# Patient Record
Sex: Female | Born: 1945
Health system: Southern US, Community
[De-identification: ages and names within clinical notes are randomized; demographics above are authoritative.]

## PROBLEM LIST (undated history)

## (undated) DIAGNOSIS — J3089 Other allergic rhinitis: Secondary | ICD-10-CM

## (undated) DIAGNOSIS — L600 Ingrowing nail: Secondary | ICD-10-CM

## (undated) DIAGNOSIS — G4730 Sleep apnea, unspecified: Secondary | ICD-10-CM

## (undated) DIAGNOSIS — R05 Cough: Secondary | ICD-10-CM

## (undated) DIAGNOSIS — K219 Gastro-esophageal reflux disease without esophagitis: Secondary | ICD-10-CM

## (undated) DIAGNOSIS — I10 Essential (primary) hypertension: Secondary | ICD-10-CM

## (undated) DIAGNOSIS — E7800 Pure hypercholesterolemia, unspecified: Secondary | ICD-10-CM

## (undated) DIAGNOSIS — I729 Aneurysm of unspecified site: Secondary | ICD-10-CM

## (undated) DIAGNOSIS — K5900 Constipation, unspecified: Secondary | ICD-10-CM

## (undated) DIAGNOSIS — M7661 Achilles tendinitis, right leg: Secondary | ICD-10-CM

## (undated) DIAGNOSIS — G459 Transient cerebral ischemic attack, unspecified: Secondary | ICD-10-CM

## (undated) DIAGNOSIS — I2510 Atherosclerotic heart disease of native coronary artery without angina pectoris: Secondary | ICD-10-CM

## (undated) DIAGNOSIS — E785 Hyperlipidemia, unspecified: Secondary | ICD-10-CM

## (undated) DIAGNOSIS — K220 Achalasia of cardia: Secondary | ICD-10-CM

## (undated) DIAGNOSIS — K449 Diaphragmatic hernia without obstruction or gangrene: Secondary | ICD-10-CM

## (undated) DIAGNOSIS — M1712 Unilateral primary osteoarthritis, left knee: Secondary | ICD-10-CM

## (undated) DIAGNOSIS — R42 Dizziness and giddiness: Secondary | ICD-10-CM

## (undated) DIAGNOSIS — D649 Anemia, unspecified: Secondary | ICD-10-CM

## (undated) DIAGNOSIS — K222 Esophageal obstruction: Secondary | ICD-10-CM

## (undated) DIAGNOSIS — R202 Paresthesia of skin: Secondary | ICD-10-CM

## (undated) DIAGNOSIS — K4090 Unilateral inguinal hernia, without obstruction or gangrene, not specified as recurrent: Secondary | ICD-10-CM

## (undated) DIAGNOSIS — R51 Headache: Secondary | ICD-10-CM

## (undated) DIAGNOSIS — G43909 Migraine, unspecified, not intractable, without status migrainosus: Secondary | ICD-10-CM

## (undated) DIAGNOSIS — B351 Tinea unguium: Secondary | ICD-10-CM

## (undated) DIAGNOSIS — J40 Bronchitis, not specified as acute or chronic: Secondary | ICD-10-CM

## (undated) DIAGNOSIS — E876 Hypokalemia: Secondary | ICD-10-CM

## (undated) DIAGNOSIS — K5790 Diverticulosis of intestine, part unspecified, without perforation or abscess without bleeding: Secondary | ICD-10-CM

## (undated) HISTORY — DX: Headache: R51

## (undated) HISTORY — DX: Migraine, unspecified, not intractable, without status migrainosus: G43.909

## (undated) HISTORY — DX: Unilateral primary osteoarthritis, left knee: M17.12

## (undated) HISTORY — DX: Tinea unguium: B35.1

## (undated) HISTORY — PX: ABDOMINAL HYSTERECTOMY: SHX81

## (undated) HISTORY — PX: CARPAL TUNNEL RELEASE: SHX101

## (undated) HISTORY — DX: Pure hypercholesterolemia, unspecified: E78.00

## (undated) HISTORY — PX: BACK SURGERY: SHX140

## (undated) HISTORY — DX: Achilles tendinitis, right leg: M76.61

## (undated) HISTORY — PX: HEMORRHOID SURGERY: SHX153

## (undated) HISTORY — DX: Essential (primary) hypertension: I10

## (undated) HISTORY — DX: Gastro-esophageal reflux disease without esophagitis: K21.9

## (undated) HISTORY — DX: Anemia, unspecified: D64.9

## (undated) HISTORY — PX: CERVICAL SPINE SURGERY: SHX589

## (undated) HISTORY — DX: Achalasia of cardia: K22.0

## (undated) HISTORY — DX: Cough: R05

## (undated) HISTORY — DX: Bronchitis, not specified as acute or chronic: J40

## (undated) HISTORY — DX: Diaphragmatic hernia without obstruction or gangrene: K44.9

## (undated) HISTORY — DX: Esophageal obstruction: K22.2

## (undated) HISTORY — DX: Unilateral inguinal hernia, without obstruction or gangrene, not specified as recurrent: K40.90

## (undated) HISTORY — DX: Aneurysm of unspecified site: I72.9

## (undated) HISTORY — DX: Atherosclerotic heart disease of native coronary artery without angina pectoris: I25.10

## (undated) HISTORY — DX: Ingrowing nail: L60.0

## (undated) HISTORY — DX: Constipation, unspecified: K59.00

## (undated) HISTORY — DX: Hyperlipidemia, unspecified: E78.5

## (undated) HISTORY — DX: Diverticulosis of intestine, part unspecified, without perforation or abscess without bleeding: K57.90

## (undated) HISTORY — DX: Paresthesia of skin: R20.2

## (undated) HISTORY — DX: Hypokalemia: E87.6

## (undated) HISTORY — DX: Other allergic rhinitis: J30.89

## (undated) HISTORY — DX: Dizziness and giddiness: R42

---

## 1997-06-21 ENCOUNTER — Encounter: Admission: RE | Admit: 1997-06-21 | Discharge: 1997-09-19 | Payer: Self-pay | Admitting: Neurosurgery

## 1997-07-27 ENCOUNTER — Inpatient Hospital Stay (HOSPITAL_COMMUNITY): Admission: RE | Admit: 1997-07-27 | Discharge: 1997-08-02 | Payer: Self-pay | Admitting: Neurosurgery

## 1997-09-27 ENCOUNTER — Ambulatory Visit (HOSPITAL_COMMUNITY): Admission: RE | Admit: 1997-09-27 | Discharge: 1997-09-27 | Payer: Self-pay | Admitting: Neurosurgery

## 1997-10-11 ENCOUNTER — Encounter: Payer: Self-pay | Admitting: Neurosurgery

## 1997-10-11 ENCOUNTER — Ambulatory Visit (HOSPITAL_COMMUNITY): Admission: RE | Admit: 1997-10-11 | Discharge: 1997-10-11 | Payer: Self-pay | Admitting: Neurosurgery

## 1999-06-14 ENCOUNTER — Encounter: Admission: RE | Admit: 1999-06-14 | Discharge: 1999-06-14 | Payer: Self-pay | Admitting: Neurosurgery

## 1999-06-14 ENCOUNTER — Encounter: Payer: Self-pay | Admitting: Neurosurgery

## 2001-04-22 ENCOUNTER — Inpatient Hospital Stay (HOSPITAL_COMMUNITY): Admission: RE | Admit: 2001-04-22 | Discharge: 2001-04-23 | Payer: Self-pay | Admitting: Neurosurgery

## 2001-04-22 ENCOUNTER — Encounter: Payer: Self-pay | Admitting: Neurosurgery

## 2003-05-24 ENCOUNTER — Encounter: Admission: RE | Admit: 2003-05-24 | Discharge: 2003-05-24 | Payer: Self-pay | Admitting: Neurosurgery

## 2003-06-15 ENCOUNTER — Encounter: Admission: RE | Admit: 2003-06-15 | Discharge: 2003-06-15 | Payer: Self-pay | Admitting: Neurosurgery

## 2009-12-27 ENCOUNTER — Ambulatory Visit (HOSPITAL_COMMUNITY): Admission: RE | Admit: 2009-12-27 | Discharge: 2009-12-28 | Payer: Self-pay | Admitting: Neurosurgery

## 2010-01-27 ENCOUNTER — Emergency Department (HOSPITAL_COMMUNITY)
Admission: EM | Admit: 2010-01-27 | Discharge: 2010-01-27 | Payer: Self-pay | Source: Home / Self Care | Admitting: Emergency Medicine

## 2010-02-02 ENCOUNTER — Ambulatory Visit (HOSPITAL_COMMUNITY)
Admission: RE | Admit: 2010-02-02 | Discharge: 2010-02-02 | Payer: Self-pay | Source: Home / Self Care | Attending: Neurosurgery | Admitting: Neurosurgery

## 2010-02-21 ENCOUNTER — Ambulatory Visit (HOSPITAL_COMMUNITY)
Admission: RE | Admit: 2010-02-21 | Discharge: 2010-02-21 | Payer: Self-pay | Source: Home / Self Care | Attending: Neurosurgery | Admitting: Neurosurgery

## 2010-02-21 LAB — BASIC METABOLIC PANEL
BUN: 11 mg/dL (ref 6–23)
CO2: 35 mEq/L — ABNORMAL HIGH (ref 19–32)
Calcium: 9.5 mg/dL (ref 8.4–10.5)
Chloride: 99 mEq/L (ref 96–112)
Creatinine, Ser: 0.8 mg/dL (ref 0.4–1.2)
GFR calc Af Amer: >60 mL/min (ref >60)
GFR calc non Af Amer: >60 mL/min (ref >60)
Glucose, Bld: 97 mg/dL (ref 70–99)
Potassium: 3.1 mEq/L — ABNORMAL LOW (ref 3.5–5.1)
Sodium: 142 mEq/L (ref 135–145)

## 2010-02-21 LAB — URINALYSIS, ROUTINE W REFLEX MICROSCOPIC
Bilirubin Urine: NEGATIVE
Hemoglobin, Urine: NEGATIVE
Ketones, ur: NEGATIVE mg/dL
Nitrite: NEGATIVE
Protein, ur: NEGATIVE mg/dL
Specific Gravity, Urine: 1.02 (ref 1.005–1.030)
Urine Glucose, Fasting: NEGATIVE mg/dL
Urobilinogen, UA: 1 mg/dL (ref 0.0–1.0)
pH: 8 (ref 5.0–8.0)

## 2010-03-10 NOTE — H&P (Signed)
  NAMEKILEA, MCCAREY              ACCOUNT NO.:  [id number]  MEDICAL RECORD NO.:  [id number]          PATIENT TYPE:  OUT  LOCATION:  MLAB                         FACILITY:  [facility]  PHYSICIAN:  Coletta Memos, M.D.     DATE OF BIRTH:  [birth year]  DATE OF ADMISSION:  02/21/2010 DATE OF DISCHARGE:  02/21/2010                             HISTORY & PHYSICAL   Ms. Redondo was never admitted.  She was supposed to undergo an operation but told me in the holding room that she was feeling better.  She is moving all extremities well.  Her physical exam neurologically was normal and intact.  Again, she was not admitted to the hospital.  She did undergo operation which was cancelled in the holding area.          ______________________________ Coletta Memos, M.D.     KC/MEDQ  D:  02/28/2010  T:  03/01/2010  Job:  [id number]  Electronically Signed by Coletta Memos M.D. on 03/10/2010 10:47:50 AM

## 2010-03-11 ENCOUNTER — Encounter: Payer: Self-pay | Admitting: Neurosurgery

## 2010-04-30 LAB — CBC
HCT: 35.9 % — ABNORMAL LOW (ref 36.0–46.0)
Hemoglobin: 11.8 g/dL — ABNORMAL LOW (ref 12.0–15.0)
MCH: 28.7 pg (ref 26.0–34.0)
MCHC: 32.9 g/dL (ref 30.0–36.0)
MCV: 87.3 fL (ref 78.0–100.0)
Platelets: 323 10*3/uL (ref 150–400)
RBC: 4.11 MIL/uL (ref 3.87–5.11)
RDW: 14.6 % (ref 11.5–15.5)
WBC: 7.4 10*3/uL (ref 4.0–10.5)

## 2010-04-30 LAB — BASIC METABOLIC PANEL
BUN: 13 mg/dL (ref 6–23)
CO2: 34 mEq/L — ABNORMAL HIGH (ref 19–32)
Calcium: 9.3 mg/dL (ref 8.4–10.5)
Chloride: 99 mEq/L (ref 96–112)
Creatinine, Ser: 0.85 mg/dL (ref 0.4–1.2)
GFR calc Af Amer: >60 mL/min (ref >60)
GFR calc non Af Amer: >60 mL/min (ref >60)
Glucose, Bld: 98 mg/dL (ref 70–99)
Potassium: 2.9 mEq/L — ABNORMAL LOW (ref 3.5–5.1)
Sodium: 140 mEq/L (ref 135–145)

## 2010-04-30 LAB — SURGICAL PCR SCREEN
MRSA, PCR: NEGATIVE
Staphylococcus aureus: NEGATIVE

## 2010-05-01 LAB — BASIC METABOLIC PANEL
BUN: 13 mg/dL (ref 6–23)
CO2: 37 mEq/L — ABNORMAL HIGH (ref 19–32)
Calcium: 9.8 mg/dL (ref 8.4–10.5)
Chloride: 97 mEq/L (ref 96–112)
Creatinine, Ser: 1.18 mg/dL (ref 0.4–1.2)
GFR calc Af Amer: 56 mL/min — ABNORMAL LOW (ref >60)
GFR calc non Af Amer: 46 mL/min — ABNORMAL LOW (ref >60)
Glucose, Bld: 92 mg/dL (ref 70–99)
Potassium: 2.7 mEq/L — CL (ref 3.5–5.1)
Sodium: 141 mEq/L (ref 135–145)

## 2010-05-01 LAB — URINALYSIS, ROUTINE W REFLEX MICROSCOPIC
Bilirubin Urine: NEGATIVE
Glucose, UA: NEGATIVE mg/dL
Hgb urine dipstick: NEGATIVE
Ketones, ur: NEGATIVE mg/dL
Nitrite: NEGATIVE
Protein, ur: NEGATIVE mg/dL
Specific Gravity, Urine: 1.021 (ref 1.005–1.030)
Urobilinogen, UA: 1 mg/dL (ref 0.0–1.0)
pH: 7 (ref 5.0–8.0)

## 2010-05-01 LAB — CBC
HCT: 36.3 % (ref 36.0–46.0)
Hemoglobin: 11.9 g/dL — ABNORMAL LOW (ref 12.0–15.0)
MCH: 29 pg (ref 26.0–34.0)
MCHC: 32.8 g/dL (ref 30.0–36.0)
MCV: 88.3 fL (ref 78.0–100.0)
Platelets: 338 10*3/uL (ref 150–400)
RBC: 4.11 MIL/uL (ref 3.87–5.11)
RDW: 14.4 % (ref 11.5–15.5)
WBC: 7.3 10*3/uL (ref 4.0–10.5)

## 2010-05-01 LAB — POCT I-STAT 4, (NA,K, GLUC, HGB,HCT)
Glucose, Bld: 93 mg/dL (ref 70–99)
HCT: 35 % — ABNORMAL LOW (ref 36.0–46.0)
Hemoglobin: 11.9 g/dL — ABNORMAL LOW (ref 12.0–15.0)
Potassium: 2.4 mEq/L — CL (ref 3.5–5.1)
Sodium: 141 mEq/L (ref 135–145)

## 2010-05-01 LAB — POTASSIUM: Potassium: 3.2 mEq/L — ABNORMAL LOW (ref 3.5–5.1)

## 2010-05-01 LAB — SURGICAL PCR SCREEN
MRSA, PCR: NEGATIVE
Staphylococcus aureus: NEGATIVE

## 2010-07-06 NOTE — Op Note (Signed)
[place]. [facility]  Patient:    Stephanie Parks, Stephanie Parks Visit Number: [id number] MRN: [id number]          Service Type: SUR Location: 3000 3021 [id number] Attending Physician:  Coletta Memos Dictated by:   Mena Goes. Franky Macho, M.D. Proc. Date: 04/22/01 Admit Date:  04/22/2001 Discharge Date: 04/23/2001                             Operative Report  PREOPERATIVE DIAGNOSIS:  Cervical stenosis, C4-5, C5-6.  Cervical spondylosis without myelopathy, cervical radiculopathy.  POSTOPERATIVE DIAGNOSIS:  Cervical stenosis, C4-5, C5-6.  Cervical spondylosis without myelopathy, cervical radiculopathy.  OPERATION PERFORMED:  Anterior cervical diskectomy C4-5, C5-6.  Arthrodesis C4 to C6 with anterior plating allograft by 2 cm tutogen graft, small stature Synthes plate, 34 mm used.  12 mm screws at C4, 14 mm screws at C5 and C6.  SURGEON:  Kyle L. Franky Macho, M.D.  ASSISTANT:  Hewitt Shorts, M.D.  COMPLICATIONS:  None.  ANESTHESIA:  General endotracheal.  INDICATIONS FOR PROCEDURE:  The patient is a 65 year old woman who presented with neck pain.  She had significant cervical stenosis, kyphotic deformity and foraminal stenosis at C4-5 and C5-6 causing compression of the C5 and C6 nerve roots.  I recommended and she agreed to undergo operative decompression.  DESCRIPTION OF PROCEDURE:  The patient was brought to the operating room, intubated and placed under general anesthesia without difficulty.  Her head was placed in neutral position on a horseshoe head rest with 10 pounds of traction applied via chin strap.  The neck was prepped and she was draped in sterile fashion. I infiltrated 6 cc of 0.5% lidocaine 1:200,000 strength epinephrine starting at the midline extending to the medial border of the left sternocleidomastoid muscle just at the thyroid cartilage.  I made the incision after draping the patient took this down to the platysma.  Opened that with Metzenbaum  scissors and dissected both rostrally and caudally.  I identified the omohyoid muscle and retracted that medially.  I identified the anterior cervical spine and used the Kitner to remove soft tissue.  I placed self-retaining retractors.  X-ray was taken after the needle was placed and showed it to be at the C5-6 space.  Using that as a guide, I then opened the C5-6 space.  I then placed distraction pins, one at C5, one at C6, brought the microscope into the field and proceeded with finishing the diskectomy and removing the osteophytes.  That was done with the use of curets and pituitary rongeurs for the diskectomy.  I used the high speed air drill to drill away a significant amount of spondylitic bone posteriorly at both C5 and C6.   I did this until I was able to decompress the nerve roots fully.  After finishing at C5-6, I then placed a 6 mm tutogen graft there.  I removed the distraction pin at C6 and then placed that in C4.  I distracted the space at C4-5, readjusted my retractor and completed the diskectomy using pituitary rongeurs and curets. I then used a high speed air drill to remove again a significant amount of osteophytes posteriorly  at C4 and 5.  Again, I decompressed the nerve roots bilaterally with no problem.  I placed another 6 mm tutogen graft.  I then removed the distraction pins.  I then placed a 34 mm small stature Synthes plate with the assistance of Dr. Shirlean Kelly and  we placed six screws, two in C5, two in C4 and two in C6.  This was done by drilling and then placing the screws and tapping also.  X-ray was taken and showed the plate to be in excellent position as were the grafts.  The wound was irrigated.  I then closed the wound in layered fashion using Vicryl sutures reapproximating the platysma.  Skin reapproximated also.  Dermabond used for sterile dressing. Dictated by:   Mena Goes. Franky Macho, M.D. Attending Physician:  Coletta Memos DD:  04/23/01 TD:   04/23/01 Job: [id number]

## 2010-07-06 NOTE — H&P (Signed)
[place]. [facility]  Patient:    Parks, Stephanie Visit Number: [id number] MRN: [id number]          Service Type: SUR Location: 3000 3021 01 Attending Physician:  Coletta Memos Dictated by:   Mena Goes. Franky Macho, M.D. Admit Date:  04/22/2001 Discharge Date: 04/23/2001                           History and Physical  HISTORY OF PRESENT ILLNESS:  Stephanie Parks is a patient well-known to me as I have taken care of her lower spine for some time.  She is a 65 year old woman whom I took care of for a recurrent herniated disk at L4-5.  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  She underwent an MRI and what it showed was a severely degenerated cervical spine at C4-5 and 5-6 with cord compression, canal stenosis, and foraminal stenosis.  She also had a kyphotic deformity at C4-5.  The pain was progressing in her upper extremities along with some tingling in her hands.  She had a significant amount of neck pain and also had headaches associated with this.  Ms. Shimko currently is disabled.  FAMILY HISTORY:  Mother is 24 and in good health.  Father is deceased. Hypertension is present in the family history.  PAST MEDICAL HISTORY:  She had back surgery in January 1999 and June 1999, hemorrhoid surgery in September 1980.  ALLERGIES:  SULFA MEDICATIONS.  SOCIAL HISTORY:  She is 5 feet 6 inches tall, weighs 180 pounds.  She does not drink alcohol, use illicit drugs, nor does she smoke.  REVIEW OF SYSTEMS:  Currently she reports night sweats, eyeglasses, nasal congestion, sinus problems, leg pain with walking, left arm weakness, left arm pain, right leg pain, neck pain, back pain, change in her bowel habits.  She denies allergic, hematologic, endocrine, psychiatric, neurological, skin, genitourinary, respiratory problems.  CURRENT MEDICATIONS:  Zyrtec D and  Naproxen.  PHYSICAL EXAMINATION:  GENERAL:  She is alert, oriented x 4, and answering all questions appropriately.  Memory, language, attention span, and fund of knowledge are normal.  She is well kempt and in no distress.  NEUROLOGIC:  Pupils are equal, round and reactive to light.  She has symmetric facial sensation and movement.  Hearing intact to voice bilaterally.  Shoulder shrug is normal.  Tongue protrudes in the midline.  She has 5/5 strength in the upper extremities.  Muscle tone, bulk, and coordination are normal. Reflexes are intact, 2+ at the biceps, triceps, brachioradialis.  She has a normal gait.  EXTREMITIES:  Pulses good at the wrists and feet bilaterally.  NECK:  No cervical masses or bruits.  LUNGS:  Clear.  LABORATORY DATA:  MRI findings are reviewed in the initial paragraph.  ASSESSMENT AND PLAN:  I feel that [name] has significant cervical stenosis and is without myelopathy, has neck pain and arm pain - all of which can easily be explained by the findings on MRI.  I therefore recommended that she undergo an anterior cervical diskectomy at C4-5 and 5-6 via an anterior approach to decompress the nerve roots and the spinal canal.   Risks of the procedure including bleeding, infection, no pain relief, bowel/bladder dysfunction, need for further surgery were explained.  She understands and wishes to proceed. Dictated by:   Mena Goes. Cabbell,  M.D. Attending Physician:  Coletta Memos DD:  04/23/01 TD:  04/23/01 Job: [id number]

## 2010-11-29 ENCOUNTER — Emergency Department (HOSPITAL_BASED_OUTPATIENT_CLINIC_OR_DEPARTMENT_OTHER)
Admission: EM | Admit: 2010-11-29 | Discharge: 2010-11-29 | Disposition: A | Discharge status: Home / Self Care | Payer: BC Managed Care – PPO | Attending: Emergency Medicine | Admitting: Emergency Medicine

## 2010-11-29 ENCOUNTER — Encounter: Payer: Self-pay | Admitting: *Deleted

## 2010-11-29 DIAGNOSIS — S30860A Insect bite (nonvenomous) of lower back and pelvis, initial encounter: Secondary | ICD-10-CM | POA: Insufficient documentation

## 2010-11-29 DIAGNOSIS — W57XXXA Bitten or stung by nonvenomous insect and other nonvenomous arthropods, initial encounter: Secondary | ICD-10-CM | POA: Insufficient documentation

## 2010-11-29 DIAGNOSIS — L0390 Cellulitis, unspecified: Secondary | ICD-10-CM

## 2010-11-29 DIAGNOSIS — Z79899 Other long term (current) drug therapy: Secondary | ICD-10-CM | POA: Insufficient documentation

## 2010-11-29 DIAGNOSIS — I10 Essential (primary) hypertension: Secondary | ICD-10-CM | POA: Insufficient documentation

## 2010-11-29 HISTORY — DX: Essential (primary) hypertension: I10

## 2010-11-29 MED ORDER — HYDROCODONE-ACETAMINOPHEN 5-325 MG PO TABS: 2.0000 | ORAL_TABLET | ORAL | Status: AC | PRN

## 2010-11-29 MED ORDER — IBUPROFEN 400 MG PO TABS
600.0000 mg | ORAL_TABLET | Freq: Once | ORAL | Status: AC
  Administered 2010-11-29: 600 mg via ORAL
  Filled 2010-11-29: qty 1

## 2010-11-29 MED ORDER — CEPHALEXIN 500 MG PO CAPS: 500.0000 mg | ORAL_CAPSULE | Freq: Four times a day (QID) | ORAL | Status: AC

## 2010-11-29 NOTE — ED Provider Notes (Signed)
History     CSN: [id number] Arrival date & time: 11/29/2010  7:24 AM  Chief Complaint  Patient presents with  . Insect Bite     HPI 65 year-old female presents with right flank pain. Patient states that 5 days ago she sustained an insect bite to the area. She was seen by her primary care doctor 2 days later and given "a shot of antibiotics "and sent home on doxycycline. She complains of continued pain and swelling. She states that the redness has extended. She denies fever, chills. She denies abdominal pain, nausea, vomiting. Denies history of abscess.    Past Medical History  Diagnosis Date  . Hypertension     History reviewed. No pertinent past surgical history.  History reviewed. No pertinent family history.  History  Substance Use Topics  . Smoking status: Never Smoker   . Smokeless tobacco: Not on file  . Alcohol Use: No    OB History    Grav Para Term Preterm Abortions TAB SAB Ect Mult Living                  Review of Systems Negative except as noted in history of present illness Allergies  Sulfa antibiotics  Home Medications   Current Outpatient Rx  Name Route Sig Dispense Refill  . ASPIRIN 81 MG PO CHEW Oral Chew 81 mg by mouth daily.      Marland Kitchen DOXYCYCLINE MONOHYDRATE 100 MG PO TABS Oral Take 100 mg by mouth 2 (two) times daily.      Marland Kitchen LOSARTAN POTASSIUM-HCTZ 100-25 MG PO TABS Oral Take 1 tablet by mouth daily.      Marland Kitchen LOVASTATIN 10 MG PO TABS Oral Take 10 mg by mouth at bedtime.      Marland Kitchen NORTRIPTYLINE HCL 10 MG PO CAPS Oral Take 10 mg by mouth at bedtime.      Marland Kitchen POTASSIUM CHLORIDE 10 MEQ PO TBCR Oral Take 10 mEq by mouth daily.      . CEPHALEXIN 500 MG PO CAPS Oral Take 1 capsule (500 mg total) by mouth 4 (four) times daily. 40 capsule 0  . HYDROCODONE-ACETAMINOPHEN 5-325 MG PO TABS Oral Take 2 tablets by mouth every 4 (four) hours as needed for pain. 10 tablet 0    BP 103/55  Pulse 90  Temp(Src) 98.4 F (36.9 C) (Oral)  Resp 20  SpO2 99%  Physical  Exam  Nursing note and vitals reviewed. Constitutional: She is oriented to person, place, and time. She appears well-developed.  HENT:  Head: Atraumatic.  Mouth/Throat: Oropharynx is clear and moist.  Eyes: Conjunctivae and EOM are normal. Pupils are equal, round, and reactive to light.  Neck: Normal range of motion. Neck supple.  Cardiovascular: Normal rate, regular rhythm, normal heart sounds and intact distal pulses.   Pulmonary/Chest: Effort normal and breath sounds normal. No respiratory distress. She has no wheezes. She has no rales.  Abdominal: Soft. She exhibits no distension. There is no tenderness. There is no rebound and no guarding.  Musculoskeletal: Normal range of motion.  Neurological: She is alert and oriented to person, place, and time.  Skin: Skin is warm and dry. There is erythema.       Right flank/back with 5x4 inches of erythema and induration. Slightly warm. Blanches. Positive tenderness to palpation there is no fluctuance. There is a pinpoint healing lesion ?puncture  Psychiatric: She has a normal mood and affect.    ED Course  Procedures (including critical care time)  Labs Reviewed -  No data to display No results found.   1. Cellulitis   2. Insect bite       MDM  Well appearing 65 year old female with cellulitis versus inflammatory reaction from insect bite. Is no fluctuance and no abscess to drain at this time. Patient is on doxycycline will cover with Keflex. Ibuprofen, Vicodin. Will have patient follow up with her primary care Dr for recheck in 2 days.  Stefano Gaul, MD         Forbes Cellar, MD 11/29/10 [phone]

## 2010-11-29 NOTE — ED Notes (Signed)
Pt amb to room 4 with slow, steady gait in nad. Pt reports spider bite x Sunday to her right side, seen by her pcp Tuesday and given "antibiotic shot". Pinpoint open area noted to right lower side, with surrounding edema and erythema, hot to touch, tender. Pt denies fevers.

## 2010-12-30 ENCOUNTER — Encounter (HOSPITAL_BASED_OUTPATIENT_CLINIC_OR_DEPARTMENT_OTHER): Payer: Self-pay | Admitting: *Deleted

## 2010-12-30 ENCOUNTER — Emergency Department (HOSPITAL_BASED_OUTPATIENT_CLINIC_OR_DEPARTMENT_OTHER)
Admission: EM | Admit: 2010-12-30 | Discharge: 2010-12-30 | Disposition: A | Discharge status: Home / Self Care | Payer: BC Managed Care – PPO | Attending: Emergency Medicine | Admitting: Emergency Medicine

## 2010-12-30 ENCOUNTER — Emergency Department (INDEPENDENT_AMBULATORY_CARE_PROVIDER_SITE_OTHER): Payer: BC Managed Care – PPO

## 2010-12-30 DIAGNOSIS — R51 Headache: Secondary | ICD-10-CM | POA: Insufficient documentation

## 2010-12-30 DIAGNOSIS — J069 Acute upper respiratory infection, unspecified: Secondary | ICD-10-CM

## 2010-12-30 DIAGNOSIS — R05 Cough: Secondary | ICD-10-CM

## 2010-12-30 DIAGNOSIS — I10 Essential (primary) hypertension: Secondary | ICD-10-CM | POA: Insufficient documentation

## 2010-12-30 DIAGNOSIS — Z79899 Other long term (current) drug therapy: Secondary | ICD-10-CM | POA: Insufficient documentation

## 2010-12-30 NOTE — ED Notes (Signed)
Pt states she has had cough, congestion, headache sinus pain since Thursday. Hx of sinus infections

## 2010-12-30 NOTE — ED Provider Notes (Signed)
History  Scribed for Hurman Horn, MD, the patient was seen in room MH04. This chart was scribed by Hillery Hunter.   CSN: [id number] Arrival date & time: 12/30/2010  8:48 PM   First MD Initiated Contact with Patient 12/30/10 2102      Chief Complaint  Patient presents with  . Facial Pain    The history is provided by the patient.    Stephanie Parks is a 65 y.o. female who presents to the Emergency Department complaining of three days of nasal congestion, rhinorrhea, sore throat, coughing and chills. She describes these symptoms as constant. She denies any modifying factors. She denies fever, shortness of breath, myalgias, vomiting, diarrhea or rash.    Past Medical History  Diagnosis Date  . Hypertension     History reviewed. No pertinent past surgical history.  History reviewed. No pertinent family history.  History  Substance Use Topics  . Smoking status: Never Smoker   . Smokeless tobacco: Not on file  . Alcohol Use: No    Review of Systems  Constitutional: Positive for chills. Negative for fever.  HENT: Positive for congestion and sore throat.   Eyes: Positive for discharge (watery eyes).  Respiratory: Positive for cough. Negative for shortness of breath.   Cardiovascular: Negative for chest pain.  Gastrointestinal: Negative for nausea, vomiting, abdominal pain and diarrhea.  Musculoskeletal: Positive for myalgias.    Allergies  Shellfish allergy and Sulfa antibiotics  Home Medications   Current Outpatient Rx  Name Route Sig Dispense Refill  . ASPIRIN 81 MG PO CHEW Oral Chew 81 mg by mouth daily.      Marland Kitchen LOSARTAN POTASSIUM-HCTZ 100-25 MG PO TABS Oral Take 1 tablet by mouth daily.      Marland Kitchen LOVASTATIN 10 MG PO TABS Oral Take 10 mg by mouth at bedtime.      Marland Kitchen NORTRIPTYLINE HCL 10 MG PO CAPS Oral Take 10 mg by mouth at bedtime.      Marland Kitchen POTASSIUM CHLORIDE 10 MEQ PO TBCR Oral Take 10 mEq by mouth daily.      Marland Kitchen DOXYCYCLINE MONOHYDRATE 100 MG PO TABS  Oral Take 100 mg by mouth 2 (two) times daily.        BP 142/69  Pulse 91  Temp(Src) 98.8 F (37.1 C) (Oral)  Resp 19  Ht 5\' 6"  (1.676 m)  Wt 198 lb (89.812 kg)  BMI 31.96 kg/m2  SpO2 100%  Physical Exam  Nursing note and vitals reviewed. Constitutional:       Awake, alert, nontoxic appearance.  HENT:  Head: Atraumatic.       Clear, watery discharge from nose  Eyes: Right eye exhibits no discharge. Left eye exhibits no discharge.  Neck: Neck supple.  Cardiovascular: Normal rate, regular rhythm and normal heart sounds.   No murmur heard. Pulmonary/Chest: Effort normal. No respiratory distress. She has no wheezes. She has no rales. She exhibits no tenderness.  Abdominal: Soft. There is no tenderness. There is no rebound.  Musculoskeletal: She exhibits no tenderness.       Baseline ROM, no obvious new focal weakness.  Neurological:       Mental status and motor strength appears baseline for patient and situation.  Skin: No rash noted.  Psychiatric: She has a normal mood and affect.    ED Course  Procedures    Dg Chest 2 View  12/30/2010  *RADIOLOGY REPORT*  Clinical Data: 65 year old female with cough.  CHEST - 2 VIEW  Comparison: 12/26/2009.  Findings: Lower cervical ACDF hardware re-identified.  Stable lung volumes.  Cardiac size and mediastinal contours are within normal limits.  Visualized tracheal air column is within normal limits. No pneumothorax, pulmonary edema, pleural effusion or acute pulmonary opacity. No acute osseous abnormality identified.  IMPRESSION: No acute cardiopulmonary abnormality.  Original Report Authenticated By: Harley Hallmark, M.D.     OTHER DATA REVIEWED: Nursing notes, vital signs required  DIAGNOSTIC STUDIES: Oxygen Saturation is 100% on room air, normal by my interpretation.     ED COURSE / COORDINATION OF CARE: 21:19. Ordered CXR 2-view r/o infiltrate.    MDM  I doubt any other EMC precluding discharge at this time including, but  not necessarily limited to the following:SBI.   No diagnosis found.    I personally performed the services described in this documentation, which was scribed in my presence. The recorded information has been reviewed and considered.    Hurman Horn, MD 12/31/10 0010

## 2010-12-30 NOTE — ED Notes (Signed)
Transported to xray 

## 2011-04-02 ENCOUNTER — Ambulatory Visit: Payer: BC Managed Care – PPO | Admitting: Cardiovascular Disease

## 2011-04-04 ENCOUNTER — Encounter: Payer: Self-pay | Admitting: Cardiovascular Disease

## 2011-04-04 ENCOUNTER — Ambulatory Visit (INDEPENDENT_AMBULATORY_CARE_PROVIDER_SITE_OTHER): Payer: BC Managed Care – PPO | Admitting: Cardiovascular Disease

## 2011-04-04 DIAGNOSIS — R079 Chest pain, unspecified: Secondary | ICD-10-CM | POA: Insufficient documentation

## 2011-04-04 NOTE — Progress Notes (Signed)
History of Present Illness: 66 yo AAF with history of HTN, HLD, hiatal hernia, GERD who is here today as a self referral for evaluation of chest pain. She tells me that she has been having episodes of dizziness and feeling lightheaded. This occurs at rest or with exertion. Her breathing has been ok. She also describes aches in the left side of her chest. This lasts for a few seconds. No associated diaphoresis, nausea, SOB. She also describes weakness and aching in her left arm. She has been exercising 4 days per week with no chest pain. No orthopnea, PND, LE edema. She had been followed in the past by Dr. Bary Castilla in [facility]. She tells me that she had a heart catheterization in 2003 at [facility]. She was told there was minor plaque disease.   Primary Care Physician:  Kirt Boys   Last Lipid Profile:  Past Medical History  Diagnosis Date  . Hypertension   . GERD (gastroesophageal reflux disease)   . Hiatal hernia   . Migraine headache     Past Surgical History  Procedure Date  . Back surgery     x 3  . Cervical spine surgery   . Abdominal hysterectomy   . Carpal tunnel release     Current Outpatient Prescriptions  Medication Sig Dispense Refill  . aspirin 81 MG chewable tablet Chew 81 mg by mouth daily.        . Diclofenac Potassium (CAMBIA) 50 MG PACK Take by mouth. As needed oral solution for miagraines      . losartan-hydrochlorothiazide (HYZAAR) 100-25 MG per tablet Take 1 tablet by mouth daily.        Marland Kitchen lovastatin (MEVACOR) 20 MG tablet Take 20 mg by mouth at bedtime.      . nortriptyline (PAMELOR) 25 MG capsule Take 25 mg by mouth at bedtime.      . potassium chloride (KLOR-CON) 10 MEQ CR tablet Take 10 mEq by mouth 2 (two) times daily.         Allergies  Allergen Reactions  . Shellfish Allergy Swelling  . Sulfa Antibiotics Hives    History   Social History  . Marital Status: Divorced    Spouse Name: N/A    Number of Children: 2  . Years of  Education: N/A   Occupational History  . 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 Smoking status: Former Smoker -- 0.5 packs/day for 20 years    Types: Cigarettes    Quit date: 02/19/1984  . Smokeless tobacco: Not on file  . Alcohol Use: No  . Drug Use: No  . Sexually Active: Not on file   Other Topics Concern  . Not on file   Social History Narrative  . No narrative on file    Family History  Problem Relation Age of Onset  . Heart attack Father 33    Review of Systems:  As stated in the HPI and otherwise negative.   BP 133/74  Pulse 74  Ht 5\' 6"  (1.676 m)  Wt 198 lb (89.812 kg)  BMI 31.96 kg/m2  Physical Examination: General: Well developed, well nourished, NAD HEENT: OP clear, mucus membranes moist SKIN: warm, dry. No rashes. Neuro: No focal deficits Musculoskeletal: Muscle strength 5/5 all ext Psychiatric: Mood and affect normal Neck: No JVD, no carotid bruits, no thyromegaly, no lymphadenopathy. Lungs:Clear bilaterally, no wheezes, rhonci, crackles Cardiovascular: Regular rate and rhythm. No murmurs, gallops or rubs. Abdomen:Soft. Bowel sounds  present. Non-tender.  Extremities: No lower extremity edema. Pulses are 2 + in the bilateral DP/PT.  EKG: NSR, rate 70 bpm.

## 2011-04-04 NOTE — Patient Instructions (Signed)
Your physician has requested that you have an echocardiogram. Echocardiography is a painless test that uses sound waves to create images of your heart. It provides your doctor with information about the size and shape of your heart and how well your heart's chambers and valves are working. This procedure takes approximately one hour. There are no restrictions for this procedure.   Your physician has requested that you have an exercise tolerance test. For further information please visit [email or web address] Please also follow instruction sheet, as given.  You have been referred to  Dr. Juanda Chance at [facility]

## 2011-04-04 NOTE — Assessment & Plan Note (Signed)
Her chest pain is atypical. She has no exertional chest pain. I suspect this is non-cardiac but given history of mild CAD by cath 2003 will arrange exercise treadmill stress test. I will also arrange an echocardiogram to assess LV function and exclude structural heart disease.

## 2011-04-10 ENCOUNTER — Ambulatory Visit: Payer: BC Managed Care – PPO | Admitting: Cardiovascular Disease

## 2011-04-12 ENCOUNTER — Ambulatory Visit: Payer: BC Managed Care – PPO | Admitting: Cardiovascular Disease

## 2011-04-16 ENCOUNTER — Ambulatory Visit (HOSPITAL_COMMUNITY): Payer: BC Managed Care – PPO | Attending: Cardiovascular Disease

## 2011-04-16 ENCOUNTER — Ambulatory Visit: Payer: BC Managed Care – PPO | Admitting: Cardiovascular Disease

## 2011-04-16 ENCOUNTER — Other Ambulatory Visit: Payer: Self-pay

## 2011-04-16 DIAGNOSIS — R072 Precordial pain: Secondary | ICD-10-CM | POA: Insufficient documentation

## 2011-04-16 DIAGNOSIS — E785 Hyperlipidemia, unspecified: Secondary | ICD-10-CM | POA: Insufficient documentation

## 2011-04-16 DIAGNOSIS — R42 Dizziness and giddiness: Secondary | ICD-10-CM | POA: Insufficient documentation

## 2011-04-16 DIAGNOSIS — I059 Rheumatic mitral valve disease, unspecified: Secondary | ICD-10-CM | POA: Insufficient documentation

## 2011-04-16 DIAGNOSIS — I379 Nonrheumatic pulmonary valve disorder, unspecified: Secondary | ICD-10-CM | POA: Insufficient documentation

## 2011-04-16 DIAGNOSIS — I079 Rheumatic tricuspid valve disease, unspecified: Secondary | ICD-10-CM | POA: Insufficient documentation

## 2011-04-16 DIAGNOSIS — I10 Essential (primary) hypertension: Secondary | ICD-10-CM | POA: Insufficient documentation

## 2011-04-18 ENCOUNTER — Encounter: Payer: Self-pay | Admitting: Internal Medicine

## 2011-04-18 ENCOUNTER — Ambulatory Visit (INDEPENDENT_AMBULATORY_CARE_PROVIDER_SITE_OTHER): Payer: BC Managed Care – PPO | Admitting: Physician Assistant

## 2011-04-18 DIAGNOSIS — R079 Chest pain, unspecified: Secondary | ICD-10-CM

## 2011-04-18 NOTE — Progress Notes (Signed)
Exercise Treadmill Test  Pre-Exercise Testing Evaluation Rhythm: normal sinus  Rate: 68   PR:  .17 QRS:  .08  QT:  .38 QTc: .40     Test  Exercise Tolerance Test Ordering MD: Melene Muller, MD  Interpreting MD:  Tereso Newcomer PA-C  Unique Test No: 1  Treadmill:  1  Indication for ETT: chest pain - rule out ischemia  Contraindication to ETT: No   Stress Modality: exercise - treadmill  Cardiac Imaging Performed: non   Protocol: standard Bruce - maximal  Max BP:  195/67  Max MPHR (bpm):  155 85% MPR (bpm):  131  MPHR obtained (bpm):  157 % MPHR obtained:  101%  Reached 85% MPHR (min:sec):  4:36 Total Exercise Time (min-sec):  7:08  Workload in METS:  8.7 Borg Scale: 15  Reason ETT Terminated:  patient's desire to stop    ST Segment Analysis At Rest: normal ST segments - no evidence of significant ST depression With Exercise: non-specific ST changes  Other Information Arrhythmia:  No Angina during ETT:  absent (0) Quality of ETT:  diagnostic  ETT Interpretation:  normal - no evidence of ischemia by ST analysis  Comments: Good exercise tolerance. No chest pain. Normal BP response to exercise. No ST-T changes to suggest ischemia.   Recommendations: Follow up with Dr. Verne Carrow as directed. Tereso Newcomer, PA-C  2:47 PM 04/18/2011

## 2011-04-19 ENCOUNTER — Encounter: Payer: Self-pay | Admitting: Internal Medicine

## 2011-04-19 ENCOUNTER — Ambulatory Visit (INDEPENDENT_AMBULATORY_CARE_PROVIDER_SITE_OTHER): Payer: BC Managed Care – PPO | Admitting: Internal Medicine

## 2011-04-19 DIAGNOSIS — K219 Gastro-esophageal reflux disease without esophagitis: Secondary | ICD-10-CM | POA: Insufficient documentation

## 2011-04-19 DIAGNOSIS — Z1211 Encounter for screening for malignant neoplasm of colon: Secondary | ICD-10-CM

## 2011-04-19 DIAGNOSIS — I10 Essential (primary) hypertension: Secondary | ICD-10-CM | POA: Insufficient documentation

## 2011-04-19 DIAGNOSIS — R1013 Epigastric pain: Secondary | ICD-10-CM

## 2011-04-19 DIAGNOSIS — E785 Hyperlipidemia, unspecified: Secondary | ICD-10-CM | POA: Insufficient documentation

## 2011-04-19 MED ORDER — ESOMEPRAZOLE MAGNESIUM 40 MG PO CPDR: 40.0000 mg | DELAYED_RELEASE_CAPSULE | Freq: Every day | ORAL | Status: DC

## 2011-04-19 MED ORDER — PEG-KCL-NACL-NASULF-NA ASC-C 100 G PO SOLR: 1.0000 | Freq: Once | ORAL | Status: DC

## 2011-04-19 NOTE — Patient Instructions (Signed)
You have been scheduled for a colonoscopy/Endoscopy. Please follow written instructions given to you at your visit today.  Please pick up your prep kit at the pharmacy within the next 1-3 days.   We have sent the following medications to your pharmacy for you to pick up at your convenience: Moviprep and Nexium

## 2011-04-19 NOTE — Progress Notes (Signed)
Subjective:    Patient ID: Stephanie Parks, female    DOB: 27-Aug-1945, 66 y.o.   MRN: [id number]  HPI Stephanie Parks is a 66 yo female with PMH of hypertension, hyperlipidemia, migraines, and GERD who is seen in consultation at the request of Dr. Montez Morita for evaluation of epigastric pain and bloating. The patient reports earning epigastric pain which can occur anytime of day, and is not necessarily related to eating. This is been occurring on and off for about 2-3 months but worse for the last 3-4 weeks. She reports frequent belching. She occasionally feels a burning pain in her mid back. She denies nausea or vomiting. No dysphagia or odynophagia. Reports a good appetite with no weight loss. When asked about heartburn, she reports she has not specifically no at this means. She she does report occasional water brash. She reports dealing with constipation from time to time, and for this she will occasionally use castor oil. She denies diarrhea. No bright red blood per rectum or melena. No fevers or chills.  She reports a prior upper endoscopy and colonoscopy which he thinks was greater than 10 years ago at [facility]. She does not remember the practice or performing Dr. She does over previous to taking pantoprazole and ranitidine but is unsure how these work as it is been some time.  Review of Systems As per history of present illness, otherwise negative  Past Medical History  Diagnosis Date  . Hypertension   . GERD (gastroesophageal reflux disease)   . Hiatal hernia   . Migraine headache   . Hyperlipidemia    Current Outpatient Prescriptions  Medication Sig Dispense Refill  . aspirin 81 MG chewable tablet Chew 81 mg by mouth daily.        . Diclofenac Potassium (CAMBIA) 50 MG PACK Take by mouth. As needed oral solution for miagraines      . losartan-hydrochlorothiazide (HYZAAR) 100-25 MG per tablet Take 1 tablet by mouth daily.        Marland Kitchen lovastatin (MEVACOR) 20 MG tablet Take 20 mg by mouth at  bedtime.      . nortriptyline (PAMELOR) 25 MG capsule Take 25 mg by mouth at bedtime.      . potassium chloride (KLOR-CON) 10 MEQ CR tablet Take 10 mEq by mouth 2 (two) times daily.       Marland Kitchen esomeprazole (NEXIUM) 40 MG capsule Take 1 capsule (40 mg total) by mouth daily.  30 capsule  3  . peg 3350 powder (MOVIPREP) 100 G SOLR Take 1 kit (100 g total) by mouth once.  1 kit  0   Allergies  Allergen Reactions  . Shellfish Allergy Swelling  . Sulfa Antibiotics Hives   Family History  Problem Relation Age of Onset  . Heart attack Father 77  . Stomach cancer Maternal Grandmother     Social History  . Marital Status: Divorced    Number of Children: 2   Occupational History  . 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 Smoking status: Former Smoker -- 0.5 packs/day for 20 years    Types: Cigarettes    Quit date: 02/19/1984  . Smokeless tobacco: Never Used  . Alcohol Use: No  . Drug Use: No      Objective:   Physical Exam BP 136/68  Pulse 88  Ht 5\' 6"  (1.676 m)  Wt 199 lb (90.266 kg)  BMI 32.12 kg/m2 Constitutional: Well-developed and well-nourished. No distress. HEENT: Normocephalic and atraumatic. Oropharynx is  clear and moist. No oropharyngeal exudate. Conjunctivae are normal. Pupils are equal round and reactive to light. No scleral icterus. Neck: Neck supple. Trachea midline. Cardiovascular: Normal rate, regular rhythm and intact distal pulses. No M/R/G Pulmonary/chest: Effort normal and breath sounds normal. No wheezing, rales or rhonchi. Abdominal: Soft, mild epigastric tenderness without rebound or guarding, nondistended. Bowel sounds active throughout. There are no masses palpable. No hepatosplenomegaly. Extremities: no clubbing, cyanosis, or edema Lymphadenopathy: No cervical adenopathy noted. Neurological: Alert and oriented to person place and time. Skin: Skin is warm and dry. No rashes noted. Psychiatric: Normal mood and affect. Behavior is normal.       Assessment & Plan:  66 yo female with PMH of hypertension, hyperlipidemia, migraines, and GERD who is seen in consultation at the request of Dr. Montez Morita for evaluation of epigastric pain and bloating.  1. Epigastric pain/dyspepsia -- the patient's upper GI symptoms are most consistent with dyspepsia/acid peptic disease. Given her age I recommended upper endoscopy for further evaluation. In the meantime I will start her on daily PPI, with Nexium 40 mg daily. I've advised she take this 30 minutes to one hour before her first meal.  We discussed upper endoscopy including the risks and benefits and she is agreeable to proceed. If the medication fails to help and her upper endoscopy is unrevealing, then we should consider further evaluation with abdominal ultrasound to evaluate her gallbladder.  2. CRC screening -- the patient is average risk for colorectal cancer screening, and has had one colonoscopy, reportedly negative, about 10 years ago. She is due to have repeat screening and therefore will be scheduled for screening colonoscopy to occur with her upper endoscopy. We discussed the risks and benefits of colonoscopy and she is agreeable to proceed.  Further recommendations after procedures

## 2011-04-22 ENCOUNTER — Encounter: Payer: Self-pay | Admitting: Internal Medicine

## 2011-04-24 ENCOUNTER — Telehealth: Payer: Self-pay | Admitting: Internal Medicine

## 2011-04-25 ENCOUNTER — Telehealth: Payer: Self-pay | Admitting: Gastroenterology

## 2011-04-25 ENCOUNTER — Other Ambulatory Visit: Payer: Self-pay | Admitting: Gastroenterology

## 2011-04-25 MED ORDER — OMEPRAZOLE 40 MG PO CPDR: 40.0000 mg | DELAYED_RELEASE_CAPSULE | Freq: Every day | ORAL | Status: DC

## 2011-04-25 NOTE — Telephone Encounter (Signed)
lvm for pt. Regarding Rx for Omeprazole that was sent in to her pharmacy.

## 2011-04-25 NOTE — Telephone Encounter (Signed)
lvm for pt regarding her Rx that was sent into her Pharmacy.

## 2011-05-07 ENCOUNTER — Encounter: Payer: Self-pay | Admitting: Internal Medicine

## 2011-05-07 ENCOUNTER — Ambulatory Visit (AMBULATORY_SURGERY_CENTER): Payer: BC Managed Care – PPO | Admitting: Internal Medicine

## 2011-05-07 VITALS — BP 146/72 | HR 74 | Temp 97.3°F | Resp 15 | Ht 66.0 in | Wt 199.0 lb

## 2011-05-07 DIAGNOSIS — Z1211 Encounter for screening for malignant neoplasm of colon: Secondary | ICD-10-CM

## 2011-05-07 DIAGNOSIS — R1013 Epigastric pain: Secondary | ICD-10-CM

## 2011-05-07 DIAGNOSIS — K3189 Other diseases of stomach and duodenum: Secondary | ICD-10-CM

## 2011-05-07 MED ORDER — SODIUM CHLORIDE 0.9 % IV SOLN: 500.0000 mL | INTRAVENOUS | Status: DC

## 2011-05-07 NOTE — Op Note (Signed)
[facility] [address] [place], [place]  [address]  COLONOSCOPY PROCEDURE REPORT  PATIENT:  Stephanie Parks, Stephanie Parks  MR#:  [id number] BIRTHDATE:  January 22, 1946, 65 yrs. old  GENDER:  female ENDOSCOPIST:  Carie Caddy. Pyrtle, MD REF. BY:  Kirt Boys, MD PROCEDURE DATE:  05/07/2011 PROCEDURE:  Colonoscopy 95621 ASA CLASS:  Class II INDICATIONS:  Routine Risk Screening, 1 colonoscopy  > 10 yrs ago  MEDICATIONS:   These medications were titrated to patient response per physician's verbal order, Versed 9 mg IV, Fentanyl 75 mcg IV  DESCRIPTION OF PROCEDURE:   After the risks benefits and alternatives of the procedure were thoroughly explained, informed consent was obtained.  Digital rectal exam was performed and revealed no rectal masses.   The LB [id number] endoscope was introduced through the anus and advanced to the cecum, which was identified by both the appendix and ileocecal valve, without limitations.  The quality of the prep was good, using MoviPrep. The instrument was then slowly withdrawn as the colon was fully examined. <<PROCEDUREIMAGES>>  FINDINGS:  A normal appearing cecum, ileocecal valve, and appendiceal orifice were identified. The ascending, hepatic flexure, transverse, splenic flexure, descending, sigmoid colon, and rectum appeared unremarkable.   Retroflexed views in the rectum revealed no abnormalities.    The scope was then withdrawn from the cecum and the procedure completed.  COMPLICATIONS:  None  ENDOSCOPIC IMPRESSION: 1) Normal colon  RECOMMENDATIONS: 1) You should continue to follow colorectal cancer screening guidelines for "routine risk" patients with a repeat colonoscopy in 10 years. There is no need for FOBT (stool) testing for at least 5 years.  Carie Caddy. Rhea Belton, MD  CC:  The Patient Kirt Boys, MD  n. Rosalie DoctorCarie Caddy. Pyrtle at 05/07/2011 03:47 PM  Jeannine Kitten, [phone]

## 2011-05-07 NOTE — Progress Notes (Signed)
Patient did not experience any of the following events: a burn prior to discharge; a fall within the facility; wrong site/side/patient/procedure/implant event; or a hospital transfer or hospital admission upon discharge from the facility. (G8907) Patient did not have preoperative order for IV antibiotic SSI prophylaxis. (G8918)  

## 2011-05-07 NOTE — Patient Instructions (Signed)
Discharge instructions given with verbal understanding. Handout on a hiatal hernia given. Resume previous medications. YOU HAD AN ENDOSCOPIC PROCEDURE TODAY AT THE [facility]: Refer to the procedure report that was given to you for any specific questions about what was found during the examination.  If the procedure report does not answer your questions, please call your gastroenterologist to clarify.  If you requested that your care partner not be given the details of your procedure findings, then the procedure report has been included in a sealed envelope for you to review at your convenience later.  YOU SHOULD EXPECT: Some feelings of bloating in the abdomen. Passage of more gas than usual.  Walking can help get rid of the air that was put into your GI tract during the procedure and reduce the bloating. If you had a lower endoscopy (such as a colonoscopy or flexible sigmoidoscopy) you may notice spotting of blood in your stool or on the toilet paper. If you underwent a bowel prep for your procedure, then you may not have a normal bowel movement for a few days.  DIET: Your first meal following the procedure should be a light meal and then it is ok to progress to your normal diet.  A half-sandwich or bowl of soup is an example of a good first meal.  Heavy or fried foods are harder to digest and may make you feel nauseous or bloated.  Likewise meals heavy in dairy and vegetables can cause extra gas to form and this can also increase the bloating.  Drink plenty of fluids but you should avoid alcoholic beverages for 24 hours.  ACTIVITY: Your care partner should take you home directly after the procedure.  You should plan to take it easy, moving slowly for the rest of the day.  You can resume normal activity the day after the procedure however you should NOT DRIVE or use heavy machinery for 24 hours (because of the sedation medicines used during the test).    SYMPTOMS TO REPORT IMMEDIATELY: A  gastroenterologist can be reached at any hour.  During normal business hours, 8:30 AM to 5:00 PM Monday through Friday, call [phone].  After hours and on weekends, please call the GI answering service at [phone] who will take a message and have the physician on call contact you.   Following lower endoscopy (colonoscopy or flexible sigmoidoscopy):  Excessive amounts of blood in the stool  Significant tenderness or worsening of abdominal pains  Swelling of the abdomen that is new, acute  Fever of 100F or higher  Following upper endoscopy (EGD)  Vomiting of blood or coffee ground material  New chest pain or pain under the shoulder blades  Painful or persistently difficult swallowing  New shortness of breath  Fever of 100F or higher  Black, tarry-looking stools  FOLLOW UP: If any biopsies were taken you will be contacted by phone or by letter within the next 1-3 weeks.  Call your gastroenterologist if you have not heard about the biopsies in 3 weeks.  Our staff will call the home number listed on your records the next business day following your procedure to check on you and address any questions or concerns that you may have at that time regarding the information given to you following your procedure. This is a courtesy call and so if there is no answer at the home number and we have not heard from you through the emergency physician on call, we will assume that you   have returned to your regular daily activities without incident.  SIGNATURES/CONFIDENTIALITY: You and/or your care partner have signed paperwork which will be entered into your electronic medical record.  These signatures attest to the fact that that the information above on your After Visit Summary has been reviewed and is understood.  Full responsibility of the confidentiality of this discharge information lies with you and/or your care-partner.   

## 2011-05-07 NOTE — Op Note (Signed)
[facility] [address] [place], [place]  [address]  ENDOSCOPY PROCEDURE REPORT  PATIENT:  Stephanie, Parks  MR#:  [id number] BIRTHDATE:  June 06, 1945, 65 yrs. old  GENDER:  female ENDOSCOPIST:  Carie Caddy. Armando Bukhari, MD Referred by:   Kirt Boys, MD PROCEDURE DATE:  05/07/2011 PROCEDURE:  EGD, diagnostic 40102 ASA CLASS:  Class II INDICATIONS:  epigastric pain, dyspepsia MEDICATIONS:   These medications were titrated to patient response per physician's verbal order, Versed 5 mg IV, Fentanyl 50 mcg IV TOPICAL ANESTHETIC:  Cetacaine Spray  DESCRIPTION OF PROCEDURE:   After the risks benefits and alternatives of the procedure were thoroughly explained, informed consent was obtained.  The LB GIF-H180 [id number] endoscope was introduced through the mouth and advanced to the second portion of the duodenum, without limitations.  The instrument was slowly withdrawn as the mucosa was fully examined. <<PROCEDUREIMAGES>> The esophagus and gastroesophageal junction were completely normal in appearance.  A hiatal hernia was found.  Otherwise normal stomach.  The duodenal bulb was normal in appearance, as was the postbulbar duodenum.    Retroflexed views revealed a hiatal hernia.    The scope was then withdrawn from the patient and the procedure completed.  COMPLICATIONS:  None  ENDOSCOPIC IMPRESSION: 1) Normal esophagus 2) Hiatal hernia 3) Otherwise normal stomach 4) Normal duodenum  RECOMMENDATIONS: 1) Continue current medications 2) Office followup in about 4 weeks to ensure symptoms have resolved.  Carie Caddy. Rhea Belton, MD  CC:  The Patient Kirt Boys, MD  n. Rosalie DoctorCarie Caddy. Shataria Crist at 05/07/2011 03:44 PM  Jeannine Kitten, [phone]

## 2011-05-08 ENCOUNTER — Telehealth: Payer: Self-pay | Admitting: *Deleted

## 2011-05-08 NOTE — Telephone Encounter (Signed)
  Follow up Call-  Call back number 05/07/2011  Post procedure Call Back phone  # [phone]  Permission to leave phone message Yes     Patient questions:  Do you have a fever, pain , or abdominal swelling? no Pain Score  0 *  Have you tolerated food without any problems? yes  Have you been able to return to your normal activities? yes  Do you have any questions about your discharge instructions: Diet   no Medications  no Follow up visit  no  Do you have questions or concerns about your Care? no  Actions: * If pain score is 4 or above: No action needed, pain <4.

## 2011-05-31 ENCOUNTER — Encounter: Payer: Self-pay | Admitting: *Deleted

## 2011-06-10 ENCOUNTER — Encounter: Payer: Self-pay | Admitting: Internal Medicine

## 2011-06-11 ENCOUNTER — Ambulatory Visit: Payer: BC Managed Care – PPO | Admitting: Internal Medicine

## 2011-07-09 ENCOUNTER — Encounter: Payer: Self-pay | Admitting: Internal Medicine

## 2011-07-10 ENCOUNTER — Ambulatory Visit (INDEPENDENT_AMBULATORY_CARE_PROVIDER_SITE_OTHER): Payer: BC Managed Care – PPO | Admitting: Internal Medicine

## 2011-07-10 ENCOUNTER — Encounter: Payer: Self-pay | Admitting: Internal Medicine

## 2011-07-10 VITALS — BP 118/64 | HR 66 | Ht 66.0 in | Wt 206.2 lb

## 2011-07-10 DIAGNOSIS — R1013 Epigastric pain: Secondary | ICD-10-CM

## 2011-07-10 DIAGNOSIS — K219 Gastro-esophageal reflux disease without esophagitis: Secondary | ICD-10-CM

## 2011-07-10 MED ORDER — ALIGN 4 MG PO CAPS: 1.0000 | ORAL_CAPSULE | Freq: Every day | ORAL | Status: DC

## 2011-07-10 MED ORDER — OMEPRAZOLE 40 MG PO CPDR: 40.0000 mg | DELAYED_RELEASE_CAPSULE | Freq: Every day | ORAL | Status: DC

## 2011-07-10 NOTE — Progress Notes (Signed)
  Subjective:    Patient ID: Stephanie Parks, female    DOB: 02/27/1945, 66 y.o.   MRN: [id number]  HPI Mrs. Godsil is a 66 year old female with a PMH of hypertension, hyperlipidemia, migraines, and GERD who seen in followup. She underwent upper and lower endoscopy on 05/07/2011. Her EGD was normal except for a hiatal hernia. Her colonoscopy was normal. Today she returns stating that she is still having some heartburn, but this is significantly better with the Prilosec 40 mg daily. She is using this every day. She does continue to experience some abdominal bloating and pressure in her epigastrium. She reports this happens 2-3 times per week. She's not sure when or if certain foods trigger her symptoms. She notes this is been going on off and on for 7-8 years. She did try the align, which she feels has improved her bloating some and helped "calm" her abdomen. He does continue to express constipation, which she treats with castor oil. She does not feel she needs any other laxatives at this time, and is happy with the response to castor oil. No nausea or vomiting. Appetite is good. No weight loss. No fevers or chills.  Review of Systems As per history of present illness, otherwise negative  Current Medications, Allergies, Past Medical History, Past Surgical History, Family History and Social History were reviewed in [employer] record.     Objective:   Physical Exam BP 118/64  Pulse 66  Ht 5\' 6"  (1.676 m)  Wt 206 lb 4 oz (93.554 kg)  BMI 33.29 kg/m2 Constitutional: Well-developed and well-nourished. No distress. HEENT: Normocephalic and atraumatic. Oropharynx is clear and moist. No oropharyngeal exudate. Conjunctivae are normal. Pupils are equal round and reactive to light. No scleral icterus. Neck: Neck supple. Trachea midline. Cardiovascular: Normal rate, regular rhythm and intact distal pulses. No M/R/G Pulmonary/chest: Effort normal and breath sounds normal. No wheezing,  rales or rhonchi. Abdominal: Soft, obese, nontender, nondistended. Bowel sounds active throughout. There are no masses palpable. No hepatosplenomegaly. Extremities: no clubbing, cyanosis, or edema Lymphadenopathy: No cervical adenopathy noted. Neurological: Alert and oriented to person place and time. Skin: Skin is warm and dry. No rashes noted. Psychiatric: Normal mood and affect. Behavior is normal.  Prior endoscopy records reviewed in her medical record    Assessment & Plan:   66 year old female with a PMH of hypertension, hyperlipidemia, migraines, and GERD who seen in followup  1. Epigastric pressure/abdominal bloating -- overall it seems that her symptoms are somewhat better, but she continues to have intermittent and somewhat episodic epigastric pressure. I've asked that she pay attention to her diet and how this relates to her symptomatology. Her EGD was reassuring. I will have ordered a complete abdominal ultrasound today for further evaluation, and this will also evaluate the gallbladder help rule out gallstones. Her symptoms do not seem classic for biliary pain. Encouraged that she follow a low bloating diet, and eat smaller more frequent meals. Perhaps she is having some pressure from her hiatal hernia when her stomach is over distended. I do not get the sense that she is having gastroparesis, but this is also a possibility. His symptoms continued on her ultrasound is unrevealing, then we could consider gastric emptying study. I recommend that she continue her Prilosec 40 mg daily along with her Align one capsule daily.  2. CRC screening -- normal colonoscopy March 2013, repeat due March 2023  Return in 2-3 months

## 2011-07-10 NOTE — Patient Instructions (Signed)
You have been scheduled an Abdominal Ultrasound at [facility] on 07/12/2011 @ 8:15am  Please arrive prior to your appointment. Nothing to eat or Drink after midnight. If you cannot make this appointment please call Radiology at [phone].  Continue taking Prilosec and Align daily.   Dr. Rhea Belton would like you to eat smaller more frequent meals.    You have been given a low bloating diet.

## 2011-07-12 ENCOUNTER — Ambulatory Visit (HOSPITAL_COMMUNITY)
Admission: RE | Admit: 2011-07-12 | Discharge: 2011-07-12 | Disposition: A | Discharge status: Home / Self Care | Payer: BC Managed Care – PPO | Source: Ambulatory Visit | Attending: Internal Medicine | Admitting: Internal Medicine

## 2011-07-12 DIAGNOSIS — R1013 Epigastric pain: Secondary | ICD-10-CM | POA: Insufficient documentation

## 2011-07-23 ENCOUNTER — Other Ambulatory Visit: Payer: Self-pay | Admitting: Internal Medicine

## 2011-07-23 DIAGNOSIS — K219 Gastro-esophageal reflux disease without esophagitis: Secondary | ICD-10-CM

## 2011-07-23 DIAGNOSIS — R1013 Epigastric pain: Secondary | ICD-10-CM

## 2011-07-31 ENCOUNTER — Encounter (HOSPITAL_COMMUNITY)
Admission: RE | Admit: 2011-07-31 | Discharge: 2011-07-31 | Disposition: A | Discharge status: Home / Self Care | Payer: BC Managed Care – PPO | Source: Ambulatory Visit | Attending: Internal Medicine | Admitting: Internal Medicine

## 2011-07-31 DIAGNOSIS — R141 Gas pain: Secondary | ICD-10-CM | POA: Insufficient documentation

## 2011-07-31 DIAGNOSIS — R142 Eructation: Secondary | ICD-10-CM | POA: Insufficient documentation

## 2011-07-31 DIAGNOSIS — K219 Gastro-esophageal reflux disease without esophagitis: Secondary | ICD-10-CM | POA: Insufficient documentation

## 2011-07-31 DIAGNOSIS — R1013 Epigastric pain: Secondary | ICD-10-CM

## 2011-07-31 MED ORDER — TECHNETIUM TC 99M SULFUR COLLOID
2.1000 | Freq: Once | INTRAVENOUS | Status: AC | PRN
  Administered 2011-07-31: 2.1 via INTRAVENOUS

## 2011-08-14 ENCOUNTER — Ambulatory Visit: Payer: BC Managed Care – PPO | Admitting: Internal Medicine

## 2011-08-23 ENCOUNTER — Encounter: Payer: Self-pay | Admitting: Internal Medicine

## 2011-08-28 ENCOUNTER — Ambulatory Visit: Payer: BC Managed Care – PPO | Admitting: Internal Medicine

## 2011-10-13 ENCOUNTER — Encounter (HOSPITAL_BASED_OUTPATIENT_CLINIC_OR_DEPARTMENT_OTHER): Payer: Self-pay | Admitting: *Deleted

## 2011-10-13 ENCOUNTER — Emergency Department (HOSPITAL_BASED_OUTPATIENT_CLINIC_OR_DEPARTMENT_OTHER)
Admission: EM | Admit: 2011-10-13 | Discharge: 2011-10-14 | Disposition: A | Discharge status: Home / Self Care | Payer: BC Managed Care – PPO | Attending: Emergency Medicine | Admitting: Emergency Medicine

## 2011-10-13 DIAGNOSIS — Z882 Allergy status to sulfonamides status: Secondary | ICD-10-CM | POA: Insufficient documentation

## 2011-10-13 DIAGNOSIS — I10 Essential (primary) hypertension: Secondary | ICD-10-CM | POA: Insufficient documentation

## 2011-10-13 DIAGNOSIS — E119 Type 2 diabetes mellitus without complications: Secondary | ICD-10-CM | POA: Insufficient documentation

## 2011-10-13 DIAGNOSIS — Z87891 Personal history of nicotine dependence: Secondary | ICD-10-CM | POA: Insufficient documentation

## 2011-10-13 DIAGNOSIS — K219 Gastro-esophageal reflux disease without esophagitis: Secondary | ICD-10-CM | POA: Insufficient documentation

## 2011-10-13 DIAGNOSIS — E785 Hyperlipidemia, unspecified: Secondary | ICD-10-CM | POA: Insufficient documentation

## 2011-10-13 DIAGNOSIS — Z91013 Allergy to seafood: Secondary | ICD-10-CM | POA: Insufficient documentation

## 2011-10-13 DIAGNOSIS — K449 Diaphragmatic hernia without obstruction or gangrene: Secondary | ICD-10-CM | POA: Insufficient documentation

## 2011-10-13 LAB — CBC WITH DIFFERENTIAL/PLATELET
Hemoglobin: 11.1 g/dL — ABNORMAL LOW (ref 12.0–15.0)
Lymphocytes Relative: 34 % (ref 12–46)
Lymphs Abs: 3.9 10*3/uL (ref 0.7–4.0)
Monocytes Relative: 7 % (ref 3–12)
Neutro Abs: 6.4 10*3/uL (ref 1.7–7.7)
Neutrophils Relative %: 56 % (ref 43–77)
Platelets: 307 10*3/uL (ref 150–400)
RBC: 3.74 MIL/uL — ABNORMAL LOW (ref 3.87–5.11)
WBC: 11.4 10*3/uL — ABNORMAL HIGH (ref 4.0–10.5)

## 2011-10-13 LAB — COMPREHENSIVE METABOLIC PANEL
ALT: 12 U/L (ref 0–35)
Alkaline Phosphatase: 58 U/L (ref 39–117)
BUN: 25 mg/dL — ABNORMAL HIGH (ref 6–23)
CO2: 32 mEq/L (ref 19–32)
Chloride: 100 mEq/L (ref 96–112)
GFR calc Af Amer: 48 mL/min — ABNORMAL LOW (ref >90)
GFR calc non Af Amer: 42 mL/min — ABNORMAL LOW (ref >90)
Glucose, Bld: 96 mg/dL (ref 70–99)
Potassium: 3.5 mEq/L (ref 3.5–5.1)
Sodium: 139 mEq/L (ref 135–145)
Total Bilirubin: 0.3 mg/dL (ref 0.3–1.2)
Total Protein: 6.5 g/dL (ref 6.0–8.3)

## 2011-10-13 NOTE — ED Notes (Addendum)
Pt states she has a hx of a hiatal hernia and takes meds for same with her "flare ups" "Not helping this time" Similar episode about 1-1/2 months ago. Pain radiates to back. +diaphoresis.

## 2011-10-13 NOTE — ED Provider Notes (Signed)
History   This chart was scribed for Oniya Mandarino Smitty Cords, MD by Toya Smothers. The patient was seen in room MH01/MH01. Patient's care was started at 2222.   CSN: [id number]  Arrival date & time 10/13/11  2222   First MD Initiated Contact with Patient 10/13/11 2317      Chief Complaint  Patient presents with  . Abdominal Pain    Patient is a 66 y.o. female presenting with abdominal pain. The history is provided by the patient. No language interpreter was used.  Abdominal Pain The primary symptoms of the illness include abdominal pain and diarrhea. The primary symptoms of the illness do not include fever, shortness of breath, nausea, vomiting, hematemesis, dysuria or vaginal bleeding. The current episode started yesterday (Prior to eating). The onset of the illness was sudden. The problem has been gradually worsening.  The abdominal pain began yesterday. The pain came on suddenly. The abdominal pain has been unchanged since its onset. The abdominal pain is located in the epigastric region. The abdominal pain does not radiate. The severity of the abdominal pain is 5/10. The abdominal pain is relieved by nothing. Exacerbated by: nothing.  The diarrhea began today. The diarrhea is watery. The diarrhea occurs 2 to 4 times per day. Risk factors: Medication change.  Associated with: hiatal hernia. The patient states that she believes she is currently not pregnant. The patient has had a change in bowel habit. Risk factors for an acute abdominal problem include being elderly and a history of abdominal surgery. Symptoms associated with the illness do not include chills, anorexia, diaphoresis, heartburn, urgency, hematuria or back pain. Significant associated medical issues include GERD and diabetes. Associated medical issues comments: Abdominal surgery.  No DOe no CP no SOB no n/v/d +diarrhea x3 -nausea, emesis, cough, sore throat, and Before eating H/o hyortic hernia took omeprazole provided minimal  relief  Past Medical History  Diagnosis Date  . Hypertension   . GERD (gastroesophageal reflux disease)   . Hiatal hernia   . Migraine headache   . Hyperlipidemia     Past Surgical History  Procedure Date  . Back surgery     x 3  . Cervical spine surgery   . Abdominal hysterectomy   . Carpal tunnel release   . Neck surgery     Family History  Problem Relation Age of Onset  . Heart attack Father 50  . Stomach cancer Maternal Grandmother     History  Substance Use Topics  . Smoking status: Former Smoker -- 0.5 packs/day for 20 years    Types: Cigarettes    Quit date: 02/19/1984  . Smokeless tobacco: Never Used  . Alcohol Use: No   Review of Systems  Constitutional: Negative for fever, chills and diaphoresis.  HENT: Negative for sore throat, mouth sores and neck pain.   Respiratory: Negative for cough, chest tightness and shortness of breath.   Cardiovascular: Negative for chest pain and leg swelling.  Gastrointestinal: Positive for abdominal pain and diarrhea. Negative for heartburn, nausea, vomiting, anorexia and hematemesis.  Genitourinary: Negative for dysuria, urgency, hematuria and vaginal bleeding.  Musculoskeletal: Negative for back pain.  Neurological: Negative for syncope.  All other systems reviewed and are negative.    Allergies  Shellfish allergy and Sulfa antibiotics  Home Medications   Current Outpatient Rx  Name Route Sig Dispense Refill  . ACETAMINOPHEN 500 MG PO TABS Oral Take 500 mg by mouth every 6 (six) hours as needed. For headache    . ASPIRIN  81 MG PO TABS Oral Take 81 mg by mouth daily.    Marland Kitchen LOSARTAN POTASSIUM-HCTZ 100-25 MG PO TABS Oral Take 1 tablet by mouth daily.      Marland Kitchen LOVASTATIN 20 MG PO TABS Oral Take 20 mg by mouth daily.     Marland Kitchen NORTRIPTYLINE HCL 25 MG PO CAPS Oral Take 25 mg by mouth at bedtime.    . OMEPRAZOLE 40 MG PO CPDR Oral Take 1 capsule (40 mg total) by mouth daily. 30 capsule 1  . POTASSIUM CHLORIDE 10 MEQ PO TBCR  Oral Take 10 mEq by mouth 3 (three) times daily.     Marland Kitchen ALIGN 4 MG PO CAPS Oral Take 1 tablet by mouth daily.      BP 141/67  Pulse 78  Temp 97.6 F (36.4 C) (Oral)  Resp 20  Ht 5\' 6"  (1.676 m)  Wt 210 lb (95.255 kg)  BMI 33.89 kg/m2  SpO2 98%  Physical Exam  Constitutional: She is oriented to person, place, and time. She appears well-developed and well-nourished. No distress.  HENT:  Head: Normocephalic and atraumatic. Not microcephalic.  Mouth/Throat: Oropharynx is clear and moist. No oropharyngeal exudate or posterior oropharyngeal edema.  Eyes: Conjunctivae are normal. Pupils are equal, round, and reactive to light.  Neck: Normal range of motion. No tracheal deviation present.  Cardiovascular: Normal rate, regular rhythm and normal heart sounds.   No murmur heard. Pulmonary/Chest: Effort normal and breath sounds normal. No respiratory distress.  Abdominal: Soft. Bowel sounds are normal. There is no tenderness. There is no rebound and no guarding.  Musculoskeletal: Normal range of motion. She exhibits no edema and no tenderness.  Neurological: She is alert and oriented to person, place, and time. She has normal reflexes. She displays normal reflexes. No cranial nerve deficit. Coordination normal.  Skin: Skin is warm and dry. No rash noted. She is not diaphoretic.  Psychiatric: She has a normal mood and affect.    ED Course  Procedures (including critical care time) DIAGNOSTIC STUDIES: Oxygen Saturation is 98% on room air, normal by my interpretation.    COORDINATION OF CARE: 2302- Ordered CBC Differential STAT. 2302- Ordered Comprehensive metabolic panel STAT. 2302- Ordered Lipase, blood STAT. 2302- Ordered Troponin I STAT. 2331- Evaluated Pt. Pt is awake, alert, and oriented. 0002- Ordered DG Abd Acute W/Chest 1 time imaging     Labs Reviewed  CBC WITH DIFFERENTIAL - Abnormal; Notable for the following:    WBC 11.4 (*)     RBC 3.74 (*)     Hemoglobin 11.1 (*)      HCT 33.0 (*)     All other components within normal limits  COMPREHENSIVE METABOLIC PANEL - Abnormal; Notable for the following:    BUN 25 (*)     Creatinine, Ser 1.30 (*)     Albumin 3.3 (*)     GFR calc non Af Amer 42 (*)     GFR calc Af Amer 48 (*)     All other components within normal limits  LIPASE, BLOOD  TROPONIN I   Dg Abd Acute W/chest  10/14/2011  *RADIOLOGY REPORT*  Clinical Data: Abdominal pain  ACUTE ABDOMEN SERIES (ABDOMEN 2 VIEW & CHEST 1 VIEW)  Comparison: 12/30/2010 chest radiograph  Findings: Cardiomediastinal contours are unchanged.  Lungs are predominately clear.  No pleural effusion or pneumothorax.  No free intraperitoneal air. The bowel gas pattern is non- obstructive. Organ outlines are normal where seen. No acute or aggressive osseous abnormality identified.  IMPRESSION:  Nonobstructive bowel gas pattern.   Original Report Authenticated By: Waneta Martins, M.D.      No diagnosis found.    MDM   Date: 10/14/2011  Rate: 71  Rhythm: normal sinus rhythm  QRS Axis: normal  Intervals: normal  ST/T Wave abnormalities: normal  Conduction Disutrbances: none  Narrative Interpretation: unremarkable      In the setting of > 8 hrs of ongoing epigastric pain with a normal EKG and troponin sufficient to exclude ACS, moreover symptoms are c/w h/o of  GERD and hiatal hernia.  Will start on carafate and refer to GI    I personally performed the services described in this documentation, which was scribed in my presence. The recorded information has been reviewed and considered.     Jasmine Awe, MD 10/14/11 [phone]

## 2011-10-14 ENCOUNTER — Emergency Department (HOSPITAL_BASED_OUTPATIENT_CLINIC_OR_DEPARTMENT_OTHER): Payer: BC Managed Care – PPO

## 2011-10-14 MED ORDER — GI COCKTAIL ~~LOC~~
30.0000 mL | Freq: Once | ORAL | Status: AC
  Administered 2011-10-14: 30 mL via ORAL
  Filled 2011-10-14: qty 30

## 2011-10-14 MED ORDER — SUCRALFATE 1 GM/10ML PO SUSP: 1.0000 g | Freq: Four times a day (QID) | ORAL | Status: DC

## 2011-10-14 NOTE — ED Notes (Signed)
Patient transported to X-ray 

## 2011-10-28 ENCOUNTER — Encounter: Payer: Self-pay | Admitting: Internal Medicine

## 2011-10-29 ENCOUNTER — Other Ambulatory Visit (INDEPENDENT_AMBULATORY_CARE_PROVIDER_SITE_OTHER): Payer: BC Managed Care – PPO

## 2011-10-29 ENCOUNTER — Encounter: Payer: Self-pay | Admitting: Internal Medicine

## 2011-10-29 ENCOUNTER — Ambulatory Visit (INDEPENDENT_AMBULATORY_CARE_PROVIDER_SITE_OTHER): Payer: BC Managed Care – PPO | Admitting: Internal Medicine

## 2011-10-29 VITALS — BP 118/68 | HR 84 | Ht 65.0 in | Wt 214.2 lb

## 2011-10-29 DIAGNOSIS — G8929 Other chronic pain: Secondary | ICD-10-CM

## 2011-10-29 DIAGNOSIS — R1013 Epigastric pain: Secondary | ICD-10-CM

## 2011-10-29 DIAGNOSIS — R140 Abdominal distension (gaseous): Secondary | ICD-10-CM

## 2011-10-29 DIAGNOSIS — R142 Eructation: Secondary | ICD-10-CM

## 2011-10-29 DIAGNOSIS — K219 Gastro-esophageal reflux disease without esophagitis: Secondary | ICD-10-CM

## 2011-10-29 LAB — CBC
HCT: 36.2 % (ref 36.0–46.0)
MCHC: 32.6 g/dL (ref 30.0–36.0)
MCV: 90.7 fl (ref 78.0–100.0)
RBC: 4 Mil/uL (ref 3.87–5.11)

## 2011-10-29 LAB — BASIC METABOLIC PANEL
Calcium: 9.2 mg/dL (ref 8.4–10.5)
GFR: 70.51 mL/min (ref >60.00)
Glucose, Bld: 89 mg/dL (ref 70–99)
Sodium: 140 mEq/L (ref 135–145)

## 2011-10-29 MED ORDER — RIFAXIMIN 200 MG PO TABS: 200.0000 mg | ORAL_TABLET | Freq: Two times a day (BID) | ORAL | Status: DC

## 2011-10-29 MED ORDER — HYOSCYAMINE SULFATE 0.125 MG SL SUBL: 0.1250 mg | SUBLINGUAL_TABLET | SUBLINGUAL | Status: DC | PRN

## 2011-10-29 NOTE — Progress Notes (Signed)
Subjective:    Patient ID: Stephanie Parks, female    DOB: 02-Sep-1945, 66 y.o.   MRN: [id number]  HPI Stephanie Parks is a 67 year old female with a PMH of hypertension, hyperlipidemia, migraines, and GERD who seen in followup. I've seen her in the past for epigastric pain and GERD and she has under gone upper endoscopy and colonoscopy in March 2013 was real small hiatus hernia but was otherwise unremarkable. She continue to have intermittent epigastric abdominal pain and abdominal ultrasound gastric emptying study were all normal/unrevealing.  She returns today continuing to report intermittent epigastric and lower chest pain. This seems to be unrelated to eating, and when it occurs lasts usually for 5-30 sec.  However, in late August 2013, she had a severe episode lasting several hours. This was epigastric pain that radiated to her back and she sought care in the emergency department. Plain film was unrevealing and labs revealed mild renal insufficiency with mild leukocytosis but were otherwise unremarkable. She was told this was likely related to her hiatus hernia or reflux. She was advised to followup here.  Today she is feeling well without pain, but continues to experience the episodic pain as described above. She denies nausea and vomiting. She does report abdominal bloating along with burping and belching. The bloating has be worse after eating. She does report feeling upper abdominal pressure which is worse with lying down. She does tend to have constipation, but continues to use castor oil and is happy with his response. She continues to take omeprazole 40 mg daily and with this is having no heartburn. No fevers or chills. No blood in her stool or melena  Review of Systems As per HPI, otherwise negative  Current Medications, Allergies, Past Medical History, Past Surgical History, Family History and Social History were reviewed in [employer] record.     Objective:   Physical Exam BP 118/68  Pulse 84  Ht 5\' 5"  (1.651 m)  Wt 214 lb 3.2 oz (97.16 kg)  BMI 35.64 kg/m2 Constitutional: Well-developed and well-nourished. No distress. HEENT: Normocephalic and atraumatic. Oropharynx is clear and moist. No oropharyngeal exudate. Conjunctivae are normal.  No scleral icterus. Cardiovascular: Normal rate, regular rhythm and intact distal pulses. No M/R/G Pulmonary/chest: Effort normal and breath sounds normal. No wheezing, rales or rhonchi. Abdominal: Soft, nontender, nondistended. Bowel sounds active throughout. There are no masses palpable. No hepatosplenomegaly. Extremities: no clubbing, cyanosis, or edema Lymphadenopathy: No cervical adenopathy noted. Neurological: Alert and oriented to person place and time. Skin: Skin is warm and dry. No rashes noted. Psychiatric: Normal mood and affect. Behavior is normal.  ABDOMEN ULTRASOUND   Technique:  Complete abdominal ultrasound examination was performed including evaluation of the liver, gallbladder, bile ducts, pancreas, kidneys, spleen, IVC, and abdominal aorta.   Comparison: No comparison studies available.   Findings:   Gallbladder:  There is no evidence for gallstones.  No gallbladder wall thickening or pericholecystic fluid.  The sonographer reports no sonographic Murphy's sign.   Common Bile Duct:  Nondilated at 6 mm diameter.   Liver:  No focal intrahepatic parenchymal abnormality.  No intrahepatic biliary duct dilatation. Small 17 mm cyst noted adjacent to the gallbladder.   IVC:  Normal.   Pancreas:  Pancreatic tail is obscured by overlying bowel gas.   Spleen:  Normal.   Right kidney:  10.5 cm in long axis.  Normal.   Left kidney:  11.5 cm in long axis.  Normal.   Abdominal Aorta:  Distal aorta  not well seen secondary overlying bowel gas.  The proximal and mid segments of the abdominal aorta are nonaneurysmal.   IMPRESSION: No acute findings.  No evidence to explain the patient's  epigastric pain. NUCLEAR MEDICINE GASTRIC EMPTYING SCAN   Technique:  After oral ingestion of radiolabeled meal, sequential abdominal images were obtained for 120 minutes.  Residual percentage of activity remaining within the stomach was calculated at 60 and 120 minutes.   Radiopharmaceutical:  2.1 mCi Tc-75m sulfur colloid.   Comparison:  None   Findings: 60 minutes after radiotracer ingestion there was 58% radiotracer activity in the stomach.   After 120 minutes there is 3% radiotracer activity remaining in the stomach.   IMPRESSION:   1.  Normal gastric emptying.   ACUTE ABDOMEN SERIES (ABDOMEN 2 VIEW & CHEST 1 VIEW) -- 10/14/11   Comparison: 12/30/2010 chest radiograph   Findings: Cardiomediastinal contours are unchanged.  Lungs are predominately clear.  No pleural effusion or pneumothorax.   No free intraperitoneal air. The bowel gas pattern is non- obstructive. Organ outlines are normal where seen. No acute or aggressive osseous abnormality identified.   IMPRESSION: Nonobstructive bowel gas pattern.    CBC    Component Value Date/Time   WBC 11.4* 10/13/2011 2301   RBC 3.74* 10/13/2011 2301   HGB 11.1* 10/13/2011 2301   HCT 33.0* 10/13/2011 2301   PLT 307 10/13/2011 2301   MCV 88.2 10/13/2011 2301   MCH 29.7 10/13/2011 2301   MCHC 33.6 10/13/2011 2301   RDW 14.4 10/13/2011 2301   LYMPHSABS 3.9 10/13/2011 2301   MONOABS 0.7 10/13/2011 2301   EOSABS 0.3 10/13/2011 2301   BASOSABS 0.0 10/13/2011 2301    CMP     Component Value Date/Time   NA 139 10/13/2011 2301   K 3.5 10/13/2011 2301   CL 100 10/13/2011 2301   CO2 32 10/13/2011 2301   GLUCOSE 96 10/13/2011 2301   BUN 25* 10/13/2011 2301   CREATININE 1.30* 10/13/2011 2301   CALCIUM 8.8 10/13/2011 2301   PROT 6.5 10/13/2011 2301   ALBUMIN 3.3* 10/13/2011 2301   AST 14 10/13/2011 2301   ALT 12 10/13/2011 2301   ALKPHOS 58 10/13/2011 2301   BILITOT 0.3 10/13/2011 2301   GFRNONAA 42* 10/13/2011 2301   GFRAA 48* 10/13/2011  2301    Lipase     Component Value Date/Time   LIPASE 46 10/13/2011 2301      Assessment & Plan:  66 year old female with a PMH of hypertension, hyperlipidemia, migraines, and GERD who seen in followup  1. Epigastric pain/abd bloating -- she continues to have epigastric abdominal pain which is unclear etiology. It is radiation to the back, it raises the question of possible pancreatitis, however her episodes are intermittent, not persistent, and her lipase was normal. I have discussed further evaluation with cross-sectional imaging, and we will proceed with a contrasted CT scan of the abdomen and pelvis. She is convinced this is her hiatal hernia causing pain, and while this is rare, cannot completely exclude this phenomenon. We will await cross-sectional imaging, and I'll give her prescription for Levsin to be used for spasm/pain. Small bowel bacterial overgrowth can contribute to bloating, and I will empirically treat for this today. I will prescribe rifaximin 550 mg 3 times a day x10 days. I will see her back in 4-6 weeks to assess her response. We will also repeat a BMP before proceeding with CT, along with a CBC. She is advised to call us back of her  pain worsens before this workup is completed. We briefly discussed referral to surgery for evaluation of hiatus hernia repair, but we will wait the CT findings first.

## 2011-10-29 NOTE — Patient Instructions (Addendum)
You have been scheduled for a CT scan of the abdomen and pelvis at [facility] ([address]---this is in the same building as [facility]).   You are scheduled on 10/31/2011 at 11:00am. You should arrive 15 minutes prior to your appointment time for registration. Please follow the written instructions below on the day of your exam:  WARNING: IF YOU ARE ALLERGIC TO IODINE/X-RAY DYE, PLEASE NOTIFY RADIOLOGY IMMEDIATELY AT [phone]! YOU WILL BE GIVEN A 13 HOUR PREMEDICATION PREP.  1) Do not eat or drink anything after 7:00am (4 hours prior to your test) 2) You have been given 2 bottles of oral contrast to drink. The solution may taste better if refrigerated, but do NOT add ice or any other liquid to this solution. Shake well before drinking.    Drink 1 bottle of contrast @ 9:00 (2 hours prior to your exam)  Drink 1 bottle of contrast @ 10:00am (1 hour prior to your exam)  You may take any medications as prescribed with a small amount of water except for the following: Metformin, Glucophage, Glucovance, Avandamet, Riomet, Fortamet, Actoplus Met, Janumet, Glumetza or Metaglip. The above medications must be held the day of the exam AND 48 hours after the exam.  The purpose of you drinking the oral contrast is to aid in the visualization of your intestinal tract. The contrast solution may cause some diarrhea. Before your exam is started, you will be given a small amount of fluid to drink. Depending on your individual set of symptoms, you may also receive an intravenous injection of x-ray contrast/dye. Plan on being at [facility] for 30 minutes or long, depending on the type of exam you are having performed.  If you have any questions regarding your exam or if you need to reschedule, you may call the CT department at [phone] between the hours of 8:00 am and 5:00 pm, Monday-Friday.  Your physician has requested that you go to the basement for the following lab work  before leaving today: CBC, BMP We have sent the following medications to your pharmacy for you to pick up at your convenience: Levsin, Xifaxan, please take all medications as prescribed.  Please follow up with Dr. Rhea Belton in 4 weeks      ________________________________________________________________________

## 2011-10-31 ENCOUNTER — Ambulatory Visit (INDEPENDENT_AMBULATORY_CARE_PROVIDER_SITE_OTHER)
Admission: RE | Admit: 2011-10-31 | Discharge: 2011-10-31 | Disposition: A | Discharge status: Home / Self Care | Payer: BC Managed Care – PPO | Source: Ambulatory Visit | Attending: Internal Medicine | Admitting: Internal Medicine

## 2011-10-31 DIAGNOSIS — G8929 Other chronic pain: Secondary | ICD-10-CM

## 2011-10-31 DIAGNOSIS — R1013 Epigastric pain: Secondary | ICD-10-CM

## 2011-10-31 MED ORDER — IOHEXOL 300 MG/ML  SOLN
100.0000 mL | Freq: Once | INTRAMUSCULAR | Status: AC | PRN
  Administered 2011-10-31: 100 mL via INTRAVENOUS

## 2011-11-25 ENCOUNTER — Encounter: Payer: Self-pay | Admitting: Internal Medicine

## 2011-11-26 ENCOUNTER — Ambulatory Visit: Payer: BC Managed Care – PPO | Admitting: Internal Medicine

## 2011-12-06 ENCOUNTER — Encounter: Payer: Self-pay | Admitting: Internal Medicine

## 2011-12-10 ENCOUNTER — Encounter: Payer: Self-pay | Admitting: Internal Medicine

## 2011-12-10 ENCOUNTER — Ambulatory Visit (INDEPENDENT_AMBULATORY_CARE_PROVIDER_SITE_OTHER): Payer: BC Managed Care – PPO | Admitting: Internal Medicine

## 2011-12-10 VITALS — BP 120/70 | HR 80 | Ht 65.0 in | Wt 215.2 lb

## 2011-12-10 DIAGNOSIS — R141 Gas pain: Secondary | ICD-10-CM

## 2011-12-10 DIAGNOSIS — R140 Abdominal distension (gaseous): Secondary | ICD-10-CM

## 2011-12-10 DIAGNOSIS — R1013 Epigastric pain: Secondary | ICD-10-CM

## 2011-12-10 DIAGNOSIS — K449 Diaphragmatic hernia without obstruction or gangrene: Secondary | ICD-10-CM

## 2011-12-10 DIAGNOSIS — E663 Overweight: Secondary | ICD-10-CM

## 2011-12-10 DIAGNOSIS — K219 Gastro-esophageal reflux disease without esophagitis: Secondary | ICD-10-CM

## 2011-12-10 MED ORDER — OMEPRAZOLE 40 MG PO CPDR: 40.0000 mg | DELAYED_RELEASE_CAPSULE | Freq: Every day | ORAL | Status: DC

## 2011-12-10 NOTE — Patient Instructions (Addendum)
We have sent the following medications to your pharmacy for you to pick up at your convenience: Omeprazole.  You have been given samples Digaz, use the samples and call and let us know if it has been working for you, if so we will send in a prescription. Stop taking Align.  Dr. Rhea Belton recommends you eat smaller more frequent meals.   Follow up in 3 months

## 2011-12-10 NOTE — Progress Notes (Signed)
Subjective:    Patient ID: Stephanie Parks, female    DOB: [birth year], 66 y.o.   MRN: [id number]  HPI Stephanie Parks is a 66 year old female with a PMH of hypertension, hyperlipidemia, migraines, and GERD who seen in followup. She was last seen in early September 2013 at that time was continued to have upper abdominal symptoms without clear explanation. She underwent CT scan of the abdomen and pelvis with contrast which revealed a small hiatus hernia, a moderately large colonic stool burden but was otherwise unremarkable. She continues to have epigastric burning pain after meals which is relieved when she uses omeprazole. She's only taking this on an as-needed basis which is approximately twice per week. She also continues to feel heavy with abdominal bloating, which is worse after heavier meals or larger portions. She also occasionally feels upper abdominal pressure when lying down.  She did a trial of rifaximin for bacterial overgrowth and had no relief of her bloating. She also was taking align on a daily basis without much benefit. She has used MiraLAX to some success for constipation.  No blood in her stool or melena. She does not complaining of constipation or diarrhea today. Her biggest complaint, when asked is the epigastric and lower chest burning associated with meals and heartburn.  Overall over the last 6-8 months she has gained approximately 15 or 16 pounds which she thinks makes her feel more bloated as well. He relates his weight gain to a change or more liberal diet.  Review of Systems As per history of present illness, otherwise negative  Current Medications, Allergies, Past Medical History, Past Surgical History, Family History and Social History were reviewed in [employer] record.     Objective:   Physical Exam BP 120/70  Pulse 80  Ht 5\' 5"  (1.651 m)  Wt 215 lb 3.2 oz (97.614 kg)  BMI 35.81 kg/m2 Constitutional: Well-developed and well-nourished. No  distress. HEENT: Normocephalic and atraumatic. Conjunctivae are normal.  No scleral icterus. Extremities: no clubbing, cyanosis, or edema Neurological: Alert and oriented to person place and time. Psychiatric: Normal mood and affect. Behavior is normal.  CBC    Component Value Date/Time   WBC 7.7 10/29/2011 1149   RBC 4.00 10/29/2011 1149   HGB 11.8* 10/29/2011 1149   HCT 36.2 10/29/2011 1149   PLT 284.0 10/29/2011 1149   MCV 90.7 10/29/2011 1149   MCH 29.7 10/13/2011 2301   MCHC 32.6 10/29/2011 1149   RDW 14.1 10/29/2011 1149   LYMPHSABS 3.9 10/13/2011 2301   MONOABS 0.7 10/13/2011 2301   EOSABS 0.3 10/13/2011 2301   BASOSABS 0.0 10/13/2011 2301   CMP     Component Value Date/Time   NA 140 10/29/2011 1149   K 3.8 10/29/2011 1149   CL 100 10/29/2011 1149   CO2 33* 10/29/2011 1149   GLUCOSE 89 10/29/2011 1149   BUN 18 10/29/2011 1149   CREATININE 1.0 10/29/2011 1149   CALCIUM 9.2 10/29/2011 1149   PROT 6.5 10/13/2011 2301   ALBUMIN 3.3* 10/13/2011 2301   AST 14 10/13/2011 2301   ALT 12 10/13/2011 2301   ALKPHOS 58 10/13/2011 2301   BILITOT 0.3 10/13/2011 2301   GFRNONAA 42* 10/13/2011 2301   GFRAA 48* 10/13/2011 2301    Lipase     Component Value Date/Time   LIPASE 46 10/13/2011 2301    Imaging: Clinical Data: Abdominal pain.  Hiatal hernia. -- SEPT 2013   CT ABDOMEN AND PELVIS WITH CONTRAST   Technique:  Multidetector CT imaging of the abdomen and pelvis was performed following the standard protocol during bolus administration of intravenous contrast.   Contrast: OMNIPAQUE IOHEXOL 300 MG/ML  SOLN   Comparison: None.   Findings: A small hiatal hernia is noted.  The liver, spleen, and pancreas are normal appearance.  Gallbladder is unremarkable.  Both adrenal glands and kidneys are normal appearance.  No evidence of hydronephrosis.  No soft tissue masses or lymphadenopathy are identified within the abdomen or pelvis.   Previous hysterectomy noted.  Adnexa are unremarkable.   Normal appendix is visualized.  No evidence of inflammatory process or abnormal fluid collections.  Moderate to large colonic stool burden noted, however there is no evidence of bowel wall thickening or dilatation.   IMPRESSION:   1.  No acute findings. 2.  Small hiatal hernia. 3.  Moderate to large colonic stool burden; suggest clinical correlation for possible constipation.        Assessment & Plan:   66 year old female with a PMH of hypertension, hyperlipidemia, migraines, and GERD who seen in followup  1.  Epigastric burning/GERD/small hiatus hernia/bloating -- I feel that acid reflux is the cause of most of her problems. She is not taking her PPI on a consistent basis and my first recommendation today is that she schedule her omeprazole 40 mg once daily 30 minutes to one hour before her first meal of the day. I think her recent weight gain is contributing to her sense of bloating and abdominal fullness and we have discussed this today.  I think if she could lose 15-16 pounds her reflux, abdominal pressure and bloating would be better. She has had endoscopy, gastric emptying study, and cross-sectional imaging all unrevealing, which is reassuring. She did not get benefit from rifaximin for empiric small bowel bacterial overgrowth. Also recommended smaller more frequent meals and that she avoid lying down within 2 hours of a meal. I'll give her samples of Digaz today one to 2 capsules with meals which may help with her upper GI symptoms and bloating. She is asked to call us if she is not improving on a daily PPI and certainly if she is worsening.  2.  CRC screening -- normal colonoscopy March 2013, repeat recommended March 2023  We will see her back in 3-4 months.

## 2012-01-15 ENCOUNTER — Other Ambulatory Visit: Payer: Self-pay | Admitting: Gastroenterology

## 2012-01-20 ENCOUNTER — Other Ambulatory Visit: Payer: Self-pay | Admitting: Internal Medicine

## 2012-01-20 DIAGNOSIS — G8929 Other chronic pain: Secondary | ICD-10-CM

## 2012-01-20 DIAGNOSIS — R1013 Epigastric pain: Secondary | ICD-10-CM

## 2012-01-20 MED ORDER — RIFAXIMIN 200 MG PO TABS: 200.0000 mg | ORAL_TABLET | Freq: Two times a day (BID) | ORAL | Status: AC

## 2012-06-18 ENCOUNTER — Emergency Department (HOSPITAL_BASED_OUTPATIENT_CLINIC_OR_DEPARTMENT_OTHER): Payer: BC Managed Care – PPO

## 2012-06-18 ENCOUNTER — Encounter (HOSPITAL_BASED_OUTPATIENT_CLINIC_OR_DEPARTMENT_OTHER): Payer: Self-pay

## 2012-06-18 ENCOUNTER — Emergency Department (HOSPITAL_BASED_OUTPATIENT_CLINIC_OR_DEPARTMENT_OTHER)
Admission: EM | Admit: 2012-06-18 | Discharge: 2012-06-18 | Disposition: A | Discharge status: Home / Self Care | Payer: BC Managed Care – PPO | Attending: Emergency Medicine | Admitting: Emergency Medicine

## 2012-06-18 DIAGNOSIS — Z79899 Other long term (current) drug therapy: Secondary | ICD-10-CM | POA: Insufficient documentation

## 2012-06-18 DIAGNOSIS — R0602 Shortness of breath: Secondary | ICD-10-CM | POA: Insufficient documentation

## 2012-06-18 DIAGNOSIS — Z8719 Personal history of other diseases of the digestive system: Secondary | ICD-10-CM | POA: Insufficient documentation

## 2012-06-18 DIAGNOSIS — R110 Nausea: Secondary | ICD-10-CM | POA: Insufficient documentation

## 2012-06-18 DIAGNOSIS — E785 Hyperlipidemia, unspecified: Secondary | ICD-10-CM | POA: Insufficient documentation

## 2012-06-18 DIAGNOSIS — Z7982 Long term (current) use of aspirin: Secondary | ICD-10-CM | POA: Insufficient documentation

## 2012-06-18 DIAGNOSIS — I10 Essential (primary) hypertension: Secondary | ICD-10-CM | POA: Insufficient documentation

## 2012-06-18 DIAGNOSIS — Z87891 Personal history of nicotine dependence: Secondary | ICD-10-CM | POA: Insufficient documentation

## 2012-06-18 DIAGNOSIS — Z8679 Personal history of other diseases of the circulatory system: Secondary | ICD-10-CM | POA: Insufficient documentation

## 2012-06-18 DIAGNOSIS — K219 Gastro-esophageal reflux disease without esophagitis: Secondary | ICD-10-CM | POA: Insufficient documentation

## 2012-06-18 LAB — CBC WITH DIFFERENTIAL/PLATELET
Eosinophils Relative: 6 % — ABNORMAL HIGH (ref 0–5)
HCT: 36.4 % (ref 36.0–46.0)
Lymphocytes Relative: 40 % (ref 12–46)
Lymphs Abs: 3 10*3/uL (ref 0.7–4.0)
MCV: 88.3 fL (ref 78.0–100.0)
Monocytes Absolute: 0.4 10*3/uL (ref 0.1–1.0)
Monocytes Relative: 5 % (ref 3–12)
RBC: 4.12 MIL/uL (ref 3.87–5.11)
WBC: 7.6 10*3/uL (ref 4.0–10.5)

## 2012-06-18 LAB — COMPREHENSIVE METABOLIC PANEL
AST: 21 U/L (ref 0–37)
Albumin: 3.7 g/dL (ref 3.5–5.2)
Calcium: 9.7 mg/dL (ref 8.4–10.5)
Chloride: 98 mEq/L (ref 96–112)
Creatinine, Ser: 1 mg/dL (ref 0.50–1.10)
Sodium: 141 mEq/L (ref 135–145)

## 2012-06-18 MED ORDER — POTASSIUM CHLORIDE CRYS ER 20 MEQ PO TBCR: 20.0000 meq | EXTENDED_RELEASE_TABLET | Freq: Every day | ORAL | Status: DC

## 2012-06-18 MED ORDER — POTASSIUM CHLORIDE CRYS ER 20 MEQ PO TBCR
40.0000 meq | EXTENDED_RELEASE_TABLET | Freq: Once | ORAL | Status: AC
  Administered 2012-06-18: 40 meq via ORAL
  Filled 2012-06-18: qty 2

## 2012-06-18 MED ORDER — SUCRALFATE 1 G PO TABS: 1.0000 g | ORAL_TABLET | Freq: Four times a day (QID) | ORAL | Status: DC

## 2012-06-18 NOTE — ED Provider Notes (Signed)
History     CSN: [id number]  Arrival date & time 06/18/12  1802   First MD Initiated Contact with Patient 06/18/12 1818      Chief Complaint  Patient presents with  . Chest Pain  . Shortness of Breath  . Nausea     HPI Patient presents with chest pain which started around 4 PM.  Pain lasted just a few seconds..  Was associated with some nausea and radiation left arm.  Patient's had a history of chest pain the past associated with hiatal hernia.  Had a normal stress test done approximately one year ago.  Patient has history of high blood pressure and hiatal hernia. Past Medical History  Diagnosis Date  . Hypertension   . GERD (gastroesophageal reflux disease)   . Hiatal hernia   . Migraine headache   . Hyperlipidemia     Past Surgical History  Procedure Laterality Date  . Back surgery      x 3  . Cervical spine surgery    . Abdominal hysterectomy    . Carpal tunnel release      left wrist  . Hemorrhoid surgery      Family History  Problem Relation Age of Onset  . Heart attack Father 78  . Stomach cancer Maternal Grandmother   . Colon cancer Neg Hx     History  Substance Use Topics  . Smoking status: Former Smoker -- 0.50 packs/day for 20 years    Types: Cigarettes    Quit date: 02/19/1984  . Smokeless tobacco: Never Used  . Alcohol Use: Yes     Comment: former    OB History   Grav Para Term Preterm Abortions TAB SAB Ect Mult Living                  Review of Systems All other systems reviewed and are negative Allergies  Shellfish allergy and Sulfa antibiotics  Home Medications   Current Outpatient Rx  Name  Route  Sig  Dispense  Refill  . omeprazole (PRILOSEC) 40 MG capsule   Oral   Take 40 mg by mouth daily.         Marland Kitchen aspirin 81 MG tablet   Oral   Take 81 mg by mouth daily.         Marland Kitchen losartan-hydrochlorothiazide (HYZAAR) 100-25 MG per tablet   Oral   Take 1 tablet by mouth daily.           Marland Kitchen lovastatin (MEVACOR) 20 MG tablet  Oral   Take 20 mg by mouth daily.          . nortriptyline (PAMELOR) 25 MG capsule   Oral   Take 25 mg by mouth at bedtime.         . potassium chloride (KLOR-CON) 10 MEQ CR tablet   Oral   Take 10 mEq by mouth 3 (three) times daily.          . sucralfate (CARAFATE) 1 G tablet   Oral   Take 1 tablet (1 g total) by mouth 4 (four) times daily.   14 tablet   0     BP 142/71  Pulse 88  Temp(Src) 97.9 F (36.6 C) (Oral)  Resp 16  Ht 5\' 6"  (1.676 m)  Wt 220 lb (99.791 kg)  BMI 35.53 kg/m2  SpO2 100%  Physical Exam  Nursing note and vitals reviewed. Constitutional: She is oriented to person, place, and time. She appears well-developed and well-nourished. No distress.  HENT:  Head: Normocephalic and atraumatic.  Eyes: Pupils are equal, round, and reactive to light.  Neck: Normal range of motion.  Cardiovascular: Normal rate and intact distal pulses.   Pulmonary/Chest: No respiratory distress.  Abdominal: Normal appearance. She exhibits no distension. There is no tenderness. There is no rebound.  Musculoskeletal: Normal range of motion.  Neurological: She is alert and oriented to person, place, and time. No cranial nerve deficit.  Skin: Skin is warm and dry. No rash noted.  Psychiatric: She has a normal mood and affect. Her behavior is normal.    ED Course  Procedures (including critical care time)  Date: 06/18/2012  Rate: 89  Rhythm: normal sinus rhythm  QRS Axis: normal  Intervals: normal  ST/T Wave abnormalities: normal  Conduction Disutrbances: 4 anterior R wave progression unchanged from previous EKG dated August 2013  Narrative Interpretation: Unchanged EKG     Labs Reviewed  COMPREHENSIVE METABOLIC PANEL - Abnormal; Notable for the following:    Potassium 2.8 (*)    Glucose, Bld 111 (*)    Total Bilirubin 0.1 (*)    GFR calc non Af Amer 57 (*)    GFR calc Af Amer 67 (*)    All other components within normal limits  CBC WITH DIFFERENTIAL -  Abnormal; Notable for the following:    Eosinophils Relative 6 (*)    All other components within normal limits  TROPONIN I   Dg Chest 2 View  06/18/2012  *RADIOLOGY REPORT*  Clinical Data: Chest pain.  Shortness of breath.  Nausea.  CHEST - 2 VIEW  Comparison: 12/30/2010.  Findings:  Cardiopericardial silhouette within normal limits. Mediastinal contours normal. Trachea midline.  No airspace disease or effusion. Monitoring leads are projected over the chest. Prominent calcification over the left first rib end.  Lower cervical ACDF.  IMPRESSION: No interval change or acute cardiopulmonary disease.   Original Report Authenticated By: Andreas Newport, M.D.      1. Reflux       MDM         Nelia Shi, MD 06/18/12 2003

## 2012-06-18 NOTE — ED Notes (Signed)
MD at bedside. 

## 2012-06-18 NOTE — ED Notes (Signed)
Pt transported and returned from radiology 

## 2012-06-18 NOTE — ED Notes (Signed)
Pt requested to take personal BP and Cholesterol Medication.  MD ok'd.  Pt taking Losartan and Lovastatin. Pt up to the bathroom. Pt tolerated well.

## 2012-06-18 NOTE — ED Notes (Signed)
Pt reports onset of midsternal chest pain radiating to left arm associated with nausea and SHOB 1630. Denies pain now.

## 2012-08-17 ENCOUNTER — Ambulatory Visit (INDEPENDENT_AMBULATORY_CARE_PROVIDER_SITE_OTHER): Payer: BC Managed Care – PPO | Admitting: Cardiovascular Disease

## 2012-08-17 ENCOUNTER — Encounter: Payer: Self-pay | Admitting: Cardiovascular Disease

## 2012-08-17 VITALS — BP 112/68 | HR 95 | Ht 66.0 in | Wt 219.1 lb

## 2012-08-17 DIAGNOSIS — R002 Palpitations: Secondary | ICD-10-CM

## 2012-08-17 DIAGNOSIS — I2510 Atherosclerotic heart disease of native coronary artery without angina pectoris: Secondary | ICD-10-CM

## 2012-08-17 NOTE — Patient Instructions (Addendum)
Your physician wants you to follow-up in:  12 months.  You will receive a reminder letter in the mail two months in advance. If you don't receive a letter, please call our office to schedule the follow-up appointment.   

## 2012-08-17 NOTE — Progress Notes (Signed)
History of Present Illness: 67 yo AAF with history of HTN, HLD, hiatal hernia, GERD who is here today for cardiac follow up. I saw her in February 2013 for evaluation of chest pain, dizziness. She had a heart catheterization in 2003 at [facility]. She was told there was minor plaque disease. I arrange an exercise treadmill stress test February 2013 and she had no ischemia. Echo February 2013 overall normal.   She is here today for follow up. She has been feeling well. Occasional fluttering in chest, lasts for few seconds. Rare sharp pain left breast at rest. No radiation or associated symptoms. Exercising 4 days per week and no chest pain during exertion.   Primary Care Physician: Kirt Boys    Past Medical History  Diagnosis Date  . Hypertension   . GERD (gastroesophageal reflux disease)   . Hiatal hernia   . Migraine headache   . Hyperlipidemia     Past Surgical History  Procedure Laterality Date  . Back surgery      x 3  . Cervical spine surgery    . Abdominal hysterectomy    . Carpal tunnel release      left wrist  . Hemorrhoid surgery      Current Outpatient Prescriptions  Medication Sig Dispense Refill  . aspirin 81 MG tablet Take 81 mg by mouth daily.      Marland Kitchen losartan-hydrochlorothiazide (HYZAAR) 100-25 MG per tablet Take 1 tablet by mouth daily.        Marland Kitchen lovastatin (MEVACOR) 20 MG tablet Take 20 mg by mouth daily.       . nortriptyline (PAMELOR) 25 MG capsule Take 25 mg by mouth at bedtime.      Marland Kitchen omeprazole (PRILOSEC) 40 MG capsule Take 40 mg by mouth daily.      . potassium chloride SA (K-DUR,KLOR-CON) 20 MEQ tablet Take 1 tablet (20 mEq total) by mouth daily.  15 tablet  0  . [DISCONTINUED] esomeprazole (NEXIUM) 40 MG capsule Take 1 capsule (40 mg total) by mouth daily.  30 capsule  3   No current facility-administered medications for this visit.    Allergies  Allergen Reactions  . Shellfish Allergy Hives and Swelling  . Sulfa Antibiotics  Other (See Comments)    Fine bumps    History   Social History  . Marital Status: Divorced    Spouse Name: N/A    Number of Children: 2  . Years of Education: N/A   Occupational History  . 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 Smoking status: Former Smoker -- 0.50 packs/day for 20 years    Types: Cigarettes    Quit date: 02/19/1984  . Smokeless tobacco: Never Used  . Alcohol Use: Yes     Comment: former  . Drug Use: No  . Sexually Active: Not on file   Other Topics Concern  . Not on file   Social History Narrative  . No narrative on file    Family History  Problem Relation Age of Onset  . Heart attack Father 51  . Stomach cancer Maternal Grandmother   . Colon cancer Neg Hx     Review of Systems:  As stated in the HPI and otherwise negative.   BP 112/68  Pulse 95  Ht 5\' 6"  (1.676 m)  Wt 219 lb 1.9 oz (99.392 kg)  BMI 35.38 kg/m2  SpO2 98%  Physical Examination: General: Well developed, well nourished, NAD HEENT: OP clear,  mucus membranes moist SKIN: warm, dry. No rashes. Neuro: No focal deficits Musculoskeletal: Muscle strength 5/5 all ext Psychiatric: Mood and affect normal Neck: No JVD, no carotid bruits, no thyromegaly, no lymphadenopathy. Lungs:Clear bilaterally, no wheezes, rhonci, crackles Cardiovascular: Regular rate and rhythm. No murmurs, gallops or rubs. Abdomen:Soft. Bowel sounds present. Non-tender.  Extremities: No lower extremity edema. Pulses are 2 + in the bilateral DP/PT.  Echo February 2013: Left ventricle: The cavity size was normal. Systolic function was normal. The estimated ejection fraction was in the range of 55% to 60%. Wall motion was normal; there were no regional wall motion abnormalities. Doppler parameters are consistent with abnormal left ventricular relaxation (grade 1 diastolic dysfunction). - Atrial septum: No defect or patent foramen ovale was identified.  Assessment and Plan:   1. CAD: Atypical chest  pains. Likely non-cardiac. Continue ASA and statin.   2. Palpitations: Rare. Likely premature beats. She will call if they increase in frequency.

## 2012-08-24 DIAGNOSIS — R519 Headache, unspecified: Secondary | ICD-10-CM

## 2012-08-24 HISTORY — DX: Headache, unspecified: R51.9

## 2012-08-31 ENCOUNTER — Ambulatory Visit: Payer: BC Managed Care – PPO | Admitting: Cardiovascular Disease

## 2012-09-28 DIAGNOSIS — K220 Achalasia of cardia: Secondary | ICD-10-CM | POA: Insufficient documentation

## 2012-09-28 DIAGNOSIS — K5900 Constipation, unspecified: Secondary | ICD-10-CM

## 2012-09-28 HISTORY — DX: Constipation, unspecified: K59.00

## 2012-09-28 HISTORY — DX: Achalasia of cardia: K22.0

## 2012-10-02 LAB — LIPID PANEL
Cholesterol: 185 mg/dL (ref 0–200)
HDL: 67 mg/dL (ref 35–70)
LDL Cholesterol: 105 mg/dL
Triglycerides: 66 mg/dL (ref 40–160)

## 2012-10-02 LAB — TSH: TSH: 4.1 u[IU]/mL (ref 0.41–5.90)

## 2012-10-02 LAB — HEMOGLOBIN A1C: Hemoglobin A1C: 5.9

## 2013-10-22 LAB — BASIC METABOLIC PANEL
BUN: 16 mg/dL (ref 4–21)
CREATININE: 0.9 mg/dL (ref 0.5–1.1)
GLUCOSE: 93 mg/dL
Potassium: 3.6 mmol/L (ref 3.4–5.3)
Sodium: 144 mmol/L (ref 137–147)

## 2015-01-11 LAB — TSH: TSH: 3.88 u[IU]/mL (ref 0.41–5.90)

## 2015-03-01 ENCOUNTER — Encounter: Payer: Self-pay | Admitting: Internal Medicine

## 2015-03-01 ENCOUNTER — Ambulatory Visit (INDEPENDENT_AMBULATORY_CARE_PROVIDER_SITE_OTHER): Payer: Medicare Other | Admitting: Internal Medicine

## 2015-03-01 VITALS — BP 120/78 | HR 96 | Temp 98.2°F | Resp 20 | Ht 64.7 in | Wt 221.1 lb

## 2015-03-01 DIAGNOSIS — I2510 Atherosclerotic heart disease of native coronary artery without angina pectoris: Secondary | ICD-10-CM | POA: Insufficient documentation

## 2015-03-01 DIAGNOSIS — R42 Dizziness and giddiness: Secondary | ICD-10-CM | POA: Insufficient documentation

## 2015-03-01 DIAGNOSIS — J301 Allergic rhinitis due to pollen: Secondary | ICD-10-CM

## 2015-03-01 DIAGNOSIS — I729 Aneurysm of unspecified site: Secondary | ICD-10-CM

## 2015-03-01 DIAGNOSIS — J302 Other seasonal allergic rhinitis: Secondary | ICD-10-CM | POA: Insufficient documentation

## 2015-03-01 DIAGNOSIS — J3089 Other allergic rhinitis: Secondary | ICD-10-CM

## 2015-03-01 DIAGNOSIS — I10 Essential (primary) hypertension: Secondary | ICD-10-CM

## 2015-03-01 DIAGNOSIS — J309 Allergic rhinitis, unspecified: Secondary | ICD-10-CM | POA: Insufficient documentation

## 2015-03-01 DIAGNOSIS — E7800 Pure hypercholesterolemia, unspecified: Secondary | ICD-10-CM

## 2015-03-01 DIAGNOSIS — R202 Paresthesia of skin: Secondary | ICD-10-CM | POA: Insufficient documentation

## 2015-03-01 DIAGNOSIS — E876 Hypokalemia: Secondary | ICD-10-CM | POA: Insufficient documentation

## 2015-03-01 HISTORY — DX: Paresthesia of skin: R20.2

## 2015-03-01 HISTORY — DX: Essential (primary) hypertension: I10

## 2015-03-01 HISTORY — DX: Atherosclerotic heart disease of native coronary artery without angina pectoris: I25.10

## 2015-03-01 HISTORY — DX: Dizziness and giddiness: R42

## 2015-03-01 HISTORY — DX: Aneurysm of unspecified site: I72.9

## 2015-03-01 HISTORY — DX: Hypokalemia: E87.6

## 2015-03-01 HISTORY — DX: Pure hypercholesterolemia, unspecified: E78.00

## 2015-03-01 HISTORY — DX: Other allergic rhinitis: J30.89

## 2015-03-01 MED ORDER — NONFORMULARY OR COMPOUNDED ITEM: Status: DC

## 2015-03-01 NOTE — Progress Notes (Signed)
History of Present Illness: Stephanie Parks is a 70 y.o. female presenting for follow-up.  HPI Comments: Allergic rhinitis on immunotherapy: Maintenance reached May 2009. Since her last visit, she did try stopping immunotherapy for 4 weeks but felt that her symptoms worsened so she resumed her injections. She is getting injections every 4 weeks without any complications. Due to lack of transportation, she has been getting her injections from her daughter who worked in a lab in the past. For the past several weeks, she has had nasal congestion, nasal drainage of clear mucus. She has not had any coughing. Normally, she has good symptom control   Assessment and Plan: Allergic rhinitis due to pollen  Continue allergy injections.  Given EpiPen and action plan-educated on use Given Prednisone 10 mg tablets. Take 1 tablet twice a day for 4 days, then 1 tablet on day #5. While sick, use fluticasone one spray each nostril twice a day   Return in about 1 year (around 02/29/2016).  Medications ordered this encounter:  Meds ordered this encounter  Medications  . DISCONTD: fluticasone (FLONASE) 50 MCG/ACT nasal spray    Sig: Place into the nose.  Marland Kitchen DISCONTD: losartan (COZAAR) 25 MG tablet    Sig: Take 25 mg by mouth.  . Multiple Vitamins-Minerals (MULTIVITAL) tablet    Sig: Take by mouth.  . naproxen (NAPROSYN) 500 MG tablet    Sig: Take 500 mg by mouth.  . DISCONTD: aspirin EC 81 MG tablet    Sig: Take 81 mg by mouth.  . DISCONTD: lovastatin (MEVACOR) 10 MG tablet    Sig: Take 10 mg by mouth.  . DISCONTD: nortriptyline (PAMELOR) 50 MG capsule    Sig: Take 50 mg by mouth.  Marland Kitchen omeprazole (PRILOSEC) 40 MG capsule    Sig: TAKE ONE CAPSULE BY MOUTH EVERY DAY  . DISCONTD: potassium chloride SA (K-DUR,KLOR-CON) 20 MEQ tablet    Sig: Take 20 mEq by mouth.  Marland Kitchen HYDROcodone-acetaminophen (NORCO/VICODIN) 5-325 MG tablet    Sig: Take by mouth.  Marland Kitchen ibuprofen (ADVIL,MOTRIN) 600 MG tablet    Sig: Take 600  mg by mouth.  . DISCONTD: potassium chloride (K-DUR) 10 MEQ tablet    Sig: Take by mouth.  . DISCONTD: losartan-hydrochlorothiazide (HYZAAR) 100-25 MG tablet    Sig: Take by mouth.  . lovastatin (MEVACOR) 20 MG tablet    Sig: Take 20 mg by mouth.  . DISCONTD: nortriptyline (PAMELOR) 25 MG capsule    Sig: Take 25 mg by mouth.  . cyclobenzaprine (FLEXERIL) 5 MG tablet    Sig: Take 5 mg by mouth.  . DISCONTD: methylPREDNISolone (MEDROL DOSEPAK) 4 MG TBPK tablet    Sig: Take 6 pills day one, take 5 pills day two, take 4 pills day three, take 3 pills day four, take 2 pills day five and take the last pill on day six.  Marland Kitchen traMADol (ULTRAM) 50 MG tablet    Sig: Take 50 mg by mouth.  . DISCONTD: aspirin 81 MG tablet    Sig: Take 81 mg by mouth.  . DISCONTD: nortriptyline (PAMELOR) 10 MG capsule    Sig: Take 10 mg by mouth.  . fluticasone (FLONASE) 50 MCG/ACT nasal spray    Sig: USE 2 SPRAYS IN NOSTRIL(S) DAILY AS NEEDED.    Refill:  3  . ipratropium (ATROVENT) 0.06 % nasal spray    Sig: INHALE TWO SPRAYS IN EACH NOSTRIL 3-4 TIMES DAILY FOR FOUR DAYS    Refill:  0  . DISCONTD: promethazine-dextromethorphan (  PROMETHAZINE-DM) 6.25-15 MG/5ML syrup    Sig: TAKE 1 TEASPOONFUL AT BEDTIME AS NEEDED FOR COUGH.    Refill:  0    Physical Exam: BP 120/78 mmHg  Pulse 96  Temp(Src) 98.2 F (36.8 C) (Oral)  Resp 20  Ht 5' 4.7" (1.643 m)  Wt 221 lb 1.6 oz (100.29 kg)  BMI 37.15 kg/m2   Physical Exam  Constitutional: She appears well-developed and well-nourished. No distress.  HENT:  Right Ear: External ear normal.  Left Ear: External ear normal.  Nose: Nose normal.  Mouth/Throat: Oropharynx is clear and moist.  Eyes: Conjunctivae are normal. Right eye exhibits no discharge. Left eye exhibits no discharge.  Cardiovascular: Normal rate, regular rhythm and normal heart sounds.   No murmur heard. Pulmonary/Chest: Effort normal and breath sounds normal. No respiratory distress. She has no wheezes.  She has no rales.  Abdominal: Soft. Bowel sounds are normal.  Musculoskeletal: She exhibits no edema.  Lymphadenopathy:    She has no cervical adenopathy.  Neurological: She is alert.  Skin: No rash noted.  Vitals reviewed.   Medications: Current outpatient prescriptions:  .  aspirin 81 MG tablet, Take 81 mg by mouth daily., Disp: , Rfl:  .  cyclobenzaprine (FLEXERIL) 5 MG tablet, Take 5 mg by mouth., Disp: , Rfl:  .  fluticasone (FLONASE) 50 MCG/ACT nasal spray, USE 2 SPRAYS IN NOSTRIL(S) DAILY AS NEEDED., Disp: , Rfl: 3 .  HYDROcodone-acetaminophen (NORCO/VICODIN) 5-325 MG tablet, Take by mouth., Disp: , Rfl:  .  ibuprofen (ADVIL,MOTRIN) 600 MG tablet, Take 600 mg by mouth., Disp: , Rfl:  .  ipratropium (ATROVENT) 0.06 % nasal spray, INHALE TWO SPRAYS IN EACH NOSTRIL 3-4 TIMES DAILY FOR FOUR DAYS, Disp: , Rfl: 0 .  losartan-hydrochlorothiazide (HYZAAR) 100-25 MG per tablet, Take 1 tablet by mouth daily.  , Disp: , Rfl:  .  lovastatin (MEVACOR) 20 MG tablet, Take 20 mg by mouth., Disp: , Rfl:  .  Multiple Vitamins-Minerals (MULTIVITAL) tablet, Take by mouth., Disp: , Rfl:  .  naproxen (NAPROSYN) 500 MG tablet, Take 500 mg by mouth., Disp: , Rfl:  .  nortriptyline (PAMELOR) 25 MG capsule, Take 25 mg by mouth at bedtime., Disp: , Rfl:  .  omeprazole (PRILOSEC) 40 MG capsule, TAKE ONE CAPSULE BY MOUTH EVERY DAY, Disp: , Rfl:  .  potassium chloride SA (K-DUR,KLOR-CON) 20 MEQ tablet, Take 1 tablet (20 mEq total) by mouth daily., Disp: 15 tablet, Rfl: 0 .  traMADol (ULTRAM) 50 MG tablet, Take 50 mg by mouth., Disp: , Rfl:  .  [DISCONTINUED] esomeprazole (NEXIUM) 40 MG capsule, Take 1 capsule (40 mg total) by mouth daily., Disp: 30 capsule, Rfl: 3  Drug Allergies:  Allergies  Allergen Reactions  . Shellfish Allergy Hives and Swelling  . Ace Inhibitors Other (See Comments)  . Pitavastatin   . Shellfish-Derived Products Other (See Comments)    Hives Hives  . Sulfamethoxazole Other (See  Comments)    Hives  . Sulfa Antibiotics Other (See Comments) and Rash    Fine bumps    ROS: Per HPI unless specifically indicated below Review of Systems  Thank you for the opportunity to care for this patient.  Please do not hesitate to contact me with questions.

## 2015-03-01 NOTE — Assessment & Plan Note (Signed)
   Continue allergy injections.  Given EpiPen and action plan-educated on use Given Prednisone 10 mg tablets. Take 1 tablet twice a day for 4 days, then 1 tablet on day #5. While sick, use fluticasone one spray each nostril twice a day

## 2015-03-01 NOTE — Patient Instructions (Signed)
Allergic rhinitis due to pollen  Continue allergy injections.  Given EpiPen and action plan-educated on use Given Prednisone 10 mg tablets. Take 1 tablet twice a day for 4 days, then 1 tablet on day #5. While sick, use fluticasone one spray each nostril twice a day

## 2015-03-03 DIAGNOSIS — J3089 Other allergic rhinitis: Secondary | ICD-10-CM | POA: Diagnosis not present

## 2015-03-15 ENCOUNTER — Ambulatory Visit (INDEPENDENT_AMBULATORY_CARE_PROVIDER_SITE_OTHER): Payer: Medicare Other

## 2015-03-15 DIAGNOSIS — J309 Allergic rhinitis, unspecified: Secondary | ICD-10-CM

## 2015-03-15 NOTE — Progress Notes (Signed)
Immunotherapy   Patient Details  Name: POLIXENI BROWE MRN: [id number] Date of Birth: 1945-04-05  03/15/2015  [name] here to pick up  Red 1:100 (Mite-CR-Weed) Following schedule: C  Frequency:1 time per week, at .50 every 4 weeks Epi-Pen:Epi-Pen Avaiable  Consent signed and patient instructions given.   Orpah Greek 03/15/2015, 2:53 PM

## 2015-06-20 LAB — BASIC METABOLIC PANEL: POTASSIUM: 2.7 mmol/L — AB (ref 3.4–5.3)

## 2015-08-10 ENCOUNTER — Encounter (HOSPITAL_BASED_OUTPATIENT_CLINIC_OR_DEPARTMENT_OTHER): Payer: Self-pay

## 2015-08-10 ENCOUNTER — Other Ambulatory Visit: Payer: Self-pay

## 2015-08-10 ENCOUNTER — Emergency Department (HOSPITAL_BASED_OUTPATIENT_CLINIC_OR_DEPARTMENT_OTHER)
Admission: EM | Admit: 2015-08-10 | Discharge: 2015-08-10 | Disposition: A | Discharge status: Home / Self Care | Payer: Medicare Other | Attending: Emergency Medicine | Admitting: Emergency Medicine

## 2015-08-10 DIAGNOSIS — E785 Hyperlipidemia, unspecified: Secondary | ICD-10-CM | POA: Insufficient documentation

## 2015-08-10 DIAGNOSIS — Z7982 Long term (current) use of aspirin: Secondary | ICD-10-CM | POA: Insufficient documentation

## 2015-08-10 DIAGNOSIS — Z87891 Personal history of nicotine dependence: Secondary | ICD-10-CM | POA: Diagnosis not present

## 2015-08-10 DIAGNOSIS — Z79899 Other long term (current) drug therapy: Secondary | ICD-10-CM | POA: Insufficient documentation

## 2015-08-10 DIAGNOSIS — R42 Dizziness and giddiness: Secondary | ICD-10-CM

## 2015-08-10 DIAGNOSIS — I10 Essential (primary) hypertension: Secondary | ICD-10-CM | POA: Diagnosis not present

## 2015-08-10 LAB — CBC WITH DIFFERENTIAL/PLATELET
BASOS ABS: 0 10*3/uL (ref 0.0–0.1)
Basophils Relative: 0 %
EOS PCT: 3 %
Eosinophils Absolute: 0.2 10*3/uL (ref 0.0–0.7)
HCT: 36.3 % (ref 36.0–46.0)
Hemoglobin: 11.7 g/dL — ABNORMAL LOW (ref 12.0–15.0)
LYMPHS PCT: 24 %
Lymphs Abs: 1.9 10*3/uL (ref 0.7–4.0)
MCH: 29.1 pg (ref 26.0–34.0)
MCHC: 32.2 g/dL (ref 30.0–36.0)
MCV: 90.3 fL (ref 78.0–100.0)
MONO ABS: 0.5 10*3/uL (ref 0.1–1.0)
Monocytes Relative: 6 %
Neutro Abs: 5.2 10*3/uL (ref 1.7–7.7)
Neutrophils Relative %: 67 %
PLATELETS: 342 10*3/uL (ref 150–400)
RBC: 4.02 MIL/uL (ref 3.87–5.11)
RDW: 14.7 % (ref 11.5–15.5)
WBC: 7.8 10*3/uL (ref 4.0–10.5)

## 2015-08-10 LAB — BASIC METABOLIC PANEL
Anion gap: 8 (ref 5–15)
BUN: 10 mg/dL (ref 6–20)
CALCIUM: 8.9 mg/dL (ref 8.9–10.3)
CO2: 29 mmol/L (ref 22–32)
CREATININE: 0.92 mg/dL (ref 0.44–1.00)
Chloride: 100 mmol/L — ABNORMAL LOW (ref 101–111)
GFR calc Af Amer: >60 mL/min (ref >60)
GLUCOSE: 106 mg/dL — AB (ref 65–99)
Potassium: 3.1 mmol/L — ABNORMAL LOW (ref 3.5–5.1)
Sodium: 137 mmol/L (ref 135–145)

## 2015-08-10 LAB — URINALYSIS, ROUTINE W REFLEX MICROSCOPIC
BILIRUBIN URINE: NEGATIVE
Glucose, UA: NEGATIVE mg/dL
HGB URINE DIPSTICK: NEGATIVE
Ketones, ur: NEGATIVE mg/dL
Leukocytes, UA: NEGATIVE
Nitrite: NEGATIVE
PH: 6.5 (ref 5.0–8.0)
Protein, ur: NEGATIVE mg/dL
SPECIFIC GRAVITY, URINE: 1.011 (ref 1.005–1.030)

## 2015-08-10 LAB — MAGNESIUM: Magnesium: 2 mg/dL (ref 1.7–2.4)

## 2015-08-10 LAB — TROPONIN I: Troponin I: <0.03 ng/mL (ref <0.031)

## 2015-08-10 MED ORDER — POTASSIUM CHLORIDE CRYS ER 20 MEQ PO TBCR
40.0000 meq | EXTENDED_RELEASE_TABLET | Freq: Once | ORAL | Status: AC
  Administered 2015-08-10: 40 meq via ORAL
  Filled 2015-08-10: qty 2

## 2015-08-10 NOTE — ED Notes (Signed)
C/o intermittent dizziness x 1 month-was noted to have hypokalemia x 1 month ago-is taking klorcon-presents to triage in w/c-NAD

## 2015-08-10 NOTE — Discharge Instructions (Signed)
Continue taking your potassium supplements as directed. Follow-up with your primary care doctor. Return to the ED for new or worsening symptoms.

## 2015-08-10 NOTE — ED Provider Notes (Signed)
CSN: [id number]     Arrival date & time 08/10/15  1935 History   First MD Initiated Contact with Patient 08/10/15 1947     Chief Complaint  Patient presents with  . Dizziness     (Consider location/radiation/quality/duration/timing/severity/associated sxs/prior Treatment) The history is provided by the patient and medical records.    70 year old female with history of hypertension, GERD, migraine headaches, hyperlipidemia, presenting to the ED for intermittent episodes of lightheadedness over the past month. She states she has been seen by her primary care physician for this twice already and was diagnosed with hypokalemia. She states she was instructed to increase her dose of Klor-Con at home. She states she had another episode of lightheadedness today after cleaning. She states she was cleaning the floor and went to stand up straight and felt lightheaded. She denies any loss of consciousness or feelings of syncope. No room spinning sensation.  No nausea/vomiting.  She denies any headache, confusion, focal numbness, or weakness. No difficulty walking. No visual disturbance. No fever, chills, or neck pain. No chest pain or shortness of breath. No history of TIA or stroke. She is not on any type of anticoagulation.  States she went to PCP office again today however lab staff had already left so was sent here for further evaluation.  VSS.  Past Medical History  Diagnosis Date  . Hypertension   . GERD (gastroesophageal reflux disease)   . Hiatal hernia   . Migraine headache   . Hyperlipidemia    Past Surgical History  Procedure Laterality Date  . Back surgery      x 3  . Cervical spine surgery    . Abdominal hysterectomy    . Carpal tunnel release      left wrist  . Hemorrhoid surgery     Family History  Problem Relation Age of Onset  . Heart attack Father 9  . Stomach cancer Maternal Grandmother   . Colon cancer Neg Hx   . Angioedema Neg Hx   . Asthma Neg Hx   . Eczema Neg Hx    . Urticaria Neg Hx   . Immunodeficiency Neg Hx   . Allergic rhinitis Sister    Social History  Substance Use Topics  . Smoking status: Former Smoker -- 0.50 packs/day for 20 years    Types: Cigarettes    Quit date: 02/19/1984  . Smokeless tobacco: Never Used  . Alcohol Use: No   OB History    No data available     Review of Systems  Neurological: Light-headedness: intermittent.  All other systems reviewed and are negative.     Allergies  Shellfish allergy; Ace inhibitors; Pitavastatin; Shellfish-derived products; Sulfamethoxazole; and Sulfa antibiotics  Home Medications   Prior to Admission medications   Medication Sig Start Date End Date Taking? Authorizing Provider  aspirin 81 MG tablet Take 81 mg by mouth daily.    Historical Provider, MD  fluticasone (FLONASE) 50 MCG/ACT nasal spray USE 2 SPRAYS IN NOSTRIL(S) DAILY AS NEEDED. 02/21/15   Historical Provider, MD  ipratropium (ATROVENT) 0.06 % nasal spray INHALE TWO SPRAYS IN EACH NOSTRIL 3-4 TIMES DAILY FOR FOUR DAYS 01/04/15   Historical Provider, MD  losartan-hydrochlorothiazide (HYZAAR) 100-25 MG per tablet Take 1 tablet by mouth daily.      Historical Provider, MD  lovastatin (MEVACOR) 20 MG tablet Take 20 mg by mouth.    Historical Provider, MD  Multiple Vitamins-Minerals (MULTIVITAL) tablet Take by mouth.    Historical Provider, MD  NONFORMULARY  OR COMPOUNDED ITEM Allergen immunotherapy 03/01/15   Leda Roys, MD  nortriptyline (PAMELOR) 25 MG capsule Take 25 mg by mouth at bedtime.    Historical Provider, MD  potassium chloride SA (K-DUR,KLOR-CON) 20 MEQ tablet Take 1 tablet (20 mEq total) by mouth daily. 06/18/12   Leonard Schwartz, MD   BP 163/74 mmHg  Pulse 80  Temp(Src) 97.8 F (36.6 C) (Oral)  Resp 18  Ht 5\' 6"  (1.676 m)  Wt 97.07 kg  BMI 34.56 kg/m2  SpO2 100%   Physical Exam  Constitutional: She is oriented to person, place, and time. She appears well-developed and well-nourished.  HENT:  Head:  Normocephalic and atraumatic.  Mouth/Throat: Oropharynx is clear and moist.  Eyes: Conjunctivae and EOM are normal. Pupils are equal, round, and reactive to light.  Neck: Normal range of motion.  Full ROM, no rigidity  Cardiovascular: Normal rate, regular rhythm and normal heart sounds.   Pulmonary/Chest: Effort normal and breath sounds normal.  Abdominal: Soft. Bowel sounds are normal.  Musculoskeletal: Normal range of motion.  Neurological: She is alert and oriented to person, place, and time.  AAOx3, answering questions and following commands appropriately; equal strength UE and LE bilaterally; CN grossly intact; moves all extremities appropriately without ataxia; no focal neuro deficits or facial asymmetry appreciated; normal gait  Skin: Skin is warm and dry.  Psychiatric: She has a normal mood and affect.  Nursing note and vitals reviewed.   ED Course  Procedures (including critical care time) Labs Review Labs Reviewed  CBC WITH DIFFERENTIAL/PLATELET - Abnormal; Notable for the following:    Hemoglobin 11.7 (*)    All other components within normal limits  BASIC METABOLIC PANEL - Abnormal; Notable for the following:    Potassium 3.1 (*)    Chloride 100 (*)    Glucose, Bld 106 (*)    All other components within normal limits  MAGNESIUM  TROPONIN I  URINALYSIS, ROUTINE W REFLEX MICROSCOPIC (NOT AT [facility])    Imaging Review No results found. I have personally reviewed and evaluated these images and lab results as part of my medical decision-making.   EKG Interpretation None      MDM   Final diagnoses:  Episodic lightheadedness   70 year old female here with intermittent lightheadedness over the past month. Seen by her PCP twice for this and was diagnosed with hypokalemia. This appears to be secondary to her hydrochlorothiazide.  Episode of lightheadedness today occurred while cleaning the floors and then abruptly standing upright.  No room spinning, focal numbness, or  focal weakness.  No feelings of syncope or loss of consciousness. Patient is afebrile, nontoxic. Her neurologic exam is nonfocal.  She is awake, alert, oriented her baseline. No dizziness or lightheadedness currently.  Labwork today is overall reassuring aside from a mild hypokalemia at 3.1. This was replaced in the ED. Patient has no chest pain or shortness of breath so do not suspect ACS. No neurologic deficits to suggest TIA, stroke, ICH, SAH, meningitis, or other emergent intracranial pathology.  Orthostatic vitals normal.  D/c home with PCP follow-up.  Discussed plan with patient, he/she acknowledged understanding and agreed with plan of care.  Return precautions given for new or worsening symptoms.  Case discussed with attending physician, Dr. Tyrone Nine, who evaluated patient and agrees with assessment and plan of care.  Larene Pickett, PA-C 08/10/15 [place], DO 08/10/15 [place], DO 08/10/15 2300

## 2015-08-14 ENCOUNTER — Other Ambulatory Visit (HOSPITAL_BASED_OUTPATIENT_CLINIC_OR_DEPARTMENT_OTHER): Payer: Self-pay | Admitting: Family Medicine

## 2015-08-14 DIAGNOSIS — Z1231 Encounter for screening mammogram for malignant neoplasm of breast: Secondary | ICD-10-CM

## 2015-08-17 ENCOUNTER — Ambulatory Visit (HOSPITAL_BASED_OUTPATIENT_CLINIC_OR_DEPARTMENT_OTHER)
Admission: RE | Admit: 2015-08-17 | Discharge: 2015-08-17 | Disposition: A | Discharge status: Home / Self Care | Payer: Medicare Other | Source: Ambulatory Visit | Attending: Family Medicine | Admitting: Family Medicine

## 2015-08-17 DIAGNOSIS — Z1231 Encounter for screening mammogram for malignant neoplasm of breast: Secondary | ICD-10-CM | POA: Diagnosis not present

## 2015-08-28 ENCOUNTER — Ambulatory Visit: Payer: Medicare Other | Admitting: Medical

## 2015-08-30 LAB — BASIC METABOLIC PANEL
BUN: 13 mg/dL (ref 4–21)
CREATININE: 0.9 mg/dL (ref 0.5–1.1)
GLUCOSE: 91 mg/dL
POTASSIUM: 3.2 mmol/L — AB (ref 3.4–5.3)
SODIUM: 144 mmol/L (ref 137–147)

## 2015-08-30 LAB — HEPATIC FUNCTION PANEL
ALT: 12 U/L (ref 7–35)
AST: 18 U/L (ref 13–35)
Alkaline Phosphatase: 72 U/L (ref 25–125)
BILIRUBIN, TOTAL: 0.5 mg/dL

## 2015-08-30 LAB — LIPID PANEL
CHOLESTEROL: 170 mg/dL (ref 0–200)
HDL: 61 mg/dL (ref 35–70)
LDL Cholesterol: 92 mg/dL
LDL Cholesterol: 92 mg/dL
TRIGLYCERIDES: 67 mg/dL (ref 40–160)

## 2015-09-05 DIAGNOSIS — J3089 Other allergic rhinitis: Secondary | ICD-10-CM | POA: Diagnosis not present

## 2015-09-12 ENCOUNTER — Ambulatory Visit (INDEPENDENT_AMBULATORY_CARE_PROVIDER_SITE_OTHER): Payer: Medicare Other | Admitting: *Deleted

## 2015-09-12 ENCOUNTER — Ambulatory Visit: Payer: Medicare Other

## 2015-09-12 DIAGNOSIS — J309 Allergic rhinitis, unspecified: Secondary | ICD-10-CM

## 2015-09-12 NOTE — Progress Notes (Signed)
Immunotherapy   Patient Details  Name: Stephanie Parks MRN: [id number] Date of Birth: 1946/01/08  09/12/2015  [name] here to pick up  MITE-COCKROACH-WEED Following schedule: C  Frequency:1 time per week @ 0.50 EVERY 4 WEEKS Epi-Pen:Epi-Pen Available  Consent signed and patient instructions given. Patient will fax previous shot records.  [name] 09/12/2015, 3:05 PM

## 2015-10-06 ENCOUNTER — Telehealth: Payer: Self-pay | Admitting: Behavioral Health

## 2015-10-06 NOTE — Telephone Encounter (Signed)
Unable to reach patient at time of Pre-Visit Call.  Left message for patient to return call when available.    

## 2015-10-09 ENCOUNTER — Encounter: Payer: Self-pay | Admitting: Family Medicine

## 2015-10-09 ENCOUNTER — Ambulatory Visit (INDEPENDENT_AMBULATORY_CARE_PROVIDER_SITE_OTHER): Payer: Medicare Other | Admitting: Family Medicine

## 2015-10-09 ENCOUNTER — Ambulatory Visit (HOSPITAL_BASED_OUTPATIENT_CLINIC_OR_DEPARTMENT_OTHER)
Admission: RE | Admit: 2015-10-09 | Discharge: 2015-10-09 | Disposition: A | Discharge status: Home / Self Care | Payer: Medicare Other | Source: Ambulatory Visit | Attending: Family Medicine | Admitting: Family Medicine

## 2015-10-09 VITALS — BP 110/58 | HR 89 | Temp 98.0°F | Ht 66.0 in | Wt 214.6 lb

## 2015-10-09 DIAGNOSIS — H9191 Unspecified hearing loss, right ear: Secondary | ICD-10-CM | POA: Diagnosis not present

## 2015-10-09 DIAGNOSIS — I952 Hypotension due to drugs: Secondary | ICD-10-CM | POA: Diagnosis not present

## 2015-10-09 DIAGNOSIS — M25551 Pain in right hip: Secondary | ICD-10-CM | POA: Insufficient documentation

## 2015-10-09 DIAGNOSIS — M47896 Other spondylosis, lumbar region: Secondary | ICD-10-CM | POA: Insufficient documentation

## 2015-10-09 NOTE — Patient Instructions (Addendum)
It was very nice to see you today- please do get a copy of your labs for me when you can Your BP is low- please cut your current BP medication (Losartan/ HCTZ) in half. As long as you are taking a 1/2 dose of this medication also decrease your potassium pill to a 1/2 tablet or one every other day  Please check your BP at a local drug store every 2-3 days and let me know how your numbers look We would like to see you between 120- 140/ 75- 85. We can further adjust your BP medication as needed  Please go downstairs for your hip x-ray and I will let you know how it looks asap If you hip looks ok your problem is likely more in your back  Please contact your ENT doctor and make an appt for her to look at your ear

## 2015-10-09 NOTE — Progress Notes (Signed)
Pre visit review using our clinic review tool, if applicable. No additional management support is needed unless otherwise documented below in the visit note. 

## 2015-10-09 NOTE — Progress Notes (Addendum)
[facility] at [facility] [address], [place], [place] [address] [phone]  Date:  10/09/2015   Name:  Stephanie Parks   DOB:  07-19-1945   MRN:  [id number]  PCP:  Lamar Blinks, MD    Chief Complaint: Establish Care (Pt here to est care. c/o pain and numbness in legs and thighs x 1 month. Pt declined shingles, pneumonia )   History of Present Illness:  Stephanie Parks is a 70 y.o. very pleasant female patient who presents with the following:  History of CAD, aneurysm, high cholesterol, HTN Her GI is Dr. Hilarie Fredrickson.   She has seen Dr. Angelena Form with cardiology in the past.  She is on asa and statin, negative treadmill in 2013.  She never had a stent or other intervention  Her BP is a bit low today- she does not check it at home.  She is not feeling lightheaded or having any other symptoms of hypotension.  No CP or SOB  She has been a [facility] pt but would like to come and see [place] due to turnover issues.    She has noted decreased feeling/ plugged up feeling in her right ear only. The left ear is ok No pain in her ear.  No injury to the ear, no discharge  About a month ago she noted pain in both legs, and a numb feeling in the right inguinal fold and left lateral thigh. She will notice a burning feeling. This has been present for about one month now   This problem is still present daily.   Never had this in the past.   Her legs will feel stiff first thing in the morning, but she does not really notice any weakness She is using tylenol which does help some.  She cannot use NSAIDs due to stomahc issues The right hip is the most painful.   No bowel or bladder incont She is not aware of any acute injury, but she did fall about one year ago.  She has been told that she had a "bulging disc" when she had an MRI last year. She has had back surgery twice. Her NS is Dr. Christella Noa.  She had a back operation twice in 1998 for a ruptured disc.     She reports labs last month at [facility], thyroid, CBC.  She thinks that she can find the results and will send them to me  She is a pt of Dr. Emilio Math for ENT care.   She is a former smoker.  She works at [employer] a couple of days a week  She did have knee injections earlier this year somewhere in [place]- this was helpful with her knee pain\   BP Readings from Last 3 Encounters:  10/09/15 (!) 110/58  08/10/15 138/78  03/01/15 120/78    Patient Active Problem List   Diagnosis Date Noted  . 3-vessel CAD 03/01/2015  . Aneurysm ([place]) 03/01/2015  . Dizziness 03/01/2015  . Hypercholesterolemia 03/01/2015  . BP (high blood pressure) 03/01/2015  . Decreased potassium in the blood 03/01/2015  . Paresthesia of arm 03/01/2015  . Allergic rhinitis due to pollen 03/01/2015  . Achalasia 09/28/2012  . CN (constipation) 09/28/2012  . Cephalalgia 08/24/2012  . HTN (hypertension) 04/19/2011  . Hyperlipidemia 04/19/2011  . GERD (gastroesophageal reflux disease) 04/19/2011  . Chest pain 04/04/2011    Past Medical History:  Diagnosis Date  . GERD (gastroesophageal reflux disease)   .  Hiatal hernia   . Hyperlipidemia   . Hypertension   . Migraine headache     Past Surgical History:  Procedure Laterality Date  . ABDOMINAL HYSTERECTOMY    . BACK SURGERY     x 3  . CARPAL TUNNEL RELEASE     left wrist  . CERVICAL SPINE SURGERY    . HEMORRHOID SURGERY      Social History  Substance Use Topics  . Smoking status: Former Smoker    Packs/day: 0.50    Years: 20.00    Types: Cigarettes    Quit date: 02/19/1984  . Smokeless tobacco: Never Used  . Alcohol use No    Family History  Problem Relation Age of Onset  . Heart attack Father 73  . Stomach cancer Maternal Grandmother   . Colon cancer Neg Hx   . Angioedema Neg Hx   . Asthma Neg Hx   . Eczema Neg Hx   . Urticaria Neg Hx   . Immunodeficiency Neg Hx   . Allergic rhinitis Sister     Allergies  Allergen  Reactions  . Shellfish Allergy Hives and Swelling  . Ace Inhibitors Other (See Comments)  . Pitavastatin   . Shellfish-Derived Products Other (See Comments)    Hives Hives  . Sulfamethoxazole Other (See Comments)    Hives  . Sulfa Antibiotics Other (See Comments) and Rash    Fine bumps    Medication list has been reviewed and updated.  Current Outpatient Prescriptions on File Prior to Visit  Medication Sig Dispense Refill  . aspirin 81 MG tablet Take 81 mg by mouth daily.    . fluticasone (FLONASE) 50 MCG/ACT nasal spray USE 2 SPRAYS IN NOSTRIL(S) DAILY AS NEEDED.  3  . losartan-hydrochlorothiazide (HYZAAR) 100-25 MG per tablet Take 1 tablet by mouth daily.      Marland Kitchen lovastatin (MEVACOR) 20 MG tablet Take 20 mg by mouth.    . Multiple Vitamins-Minerals (MULTIVITAL) tablet Take by mouth.    . NONFORMULARY OR COMPOUNDED ITEM Allergen immunotherapy (Patient taking differently: Allergy shots (new vial: shot given weekly for four weeks) Then once every four weeks.) 2 each 1  . nortriptyline (PAMELOR) 25 MG capsule Take 25 mg by mouth at bedtime.    . potassium chloride SA (K-DUR,KLOR-CON) 20 MEQ tablet Take 1 tablet (20 mEq total) by mouth daily. 15 tablet 0  . [DISCONTINUED] esomeprazole (NEXIUM) 40 MG capsule Take 1 capsule (40 mg total) by mouth daily. 30 capsule 3   No current facility-administered medications on file prior to visit.     Review of Systems:  As per HPI- otherwise negative.   Physical Examination: Vitals:   10/09/15 0935  BP: (!) 110/58  Pulse: 89  Temp: 98 F (36.7 C)   Vitals:   10/09/15 0935  Weight: 214 lb 9.6 oz (97.3 kg)  Height: 5\' 6"  (1.676 m)   Body mass index is 34.64 kg/m. Ideal Body Weight: Weight in (lb) to have BMI = 25: 154.6  GEN: WDWN, NAD, Non-toxic, A & O x 3, overweight, looks well HEENT: Atraumatic, Normocephalic. Neck supple. No masses, No LAD.  Bilateral TM wnl, oropharynx normal.  PEERL,EOMI.  No abnl noted in the right ear Ears  and Nose: No external deformity. CV: RRR, No M/G/R. No JVD. No thrill. No extra heart sounds. PULM: CTA B, no wheezes, crackles, rhonchi. No retractions. No resp. distress. No accessory muscle use. ABD: S, NT, ND, +BS. No rebound. No HSM. EXTR: No c/c/e NEURO  Normal gait.  PSYCH: Normally interactive. Conversant. Not depressed or anxious appearing.  Calm demeanor.  Normal BUE and BLE strength, sensation and DTR.  Normal BLE DTR.  Negative SLR.   No pain with internal or external rotation, flexion of either hip.  She does have some tenderness over the lateral left thigh- likely involving the IT band.   Normal spinal flexion and extension   Assessment and Plan: Right hip pain - Plan: DG HIP UNILAT W OR W/O PELVIS 2-3 VIEWS RIGHT  Hypotension due to drugs  Hearing loss, right  Here today with a few concerns.  Will obtain hip films- assuming these are negative I suspect the problem is actually in her back.   Her BP is low- she denies any sx of same however.  Will decrease her BP medication and also her K supplementation; she will monitor her BP and give me a report in a week or so She will see her ENT for formal hearing testing- no evidence of cerumen to explain her sx   Signed Lamar Blinks, MD  Received her hip films, called 8/22 to go over them with her.  LMOM- she does have some arthritis in her hips.  Please see Dr. Cyndy Freeze- if he feels not her back I will refer to ortho.  Will send her a letter as well  Dg Hip Unilat W Or W/o Pelvis 2-3 Views Right  Result Date: 10/09/2015 CLINICAL DATA:  Hip pain.  No known injury.  Initial evaluation. EXAM: DG HIP (WITH OR WITHOUT PELVIS) 2-3V RIGHT COMPARISON:  10/31/2014.  07/22/2014. FINDINGS: Degenerative changes lumbar spine and both hips. No acute bony or joint abnormality identified. No evidence of fracture or dislocation. Calcifications in pelvis consistent phleboliths. IMPRESSION: Degenerative changes lumbar spine and both hips. No acute  abnormality identified . Electronically Signed   By: Marcello Moores  Register   On: 10/09/2015 10:55

## 2015-10-10 ENCOUNTER — Encounter: Payer: Self-pay | Admitting: Family Medicine

## 2015-10-24 ENCOUNTER — Telehealth: Payer: Self-pay | Admitting: Family Medicine

## 2015-10-24 NOTE — Telephone Encounter (Signed)
°  Relationship to patient: Self Can be reached: [phone]  Reason for call: BP is 140/77 and left foot is swollen. States PCP wanted her to report her BP

## 2015-10-25 NOTE — Telephone Encounter (Signed)
Called her back- we had her decrease her losartan/ hctz at her last visit due to hypotension.  No answer.  LMOM- BP of 140/77 is better than her being too low.  If both feet are swollen it maybe due to being on less fluid pill- if only one foot swollen it may be something else.  If this persists for the next day or so please see me.  Otherwise please recheck in 3-4 weeks

## 2015-10-27 ENCOUNTER — Other Ambulatory Visit: Payer: Self-pay | Admitting: Family Medicine

## 2015-10-27 NOTE — Progress Notes (Unsigned)
Pre visit review using our clinic review tool, if applicable. No additional management support is needed unless otherwise documented below in the visit note. 

## 2015-11-03 ENCOUNTER — Other Ambulatory Visit: Payer: Self-pay | Admitting: Family Medicine

## 2015-11-06 NOTE — Progress Notes (Signed)
Normal results, see office note

## 2015-12-29 ENCOUNTER — Telehealth: Payer: Self-pay | Admitting: Family Medicine

## 2015-12-29 ENCOUNTER — Encounter: Payer: Self-pay | Admitting: Family Medicine

## 2015-12-29 NOTE — Telephone Encounter (Signed)
error:[id number] ° °

## 2015-12-29 NOTE — Telephone Encounter (Signed)
Relation to [id number] Call back number:[phone] [id number] [place], [place] [phone] (Fax)    Reason for call:  Patient requesting a refill potassium chloride SA (K-DUR,KLOR-CON) 20 MEQ tablet

## 2016-01-01 ENCOUNTER — Other Ambulatory Visit: Payer: Self-pay | Admitting: Emergency Medicine

## 2016-01-01 MED ORDER — POTASSIUM CHLORIDE CRYS ER 20 MEQ PO TBCR: 20.0000 meq | EXTENDED_RELEASE_TABLET | Freq: Every day | ORAL | 1 refills | Status: DC

## 2016-01-02 ENCOUNTER — Other Ambulatory Visit: Payer: Self-pay | Admitting: Family Medicine

## 2016-01-02 MED ORDER — POTASSIUM CHLORIDE CRYS ER 20 MEQ PO TBCR: EXTENDED_RELEASE_TABLET | ORAL | 1 refills | Status: DC

## 2016-02-20 DIAGNOSIS — M5441 Lumbago with sciatica, right side: Secondary | ICD-10-CM | POA: Diagnosis not present

## 2016-02-20 DIAGNOSIS — I10 Essential (primary) hypertension: Secondary | ICD-10-CM | POA: Diagnosis not present

## 2016-02-22 ENCOUNTER — Other Ambulatory Visit: Payer: Self-pay | Admitting: Neurosurgery

## 2016-02-22 DIAGNOSIS — M5441 Lumbago with sciatica, right side: Secondary | ICD-10-CM

## 2016-03-02 ENCOUNTER — Ambulatory Visit
Admission: RE | Admit: 2016-03-02 | Discharge: 2016-03-02 | Disposition: A | Discharge status: Home / Self Care | Payer: Medicare Other | Source: Ambulatory Visit | Attending: Neurosurgery | Admitting: Neurosurgery

## 2016-03-02 DIAGNOSIS — M5126 Other intervertebral disc displacement, lumbar region: Secondary | ICD-10-CM | POA: Diagnosis not present

## 2016-03-02 DIAGNOSIS — M5441 Lumbago with sciatica, right side: Secondary | ICD-10-CM

## 2016-03-02 MED ORDER — GADOBENATE DIMEGLUMINE 529 MG/ML IV SOLN
20.0000 mL | Freq: Once | INTRAVENOUS | Status: AC | PRN
  Administered 2016-03-02: 19 mL via INTRAVENOUS

## 2016-03-04 ENCOUNTER — Other Ambulatory Visit: Payer: Self-pay | Admitting: Family Medicine

## 2016-03-05 DIAGNOSIS — M4726 Other spondylosis with radiculopathy, lumbar region: Secondary | ICD-10-CM | POA: Diagnosis not present

## 2016-03-20 DIAGNOSIS — J3089 Other allergic rhinitis: Secondary | ICD-10-CM | POA: Diagnosis not present

## 2016-03-23 ENCOUNTER — Emergency Department (HOSPITAL_BASED_OUTPATIENT_CLINIC_OR_DEPARTMENT_OTHER): Payer: Medicare Other

## 2016-03-23 ENCOUNTER — Encounter (HOSPITAL_BASED_OUTPATIENT_CLINIC_OR_DEPARTMENT_OTHER): Payer: Self-pay | Admitting: Emergency Medicine

## 2016-03-23 ENCOUNTER — Emergency Department (HOSPITAL_BASED_OUTPATIENT_CLINIC_OR_DEPARTMENT_OTHER)
Admission: EM | Admit: 2016-03-23 | Discharge: 2016-03-23 | Disposition: A | Discharge status: Home / Self Care | Payer: Medicare Other | Attending: Emergency Medicine | Admitting: Emergency Medicine

## 2016-03-23 DIAGNOSIS — Y9389 Activity, other specified: Secondary | ICD-10-CM | POA: Insufficient documentation

## 2016-03-23 DIAGNOSIS — Z79899 Other long term (current) drug therapy: Secondary | ICD-10-CM | POA: Diagnosis not present

## 2016-03-23 DIAGNOSIS — Z7982 Long term (current) use of aspirin: Secondary | ICD-10-CM | POA: Diagnosis not present

## 2016-03-23 DIAGNOSIS — Y929 Unspecified place or not applicable: Secondary | ICD-10-CM | POA: Diagnosis not present

## 2016-03-23 DIAGNOSIS — M25461 Effusion, right knee: Secondary | ICD-10-CM | POA: Diagnosis not present

## 2016-03-23 DIAGNOSIS — W109XXA Fall (on) (from) unspecified stairs and steps, initial encounter: Secondary | ICD-10-CM | POA: Diagnosis not present

## 2016-03-23 DIAGNOSIS — S8392XA Sprain of unspecified site of left knee, initial encounter: Secondary | ICD-10-CM | POA: Diagnosis not present

## 2016-03-23 DIAGNOSIS — Y999 Unspecified external cause status: Secondary | ICD-10-CM | POA: Diagnosis not present

## 2016-03-23 DIAGNOSIS — S8992XA Unspecified injury of left lower leg, initial encounter: Secondary | ICD-10-CM | POA: Diagnosis present

## 2016-03-23 DIAGNOSIS — I10 Essential (primary) hypertension: Secondary | ICD-10-CM | POA: Insufficient documentation

## 2016-03-23 DIAGNOSIS — Z87891 Personal history of nicotine dependence: Secondary | ICD-10-CM | POA: Insufficient documentation

## 2016-03-23 DIAGNOSIS — S8391XA Sprain of unspecified site of right knee, initial encounter: Secondary | ICD-10-CM | POA: Diagnosis not present

## 2016-03-23 MED ORDER — HYDROCODONE-ACETAMINOPHEN 5-325 MG PO TABS: 1.0000 | ORAL_TABLET | Freq: Four times a day (QID) | ORAL | 0 refills | Status: DC | PRN

## 2016-03-23 NOTE — ED Notes (Signed)
ED Provider at bedside. 

## 2016-03-23 NOTE — ED Triage Notes (Signed)
Pt fell last night. States she missed a step while going down the stairs falling down 1 step. Denies LOC and did not hit her head. C/o R knee pain.

## 2016-03-23 NOTE — ED Notes (Signed)
Patient transported to X-ray 

## 2016-03-23 NOTE — Discharge Instructions (Signed)
Knee immobilizer as applied.  Keep your leg elevated and ice for 20 minutes every 2 hours while awake for the next 2 days.  Hydrocodone as prescribed as needed for pain.  Follow-up with your primary Dr. in the next 1-2 weeks if not improving to discuss further imaging.

## 2016-03-23 NOTE — ED Provider Notes (Signed)
Stephanie Parks DEPT [facility] Provider Note   CSN: [id number] Arrival date & time: 03/23/16  [id number]     History   Chief Complaint Chief Complaint  Patient presents with  . Fall    HPI Stephanie Parks is a 71 y.o. female.  Patient is a 71 year old female with past medical history of hypertension, coronary artery disease, migraines. She presents for evaluation of a right knee injury. She reports missing a step going down stairs yesterday evening. She fell forward onto her knees. She has had pain and swelling in the right knee that is making it difficult for her to walk. She denies any other injury.   The history is provided by the patient.  Fall  This is a new problem. The current episode started yesterday. The problem occurs constantly. The problem has not changed since onset.The symptoms are aggravated by walking (Bearing weight). The symptoms are relieved by rest. She has tried nothing for the symptoms.    Past Medical History:  Diagnosis Date  . GERD (gastroesophageal reflux disease)   . Hiatal hernia   . Hyperlipidemia   . Hypertension   . Migraine headache     Patient Active Problem List   Diagnosis Date Noted  . 3-vessel CAD 03/01/2015  . Aneurysm ([place]) 03/01/2015  . Dizziness 03/01/2015  . Hypercholesterolemia 03/01/2015  . BP (high blood pressure) 03/01/2015  . Decreased potassium in the blood 03/01/2015  . Paresthesia of arm 03/01/2015  . Allergic rhinitis due to pollen 03/01/2015  . Achalasia 09/28/2012  . CN (constipation) 09/28/2012  . Cephalalgia 08/24/2012  . HTN (hypertension) 04/19/2011  . Hyperlipidemia 04/19/2011  . GERD (gastroesophageal reflux disease) 04/19/2011  . Chest pain 04/04/2011    Past Surgical History:  Procedure Laterality Date  . ABDOMINAL HYSTERECTOMY    . BACK SURGERY     x 3  . CARPAL TUNNEL RELEASE     left wrist  . CERVICAL SPINE SURGERY    . HEMORRHOID SURGERY      OB History    No data available       Home  Medications    Prior to Admission medications   Medication Sig Start Date End Date Taking? Authorizing Provider  omeprazole (PRILOSEC) 20 MG capsule Take 20 mg by mouth daily.   Yes Historical Provider, MD  pantoprazole (PROTONIX) 20 MG tablet Take 20 mg by mouth as needed.   Yes Historical Provider, MD  aspirin 81 MG tablet Take 81 mg by mouth daily.    Historical Provider, MD  fluticasone (FLONASE) 50 MCG/ACT nasal spray USE 2 SPRAYS IN NOSTRIL(S) DAILY AS NEEDED. 02/21/15   Historical Provider, MD  KLOR-CON M20 20 MEQ tablet TAKE 1/2 OR 1 PILL DAILY 03/04/16   Gay Filler Copland, MD  losartan-hydrochlorothiazide (HYZAAR) 100-25 MG per tablet Take 1 tablet by mouth daily.      Historical Provider, MD  lovastatin (MEVACOR) 20 MG tablet Take 20 mg by mouth.    Historical Provider, MD  Multiple Vitamins-Minerals (MULTIVITAL) tablet Take by mouth.    Historical Provider, MD  NONFORMULARY OR COMPOUNDED ITEM Allergen immunotherapy Patient taking differently: Allergy shots (new vial: shot given weekly for four weeks) Then once every four weeks. 03/01/15   Leda Roys, MD  nortriptyline (PAMELOR) 25 MG capsule Take 25 mg by mouth at bedtime.    Historical Provider, MD    Family History Family History  Problem Relation Age of Onset  . Allergic rhinitis Sister   . Heart attack Father 36  .  Stomach cancer Maternal Grandmother   . Colon cancer Neg Hx   . Angioedema Neg Hx   . Asthma Neg Hx   . Eczema Neg Hx   . Urticaria Neg Hx   . Immunodeficiency Neg Hx     Social History Social History  Substance Use Topics  . Smoking status: Former Smoker    Packs/day: 0.50    Years: 20.00    Types: Cigarettes    Quit date: 02/19/1984  . Smokeless tobacco: Never Used  . Alcohol use No     Allergies   Shellfish allergy; Ace inhibitors; Pitavastatin; Shellfish-derived products; Sulfamethoxazole; and Sulfa antibiotics   Review of Systems Review of Systems  All other systems reviewed and are  negative.    Physical Exam Updated Vital Signs BP 141/75 (BP Location: Right Arm)   Pulse 86   Temp 97.9 F (36.6 C) (Oral)   Resp 18   Ht 5\' 5"  (1.651 m)   Wt 203 lb (92.1 kg)   SpO2 100%   BMI 33.78 kg/m   Physical Exam  Constitutional: She is oriented to person, place, and time. She appears well-developed and well-nourished. No distress.  HENT:  Head: Normocephalic and atraumatic.  Neck: Normal range of motion. Neck supple.  Musculoskeletal:  The right knee appears to have a moderate-sized effusion. There is otherwise no gross abnormality. DP pulses are palpable to the right foot and sensation and motor are intact to the entire extremity.  Neurological: She is alert and oriented to person, place, and time.  Skin: Skin is warm and dry. She is not diaphoretic.  Nursing note and vitals reviewed.    ED Treatments / Results  Labs (all labs ordered are listed, but only abnormal results are displayed) Labs Reviewed - No data to display  EKG  EKG Interpretation None       Radiology No results found.  Procedures Procedures (including critical care time)  Medications Ordered in ED Medications - No data to display   Initial Impression / Assessment and Plan / ED Course  I have reviewed the triage vital signs and the nursing notes.  Pertinent labs & imaging results that were available during my care of the patient were reviewed by me and considered in my medical decision making (see chart for details).  X-rays are negative for fracture, however do show a knee effusion. On exam, there is no significant instability with anterior or posterior drawer test and there is no significant laxity with varus or valgus stress. There is no crepitus on exam.  She will be discharged with a knee immobilizer, pain medication, rest, ice, elevation, and follow-up with primary Dr. if not improving in the next 1-2 weeks.  Final Clinical Impressions(s) / ED Diagnoses   Final diagnoses:    None    New Prescriptions New Prescriptions   No medications on file     Veryl Speak, MD 03/23/16 1058

## 2016-04-01 DIAGNOSIS — M4726 Other spondylosis with radiculopathy, lumbar region: Secondary | ICD-10-CM | POA: Diagnosis not present

## 2016-04-01 DIAGNOSIS — M5416 Radiculopathy, lumbar region: Secondary | ICD-10-CM | POA: Diagnosis not present

## 2016-04-04 ENCOUNTER — Ambulatory Visit (INDEPENDENT_AMBULATORY_CARE_PROVIDER_SITE_OTHER): Payer: Medicare Other | Admitting: *Deleted

## 2016-04-04 DIAGNOSIS — J309 Allergic rhinitis, unspecified: Secondary | ICD-10-CM | POA: Diagnosis not present

## 2016-04-04 NOTE — Progress Notes (Signed)
Immunotherapy   Patient Details  Name: Stephanie Parks MRN: [id number] Date of Birth: May 19, 1945  04/04/2016  Stephanie Parks here to pick up  Red vial 1:100 [place]-C[place]-Weed Following schedule: C  Frequency: Every 4 weeks at [facility]  Epi-Pen:Epi-Pen Available  Consent signed and patient instructions given. Patient given 0.1cc in right arm and waited 30 minutes with no problems. Patient takes vial out of office, patient states her daughter gives her shots.   [name] 04/04/2016, 1:39 PM

## 2016-04-08 ENCOUNTER — Other Ambulatory Visit: Payer: Self-pay | Admitting: Emergency Medicine

## 2016-04-08 ENCOUNTER — Telehealth: Payer: Self-pay | Admitting: Family Medicine

## 2016-04-08 MED ORDER — NORTRIPTYLINE HCL 25 MG PO CAPS: 25.0000 mg | ORAL_CAPSULE | Freq: Every day | ORAL | 3 refills | Status: DC

## 2016-04-08 NOTE — Telephone Encounter (Signed)
Relation to [id number] Call back number:[phone] [id number] [place], [place] [phone] (Phone) [phone] (Fax)      Reason for call:  Patient requesting a refill nortriptyline (PAMELOR) 25 MG capsule

## 2016-04-08 NOTE — Telephone Encounter (Signed)
Received refill request for nortriptyline (PAMELOR) 25 MG capsule. Last office visit 10/09/15. No date listed for last refill. Is it ok to refill? Please advise.

## 2016-04-09 ENCOUNTER — Encounter: Payer: Self-pay | Admitting: Family Medicine

## 2016-04-09 NOTE — Telephone Encounter (Signed)
error:[id number] ° °

## 2016-04-17 NOTE — Addendum Note (Signed)
Addended by: Orpah Greek D on: 04/17/2016 11:28 AM   Modules accepted: Orders

## 2016-04-25 DIAGNOSIS — M5416 Radiculopathy, lumbar region: Secondary | ICD-10-CM | POA: Diagnosis not present

## 2016-04-25 DIAGNOSIS — M4726 Other spondylosis with radiculopathy, lumbar region: Secondary | ICD-10-CM | POA: Diagnosis not present

## 2016-05-01 ENCOUNTER — Ambulatory Visit (INDEPENDENT_AMBULATORY_CARE_PROVIDER_SITE_OTHER): Payer: Medicare Other | Admitting: Family Medicine

## 2016-05-01 VITALS — BP 138/72 | HR 91 | Temp 98.5°F | Ht 66.0 in | Wt 203.6 lb

## 2016-05-01 DIAGNOSIS — Z5181 Encounter for therapeutic drug level monitoring: Secondary | ICD-10-CM

## 2016-05-01 DIAGNOSIS — Z131 Encounter for screening for diabetes mellitus: Secondary | ICD-10-CM

## 2016-05-01 DIAGNOSIS — Z130 Encounter for screening for diseases of the blood and blood-forming organs and certain disorders involving the immune mechanism: Secondary | ICD-10-CM

## 2016-05-01 DIAGNOSIS — Z1159 Encounter for screening for other viral diseases: Secondary | ICD-10-CM | POA: Diagnosis not present

## 2016-05-01 DIAGNOSIS — E785 Hyperlipidemia, unspecified: Secondary | ICD-10-CM | POA: Diagnosis not present

## 2016-05-01 DIAGNOSIS — L089 Local infection of the skin and subcutaneous tissue, unspecified: Secondary | ICD-10-CM

## 2016-05-01 DIAGNOSIS — S0032XA Blister (nonthermal) of nose, initial encounter: Secondary | ICD-10-CM

## 2016-05-01 DIAGNOSIS — I10 Essential (primary) hypertension: Secondary | ICD-10-CM | POA: Diagnosis not present

## 2016-05-01 LAB — LIPID PANEL
CHOL/HDL RATIO: 3
Cholesterol: 189 mg/dL (ref 0–200)
HDL: 60.6 mg/dL (ref >39.00)
LDL Cholesterol: 98 mg/dL (ref 0–99)
NONHDL: 128.14
Triglycerides: 149 mg/dL (ref 0.0–149.0)
VLDL: 29.8 mg/dL (ref 0.0–40.0)

## 2016-05-01 LAB — COMPREHENSIVE METABOLIC PANEL
ALT: 12 U/L (ref 0–35)
AST: 16 U/L (ref 0–37)
Albumin: 3.8 g/dL (ref 3.5–5.2)
Alkaline Phosphatase: 65 U/L (ref 39–117)
BUN: 14 mg/dL (ref 6–23)
CO2: 36 meq/L — AB (ref 19–32)
CREATININE: 0.77 mg/dL (ref 0.40–1.20)
Calcium: 9.9 mg/dL (ref 8.4–10.5)
Chloride: 99 mEq/L (ref 96–112)
GFR: 95.16 mL/min (ref >60.00)
GLUCOSE: 99 mg/dL (ref 70–99)
Potassium: 3.3 mEq/L — ABNORMAL LOW (ref 3.5–5.1)
Sodium: 142 mEq/L (ref 135–145)
Total Bilirubin: 0.3 mg/dL (ref 0.2–1.2)
Total Protein: 7 g/dL (ref 6.0–8.3)

## 2016-05-01 LAB — CBC
HEMATOCRIT: 34.8 % — AB (ref 36.0–46.0)
HEMOGLOBIN: 11.6 g/dL — AB (ref 12.0–15.0)
MCHC: 33.2 g/dL (ref 30.0–36.0)
MCV: 87.7 fl (ref 78.0–100.0)
PLATELETS: 397 10*3/uL (ref 150.0–400.0)
RBC: 3.97 Mil/uL (ref 3.87–5.11)
RDW: 15.3 % (ref 11.5–15.5)
WBC: 9.1 10*3/uL (ref 4.0–10.5)

## 2016-05-01 LAB — HEMOGLOBIN A1C: Hgb A1c MFr Bld: 5.9 % (ref 4.6–6.5)

## 2016-05-01 MED ORDER — MUPIROCIN 2 % EX OINT: TOPICAL_OINTMENT | CUTANEOUS | 0 refills | Status: DC

## 2016-05-01 MED ORDER — VALACYCLOVIR HCL 1 G PO TABS: ORAL_TABLET | ORAL | 0 refills | Status: DC

## 2016-05-01 MED ORDER — CEPHALEXIN 500 MG PO CAPS: 500.0000 mg | ORAL_CAPSULE | Freq: Three times a day (TID) | ORAL | 0 refills | Status: DC

## 2016-05-01 NOTE — Progress Notes (Signed)
Pre visit review using our clinic review tool, if applicable. No additional management support is needed unless otherwise documented below in the visit note. 

## 2016-05-01 NOTE — Patient Instructions (Addendum)
We will get labs for you today and I will be in touch with your results asap We are going to treat the area on your nose for both viral and bacterial infections with the mupirocin ointment, keflex antibiotic and valtrex pill If your nose is not significantly better in 24- 36 hours please let me know

## 2016-05-01 NOTE — Progress Notes (Signed)
[facility] [address] [place], [place] [address] [phone]  Date:  05/01/2016   Name:  Stephanie Parks   DOB:  13-Oct-1945   MRN:  [id number]  PCP:  Lamar Blinks, MD    Chief Complaint: Blister (c/o blister on nose from runny nose. )   History of Present Illness:  Stephanie Parks is a 71 y.o. very pleasant female patient who presents with the following:  Here today to check on a ?fever blister on the right side of her nose.  It first came on on Saturday-today is Wednesday.  it seemed to start after she has a cold and nasal allergy It is tender to the touch Never had this in the past She has not had a fever   Last seen here last summer- partial HPI from that visit  History of CAD, aneurysm, high cholesterol, HTN Her GI is Dr. Hilarie Fredrickson.   She has seen Dr. Angelena Form with cardiology in the past.  She is on asa and statin, negative treadmill in 2013.  She never had a stent or other intervention  She is fasting today except for egg whites and oatmeal- she is overdue for labs so will do for her today    Patient Active Problem List   Diagnosis Date Noted  . 3-vessel CAD 03/01/2015  . Aneurysm ([place]) 03/01/2015  . Dizziness 03/01/2015  . Hypercholesterolemia 03/01/2015  . BP (high blood pressure) 03/01/2015  . Decreased potassium in the blood 03/01/2015  . Paresthesia of arm 03/01/2015  . Allergic rhinitis due to pollen 03/01/2015  . Achalasia 09/28/2012  . CN (constipation) 09/28/2012  . Cephalalgia 08/24/2012  . HTN (hypertension) 04/19/2011  . Hyperlipidemia 04/19/2011  . GERD (gastroesophageal reflux disease) 04/19/2011  . Chest pain 04/04/2011    Past Medical History:  Diagnosis Date  . GERD (gastroesophageal reflux disease)   . Hiatal hernia   . Hyperlipidemia   . Hypertension   . Migraine headache     Past Surgical History:  Procedure Laterality Date  . ABDOMINAL HYSTERECTOMY    . BACK  SURGERY     x 3  . CARPAL TUNNEL RELEASE     left wrist  . CERVICAL SPINE SURGERY    . HEMORRHOID SURGERY      Social History  Substance Use Topics  . Smoking status: Former Smoker    Packs/day: 0.50    Years: 20.00    Types: Cigarettes    Quit date: 02/19/1984  . Smokeless tobacco: Never Used  . Alcohol use No    Family History  Problem Relation Age of Onset  . Allergic rhinitis Sister   . Heart attack Father 86  . Stomach cancer Maternal Grandmother   . Colon cancer Neg Hx   . Angioedema Neg Hx   . Asthma Neg Hx   . Eczema Neg Hx   . Urticaria Neg Hx   . Immunodeficiency Neg Hx     Allergies  Allergen Reactions  . Shellfish Allergy Hives and Swelling  . Ace Inhibitors Other (See Comments)  . Pitavastatin   . Shellfish-Derived Products Other (See Comments)    Hives Hives  . Sulfamethoxazole Other (See Comments)    Hives  . Sulfa Antibiotics Other (See Comments) and Rash    Fine bumps    Medication list has been reviewed and updated.  Current Outpatient Prescriptions on File Prior to Visit  Medication Sig Dispense Refill  . aspirin  81 MG tablet Take 81 mg by mouth daily.    . fluticasone (FLONASE) 50 MCG/ACT nasal spray USE 2 SPRAYS IN NOSTRIL(S) DAILY AS NEEDED.  3  . HYDROcodone-acetaminophen (NORCO) 5-325 MG tablet Take 1-2 tablets by mouth every 6 (six) hours as needed. 15 tablet 0  . KLOR-CON M20 20 MEQ tablet TAKE 1/2 OR 1 PILL DAILY 30 tablet 1  . losartan-hydrochlorothiazide (HYZAAR) 100-25 MG per tablet Take 1 tablet by mouth daily.      Marland Kitchen lovastatin (MEVACOR) 20 MG tablet Take 20 mg by mouth.    . Multiple Vitamins-Minerals (MULTIVITAL) tablet Take by mouth.    . NONFORMULARY OR COMPOUNDED ITEM Allergen immunotherapy (Patient taking differently: Allergy shots (new vial: shot given weekly for four weeks) Then once every four weeks.) 2 each 1  . nortriptyline (PAMELOR) 25 MG capsule Take 1 capsule (25 mg total) by mouth at bedtime. 90 capsule 3  .  omeprazole (PRILOSEC) 20 MG capsule Take 20 mg by mouth daily.    . [DISCONTINUED] esomeprazole (NEXIUM) 40 MG capsule Take 1 capsule (40 mg total) by mouth daily. 30 capsule 3   No current facility-administered medications on file prior to visit.     Review of Systems:  As per HPI- otherwise negative.   Physical Examination: Vitals:   05/01/16 1244  BP: 138/72  Pulse: 91  Temp: 98.5 F (36.9 C)   Vitals:   05/01/16 1244  Weight: 203 lb 9.6 oz (92.4 kg)  Height: 5\' 6"  (1.676 m)   Body mass index is 32.86 kg/m. Ideal Body Weight: Weight in (lb) to have BMI = 25: 154.6  GEN: WDWN, NAD, Non-toxic, A & O x 3, obese, otherwise looks well except for her nose HEENT: Atraumatic, Normocephalic. Neck supple. No masses, No LAD.  Bilateral TM wnl, oropharynx normal.  PEERL,EOMI.   Ears and Nose: No external deformity. CV: RRR, No M/G/R. No JVD. No thrill. No extra heart sounds. PULM: CTA B, no wheezes, crackles, rhonchi. No retractions. No resp. distress. No accessory muscle use. EXTR: No c/c/e NEURO Normal gait.  PSYCH: Normally interactive. Conversant. Not depressed or anxious appearing.  Calm demeanor.  Right nostril: external right nare shows some honey colored crust and also some vesicles.  Uncertain if this represents HSV or impetigo/ other superficial bacterial infection.  No abscess or fluctuance   Assessment and Plan: Blister of nose with infection, initial encounter - Plan: cephALEXin (KEFLEX) 500 MG capsule, valACYclovir (VALTREX) 1000 MG tablet, mupirocin ointment (BACTROBAN) 2 %  Dyslipidemia - Plan: Lipid panel  Essential hypertension - Plan: Comprehensive metabolic panel  Encounter for hepatitis C screening test for low risk patient - Plan: Hepatitis C antibody  Screening for deficiency anemia - Plan: CBC  Medication monitoring encounter - Plan: CBC, Comprehensive metabolic panel  Screening for diabetes mellitus - Plan: Hemoglobin A1c  Treat viral and bacterial  etiologies of infection as above Other screening labs today as well   We will get labs for you today and I will be in touch with your results asap We are going to treat the area on your nose for both viral and bacterial infections with the mupirocin ointment, keflex antibiotic and valtrex pill If your nose is not significantly better in 24- 36 hours please let me know  Signed Lamar Blinks, MD

## 2016-05-02 ENCOUNTER — Encounter: Payer: Self-pay | Admitting: Family Medicine

## 2016-05-02 LAB — HEPATITIS C ANTIBODY: HCV AB: NEGATIVE

## 2016-05-20 ENCOUNTER — Ambulatory Visit: Payer: Medicare Other | Admitting: Family Medicine

## 2016-06-14 ENCOUNTER — Ambulatory Visit (INDEPENDENT_AMBULATORY_CARE_PROVIDER_SITE_OTHER): Payer: Medicare Other | Admitting: Internal Medicine

## 2016-06-14 ENCOUNTER — Telehealth: Payer: Self-pay | Admitting: Family Medicine

## 2016-06-14 ENCOUNTER — Encounter: Payer: Self-pay | Admitting: Internal Medicine

## 2016-06-14 VITALS — BP 128/60 | HR 82 | Temp 98.2°F | Resp 14 | Ht 66.0 in | Wt 205.0 lb

## 2016-06-14 DIAGNOSIS — R002 Palpitations: Secondary | ICD-10-CM

## 2016-06-14 DIAGNOSIS — R42 Dizziness and giddiness: Secondary | ICD-10-CM | POA: Diagnosis not present

## 2016-06-14 LAB — BASIC METABOLIC PANEL
BUN: 14 mg/dL (ref 6–23)
CALCIUM: 9.4 mg/dL (ref 8.4–10.5)
CO2: 34 meq/L — AB (ref 19–32)
CREATININE: 0.79 mg/dL (ref 0.40–1.20)
Chloride: 100 mEq/L (ref 96–112)
GFR: 92.35 mL/min (ref >60.00)
GLUCOSE: 95 mg/dL (ref 70–99)
Potassium: 3.6 mEq/L (ref 3.5–5.1)
Sodium: 139 mEq/L (ref 135–145)

## 2016-06-14 LAB — CBC WITH DIFFERENTIAL/PLATELET
BASOS ABS: 0 10*3/uL (ref 0.0–0.1)
Basophils Relative: 0.6 % (ref 0.0–3.0)
EOS ABS: 0.4 10*3/uL (ref 0.0–0.7)
Eosinophils Relative: 5.6 % — ABNORMAL HIGH (ref 0.0–5.0)
HCT: 34.2 % — ABNORMAL LOW (ref 36.0–46.0)
Hemoglobin: 11.4 g/dL — ABNORMAL LOW (ref 12.0–15.0)
LYMPHS ABS: 2.6 10*3/uL (ref 0.7–4.0)
Lymphocytes Relative: 35.5 % (ref 12.0–46.0)
MCHC: 33.4 g/dL (ref 30.0–36.0)
MCV: 88.3 fl (ref 78.0–100.0)
Monocytes Absolute: 0.4 10*3/uL (ref 0.1–1.0)
Monocytes Relative: 5.5 % (ref 3.0–12.0)
NEUTROS ABS: 3.8 10*3/uL (ref 1.4–7.7)
NEUTROS PCT: 52.8 % (ref 43.0–77.0)
PLATELETS: 336 10*3/uL (ref 150.0–400.0)
RBC: 3.88 Mil/uL (ref 3.87–5.11)
RDW: 15.2 % (ref 11.5–15.5)
WBC: 7.2 10*3/uL (ref 4.0–10.5)

## 2016-06-14 LAB — TSH: TSH: 2.12 u[IU]/mL (ref 0.35–4.50)

## 2016-06-14 NOTE — Progress Notes (Signed)
Pre visit review using our clinic review tool, if applicable. No additional management support is needed unless otherwise documented below in the visit note. 

## 2016-06-14 NOTE — Progress Notes (Signed)
Subjective:    Patient ID: Stephanie Parks, female    DOB: [birth year], 71 y.o.   MRN: [id number]  DOS:  06/14/2016 Type of visit - description : acute Interval history: Multiple concerns 4 weeks history of palpitations described as a fluttering in the chest, on and off, 2 or 3 episodes a day, they last 20 minutes. Occasionally associated nausea but no chest pain, difficulty breathing. Had 2 episodes when she felt slightly lightheaded. Symptoms are at rest.  Lightheadedness occasionally for the last 2 days, not necessarily associated with fluttering.  2 days ago, ankles were swelling at the end of the day, worse on the left. No swelling during the daytime.  2 days ago, had an episode of proximal left upper extremity pain during the church service, there was no associated symptoms specifically no nausea, chest pain or fluttering. Symptoms resolved after 30 minutes.  She works at [employer], very active there without any problems. (CP-SOB)  Review of Systems   Denies anxiety, not taking any decongestants No recent airplane trip or prolonged car trips. No calf pain or swelling. No orthopnea. Denies any stroke symptoms such as slurred speech, motor deficits, facial numbness. No LOC.  Past Medical History:  Diagnosis Date  . GERD (gastroesophageal reflux disease)   . Hiatal hernia   . Hyperlipidemia   . Hypertension   . Migraine headache     Past Surgical History:  Procedure Laterality Date  . ABDOMINAL HYSTERECTOMY    . BACK SURGERY     x 3  . CARPAL TUNNEL RELEASE     left wrist  . CERVICAL SPINE SURGERY    . HEMORRHOID SURGERY      Social History   Social History  . Marital status: Divorced    Spouse name: N/A  . Number of children: 2  . Years of education: N/A   Occupational History  . [employer]   Social History Main Topics  . Smoking status: Former Smoker    Packs/day: 0.50    Years: 20.00    Types: Cigarettes    Quit date: 02/19/1984  .  Smokeless tobacco: Never Used  . Alcohol use No  . Drug use: No  . Sexual activity: Not on file   Other Topics Concern  . Not on file   Social History Narrative  . No narrative on file      Allergies as of 06/14/2016      Reactions   Shellfish Allergy Hives, Swelling   Ace Inhibitors Other (See Comments)   Pitavastatin    Shellfish-derived Products Other (See Comments)   Hives Hives   Sulfamethoxazole Other (See Comments)   Hives   Sulfa Antibiotics Other (See Comments), Rash   Fine bumps      Medication List       Accurate as of 06/14/16 11:59 PM. Always use your most recent med list.          aspirin 81 MG tablet Take 81 mg by mouth daily.   fluticasone 50 MCG/ACT nasal spray Commonly known as:  FLONASE USE 2 SPRAYS IN NOSTRIL(S) DAILY AS NEEDED.   KLOR-CON M20 20 MEQ tablet Generic drug:  potassium chloride SA TAKE 1/2 OR 1 PILL DAILY   losartan-hydrochlorothiazide 100-25 MG tablet Commonly known as:  HYZAAR Take 1 tablet by mouth daily.   lovastatin 20 MG tablet Commonly known as:  MEVACOR Take 20 mg by mouth.   MULTIVITAL tablet Take by mouth.   NONFORMULARY OR COMPOUNDED  ITEM Allergen immunotherapy   nortriptyline 25 MG capsule Commonly known as:  PAMELOR Take 1 capsule (25 mg total) by mouth at bedtime.   omeprazole 20 MG capsule Commonly known as:  PRILOSEC Take 20 mg by mouth daily.   valACYclovir 1000 MG tablet Commonly known as:  VALTREX Take twice daily for 10 days          Objective:   Physical Exam BP 128/60 (BP Location: Left Arm, Patient Position: Sitting, Cuff Size: Normal)   Pulse 82   Temp 98.2 F (36.8 C) (Oral)   Resp 14   Ht 5\' 6"  (1.676 m)   Wt 205 lb (93 kg)   SpO2 98%   BMI 33.09 kg/m  General:   Well developed, well nourished . NAD.  HEENT:  Normocephalic . Face symmetric, atraumatic. Neck: No thyromegaly Lungs:  CTA B Normal respiratory effort, no intercostal retractions, no accessory muscle  use. Heart: RRR,  no murmur.  no pretibial edema bilaterally , no ankle swelling Abdomen:  Not distended, soft, non-tender. No rebound or rigidity.   Skin: Not pale. Not jaundice Neurologic:  alert & oriented X3.  Speech normal, gait appropriate for age and unassisted Psych--  Cognition and judgment appear intact.  Cooperative with normal attention span and concentration.  Behavior appropriate. No anxious or depressed appearing.     Assessment & Plan:    71 year old lady with history of hyperlipidemia, hypertension, h/o HAs on pamelor x > 10 years per pt, GERD, migraine headaches, mild CAD by cath 2003, 2013 c/o  CP> saw cards, negative treadmill stress . Also saw cardiology 06/2015, they noted a cardiac catheterization 2009 with nonobstructive heart disease, echo showed a normal EF, had left arm weakness at the time, no further evaluation was recommended. Presents with the following:  Palpitations, lightheadedness, left arm pain (single episode). Etiology is unclear, EKG today is at baseline, NSR. Low risk for PE. Previous cardiac evaluations unrevealing. Last LDL 98, last A1c 5.9. Plan: BMP, CBC, TSH, Holter monitor. Refer to cardiology, will ask PCP to see in 2 weeks. ER if severe symptoms. See instructions

## 2016-06-14 NOTE — Patient Instructions (Signed)
GO TO THE LAB : Get the blood work     GO TO THE FRONT DESK Schedule your next appointment for a  checkup with your primary doctor in 2 weeks    Please go to the ER or call if: Severe fluttering, lightheadedness, difficulty breathing, more swelling, chest pain, severe arm pain, difficulty breathing.

## 2016-06-14 NOTE — Telephone Encounter (Signed)
Pt has an appt with Dr. Larose Kells today (06/14/16) at 1:15pm.

## 2016-06-14 NOTE — Telephone Encounter (Signed)
[facility] - Client TELEPHONE ADVICE RECORD [facility] Patient Name: Stephanie Parks DOB: 12/10/1945 Initial Comment Caller states she is having heart fluttering, left arm pain on Wednesday, no arm pain since. No chest pain. Nurse Assessment Nurse: Dimas Chyle, RN, Dellis Filbert Date/Time Eilene Ghazi Time): 06/14/2016 9:27:52 AM Confirm and document reason for call. If symptomatic, describe symptoms. ---Caller states she is having heart fluttering, left arm pain on Wednesday, no arm pain since. No chest pain. Takes Losartan daily. Has had symptoms for a month. Does the patient have any new or worsening symptoms? ---Yes Will a triage be completed? ---Yes Related visit to physician within the last 2 weeks? ---No Does the PT have any chronic conditions? (i.e. diabetes, asthma, etc.) ---Yes List chronic conditions. ---HTN Is this a behavioral health or substance abuse call? ---No Guidelines Guideline Title Affirmed Question Affirmed Notes Heart Rate and Heartbeat Questions New or worsened ankle swelling Final Disposition User See Physician within 4 Hours (or PCP triage) Dimas Chyle, RN, Dellis Filbert Referrals REFERRED TO PCP OFFICE Disagree/Comply: Leta Baptist

## 2016-06-14 NOTE — Telephone Encounter (Signed)
Pt called in wanting to schedule a stress test with PCP. Pt says that she has been experiencing some heart fluttering for about a month and pain in her left arm that goes and comes. Pt says that last time she experienced arm pain was on Wed.   Transferred pt to [facility] for triaging.

## 2016-06-15 ENCOUNTER — Emergency Department (HOSPITAL_BASED_OUTPATIENT_CLINIC_OR_DEPARTMENT_OTHER)
Admission: EM | Admit: 2016-06-15 | Discharge: 2016-06-16 | Disposition: A | Discharge status: Home / Self Care | Payer: Medicare Other | Attending: Emergency Medicine | Admitting: Emergency Medicine

## 2016-06-15 ENCOUNTER — Encounter (HOSPITAL_BASED_OUTPATIENT_CLINIC_OR_DEPARTMENT_OTHER): Payer: Self-pay | Admitting: Emergency Medicine

## 2016-06-15 ENCOUNTER — Emergency Department (HOSPITAL_BASED_OUTPATIENT_CLINIC_OR_DEPARTMENT_OTHER): Payer: Medicare Other

## 2016-06-15 DIAGNOSIS — Z7982 Long term (current) use of aspirin: Secondary | ICD-10-CM | POA: Diagnosis not present

## 2016-06-15 DIAGNOSIS — R51 Headache: Secondary | ICD-10-CM

## 2016-06-15 DIAGNOSIS — Z79899 Other long term (current) drug therapy: Secondary | ICD-10-CM | POA: Insufficient documentation

## 2016-06-15 DIAGNOSIS — I671 Cerebral aneurysm, nonruptured: Secondary | ICD-10-CM

## 2016-06-15 DIAGNOSIS — G43009 Migraine without aura, not intractable, without status migrainosus: Secondary | ICD-10-CM | POA: Insufficient documentation

## 2016-06-15 DIAGNOSIS — Z87891 Personal history of nicotine dependence: Secondary | ICD-10-CM | POA: Diagnosis not present

## 2016-06-15 DIAGNOSIS — Z0389 Encounter for observation for other suspected diseases and conditions ruled out: Secondary | ICD-10-CM | POA: Diagnosis not present

## 2016-06-15 DIAGNOSIS — I10 Essential (primary) hypertension: Secondary | ICD-10-CM | POA: Insufficient documentation

## 2016-06-15 DIAGNOSIS — R519 Headache, unspecified: Secondary | ICD-10-CM

## 2016-06-15 LAB — BASIC METABOLIC PANEL
Anion gap: 6 (ref 5–15)
BUN: 16 mg/dL (ref 6–20)
CHLORIDE: 100 mmol/L — AB (ref 101–111)
CO2: 33 mmol/L — ABNORMAL HIGH (ref 22–32)
CREATININE: 0.86 mg/dL (ref 0.44–1.00)
Calcium: 9.1 mg/dL (ref 8.9–10.3)
GFR calc Af Amer: >60 mL/min (ref >60)
GFR calc non Af Amer: >60 mL/min (ref >60)
Glucose, Bld: 96 mg/dL (ref 65–99)
Potassium: 3.5 mmol/L (ref 3.5–5.1)
Sodium: 139 mmol/L (ref 135–145)

## 2016-06-15 LAB — CBC WITH DIFFERENTIAL/PLATELET
BASOS PCT: 0 %
Basophils Absolute: 0 10*3/uL (ref 0.0–0.1)
Eosinophils Absolute: 0.4 10*3/uL (ref 0.0–0.7)
Eosinophils Relative: 5 %
HEMATOCRIT: 33.9 % — AB (ref 36.0–46.0)
HEMOGLOBIN: 11.2 g/dL — AB (ref 12.0–15.0)
Lymphocytes Relative: 37 %
Lymphs Abs: 3.1 10*3/uL (ref 0.7–4.0)
MCH: 29.6 pg (ref 26.0–34.0)
MCHC: 33 g/dL (ref 30.0–36.0)
MCV: 89.7 fL (ref 78.0–100.0)
MONOS PCT: 7 %
Monocytes Absolute: 0.6 10*3/uL (ref 0.1–1.0)
NEUTROS ABS: 4.3 10*3/uL (ref 1.7–7.7)
NEUTROS PCT: 51 %
Platelets: 316 10*3/uL (ref 150–400)
RBC: 3.78 MIL/uL — AB (ref 3.87–5.11)
RDW: 14.9 % (ref 11.5–15.5)
WBC: 8.4 10*3/uL (ref 4.0–10.5)

## 2016-06-15 LAB — TROPONIN I: Troponin I: <0.03 ng/mL (ref <0.03)

## 2016-06-15 MED ORDER — IOPAMIDOL (ISOVUE-370) INJECTION 76%
100.0000 mL | Freq: Once | INTRAVENOUS | Status: AC | PRN
  Administered 2016-06-15: 100 mL via INTRAVENOUS

## 2016-06-15 MED ORDER — METOCLOPRAMIDE HCL 5 MG/ML IJ SOLN
10.0000 mg | Freq: Once | INTRAMUSCULAR | Status: AC
  Administered 2016-06-15: 10 mg via INTRAVENOUS
  Filled 2016-06-15: qty 2

## 2016-06-15 MED ORDER — DIPHENHYDRAMINE HCL 50 MG/ML IJ SOLN
12.5000 mg | Freq: Once | INTRAMUSCULAR | Status: AC
  Administered 2016-06-15: 12.5 mg via INTRAVENOUS
  Filled 2016-06-15: qty 1

## 2016-06-15 MED ORDER — DEXAMETHASONE SODIUM PHOSPHATE 10 MG/ML IJ SOLN
10.0000 mg | Freq: Once | INTRAMUSCULAR | Status: AC
  Administered 2016-06-15: 10 mg via INTRAVENOUS
  Filled 2016-06-15: qty 1

## 2016-06-15 NOTE — ED Triage Notes (Signed)
PT presents to ED with c/o headache and bilateral eye pain that started today at 5 pm.  PT states she had aneurysm 10 years ago and her headache feels the same.

## 2016-06-15 NOTE — ED Provider Notes (Signed)
[place] [facility] Provider Note   CSN: [id number] Arrival date & time: 06/15/16  2015  By signing my name below, I, Dora Sims, attest that this documentation has been prepared under the direction and in the presence of physician practitioner, Duffy Bruce, MD. Electronically Signed: Dora Sims, Scribe. 06/15/2016. 9:04 PM.  History   Chief Complaint Chief Complaint  Patient presents with  . Headache    The history is provided by the patient. No language interpreter was used.    HPI Comments: SOUA LENK is a 71 y.o. female with PMHx including migraines, HTN, HLD, and cerebral aneurysm who presents to the Emergency Department complaining of a gradually worsening, bilateral temporal and frontal headache beginning around 5 PM this evening while at rest. She currently rates her headache at 8/10 in severity. Patient reports some associated nausea without vomiting, photophobia, and intermittent blurry vision. She notes she has had some intermittent lightheadedness, palpitations, and left shoulder pain over the past few days; she visited her PCP for these symptoms yesterday and had some blood work performed. Patient has also had some recent chills, rhinorrhea, and nasal congestion. She states her current headache is less severe and in a different location than the headaches she experienced with her cerebral aneurysm. Pt states her cerebral aneurysm was 10 years ago and resolved without procedural intervention. She has been sleeping at her baseline. Pt denies abdominal pain or any other associated symptoms.  Past Medical History:  Diagnosis Date  . GERD (gastroesophageal reflux disease)   . Hiatal hernia   . Hyperlipidemia   . Hypertension   . Migraine headache     Patient Active Problem List   Diagnosis Date Noted  . 3-vessel CAD 03/01/2015  . Aneurysm ([place]) 03/01/2015  . Dizziness 03/01/2015  . Hypercholesterolemia 03/01/2015  . BP (high blood pressure) 03/01/2015    . Decreased potassium in the blood 03/01/2015  . Paresthesia of arm 03/01/2015  . Allergic rhinitis due to pollen 03/01/2015  . Achalasia 09/28/2012  . CN (constipation) 09/28/2012  . Cephalalgia 08/24/2012  . HTN (hypertension) 04/19/2011  . Hyperlipidemia 04/19/2011  . GERD (gastroesophageal reflux disease) 04/19/2011  . Chest pain 04/04/2011    Past Surgical History:  Procedure Laterality Date  . ABDOMINAL HYSTERECTOMY    . BACK SURGERY     x 3  . CARPAL TUNNEL RELEASE     left wrist  . CERVICAL SPINE SURGERY    . HEMORRHOID SURGERY      OB History    No data available       Home Medications    Prior to Admission medications   Medication Sig Start Date End Date Taking? Authorizing Provider  aspirin 81 MG tablet Take 81 mg by mouth daily.    Historical Provider, MD  fluticasone (FLONASE) 50 MCG/ACT nasal spray USE 2 SPRAYS IN NOSTRIL(S) DAILY AS NEEDED. 02/21/15   Historical Provider, MD  KLOR-CON M20 20 MEQ tablet TAKE 1/2 OR 1 PILL DAILY 03/04/16   Gay Filler Copland, MD  losartan-hydrochlorothiazide (HYZAAR) 100-25 MG per tablet Take 1 tablet by mouth daily.      Historical Provider, MD  lovastatin (MEVACOR) 20 MG tablet Take 20 mg by mouth.    Historical Provider, MD  Multiple Vitamins-Minerals (MULTIVITAL) tablet Take by mouth.    Historical Provider, MD  NONFORMULARY OR COMPOUNDED ITEM Allergen immunotherapy Patient taking differently: Allergy shots (new vial: shot given weekly for four weeks) Then once every four weeks. 03/01/15   Leda Roys, MD  nortriptyline (PAMELOR) 25 MG capsule Take 1 capsule (25 mg total) by mouth at bedtime. 04/08/16   Gay Filler Copland, MD  omeprazole (PRILOSEC) 20 MG capsule Take 20 mg by mouth daily.    Historical Provider, MD  valACYclovir (VALTREX) 1000 MG tablet Take twice daily for 10 days 05/01/16   [name], MD    Family History Family History  Problem Relation Age of Onset  . Allergic rhinitis Sister   . Heart attack  Father 20  . Stomach cancer Maternal Grandmother   . Colon cancer Neg Hx   . Angioedema Neg Hx   . Asthma Neg Hx   . Eczema Neg Hx   . Urticaria Neg Hx   . Immunodeficiency Neg Hx     Social History Social History  Substance Use Topics  . Smoking status: Former Smoker    Packs/day: 0.50    Years: 20.00    Types: Cigarettes    Quit date: 02/19/1984  . Smokeless tobacco: Never Used  . Alcohol use No     Allergies   Shellfish allergy; Ace inhibitors; Pitavastatin; Shellfish-derived products; Sulfamethoxazole; and Sulfa antibiotics   Review of Systems Review of Systems  Constitutional: Positive for chills. Negative for fatigue and fever.  HENT: Positive for congestion and rhinorrhea.   Eyes: Positive for photophobia and visual disturbance.  Respiratory: Negative for cough, shortness of breath and wheezing.   Cardiovascular: Positive for palpitations. Negative for chest pain and leg swelling.  Gastrointestinal: Positive for nausea. Negative for abdominal pain, diarrhea and vomiting.  Genitourinary: Negative for dysuria and flank pain.  Musculoskeletal: Positive for myalgias. Negative for neck pain and neck stiffness.  Skin: Negative for rash and wound.  Allergic/Immunologic: Negative for immunocompromised state.  Neurological: Positive for light-headedness and headaches. Negative for syncope and weakness.  All other systems reviewed and are negative.  Physical Exam Updated Vital Signs BP 132/89   Pulse 73   Temp 98.1 F (36.7 C) (Oral)   Resp 11   Ht 5\' 6"  (1.676 m)   Wt 205 lb (93 kg)   SpO2 99%   BMI 33.09 kg/m   Physical Exam  Constitutional: She is oriented to person, place, and time. She appears well-developed and well-nourished. No distress.  HENT:  Head: Normocephalic and atraumatic.  Mouth/Throat: Oropharynx is clear and moist.  Eyes: Conjunctivae are normal.  Neck: Neck supple.  Cardiovascular: Normal rate, regular rhythm and normal heart sounds.  Exam  reveals no friction rub.   No murmur heard. Pulmonary/Chest: Effort normal and breath sounds normal. No respiratory distress. She has no wheezes. She has no rales.  Abdominal: She exhibits no distension.  Musculoskeletal: She exhibits no edema.  Neurological: She is alert and oriented to person, place, and time. She exhibits normal muscle tone.  Skin: Skin is warm. Capillary refill takes less than 2 seconds.  Psychiatric: She has a normal mood and affect.  Nursing note and vitals reviewed.  Neurological Exam:  Mental Status: Alert and oriented to person, place, and time. Attention and concentration normal. Speech clear. Recent memory is intact. Cranial Nerves: Visual fields grossly intact. EOMI and PERRLA. No nystagmus noted. Facial sensation intact at forehead, maxillary cheek, and chin/mandible bilaterally. No facial asymmetry or weakness. Hearing grossly normal. Uvula is midline, and palate elevates symmetrically. Normal SCM and trapezius strength. Tongue midline without fasciculations. Motor: Muscle strength 5/5 in proximal and distal UE and LE bilaterally. No pronator drift. Muscle tone normal. Reflexes: 2+ and symmetrical in all four  extremities.  Sensation: Intact to light touch in upper and lower extremities distally bilaterally.  Gait: Normal without ataxia. Coordination: Normal FTN bilaterally.   ED Treatments / Results  Labs (all labs ordered are listed, but only abnormal results are displayed) Labs Reviewed  CBC WITH DIFFERENTIAL/PLATELET - Abnormal; Notable for the following:       Result Value   RBC 3.78 (*)    Hemoglobin 11.2 (*)    HCT 33.9 (*)    All other components within normal limits  BASIC METABOLIC PANEL - Abnormal; Notable for the following:    Chloride 100 (*)    CO2 33 (*)    All other components within normal limits  TROPONIN I    EKG  EKG Interpretation  Date/Time:  Saturday June 15 2016 22:06:12 EDT Ventricular Rate:  72 PR Interval:    QRS  Duration: 111 QT Interval:  404 QTC Calculation: 443 R Axis:   30 Text Interpretation:  Sinus rhythm Low voltage, precordial leads No significant change since last tracing Confirmed by Avri Paiva MD, Lysbeth Galas [phone]) on 06/15/2016 10:56:59 PM       Radiology Ct Angio Head W Or Wo Contrast  Result Date: 06/15/2016 CLINICAL DATA:  Headache and bilateral eye pain beginning at 5 p.m. today. History of aneurysm 10 years ago with similar symptoms. History of hypertension, migraine and hyperlipidemia. EXAM: CT ANGIOGRAPHY HEAD AND NECK TECHNIQUE: Multidetector CT imaging of the head and neck was performed using the standard protocol during bolus administration of intravenous contrast. Multiplanar CT image reconstructions and MIPs were obtained to evaluate the vascular anatomy. Carotid stenosis measurements (when applicable) are obtained utilizing NASCET criteria, using the distal internal carotid diameter as the denominator. CONTRAST:  100 cc Isovue 370 COMPARISON:  CT HEAD June 02, 2006 and MRA head January 10, 2007 FINDINGS: CT HEAD FINDINGS BRAIN: No intraparenchymal hemorrhage, mass effect nor midline shift. The ventricles and sulci are normal for age. Patchy supratentorial white matter hypodensities less than expected for patient's age, though non-specific are most compatible with chronic small vessel ischemic disease. No acute large vascular territory infarcts. No abnormal extra-axial fluid collections. Basal cisterns are patent. VASCULAR: Trace calcific atherosclerosis of the carotid siphons. SKULL: No skull fracture. No significant scalp soft tissue swelling. SINUSES/ORBITS: The mastoid air-cells and included paranasal sinuses are well-aerated.The included ocular globes and orbital contents are non-suspicious. OTHER: None. CTA NECK AORTIC ARCH: Normal appearance of the thoracic arch, 2 vessel arch is a normal variant. Mild calcific atherosclerosis. The origins of the innominate, left Common carotid artery  and subclavian artery are widely patent. RIGHT subclavian artery origin of old. Tortuous innominate artery. In RIGHT CAROTID SYSTEM: Common carotid artery is widely patent. Normal appearance of the carotid bifurcation without hemodynamically significant stenosis by NASCET criteria. Normal appearance of the included internal carotid artery. LEFT CAROTID SYSTEM: Common carotid artery is widely patent. Normal appearance of the carotid bifurcation without hemodynamically significant stenosis by NASCET criteria. Normal appearance of the included internal carotid artery. VERTEBRAL ARTERIES:Left vertebral artery is dominant. Normal appearance of the vertebral arteries, which appear widely patent. SKELETON: No acute osseous process though bone windows have not been submitted. Multiple absent teeth. Status post C4 through C6 ACDF with arthrodesis. Severe C3-4 adjacent segment disease resulting and severe canal stenosis. OTHER NECK: Soft tissues of the neck are non-acute though, not tailored for evaluation. Partially imaged calcified RIGHT hilar lymph nodes. CTA HEAD ANTERIOR CIRCULATION: Patent cervical internal carotid arteries, petrous, cavernous and supra clinoid internal carotid  arteries. Stable size and appearance of 4 x 5 mm patent laterally directed RIGHT para ophthalmic aneurysm, contained within the RIGHT anterior clinoid. Widely patent anterior communicating artery. Patent anterior and middle cerebral arteries with minimal luminal irregularity seen with can represent atherosclerosis or, artifact. No large vessel occlusion, hemodynamically significant stenosis, dissection,, contrast extravasation. POSTERIOR CIRCULATION: Patent vertebral arteries, vertebrobasilar junction and basilar artery, as well as main branch vessels. Patent posterior cerebral arteries. Small RIGHT posterior communicating artery present with minimal luminal irregularity seen with can represent atherosclerosis or, artifact. No large vessel  occlusion, hemodynamically significant stenosis, dissection, contrast extravasation or aneurysm. VENOUS SINUSES: Major dural venous sinuses are patent though not tailored for evaluation on this angiographic examination. ANATOMIC VARIANTS: None. DELAYED PHASE: No abnormal intracranial enhancement. MIP images reviewed. IMPRESSION: CT HEAD:  Negative CT HEAD for age. CTA NECK: No hemodynamically significant stenosis or acute vascular process. C4 through C5 ACDF in arthrodesis with severe C3-4 adjacent segment disease resulting in severe canal stenosis. CTA HEAD: Stable 4 x 5 mm RIGHT para ophthalmic aneurysm. No emergent large vessel occlusion or severe stenosis. Electronically Signed   By: Elon Alas M.D.   On: 06/15/2016 22:58   Ct Angio Neck W Or Wo Contrast  Result Date: 06/15/2016 CLINICAL DATA:  Headache and bilateral eye pain beginning at 5 p.m. today. History of aneurysm 10 years ago with similar symptoms. History of hypertension, migraine and hyperlipidemia. EXAM: CT ANGIOGRAPHY HEAD AND NECK TECHNIQUE: Multidetector CT imaging of the head and neck was performed using the standard protocol during bolus administration of intravenous contrast. Multiplanar CT image reconstructions and MIPs were obtained to evaluate the vascular anatomy. Carotid stenosis measurements (when applicable) are obtained utilizing NASCET criteria, using the distal internal carotid diameter as the denominator. CONTRAST:  100 cc Isovue 370 COMPARISON:  CT HEAD June 02, 2006 and MRA head January 10, 2007 FINDINGS: CT HEAD FINDINGS BRAIN: No intraparenchymal hemorrhage, mass effect nor midline shift. The ventricles and sulci are normal for age. Patchy supratentorial white matter hypodensities less than expected for patient's age, though non-specific are most compatible with chronic small vessel ischemic disease. No acute large vascular territory infarcts. No abnormal extra-axial fluid collections. Basal cisterns are patent.  VASCULAR: Trace calcific atherosclerosis of the carotid siphons. SKULL: No skull fracture. No significant scalp soft tissue swelling. SINUSES/ORBITS: The mastoid air-cells and included paranasal sinuses are well-aerated.The included ocular globes and orbital contents are non-suspicious. OTHER: None. CTA NECK AORTIC ARCH: Normal appearance of the thoracic arch, 2 vessel arch is a normal variant. Mild calcific atherosclerosis. The origins of the innominate, left Common carotid artery and subclavian artery are widely patent. RIGHT subclavian artery origin of old. Tortuous innominate artery. In RIGHT CAROTID SYSTEM: Common carotid artery is widely patent. Normal appearance of the carotid bifurcation without hemodynamically significant stenosis by NASCET criteria. Normal appearance of the included internal carotid artery. LEFT CAROTID SYSTEM: Common carotid artery is widely patent. Normal appearance of the carotid bifurcation without hemodynamically significant stenosis by NASCET criteria. Normal appearance of the included internal carotid artery. VERTEBRAL ARTERIES:Left vertebral artery is dominant. Normal appearance of the vertebral arteries, which appear widely patent. SKELETON: No acute osseous process though bone windows have not been submitted. Multiple absent teeth. Status post C4 through C6 ACDF with arthrodesis. Severe C3-4 adjacent segment disease resulting and severe canal stenosis. OTHER NECK: Soft tissues of the neck are non-acute though, not tailored for evaluation. Partially imaged calcified RIGHT hilar lymph nodes. CTA HEAD ANTERIOR CIRCULATION: Patent cervical  internal carotid arteries, petrous, cavernous and supra clinoid internal carotid arteries. Stable size and appearance of 4 x 5 mm patent laterally directed RIGHT para ophthalmic aneurysm, contained within the RIGHT anterior clinoid. Widely patent anterior communicating artery. Patent anterior and middle cerebral arteries with minimal luminal  irregularity seen with can represent atherosclerosis or, artifact. No large vessel occlusion, hemodynamically significant stenosis, dissection,, contrast extravasation. POSTERIOR CIRCULATION: Patent vertebral arteries, vertebrobasilar junction and basilar artery, as well as main branch vessels. Patent posterior cerebral arteries. Small RIGHT posterior communicating artery present with minimal luminal irregularity seen with can represent atherosclerosis or, artifact. No large vessel occlusion, hemodynamically significant stenosis, dissection, contrast extravasation or aneurysm. VENOUS SINUSES: Major dural venous sinuses are patent though not tailored for evaluation on this angiographic examination. ANATOMIC VARIANTS: None. DELAYED PHASE: No abnormal intracranial enhancement. MIP images reviewed. IMPRESSION: CT HEAD:  Negative CT HEAD for age. CTA NECK: No hemodynamically significant stenosis or acute vascular process. C4 through C5 ACDF in arthrodesis with severe C3-4 adjacent segment disease resulting in severe canal stenosis. CTA HEAD: Stable 4 x 5 mm RIGHT para ophthalmic aneurysm. No emergent large vessel occlusion or severe stenosis. Electronically Signed   By: Elon Alas M.D.   On: 06/15/2016 22:58    Procedures Procedures (including critical care time)  DIAGNOSTIC STUDIES: Oxygen Saturation is 99% on RA, normal by my interpretation.    COORDINATION OF CARE: 9:10 PM Discussed treatment plan with pt at bedside and pt agreed to plan.  Medications Ordered in ED Medications  metoCLOPramide (REGLAN) injection 10 mg (10 mg Intravenous Given 06/15/16 2142)  diphenhydrAMINE (BENADRYL) injection 12.5 mg (12.5 mg Intravenous Given 06/15/16 2142)  iopamidol (ISOVUE-370) 76 % injection 100 mL (100 mLs Intravenous Contrast Given 06/15/16 2209)  dexamethasone (DECADRON) injection 10 mg (10 mg Intravenous Given 06/15/16 2324)     Initial Impression / Assessment and Plan / ED Course  I have reviewed  the triage vital signs and the nursing notes.  Pertinent labs & imaging results that were available during my care of the patient were reviewed by me and considered in my medical decision making (see chart for details).    71 yo F with PMHx as above here with gradual onset severe frontal HA, with h/o HA but also h/o cerebral aneurysm. I suspect this is 2/2 acute on chronic migraine/primary HA syndrome but given her h/o aneurysm, CT Angio ordered. HA is not thunderclap, however, and is less c/w SAH. No fevers or signs of meningitis or encephalitis. Will give migraine cocktail. Pt o/w has multiple chronic complaints and does admit to increased stress - screening labs ordered.  CT Angio unchanged from prior and o/w negative. Reviewed prior NSG notes - pt has primary headache syndrome followed by Neuro and no intervention indicated per NSG. Her HA is resolved with migraine meds here. Given unchanged aneurysm, history that is not c/w SAH and resolution with migraine meds here, will d/c with continued outpt follow-up. Labs are o/w reassuring and near baseline.  Final Clinical Impressions(s) / ED Diagnoses   Final diagnoses:  Headache  Migraine without aura and without status migrainosus, not intractable  Brain aneurysm    New Prescriptions Discharge Medication List as of 06/15/2016 11:15 PM     I personally performed the services described in this documentation, which was scribed in my presence. The recorded information has been reviewed and is accurate.    Duffy Bruce, MD 06/16/16 [phone]

## 2016-06-15 NOTE — Discharge Instructions (Signed)
-   Follow-up with your Neurosurgeon as previously scheduled for your aneurysm; you should also call and discuss your headache with your Neurologist/Headache Physician - Your labs showed mild dehydration. Drink at least 6-8 glasses of water daily for the next several days. This can help with your headaches.

## 2016-06-18 ENCOUNTER — Ambulatory Visit (INDEPENDENT_AMBULATORY_CARE_PROVIDER_SITE_OTHER): Payer: Medicare Other

## 2016-06-18 DIAGNOSIS — R002 Palpitations: Secondary | ICD-10-CM

## 2016-06-20 ENCOUNTER — Ambulatory Visit (INDEPENDENT_AMBULATORY_CARE_PROVIDER_SITE_OTHER): Payer: Medicare Other | Admitting: Family Medicine

## 2016-06-20 ENCOUNTER — Encounter: Payer: Self-pay | Admitting: Family Medicine

## 2016-06-20 VITALS — BP 118/62 | HR 81 | Temp 98.5°F | Ht 66.0 in | Wt 205.4 lb

## 2016-06-20 DIAGNOSIS — Z130 Encounter for screening for diseases of the blood and blood-forming organs and certain disorders involving the immune mechanism: Secondary | ICD-10-CM

## 2016-06-20 DIAGNOSIS — G4459 Other complicated headache syndrome: Secondary | ICD-10-CM

## 2016-06-20 DIAGNOSIS — I10 Essential (primary) hypertension: Secondary | ICD-10-CM

## 2016-06-20 NOTE — Progress Notes (Signed)
[place] at [facility] [address] [place], [place] [address] [phone]  Date:  06/20/2016   Name:  Stephanie Parks   DOB:  1945-08-14   MRN:  [id number]  PCP:  Lamar Blinks, MD    Chief Complaint: Hospitalization Follow-up   History of Present Illness:  Stephanie Parks is a 71 y.o. very pleasant female patient who presents with the following:  Here today for a hospital follow-up She was seen in the ER on 4/28 with a headache- was evalauted and released.  She states that she still "feels a little lightheaded, it's like nerves rattling around in my head on the left side."  she has a history of cerebral aneurysm in the past which did not require any surgical intervention.  Her CTA of the head and neck from the ER looked ok  She plans to see Dr. Cyndy Freeze- he has operated on her back twice - to discuss her history of aneurysm. She will call and schedule with him She also notes some numbness in her left thigh over the last couple of weeks- this will come and go, feels "like a numbness, burning sensation."  She has a history of back problems so this is not that unusual for her  The HA is now resolved She took a tylenol yesterday but otherwise has not taken any medications for her HA  BP Readings from Last 3 Encounters:  06/20/16 118/62  06/15/16 132/89  06/14/16 128/60   She is wearing a holter monitor right now due to palpitations- she is seeing cardiology next week for evaluation  She does have a history of CAD but this is managed medically   No difficulty with bowel or bladder control  She did a colonoscopy about 5 years ago, and has a cologuard kit at home that she will complete - noted mild anemia as of late  Patient Active Problem List   Diagnosis Date Noted  . 3-vessel CAD 03/01/2015  . Aneurysm ([place]) 03/01/2015  . Dizziness 03/01/2015  . Hypercholesterolemia 03/01/2015  . BP (high blood pressure) 03/01/2015  .  Decreased potassium in the blood 03/01/2015  . Paresthesia of arm 03/01/2015  . Allergic rhinitis due to pollen 03/01/2015  . Achalasia 09/28/2012  . CN (constipation) 09/28/2012  . Cephalalgia 08/24/2012  . HTN (hypertension) 04/19/2011  . Hyperlipidemia 04/19/2011  . GERD (gastroesophageal reflux disease) 04/19/2011  . Chest pain 04/04/2011    Past Medical History:  Diagnosis Date  . GERD (gastroesophageal reflux disease)   . Hiatal hernia   . Hyperlipidemia   . Hypertension   . Migraine headache     Past Surgical History:  Procedure Laterality Date  . ABDOMINAL HYSTERECTOMY    . BACK SURGERY     x 3  . CARPAL TUNNEL RELEASE     left wrist  . CERVICAL SPINE SURGERY    . HEMORRHOID SURGERY      Social History  Substance Use Topics  . Smoking status: Former Smoker    Packs/day: 0.50    Years: 20.00    Types: Cigarettes    Quit date: 02/19/1984  . Smokeless tobacco: Never Used  . Alcohol use No    Family History  Problem Relation Age of Onset  . Allergic rhinitis Sister   . Heart attack Father 26  . Stomach cancer Maternal Grandmother   . Colon cancer Neg Hx   . Angioedema Neg Hx   . Asthma Neg  Hx   . Eczema Neg Hx   . Urticaria Neg Hx   . Immunodeficiency Neg Hx     Allergies  Allergen Reactions  . Shellfish Allergy Hives and Swelling  . Ace Inhibitors Other (See Comments)  . Pitavastatin   . Shellfish-Derived Products Other (See Comments)    Hives Hives  . Sulfamethoxazole Other (See Comments)    Hives  . Sulfa Antibiotics Other (See Comments) and Rash    Fine bumps    Medication list has been reviewed and updated.  Current Outpatient Prescriptions on File Prior to Visit  Medication Sig Dispense Refill  . aspirin 81 MG tablet Take 81 mg by mouth daily.    . fluticasone (FLONASE) 50 MCG/ACT nasal spray USE 2 SPRAYS IN NOSTRIL(S) DAILY AS NEEDED.  3  . KLOR-CON M20 20 MEQ tablet TAKE 1/2 OR 1 PILL DAILY 30 tablet 1  .  losartan-hydrochlorothiazide (HYZAAR) 100-25 MG per tablet Take 1 tablet by mouth daily.      Marland Kitchen lovastatin (MEVACOR) 20 MG tablet Take 20 mg by mouth.    . Multiple Vitamins-Minerals (MULTIVITAL) tablet Take by mouth.    . NONFORMULARY OR COMPOUNDED ITEM Allergen immunotherapy (Patient taking differently: Allergy shots (new vial: shot given weekly for four weeks) Then once every four weeks.) 2 each 1  . nortriptyline (PAMELOR) 25 MG capsule Take 1 capsule (25 mg total) by mouth at bedtime. 90 capsule 3  . omeprazole (PRILOSEC) 20 MG capsule Take 20 mg by mouth daily.    . valACYclovir (VALTREX) 1000 MG tablet Take twice daily for 10 days 20 tablet 0  . [DISCONTINUED] esomeprazole (NEXIUM) 40 MG capsule Take 1 capsule (40 mg total) by mouth daily. 30 capsule 3   No current facility-administered medications on file prior to visit.     Review of Systems:  As per HPI- otherwise negative.   Physical Examination: Vitals:   06/20/16 1409  BP: 118/62  Pulse: 81  Temp: 98.5 F (36.9 C)   Vitals:   06/20/16 1409  Weight: 205 lb 6.4 oz (93.2 kg)  Height: 5' 6"  (1.676 m)   Body mass index is 33.15 kg/m. Ideal Body Weight: Weight in (lb) to have BMI = 25: 154.6  GEN: WDWN, NAD, Non-toxic, A & O x 3, overweight, otherwise looks well HEENT: Atraumatic, Normocephalic. Neck supple. No masses, No LAD.  Bilateral TM wnl, oropharynx normal.  PEERL,EOMI.   Ears and Nose: No external deformity. CV: RRR, No M/G/R. No JVD. No thrill. No extra heart sounds. PULM: CTA B, no wheezes, crackles, rhonchi. No retractions. No resp. distress. No accessory muscle use. ABD: S, NT, ND, +BS. No rebound. No HSM. EXTR: No c/c/e NEURO Normal gait. Normal strength, sensation and DTR of all extremities,  Normal facial movement, normal romberg testing  PSYCH: Normally interactive. Conversant. Not depressed or anxious appearing.  Calm demeanor.    Assessment and Plan: Essential hypertension  Screening for  deficiency anemia  Other complicated headache syndrome  Here today to follow-up from recent ER visit for concerning headache.  Her evaluation was negative and she plans to see her NSG soon for follow-up Her BP is well controlled today- continue hyzaar.  Recent BMP ok explained that anemia can be a sign of colon cancer and encouraged her to complete her cologuard kit soon  Signed Lamar Blinks, MD

## 2016-06-20 NOTE — Patient Instructions (Signed)
It was good to see you today- let me know if you have any other concerns!  Otherwise let's plan to see each other in about 6 months Please do call Dr. Cyndy Freeze for a follow-up visit

## 2016-06-24 ENCOUNTER — Ambulatory Visit (INDEPENDENT_AMBULATORY_CARE_PROVIDER_SITE_OTHER): Payer: Medicare Other | Admitting: Interventional Cardiology

## 2016-06-24 ENCOUNTER — Encounter: Payer: Self-pay | Admitting: Interventional Cardiology

## 2016-06-24 VITALS — BP 104/50 | HR 95 | Ht 66.0 in | Wt 206.8 lb

## 2016-06-24 DIAGNOSIS — R002 Palpitations: Secondary | ICD-10-CM | POA: Diagnosis not present

## 2016-06-24 DIAGNOSIS — R0600 Dyspnea, unspecified: Secondary | ICD-10-CM | POA: Diagnosis not present

## 2016-06-24 DIAGNOSIS — R0989 Other specified symptoms and signs involving the circulatory and respiratory systems: Secondary | ICD-10-CM | POA: Diagnosis not present

## 2016-06-24 NOTE — Patient Instructions (Signed)
Medication Instructions:  None  Labwork: None  Testing/Procedures: Your physician has requested that you have a carotid duplex. This test is an ultrasound of the carotid arteries in your neck. It looks at blood flow through these arteries that supply the brain with blood. Allow one hour for this exam. There are no restrictions or special instructions.   Follow-Up: Your physician recommends that you schedule a follow-up appointment after carotid doppler is completed.    Any Other Special Instructions Will Be Listed Below (If Applicable).     If you need a refill on your cardiac medications before your next appointment, please call your pharmacy.

## 2016-06-24 NOTE — Progress Notes (Signed)
Cardiology Office Note   Date:  06/24/2016   ID:  Stephanie, Parks 1945/10/29, MRN [id number]  PCP:  [name], MD    No chief complaint on file. palpitations   Wt Readings from Last 3 Encounters:  06/24/16 206 lb 12.8 oz (93.8 kg)  06/20/16 205 lb 6.4 oz (93.2 kg)  06/15/16 205 lb (93 kg)       History of Present Illness: Stephanie Parks is a 71 y.o. female  Who has had HTN and hyperlipidemia.  SHe felt intermittent lightheadedness, typically in the morning.  Did not occur everyday.  No falling or passing out.  It can happen at anytime.  It has happened in the absence of a change in position.    Palpitations have happened several days a week.  Episodes last 20 minutes at a time.  She has to stop doing what she is doing .  SHe has not checked her pulse.  She has had some shortness of breath as well. Worse with walking.  Worse with walking faster.   She has a h/o brain aneurysm.  She was in the ER related to concerns over her aneurysm about 10 days ago.  Cardiac w/u was negative.     Past Medical History:  Diagnosis Date  . GERD (gastroesophageal reflux disease)   . Hiatal hernia   . Hyperlipidemia   . Hypertension   . Migraine headache     Past Surgical History:  Procedure Laterality Date  . ABDOMINAL HYSTERECTOMY    . BACK SURGERY     x 3  . CARPAL TUNNEL RELEASE     left wrist  . CERVICAL SPINE SURGERY    . HEMORRHOID SURGERY       Current Outpatient Prescriptions  Medication Sig Dispense Refill  . aspirin 81 MG tablet Take 81 mg by mouth daily.    . fluticasone (FLONASE) 50 MCG/ACT nasal spray USE 2 SPRAYS IN NOSTRIL(S) DAILY AS NEEDED.  3  . KLOR-CON M20 20 MEQ tablet TAKE 1/2 OR 1 PILL DAILY 30 tablet 1  . losartan-hydrochlorothiazide (HYZAAR) 100-25 MG per tablet Take 1 tablet by mouth daily.      Marland Kitchen lovastatin (MEVACOR) 20 MG tablet Take 20 mg by mouth.    . Multiple Vitamins-Minerals (MULTIVITAL) tablet Take by mouth.    .  NONFORMULARY OR COMPOUNDED ITEM Allergen immunotherapy (Patient taking differently: Allergy shots (new vial: shot given weekly for four weeks) Then once every four weeks.) 2 each 1  . nortriptyline (PAMELOR) 25 MG capsule Take 1 capsule (25 mg total) by mouth at bedtime. 90 capsule 3  . omeprazole (PRILOSEC) 20 MG capsule Take 20 mg by mouth daily.     No current facility-administered medications for this visit.     Allergies:   Shellfish allergy; Ace inhibitors; Pitavastatin; Shellfish-derived products; Sulfamethoxazole; and Sulfa antibiotics    Social History:  The patient  reports that she quit smoking about 32 years ago. Her smoking use included Cigarettes. She has a 10.00 pack-year smoking history. She has never used smokeless tobacco. She reports that she does not drink alcohol or use drugs.   Family History:  The patient's family history includes Allergic rhinitis in her sister; Heart attack (age of onset: 76) in her father; Stomach cancer in her maternal grandmother.    ROS:  Please see the history of present illness.   Otherwise, review of systems are positive for palpitations.   All other systems are reviewed and  negative.    PHYSICAL EXAM: VS:  BP (!) 104/50   Pulse 95   Ht 5\' 6"  (1.676 m)   Wt 206 lb 12.8 oz (93.8 kg)   SpO2 97%   BMI 33.38 kg/m  , BMI Body mass index is 33.38 kg/m. GEN: Well nourished, well developed, in no acute distress  HEENT: normal  Neck: no JVD, ; mild right carotid bruit; no left carotid bruit, or masses; prominent pulsation of the right carotid artery at the area of the clavicle Cardiac: RRR; no murmurs, rubs, or gallops,no edema  Respiratory:  clear to auscultation bilaterally, normal work of breathing GI: soft, nontender, nondistended, + BS MS: no deformity or atrophy  Skin: warm and dry, no rash Neuro:  Strength and sensation are intact Psych: euthymic mood, full affect   EKG:   The ekg ordered in 4/18 in the ER demonstrates NSR, no  significant ST changes   Recent Labs: 08/10/2015: Magnesium 2.0 05/01/2016: ALT 12 06/14/2016: TSH 2.12 06/15/2016: BUN 16; Creatinine, Ser 0.86; Hemoglobin 11.2; Platelets 316; Potassium 3.5; Sodium 139   Lipid Panel    Component Value Date/Time   CHOL 189 05/01/2016 1310   TRIG 149.0 05/01/2016 1310   HDL 60.60 05/01/2016 1310   CHOLHDL 3 05/01/2016 1310   VLDL 29.8 05/01/2016 1310   LDLCALC 98 05/01/2016 1310     Other studies Reviewed: Additional studies/ records that were reviewed today with results demonstrating: ER records reviewed.   ASSESSMENT AND PLAN:  1. Palpitations : Holter monitor result pending.  Could consider longer-term monitor as well if symptoms persist and Holter is unrevealing. 2. Carotid bruit: She has a very prominent right carotid artery just above the clavicle. Given her history of brain aneurysms, I want to rule out any type of aneurysm of the common carotid artery. Will check ultrasound. 3. Shortness of breath: No obvious abnormality on cardiac examination. ECG is normal. Prior echocardiogram from a few years ago was normal.  She admits to not exercising much and gaining weight. May be related to some deconditioning. Await results of other tests before planning any further cardiac testing.   Current medicines are reviewed at length with the patient today.  The patient concerns regarding her medicines were addressed.  The following changes have been made:  No change  Labs/ tests ordered today include: carotid Doppler No orders of the defined types were placed in this encounter.   Recommend 150 minutes/week of aerobic exercise Low fat, low carb, high fiber diet recommended  Disposition:   FU after carotid Doppler   Signed, Larae Grooms, MD  06/24/2016 2:30 PM    [facility] [address], [place], [place]  [address] Phone: [phone]; Fax: [phone]

## 2016-06-27 ENCOUNTER — Ambulatory Visit: Payer: Medicare Other | Admitting: Family Medicine

## 2016-07-04 ENCOUNTER — Telehealth: Payer: Self-pay | Admitting: Interventional Cardiology

## 2016-07-04 ENCOUNTER — Ambulatory Visit (HOSPITAL_COMMUNITY)
Admission: RE | Admit: 2016-07-04 | Discharge: 2016-07-04 | Disposition: A | Discharge status: Home / Self Care | Payer: Medicare Other | Source: Ambulatory Visit | Attending: Cardiology | Admitting: Cardiology

## 2016-07-04 DIAGNOSIS — R0989 Other specified symptoms and signs involving the circulatory and respiratory systems: Secondary | ICD-10-CM | POA: Diagnosis not present

## 2016-07-04 DIAGNOSIS — I6523 Occlusion and stenosis of bilateral carotid arteries: Secondary | ICD-10-CM | POA: Insufficient documentation

## 2016-07-04 DIAGNOSIS — I2510 Atherosclerotic heart disease of native coronary artery without angina pectoris: Secondary | ICD-10-CM | POA: Insufficient documentation

## 2016-07-04 DIAGNOSIS — E785 Hyperlipidemia, unspecified: Secondary | ICD-10-CM | POA: Diagnosis not present

## 2016-07-04 DIAGNOSIS — Z87891 Personal history of nicotine dependence: Secondary | ICD-10-CM | POA: Diagnosis not present

## 2016-07-04 DIAGNOSIS — R42 Dizziness and giddiness: Secondary | ICD-10-CM | POA: Diagnosis not present

## 2016-07-04 DIAGNOSIS — I10 Essential (primary) hypertension: Secondary | ICD-10-CM | POA: Insufficient documentation

## 2016-07-04 NOTE — Telephone Encounter (Signed)
Can you check the last 2 pages of her strips.  HR seems higher than 125 bpm.  THanks.  JV

## 2016-07-11 DIAGNOSIS — I671 Cerebral aneurysm, nonruptured: Secondary | ICD-10-CM | POA: Diagnosis not present

## 2016-07-11 DIAGNOSIS — M4726 Other spondylosis with radiculopathy, lumbar region: Secondary | ICD-10-CM | POA: Diagnosis not present

## 2016-07-17 ENCOUNTER — Ambulatory Visit: Payer: Medicare Other | Admitting: Interventional Cardiology

## 2016-07-17 ENCOUNTER — Telehealth: Payer: Self-pay | Admitting: Interventional Cardiology

## 2016-07-17 NOTE — Telephone Encounter (Signed)
New Message  Pt voiced she would like to speak with nurse and for her to give her a call back.  Please f/u

## 2016-07-17 NOTE — Telephone Encounter (Signed)
Called, spoke with pt. Pt stated she was not trying to reach me. Pt spoke with [place] earlier, who helped her with reschedule her appt. Pt verbalized understanding.

## 2016-08-06 NOTE — Progress Notes (Signed)
   Subjective:   Stephanie Parks is a 71 y.o. female who presents for an Initial Medicare Annual Wellness Visit.  Upon arrival, pt complains of upset stomach, needing to go to bathroom repeatedly for loose stools. Pt states she will call back later to reschedule appt. Pt declines needing appt with provider today.Pt states she only has diarrhea, no other symptoms, and believes it is due to something she ate this morning.

## 2016-08-08 ENCOUNTER — Ambulatory Visit (INDEPENDENT_AMBULATORY_CARE_PROVIDER_SITE_OTHER): Payer: Medicare Other | Admitting: *Deleted

## 2016-08-08 ENCOUNTER — Encounter: Payer: Self-pay | Admitting: *Deleted

## 2016-08-08 ENCOUNTER — Telehealth: Payer: Self-pay | Admitting: Family Medicine

## 2016-08-08 DIAGNOSIS — M21611 Bunion of right foot: Secondary | ICD-10-CM

## 2016-08-08 DIAGNOSIS — M21612 Bunion of left foot: Principal | ICD-10-CM

## 2016-08-08 DIAGNOSIS — R197 Diarrhea, unspecified: Secondary | ICD-10-CM

## 2016-08-08 NOTE — Telephone Encounter (Signed)
Relation to QQ:UIVH Call back number: Pharmacy:  Reason for call:  Patient requesting podiatry referral due to bunions on both feet, please advise

## 2016-08-09 NOTE — Telephone Encounter (Signed)
Referral placed.

## 2016-08-13 NOTE — Progress Notes (Addendum)
Subjective:   Stephanie Parks is a 71 y.o. female who presents for an Initial Medicare Annual Wellness Visit.  Still working 7hrs/ 3 days per week.  Review of Systems    No ROS.  Medicare Wellness Visit. Additional risk factors are reflected in the social history. Cardiac Risk Factors include: advanced age (>52men, >58 women);dyslipidemia;hypertension Sleep patterns:   Sleeps about 6-7 hrs per night. Feels rested. Naps on days off. Home Safety/Smoke Alarms: Feels safe in home. Smoke alarms in place.  Living environment; residence and Firearm Safety: Lives alone. No stairs. No guns. Seat Belt Safety/Bike Helmet: Wears seat belt.   Counseling:   Eye Exam- Wearing glasses. Dr.Polk yearly. Dental-yearly and as needed. Dr.Wallace  Female:   Pap- Hysterectomy.      Mammo- Last 08/17/15: BI-RADS CATEGORY  1: Negative.       Dexa scan-        CCS- Last 05/07/11: Normal. Recall 10 yrs    Objective:    Today's Vitals   08/15/16 1308  BP: (!) 118/58  Pulse: 84  SpO2: 98%  Weight: 207 lb (93.9 kg)  Height: 5\' 6"  (1.676 m)   Body mass index is 33.41 kg/m.   Current Medications (verified) Outpatient Encounter Prescriptions as of 08/15/2016  Medication Sig  . aspirin 81 MG tablet Take 81 mg by mouth daily.  . fluticasone (FLONASE) 50 MCG/ACT nasal spray USE 2 SPRAYS IN NOSTRIL(S) DAILY AS NEEDED.  Marland Kitchen KLOR-CON M20 20 MEQ tablet TAKE 1/2 OR 1 PILL DAILY  . losartan-hydrochlorothiazide (HYZAAR) 100-25 MG per tablet Take 1 tablet by mouth daily.    Marland Kitchen lovastatin (MEVACOR) 20 MG tablet Take 20 mg by mouth.  . Multiple Vitamins-Minerals (MULTIVITAL) tablet Take by mouth.  . NONFORMULARY OR COMPOUNDED ITEM Allergen immunotherapy (Patient taking differently: Allergy shots (new vial: shot given weekly for four weeks) Then once every four weeks.)  . nortriptyline (PAMELOR) 25 MG capsule Take 1 capsule (25 mg total) by mouth at bedtime.  Marland Kitchen omeprazole (PRILOSEC) 20 MG capsule Take 20 mg by  mouth daily.   No facility-administered encounter medications on file as of 08/15/2016.     Allergies (verified) Shellfish allergy; Ace inhibitors; Pitavastatin; Shellfish-derived products; Sulfamethoxazole; and Sulfa antibiotics   History: Past Medical History:  Diagnosis Date  . GERD (gastroesophageal reflux disease)   . Hiatal hernia   . Hyperlipidemia   . Hypertension   . Migraine headache    Past Surgical History:  Procedure Laterality Date  . ABDOMINAL HYSTERECTOMY    . BACK SURGERY     x 3  . CARPAL TUNNEL RELEASE     left wrist  . CERVICAL SPINE SURGERY    . HEMORRHOID SURGERY     Family History  Problem Relation Age of Onset  . Allergic rhinitis Sister   . Diabetes Sister   . Hypertension Sister   . Heart attack Father 60  . Heart disease Father   . Stomach cancer Maternal Grandmother   . Heart disease Mother   . Colon cancer Neg Hx   . Angioedema Neg Hx   . Asthma Neg Hx   . Eczema Neg Hx   . Urticaria Neg Hx   . Immunodeficiency Neg Hx    Social History   Occupational History  . [employer]   Social History Main Topics  . Smoking status: Former Smoker    Packs/day: 0.50    Years: 20.00    Types: Cigarettes    Quit  date: 02/19/1984  . Smokeless tobacco: Never Used  . Alcohol use No  . Drug use: No  . Sexual activity: Yes    Tobacco Counseling Counseling given: No   Activities of Daily Living In your present state of health, do you have any difficulty performing the following activities: 08/15/2016 06/14/2016  Hearing? N N  Vision? N N  Difficulty concentrating or making decisions? N N  Walking or climbing stairs? N N  Dressing or bathing? N N  Doing errands, shopping? N N  Preparing Food and eating ? N -  Using the Toilet? N -  In the past six months, have you accidently leaked urine? Y -  Do you have problems with loss of bowel control? N -  Managing your Medications? N -  Managing your Finances? N -  Housekeeping or  managing your Housekeeping? N -  Some recent data might be hidden    Immunizations and Health Maintenance  There is no immunization history on file for this patient. Health Maintenance Due  Topic Date Due  . FOOT EXAM  10/09/1955  . OPHTHALMOLOGY EXAM  10/09/1955  . TETANUS/TDAP  10/08/1964  . DEXA SCAN  10/09/2010    Patient Care Team: Copland, Gay Filler, MD as PCP - General (Family Medicine)  Indicate any recent Medical Services you may have received from other than [facility] providers in the past year (date may be approximate).     Assessment:   This is a routine wellness examination for [facility]. Physical assessment deferred to PCP.  Hearing/Vision screen No exam data present  Dietary issues and exercise activities discussed: Current Exercise Habits: The patient does not participate in regular exercise at present, Exercise limited by: None identified   Diet (meal preparation, eat out, water intake, caffeinated beverages, dairy products, fruits and vegetables):   24 Hour Recall: Breakfast: bacon and egg omelette and toast, decaf coffee Lunch: skipped Dinner:  Grilled chicken and potatoes and vegetables , soda  Goals      Patient Stated   . Weight (lb) < 200 lb (90.7 kg) (pt-stated)          Diet and exercise.      Depression Screen PHQ 2/9 Scores 08/15/2016 10/09/2015  PHQ - 2 Score 0 0    Fall Risk Fall Risk  08/15/2016 10/09/2015  Falls in the past year? No No    Cognitive Function: Ad8 score reviewed for issues:  Issues making decisions:no  Less interest in hobbies / activities:no  Repeats questions, stories (family complaining):no  Trouble using ordinary gadgets (microwave, computer, phone):no  Forgets the month or year: no  Mismanaging finances: no  Remembering appts:no  Daily problems with thinking and/or memory:no Ad8 score is=0         Screening Tests Health Maintenance  Topic Date Due  . FOOT EXAM  10/09/1955  . OPHTHALMOLOGY EXAM   10/09/1955  . TETANUS/TDAP  10/08/1964  . DEXA SCAN  10/09/2010  . PNA vac Low Risk Adult (1 of 2 - PCV13) 10/08/2016 (Originally 10/09/2010)  . INFLUENZA VACCINE  09/18/2016  . HEMOGLOBIN A1C  11/01/2016  . MAMMOGRAM  08/16/2017  . COLONOSCOPY  05/06/2021  . Hepatitis C Screening  Completed      Plan:   Follow up with Dr.Copland as scheduled 12-19-16.  Continue to eat heart healthy diet (full of fruits, vegetables, whole grains, lean protein, water--limit salt, fat, and sugar intake) and increase physical activity as tolerated.  Continue doing brain stimulating activities (puzzles, reading, adult  coloring books, staying active) to keep memory sharp.   Schedule mammogram and bone density scan. Orders placed today.   I have personally reviewed and noted the following in the patient's chart:   . Medical and social history . Use of alcohol, tobacco or illicit drugs  . Current medications and supplements . Functional ability and status . Nutritional status . Physical activity . Advanced directives . List of other physicians . Hospitalizations, surgeries, and ER visits in previous 12 months . Vitals . Screenings to include cognitive, depression, and falls . Referrals and appointments  In addition, I have reviewed and discussed with patient certain preventive protocols, quality metrics, and best practice recommendations. A written personalized care plan for preventive services as well as general preventive health recommendations were provided to patient.     Shela Nevin, [place]   08/15/2016    I have reviewed the above note by Ms. Vevelyn Royals and agree with her documentation   Reviewed note and agree with assessment, plan and recommendation. She will follow up with pcp. Reviewing this in light of Dr. Lorelei Pont abscense.

## 2016-08-15 ENCOUNTER — Ambulatory Visit (INDEPENDENT_AMBULATORY_CARE_PROVIDER_SITE_OTHER): Payer: Medicare Other | Admitting: *Deleted

## 2016-08-15 ENCOUNTER — Encounter: Payer: Self-pay | Admitting: *Deleted

## 2016-08-15 VITALS — BP 118/58 | HR 84 | Ht 66.0 in | Wt 207.0 lb

## 2016-08-15 DIAGNOSIS — M4726 Other spondylosis with radiculopathy, lumbar region: Secondary | ICD-10-CM | POA: Diagnosis not present

## 2016-08-15 DIAGNOSIS — Z1239 Encounter for other screening for malignant neoplasm of breast: Secondary | ICD-10-CM

## 2016-08-15 DIAGNOSIS — Z780 Asymptomatic menopausal state: Secondary | ICD-10-CM

## 2016-08-15 DIAGNOSIS — M5416 Radiculopathy, lumbar region: Secondary | ICD-10-CM | POA: Diagnosis not present

## 2016-08-15 DIAGNOSIS — Z0000 Encounter for general adult medical examination without abnormal findings: Secondary | ICD-10-CM

## 2016-08-15 NOTE — Patient Instructions (Signed)
Stephanie Parks , Thank you for taking time to come for your Medicare Wellness Visit. I appreciate your ongoing commitment to your health goals. Please review the following plan we discussed and let me know if I can assist you in the future.   These are the goals we discussed: Goals      Patient Stated   . Weight (lb) < 200 lb (90.7 kg) (pt-stated)          Diet and exercise.       This is a list of the screening recommended for you and due dates:  Health Maintenance  Topic Date Due  . Complete foot exam   10/09/1955  . Eye exam for diabetics  10/09/1955  . Tetanus Vaccine  10/08/1964  . DEXA scan (bone density measurement)  10/09/2010  . Pneumonia vaccines (1 of 2 - PCV13) 10/08/2016*  . Flu Shot  09/18/2016  . Hemoglobin A1C  11/01/2016  . Mammogram  08/16/2017  . Colon Cancer Screening  05/06/2021  .  Hepatitis C: One time screening is recommended by Center for Disease Control  (CDC) for  adults born from [birth year] through 1965.   Completed  *Topic was postponed. The date shown is not the original due date.    Follow up with Dr.Copland as scheduled 12-19-16.  Continue to eat heart healthy diet (full of fruits, vegetables, whole grains, lean protein, water--limit salt, fat, and sugar intake) and increase physical activity as tolerated.  Continue doing brain stimulating activities (puzzles, reading, adult coloring books, staying active) to keep memory sharp.    Calorie Counting for Weight Loss Calories are units of energy. Your body needs a certain amount of calories from food to keep you going throughout the day. When you eat more calories than your body needs, your body stores the extra calories as fat. When you eat fewer calories than your body needs, your body burns fat to get the energy it needs. Calorie counting means keeping track of how many calories you eat and drink each day. Calorie counting can be helpful if you need to lose weight. If you make sure to eat fewer calories than  your body needs, you should lose weight. Ask your health care provider what a healthy weight is for you. For calorie counting to work, you will need to eat the right number of calories in a day in order to lose a healthy amount of weight per week. A dietitian can help you determine how many calories you need in a day and will give you suggestions on how to reach your calorie goal.  A healthy amount of weight to lose per week is usually 1-2 lb (0.5-0.9 kg). This usually means that your daily calorie intake should be reduced by 500-750 calories.  Eating 1,200 - 1,500 calories per day can help most women lose weight.  Eating 1,500 - 1,800 calories per day can help most men lose weight.  What is my plan? My goal is to have __________ calories per day. If I have this many calories per day, I should lose around __________ pounds per week. What do I need to know about calorie counting? In order to meet your daily calorie goal, you will need to:  Find out how many calories are in each food you would like to eat. Try to do this before you eat.  Decide how much of the food you plan to eat.  Write down what you ate and how many calories it had. Doing this  is called keeping a food log.  To successfully lose weight, it is important to balance calorie counting with a healthy lifestyle that includes regular activity. Aim for 150 minutes of moderate exercise (such as walking) or 75 minutes of vigorous exercise (such as running) each week. Where do I find calorie information?  The number of calories in a food can be found on a Nutrition Facts label. If a food does not have a Nutrition Facts label, try to look up the calories online or ask your dietitian for help. Remember that calories are listed per serving. If you choose to have more than one serving of a food, you will have to multiply the calories per serving by the amount of servings you plan to eat. For example, the label on a package of bread might  say that a serving size is 1 slice and that there are 90 calories in a serving. If you eat 1 slice, you will have eaten 90 calories. If you eat 2 slices, you will have eaten 180 calories. How do I keep a food log? Immediately after each meal, record the following information in your food log:  What you ate. Don't forget to include toppings, sauces, and other extras on the food.  How much you ate. This can be measured in cups, ounces, or number of items.  How many calories each food and drink had.  The total number of calories in the meal.  Keep your food log near you, such as in a small notebook in your pocket, or use a mobile app or website. Some programs will calculate calories for you and show you how many calories you have left for the day to meet your goal. What are some calorie counting tips?  Use your calories on foods and drinks that will fill you up and not leave you hungry: ? Some examples of foods that fill you up are nuts and nut butters, vegetables, lean proteins, and high-fiber foods like whole grains. High-fiber foods are foods with more than 5 g fiber per serving. ? Drinks such as sodas, specialty coffee drinks, alcohol, and juices have a lot of calories, yet do not fill you up.  Eat nutritious foods and avoid empty calories. Empty calories are calories you get from foods or beverages that do not have many vitamins or protein, such as candy, sweets, and soda. It is better to have a nutritious high-calorie food (such as an avocado) than a food with few nutrients (such as a bag of chips).  Know how many calories are in the foods you eat most often. This will help you calculate calorie counts faster.  Pay attention to calories in drinks. Low-calorie drinks include water and unsweetened drinks.  Pay attention to nutrition labels for "low fat" or "fat free" foods. These foods sometimes have the same amount of calories or more calories than the full fat versions. They also often  have added sugar, starch, or salt, to make up for flavor that was removed with the fat.  Find a way of tracking calories that works for you. Get creative. Try different apps or programs if writing down calories does not work for you. What are some portion control tips?  Know how many calories are in a serving. This will help you know how many servings of a certain food you can have.  Use a measuring cup to measure serving sizes. You could also try weighing out portions on a kitchen scale. With time, you will be  able to estimate serving sizes for some foods.  Take some time to put servings of different foods on your favorite plates, bowls, and cups so you know what a serving looks like.  Try not to eat straight from a bag or box. Doing this can lead to overeating. Put the amount you would like to eat in a cup or on a plate to make sure you are eating the right portion.  Use smaller plates, glasses, and bowls to prevent overeating.  Try not to multitask (for example, watch TV or use your computer) while eating. If it is time to eat, sit down at a table and enjoy your food. This will help you to know when you are full. It will also help you to be aware of what you are eating and how much you are eating. What are tips for following this plan? Reading food labels  Check the calorie count compared to the serving size. The serving size may be smaller than what you are used to eating.  Check the source of the calories. Make sure the food you are eating is high in vitamins and protein and low in saturated and trans fats. Shopping  Read nutrition labels while you shop. This will help you make healthy decisions before you decide to purchase your food.  Make a grocery list and stick to it. Cooking  Try to cook your favorite foods in a healthier way. For example, try baking instead of frying.  Use low-fat dairy products. Meal planning  Use more fruits and vegetables. Half of your plate should be  fruits and vegetables.  Include lean proteins like poultry and fish. How do I count calories when eating out?  Ask for smaller portion sizes.  Consider sharing an entree and sides instead of getting your own entree.  If you get your own entree, eat only half. Ask for a box at the beginning of your meal and put the rest of your entree in it so you are not tempted to eat it.  If calories are listed on the menu, choose the lower calorie options.  Choose dishes that include vegetables, fruits, whole grains, low-fat dairy products, and lean protein.  Choose items that are boiled, broiled, grilled, or steamed. Stay away from items that are buttered, battered, fried, or served with cream sauce. Items labeled "crispy" are usually fried, unless stated otherwise.  Choose water, low-fat milk, unsweetened iced tea, or other drinks without added sugar. If you want an alcoholic beverage, choose a lower calorie option such as a glass of wine or light beer.  Ask for dressings, sauces, and syrups on the side. These are usually high in calories, so you should limit the amount you eat.  If you want a salad, choose a garden salad and ask for grilled meats. Avoid extra toppings like bacon, cheese, or fried items. Ask for the dressing on the side, or ask for olive oil and vinegar or lemon to use as dressing.  Estimate how many servings of a food you are given. For example, a serving of cooked rice is  cup or about the size of half a baseball. Knowing serving sizes will help you be aware of how much food you are eating at restaurants. The list below tells you how big or small some common portion sizes are based on everyday objects: ? 1 oz-4 stacked dice. ? 3 oz-1 deck of cards. ? 1 tsp-1 die. ? 1 Tbsp- a ping-pong ball. ? 2 Tbsp-1 ping-pong  ball. ?  cup- baseball. ? 1 cup-1 baseball. Summary  Calorie counting means keeping track of how many calories you eat and drink each day. If you eat fewer calories  than your body needs, you should lose weight.  A healthy amount of weight to lose per week is usually 1-2 lb (0.5-0.9 kg). This usually means reducing your daily calorie intake by 500-750 calories.  The number of calories in a food can be found on a Nutrition Facts label. If a food does not have a Nutrition Facts label, try to look up the calories online or ask your dietitian for help.  Use your calories on foods and drinks that will fill you up, and not on foods and drinks that will leave you hungry.  Use smaller plates, glasses, and bowls to prevent overeating. This information is not intended to replace advice given to you by your health care provider. Make sure you discuss any questions you have with your health care provider. Document Released: 02/04/2005 Document Revised: 01/05/2016 Document Reviewed: 01/05/2016 Elsevier Interactive Patient Education  2017 [place] Maintenance for Postmenopausal Women Menopause is a normal process in which your reproductive ability comes to an end. This process happens gradually over a span of months to years, usually between the ages of 5 and 62. Menopause is complete when you have missed 12 consecutive menstrual periods. It is important to talk with your health care provider about some of the most common conditions that affect postmenopausal women, such as heart disease, cancer, and bone loss (osteoporosis). Adopting a healthy lifestyle and getting preventive care can help to promote your health and wellness. Those actions can also lower your chances of developing some of these common conditions. What should I know about menopause? During menopause, you may experience a number of symptoms, such as:  Moderate-to-severe hot flashes.  Night sweats.  Decrease in sex drive.  Mood swings.  Headaches.  Tiredness.  Irritability.  Memory problems.  Insomnia.  Choosing to treat or not to treat menopausal changes is an individual  decision that you make with your health care provider. What should I know about hormone replacement therapy and supplements? Hormone therapy products are effective for treating symptoms that are associated with menopause, such as hot flashes and night sweats. Hormone replacement carries certain risks, especially as you become older. If you are thinking about using estrogen or estrogen with progestin treatments, discuss the benefits and risks with your health care provider. What should I know about heart disease and stroke? Heart disease, heart attack, and stroke become more likely as you age. This may be due, in part, to the hormonal changes that your body experiences during menopause. These can affect how your body processes dietary fats, triglycerides, and cholesterol. Heart attack and stroke are both medical emergencies. There are many things that you can do to help prevent heart disease and stroke:  Have your blood pressure checked at least every 1-2 years. High blood pressure causes heart disease and increases the risk of stroke.  If you are 45-28 years old, ask your health care provider if you should take aspirin to prevent a heart attack or a stroke.  Do not use any tobacco products, including cigarettes, chewing tobacco, or electronic cigarettes. If you need help quitting, ask your health care provider.  It is important to eat a healthy diet and maintain a healthy weight. ? Be sure to include plenty of vegetables, fruits, low-fat dairy products, and lean protein. ? Avoid eating  foods that are high in solid fats, added sugars, or salt (sodium).  Get regular exercise. This is one of the most important things that you can do for your health. ? Try to exercise for at least 150 minutes each week. The type of exercise that you do should increase your heart rate and make you sweat. This is known as moderate-intensity exercise. ? Try to do strengthening exercises at least twice each week. Do these  in addition to the moderate-intensity exercise.  Know your numbers.Ask your health care provider to check your cholesterol and your blood glucose. Continue to have your blood tested as directed by your health care provider.  What should I know about cancer screening? There are several types of cancer. Take the following steps to reduce your risk and to catch any cancer development as early as possible. Breast Cancer  Practice breast self-awareness. ? This means understanding how your breasts normally appear and feel. ? It also means doing regular breast self-exams. Let your health care provider know about any changes, no matter how small.  If you are 72 or older, have a clinician do a breast exam (clinical breast exam or CBE) every year. Depending on your age, family history, and medical history, it may be recommended that you also have a yearly breast X-ray (mammogram).  If you have a family history of breast cancer, talk with your health care provider about genetic screening.  If you are at high risk for breast cancer, talk with your health care provider about having an MRI and a mammogram every year.  Breast cancer (BRCA) gene test is recommended for women who have family members with BRCA-related cancers. Results of the assessment will determine the need for genetic counseling and BRCA1 and for BRCA2 testing. BRCA-related cancers include these types: ? Breast. This occurs in males or females. ? Ovarian. ? Tubal. This may also be called fallopian tube cancer. ? Cancer of the abdominal or pelvic lining (peritoneal cancer). ? Prostate. ? Pancreatic.  Cervical, Uterine, and Ovarian Cancer Your health care provider may recommend that you be screened regularly for cancer of the pelvic organs. These include your ovaries, uterus, and vagina. This screening involves a pelvic exam, which includes checking for microscopic changes to the surface of your cervix (Pap test).  For women ages 21-65,  health care providers may recommend a pelvic exam and a Pap test every three years. For women ages 79-65, they may recommend the Pap test and pelvic exam, combined with testing for human papilloma virus (HPV), every five years. Some types of HPV increase your risk of cervical cancer. Testing for HPV may also be done on women of any age who have unclear Pap test results.  Other health care providers may not recommend any screening for nonpregnant women who are considered low risk for pelvic cancer and have no symptoms. Ask your health care provider if a screening pelvic exam is right for you.  If you have had past treatment for cervical cancer or a condition that could lead to cancer, you need Pap tests and screening for cancer for at least 20 years after your treatment. If Pap tests have been discontinued for you, your risk factors (such as having a new sexual partner) need to be reassessed to determine if you should start having screenings again. Some women have medical problems that increase the chance of getting cervical cancer. In these cases, your health care provider may recommend that you have screening and Pap tests more  often.  If you have a family history of uterine cancer or ovarian cancer, talk with your health care provider about genetic screening.  If you have vaginal bleeding after reaching menopause, tell your health care provider.  There are currently no reliable tests available to screen for ovarian cancer.  Lung Cancer Lung cancer screening is recommended for adults 88-76 years old who are at high risk for lung cancer because of a history of smoking. A yearly low-dose CT scan of the lungs is recommended if you:  Currently smoke.  Have a history of at least 30 pack-years of smoking and you currently smoke or have quit within the past 15 years. A pack-year is smoking an average of one pack of cigarettes per day for one year.  Yearly screening should:  Continue until it has been  15 years since you quit.  Stop if you develop a health problem that would prevent you from having lung cancer treatment.  Colorectal Cancer  This type of cancer can be detected and can often be prevented.  Routine colorectal cancer screening usually begins at age 70 and continues through age 66.  If you have risk factors for colon cancer, your health care provider may recommend that you be screened at an earlier age.  If you have a family history of colorectal cancer, talk with your health care provider about genetic screening.  Your health care provider may also recommend using home test kits to check for hidden blood in your stool.  A small camera at the end of a tube can be used to examine your colon directly (sigmoidoscopy or colonoscopy). This is done to check for the earliest forms of colorectal cancer.  Direct examination of the colon should be repeated every 5-10 years until age 67. However, if early forms of precancerous polyps or small growths are found or if you have a family history or genetic risk for colorectal cancer, you may need to be screened more often.  Skin Cancer  Check your skin from head to toe regularly.  Monitor any moles. Be sure to tell your health care provider: ? About any new moles or changes in moles, especially if there is a change in a mole's shape or color. ? If you have a mole that is larger than the size of a pencil eraser.  If any of your family members has a history of skin cancer, especially at a young age, talk with your health care provider about genetic screening.  Always use sunscreen. Apply sunscreen liberally and repeatedly throughout the day.  Whenever you are outside, protect yourself by wearing long sleeves, pants, a wide-brimmed hat, and sunglasses.  What should I know about osteoporosis? Osteoporosis is a condition in which bone destruction happens more quickly than new bone creation. After menopause, you may be at an increased  risk for osteoporosis. To help prevent osteoporosis or the bone fractures that can happen because of osteoporosis, the following is recommended:  If you are 64-11 years old, get at least 1,000 mg of calcium and at least 600 mg of vitamin D per day.  If you are older than age 20 but younger than age 67, get at least 1,200 mg of calcium and at least 600 mg of vitamin D per day.  If you are older than age 4, get at least 1,200 mg of calcium and at least 800 mg of vitamin D per day.  Smoking and excessive alcohol intake increase the risk of osteoporosis. Eat foods that  are rich in calcium and vitamin D, and do weight-bearing exercises several times each week as directed by your health care provider. What should I know about how menopause affects my mental health? Depression may occur at any age, but it is more common as you become older. Common symptoms of depression include:  Low or sad mood.  Changes in sleep patterns.  Changes in appetite or eating patterns.  Feeling an overall lack of motivation or enjoyment of activities that you previously enjoyed.  Frequent crying spells.  Talk with your health care provider if you think that you are experiencing depression. What should I know about immunizations? It is important that you get and maintain your immunizations. These include:  Tetanus, diphtheria, and pertussis (Tdap) booster vaccine.  Influenza every year before the flu season begins.  Pneumonia vaccine.  Shingles vaccine.  Your health care provider may also recommend other immunizations. This information is not intended to replace advice given to you by your health care provider. Make sure you discuss any questions you have with your health care provider. Document Released: 03/29/2005 Document Revised: 08/25/2015 Document Reviewed: 11/08/2014 Elsevier Interactive Patient Education  2018 [employer].

## 2016-08-26 ENCOUNTER — Ambulatory Visit (INDEPENDENT_AMBULATORY_CARE_PROVIDER_SITE_OTHER): Payer: Medicare Other | Admitting: Family Medicine

## 2016-08-26 VITALS — BP 111/56 | HR 90 | Temp 98.7°F | Ht 66.0 in | Wt 204.6 lb

## 2016-08-26 DIAGNOSIS — S46912A Strain of unspecified muscle, fascia and tendon at shoulder and upper arm level, left arm, initial encounter: Secondary | ICD-10-CM

## 2016-08-26 DIAGNOSIS — I10 Essential (primary) hypertension: Secondary | ICD-10-CM | POA: Diagnosis not present

## 2016-08-26 DIAGNOSIS — R05 Cough: Secondary | ICD-10-CM | POA: Diagnosis not present

## 2016-08-26 DIAGNOSIS — Z5181 Encounter for therapeutic drug level monitoring: Secondary | ICD-10-CM

## 2016-08-26 DIAGNOSIS — R059 Cough, unspecified: Secondary | ICD-10-CM

## 2016-08-26 LAB — CBC
HCT: 34.9 % — ABNORMAL LOW (ref 36.0–46.0)
Hemoglobin: 11.5 g/dL — ABNORMAL LOW (ref 12.0–15.0)
MCHC: 33 g/dL (ref 30.0–36.0)
MCV: 88.1 fl (ref 78.0–100.0)
Platelets: 375 10*3/uL (ref 150.0–400.0)
RBC: 3.96 Mil/uL (ref 3.87–5.11)
RDW: 15.2 % (ref 11.5–15.5)
WBC: 10.8 10*3/uL — ABNORMAL HIGH (ref 4.0–10.5)

## 2016-08-26 LAB — BASIC METABOLIC PANEL
BUN: 22 mg/dL (ref 6–23)
CALCIUM: 9.8 mg/dL (ref 8.4–10.5)
CO2: 33 meq/L — AB (ref 19–32)
CREATININE: 1.02 mg/dL (ref 0.40–1.20)
Chloride: 100 mEq/L (ref 96–112)
GFR: 68.73 mL/min (ref >60.00)
GLUCOSE: 93 mg/dL (ref 70–99)
Potassium: 3.5 mEq/L (ref 3.5–5.1)
SODIUM: 139 meq/L (ref 135–145)

## 2016-08-26 MED ORDER — BENZONATATE 100 MG PO CAPS: 100.0000 mg | ORAL_CAPSULE | Freq: Three times a day (TID) | ORAL | 0 refills | Status: DC | PRN

## 2016-08-26 MED ORDER — LOSARTAN POTASSIUM-HCTZ 50-12.5 MG PO TABS: 1.0000 | ORAL_TABLET | Freq: Every day | ORAL | 3 refills | Status: DC

## 2016-08-26 MED ORDER — POTASSIUM CHLORIDE CRYS ER 20 MEQ PO TBCR: 20.0000 meq | EXTENDED_RELEASE_TABLET | Freq: Every day | ORAL | 3 refills | Status: DC

## 2016-08-26 NOTE — Patient Instructions (Signed)
I think that you have a viral illness, but will alert you if your labs suggest a bacterial infection Use the tessalon as needed for cough, but let me know if this is not improved soon We will change your losartan/hct to a lower dose so you can just take 1 whole pill a day We also refilled your potassium supplement today   I'll be in touch with your labs

## 2016-08-26 NOTE — Progress Notes (Addendum)
[facility] [address] [place], [place] [address] [phone]  Date:  08/26/2016   Name:  Stephanie Parks   DOB:  [birth year]   MRN:  [id number]  PCP:  [name], MD    Chief Complaint: Cough (c/o dry cough and runny nose x 4-5 day. Pt denies any sob, wheezing or chest tighness. )   History of Present Illness:  Stephanie Parks is a 71 y.o. very pleasant female patient who presents with the following:  Last seen by myself in May of this year.  She has noted a dry cough and runny nose, some sneezing for 4-5 days No fever The cough is non- productive.  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  Her biggest complaint is her left arm- she uses this arm to do most of her mixing.   She has noted pain in her left shoulder for about one week, she cannot lie on this shoulder at night. It hurts more after a shift at work She has not had shoulder issues in the past   Former smoker She sees cardiology, last visit in May Lab Results  Component Value Date   HGBA1C 5.9 05/01/2016    Patient Active Problem List   Diagnosis Date Noted  . 3-vessel CAD 03/01/2015  . Aneurysm ([place]) 03/01/2015  . Dizziness 03/01/2015  . Hypercholesterolemia 03/01/2015  . BP (high blood pressure) 03/01/2015  . Decreased potassium in the blood 03/01/2015  .  Paresthesia of arm 03/01/2015  . Allergic rhinitis due to pollen 03/01/2015  . Achalasia 09/28/2012  . CN (constipation) 09/28/2012  . Cephalalgia 08/24/2012  . HTN (hypertension) 04/19/2011  . Hyperlipidemia 04/19/2011  . GERD (gastroesophageal reflux disease) 04/19/2011  . Chest pain 04/04/2011    Past Medical History:  Diagnosis Date  . GERD (gastroesophageal reflux disease)   . Hiatal hernia   . Hyperlipidemia   . Hypertension   . Migraine headache     Past Surgical History:  Procedure Laterality Date  . ABDOMINAL HYSTERECTOMY    . BACK SURGERY     x 3  . CARPAL TUNNEL RELEASE     left wrist  . CERVICAL SPINE SURGERY    . HEMORRHOID SURGERY      Social History  Substance Use Topics  . Smoking status: Former Smoker    Packs/day: 0.50    Years: 20.00    Types: Cigarettes    Quit date: 02/19/1984  . Smokeless tobacco: Never Used  . Alcohol use No    Family History  Problem Relation Age of Onset  . Allergic rhinitis Sister   . Diabetes Sister   . Hypertension Sister   . Heart attack Father 71  .  Heart disease Father   . Stomach cancer Maternal Grandmother   . Heart disease Mother   . Colon cancer Neg Hx   . Angioedema Neg Hx   . Asthma Neg Hx   . Eczema Neg Hx   . Urticaria Neg Hx   . Immunodeficiency Neg Hx     Allergies  Allergen Reactions  . Shellfish Allergy Hives and Swelling  . Ace Inhibitors Other (See Comments)  . Pitavastatin   . Shellfish-Derived Products Other (See Comments)    Hives Hives  . Sulfamethoxazole Other (See Comments)    Hives  . Sulfa Antibiotics Other (See Comments) and Rash    Fine bumps    Medication list has been reviewed and updated.  Current Outpatient Prescriptions on File Prior to Visit  Medication Sig Dispense Refill  . aspirin 81 MG tablet Take 81 mg by mouth daily.    . fluticasone (FLONASE) 50 MCG/ACT nasal spray USE 2 SPRAYS IN NOSTRIL(S) DAILY AS NEEDED.  3  . lovastatin (MEVACOR) 20 MG tablet Take 20  mg by mouth.    . Multiple Vitamins-Minerals (MULTIVITAL) tablet Take by mouth.    . NONFORMULARY OR COMPOUNDED ITEM Allergen immunotherapy (Patient taking differently: Allergy shots (new vial: shot given weekly for four weeks) Then once every four weeks.) 2 each 1  . nortriptyline (PAMELOR) 25 MG capsule Take 1 capsule (25 mg total) by mouth at bedtime. 90 capsule 3  . omeprazole (PRILOSEC) 20 MG capsule Take 20 mg by mouth daily.    . [DISCONTINUED] esomeprazole (NEXIUM) 40 MG capsule Take 1 capsule (40 mg total) by mouth daily. 30 capsule 3   No current facility-administered medications on file prior to visit.     Review of Systems:  As per HPI- otherwise negative.   Physical Examination: Vitals:   08/26/16 1309  BP: (!) 111/56  Pulse: 90  Temp: 98.7 F (37.1 C)   Vitals:   08/26/16 1309  Weight: 204 lb 9.6 oz (92.8 kg)  Height: 5\' 6"  (1.676 m)   Body mass index is 33.02 kg/m. Ideal Body Weight: Weight in (lb) to have BMI = 25: 154.6  GEN: WDWN, NAD, Non-toxic, A & O x 3, overweight, occasional cough in room HEENT: Atraumatic, Normocephalic. Neck supple. No masses, No LAD.  Bilateral TM wnl, oropharynx normal.  PEERL,EOMI.   Ears and Nose: No external deformity. CV: RRR, No M/G/R. No JVD. No thrill. No extra heart sounds. PULM: CTA B, no wheezes, crackles, rhonchi. No retractions. No resp. distress. No accessory muscle use. EXTR: No c/c/e NEURO Normal gait.  PSYCH: Normally interactive. Conversant. Not depressed or anxious appearing.  Calm demeanor.  Her left shoulder is painful to ROM, especially internal and external rotation. She has pain with palpation of the anterior RCT insertion  Assessment and Plan: Cough - Plan: benzonatate (TESSALON) 100 MG capsule, CBC  Medication monitoring encounter - Plan: Basic metabolic panel, potassium chloride SA (KLOR-CON M20) 20 MEQ tablet  Essential hypertension - Plan: losartan-hydrochlorothiazide (HYZAAR) 50-12.5 MG  tablet  Strain of left shoulder, initial encounter  Here today with a few concerns  She has a left shoulder overuse injury.  She will be on vacation for a week starting in 2 days. Offered to take her OOW for the next 2 days so she can start resting her shoulder.  She declines but will let me know if her shoulder is not back to normal after her upcoming vacation Will change her BP med to the 50/12.5 strength  since she is taking this dose Refilled BP med and potassium, check BMP today Tessalon prn for cough Will plan further follow- up pending labs.   Signed Lamar Blinks, MD  Received her labs 7/10 Colonoscopy 2013 Her hg was 11.9,  In 2011, 11.1 in 2013; minimally low red counts are likely normal for her Results for orders placed or performed in visit on 55/97/41  Basic metabolic panel  Result Value Ref Range   Sodium 139 135 - 145 mEq/L   Potassium 3.5 3.5 - 5.1 mEq/L   Chloride 100 96 - 112 mEq/L   CO2 33 (H) 19 - 32 mEq/L   Glucose, Bld 93 70 - 99 mg/dL   BUN 22 6 - 23 mg/dL   Creatinine, Ser 1.02 0.40 - 1.20 mg/dL   Calcium 9.8 8.4 - 10.5 mg/dL   GFR 68.73 >60.00 mL/min  CBC  Result Value Ref Range   WBC 10.8 (H) 4.0 - 10.5 K/uL   RBC 3.96 3.87 - 5.11 Mil/uL   Platelets 375.0 150.0 - 400.0 K/uL   Hemoglobin 11.5 (L) 12.0 - 15.0 g/dL   HCT 34.9 (L) 36.0 - 46.0 %   MCV 88.1 78.0 - 100.0 fl   MCHC 33.0 30.0 - 36.0 g/dL   RDW 15.2 11.5 - 15.5 %

## 2016-08-27 ENCOUNTER — Encounter: Payer: Self-pay | Admitting: Family Medicine

## 2016-09-03 ENCOUNTER — Ambulatory Visit: Payer: Medicare Other | Admitting: Podiatry

## 2016-09-12 ENCOUNTER — Ambulatory Visit (HOSPITAL_BASED_OUTPATIENT_CLINIC_OR_DEPARTMENT_OTHER)
Admission: RE | Admit: 2016-09-12 | Discharge: 2016-09-12 | Disposition: A | Discharge status: Home / Self Care | Payer: Medicare Other | Source: Ambulatory Visit | Attending: Medical | Admitting: Medical

## 2016-09-12 ENCOUNTER — Encounter (HOSPITAL_BASED_OUTPATIENT_CLINIC_OR_DEPARTMENT_OTHER): Payer: Self-pay

## 2016-09-12 DIAGNOSIS — Z1239 Encounter for other screening for malignant neoplasm of breast: Secondary | ICD-10-CM

## 2016-09-12 DIAGNOSIS — M85852 Other specified disorders of bone density and structure, left thigh: Secondary | ICD-10-CM | POA: Diagnosis not present

## 2016-09-12 DIAGNOSIS — M8588 Other specified disorders of bone density and structure, other site: Secondary | ICD-10-CM | POA: Diagnosis not present

## 2016-09-12 DIAGNOSIS — Z780 Asymptomatic menopausal state: Secondary | ICD-10-CM | POA: Insufficient documentation

## 2016-09-12 DIAGNOSIS — Z1231 Encounter for screening mammogram for malignant neoplasm of breast: Secondary | ICD-10-CM | POA: Insufficient documentation

## 2016-09-13 ENCOUNTER — Telehealth: Payer: Self-pay | Admitting: Medical

## 2016-09-13 DIAGNOSIS — M8580 Other specified disorders of bone density and structure, unspecified site: Secondary | ICD-10-CM

## 2016-09-13 MED ORDER — ALENDRONATE SODIUM 10 MG PO TABS: 10.0000 mg | ORAL_TABLET | Freq: Every day | ORAL | 11 refills | Status: DC

## 2016-09-13 NOTE — Telephone Encounter (Signed)
Vit d lab placed and rx fosamax.

## 2016-09-17 ENCOUNTER — Ambulatory Visit (INDEPENDENT_AMBULATORY_CARE_PROVIDER_SITE_OTHER): Payer: Medicare Other | Admitting: Podiatry

## 2016-09-17 ENCOUNTER — Encounter: Payer: Self-pay | Admitting: Podiatry

## 2016-09-17 DIAGNOSIS — M2012 Hallux valgus (acquired), left foot: Secondary | ICD-10-CM | POA: Diagnosis not present

## 2016-09-17 DIAGNOSIS — M2041 Other hammer toe(s) (acquired), right foot: Secondary | ICD-10-CM

## 2016-09-17 DIAGNOSIS — B351 Tinea unguium: Secondary | ICD-10-CM

## 2016-09-17 DIAGNOSIS — M2042 Other hammer toe(s) (acquired), left foot: Secondary | ICD-10-CM | POA: Diagnosis not present

## 2016-09-17 DIAGNOSIS — M2011 Hallux valgus (acquired), right foot: Secondary | ICD-10-CM

## 2016-09-17 NOTE — Progress Notes (Signed)
   Subjective:    Patient ID: Stephanie Parks, female    DOB: Aug 05, 1945, 71 y.o.   MRN: [id number]  HPI  71 year old female presents the also concerns of bunions to both of her feet as well as hammertoes. She states the right side is worse than left and she gets a discomfort along the right bunion. This has been ongoing for several months. She denies any recent injury or trauma. She states it hurts worse with closed in shoes. Denies any swelling or redness. Notingling. She has no other concerns today.  Review of Systems  All other systems reviewed and are negative.      Objective:   Physical Exam General: AAO x3, NAD  Dermatological: Nails appear to be somewhat discolored with yellow to brown discoloration as well as some darkened areas. There is no extension of any hyperpigmentation into the swimming skin appears to be localized only to the toenail. Is no ascending redness or drainage or any clinical signs of infection present. There are no open sores, no preulcerative lesions, no rash or signs of infection present.  Vascular: Dorsalis Pedis artery and Posterior Tibial artery pedal pulses are 2/4 bilateral with immedate capillary fill time.  There is no pain with calf compression, swelling, warmth, erythema.   Neruologic: Grossly intact via light touch bilateral. Vibratory intact via tuning fork bilateral. Protective threshold with Semmes Wienstein monofilament intact to all pedal sites bilateral.  Musculoskeletal: Moderate HAV is present bilaterally as well as hammertoes the lesser digits on digits 2 through 5 bilaterally. There is no area of tenderness along the biceps of the hammertoes today but subjectively on palpation of the medial right bunion the silver she gets discomfort. There is no overlying edema, erythema, increase in warmth. MMT 5/5, ROM WNL.   Assessment: 71 year old female with symptomatic HAV/Hammertoes, onychomycosis   Plan: -Treatment options discussed including all  alternatives, risks, and complications -Etiology of symptoms were discussed -Discussed both conservative and surgical treatment. She signed insertion and she will start with this. Discussed that she cannot modifications, inserts, offloading padding. Anti-inflammatory if needed for pain. If symptoms worsen consider steroid injection but she has no pain today.  -Offloading pads were dispensed for the bunion today.  -Discussion and options for nail fungus. She'll start with Fungi-Nail. -Follow-up as needed.   Celesta Gentile, DPM

## 2016-09-17 NOTE — Patient Instructions (Addendum)
You can get fungi-nail at the drug store for the toenail fungus -------  Bunion A bunion is a bump on the base of the big toe that forms when the bones of the big toe joint move out of position. Bunions may be small at first, but they often get larger over time. The can make walking painful. What are the causes? A bunion may be caused by:  Wearing narrow or pointed shoes that force the big toe to press against the other toes.  Abnormal foot development that causes the foot to roll inward (pronate).  Changes in the foot that are caused by certain diseases, such as rheumatoid arthritis and polio.  A foot injury.  What increases the risk? The following factors may make you more likely to develop this condition:  Wearing shoes that squeeze the toes together.  Having certain diseases, such as: ? Rheumatoid arthritis. ? Polio. ? Cerebral palsy.  Having family members who have bunions.  Being born with a foot deformity, such as flat feet or low arches.  Doing activities that put a lot of pressure on the feet, such as ballet dancing.  What are the signs or symptoms? The main symptom of a bunion is a noticeable bump on the big toe. Other symptoms may include:  Pain.  Swelling around the big toe.  Redness and inflammation.  Thick or hardened skin on the big toe or between the toes.  Stiffness or loss of motion in the big toe.  Trouble with walking.  How is this diagnosed? A bunion may be diagnosed based on your symptoms, medical history, and activities. You may have tests, such as:  X-rays. These allow your health care provider to check the position of the bones in your foot and look for damage to your joint. They also help your health care provider to determine the severity of your bunion and the best way to treat it.  Joint aspiration. In this test, a sample of fluid is removed from the toe joint. This test, which may be done if you are in a lot of pain, helps to rule out  diseases that cause painful swelling of the joints, such as arthritis.  How is this treated? There is no cure for a bunion, but treatment can help to prevent a bunion from getting worse. Treatment depends on the severity of your symptoms. Your health care provider may recommend:  Wearing shoes that have a wide toe box.  Using bunion pads to cushion the affected area.  Taping your toes together to keep them in a normal position.  Placing a device inside your shoe (orthotics) to help reduce pressure on your toe joint.  Taking medicine to ease pain, inflammation, and swelling.  Applying heat or ice to the affected area.  Doing stretching exercises.  Surgery to remove scar tissue and move the toes back into their normal position. This treatment is rare.  Follow these instructions at home:  Support your toe joint with proper footwear, shoe padding, or taping as told by your health care provider.  Take over-the-counter and prescription medicines only as told by your health care provider.  If directed, apply ice to the injured area: ? Put ice in a plastic bag. ? Place a towel between your skin and the bag. ? Leave the ice on for 20 minutes, 2-3 times per day.  If directed, apply heat to the affected area before you exercise. Use the heat source that your health care provider recommends, such as  a moist heat pack or a heating pad. ? Place a towel between your skin and the heat source. ? Leave the heat on for 20-30 minutes. ? Remove the heat if your skin turns bright red. This is especially important if you are unable to feel pain, heat, or cold. You may have a greater risk of getting burned.  Do exercises as told by your health care provider.  Keep all follow-up visits as told by your health care provider. Contact a health care provider if:  Your symptoms get worse.  Your symptoms do not improve in 2 weeks. Get help right away if:  You have severe pain and trouble with  walking. This information is not intended to replace advice given to you by your health care provider. Make sure you discuss any questions you have with your health care provider. Document Released: 02/04/2005 Document Revised: 07/13/2015 Document Reviewed: 09/04/2014 Elsevier Interactive Patient Education  2018 [place] Toe Hammer toe is a change in the shape (a deformity) of your second, third, or fourth toe. The deformity causes the middle joint of your toe to stay bent. This causes pain, especially when you are wearing shoes. Hammer toe starts gradually. At first, the toe can be straightened. Gradually over time, the deformity becomes stiff and permanent. Early treatments to keep the toe straight may relieve pain. As the deformity becomes stiff and permanent, surgery may be needed to straighten the toe. What are the causes? Hammer toe is caused by abnormal bending of the toe joint that is closest to your foot. It happens gradually over time. This pulls on the muscles and connections (tendons) of the toe joint, making them weak and stiff. It is often related to wearing shoes that are too short or narrow and do not let your toes straighten. What increases the risk? You may be at greater risk for hammer toe if you:  Are female.  Are older.  Wear shoes that are too small.  Wear high-heeled shoes that pinch your toes.  Are a Engineer, mining.  Have a second toe that is longer than your big toe (first toe).  Injure your foot or toe.  Have arthritis.  Have a family history of hammer toe.  Have a nerve or muscle disorder.  What are the signs or symptoms? The main symptoms of this condition are pain and deformity of the toe. The pain is worse when wearing shoes, walking, or running. Other symptoms may include:  Corns or calluses over the bent part of the toe or between the toes.  Redness and a burning feeling on the toe.  An open sore that forms on the top of the  toe.  Not being able to straighten the toe.  How is this diagnosed? This condition is diagnosed based on your symptoms and a physical exam. During the exam, your health care provider will try to straighten your toe to see how stiff the deformity is. You may also have tests, such as:  A blood test to check for rheumatoid arthritis.  An X-ray to show how severe the deformity is.  How is this treated? Treatment for this condition will depend on how stiff the deformity is. Surgery is often needed. However, sometimes a hammer toe can be straightened without surgery. Treatments that do not involve surgery include:  Taping the toe into a straightened position.  Using pads and cushions to protect the toe (orthotics).  Wearing shoes that provide enough room for the toes.  Doing toe-stretching exercises at home.  Taking an NSAID to reduce pain and swelling.  If these treatments do not help or the toe cannot be straightened, surgery is the next option. The most common surgeries used to straighten a hammer toe include:  Arthroplasty. In this procedure, part of the joint is removed, and that allows the toe to straighten.  Fusion. In this procedure, cartilage between the two bones of the joint is taken out and the bones are fused together into one longer bone.  Implantation. In this procedure, part of the bone is removed and replaced with an implant to let the toe move again.  Flexor tendon transfer. In this procedure, the tendons that curl the toes down (flexor tendons) are repositioned.  Follow these instructions at home:  Take over-the-counter and prescription medicines only as told by your health care provider.  Do toe straightening and stretching exercises as told by your health care provider.  Keep all follow-up visits as told by your health care provider. This is important. How is this prevented?  Wear shoes that give your toes enough room and do not cause pain.  Do not wear  high-heeled shoes. Contact a health care provider if:  Your pain gets worse.  Your toe becomes red or swollen.  You develop an open sore on your toe. This information is not intended to replace advice given to you by your health care provider. Make sure you discuss any questions you have with your health care provider. Document Released: 02/02/2000 Document Revised: 08/25/2015 Document Reviewed: 05/31/2015 Elsevier Interactive Patient Education  [employer].

## 2016-09-18 ENCOUNTER — Other Ambulatory Visit: Payer: Self-pay | Admitting: Family Medicine

## 2016-09-18 ENCOUNTER — Telehealth: Payer: Self-pay | Admitting: Family Medicine

## 2016-09-18 DIAGNOSIS — M8580 Other specified disorders of bone density and structure, unspecified site: Secondary | ICD-10-CM

## 2016-09-18 NOTE — Telephone Encounter (Signed)
Pt returning call

## 2016-09-20 NOTE — Telephone Encounter (Signed)
Notified pt and she is agreeable to recommendations. Lab appt scheduled for 09/26/16 at 11am and future order has been placed.

## 2016-09-20 NOTE — Telephone Encounter (Signed)
Attempted to reach pt re: DEXA result and left message for pt to return my call.   Notes recorded by Mackie Pai, PA-C on 09/13/2016 at 7:52 AM EDT Pt has osteopenia on her dexa scan. Will rx fosamax to help maintain bone density. Advise calcium 1200 mg a day. Will put in order for vitamin D as well. Lab will be placed as future. She can be put on lab schedule and just come in for that this coming week.

## 2016-09-20 NOTE — Telephone Encounter (Signed)
Patient returning call.

## 2016-09-26 ENCOUNTER — Other Ambulatory Visit (INDEPENDENT_AMBULATORY_CARE_PROVIDER_SITE_OTHER): Payer: Medicare Other

## 2016-09-26 DIAGNOSIS — M8580 Other specified disorders of bone density and structure, unspecified site: Secondary | ICD-10-CM | POA: Diagnosis not present

## 2016-09-26 LAB — VITAMIN D 25 HYDROXY (VIT D DEFICIENCY, FRACTURES): VITD: 36.7 ng/mL (ref 30.00–100.00)

## 2016-10-01 NOTE — Progress Notes (Signed)
MT VIAL MADE EXP. 10-02-17

## 2016-10-03 DIAGNOSIS — J3089 Other allergic rhinitis: Secondary | ICD-10-CM | POA: Diagnosis not present

## 2016-10-11 ENCOUNTER — Ambulatory Visit (INDEPENDENT_AMBULATORY_CARE_PROVIDER_SITE_OTHER): Payer: Medicare Other

## 2016-10-11 DIAGNOSIS — J309 Allergic rhinitis, unspecified: Secondary | ICD-10-CM | POA: Diagnosis not present

## 2016-10-11 NOTE — Progress Notes (Signed)
Immunotherapy   Patient Details  Name: VANNIE HILGERT MRN: [id number] Date of Birth: 1946/02/03  10/11/2016  [name]  HERE to pick up red mt vial Following schedule: c  Frequency:once a week until 0.5 then every 4 weeks Epi-Pen:yes Consent signed and patient instructions given.   Felipa Emory 10/11/2016, 9:57 AM

## 2016-11-19 NOTE — Progress Notes (Addendum)
[facility] [address] [place], [place] [address] [phone]  Date:  11/20/2016   Name:  Stephanie Parks   DOB:  [birth year]   MRN:  [id number]  PCP:  [name], MD    Chief Complaint: Leg Pain (thighs left and right)   History of Present Illness:  Stephanie Parks is a 72 y.o. very pleasant female patient who presents with the following:  History of CAD, HTN. I last saw her in July Lab Results  Component Value Date   HGBA1C 5.9 05/01/2016   Here today with concern of pain in her left lateral thigh- feels like a burning and stinging pain She also has similar sx in the right anterior leg She has noted this off and on for about 2 months She also describes a numb feeling in the same distrubution when she has the pain sx She notes occasional back pain- however this has not been persistent or changed at all   The numbness/ stinging may occur 3-4 times a day and last about 15 seconds  It is most often noticeable in the left, sometimes the right as well  She has been told that she has a bulging disc in her back from when she fell in 2017 She did have an MRI in January of this year;  IMPRESSION: 1. Fifth first of all please note that I am assuming that the L5 vertebra is sacralized based on numbering of ribs and lumbar vertebra. 2. Lumbar spondylosis and degenerative disc disease causing moderate impingement at T12-L1, L3-4, and L4-5 ; and mild impingement at L1- 2, as detailed above. The impingement at L3-4 and L4-5 is mildly worsened compared to prior exam.  She has had back surgery- most recently about 20 years ago.  She is not quite sure what operation she had or what level was operated on  She has tried some tylenol that just helps temporarily No bowel or bladder incontinence She has noted urinary frequency for about 2 weeks, but no dysuria, no hematuria No more recent fall or injury   Flu  shot: declines  Pneumonia vaccine:  Labs are UTD for the year   She does not have DM She does stand on her feet for many hours each day- she work at [employer] and does stand for 8 hours a day  Patient Active Problem List   Diagnosis Date Noted  . 3-vessel CAD 03/01/2015  . Aneurysm ([place]) 03/01/2015  . Dizziness 03/01/2015  . Hypercholesterolemia 03/01/2015  . Decreased potassium in the blood 03/01/2015  . Paresthesia of arm 03/01/2015  . Allergic rhinitis due to pollen 03/01/2015  . Achalasia 09/28/2012  . CN (constipation) 09/28/2012  . Cephalalgia 08/24/2012  . HTN (hypertension) 04/19/2011  . Hyperlipidemia 04/19/2011  . GERD (gastroesophageal reflux disease) 04/19/2011  . Chest pain 04/04/2011    Past Medical History:  Diagnosis Date  . GERD (gastroesophageal reflux disease)   . Hiatal hernia   . Hyperlipidemia   . Hypertension   . Migraine headache     Past Surgical History:  Procedure Laterality Date  . ABDOMINAL HYSTERECTOMY    . BACK SURGERY     x 3  . CARPAL TUNNEL RELEASE     left wrist  . CERVICAL SPINE SURGERY    . HEMORRHOID SURGERY      Social History  Substance Use Topics  . Smoking status: Former Smoker    Packs/day: 0.50  Years: 20.00    Types: Cigarettes    Quit date: 02/19/1984  . Smokeless tobacco: Never Used  . Alcohol use No    Family History  Problem Relation Age of Onset  . Allergic rhinitis Sister   . Diabetes Sister   . Hypertension Sister   . Heart attack Father 20  . Heart disease Father   . Stomach cancer Maternal Grandmother   . Heart disease Mother   . Colon cancer Neg Hx   . Angioedema Neg Hx   . Asthma Neg Hx   . Eczema Neg Hx   . Urticaria Neg Hx   . Immunodeficiency Neg Hx   . Breast cancer Neg Hx     Allergies  Allergen Reactions  . Shellfish Allergy Hives and Swelling  . Ace Inhibitors Other (See Comments)  . Pitavastatin   . Shellfish-Derived Products Other (See Comments)    Hives Hives  .  Sulfamethoxazole Other (See Comments)    Hives  . Sulfa Antibiotics Other (See Comments) and Rash    Fine bumps    Medication list has been reviewed and updated.  Current Outpatient Prescriptions on File Prior to Visit  Medication Sig Dispense Refill  . alendronate (FOSAMAX) 10 MG tablet Take 1 tablet (10 mg total) by mouth daily before breakfast. Take with a full glass of water on an empty stomach. 30 tablet 11  . aspirin 81 MG tablet Take 81 mg by mouth daily.    . benzonatate (TESSALON) 100 MG capsule Take 1 capsule (100 mg total) by mouth 3 (three) times daily as needed for cough. 60 capsule 0  . calcium carbonate (OS-CAL) 600 MG tablet Take 1 tablet (600 mg total) by mouth 2 (two) times daily with a meal. 60 tablet 0  . fluticasone (FLONASE) 50 MCG/ACT nasal spray USE 2 SPRAYS IN NOSTRIL(S) DAILY AS NEEDED.  3  . losartan-hydrochlorothiazide (HYZAAR) 50-12.5 MG tablet Take 1 tablet by mouth daily. 90 tablet 3  . lovastatin (MEVACOR) 20 MG tablet Take 20 mg by mouth.    . Multiple Vitamins-Minerals (MULTIVITAL) tablet Take by mouth.    . NONFORMULARY OR COMPOUNDED ITEM Allergen immunotherapy (Patient taking differently: Allergy shots (new vial: shot given weekly for four weeks) Then once every four weeks.) 2 each 1  . nortriptyline (PAMELOR) 25 MG capsule Take 1 capsule (25 mg total) by mouth at bedtime. 90 capsule 3  . omeprazole (PRILOSEC) 20 MG capsule Take 20 mg by mouth daily.    . potassium chloride SA (KLOR-CON M20) 20 MEQ tablet Take 1 tablet (20 mEq total) by mouth daily. 90 tablet 3  . [DISCONTINUED] esomeprazole (NEXIUM) 40 MG capsule Take 1 capsule (40 mg total) by mouth daily. 30 capsule 3   No current facility-administered medications on file prior to visit.     Review of Systems:  As per HPI- otherwise negative. No fever or chills No CP or SOB No nausea, vomiting or diarrhea No bowel or bladder incontinence   BP Readings from Last 3 Encounters:  11/20/16 (!)  117/56  08/26/16 (!) 111/56  08/15/16 (!) 118/58      Physical Examination: Vitals:   11/20/16 1535  BP: (!) 117/56  Pulse: 89  Temp: 98.3 F (36.8 C)  SpO2: 100%   Vitals:   11/20/16 1535  Weight: 203 lb (92.1 kg)  Height: 5\' 6"  (1.676 m)   Body mass index is 32.77 kg/m. Ideal Body Weight: Weight in (lb) to have BMI = 25: 154.6  GEN: WDWN, NAD, Non-toxic, A & O x 3, overweight, otherwise looks well HEENT: Atraumatic, Normocephalic. Neck supple. No masses, No LAD.  Bilateral TM wnl, oropharynx normal.  PEERL,EOMI.   Ears and Nose: No external deformity. CV: RRR, No M/G/R. No JVD. No thrill. No extra heart sounds. PULM: CTA B, no wheezes, crackles, rhonchi. No retractions. No resp. distress. No accessory muscle use. ABD: S, NT, ND, +BS. No rebound. No HSM. EXTR: No c/c/e NEURO Normal gait.  PSYCH: Normally interactive. Conversant. Not depressed or anxious appearing.  Calm demeanor.  Normal BLE strength, sensation and DTR She has slightly restricted lumbar flexion She has superficial spider veins on both legs- otherwise no redness, swelling, or tenderness Her sensation is normal at this time   Assessment and Plan: Urinary frequency - Plan: Urine Culture, POCT urinalysis dipstick  Paresthesia of bilateral legs - Plan: predniSONE (DELTASONE) 20 MG tablet  Here today with unusual paresthesias of both legs for a few weeks Suspect this may have to do with a nerve root irritation as she has known spine disease Will treat with a prednisone taper for 8 days She will let me know if not helpful for her Possible UTI- await culture results   Signed Lamar Blinks, MD  Received her urine culture 10/5 and gave her a call- urine culture is negative, LMOM.  Please let me know if not better!   Results for orders placed or performed in visit on 11/20/16  Urine Culture  Result Value Ref Range   MICRO NUMBER: [id number]    SPECIMEN QUALITY: ADEQUATE    Sample Source NOT GIVEN     STATUS: FINAL    Result:      Three or more organisms present, each greater than 10,000 cu/mL. May represent normal flora contamination from external genitalia. No further testing is required.  POCT urinalysis dipstick  Result Value Ref Range   Color, UA yellow yellow   Clarity, UA cloudy (A) clear   Glucose, UA negative negative mg/dL   Bilirubin, UA negative negative   Ketones, POC UA negative negative mg/dL   Spec Grav, UA 1.015 1.010 - 1.025   Blood, UA negative negative   pH, UA 7.0 5.0 - 8.0   Protein Ur, POC negative negative mg/dL   Urobilinogen, UA 0.2 0.2 or 1.0 E.U./dL   Nitrite, UA Negative Negative   Leukocytes, UA Trace (A) Negative

## 2016-11-20 ENCOUNTER — Ambulatory Visit (INDEPENDENT_AMBULATORY_CARE_PROVIDER_SITE_OTHER): Payer: Medicare Other | Admitting: Family Medicine

## 2016-11-20 VITALS — BP 117/56 | HR 89 | Temp 98.3°F | Ht 66.0 in | Wt 203.0 lb

## 2016-11-20 DIAGNOSIS — R202 Paresthesia of skin: Secondary | ICD-10-CM

## 2016-11-20 DIAGNOSIS — R350 Frequency of micturition: Secondary | ICD-10-CM

## 2016-11-20 LAB — POCT URINALYSIS DIP (MANUAL ENTRY)
BILIRUBIN UA: NEGATIVE mg/dL
Bilirubin, UA: NEGATIVE
Blood, UA: NEGATIVE
GLUCOSE UA: NEGATIVE mg/dL
NITRITE UA: NEGATIVE
Protein Ur, POC: NEGATIVE mg/dL
SPEC GRAV UA: 1.015 (ref 1.010–1.025)
UROBILINOGEN UA: 0.2 U/dL
pH, UA: 7 (ref 5.0–8.0)

## 2016-11-20 MED ORDER — PREDNISONE 20 MG PO TABS: ORAL_TABLET | ORAL | 0 refills | Status: DC

## 2016-11-20 NOTE — Patient Instructions (Addendum)
It was nice to see you today as always!  Take care and please let me know if the prednisone does or does not work in getting rid of your problem.  I will be in touch with your urine culture in the next 2 days or so- we will see if you do have a UTI perhaps

## 2016-11-21 LAB — URINE CULTURE
MICRO NUMBER:: 81098400
SPECIMEN QUALITY:: ADEQUATE

## 2016-11-26 ENCOUNTER — Telehealth: Payer: Self-pay | Admitting: Family Medicine

## 2016-11-26 NOTE — Telephone Encounter (Signed)
Larenda Reedy Self [phone]  Nadirah called to say that the medicine she was given is not working. Please call and advise.

## 2016-11-27 NOTE — Telephone Encounter (Signed)
Called but did not reach- will try her back later today

## 2016-11-29 NOTE — Telephone Encounter (Signed)
Called pt- I saw her for back pain and burning/ numbness in her leg on 10/3.  She was treated with prednisone but sx persist.  Her MRI in January did show lower spine impingement.  Spoke with Dr. Alphonzo Dublin nurse and she will call her to schedule an appt

## 2016-12-10 DIAGNOSIS — I10 Essential (primary) hypertension: Secondary | ICD-10-CM | POA: Diagnosis not present

## 2016-12-10 DIAGNOSIS — M5441 Lumbago with sciatica, right side: Secondary | ICD-10-CM | POA: Diagnosis not present

## 2016-12-10 DIAGNOSIS — I671 Cerebral aneurysm, nonruptured: Secondary | ICD-10-CM | POA: Diagnosis not present

## 2016-12-18 NOTE — Progress Notes (Signed)
[place] at [facility] [address] [place], [place] [address] [phone]  Date:  12/19/2016   Name:  Stephanie Parks   DOB:  Aug 16, 1945   MRN:  [id number]  PCP:  [name], MD    Chief Complaint: Follow-up (Pt here for 6 month f/u visit. Declined flu and pneumonia vaccine today. Wil consider tetanus vaccine at a later date. )   History of Present Illness:  Stephanie Parks is a 71 y.o. very pleasant female patient who presents with the following:  6 month follow-up visit today History of HTN, hyperlipidemia, CAD At our last visit on 10/3 she was noted to have LE paresthesias, and we tried a course of prednisone for her However this did not seem to help, so I called her NSG (Dr. Christella Noa) and scheduled a follow-up for her.   She is here today with sickness- she become sick on Monday- today is Thursday. She tried some home remedies, but yesterday she began feeling worse She notes body aches, chills She has not noted a fever at home She does have a cough No GI symptoms Her throat is a bit sore but this is not the main problem She took a tylenol yesterday, nothing today as of yet  Tetanus: Pneumonia: declined at last visit XNA:[id number] at last visit Labs: some done in July  She brings with her a letter from her DDS- on recent panorex films he was concerned about a possible abnormal calcification in the left neck, just under the mandible.    We will get mandible films today to check on this  Patient Active Problem List   Diagnosis Date Noted  . 3-vessel CAD 03/01/2015  . Aneurysm ([place]) 03/01/2015  . Dizziness 03/01/2015  . Hypercholesterolemia 03/01/2015  . Decreased potassium in the blood 03/01/2015  . Paresthesia of arm 03/01/2015  . Allergic rhinitis due to pollen 03/01/2015  . Achalasia 09/28/2012  . CN (constipation) 09/28/2012  . Cephalalgia 08/24/2012  . HTN (hypertension) 04/19/2011  . Hyperlipidemia  04/19/2011  . GERD (gastroesophageal reflux disease) 04/19/2011  . Chest pain 04/04/2011    Past Medical History:  Diagnosis Date  . GERD (gastroesophageal reflux disease)   . Hiatal hernia   . Hyperlipidemia   . Hypertension   . Migraine headache     Past Surgical History:  Procedure Laterality Date  . ABDOMINAL HYSTERECTOMY    . BACK SURGERY     x 3  . CARPAL TUNNEL RELEASE     left wrist  . CERVICAL SPINE SURGERY    . HEMORRHOID SURGERY      Social History  Substance Use Topics  . Smoking status: Former Smoker    Packs/day: 0.50    Years: 20.00    Types: Cigarettes    Quit date: 02/19/1984  . Smokeless tobacco: Never Used  . Alcohol use No    Family History  Problem Relation Age of Onset  . Allergic rhinitis Sister   . Diabetes Sister   . Hypertension Sister   . Heart attack Father 64  . Heart disease Father   . Stomach cancer Maternal Grandmother   . Heart disease Mother   . Colon cancer Neg Hx   . Angioedema Neg Hx   . Asthma Neg Hx   . Eczema Neg Hx   . Urticaria Neg Hx   . Immunodeficiency Neg Hx   . Breast cancer Neg Hx     Allergies  Allergen Reactions  . Shellfish Allergy Hives and Swelling  . Ace Inhibitors Other (See Comments)  . Pitavastatin   . Shellfish-Derived Products Other (See Comments)    Hives Hives  . Sulfamethoxazole Other (See Comments)    Hives  . Sulfa Antibiotics Other (See Comments) and Rash    Fine bumps    Medication list has been reviewed and updated.  Current Outpatient Prescriptions on File Prior to Visit  Medication Sig Dispense Refill  . alendronate (FOSAMAX) 10 MG tablet Take 1 tablet (10 mg total) by mouth daily before breakfast. Take with a full glass of water on an empty stomach. 30 tablet 11  . aspirin 81 MG tablet Take 81 mg by mouth daily.    . calcium carbonate (OS-CAL) 600 MG tablet Take 1 tablet (600 mg total) by mouth 2 (two) times daily with a meal. 60 tablet 0  . fluticasone (FLONASE) 50 MCG/ACT  nasal spray USE 2 SPRAYS IN NOSTRIL(S) DAILY AS NEEDED.  3  . losartan-hydrochlorothiazide (HYZAAR) 50-12.5 MG tablet Take 1 tablet by mouth daily. 90 tablet 3  . lovastatin (MEVACOR) 20 MG tablet Take 20 mg by mouth.    . Multiple Vitamins-Minerals (MULTIVITAL) tablet Take by mouth.    . NONFORMULARY OR COMPOUNDED ITEM Allergen immunotherapy (Patient taking differently: Allergy shots (new vial: shot given weekly for four weeks) Then once every four weeks.) 2 each 1  . nortriptyline (PAMELOR) 25 MG capsule Take 1 capsule (25 mg total) by mouth at bedtime. 90 capsule 3  . omeprazole (PRILOSEC) 20 MG capsule Take 20 mg by mouth daily.    . potassium chloride SA (KLOR-CON M20) 20 MEQ tablet Take 1 tablet (20 mEq total) by mouth daily. 90 tablet 3  . [DISCONTINUED] esomeprazole (NEXIUM) 40 MG capsule Take 1 capsule (40 mg total) by mouth daily. 30 capsule 3   No current facility-administered medications on file prior to visit.     Review of Systems:  As per HPI- otherwise negative. No rash  Physical Examination: Vitals:   12/19/16 1005  BP: 130/62  Pulse: 90  Temp: 98.5 F (36.9 C)  SpO2: 98%   Vitals:   12/19/16 1005  Weight: 207 lb 3.2 oz (94 kg)  Height: 5\' 6"  (1.676 m)   Body mass index is 33.44 kg/m. Ideal Body Weight: Weight in (lb) to have BMI = 25: 154.6  GEN: WDWN, NAD, Non-toxic, A & O x 3, obese.   HEENT: Atraumatic, Normocephalic. Neck supple. No masses, No LAD.   Bilateral TM wnl, oropharynx normal.  PEERL,EOMI.   Ears and Nose: No external deformity. CV: RRR, No M/G/R. No JVD. No thrill. No extra heart sounds. PULM: CTA B, no wheezes, crackles, rhonchi. No retractions. No resp. distress. No accessory muscle use. ABD: S, NT, ND, +BS. No rebound. No HSM. EXTR: No c/c/e NEURO Normal gait.  PSYCH: Normally interactive. Conversant. Not depressed or anxious appearing.  Calm demeanor.   Results for orders placed or performed in visit on 12/19/16  POCT Influenza A/B   Result Value Ref Range   Influenza A, POC Negative Negative   Influenza B, POC Negative Negative     Assessment and Plan: Influenza - Plan: oseltamivir (TAMIFLU) 75 MG capsule  Malaise - Plan: POCT Influenza A/B  Cough - Plan: POCT Influenza A/B  Fatigue, unspecified type - Plan: POCT Influenza A/B  Calcification of joint - Plan: DG Mandible 4 Views  Here today with likely flu- negative test but her sx are consistent  with likely influenza Will treat with tamiflu Encouraged rest, fluids, OTC medications as needed- she will let me know if not feeling better in the next couple of days- Sooner if worse.   Will check mandible films today for her as well  Meds ordered this encounter  Medications  . oseltamivir (TAMIFLU) 75 MG capsule    Sig: Take 1 capsule (75 mg total) by mouth 2 (two) times daily.    Dispense:  10 capsule    Refill:  0     Signed Lamar Blinks, MD  Dg Mandible 4 Views  Result Date: 12/19/2016 CLINICAL DATA:  Abnormal cataracts at dentist's office. Abnormal calcification. EXAM: MANDIBLE - 4+ VIEW COMPARISON:  CTA of the head and neck 06/15/2016 FINDINGS: There is no evidence of fracture or other focal bone lesions. Cervical spine fusion is noted. Multiple metallic fillings are noted. IMPRESSION: Negative mandible radiographs. Electronically Signed   By: San Morelle M.D.   On: 12/19/2016 13:43   Normal mandible films- message to pt, all looks ok

## 2016-12-19 ENCOUNTER — Ambulatory Visit (INDEPENDENT_AMBULATORY_CARE_PROVIDER_SITE_OTHER): Payer: Medicare Other | Admitting: Family Medicine

## 2016-12-19 ENCOUNTER — Ambulatory Visit (HOSPITAL_BASED_OUTPATIENT_CLINIC_OR_DEPARTMENT_OTHER)
Admission: RE | Admit: 2016-12-19 | Discharge: 2016-12-19 | Disposition: A | Discharge status: Home / Self Care | Payer: Medicare Other | Source: Ambulatory Visit | Attending: Family Medicine | Admitting: Family Medicine

## 2016-12-19 VITALS — BP 130/62 | HR 90 | Temp 98.5°F | Ht 66.0 in | Wt 207.2 lb

## 2016-12-19 DIAGNOSIS — R05 Cough: Secondary | ICD-10-CM

## 2016-12-19 DIAGNOSIS — R5381 Other malaise: Secondary | ICD-10-CM

## 2016-12-19 DIAGNOSIS — M2580 Other specified joint disorders, unspecified joint: Secondary | ICD-10-CM

## 2016-12-19 DIAGNOSIS — J111 Influenza due to unidentified influenza virus with other respiratory manifestations: Secondary | ICD-10-CM

## 2016-12-19 DIAGNOSIS — R51 Headache: Secondary | ICD-10-CM | POA: Diagnosis not present

## 2016-12-19 DIAGNOSIS — R059 Cough, unspecified: Secondary | ICD-10-CM

## 2016-12-19 DIAGNOSIS — R5383 Other fatigue: Secondary | ICD-10-CM

## 2016-12-19 LAB — POCT INFLUENZA A/B
INFLUENZA A, POC: NEGATIVE
Influenza B, POC: NEGATIVE

## 2016-12-19 MED ORDER — OSELTAMIVIR PHOSPHATE 75 MG PO CAPS: 75.0000 mg | ORAL_CAPSULE | Freq: Two times a day (BID) | ORAL | 0 refills | Status: DC

## 2016-12-19 NOTE — Patient Instructions (Addendum)
I am sorry you are not feeling well!  We are going to treat you for flu with tamilu twice a day for 5 days for presumed flu Rest, drink plenty of liquids If you are not feeling better in the next few days please let me know You can continue OTC medications as needed for your symptoms

## 2016-12-24 ENCOUNTER — Ambulatory Visit (INDEPENDENT_AMBULATORY_CARE_PROVIDER_SITE_OTHER): Payer: Medicare Other | Admitting: Internal Medicine

## 2016-12-24 ENCOUNTER — Encounter: Payer: Self-pay | Admitting: Internal Medicine

## 2016-12-24 ENCOUNTER — Ambulatory Visit: Payer: Medicare Other | Admitting: Internal Medicine

## 2016-12-24 VITALS — BP 126/74 | HR 76 | Temp 97.9°F | Resp 14 | Ht 66.0 in | Wt 208.0 lb

## 2016-12-24 DIAGNOSIS — J111 Influenza due to unidentified influenza virus with other respiratory manifestations: Secondary | ICD-10-CM | POA: Diagnosis not present

## 2016-12-24 MED ORDER — DOXYCYCLINE HYCLATE 100 MG PO CAPS: 100.0000 mg | ORAL_CAPSULE | Freq: Two times a day (BID) | ORAL | 0 refills | Status: DC

## 2016-12-24 MED ORDER — AZELASTINE HCL 0.1 % NA SOLN: 2.0000 | Freq: Every evening | NASAL | 3 refills | Status: DC | PRN

## 2016-12-24 NOTE — Patient Instructions (Signed)
Rest, fluids , tylenol  For cough:  Take Mucinex DM twice a day as needed until better  For nasal congestion: Use OTC  Flonase : 2 nasal sprays on each side of the nose in the morning until you feel better Use ASTELIN a prescribed spray : 2 nasal sprays on each side of the nose at night until you feel better   Avoid decongestants such as  Pseudoephedrine or phenylephrine   Take the antibiotic as prescribed  (doxycycline) only if no better in 3-4 days   Call if not gradually better over the next  10 days  Call anytime if the symptoms are severe

## 2016-12-24 NOTE — Progress Notes (Signed)
Pre visit review using our clinic review tool, if applicable. No additional management support is needed unless otherwise documented below in the visit note. 

## 2016-12-24 NOTE — Progress Notes (Signed)
Subjective:    Patient ID: Stephanie Parks, female    DOB: 04/07/1945, 71 y.o.   MRN: [id number]  DOS:  12/24/2016 Type of visit - description :  Interval history: Patient was seen on 12/19/2016, was treated with Tamiflu for influenza-like illness. She is here because although feels better still having a lot of symptoms: Cough, mild clear sputum production.  No hemoptysis Abundant nasal discharge, clear, + sinus congestion.   Review of Systems  Denies fevers No nausea or vomiting No unusual aches No headache or rash. No history of asthma COPD  Past Medical History:  Diagnosis Date  . GERD (gastroesophageal reflux disease)   . Hiatal hernia   . Hyperlipidemia   . Hypertension   . Migraine headache     Past Surgical History:  Procedure Laterality Date  . ABDOMINAL HYSTERECTOMY    . BACK SURGERY     x 3  . CARPAL TUNNEL RELEASE     left wrist  . CERVICAL SPINE SURGERY    . HEMORRHOID SURGERY      Social History   Socioeconomic History  . Marital status: Divorced    Spouse name: Not on file  . Number of children: 2  . Years of education: Not on file  . Highest education level: Not on file  Social Needs  . Financial resource strain: Not on file  . Food insecurity - worry: Not on file  . Food insecurity - inability: Not on file  . Transportation needs - medical: Not on file  . Transportation needs - non-medical: Not on file  Occupational History  . Occupation: Engineer, mining: [employer]  Tobacco Use  . Smoking status: Former Smoker    Packs/day: 0.50    Years: 20.00    Pack years: 10.00    Types: Cigarettes    Last attempt to quit: 02/19/1984    Years since quitting: 32.8  . Smokeless tobacco: Never Used  Substance and Sexual Activity  . Alcohol use: No  . Drug use: No  . Sexual activity: Yes  Other Topics Concern  . Not on file  Social History Narrative  . Not on file      Allergies as of 12/24/2016      Reactions   Shellfish Allergy  Hives, Swelling   Ace Inhibitors Other (See Comments)   Pitavastatin    Sulfa Antibiotics Other (See Comments), Rash   Fine bumps      Medication List        Accurate as of 12/24/16 11:59 PM. Always use your most recent med list.          alendronate 10 MG tablet Commonly known as:  FOSAMAX Take 1 tablet (10 mg total) by mouth daily before breakfast. Take with a full glass of water on an empty stomach.   aspirin 81 MG tablet Take 81 mg by mouth daily.   azelastine 0.1 % nasal spray Commonly known as:  ASTELIN Place 2 sprays at bedtime as needed into both nostrils for rhinitis. Use in each nostril as directed   calcium carbonate 600 MG tablet Commonly known as:  OS-CAL Take 1 tablet (600 mg total) by mouth 2 (two) times daily with a meal.   doxycycline 100 MG capsule Commonly known as:  VIBRAMYCIN Take 1 capsule (100 mg total) 2 (two) times daily by mouth.   fluticasone 50 MCG/ACT nasal spray Commonly known as:  FLONASE USE 2 SPRAYS IN NOSTRIL(S) DAILY AS NEEDED.  losartan-hydrochlorothiazide 50-12.5 MG tablet Commonly known as:  HYZAAR Take 1 tablet by mouth daily.   lovastatin 20 MG tablet Commonly known as:  MEVACOR Take 20 mg by mouth.   MULTIVITAL tablet Take by mouth.   NONFORMULARY OR COMPOUNDED ITEM Allergen immunotherapy   nortriptyline 25 MG capsule Commonly known as:  PAMELOR Take 1 capsule (25 mg total) by mouth at bedtime.   omeprazole 20 MG capsule Commonly known as:  PRILOSEC Take 20 mg by mouth daily.   potassium chloride SA 20 MEQ tablet Commonly known as:  KLOR-CON M20 Take 1 tablet (20 mEq total) by mouth daily.          Objective:   Physical Exam BP 126/74 (BP Location: Left Arm, Patient Position: Sitting, Cuff Size: Small)   Pulse 76   Temp 97.9 F (36.6 C) (Oral)   Resp 14   Ht 5\' 6"  (1.676 m)   Wt 208 lb (94.3 kg)   SpO2 93%   BMI 33.57 kg/m  General:   Well developed, well nourished . NAD.  HEENT:  Normocephalic  . Face symmetric, atraumatic; nose quite congested; sunuses not TTP Throat not red, symmetric  Lungs:  Few ronchi w/ cough B, no crackles. Normal respiratory effort, no intercostal retractions, no accessory muscle use. Heart: RRR,  no murmur.  No pretibial edema bilaterally  Skin: Not pale. Not jaundice Neurologic:  alert & oriented X3.  Speech normal, gait appropriate for age and unassisted Psych--  Cognition and judgment appear intact.  Cooperative with normal attention span and concentration.  Behavior appropriate. No anxious or depressed appearing.      Assessment & Plan:    71 year old lady with history of hyperlipidemia, hypertension, h/o HAs on pamelor x > 10 years per pt, GERD, migraine headaches, mild CAD by cath 2003, 2013 c/o  CP> saw cards, negative treadmill stress . Also saw cardiology 06/2015, they noted a cardiac catheterization 2009 with nonobstructive heart disease, echo showed a normal EF, had left arm weakness at the time, no further evaluation was recommended. Presents with the following:  Influenza: Diagnosed clinically few days ago, s/p Tamiflu, feeling a little better but has persistent cough.  On exam she does not look toxic, no red flag symptoms, some rhonchi. Has not been taking any OTCs. Recommend: Mucinex DM, Flonase, Astelin, start doxycycline in few days if not better for presumed bronchitis.  Call anytime is symptoms to be.  See AVS

## 2017-01-13 ENCOUNTER — Ambulatory Visit (INDEPENDENT_AMBULATORY_CARE_PROVIDER_SITE_OTHER): Payer: Medicare Other | Admitting: Family Medicine

## 2017-01-13 ENCOUNTER — Encounter: Payer: Self-pay | Admitting: Family Medicine

## 2017-01-13 VITALS — BP 138/80 | HR 88 | Temp 97.8°F | Ht 65.0 in | Wt 209.0 lb

## 2017-01-13 DIAGNOSIS — M79652 Pain in left thigh: Secondary | ICD-10-CM

## 2017-01-13 DIAGNOSIS — M79651 Pain in right thigh: Secondary | ICD-10-CM | POA: Diagnosis not present

## 2017-01-13 MED ORDER — GABAPENTIN 300 MG PO CAPS: 300.0000 mg | ORAL_CAPSULE | Freq: Three times a day (TID) | ORAL | 3 refills | Status: DC

## 2017-01-13 NOTE — Progress Notes (Signed)
[place] at [facility] [address], [place], [place] [address] [phone]  Date:  01/13/2017   Name:  Stephanie Parks   DOB:  [birth year]   MRN:  [id number]  PCP:  [name], MD    Chief Complaint: Leg Pain (Pt states she's been having pain in her thighs for 3 to 4 months and nothing seems to be helping. States pain is spreading upward )   History of Present Illness:  Stephanie Parks is a 71 y.o. very pleasant female patient who presents with the following:  I saw her with the flu at the start of this month  She notes that she is having pain in her legs and groin. She had complained of this back in October and we did a course of prednisone for her. She does not really recall if this helped or not.    She is not having back pain at this time except occasionally  The pain/ burning does not go into her lower legs or feet- it is present in her bilateral thighs, superior/ anterior aspect.   Her position does not affect her pain.  The pain seems to be equal in both legs She has not noted any unusual bowel or bladder control issues except for her usual age related urinary urgency. However she does not have stress incontinence   Besides prednisone we have not tried any meds for this. She did try some tylenol last night which helped her some  when she had back issues in the past she had back pain, not leg pain   She does see Dr. Cyndy Freeze and visited with him in the lat couple of months- he did not think this has to do with her back per her report  She is on nortriptyline at bedtime to prevent HA     Chemistry      Component Value Date/Time   NA 139 08/26/2016 1337   NA 144 08/30/2015   K 3.5 08/26/2016 1337   CL 100 08/26/2016 1337   CO2 33 (H) 08/26/2016 1337   BUN 22 08/26/2016 1337   BUN 13 08/30/2015   CREATININE 1.02 08/26/2016 1337   GLU 91 08/30/2015      Component Value Date/Time   CALCIUM 9.8 08/26/2016 1337    ALKPHOS 65 05/01/2016 1310   AST 16 05/01/2016 1310   ALT 12 05/01/2016 1310   BILITOT 0.3 05/01/2016 1310       Patient Active Problem List   Diagnosis Date Noted  . 3-vessel CAD 03/01/2015  . Aneurysm ([place]) 03/01/2015  . Dizziness 03/01/2015  . Hypercholesterolemia 03/01/2015  . Decreased potassium in the blood 03/01/2015  . Paresthesia of arm 03/01/2015  . Allergic rhinitis due to pollen 03/01/2015  . Achalasia 09/28/2012  . CN (constipation) 09/28/2012  . Cephalalgia 08/24/2012  . HTN (hypertension) 04/19/2011  . Hyperlipidemia 04/19/2011  . GERD (gastroesophageal reflux disease) 04/19/2011  . Chest pain 04/04/2011    Past Medical History:  Diagnosis Date  . GERD (gastroesophageal reflux disease)   . Hiatal hernia   . Hyperlipidemia   . Hypertension   . Migraine headache     Past Surgical History:  Procedure Laterality Date  . ABDOMINAL HYSTERECTOMY    . BACK SURGERY     x 3  . CARPAL TUNNEL RELEASE     left wrist  . CERVICAL SPINE SURGERY    . HEMORRHOID SURGERY  Social History   Tobacco Use  . Smoking status: Former Smoker    Packs/day: 0.50    Years: 20.00    Pack years: 10.00    Types: Cigarettes    Last attempt to quit: 02/19/1984    Years since quitting: 32.9  . Smokeless tobacco: Never Used  Substance Use Topics  . Alcohol use: No  . Drug use: No    Family History  Problem Relation Age of Onset  . Allergic rhinitis Sister   . Diabetes Sister   . Hypertension Sister   . Heart attack Father 68  . Heart disease Father   . Stomach cancer Maternal Grandmother   . Heart disease Mother   . Colon cancer Neg Hx   . Angioedema Neg Hx   . Asthma Neg Hx   . Eczema Neg Hx   . Urticaria Neg Hx   . Immunodeficiency Neg Hx   . Breast cancer Neg Hx     Allergies  Allergen Reactions  . Shellfish Allergy Hives and Swelling  . Ace Inhibitors Other (See Comments)  . Pitavastatin   . Sulfa Antibiotics Other (See Comments) and Rash     Fine bumps    Medication list has been reviewed and updated.  Current Outpatient Medications on File Prior to Visit  Medication Sig Dispense Refill  . alendronate (FOSAMAX) 10 MG tablet Take 1 tablet (10 mg total) by mouth daily before breakfast. Take with a full glass of water on an empty stomach. 30 tablet 11  . aspirin 81 MG tablet Take 81 mg by mouth daily.    Marland Kitchen azelastine (ASTELIN) 0.1 % nasal spray Place 2 sprays at bedtime as needed into both nostrils for rhinitis. Use in each nostril as directed 30 mL 3  . calcium carbonate (OS-CAL) 600 MG tablet Take 1 tablet (600 mg total) by mouth 2 (two) times daily with a meal. 60 tablet 0  . doxycycline (VIBRAMYCIN) 100 MG capsule Take 1 capsule (100 mg total) 2 (two) times daily by mouth. 14 capsule 0  . fluticasone (FLONASE) 50 MCG/ACT nasal spray USE 2 SPRAYS IN NOSTRIL(S) DAILY AS NEEDED.  3  . losartan-hydrochlorothiazide (HYZAAR) 50-12.5 MG tablet Take 1 tablet by mouth daily. 90 tablet 3  . lovastatin (MEVACOR) 20 MG tablet Take 20 mg by mouth.    . Multiple Vitamins-Minerals (MULTIVITAL) tablet Take by mouth.    . NONFORMULARY OR COMPOUNDED ITEM Allergen immunotherapy (Patient taking differently: Allergy shots (new vial: shot given weekly for four weeks) Then once every four weeks.) 2 each 1  . nortriptyline (PAMELOR) 25 MG capsule Take 1 capsule (25 mg total) by mouth at bedtime. 90 capsule 3  . omeprazole (PRILOSEC) 20 MG capsule Take 20 mg by mouth daily.    . potassium chloride SA (KLOR-CON M20) 20 MEQ tablet Take 1 tablet (20 mEq total) by mouth daily. 90 tablet 3  . [DISCONTINUED] esomeprazole (NEXIUM) 40 MG capsule Take 1 capsule (40 mg total) by mouth daily. 30 capsule 3   No current facility-administered medications on file prior to visit.     Review of Systems:  As per HPI- otherwise negative.   Physical Examination: Vitals:   01/13/17 1331  BP: 138/80  Pulse: 88  Temp: 97.8 F (36.6 C)   Vitals:   01/13/17 1331   Weight: 209 lb (94.8 kg)  Height: 5\' 5"  (1.651 m)   Body mass index is 34.78 kg/m. Ideal Body Weight: Weight in (lb) to have BMI = 25:  149.9  GEN: WDWN, NAD, Non-toxic, A & O x 3, obese, looks well HEENT: Atraumatic, Normocephalic. Neck supple. No masses, No LAD. Ears and Nose: No external deformity. CV: RRR, No M/G/R. No JVD. No thrill. No extra heart sounds. PULM: CTA B, no wheezes, crackles, rhonchi. No retractions. No resp. distress. No accessory muscle use. ABD: S, NT, ND, +BS. No rebound. No HSM.  Benign belly EXTR: No c/c/e NEURO Normal gait.  PSYCH: Normally interactive. Conversant. Not depressed or anxious appearing.  Calm demeanor.  Normal lumbar flexion and extension Scar over lumbar spine midline from operation  No acute back tenderness to palpation Normal BLE strength, DTR, SLR She notes the symptoms are distributed in both anterior thighs at about L2 level  Assessment and Plan: Bilateral thigh pain - Plan: gabapentin (NEURONTIN) 300 MG capsule, VAS [place] ABI WITH/WO TBI  Here today with pain/ burning in both anterior superior thighs.  She did have an abnormal MRI in January-  IMPRESSION: 1. Fifth first of all please note that I am assuming that the L5 vertebra is sacralized based on numbering of ribs and lumbar vertebra. 2. Lumbar spondylosis and degenerative disc disease causing moderate impingement at T12-L1, L3-4, and L4-5 ; and mild impingement at L1- 2, as detailed above. The impingement at L3-4 and L4-5 is mildly worsened compared to prior exam  However pt reports that her NSG does not feel that her spine disease is causing her leg symptoms Therefore, will try gabapentin, and will also set her up for ABI to evaluate circulation to her legs   She will update me regarding how the gabapentin works for her  Signed Lamar Blinks, MD

## 2017-01-13 NOTE — Patient Instructions (Signed)
I am sorry that you are having this leg pain- we are going to try and figure this out.  We will arrange for "ABI" testing to check your blood pressure in your legs Please start on the gabapentin 300 mg- once a day or 2 days, twice a day for 2 days, then three times a day.  Please let me know how this works for you over the next month or so

## 2017-02-05 ENCOUNTER — Inpatient Hospital Stay (HOSPITAL_COMMUNITY): Admission: RE | Admit: 2017-02-05 | Payer: Medicare Other | Source: Ambulatory Visit

## 2017-02-06 ENCOUNTER — Ambulatory Visit (HOSPITAL_COMMUNITY)
Admission: RE | Admit: 2017-02-06 | Discharge: 2017-02-06 | Disposition: A | Discharge status: Home / Self Care | Payer: Medicare Other | Source: Ambulatory Visit | Attending: Cardiology | Admitting: Cardiology

## 2017-02-06 DIAGNOSIS — M79604 Pain in right leg: Secondary | ICD-10-CM | POA: Diagnosis not present

## 2017-02-06 DIAGNOSIS — M79605 Pain in left leg: Secondary | ICD-10-CM | POA: Insufficient documentation

## 2017-02-06 DIAGNOSIS — M79652 Pain in left thigh: Secondary | ICD-10-CM

## 2017-02-06 DIAGNOSIS — M79651 Pain in right thigh: Secondary | ICD-10-CM

## 2017-02-09 ENCOUNTER — Encounter: Payer: Self-pay | Admitting: Family Medicine

## 2017-02-12 ENCOUNTER — Ambulatory Visit (INDEPENDENT_AMBULATORY_CARE_PROVIDER_SITE_OTHER): Payer: Medicare Other | Admitting: Internal Medicine

## 2017-02-12 ENCOUNTER — Encounter: Payer: Self-pay | Admitting: Internal Medicine

## 2017-02-12 VITALS — BP 133/69 | HR 87 | Temp 98.0°F | Resp 14 | Ht 65.0 in | Wt 211.1 lb

## 2017-02-12 DIAGNOSIS — B349 Viral infection, unspecified: Secondary | ICD-10-CM | POA: Diagnosis not present

## 2017-02-12 LAB — POCT INFLUENZA A/B
INFLUENZA A, POC: NEGATIVE
INFLUENZA B, POC: NEGATIVE

## 2017-02-12 MED ORDER — AMOXICILLIN 500 MG PO CAPS: 1000.0000 mg | ORAL_CAPSULE | Freq: Two times a day (BID) | ORAL | 0 refills | Status: DC

## 2017-02-12 NOTE — Patient Instructions (Addendum)
Rest, fluids , tylenol  For cough:  Take Mucinex DM twice a day as needed until better  For nasal congestion: Use OTC Nasocort or Flonase : 2 nasal sprays on each side of the nose in the morning until you feel better Use ASTELIN a prescribed spray : 2 nasal sprays on each side of the nose at night until you feel better   Avoid decongestants such as  Pseudoephedrine or phenylephrine    Take the antibiotic as prescribed  (Amoxicillin) only if no better in few days (sinusitis?)   Call if not gradually better over the next  10 days  Call anytime if the symptoms are severe

## 2017-02-12 NOTE — Progress Notes (Signed)
Pre visit review using our clinic review tool, if applicable. No additional management support is needed unless otherwise documented below in the visit note. 

## 2017-02-12 NOTE — Progress Notes (Signed)
Subjective:    Patient ID: Stephanie Parks, female    DOB: [birth year], 71 y.o.   MRN: [id number]  DOS:  02/12/2017 Type of visit - description : acute  Interval history: Symptoms started 2 days ago with sore throat, the next day she got worse and developed runny nose, aches (interestingly mostly at the right arm.  No neck pain per se or a rash there).  She developed severe sinus congestion. She works at [employer] and has been exposed to number of sick people.   Review of Systems No fever, some chills. No nausea or vomiting Has not taking any medications. No history of asthma or smoking.  Past Medical History:  Diagnosis Date  . GERD (gastroesophageal reflux disease)   . Hiatal hernia   . Hyperlipidemia   . Hypertension   . Migraine headache     Past Surgical History:  Procedure Laterality Date  . ABDOMINAL HYSTERECTOMY    . BACK SURGERY     x 3  . CARPAL TUNNEL RELEASE     left wrist  . CERVICAL SPINE SURGERY    . HEMORRHOID SURGERY      Social History   Socioeconomic History  . Marital status: Divorced    Spouse name: Not on file  . Number of children: 2  . Years of education: Not on file  . Highest education level: Not on file  Social Needs  . Financial resource strain: Not on file  . Food insecurity - worry: Not on file  . Food insecurity - inability: Not on file  . Transportation needs - medical: Not on file  . Transportation needs - non-medical: Not on file  Occupational History  . Occupation: Engineer, mining: [employer]  Tobacco Use  . Smoking status: Former Smoker    Packs/day: 0.50    Years: 20.00    Pack years: 10.00    Types: Cigarettes    Last attempt to quit: 02/19/1984    Years since quitting: 33.0  . Smokeless tobacco: Never Used  Substance and Sexual Activity  . Alcohol use: No  . Drug use: No  . Sexual activity: Yes  Other Topics Concern  . Not on file  Social History Narrative  . Not on file      Allergies as of  02/12/2017      Reactions   Shellfish Allergy Hives, Swelling   Ace Inhibitors Other (See Comments)   Pitavastatin    Sulfa Antibiotics Other (See Comments), Rash   Fine bumps      Medication List        Accurate as of 02/12/17 11:59 PM. Always use your most recent med list.          alendronate 10 MG tablet Commonly known as:  FOSAMAX Take 1 tablet (10 mg total) by mouth daily before breakfast. Take with a full glass of water on an empty stomach.   amoxicillin 500 MG capsule Commonly known as:  AMOXIL Take 2 capsules (1,000 mg total) by mouth 2 (two) times daily.   aspirin 81 MG tablet Take 81 mg by mouth daily.   azelastine 0.1 % nasal spray Commonly known as:  ASTELIN Place 2 sprays at bedtime as needed into both nostrils for rhinitis. Use in each nostril as directed   calcium carbonate 600 MG tablet Commonly known as:  OS-CAL Take 1 tablet (600 mg total) by mouth 2 (two) times daily with a meal.   doxycycline 100 MG capsule  Commonly known as:  VIBRAMYCIN Take 1 capsule (100 mg total) 2 (two) times daily by mouth.   fluticasone 50 MCG/ACT nasal spray Commonly known as:  FLONASE USE 2 SPRAYS IN NOSTRIL(S) DAILY AS NEEDED.   gabapentin 300 MG capsule Commonly known as:  NEURONTIN Take 1 capsule (300 mg total) by mouth 3 (three) times daily.   losartan-hydrochlorothiazide 50-12.5 MG tablet Commonly known as:  HYZAAR Take 1 tablet by mouth daily.   lovastatin 20 MG tablet Commonly known as:  MEVACOR Take 20 mg by mouth.   MULTIVITAL tablet Take by mouth.   NONFORMULARY OR COMPOUNDED ITEM Allergen immunotherapy   nortriptyline 25 MG capsule Commonly known as:  PAMELOR Take 1 capsule (25 mg total) by mouth at bedtime.   omeprazole 20 MG capsule Commonly known as:  PRILOSEC Take 20 mg by mouth daily.   potassium chloride SA 20 MEQ tablet Commonly known as:  KLOR-CON M20 Take 1 tablet (20 mEq total) by mouth daily.          Objective:    Physical Exam BP 133/69 (BP Location: Left Arm, Patient Position: Sitting, Cuff Size: Normal)   Pulse 87   Temp 98 F (36.7 C) (Oral)   Resp 14   Ht 5\' 5"  (1.651 m)   Wt 211 lb 2 oz (95.8 kg)   SpO2 99%   BMI 35.13 kg/m  General:   Well developed, well nourished . NAD.  HEENT:  Normocephalic . Face symmetric, atraumatic. Nose quite congested, throat symmetric, no red.  Sinuses non-TTP.  This is slightly bulge but no red. Lungs:  Rhonchi with cough, no wheezing. Normal respiratory effort, no intercostal retractions, no accessory muscle use. Heart: RRR,  no murmur.  No pretibial edema bilaterally  Skin: Not pale. Not jaundice Neurologic:  alert & oriented X3.  Speech normal, gait appropriate for age and unassisted Psych--  Cognition and judgment appear intact.  Cooperative with normal attention span and concentration.  Behavior appropriate. No anxious or depressed appearing.      Assessment & Plan:    71 year old lady with history of hyperlipidemia, hypertension, h/o HAs on pamelor x > 10 years per pt, GERD, migraine headaches, mild CAD by cath 2003, 2013 c/o  CP> saw cards, negative treadmill stress . Also saw cardiology 06/2015, they noted a cardiac catheterization 2009 with nonobstructive heart disease, echo showed a normal EF, had left arm weakness at the time, no further evaluation was recommended. Presents with the following:  Viral syndrome: Flu test neg, treat conservatively with fluids, Tylenol, Mucinex, Flonase, Astelin.  If not better start amoxicillin for possible sinusitis.

## 2017-02-23 ENCOUNTER — Other Ambulatory Visit: Payer: Self-pay | Admitting: Family Medicine

## 2017-03-04 NOTE — Progress Notes (Addendum)
[place] at [facility] [address], [place], [place] [address] [phone]  Date:  03/05/2017   Name:  Stephanie Parks   DOB:  25-Feb-1945   MRN:  [id number]  PCP:  [name], MD    Chief Complaint: No chief complaint on file.   History of Present Illness:  Stephanie Parks is a 72 y.o. very pleasant female patient who presents with the following:  History of CAD, high cholesterol, HTN.  I last saw her in November with bilateral thigh pain Here today with concern of blood in her stools She has noted this when she is constipated on occasion in the past, but this seems to be occurring more frequently Will be a pink, stringy discharge  She has noted this issue consistently for about a week. ' It may have started with a hard and painful stool She has seen blood in the toilet bowl and also on her TP No blood today however No blood in her urine No nosebleeds  Colonoscopy- 2013  She admits to some recent weight gain  Wt Readings from Last 3 Encounters:  03/05/17 215 lb (97.5 kg)  02/12/17 211 lb 2 oz (95.8 kg)  01/13/17 209 lb (94.8 kg)   She takes a baby aspirin but no other blood thinners  I did see her back in November when she had complaint of bilateral thigh pain,.  The cause has not been apparent.  She would like to see neurology for next step eval which is fine  Patient Active Problem List   Diagnosis Date Noted  . 3-vessel CAD 03/01/2015  . Aneurysm ([place]) 03/01/2015  . Dizziness 03/01/2015  . Hypercholesterolemia 03/01/2015  . Decreased potassium in the blood 03/01/2015  . Paresthesia of arm 03/01/2015  . Allergic rhinitis due to pollen 03/01/2015  . Achalasia 09/28/2012  . CN (constipation) 09/28/2012  . Cephalalgia 08/24/2012  . HTN (hypertension) 04/19/2011  . Hyperlipidemia 04/19/2011  . GERD (gastroesophageal reflux disease) 04/19/2011  . Chest pain 04/04/2011    Past Medical History:   Diagnosis Date  . GERD (gastroesophageal reflux disease)   . Hiatal hernia   . Hyperlipidemia   . Hypertension   . Migraine headache     Past Surgical History:  Procedure Laterality Date  . ABDOMINAL HYSTERECTOMY    . BACK SURGERY     x 3  . CARPAL TUNNEL RELEASE     left wrist  . CERVICAL SPINE SURGERY    . HEMORRHOID SURGERY      Social History   Tobacco Use  . Smoking status: Former Smoker    Packs/day: 0.50    Years: 20.00    Pack years: 10.00    Types: Cigarettes    Last attempt to quit: 02/19/1984    Years since quitting: 33.0  . Smokeless tobacco: Never Used  Substance Use Topics  . Alcohol use: No  . Drug use: No    Family History  Problem Relation Age of Onset  . Allergic rhinitis Sister   . Diabetes Sister   . Hypertension Sister   . Heart attack Father 8  . Heart disease Father   . Stomach cancer Maternal Grandmother   . Heart disease Mother   . Colon cancer Neg Hx   . Angioedema Neg Hx   . Asthma Neg Hx   . Eczema Neg Hx   . Urticaria Neg Hx   . Immunodeficiency Neg Hx   .  Breast cancer Neg Hx     Allergies  Allergen Reactions  . Shellfish Allergy Hives and Swelling  . Ace Inhibitors Other (See Comments)  . Pitavastatin   . Sulfa Antibiotics Other (See Comments) and Rash    Fine bumps    Medication list has been reviewed and updated.  Current Outpatient Medications on File Prior to Visit  Medication Sig Dispense Refill  . alendronate (FOSAMAX) 10 MG tablet Take 1 tablet (10 mg total) by mouth daily before breakfast. Take with a full glass of water on an empty stomach. 30 tablet 11  . amoxicillin (AMOXIL) 500 MG capsule Take 2 capsules (1,000 mg total) by mouth 2 (two) times daily. 28 capsule 0  . aspirin 81 MG tablet Take 81 mg by mouth daily.    Marland Kitchen azelastine (ASTELIN) 0.1 % nasal spray Place 2 sprays at bedtime as needed into both nostrils for rhinitis. Use in each nostril as directed 30 mL 3  . calcium carbonate (OS-CAL) 600 MG  tablet Take 1 tablet (600 mg total) by mouth 2 (two) times daily with a meal. 60 tablet 0  . doxycycline (VIBRAMYCIN) 100 MG capsule Take 1 capsule (100 mg total) 2 (two) times daily by mouth. (Patient not taking: Reported on 02/12/2017) 14 capsule 0  . fluticasone (FLONASE) 50 MCG/ACT nasal spray USE 2 SPRAYS IN NOSTRIL(S) DAILY AS NEEDED.  3  . gabapentin (NEURONTIN) 300 MG capsule Take 1 capsule (300 mg total) by mouth 3 (three) times daily. 90 capsule 3  . losartan-hydrochlorothiazide (HYZAAR) 50-12.5 MG tablet Take 1 tablet by mouth daily. 90 tablet 3  . lovastatin (MEVACOR) 20 MG tablet Take 20 mg by mouth.    . Multiple Vitamins-Minerals (MULTIVITAL) tablet Take by mouth.    . NONFORMULARY OR COMPOUNDED ITEM Allergen immunotherapy (Patient taking differently: Allergy shots (new vial: shot given weekly for four weeks) Then once every four weeks.) 2 each 1  . nortriptyline (PAMELOR) 25 MG capsule Take 1 capsule (25 mg total) by mouth at bedtime. 90 capsule 3  . omeprazole (PRILOSEC) 20 MG capsule Take 20 mg by mouth daily.    . potassium chloride SA (KLOR-CON M20) 20 MEQ tablet Take 1 tablet (20 mEq total) by mouth daily. 90 tablet 3  . [DISCONTINUED] esomeprazole (NEXIUM) 40 MG capsule Take 1 capsule (40 mg total) by mouth daily. 30 capsule 3   No current facility-administered medications on file prior to visit.     Review of Systems:  As per HPI- otherwise negative.  She did have an operation to remove hemorrhoids in the past   Physical Examination: Vitals:   03/05/17 1341  BP: 135/60  Pulse: 81  Resp: 16  Temp: 97.9 F (36.6 C)  SpO2: 97%   Vitals:   03/05/17 1341  Weight: 215 lb (97.5 kg)  Height: 5\' 6"  (1.676 m)   Body mass index is 34.7 kg/m. Ideal Body Weight: Weight in (lb) to have BMI = 25: 154.6  GEN: WDWN, NAD, Non-toxic, A & O x 3, obese, otherwise looks well HEENT: Atraumatic, Normocephalic. Neck supple. No masses, No LAD.  Bilateral TM wnl, oropharynx  normal.  PEERL,EOMI.   Ears and Nose: No external deformity. CV: RRR, No M/G/R. No JVD. No thrill. No extra heart sounds. PULM: CTA B, no wheezes, crackles, rhonchi. No retractions. No resp. distress. No accessory muscle use. ABD: S, NT, ND, +BS. No rebound. No HSM. EXTR: No c/c/e NEURO Normal gait.  PSYCH: Normally interactive. Conversant. Not depressed or  anxious appearing.  Calm demeanor. Anus is s/p hemorrhoidectomy of some type.  otherwise normal exam, no gross blood and heme negative stool     Assessment and Plan: Blood in stool - Plan: Ambulatory referral to Gastroenterology, Comprehensive metabolic panel, POCT Occult Blood Stool, CBC, Comprehensive metabolic panel, CANCELED: CBC, CANCELED: IFOBT POC (occult bld, rslt in office)  Bilateral thigh pain - Plan: Ambulatory referral to Neurology  Blood in stool- likely due to constipation and trauma, but her last colon is over 62 years old.  Will refer back to GI for a consultation Labs pending as above Referral to neurology for bilateral thigh pain of uncertain origin, normal vascular studies already   Signed Lamar Blinks, MD  Received her labs 1/18- message to pt Metabolic profile is ok You are always slightly anemic- this is a bit more pronounced than is normal for you.  We are having your see GI to ensure you are not slowly losing blood through the GI system.   Please see me in about 3 months to follow-up and take care!  Results for orders placed or performed in visit on 03/05/17  CBC  Result Value Ref Range   WBC 6.2 4.0 - 10.5 K/uL   RBC 3.75 (L) 3.87 - 5.11 Mil/uL   Platelets 327.0 150.0 - 400.0 K/uL   Hemoglobin 10.9 (L) 12.0 - 15.0 g/dL   HCT 33.0 (L) 36.0 - 46.0 %   MCV 88.2 78.0 - 100.0 fl   MCHC 33.1 30.0 - 36.0 g/dL   RDW 15.2 11.5 - 15.5 %  Comprehensive metabolic panel  Result Value Ref Range   Sodium 141 135 - 145 mEq/L   Potassium 4.1 3.5 - 5.1 mEq/L   Chloride 101 96 - 112 mEq/L   CO2 36 (H) 19 - 32  mEq/L   Glucose, Bld 80 70 - 99 mg/dL   BUN 15 6 - 23 mg/dL   Creatinine, Ser 0.80 0.40 - 1.20 mg/dL   Total Bilirubin 0.3 0.2 - 1.2 mg/dL   Alkaline Phosphatase 62 39 - 117 U/L   AST 18 0 - 37 U/L   ALT 13 0 - 35 U/L   Total Protein 6.4 6.0 - 8.3 g/dL   Albumin 3.5 3.5 - 5.2 g/dL   Calcium 9.2 8.4 - 10.5 mg/dL   GFR 90.83 >60.00 mL/min  POCT Occult Blood Stool  Result Value Ref Range   Fecal Occult Blood, POC Negative Negative   Card #1 Date     Card #2 Fecal Occult Blod, POC     Card #2 Date     Card #3 Fecal Occult Blood, POC     Card #3 Date

## 2017-03-05 ENCOUNTER — Encounter: Payer: Self-pay | Admitting: Family Medicine

## 2017-03-05 ENCOUNTER — Ambulatory Visit: Payer: Medicare Other | Admitting: Family Medicine

## 2017-03-05 VITALS — BP 135/60 | HR 81 | Temp 97.9°F | Resp 16 | Ht 66.0 in | Wt 215.0 lb

## 2017-03-05 DIAGNOSIS — M79651 Pain in right thigh: Secondary | ICD-10-CM | POA: Diagnosis not present

## 2017-03-05 DIAGNOSIS — K921 Melena: Secondary | ICD-10-CM

## 2017-03-05 DIAGNOSIS — M79652 Pain in left thigh: Secondary | ICD-10-CM | POA: Diagnosis not present

## 2017-03-05 LAB — HEMOCCULT GUIAC POC 1CARD (OFFICE): FECAL OCCULT BLD: NEGATIVE

## 2017-03-05 NOTE — Progress Notes (Signed)
Order(s) created erroneously. Erroneous order ID: [id number]  Order moved by: Kelly Splinter  Order move date/time: 03/05/2017 10:17 PM  Source Patient: [id number]  Source Contact: 03/05/2017  Destination Patient: [id number]  Destination Contact: 05/30/2016

## 2017-03-05 NOTE — Patient Instructions (Signed)
I am going to send you back to Dr. Hilarie Fredrickson- GI- to talk about the blood you have noticed in your stool We will get labs for you today.  Please try a stool softener such as docusate sodium for a few days to prevent hard stools  I will also have you see neurology for the pain in your legs   Please let me know if any change or worsening in your symptoms in the meantime

## 2017-03-06 LAB — COMPREHENSIVE METABOLIC PANEL
ALT: 13 U/L (ref 0–35)
AST: 18 U/L (ref 0–37)
Albumin: 3.5 g/dL (ref 3.5–5.2)
Alkaline Phosphatase: 62 U/L (ref 39–117)
BUN: 15 mg/dL (ref 6–23)
CHLORIDE: 101 meq/L (ref 96–112)
CO2: 36 meq/L — AB (ref 19–32)
Calcium: 9.2 mg/dL (ref 8.4–10.5)
Creatinine, Ser: 0.8 mg/dL (ref 0.40–1.20)
GFR: 90.83 mL/min (ref >60.00)
GLUCOSE: 80 mg/dL (ref 70–99)
Potassium: 4.1 mEq/L (ref 3.5–5.1)
SODIUM: 141 meq/L (ref 135–145)
Total Bilirubin: 0.3 mg/dL (ref 0.2–1.2)
Total Protein: 6.4 g/dL (ref 6.0–8.3)

## 2017-03-06 LAB — CBC
HEMATOCRIT: 33 % — AB (ref 36.0–46.0)
HEMOGLOBIN: 10.9 g/dL — AB (ref 12.0–15.0)
MCHC: 33.1 g/dL (ref 30.0–36.0)
MCV: 88.2 fl (ref 78.0–100.0)
Platelets: 327 10*3/uL (ref 150.0–400.0)
RBC: 3.75 Mil/uL — ABNORMAL LOW (ref 3.87–5.11)
RDW: 15.2 % (ref 11.5–15.5)
WBC: 6.2 10*3/uL (ref 4.0–10.5)

## 2017-03-07 ENCOUNTER — Encounter: Payer: Self-pay | Admitting: Family Medicine

## 2017-03-21 ENCOUNTER — Other Ambulatory Visit: Payer: Self-pay | Admitting: Family Medicine

## 2017-04-07 ENCOUNTER — Other Ambulatory Visit: Payer: Self-pay | Admitting: Emergency Medicine

## 2017-04-07 DIAGNOSIS — M79651 Pain in right thigh: Secondary | ICD-10-CM

## 2017-04-07 DIAGNOSIS — M79652 Pain in left thigh: Principal | ICD-10-CM

## 2017-04-07 MED ORDER — GABAPENTIN 300 MG PO CAPS: 300.0000 mg | ORAL_CAPSULE | Freq: Three times a day (TID) | ORAL | 3 refills | Status: DC

## 2017-04-07 NOTE — Telephone Encounter (Signed)
Received refill request for gabapentin 300 mg capsule. Last office visit 03/05/17 and last refill 01/13/17. Refill sent to pharmacy.

## 2017-04-15 DIAGNOSIS — J3089 Other allergic rhinitis: Secondary | ICD-10-CM | POA: Diagnosis not present

## 2017-04-15 NOTE — Progress Notes (Signed)
VIAL EXP 04-15-18

## 2017-04-21 ENCOUNTER — Other Ambulatory Visit: Payer: Self-pay | Admitting: Family Medicine

## 2017-04-21 NOTE — Telephone Encounter (Signed)
°  Pt is out of medication and has not had it for 2 days.

## 2017-04-22 ENCOUNTER — Ambulatory Visit (INDEPENDENT_AMBULATORY_CARE_PROVIDER_SITE_OTHER): Payer: Medicare Other

## 2017-04-22 DIAGNOSIS — J309 Allergic rhinitis, unspecified: Secondary | ICD-10-CM | POA: Diagnosis not present

## 2017-04-22 NOTE — Progress Notes (Signed)
Immunotherapy   Patient Details  Name: Stephanie Parks MRN: [id number] Date of Birth: 1945-11-13  04/22/2017  [name] here to pick up RED 1/100 MITE-CR-WEED @ 0.10 Following schedule: C  Frequency:1 time per week Epi-Pen:Epi-Pen Available  Consent signed and patient instructions given.   Loleta Dicker 04/22/2017, 11:08 AM

## 2017-04-24 ENCOUNTER — Encounter: Payer: Self-pay | Admitting: *Deleted

## 2017-04-24 ENCOUNTER — Ambulatory Visit: Payer: Medicare Other | Admitting: Allergy and Immunology

## 2017-04-24 VITALS — BP 120/66 | HR 96 | Temp 98.3°F | Resp 20 | Ht 64.0 in | Wt 213.0 lb

## 2017-04-24 DIAGNOSIS — R059 Cough, unspecified: Secondary | ICD-10-CM | POA: Insufficient documentation

## 2017-04-24 DIAGNOSIS — J3089 Other allergic rhinitis: Secondary | ICD-10-CM

## 2017-04-24 DIAGNOSIS — R05 Cough: Secondary | ICD-10-CM | POA: Diagnosis not present

## 2017-04-24 HISTORY — DX: Cough, unspecified: R05.9

## 2017-04-24 MED ORDER — AZELASTINE HCL 0.1 % NA SOLN: 2.0000 | Freq: Two times a day (BID) | NASAL | 5 refills | Status: DC | PRN

## 2017-04-24 NOTE — Assessment & Plan Note (Signed)
   Continue appropriate allergen avoidance measures and aeroallergen immunotherapy injections as prescribed and as tolerated.  A prescription has been provided for azelastine nasal spray, 1-2 sprays per nostril 2 times daily as needed. Proper nasal spray technique has been discussed and demonstrated.   Nasal saline lavage (NeilMed) has been recommended as needed and prior to medicated nasal sprays along with instructions for proper administration.  For thick post nasal drainage, add guaifenesin 600 mg (Mucinex)  twice daily as needed with adequate hydration as discussed.

## 2017-04-24 NOTE — Assessment & Plan Note (Signed)
The patient's history and physical examination suggest upper airway cough syndrome.  We will treatfor postnasal drainage and evaluate results.  Treatment plan as outlined above.  If the coughing persists or progresses despite this plan, we will evaluate further.

## 2017-04-24 NOTE — Progress Notes (Signed)
Follow-up Note  RE: Stephanie Parks MRN: [id number] DOB: 18-Aug-1945 Date of Office Visit: 04/24/2017  Primary care provider: [name], MD Referring provider: [name], MD  History of present illness: Stephanie Parks is a 72 y.o. Female with allergic rhinitis on immunotherapy presenting today for follow-up.  She was last seen in this clinic in January 2017 by Dr. Emilio Math, who has since left the practice.  She reports that she is receiving maintenance dose immunotherapy injections without problems or complications.  She has noted significant allergy symptom reduction while on immunotherapy.  However, she has noted that over the past 4 weeks she has been experiencing postnasal drainage, throat irritation, throat clearing, and coughing.  The cough feels as if it is originating as irritation at the base of her throat.  She does not complain of fevers, chills, discolored mucus production, dyspnea, chest tightness, or wheezing.  She is currently not taking any allergy related medications.   Assessment and plan: Allergic rhinitis  Continue appropriate allergen avoidance measures and aeroallergen immunotherapy injections as prescribed and as tolerated.  A prescription has been provided for azelastine nasal spray, 1-2 sprays per nostril 2 times daily as needed. Proper nasal spray technique has been discussed and demonstrated.   Nasal saline lavage (NeilMed) has been recommended as needed and prior to medicated nasal sprays along with instructions for proper administration.  For thick post nasal drainage, add guaifenesin 600 mg (Mucinex)  twice daily as needed with adequate hydration as discussed.  Coughing The patient's history and physical examination suggest upper airway cough syndrome.  We will treatfor postnasal drainage and evaluate results.  Treatment plan as outlined above.  If the coughing persists or progresses despite this plan, we will evaluate further.   Meds  ordered this encounter  Medications  . azelastine (ASTELIN) 0.1 % nasal spray    Sig: Place 2 sprays into both nostrils 2 (two) times daily as needed for rhinitis. Use in each nostril as directed    Dispense:  30 mL    Refill:  5    Physical examination: Blood pressure 120/66, pulse 96, temperature 98.3 F (36.8 C), temperature source Oral, resp. rate 20, height 5\' 4"  (1.626 m), weight 212 lb 15.4 oz (96.6 kg), SpO2 97 %.  General: Alert, interactive, in no acute distress. HEENT: TMs pearly gray, turbinates moderately edematous with thick discharge, post-pharynx moderately erythematous. Neck: Supple without lymphadenopathy. Lungs: Clear to auscultation without wheezing, rhonchi or rales. CV: Normal S1, S2 without murmurs. Skin: Warm and dry, without lesions or rashes.  The following portions of the patient's history were reviewed and updated as appropriate: allergies, current medications, past family history, past medical history, past social history, past surgical history and problem list.  Allergies as of 04/24/2017      Reactions   Shellfish Allergy Hives, Swelling   Ace Inhibitors Other (See Comments)   Pitavastatin    Sulfa Antibiotics Other (See Comments), Rash   Fine bumps      Medication List        Accurate as of 04/24/17 12:10 PM. Always use your most recent med list.          alendronate 10 MG tablet Commonly known as:  FOSAMAX Take 1 tablet (10 mg total) by mouth daily before breakfast. Take with a full glass of water on an empty stomach.   aspirin 81 MG tablet Take 81 mg by mouth daily.   azelastine 0.1 % nasal spray Commonly known as:  ASTELIN Place 2 sprays at bedtime as needed into both nostrils for rhinitis. Use in each nostril as directed   azelastine 0.1 % nasal spray Commonly known as:  ASTELIN Place 2 sprays into both nostrils 2 (two) times daily as needed for rhinitis. Use in each nostril as directed   calcium carbonate 600 MG tablet Commonly  known as:  OS-CAL Take 1 tablet (600 mg total) by mouth 2 (two) times daily with a meal.   fluticasone 50 MCG/ACT nasal spray Commonly known as:  FLONASE USE 2 SPRAYS IN NOSTRIL(S) DAILY AS NEEDED.   gabapentin 300 MG capsule Commonly known as:  NEURONTIN Take 1 capsule (300 mg total) by mouth 3 (three) times daily.   losartan-hydrochlorothiazide 50-12.5 MG tablet Commonly known as:  HYZAAR Take 1 tablet by mouth daily.   lovastatin 20 MG tablet Commonly known as:  MEVACOR Take 20 mg by mouth.   MULTIVITAL tablet Take by mouth.   NONFORMULARY OR COMPOUNDED ITEM Allergen immunotherapy   nortriptyline 25 MG capsule Commonly known as:  PAMELOR Take 1 capsule (25 mg total) by mouth at bedtime.   omeprazole 20 MG capsule Commonly known as:  PRILOSEC Take 20 mg by mouth daily.   potassium chloride SA 20 MEQ tablet Commonly known as:  KLOR-CON M20 Take 1 tablet (20 mEq total) by mouth daily.       Allergies  Allergen Reactions  . Shellfish Allergy Hives and Swelling  . Ace Inhibitors Other (See Comments)  . Pitavastatin   . Sulfa Antibiotics Other (See Comments) and Rash    Fine bumps    I appreciate the opportunity to take part in Gizelle's care. Please do not hesitate to contact me with questions.  Sincerely,   R. Edgar Frisk, MD

## 2017-04-24 NOTE — Patient Instructions (Addendum)
Allergic rhinitis  Continue appropriate allergen avoidance measures and aeroallergen immunotherapy injections as prescribed and as tolerated.  A prescription has been provided for azelastine nasal spray, 1-2 sprays per nostril 2 times daily as needed. Proper nasal spray technique has been discussed and demonstrated.   Nasal saline lavage (NeilMed) has been recommended as needed and prior to medicated nasal sprays along with instructions for proper administration.  For thick post nasal drainage, add guaifenesin 600 mg (Mucinex)  twice daily as needed with adequate hydration as discussed.  Coughing The patient's history and physical examination suggest upper airway cough syndrome.  We will treatfor postnasal drainage and evaluate results.  Treatment plan as outlined above.  If the coughing persists or progresses despite this plan, we will evaluate further.   Return in about 1 year (around 04/25/2018), or if symptoms worsen or fail to improve.

## 2017-05-07 ENCOUNTER — Ambulatory Visit: Payer: Medicare Other | Admitting: Neurology

## 2017-05-09 ENCOUNTER — Ambulatory Visit: Payer: Medicare Other | Admitting: Internal Medicine

## 2017-06-12 ENCOUNTER — Ambulatory Visit (INDEPENDENT_AMBULATORY_CARE_PROVIDER_SITE_OTHER): Payer: Medicare Other | Admitting: Family Medicine

## 2017-06-12 ENCOUNTER — Other Ambulatory Visit (HOSPITAL_COMMUNITY)
Admission: RE | Admit: 2017-06-12 | Discharge: 2017-06-12 | Disposition: A | Discharge status: Home / Self Care | Payer: Medicare Other | Source: Ambulatory Visit | Attending: Family Medicine | Admitting: Family Medicine

## 2017-06-12 ENCOUNTER — Encounter: Payer: Self-pay | Admitting: Family Medicine

## 2017-06-12 VITALS — BP 125/77 | HR 83 | Temp 98.2°F | Ht 65.0 in | Wt 215.8 lb

## 2017-06-12 DIAGNOSIS — I2510 Atherosclerotic heart disease of native coronary artery without angina pectoris: Secondary | ICD-10-CM | POA: Insufficient documentation

## 2017-06-12 DIAGNOSIS — E7800 Pure hypercholesterolemia, unspecified: Secondary | ICD-10-CM | POA: Diagnosis not present

## 2017-06-12 DIAGNOSIS — K219 Gastro-esophageal reflux disease without esophagitis: Secondary | ICD-10-CM | POA: Insufficient documentation

## 2017-06-12 DIAGNOSIS — E785 Hyperlipidemia, unspecified: Secondary | ICD-10-CM | POA: Insufficient documentation

## 2017-06-12 DIAGNOSIS — N898 Other specified noninflammatory disorders of vagina: Secondary | ICD-10-CM

## 2017-06-12 DIAGNOSIS — I10 Essential (primary) hypertension: Secondary | ICD-10-CM | POA: Diagnosis not present

## 2017-06-12 DIAGNOSIS — Z87891 Personal history of nicotine dependence: Secondary | ICD-10-CM | POA: Insufficient documentation

## 2017-06-12 DIAGNOSIS — G43909 Migraine, unspecified, not intractable, without status migrainosus: Secondary | ICD-10-CM | POA: Insufficient documentation

## 2017-06-12 MED ORDER — FLUCONAZOLE 150 MG PO TABS: 150.0000 mg | ORAL_TABLET | Freq: Once | ORAL | 0 refills | Status: AC

## 2017-06-12 NOTE — Patient Instructions (Signed)
Good to see you today-  I will be in touch with your swab results asap In the meantime we are going to treat you for likely yeast infection with diflucan Take 1 pill, then repeat a 2nd dose one week later if needed

## 2017-06-12 NOTE — Progress Notes (Addendum)
[place] [facility] [address] [place], [place] [address] [phone]  Date:  06/12/2017   Name:  SUKHMAN KOCHER   DOB:  1945/04/17   MRN:  [id number]  PCP:  [name], MD    Chief Complaint: Vaginal Itching (c/o vaginal odor and itching. )   History of Present Illness:  Stephanie Parks is a 72 y.o. very pleasant female patient who presents with the following:  Here today with concern of vaginal itching and odor which she has noted for 3 weeks or so No pain She will have some brown staining from her vagina in her underwear but otherwise no discharge.  She is s/p hysterectomy  I saw her for blood in her stool back in January and sent her to GI for evaluation- I don't se where she was seen as of yet   She hasn't been to see urology or GI yet due to her work schedule being so crazy- however this is getting a bit better so she should be able to arrange these specialist appts soon She is feeling very tired but we think this is due to her working 2nd shift some of the time, and alternating shifts  Same sexual partner for 7 years    Patient Active Problem List   Diagnosis Date Noted  . Coughing 04/24/2017  . 3-vessel CAD 03/01/2015  . Aneurysm ([place]) 03/01/2015  . Dizziness 03/01/2015  . Hypercholesterolemia 03/01/2015  . Decreased potassium in the blood 03/01/2015  . Paresthesia of arm 03/01/2015  . Allergic rhinitis 03/01/2015  . Achalasia 09/28/2012  . CN (constipation) 09/28/2012  . Cephalalgia 08/24/2012  . HTN (hypertension) 04/19/2011  . Hyperlipidemia 04/19/2011  . GERD (gastroesophageal reflux disease) 04/19/2011  . Chest pain 04/04/2011    Past Medical History:  Diagnosis Date  . GERD (gastroesophageal reflux disease)   . Hiatal hernia   . Hyperlipidemia   . Hypertension   . Migraine headache   . Schatzki's ring     Past Surgical History:  Procedure Laterality Date  . ABDOMINAL  HYSTERECTOMY    . BACK SURGERY     x 3  . CARPAL TUNNEL RELEASE     left wrist  . CERVICAL SPINE SURGERY    . HEMORRHOID SURGERY      Social History   Tobacco Use  . Smoking status: Former Smoker    Packs/day: 0.50    Years: 20.00    Pack years: 10.00    Types: Cigarettes    Last attempt to quit: 02/19/1984    Years since quitting: 33.3  . Smokeless tobacco: Never Used  Substance Use Topics  . Alcohol use: No  . Drug use: No    Family History  Problem Relation Age of Onset  . Allergic rhinitis Sister   . Diabetes Sister   . Hypertension Sister   . Heart attack Father 20  . Heart disease Father   . Stomach cancer Maternal Grandmother   . Heart disease Mother   . Colon cancer Neg Hx   . Angioedema Neg Hx   . Asthma Neg Hx   . Eczema Neg Hx   . Urticaria Neg Hx   . Immunodeficiency Neg Hx   . Breast cancer Neg Hx     Allergies  Allergen Reactions  . Shellfish Allergy Hives and Swelling  . Ace Inhibitors Other (See Comments)  . Pitavastatin   . Sulfa Antibiotics Other (See Comments)  and Rash    Fine bumps    Medication list has been reviewed and updated.  Current Outpatient Medications on File Prior to Visit  Medication Sig Dispense Refill  . alendronate (FOSAMAX) 10 MG tablet Take 1 tablet (10 mg total) by mouth daily before breakfast. Take with a full glass of water on an empty stomach. 30 tablet 11  . aspirin 81 MG tablet Take 81 mg by mouth daily.    Marland Kitchen azelastine (ASTELIN) 0.1 % nasal spray Place 2 sprays at bedtime as needed into both nostrils for rhinitis. Use in each nostril as directed 30 mL 3  . azelastine (ASTELIN) 0.1 % nasal spray Place 2 sprays into both nostrils 2 (two) times daily as needed for rhinitis. Use in each nostril as directed 30 mL 5  . calcium carbonate (OS-CAL) 600 MG tablet Take 1 tablet (600 mg total) by mouth 2 (two) times daily with a meal. 60 tablet 0  . fluticasone (FLONASE) 50 MCG/ACT nasal spray USE 2 SPRAYS IN NOSTRIL(S) DAILY  AS NEEDED.  3  . gabapentin (NEURONTIN) 300 MG capsule Take 1 capsule (300 mg total) by mouth 3 (three) times daily. (Patient not taking: Reported on 04/24/2017) 90 capsule 3  . losartan-hydrochlorothiazide (HYZAAR) 50-12.5 MG tablet Take 1 tablet by mouth daily. 90 tablet 3  . lovastatin (MEVACOR) 20 MG tablet Take 20 mg by mouth.    . Multiple Vitamins-Minerals (MULTIVITAL) tablet Take by mouth.    . NONFORMULARY OR COMPOUNDED ITEM Allergen immunotherapy (Patient taking differently: Allergy shots (new vial: shot given weekly for four weeks) Then once every four weeks.) 2 each 1  . nortriptyline (PAMELOR) 25 MG capsule Take 1 capsule (25 mg total) by mouth at bedtime. 90 capsule 1  . omeprazole (PRILOSEC) 20 MG capsule Take 20 mg by mouth daily.    . potassium chloride SA (KLOR-CON M20) 20 MEQ tablet Take 1 tablet (20 mEq total) by mouth daily. 90 tablet 3  . [DISCONTINUED] esomeprazole (NEXIUM) 40 MG capsule Take 1 capsule (40 mg total) by mouth daily. 30 capsule 3   No current facility-administered medications on file prior to visit.     Review of Systems:  As per HPI- otherwise negative. No fever or chills No nausea or vomiting   Physical Examination: Vitals:   06/12/17 1628  BP: 125/77  Pulse: 83  SpO2: 97%   Vitals:   06/12/17 1628  Weight: 215 lb 12.8 oz (97.9 kg)   Body mass index is 37.04 kg/m. Ideal Body Weight:    GEN: WDWN, NAD, Non-toxic, A & O x 3, obese, looks well  HEENT: Atraumatic, Normocephalic. Neck supple. No masses, No LAD. Ears and Nose: No external deformity. CV: RRR, No M/G/R. No JVD. No thrill. No extra heart sounds. PULM: CTA B, no wheezes, crackles, rhonchi. No retractions. No resp. distress. No accessory muscle use. ABD: S, NT, ND EXTR: No c/c/e NEURO Normal gait.  PSYCH: Normally interactive. Conversant. Not depressed or anxious appearing.  Calm demeanor.  S/p hysterectomy- normal exam. No discharge, lesions, or odor noted   Assessment and  Plan: Vaginal itching - Plan: Cervicovaginal ancillary only, fluconazole (DIFLUCAN) 150 MG tablet  Treat with diflucan for most likely yeast vaginitis multiswab pending as well - Will plan further follow- up pending labs.  Signed Lamar Blinks, MD  Received lab results 4/29- message to pt.  Does appear to be a yeast infection  Results for orders placed or performed in visit on 06/12/17  Cervicovaginal ancillary  only  Result Value Ref Range   Bacterial vaginitis Negative for Bacterial Vaginitis Microorganisms    Candida vaginitis **POSITIVE for Candida species** (A)    Trichomonas Negative

## 2017-06-16 ENCOUNTER — Encounter: Payer: Self-pay | Admitting: Family Medicine

## 2017-06-16 LAB — CERVICOVAGINAL ANCILLARY ONLY
Bacterial vaginitis: NEGATIVE
CANDIDA VAGINITIS: POSITIVE — AB
TRICH (WINDOWPATH): NEGATIVE

## 2017-06-17 ENCOUNTER — Other Ambulatory Visit: Payer: Self-pay | Admitting: Neurosurgery

## 2017-06-17 DIAGNOSIS — I671 Cerebral aneurysm, nonruptured: Secondary | ICD-10-CM

## 2017-06-23 ENCOUNTER — Ambulatory Visit
Admission: RE | Admit: 2017-06-23 | Discharge: 2017-06-23 | Disposition: A | Discharge status: Home / Self Care | Payer: Medicare Other | Source: Ambulatory Visit | Attending: Neurosurgery | Admitting: Neurosurgery

## 2017-06-23 DIAGNOSIS — I671 Cerebral aneurysm, nonruptured: Secondary | ICD-10-CM

## 2017-06-23 DIAGNOSIS — I607 Nontraumatic subarachnoid hemorrhage from unspecified intracranial artery: Secondary | ICD-10-CM | POA: Diagnosis not present

## 2017-06-23 MED ORDER — IOPAMIDOL (ISOVUE-370) INJECTION 76%
75.0000 mL | Freq: Once | INTRAVENOUS | Status: AC | PRN
  Administered 2017-06-23: 75 mL via INTRAVENOUS

## 2017-06-30 ENCOUNTER — Ambulatory Visit: Payer: Medicare Other | Admitting: Diagnostic Neuroimaging

## 2017-06-30 ENCOUNTER — Other Ambulatory Visit: Payer: Self-pay | Admitting: Family Medicine

## 2017-07-01 ENCOUNTER — Telehealth: Payer: Self-pay | Admitting: Neurology

## 2017-07-01 ENCOUNTER — Ambulatory Visit: Payer: Medicare Other | Admitting: Neurology

## 2017-07-01 ENCOUNTER — Encounter: Payer: Self-pay | Admitting: Neurology

## 2017-07-01 VITALS — BP 115/62 | HR 77 | Ht 64.0 in | Wt 215.5 lb

## 2017-07-01 DIAGNOSIS — M79605 Pain in left leg: Secondary | ICD-10-CM | POA: Diagnosis not present

## 2017-07-01 DIAGNOSIS — M79604 Pain in right leg: Secondary | ICD-10-CM

## 2017-07-01 DIAGNOSIS — M4802 Spinal stenosis, cervical region: Secondary | ICD-10-CM

## 2017-07-01 MED ORDER — GABAPENTIN 300 MG PO CAPS: 300.0000 mg | ORAL_CAPSULE | Freq: Every day | ORAL | 1 refills | Status: DC

## 2017-07-01 NOTE — Telephone Encounter (Signed)
[employer] Medicare order sent to GI. No auth they will reach out to the pt to schedule.  °

## 2017-07-01 NOTE — Patient Instructions (Signed)
   We will get MRI of the cervical spine.  We will get EMG and NCV study to look at the nerve function of the legs.  Start gabapentin 300 mg at night for the leg pain.  Neurontin (gabapentin) may result in drowsiness, ankle swelling, gait instability, or possibly dizziness. Please contact our office if significant side effects occur with this medication.

## 2017-07-01 NOTE — Progress Notes (Signed)
Reason for visit: Leg discomfort  Referring physician: Dr. Gates Rigg is a 72 y.o. female  History of present illness:  Stephanie Parks is a 82 year old right-handed black female with a history of chronic low back pain.  The patient is somewhat of a poor historian, it appears that she has been followed by Dr. Christella Noa from neurosurgery for her low back for several years, she has had prior cervical spine surgery.  The patient believes that she began having pain down both legs in the summer 2018.  The patient reports that this discomfort is intermittent in nature, it is not present at all times.  She has a tendency for having increased discomfort in the evening hours associated with burning sensations going down from the hip level to the ankles with tingling sensations and numbness.  The symptoms are equal from one side to the next, the patient believes that there is some slight weakness in the legs as well, she denies any balance problems.  She denies issues controlling the bladder, she occasionally will have some blood in the stools.  The patient denies any significant neck discomfort, she does have some shoulder discomfort at times.  The patient has had steroid injections in the back previously through Dr. Christella Noa.  She is on a cholesterol-lowering agent, Mevacor, but she indicates that she has been on this for 4 or 5 years without difficulty until just recently.  Most of the time during the day she feels normal without discomfort.  The discomfort may go into the groin area as well bilaterally.  She has had a recent CT angiogram of the head and neck, on the study they noted that she may have severe spinal stenosis at the C3-4 level.  Past Medical History:  Diagnosis Date  . GERD (gastroesophageal reflux disease)   . Hiatal hernia   . Hyperlipidemia   . Hypertension   . Migraine headache   . Schatzki's ring     Past Surgical History:  Procedure Laterality Date  . ABDOMINAL  HYSTERECTOMY    . BACK SURGERY     x 3  . CARPAL TUNNEL RELEASE     left wrist  . CERVICAL SPINE SURGERY    . HEMORRHOID SURGERY      Family History  Problem Relation Age of Onset  . Allergic rhinitis Sister   . Diabetes Sister   . Hypertension Sister   . Heart attack Father 4  . Heart disease Father   . Stomach cancer Maternal Grandmother   . Heart disease Mother   . Colon cancer Neg Hx   . Angioedema Neg Hx   . Asthma Neg Hx   . Eczema Neg Hx   . Urticaria Neg Hx   . Immunodeficiency Neg Hx   . Breast cancer Neg Hx     Social history:  reports that she quit smoking about 33 years ago. Her smoking use included cigarettes. She has a 10.00 pack-year smoking history. She has never used smokeless tobacco. She reports that she does not drink alcohol or use drugs.  Medications:  Prior to Admission medications   Medication Sig Start Date End Date Taking? Authorizing Provider  alendronate (FOSAMAX) 10 MG tablet Take 1 tablet (10 mg total) by mouth daily before breakfast. Take with a full glass of water on an empty stomach. 09/13/16  Yes Saguier, Percell Miller, PA-C  aspirin 81 MG tablet Take 81 mg by mouth daily.   Yes [provider]  azelastine (ASTELIN)  0.1 % nasal spray Place 2 sprays into both nostrils 2 (two) times daily as needed for rhinitis. Use in each nostril as directed 04/24/17  Yes Bobbitt, Sedalia Muta, MD  calcium carbonate (OS-CAL) 600 MG tablet Take 1 tablet (600 mg total) by mouth 2 (two) times daily with a meal. 09/20/16  Yes Saguier, Percell Miller, PA-C  fluticasone (FLONASE) 50 MCG/ACT nasal spray USE 2 SPRAYS IN NOSTRIL(S) DAILY AS NEEDED. 02/21/15  Yes [provider]  losartan-hydrochlorothiazide (HYZAAR) 50-12.5 MG tablet Take 1 tablet by mouth daily. 08/26/16  Yes Copland, Gay Filler, MD  lovastatin (MEVACOR) 20 MG tablet Take 20 mg by mouth.   Yes [provider]  Multiple Vitamins-Minerals (MULTIVITAL) tablet Take by mouth.   Yes [provider]  NONFORMULARY OR COMPOUNDED ITEM Allergen immunotherapy Patient taking differently: Allergy shots (new vial: shot given weekly for four weeks) Then once every four weeks. 03/01/15  Yes Leda Roys, MD  nortriptyline (PAMELOR) 25 MG capsule Take 1 capsule (25 mg total) by mouth at bedtime. 04/21/17  Yes Copland, Gay Filler, MD  omeprazole (PRILOSEC) 40 MG capsule Take 40 mg by mouth daily.   Yes [provider]  potassium chloride SA (KLOR-CON M20) 20 MEQ tablet Take 1 tablet (20 mEq total) by mouth daily. 08/26/16  Yes Copland, Gay Filler, MD  gabapentin (NEURONTIN) 300 MG capsule Take 1 capsule (300 mg total) by mouth at bedtime. 07/01/17   Kathrynn Ducking, MD      Allergies  Allergen Reactions  . Shellfish Allergy Hives and Swelling  . Ace Inhibitors Other (See Comments)  . Pitavastatin   . Sulfa Antibiotics Other (See Comments) and Rash    Fine bumps    ROS:  Out of a complete 14 system review of symptoms, the patient complains only of the following symptoms, and all other reviewed systems are negative.  Ringing in the ears Snoring Blood in the stool, constipation Feeling hot, cold, increased thirst Joint pain, aching muscles Allergies Numbness Not enough sleep, change in appetite  Blood pressure 115/62, pulse 77, height 5\' 4"  (1.626 m), weight 215 lb 8 oz (97.8 kg).  Physical Exam  General: The patient is alert and cooperative at the time of the examination.  The patient is moderately obese.  Eyes: Pupils are equal, round, and reactive to light. Discs are flat bilaterally.  Neck: The neck is supple, no carotid bruits are noted.  Respiratory: The respiratory examination is clear.  Cardiovascular: The cardiovascular examination reveals a regular rate and rhythm, no obvious murmurs or rubs are noted.  Neuromuscular: Range move the low back appears to be full.  Skin: Extremities are without significant edema.  Neurologic Exam  Mental status: The patient is  alert and oriented x 3 at the time of the examination. The patient has apparent normal recent and remote memory, with an apparently normal attention span and concentration ability.  Cranial nerves: Facial symmetry is present. There is good sensation of the face to pinprick and soft touch bilaterally. The strength of the facial muscles and the muscles to head turning and shoulder shrug are normal bilaterally. Speech is well enunciated, no aphasia or dysarthria is noted. Extraocular movements are full. Visual fields are full. The tongue is midline, and the patient has symmetric elevation of the soft palate. No obvious hearing deficits are noted.  Motor: The motor testing reveals 5 over 5 strength of all 4 extremities. Good symmetric motor tone is noted throughout.  Sensory: Sensory testing is intact  to pinprick, soft touch, vibration sensation, and position sense on all 4 extremities. No evidence of extinction is noted.  Coordination: Cerebellar testing reveals good finger-nose-finger and heel-to-shin bilaterally.  Gait and station: Gait is normal. Tandem gait is normal. Romberg is negative. No drift is seen.  Reflexes: Deep tendon reflexes are symmetric and normal bilaterally. Toes are downgoing bilaterally.   Assessment/Plan:  1.  Intermittent pain and dysesthesias of both lower extremities  2.  Possible severe spinal stenosis at the C3-4 level  The patient has a relatively unremarkable clinical examination.  Prior CT angiogram that was done picked up on what looks like severe spinal stenosis at the C3-4 level.  The patient will have MRI of the cervical spine.  She has undergone a prior lumbar MRI in 2018.  The patient will be sent for nerve conduction studies on both legs and EMG study on both legs.  The patient is on a cholesterol-lowering agent, if the above studies are normal, stopping the medication for 3 months may be reasonable to determine if this is the cause of her pain and discomfort.   The patient will be placed on gabapentin taking 300 mg in the evening.  The pain issue is truly intermittent in nature, not persistent.  She will follow-up otherwise in 4 months.  Jill Alexanders MD 07/01/2017 9:03 AM  [facility] [address] [place], [place] [address]  Phone [phone] Fax [phone]

## 2017-07-08 ENCOUNTER — Other Ambulatory Visit: Payer: Self-pay | Admitting: Family Medicine

## 2017-07-08 DIAGNOSIS — M79651 Pain in right thigh: Secondary | ICD-10-CM

## 2017-07-08 DIAGNOSIS — M79652 Pain in left thigh: Principal | ICD-10-CM

## 2017-07-12 ENCOUNTER — Other Ambulatory Visit: Payer: Medicare Other

## 2017-07-19 ENCOUNTER — Ambulatory Visit
Admission: RE | Admit: 2017-07-19 | Discharge: 2017-07-19 | Disposition: A | Discharge status: Home / Self Care | Payer: Medicare Other | Source: Ambulatory Visit | Attending: Neurology | Admitting: Neurology

## 2017-07-19 ENCOUNTER — Other Ambulatory Visit: Payer: Medicare Other

## 2017-07-19 DIAGNOSIS — M4802 Spinal stenosis, cervical region: Secondary | ICD-10-CM | POA: Diagnosis not present

## 2017-07-19 DIAGNOSIS — M5412 Radiculopathy, cervical region: Secondary | ICD-10-CM | POA: Diagnosis not present

## 2017-07-20 ENCOUNTER — Telehealth: Payer: Self-pay | Admitting: Neurology

## 2017-07-20 NOTE — Telephone Encounter (Signed)
I called the patient.  MRI of the cervical spine does show spinal stenosis at the C3-4 level, the spinal cord is getting deformed but there is no evidence of actual injury to the cord at that level.  The patient has been seen by Dr. Christella Noa previously. The patient will be coming in for EMG and nerve conduction study evaluation, we will discuss A possible reevaluation through Dr. Christella Noa at that time.     MRI cervical 07/20/17:  IMPRESSION: This MRI of the cervical spine without contrast shows the following: 1.   There is severe spinal stenosis at C3-C4 due to a moderate sized central disc herniation, uncovertebral spurring and ligamentum flavum hypertrophy.  There is no nerve root compression at this level.  The spinal cord appears to have normal signal at this level. 2.    At C2-C3, there is mild spinal stenosis but no nerve root compression. 3.    At C6-C7, there is moderate spinal stenosis due to a right paramedian disc herniation superimposed on disc bulging.  There is left greater than right foraminal narrowing.  There is no definite nerve root compression though there is some encroachment upon the left C7 nerve root 4.    Status post C4-C6 ACDF.  There is residual mild spinal stenosis at C5-C6 but no nerve root compression.

## 2017-07-24 DIAGNOSIS — M4802 Spinal stenosis, cervical region: Secondary | ICD-10-CM | POA: Diagnosis not present

## 2017-07-24 DIAGNOSIS — I671 Cerebral aneurysm, nonruptured: Secondary | ICD-10-CM | POA: Diagnosis not present

## 2017-07-28 ENCOUNTER — Ambulatory Visit: Payer: Medicare Other | Admitting: Internal Medicine

## 2017-07-30 NOTE — Progress Notes (Signed)
[place] [facility] [address], [place], [place] [address] [phone]  Date:  07/31/2017   Name:  Stephanie Parks   DOB:  1945-11-27   MRN:  [id number]  PCP:  [name], MD    Chief Complaint: Hernia (abdominal swelling, worse after eating); Ingrown Toenail (right great toe); and Arthritis (left hand pain, would like lab test)   History of Present Illness:  Stephanie Parks is a 72 y.o. very pleasant female patient who presents with the following:  Here today with concern of possible symptomatic hiatal hernia She recently saw Dr. Jannifer Franklin for LE pain:  Assessment/Plan: 1.  Intermittent pain and dysesthesias of both lower extremities 2.  Possible severe spinal stenosis at the C3-4 level The patient has a relatively unremarkable clinical examination.  Prior CT angiogram that was done picked up on what looks like severe spinal stenosis at the C3-4 level.  The patient will have MRI of the cervical spine.  She has undergone a prior lumbar MRI in 2018.  The patient will be sent for nerve conduction studies on both legs and EMG study on both legs.  The patient is on a cholesterol-lowering agent, if the above studies are normal, stopping the medication for 3 months may be reasonable to determine if this is the cause of her pain and discomfort.  The patient will be placed on gabapentin taking 300 mg in the evening.  The pain issue is truly intermittent in nature, not persistent  Pt notes that her belly seems to "swell up" after eating. Food is not getting stuck when she swallows, but she will cough after eating and may regurgitate food.  She has a known hiatal hernia She feels like her sx are worse the last couple of months than in the past No pain No vomiting  She has been taking omeprazole but does not fel like it is really helping  Her GI doctor- Pyrtle- saw her and did an upper GI and colon in 2013.  She is not having  typical gallbladder pain - no postprandial or RUQ pain   Her right great toenail seems to be ingrown to her. She has noted pain for the last couple of weeks Has not had issues with this issue in the past   Patient Active Problem List   Diagnosis Date Noted  . Coughing 04/24/2017  . 3-vessel CAD 03/01/2015  . Aneurysm ([place]) 03/01/2015  . Dizziness 03/01/2015  . Hypercholesterolemia 03/01/2015  . Decreased potassium in the blood 03/01/2015  . Paresthesia of arm 03/01/2015  . Allergic rhinitis 03/01/2015  . Achalasia 09/28/2012  . CN (constipation) 09/28/2012  . Cephalalgia 08/24/2012  . HTN (hypertension) 04/19/2011  . Hyperlipidemia 04/19/2011  . GERD (gastroesophageal reflux disease) 04/19/2011  . Chest pain 04/04/2011    Past Medical History:  Diagnosis Date  . GERD (gastroesophageal reflux disease)   . Hiatal hernia   . Hyperlipidemia   . Hypertension   . Migraine headache   . Schatzki's ring     Past Surgical History:  Procedure Laterality Date  . ABDOMINAL HYSTERECTOMY    . BACK SURGERY     x 3  . CARPAL TUNNEL RELEASE     left wrist  . CERVICAL SPINE SURGERY    . HEMORRHOID SURGERY      Social History   Tobacco Use  . Smoking status: Former Smoker    Packs/day: 0.50    Years: 20.00  Pack years: 10.00    Types: Cigarettes    Last attempt to quit: 02/19/1984    Years since quitting: 33.4  . Smokeless tobacco: Never Used  Substance Use Topics  . Alcohol use: No  . Drug use: No    Family History  Problem Relation Age of Onset  . Allergic rhinitis Sister   . Diabetes Sister   . Hypertension Sister   . Heart attack Father 73  . Heart disease Father   . Stomach cancer Maternal Grandmother   . Heart disease Mother   . Colon cancer Neg Hx   . Angioedema Neg Hx   . Asthma Neg Hx   . Eczema Neg Hx   . Urticaria Neg Hx   . Immunodeficiency Neg Hx   . Breast cancer Neg Hx     Allergies  Allergen Reactions  . Shellfish Allergy Hives and Swelling   . Ace Inhibitors Other (See Comments)  . Pitavastatin   . Sulfa Antibiotics Other (See Comments) and Rash    Fine bumps    Medication list has been reviewed and updated.  Current Outpatient Medications on File Prior to Visit  Medication Sig Dispense Refill  . alendronate (FOSAMAX) 10 MG tablet Take 1 tablet (10 mg total) by mouth daily before breakfast. Take with a full glass of water on an empty stomach. 30 tablet 11  . aspirin 81 MG tablet Take 81 mg by mouth daily.    Marland Kitchen azelastine (ASTELIN) 0.1 % nasal spray Place 2 sprays into both nostrils 2 (two) times daily as needed for rhinitis. Use in each nostril as directed 30 mL 5  . calcium carbonate (OS-CAL) 600 MG tablet Take 1 tablet (600 mg total) by mouth 2 (two) times daily with a meal. 60 tablet 0  . fluticasone (FLONASE) 50 MCG/ACT nasal spray USE 2 SPRAYS IN NOSTRIL(S) DAILY AS NEEDED.  3  . losartan-hydrochlorothiazide (HYZAAR) 50-12.5 MG tablet Take 1 tablet by mouth daily. 90 tablet 3  . lovastatin (MEVACOR) 20 MG tablet Take 20 mg by mouth.    . Multiple Vitamins-Minerals (MULTIVITAL) tablet Take by mouth.    . NONFORMULARY OR COMPOUNDED ITEM Allergen immunotherapy (Patient taking differently: Allergy shots (new vial: shot given weekly for four weeks) Then once every four weeks.) 2 each 1  . nortriptyline (PAMELOR) 25 MG capsule Take 1 capsule (25 mg total) by mouth at bedtime. 90 capsule 1  . omeprazole (PRILOSEC) 40 MG capsule Take 40 mg by mouth daily.    . potassium chloride SA (KLOR-CON M20) 20 MEQ tablet Take 1 tablet (20 mEq total) by mouth daily. 90 tablet 3   No current facility-administered medications on file prior to visit.     Review of Systems:  As per HPI- otherwise negative. No CP or SOB   Physical Examination: Vitals:   07/31/17 0913  BP: (!) 144/78  Pulse: 74  Resp: 16  SpO2: 100%   Vitals:   07/31/17 0913  Weight: 214 lb (97.1 kg)  Height: 5\' 4"  (1.626 m)   Body mass index is 36.73  kg/m. Ideal Body Weight: Weight in (lb) to have BMI = 25: 145.3  GEN: WDWN, NAD, Non-toxic, A & O x 3, obese, otherwise looks well  HEENT: Atraumatic, Normocephalic. Neck supple. No masses, No LAD.  Bilateral TM wnl, oropharynx normal.  PEERL,EOMI.   Ears and Nose: No external deformity. CV: RRR, No M/G/R. No JVD. No thrill. No extra heart sounds. PULM: CTA B, no wheezes, crackles, rhonchi.  No retractions. No resp. distress. No accessory muscle use. ABD: S, NT, ND, +BS. No rebound. No HSM. Ventral hernia is apparent when pt sits up from supine EXTR: No c/c/e NEURO Normal gait.  PSYCH: Normally interactive. Conversant. Not depressed or anxious appearing.  Calm demeanor.  Hands; she does show changes of OA in her fingers, mostly mild but more pronounced at left thumb and index  She notes tenderness at the medial nail fold on the right great toenail, but does not show any sign of infection at this time   Assessment and Plan: Hiatal hernia - Plan: DG Chest 2 View  Regurgitation of stomach contents - Plan: DG Chest 2 View  Osteoarthritis of both hands, unspecified osteoarthritis type - Plan: DG Hand Complete Left  Ingrown right big toenail  Pt reports history of hiatal hernia. This was seen on CT imaging and UGI back in 2013, but not mentioned on CXR today Her current sx of epigastric fullness and regurgitation of food may be due to [facility]  I did call GI and was able to get her an appt next Tuesday 6/18 at 10:45- pt can make this appt  Called pt and gave her radiology reports regarding her CXR and hand films.  Hands do show OA as we had suspected Scheduled podiatry appt for her for next week    Signed Lamar Blinks, MD  LEFT HAND - COMPLETE 3+ VIEW COMPARISON:  None.  FINDINGS: The left radiocarpal joint space appears normal and the ulnar styloid is intact. The carpal bones are normal position. However, there is significant degenerative joint disease involving the left first [facility]  joint with loss of joint space, sclerosis, and spurring present. MCP, PIP, DIP joints are unremarkable with only mild degenerative change of the DIP joints noted. No erosion is seen.  IMPRESSION: 1. Significant degenerative joint disease involves the left first [facility] joint. 2. Mild degenerative change of the DIP joints.  No erosion.  CHEST - 2 VIEW COMPARISON: 06/18/2012  FINDINGS: Heart and mediastinal contours are within normal limits. No focal opacities or effusions. No acute bony abnormality. Mild peribronchial thickening.  IMPRESSION: Mild bronchitic changes.

## 2017-07-31 ENCOUNTER — Ambulatory Visit (HOSPITAL_BASED_OUTPATIENT_CLINIC_OR_DEPARTMENT_OTHER)
Admission: RE | Admit: 2017-07-31 | Discharge: 2017-07-31 | Disposition: A | Discharge status: Home / Self Care | Payer: Medicare Other | Source: Ambulatory Visit | Attending: Family Medicine | Admitting: Family Medicine

## 2017-07-31 ENCOUNTER — Ambulatory Visit (INDEPENDENT_AMBULATORY_CARE_PROVIDER_SITE_OTHER): Payer: Medicare Other | Admitting: Family Medicine

## 2017-07-31 ENCOUNTER — Encounter: Payer: Self-pay | Admitting: Family Medicine

## 2017-07-31 VITALS — BP 144/78 | HR 74 | Resp 16 | Ht 64.0 in | Wt 214.0 lb

## 2017-07-31 DIAGNOSIS — M19041 Primary osteoarthritis, right hand: Secondary | ICD-10-CM

## 2017-07-31 DIAGNOSIS — K449 Diaphragmatic hernia without obstruction or gangrene: Secondary | ICD-10-CM

## 2017-07-31 DIAGNOSIS — R1110 Vomiting, unspecified: Secondary | ICD-10-CM | POA: Insufficient documentation

## 2017-07-31 DIAGNOSIS — J40 Bronchitis, not specified as acute or chronic: Secondary | ICD-10-CM | POA: Diagnosis not present

## 2017-07-31 DIAGNOSIS — M19042 Primary osteoarthritis, left hand: Secondary | ICD-10-CM

## 2017-07-31 DIAGNOSIS — L600 Ingrowing nail: Secondary | ICD-10-CM

## 2017-07-31 NOTE — Patient Instructions (Addendum)
We will get x-rays of your chest and left hand today- we will then figure out the next step with your hiatal hernia  Next Thursday at 9:15 am podiatry appt for your toenail [facility] Address: [address] [place], [place], [place] [address]  Hours:   Phone: [phone]

## 2017-08-05 ENCOUNTER — Encounter: Payer: Self-pay | Admitting: Physician Assistant

## 2017-08-05 ENCOUNTER — Ambulatory Visit: Payer: Medicare Other | Admitting: Physician Assistant

## 2017-08-05 ENCOUNTER — Telehealth: Payer: Self-pay | Admitting: Physician Assistant

## 2017-08-05 VITALS — BP 122/68 | HR 80 | Ht 65.0 in | Wt 215.0 lb

## 2017-08-05 DIAGNOSIS — R059 Cough, unspecified: Secondary | ICD-10-CM

## 2017-08-05 DIAGNOSIS — R140 Abdominal distension (gaseous): Secondary | ICD-10-CM

## 2017-08-05 DIAGNOSIS — R150 Incomplete defecation: Secondary | ICD-10-CM | POA: Diagnosis not present

## 2017-08-05 DIAGNOSIS — K219 Gastro-esophageal reflux disease without esophagitis: Secondary | ICD-10-CM

## 2017-08-05 DIAGNOSIS — R05 Cough: Secondary | ICD-10-CM | POA: Diagnosis not present

## 2017-08-05 MED ORDER — PANTOPRAZOLE SODIUM 40 MG PO TBEC: 40.0000 mg | DELAYED_RELEASE_TABLET | Freq: Every day | ORAL | 2 refills | Status: DC

## 2017-08-05 MED ORDER — HYDROCORTISONE ACETATE 25 MG RE SUPP: 25.0000 mg | Freq: Two times a day (BID) | RECTAL | 1 refills | Status: DC

## 2017-08-05 NOTE — Patient Instructions (Addendum)
If you are age 72 or older, your body mass index should be between 23-30. Your Body mass index is 35.78 kg/m. If this is out of the aforementioned range listed, please consider follow up with your Primary Care Provider.  If you are age 44 or younger, your body mass index should be between 19-25. Your Body mass index is 35.78 kg/m. If this is out of the aformentioned range listed, please consider follow up with your Primary Care Provider.   We have sent the following medications to your pharmacy for you to pick up at your convenience: Pantoprazole  Start Miralax daily.  Increase Fiber to 25-35 grams daily with fiber supplement.  Please discontinue Omeprazole and Aloe.  Follow up with me on September 03, 2017 at 10:45 am.  Thank you for choosing me and [facility].   Ellouise Newer, PA-C

## 2017-08-05 NOTE — Progress Notes (Addendum)
Chief Complaint: Bloating, coughing and fecal incontinence  HPI:    Stephanie Parks is a 72 year old female, known to Dr. Hilarie Fredrickson, who was referred to me by Copland, Gay Filler, MD for a complaint of bloating, coughing and fecal incontinence.      12/10/2011 office visit for upper abdominal symptoms without clear explanation, CT at that time revealed a small hiatal hernia moderately large colonic stool burden.  Previous trial of rifaximin for bacterial overgrowth with no relief of bloating.  At that time started on omeprazole 40 mg once daily.  Previous endoscopy, gastric emptying study and CT unrevealing.  CRC screening recommended in March 2023.    Today, explains that over the past 1 to 2 months she has had an increase in bloating, worse after eating anything.  Also describes that when she eats she typically will get a cough afterwards.  Does not believe her Omeprazole is helping at all.  Tells me that when she takes this medication she always feels like she has to burp.    Also describes passing some stool inadvertently with gas every day or every other day over the past 3 weeks.     Currently only using aloe after meals to help with constipation.  Does describe some good bowel movements throughout the day but also complains of not feeling empty at times.    Denies fever, chills, abdominal pain, weight loss, anorexia, nausea, vomiting, dysphagia or symptoms that awaken her at night.  Past Medical History:  Diagnosis Date  . GERD (gastroesophageal reflux disease)   . Hiatal hernia   . Hyperlipidemia   . Hypertension   . Migraine headache   . Schatzki's ring     Past Surgical History:  Procedure Laterality Date  . ABDOMINAL HYSTERECTOMY    . BACK SURGERY     x 3  . CARPAL TUNNEL RELEASE     left wrist  . CERVICAL SPINE SURGERY    . HEMORRHOID SURGERY      Current Outpatient Medications  Medication Sig Dispense Refill  . alendronate (FOSAMAX) 10 MG tablet Take 1 tablet (10 mg total) by  mouth daily before breakfast. Take with a full glass of water on an empty stomach. 30 tablet 11  . aspirin 81 MG tablet Take 81 mg by mouth daily.    Marland Kitchen azelastine (ASTELIN) 0.1 % nasal spray Place 2 sprays into both nostrils 2 (two) times daily as needed for rhinitis. Use in each nostril as directed 30 mL 5  . calcium carbonate (OS-CAL) 600 MG tablet Take 1 tablet (600 mg total) by mouth 2 (two) times daily with a meal. 60 tablet 0  . fluticasone (FLONASE) 50 MCG/ACT nasal spray USE 2 SPRAYS IN NOSTRIL(S) DAILY AS NEEDED.  3  . losartan-hydrochlorothiazide (HYZAAR) 50-12.5 MG tablet Take 1 tablet by mouth daily. 90 tablet 3  . lovastatin (MEVACOR) 20 MG tablet Take 20 mg by mouth.    . Multiple Vitamins-Minerals (MULTIVITAL) tablet Take by mouth.    . NONFORMULARY OR COMPOUNDED ITEM Allergen immunotherapy (Patient taking differently: Allergy shots (new vial: shot given weekly for four weeks) Then once every four weeks.) 2 each 1  . nortriptyline (PAMELOR) 25 MG capsule Take 1 capsule (25 mg total) by mouth at bedtime. 90 capsule 1  . omeprazole (PRILOSEC) 40 MG capsule Take 40 mg by mouth daily.    . potassium chloride SA (KLOR-CON M20) 20 MEQ tablet Take 1 tablet (20 mEq total) by mouth daily. 90 tablet 3  No current facility-administered medications for this visit.     Allergies as of 08/05/2017 - Review Complete 08/05/2017  Allergen Reaction Noted  . Shellfish allergy Hives and Swelling 12/30/2010  . Ace inhibitors Other (See Comments) 03/01/2015  . Pitavastatin  03/01/2015  . Sulfa antibiotics Other (See Comments) and Rash 11/29/2010    Family History  Problem Relation Age of Onset  . Allergic rhinitis Sister   . Diabetes Sister   . Hypertension Sister   . Heart attack Father 42  . Heart disease Father   . Stomach cancer Maternal Grandmother   . Heart disease Mother   . Colon cancer Neg Hx   . Angioedema Neg Hx   . Asthma Neg Hx   . Eczema Neg Hx   . Urticaria Neg Hx   .  Immunodeficiency Neg Hx   . Breast cancer Neg Hx     Social History   Socioeconomic History  . Marital status: Divorced    Spouse name: Not on file  . Number of children: 2  . Years of education: Not on file  . Highest education level: Not on file  Occupational History  . Occupation: [employer]  . Financial resource strain: Not on file  . Food insecurity:    Worry: Not on file    Inability: Not on file  . Transportation needs:    Medical: Not on file    Non-medical: Not on file  Tobacco Use  . Smoking status: Former Smoker    Packs/day: 0.50    Years: 20.00    Pack years: 10.00    Types: Cigarettes    Last attempt to quit: 02/19/1984    Years since quitting: 33.4  . Smokeless tobacco: Never Used  Substance and Sexual Activity  . Alcohol use: No  . Drug use: No  . Sexual activity: Yes    Partners: Male  Lifestyle  . Physical activity:    Days per week: Not on file    Minutes per session: Not on file  . Stress: Not on file  Relationships  . Social connections:    Talks on phone: Not on file    Gets together: Not on file    Attends religious service: Not on file    Active member of club or organization: Not on file    Attends meetings of clubs or organizations: Not on file    Relationship status: Not on file  . Intimate partner violence:    Fear of current or ex partner: Not on file    Emotionally abused: Not on file    Physically abused: Not on file    Forced sexual activity: Not on file  Other Topics Concern  . Not on file  Social History Narrative   Lives alone   Caffeine use: Coffee one cup daily   Right handed     Review of Systems:    Constitutional: No weight loss, fever or chills Skin: No rash  Cardiovascular: No chest pain  Respiratory: No SOB  Gastrointestinal: See HPI and otherwise negative Genitourinary: No dysuria  Neurological: No headache, dizziness or syncope Musculoskeletal: No new muscle or joint pain Hematologic: No  bleeding Psychiatric: No history of depression or anxiety   Physical Exam:  Vital signs: BP 122/68   Pulse 80   Ht 5\' 5"  (1.651 m)   Wt 215 lb (97.5 kg)   BMI 35.78 kg/m   Constitutional:   Pleasant AA female appears to be in NAD,  Well developed, Well nourished, alert and cooperative Head:  Normocephalic and atraumatic. Eyes:   PEERL, EOMI. No icterus. Conjunctiva pink. Ears:  Normal auditory acuity. Neck:  Supple Throat: Oral cavity and pharynx without inflammation, swelling or lesion.  Respiratory: Respirations even and unlabored. Lungs clear to auscultation bilaterally.   No wheezes, crackles, or rhonchi.  Cardiovascular: Normal S1, S2. No MRG. Regular rate and rhythm. No peripheral edema, cyanosis or pallor.  Gastrointestinal:  Soft, nondistended, nontender. No rebound or guarding. Normal bowel sounds. No appreciable masses or hepatomegaly. Rectal:  External: posterior hemorrhoid tag; Internal: minimal decrease in sphincter tone; Anoscopy: Grade I hemorrhoids Msk:  Symmetrical without gross deformities. Without edema, no deformity or joint abnormality.  Neurologic:  Alert and  oriented x4;  grossly normal neurologically.  Skin:   Dry and intact without significant lesions or rashes. Psychiatric:  Demonstrates good judgement and reason without abnormal affect or behaviors.  MOST RECENT LABS AND IMAGING: CBC    Component Value Date/Time   WBC 6.2 03/05/2017 1644   RBC 3.75 (L) 03/05/2017 1644   HGB 10.9 (L) 03/05/2017 1644   HCT 33.0 (L) 03/05/2017 1644   PLT 327.0 03/05/2017 1644   MCV 88.2 03/05/2017 1644   MCH 29.6 06/15/2016 2138   MCHC 33.1 03/05/2017 1644   RDW 15.2 03/05/2017 1644   LYMPHSABS 3.1 06/15/2016 2138   MONOABS 0.6 06/15/2016 2138   EOSABS 0.4 06/15/2016 2138   BASOSABS 0.0 06/15/2016 2138    CMP     Component Value Date/Time   NA 141 03/05/2017 1644   NA 144 08/30/2015   K 4.1 03/05/2017 1644   CL 101 03/05/2017 1644   CO2 36 (H) 03/05/2017  1644   GLUCOSE 80 03/05/2017 1644   BUN 15 03/05/2017 1644   BUN 13 08/30/2015   CREATININE 0.80 03/05/2017 1644   CALCIUM 9.2 03/05/2017 1644   PROT 6.4 03/05/2017 1644   ALBUMIN 3.5 03/05/2017 1644   AST 18 03/05/2017 1644   ALT 13 03/05/2017 1644   ALKPHOS 62 03/05/2017 1644   BILITOT 0.3 03/05/2017 1644   GFRNONAA >60 06/15/2016 2138   GFRAA >60 06/15/2016 2138    Assessment: 1.  Bloating: Seems to be a chronic problem for the patient though she explains this has gotten worse recently, consider relation to IBS, treatment for SIBO has been tried in the past, extensive work-up in 2013, none since then 2.  GERD: Increase in symptoms of bloating and reflux recently irregardless of Omeprazole; Consider tachyphylaxis versus other 3.  Cough: After eating, consider possible relation to reflux versus other 4. Fecal incontinence: Grade I hemorrhoids at time of exam today + constipation-likely cause  Plan: 1.  Stop Omeprazole.  Start Pantoprazole 40 mg daily, 30 to 60 minutes before eating breakfast.  #30 with 3 refills 2.  Recommend patient increase fiber in her diet to at least 25-35 g/day with use of fiber supplement such as Metamucil, Citrucel or Benefiber.  Also provided a high-fiber diet handout. 3.  Recommend the patient continue her increased water intake of at least 6-8 8 ounce glasses of water per day. 4.  Recommend the patient stop her aloe and start MiraLAX daily titrated to a normal bowel movement. 5.  Prescribed Hydrocortisone suppositories twice daily x7 days with 1 refill. 6.  Patient to follow with me in 4-6 weeks.  If symptoms are no better could consider upper GI study versus repeat EGD and possibly FD guard.  Stephanie Newer, PA-C [facility] 08/05/2017,  11:08 AM  Cc: Copland, Gay Filler, MD   Addendum: Reviewed and agree with initial management. Pyrtle, Lajuan Lines, MD

## 2017-08-07 ENCOUNTER — Ambulatory Visit: Payer: Medicare Other | Admitting: Podiatry

## 2017-08-07 DIAGNOSIS — L600 Ingrowing nail: Secondary | ICD-10-CM | POA: Diagnosis not present

## 2017-08-07 MED ORDER — CEPHALEXIN 500 MG PO CAPS: 500.0000 mg | ORAL_CAPSULE | Freq: Three times a day (TID) | ORAL | 0 refills | Status: DC

## 2017-08-07 NOTE — Patient Instructions (Signed)

## 2017-08-10 DIAGNOSIS — L600 Ingrowing nail: Secondary | ICD-10-CM | POA: Insufficient documentation

## 2017-08-10 HISTORY — DX: Ingrowing nail: L60.0

## 2017-08-10 NOTE — Progress Notes (Signed)
Subjective: 72 year old female presents the office today for concerns of ingrown toenail to the right big toe and the area is tender with painful and pressure.  This is been ongoing for about 1 week.  Denies any pus or any red streaks coming from the area.  She said no recent treatment otherwise.  She has no other concerns. Denies any systemic complaints such as fevers, chills, nausea, vomiting. No acute changes since last appointment, and no other complaints at this time.   Objective: AAO x3, NAD DP/PT pulses palpable bilaterally, CRT less than 3 seconds There is incurvation present on the medial aspect the right hallux toenail with tenderness palpation there is localized edema but there is no significant erythema or increase in warmth.  There is no ascending sialitis.  There is no fluctuation or crepitation or any malodor.  The nail is hypertrophic and discolored.  No open lesions or pre-ulcerative lesions.  No pain with calf compression, swelling, warmth, erythema  Assessment: 72 year old female with right medial hallux ingrown toenail  Plan: -All treatment options discussed with the patient including all alternatives, risks, complications.  -At this time, discussed partial nail removal with chemical matricectomy to the symptomatic portion of the nail. Risks and complications were discussed with the patient for which they understand and written consent was obtained. Under sterile conditions a total of 3 mL of a mixture of 2% lidocaine plain and 0.5% Marcaine plain was infiltrated in a hallux block fashion. Once anesthetized, the skin was prepped in sterile fashion. A tourniquet was then applied. Next the medial aspect of hallux nail border was then sharply excised making sure to remove the entire offending nail border. Once the nails were ensured to be removed area was debrided and the underlying skin was intact. There is no purulence identified in the procedure. Next phenol was then applied under  standard conditions and copiously irrigated. Silvadene was applied. A dry sterile dressing was applied. After application of the dressing the tourniquet was removed and there is found to be an immediate capillary refill time to the digit. The patient tolerated the procedure well any complications. Post procedure instructions were discussed the patient for which he verbally understood. Follow-up in one week for nail check or sooner if any problems are to arise. Discussed signs/symptoms of infection and directed to call the office immediately should any occur or go directly to the emergency room. In the meantime, encouraged to call the office with any questions, concerns, changes symptoms. -Keflex -Patient encouraged to call the office with any questions, concerns, change in symptoms.   Trula Slade DPM

## 2017-08-12 ENCOUNTER — Encounter: Payer: Self-pay | Admitting: Neurology

## 2017-08-12 ENCOUNTER — Ambulatory Visit (INDEPENDENT_AMBULATORY_CARE_PROVIDER_SITE_OTHER): Payer: Medicare Other | Admitting: Neurology

## 2017-08-12 ENCOUNTER — Ambulatory Visit: Payer: Medicare Other | Admitting: Neurology

## 2017-08-12 DIAGNOSIS — R202 Paresthesia of skin: Secondary | ICD-10-CM

## 2017-08-12 DIAGNOSIS — M79605 Pain in left leg: Secondary | ICD-10-CM | POA: Diagnosis not present

## 2017-08-12 DIAGNOSIS — M79604 Pain in right leg: Secondary | ICD-10-CM

## 2017-08-12 NOTE — Procedures (Signed)
     HISTORY:  Stephanie Parks is a 72 year old patient with a history of prior lumbosacral spine surgery.  She reports intermittent pain may may alternate from one leg to the next lasting only a few moments with full resolution.  The patient is being evaluated for a possible neuropathy or a radiculopathy.  NERVE CONDUCTION STUDIES:  Nerve conduction studies were performed on both lower extremities. The distal motor latencies and motor amplitudes for the peroneal and posterior tibial nerves were within normal limits. The nerve conduction velocities for these nerves were also normal. The sensory latencies for the peroneal and sural nerves were within normal limits. The F wave latencies for the posterior tibial nerves were within normal limits.   EMG STUDIES:  EMG study was performed on the right lower extremity:  The tibialis anterior muscle reveals 2 to 4K motor units with full recruitment. No fibrillations or positive waves were seen. The peroneus tertius muscle reveals 2 to 4K motor units with full recruitment. No fibrillations or positive waves were seen. The medial gastrocnemius muscle reveals 1 to 3K motor units with full recruitment. No fibrillations or positive waves were seen. The vastus lateralis muscle reveals 2 to 4K motor units with full recruitment. No fibrillations or positive waves were seen. The iliopsoas muscle reveals 2 to 4K motor units with full recruitment. No fibrillations or positive waves were seen. The biceps femoris muscle (long head) reveals 2 to 4K motor units with full recruitment. No fibrillations or positive waves were seen. The lumbosacral paraspinal muscles were tested at 3 levels, and revealed no abnormalities of insertional activity at all 3 levels tested. There was good relaxation.  EMG study was performed on the left lower extremity:  The tibialis anterior muscle reveals 2 to 4K motor units with full recruitment. No fibrillations or positive waves were  seen. The peroneus tertius muscle reveals 2 to 4K motor units with full recruitment. No fibrillations or positive waves were seen. The medial gastrocnemius muscle reveals 1 to 3K motor units with full recruitment. No fibrillations or positive waves were seen. The vastus lateralis muscle reveals 2 to 4K motor units with full recruitment. No fibrillations or positive waves were seen. The iliopsoas muscle reveals 2 to 4K motor units with full recruitment. No fibrillations or positive waves were seen. The biceps femoris muscle (long head) reveals 2 to 4K motor units with full recruitment. No fibrillations or positive waves were seen. The lumbosacral paraspinal muscles were tested at 3 levels, and revealed no abnormalities of insertional activity at all 3 levels tested. There was good relaxation.   IMPRESSION:  Nerve conduction studies done on both lower extremities were within normal limits.  No evidence of a neuropathy is seen.  EMG evaluation of both lower extremities were unremarkable without evidence of an overlying lumbosacral radiculopathy on either side.  Jill Alexanders MD 08/12/2017 1:04 PM  [facility] [address] [place], [place] [address]  Phone [phone] Fax [phone]

## 2017-08-12 NOTE — Progress Notes (Addendum)
The patient comes in today for EMG nerve conduction study evaluation.  EMG nerve conduction study was unremarkable, the patient claims that her leg pain is intermittent in nature and may hit 1 leg or the other, lasting only a few seconds and going away.  She does not wish to have any medication for the pain.   [place]    Nerve / Sites Muscle Latency Ref. Amplitude Ref. Rel Amp Segments Distance Velocity Ref. Area    ms ms mV mV %  cm m/s m/s mVms  R Peroneal - EDB     Ankle EDB 4.8 ?6.5 2.1 ?2.0 100 Ankle - EDB 9   7.3     Fib head EDB 11.5  1.8  85.7 Fib head - Ankle 33 49 ?44 6.9     Pop fossa EDB 13.6  1.8  99.2 Pop fossa - Fib head 10 48 ?44 6.9         Pop fossa - Ankle      L Peroneal - EDB     Ankle EDB 4.9 ?6.5 2.2 ?2.0 100 Ankle - EDB 9   7.2     Fib head EDB 12.0  1.9  83.9 Fib head - Ankle 33 46 ?44 6.0     Pop fossa EDB 14.1  1.7  88.7 Pop fossa - Fib head 10 49 ?44 4.9         Pop fossa - Ankle      R Tibial - AH     Ankle AH 4.8 ?5.8 4.5 ?4.0 100 Ankle - AH 9   13.1     Pop fossa AH 13.5  4.8  105 Pop fossa - Ankle 36 42 ?41 13.0  L Tibial - AH     Ankle AH 4.7 ?5.8 5.7 ?4.0 100 Ankle - AH 9   16.2     Pop fossa AH 13.4  4.2  73.5 Pop fossa - Ankle 36 41 ?41 14.7             SNC    Nerve / Sites Rec. Site Peak Lat Ref.  Amp Ref. Segments Distance    ms ms V V  cm  R Sural - Ankle (Calf)     Calf Ankle 4.2 ?4.4 10 ?6 Calf - Ankle 14  L Sural - Ankle (Calf)     Calf Ankle 4.3 ?4.4 9 ?6 Calf - Ankle 14  R Superficial peroneal - Ankle     Lat leg Ankle 3.8 ?4.4 11 ?6 Lat leg - Ankle 14  L Superficial peroneal - Ankle     Lat leg Ankle 3.6 ?4.4 10 ?6 Lat leg - Ankle 14              F  Wave    Nerve F Lat Ref.   ms ms  R Tibial - AH 48.9 ?56.0  L Tibial - AH 47.7 ?56.0

## 2017-08-12 NOTE — Progress Notes (Signed)
Please refer to EMG and nerve conduction study procedure note. 

## 2017-08-13 NOTE — Progress Notes (Deleted)
Subjective:   Stephanie Parks is a 72 y.o. female who presents for Medicare Annual (Subsequent) preventive examination.  Review of Systems: No ROS.  Medicare Wellness Visit. Additional risk factors are reflected in the social history.   Sleep patterns: Home Safety/Smoke Alarms: Feels safe in home. Smoke alarms in place.  Living environment; residence and Firearm Safety:   Female:   Pap-       Mammo-       Dexa scan- utd       CCS- due 2023     Objective:     Vitals: There were no vitals taken for this visit.  There is no height or weight on file to calculate BMI.  Advanced Directives 08/15/2016 06/15/2016 03/23/2016 08/10/2015  Does Patient Have a Medical Advance Directive? No No No No  Would patient like information on creating a medical advance directive? No - Patient declined No - Patient declined - No - patient declined information    Tobacco Social History   Tobacco Use  Smoking Status Former Smoker  . Packs/day: 0.50  . Years: 20.00  . Pack years: 10.00  . Types: Cigarettes  . Last attempt to quit: 02/19/1984  . Years since quitting: 33.5  Smokeless Tobacco Never Used     Counseling given: Not Answered   Clinical Intake:                       Past Medical History:  Diagnosis Date  . GERD (gastroesophageal reflux disease)   . Hiatal hernia   . Hyperlipidemia   . Hypertension   . Migraine headache   . Schatzki's ring    Past Surgical History:  Procedure Laterality Date  . ABDOMINAL HYSTERECTOMY    . BACK SURGERY     x 3  . CARPAL TUNNEL RELEASE     left wrist  . CERVICAL SPINE SURGERY    . HEMORRHOID SURGERY     Family History  Problem Relation Age of Onset  . Allergic rhinitis Sister   . Diabetes Sister   . Hypertension Sister   . Heart attack Father 39  . Heart disease Father   . Stomach cancer Maternal Grandmother   . Heart disease Mother   . Colon cancer Neg Hx   . Angioedema Neg Hx   . Asthma Neg Hx   . Eczema Neg Hx     . Urticaria Neg Hx   . Immunodeficiency Neg Hx   . Breast cancer Neg Hx    Social History   Socioeconomic History  . Marital status: Divorced    Spouse name: Not on file  . Number of children: 2  . Years of education: Not on file  . Highest education level: Not on file  Occupational History  . Occupation: [employer]  . Financial resource strain: Not on file  . Food insecurity:    Worry: Not on file    Inability: Not on file  . Transportation needs:    Medical: Not on file    Non-medical: Not on file  Tobacco Use  . Smoking status: Former Smoker    Packs/day: 0.50    Years: 20.00    Pack years: 10.00    Types: Cigarettes    Last attempt to quit: 02/19/1984    Years since quitting: 33.5  . Smokeless tobacco: Never Used  Substance and Sexual Activity  . Alcohol use: No  . Drug use: No  . Sexual activity:  Yes    Partners: Male  Lifestyle  . Physical activity:    Days per week: Not on file    Minutes per session: Not on file  . Stress: Not on file  Relationships  . Social connections:    Talks on phone: Not on file    Gets together: Not on file    Attends religious service: Not on file    Active member of club or organization: Not on file    Attends meetings of clubs or organizations: Not on file    Relationship status: Not on file  Other Topics Concern  . Not on file  Social History Narrative   Lives alone   Caffeine use: Coffee one cup daily   Right handed     Outpatient Encounter Medications as of 08/18/2017  Medication Sig  . alendronate (FOSAMAX) 10 MG tablet Take 1 tablet (10 mg total) by mouth daily before breakfast. Take with a full glass of water on an empty stomach.  Marland Kitchen aspirin 81 MG tablet Take 81 mg by mouth daily.  Marland Kitchen azelastine (ASTELIN) 0.1 % nasal spray Place 2 sprays into both nostrils 2 (two) times daily as needed for rhinitis. Use in each nostril as directed  . calcium carbonate (OS-CAL) 600 MG tablet Take 1 tablet (600 mg total) by  mouth 2 (two) times daily with a meal.  . cephALEXin (KEFLEX) 500 MG capsule Take 1 capsule (500 mg total) by mouth 3 (three) times daily.  . fluticasone (FLONASE) 50 MCG/ACT nasal spray USE 2 SPRAYS IN NOSTRIL(S) DAILY AS NEEDED.  . hydrocortisone (ANUSOL-HC) 25 MG suppository Place 1 suppository (25 mg total) rectally 2 (two) times daily. Use for 7 days.  Marland Kitchen losartan-hydrochlorothiazide (HYZAAR) 50-12.5 MG tablet Take 1 tablet by mouth daily.  Marland Kitchen lovastatin (MEVACOR) 20 MG tablet Take 20 mg by mouth.  . Multiple Vitamins-Minerals (MULTIVITAL) tablet Take by mouth.  . NONFORMULARY OR COMPOUNDED ITEM Allergen immunotherapy (Patient taking differently: Allergy shots (new vial: shot given weekly for four weeks) Then once every four weeks.)  . nortriptyline (PAMELOR) 25 MG capsule Take 1 capsule (25 mg total) by mouth at bedtime.  . pantoprazole (PROTONIX) 40 MG tablet Take 1 tablet (40 mg total) by mouth daily. Take 30-60 minutes before breakfast.  . potassium chloride SA (KLOR-CON M20) 20 MEQ tablet Take 1 tablet (20 mEq total) by mouth daily.   No facility-administered encounter medications on file as of 08/18/2017.     Activities of Daily Living In your present state of health, do you have any difficulty performing the following activities: 08/15/2016  Hearing? N  Vision? N  Difficulty concentrating or making decisions? N  Walking or climbing stairs? N  Dressing or bathing? N  Doing errands, shopping? N  Preparing Food and eating ? N  Using the Toilet? N  In the past six months, have you accidently leaked urine? Y  Do you have problems with loss of bowel control? N  Managing your Medications? N  Managing your Finances? N  Housekeeping or managing your Housekeeping? N  Some recent data might be hidden    Patient Care Team: Copland, Gay Filler, MD as PCP - General (Family Medicine) Ashok Pall, MD as Consulting Physician (Neurosurgery)    Assessment:   This is a routine wellness  examination for [facility]. Physical assessment deferred to PCP.  Exercise Activities and Dietary recommendations   Diet (meal preparation, eat out, water intake, caffeinated beverages, dairy products, fruits and vegetables): {Desc; diets:16563} Breakfast:  Lunch:  Dinner:      Goals    . Weight (lb) < 200 lb (90.7 kg) (pt-stated)     Diet and exercise.       Fall Risk Fall Risk  08/15/2016 10/09/2015  Falls in the past year? No No    Depression Screen PHQ 2/9 Scores 08/15/2016 10/09/2015  PHQ - 2 Score 0 0     Cognitive Function         There is no immunization history on file for this patient.   Screening Tests Health Maintenance  Topic Date Due  . TETANUS/TDAP  10/08/1964  . PNA vac Low Risk Adult (1 of 2 - PCV13) 12/19/2017 (Originally 10/09/2010)  . INFLUENZA VACCINE  09/18/2017  . MAMMOGRAM  09/13/2018  . COLONOSCOPY  05/06/2021  . DEXA SCAN  Completed  . Hepatitis C Screening  Completed      Plan:   ***   I have personally reviewed and noted the following in the patient's chart:   . Medical and social history . Use of alcohol, tobacco or illicit drugs  . Current medications and supplements . Functional ability and status . Nutritional status . Physical activity . Advanced directives . List of other physicians . Hospitalizations, surgeries, and ER visits in previous 12 months . Vitals . Screenings to include cognitive, depression, and falls . Referrals and appointments  In addition, I have reviewed and discussed with patient certain preventive protocols, quality metrics, and best practice recommendations. A written personalized care plan for preventive services as well as general preventive health recommendations were provided to patient.     Shela Nevin, [place]  08/13/2017

## 2017-08-14 ENCOUNTER — Telehealth: Payer: Self-pay | Admitting: Physician Assistant

## 2017-08-14 ENCOUNTER — Other Ambulatory Visit: Payer: Self-pay | Admitting: Physician Assistant

## 2017-08-14 MED ORDER — HYDROCORTISONE 0.5 % EX CREA: 1.0000 "application " | TOPICAL_CREAM | Freq: Two times a day (BID) | CUTANEOUS | 0 refills | Status: DC

## 2017-08-14 NOTE — Telephone Encounter (Signed)
Per Ellouise Newer, PA-C ok to get hydrocortisone cream and apply to a glycerin suppositories with same instructions. Patient verbalized understanding and Rx sent to pharmacy.

## 2017-08-14 NOTE — Telephone Encounter (Signed)
The pt actually called to reschedule her appt with Ellouise Newer due to being out of town that day.  Appt has been rescheduled per pt request.

## 2017-08-15 ENCOUNTER — Ambulatory Visit: Payer: Medicare Other | Admitting: Podiatry

## 2017-08-15 MED ORDER — HYDROCORTISONE 2.5 % RE CREA: 1.0000 "application " | TOPICAL_CREAM | Freq: Two times a day (BID) | RECTAL | 1 refills | Status: DC

## 2017-08-18 ENCOUNTER — Ambulatory Visit: Payer: Medicare Other | Admitting: *Deleted

## 2017-08-20 ENCOUNTER — Encounter: Payer: Self-pay | Admitting: Internal Medicine

## 2017-08-20 ENCOUNTER — Ambulatory Visit (INDEPENDENT_AMBULATORY_CARE_PROVIDER_SITE_OTHER): Payer: Medicare Other | Admitting: Internal Medicine

## 2017-08-20 VITALS — BP 126/70 | HR 81 | Temp 98.1°F | Resp 16 | Ht 65.0 in | Wt 216.2 lb

## 2017-08-20 DIAGNOSIS — J069 Acute upper respiratory infection, unspecified: Secondary | ICD-10-CM

## 2017-08-20 NOTE — Progress Notes (Signed)
Subjective:    Patient ID: Stephanie Parks, female    DOB: 1945/03/07, 72 y.o.   MRN: [id number]  DOS:  08/20/2017 Type of visit - description : acute Interval history: Symptoms started a week ago with clear runny nose, dry cough, sore throat. Report her allergy symptoms have increased with sneezing, itchy eyes and some headache. Not taking allergy medications regularly, taking Tylenol as needed   Review of Systems  No fever chills No nausea, vomiting, diarrhea No unusual aches or pains Past Medical History:  Diagnosis Date  . GERD (gastroesophageal reflux disease)   . Hiatal hernia   . Hyperlipidemia   . Hypertension   . Migraine headache   . Schatzki's ring     Past Surgical History:  Procedure Laterality Date  . ABDOMINAL HYSTERECTOMY    . BACK SURGERY     x 3  . CARPAL TUNNEL RELEASE     left wrist  . CERVICAL SPINE SURGERY    . HEMORRHOID SURGERY      Social History   Socioeconomic History  . Marital status: Divorced    Spouse name: Not on file  . Number of children: 2  . Years of education: Not on file  . Highest education level: Not on file  Occupational History  . Occupation: [employer]  . Financial resource strain: Not on file  . Food insecurity:    Worry: Not on file    Inability: Not on file  . Transportation needs:    Medical: Not on file    Non-medical: Not on file  Tobacco Use  . Smoking status: Former Smoker    Packs/day: 0.50    Years: 20.00    Pack years: 10.00    Types: Cigarettes    Last attempt to quit: 02/19/1984    Years since quitting: 33.5  . Smokeless tobacco: Never Used  Substance and Sexual Activity  . Alcohol use: No  . Drug use: No  . Sexual activity: Yes    Partners: Male  Lifestyle  . Physical activity:    Days per week: Not on file    Minutes per session: Not on file  . Stress: Not on file  Relationships  . Social connections:    Talks on phone: Not on file    Gets together: Not on file   Attends religious service: Not on file    Active member of club or organization: Not on file    Attends meetings of clubs or organizations: Not on file    Relationship status: Not on file  . Intimate partner violence:    Fear of current or ex partner: Not on file    Emotionally abused: Not on file    Physically abused: Not on file    Forced sexual activity: Not on file  Other Topics Concern  . Not on file  Social History Narrative   Lives alone   Caffeine use: Coffee one cup daily   Right handed       Allergies as of 08/20/2017      Reactions   Shellfish Allergy Hives, Swelling   Ace Inhibitors Other (See Comments)   Pitavastatin    Sulfa Antibiotics Other (See Comments), Rash   Fine bumps      Medication List        Accurate as of 08/20/17  6:06 PM. Always use your most recent med list.          alendronate 10 MG tablet Commonly  known as:  FOSAMAX Take 1 tablet (10 mg total) by mouth daily before breakfast. Take with a full glass of water on an empty stomach.   aspirin 81 MG tablet Take 81 mg by mouth daily.   azelastine 0.1 % nasal spray Commonly known as:  ASTELIN Place 2 sprays into both nostrils 2 (two) times daily as needed for rhinitis. Use in each nostril as directed   calcium carbonate 600 MG tablet Commonly known as:  OS-CAL Take 1 tablet (600 mg total) by mouth 2 (two) times daily with a meal.   fluticasone 50 MCG/ACT nasal spray Commonly known as:  FLONASE USE 2 SPRAYS IN NOSTRIL(S) DAILY AS NEEDED.   hydrocortisone 2.5 % rectal cream Commonly known as:  ANUSOL-HC Place 1 application rectally 2 (two) times daily.   hydrocortisone cream 0.5 % Apply 1 application topically 2 (two) times daily. Apply to a glycerin suppository and insert rectally   losartan-hydrochlorothiazide 50-12.5 MG tablet Commonly known as:  HYZAAR Take 1 tablet by mouth daily.   lovastatin 20 MG tablet Commonly known as:  MEVACOR Take 20 mg by mouth.   MULTIVITAL  tablet Take by mouth.   NONFORMULARY OR COMPOUNDED ITEM Allergen immunotherapy   nortriptyline 25 MG capsule Commonly known as:  PAMELOR Take 1 capsule (25 mg total) by mouth at bedtime.   pantoprazole 40 MG tablet Commonly known as:  PROTONIX Take 1 tablet (40 mg total) by mouth daily. Take 30-60 minutes before breakfast.   potassium chloride SA 20 MEQ tablet Commonly known as:  KLOR-CON M20 Take 1 tablet (20 mEq total) by mouth daily.          Objective:   Physical Exam BP 126/70 (BP Location: Left Arm, Patient Position: Sitting, Cuff Size: Normal)   Pulse 81   Temp 98.1 F (36.7 C) (Oral)   Resp 16   Ht 5\' 5"  (1.651 m)   Wt 216 lb 4 oz (98.1 kg)   SpO2 98%   BMI 35.99 kg/m  General:   Well developed, NAD, see BMI.  HEENT:  Normocephalic . Face symmetric, atraumatic.  TMs normal, nose congested, throat symmetric, no red or discharge Lungs:  CTA B Normal respiratory effort, no intercostal retractions, no accessory muscle use. Heart: RRR,  no murmur.  No pretibial edema bilaterally  Skin: Not pale. Not jaundice Neurologic:  alert & oriented X3.  Speech normal, gait appropriate for age and unassisted Psych--  Cognition and judgment appear intact.  Cooperative with normal attention span and concentration.  Behavior appropriate. No anxious or depressed appearing.      Assessment & Plan:   72 year old lady w/ PMH that includes hyperlipidemia, hypertension, h/o HAs on pamelor x > 10 years per pt, GERD. Also mild CAD by cath 2003, 2013 c/o  CP> saw cards, negative treadmill stress . Saw cardiology 06/2015, they noted a cardiac catheterization 2009 with nonobstructive heart disease, echo showed a normal EF,  Presents with the following:  URI: Upper respiratory symptoms due to URI versus allergies.  Recommend conservative treatment first with Mucinex, Flonase, Astelin, Zyrtec.  See AVS.  Call if not better.

## 2017-08-20 NOTE — Progress Notes (Deleted)
Subjective:   Stephanie Parks is a 72 y.o. female who presents for Medicare Annual (Subsequent) preventive examination.  Review of Systems: No ROS.  Medicare Wellness Visit. Additional risk factors are reflected in the social history.   Sleep patterns:  Home Safety/Smoke Alarms: Feels safe in home. Smoke alarms in place.  Living environment; residence and Firearm Safety:    Female:         Mammo-  utd     Dexa scan-  utd      CCS-due 04/2021     Objective:     Vitals: There were no vitals taken for this visit.  There is no height or weight on file to calculate BMI.  Advanced Directives 08/15/2016 06/15/2016 03/23/2016 08/10/2015  Does Patient Have a Medical Advance Directive? No No No No  Would patient like information on creating a medical advance directive? No - Patient declined No - Patient declined - No - patient declined information    Tobacco Social History   Tobacco Use  Smoking Status Former Smoker  . Packs/day: 0.50  . Years: 20.00  . Pack years: 10.00  . Types: Cigarettes  . Last attempt to quit: 02/19/1984  . Years since quitting: 33.5  Smokeless Tobacco Never Used     Counseling given: Not Answered   Clinical Intake:                       Past Medical History:  Diagnosis Date  . GERD (gastroesophageal reflux disease)   . Hiatal hernia   . Hyperlipidemia   . Hypertension   . Migraine headache   . Schatzki's ring    Past Surgical History:  Procedure Laterality Date  . ABDOMINAL HYSTERECTOMY    . BACK SURGERY     x 3  . CARPAL TUNNEL RELEASE     left wrist  . CERVICAL SPINE SURGERY    . HEMORRHOID SURGERY     Family History  Problem Relation Age of Onset  . Allergic rhinitis Sister   . Diabetes Sister   . Hypertension Sister   . Heart attack Father 45  . Heart disease Father   . Stomach cancer Maternal Grandmother   . Heart disease Mother   . Colon cancer Neg Hx   . Angioedema Neg Hx   . Asthma Neg Hx   . Eczema Neg Hx     . Urticaria Neg Hx   . Immunodeficiency Neg Hx   . Breast cancer Neg Hx    Social History   Socioeconomic History  . Marital status: Divorced    Spouse name: Not on file  . Number of children: 2  . Years of education: Not on file  . Highest education level: Not on file  Occupational History  . Occupation: [employer]  . Financial resource strain: Not on file  . Food insecurity:    Worry: Not on file    Inability: Not on file  . Transportation needs:    Medical: Not on file    Non-medical: Not on file  Tobacco Use  . Smoking status: Former Smoker    Packs/day: 0.50    Years: 20.00    Pack years: 10.00    Types: Cigarettes    Last attempt to quit: 02/19/1984    Years since quitting: 33.5  . Smokeless tobacco: Never Used  Substance and Sexual Activity  . Alcohol use: No  . Drug use: No  . Sexual activity:  Yes    Partners: Male  Lifestyle  . Physical activity:    Days per week: Not on file    Minutes per session: Not on file  . Stress: Not on file  Relationships  . Social connections:    Talks on phone: Not on file    Gets together: Not on file    Attends religious service: Not on file    Active member of club or organization: Not on file    Attends meetings of clubs or organizations: Not on file    Relationship status: Not on file  Other Topics Concern  . Not on file  Social History Narrative   Lives alone   Caffeine use: Coffee one cup daily   Right handed     Outpatient Encounter Medications as of 08/28/2017  Medication Sig  . alendronate (FOSAMAX) 10 MG tablet Take 1 tablet (10 mg total) by mouth daily before breakfast. Take with a full glass of water on an empty stomach.  Marland Kitchen aspirin 81 MG tablet Take 81 mg by mouth daily.  Marland Kitchen azelastine (ASTELIN) 0.1 % nasal spray Place 2 sprays into both nostrils 2 (two) times daily as needed for rhinitis. Use in each nostril as directed  . calcium carbonate (OS-CAL) 600 MG tablet Take 1 tablet (600 mg total)  by mouth 2 (two) times daily with a meal.  . fluticasone (FLONASE) 50 MCG/ACT nasal spray USE 2 SPRAYS IN NOSTRIL(S) DAILY AS NEEDED.  . hydrocortisone (ANUSOL-HC) 2.5 % rectal cream Place 1 application rectally 2 (two) times daily.  . hydrocortisone cream 0.5 % Apply 1 application topically 2 (two) times daily. Apply to a glycerin suppository and insert rectally  . losartan-hydrochlorothiazide (HYZAAR) 50-12.5 MG tablet Take 1 tablet by mouth daily.  Marland Kitchen lovastatin (MEVACOR) 20 MG tablet Take 20 mg by mouth.  . Multiple Vitamins-Minerals (MULTIVITAL) tablet Take by mouth.  . NONFORMULARY OR COMPOUNDED ITEM Allergen immunotherapy (Patient taking differently: Allergy shots (new vial: shot given weekly for four weeks) Then once every four weeks.)  . nortriptyline (PAMELOR) 25 MG capsule Take 1 capsule (25 mg total) by mouth at bedtime.  . pantoprazole (PROTONIX) 40 MG tablet Take 1 tablet (40 mg total) by mouth daily. Take 30-60 minutes before breakfast.  . potassium chloride SA (KLOR-CON M20) 20 MEQ tablet Take 1 tablet (20 mEq total) by mouth daily.   No facility-administered encounter medications on file as of 08/28/2017.     Activities of Daily Living In your present state of health, do you have any difficulty performing the following activities: 08/20/2017  Hearing? N  Vision? N  Difficulty concentrating or making decisions? N  Walking or climbing stairs? N  Dressing or bathing? N  Doing errands, shopping? N  Some recent data might be hidden    Patient Care Team: Copland, Gay Filler, MD as PCP - General (Family Medicine) Ashok Pall, MD as Consulting Physician (Neurosurgery)    Assessment:   This is a routine wellness examination for [facility]. Physical assessment deferred to PCP.  Exercise Activities and Dietary recommendations   Diet (meal preparation, eat out, water intake, caffeinated beverages, dairy products, fruits and vegetables): {Desc; diets:16563} Breakfast: Lunch:   Dinner:      Goals    . Weight (lb) < 200 lb (90.7 kg) (pt-stated)     Diet and exercise.       Fall Risk Fall Risk  08/20/2017 08/15/2016 10/09/2015  Falls in the past year? No No No    Depression  Screen PHQ 2/9 Scores 08/20/2017 08/15/2016 10/09/2015  PHQ - 2 Score 0 0 0     Cognitive Function         There is no immunization history on file for this patient.  Screening Tests Health Maintenance  Topic Date Due  . TETANUS/TDAP  10/08/1964  . PNA vac Low Risk Adult (1 of 2 - PCV13) 12/19/2017 (Originally 10/09/2010)  . INFLUENZA VACCINE  09/18/2017  . MAMMOGRAM  09/13/2018  . COLONOSCOPY  05/06/2021  . DEXA SCAN  Completed  . Hepatitis C Screening  Completed      Plan:   ***   I have personally reviewed and noted the following in the patient's chart:   . Medical and social history . Use of alcohol, tobacco or illicit drugs  . Current medications and supplements . Functional ability and status . Nutritional status . Physical activity . Advanced directives . List of other physicians . Hospitalizations, surgeries, and ER visits in previous 12 months . Vitals . Screenings to include cognitive, depression, and falls . Referrals and appointments  In addition, I have reviewed and discussed with patient certain preventive protocols, quality metrics, and best practice recommendations. A written personalized care plan for preventive services as well as general preventive health recommendations were provided to patient.     Shela Nevin, [place]  08/20/2017

## 2017-08-20 NOTE — Patient Instructions (Signed)
Rest, fluids , tylenol  For cough:  Take Mucinex DM twice a day as needed until better  For nasal congestion: Use OTC  Flonase : 2 nasal sprays on each side of the nose in the morning until you feel better Use ASTELIN a prescribed spray : 2 nasal sprays on each side of the nose twice a day until you feel better  Take zyrtec OTC once a day   Avoid decongestants such as  Pseudoephedrine or phenylephrine   Call if not gradually better over the next  10 days  Call anytime if the symptoms are severe

## 2017-08-20 NOTE — Progress Notes (Signed)
Pre visit review using our clinic review tool, if applicable. No additional management support is needed unless otherwise documented below in the visit note. 

## 2017-08-21 ENCOUNTER — Other Ambulatory Visit: Payer: Self-pay | Admitting: Family Medicine

## 2017-08-21 DIAGNOSIS — Z5181 Encounter for therapeutic drug level monitoring: Secondary | ICD-10-CM

## 2017-08-28 ENCOUNTER — Ambulatory Visit: Payer: Medicare Other | Admitting: *Deleted

## 2017-08-28 ENCOUNTER — Ambulatory Visit: Payer: Medicare Other | Admitting: Internal Medicine

## 2017-08-28 ENCOUNTER — Encounter

## 2017-08-29 ENCOUNTER — Ambulatory Visit (INDEPENDENT_AMBULATORY_CARE_PROVIDER_SITE_OTHER): Payer: Medicare Other | Admitting: Podiatry

## 2017-08-29 DIAGNOSIS — L600 Ingrowing nail: Secondary | ICD-10-CM

## 2017-08-29 DIAGNOSIS — Z9889 Other specified postprocedural states: Secondary | ICD-10-CM

## 2017-09-01 NOTE — Progress Notes (Signed)
Subjective: Stephanie Parks is a 72 y.o.  female returns to office today for follow up evaluation after having Right hallux medial nail avulsion performed. Patient has been soaking using epsom salts and applying topical antibiotic covered with bandaid daily.  She states that she is doing well she has not had any swelling or redness or any drainage or pus.  No pain.  Patient denies fevers, chills, nausea, vomiting. Denies any calf pain, chest pain, SOB.   Objective:  Vitals: Reviewed  General: Well developed, nourished, in no acute distress, alert and oriented x3   Dermatology: Skin is warm, dry and supple bilateral. Right hallux nail border appears to be clean, dry, with mild granular tissue and surrounding scab. There is no surrounding erythema, edema, drainage/purulence. The remaining nails appear unremarkable at this time. There are no other lesions or other signs of infection present.  Neurovascular status: Intact. No lower extremity swelling; No pain with calf compression bilateral.  Musculoskeletal: No tenderness to palpation of the medial hallux nail fold. Muscular strength within normal limits bilateral.   Assesement and Plan: S/p partial nail avulsion, doing well.   -Continue soaking in epsom salts twice a day followed by antibiotic ointment and a band-aid. Can leave uncovered at night. Continue this until completely healed.  -If the area has not healed in 2 weeks, call the office for follow-up appointment, or sooner if any problems arise.  -Monitor for any signs/symptoms of infection. Call the office immediately if any occur or go directly to the emergency room. Call with any questions/concerns.  Celesta Gentile, DPM

## 2017-09-03 ENCOUNTER — Ambulatory Visit: Payer: Medicare Other | Admitting: Physician Assistant

## 2017-09-11 ENCOUNTER — Other Ambulatory Visit: Payer: Self-pay | Admitting: Medical

## 2017-09-15 ENCOUNTER — Ambulatory Visit: Payer: Medicare Other | Admitting: Physician Assistant

## 2017-09-22 ENCOUNTER — Other Ambulatory Visit: Payer: Self-pay

## 2017-09-22 DIAGNOSIS — I10 Essential (primary) hypertension: Secondary | ICD-10-CM

## 2017-09-22 MED ORDER — LOSARTAN POTASSIUM-HCTZ 50-12.5 MG PO TABS: 1.0000 | ORAL_TABLET | Freq: Every day | ORAL | 1 refills | Status: DC

## 2017-10-02 ENCOUNTER — Encounter: Payer: Self-pay | Admitting: Medical

## 2017-10-02 ENCOUNTER — Ambulatory Visit (HOSPITAL_BASED_OUTPATIENT_CLINIC_OR_DEPARTMENT_OTHER)
Admission: RE | Admit: 2017-10-02 | Discharge: 2017-10-02 | Disposition: A | Discharge status: Home / Self Care | Payer: Medicare Other | Source: Ambulatory Visit | Attending: Medical | Admitting: Medical

## 2017-10-02 ENCOUNTER — Ambulatory Visit (INDEPENDENT_AMBULATORY_CARE_PROVIDER_SITE_OTHER): Payer: Medicare Other | Admitting: Medical

## 2017-10-02 ENCOUNTER — Telehealth: Payer: Self-pay | Admitting: Medical

## 2017-10-02 VITALS — BP 139/71 | HR 73 | Temp 98.0°F | Resp 16 | Ht 65.0 in | Wt 216.0 lb

## 2017-10-02 DIAGNOSIS — M25562 Pain in left knee: Secondary | ICD-10-CM | POA: Insufficient documentation

## 2017-10-02 DIAGNOSIS — M1712 Unilateral primary osteoarthritis, left knee: Secondary | ICD-10-CM | POA: Diagnosis not present

## 2017-10-02 LAB — CBC WITH DIFFERENTIAL/PLATELET
BASOS PCT: 1 % (ref 0.0–3.0)
Basophils Absolute: 0.1 10*3/uL (ref 0.0–0.1)
EOS PCT: 7.8 % — AB (ref 0.0–5.0)
Eosinophils Absolute: 0.5 10*3/uL (ref 0.0–0.7)
HEMATOCRIT: 33.3 % — AB (ref 36.0–46.0)
HEMOGLOBIN: 11 g/dL — AB (ref 12.0–15.0)
LYMPHS PCT: 36.4 % (ref 12.0–46.0)
Lymphs Abs: 2.4 10*3/uL (ref 0.7–4.0)
MCHC: 33.1 g/dL (ref 30.0–36.0)
MCV: 87.9 fl (ref 78.0–100.0)
MONOS PCT: 5.6 % (ref 3.0–12.0)
Monocytes Absolute: 0.4 10*3/uL (ref 0.1–1.0)
Neutro Abs: 3.2 10*3/uL (ref 1.4–7.7)
Neutrophils Relative %: 49.2 % (ref 43.0–77.0)
Platelets: 308 10*3/uL (ref 150.0–400.0)
RBC: 3.79 Mil/uL — AB (ref 3.87–5.11)
RDW: 15 % (ref 11.5–15.5)
WBC: 6.6 10*3/uL (ref 4.0–10.5)

## 2017-10-02 LAB — URIC ACID: URIC ACID, SERUM: 4.7 mg/dL (ref 2.4–7.0)

## 2017-10-02 MED ORDER — KETOROLAC TROMETHAMINE 30 MG/ML IJ SOLN
30.0000 mg | Freq: Once | INTRAMUSCULAR | Status: AC
  Administered 2017-10-02: 30 mg via INTRAMUSCULAR

## 2017-10-02 NOTE — Telephone Encounter (Signed)
Referral to orthopedist placed. 

## 2017-10-02 NOTE — Progress Notes (Signed)
Subjective:    Patient ID: Stephanie Parks, female    DOB: 17-Jun-1945, 72 y.o.   MRN: [id number]  HPI  Pt in with some knee pain that started couple of weeks ago. No fall or trauma.  Pt states no hip pain with left knee pain. No calf pain or popliteal pain reported.  Pt took some tylenol and it helped minimally for short time. On ambulation pain level is close to 10/10 per pt.    Review of Systems  Constitutional: Negative for chills, fatigue and fever.  Respiratory: Negative for apnea, cough, chest tightness and shortness of breath.   Cardiovascular: Negative for chest pain and palpitations.  Musculoskeletal:       Knee pain left.    Past Medical History:  Diagnosis Date  . GERD (gastroesophageal reflux disease)   . Hiatal hernia   . Hyperlipidemia   . Hypertension   . Migraine headache   . Schatzki's ring      Social History   Socioeconomic History  . Marital status: Divorced    Spouse name: Not on file  . Number of children: 2  . Years of education: Not on file  . Highest education level: Not on file  Occupational History  . Occupation: [employer]  . Financial resource strain: Not on file  . Food insecurity:    Worry: Not on file    Inability: Not on file  . Transportation needs:    Medical: Not on file    Non-medical: Not on file  Tobacco Use  . Smoking status: Former Smoker    Packs/day: 0.50    Years: 20.00    Pack years: 10.00    Types: Cigarettes    Last attempt to quit: 02/19/1984    Years since quitting: 33.6  . Smokeless tobacco: Never Used  Substance and Sexual Activity  . Alcohol use: No  . Drug use: No  . Sexual activity: Yes    Partners: Male  Lifestyle  . Physical activity:    Days per week: Not on file    Minutes per session: Not on file  . Stress: Not on file  Relationships  . Social connections:    Talks on phone: Not on file    Gets together: Not on file    Attends religious service: Not on file    Active member  of club or organization: Not on file    Attends meetings of clubs or organizations: Not on file    Relationship status: Not on file  . Intimate partner violence:    Fear of current or ex partner: Not on file    Emotionally abused: Not on file    Physically abused: Not on file    Forced sexual activity: Not on file  Other Topics Concern  . Not on file  Social History Narrative   Lives alone   Caffeine use: Coffee one cup daily   Right handed     Past Surgical History:  Procedure Laterality Date  . ABDOMINAL HYSTERECTOMY    . BACK SURGERY     x 3  . CARPAL TUNNEL RELEASE     left wrist  . CERVICAL SPINE SURGERY    . HEMORRHOID SURGERY      Family History  Problem Relation Age of Onset  . Allergic rhinitis Sister   . Diabetes Sister   . Hypertension Sister   . Heart attack Father 70  . Heart disease Father   . Stomach  cancer Maternal Grandmother   . Heart disease Mother   . Colon cancer Neg Hx   . Angioedema Neg Hx   . Asthma Neg Hx   . Eczema Neg Hx   . Urticaria Neg Hx   . Immunodeficiency Neg Hx   . Breast cancer Neg Hx     Allergies  Allergen Reactions  . Shellfish Allergy Hives and Swelling  . Ace Inhibitors Other (See Comments)  . Pitavastatin   . Sulfa Antibiotics Other (See Comments) and Rash    Fine bumps    Current Outpatient Medications on File Prior to Visit  Medication Sig Dispense Refill  . alendronate (FOSAMAX) 10 MG tablet TAKE 1 TABLET BY MOUTH DAILY BEFORE BREAKFAST. TAKE WITH A FULL GLASS OF WATER ON EMPTY STOMACH 90 tablet 1  . aspirin 81 MG tablet Take 81 mg by mouth daily.    Marland Kitchen azelastine (ASTELIN) 0.1 % nasal spray Place 2 sprays into both nostrils 2 (two) times daily as needed for rhinitis. Use in each nostril as directed 30 mL 5  . calcium carbonate (OS-CAL) 600 MG tablet Take 1 tablet (600 mg total) by mouth 2 (two) times daily with a meal. 60 tablet 0  . fluticasone (FLONASE) 50 MCG/ACT nasal spray USE 2 SPRAYS IN NOSTRIL(S) DAILY AS  NEEDED.  3  . hydrocortisone (ANUSOL-HC) 2.5 % rectal cream Place 1 application rectally 2 (two) times daily. 30 g 1  . hydrocortisone cream 0.5 % Apply 1 application topically 2 (two) times daily. Apply to a glycerin suppository and insert rectally 30 g 0  . KLOR-CON M20 20 MEQ tablet TAKE 1 TABLET BY MOUTH EVERY DAY 90 tablet 1  . losartan-hydrochlorothiazide (HYZAAR) 50-12.5 MG tablet Take 1 tablet by mouth daily. 90 tablet 1  . lovastatin (MEVACOR) 20 MG tablet Take 20 mg by mouth.    . Multiple Vitamins-Minerals (MULTIVITAL) tablet Take by mouth.    [name] [facility]  Onychomycosis Nail Lacquer -  Fluconazole 2%, Terbinafine 1% DMSO Apply to affected nail once daily Qty. 120 gm 3 refills    . NONFORMULARY OR COMPOUNDED ITEM Allergen immunotherapy (Patient taking differently: Allergy shots (new vial: shot given weekly for four weeks) Then once every four weeks.) 2 each 1  . nortriptyline (PAMELOR) 25 MG capsule Take 1 capsule (25 mg total) by mouth at bedtime. 90 capsule 1  . pantoprazole (PROTONIX) 40 MG tablet Take 1 tablet (40 mg total) by mouth daily. Take 30-60 minutes before breakfast. 30 tablet 2   No current facility-administered medications on file prior to visit.     BP 139/71   Pulse 73   Temp 98 F (36.7 C) (Oral)   Resp 16   Ht 5\' 5"  (1.651 m)   Wt 216 lb (98 kg)   SpO2 100%   BMI 35.94 kg/m       Objective:   Physical Exam   General- No acute distress. Pleasant patient. Neck- Full range of motion, no jvd Lungs- Clear, even and unlabored. Heart- regular rate and rhythm. Neurologic- CNII- XII grossly intact.   Left knee- mild crepitus on range of motion. Lateral tibial plateau region are tender on palpation. No obvious swelling.  Left lower ext- calf not swollen. No edema. Negative homan sign.  Left hip- no pain on palpation or movement.        Assessment & Plan:  For your recent knee pain will get xray, uric acid and cbc. We gave  you toradol 30  mg im. Then starting tomorrow can use low dose ibuprofen 200-600 mg every 8 hours. You can get over the counter 200 mg tabs. If your pain persists/severe let us know might consider tramadol.  Will up date you on xray results.  Follow up date to be determined after xray review.  Mackie Pai, PA-C

## 2017-10-02 NOTE — Patient Instructions (Signed)
For your recent knee pain will get xray, uric acid and cbc. We gave you toradol 30 mg im. Then starting tomorrow can use low dose ibuprofen 200-600 mg every 8 hours. You can get over the counter 200 mg tabs. If your pain persists/severe let us know might consider tramadol.  Will up date you on xray results.  Follow up date to be determined after xray review.

## 2017-10-07 ENCOUNTER — Ambulatory Visit (INDEPENDENT_AMBULATORY_CARE_PROVIDER_SITE_OTHER): Payer: Medicare Other | Admitting: Orthopaedic Surgery

## 2017-10-07 ENCOUNTER — Encounter (INDEPENDENT_AMBULATORY_CARE_PROVIDER_SITE_OTHER): Payer: Self-pay | Admitting: Orthopaedic Surgery

## 2017-10-07 VITALS — BP 141/61 | HR 83 | Ht 65.0 in | Wt 215.0 lb

## 2017-10-07 DIAGNOSIS — M1712 Unilateral primary osteoarthritis, left knee: Secondary | ICD-10-CM

## 2017-10-07 HISTORY — DX: Unilateral primary osteoarthritis, left knee: M17.12

## 2017-10-07 MED ORDER — BUPIVACAINE HCL 0.5 % IJ SOLN
2.0000 mL | INTRAMUSCULAR | Status: AC | PRN
  Administered 2017-10-07: 2 mL via INTRA_ARTICULAR

## 2017-10-07 MED ORDER — LIDOCAINE HCL 1 % IJ SOLN
2.0000 mL | INTRAMUSCULAR | Status: AC | PRN
  Administered 2017-10-07: 2 mL

## 2017-10-07 MED ORDER — METHYLPREDNISOLONE ACETATE 40 MG/ML IJ SUSP
80.0000 mg | INTRAMUSCULAR | Status: AC | PRN
  Administered 2017-10-07: 80 mg

## 2017-10-07 NOTE — Progress Notes (Signed)
Office Visit Note   Patient: Stephanie Parks           Date of Birth: 1945-04-09           MRN: [id number] Visit Date: 10/07/2017              Requested by: Mackie Pai, PA-C [address], [place] [address] PCP: [name], MD   Assessment & Plan: Visit Diagnoses:  1. Unilateral primary osteoarthritis, left knee     Plan: Primary advanced osteoarthritis left knee with secondary chondrocalcinosis.  Long discussion regarding diagnosis and treatment options.  We will try cortisone injection and monitor response.  Have discussed over-the-counter medicines, exercises and knee replacement as  options  Follow-Up Instructions: Return if symptoms worsen or fail to improve.   Orders:  Orders Placed This Encounter  Procedures  . Large Joint Inj: L knee   No orders of the defined types were placed in this encounter.     Procedures: Large Joint Inj: L knee on 10/07/2017 2:33 PM Indications: pain and diagnostic evaluation Details: 25 G 1.5 in needle, anteromedial approach  Arthrogram: No  Medications: 2 mL lidocaine 1 %; 2 mL bupivacaine 0.5 %; 80 mg methylPREDNISolone acetate 40 MG/ML Procedure, treatment alternatives, risks and benefits explained, specific risks discussed. Consent was given by the patient. Patient was prepped and draped in the usual sterile fashion.       Clinical Data: No additional findings.   Subjective: Chief Complaint  Patient presents with  . Left Knee - Pain  . New Patient (Initial Visit)    L KNEE PAIN FOR 2-3 WEEKS GETTING WORSE NO INJURIES REF FRPM PA EDWARD  Stephanie Parks is 72 years old accompanied by her husband and here for evaluation of left knee pain.  She was recently evaluated by her primary care physician for onset of knee pain without history of injury or trauma.  He did check her serum uric acid level which was normal.  She is been trying over-the-counter medicines but continues to have pain toward  compromise.  Denies any fever or chills.  She has not had any injury trauma as mentioned.  Pain is mostly along the medial aspect of her knee.  No referred pain from her back or thigh.  In addition to Tylenol she is tried low-dose ibuprofen.  HPI  Review of Systems  Constitutional: Positive for fatigue. Negative for fever.  HENT: Negative for ear pain.   Eyes: Negative for pain.  Respiratory: Negative for cough and shortness of breath.   Cardiovascular: Positive for leg swelling.  Gastrointestinal: Positive for diarrhea. Negative for constipation.  Genitourinary: Negative for difficulty urinating.  Musculoskeletal: Negative for back pain and neck pain.  Skin: Negative for rash.  Allergic/Immunologic: Positive for food allergies.  Neurological: Positive for weakness. Negative for numbness.  Hematological: Does not bruise/bleed easily.  Psychiatric/Behavioral: Negative for sleep disturbance.     Objective: Vital Signs: BP (!) 141/61 (BP Location: Right Arm, Patient Position: Sitting, Cuff Size: Normal)   Pulse 83   Ht 5\' 5"  (1.651 m)   Wt 215 lb (97.5 kg)   BMI 35.78 kg/m   Physical Exam  Constitutional: She is oriented to person, place, and time. She appears well-developed and well-nourished.  HENT:  Mouth/Throat: Oropharynx is clear and moist.  Eyes: Pupils are equal, round, and reactive to light. EOM are normal.  Pulmonary/Chest: Effort normal.  Neurological: She is alert and oriented to person, place, and  time.  Skin: Skin is warm and dry.  Psychiatric: She has a normal mood and affect. Her behavior is normal.    Ortho Exam awake alert and oriented x3.  Comfortable sitting.  Definite limp referable to her left knee.  No pain with range of motion of left hip.  Straight leg raise negative.  Diffuse mild to moderate medial joint pain left knee.  Minimal effusion.  No lateral joint pain.  Full extension and flexion about 100 degrees.  No instability.  No popliteal mass.  No calf  discomfort.  No distal edema.  Specialty Comments:  No specialty comments available.  Imaging: No results found.   PMFS History: Patient Active Problem List   Diagnosis Date Noted  . Unilateral primary osteoarthritis, left knee 10/07/2017  . Ingrown toenail 08/10/2017  . Coughing 04/24/2017  . 3-vessel CAD 03/01/2015  . Aneurysm ([place]) 03/01/2015  . Dizziness 03/01/2015  . Hypercholesterolemia 03/01/2015  . Decreased potassium in the blood 03/01/2015  . Paresthesia of arm 03/01/2015  . Allergic rhinitis 03/01/2015  . Achalasia 09/28/2012  . CN (constipation) 09/28/2012  . Cephalalgia 08/24/2012  . HTN (hypertension) 04/19/2011  . Hyperlipidemia 04/19/2011  . GERD (gastroesophageal reflux disease) 04/19/2011  . Chest pain 04/04/2011   Past Medical History:  Diagnosis Date  . GERD (gastroesophageal reflux disease)   . Hiatal hernia   . Hyperlipidemia   . Hypertension   . Migraine headache   . Schatzki's ring     Family History  Problem Relation Age of Onset  . Allergic rhinitis Sister   . Diabetes Sister   . Hypertension Sister   . Heart attack Father 27  . Heart disease Father   . Stomach cancer Maternal Grandmother   . Heart disease Mother   . Colon cancer Neg Hx   . Angioedema Neg Hx   . Asthma Neg Hx   . Eczema Neg Hx   . Urticaria Neg Hx   . Immunodeficiency Neg Hx   . Breast cancer Neg Hx     Past Surgical History:  Procedure Laterality Date  . ABDOMINAL HYSTERECTOMY    . BACK SURGERY     x 3  . CARPAL TUNNEL RELEASE     left wrist  . CERVICAL SPINE SURGERY    . HEMORRHOID SURGERY     Social History   Occupational History  . Occupation: [employer]   Tobacco Use  . Smoking status: Former Smoker    Packs/day: 0.50    Years: 20.00    Pack years: 10.00    Types: Cigarettes    Last attempt to quit: 02/19/1984    Years since quitting: 33.6  . Smokeless tobacco: Never Used  Substance and Sexual Activity  . Alcohol use: No  . Drug use: No  .  Sexual activity: Yes    Partners: Male

## 2017-10-14 ENCOUNTER — Other Ambulatory Visit: Payer: Self-pay | Admitting: Family Medicine

## 2017-10-16 ENCOUNTER — Other Ambulatory Visit: Payer: Self-pay | Admitting: Family Medicine

## 2017-10-16 DIAGNOSIS — Z1231 Encounter for screening mammogram for malignant neoplasm of breast: Secondary | ICD-10-CM

## 2017-10-21 ENCOUNTER — Encounter (HOSPITAL_BASED_OUTPATIENT_CLINIC_OR_DEPARTMENT_OTHER): Payer: Self-pay

## 2017-10-21 ENCOUNTER — Ambulatory Visit (HOSPITAL_BASED_OUTPATIENT_CLINIC_OR_DEPARTMENT_OTHER)
Admission: RE | Admit: 2017-10-21 | Discharge: 2017-10-21 | Disposition: A | Discharge status: Home / Self Care | Payer: Medicare Other | Source: Ambulatory Visit | Attending: Family Medicine | Admitting: Family Medicine

## 2017-10-21 DIAGNOSIS — Z1231 Encounter for screening mammogram for malignant neoplasm of breast: Secondary | ICD-10-CM | POA: Insufficient documentation

## 2017-10-30 ENCOUNTER — Other Ambulatory Visit: Payer: Self-pay | Admitting: Emergency Medicine

## 2017-10-30 DIAGNOSIS — J3089 Other allergic rhinitis: Secondary | ICD-10-CM | POA: Diagnosis not present

## 2017-10-30 MED ORDER — PANTOPRAZOLE SODIUM 40 MG PO TBEC: 40.0000 mg | DELAYED_RELEASE_TABLET | Freq: Every day | ORAL | 2 refills | Status: DC

## 2017-10-30 NOTE — Progress Notes (Signed)
Vial exp 10-31-18

## 2017-11-04 ENCOUNTER — Ambulatory Visit (INDEPENDENT_AMBULATORY_CARE_PROVIDER_SITE_OTHER): Payer: Medicare Other

## 2017-11-04 ENCOUNTER — Ambulatory Visit: Payer: Medicare Other | Admitting: Podiatry

## 2017-11-04 DIAGNOSIS — M79671 Pain in right foot: Secondary | ICD-10-CM

## 2017-11-04 DIAGNOSIS — M779 Enthesopathy, unspecified: Secondary | ICD-10-CM

## 2017-11-04 DIAGNOSIS — M7661 Achilles tendinitis, right leg: Secondary | ICD-10-CM | POA: Diagnosis not present

## 2017-11-04 MED ORDER — METHYLPREDNISOLONE 4 MG PO TBPK: ORAL_TABLET | ORAL | 0 refills | Status: DC

## 2017-11-05 NOTE — Progress Notes (Signed)
Subjective: 72 year old female presents the office today for acute pain to the back of the right heel which started last night after she fell off of work.  She works as a Scientist, water quality at [employer].  She states that she is noticed some swelling to the area and she is the difficulty walking at times given the pain.  She denies any recent injury or fall.  She denies any change in activity level which she was.  She said no recent treatment.  She has no other concerns today. Denies any systemic complaints such as fevers, chills, nausea, vomiting. No acute changes since last appointment, and no other complaints at this time.   Objective: AAO x3, NAD DP/PT pulses palpable bilaterally, CRT less than 3 seconds There is tenderness to the posterior aspect of the right calcaneal insertion of the Achilles tendon.  Thompson test is negative and the Achilles tendon appears to be intact.  There is localized edema to the posterior heel compared to contralateral extremity.  There is no pain with lateral compression of the calcaneus.  Plantar fascia appears to be intact.  No other areas of tenderness. No open lesions or pre-ulcerative lesions.  No pain with calf compression, swelling, warmth, erythema  Assessment: Acute insertional Achilles tendon pain right   Plan: -All treatment options discussed with the patient including all alternatives, risks, complications.  -X-rays were obtained reviewed.  On the lateral view there is some radiolucency in the posterior portion of the calcaneus there are some calcifications in the distal portion of the Achilles tendon. -Given her pain and x-ray findings I do recommend immobilization in the cam boot.  Ice the area daily.  Prescribed a Medrol Dosepak which has had before with no issues. -Discussed with her we can provide a note for her to wear the boot at work as well as allow her to sit while working as a Scientist, water quality. -Patient encouraged to call the office with any questions, concerns,  change in symptoms.   Return in about 2 weeks (around 11/18/2017).  Trula Slade DPM

## 2017-11-10 ENCOUNTER — Telehealth: Payer: Self-pay | Admitting: Podiatry

## 2017-11-10 ENCOUNTER — Encounter: Payer: Self-pay | Admitting: *Deleted

## 2017-11-10 NOTE — Telephone Encounter (Signed)
Left message informing pt the note could be written stating she was to wear the boot while at work and would be reevaluated 11/21/2017, and I informed that we did not have a enclosed boot, but did have shield that fit over the toe of the boot if that was what she would like.

## 2017-11-10 NOTE — Telephone Encounter (Signed)
Pt needs a note for work to be able to wear a boot to work. She is also requesting a closed toe boot due to they type of work she does. Please give her a call.

## 2017-11-11 NOTE — Telephone Encounter (Signed)
Pt called states she would be able to come in tomorrow to pick up the letter and evaluate whether the shield would be acceptable to her employer.

## 2017-11-12 ENCOUNTER — Ambulatory Visit (HOSPITAL_BASED_OUTPATIENT_CLINIC_OR_DEPARTMENT_OTHER)
Admission: RE | Admit: 2017-11-12 | Discharge: 2017-11-12 | Disposition: A | Discharge status: Home / Self Care | Payer: Medicare Other | Source: Ambulatory Visit | Attending: Medical | Admitting: Medical

## 2017-11-12 ENCOUNTER — Telehealth: Payer: Self-pay | Admitting: Family Medicine

## 2017-11-12 ENCOUNTER — Encounter: Payer: Self-pay | Admitting: Medical

## 2017-11-12 ENCOUNTER — Ambulatory Visit: Payer: Self-pay

## 2017-11-12 ENCOUNTER — Ambulatory Visit (INDEPENDENT_AMBULATORY_CARE_PROVIDER_SITE_OTHER): Payer: Medicare Other | Admitting: Medical

## 2017-11-12 VITALS — BP 128/53 | HR 91 | Temp 98.1°F | Resp 16 | Ht 65.0 in | Wt 217.4 lb

## 2017-11-12 DIAGNOSIS — J40 Bronchitis, not specified as acute or chronic: Secondary | ICD-10-CM | POA: Diagnosis not present

## 2017-11-12 DIAGNOSIS — R0989 Other specified symptoms and signs involving the circulatory and respiratory systems: Secondary | ICD-10-CM | POA: Insufficient documentation

## 2017-11-12 DIAGNOSIS — R059 Cough, unspecified: Secondary | ICD-10-CM

## 2017-11-12 DIAGNOSIS — J0100 Acute maxillary sinusitis, unspecified: Secondary | ICD-10-CM

## 2017-11-12 DIAGNOSIS — R05 Cough: Secondary | ICD-10-CM | POA: Diagnosis not present

## 2017-11-12 DIAGNOSIS — R0789 Other chest pain: Secondary | ICD-10-CM

## 2017-11-12 LAB — CBC WITH DIFFERENTIAL/PLATELET
BASOS ABS: 0 10*3/uL (ref 0.0–0.1)
Basophils Relative: 0.4 % (ref 0.0–3.0)
EOS PCT: 5.6 % — AB (ref 0.0–5.0)
Eosinophils Absolute: 0.5 10*3/uL (ref 0.0–0.7)
HEMATOCRIT: 34 % — AB (ref 36.0–46.0)
Hemoglobin: 11.2 g/dL — ABNORMAL LOW (ref 12.0–15.0)
LYMPHS PCT: 35.5 % (ref 12.0–46.0)
Lymphs Abs: 3 10*3/uL (ref 0.7–4.0)
MCHC: 33 g/dL (ref 30.0–36.0)
MCV: 87.4 fl (ref 78.0–100.0)
MONOS PCT: 5.5 % (ref 3.0–12.0)
Monocytes Absolute: 0.5 10*3/uL (ref 0.1–1.0)
Neutro Abs: 4.4 10*3/uL (ref 1.4–7.7)
Neutrophils Relative %: 53 % (ref 43.0–77.0)
PLATELETS: 357 10*3/uL (ref 150.0–400.0)
RBC: 3.89 Mil/uL (ref 3.87–5.11)
RDW: 14.4 % (ref 11.5–15.5)
WBC: 8.3 10*3/uL (ref 4.0–10.5)

## 2017-11-12 LAB — TROPONIN I: TNIDX: 0 ug/l (ref 0.00–0.06)

## 2017-11-12 MED ORDER — BENZONATATE 100 MG PO CAPS: 100.0000 mg | ORAL_CAPSULE | Freq: Three times a day (TID) | ORAL | 0 refills | Status: DC | PRN

## 2017-11-12 MED ORDER — FLUTICASONE PROPIONATE 50 MCG/ACT NA SUSP: 2.0000 | Freq: Every day | NASAL | 1 refills | Status: DC

## 2017-11-12 MED ORDER — ALBUTEROL SULFATE HFA 108 (90 BASE) MCG/ACT IN AERS: 2.0000 | INHALATION_SPRAY | Freq: Four times a day (QID) | RESPIRATORY_TRACT | 2 refills | Status: DC | PRN

## 2017-11-12 MED ORDER — DOXYCYCLINE HYCLATE 100 MG PO TABS: 100.0000 mg | ORAL_TABLET | Freq: Two times a day (BID) | ORAL | 0 refills | Status: DC

## 2017-11-12 NOTE — Telephone Encounter (Signed)
Copied from [place] [phone]. Topic: Quick Communication - See Telephone Encounter >> Nov 12, 2017  5:39 PM Ivar Drape wrote: CRM for notification. See Telephone encounter for: 11/12/17. Patient was in to see Percell Miller today and was told to call the office if she couldn't get any of the medications he prescribed.  She wanted him to know she could not get albuterol (PROVENTIL HFA;VENTOLIN HFA) 108 (90 Base) MCG/ACT inhaler and the doxycycline (VIBRA-TABS) 100 MG tablet medications.  Please advise.

## 2017-11-12 NOTE — Patient Instructions (Addendum)
You appear to have bronchitis and sinusitis. Rest hydrate and tylenol for fever. I am prescribing cough medicine benzonatate, and doxycycline antibiotic. For your nasal congestion you could use otc the counter nasal steroid flonase.  For wheezing albuterol inhalers. If using frequently then please notify us.  Cxr today.  Your atypical chest pain is resolved and has been for 7 or more hours. ekg showed normal sinus rhythm(no change since prior EKG in 2018). Will get troponin stat one set as caution. If atypical chest pain were to return then would need ED evaluation. In that event repeat ekg and repeat labs  Follow up in 7-10 days or as needed

## 2017-11-12 NOTE — Progress Notes (Signed)
Subjective:    Patient ID: Stephanie Parks, female    DOB: 03-09-1945, 72 y.o.   MRN: [id number]  HPI Pt had one week of nasal and chest congestion. Had a lot of runny nose at onset. Some sinus pressure and some productive cough. Gradually getting worse over past. Some possible fever. Some mild wheezing.  Pt also mentions 3 am this am night some lateral chest pain for about 1-2 minutes some arm pain as well. Pain resolved quickly and is no longer present. No jaw pain, no nausea, no vomiting, and no back pain.  Remote hx of smoking. Stopped smoking in 1986.   Review of Systems  Constitutional: Positive for fever. Negative for chills and fatigue.       Possible subjective fever.  HENT: Positive for congestion and ear pain. Negative for postnasal drip, sinus pressure, sinus pain and sneezing.   Respiratory: Positive for cough and wheezing.        Mild occasional wheeze.  Cardiovascular: Negative for chest pain and palpitations.       None now. See hpi.  Gastrointestinal: Negative for abdominal pain, constipation, rectal pain and vomiting.  Musculoskeletal: Negative for back pain.  Skin: Negative for rash.  Neurological: Negative for dizziness, numbness and headaches.  Hematological: Negative for adenopathy. Does not bruise/bleed easily.  Psychiatric/Behavioral: Negative for behavioral problems, confusion and suicidal ideas. The patient is not nervous/anxious.     Past Medical History:  Diagnosis Date  . GERD (gastroesophageal reflux disease)   . Hiatal hernia   . Hyperlipidemia   . Hypertension   . Migraine headache   . Schatzki's ring      Social History   Socioeconomic History  . Marital status: Divorced    Spouse name: Not on file  . Number of children: 2  . Years of education: Not on file  . Highest education level: Not on file  Occupational History  . Occupation: [employer]  . Financial resource strain: Not on file  . Food insecurity:    Worry: Not  on file    Inability: Not on file  . Transportation needs:    Medical: Not on file    Non-medical: Not on file  Tobacco Use  . Smoking status: Former Smoker    Packs/day: 0.50    Years: 20.00    Pack years: 10.00    Types: Cigarettes    Last attempt to quit: 02/19/1984    Years since quitting: 33.7  . Smokeless tobacco: Never Used  Substance and Sexual Activity  . Alcohol use: No  . Drug use: No  . Sexual activity: Yes    Partners: Male  Lifestyle  . Physical activity:    Days per week: Not on file    Minutes per session: Not on file  . Stress: Not on file  Relationships  . Social connections:    Talks on phone: Not on file    Gets together: Not on file    Attends religious service: Not on file    Active member of club or organization: Not on file    Attends meetings of clubs or organizations: Not on file    Relationship status: Not on file  . Intimate partner violence:    Fear of current or ex partner: Not on file    Emotionally abused: Not on file    Physically abused: Not on file    Forced sexual activity: Not on file  Other Topics Concern  .  Not on file  Social History Narrative   Lives alone   Caffeine use: Coffee one cup daily   Right handed     Past Surgical History:  Procedure Laterality Date  . ABDOMINAL HYSTERECTOMY    . BACK SURGERY     x 3  . CARPAL TUNNEL RELEASE     left wrist  . CERVICAL SPINE SURGERY    . HEMORRHOID SURGERY      Family History  Problem Relation Age of Onset  . Allergic rhinitis Sister   . Diabetes Sister   . Hypertension Sister   . Heart attack Father 41  . Heart disease Father   . Stomach cancer Maternal Grandmother   . Heart disease Mother   . Colon cancer Neg Hx   . Angioedema Neg Hx   . Asthma Neg Hx   . Eczema Neg Hx   . Urticaria Neg Hx   . Immunodeficiency Neg Hx   . Breast cancer Neg Hx     Allergies  Allergen Reactions  . Shellfish Allergy Hives and Swelling  . Ace Inhibitors Other (See Comments)  .  Pitavastatin   . Sulfa Antibiotics Other (See Comments) and Rash    Fine bumps    Current Outpatient Medications on File Prior to Visit  Medication Sig Dispense Refill  . alendronate (FOSAMAX) 10 MG tablet TAKE 1 TABLET BY MOUTH DAILY BEFORE BREAKFAST. TAKE WITH A FULL GLASS OF WATER ON EMPTY STOMACH 90 tablet 1  . aspirin 81 MG tablet Take 81 mg by mouth daily.    Marland Kitchen azelastine (ASTELIN) 0.1 % nasal spray Place 2 sprays into both nostrils 2 (two) times daily as needed for rhinitis. Use in each nostril as directed 30 mL 5  . calcium carbonate (OS-CAL) 600 MG tablet Take 1 tablet (600 mg total) by mouth 2 (two) times daily with a meal. 60 tablet 0  . fluticasone (FLONASE) 50 MCG/ACT nasal spray USE 2 SPRAYS IN NOSTRIL(S) DAILY AS NEEDED.  3  . hydrocortisone (ANUSOL-HC) 2.5 % rectal cream Place 1 application rectally 2 (two) times daily. 30 g 1  . hydrocortisone cream 0.5 % Apply 1 application topically 2 (two) times daily. Apply to a glycerin suppository and insert rectally 30 g 0  . KLOR-CON M20 20 MEQ tablet TAKE 1 TABLET BY MOUTH EVERY DAY 90 tablet 1  . losartan-hydrochlorothiazide (HYZAAR) 50-12.5 MG tablet Take 1 tablet by mouth daily. 90 tablet 1  . lovastatin (MEVACOR) 20 MG tablet Take 20 mg by mouth.    . methylPREDNISolone (MEDROL DOSEPAK) 4 MG TBPK tablet Take as directed 21 tablet 0  . Multiple Vitamins-Minerals (MULTIVITAL) tablet Take by mouth.    [name] FORMUL[facility]  Onychomycosis Nail Lacquer -  Fluconazole 2%, Terbinafine 1% DMSO Apply to affected nail once daily Qty. 120 gm 3 refills    . NONFORMULARY OR COMPOUNDED ITEM Allergen immunotherapy (Patient taking differently: Allergy shots (new vial: shot given weekly for four weeks) Then once every four weeks.) 2 each 1  . nortriptyline (PAMELOR) 25 MG capsule TAKE 1 CAPSULE (25 MG TOTAL) BY MOUTH AT BEDTIME. 90 capsule 1  . pantoprazole (PROTONIX) 40 MG tablet Take 1 tablet (40 mg total) by mouth daily. Take  30-60 minutes before breakfast. 90 tablet 2   No current facility-administered medications on file prior to visit.     BP (!) 128/53   Pulse 91   Temp 98.1 F (36.7 C) (Oral)   Resp 16  Ht 5\' 5"  (1.651 m)   Wt 217 lb 6.4 oz (98.6 kg)   SpO2 99%   BMI 36.18 kg/m       Objective:   Physical Exam  General  Mental Status - Alert. General Appearance - Well groomed. Not in acute distress.  Skin Rashes- No Rashes.  HEENT Head- Normal. Ear Auditory Canal - Left- Normal. Right - Normal.Tympanic Membrane- Left- Normal. Right- Normal. Eye Sclera/Conjunctiva- Left- Normal. Right- Normal. Nose & Sinuses Nasal Mucosa- Left-  Boggy and Congested. Right-  Boggy and  Congested.Bilateral maxillary and frontal sinus pressure. Mouth & Throat Lips: Upper Lip- Normal: no dryness, cracking, pallor, cyanosis, or vesicular eruption. Lower Lip-Normal: no dryness, cracking, pallor, cyanosis or vesicular eruption. Buccal Mucosa- Bilateral- No Aphthous ulcers. Oropharynx- No Discharge or Erythema. Tonsils: Characteristics- Bilateral- No Erythema or Congestion. Size/Enlargement- Bilateral- No enlargement. Discharge- bilateral-None.  Neck Neck- Supple. No Masses.   Chest and Lung Exam Auscultation: Breath Sounds:-Clear even and unlabored mild shallow.  Cardiovascular Auscultation:Rythm- Regular, rate and rhythm. Murmurs & Other Heart Sounds:Ausculatation of the heart reveal- No Murmurs.  Lymphatic Head & Neck General Head & Neck Lymphatics: Bilateral: Description- No Localized lymphadenopathy.  Lower ext- no pedal edema. Negative homans sign bilaterally.  Left side ribs and anterior thorax- no tenderness to palpation. Left upper ext- no tenderness to palpation. No swelling and no pain on range of motion       Assessment & Plan:   You appear to have bronchitis and sinusitis. Rest hydrate and tylenol for fever. I am prescribing cough medicine benzonatate, and doxycycline  antibiotic(rx advisement given). For your nasal congestion you could use otc the counter nasal steroid flonase.  For wheezing albuterol inhalers. If using frequently then please notify us.  Cxr today.  Your atypical chest pain is resolved and has been for 7 or more hours. ekg showed normal sinus rhythm. Will get troponin stat one set as caution. If atypical chest pain were to return then would need ED evaluation.(pt expressed understanding)  Follow up in 7-10 days or as needed  [employer], PA-C

## 2017-11-13 MED ORDER — AZITHROMYCIN 250 MG PO TABS: ORAL_TABLET | ORAL | 0 refills | Status: DC

## 2017-11-13 MED ORDER — PREDNISONE 10 MG PO TABS: 10.0000 mg | ORAL_TABLET | Freq: Every day | ORAL | 0 refills | Status: DC

## 2017-11-13 NOTE — Telephone Encounter (Signed)
I sent in azithromycin instead of doxy. Did they explain why she could not get. I rx'd generic version. Also what was issue with albuterol? Usually all inhalers can be expensive. So can call in prednisone 21 tab taper dose to her pharmacy. Explain to pt only use if needed for more severe wheezing. Sig is 6 day 1, 5 day 2, 4 day 3, 3 day 4, 2 day 5 and 1 day 6.

## 2017-11-13 NOTE — Addendum Note (Signed)
Addended by: Anabel Halon on: 11/13/2017 08:38 AM   Modules accepted: Orders

## 2017-11-13 NOTE — Addendum Note (Signed)
Addended by: Hinton Dyer on: 11/13/2017 02:12 PM   Modules accepted: Orders

## 2017-11-16 ENCOUNTER — Emergency Department (HOSPITAL_BASED_OUTPATIENT_CLINIC_OR_DEPARTMENT_OTHER)
Admission: EM | Admit: 2017-11-16 | Discharge: 2017-11-16 | Disposition: A | Discharge status: Home / Self Care | Payer: Medicare Other | Attending: Emergency Medicine | Admitting: Emergency Medicine

## 2017-11-16 ENCOUNTER — Encounter (HOSPITAL_BASED_OUTPATIENT_CLINIC_OR_DEPARTMENT_OTHER): Payer: Self-pay | Admitting: Emergency Medicine

## 2017-11-16 ENCOUNTER — Other Ambulatory Visit: Payer: Self-pay

## 2017-11-16 DIAGNOSIS — Z7982 Long term (current) use of aspirin: Secondary | ICD-10-CM | POA: Diagnosis not present

## 2017-11-16 DIAGNOSIS — Z87891 Personal history of nicotine dependence: Secondary | ICD-10-CM | POA: Insufficient documentation

## 2017-11-16 DIAGNOSIS — I10 Essential (primary) hypertension: Secondary | ICD-10-CM | POA: Diagnosis not present

## 2017-11-16 DIAGNOSIS — R197 Diarrhea, unspecified: Secondary | ICD-10-CM | POA: Diagnosis present

## 2017-11-16 DIAGNOSIS — Z79899 Other long term (current) drug therapy: Secondary | ICD-10-CM | POA: Diagnosis not present

## 2017-11-16 DIAGNOSIS — K529 Noninfective gastroenteritis and colitis, unspecified: Secondary | ICD-10-CM | POA: Insufficient documentation

## 2017-11-16 LAB — CBC
HCT: 36.6 % (ref 36.0–46.0)
HEMOGLOBIN: 12.1 g/dL (ref 12.0–15.0)
MCH: 29 pg (ref 26.0–34.0)
MCHC: 33.1 g/dL (ref 30.0–36.0)
MCV: 87.8 fL (ref 78.0–100.0)
Platelets: 353 10*3/uL (ref 150–400)
RBC: 4.17 MIL/uL (ref 3.87–5.11)
RDW: 14.8 % (ref 11.5–15.5)
WBC: 12.7 10*3/uL — AB (ref 4.0–10.5)

## 2017-11-16 LAB — BASIC METABOLIC PANEL
ANION GAP: 11 (ref 5–15)
BUN: 11 mg/dL (ref 8–23)
CHLORIDE: 98 mmol/L (ref 98–111)
CO2: 31 mmol/L (ref 22–32)
Calcium: 9.4 mg/dL (ref 8.9–10.3)
Creatinine, Ser: 0.89 mg/dL (ref 0.44–1.00)
GFR calc non Af Amer: >60 mL/min (ref >60)
Glucose, Bld: 101 mg/dL — ABNORMAL HIGH (ref 70–99)
POTASSIUM: 3.5 mmol/L (ref 3.5–5.1)
Sodium: 140 mmol/L (ref 135–145)

## 2017-11-16 MED ORDER — METRONIDAZOLE 500 MG PO TABS: 500.0000 mg | ORAL_TABLET | Freq: Two times a day (BID) | ORAL | 0 refills | Status: DC

## 2017-11-16 MED ORDER — METRONIDAZOLE 500 MG PO TABS
500.0000 mg | ORAL_TABLET | Freq: Once | ORAL | Status: AC
  Administered 2017-11-16: 500 mg via ORAL
  Filled 2017-11-16: qty 1

## 2017-11-16 NOTE — ED Notes (Signed)
In room for EDP to do rectal exam. Very faint amount of gross red blood on exam.  Occult card not sent to lab per EDP

## 2017-11-16 NOTE — ED Triage Notes (Signed)
Patient states that she started to have frequent loose stools last night. She states that she has gone all night long with frequent loose stools. Patient states that she is weak  - denies any Lightheaded or dizziness

## 2017-11-17 NOTE — ED Provider Notes (Signed)
[place] EMERGENCY DEPARTMENT Provider Note   CSN: [id number] Arrival date & time: 11/16/17  1125     History   Chief Complaint Chief Complaint  Patient presents with  . Rectal Bleeding    HPI Stephanie Parks is a 72 y.o. female.  HPI Patient is a 72 year old female presents emergency department with diarrhea which began last night.  She had diarrhea throughout the night and this morning with a small amount of blood in the toilet and on the tissue.  Stool remained brown.  Reports some crampy lower abdominal discomfort without urinary symptoms.  No fevers or chills.  No recent sick contacts.  Denies nausea vomiting.  She reports mild weakness generally.   Past Medical History:  Diagnosis Date  . GERD (gastroesophageal reflux disease)   . Hiatal hernia   . Hyperlipidemia   . Hypertension   . Migraine headache   . Schatzki's ring     Patient Active Problem List   Diagnosis Date Noted  . Unilateral primary osteoarthritis, left knee 10/07/2017  . Ingrown toenail 08/10/2017  . Coughing 04/24/2017  . 3-vessel CAD 03/01/2015  . Aneurysm ([place]) 03/01/2015  . Dizziness 03/01/2015  . Hypercholesterolemia 03/01/2015  . Decreased potassium in the blood 03/01/2015  . Paresthesia of arm 03/01/2015  . Allergic rhinitis 03/01/2015  . Achalasia 09/28/2012  . CN (constipation) 09/28/2012  . Cephalalgia 08/24/2012  . HTN (hypertension) 04/19/2011  . Hyperlipidemia 04/19/2011  . GERD (gastroesophageal reflux disease) 04/19/2011  . Chest pain 04/04/2011    Past Surgical History:  Procedure Laterality Date  . ABDOMINAL HYSTERECTOMY    . BACK SURGERY     x 3  . CARPAL TUNNEL RELEASE     left wrist  . CERVICAL SPINE SURGERY    . HEMORRHOID SURGERY       OB History   None      Home Medications    Prior to Admission medications   Medication Sig Start Date End Date Taking? Authorizing Provider  albuterol (PROVENTIL HFA;VENTOLIN HFA) 108 (90 Base) MCG/ACT  inhaler Inhale 2 puffs into the lungs every 6 (six) hours as needed for wheezing or shortness of breath. 11/12/17   Saguier, Percell Miller, PA-C  alendronate (FOSAMAX) 10 MG tablet TAKE 1 TABLET BY MOUTH DAILY BEFORE BREAKFAST. TAKE WITH A FULL GLASS OF WATER ON EMPTY STOMACH 09/11/17   Copland, Gay Filler, MD  aspirin 81 MG tablet Take 81 mg by mouth daily.    [provider]  azelastine (ASTELIN) 0.1 % nasal spray Place 2 sprays into both nostrils 2 (two) times daily as needed for rhinitis. Use in each nostril as directed 04/24/17   Bobbitt, Sedalia Muta, MD  azithromycin (ZITHROMAX) 250 MG tablet Take 2 tablets by mouth on day 1, followed by 1 tablet by mouth daily for 4 days. 11/13/17   Saguier, Percell Miller, PA-C  benzonatate (TESSALON) 100 MG capsule Take 1 capsule (100 mg total) by mouth 3 (three) times daily as needed for cough. 11/12/17   Saguier, Percell Miller, PA-C  calcium carbonate (OS-CAL) 600 MG tablet Take 1 tablet (600 mg total) by mouth 2 (two) times daily with a meal. 09/20/16   Saguier, Percell Miller, PA-C  fluticasone [facility]) 50 MCG/ACT nasal spray USE 2 SPRAYS IN NOSTRIL(S) DAILY AS NEEDED. 02/21/15   [provider]  fluticasone (FLONASE) 50 MCG/ACT nasal spray Place 2 sprays into both nostrils daily. 11/12/17   Saguier, Percell Miller, PA-C  hydrocortisone (ANUSOL-HC) 2.5 % rectal cream Place 1 application rectally 2 (  two) times daily. 08/15/17   Levin Erp, PA  hydrocortisone cream 0.5 % Apply 1 application topically 2 (two) times daily. Apply to a glycerin suppository and insert rectally 08/14/17   Levin Erp, PA  KLOR-CON M20 20 MEQ tablet TAKE 1 TABLET BY MOUTH EVERY DAY 08/22/17   Copland, Gay Filler, MD  losartan-hydrochlorothiazide (HYZAAR) 50-12.5 MG tablet Take 1 tablet by mouth daily. 09/22/17   Copland, Gay Filler, MD  lovastatin (MEVACOR) 20 MG tablet Take 20 mg by mouth.    [provider]  methylPREDNISolone (MEDROL DOSEPAK) 4 MG TBPK tablet Take as directed 11/04/17    Trula Slade, DPM  metroNIDAZOLE (FLAGYL) 500 MG tablet Take 1 tablet (500 mg total) by mouth 2 (two) times daily. 11/16/17   Jola Schmidt, MD  Multiple Vitamins-Minerals (MULTIVITAL) tablet Take by mouth.    [provider]  NON FORMULARY [facility]  Onychomycosis Nail Lacquer -  Fluconazole 2%, Terbinafine 1% DMSO Apply to affected nail once daily Qty. 120 gm 3 refills    [provider]  NONFORMULARY OR COMPOUNDED ITEM Allergen immunotherapy Patient taking differently: Allergy shots (new vial: shot given weekly for four weeks) Then once every four weeks. 03/01/15   Leda Roys, MD  nortriptyline (PAMELOR) 25 MG capsule TAKE 1 CAPSULE (25 MG TOTAL) BY MOUTH AT BEDTIME. 10/14/17   Copland, Gay Filler, MD  pantoprazole (PROTONIX) 40 MG tablet Take 1 tablet (40 mg total) by mouth daily. Take 30-60 minutes before breakfast. 10/30/17   Lemmon, Lavone Nian, PA  predniSONE (DELTASONE) 10 MG tablet Take 1 tablet (10 mg total) by mouth daily with breakfast. 6 day 1, 5 day 2, 4 day 3, 3 day 4, 2 day 5 and 1 day 6. 11/13/17   Saguier, Percell Miller, PA-C    Family History Family History  Problem Relation Age of Onset  . Allergic rhinitis Sister   . Diabetes Sister   . Hypertension Sister   . Heart attack Father 35  . Heart disease Father   . Stomach cancer Maternal Grandmother   . Heart disease Mother   . Colon cancer Neg Hx   . Angioedema Neg Hx   . Asthma Neg Hx   . Eczema Neg Hx   . Urticaria Neg Hx   . Immunodeficiency Neg Hx   . Breast cancer Neg Hx     Social History Social History   Tobacco Use  . Smoking status: Former Smoker    Packs/day: 0.50    Years: 20.00    Pack years: 10.00    Types: Cigarettes    Last attempt to quit: 02/19/1984    Years since quitting: 33.7  . Smokeless tobacco: Never Used  Substance Use Topics  . Alcohol use: No  . Drug use: No     Allergies   Shellfish allergy; Ace inhibitors; Pitavastatin; and Sulfa  antibiotics   Review of Systems Review of Systems  All other systems reviewed and are negative.    Physical Exam Updated Vital Signs BP (!) 146/64 (BP Location: Right Arm)   Pulse 82   Temp 98.8 F (37.1 C) (Oral)   Resp 18   Ht 5\' 5"  (1.651 m)   Wt 98.6 kg   SpO2 99%   BMI 36.17 kg/m   Physical Exam  Constitutional: She is oriented to person, place, and time. She appears well-developed and well-nourished. No distress.  HENT:  Head: Normocephalic and atraumatic.  Eyes: EOM are normal.  Neck: Normal range of  motion.  Cardiovascular: Normal rate, regular rhythm and normal heart sounds.  Pulmonary/Chest: Effort normal and breath sounds normal.  Abdominal: Soft. She exhibits no distension. There is no tenderness.  Genitourinary:  Genitourinary Comments: Chaperone present for rectal exam.  Stool is brown.  A few flecks of blood without gross bright red blood per rectum.  Small nonbleeding fissure.  No obvious hemorrhoids encountered  Musculoskeletal: Normal range of motion.  Neurological: She is alert and oriented to person, place, and time.  Skin: Skin is warm and dry.  Psychiatric: She has a normal mood and affect. Judgment normal.  Nursing note and vitals reviewed.    ED Treatments / Results  Labs (all labs ordered are listed, but only abnormal results are displayed) Labs Reviewed  CBC - Abnormal; Notable for the following components:      Result Value   WBC 12.7 (*)    All other components within normal limits  BASIC METABOLIC PANEL - Abnormal; Notable for the following components:   Glucose, Bld 101 (*)    All other components within normal limits    EKG None  Radiology No results found.  Procedures Procedures (including critical care time)  Medications Ordered in ED Medications  metroNIDAZOLE (FLAGYL) tablet 500 mg (500 mg Oral Given 11/16/17 1524)     Initial Impression / Assessment and Plan / ED Course  I have reviewed the triage vital signs and  the nursing notes.  Pertinent labs & imaging results that were available during my care of the patient were reviewed by me and considered in my medical decision making (see chart for details).     Suspect colitis.  Doubt significant GI bleed.  Vital signs are stable.  Hemoglobin stable.  Primary care follow-up.  Patient encouraged to return to the ER for new or worsening symptoms.  No recent travel or antibiotics.  Final Clinical Impressions(s) / ED Diagnoses   Final diagnoses:  Acute colitis    ED Discharge Orders         Ordered    metroNIDAZOLE (FLAGYL) 500 MG tablet  2 times daily     11/16/17 1510           Jola Schmidt, MD 11/17/17 1259

## 2017-11-19 ENCOUNTER — Ambulatory Visit: Payer: Medicare Other | Admitting: Neurology

## 2017-11-20 ENCOUNTER — Ambulatory Visit: Payer: Medicare Other

## 2017-11-21 ENCOUNTER — Ambulatory Visit: Payer: Medicare Other | Admitting: Podiatry

## 2017-11-28 ENCOUNTER — Ambulatory Visit: Payer: Self-pay

## 2017-11-28 ENCOUNTER — Telehealth: Payer: Self-pay

## 2017-11-28 NOTE — Telephone Encounter (Signed)
Pt. Came in today with no paper work. Last injection was given August 23rd @ 0.50 every 4 weeks. So she should have rec'd injection September 23rd. So she is only like 2 wks late. Do we need to back her down to green B schedule and keep C schedule when back on red every 4 wks @0 .50 or just give her 0.025 on the red vial and resume her red vial schedule? She is coming back on Wednesday to pick up her vial.

## 2017-12-01 NOTE — Telephone Encounter (Signed)
0.025 cc of the red vial and resume red vial schedule. Thanks.

## 2017-12-03 ENCOUNTER — Ambulatory Visit (INDEPENDENT_AMBULATORY_CARE_PROVIDER_SITE_OTHER): Payer: Medicare Other | Admitting: Family Medicine

## 2017-12-03 ENCOUNTER — Encounter: Payer: Self-pay | Admitting: Family Medicine

## 2017-12-03 ENCOUNTER — Encounter (HOSPITAL_BASED_OUTPATIENT_CLINIC_OR_DEPARTMENT_OTHER): Payer: Self-pay

## 2017-12-03 ENCOUNTER — Ambulatory Visit (HOSPITAL_BASED_OUTPATIENT_CLINIC_OR_DEPARTMENT_OTHER)
Admission: RE | Admit: 2017-12-03 | Discharge: 2017-12-03 | Disposition: A | Discharge status: Home / Self Care | Payer: Medicare Other | Source: Ambulatory Visit | Attending: Family Medicine | Admitting: Family Medicine

## 2017-12-03 ENCOUNTER — Ambulatory Visit (INDEPENDENT_AMBULATORY_CARE_PROVIDER_SITE_OTHER): Payer: Medicare Other | Admitting: *Deleted

## 2017-12-03 VITALS — BP 140/70 | HR 91 | Temp 98.1°F | Resp 16 | Ht 65.0 in | Wt 221.0 lb

## 2017-12-03 DIAGNOSIS — I6523 Occlusion and stenosis of bilateral carotid arteries: Secondary | ICD-10-CM | POA: Diagnosis not present

## 2017-12-03 DIAGNOSIS — R1032 Left lower quadrant pain: Secondary | ICD-10-CM

## 2017-12-03 DIAGNOSIS — K5730 Diverticulosis of large intestine without perforation or abscess without bleeding: Secondary | ICD-10-CM | POA: Insufficient documentation

## 2017-12-03 DIAGNOSIS — K4090 Unilateral inguinal hernia, without obstruction or gangrene, not specified as recurrent: Secondary | ICD-10-CM | POA: Insufficient documentation

## 2017-12-03 DIAGNOSIS — R0989 Other specified symptoms and signs involving the circulatory and respiratory systems: Secondary | ICD-10-CM | POA: Insufficient documentation

## 2017-12-03 DIAGNOSIS — J309 Allergic rhinitis, unspecified: Secondary | ICD-10-CM

## 2017-12-03 MED ORDER — IOPAMIDOL (ISOVUE-300) INJECTION 61%
100.0000 mL | Freq: Once | INTRAVENOUS | Status: AC | PRN
  Administered 2017-12-03: 100 mL via INTRAVENOUS

## 2017-12-03 NOTE — Progress Notes (Signed)
Immunotherapy   Patient Details  Name: Stephanie Parks MRN: [id number] Date of Birth: Jun 13, 1945  12/03/2017  [name] here to pick up  mite-cr-weed Following schedule: C  Frequency:1 time per week @0 .5 every 4 weeks Epi-Pen:Epi-Pen Available  Consent signed and patient instructions given.   [name] 12/03/2017, 11:31 AM

## 2017-12-03 NOTE — Progress Notes (Addendum)
[place] at [facility] [address] [place], [place] [address] [phone]  Date:  12/03/2017   Name:  Stephanie Parks   DOB:  [birth year]   MRN:  [id number]  PCP:  [name], MD    Chief Complaint: Follow-up (ED follow up, acute colitis, sitll having abdominal pain)    History of Present Illness:  Stephanie Parks is a 72 y.o. very pleasant female patient who presents with the following:  Last seen by myself in June She was in the ER on 9/29 with colitis-   IPatient is a 72 year old female presents emergency department with diarrhea which began last night.  She had diarrhea throughout the night and this morning with a small amount of blood in the toilet and on the tissue.  Stool remained brown.  Reports some crampy lower abdominal discomfort without urinary symptoms.  No fevers or chills.  No recent sick contacts.  Denies nausea vomiting.  She reports mild weakness generally. She was released to home with dx of colitis and rx for flagyl She reports that she was actually passing clots of blood in the toilet when her sx were acute  She notes that her diarrhea is better, but not resolved  She is no longer having GI bleeding  She is still having some pains in her lower belly- no change in this over the last couple of weeks No vomiting No nausea She is able to eat ok  She had labs but not a CT scan  She does have a hiatal hernia No history of diverticulitis known  Last colonoscopy in 2013  No sick contacts No suspicious foods, no camping, no international travel  No fever   Patient Active Problem List   Diagnosis Date Noted  . Unilateral primary osteoarthritis, left knee 10/07/2017  . Ingrown toenail 08/10/2017  . Coughing 04/24/2017  . 3-vessel CAD 03/01/2015  . Aneurysm ([place]) 03/01/2015  . Dizziness 03/01/2015  . Hypercholesterolemia 03/01/2015  . Decreased potassium in the blood 03/01/2015  . Paresthesia  of arm 03/01/2015  . Allergic rhinitis 03/01/2015  . Achalasia 09/28/2012  . CN (constipation) 09/28/2012  . Cephalalgia 08/24/2012  . HTN (hypertension) 04/19/2011  . Hyperlipidemia 04/19/2011  . GERD (gastroesophageal reflux disease) 04/19/2011  . Chest pain 04/04/2011    Past Medical History:  Diagnosis Date  . GERD (gastroesophageal reflux disease)   . Hiatal hernia   . Hyperlipidemia   . Hypertension   . Migraine headache   . Schatzki's ring     Past Surgical History:  Procedure Laterality Date  . ABDOMINAL HYSTERECTOMY    . BACK SURGERY     x 3  . CARPAL TUNNEL RELEASE     left wrist  . CERVICAL SPINE SURGERY    . HEMORRHOID SURGERY      Social History   Tobacco Use  . Smoking status: Former Smoker    Packs/day: 0.50    Years: 20.00    Pack years: 10.00    Types: Cigarettes    Last attempt to quit: 02/19/1984    Years since quitting: 33.8  . Smokeless tobacco: Never Used  Substance Use Topics  . Alcohol use: No  . Drug use: No    Family History  Problem Relation Age of Onset  . Allergic rhinitis Sister   . Diabetes Sister   . Hypertension Sister   . Heart attack Father 58  . Heart disease Father   .  Stomach cancer Maternal Grandmother   . Heart disease Mother   . Colon cancer Neg Hx   . Angioedema Neg Hx   . Asthma Neg Hx   . Eczema Neg Hx   . Urticaria Neg Hx   . Immunodeficiency Neg Hx   . Breast cancer Neg Hx     Allergies  Allergen Reactions  . Shellfish Allergy Hives and Swelling  . Ace Inhibitors Other (See Comments)  . Pitavastatin   . Sulfa Antibiotics Other (See Comments) and Rash    Fine bumps    Medication list has been reviewed and updated.  Current Outpatient Medications on File Prior to Visit  Medication Sig Dispense Refill  . albuterol (PROVENTIL HFA;VENTOLIN HFA) 108 (90 Base) MCG/ACT inhaler Inhale 2 puffs into the lungs every 6 (six) hours as needed for wheezing or shortness of breath. 1 Inhaler 2  . alendronate  (FOSAMAX) 10 MG tablet TAKE 1 TABLET BY MOUTH DAILY BEFORE BREAKFAST. TAKE WITH A FULL GLASS OF WATER ON EMPTY STOMACH 90 tablet 1  . aspirin 81 MG tablet Take 81 mg by mouth daily.    Marland Kitchen azelastine (ASTELIN) 0.1 % nasal spray Place 2 sprays into both nostrils 2 (two) times daily as needed for rhinitis. Use in each nostril as directed 30 mL 5  . calcium carbonate (OS-CAL) 600 MG tablet Take 1 tablet (600 mg total) by mouth 2 (two) times daily with a meal. 60 tablet 0  . fluticasone (FLONASE) 50 MCG/ACT nasal spray USE 2 SPRAYS IN NOSTRIL(S) DAILY AS NEEDED.  3  . fluticasone (FLONASE) 50 MCG/ACT nasal spray Place 2 sprays into both nostrils daily. 16 g 1  . hydrocortisone (ANUSOL-HC) 2.5 % rectal cream Place 1 application rectally 2 (two) times daily. 30 g 1  . hydrocortisone cream 0.5 % Apply 1 application topically 2 (two) times daily. Apply to a glycerin suppository and insert rectally 30 g 0  . KLOR-CON M20 20 MEQ tablet TAKE 1 TABLET BY MOUTH EVERY DAY 90 tablet 1  . losartan-hydrochlorothiazide (HYZAAR) 50-12.5 MG tablet Take 1 tablet by mouth daily. 90 tablet 1  . lovastatin (MEVACOR) 20 MG tablet Take 20 mg by mouth.    . methylPREDNISolone (MEDROL DOSEPAK) 4 MG TBPK tablet Take as directed 21 tablet 0  . Multiple Vitamins-Minerals (MULTIVITAL) tablet Take by mouth.    [name] [facility]  Onychomycosis Nail Lacquer -  Fluconazole 2%, Terbinafine 1% DMSO Apply to affected nail once daily Qty. 120 gm 3 refills    . NONFORMULARY OR COMPOUNDED ITEM Allergen immunotherapy (Patient taking differently: Allergy shots (new vial: shot given weekly for four weeks) Then once every four weeks.) 2 each 1  . nortriptyline (PAMELOR) 25 MG capsule TAKE 1 CAPSULE (25 MG TOTAL) BY MOUTH AT BEDTIME. 90 capsule 1  . pantoprazole (PROTONIX) 40 MG tablet Take 1 tablet (40 mg total) by mouth daily. Take 30-60 minutes before breakfast. 90 tablet 2   No current facility-administered medications on  file prior to visit.     Review of Systems:  As per HPI- otherwise negative. No CP or SOB   Physical Examination: Vitals:   12/03/17 1357  BP: 140/70  Pulse: 91  Resp: 16  Temp: 98.1 F (36.7 C)  SpO2: 99%   Vitals:   12/03/17 1357  Weight: 221 lb (100.2 kg)  Height: 5' 5"  (1.651 m)   Body mass index is 36.78 kg/m. Ideal Body Weight: Weight in (lb) to have BMI = 25:  149.9  GEN: WDWN, NAD, Non-toxic, A & O x 3, obese, looks well  HEENT: Atraumatic, Normocephalic. Neck supple. No masses, No LAD.  Bilateral TM wnl, oropharynx normal.  PEERL,EOMI.   Very prominent right carotid pulse with bruit.  Pt is not sure if this is new Ears and Nose: No external deformity. CV: RRR, No M/G/R. No JVD. No thrill. No extra heart sounds. PULM: CTA B, no wheezes, crackles, rhonchi. No retractions. No resp. distress. No accessory muscle use. ABD: S, ND, +BS. No rebound. No HSM.  Mild LLQ tenderness to exam today  EXTR: No c/c/e NEURO Normal gait.  PSYCH: Normally interactive. Conversant. Not depressed or anxious appearing.  Calm demeanor.    Assessment and Plan: Bruit of right carotid artery - Plan: US Carotid Duplex Bilateral  Left lower quadrant abdominal pain - Plan: CT Abdomen Pelvis W Contrast  Here today to follow-up from ER visit 2 weeks ago- she was dx with colitis and released to home She continues to have intermittent LLQ pain and diarrhea ?diverticulitis Obtain Ct scan for her today Also will obtain carotid US as her right sided carotid pulse if very prominent and there is a bruit  Signed Lamar Blinks, MD  Received her CT report, called pt and discussed in detail CT is non acute but she does have diverticulosis Pt reports taking abx 3x over the last month or so Would like to test for C diff Place order for C diff, she will come in and pick up kit Assuming negative we will have her follow-up with GI if her sx continue   Called pt on 10/23 to touch base She  continues to notice sx from her "hiatal hernia" and wonders what to do about this.  Notes bloating and burning in her epigastrium.  Hiatal hernia is not mentioned on her CT.  She does not have any pain in the groin so I do not think the inguinal hernia is symptomatic but she is aware of it.  She did not do the C diff test- however she is no longer having diarrhea so this is not necessary to do She would like to see Dr. Hilarie Fredrickson only to discuss further.  Will send him a message that she would like to be seen. She will let me know if any change or other concerns    CLINICAL DATA: Left lower quadrant pain with diverticulitis suspected  EXAM: CT ABDOMEN AND PELVIS WITH CONTRAST TECHNIQUE: Multidetector CT imaging of the abdomen and pelvis was performed using the standard protocol following bolus administration of intravenous contrast. CONTRAST: 172m ISOVUE-300 IOPAMIDOL (ISOVUE-300) INJECTION 61% COMPARISON: 10/31/2011  FINDINGS: Lower chest: No acute finding. Calcified lower lobe granulomas. Hepatobiliary: Benign cyst in the central and left lobe liver.No evidence of biliary obstruction or stone. Pancreas: Unremarkable. Spleen: Unremarkable. Adrenals/Urinary Tract: Negative adrenals. No hydronephrosis or stone. Unremarkable bladder. Stomach/Bowel: No obstruction. No appendicitis. Mild-to-moderate left colonic diverticulosis. Vascular/Lymphatic: No acute vascular abnormality. Mild atherosclerotic calcification. No mass or adenopathy. Reproductive:Hysterectomy. Negative adnexae. Other: No ascites or pneumoperitoneum. Fatty left inguinal hernia. Musculoskeletal: No acute abnormalities. Diffuse disc and facet degeneration with L1-2 retrolisthesis.  IMPRESSION: 1. No acute finding. 2. Fatty left inguinal hernia. 3. Left colonic diverticulosis.

## 2017-12-04 ENCOUNTER — Encounter: Payer: Self-pay | Admitting: Family Medicine

## 2017-12-04 ENCOUNTER — Other Ambulatory Visit: Payer: Self-pay | Admitting: Medical

## 2017-12-08 ENCOUNTER — Encounter: Payer: Self-pay | Admitting: Podiatry

## 2017-12-08 ENCOUNTER — Ambulatory Visit: Payer: Medicare Other | Admitting: Podiatry

## 2017-12-08 DIAGNOSIS — B351 Tinea unguium: Secondary | ICD-10-CM | POA: Insufficient documentation

## 2017-12-08 DIAGNOSIS — M7661 Achilles tendinitis, right leg: Secondary | ICD-10-CM

## 2017-12-08 HISTORY — DX: Tinea unguium: B35.1

## 2017-12-08 HISTORY — DX: Achilles tendinitis, right leg: M76.61

## 2017-12-08 NOTE — Patient Instructions (Signed)

## 2017-12-08 NOTE — Progress Notes (Signed)
Subjective: 72 year old female presents the office today for follow-up evaluation of right Achilles tendinitis and she is also asking for refill the nail fungus ointment.  She says the right side is feeling much better.  She has some days that she gets some discomfort but overall she is doing much better she has no pain today.  She was wearing the boot but it caused some irritation to her legs and she stopped wearing this.  He has no other concerns today. Denies any systemic complaints such as fevers, chills, nausea, vomiting. No acute changes since last appointment, and no other complaints at this time.   Objective: AAO x3, NAD DP/PT pulses palpable bilaterally, CRT less than 3 seconds At this time there is no tenderness on the course or insertion of the Achilles tendon on the right side.  Thompson test is negative the Achilles tendon appears to be intact.  There is no edema, erythema and overall her symptoms are greatly improved. Nails are hypertrophic, dystrophic with yellow to brown discoloration.  No pain in the nails no associated redness or drainage.  No open lesions or pre-ulcerative lesions.  No pain with calf compression, swelling, warmth, erythema  Assessment: Resolving right Achilles tendinitis with onychomycosis  Plan: -All treatment options discussed with the patient including all alternatives, risks, complications.  -Regards the Achilles tendon is overall she is doing much better.  I want her to start some rehab exercises.  I gave this to her today.  Discussed physical therapy but she wants to start doing these at home.  Ice to the area as well. -I reordered the topical antifungal ointment through [facility]. -Follow-up in 6 weeks as needed or sooner if any issues are to arise. -Patient encouraged to call the office with any questions, concerns, change in symptoms.   Trula Slade DPM

## 2017-12-10 ENCOUNTER — Telehealth: Payer: Self-pay | Admitting: *Deleted

## 2017-12-10 ENCOUNTER — Telehealth: Payer: Self-pay

## 2017-12-10 NOTE — Telephone Encounter (Signed)
Patient has been scheduled for 01/23/18 at 945am. She verbalizes understanding.

## 2017-12-10 NOTE — Telephone Encounter (Signed)
Copied from [place] [id number]. Topic: Quick Communication - Other Results (Clinic Use ONLY) >> Dec 10, 2017  3:02 PM Lennox Solders wrote: Pt had abd ultrasound at [facility] and would like the results >> Dec 10, 2017  3:05 PM Lennox Solders wrote: Correction it was carotid ultrasound not abd

## 2017-12-10 NOTE — Telephone Encounter (Signed)
-----   Message from Jerene Bears, MD sent at 12/10/2017  4:26 PM EDT ----- Will get her scheduled.  Dottie Pt wants to see me, not an APP Please place in next available OV slot Thanks JMP  ----- Message ----- From: [name], MD Sent: 12/10/2017   4:00 PM EDT To: Jerene Bears, MD  Hi Ulice Dash- could you schedule Stephanie Parks to see you for a follow-up?  She wants to see just you, not a PA. Concern about ?hiatal hernia symptoms.   Thanks so much, hope that your family is well! Jess

## 2017-12-10 NOTE — Telephone Encounter (Signed)
Called patient spoke with her about results. Verbalized understanding. Dr. Lorelei Pont also spoke with patient and discussed her results a few days ago.

## 2017-12-30 ENCOUNTER — Other Ambulatory Visit: Payer: Self-pay | Admitting: Medical

## 2018-01-13 ENCOUNTER — Encounter: Payer: Self-pay | Admitting: *Deleted

## 2018-01-14 ENCOUNTER — Telehealth: Payer: Self-pay

## 2018-01-14 MED ORDER — FLUTICASONE PROPIONATE 50 MCG/ACT NA SUSP: NASAL | 9 refills | Status: DC

## 2018-01-14 NOTE — Telephone Encounter (Signed)
Received rx refill request for fluticasone spray, last refilled 2017. Pt. on astelin spray per MAR. Routed to PCP to advise.

## 2018-01-22 ENCOUNTER — Other Ambulatory Visit: Payer: Self-pay | Admitting: Family Medicine

## 2018-01-22 MED ORDER — LOVASTATIN 20 MG PO TABS: 20.0000 mg | ORAL_TABLET | Freq: Every day | ORAL | 1 refills | Status: DC

## 2018-01-22 NOTE — Telephone Encounter (Signed)
Copied from [place] [id number]. Topic: Quick Communication - Rx Refill/Question >> Jan 22, 2018  3:19 PM Leward Quan A wrote: Medication: lovastatin (MEVACOR) 20 MG tablet  Has the patient contacted their pharmacy? Yes.   (Agent: If no, request that the patient contact the pharmacy for the refill.) (Agent: If yes, when and what did the pharmacy advise?)  Preferred Pharmacy (with phone number or street name): [id number] - [place], [place] - [address] [place] [phone] (Phone) [phone] (Fax)    Agent: Please be advised that RX refills may take up to 3 business days. We ask that you follow-up with your pharmacy.

## 2018-01-22 NOTE — Telephone Encounter (Signed)
Mevacor 20 MG tab QD has been provided by a historical provider from [facility]. No current lipids on file. Last lipids 05/01/16. LOV 12/03/17  Requested medication (s) are due for refill today yes  Requested medication (s) are on the active medication list yes  Future visit scheduled No   Requested Prescriptions  Pending Prescriptions Disp Refills   lovastatin (MEVACOR) 20 MG tablet      Sig: Take 1 tablet (20 mg total) by mouth.     Cardiovascular:  Antilipid - Statins Failed - 01/22/2018  3:23 PM      Failed - Total Cholesterol in normal range and within 360 days    Cholesterol  Date Value Ref Range Status  05/01/2016 189 0 - 200 mg/dL Final    Comment:    ATP III Classification       Desirable:  < 200 mg/dL               Borderline High:  200 - 239 mg/dL          High:  > = 240 mg/dL         Failed - LDL in normal range and within 360 days    LDL Cholesterol  Date Value Ref Range Status  05/01/2016 98 0 - 99 mg/dL Final         Failed - HDL in normal range and within 360 days    HDL  Date Value Ref Range Status  05/01/2016 60.60 >39.00 mg/dL Final         Failed - Triglycerides in normal range and within 360 days    Triglycerides  Date Value Ref Range Status  05/01/2016 149.0 0.0 - 149.0 mg/dL Final    Comment:    Normal:  <150 mg/dLBorderline High:  150 - 199 mg/dL         Passed - Patient is not pregnant      Passed - Valid encounter within last 12 months    Recent Outpatient Visits          1 month ago Bruit of right carotid artery   [facility], Gay Filler, MD   2 months ago Acute maxillary sinusitis, recurrence not specified   [facility] at [place], PA-C   3 months ago Acute pain of left knee   [facility] at [place], PA-C   5 months ago Viral upper respiratory tract infection   [facility] at [place], MD   5 months ago Hiatal hernia   [facility] at [facility], Gay Filler, MD

## 2018-01-23 ENCOUNTER — Ambulatory Visit: Payer: Medicare Other | Admitting: Internal Medicine

## 2018-01-23 ENCOUNTER — Encounter: Payer: Self-pay | Admitting: Internal Medicine

## 2018-01-23 VITALS — BP 110/76 | HR 76 | Ht 65.0 in | Wt 218.0 lb

## 2018-01-23 DIAGNOSIS — K219 Gastro-esophageal reflux disease without esophagitis: Secondary | ICD-10-CM | POA: Diagnosis not present

## 2018-01-23 DIAGNOSIS — K581 Irritable bowel syndrome with constipation: Secondary | ICD-10-CM

## 2018-01-23 DIAGNOSIS — K625 Hemorrhage of anus and rectum: Secondary | ICD-10-CM

## 2018-01-23 DIAGNOSIS — R140 Abdominal distension (gaseous): Secondary | ICD-10-CM | POA: Diagnosis not present

## 2018-01-23 DIAGNOSIS — R151 Fecal smearing: Secondary | ICD-10-CM

## 2018-01-23 MED ORDER — DEXLANSOPRAZOLE 60 MG PO CPDR: 60.0000 mg | DELAYED_RELEASE_CAPSULE | Freq: Every day | ORAL | 1 refills | Status: DC

## 2018-01-23 MED ORDER — LINACLOTIDE 145 MCG PO CAPS: 145.0000 ug | ORAL_CAPSULE | Freq: Every day | ORAL | 1 refills | Status: DC

## 2018-01-23 MED ORDER — SUPREP BOWEL PREP KIT 17.5-3.13-1.6 GM/177ML PO SOLN: 1.0000 | ORAL | 0 refills | Status: DC

## 2018-01-23 NOTE — Patient Instructions (Signed)
You have been scheduled for an endoscopy and colonoscopy. Please follow the written instructions given to you at your visit today. Please pick up your prep supplies at the pharmacy within the next 1-3 days. If you use inhalers (even only as needed), please bring them with you on the day of your procedure. Your physician has requested that you go to www.startemmi.com and enter the access code given to you at your visit today. This web site gives a general overview about your procedure. However, you should still follow specific instructions given to you by our office regarding your preparation for the procedure.  We have sent the following medications to your pharmacy for you to pick up at your convenience: Dexilant 60 mg 30 minutes before breakfast daily Linzess 145 mcg 30 minutes before breakfast daily  If you are age 72 or older, your body mass index should be between 23-30. Your Body mass index is 36.28 kg/m. If this is out of the aforementioned range listed, please consider follow up with your Primary Care Provider.  If you are age 74 or younger, your body mass index should be between 19-25. Your Body mass index is 36.28 kg/m. If this is out of the aformentioned range listed, please consider follow up with your Primary Care Provider.

## 2018-01-23 NOTE — Progress Notes (Signed)
Patient ID: Stephanie Parks, female   DOB: 02/09/1946, 72 y.o.   MRN: [id number] HPI: Stephanie Parks is a 72 year old female with a past medical history of GERD with hiatal hernia, colonic diverticulosis, hypertension, hyperlipidemia who is seen to evaluate abdominal fullness, bloating but also episode of rectal bleeding/blood in stool.  She is here alone today and was last seen on 08/05/2017 by Ellouise Newer, PA-C.  She reports that in late September 2019 she developed sudden onset of diarrhea followed by crampy abdominal pain.  She then noticed blood in her stool which was both red and with clot.  She was seen in the ER.  She later had a CT scan of her abdomen pelvis for left lower quadrant pain and suspected diverticulitis.  This was performed on 12/03/2017.  This showed a left inguinal hernia, sigmoid diverticulosis without diverticulitis.  There were no other acute findings.  She reports that her rectal bleeding/blood in stool has resolved but she continues to deal with constipation and abdominal bloating.  She is using ClearLax over-the-counter as well as stool softeners and despite this still feels constipated.  Constipation is not new for her.  She also experiences fecal smearing and finds stool present with wiping.  She is not having soiling of her undergarments.  She continues to have issues with heartburn on a near daily basis and this is despite metoprolol 40 mg daily.  She denies dysphagia and odynophagia.  She last had an upper endoscopy and colonoscopy on 05/07/2011.  Upper endoscopy showed a hiatal hernia but was otherwise normal.  Her colonoscopy was normal on that day.  Past Medical History:  Diagnosis Date  . Diverticulosis   . GERD (gastroesophageal reflux disease)   . Hiatal hernia   . Hyperlipidemia   . Hypertension   . Inguinal hernia   . Migraine headache   . Schatzki's ring     Past Surgical History:  Procedure Laterality Date  . ABDOMINAL HYSTERECTOMY    . BACK  SURGERY     x 3  . CARPAL TUNNEL RELEASE     left wrist  . CERVICAL SPINE SURGERY    . HEMORRHOID SURGERY      Outpatient Medications Prior to Visit  Medication Sig Dispense Refill  . alendronate (FOSAMAX) 10 MG tablet TAKE 1 TABLET BY MOUTH DAILY BEFORE BREAKFAST. TAKE WITH A FULL GLASS OF WATER ON EMPTY STOMACH 90 tablet 1  . aspirin 81 MG tablet Take 81 mg by mouth daily.    Marland Kitchen azelastine (ASTELIN) 0.1 % nasal spray Place 2 sprays into both nostrils 2 (two) times daily as needed for rhinitis. Use in each nostril as directed 30 mL 5  . calcium carbonate (OS-CAL) 600 MG tablet Take 1 tablet (600 mg total) by mouth 2 (two) times daily with a meal. 60 tablet 0  . fluticasone (FLONASE) 50 MCG/ACT nasal spray USE 2 SPRAYS IN NOSTRIL(S) DAILY AS NEEDED. 16 g 9  . hydrocortisone (ANUSOL-HC) 2.5 % rectal cream Place 1 application rectally 2 (two) times daily. 30 g 1  . KLOR-CON M20 20 MEQ tablet TAKE 1 TABLET BY MOUTH EVERY DAY 90 tablet 1  . losartan-hydrochlorothiazide (HYZAAR) 50-12.5 MG tablet Take 1 tablet by mouth daily. 90 tablet 1  . lovastatin (MEVACOR) 20 MG tablet Take 1 tablet (20 mg total) by mouth daily. 90 tablet 1  . Multiple Vitamins-Minerals (MULTIVITAL) tablet Take by mouth.    [name] [facility]  Onychomycosis Nail Lacquer -  Fluconazole 2%,  Terbinafine 1% DMSO Apply to affected nail once daily Qty. 120 gm 3 refills    . NONFORMULARY OR COMPOUNDED ITEM Allergen immunotherapy (Patient taking differently: Allergy shots (new vial: shot given weekly for four weeks) Then once every four weeks.) 2 each 1  . nortriptyline (PAMELOR) 25 MG capsule TAKE 1 CAPSULE (25 MG TOTAL) BY MOUTH AT BEDTIME. 90 capsule 1  . pantoprazole (PROTONIX) 40 MG tablet Take 1 tablet (40 mg total) by mouth daily. Take 30-60 minutes before breakfast. 90 tablet 2  . albuterol (PROVENTIL HFA;VENTOLIN HFA) 108 (90 Base) MCG/ACT inhaler Inhale 2 puffs into the lungs every 6 (six) hours as needed  for wheezing or shortness of breath. 1 Inhaler 2  . fluticasone (FLONASE) 50 MCG/ACT nasal spray SPRAY 2 SPRAYS INTO EACH NOSTRIL EVERY DAY 16 g 1  . hydrocortisone cream 0.5 % Apply 1 application topically 2 (two) times daily. Apply to a glycerin suppository and insert rectally 30 g 0  . methylPREDNISolone (MEDROL DOSEPAK) 4 MG TBPK tablet Take as directed 21 tablet 0   No facility-administered medications prior to visit.     Allergies  Allergen Reactions  . Shellfish Allergy Hives and Swelling  . Ace Inhibitors Other (See Comments)  . Pitavastatin   . Sulfa Antibiotics Other (See Comments) and Rash    Fine bumps    Family History  Problem Relation Age of Onset  . Allergic rhinitis Sister   . Diabetes Sister   . Hypertension Sister   . Heart attack Father 55  . Heart disease Father   . Stomach cancer Maternal Grandmother   . Heart disease Mother   . Colon cancer Neg Hx   . Angioedema Neg Hx   . Asthma Neg Hx   . Eczema Neg Hx   . Urticaria Neg Hx   . Immunodeficiency Neg Hx   . Breast cancer Neg Hx     Social History   Tobacco Use  . Smoking status: Former Smoker    Packs/day: 0.50    Years: 20.00    Pack years: 10.00    Types: Cigarettes    Last attempt to quit: 02/19/1984    Years since quitting: 33.9  . Smokeless tobacco: Never Used  Substance Use Topics  . Alcohol use: No  . Drug use: No    ROS: As per history of present illness, otherwise negative  BP 110/76   Pulse 76   Ht 5\' 5"  (1.651 m) Comment: w/o shoes  Wt 218 lb (98.9 kg)   BMI 36.28 kg/m  Constitutional: Well-developed and well-nourished. No distress. HEENT: Normocephalic and atraumatic. Marland Kitchen Conjunctivae are normal.  No scleral icterus. Neck: Neck supple. Trachea midline. Cardiovascular: Normal rate, regular rhythm and intact distal pulses. No M/R/G Pulmonary/chest: Effort normal and breath sounds normal. No wheezing, rales or rhonchi. Abdominal: Soft, nontender, nondistended. Bowel sounds  active throughout. There are no masses palpable. No hepatosplenomegaly. Extremities: no clubbing, cyanosis, or edema Neurological: Alert and oriented to person place and time. Skin: Skin is warm and dry.  Psychiatric: Normal mood and affect. Behavior is normal.  RELEVANT LABS AND IMAGING: CBC    Component Value Date/Time   WBC 12.7 (H) 11/16/2017 1407   RBC 4.17 11/16/2017 1407   HGB 12.1 11/16/2017 1407   HCT 36.6 11/16/2017 1407   PLT 353 11/16/2017 1407   MCV 87.8 11/16/2017 1407   MCH 29.0 11/16/2017 1407   MCHC 33.1 11/16/2017 1407   RDW 14.8 11/16/2017 1407   LYMPHSABS 3.0  11/12/2017 1112   MONOABS 0.5 11/12/2017 1112   EOSABS 0.5 11/12/2017 1112   BASOSABS 0.0 11/12/2017 1112    CMP     Component Value Date/Time   NA 140 11/16/2017 1407   NA 144 08/30/2015   K 3.5 11/16/2017 1407   CL 98 11/16/2017 1407   CO2 31 11/16/2017 1407   GLUCOSE 101 (H) 11/16/2017 1407   BUN 11 11/16/2017 1407   BUN 13 08/30/2015   CREATININE 0.89 11/16/2017 1407   CALCIUM 9.4 11/16/2017 1407   PROT 6.4 03/05/2017 1644   ALBUMIN 3.5 03/05/2017 1644   AST 18 03/05/2017 1644   ALT 13 03/05/2017 1644   ALKPHOS 62 03/05/2017 1644   BILITOT 0.3 03/05/2017 1644   GFRNONAA >60 11/16/2017 1407   GFRAA >60 11/16/2017 1407   CT ABDOMEN AND PELVIS WITH CONTRAST   TECHNIQUE: Multidetector CT imaging of the abdomen and pelvis was performed using the standard protocol following bolus administration of intravenous contrast.   CONTRAST:  175mL ISOVUE-300 IOPAMIDOL (ISOVUE-300) INJECTION 61%   COMPARISON:  10/31/2011   FINDINGS: Lower chest:  No acute finding.  Calcified lower lobe granulomas.   Hepatobiliary: Benign cyst in the central and left lobe liver.No evidence of biliary obstruction or stone.   Pancreas: Unremarkable.   Spleen: Unremarkable.   Adrenals/Urinary Tract: Negative adrenals. No hydronephrosis or stone. Unremarkable bladder.   Stomach/Bowel: No obstruction. No  appendicitis. Mild-to-moderate left colonic diverticulosis.   Vascular/Lymphatic: No acute vascular abnormality. Mild atherosclerotic calcification. No mass or adenopathy.   Reproductive:Hysterectomy.  Negative adnexae.   Other: No ascites or pneumoperitoneum.  Fatty left inguinal hernia.   Musculoskeletal: No acute abnormalities. Diffuse disc and facet degeneration with L1-2 retrolisthesis.   IMPRESSION: 1. No acute finding. 2. Fatty left inguinal hernia. 3. Left colonic diverticulosis.     Electronically Signed   By: Monte Fantasia M.D.   On: 12/03/2017 19:34     ASSESSMENT/PLAN: 72 year old female with a past medical history of GERD with hiatal hernia, colonic diverticulosis, hypertension, hyperlipidemia who is seen to evaluate abdominal fullness, bloating but also episode of rectal bleeding/blood in stool.  1. Constipation/abdominal bloating/blood in stools --I am suspicious for constipation predominant irritable bowel leading to abdominal bloating and infrequent stool.  She is using over-the-counter laxative but with incomplete result.  The rectal bleeding/blood in stool which she experienced several months ago warrants further investigation and for this reason I recommended colonoscopy.  We discussed the risk, benefits and alternatives and she is agreeable and wishes to proceed.  Query episode of ischemic colitis in September 2019, described in HPI  I am also going to start Linzess 145 mcg daily for constipation.  I asked her to notify me if she develops diarrhea and also made her aware that we may need to dose titrate for complete response.  She will hold other over-the-counter laxatives for now  Fecal smearing may relate to internal hemorrhoids which we will examine for at the time of colonoscopy.  Also may relate to current laxative use or even pelvic floor dysfunction.  2.  GERD/heartburn --despite pantoprazole.  For that reason repeat upper endoscopy.  We discussed the  risk, benefits and alternatives and she is agreeable and wishes to proceed.  Change to Dexilant 60 mg daily.  GERD diet.   NT:[id number], Gay Filler, [address], [place] [id number]

## 2018-01-26 ENCOUNTER — Telehealth: Payer: Self-pay | Admitting: Internal Medicine

## 2018-01-26 NOTE — Telephone Encounter (Signed)
Left voicemail for patient indicating that she should contact insurance to find out which medications they will cover and should call us back to let us know so we can choose between those.

## 2018-01-26 NOTE — Telephone Encounter (Signed)
Pt call and stated that the prescription that was called in was not covered and to expensive.

## 2018-01-27 NOTE — Progress Notes (Signed)
[place] at [facility] [address] [place], [place] [address] [phone]  Date:  01/28/2018   Name:  Stephanie Parks   DOB:  1945-12-31   MRN:  [id number]  PCP:  [name], MD    Chief Complaint: Fall (fall couple of weeks ago, hit head, lost balance and fell on left side hitting floor of bathroom, pain in neck on right side radiating down arm)   History of Present Illness:  Stephanie Parks is a 72 y.o. very pleasant female patient who presents with the following:  Here today for evaluation following a fall.  I last saw her in October following a bout with colitis. History of hypertension, hyperlipidemia, GERD, three-vessel CAD.  Flu: declines to have this today  Pneumonia: declines  Tetanus: declines   She reports that 2 weeks ago she fell out of bathtub She was wearing flip-flops as she was cleaning her bathtub, and thinks that somehow her shoes may have gotten caught and caused her to fall out of the tum and onto the floor She fell onto her left side, but her right neck and shoulder are painful She noted this pain right away when she fell She took tylenol which did help, but then the pain seemed to come back The right arm can be painful to use, she is not sure if it may be weak  She does have history of cervical spine fusion.  No unusual HA   No LOC She is otherwise unhurt   Patient Active Problem List   Diagnosis Date Noted  . Tendonitis, Achilles, right 12/08/2017  . Onychomycosis 12/08/2017  . Unilateral primary osteoarthritis, left knee 10/07/2017  . Ingrown toenail 08/10/2017  . Coughing 04/24/2017  . 3-vessel CAD 03/01/2015  . Aneurysm ([place]) 03/01/2015  . Dizziness 03/01/2015  . Hypercholesterolemia 03/01/2015  . Decreased potassium in the blood 03/01/2015  . Paresthesia of arm 03/01/2015  . Allergic rhinitis 03/01/2015  . Achalasia 09/28/2012  . CN (constipation) 09/28/2012  .  Cephalalgia 08/24/2012  . HTN (hypertension) 04/19/2011  . Hyperlipidemia 04/19/2011  . GERD (gastroesophageal reflux disease) 04/19/2011  . Chest pain 04/04/2011    Past Medical History:  Diagnosis Date  . Diverticulosis   . GERD (gastroesophageal reflux disease)   . Hiatal hernia   . Hyperlipidemia   . Hypertension   . Inguinal hernia   . Migraine headache   . Schatzki's ring     Past Surgical History:  Procedure Laterality Date  . ABDOMINAL HYSTERECTOMY    . BACK SURGERY     x 3  . CARPAL TUNNEL RELEASE     left wrist  . CERVICAL SPINE SURGERY    . HEMORRHOID SURGERY      Social History   Tobacco Use  . Smoking status: Former Smoker    Packs/day: 0.50    Years: 20.00    Pack years: 10.00    Types: Cigarettes    Last attempt to quit: 02/19/1984    Years since quitting: 33.9  . Smokeless tobacco: Never Used  Substance Use Topics  . Alcohol use: No  . Drug use: No    Family History  Problem Relation Age of Onset  . Allergic rhinitis Sister   . Diabetes Sister   . Hypertension Sister   . Heart attack Father 59  . Heart disease Father   . Stomach cancer Maternal Grandmother   . Heart disease Mother   .  Colon cancer Neg Hx   . Angioedema Neg Hx   . Asthma Neg Hx   . Eczema Neg Hx   . Urticaria Neg Hx   . Immunodeficiency Neg Hx   . Breast cancer Neg Hx     Allergies  Allergen Reactions  . Shellfish Allergy Hives and Swelling  . Ace Inhibitors Other (See Comments)  . Pitavastatin   . Sulfa Antibiotics Other (See Comments) and Rash    Fine bumps    Medication list has been reviewed and updated.  Current Outpatient Medications on File Prior to Visit  Medication Sig Dispense Refill  . alendronate (FOSAMAX) 10 MG tablet TAKE 1 TABLET BY MOUTH DAILY BEFORE BREAKFAST. TAKE WITH A FULL GLASS OF WATER ON EMPTY STOMACH 90 tablet 1  . aspirin 81 MG tablet Take 81 mg by mouth daily.    Marland Kitchen azelastine (ASTELIN) 0.1 % nasal spray Place 2 sprays into both  nostrils 2 (two) times daily as needed for rhinitis. Use in each nostril as directed 30 mL 5  . calcium carbonate (OS-CAL) 600 MG tablet Take 1 tablet (600 mg total) by mouth 2 (two) times daily with a meal. 60 tablet 0  . dexlansoprazole (DEXILANT) 60 MG capsule Take 1 capsule (60 mg total) by mouth daily. 90 capsule 1  . fluticasone (FLONASE) 50 MCG/ACT nasal spray USE 2 SPRAYS IN NOSTRIL(S) DAILY AS NEEDED. 16 g 9  . KLOR-CON M20 20 MEQ tablet TAKE 1 TABLET BY MOUTH EVERY DAY 90 tablet 1  . linaclotide (LINZESS) 145 MCG CAPS capsule Take 1 capsule (145 mcg total) by mouth daily before breakfast. 90 capsule 1  . losartan-hydrochlorothiazide (HYZAAR) 50-12.5 MG tablet Take 1 tablet by mouth daily. 90 tablet 1  . lovastatin (MEVACOR) 20 MG tablet Take 1 tablet (20 mg total) by mouth daily. 90 tablet 1  . Multiple Vitamins-Minerals (MULTIVITAL) tablet Take by mouth.    [name] [facility]  Onychomycosis Nail Lacquer -  Fluconazole 2%, Terbinafine 1% DMSO Apply to affected nail once daily Qty. 120 gm 3 refills    . NONFORMULARY OR COMPOUNDED ITEM Allergen immunotherapy (Patient taking differently: Allergy shots (new vial: shot given weekly for four weeks) Then once every four weeks.) 2 each 1  . nortriptyline (PAMELOR) 25 MG capsule TAKE 1 CAPSULE (25 MG TOTAL) BY MOUTH AT BEDTIME. 90 capsule 1  . pantoprazole (PROTONIX) 40 MG tablet Take 1 tablet (40 mg total) by mouth daily. Take 30-60 minutes before breakfast. 90 tablet 2  . SUPREP BOWEL PREP KIT 17.5-3.13-1.6 GM/177ML SOLN Take 1 kit by mouth as directed. For colonoscopy prep 2 Bottle 0   No current facility-administered medications on file prior to visit.     Review of Systems:  As per HPI- otherwise negative.   Physical Examination: Vitals:   01/28/18 1137  BP: 132/70  Pulse: 82  Resp: 16  Temp: 97.6 F (36.4 C)  SpO2: 99%   Vitals:   01/28/18 1137  Weight: 219 lb (99.3 kg)  Height: 5' 5"  (1.651 m)   Body  mass index is 36.44 kg/m. Ideal Body Weight: Weight in (lb) to have BMI = 25: 149.9  GEN: WDWN, NAD, Non-toxic, A & O x 3, obese, looks well. HEENT: Atraumatic, Normocephalic. Neck supple. No masses, No LAD.  Bilateral TM wnl, oropharynx normal.  PEERL,EOMI.   Ears and Nose: No external deformity. CV: RRR, No M/G/R. No JVD. No thrill. No extra heart sounds. PULM: CTA B, no wheezes, crackles,  rhonchi. No retractions. No resp. distress. No accessory muscle use. ABD: S, NT, ND EXTR: No c/c/e NEURO Normal gait.  PSYCH: Normally interactive. Conversant. Not depressed or anxious appearing.  Calm demeanor.  She has tenderness and spasm of her right trapezius muscle.  No redness, warmth, or swelling.  No bony tenderness of the cervical spine.  Normal range of motion of the cervical spine.  She does have slightly reduced extension of her neck, in keeping with prior cervical spine fusion. Bilateral upper extremities display normal strength, sensation, and deep tendon reflexes.  Normal range of motion of the right shoulder. Normal strength and sensation of both lower extremities. Normal strength and sensation of the face.  Assessment and Plan: Upper back pain on right side - Plan: methocarbamol (ROBAXIN) 500 MG tablet, DG Cervical Spine With Flex & Extend, CANCELED: DG Cervical Spine Complete, CANCELED: DG Cervical Spine 2 or 3 views  Strain of neck muscle, initial encounter - Plan: methocarbamol (ROBAXIN) 500 MG tablet, DG Cervical Spine With Flex & Extend, CANCELED: DG Cervical Spine Complete, CANCELED: DG Cervical Spine 2 or 3 views   Here today with right neck and shoulder pain following a fall from her bathtub which occurred 2 weeks ago. Her symptoms seem to be muscular in origin.  However, given her age will obtain plain films of her neck.  Gave her Robaxin to use as needed.  Have cautioned her regarding sedation with this medication Signed Lamar Blinks, MD  Received his neck films, called  patient and we went over them together.  No acute fracture.  Dg Cervical Spine With Flex & Extend  Result Date: 01/28/2018 CLINICAL DATA:  Right-sided neck pain and limited range of motion. Pain radiates into the right shoulder. Fall few weeks ago. EXAM: CERVICAL SPINE COMPLETE WITH FLEXION AND EXTENSION VIEWS COMPARISON:  MRI cervical spine dated July 19, 2017. FINDINGS: The lateral view is diagnostic to the C7 level. Prior C4-C6 ACDF with solid osseous fusion. There is no acute fracture or subluxation. Vertebral body heights are preserved. Unchanged reversal of the normal cervical lordosis. No dynamic instability. Unchanged severe C3-C4 and moderate C6-C7 adjacent segment disc disease. Mild neuroforaminal stenosis bilaterally at C6-C7 and on the left at C7-T1.Normal prevertebral soft tissues. IMPRESSION: 1.  No acute osseous abnormality. 2. Prior C4-C6 ACDF with unchanged adjacent segment disease at C3-C4 and C6-C7. Electronically Signed   By: Titus Dubin M.D.   On: 01/28/2018 12:41

## 2018-01-28 ENCOUNTER — Encounter: Payer: Self-pay | Admitting: Family Medicine

## 2018-01-28 ENCOUNTER — Ambulatory Visit (HOSPITAL_BASED_OUTPATIENT_CLINIC_OR_DEPARTMENT_OTHER)
Admission: RE | Admit: 2018-01-28 | Discharge: 2018-01-28 | Disposition: A | Discharge status: Home / Self Care | Payer: Medicare Other | Source: Ambulatory Visit | Attending: Family Medicine | Admitting: Family Medicine

## 2018-01-28 ENCOUNTER — Ambulatory Visit (INDEPENDENT_AMBULATORY_CARE_PROVIDER_SITE_OTHER): Payer: Medicare Other | Admitting: Family Medicine

## 2018-01-28 VITALS — BP 132/70 | HR 82 | Temp 97.6°F | Resp 16 | Ht 65.0 in | Wt 219.0 lb

## 2018-01-28 DIAGNOSIS — S161XXA Strain of muscle, fascia and tendon at neck level, initial encounter: Secondary | ICD-10-CM | POA: Insufficient documentation

## 2018-01-28 DIAGNOSIS — M549 Dorsalgia, unspecified: Secondary | ICD-10-CM | POA: Diagnosis not present

## 2018-01-28 DIAGNOSIS — M4322 Fusion of spine, cervical region: Secondary | ICD-10-CM | POA: Diagnosis not present

## 2018-01-28 MED ORDER — METHOCARBAMOL 500 MG PO TABS: 500.0000 mg | ORAL_TABLET | Freq: Three times a day (TID) | ORAL | 0 refills | Status: DC | PRN

## 2018-01-28 NOTE — Patient Instructions (Signed)
Please good to see you today.  I am sorry that you hurt your neck.  Please go to the ground floor, and have x-rays performed of your neck.   I have asked for these films to be read right away.  If you would, please wait downstairs until we get this report in.  I will call you on your cell phone, and you may go home assuming all is well. I sent in a prescription for Robaxin, which is a muscle relaxer.  You may use this up to 3 times a day as needed.  However, this medication may cause drowsiness so please keep this in mind.  You might also try heat, ibuprofen, and Tylenol. Please let me know if your neck is not improving over the next week or so.  Let me know sooner if worse.

## 2018-02-09 ENCOUNTER — Other Ambulatory Visit: Payer: Self-pay | Admitting: Family Medicine

## 2018-02-09 DIAGNOSIS — Z5181 Encounter for therapeutic drug level monitoring: Secondary | ICD-10-CM

## 2018-02-10 ENCOUNTER — Encounter: Payer: Self-pay | Admitting: Internal Medicine

## 2018-02-12 ENCOUNTER — Telehealth: Payer: Self-pay | Admitting: Internal Medicine

## 2018-02-12 NOTE — Telephone Encounter (Signed)
Pt called to inform that copay for suprep is over $45 and it is out of her budget, her proc is scheduled on 02/19/2018, she also has other information from Medicare regarding other medications that she wants to speak about.

## 2018-02-13 NOTE — Telephone Encounter (Signed)
Left a message to return call.  

## 2018-02-16 NOTE — Telephone Encounter (Signed)
Suprep sample given to patient.

## 2018-02-16 NOTE — Telephone Encounter (Signed)
Left voicemail for patient to call back. 

## 2018-02-19 ENCOUNTER — Ambulatory Visit (AMBULATORY_SURGERY_CENTER): Payer: Medicare Other | Admitting: Internal Medicine

## 2018-02-19 ENCOUNTER — Encounter: Payer: Self-pay | Admitting: Internal Medicine

## 2018-02-19 VITALS — BP 138/72 | HR 79 | Temp 98.4°F | Resp 15

## 2018-02-19 DIAGNOSIS — E785 Hyperlipidemia, unspecified: Secondary | ICD-10-CM | POA: Diagnosis not present

## 2018-02-19 DIAGNOSIS — K625 Hemorrhage of anus and rectum: Secondary | ICD-10-CM

## 2018-02-19 DIAGNOSIS — D122 Benign neoplasm of ascending colon: Secondary | ICD-10-CM

## 2018-02-19 DIAGNOSIS — K449 Diaphragmatic hernia without obstruction or gangrene: Secondary | ICD-10-CM | POA: Diagnosis not present

## 2018-02-19 DIAGNOSIS — K5730 Diverticulosis of large intestine without perforation or abscess without bleeding: Secondary | ICD-10-CM

## 2018-02-19 DIAGNOSIS — K5904 Chronic idiopathic constipation: Secondary | ICD-10-CM

## 2018-02-19 DIAGNOSIS — K219 Gastro-esophageal reflux disease without esophagitis: Secondary | ICD-10-CM

## 2018-02-19 MED ORDER — LUBIPROSTONE 8 MCG PO CAPS: 8.0000 ug | ORAL_CAPSULE | Freq: Two times a day (BID) | ORAL | 0 refills | Status: DC

## 2018-02-19 MED ORDER — SODIUM CHLORIDE 0.9 % IV SOLN: 500.0000 mL | Freq: Once | INTRAVENOUS | Status: DC

## 2018-02-19 NOTE — Patient Instructions (Signed)
YOU HAD AN ENDOSCOPIC PROCEDURE TODAY AT THE [facility]:   Refer to the procedure report that was given to you for any specific questions about what was found during the examination.  If the procedure report does not answer your questions, please call your gastroenterologist to clarify.  If you requested that your care partner not be given the details of your procedure findings, then the procedure report has been included in a sealed envelope for you to review at your convenience later.  YOU SHOULD EXPECT: Some feelings of bloating in the abdomen. Passage of more gas than usual.  Walking can help get rid of the air that was put into your GI tract during the procedure and reduce the bloating. If you had a lower endoscopy (such as a colonoscopy or flexible sigmoidoscopy) you may notice spotting of blood in your stool or on the toilet paper. If you underwent a bowel prep for your procedure, you may not have a normal bowel movement for a few days.  Please Note:  You might notice some irritation and congestion in your nose or some drainage.  This is from the oxygen used during your procedure.  There is no need for concern and it should clear up in a day or so.  SYMPTOMS TO REPORT IMMEDIATELY:   Following lower endoscopy (colonoscopy or flexible sigmoidoscopy):  Excessive amounts of blood in the stool  Significant tenderness or worsening of abdominal pains  Swelling of the abdomen that is new, acute  Fever of 100F or higher   Following upper endoscopy (EGD)  Vomiting of blood or coffee ground material  New chest pain or pain under the shoulder blades  Painful or persistently difficult swallowing  New shortness of breath  Fever of 100F or higher  Black, tarry-looking stools  For urgent or emergent issues, a gastroenterologist can be reached at any hour by calling [phone].   DIET:  We do recommend a small meal at first, but then you may proceed to your regular diet.  Drink  plenty of fluids but you should avoid alcoholic beverages for 24 hours.  ACTIVITY:  You should plan to take it easy for the rest of today and you should NOT DRIVE or use heavy machinery until tomorrow (because of the sedation medicines used during the test).    FOLLOW UP: Our staff will call the number listed on your records the next business day following your procedure to check on you and address any questions or concerns that you may have regarding the information given to you following your procedure. If we do not reach you, we will leave a message.  However, if you are feeling well and you are not experiencing any problems, there is no need to return our call.  We will assume that you have returned to your regular daily activities without incident.  If any biopsies were taken you will be contacted by phone or by letter within the next 1-3 weeks.  Please call us at [phone] if you have not heard about the biopsies in 3 weeks.    SIGNATURES/CONFIDENTIALITY: You and/or your care partner have signed paperwork which will be entered into your electronic medical record.  These signatures attest to the fact that that the information above on your After Visit Summary has been reviewed and is understood.  Full responsibility of the confidentiality of this discharge information lies with you and/or your care-partner.  Read all of the handouts given to you by your recovery   room nurse. 

## 2018-02-19 NOTE — Op Note (Signed)
[place] Patient Name: Arron Mcnaught Procedure Date: 02/19/2018 11:00 AM MRN: [id number] Endoscopist: Jerene Bears , MD Age: 73 Referring MD:  Date of Birth: 1945/07/12 Gender: Female Account #: [id number] Procedure:                Colonoscopy Indications:              Rectal bleeding, abdominal bloating and constipation Medicines:                Monitored Anesthesia Care Procedure:                Pre-Anesthesia Assessment:                           - Prior to the procedure, a History and Physical                            was performed, and patient medications and                            allergies were reviewed. The patient's tolerance of                            previous anesthesia was also reviewed. The risks                            and benefits of the procedure and the sedation                            options and risks were discussed with the patient.                            All questions were answered, and informed consent                            was obtained. Prior Anticoagulants: The patient has                            taken no previous anticoagulant or antiplatelet                            agents. ASA Grade Assessment: II - A patient with                            mild systemic disease. After reviewing the risks                            and benefits, the patient was deemed in                            satisfactory condition to undergo the procedure.                           After obtaining informed consent, the colonoscope  was passed under direct vision. Throughout the                            procedure, the patient's blood pressure, pulse, and                            oxygen saturations were monitored continuously. The                            Model PCF-H190DL [id number]) scope was introduced                            through the anus and advanced to the cecum,                            identified by  appendiceal orifice and ileocecal                            valve. The colonoscopy was performed without                            difficulty. The patient tolerated the procedure                            well. The quality of the bowel preparation was                            good. The ileocecal valve, appendiceal orifice, and                            rectum were photographed. Scope In: 11:16:44 AM Scope Out: 12:45:80 AM Scope Withdrawal Time: 0 hours 15 minutes 16 seconds  Total Procedure Duration: 0 hours 19 minutes 57 seconds  Findings:                 The digital rectal exam was normal.                           Three sessile polyps were found in the ascending                            colon. The polyps were 3 to 5 mm in size. These                            polyps were removed with a cold snare. Resection                            and retrieval were complete.                           A few small-mouthed diverticula were found in the                            sigmoid colon.  The exam was otherwise without abnormality on                            direct and retroflexion views. Complications:            No immediate complications. Estimated Blood Loss:     Estimated blood loss was minimal. Impression:               - Three 3 to 5 mm polyps in the ascending colon,                            removed with a cold snare. Resected and retrieved.                           - Diverticulosis in the sigmoid colon.                           - The examination was otherwise normal on direct                            and retroflexion views. Recommendation:           - Patient has a contact number available for                            emergencies. The signs and symptoms of potential                            delayed complications were discussed with the                            patient. Return to normal activities tomorrow.                             Written discharge instructions were provided to the                            patient.                           - Resume previous diet.                           - Continue present medications. Linzess was cost                            prohibitive and MiraLax and stool softeners                            incomplete effective. Trial Amitiza 8 mcg twice                            daily with food.                           - Office follow-up in 2-3 months for continuity and  to ensure improving symptoms.                           - Await pathology results.                           - Repeat colonoscopy is recommended for                            surveillance. The colonoscopy date will be                            determined after pathology results from today's                            exam become available for review. Jerene Bears, MD 02/19/2018 11:48:05 AM This report has been signed electronically.

## 2018-02-19 NOTE — Progress Notes (Signed)
Called to room to assist during endoscopic procedure.  Patient ID and intended procedure confirmed with present staff. Received instructions for my participation in the procedure from the performing physician.  

## 2018-02-19 NOTE — Progress Notes (Signed)
Pt's states no medical or surgical changes since previsit or office visit. 

## 2018-02-19 NOTE — Progress Notes (Signed)
Report to PACU, RN, vss, BBS= Clear.  

## 2018-02-19 NOTE — Op Note (Signed)
[place] Patient Name: Stephanie Parks Procedure Date: 02/19/2018 11:01 AM MRN: [id number] Endoscopist: Jerene Bears , MD Age: 73 Referring MD:  Date of Birth: Nov 21, 1945 Gender: Female Account #: [id number] Procedure:                Upper GI endoscopy Indications:              Gastro-esophageal reflux disease with heartburn                            despite once daily PPI Medicines:                Monitored Anesthesia Care Procedure:                Pre-Anesthesia Assessment:                           - Prior to the procedure, a History and Physical                            was performed, and patient medications and                            allergies were reviewed. The patient's tolerance of                            previous anesthesia was also reviewed. The risks                            and benefits of the procedure and the sedation                            options and risks were discussed with the patient.                            All questions were answered, and informed consent                            was obtained. Prior Anticoagulants: The patient has                            taken no previous anticoagulant or antiplatelet                            agents. ASA Grade Assessment: II - A patient with                            mild systemic disease. After reviewing the risks                            and benefits, the patient was deemed in                            satisfactory condition to undergo the procedure.  After obtaining informed consent, the endoscope was                            passed under direct vision. Throughout the                            procedure, the patient's blood pressure, pulse, and                            oxygen saturations were monitored continuously. The                            Endoscope was introduced through the mouth, and                            advanced to the second part of  duodenum. The upper                            GI endoscopy was accomplished without difficulty.                            The patient tolerated the procedure well. Scope In: Scope Out: Findings:                 The examined esophagus was normal.                           A small, 1-2 cm, hiatal hernia was present.                           The entire examined stomach was normal.                           The examined duodenum was normal. Complications:            No immediate complications. Estimated Blood Loss:     Estimated blood loss was minimal. Estimated blood                            loss: none. Impression:               - Normal esophagus.                           - Small hiatal hernia.                           - Normal stomach.                           - Normal examined duodenum.                           - No specimens collected. Recommendation:           - Patient has a contact number available for  emergencies. The signs and symptoms of potential                            delayed complications were discussed with the                            patient. Return to normal activities tomorrow.                            Written discharge instructions were provided to the                            patient.                           - Resume previous diet.                           - Continue present medications.                           - Dexilant was cost prohibitive. Resume                            pantoprazole 40 mg once daily and add famotidine 20                            mg each evening. Call my office if heartburn is                            still an issue with this acid control regimen. Jerene Bears, MD 02/19/2018 11:44:22 AM This report has been signed electronically.

## 2018-02-20 ENCOUNTER — Telehealth: Payer: Self-pay

## 2018-02-20 NOTE — Telephone Encounter (Signed)
  Follow up Call-  Call back number 02/19/2018  Post procedure Call Back phone  # [phone]  Permission to leave phone message Yes  Some recent data might be hidden     Patient questions:  Do you have a fever, pain , or abdominal swelling? No. Pain Score  0 *  Have you tolerated food without any problems? Yes.    Have you been able to return to your normal activities? Yes.    Do you have any questions about your discharge instructions: Diet   No. Medications  No. Follow up visit  No.  Do you have questions or concerns about your Care? No.  Actions: * If pain score is 4 or above: No action needed, pain <4.  No problems noted per pt. maw

## 2018-02-23 ENCOUNTER — Telehealth: Payer: Self-pay | Admitting: Internal Medicine

## 2018-02-23 NOTE — Telephone Encounter (Signed)
Increase amitiza to 24 mcg BID In the interim can use Magnesium citrate 1 bottle and if no BM in 2 hours, repeat 1 additional bottle

## 2018-02-23 NOTE — Telephone Encounter (Signed)
Pt states she has not had a BM since her colonoscopy on 02/19/18. Reports she has been taking the Amitiza 63mcg BID and it has not helped at all. Please advise.

## 2018-02-23 NOTE — Telephone Encounter (Signed)
Patient states she has not had a BM since colonoscopy on 1.2.2020 even with taking medication amitiza. Patient wanting advice.

## 2018-02-23 NOTE — Telephone Encounter (Signed)
Pt aware and samples left up front for pt to pickup.

## 2018-02-24 ENCOUNTER — Encounter: Payer: Self-pay | Admitting: Internal Medicine

## 2018-02-27 ENCOUNTER — Encounter: Payer: Self-pay | Admitting: Internal Medicine

## 2018-02-27 NOTE — Telephone Encounter (Signed)
Error

## 2018-03-02 ENCOUNTER — Telehealth: Payer: Self-pay | Admitting: Internal Medicine

## 2018-03-03 NOTE — Telephone Encounter (Signed)
I have left a voicemail for patient that I attempted a prior authorization for Amitiza as she stated it was not covered by insurance. [facility] responded by fax stating:  "This medication or product is on your plan's list of covered drugs. Prior authorization is not required at this time. If your pharmacy has questions regarding the processing of your prescription, please have them call the [facility] pharmacy help desk at [phone]. Please note: Formulary lowering, tiering exception, cost reduction and/or pre benefit determination review (including prospective Medicare hospice reviews) requests cannot be requested using this method of submission. Please contact us at [phone] instead."  Reference # [id number]  [facility] Prior Authorization Department Phone [phone].  I have advised that if she continues to have issues with coverage or if it is a different medication she is having issues with (She seemed unsure of which medication was the problem), she should call back.

## 2018-03-06 MED ORDER — LACTULOSE 20 GM/30ML PO SOLN: ORAL | 2 refills | Status: DC

## 2018-03-06 NOTE — Telephone Encounter (Signed)
Patient indicates that although Amitiza is covered, it was cost prohibitive. She would prefer to try lactulose. I advised we will send lactulose 30 ml for her to take 1-3 times daily. She may titrate the dose based on her bowel movements. She verbalizes understanding of this.

## 2018-03-06 NOTE — Telephone Encounter (Signed)
Attempted to reach patient but number is busy x 2. Will attempt to reach patient again later on today.

## 2018-03-06 NOTE — Telephone Encounter (Signed)
Pt called to inform that insurance approved oral solns only: Constulose, Enulose, Gavilyte and Lactulose.

## 2018-03-06 NOTE — Telephone Encounter (Signed)
Amitiza while on her med list may be cost prohibitive  Her options if Amitiza too expensive include MiraLAX 17 g once to twice daily, lactulose 30 ml 1-3 times daily, or senna 2 tablets 1-2 times daily

## 2018-03-06 NOTE — Addendum Note (Signed)
Addended by: Larina Bras on: 03/06/2018 04:57 PM   Modules accepted: Orders

## 2018-03-06 NOTE — Telephone Encounter (Signed)
Dr Hilarie Fredrickson, please advise.... Insurance tells [place] that Amitiza is on patient's plan. Patient insists that it is not and that they will only cover consulose, enulose, gavilyte and lactulose. I am unsure if maybe they are telling her those are the more affordable options (I know she told us a few weeks back that 45 dollars for suprep was out of her price range). Would any of the options she mentioned be helpful for her?

## 2018-03-13 ENCOUNTER — Other Ambulatory Visit: Payer: Self-pay | Admitting: Internal Medicine

## 2018-03-13 DIAGNOSIS — K5904 Chronic idiopathic constipation: Secondary | ICD-10-CM

## 2018-03-15 ENCOUNTER — Other Ambulatory Visit: Payer: Self-pay | Admitting: Family Medicine

## 2018-03-24 ENCOUNTER — Emergency Department (HOSPITAL_BASED_OUTPATIENT_CLINIC_OR_DEPARTMENT_OTHER): Payer: Medicare Other

## 2018-03-24 ENCOUNTER — Other Ambulatory Visit: Payer: Self-pay

## 2018-03-24 ENCOUNTER — Encounter (HOSPITAL_BASED_OUTPATIENT_CLINIC_OR_DEPARTMENT_OTHER): Payer: Self-pay | Admitting: *Deleted

## 2018-03-24 ENCOUNTER — Emergency Department (HOSPITAL_BASED_OUTPATIENT_CLINIC_OR_DEPARTMENT_OTHER)
Admission: EM | Admit: 2018-03-24 | Discharge: 2018-03-25 | Disposition: A | Discharge status: Home / Self Care | Payer: Medicare Other | Attending: Emergency Medicine | Admitting: Emergency Medicine

## 2018-03-24 DIAGNOSIS — K5732 Diverticulitis of large intestine without perforation or abscess without bleeding: Secondary | ICD-10-CM | POA: Diagnosis not present

## 2018-03-24 DIAGNOSIS — K5792 Diverticulitis of intestine, part unspecified, without perforation or abscess without bleeding: Secondary | ICD-10-CM

## 2018-03-24 DIAGNOSIS — K5900 Constipation, unspecified: Secondary | ICD-10-CM | POA: Insufficient documentation

## 2018-03-24 DIAGNOSIS — Z7982 Long term (current) use of aspirin: Secondary | ICD-10-CM | POA: Diagnosis not present

## 2018-03-24 DIAGNOSIS — Z79899 Other long term (current) drug therapy: Secondary | ICD-10-CM | POA: Insufficient documentation

## 2018-03-24 DIAGNOSIS — I10 Essential (primary) hypertension: Secondary | ICD-10-CM | POA: Diagnosis not present

## 2018-03-24 DIAGNOSIS — R1032 Left lower quadrant pain: Secondary | ICD-10-CM | POA: Insufficient documentation

## 2018-03-24 DIAGNOSIS — Z87891 Personal history of nicotine dependence: Secondary | ICD-10-CM | POA: Insufficient documentation

## 2018-03-24 DIAGNOSIS — R112 Nausea with vomiting, unspecified: Secondary | ICD-10-CM | POA: Diagnosis not present

## 2018-03-24 DIAGNOSIS — E876 Hypokalemia: Secondary | ICD-10-CM | POA: Insufficient documentation

## 2018-03-24 DIAGNOSIS — K5730 Diverticulosis of large intestine without perforation or abscess without bleeding: Secondary | ICD-10-CM | POA: Diagnosis not present

## 2018-03-24 LAB — CBC WITH DIFFERENTIAL/PLATELET
Abs Immature Granulocytes: 0.01 10*3/uL (ref 0.00–0.07)
Basophils Absolute: 0 10*3/uL (ref 0.0–0.1)
Basophils Relative: 0 %
Eosinophils Absolute: 0.2 10*3/uL (ref 0.0–0.5)
Eosinophils Relative: 3 %
HCT: 36.7 % (ref 36.0–46.0)
Hemoglobin: 11.5 g/dL — ABNORMAL LOW (ref 12.0–15.0)
Immature Granulocytes: 0 %
Lymphocytes Relative: 43 %
Lymphs Abs: 2.6 10*3/uL (ref 0.7–4.0)
MCH: 28.1 pg (ref 26.0–34.0)
MCHC: 31.3 g/dL (ref 30.0–36.0)
MCV: 89.7 fL (ref 80.0–100.0)
Monocytes Absolute: 0.5 10*3/uL (ref 0.1–1.0)
Monocytes Relative: 8 %
NEUTROS PCT: 46 %
Neutro Abs: 2.8 10*3/uL (ref 1.7–7.7)
Platelets: 323 10*3/uL (ref 150–400)
RBC: 4.09 MIL/uL (ref 3.87–5.11)
RDW: 14.1 % (ref 11.5–15.5)
WBC: 6.2 10*3/uL (ref 4.0–10.5)
nRBC: 0 % (ref 0.0–0.2)

## 2018-03-24 LAB — URINALYSIS, ROUTINE W REFLEX MICROSCOPIC
Bilirubin Urine: NEGATIVE
Glucose, UA: NEGATIVE mg/dL
Hgb urine dipstick: NEGATIVE
KETONES UR: NEGATIVE mg/dL
Leukocytes, UA: NEGATIVE
Nitrite: NEGATIVE
PH: 7 (ref 5.0–8.0)
Protein, ur: NEGATIVE mg/dL
Specific Gravity, Urine: <1.005 — ABNORMAL LOW (ref 1.005–1.030)

## 2018-03-24 LAB — COMPREHENSIVE METABOLIC PANEL
ALT: 15 U/L (ref 0–44)
AST: 21 U/L (ref 15–41)
Albumin: 3.4 g/dL — ABNORMAL LOW (ref 3.5–5.0)
Alkaline Phosphatase: 56 U/L (ref 38–126)
Anion gap: 8 (ref 5–15)
BUN: 13 mg/dL (ref 8–23)
CO2: 30 mmol/L (ref 22–32)
Calcium: 8 mg/dL — ABNORMAL LOW (ref 8.9–10.3)
Chloride: 100 mmol/L (ref 98–111)
Creatinine, Ser: 0.94 mg/dL (ref 0.44–1.00)
GFR calc Af Amer: >60 mL/min (ref >60)
GFR calc non Af Amer: >60 mL/min (ref >60)
Glucose, Bld: 99 mg/dL (ref 70–99)
Potassium: 2.7 mmol/L — CL (ref 3.5–5.1)
Sodium: 138 mmol/L (ref 135–145)
TOTAL PROTEIN: 7.1 g/dL (ref 6.5–8.1)
Total Bilirubin: 0.4 mg/dL (ref 0.3–1.2)

## 2018-03-24 LAB — LIPASE, BLOOD: Lipase: 37 U/L (ref 11–51)

## 2018-03-24 LAB — MAGNESIUM: Magnesium: 1.9 mg/dL (ref 1.7–2.4)

## 2018-03-24 MED ORDER — SODIUM CHLORIDE 0.9 % IV BOLUS
500.0000 mL | Freq: Once | INTRAVENOUS | Status: AC
  Administered 2018-03-24: 500 mL via INTRAVENOUS

## 2018-03-24 MED ORDER — POTASSIUM CHLORIDE 10 MEQ/100ML IV SOLN
10.0000 meq | INTRAVENOUS | Status: AC
  Administered 2018-03-24 (×2): 10 meq via INTRAVENOUS
  Filled 2018-03-24 (×2): qty 100

## 2018-03-24 MED ORDER — IOPAMIDOL (ISOVUE-300) INJECTION 61%
100.0000 mL | Freq: Once | INTRAVENOUS | Status: AC | PRN
  Administered 2018-03-24: 100 mL via INTRAVENOUS

## 2018-03-24 NOTE — ED Notes (Signed)
Slowed down potassium, pt reports discomfort in arm above IV site. Relieved after rate changed. Will continue to monitor.

## 2018-03-24 NOTE — ED Provider Notes (Signed)
[facility] Provider Note   CSN: [id number] Arrival date & time: 03/24/18  1740     History   Chief Complaint Chief Complaint  Patient presents with  . Emesis    HPI Stephanie Parks is a 73 y.o. female presenting for evaluation of nausea and vomiting.  Patient states that the past 2 days, she has been having persistent nausea and vomiting.  She has not been able to eat anything over the past 2 days, however today she was able to eat some grits without vomiting. She is still intermittent nausea.  She is unsure if she has had fevers or chills, as she states she frequently has hot flashes.  She reports constipation, which she has at baseline.  She used an enema today with minimal bowel movement. She reports lower abd cramping, mostly on the LLQ.  Pain is intermittent, nothing brings it on or makes it better.  She denies cough, chest pain, shortness of breath, urinary symptoms.  Additional history obtained from chart review.  Patient with recent EGD and colonoscopy 1 month ago.  History of diverticulosis, GERD, hiatal hernia, hypertension, and constipation.  HPI  Past Medical History:  Diagnosis Date  . Diverticulosis   . GERD (gastroesophageal reflux disease)   . Hiatal hernia   . Hyperlipidemia   . Hypertension   . Inguinal hernia   . Migraine headache   . Schatzki's ring     Patient Active Problem List   Diagnosis Date Noted  . Tendonitis, Achilles, right 12/08/2017  . Onychomycosis 12/08/2017  . Unilateral primary osteoarthritis, left knee 10/07/2017  . Ingrown toenail 08/10/2017  . Coughing 04/24/2017  . 3-vessel CAD 03/01/2015  . Aneurysm ([place]) 03/01/2015  . Dizziness 03/01/2015  . Hypercholesterolemia 03/01/2015  . Decreased potassium in the blood 03/01/2015  . Paresthesia of arm 03/01/2015  . Allergic rhinitis 03/01/2015  . Achalasia 09/28/2012  . CN (constipation) 09/28/2012  . Cephalalgia 08/24/2012  . HTN (hypertension)  04/19/2011  . Hyperlipidemia 04/19/2011  . GERD (gastroesophageal reflux disease) 04/19/2011  . Chest pain 04/04/2011    Past Surgical History:  Procedure Laterality Date  . ABDOMINAL HYSTERECTOMY    . BACK SURGERY     x 3  . CARPAL TUNNEL RELEASE     left wrist  . CERVICAL SPINE SURGERY    . HEMORRHOID SURGERY       OB History   No obstetric history on file.      Home Medications    Prior to Admission medications   Medication Sig Start Date End Date Taking? Authorizing Provider  alendronate (FOSAMAX) 10 MG tablet TAKE 1 TABLET BY MOUTH DAILY BEFORE BREAKFAST. TAKE WITH A FULL GLASS OF WATER ON EMPTY STOMACH 03/17/18   Copland, Gay Filler, MD  amoxicillin-clavulanate (AUGMENTIN) 875-125 MG tablet Take 1 tablet by mouth every 12 (twelve) hours for 7 days. 03/25/18 04/01/18  Jaan Fischel, PA-C  aspirin 81 MG tablet Take 81 mg by mouth daily.    [provider]  azelastine (ASTELIN) 0.1 % nasal spray Place 2 sprays into both nostrils 2 (two) times daily as needed for rhinitis. Use in each nostril as directed 04/24/17   Bobbitt, Sedalia Muta, MD  calcium carbonate (OS-CAL) 600 MG tablet Take 1 tablet (600 mg total) by mouth 2 (two) times daily with a meal. 09/20/16   Saguier, Percell Miller, PA-C  dexlansoprazole (DEXILANT) 60 MG capsule Take 1 capsule (60 mg total) by mouth daily. Patient not taking: Reported on 02/19/2018  01/23/18   Pyrtle, Lajuan Lines, MD  fluticasone (FLONASE) 50 MCG/ACT nasal spray USE 2 SPRAYS IN NOSTRIL(S) DAILY AS NEEDED. 01/14/18   Copland, Gay Filler, MD  KLOR-CON M20 20 MEQ tablet TAKE 1 TABLET BY MOUTH EVERY DAY 02/09/18   Copland, Gay Filler, MD  Lactulose 20 GM/30ML SOLN Take 30 ml (2 tablespoons) by mouth 1-3 times daily for constipation 03/06/18   Pyrtle, Lajuan Lines, MD  losartan-hydrochlorothiazide (HYZAAR) 50-12.5 MG tablet Take 1 tablet by mouth daily. 09/22/17   Copland, Gay Filler, MD  lovastatin (MEVACOR) 20 MG tablet Take 1 tablet (20 mg total) by mouth daily.  01/22/18   Copland, Gay Filler, MD  methocarbamol (ROBAXIN) 500 MG tablet Take 1 tablet (500 mg total) by mouth every 8 (eight) hours as needed for muscle spasms. Patient not taking: Reported on 02/19/2018 01/28/18   Copland, Gay Filler, MD  Multiple Vitamins-Minerals (MULTIVITAL) tablet Take by mouth.    [provider]  NON FORMULARY [facility]  Onychomycosis Nail Lacquer -  Fluconazole 2%, Terbinafine 1% DMSO Apply to affected nail once daily Qty. 120 gm 3 refills    [provider]  NONFORMULARY OR COMPOUNDED ITEM Allergen immunotherapy Patient not taking: Reported on 02/19/2018 03/01/15   Leda Roys, MD  nortriptyline (PAMELOR) 25 MG capsule TAKE 1 CAPSULE (25 MG TOTAL) BY MOUTH AT BEDTIME. 10/14/17   Copland, Gay Filler, MD  pantoprazole (PROTONIX) 40 MG tablet Take 1 tablet (40 mg total) by mouth daily. Take 30-60 minutes before breakfast. Patient not taking: Reported on 02/19/2018 10/30/17   Levin Erp, PA    Family History Family History  Problem Relation Age of Onset  . Allergic rhinitis Sister   . Diabetes Sister   . Hypertension Sister   . Heart attack Father 64  . Heart disease Father   . Stomach cancer Maternal Grandmother   . Heart disease Mother   . Colon cancer Neg Hx   . Angioedema Neg Hx   . Asthma Neg Hx   . Eczema Neg Hx   . Urticaria Neg Hx   . Immunodeficiency Neg Hx   . Breast cancer Neg Hx     Social History Social History   Tobacco Use  . Smoking status: Former Smoker    Packs/day: 0.50    Years: 20.00    Pack years: 10.00    Types: Cigarettes    Last attempt to quit: 02/19/1984    Years since quitting: 34.1  . Smokeless tobacco: Never Used  Substance Use Topics  . Alcohol use: No  . Drug use: No     Allergies   Shellfish allergy; Ace inhibitors; Pitavastatin; and Sulfa antibiotics   Review of Systems Review of Systems  Gastrointestinal: Positive for abdominal pain, constipation, nausea and vomiting.  All  other systems reviewed and are negative.    Physical Exam Updated Vital Signs BP 121/69 (BP Location: Right Arm)   Pulse 75   Temp 98.2 F (36.8 C)   Resp 18   Ht 5\' 5"  (1.651 m)   Wt 95.4 kg   SpO2 100%   BMI 35.00 kg/m   Physical Exam Vitals signs and nursing note reviewed.  Constitutional:      General: She is not in acute distress.    Appearance: She is well-developed.     Comments: Laying in the bed in NAD  HENT:     Head: Normocephalic and atraumatic.     Mouth/Throat:     Mouth: Mucous membranes  are dry.     Comments: MM dry Eyes:     Conjunctiva/sclera: Conjunctivae normal.     Pupils: Pupils are equal, round, and reactive to light.  Neck:     Musculoskeletal: Normal range of motion and neck supple.  Cardiovascular:     Rate and Rhythm: Normal rate and regular rhythm.  Pulmonary:     Effort: Pulmonary effort is normal. No respiratory distress.     Breath sounds: Normal breath sounds. No wheezing.  Abdominal:     General: There is no distension.     Palpations: Abdomen is soft. There is no mass.     Tenderness: There is abdominal tenderness. There is no right CVA tenderness, left CVA tenderness, guarding or rebound.     Comments: TTP of LLQ. No rigidity, guarding, or distention, negative rebound  Musculoskeletal: Normal range of motion.  Skin:    General: Skin is warm and dry.     Capillary Refill: Capillary refill takes less than 2 seconds.  Neurological:     Mental Status: She is alert and oriented to person, place, and time.      ED Treatments / Results  Labs (all labs ordered are listed, but only abnormal results are displayed) Labs Reviewed  URINALYSIS, ROUTINE W REFLEX MICROSCOPIC - Abnormal; Notable for the following components:      Result Value   Specific Gravity, Urine <1.005 (*)    All other components within normal limits  CBC WITH DIFFERENTIAL/PLATELET - Abnormal; Notable for the following components:   Hemoglobin 11.5 (*)    All  other components within normal limits  COMPREHENSIVE METABOLIC PANEL - Abnormal; Notable for the following components:   Potassium 2.7 (*)    Calcium 8.0 (*)    Albumin 3.4 (*)    All other components within normal limits  LIPASE, BLOOD  MAGNESIUM    EKG None  Radiology Ct Abdomen Pelvis W Contrast  Result Date: 03/24/2018 CLINICAL DATA:  73 year old female with 3 days of nausea vomiting. Pelvic pain. EXAM: CT ABDOMEN AND PELVIS WITH CONTRAST TECHNIQUE: Multidetector CT imaging of the abdomen and pelvis was performed using the standard protocol following bolus administration of intravenous contrast. CONTRAST:  113mL ISOVUE-300 IOPAMIDOL (ISOVUE-300) INJECTION 61% COMPARISON:  CT Abdomen and Pelvis 12/03/2017. FINDINGS: Lower chest: Incidental chronic post granulomatous changes in the right lower lung including small calcified right hilar lymph nodes. No acute findings. No pericardial or pleural effusion. Hepatobiliary: Stable and negative liver, small low-density probable benign cyst in the left lobe. Negative gallbladder. Pancreas: Negative. Spleen: Negative. Adrenals/Urinary Tract: Normal adrenal glands. Bilateral renal enhancement and contrast excretion is symmetric and within normal limits. Normal proximal ureters. No definite nephrolithiasis. Diminutive and unremarkable urinary bladder. Stomach/Bowel: Decompressed and negative rectum. Mild to moderate diverticulosis of the sigmoid colon, with mild active inflammation suggested on coronal image 70. Mild mesenteric stranding there about a diverticulum. No extraluminal gas or fluid. Oral contrast mixed with stool in the descending colon. Retained stool throughout redundant transverse colon. Mild diverticulosis in the right colon. Normal appendix (series 2, image 57). Mild diverticulosis of the terminal ileum which otherwise appears normal. No dilated small bowel. Small gastric hiatal hernia. Negative intraabdominal stomach. Negative duodenum. No  free air, free fluid. Vascular/Lymphatic: Mild calcified atherosclerosis. Major arterial structures in the abdomen and pelvis are patent. Portal venous system is patent. Reproductive: Surgically absent uterus. Normal ovaries. Other: No pelvic free fluid. Chronic fat containing left inguinal hernia is stable. Musculoskeletal: Advanced chronic lower thoracic and  lower lumbar disc degeneration. Chronic lumbar posterior element degeneration. No acute osseous abnormality identified. IMPRESSION: 1. Very mild acute sigmoid diverticulitis suspected, with a small area of focal diverticular inflammation demonstrated on coronal image 70. No complicating features. 2. No other acute or inflammatory process in the abdomen or pelvis. Moderate volume of retained stool in the colon. Electronically Signed   By: Genevie Ann M.D.   On: 03/24/2018 23:53    Procedures Procedures (including critical care time)  Medications Ordered in ED Medications  amoxicillin-clavulanate (AUGMENTIN) 875-125 MG per tablet 1 tablet (has no administration in time range)  sodium chloride 0.9 % bolus 500 mL (500 mLs Intravenous New Bag/Given 03/24/18 2213)  potassium chloride 10 mEq in 100 mL IVPB (0 mEq Intravenous Stopped 03/24/18 2347)  iopamidol (ISOVUE-300) 61 % injection 100 mL (100 mLs Intravenous Contrast Given 03/24/18 2314)     Initial Impression / Assessment and Plan / ED Course  I have reviewed the triage vital signs and the nursing notes.  Pertinent labs & imaging results that were available during my care of the patient were reviewed by me and considered in my medical decision making (see chart for details).     Pt presenting for evaluation nausea, vomiting, and lower abdominal pain.  Physical exam shows patient who appears nontoxic.  She does have tenderness palpation of left lower quadrant.  Appears dehydrated.  Labs without leukocytosis.  Kidney, liver, pancreatic function reassuring.  However, potassium is low at 2.7. mag  reassuring. hypokalemia likely 2/2 n/v. 2 runs of K given in the ED, will plan for PO at home. Will order CT to assess for bowel obstruction considering constipation, diverticulitis considering location of pain, or other intra-abdominal infection or acute findings.  CT shows early/mild diverticulitis. Discussed with pt. She is tolerating PO without difficulty. 1st dose abx given. Pt to take home potassium, discussed importance of this.  Pt to f/u with GI as needed. Strict return precautions given. At this time, pt appears safe for d/c.  Pt states she understands and agrees to plan.   Final Clinical Impressions(s) / ED Diagnoses   Final diagnoses:  Diverticulitis  Hypokalemia    ED Discharge Orders         Ordered    amoxicillin-clavulanate (AUGMENTIN) 875-125 MG tablet  Every 12 hours     03/25/18 0007           Franchot Heidelberg, PA-C 03/25/18 0014    Tegeler, Gwenyth Allegra, MD 03/25/18 [phone]

## 2018-03-24 NOTE — ED Triage Notes (Signed)
Pt c/o n/v x 3 days

## 2018-03-24 NOTE — ED Notes (Signed)
ED Provider at bedside. 

## 2018-03-24 NOTE — ED Notes (Signed)
Date and time results received: 03/24/18 1942 (use smartphrase ".now" to insert current time)  Test: potassium Critical Value: 2.7 Name of Provider Notified: Dr. Sherry Ruffing  Orders Received? Or Actions Taken?: no new orders at this time.

## 2018-03-25 MED ORDER — AMOXICILLIN-POT CLAVULANATE 875-125 MG PO TABS
1.0000 | ORAL_TABLET | Freq: Once | ORAL | Status: AC
  Administered 2018-03-25: 1 via ORAL
  Filled 2018-03-25: qty 1

## 2018-03-25 MED ORDER — ONDANSETRON 4 MG PO TBDP: 4.0000 mg | ORAL_TABLET | Freq: Three times a day (TID) | ORAL | 0 refills | Status: DC | PRN

## 2018-03-25 MED ORDER — AMOXICILLIN-POT CLAVULANATE 875-125 MG PO TABS: 1.0000 | ORAL_TABLET | Freq: Two times a day (BID) | ORAL | 0 refills | Status: AC

## 2018-03-25 NOTE — Discharge Instructions (Addendum)
Your CT scan today shows diverticulitis.  This will be treated with antibiotics. Use Tylenol as needed for pain control.  You may also try heating pads. It is important to you taking potassium as prescribed.  Also, try and increase the foods that have high potassium, information about this is in the paperwork. Use Zofran as needed for nausea or vomiting. Follow-up with your GI/stomach doctor if your symptoms are not improving in the next several days. Return to the emergency room if you develop high fevers, severe worsening pain, persistent vomiting despite medication, or any new, worsening, or concerning symptoms.

## 2018-03-26 ENCOUNTER — Other Ambulatory Visit: Payer: Self-pay | Admitting: Family Medicine

## 2018-03-26 DIAGNOSIS — I10 Essential (primary) hypertension: Secondary | ICD-10-CM

## 2018-04-23 ENCOUNTER — Ambulatory Visit (INDEPENDENT_AMBULATORY_CARE_PROVIDER_SITE_OTHER): Payer: Medicare Other | Admitting: Family Medicine

## 2018-04-23 ENCOUNTER — Ambulatory Visit (HOSPITAL_BASED_OUTPATIENT_CLINIC_OR_DEPARTMENT_OTHER)
Admission: RE | Admit: 2018-04-23 | Discharge: 2018-04-23 | Disposition: A | Discharge status: Home / Self Care | Payer: Medicare Other | Source: Ambulatory Visit | Attending: Family Medicine | Admitting: Family Medicine

## 2018-04-23 ENCOUNTER — Encounter: Payer: Self-pay | Admitting: Family Medicine

## 2018-04-23 VITALS — BP 118/60 | HR 93 | Temp 98.1°F | Ht 65.0 in | Wt 215.0 lb

## 2018-04-23 DIAGNOSIS — J40 Bronchitis, not specified as acute or chronic: Secondary | ICD-10-CM

## 2018-04-23 DIAGNOSIS — R05 Cough: Secondary | ICD-10-CM | POA: Diagnosis not present

## 2018-04-23 HISTORY — DX: Bronchitis, not specified as acute or chronic: J40

## 2018-04-23 MED ORDER — METHYLPREDNISOLONE ACETATE 80 MG/ML IJ SUSP
80.0000 mg | Freq: Once | INTRAMUSCULAR | Status: AC
  Administered 2018-04-23: 80 mg via INTRAMUSCULAR

## 2018-04-23 MED ORDER — AZITHROMYCIN 250 MG PO TABS: ORAL_TABLET | ORAL | 0 refills | Status: DC

## 2018-04-23 MED ORDER — PREDNISONE 10 MG PO TABS: ORAL_TABLET | ORAL | 0 refills | Status: DC

## 2018-04-23 NOTE — Patient Instructions (Signed)
Acute Bronchitis, Adult Acute bronchitis is when air tubes (bronchi) in the lungs suddenly get swollen. The condition can make it hard to breathe. It can also cause these symptoms:  A cough.  Coughing up clear, yellow, or green mucus.  Wheezing.  Chest congestion.  Shortness of breath.  A fever.  Body aches.  Chills.  A sore throat. Follow these instructions at home:  Medicines  Take over-the-counter and prescription medicines only as told by your doctor.  If you were prescribed an antibiotic medicine, take it as told by your doctor. Do not stop taking the antibiotic even if you start to feel better. General instructions  Rest.  Drink enough fluids to keep your pee (urine) pale yellow.  Avoid smoking and secondhand smoke. If you smoke and you need help quitting, ask your doctor. Quitting will help your lungs heal faster.  Use an inhaler, cool mist vaporizer, or humidifier as told by your doctor.  Keep all follow-up visits as told by your doctor. This is important. How is this prevented? To lower your risk of getting this condition again:  Wash your hands often with soap and water. If you cannot use soap and water, use hand sanitizer.  Avoid contact with people who have cold symptoms.  Try not to touch your hands to your mouth, nose, or eyes.  Make sure to get the flu shot every year. Contact a doctor if:  Your symptoms do not get better in 2 weeks. Get help right away if:  You cough up blood.  You have chest pain.  You have very bad shortness of breath.  You become dehydrated.  You faint (pass out) or keep feeling like you are going to pass out.  You keep throwing up (vomiting).  You have a very bad headache.  Your fever or chills gets worse. This information is not intended to replace advice given to you by your health care provider. Make sure you discuss any questions you have with your health care provider. Document Released: 07/24/2007 Document  Revised: 09/18/2016 Document Reviewed: 07/26/2015 Elsevier Interactive Patient Education  2019 [employer]  

## 2018-04-23 NOTE — Progress Notes (Signed)
Patient ID: Stephanie Parks, female    DOB: 1945-05-29  Age: 73 y.o. MRN: [id number]    Subjective:  Subjective  HPI Stephanie Parks presents for cough x 2 months, non productive,  She has used otc mucinex with no relief   Review of Systems  Constitutional: Negative for chills and fever.  HENT: Positive for congestion and postnasal drip. Negative for rhinorrhea and sinus pressure.   Respiratory: Positive for cough, chest tightness, shortness of breath and wheezing.   Cardiovascular: Negative for chest pain, palpitations and leg swelling.  Allergic/Immunologic: Negative for environmental allergies.    History Past Medical History:  Diagnosis Date  . Diverticulosis   . GERD (gastroesophageal reflux disease)   . Hiatal hernia   . Hyperlipidemia   . Hypertension   . Inguinal hernia   . Migraine headache   . Schatzki's ring     She has a past surgical history that includes Back surgery; Cervical spine surgery; Abdominal hysterectomy; Carpal tunnel release; and Hemorrhoid surgery.   Her family history includes Allergic rhinitis in her sister; Diabetes in her sister; Heart attack (age of onset: 15) in her father; Heart disease in her father and mother; Hypertension in her sister; Stomach cancer in her maternal grandmother.She reports that she quit smoking about 34 years ago. Her smoking use included cigarettes. She has a 10.00 pack-year smoking history. She has never used smokeless tobacco. She reports that she does not drink alcohol or use drugs.  Current Outpatient Medications on File Prior to Visit  Medication Sig Dispense Refill  . alendronate (FOSAMAX) 10 MG tablet TAKE 1 TABLET BY MOUTH DAILY BEFORE BREAKFAST. TAKE WITH A FULL GLASS OF WATER ON EMPTY STOMACH 90 tablet 1  . aspirin 81 MG tablet Take 81 mg by mouth daily.    Marland Kitchen azelastine (ASTELIN) 0.1 % nasal spray Place 2 sprays into both nostrils 2 (two) times daily as needed for rhinitis. Use in each nostril as directed 30 mL 5    . calcium carbonate (OS-CAL) 600 MG tablet Take 1 tablet (600 mg total) by mouth 2 (two) times daily with a meal. 60 tablet 0  . fluticasone (FLONASE) 50 MCG/ACT nasal spray USE 2 SPRAYS IN NOSTRIL(S) DAILY AS NEEDED. 16 g 9  . KLOR-CON M20 20 MEQ tablet TAKE 1 TABLET BY MOUTH EVERY DAY 90 tablet 1  . Lactulose 20 GM/30ML SOLN Take 30 ml (2 tablespoons) by mouth 1-3 times daily for constipation 2700 mL 2  . losartan-hydrochlorothiazide (HYZAAR) 50-12.5 MG tablet TAKE 1 TABLET BY MOUTH EVERY DAY 90 tablet 1  . lovastatin (MEVACOR) 20 MG tablet Take 1 tablet (20 mg total) by mouth daily. 90 tablet 1  . methocarbamol (ROBAXIN) 500 MG tablet Take 1 tablet (500 mg total) by mouth every 8 (eight) hours as needed for muscle spasms. 30 tablet 0  . Multiple Vitamins-Minerals (MULTIVITAL) tablet Take by mouth.    [name] [facility]  Onychomycosis Nail Lacquer -  Fluconazole 2%, Terbinafine 1% DMSO Apply to affected nail once daily Qty. 120 gm 3 refills    . NONFORMULARY OR COMPOUNDED ITEM Allergen immunotherapy 2 each 1  . nortriptyline (PAMELOR) 25 MG capsule TAKE 1 CAPSULE (25 MG TOTAL) BY MOUTH AT BEDTIME. 90 capsule 1  . ondansetron (ZOFRAN ODT) 4 MG disintegrating tablet Take 1 tablet (4 mg total) by mouth every 8 (eight) hours as needed for nausea or vomiting. 20 tablet 0  . pantoprazole (PROTONIX) 40 MG tablet Take 1 tablet (  40 mg total) by mouth daily. Take 30-60 minutes before breakfast. 90 tablet 2  . dexlansoprazole (DEXILANT) 60 MG capsule Take 1 capsule (60 mg total) by mouth daily. (Patient not taking: Reported on 04/23/2018) 90 capsule 1   No current facility-administered medications on file prior to visit.      Objective:  Objective  Physical Exam Vitals signs and nursing note reviewed.  Constitutional:      Appearance: She is well-developed.  HENT:     Right Ear: Ear canal and external ear normal. Decreased hearing noted. A middle ear effusion is present.     Left  Ear: Tympanic membrane, ear canal and external ear normal.     Nose: No congestion or rhinorrhea.     Mouth/Throat:     Mouth: Mucous membranes are moist.     Pharynx: Oropharynx is clear. No posterior oropharyngeal erythema.  Eyes:     General:        Right eye: No discharge.        Left eye: No discharge.     Conjunctiva/sclera: Conjunctivae normal.  Cardiovascular:     Rate and Rhythm: Normal rate and regular rhythm.     Heart sounds: Normal heart sounds. No murmur.  Pulmonary:     Effort: Pulmonary effort is normal. No respiratory distress.     Breath sounds: Wheezing present. No rales.  Chest:     Chest wall: No tenderness.  Lymphadenopathy:     Cervical: Cervical adenopathy present.  Neurological:     Mental Status: She is alert and oriented to person, place, and time.    BP 118/60   Pulse 93   Temp 98.1 F (36.7 C)   Ht 5\' 5"  (1.651 m)   Wt 215 lb (97.5 kg)   SpO2 98%   BMI 35.78 kg/m  Wt Readings from Last 3 Encounters:  04/23/18 215 lb (97.5 kg)  03/24/18 210 lb 5.1 oz (95.4 kg)  01/28/18 219 lb (99.3 kg)     Lab Results  Component Value Date   WBC 6.2 03/24/2018   HGB 11.5 (L) 03/24/2018   HCT 36.7 03/24/2018   PLT 323 03/24/2018   GLUCOSE 99 03/24/2018   CHOL 189 05/01/2016   TRIG 149.0 05/01/2016   HDL 60.60 05/01/2016   LDLCALC 98 05/01/2016   ALT 15 03/24/2018   AST 21 03/24/2018   NA 138 03/24/2018   K 2.7 (LL) 03/24/2018   CL 100 03/24/2018   CREATININE 0.94 03/24/2018   BUN 13 03/24/2018   CO2 30 03/24/2018   TSH 2.12 06/14/2016   HGBA1C 5.9 05/01/2016    Ct Abdomen Pelvis W Contrast  Result Date: 03/24/2018 CLINICAL DATA:  73 year old female with 3 days of nausea vomiting. Pelvic pain. EXAM: CT ABDOMEN AND PELVIS WITH CONTRAST TECHNIQUE: Multidetector CT imaging of the abdomen and pelvis was performed using the standard protocol following bolus administration of intravenous contrast. CONTRAST:  11mL ISOVUE-300 IOPAMIDOL (ISOVUE-300)  INJECTION 61% COMPARISON:  CT Abdomen and Pelvis 12/03/2017. FINDINGS: Lower chest: Incidental chronic post granulomatous changes in the right lower lung including small calcified right hilar lymph nodes. No acute findings. No pericardial or pleural effusion. Hepatobiliary: Stable and negative liver, small low-density probable benign cyst in the left lobe. Negative gallbladder. Pancreas: Negative. Spleen: Negative. Adrenals/Urinary Tract: Normal adrenal glands. Bilateral renal enhancement and contrast excretion is symmetric and within normal limits. Normal proximal ureters. No definite nephrolithiasis. Diminutive and unremarkable urinary bladder. Stomach/Bowel: Decompressed and negative rectum. Mild to moderate  diverticulosis of the sigmoid colon, with mild active inflammation suggested on coronal image 70. Mild mesenteric stranding there about a diverticulum. No extraluminal gas or fluid. Oral contrast mixed with stool in the descending colon. Retained stool throughout redundant transverse colon. Mild diverticulosis in the right colon. Normal appendix (series 2, image 57). Mild diverticulosis of the terminal ileum which otherwise appears normal. No dilated small bowel. Small gastric hiatal hernia. Negative intraabdominal stomach. Negative duodenum. No free air, free fluid. Vascular/Lymphatic: Mild calcified atherosclerosis. Major arterial structures in the abdomen and pelvis are patent. Portal venous system is patent. Reproductive: Surgically absent uterus. Normal ovaries. Other: No pelvic free fluid. Chronic fat containing left inguinal hernia is stable. Musculoskeletal: Advanced chronic lower thoracic and lower lumbar disc degeneration. Chronic lumbar posterior element degeneration. No acute osseous abnormality identified. IMPRESSION: 1. Very mild acute sigmoid diverticulitis suspected, with a small area of focal diverticular inflammation demonstrated on coronal image 70. No complicating features. 2. No other  acute or inflammatory process in the abdomen or pelvis. Moderate volume of retained stool in the colon. Electronically Signed   By: Genevie Ann M.D.   On: 03/24/2018 23:53     Assessment & Plan:  Plan  I am having Stephanie Parks start on predniSONE and azithromycin. I am also having her maintain her aspirin, Multivital, NONFORMULARY OR COMPOUNDED ITEM, calcium carbonate, azelastine, NON FORMULARY, nortriptyline, pantoprazole, fluticasone, lovastatin, dexlansoprazole, methocarbamol, Klor-Con M20, Lactulose, alendronate, ondansetron, and losartan-hydrochlorothiazide. We administered methylPREDNISolone acetate.  Meds ordered this encounter  Medications  . predniSONE (DELTASONE) 10 MG tablet    Sig: TAKE 3 TABLETS PO QD FOR 3 DAYS THEN TAKE 2 TABLETS PO QD FOR 3 DAYS THEN TAKE 1 TABLET PO QD FOR 3 DAYS THEN TAKE 1/2 TAB PO QD FOR 3 DAYS    Dispense:  20 tablet    Refill:  0  . azithromycin (ZITHROMAX Z-PAK) 250 MG tablet    Sig: As directed    Dispense:  6 each    Refill:  0  . methylPREDNISolone acetate (DEPO-MEDROL) injection 80 mg    Problem List Items Addressed This Visit      Unprioritized   Bronchitis - Primary    cxr , abx per orders Depo medrol  pred taper con't mucinex prn rto prn      Relevant Medications   predniSONE (DELTASONE) 10 MG tablet   azithromycin (ZITHROMAX Z-PAK) 250 MG tablet   methylPREDNISolone acetate (DEPO-MEDROL) injection 80 mg (Completed)   Other Relevant Orders   DG Chest 2 View (Completed)      Follow-up: Return if symptoms worsen or fail to improve.  Ann Held, DO

## 2018-04-23 NOTE — Assessment & Plan Note (Signed)
cxr , abx per orders Depo medrol  pred taper con't mucinex prn rto prn

## 2018-05-04 DIAGNOSIS — Z7982 Long term (current) use of aspirin: Secondary | ICD-10-CM | POA: Diagnosis not present

## 2018-05-04 DIAGNOSIS — I10 Essential (primary) hypertension: Secondary | ICD-10-CM | POA: Diagnosis not present

## 2018-05-04 DIAGNOSIS — R079 Chest pain, unspecified: Secondary | ICD-10-CM | POA: Diagnosis not present

## 2018-05-04 DIAGNOSIS — E785 Hyperlipidemia, unspecified: Secondary | ICD-10-CM | POA: Insufficient documentation

## 2018-05-04 DIAGNOSIS — I517 Cardiomegaly: Secondary | ICD-10-CM | POA: Diagnosis not present

## 2018-05-04 DIAGNOSIS — Z882 Allergy status to sulfonamides status: Secondary | ICD-10-CM | POA: Diagnosis not present

## 2018-05-04 DIAGNOSIS — K219 Gastro-esophageal reflux disease without esophagitis: Secondary | ICD-10-CM | POA: Diagnosis not present

## 2018-05-04 DIAGNOSIS — Z91013 Allergy to seafood: Secondary | ICD-10-CM | POA: Diagnosis not present

## 2018-05-04 DIAGNOSIS — R0789 Other chest pain: Secondary | ICD-10-CM | POA: Diagnosis not present

## 2018-05-04 DIAGNOSIS — R9431 Abnormal electrocardiogram [ECG] [EKG]: Secondary | ICD-10-CM | POA: Diagnosis not present

## 2018-05-04 DIAGNOSIS — R200 Anesthesia of skin: Secondary | ICD-10-CM | POA: Diagnosis not present

## 2018-05-04 DIAGNOSIS — I2510 Atherosclerotic heart disease of native coronary artery without angina pectoris: Secondary | ICD-10-CM | POA: Diagnosis not present

## 2018-05-04 DIAGNOSIS — I071 Rheumatic tricuspid insufficiency: Secondary | ICD-10-CM | POA: Diagnosis not present

## 2018-05-04 DIAGNOSIS — Z87891 Personal history of nicotine dependence: Secondary | ICD-10-CM | POA: Diagnosis not present

## 2018-05-04 DIAGNOSIS — I729 Aneurysm of unspecified site: Secondary | ICD-10-CM | POA: Diagnosis not present

## 2018-05-04 DIAGNOSIS — R202 Paresthesia of skin: Secondary | ICD-10-CM | POA: Diagnosis not present

## 2018-05-04 DIAGNOSIS — R072 Precordial pain: Secondary | ICD-10-CM | POA: Diagnosis not present

## 2018-05-04 DIAGNOSIS — Z79899 Other long term (current) drug therapy: Secondary | ICD-10-CM | POA: Diagnosis not present

## 2018-05-05 MED ORDER — LOSARTAN POTASSIUM 25 MG PO TABS
12.50 | ORAL_TABLET | ORAL | Status: DC
Start: 2018-05-05 — End: 2018-05-05

## 2018-05-05 MED ORDER — POTASSIUM CHLORIDE CRYS ER 20 MEQ PO TBCR
20.00 | EXTENDED_RELEASE_TABLET | ORAL | Status: DC
Start: 2018-05-04 — End: 2018-05-05

## 2018-05-05 MED ORDER — PANTOPRAZOLE SODIUM 40 MG PO TBEC
40.00 | DELAYED_RELEASE_TABLET | ORAL | Status: DC
Start: 2018-05-05 — End: 2018-05-05

## 2018-05-05 MED ORDER — ACETAMINOPHEN 650 MG RE SUPP
650.00 | RECTAL | Status: DC
Start: ? — End: 2018-05-05

## 2018-05-05 MED ORDER — PRAVASTATIN SODIUM 20 MG PO TABS
20.00 | ORAL_TABLET | ORAL | Status: DC
Start: ? — End: 2018-05-05

## 2018-05-05 MED ORDER — NORTRIPTYLINE HCL 25 MG PO CAPS
25.00 | ORAL_CAPSULE | ORAL | Status: DC
Start: ? — End: 2018-05-05

## 2018-05-05 MED ORDER — LABETALOL HCL 5 MG/ML IV SOLN
10.00 | INTRAVENOUS | Status: DC
Start: ? — End: 2018-05-05

## 2018-05-05 MED ORDER — ACETAMINOPHEN 325 MG PO TABS
650.00 | ORAL_TABLET | ORAL | Status: DC
Start: ? — End: 2018-05-05

## 2018-05-05 MED ORDER — CALCIUM CARBONATE 1250 (500 CA) MG PO TABS
1250.00 | ORAL_TABLET | ORAL | Status: DC
Start: 2018-05-05 — End: 2018-05-05

## 2018-05-05 MED ORDER — SODIUM CHLORIDE 0.9 % IV SOLN
10.00 | INTRAVENOUS | Status: DC
Start: ? — End: 2018-05-05

## 2018-05-05 MED ORDER — NITROGLYCERIN 0.4 MG SL SUBL
0.40 | SUBLINGUAL_TABLET | SUBLINGUAL | Status: DC
Start: ? — End: 2018-05-05

## 2018-05-05 MED ORDER — GENERIC EXTERNAL MEDICATION
10.00 | Status: DC
Start: ? — End: 2018-05-05

## 2018-05-05 MED ORDER — ASPIRIN EC 81 MG PO TBEC
81.00 | DELAYED_RELEASE_TABLET | ORAL | Status: DC
Start: 2018-05-05 — End: 2018-05-05

## 2018-05-05 MED ORDER — CALCIUM CARBONATE ANTACID 500 MG PO CHEW
500.00 | CHEWABLE_TABLET | ORAL | Status: DC
Start: ? — End: 2018-05-05

## 2018-05-05 MED ORDER — GENERIC EXTERNAL MEDICATION
650.00 | Status: DC
Start: ? — End: 2018-05-05

## 2018-05-05 MED ORDER — ENOXAPARIN SODIUM 40 MG/0.4ML ~~LOC~~ SOLN
40.00 | SUBCUTANEOUS | Status: DC
Start: 2018-05-05 — End: 2018-05-05

## 2018-05-05 MED ORDER — ALUM & MAG HYDROXIDE-SIMETH 200-200-20 MG/5ML PO SUSP
30.00 | ORAL | Status: DC
Start: ? — End: 2018-05-05

## 2018-05-05 NOTE — Progress Notes (Signed)
Vial exp 05-05-2019

## 2018-05-05 NOTE — Progress Notes (Signed)
Stephanie Parks at [facility] [address], [place], [place] [address] [phone]  Date:  05/07/2018   Name:  Stephanie Parks   DOB:  Dec 30, 1945   MRN:  [id number]  PCP:  [name], MD    Chief Complaint: Hospitalization Follow-up (chest pain, was told to stop losartan but does not want to stop-unsure why she was told to stop)   History of Present Illness:  Stephanie Parks is a 73 y.o. very pleasant female patient who presents with the following:  Here today for hospital follow-up visit She was seen in the [facility] emergency department on 3/16-she has been awakened from sleep with palpitations, and had some chest discomfort.  She was observed overnight in the ER on telemetry  Precordial chest pain monitor on cards tele -cycle troponins x3 -risk stratify with fasting lipid profile, HgbA1c, TSH -EKG on admission revealed -normal sinus rhythm with some nonspecific T changes in the inferior lateral leads -Continue ASA, statin, will consider starting patient on a beta-blocker -NPO p midnight for stress test in morning if troponins remain negative, if troponins however become positive we will obtain consult with cardiology for possible left heart catheterization -prn NTG and prn Morphine for chest pain  Essential hypertension uncontrolled, probably secondary to acute stress anxiety about what is going on - Resume home BP medications-Cozaar, may need to place patient on an additional antihypertensive if blood pressure continues to run high despite being on her Cozaar - Hold home BP medications for blood pressure less than 110/60 - Place on cardiac diet. -Start on IV labetalol 10 mg every 4 hours as needed for blood pressure greater than 180/110  GERD (gastroesophageal reflux disease) Continue patient on her current dose of PPI or its formulary equivalent  Paresthesias -This has since subsided and I suspect is related  to her chest discomfort. -She did not exhibit any focal neurologic deficit -We will obtain neuro exams on her every 4 hours for the next 4 to 48 hours  Dyslipidemia We will continue patient on her statin or its formulary equivalent Will obtain a lipid profile and adjust her statin dose if cholesterol profile is not at goal  Leukocytosis, unspecified type Leukocytosis secondary to no clear infectious etiology. Suspect that this is secondary to acute stress - treat underlying cause  - IV antibiotics: Not indicated at this time - Repeat CBC with diff in the morning.   They had her stop losartan 25 and added losartan hctz 50/12.5  BP is good today  Trop negative x3 tsh was high   She did not end up having a stress as the "machine was broken" She did have an echo however as follows: Interpretation Summary A complete two-dimensional transthoracic echocardiogram was performed (2D, M-mode, Doppler and color flow Doppler). The study was technically adequate. There is mild concentric left ventricular hypertrophy. Ejection Fraction = >55%. The transmitral spectral Doppler flow pattern is suggestive of impaired LV relaxation. There is mild tricuspid regurgitation. Right ventricular systolic pressure is elevated at 30-69mmHg.  Left Ventricle The left ventricle is normal in size. There is mild concentric left ventricular hypertrophy. Proximal septal thickening is noted. Left ventricular systolic function is normal. Ejection Fraction = >55%. The transmitral spectral Doppler flow pattern is suggestive of impaired LV relaxation. The left ventricular wall motion is normal.  She has not had any more sx of chest pain The numbness in her hand and jaw are also resolved  She has noted some acid reflux and pain in her legs Acid reflux = she notes a burning feeling in her epigastric area, excessive belching. She is taking protonix per her GI doc She has some tums and will try these as well  She  has seen Dr. Angelena Form in the past - several years ago- and would like to go back and follow-up with him  She notes that her legs were aching the day or so after she got home from the hospital- now better as of this am  Patient Active Problem List   Diagnosis Date Noted  . Bronchitis 04/23/2018  . Tendonitis, Achilles, right 12/08/2017  . Onychomycosis 12/08/2017  . Unilateral primary osteoarthritis, left knee 10/07/2017  . Ingrown toenail 08/10/2017  . Coughing 04/24/2017  . 3-vessel CAD 03/01/2015  . Aneurysm ([place]) 03/01/2015  . Dizziness 03/01/2015  . Hypercholesterolemia 03/01/2015  . Decreased potassium in the blood 03/01/2015  . Paresthesia of arm 03/01/2015  . Allergic rhinitis 03/01/2015  . Achalasia 09/28/2012  . CN (constipation) 09/28/2012  . Cephalalgia 08/24/2012  . HTN (hypertension) 04/19/2011  . Hyperlipidemia 04/19/2011  . GERD (gastroesophageal reflux disease) 04/19/2011  . Chest pain 04/04/2011    Past Medical History:  Diagnosis Date  . Diverticulosis   . GERD (gastroesophageal reflux disease)   . Hiatal hernia   . Hyperlipidemia   . Hypertension   . Inguinal hernia   . Migraine headache   . Schatzki's ring     Past Surgical History:  Procedure Laterality Date  . ABDOMINAL HYSTERECTOMY    . BACK SURGERY     x 3  . CARPAL TUNNEL RELEASE     left wrist  . CERVICAL SPINE SURGERY    . HEMORRHOID SURGERY      Social History   Tobacco Use  . Smoking status: Former Smoker    Packs/day: 0.50    Years: 20.00    Pack years: 10.00    Types: Cigarettes    Last attempt to quit: 02/19/1984    Years since quitting: 34.2  . Smokeless tobacco: Never Used  Substance Use Topics  . Alcohol use: No  . Drug use: No    Family History  Problem Relation Age of Onset  . Allergic rhinitis Sister   . Diabetes Sister   . Hypertension Sister   . Heart attack Father 22  . Heart disease Father   . Stomach cancer Maternal Grandmother   . Heart disease Mother    . Colon cancer Neg Hx   . Angioedema Neg Hx   . Asthma Neg Hx   . Eczema Neg Hx   . Urticaria Neg Hx   . Immunodeficiency Neg Hx   . Breast cancer Neg Hx     Allergies  Allergen Reactions  . Shellfish Allergy Hives and Swelling  . Ace Inhibitors Other (See Comments)  . Pitavastatin   . Sulfa Antibiotics Other (See Comments) and Rash    Fine bumps    Medication list has been reviewed and updated.  Current Outpatient Medications on File Prior to Visit  Medication Sig Dispense Refill  . alendronate (FOSAMAX) 10 MG tablet TAKE 1 TABLET BY MOUTH DAILY BEFORE BREAKFAST. TAKE WITH A FULL GLASS OF WATER ON EMPTY STOMACH 90 tablet 1  . aspirin 81 MG tablet Take 81 mg by mouth daily.    Marland Kitchen azelastine (ASTELIN) 0.1 % nasal spray Place 2 sprays into both nostrils 2 (two) times daily as needed for rhinitis. Use in each  nostril as directed 30 mL 5  . calcium carbonate (OS-CAL) 600 MG tablet Take 1 tablet (600 mg total) by mouth 2 (two) times daily with a meal. 60 tablet 0  . dexlansoprazole (DEXILANT) 60 MG capsule Take 1 capsule (60 mg total) by mouth daily. 90 capsule 1  . fluticasone (FLONASE) 50 MCG/ACT nasal spray USE 2 SPRAYS IN NOSTRIL(S) DAILY AS NEEDED. 16 g 9  . KLOR-CON M20 20 MEQ tablet TAKE 1 TABLET BY MOUTH EVERY DAY 90 tablet 1  . Lactulose 20 GM/30ML SOLN Take 30 ml (2 tablespoons) by mouth 1-3 times daily for constipation 2700 mL 2  . losartan-hydrochlorothiazide (HYZAAR) 50-12.5 MG tablet TAKE 1 TABLET BY MOUTH EVERY DAY 90 tablet 1  . lovastatin (MEVACOR) 20 MG tablet Take 1 tablet (20 mg total) by mouth daily. 90 tablet 1  . methocarbamol (ROBAXIN) 500 MG tablet Take 1 tablet (500 mg total) by mouth every 8 (eight) hours as needed for muscle spasms. 30 tablet 0  . Multiple Vitamins-Minerals (MULTIVITAL) tablet Take by mouth.    [name] [facility]  Onychomycosis Nail Lacquer -  Fluconazole 2%, Terbinafine 1% DMSO Apply to affected nail once daily Qty. 120  gm 3 refills    . NONFORMULARY OR COMPOUNDED ITEM Allergen immunotherapy 2 each 1  . nortriptyline (PAMELOR) 25 MG capsule TAKE 1 CAPSULE (25 MG TOTAL) BY MOUTH AT BEDTIME. 90 capsule 1  . ondansetron (ZOFRAN ODT) 4 MG disintegrating tablet Take 1 tablet (4 mg total) by mouth every 8 (eight) hours as needed for nausea or vomiting. 20 tablet 0  . pantoprazole (PROTONIX) 40 MG tablet Take 1 tablet (40 mg total) by mouth daily. Take 30-60 minutes before breakfast. 90 tablet 2   No current facility-administered medications on file prior to visit.     Review of Systems:  As per HPI- otherwise negative. No fever or chills, no rash    Physical Examination: Vitals:   05/07/18 1109  BP: 130/66  Pulse: 86  Resp: 16  Temp: 98.2 F (36.8 C)  SpO2: 97%   Vitals:   05/07/18 1109  Weight: 215 lb (97.5 kg)  Height: 5\' 5"  (1.651 m)   Body mass index is 35.78 kg/m. Ideal Body Weight: Weight in (lb) to have BMI = 25: 149.9  GEN: WDWN, NAD, Non-toxic, A & O x 3, obese, looks well HEENT: Atraumatic, Normocephalic. Neck supple. No masses, No LAD.  Bilateral TM wnl, oropharynx normal.  PEERL,EOMI.   Ears and Nose: No external deformity. CV: RRR, No M/G/R. No JVD. No thrill. No extra heart sounds. PULM: CTA B, no wheezes, crackles, rhonchi. No retractions. No resp. distress. No accessory muscle use. ABD: S, NT, ND, +BS. No rebound. No HSM.  Belly is benign EXTR: No c/c/e NEURO Normal gait.  PSYCH: Normally interactive. Conversant. Not depressed or anxious appearing.  Calm demeanor.    Assessment and Plan: Hospital discharge follow-up - Plan: CBC, Basic metabolic panel  Chest pain, unspecified type  Muscle pain - Plan: CK  Abnormal TSH - Plan: TSH  Following up from recent hospital observation for chest pain.  She ruled out with troponin, but did not yet have a stress test.  I will refer her back to [facility] cardiology Chest pain is now resolved Noted abnormal TSH on her outside labs.   Will repeat today She has noted some unusual aching in her legs.  We will check a CK for her Blood pressure looks good on her current regimen,  check BMP as diuretic is new  Signed Lamar Blinks, MD  Received her labs Minimal anemia stable x10 years, colonoscopy done January 2020  Results for orders placed or performed in visit on 05/07/18  CBC  Result Value Ref Range   WBC 8.6 4.0 - 10.5 K/uL   RBC 3.90 3.87 - 5.11 Mil/uL   Platelets 333.0 150.0 - 400.0 K/uL   Hemoglobin 11.5 (L) 12.0 - 15.0 g/dL   HCT 34.4 (L) 36.0 - 46.0 %   MCV 88.3 78.0 - 100.0 fl   MCHC 33.5 30.0 - 36.0 g/dL   RDW 16.0 (H) 11.5 - 22.2 %  Basic metabolic panel  Result Value Ref Range   Sodium 141 135 - 145 mEq/L   Potassium 4.0 3.5 - 5.1 mEq/L   Chloride 101 96 - 112 mEq/L   CO2 34 (H) 19 - 32 mEq/L   Glucose, Bld 107 (H) 70 - 99 mg/dL   BUN 12 6 - 23 mg/dL   Creatinine, Ser 0.88 0.40 - 1.20 mg/dL   Calcium 9.4 8.4 - 10.5 mg/dL   GFR 76.30 >60.00 mL/min  TSH  Result Value Ref Range   TSH 2.93 0.35 - 4.50 uIU/mL  CK  Result Value Ref Range   Total CK 180 (H) 7 - 177 U/L   Letter to patient   BP Readings from Last 3 Encounters:  05/07/18 130/66  04/23/18 118/60  03/24/18 121/69

## 2018-05-06 ENCOUNTER — Telehealth: Payer: Self-pay | Admitting: Family Medicine

## 2018-05-06 NOTE — Telephone Encounter (Signed)
Left message to return call. [place] for pec to discuss if she has any questions.

## 2018-05-06 NOTE — Telephone Encounter (Signed)
Copied from [place] [id number]. Topic: Quick Communication - See Telephone Encounter >> May 06, 2018  9:19 AM Bea Graff, NT wrote: CRM for notification. See Telephone encounter for: 05/06/18. Pt states that she was in the ER on Sunday and they suggested her to stop her losartan and she wants to discuss with Dr. Lorelei Pont. Pt also states she did not get the results of her [place] she had done and wanting to see if the nurse can discuss that with her. She states she is also needing a referral to see her cardiologist (she cannot remember the name) as it has been so long since she has seen them. She was seen at [place].

## 2018-05-06 NOTE — Telephone Encounter (Signed)
Reviewed her ER notes - [facility]   She has an appt tomorrow- is she going to come in?  Will ask my nurse to call her  If she is coming in tomorrow can discuss her concerns then

## 2018-05-07 ENCOUNTER — Encounter: Payer: Self-pay | Admitting: Family Medicine

## 2018-05-07 ENCOUNTER — Ambulatory Visit (INDEPENDENT_AMBULATORY_CARE_PROVIDER_SITE_OTHER): Payer: Medicare Other | Admitting: Family Medicine

## 2018-05-07 ENCOUNTER — Other Ambulatory Visit: Payer: Self-pay

## 2018-05-07 VITALS — BP 130/66 | HR 86 | Temp 98.2°F | Resp 16 | Ht 65.0 in | Wt 215.0 lb

## 2018-05-07 DIAGNOSIS — Z09 Encounter for follow-up examination after completed treatment for conditions other than malignant neoplasm: Secondary | ICD-10-CM

## 2018-05-07 DIAGNOSIS — R7989 Other specified abnormal findings of blood chemistry: Secondary | ICD-10-CM

## 2018-05-07 DIAGNOSIS — M791 Myalgia, unspecified site: Secondary | ICD-10-CM

## 2018-05-07 DIAGNOSIS — J3089 Other allergic rhinitis: Secondary | ICD-10-CM | POA: Diagnosis not present

## 2018-05-07 DIAGNOSIS — R079 Chest pain, unspecified: Secondary | ICD-10-CM

## 2018-05-07 LAB — CBC
HCT: 34.4 % — ABNORMAL LOW (ref 36.0–46.0)
Hemoglobin: 11.5 g/dL — ABNORMAL LOW (ref 12.0–15.0)
MCHC: 33.5 g/dL (ref 30.0–36.0)
MCV: 88.3 fl (ref 78.0–100.0)
Platelets: 333 10*3/uL (ref 150.0–400.0)
RBC: 3.9 Mil/uL (ref 3.87–5.11)
RDW: 16 % — AB (ref 11.5–15.5)
WBC: 8.6 10*3/uL (ref 4.0–10.5)

## 2018-05-07 LAB — BASIC METABOLIC PANEL
BUN: 12 mg/dL (ref 6–23)
CO2: 34 meq/L — AB (ref 19–32)
Calcium: 9.4 mg/dL (ref 8.4–10.5)
Chloride: 101 mEq/L (ref 96–112)
Creatinine, Ser: 0.88 mg/dL (ref 0.40–1.20)
GFR: 76.3 mL/min (ref >60.00)
Glucose, Bld: 107 mg/dL — ABNORMAL HIGH (ref 70–99)
Potassium: 4 mEq/L (ref 3.5–5.1)
Sodium: 141 mEq/L (ref 135–145)

## 2018-05-07 LAB — CK: Total CK: 180 U/L — ABNORMAL HIGH (ref 7–177)

## 2018-05-07 LAB — TSH: TSH: 2.93 u[IU]/mL (ref 0.35–4.50)

## 2018-05-07 NOTE — Telephone Encounter (Signed)
Pt coming in for her appt today.

## 2018-05-07 NOTE — Telephone Encounter (Signed)
FYI

## 2018-05-07 NOTE — Patient Instructions (Signed)
I will be in touch with your labs and will get you back in with [facility] cardiology Let me know if any change or worsening of your symptoms, or if your acid reflux does not improve with tums  Take care

## 2018-05-11 ENCOUNTER — Other Ambulatory Visit: Payer: Self-pay | Admitting: Family Medicine

## 2018-05-11 MED ORDER — NORTRIPTYLINE HCL 25 MG PO CAPS: 25.0000 mg | ORAL_CAPSULE | Freq: Every day | ORAL | 1 refills | Status: DC

## 2018-05-13 ENCOUNTER — Telehealth: Payer: Self-pay

## 2018-05-13 ENCOUNTER — Other Ambulatory Visit: Payer: Self-pay

## 2018-05-13 ENCOUNTER — Ambulatory Visit (INDEPENDENT_AMBULATORY_CARE_PROVIDER_SITE_OTHER): Payer: Medicare Other | Admitting: Family Medicine

## 2018-05-13 DIAGNOSIS — M79605 Pain in left leg: Secondary | ICD-10-CM

## 2018-05-13 NOTE — Progress Notes (Signed)
[place] at [facility] [address], [place], [place] [address] [phone]  Date:  05/13/2018   Name:  Stephanie Parks   DOB:  1945-08-15   MRN:  [id number]  PCP:  [name], MD    Chief Complaint: No chief complaint on file.   History of Present Illness:  Stephanie Parks is a 73 y.o. very pleasant female patient who presents with the following: Phone visit today- we had planned for webex but this did not work yet again.   Confirmed her name and DOB Seen here just last week for hospital follow-up ; she was kept overnight with CP, had a rule out with enzymes but did not get a stress test due to equipment issue  We have referred her to cardiology for further eval She also noted aching in her legs at our last visit- bilateral I got a CK which was slightly elevated at 180  At this point just her left leg is bothering her  She notes that she has bad pain in her left leg only "every day all day" for the last 9 days or so. She did not mention this at our last visit.  She notes that the whole leg is painful She does not have calf pain or swelling The leg is not numb.   The foot is not numb or cool No back pain noted   No cough or fever No travel No COVID 19 exposure I am really not able to address this issue over the phone.  Will have to make an exception and bring her in the office - she is able to come in tomorrow am at 8:20  She had normal ABI a year ago so I doubt any acute severe vascular disease  Final Interpretation: Right: Resting right ankle-brachial index is within normal range. No evidence of significant right lower extremity arterial disease. The right toe-brachial index is normal. Left: Resting left ankle-brachial index is within normal range. No evidence of significant left lower extremity arterial disease. The left toe-brachial index is normal.   She did see me with c/o leg pain in November of 2018, both  legs  She has known spine disease, but NSG had not though this was related I had her try gabapentin at that time   MRI from 2 years ago:  IMPRESSION: 1. Fifth first of all please note that I am assuming that the L5 vertebra is sacralized based on numbering of ribs and lumbar vertebra. 2. Lumbar spondylosis and degenerative disc disease causing moderate impingement at T12-L1, L3-4, and L4-5 ; and mild impingement at L1- 2, as detailed above. The impingement at L3-4 and L4-5 is mildly worsened compared to prior exam. Patient Active Problem List   Diagnosis Date Noted  . Bronchitis 04/23/2018  . Tendonitis, Achilles, right 12/08/2017  . Onychomycosis 12/08/2017  . Unilateral primary osteoarthritis, left knee 10/07/2017  . Ingrown toenail 08/10/2017  . Coughing 04/24/2017  . 3-vessel CAD 03/01/2015  . Aneurysm ([place]) 03/01/2015  . Dizziness 03/01/2015  . Hypercholesterolemia 03/01/2015  . Decreased potassium in the blood 03/01/2015  . Paresthesia of arm 03/01/2015  . Allergic rhinitis 03/01/2015  . Achalasia 09/28/2012  . CN (constipation) 09/28/2012  . Cephalalgia 08/24/2012  . HTN (hypertension) 04/19/2011  . Hyperlipidemia 04/19/2011  . GERD (gastroesophageal reflux disease) 04/19/2011  . Chest pain 04/04/2011    Past Medical History:  Diagnosis Date  . Diverticulosis   .  GERD (gastroesophageal reflux disease)   . Hiatal hernia   . Hyperlipidemia   . Hypertension   . Inguinal hernia   . Migraine headache   . Schatzki's ring     Past Surgical History:  Procedure Laterality Date  . ABDOMINAL HYSTERECTOMY    . BACK SURGERY     x 3  . CARPAL TUNNEL RELEASE     left wrist  . CERVICAL SPINE SURGERY    . HEMORRHOID SURGERY      Social History   Tobacco Use  . Smoking status: Former Smoker    Packs/day: 0.50    Years: 20.00    Pack years: 10.00    Types: Cigarettes    Last attempt to quit: 02/19/1984    Years since quitting: 34.2  . Smokeless tobacco: Never  Used  Substance Use Topics  . Alcohol use: No  . Drug use: No    Family History  Problem Relation Age of Onset  . Allergic rhinitis Sister   . Diabetes Sister   . Hypertension Sister   . Heart attack Father 62  . Heart disease Father   . Stomach cancer Maternal Grandmother   . Heart disease Mother   . Colon cancer Neg Hx   . Angioedema Neg Hx   . Asthma Neg Hx   . Eczema Neg Hx   . Urticaria Neg Hx   . Immunodeficiency Neg Hx   . Breast cancer Neg Hx     Allergies  Allergen Reactions  . Shellfish Allergy Hives and Swelling  . Ace Inhibitors Other (See Comments)  . Pitavastatin   . Sulfa Antibiotics Other (See Comments) and Rash    Fine bumps    Medication list has been reviewed and updated.  Current Outpatient Medications on File Prior to Visit  Medication Sig Dispense Refill  . alendronate (FOSAMAX) 10 MG tablet TAKE 1 TABLET BY MOUTH DAILY BEFORE BREAKFAST. TAKE WITH A FULL GLASS OF WATER ON EMPTY STOMACH 90 tablet 1  . aspirin 81 MG tablet Take 81 mg by mouth daily.    Marland Kitchen azelastine (ASTELIN) 0.1 % nasal spray Place 2 sprays into both nostrils 2 (two) times daily as needed for rhinitis. Use in each nostril as directed 30 mL 5  . calcium carbonate (OS-CAL) 600 MG tablet Take 1 tablet (600 mg total) by mouth 2 (two) times daily with a meal. 60 tablet 0  . dexlansoprazole (DEXILANT) 60 MG capsule Take 1 capsule (60 mg total) by mouth daily. 90 capsule 1  . fluticasone (FLONASE) 50 MCG/ACT nasal spray USE 2 SPRAYS IN NOSTRIL(S) DAILY AS NEEDED. 16 g 9  . KLOR-CON M20 20 MEQ tablet TAKE 1 TABLET BY MOUTH EVERY DAY 90 tablet 1  . Lactulose 20 GM/30ML SOLN Take 30 ml (2 tablespoons) by mouth 1-3 times daily for constipation 2700 mL 2  . losartan-hydrochlorothiazide (HYZAAR) 50-12.5 MG tablet TAKE 1 TABLET BY MOUTH EVERY DAY 90 tablet 1  . lovastatin (MEVACOR) 20 MG tablet Take 1 tablet (20 mg total) by mouth daily. 90 tablet 1  . methocarbamol (ROBAXIN) 500 MG tablet Take 1  tablet (500 mg total) by mouth every 8 (eight) hours as needed for muscle spasms. 30 tablet 0  . Multiple Vitamins-Minerals (MULTIVITAL) tablet Take by mouth.    [name] [facility]  Onychomycosis Nail Lacquer -  Fluconazole 2%, Terbinafine 1% DMSO Apply to affected nail once daily Qty. 120 gm 3 refills    . NONFORMULARY OR COMPOUNDED ITEM  Allergen immunotherapy 2 each 1  . nortriptyline (PAMELOR) 25 MG capsule Take 1 capsule (25 mg total) by mouth at bedtime. 90 capsule 1  . ondansetron (ZOFRAN ODT) 4 MG disintegrating tablet Take 1 tablet (4 mg total) by mouth every 8 (eight) hours as needed for nausea or vomiting. 20 tablet 0  . pantoprazole (PROTONIX) 40 MG tablet Take 1 tablet (40 mg total) by mouth daily. Take 30-60 minutes before breakfast. 90 tablet 2   No current facility-administered medications on file prior to visit.     Review of Systems:  As per HPI- otherwise negative.   Physical Examination: There were no vitals filed for this visit. There were no vitals filed for this visit. There is no height or weight on file to calculate BMI. Ideal Body Weight:    Pt sounds well on the phone   Assessment and Plan: Left leg pain leg pain likely due to nerve impingement Will bring pt in the office first thing tomorrow for evaluation as I am not able to manage this over the phone, physical exam is really required.     Signed Lamar Blinks, MD

## 2018-05-13 NOTE — Telephone Encounter (Signed)
-----   Message from Burnell Blanks, MD sent at 05/13/2018 10:34 AM EDT ----- Ms. Mcnamara was seen recently in an outside ED with chest pain. She was seen in primary care 05/07/18 and symptoms had resolved. Dr. Edilia Bo has asked if the patient can be seen for cardiac follow up. Can we check with her and see how she is feeling? If she is having any recurrent chest pain, we can see her as a telemedicine visit tomorrow. If her symptoms are better, we can place on her in the pool for follow up after Covid19 precautions are lifted. Thanks, chris

## 2018-05-13 NOTE — Telephone Encounter (Signed)
Called pt to follow up on how she is feeling.  She reports feeling better with no acute cardiac symptoms at this time. Pt is not interested in televisit at this time and would like to wait for an appt in the office.  I advised pt to call back with any other cardiac symptoms. Pt verbalized understanding. Will route to cancel pool

## 2018-05-14 ENCOUNTER — Encounter: Payer: Self-pay | Admitting: Family Medicine

## 2018-05-14 ENCOUNTER — Other Ambulatory Visit: Payer: Self-pay

## 2018-05-14 ENCOUNTER — Ambulatory Visit (INDEPENDENT_AMBULATORY_CARE_PROVIDER_SITE_OTHER): Payer: Medicare Other | Admitting: Family Medicine

## 2018-05-14 VITALS — BP 122/66 | HR 80 | Temp 98.2°F | Resp 12 | Ht 65.0 in | Wt 215.0 lb

## 2018-05-14 DIAGNOSIS — M5432 Sciatica, left side: Secondary | ICD-10-CM

## 2018-05-14 MED ORDER — ACETAMINOPHEN-CODEINE #3 300-30 MG PO TABS: 1.0000 | ORAL_TABLET | Freq: Three times a day (TID) | ORAL | 0 refills | Status: DC | PRN

## 2018-05-14 MED ORDER — PREDNISONE 20 MG PO TABS: ORAL_TABLET | ORAL | 0 refills | Status: DC

## 2018-05-14 NOTE — Progress Notes (Signed)
[place] at [facility] [address] [place], [place] [address] [phone]  Date:  05/14/2018   Name:  Stephanie Parks   DOB:  [birth year]   MRN:  [id number]  PCP:  [name], MD    Chief Complaint: Leg Pain (left)   History of Present Illness:  Stephanie Parks is a 73 y.o. very pleasant female patient who presents with the following:  Seen here on 3/19 after a brief hospital/ ER observation for chest pain.  At our follow-up she did mention some thigh pain bilaterally that was getting better.  We did a phone visit yesterday at which time she had concern of more severe left leg pain which has now been present for about 10 days She describes a feeling of aching and numbness down the lateral left leg  It goes to the ankle but not to the foot It is not really on the right side-  No back pain No change in her her bowel or bladder function, no saddle anesthesia  Looking back she also had concern of leg pain back in 12/18, at which time we did ABI testing which was normal We also did an MRI in January of 2019 for back and right leg pain- MRI did show lumbar spine disease  IMPRESSION: 1. Fifth first of all please note that I am assuming that the L5 vertebra is sacralized based on numbering of ribs and lumbar vertebra. 2. Lumbar spondylosis and degenerative disc disease causing moderate impingement at T12-L1, L3-4, and L4-5 ; and mild impingement at L1- 2, as detailed above. The impingement at L3-4 and L4-5 is mildly worsened compared to prior exam.  I referred her to neurology for leg pain in January of 2019, he saw Dr, Jannifer Franklin in May of 2019- he planned to do NCT and also start on Gabapentin She has seen NSG, Dr. Christella Noa as well, over the last several years  She most recently had a spine operation in 1998  She has done some online research and thinks that she has sciatica   Patient Active Problem List   Diagnosis Date  Noted  . Bronchitis 04/23/2018  . Tendonitis, Achilles, right 12/08/2017  . Onychomycosis 12/08/2017  . Unilateral primary osteoarthritis, left knee 10/07/2017  . Ingrown toenail 08/10/2017  . Coughing 04/24/2017  . 3-vessel CAD 03/01/2015  . Aneurysm ([place]) 03/01/2015  . Dizziness 03/01/2015  . Hypercholesterolemia 03/01/2015  . Decreased potassium in the blood 03/01/2015  . Paresthesia of arm 03/01/2015  . Allergic rhinitis 03/01/2015  . Achalasia 09/28/2012  . CN (constipation) 09/28/2012  . Cephalalgia 08/24/2012  . HTN (hypertension) 04/19/2011  . Hyperlipidemia 04/19/2011  . GERD (gastroesophageal reflux disease) 04/19/2011  . Chest pain 04/04/2011    Past Medical History:  Diagnosis Date  . Diverticulosis   . GERD (gastroesophageal reflux disease)   . Hiatal hernia   . Hyperlipidemia   . Hypertension   . Inguinal hernia   . Migraine headache   . Schatzki's ring     Past Surgical History:  Procedure Laterality Date  . ABDOMINAL HYSTERECTOMY    . BACK SURGERY     x 3  . CARPAL TUNNEL RELEASE     left wrist  . CERVICAL SPINE SURGERY    . HEMORRHOID SURGERY      Social History   Tobacco Use  . Smoking status: Former Smoker    Packs/day: 0.50    Years: 20.00  Pack years: 10.00    Types: Cigarettes    Last attempt to quit: 02/19/1984    Years since quitting: 34.2  . Smokeless tobacco: Never Used  Substance Use Topics  . Alcohol use: No  . Drug use: No    Family History  Problem Relation Age of Onset  . Allergic rhinitis Sister   . Diabetes Sister   . Hypertension Sister   . Heart attack Father 62  . Heart disease Father   . Stomach cancer Maternal Grandmother   . Heart disease Mother   . Colon cancer Neg Hx   . Angioedema Neg Hx   . Asthma Neg Hx   . Eczema Neg Hx   . Urticaria Neg Hx   . Immunodeficiency Neg Hx   . Breast cancer Neg Hx     Allergies  Allergen Reactions  . Shellfish Allergy Hives and Swelling  . Ace Inhibitors Other  (See Comments)  . Pitavastatin   . Sulfa Antibiotics Other (See Comments) and Rash    Fine bumps    Medication list has been reviewed and updated.  Current Outpatient Medications on File Prior to Visit  Medication Sig Dispense Refill  . alendronate (FOSAMAX) 10 MG tablet TAKE 1 TABLET BY MOUTH DAILY BEFORE BREAKFAST. TAKE WITH A FULL GLASS OF WATER ON EMPTY STOMACH 90 tablet 1  . aspirin 81 MG tablet Take 81 mg by mouth daily.    Marland Kitchen azelastine (ASTELIN) 0.1 % nasal spray Place 2 sprays into both nostrils 2 (two) times daily as needed for rhinitis. Use in each nostril as directed 30 mL 5  . calcium carbonate (OS-CAL) 600 MG tablet Take 1 tablet (600 mg total) by mouth 2 (two) times daily with a meal. 60 tablet 0  . dexlansoprazole (DEXILANT) 60 MG capsule Take 1 capsule (60 mg total) by mouth daily. 90 capsule 1  . fluticasone (FLONASE) 50 MCG/ACT nasal spray USE 2 SPRAYS IN NOSTRIL(S) DAILY AS NEEDED. 16 g 9  . KLOR-CON M20 20 MEQ tablet TAKE 1 TABLET BY MOUTH EVERY DAY 90 tablet 1  . Lactulose 20 GM/30ML SOLN Take 30 ml (2 tablespoons) by mouth 1-3 times daily for constipation 2700 mL 2  . losartan-hydrochlorothiazide (HYZAAR) 50-12.5 MG tablet TAKE 1 TABLET BY MOUTH EVERY DAY 90 tablet 1  . lovastatin (MEVACOR) 20 MG tablet Take 1 tablet (20 mg total) by mouth daily. 90 tablet 1  . methocarbamol (ROBAXIN) 500 MG tablet Take 1 tablet (500 mg total) by mouth every 8 (eight) hours as needed for muscle spasms. 30 tablet 0  . Multiple Vitamins-Minerals (MULTIVITAL) tablet Take by mouth.    [name] [facility]  Onychomycosis Nail Lacquer -  Fluconazole 2%, Terbinafine 1% DMSO Apply to affected nail once daily Qty. 120 gm 3 refills    . NONFORMULARY OR COMPOUNDED ITEM Allergen immunotherapy 2 each 1  . nortriptyline (PAMELOR) 25 MG capsule Take 1 capsule (25 mg total) by mouth at bedtime. 90 capsule 1  . ondansetron (ZOFRAN ODT) 4 MG disintegrating tablet Take 1 tablet (4 mg  total) by mouth every 8 (eight) hours as needed for nausea or vomiting. 20 tablet 0  . pantoprazole (PROTONIX) 40 MG tablet Take 1 tablet (40 mg total) by mouth daily. Take 30-60 minutes before breakfast. 90 tablet 2   No current facility-administered medications on file prior to visit.     Review of Systems:  As per HPI- otherwise negative.    Physical Examination: Vitals:  05/14/18 0824  BP: 122/66  Pulse: 80  Resp: 12  Temp: 98.2 F (36.8 C)  SpO2: 98%   Vitals:   05/14/18 0824  Weight: 215 lb (97.5 kg)  Height: 5\' 5"  (1.651 m)   Body mass index is 35.78 kg/m. Ideal Body Weight: Weight in (lb) to have BMI = 25: 149.9  GEN: WDWN, NAD, Non-toxic, A & O x 3, obese, looks well  HEENT: Atraumatic, Normocephalic. Neck supple. No masses, No LAD. Ears and Nose: No external deformity. CV: RRR, No M/G/R. No JVD. No thrill. No extra heart sounds. PULM: CTA B, no wheezes, crackles, rhonchi. No retractions. No resp. distress. No accessory muscle use. ABD: S, NT, ND. No rebound. No HSM. Midline scar on lumbar back from previous operations Normal TL flexion and extension for age.  Able to raise up on toes  EXTR: No c/c/e NEURO Normal gait.  PSYCH: Normally interactive. Conversant. Not depressed or anxious appearing.  Calm demeanor.  Bilateral feet are warm and well perfused with strong DP pulses.  Not able to palpate femoral pulse today as pt dislikes pressure and is obese  Monofilament sensed on both feet, but she feels it is less strong on the left SLR positive on the left only Quad and hamstring strength slightly reduced on left only Normal sensation of skin on both legs  No saddle anesthesia    Assessment and Plan: Sciatica of left side - Plan: predniSONE (DELTASONE) 20 MG tablet, acetaminophen-codeine (TYLENOL #3) 300-30 MG tablet  Here today with likely sciatica pain Treat with prednisone as below Also gave her tylenol #3 to use as needed for pain but cautioned her  that this will cause sedation She will start with 1 pill, only go to 2 if pain persists She will alert me if her pain does not resolve, in that case will plan to have her see NSG when possible  She has been on nortriptyline for years, but cannot recall why this was started or why she is taking it.  She would like to stop using this- Will stop nortriptyline as she is just on 25 mg  Signed Lamar Blinks, MD

## 2018-05-14 NOTE — Patient Instructions (Addendum)
It was good to see you today.  I think that your left leg pain is caused by sciatica, or irritation of the sciatic nerve.  Oftentimes a steroid like oral prednisone will help with this.  I sent a prescription for prednisone to your pharmacy.  Please use as directed.  While you are on prednisone, please avoid taking any anti-inflammatory medications like ibuprofen or Aleve.  Tylenol or your muscle relaxer are okay  I also gave you some tylenol with codeine that you can use if your pain is more severe.  However this will make you sleepy- do not use if you need to drive, don't combine with muscle relaxer    Please let me know if you are not feeling better over the next few days- Sooner if worse.

## 2018-05-19 ENCOUNTER — Other Ambulatory Visit: Payer: Self-pay

## 2018-05-19 ENCOUNTER — Ambulatory Visit (INDEPENDENT_AMBULATORY_CARE_PROVIDER_SITE_OTHER): Payer: Medicare Other

## 2018-05-19 DIAGNOSIS — J309 Allergic rhinitis, unspecified: Secondary | ICD-10-CM

## 2018-05-22 ENCOUNTER — Emergency Department (HOSPITAL_BASED_OUTPATIENT_CLINIC_OR_DEPARTMENT_OTHER)
Admission: EM | Admit: 2018-05-22 | Discharge: 2018-05-22 | Disposition: A | Discharge status: Home / Self Care | Payer: Medicare Other | Attending: Emergency Medicine | Admitting: Emergency Medicine

## 2018-05-22 ENCOUNTER — Encounter (HOSPITAL_BASED_OUTPATIENT_CLINIC_OR_DEPARTMENT_OTHER): Payer: Self-pay | Admitting: *Deleted

## 2018-05-22 ENCOUNTER — Other Ambulatory Visit: Payer: Self-pay

## 2018-05-22 DIAGNOSIS — Z7982 Long term (current) use of aspirin: Secondary | ICD-10-CM | POA: Insufficient documentation

## 2018-05-22 DIAGNOSIS — Z87891 Personal history of nicotine dependence: Secondary | ICD-10-CM | POA: Insufficient documentation

## 2018-05-22 DIAGNOSIS — R079 Chest pain, unspecified: Secondary | ICD-10-CM | POA: Diagnosis not present

## 2018-05-22 DIAGNOSIS — R002 Palpitations: Secondary | ICD-10-CM

## 2018-05-22 DIAGNOSIS — Z79899 Other long term (current) drug therapy: Secondary | ICD-10-CM | POA: Insufficient documentation

## 2018-05-22 DIAGNOSIS — I10 Essential (primary) hypertension: Secondary | ICD-10-CM | POA: Diagnosis not present

## 2018-05-22 DIAGNOSIS — R0789 Other chest pain: Secondary | ICD-10-CM | POA: Insufficient documentation

## 2018-05-22 LAB — CBC WITH DIFFERENTIAL/PLATELET
Abs Immature Granulocytes: 0.05 10*3/uL (ref 0.00–0.07)
Basophils Absolute: 0 10*3/uL (ref 0.0–0.1)
Basophils Relative: 0 %
Eosinophils Absolute: 0 10*3/uL (ref 0.0–0.5)
Eosinophils Relative: 0 %
HCT: 33.6 % — ABNORMAL LOW (ref 36.0–46.0)
Hemoglobin: 10.5 g/dL — ABNORMAL LOW (ref 12.0–15.0)
Immature Granulocytes: 1 %
Lymphocytes Relative: 27 %
Lymphs Abs: 3 10*3/uL (ref 0.7–4.0)
MCH: 28.5 pg (ref 26.0–34.0)
MCHC: 31.3 g/dL (ref 30.0–36.0)
MCV: 91.3 fL (ref 80.0–100.0)
Monocytes Absolute: 0.6 10*3/uL (ref 0.1–1.0)
Monocytes Relative: 5 %
Neutro Abs: 7.5 10*3/uL (ref 1.7–7.7)
Neutrophils Relative %: 67 %
Platelets: 338 10*3/uL (ref 150–400)
RBC: 3.68 MIL/uL — ABNORMAL LOW (ref 3.87–5.11)
RDW: 16.1 % — ABNORMAL HIGH (ref 11.5–15.5)
WBC: 11.1 10*3/uL — ABNORMAL HIGH (ref 4.0–10.5)
nRBC: 0 % (ref 0.0–0.2)

## 2018-05-22 LAB — BASIC METABOLIC PANEL
Anion gap: 9 (ref 5–15)
BUN: 16 mg/dL (ref 8–23)
CO2: 28 mmol/L (ref 22–32)
Calcium: 8.6 mg/dL — ABNORMAL LOW (ref 8.9–10.3)
Chloride: 101 mmol/L (ref 98–111)
Creatinine, Ser: 0.84 mg/dL (ref 0.44–1.00)
GFR calc Af Amer: >60 mL/min (ref >60)
GFR calc non Af Amer: >60 mL/min (ref >60)
Glucose, Bld: 107 mg/dL — ABNORMAL HIGH (ref 70–99)
Potassium: 3.5 mmol/L (ref 3.5–5.1)
Sodium: 138 mmol/L (ref 135–145)

## 2018-05-22 LAB — TROPONIN I: Troponin I: <0.03 ng/mL (ref <0.03)

## 2018-05-22 NOTE — ED Triage Notes (Signed)
Chest pain and SOB x 3 weeks. She was seen for same at her local ED and had a CT and EKG. She was admitted overnight and was scheduled for a stress test that has not been done. Her U/S has been done.

## 2018-05-22 NOTE — ED Provider Notes (Signed)
[facility] EMERGENCY DEPARTMENT Provider Note   CSN: [id number] Arrival date & time: 05/22/18  1851    History   Chief Complaint Chief Complaint  Patient presents with  . Shortness of Breath    HPI Stephanie Parks is a 73 y.o. female.     Patient is a 73 year old female with a history of hypertension and hiatal hernia and hyperlipidemia who presents with chest pain.  She states for about 3 weeks she has been having some sharp pinpoint-like chest pain in the left side that she is also having episodes where she feels like her heart is racing.  It resolved spontaneously.  Her symptoms are nonexertional.  At times they wake her up at night from sleep.  She also has some heartburn-like symptoms.  She was recently admitted in mid March to [facility] for the same symptoms.  She was admitted overnight and had serial troponins which were negative.  She was admitted for a stress test but the stress test machine apparently was not working and she had an echocardiogram instead.  The echo showed some mild LVH but no wall motion abnormalities.  She was seen by cardiology and advised to follow-up as an outpatient for a stress test and Holter monitoring.  She had a follow-up appointment with her PCP who was referring her back to her cardiologist, Dr. Angelena Form.  She states she has not been able to get an appointment with a cardiologist for follow-up yet.  She is concerned because she still having episodes where her heart is racing.  She currently denies any symptoms.  She denies any significant shortness of breath.  No dizziness.  No diaphoresis.  No nausea or vomiting.     Past Medical History:  Diagnosis Date  . Diverticulosis   . GERD (gastroesophageal reflux disease)   . Hiatal hernia   . Hyperlipidemia   . Hypertension   . Inguinal hernia   . Migraine headache   . Schatzki's ring     Patient Active Problem List   Diagnosis Date Noted  . Bronchitis 04/23/2018  .  Tendonitis, Achilles, right 12/08/2017  . Onychomycosis 12/08/2017  . Unilateral primary osteoarthritis, left knee 10/07/2017  . Ingrown toenail 08/10/2017  . Coughing 04/24/2017  . 3-vessel CAD 03/01/2015  . Aneurysm ([place]) 03/01/2015  . Dizziness 03/01/2015  . Hypercholesterolemia 03/01/2015  . Decreased potassium in the blood 03/01/2015  . Paresthesia of arm 03/01/2015  . Allergic rhinitis 03/01/2015  . Achalasia 09/28/2012  . CN (constipation) 09/28/2012  . Cephalalgia 08/24/2012  . HTN (hypertension) 04/19/2011  . Hyperlipidemia 04/19/2011  . GERD (gastroesophageal reflux disease) 04/19/2011  . Chest pain 04/04/2011    Past Surgical History:  Procedure Laterality Date  . ABDOMINAL HYSTERECTOMY    . BACK SURGERY     x 3  . CARPAL TUNNEL RELEASE     left wrist  . CERVICAL SPINE SURGERY    . HEMORRHOID SURGERY       OB History   No obstetric history on file.      Home Medications    Prior to Admission medications   Medication Sig Start Date End Date Taking? Authorizing Provider  acetaminophen-codeine (TYLENOL #3) 300-30 MG tablet Take 1-2 tablets by mouth every 8 (eight) hours as needed for moderate pain. 05/14/18   Copland, Gay Filler, MD  alendronate (FOSAMAX) 10 MG tablet TAKE 1 TABLET BY MOUTH DAILY BEFORE BREAKFAST. TAKE WITH A FULL GLASS OF WATER ON EMPTY STOMACH 03/17/18   Copland,  Gay Filler, MD  aspirin 81 MG tablet Take 81 mg by mouth daily.    [provider]  azelastine (ASTELIN) 0.1 % nasal spray Place 2 sprays into both nostrils 2 (two) times daily as needed for rhinitis. Use in each nostril as directed 04/24/17   Bobbitt, Sedalia Muta, MD  calcium carbonate (OS-CAL) 600 MG tablet Take 1 tablet (600 mg total) by mouth 2 (two) times daily with a meal. 09/20/16   Saguier, Percell Miller, PA-C  dexlansoprazole (DEXILANT) 60 MG capsule Take 1 capsule (60 mg total) by mouth daily. 01/23/18   Pyrtle, Lajuan Lines, MD  fluticasone (FLONASE) 50 MCG/ACT nasal spray USE 2 SPRAYS  IN NOSTRIL(S) DAILY AS NEEDED. 01/14/18   Copland, Gay Filler, MD  KLOR-CON M20 20 MEQ tablet TAKE 1 TABLET BY MOUTH EVERY DAY 02/09/18   Copland, Gay Filler, MD  Lactulose 20 GM/30ML SOLN Take 30 ml (2 tablespoons) by mouth 1-3 times daily for constipation 03/06/18   Pyrtle, Lajuan Lines, MD  losartan-hydrochlorothiazide (HYZAAR) 50-12.5 MG tablet TAKE 1 TABLET BY MOUTH EVERY DAY 03/26/18   Copland, Gay Filler, MD  lovastatin (MEVACOR) 20 MG tablet Take 1 tablet (20 mg total) by mouth daily. 01/22/18   Copland, Gay Filler, MD  methocarbamol (ROBAXIN) 500 MG tablet Take 1 tablet (500 mg total) by mouth every 8 (eight) hours as needed for muscle spasms. 01/28/18   Copland, Gay Filler, MD  Multiple Vitamins-Minerals (MULTIVITAL) tablet Take by mouth.    [provider]  NON FORMULARY [facility]  Onychomycosis Nail Lacquer -  Fluconazole 2%, Terbinafine 1% DMSO Apply to affected nail once daily Qty. 120 gm 3 refills    [provider]  NONFORMULARY OR COMPOUNDED ITEM Allergen immunotherapy 03/01/15   Leda Roys, MD  ondansetron (ZOFRAN ODT) 4 MG disintegrating tablet Take 1 tablet (4 mg total) by mouth every 8 (eight) hours as needed for nausea or vomiting. 03/25/18   Caccavale, Sophia, PA-C  pantoprazole (PROTONIX) 40 MG tablet Take 1 tablet (40 mg total) by mouth daily. Take 30-60 minutes before breakfast. 10/30/17   Levin Erp, PA  predniSONE (DELTASONE) 20 MG tablet Take 2 pills a day for 5 days, then 1 pill a day for 5 days 05/14/18   Copland, Gay Filler, MD    Family History Family History  Problem Relation Age of Onset  . Allergic rhinitis Sister   . Diabetes Sister   . Hypertension Sister   . Heart attack Father 8  . Heart disease Father   . Stomach cancer Maternal Grandmother   . Heart disease Mother   . Colon cancer Neg Hx   . Angioedema Neg Hx   . Asthma Neg Hx   . Eczema Neg Hx   . Urticaria Neg Hx   . Immunodeficiency Neg Hx   . Breast cancer Neg Hx      Social History Social History   Tobacco Use  . Smoking status: Former Smoker    Packs/day: 0.50    Years: 20.00    Pack years: 10.00    Types: Cigarettes    Last attempt to quit: 02/19/1984    Years since quitting: 34.2  . Smokeless tobacco: Never Used  Substance Use Topics  . Alcohol use: No  . Drug use: No     Allergies   Shellfish allergy; Ace inhibitors; Pitavastatin; and Sulfa antibiotics   Review of Systems Review of Systems  Constitutional: Negative for chills, diaphoresis, fatigue and fever.  HENT: Negative for congestion,  rhinorrhea and sneezing.   Eyes: Negative.   Respiratory: Negative for cough, chest tightness and shortness of breath.   Cardiovascular: Positive for chest pain and palpitations. Negative for leg swelling.  Gastrointestinal: Negative for abdominal pain, blood in stool, diarrhea, nausea and vomiting.  Genitourinary: Negative for difficulty urinating, flank pain, frequency and hematuria.  Musculoskeletal: Negative for arthralgias and back pain.  Skin: Negative for rash.  Neurological: Negative for dizziness, speech difficulty, weakness, numbness and headaches.     Physical Exam Updated Vital Signs BP 140/61   Pulse 76   Temp 98.3 F (36.8 C) (Oral)   Resp 19   Ht 5\' 5"  (1.651 m)   Wt 97.5 kg   SpO2 99%   BMI 35.77 kg/m   Physical Exam Constitutional:      Appearance: She is well-developed.  HENT:     Head: Normocephalic and atraumatic.  Eyes:     Pupils: Pupils are equal, round, and reactive to light.  Neck:     Musculoskeletal: Normal range of motion and neck supple.  Cardiovascular:     Rate and Rhythm: Normal rate and regular rhythm.     Heart sounds: Normal heart sounds.  Pulmonary:     Effort: Pulmonary effort is normal. No respiratory distress.     Breath sounds: Normal breath sounds. No wheezing or rales.  Chest:     Chest wall: No tenderness.  Abdominal:     General: Bowel sounds are normal.     Palpations: Abdomen  is soft.     Tenderness: There is no abdominal tenderness. There is no guarding or rebound.  Musculoskeletal: Normal range of motion.     Right lower leg: No edema.     Left lower leg: No edema.  Lymphadenopathy:     Cervical: No cervical adenopathy.  Skin:    General: Skin is warm and dry.     Findings: No rash.  Neurological:     Mental Status: She is alert and oriented to person, place, and time.      ED Treatments / Results  Labs (all labs ordered are listed, but only abnormal results are displayed) Labs Reviewed  BASIC METABOLIC PANEL - Abnormal; Notable for the following components:      Result Value   Glucose, Bld 107 (*)    Calcium 8.6 (*)    All other components within normal limits  CBC WITH DIFFERENTIAL/PLATELET - Abnormal; Notable for the following components:   WBC 11.1 (*)    RBC 3.68 (*)    Hemoglobin 10.5 (*)    HCT 33.6 (*)    RDW 16.1 (*)    All other components within normal limits  TROPONIN I    EKG EKG Interpretation  Date/Time:  Friday May 22 2018 19:08:51 EDT Ventricular Rate:  80 PR Interval:    QRS Duration: 97 QT Interval:  370 QTC Calculation: 427 R Axis:   5 Text Interpretation:  Sinus rhythm since last tracing no significant change Confirmed by Malvin Johns [phone]) on 05/22/2018 7:13:33 PM   Radiology No results found.  Procedures Procedures (including critical care time)  Medications Ordered in ED Medications - No data to display   Initial Impression / Assessment and Plan / ED Course  I have reviewed the triage vital signs and the nursing notes.  Pertinent labs & imaging results that were available during my care of the patient were reviewed by me and considered in my medical decision making (see chart for details).  Patient is a 73 year old female who presents with a 3-week history of some intermittent palpitations and chest pain.  Her chest pain is sharp and intermittent.  Is not exertional.  She has no  associated symptoms.  Her EKG does not show any ischemic changes.  Her troponin is negative.  I did not do a chest x-ray she has had a recent admission for similar symptoms.  She had a recent echocardiogram which did not show any significant wall motion abnormalities.  She was scheduled to follow-up with Dr. Angelena Form with cardiology to get a Holter monitoring and stress test.  However given the pandemic, they had tried to schedule her with a telemetry visit which she was not interested in.  I did reach out to the cardiology fellow who will send a MyChart message to Dr. Angelena Form to try to arrange follow-up for this patient.  Return precautions were given.  Final Clinical Impressions(s) / ED Diagnoses   Final diagnoses:  Palpitations  Chest pain, unspecified type    ED Discharge Orders    None       Malvin Johns, MD 05/22/18 2105

## 2018-05-24 ENCOUNTER — Telehealth: Payer: Self-pay | Admitting: Cardiology

## 2018-05-24 NOTE — Telephone Encounter (Signed)
Was asked by Fellow for pt to be seen this week due to 2 visits to ER with chest pain.  I have sent to DOD for Monday and to schedulers.  Left message for pt.

## 2018-05-27 ENCOUNTER — Ambulatory Visit: Payer: Medicare Other | Admitting: Interventional Cardiology

## 2018-05-27 ENCOUNTER — Telehealth: Payer: Self-pay

## 2018-05-27 NOTE — Telephone Encounter (Signed)
Virtual Visit Pre-Appointment Phone Call  TELEPHONE CALL NOTE  SADI ARAVE has been deemed a candidate for a follow-up tele-health visit to limit community exposure during the Covid-19 pandemic. I spoke with the patient via phone to ensure availability of phone/video source, confirm preferred email & phone number, and discuss instructions and expectations.  I reminded [name] to be prepared with any vital sign and/or heart rhythm information that could potentially be obtained via home monitoring, at the time of her visit. I reminded [name] to expect a phone call at the time of her visit if her visit.  Did the patient verbally acknowledge consent to treatment? YES  Patient scheduled for VIDEO Visit via [facility] on 4/9 with Dr. Irish Lack  Cleon Gustin, RN 05/27/2018 9:55 AM   DOWNLOADING [place], go to [employer] and type in WebEx in the search bar. [place] [employer], the blue/green circle. The app is free but as with any other app downloads, their phone may require them to verify saved payment information or Apple password. The patient does NOT have to create an account.  - If Android, ask patient to go to [employer] and type in WebEx in the search bar. [place] [employer], the blue/green circle. The app is free but as with any other app downloads, their phone may require them to verify saved payment information or Android password. The patient does NOT have to create an account.   CONSENT FOR TELE-HEALTH VISIT - PLEASE REVIEW  I hereby voluntarily request, consent and authorize [facility] and its employed or contracted physicians, physician assistants, nurse practitioners or other licensed health care professionals (the Practitioner), to provide me with telemedicine health care services (the "Services") as deemed necessary by the treating Practitioner. I acknowledge and consent to receive the  Services by the Practitioner via telemedicine. I understand that the telemedicine visit will involve communicating with the Practitioner through live audiovisual communication technology and the disclosure of certain medical information by electronic transmission. I acknowledge that I have been given the opportunity to request an in-person assessment or other available alternative prior to the telemedicine visit and am voluntarily participating in the telemedicine visit.  I understand that I have the right to withhold or withdraw my consent to the use of telemedicine in the course of my care at any time, without affecting my right to future care or treatment, and that the Practitioner or I may terminate the telemedicine visit at any time. I understand that I have the right to inspect all information obtained and/or recorded in the course of the telemedicine visit and may receive copies of available information for a reasonable fee.  I understand that some of the potential risks of receiving the Services via telemedicine include:  Marland Kitchen Delay or interruption in medical evaluation due to technological equipment failure or disruption; . Information transmitted may not be sufficient (e.g. poor resolution of images) to allow for appropriate medical decision making by the Practitioner; and/or  . In rare instances, security protocols could fail, causing a breach of personal health information.  Furthermore, I acknowledge that it is my responsibility to provide information about my medical history, conditions and care that is complete and accurate to the best of my ability. I acknowledge that Practitioner's advice, recommendations, and/or decision may be based on factors not within their control, such as incomplete or inaccurate data provided by me or distortions of  diagnostic images or specimens that may result from electronic transmissions. I understand that the practice of medicine is not an exact science and that  Practitioner makes no warranties or guarantees regarding treatment outcomes. I acknowledge that I will receive a copy of this consent concurrently upon execution via email to the email address I last provided but may also request a printed copy by calling the office of [place].    I understand that my insurance will be billed for this visit.   I have read or had this consent read to me. . I understand the contents of this consent, which adequately explains the benefits and risks of the Services being provided via telemedicine.  . I have been provided ample opportunity to ask questions regarding this consent and the Services and have had my questions answered to my satisfaction. . I give my informed consent for the services to be provided through the use of telemedicine in my medical care  By participating in this telemedicine visit I agree to the above.

## 2018-05-28 ENCOUNTER — Other Ambulatory Visit: Payer: Self-pay

## 2018-05-28 ENCOUNTER — Encounter: Payer: Self-pay | Admitting: Interventional Cardiology

## 2018-05-28 ENCOUNTER — Telehealth (INDEPENDENT_AMBULATORY_CARE_PROVIDER_SITE_OTHER): Payer: Medicare Other | Admitting: Interventional Cardiology

## 2018-05-28 VITALS — Ht 65.0 in | Wt 217.0 lb

## 2018-05-28 DIAGNOSIS — R002 Palpitations: Secondary | ICD-10-CM | POA: Diagnosis not present

## 2018-05-28 DIAGNOSIS — E785 Hyperlipidemia, unspecified: Secondary | ICD-10-CM | POA: Diagnosis not present

## 2018-05-28 DIAGNOSIS — R072 Precordial pain: Secondary | ICD-10-CM | POA: Diagnosis not present

## 2018-05-28 DIAGNOSIS — K219 Gastro-esophageal reflux disease without esophagitis: Secondary | ICD-10-CM

## 2018-05-28 MED ORDER — ROSUVASTATIN CALCIUM 10 MG PO TABS: 10.0000 mg | ORAL_TABLET | Freq: Every day | ORAL | 3 refills | Status: DC

## 2018-05-28 NOTE — Progress Notes (Signed)
Virtual Visit via Video Note   This visit type was conducted due to national recommendations for restrictions regarding the COVID-19 Pandemic (e.g. social distancing) in an effort to limit this patient's exposure and mitigate transmission in our community.  Due to her co-morbid illnesses, this patient is at least at moderate risk for complications without adequate follow up.  This format is felt to be most appropriate for this patient at this time.  All issues noted in this document were discussed and addressed.  A limited physical exam was performed with this format.  Please refer to the patient's chart for her consent to telehealth for [facility].   Evaluation Performed:  Follow-up visit  Date:  05/28/2018   ID:  Stephanie Parks, Stephanie Parks [birth year], MRN [id number]  Patient Location: Home  Provider Location: Home  PCP:  Copland, Gay Filler, MD  Cardiologist:  No primary care provider on file. [place] Electrophysiologist:  None   Chief Complaint:  Chest pain  History of Present Illness:    Stephanie Parks is a 73 y.o. female who presents via audio/video conferencing for a telehealth visit today.    She has had HTN and hyperlipidemia.  She has a h/o brain aneurysm.  She had a cath many years ago and no PCI was needed.    She has had palpitations, monitor in 2018 showed:  Normal sinus rhythm with occasional PACs and PVCs.  Occasional sinus tachycardia noted.  Patient's symptoms of fluttering did not correspond to an arrhythmia.  She has had some chest discomfort.  It is relieved with walking and belching.  She went to the [facility] ER.  She ruled out for MI and a stress test was recommended.  She did not have this.  THe pain is intermittent and resolves on its own.  Occasional shortness of breath.  Palpitations are better.  Overall, she feels better from when she went to the emergency room.  She walks and does some "senior exercises."  No chest pain with this.    Denies : Dizziness. Leg edema. Nitroglycerin use. Orthopnea. Palpitations. Paroxysmal nocturnal dyspnea.  Syncope.   The patient does not have symptoms concerning for COVID-19 infection (fever, chills, cough, or new shortness of breath).    Past Medical History:  Diagnosis Date  . 3-vessel CAD 03/01/2015  . Achalasia 09/28/2012  . Allergic rhinitis 03/01/2015  . Aneurysm ([place]) 03/01/2015  . BP (high blood pressure) 03/01/2015  . Bronchitis 04/23/2018  . Cephalalgia 08/24/2012   Overview:  ICD-10 cut over    . Chest pain 04/04/2011  . CN (constipation) 09/28/2012  . Coughing 04/24/2017  . Decreased potassium in the blood 03/01/2015  . Diverticulosis   . Dizziness 03/01/2015  . GERD (gastroesophageal reflux disease)   . Hiatal hernia   . Hypercholesterolemia 03/01/2015  . Hyperlipidemia   . Hypertension   . Ingrown toenail 08/10/2017  . Inguinal hernia   . Migraine headache   . Onychomycosis 12/08/2017  . Paresthesia of arm 03/01/2015  . Schatzki's ring   . Tendonitis, Achilles, right 12/08/2017  . Unilateral primary osteoarthritis, left knee 10/07/2017   Past Surgical History:  Procedure Laterality Date  . ABDOMINAL HYSTERECTOMY    . BACK SURGERY     x 3  . CARPAL TUNNEL RELEASE     left wrist  . CERVICAL SPINE SURGERY    . HEMORRHOID SURGERY       Current Meds  Medication Sig  . acetaminophen-codeine (TYLENOL #3) 300-30 MG  tablet Take 1-2 tablets by mouth every 8 (eight) hours as needed for moderate pain.  Marland Kitchen alendronate (FOSAMAX) 10 MG tablet TAKE 1 TABLET BY MOUTH DAILY BEFORE BREAKFAST. TAKE WITH A FULL GLASS OF WATER ON EMPTY STOMACH  . aspirin 81 MG tablet Take 81 mg by mouth daily.  Marland Kitchen azelastine (ASTELIN) 0.1 % nasal spray Place 2 sprays into both nostrils 2 (two) times daily as needed for rhinitis. Use in each nostril as directed  . calcium carbonate (OS-CAL) 600 MG tablet Take 1 tablet (600 mg total) by mouth 2 (two) times daily with a meal.  . dexlansoprazole (DEXILANT) 60  MG capsule Take 1 capsule (60 mg total) by mouth daily.  . fluticasone (FLONASE) 50 MCG/ACT nasal spray USE 2 SPRAYS IN NOSTRIL(S) DAILY AS NEEDED.  Marland Kitchen KLOR-CON M20 20 MEQ tablet TAKE 1 TABLET BY MOUTH EVERY DAY  . Lactulose 20 GM/30ML SOLN Take 30 ml (2 tablespoons) by mouth 1-3 times daily for constipation  . losartan-hydrochlorothiazide (HYZAAR) 50-12.5 MG tablet TAKE 1 TABLET BY MOUTH EVERY DAY  . lovastatin (MEVACOR) 20 MG tablet Take 1 tablet (20 mg total) by mouth daily.  . methocarbamol (ROBAXIN) 500 MG tablet Take 1 tablet (500 mg total) by mouth every 8 (eight) hours as needed for muscle spasms.  . Multiple Vitamins-Minerals (MULTIVITAL) tablet Take by mouth.  [name] FORMULARY [facility]  Onychomycosis Nail Lacquer -  Fluconazole 2%, Terbinafine 1% DMSO Apply to affected nail once daily Qty. 120 gm 3 refills  . NONFORMULARY OR COMPOUNDED ITEM Allergen immunotherapy  . ondansetron (ZOFRAN ODT) 4 MG disintegrating tablet Take 1 tablet (4 mg total) by mouth every 8 (eight) hours as needed for nausea or vomiting.  . pantoprazole (PROTONIX) 40 MG tablet Take 1 tablet (40 mg total) by mouth daily. Take 30-60 minutes before breakfast.     Allergies:   Shellfish allergy; Ace inhibitors; Pitavastatin; and Sulfa antibiotics   Social History   Tobacco Use  . Smoking status: Former Smoker    Packs/day: 0.50    Years: 20.00    Pack years: 10.00    Types: Cigarettes    Last attempt to quit: 02/19/1984    Years since quitting: 34.2  . Smokeless tobacco: Never Used  Substance Use Topics  . Alcohol use: No  . Drug use: No     Family Hx: The patient's family history includes Allergic rhinitis in her sister; Diabetes in her sister; Heart attack (age of onset: 12) in her father; Heart disease in her father and mother; Hypertension in her sister; Stomach cancer in her maternal grandmother. There is no history of Colon cancer, Angioedema, Asthma, Eczema, Urticaria, Immunodeficiency, or  Breast cancer.  ROS:   Please see the history of present illness.     All other systems reviewed and are negative.   Prior CV studies:   The following studies were reviewed today:  As above  Labs/Other Tests and Data Reviewed:    EKG:    Recent Labs: 03/24/2018: ALT 15; Magnesium 1.9 05/07/2018: TSH 2.93 05/22/2018: BUN 16; Creatinine, Ser 0.84; Hemoglobin 10.5; Platelets 338; Potassium 3.5; Sodium 138   Recent Lipid Panel Lab Results  Component Value Date/Time   CHOL 189 05/01/2016 01:10 PM   TRIG 149.0 05/01/2016 01:10 PM   HDL 60.60 05/01/2016 01:10 PM   CHOLHDL 3 05/01/2016 01:10 PM   LDLCALC 98 05/01/2016 01:10 PM    Wt Readings from Last 3 Encounters:  05/28/18 217 lb (98.4 kg)  05/22/18  214 lb 15.2 oz (97.5 kg)  05/14/18 215 lb (97.5 kg)     Objective:    Vital Signs:  Ht 5\' 5"  (1.651 m)   Wt 217 lb (98.4 kg)   BMI 36.11 kg/m    Well nourished, well developed female in no acute distress. No obvious shortness of breath  ASSESSMENT & PLAN:    1. Chest pain: Atypical.  No exertional component. Stop lovastatin, and start Crestor 10 mg daily.  Check labs in 2 months.   Would not pursue stress test at this time since her symptoms are quite atypical and they are improving.  If symptoms get worse, would plan for nuclear stress test. 2. GERD: Sees Dr. Hilarie Fredrickson.  She feels Protonix is not working.  She has been on this for some time.  She is adding TUMS.   Will route note to him as well to see if he would switch her PPI. 3. Palpitations: sx have decreased.  Premature beats noted in the past.    COVID-19 Education: The signs and symptoms of COVID-19 were discussed with the patient and how to seek care for testing (follow up with PCP or arrange E-visit). *The importance of social distancing was discussed today.  Spoke extensively about this due to her job at [employer].  Time:   Today, I have spent 25 minutes with the patient with telehealth technology discussing the above  problems.     Medication Adjustments/Labs and Tests Ordered: Current medicines are reviewed at length with the patient today.  Concerns regarding medicines are outlined above.  Tests Ordered: No orders of the defined types were placed in this encounter.  Medication Changes: No orders of the defined types were placed in this encounter.   Disposition:  Follow up in 6 week(s)  Signed, Larae Grooms, MD  05/28/2018 9:31 AM    [facility]

## 2018-05-28 NOTE — Patient Instructions (Signed)
Medication Instructions:  Your physician has recommended you make the following change in your medication:   1. STOP: lovastatin  2. START: rosuvastatin (crestor) 10 mg by mouth once daily  Lab work: Your physician recommends that you return for a FASTING lipid profile and liver function panel on 07/28/18   If you have labs (blood work) drawn today and your tests are completely normal, you will receive your results only by: Marland Kitchen MyChart Message (if you have MyChart) OR . A paper copy in the mail If you have any lab test that is abnormal or we need to change your treatment, we will call you to review the results.  Testing/Procedures: None ordered  Follow-Up: . Follow up with Dr. Irish Lack via VIDEO Visit on 07/09/18 at 1:40 PM  Any Other Special Instructions Will Be Listed Below (If Applicable).  LET us KNOW IF YOUR SYMPTOMS CHANGE OR WORSEN

## 2018-06-10 ENCOUNTER — Encounter: Payer: Self-pay | Admitting: Internal Medicine

## 2018-07-01 ENCOUNTER — Encounter: Payer: Self-pay | Admitting: Internal Medicine

## 2018-07-01 ENCOUNTER — Telehealth: Payer: Self-pay | Admitting: *Deleted

## 2018-07-01 ENCOUNTER — Other Ambulatory Visit: Payer: Self-pay

## 2018-07-01 ENCOUNTER — Ambulatory Visit (INDEPENDENT_AMBULATORY_CARE_PROVIDER_SITE_OTHER): Payer: Medicare Other | Admitting: Internal Medicine

## 2018-07-01 VITALS — Ht 65.0 in | Wt 217.0 lb

## 2018-07-01 DIAGNOSIS — D649 Anemia, unspecified: Secondary | ICD-10-CM

## 2018-07-01 DIAGNOSIS — R140 Abdominal distension (gaseous): Secondary | ICD-10-CM | POA: Diagnosis not present

## 2018-07-01 DIAGNOSIS — K219 Gastro-esophageal reflux disease without esophagitis: Secondary | ICD-10-CM

## 2018-07-01 DIAGNOSIS — K5904 Chronic idiopathic constipation: Secondary | ICD-10-CM

## 2018-07-01 DIAGNOSIS — K581 Irritable bowel syndrome with constipation: Secondary | ICD-10-CM

## 2018-07-01 MED ORDER — LINACLOTIDE 145 MCG PO CAPS: 145.0000 ug | ORAL_CAPSULE | Freq: Every day | ORAL | 2 refills | Status: DC

## 2018-07-01 NOTE — Progress Notes (Signed)
Subjective:    Patient ID: Stephanie Parks, female    DOB: 19-Feb-1945, 73 y.o.   MRN: [id number]  This service was provided via telemedicine.  Telephone encounter The patient was located at home The provider was located in provider's GI office. The patient did consent to this telephone visit and is aware of possible charges through their insurance for this visit.   The persons participating in this telemedicine service were the patient and I. Time spent on call: 16 minutes   HPI Stephanie Parks is a 73 year old female with a history of chronic constipation with associated abdominal discomfort and bloating, colonic diverticulosis, adenomatous colon polyps, GERD with hiatal hernia who is seen for follow-up.  She is seen virtually today in the setting of COVID-19.  She was seen last at the time of her colonoscopy which was performed on 02/19/2018.  This revealed 3 sessile polyps ranging in size from 3 to 5 mm.  These were removed.  There was diverticulosis in the sigmoid.  And the exam was otherwise unremarkable.  These polyps were found to be adenomatous.  She has continued to deal with constipation symptoms and infrequent bowel movement.  She has a bowel movement maybe 2 to 3 days/week and bowel movements are often hard and incomplete.  She has not had rectal bleeding or melena.  She does have associated abdominal bloating and lower abdominal discomfort and pressure symptoms.  The symptoms improved with bowel movement.  She is tried over-the-counter laxatives including Dulcolax, Senokot, MiraLAX, fiber supplementation none of which help consistently.  We tried prescribing Linzess in December but this was cost prohibitive and thus not tried.  We tried a lower dose Amitiza which was ineffective and we tried to increase to the 24 mcg dose twice daily this medication was also cost prohibitive.  Her reflux is under better control since changing to pantoprazole 40 mg daily.  She is not having issues with  heartburn recently.  No dysphagia or odynophagia.  Appetite has been good.  She has started taking a vitamin C supplement and she feels that this has improved her energy levels.  Of note, she was seen in the ER on 03/24/2018 with poor appetite nausea and vomiting.  She also had left lower quadrant pain.  CT scan was performed of the abdomen pelvis on this date which showed possible sigmoid diverticulitis.  She was treated for such and these acute symptoms did improve.   Review of Systems As per HPI, otherwise negative  Current Medications, Allergies, Past Medical History, Past Surgical History, Family History and Social History were reviewed in [employer] [employer] record.     Objective:   Physical Exam No PE, virtual visit  CBC    Component Value Date/Time   WBC 11.1 (H) 05/22/2018 2011   RBC 3.68 (L) 05/22/2018 2011   HGB 10.5 (L) 05/22/2018 2011   HCT 33.6 (L) 05/22/2018 2011   PLT 338 05/22/2018 2011   MCV 91.3 05/22/2018 2011   MCH 28.5 05/22/2018 2011   MCHC 31.3 05/22/2018 2011   RDW 16.1 (H) 05/22/2018 2011   LYMPHSABS 3.0 05/22/2018 2011   MONOABS 0.6 05/22/2018 2011   EOSABS 0.0 05/22/2018 2011   BASOSABS 0.0 05/22/2018 2011   CMP     Component Value Date/Time   NA 138 05/22/2018 2011   NA 144 08/30/2015   K 3.5 05/22/2018 2011   CL 101 05/22/2018 2011   CO2 28 05/22/2018 2011   GLUCOSE 107 (H) 05/22/2018  2011   BUN 16 05/22/2018 2011   BUN 13 08/30/2015   CREATININE 0.84 05/22/2018 2011   CALCIUM 8.6 (L) 05/22/2018 2011   PROT 7.1 03/24/2018 1826   ALBUMIN 3.4 (L) 03/24/2018 1826   AST 21 03/24/2018 1826   ALT 15 03/24/2018 1826   ALKPHOS 56 03/24/2018 1826   BILITOT 0.4 03/24/2018 1826   GFRNONAA >60 05/22/2018 2011   GFRAA >60 05/22/2018 2011   CT ABDOMEN AND PELVIS WITH CONTRAST   TECHNIQUE: Multidetector CT imaging of the abdomen and pelvis was performed using the standard protocol following bolus administration of intravenous  contrast.   CONTRAST:  156mL ISOVUE-300 IOPAMIDOL (ISOVUE-300) INJECTION 61%   COMPARISON:  CT Abdomen and Pelvis 12/03/2017.   FINDINGS: Lower chest: Incidental chronic post granulomatous changes in the right lower lung including small calcified right hilar lymph nodes. No acute findings. No pericardial or pleural effusion.   Hepatobiliary: Stable and negative liver, small low-density probable benign cyst in the left lobe. Negative gallbladder.   Pancreas: Negative.   Spleen: Negative.   Adrenals/Urinary Tract: Normal adrenal glands.   Bilateral renal enhancement and contrast excretion is symmetric and within normal limits. Normal proximal ureters. No definite nephrolithiasis. Diminutive and unremarkable urinary bladder.   Stomach/Bowel: Decompressed and negative rectum.   Mild to moderate diverticulosis of the sigmoid colon, with mild active inflammation suggested on coronal image 70. Mild mesenteric stranding there about a diverticulum. No extraluminal gas or fluid.   Oral contrast mixed with stool in the descending colon. Retained stool throughout redundant transverse colon. Mild diverticulosis in the right colon. Normal appendix (series 2, image 57).   Mild diverticulosis of the terminal ileum which otherwise appears normal. No dilated small bowel. Small gastric hiatal hernia. Negative intraabdominal stomach. Negative duodenum.   No free air, free fluid.   Vascular/Lymphatic: Mild calcified atherosclerosis. Major arterial structures in the abdomen and pelvis are patent. Portal venous system is patent.   Reproductive: Surgically absent uterus. Normal ovaries.   Other: No pelvic free fluid. Chronic fat containing left inguinal hernia is stable.   Musculoskeletal: Advanced chronic lower thoracic and lower lumbar disc degeneration. Chronic lumbar posterior element degeneration. No acute osseous abnormality identified.   IMPRESSION: 1. Very mild acute sigmoid  diverticulitis suspected, with a small area of focal diverticular inflammation demonstrated on coronal image 70. No complicating features. 2. No other acute or inflammatory process in the abdomen or pelvis. Moderate volume of retained stool in the colon.     Electronically Signed   By: Genevie Ann M.D.   On: 03/24/2018 23:53      Assessment & Plan:  73 year old female with a history of chronic constipation with associated abdominal discomfort and bloating, colonic diverticulosis, adenomatous colon polyps, GERD with hiatal hernia who is seen for follow-up.  1.  Constipation with abdominal bloating and discomfort --she has tried all available over-the-counter laxatives without consistent success.  Linzess is usually covered by Medicare and so I would like her to try this medication again.  I also asked that she let me know if this is cost prohibitive --Begin Linzess 145 mcg daily, 30 minutes before first meal of the day.  She was asked to notify me if she develops diarrhea or other side effect. --Please place 2 weeks of Linzess samples, 145 mcg, at our front desk and the patient will come to pick up  2.  GERD --well-controlled currently on pantoprazole. --Continue pantoprazole 40 mg daily along with GERD diet and reflux precautions  3.  Anemia --persistent normocytic anemia for unclear reason.  I have recommended that we repeat her CBC along with iron studies, B12 and folate --Please place order for CBC, B12, folate and iron studies  39-month follow-up, sooner if needed

## 2018-07-01 NOTE — Patient Instructions (Addendum)
We have sent the following medications to your pharmacy for you to pick up at your convenience: Linzess 145 mcg daily, 30 minutes before first meal of the day. Please notify use if you develop diarrhea or other side effects. . Continue pantoprazole 40 mg daily along with GERD diet and reflux precautions.  Your provider has requested that you go to the basement level for lab work. Press "B" on the elevator. The lab is located at the first door on the left as you exit the elevator.  Please follow up with Dr Hilarie Fredrickson in the office in 3 months.

## 2018-07-01 NOTE — Telephone Encounter (Signed)
error 

## 2018-07-01 NOTE — Addendum Note (Signed)
Addended by: Larina Bras on: 07/01/2018 05:36 PM   Modules accepted: Orders

## 2018-07-02 ENCOUNTER — Telehealth: Payer: Self-pay

## 2018-07-02 ENCOUNTER — Other Ambulatory Visit (INDEPENDENT_AMBULATORY_CARE_PROVIDER_SITE_OTHER): Payer: Medicare Other

## 2018-07-02 DIAGNOSIS — K581 Irritable bowel syndrome with constipation: Secondary | ICD-10-CM | POA: Diagnosis not present

## 2018-07-02 DIAGNOSIS — K219 Gastro-esophageal reflux disease without esophagitis: Secondary | ICD-10-CM | POA: Diagnosis not present

## 2018-07-02 DIAGNOSIS — R140 Abdominal distension (gaseous): Secondary | ICD-10-CM | POA: Diagnosis not present

## 2018-07-02 DIAGNOSIS — D649 Anemia, unspecified: Secondary | ICD-10-CM

## 2018-07-02 DIAGNOSIS — K5904 Chronic idiopathic constipation: Secondary | ICD-10-CM | POA: Diagnosis not present

## 2018-07-02 LAB — CBC WITH DIFFERENTIAL/PLATELET
Basophils Absolute: 0 10*3/uL (ref 0.0–0.1)
Basophils Relative: 0.5 % (ref 0.0–3.0)
Eosinophils Absolute: 0.4 10*3/uL (ref 0.0–0.7)
Eosinophils Relative: 5.4 % — ABNORMAL HIGH (ref 0.0–5.0)
HCT: 35.8 % — ABNORMAL LOW (ref 36.0–46.0)
Hemoglobin: 12.1 g/dL (ref 12.0–15.0)
Lymphocytes Relative: 39.4 % (ref 12.0–46.0)
Lymphs Abs: 2.9 10*3/uL (ref 0.7–4.0)
MCHC: 33.9 g/dL (ref 30.0–36.0)
MCV: 87.8 fl (ref 78.0–100.0)
Monocytes Absolute: 0.5 10*3/uL (ref 0.1–1.0)
Monocytes Relative: 6.3 % (ref 3.0–12.0)
Neutro Abs: 3.6 10*3/uL (ref 1.4–7.7)
Neutrophils Relative %: 48.4 % (ref 43.0–77.0)
Platelets: 318 10*3/uL (ref 150.0–400.0)
RBC: 4.08 Mil/uL (ref 3.87–5.11)
RDW: 15.4 % (ref 11.5–15.5)
WBC: 7.3 10*3/uL (ref 4.0–10.5)

## 2018-07-02 LAB — IBC + FERRITIN
Ferritin: 22.3 ng/mL (ref 10.0–291.0)
Iron: 78 ug/dL (ref 42–145)
Saturation Ratios: 21.1 % (ref 20.0–50.0)
Transferrin: 264 mg/dL (ref 212.0–360.0)

## 2018-07-02 LAB — VITAMIN B12: Vitamin B-12: 403 pg/mL (ref 211–911)

## 2018-07-02 NOTE — Telephone Encounter (Signed)
Pt called to let Dr. Hilarie Fredrickson know that the Linzess prescription that he sent to the pharmacy will cost her 141.00. Pt states she cannot afford this amount. Dr. Hilarie Fredrickson notified.

## 2018-07-06 NOTE — Telephone Encounter (Signed)
Okay, unfortunately the prescription laxatives despite being covered in part by her insurance are remaining cost prohibitive I would recommend that she begin senna 2 tablets nightly and add MiraLAX 17 g on a daily basis if needed She can let me know how this is working  I certainly would like to help her with her constipation but the prescription laxatives have not been available to her for financial reasons

## 2018-07-06 NOTE — Telephone Encounter (Signed)
Patient notified of recommendations.   She will call back if this fails to help

## 2018-07-09 ENCOUNTER — Telehealth: Payer: Medicare Other | Admitting: Interventional Cardiology

## 2018-07-14 ENCOUNTER — Telehealth: Payer: Self-pay | Admitting: Interventional Cardiology

## 2018-07-14 ENCOUNTER — Other Ambulatory Visit: Payer: Self-pay | Admitting: Physician Assistant

## 2018-07-14 NOTE — Telephone Encounter (Signed)
WOuld order coronary CT

## 2018-07-14 NOTE — Telephone Encounter (Signed)
New Message   Pt c/o of Chest Pain: STAT if CP now or developed within 24 hours  1. Are you having CP right now? No  2. Are you experiencing any other symptoms (ex. SOB, nausea, vomiting, sweating)? NO patient had chills earlier  3. How long have you been experiencing CP? Just today.  4. Is your CP continuous or coming and going? Coming and going   5. Have you taken Nitroglycerin? NO   Patient states She had weakness in L arm and Pain in breast area around her heart @ 3am this morning. ?

## 2018-07-14 NOTE — Telephone Encounter (Signed)
Called and spoke to patient. She states that she has has intermittent episodes of weakness in her left arm. She states that this has been going on even before her last visit with Dr. Irish Lack on 4/9. She states that she woke up at 3 am with pain under her left breast that she rates 4/10. She does not have NTG. She states that she took 2 tylenol and it went away. She denies SOB, N/V, sweating, or any other Sx. No vitals available. She denies having any Sx at this time. Patient has an appointment with Ermalinda Barrios, PA on 6/2. Patient is wanting to know if Dr. Irish Lack would like for her to have a stress test.

## 2018-07-16 ENCOUNTER — Other Ambulatory Visit: Payer: Self-pay

## 2018-07-16 ENCOUNTER — Telehealth: Payer: Self-pay | Admitting: Interventional Cardiology

## 2018-07-16 ENCOUNTER — Other Ambulatory Visit: Payer: Self-pay | Admitting: Neurosurgery

## 2018-07-16 ENCOUNTER — Encounter (HOSPITAL_BASED_OUTPATIENT_CLINIC_OR_DEPARTMENT_OTHER): Payer: Self-pay | Admitting: *Deleted

## 2018-07-16 ENCOUNTER — Telehealth: Payer: Self-pay

## 2018-07-16 ENCOUNTER — Emergency Department (HOSPITAL_BASED_OUTPATIENT_CLINIC_OR_DEPARTMENT_OTHER)
Admission: EM | Admit: 2018-07-16 | Discharge: 2018-07-16 | Disposition: A | Discharge status: Home / Self Care | Payer: Medicare Other | Attending: Emergency Medicine | Admitting: Emergency Medicine

## 2018-07-16 DIAGNOSIS — Z7982 Long term (current) use of aspirin: Secondary | ICD-10-CM | POA: Insufficient documentation

## 2018-07-16 DIAGNOSIS — I10 Essential (primary) hypertension: Secondary | ICD-10-CM | POA: Insufficient documentation

## 2018-07-16 DIAGNOSIS — I671 Cerebral aneurysm, nonruptured: Secondary | ICD-10-CM

## 2018-07-16 DIAGNOSIS — Z79899 Other long term (current) drug therapy: Secondary | ICD-10-CM | POA: Insufficient documentation

## 2018-07-16 DIAGNOSIS — R51 Headache: Secondary | ICD-10-CM | POA: Diagnosis not present

## 2018-07-16 DIAGNOSIS — Z87891 Personal history of nicotine dependence: Secondary | ICD-10-CM | POA: Diagnosis not present

## 2018-07-16 DIAGNOSIS — I2510 Atherosclerotic heart disease of native coronary artery without angina pectoris: Secondary | ICD-10-CM | POA: Diagnosis not present

## 2018-07-16 DIAGNOSIS — G4489 Other headache syndrome: Secondary | ICD-10-CM | POA: Diagnosis not present

## 2018-07-16 NOTE — Telephone Encounter (Signed)
Called and spoke to patient. Made her aware that Dr. Irish Lack would like to order a Cardiac CT. Patient was in the ER earlier today for HA. They have scheduled Head CT w&w/o contrast for Tuesday. Made Dr. Irish Lack aware. We will postpone her Cardiac CT for a few weeks after head CT with repeat BMET before. Patient will keep virtual visit with Ermalinda Barrios, PA on 6/2. Will arrange Cardiac CT at this visit.

## 2018-07-16 NOTE — Telephone Encounter (Signed)
Patient called the [place] and pec transferred patient to our front office rep without triaging. Gwen Davis front office rep came to get me to triage patient as she felt this was more serious than to just schedule a virtual visit. I agreed and proceeded to triage patient. She states she has been having head pressure, and pain along with weakness in left arm. States "I just have severe weakness". These symptoms have been since yesterday. No sharp pain in head just states the dull pain is in her temple area. She has had off and on shortness breath but, no chest pain. Denies slurring of words and facial droopiness. I explained to the patient it was best for her to go to the ER to be evaluated, she immediately agreed. I informed her to call a family member to drive her as I do not feel comfortable with her driving in pain such as this. She agreed and stated she is going to call her son now. Routed to PCP for fyi.

## 2018-07-16 NOTE — Telephone Encounter (Signed)
New Message    Pt is calling for Stephanie Parks   Please call back

## 2018-07-16 NOTE — Discharge Instructions (Signed)
1.  You must schedule an appointment with your neurologist for a follow-up visit.  You have had several months of symptoms.  You very much need to be seen by a specialist to help develop a treatment plan and any other further tests if needed.

## 2018-07-16 NOTE — ED Notes (Addendum)
C/o left side head pressure  Feels like something running, arm numbness and weakness, lasting < 10 seconds,   This Comes and goes for months  Has been seen for same several times

## 2018-07-16 NOTE — Telephone Encounter (Signed)
See phone note from 5/26

## 2018-07-16 NOTE — ED Triage Notes (Signed)
Pressure in her head that feels like water running down her head with left arm weakness on and off since February. She has been seen numerous times with no diagnosis.

## 2018-07-16 NOTE — Telephone Encounter (Signed)
Great, thank you Mel Almond

## 2018-07-16 NOTE — ED Provider Notes (Signed)
[facility] EMERGENCY DEPARTMENT Provider Note   CSN: [id number] Arrival date & time: 07/16/18  1056    History   Chief Complaint Chief Complaint  Patient presents with  . Headache    HPI IVETH HEIDEMANN is a 73 y.o. female.     HPI Patient has been having headaches for months.  She reports that she has been going to multiple ED's for evaluation but despite testing, no specific diagnosis has been given to her.  She reports she is sure there is something wrong.  She gets brief pressure sensations around her temple on the left.  She reports they last for several seconds at a time and come and go.  She reports she also has some sensation of something "running down her head".  She does not have any visual symptoms in association with this.  No double vision no loss of vision.  No nausea no vomiting.  She also reports she feels like her left arm gets numb intermittently.  This also comes and goes.  She does not find that she is dropping things or is weak to function.  Both of these symptoms have been several months in duration or more.  Patient has a history of a stable right sided anterior aneurysm.  She reports she has not followed up with her neurosurgeon or neurology since developing the symptoms.  She is otherwise well.  No fevers, no chills.  No chest pain or shortness of breath.  Patient reports she is compliant with her blood pressure medications. Past Medical History:  Diagnosis Date  . 3-vessel CAD 03/01/2015  . Achalasia 09/28/2012  . Allergic rhinitis 03/01/2015  . Aneurysm ([place]) 03/01/2015  . BP (high blood pressure) 03/01/2015  . Bronchitis 04/23/2018  . Cephalalgia 08/24/2012   Overview:  ICD-10 cut over    . Chest pain 04/04/2011  . CN (constipation) 09/28/2012  . Coughing 04/24/2017  . Decreased potassium in the blood 03/01/2015  . Diverticulosis   . Dizziness 03/01/2015  . GERD (gastroesophageal reflux disease)   . Hiatal hernia   . Hypercholesterolemia 03/01/2015  .  Hyperlipidemia   . Hypertension   . Ingrown toenail 08/10/2017  . Inguinal hernia   . Migraine headache   . Onychomycosis 12/08/2017  . Paresthesia of arm 03/01/2015  . Schatzki's ring   . Tendonitis, Achilles, right 12/08/2017  . Unilateral primary osteoarthritis, left knee 10/07/2017    Patient Active Problem List   Diagnosis Date Noted  . Bronchitis 04/23/2018  . Tendonitis, Achilles, right 12/08/2017  . Onychomycosis 12/08/2017  . Unilateral primary osteoarthritis, left knee 10/07/2017  . Ingrown toenail 08/10/2017  . Coughing 04/24/2017  . 3-vessel CAD 03/01/2015  . Aneurysm ([place]) 03/01/2015  . Dizziness 03/01/2015  . Hypercholesterolemia 03/01/2015  . Decreased potassium in the blood 03/01/2015  . Paresthesia of arm 03/01/2015  . Allergic rhinitis 03/01/2015  . Achalasia 09/28/2012  . CN (constipation) 09/28/2012  . Cephalalgia 08/24/2012  . HTN (hypertension) 04/19/2011  . Hyperlipidemia 04/19/2011  . GERD (gastroesophageal reflux disease) 04/19/2011  . Chest pain 04/04/2011    Past Surgical History:  Procedure Laterality Date  . ABDOMINAL HYSTERECTOMY    . BACK SURGERY     x 3  . CARPAL TUNNEL RELEASE     left wrist  . CERVICAL SPINE SURGERY    . HEMORRHOID SURGERY       OB History   No obstetric history on file.      Home Medications    Prior  to Admission medications   Medication Sig Start Date End Date Taking? Authorizing Provider  acetaminophen-codeine (TYLENOL #3) 300-30 MG tablet Take 1-2 tablets by mouth every 8 (eight) hours as needed for moderate pain. 05/14/18   Copland, Gay Filler, MD  alendronate (FOSAMAX) 10 MG tablet TAKE 1 TABLET BY MOUTH DAILY BEFORE BREAKFAST. TAKE WITH A FULL GLASS OF WATER ON EMPTY STOMACH 03/17/18   Copland, Gay Filler, MD  aspirin 81 MG tablet Take 81 mg by mouth daily.    [provider]  azelastine (ASTELIN) 0.1 % nasal spray Place 2 sprays into both nostrils 2 (two) times daily as needed for rhinitis. Use in  each nostril as directed 04/24/17   Bobbitt, Sedalia Muta, MD  calcium carbonate (OS-CAL) 600 MG tablet Take 1 tablet (600 mg total) by mouth 2 (two) times daily with a meal. 09/20/16   Saguier, Percell Miller, PA-C  fluticasone (FLONASE) 50 MCG/ACT nasal spray USE 2 SPRAYS IN NOSTRIL(S) DAILY AS NEEDED. 01/14/18   Copland, Gay Filler, MD  KLOR-CON M20 20 MEQ tablet TAKE 1 TABLET BY MOUTH EVERY DAY 02/09/18   Copland, Gay Filler, MD  linaclotide (LINZESS) 145 MCG CAPS capsule Take 1 capsule (145 mcg total) by mouth daily before breakfast. 07/01/18   Pyrtle, Lajuan Lines, MD  lovastatin (MEVACOR) 20 MG tablet Take 1 tablet (20 mg total) by mouth daily. 01/22/18   Copland, Gay Filler, MD  methocarbamol (ROBAXIN) 500 MG tablet Take 1 tablet (500 mg total) by mouth every 8 (eight) hours as needed for muscle spasms. 01/28/18   Copland, Gay Filler, MD  Multiple Vitamins-Minerals (MULTIVITAL) tablet Take by mouth.    [provider]  NONFORMULARY OR COMPOUNDED ITEM Allergen immunotherapy 03/01/15   Leda Roys, MD  ondansetron (ZOFRAN ODT) 4 MG disintegrating tablet Take 1 tablet (4 mg total) by mouth every 8 (eight) hours as needed for nausea or vomiting. 03/25/18   Caccavale, Sophia, PA-C  pantoprazole (PROTONIX) 40 MG tablet TAKE 1 TABLET (40 MG TOTAL) BY MOUTH DAILY. TAKE 30-60 MINUTES BEFORE BREAKFAST. 07/14/18   Pyrtle, Lajuan Lines, MD  rosuvastatin (CRESTOR) 10 MG tablet Take 1 tablet (10 mg total) by mouth daily. 05/28/18   Jettie Booze, MD    Family History Family History  Problem Relation Age of Onset  . Allergic rhinitis Sister   . Diabetes Sister   . Hypertension Sister   . Heart attack Father 53  . Heart disease Father   . Stomach cancer Maternal Grandmother   . Heart disease Mother   . Colon cancer Neg Hx   . Angioedema Neg Hx   . Asthma Neg Hx   . Eczema Neg Hx   . Urticaria Neg Hx   . Immunodeficiency Neg Hx   . Breast cancer Neg Hx     Social History Social History   Tobacco Use  .  Smoking status: Former Smoker    Packs/day: 0.50    Years: 20.00    Pack years: 10.00    Types: Cigarettes    Last attempt to quit: 02/19/1984    Years since quitting: 34.4  . Smokeless tobacco: Never Used  Substance Use Topics  . Alcohol use: No  . Drug use: No     Allergies   Shellfish allergy; Ace inhibitors; Pitavastatin; and Sulfa antibiotics   Review of Systems Review of Systems 10 Systems reviewed and are negative for acute change except as noted in the HPI.   Physical Exam Updated Vital Signs BP (!) 165/74 (  BP Location: Right Arm)   Pulse 73   Temp 98.2 F (36.8 C) (Oral)   Resp 18   Ht 5\' 5"  (1.651 m)   Wt 99.4 kg   SpO2 98%   BMI 36.47 kg/m   Physical Exam Constitutional:      Comments: Patient is clinically well in appearance.  She is alert and in no distress.  Speech and movements are coordinated and normal.  HENT:     Head: Normocephalic and atraumatic.     Comments: Patient endorses that the area of tenderness is focal at her temple on the left.  Remainder exam of the scalp ear and head are normal.  No soft tissue anomalies.    Nose: Nose normal.     Mouth/Throat:     Mouth: Mucous membranes are moist.     Pharynx: Oropharynx is clear.     Comments: Normal range of motion of the jaw without trismus. Eyes:     Extraocular Movements: Extraocular movements intact.     Conjunctiva/sclera: Conjunctivae normal.     Pupils: Pupils are equal, round, and reactive to light.  Neck:     Musculoskeletal: Neck supple.     Comments: Neck is supple.  No lymphadenopathy or soft tissue anomalies. Cardiovascular:     Rate and Rhythm: Normal rate and regular rhythm.     Pulses: Normal pulses.     Heart sounds: Normal heart sounds.  Pulmonary:     Effort: Pulmonary effort is normal.     Breath sounds: Normal breath sounds.  Abdominal:     General: There is no distension.     Palpations: Abdomen is soft.     Tenderness: There is no abdominal tenderness. There is  no guarding.  Musculoskeletal: Normal range of motion.        General: No swelling or tenderness.     Right lower leg: No edema.     Left lower leg: No edema.     Comments: Left upper extremity is normal.  There is no soft tissue anomalies.  Normal range of motion.  Normal neurovascular exam.  Patient has symmetric grip strength push and pull as compared to the right arm.  Skin:    General: Skin is warm and dry.  Neurological:     Comments: Cranial nerves II through XII intact.  Motor exam upper is 5\5 symmetric for grip strength push pull.  Biceps tendon reflexes are 2+ and symmetric upper extremities.  Patient denies any sensory differential to light touch on the upper extremities.  Lower extremities 5\5 lection extension at the feet and hips.  2+ symmetric patellar reflexes.  No difference to light touch.  Psychiatric:        Mood and Affect: Mood normal.      ED Treatments / Results  Labs (all labs ordered are listed, but only abnormal results are displayed) Labs Reviewed - No data to display  EKG None  Radiology No results found.  Procedures Procedures (including critical care time)  Medications Ordered in ED Medications - No data to display   Initial Impression / Assessment and Plan / ED Course  I have reviewed the triage vital signs and the nursing notes.  Pertinent labs & imaging results that were available during my care of the patient were reviewed by me and considered in my medical decision making (see chart for details).       I have extensively reviewed EMR.  Patient has a stable anterior aneurysm that was reimaged  by Dr. Christella Noa by CT angios 1 year ago.  This has remained stable for the past 5 years that has been monitored.  Patient's headache does not correlate with this finding in any way.  Patient has seen by neurology in the past for headaches that were deemed migrainous in nature.  I do have higher suspicion for migraine in this instance as the headaches  come and go and they are rather brief with a sensory component of "something running down her head".  Headaches do not seem debilitating.  She has no associated visual symptoms or nausea or vomiting.  I have very low suspicion for stroke as the etiology.  Aches are not acute or severe is a be intact the pain it was a hemorrhagic type of headache.  Patient has a waxing and waning left upper extremity sensation.  She has had MRI of the cervical spine last year.  He does have some cervical stenosis.  This should be followed up.  She however has no weakness or deficit at this time.  Patient is counseled on the importance of specialty follow-up at this point time with multiple visits to the ED but underlying neurologic conditions of diagnosed migraines and possibly cervical spinal stenosis as well.  She voices understanding.  Contact information for the neurologist seen by her at [facility] included in her discharge instructions.  She also was advised she can contact her family doctor for further assistance.  Final Clinical Impressions(s) / ED Diagnoses   Final diagnoses:  Other headache syndrome    ED Discharge Orders    None       Charlesetta Shanks, MD 07/16/18 1256

## 2018-07-16 NOTE — Telephone Encounter (Signed)
Left message for patient to call back  

## 2018-07-20 ENCOUNTER — Ambulatory Visit (INDEPENDENT_AMBULATORY_CARE_PROVIDER_SITE_OTHER): Payer: Medicare Other | Admitting: Family Medicine

## 2018-07-20 ENCOUNTER — Other Ambulatory Visit: Payer: Self-pay

## 2018-07-20 ENCOUNTER — Encounter: Payer: Self-pay | Admitting: Family Medicine

## 2018-07-20 DIAGNOSIS — Z87898 Personal history of other specified conditions: Secondary | ICD-10-CM | POA: Insufficient documentation

## 2018-07-20 DIAGNOSIS — J0140 Acute pansinusitis, unspecified: Secondary | ICD-10-CM

## 2018-07-20 DIAGNOSIS — I671 Cerebral aneurysm, nonruptured: Secondary | ICD-10-CM | POA: Insufficient documentation

## 2018-07-20 MED ORDER — LEVOCETIRIZINE DIHYDROCHLORIDE 5 MG PO TABS: 5.0000 mg | ORAL_TABLET | Freq: Every evening | ORAL | 5 refills | Status: DC

## 2018-07-20 MED ORDER — AMOXICILLIN-POT CLAVULANATE 875-125 MG PO TABS: 1.0000 | ORAL_TABLET | Freq: Two times a day (BID) | ORAL | 0 refills | Status: DC

## 2018-07-20 NOTE — Progress Notes (Signed)
Virtual Visit via Video Note   This visit type was conducted due to national recommendations for restrictions regarding the COVID-19 Pandemic (e.g. social distancing) in an effort to limit this patient's exposure and mitigate transmission in our community.  Due to her co-morbid illnesses, this patient is at least at moderate risk for complications without adequate follow up.  This format is felt to be most appropriate for this patient at this time.  All issues noted in this document were discussed and addressed.  A limited physical exam was performed with this format.  Please refer to the patient's chart for her consent to telehealth for [facility].   Date:  07/21/2018   ID:  Stephanie Parks, DOB 1945/08/11, MRN [id number]  Patient Location: Home Provider Location: Home  PCP:  Copland, Gay Filler, MD  Cardiologist:  Larae Grooms, MD   Electrophysiologist:  None   Evaluation Performed:  Follow-Up Visit  Chief Complaint:  Chest pain  History of Present Illness:    Stephanie Parks is a 73 y.o. female with history of HTN, HLD, brain aneursym, cardiac cath in [facility] 2003 she was told she had minor plaque. Monitor 2018 NSR with occasional sinus tachycardia, PACs and PVCs.  Patient had telemedicine visit with Dr. Irish Lack 05/28/18 and complained of chest discomfort relieved with walking and belching. Went to [facility] in [place] and ruled out for MI. Stress test recommended.He stopped lovastatin and started crestor and held off on stress test b/c symptoms atypical.  Patient called in 07/14/2018 complaining of awakening at 3 AM with pain under her left breast that went away with Tylenol.  She does not have nitroglycerin.  Dr. Irish Lack plan to order coronary CT but she was in the emergency room with headaches and just had a head CT so decided to wait a couple weeks to repeat bmet. Diagnosed with sinus infection 07/20/18 and started on antibiotics.  Patient thinks she has migraines.  Having head CT. Patient complains of left sided chest pain off/on for 3 months. Sharp shooting quick pain, doesn't last long, comes and goes'. Denies chest tightness or pressure. She does have dyspnea when she moves too quickly. Also has some nausea and dizziness not associated with the chest pain. Ondansetron 4 mg really helps.  Was doing an exercise tape-sitting exercise and weights. Works at [employer] so is on her feet a lot. Has no way to check her BP at home. Says everything happens on her left side.  The patient does not have symptoms concerning for COVID-19 infection (fever, chills, cough, or new shortness of breath).    Past Medical History:  Diagnosis Date  . 3-vessel CAD 03/01/2015  . Achalasia 09/28/2012  . Allergic rhinitis 03/01/2015  . Aneurysm ([place]) 03/01/2015  . BP (high blood pressure) 03/01/2015  . Bronchitis 04/23/2018  . Cephalalgia 08/24/2012   Overview:  ICD-10 cut over    . Chest pain 04/04/2011  . CN (constipation) 09/28/2012  . Coughing 04/24/2017  . Decreased potassium in the blood 03/01/2015  . Diverticulosis   . Dizziness 03/01/2015  . GERD (gastroesophageal reflux disease)   . Hiatal hernia   . Hypercholesterolemia 03/01/2015  . Hyperlipidemia   . Hypertension   . Ingrown toenail 08/10/2017  . Inguinal hernia   . Migraine headache   . Onychomycosis 12/08/2017  . Paresthesia of arm 03/01/2015  . Schatzki's ring   . Tendonitis, Achilles, right 12/08/2017  . Unilateral primary osteoarthritis, left knee 10/07/2017   Past Surgical History:  Procedure Laterality Date  . ABDOMINAL HYSTERECTOMY    . BACK SURGERY     x 3  . CARPAL TUNNEL RELEASE     left wrist  . CERVICAL SPINE SURGERY    . HEMORRHOID SURGERY       Current Meds  Medication Sig  . alendronate (FOSAMAX) 10 MG tablet TAKE 1 TABLET BY MOUTH DAILY BEFORE BREAKFAST. TAKE WITH A FULL GLASS OF WATER ON EMPTY STOMACH  . amoxicillin-clavulanate (AUGMENTIN) 875-125 MG tablet Take 1 tablet by mouth 2 (two) times  daily.  Marland Kitchen aspirin 81 MG tablet Take 81 mg by mouth daily.  Marland Kitchen azelastine (ASTELIN) 0.1 % nasal spray Place 2 sprays into both nostrils 2 (two) times daily as needed for rhinitis. Use in each nostril as directed  . Calcium Carbonate (CALCIUM 600 PO) Take 1 tablet by mouth daily.  . fluticasone (FLONASE) 50 MCG/ACT nasal spray USE 2 SPRAYS IN NOSTRIL(S) DAILY AS NEEDED.  Marland Kitchen KLOR-CON M20 20 MEQ tablet TAKE 1 TABLET BY MOUTH EVERY DAY  . levocetirizine (XYZAL) 5 MG tablet Take 1 tablet (5 mg total) by mouth every evening.  . methocarbamol (ROBAXIN) 500 MG tablet Take 1 tablet (500 mg total) by mouth every 8 (eight) hours as needed for muscle spasms.  . Multiple Vitamins-Minerals (MULTIVITAL) tablet Take by mouth.  . NONFORMULARY OR COMPOUNDED ITEM Allergen immunotherapy  . ondansetron (ZOFRAN ODT) 4 MG disintegrating tablet Take 1 tablet (4 mg total) by mouth every 8 (eight) hours as needed for nausea or vomiting.  . pantoprazole (PROTONIX) 40 MG tablet TAKE 1 TABLET (40 MG TOTAL) BY MOUTH DAILY. TAKE 30-60 MINUTES BEFORE BREAKFAST.  . rosuvastatin (CRESTOR) 10 MG tablet Take 1 tablet (10 mg total) by mouth daily.     Allergies:   Shellfish allergy; Ace inhibitors; Pitavastatin; and Sulfa antibiotics   Social History   Tobacco Use  . Smoking status: Former Smoker    Packs/day: 0.50    Years: 20.00    Pack years: 10.00    Types: Cigarettes    Last attempt to quit: 02/19/1984    Years since quitting: 34.4  . Smokeless tobacco: Never Used  Substance Use Topics  . Alcohol use: No  . Drug use: No     Family Hx: The patient's family history includes Allergic rhinitis in her sister; Diabetes in her sister; Heart attack (age of onset: 85) in her father; Heart disease in her father and mother; Hypertension in her sister; Stomach cancer in her maternal grandmother. There is no history of Colon cancer, Angioedema, Asthma, Eczema, Urticaria, Immunodeficiency, or Breast cancer.  ROS:   Please see the  history of present illness.      All other systems reviewed and are negative.   Prior CV studies:   The following studies were reviewed today:  2DEcho February 2013: Left ventricle: The cavity size was normal. Systolic function was normal. The estimated ejection fraction was in the range of 55% to 60%. Wall motion was normal; there were no regional wall motion abnormalities. Doppler parameters are consistent with abnormal left ventricular relaxation (grade 1 diastolic dysfunction). - Atrial septum: No defect or patent foramen ovale was identified.   Labs/Other Tests and Data Reviewed:    EKG:  An ECG dated 05/25/18 was personally reviewed today and demonstrated:  NSR no acute change  Recent Labs: 03/24/2018: ALT 15; Magnesium 1.9 05/07/2018: TSH 2.93 05/22/2018: BUN 16; Creatinine, Ser 0.84; Potassium 3.5; Sodium 138 07/02/2018: Hemoglobin 12.1; Platelets 318.0   Recent  Lipid Panel Lab Results  Component Value Date/Time   CHOL 189 05/01/2016 01:10 PM   TRIG 149.0 05/01/2016 01:10 PM   HDL 60.60 05/01/2016 01:10 PM   CHOLHDL 3 05/01/2016 01:10 PM   LDLCALC 98 05/01/2016 01:10 PM    Wt Readings from Last 3 Encounters:  07/21/18 220 lb (99.8 kg)  07/16/18 219 lb 2.2 oz (99.4 kg)  07/01/18 217 lb (98.4 kg)     Objective:    Vital Signs:  Ht 5\' 5"  (1.651 m)   Wt 220 lb (99.8 kg)   BMI 36.61 kg/m    VITAL SIGNS:  reviewed GEN:  no acute distress RESPIRATORY:  normal respiratory effort, symmetric expansion CARDIOVASCULAR:  lower extremity edema noted on left ankle, not right.  ASSESSMENT & PLAN:    1. Chest pain somewhat atypical but persistent and Dr. Irish Lack recommends Coronary CTA. Will check Bmet after head CT today and if ok do Coronary CTA. 2. Nonobstructive CAD on cath in [facility] 2003 3. Essential HTN on losartan/HCTZ 50/12.5 mg daily. BP high in ER. Will see if we can send her a BP cuff.  4. HLD-just switched to crestor 05/2018 5. History of  palpitations-PACs & PVCs-hasn't had in several months. 6. History of brain aneurysm 2017  COVID-19 Education: The signs and symptoms of COVID-19 were discussed with the patient and how to seek care for testing (follow up with PCP or arrange E-visit).   The importance of social distancing was discussed today.  Time:   Today, I have spent 18 minutes with the patient with telehealth technology discussing the above problems.     Medication Adjustments/Labs and Tests Ordered: Current medicines are reviewed at length with the patient today.  Concerns regarding medicines are outlined above.   Tests Ordered: No orders of the defined types were placed in this encounter.   Medication Changes: No orders of the defined types were placed in this encounter.   Disposition:  Follow up in 2 month(s) with Dr. Irish Lack or Ermalinda Barrios PA-C after Coronary CTA  Signed, Ermalinda Barrios, PA-C  07/21/2018 10:01 AM    [place]

## 2018-07-20 NOTE — Progress Notes (Signed)
Virtual Visit via Video Note  I connected with Stephanie Parks on 07/20/18 at 65 am  by a video enabled telemedicine application and verified that I am speaking with the correct person using two identifiers.  Location: Patient: home  Provider: home    I discussed the limitations of evaluation and management by telemedicine and the availability of in person appointments. The patient expressed understanding and agreed to proceed.  History of Present Illness: Pt is home c/o sinus pressure/ headache x >1 weeks.  She has been using flonase with no relief.  No fevers.  No cough.   + nasal congestion and runny nose   Observations/Objective: Afebrile No other vitals obtained Pt in NAD  Assessment and Plan: 1. Acute pansinusitis, recurrence not specified con't flonase Use xyzal and abx per orders  - amoxicillin-clavulanate (AUGMENTIN) 875-125 MG tablet; Take 1 tablet by mouth 2 (two) times daily.  Dispense: 20 tablet; Refill: 0 - levocetirizine (XYZAL) 5 MG tablet; Take 1 tablet (5 mg total) by mouth every evening.  Dispense: 30 tablet; Refill: 5   Follow Up Instructions:    I discussed the assessment and treatment plan with the patient. The patient was provided an opportunity to ask questions and all were answered. The patient agreed with the plan and demonstrated an understanding of the instructions.   The patient was advised to call back or seek an in-person evaluation if the symptoms worsen or if the condition fails to improve as anticipated.  I provided 15 minutes of non-face-to-face time during this encounter.   Ann Held, DO

## 2018-07-21 ENCOUNTER — Encounter: Payer: Self-pay | Admitting: Physician Assistant

## 2018-07-21 ENCOUNTER — Telehealth (INDEPENDENT_AMBULATORY_CARE_PROVIDER_SITE_OTHER): Payer: Medicare Other | Admitting: Physician Assistant

## 2018-07-21 ENCOUNTER — Other Ambulatory Visit: Payer: Self-pay

## 2018-07-21 ENCOUNTER — Telehealth: Payer: Self-pay | Admitting: Licensed Clinical Social Worker

## 2018-07-21 ENCOUNTER — Ambulatory Visit
Admission: RE | Admit: 2018-07-21 | Discharge: 2018-07-21 | Disposition: A | Discharge status: Home / Self Care | Payer: Medicare Other | Source: Ambulatory Visit | Attending: Neurosurgery | Admitting: Neurosurgery

## 2018-07-21 VITALS — Ht 65.0 in | Wt 220.0 lb

## 2018-07-21 DIAGNOSIS — Z01812 Encounter for preprocedural laboratory examination: Secondary | ICD-10-CM

## 2018-07-21 DIAGNOSIS — Z87898 Personal history of other specified conditions: Secondary | ICD-10-CM

## 2018-07-21 DIAGNOSIS — I671 Cerebral aneurysm, nonruptured: Secondary | ICD-10-CM

## 2018-07-21 DIAGNOSIS — I10 Essential (primary) hypertension: Secondary | ICD-10-CM

## 2018-07-21 DIAGNOSIS — I2510 Atherosclerotic heart disease of native coronary artery without angina pectoris: Secondary | ICD-10-CM

## 2018-07-21 DIAGNOSIS — E785 Hyperlipidemia, unspecified: Secondary | ICD-10-CM

## 2018-07-21 MED ORDER — IOPAMIDOL (ISOVUE-370) INJECTION 76%
75.0000 mL | Freq: Once | INTRAVENOUS | Status: AC | PRN
  Administered 2018-07-21: 75 mL via INTRAVENOUS

## 2018-07-21 MED ORDER — METOPROLOL TARTRATE 50 MG PO TABS: ORAL_TABLET | ORAL | 0 refills | Status: DC

## 2018-07-21 NOTE — Patient Instructions (Signed)
Medication Instructions:  Your physician recommends that you continue on your current medications as directed. Please refer to the Current Medication list given to you today.  If you need a refill on your cardiac medications before your next appointment, please call your pharmacy.   Lab work: Your physician recommends that you return for lab work Artist) prior to Cardiac CT  If you have labs (blood work) drawn today and your tests are completely normal, you will receive your results only by: Marland Kitchen MyChart Message (if you have MyChart) OR . A paper copy in the mail If you have any lab test that is abnormal or we need to change your treatment, we will call you to review the results.  Testing/Procedures: Your physician has requested that you have cardiac CT. Cardiac computed tomography (CT) is a painless test that uses an x-ray machine to take clear, detailed pictures of your heart. For further information please visit [email or web address]. Please follow instruction sheet as given.   Follow-Up: Follow up with Dr. Irish Lack or Ermalinda Barrios, PA after Cardiac CT  Any Other Special Instructions Will Be Listed Below (If Applicable).  CARDIAC CT INSTRUCTIONS  Please arrive at the [facility] main entrance of [facility] on ___ at ___ (30-45 minutes prior to test start time)  [facility] [place],  [id number] [phone]  Proceed to the [facility] Radiology Department (First Floor).  Please follow these instructions carefully (unless otherwise directed):   On the Night Before the Test: . Be sure to Drink plenty of water. . Do not consume any caffeinated/decaffeinated beverages or chocolate 12 hours prior to your test. . Do not take any antihistamines 12 hours prior to your test.   On the Day of the Test: . Drink plenty of water. Do not drink any water within one hour of the test. . Do not eat any food 4 hours prior to the test. . You may take  your regular medications prior to the test.  . Take metoprolol (Lopressor) two hours prior to test.     After the Test: . Drink plenty of water. . After receiving IV contrast, you may experience a mild flushed feeling. This is normal. . On occasion, you may experience a mild rash up to 24 hours after the test. This is not dangerous. If this occurs, you can take Benadryl 25 mg and increase your fluid intake. . If you experience trouble breathing, this can be serious. If it is severe call 911 IMMEDIATELY. If it is mild, please call our office.

## 2018-07-21 NOTE — Telephone Encounter (Signed)
CSW referred to assist patient with obtaining a BP cuff. CSW contacted patient to inform cuff will be delivered to home next week. Patient grateful for support and assistance. CSW available as needed. Jackie Bobbie Valletta, LCSW, CCSW-MCS [phone]  

## 2018-07-23 DIAGNOSIS — I671 Cerebral aneurysm, nonruptured: Secondary | ICD-10-CM | POA: Diagnosis not present

## 2018-07-27 ENCOUNTER — Telehealth: Payer: Self-pay

## 2018-07-27 ENCOUNTER — Telehealth: Payer: Self-pay | Admitting: *Deleted

## 2018-07-27 NOTE — Telephone Encounter (Signed)
    COVID-19 Pre-Screening Questions:  . In the past 7 to 10 days have you had a cough,  shortness of breath, headache, congestion, fever (100 or greater) body aches, chills, sore throat, or sudden loss of taste or sense of smell? . Have you been around anyone with known Covid 19. . Have you been around anyone who is awaiting Covid 19 test results in the past 7 to 10 days? . Have you been around anyone who has been exposed to Covid 19, or has mentioned symptoms of Covid 19 within the past 7 to 10 days?  If you have any concerns/questions about symptoms patients report during screening (either on the phone or at threshold). Contact the provider seeing the patient or DOD for further guidance.  If neither are available contact a member of the leadership team.           Patent returned call. All Covid 19 questions were no and she has a mask.KB

## 2018-07-27 NOTE — Telephone Encounter (Signed)
    COVID-19 Pre-Screening Questions:  . In the past 7 to 10 days have you had a cough,  shortness of breath, headache, congestion, fever (100 or greater) body aches, chills, sore throat, or sudden loss of taste or sense of smell? . Have you been around anyone with known Covid 19. . Have you been around anyone who is awaiting Covid 19 test results in the past 7 to 10 days? . Have you been around anyone who has been exposed to Covid 19, or has mentioned symptoms of Covid 19 within the past 7 to 10 days?  If you have any concerns/questions about symptoms patients report during screening (either on the phone or at threshold). Contact the provider seeing the patient or DOD for further guidance.  If neither are available contact a member of the leadership team.       No answer left message for lab [phone] 07/27/2018

## 2018-07-28 ENCOUNTER — Other Ambulatory Visit: Payer: Self-pay

## 2018-07-28 ENCOUNTER — Other Ambulatory Visit: Payer: Medicare Other

## 2018-07-28 DIAGNOSIS — E785 Hyperlipidemia, unspecified: Secondary | ICD-10-CM | POA: Diagnosis not present

## 2018-07-28 DIAGNOSIS — Z01812 Encounter for preprocedural laboratory examination: Secondary | ICD-10-CM | POA: Diagnosis not present

## 2018-07-28 DIAGNOSIS — Z87898 Personal history of other specified conditions: Secondary | ICD-10-CM | POA: Diagnosis not present

## 2018-07-28 DIAGNOSIS — I10 Essential (primary) hypertension: Secondary | ICD-10-CM

## 2018-07-28 DIAGNOSIS — I2510 Atherosclerotic heart disease of native coronary artery without angina pectoris: Secondary | ICD-10-CM | POA: Diagnosis not present

## 2018-07-28 LAB — LIPID PANEL
Chol/HDL Ratio: 2.4 ratio (ref 0.0–4.4)
Cholesterol, Total: 176 mg/dL (ref 100–199)
HDL: 73 mg/dL (ref >39)
LDL Calculated: 93 mg/dL (ref 0–99)
Triglycerides: 51 mg/dL (ref 0–149)
VLDL Cholesterol Cal: 10 mg/dL (ref 5–40)

## 2018-07-28 LAB — HEPATIC FUNCTION PANEL
ALT: 14 IU/L (ref 0–32)
AST: 20 IU/L (ref 0–40)
Albumin: 4 g/dL (ref 3.7–4.7)
Alkaline Phosphatase: 62 IU/L (ref 39–117)
Bilirubin Total: 0.4 mg/dL (ref 0.0–1.2)
Bilirubin, Direct: 0.1 mg/dL (ref 0.00–0.40)
Total Protein: 6.2 g/dL (ref 6.0–8.5)

## 2018-07-28 LAB — BASIC METABOLIC PANEL
BUN/Creatinine Ratio: 13 (ref 12–28)
BUN: 11 mg/dL (ref 8–27)
CO2: 29 mmol/L (ref 20–29)
Calcium: 9.5 mg/dL (ref 8.7–10.3)
Chloride: 102 mmol/L (ref 96–106)
Creatinine, Ser: 0.88 mg/dL (ref 0.57–1.00)
GFR calc Af Amer: 76 mL/min/{1.73_m2} (ref >59)
GFR calc non Af Amer: 66 mL/min/{1.73_m2} (ref >59)
Glucose: 90 mg/dL (ref 65–99)
Potassium: 3.9 mmol/L (ref 3.5–5.2)
Sodium: 144 mmol/L (ref 134–144)

## 2018-07-30 ENCOUNTER — Other Ambulatory Visit: Payer: Self-pay | Admitting: Family Medicine

## 2018-07-30 NOTE — Telephone Encounter (Signed)
Copied from [place] [id number]. Topic: Quick Communication - Rx Refill/Question >> Jul 30, 2018  5:03 PM Blase Mess A wrote: Medication: ondansetron (ZOFRAN ODT) 4 MG disintegrating tablet [[id number]]   Has the patient contacted their pharmacy? Yes  (Agent: If no, request that the patient contact the pharmacy for the refill.) (Agent: If yes, when and what did the pharmacy advise?)  Preferred Pharmacy (with phone number or street name):   Agent: Please be advised that RX refills may take up to 3 business days. We ask that you follow-up with your pharmacy.

## 2018-07-31 ENCOUNTER — Other Ambulatory Visit (HOSPITAL_BASED_OUTPATIENT_CLINIC_OR_DEPARTMENT_OTHER): Payer: Self-pay | Admitting: Family Medicine

## 2018-07-31 DIAGNOSIS — Z1231 Encounter for screening mammogram for malignant neoplasm of breast: Secondary | ICD-10-CM

## 2018-07-31 MED ORDER — ONDANSETRON 4 MG PO TBDP: 4.0000 mg | ORAL_TABLET | Freq: Three times a day (TID) | ORAL | 0 refills | Status: DC | PRN

## 2018-08-03 ENCOUNTER — Telehealth: Payer: Self-pay

## 2018-08-03 DIAGNOSIS — R519 Headache, unspecified: Secondary | ICD-10-CM

## 2018-08-03 NOTE — Telephone Encounter (Signed)
Copied from [place] [id number]. Topic: Referral - Request for Referral >> Aug 03, 2018  2:25 PM Margot Ables wrote: Has patient seen PCP for this complaint? Yes - headaches *If NO, is insurance requiring patient see PCP for this issue before PCP can refer them? Referral for which specialty: Neurology Preferred provider/office: Dr. Sarina Ill at [facility] Reason for referral: ongoing headaches

## 2018-08-05 ENCOUNTER — Other Ambulatory Visit: Payer: Self-pay | Admitting: Family Medicine

## 2018-08-05 ENCOUNTER — Telehealth: Payer: Self-pay | Admitting: Neurology

## 2018-08-05 DIAGNOSIS — Z5181 Encounter for therapeutic drug level monitoring: Secondary | ICD-10-CM

## 2018-08-05 NOTE — Telephone Encounter (Signed)
Okay with me for switch. 

## 2018-08-05 NOTE — Telephone Encounter (Signed)
Pt wants to switch her care to Dr. Jaynee Eagles for headaches. pt was last seen 2019 by Dr. Jannifer Franklin. Please advise if it is ok for pt to switch

## 2018-08-05 NOTE — Telephone Encounter (Signed)
That's fine with me, thanks.

## 2018-08-06 NOTE — Telephone Encounter (Signed)
noted 

## 2018-08-07 ENCOUNTER — Telehealth: Payer: Self-pay

## 2018-08-07 NOTE — Telephone Encounter (Signed)
Patient spoke with provider. Medication refilled.

## 2018-08-07 NOTE — Telephone Encounter (Signed)
Copied from [place] [id number]. Topic: Quick Communication - See Telephone Encounter >> Jul 30, 2018 10:51 AM Loma Boston wrote: . CRM for notification. See Telephone encounter for: 07/30/18.pt is [id number] - [place], [place] [phone] (Phone) [phone] (Fax)  Significant History/Details  Pt wanting CB from Call back fro Dr. Lorelei Pont' s nurse in [place] in regards to med... ondansetron (ZOFRAN ODT) 4 MG disintegrating tablet. She saw a neurologist and said that he said everything is fine but she is still not feeling well and wants a refill of this med. Informed may need an appt but wanted the nurse to call her back first.

## 2018-08-18 ENCOUNTER — Other Ambulatory Visit (HOSPITAL_COMMUNITY): Payer: Self-pay | Admitting: Emergency Medicine

## 2018-08-18 DIAGNOSIS — I10 Essential (primary) hypertension: Secondary | ICD-10-CM

## 2018-08-19 ENCOUNTER — Telehealth (HOSPITAL_COMMUNITY): Payer: Self-pay | Admitting: Emergency Medicine

## 2018-08-19 NOTE — Telephone Encounter (Signed)
Left message on voicemail with name and callback number Noa Constante RN Navigator Cardiac [facility] [phone] Office [phone] Cell  

## 2018-08-20 ENCOUNTER — Ambulatory Visit (HOSPITAL_COMMUNITY)
Admission: RE | Admit: 2018-08-20 | Discharge: 2018-08-20 | Disposition: A | Discharge status: Home / Self Care | Payer: Medicare Other | Source: Ambulatory Visit | Attending: Interventional Cardiology | Admitting: Interventional Cardiology

## 2018-08-20 ENCOUNTER — Encounter (HOSPITAL_COMMUNITY): Payer: Self-pay

## 2018-08-20 ENCOUNTER — Other Ambulatory Visit: Payer: Self-pay

## 2018-08-20 ENCOUNTER — Ambulatory Visit (HOSPITAL_COMMUNITY): Payer: Medicare Other

## 2018-08-20 DIAGNOSIS — I10 Essential (primary) hypertension: Secondary | ICD-10-CM | POA: Diagnosis not present

## 2018-08-20 DIAGNOSIS — I2510 Atherosclerotic heart disease of native coronary artery without angina pectoris: Secondary | ICD-10-CM | POA: Diagnosis not present

## 2018-08-20 DIAGNOSIS — E785 Hyperlipidemia, unspecified: Secondary | ICD-10-CM | POA: Diagnosis not present

## 2018-08-20 DIAGNOSIS — Z87898 Personal history of other specified conditions: Secondary | ICD-10-CM

## 2018-08-20 MED ORDER — NITROGLYCERIN 0.4 MG SL SUBL
SUBLINGUAL_TABLET | SUBLINGUAL | Status: AC
  Filled 2018-08-20: qty 2

## 2018-08-20 MED ORDER — NITROGLYCERIN 0.4 MG SL SUBL: 0.8000 mg | SUBLINGUAL_TABLET | Freq: Once | SUBLINGUAL | Status: DC

## 2018-08-20 MED ORDER — IOHEXOL 350 MG/ML SOLN
80.0000 mL | Freq: Once | INTRAVENOUS | Status: AC | PRN
  Administered 2018-08-20: 10:00:00 80 mL via INTRAVENOUS

## 2018-09-02 ENCOUNTER — Encounter: Payer: Self-pay | Admitting: Neurology

## 2018-09-02 ENCOUNTER — Telehealth: Payer: Self-pay | Admitting: Neurology

## 2018-09-02 ENCOUNTER — Ambulatory Visit: Payer: Medicare Other | Admitting: Neurology

## 2018-09-02 ENCOUNTER — Other Ambulatory Visit: Payer: Self-pay

## 2018-09-02 VITALS — BP 132/73 | HR 63 | Temp 97.8°F | Ht 65.0 in | Wt 220.0 lb

## 2018-09-02 DIAGNOSIS — G43009 Migraine without aura, not intractable, without status migrainosus: Secondary | ICD-10-CM | POA: Diagnosis not present

## 2018-09-02 DIAGNOSIS — R519 Headache, unspecified: Secondary | ICD-10-CM

## 2018-09-02 DIAGNOSIS — R51 Headache with orthostatic component, not elsewhere classified: Secondary | ICD-10-CM

## 2018-09-02 DIAGNOSIS — H539 Unspecified visual disturbance: Secondary | ICD-10-CM | POA: Diagnosis not present

## 2018-09-02 DIAGNOSIS — G441 Vascular headache, not elsewhere classified: Secondary | ICD-10-CM

## 2018-09-02 NOTE — Progress Notes (Signed)
[facility]    Provider:  Dr Jaynee Eagles Requesting Provider: Lorelei Pont, Gay Filler, MD Primary Care Provider:  [name], MD  CC:  Headache  HPI:  Stephanie Parks is a 73 y.o. female here as requested by Copland, Gay Filler, MD for headaches.  Past medical history of stable right-sided anterior aneurysm, hypertension, bronchitis, chest pain, coughing, dizziness, hypercholesterolemia, hyperlipidemia, migraines.  She is a former smoker half pack a day for 20 years, 10 pack years.  Per emergency room notes, she was seen May 20, she has been frequenting multiple emergency rooms for evaluation but despite testing no specific diagnosis has been given to her.  In the emergency room in May she reported that the symptoms have been ongoing for months if not more, she gets pressure in her head and reports she feels like her left arm gets numb intermittently.  But no weakness or dropping things in the left. She went to the eye doctor yesterday and everything is fine, nothing unusual. She has neck tightness on the right and pain in her neck.   Headaches started 2-3 months. This is different than previous headaches. She feels pressure or like something is running in her head however the sensations of running on her head have stopped. She say Dr. Christella Noa a month ago and everthing is stable. She has pressure on the left side in the temple area. Sometimes in the right temple as well. Also the back of there neck. She has had cardiac evals, CT Angio of the brain, not an MRI. The headaches last 30 minutes to an hour. The last one was 3 days ago. 2-3x a week. She does not wake up with them. The light bothers her, a little lightheaded, nausea, sound sensitivity, can be pounding, 2 tylenols help. She had an emg/ncs last year and it was unremarkable. She has episodic blurry vision with the headaches, painful can be very painful, positional worse with standing she has to lay down.   Reviewed notes, labs  and imaging from outside physicians, which showed:  I reviewed emergency room notes.  Patient was in the emergency room Jul 16, 2018.  She reported pressure in her head that feels like water running down her head with left arm weakness on and off since February.  She has been seen numerous times with no diagnosis. She has been having headaches for months.  She has been going to multiple emergency rooms for evaluation but despite testing no specific diagnosis has been given to her.  She gets brief pressure sensations around her temple on the left.  The last for several seconds at a time and come and go.  She also has some sensation of something running down her head.  No visual symptoms.  No double vision no vision loss.  No nausea or vomiting.  She also feels like her left arm gets numb intermittently this comes and goes.  She does not find that she is dropping things or weak to function.  Both of these symptoms have been several months in duration or more.  On exam she had left temple tenderness however left arm was normal and the rest of her neurologic exam and physical exam were also normal.  She was also seen by my colleague Dr. Jannifer Franklin for leg discomfort in the past.  EMG nerve conduction study was unremarkable.  She was seen for leg pain that is intermittent in nature and may hit one leg or the other lasting only a few seconds  and going away. Review of Systems: Patient complains of symptoms per HPI as well as the following symptoms: joint pain, headache, numbness and tingling, fatigue. Pertinent negatives and positives per HPI. All others negative.   Social History   Socioeconomic History  . Marital status: Divorced    Spouse name: Not on file  . Number of children: 2  . Years of education: Not on file  . Highest education level: Some college, no degree  Occupational History  . Occupation: [employer]  . Financial resource strain: Not on file  . Food insecurity    Worry: Not  on file    Inability: Not on file  . Transportation needs    Medical: Not on file    Non-medical: Not on file  Tobacco Use  . Smoking status: Former Smoker    Packs/day: 0.50    Years: 20.00    Pack years: 10.00    Types: Cigarettes    Quit date: 02/19/1984    Years since quitting: 34.5  . Smokeless tobacco: Never Used  Substance and Sexual Activity  . Alcohol use: No  . Drug use: No  . Sexual activity: Yes    Partners: Male  Lifestyle  . Physical activity    Days per week: Not on file    Minutes per session: Not on file  . Stress: Not on file  Relationships  . Social Herbalist on phone: Not on file    Gets together: Not on file    Attends religious service: Not on file    Active member of club or organization: Not on file    Attends meetings of clubs or organizations: Not on file    Relationship status: Not on file  . Intimate partner violence    Fear of current or ex partner: Not on file    Emotionally abused: Not on file    Physically abused: Not on file    Forced sexual activity: Not on file  Other Topics Concern  . Not on file  Social History Narrative   Lives alone   Caffeine use: Coffee one cup daily   Right handed     Family History  Problem Relation Age of Onset  . Allergic rhinitis Sister   . Diabetes Sister   . Hypertension Sister   . Heart attack Father 66  . Heart disease Father   . Stomach cancer Maternal Grandmother   . Heart disease Mother   . Colon cancer Neg Hx   . Angioedema Neg Hx   . Asthma Neg Hx   . Eczema Neg Hx   . Urticaria Neg Hx   . Immunodeficiency Neg Hx   . Breast cancer Neg Hx   . Migraines Neg Hx   . Headache Neg Hx     Past Medical History:  Diagnosis Date  . 3-vessel CAD 03/01/2015  . Achalasia 09/28/2012  . Allergic rhinitis 03/01/2015  . Aneurysm ([place]) 03/01/2015  . BP (high blood pressure) 03/01/2015  . Bronchitis 04/23/2018  . Cephalalgia 08/24/2012   Overview:  ICD-10 cut over    . Chest pain 04/04/2011   . CN (constipation) 09/28/2012  . Coughing 04/24/2017  . Decreased potassium in the blood 03/01/2015  . Diverticulosis   . Dizziness 03/01/2015  . GERD (gastroesophageal reflux disease)   . Hiatal hernia   . Hypercholesterolemia 03/01/2015  . Hyperlipidemia   . Hypertension   . Ingrown toenail 08/10/2017  . Inguinal hernia   .  Migraine headache   . Onychomycosis 12/08/2017  . Paresthesia of arm 03/01/2015  . Schatzki's ring   . Tendonitis, Achilles, right 12/08/2017  . Unilateral primary osteoarthritis, left knee 10/07/2017    Patient Active Problem List   Diagnosis Date Noted  . History of chest pain 07/20/2018  . History of palpitations 07/20/2018  . Brain aneurysm 07/20/2018  . Bronchitis 04/23/2018  . Tendonitis, Achilles, right 12/08/2017  . Onychomycosis 12/08/2017  . Unilateral primary osteoarthritis, left knee 10/07/2017  . Ingrown toenail 08/10/2017  . Coughing 04/24/2017  . CAD (coronary artery disease) 03/01/2015  . Aneurysm ([place]) 03/01/2015  . Dizziness 03/01/2015  . Hypercholesterolemia 03/01/2015  . Decreased potassium in the blood 03/01/2015  . Paresthesia of arm 03/01/2015  . Allergic rhinitis 03/01/2015  . Achalasia 09/28/2012  . CN (constipation) 09/28/2012  . Cephalalgia 08/24/2012  . HTN (hypertension) 04/19/2011  . Hyperlipidemia 04/19/2011  . GERD (gastroesophageal reflux disease) 04/19/2011  . Chest pain 04/04/2011    Past Surgical History:  Procedure Laterality Date  . ABDOMINAL HYSTERECTOMY    . BACK SURGERY     x 3  . CARPAL TUNNEL RELEASE     left wrist  . CERVICAL SPINE SURGERY    . HEMORRHOID SURGERY      Current Outpatient Medications  Medication Sig Dispense Refill  . alendronate (FOSAMAX) 10 MG tablet TAKE 1 TABLET BY MOUTH DAILY BEFORE BREAKFAST. TAKE WITH A FULL GLASS OF WATER ON EMPTY STOMACH 90 tablet 1  . aspirin 81 MG tablet Take 81 mg by mouth daily.    . Calcium Carbonate (CALCIUM 600 PO) Take 1 tablet by mouth daily.    .  fluticasone (FLONASE) 50 MCG/ACT nasal spray USE 2 SPRAYS IN NOSTRIL(S) DAILY AS NEEDED. 16 g 9  . KLOR-CON M20 20 MEQ tablet TAKE 1 TABLET BY MOUTH EVERY DAY 90 tablet 1  . losartan-hydrochlorothiazide (HYZAAR) 50-12.5 MG tablet Take 1 tablet by mouth daily.    . Multiple Vitamins-Minerals (MULTIVITAL) tablet Take by mouth.    . NONFORMULARY OR COMPOUNDED ITEM Allergen immunotherapy 2 each 1  . pantoprazole (PROTONIX) 40 MG tablet TAKE 1 TABLET (40 MG TOTAL) BY MOUTH DAILY. TAKE 30-60 MINUTES BEFORE BREAKFAST. 90 tablet 0  . rosuvastatin (CRESTOR) 10 MG tablet Take 1 tablet (10 mg total) by mouth daily. 90 tablet 3  . levocetirizine (XYZAL) 5 MG tablet Take 1 tablet (5 mg total) by mouth every evening. (Patient not taking: Reported on 09/02/2018) 30 tablet 5   No current facility-administered medications for this visit.     Allergies as of 09/02/2018 - Review Complete 09/02/2018  Allergen Reaction Noted  . Shellfish allergy Hives and Swelling 12/30/2010  . Ace inhibitors Other (See Comments) 03/01/2015  . Pitavastatin  03/01/2015  . Sulfa antibiotics Other (See Comments) and Rash 11/29/2010    Vitals: BP 132/73 (BP Location: Right Arm, Patient Position: Sitting)   Pulse 63   Temp 97.8 F (36.6 C) Comment: taken by front staff  Ht 5\' 5"  (1.651 m)   Wt 220 lb (99.8 kg)   BMI 36.61 kg/m  Last Weight:  Wt Readings from Last 1 Encounters:  09/02/18 220 lb (99.8 kg)   Last Height:   Ht Readings from Last 1 Encounters:  09/02/18 5\' 5"  (1.651 m)     Physical exam: Exam: Gen: NAD, conversant, obese               CV: RRR, no MRG. No Carotid Bruits. No peripheral edema, warm,  nontender Eyes: Conjunctivae clear without exudates or hemorrhage  Neuro: Detailed Neurologic Exam  Speech:    Speech is normal; fluent and spontaneous with normal comprehension.  Cognition:    The patient is oriented to person, place, and time;     recent and remote memory intact;     language fluent;      normal attention, concentration,     fund of knowledge Cranial Nerves:    The pupils are equal, round, and reactive to light. Attempted fundoscopic exam could not visualize due to small pupils. Visual fields are full to finger confrontation. Extraocular movements are intact. Trigeminal sensation is intact and the muscles of mastication are normal. The face is symmetric. The palate elevates in the midline. Hearing intact. Voice is normal. Shoulder shrug is normal. The tongue has normal motion without fasciculations.   Coordination:    No dysmetria  Gait:    Not ataxic  Motor Observation:    No asymmetry, no atrophy, and no involuntary movements noted. Tone:    Normal muscle tone.    Posture:    Posture is normal.     Strength:    Strength is V/V in the upper and lower limbs. Excellent grip.      Sensation: intact to LT     Reflex Exam:  DTR's:    Deep tendon reflexes trace AJs otherwise in the upper and lower extremities are 1+ bilaterally.   .    Assessment/Plan:  73 y.o. female here as requested by Copland, Gay Filler, MD for headaches.  Past medical history of stable right-sided anterior aneurysm, hypertension, bronchitis, chest pain, coughing, dizziness, hypercholesterolemia, hyperlipidemia, migraines.  She is a former smoker half pack a day for 20 years, 10 pack years.  She has been seen by multiple physicians, specialists and ED without etiology. With the headaches she has light and sound sensitivity, they are unilateral, nausea, pounding, they sound migrainous and she has a history of migraines but need to evaluate for any other causes.   Stable anterior aneurysm that was reimaged by Dr. Christella Noa by CT angios 1 year ago this is remained stable for the past 5 years and has been monitored. Last aw Dr. Christella Noa a month ago. Has also been to eye doctor and primary care and cardiology.  MRI brain w/wo contrast due to concerning symptoms of  positional headaches,vision changes  to  look for space occupying mass, chiari or intracranial hypertension (pseudotumor), csf leak.   If everything is negative we can try migraines treatment. She declines medication at this time.   Orders Placed This Encounter  Procedures  . MR BRAIN W WO CONTRAST  . Sedimentation rate  . C-reactive protein    Cc: Copland, Gay Filler, MD   A total of 40 minutes was spent face-to-face with this patient. Over half this time was spent on counseling patient on the  1. Migraine without aura and without status migrainosus, not intractable   2. Other vascular headache   3. Positional headache   4. Temporal headache   5. Vision changes    diagnosis and different diagnostic and therapeutic options, counseling and coordination of care, risks ans benefits of management, compliance, or risk factor reduction and education.     Sarina Ill, MD  [facility] [address] [place], [place] [address]  Phone [phone] Fax [phone]

## 2018-09-02 NOTE — Telephone Encounter (Signed)
[employer] medicare order sent to GI. No auth they will reach out to the patient to schedule.  

## 2018-09-02 NOTE — Patient Instructions (Signed)
Blood work for inflammation MRI of the brain w/wo contrast Can follow up if you would like to discuss migraine management   Migraine Headache A migraine headache is an intense, throbbing pain on one side or both sides of the head. Migraine headaches may also cause other symptoms, such as nausea, vomiting, and sensitivity to light and noise. A migraine headache can last from 4 hours to 3 days. Talk with your doctor about what things may bring on (trigger) your migraine headaches. What are the causes? The exact cause of this condition is not known. However, a migraine may be caused when nerves in the brain become irritated and release chemicals that cause inflammation of blood vessels. This inflammation causes pain. This condition may be triggered or caused by:  Drinking alcohol.  Smoking.  Taking medicines, such as: ? Medicine used to treat chest pain (nitroglycerin). ? Birth control pills. ? Estrogen. ? Certain blood pressure medicines.  Eating or drinking products that contain nitrates, glutamate, aspartame, or tyramine. Aged cheeses, chocolate, or caffeine may also be triggers.  Doing physical activity. Other things that may trigger a migraine headache include:  Menstruation.  Pregnancy.  Hunger.  Stress.  Lack of sleep or too much sleep.  Weather changes.  Fatigue. What increases the risk? The following factors may make you more likely to experience migraine headaches:  Being a certain age. This condition is more common in people who are 60-9 years old.  Being female.  Having a family history of migraine headaches.  Being Caucasian.  Having a mental health condition, such as depression or anxiety.  Being obese. What are the signs or symptoms? The main symptom of this condition is pulsating or throbbing pain. This pain may:  Happen in any area of the head, such as on one side or both sides.  Interfere with daily activities.  Get worse with physical  activity.  Get worse with exposure to bright lights or loud noises. Other symptoms may include:  Nausea.  Vomiting.  Dizziness.  General sensitivity to bright lights, loud noises, or smells. Before you get a migraine headache, you may get warning signs (an aura). An aura may include:  Seeing flashing lights or having blind spots.  Seeing bright spots, halos, or zigzag lines.  Having tunnel vision or blurred vision.  Having numbness or a tingling feeling.  Having trouble talking.  Having muscle weakness. Some people have symptoms after a migraine headache (postdromal phase), such as:  Feeling tired.  Difficulty concentrating. How is this diagnosed? A migraine headache can be diagnosed based on:  Your symptoms.  A physical exam.  Tests, such as: ? CT scan or an MRI of the head. These imaging tests can help rule out other causes of headaches. ? Taking fluid from the spine (lumbar puncture) and analyzing it (cerebrospinal fluid analysis, or CSF analysis). How is this treated? This condition may be treated with medicines that:  Relieve pain.  Relieve nausea.  Prevent migraine headaches. Treatment for this condition may also include:  Acupuncture.  Lifestyle changes like avoiding foods that trigger migraine headaches.  Biofeedback.  Cognitive behavioral therapy. Follow these instructions at home: Medicines  Take over-the-counter and prescription medicines only as told by your health care provider.  Ask your health care provider if the medicine prescribed to you: ? Requires you to avoid driving or using heavy machinery. ? Can cause constipation. You may need to take these actions to prevent or treat constipation:  Drink enough fluid to keep your  urine pale yellow.  Take over-the-counter or prescription medicines.  Eat foods that are high in fiber, such as beans, whole grains, and fresh fruits and vegetables.  Limit foods that are high in fat and  processed sugars, such as fried or sweet foods. Lifestyle  Do not drink alcohol.  Do not use any products that contain nicotine or tobacco, such as cigarettes, e-cigarettes, and chewing tobacco. If you need help quitting, ask your health care provider.  Get at least 8 hours of sleep every night.  Find ways to manage stress, such as meditation, deep breathing, or yoga. General instructions      Keep a journal to find out what may trigger your migraine headaches. For example, write down: ? What you eat and drink. ? How much sleep you get. ? Any change to your diet or medicines.  If you have a migraine headache: ? Avoid things that make your symptoms worse, such as bright lights. ? It may help to lie down in a dark, quiet room. ? Do not drive or use heavy machinery. ? Ask your health care provider what activities are safe for you while you are experiencing symptoms.  Keep all follow-up visits as told by your health care provider. This is important. Contact a health care provider if:  You develop symptoms that are different or more severe than your usual migraine headache symptoms.  You have more than 15 headache days in one month. Get help right away if:  Your migraine headache becomes severe.  Your migraine headache lasts longer than 72 hours.  You have a fever.  You have a stiff neck.  You have vision loss.  Your muscles feel weak or like you cannot control them.  You start to lose your balance often.  You have trouble walking.  You faint.  You have a seizure. Summary  A migraine headache is an intense, throbbing pain on one side or both sides of the head. Migraines may also cause other symptoms, such as nausea, vomiting, and sensitivity to light and noise.  This condition may be treated with medicines and lifestyle changes. You may also need to avoid certain things that trigger a migraine headache.  Keep a journal to find out what may trigger your migraine  headaches.  Contact your health care provider if you have more than 15 headache days in a month or you develop symptoms that are different or more severe than your usual migraine headache symptoms. This information is not intended to replace advice given to you by your health care provider. Make sure you discuss any questions you have with your health care provider. Document Released: 02/04/2005 Document Revised: 05/29/2018 Document Reviewed: 03/19/2018 Elsevier Patient Education  2020 [employer].

## 2018-09-03 LAB — SEDIMENTATION RATE: Sed Rate: 25 mm/hr (ref 0–40)

## 2018-09-03 LAB — C-REACTIVE PROTEIN: CRP: 2 mg/L (ref 0–10)

## 2018-09-09 ENCOUNTER — Telehealth: Payer: Self-pay | Admitting: *Deleted

## 2018-09-09 NOTE — Telephone Encounter (Signed)
-----   Message from Melvenia Beam, MD sent at 09/03/2018 10:05 AM EDT ----- Labs normal, great news no inflammation in the body or blood vessels per tests

## 2018-09-09 NOTE — Telephone Encounter (Signed)
I called the pt & LVM (ok per DPR) advising her labs came back normal. Dr. Jaynee Eagles says this is great news. There's no inflammation in the body or blood vessels per the tests that we did. Advised call back not required, but if any questions call [phone].

## 2018-09-18 ENCOUNTER — Telehealth: Payer: Self-pay | Admitting: *Deleted

## 2018-09-18 NOTE — Telephone Encounter (Signed)
Called to ask patient about her symptoms because she may need to be seen sooner than Monday.  Left message on machine that if she develops any chest pain, SOB, or she gets worse over the weekend then head to ED.

## 2018-09-19 ENCOUNTER — Other Ambulatory Visit: Payer: Self-pay

## 2018-09-19 ENCOUNTER — Encounter (HOSPITAL_BASED_OUTPATIENT_CLINIC_OR_DEPARTMENT_OTHER): Payer: Self-pay | Admitting: Emergency Medicine

## 2018-09-19 ENCOUNTER — Emergency Department (HOSPITAL_BASED_OUTPATIENT_CLINIC_OR_DEPARTMENT_OTHER): Payer: Medicare Other

## 2018-09-19 ENCOUNTER — Emergency Department (HOSPITAL_BASED_OUTPATIENT_CLINIC_OR_DEPARTMENT_OTHER)
Admission: EM | Admit: 2018-09-19 | Discharge: 2018-09-19 | Disposition: A | Discharge status: Home / Self Care | Payer: Medicare Other | Attending: Emergency Medicine | Admitting: Emergency Medicine

## 2018-09-19 DIAGNOSIS — I10 Essential (primary) hypertension: Secondary | ICD-10-CM | POA: Diagnosis not present

## 2018-09-19 DIAGNOSIS — I2510 Atherosclerotic heart disease of native coronary artery without angina pectoris: Secondary | ICD-10-CM | POA: Insufficient documentation

## 2018-09-19 DIAGNOSIS — Z7982 Long term (current) use of aspirin: Secondary | ICD-10-CM | POA: Insufficient documentation

## 2018-09-19 DIAGNOSIS — E876 Hypokalemia: Secondary | ICD-10-CM | POA: Diagnosis not present

## 2018-09-19 DIAGNOSIS — Z79899 Other long term (current) drug therapy: Secondary | ICD-10-CM | POA: Diagnosis not present

## 2018-09-19 DIAGNOSIS — R079 Chest pain, unspecified: Secondary | ICD-10-CM | POA: Diagnosis not present

## 2018-09-19 DIAGNOSIS — Z87891 Personal history of nicotine dependence: Secondary | ICD-10-CM | POA: Diagnosis not present

## 2018-09-19 LAB — CBC WITH DIFFERENTIAL/PLATELET
Abs Immature Granulocytes: 0.01 10*3/uL (ref 0.00–0.07)
Basophils Absolute: 0 10*3/uL (ref 0.0–0.1)
Basophils Relative: 0 %
Eosinophils Absolute: 0.3 10*3/uL (ref 0.0–0.5)
Eosinophils Relative: 4 %
HCT: 36 % (ref 36.0–46.0)
Hemoglobin: 11.4 g/dL — ABNORMAL LOW (ref 12.0–15.0)
Immature Granulocytes: 0 %
Lymphocytes Relative: 35 %
Lymphs Abs: 2.4 10*3/uL (ref 0.7–4.0)
MCH: 28.6 pg (ref 26.0–34.0)
MCHC: 31.7 g/dL (ref 30.0–36.0)
MCV: 90.2 fL (ref 80.0–100.0)
Monocytes Absolute: 0.5 10*3/uL (ref 0.1–1.0)
Monocytes Relative: 7 %
Neutro Abs: 3.7 10*3/uL (ref 1.7–7.7)
Neutrophils Relative %: 54 %
Platelets: 303 10*3/uL (ref 150–400)
RBC: 3.99 MIL/uL (ref 3.87–5.11)
RDW: 14 % (ref 11.5–15.5)
WBC: 6.9 10*3/uL (ref 4.0–10.5)
nRBC: 0 % (ref 0.0–0.2)

## 2018-09-19 LAB — COMPREHENSIVE METABOLIC PANEL
ALT: 18 U/L (ref 0–44)
AST: 22 U/L (ref 15–41)
Albumin: 3.7 g/dL (ref 3.5–5.0)
Alkaline Phosphatase: 53 U/L (ref 38–126)
Anion gap: 10 (ref 5–15)
BUN: 10 mg/dL (ref 8–23)
CO2: 30 mmol/L (ref 22–32)
Calcium: 9.2 mg/dL (ref 8.9–10.3)
Chloride: 100 mmol/L (ref 98–111)
Creatinine, Ser: 0.7 mg/dL (ref 0.44–1.00)
GFR calc Af Amer: >60 mL/min (ref >60)
GFR calc non Af Amer: >60 mL/min (ref >60)
Glucose, Bld: 97 mg/dL (ref 70–99)
Potassium: 2.9 mmol/L — ABNORMAL LOW (ref 3.5–5.1)
Sodium: 140 mmol/L (ref 135–145)
Total Bilirubin: 0.5 mg/dL (ref 0.3–1.2)
Total Protein: 7.1 g/dL (ref 6.5–8.1)

## 2018-09-19 LAB — TROPONIN I (HIGH SENSITIVITY)
Troponin I (High Sensitivity): 5 ng/L (ref <18)
Troponin I (High Sensitivity): 6 ng/L (ref <18)

## 2018-09-19 MED ORDER — HYDROCHLOROTHIAZIDE 25 MG PO TABS
25.0000 mg | ORAL_TABLET | Freq: Once | ORAL | Status: AC
  Administered 2018-09-19: 15:00:00 25 mg via ORAL
  Filled 2018-09-19: qty 1

## 2018-09-19 MED ORDER — POTASSIUM CHLORIDE CRYS ER 20 MEQ PO TBCR
40.0000 meq | EXTENDED_RELEASE_TABLET | Freq: Once | ORAL | Status: AC
  Administered 2018-09-19: 40 meq via ORAL
  Filled 2018-09-19: qty 2

## 2018-09-19 NOTE — ED Provider Notes (Signed)
Care transferred to me.  Troponins are negative x2.  BMP shows potassium of 2.9.  She is chronically on 20 mEq/day, I will give her a dose now and have her increase this to 20 twice daily.  She has plenty to do this at home per her.  Otherwise her chest pain is very atypical.  Perhaps her overall feeling poorly could be from potassium.  She is due to see Dr. Edilia Bo in 2 days and I advised her to keep this appointment and they can adjust blood pressure there.   Sherwood Gambler, MD 09/19/18 1754

## 2018-09-19 NOTE — ED Provider Notes (Signed)
[facility] EMERGENCY DEPARTMENT Provider Note   CSN: [id number] Arrival date & time: 09/19/18  1246    History   Chief Complaint No chief complaint on file.   HPI Stephanie Parks is a 73 y.o. female.     HPI   Presents with concern for hypertension New lip trembling, upper lip trembling on right side, noticed it yesterday, then it went away and today it started again. Comes and goes. Not happening at this moment. Lasts a few seconds  Normally BP medications control her blood pressure, took it today but BP high today 153/78 Nausea and lightheadedness No headache today, just aching around neck Is having left sided pain today  Has had chronic left sided weakness for greater than one month. Began having pain left sided breast pain, does not think it is pain in chest, pain in breast. Feels like back pain radiating from back through chest to breast.  Has been off and on for greater than one month but more than usual.  Not worse with exertion, will happen spontaneously, Doesn't notice it when working, moving. Mild shortness of breath, Will do a lot of belching after the pain. On acid reflux medication.  Having some dyspnea but not assocaited with pain.   No vomiting 1 mont ago saw Dr. Irish Lack, coronary CT with Coronary artery calcium score of 0. Was having symptoms prior to this.  Actually reports pain since April-May, and headache and weakness also since end of May or June   Scheduled to have breast mammogram Sept 3   Past Medical History:  Diagnosis Date  . 3-vessel CAD 03/01/2015  . Achalasia 09/28/2012  . Allergic rhinitis 03/01/2015  . Aneurysm ([place]) 03/01/2015  . BP (high blood pressure) 03/01/2015  . Bronchitis 04/23/2018  . Cephalalgia 08/24/2012   Overview:  ICD-10 cut over    . Chest pain 04/04/2011  . CN (constipation) 09/28/2012  . Coughing 04/24/2017  . Decreased potassium in the blood 03/01/2015  . Diverticulosis   . Dizziness 03/01/2015  . GERD  (gastroesophageal reflux disease)   . Hiatal hernia   . Hypercholesterolemia 03/01/2015  . Hyperlipidemia   . Hypertension   . Ingrown toenail 08/10/2017  . Inguinal hernia   . Migraine headache   . Onychomycosis 12/08/2017  . Paresthesia of arm 03/01/2015  . Schatzki's ring   . Tendonitis, Achilles, right 12/08/2017  . Unilateral primary osteoarthritis, left knee 10/07/2017    Patient Active Problem List   Diagnosis Date Noted  . History of chest pain 07/20/2018  . History of palpitations 07/20/2018  . Brain aneurysm 07/20/2018  . Bronchitis 04/23/2018  . Tendonitis, Achilles, right 12/08/2017  . Onychomycosis 12/08/2017  . Unilateral primary osteoarthritis, left knee 10/07/2017  . Ingrown toenail 08/10/2017  . Coughing 04/24/2017  . CAD (coronary artery disease) 03/01/2015  . Aneurysm ([place]) 03/01/2015  . Dizziness 03/01/2015  . Hypercholesterolemia 03/01/2015  . Decreased potassium in the blood 03/01/2015  . Paresthesia of arm 03/01/2015  . Allergic rhinitis 03/01/2015  . Achalasia 09/28/2012  . CN (constipation) 09/28/2012  . Cephalalgia 08/24/2012  . HTN (hypertension) 04/19/2011  . Hyperlipidemia 04/19/2011  . GERD (gastroesophageal reflux disease) 04/19/2011  . Chest pain 04/04/2011    Past Surgical History:  Procedure Laterality Date  . ABDOMINAL HYSTERECTOMY    . BACK SURGERY     x 3  . CARPAL TUNNEL RELEASE     left wrist  . CERVICAL SPINE SURGERY    . HEMORRHOID SURGERY  OB History   No obstetric history on file.      Home Medications    Prior to Admission medications   Medication Sig Start Date End Date Taking? Authorizing Provider  alendronate (FOSAMAX) 10 MG tablet TAKE 1 TABLET BY MOUTH DAILY BEFORE BREAKFAST. TAKE WITH A FULL GLASS OF WATER ON EMPTY STOMACH 03/17/18   Copland, Gay Filler, MD  aspirin 81 MG tablet Take 81 mg by mouth daily.    [provider]  Calcium Carbonate (CALCIUM 600 PO) Take 1 tablet by mouth daily.     [provider]  fluticasone (FLONASE) 50 MCG/ACT nasal spray USE 2 SPRAYS IN NOSTRIL(S) DAILY AS NEEDED. 01/14/18   Copland, Gay Filler, MD  KLOR-CON M20 20 MEQ tablet TAKE 1 TABLET BY MOUTH EVERY DAY 08/11/18   Copland, Gay Filler, MD  levocetirizine (XYZAL) 5 MG tablet Take 1 tablet (5 mg total) by mouth every evening. Patient not taking: Reported on 09/02/2018 07/20/18   Carollee Herter, Alferd Apa, DO  losartan-hydrochlorothiazide (HYZAAR) 50-12.5 MG tablet Take 1 tablet by mouth daily.    [provider]  Multiple Vitamins-Minerals (MULTIVITAL) tablet Take by mouth.    [provider]  NONFORMULARY OR COMPOUNDED ITEM Allergen immunotherapy 03/01/15   Leda Roys, MD  pantoprazole (PROTONIX) 40 MG tablet TAKE 1 TABLET (40 MG TOTAL) BY MOUTH DAILY. TAKE 30-60 MINUTES BEFORE BREAKFAST. 07/14/18   Pyrtle, Lajuan Lines, MD  rosuvastatin (CRESTOR) 10 MG tablet Take 1 tablet (10 mg total) by mouth daily. 05/28/18   Jettie Booze, MD    Family History Family History  Problem Relation Age of Onset  . Allergic rhinitis Sister   . Diabetes Sister   . Hypertension Sister   . Heart attack Father 75  . Heart disease Father   . Stomach cancer Maternal Grandmother   . Heart disease Mother   . Colon cancer Neg Hx   . Angioedema Neg Hx   . Asthma Neg Hx   . Eczema Neg Hx   . Urticaria Neg Hx   . Immunodeficiency Neg Hx   . Breast cancer Neg Hx   . Migraines Neg Hx   . Headache Neg Hx     Social History Social History   Tobacco Use  . Smoking status: Former Smoker    Packs/day: 0.50    Years: 20.00    Pack years: 10.00    Types: Cigarettes    Quit date: 02/19/1984    Years since quitting: 34.6  . Smokeless tobacco: Never Used  Substance Use Topics  . Alcohol use: No  . Drug use: No     Allergies   Shellfish allergy, Ace inhibitors, Pitavastatin, and Sulfa antibiotics   Review of Systems Review of Systems  Constitutional: Negative for fever.  Eyes: Negative for  visual disturbance.  Respiratory: Positive for shortness of breath. Negative for cough.   Cardiovascular: Positive for chest pain.  Gastrointestinal: Positive for nausea. Negative for abdominal pain and vomiting.  Genitourinary: Negative for dysuria.  Musculoskeletal: Positive for arthralgias, back pain and myalgias.  Skin: Negative for rash.  Neurological: Positive for weakness. Negative for speech difficulty, numbness and headaches.     Physical Exam Updated Vital Signs BP (!) 151/69   Pulse (!) 55   Temp 98.5 F (36.9 C) (Oral)   Resp 13   SpO2 95%   Physical Exam Vitals signs and nursing note reviewed.  Constitutional:      General: She is not in acute distress.  Appearance: She is well-developed. She is not diaphoretic.  HENT:     Head: Normocephalic and atraumatic.  Eyes:     Conjunctiva/sclera: Conjunctivae normal.  Neck:     Musculoskeletal: Normal range of motion.  Cardiovascular:     Rate and Rhythm: Normal rate and regular rhythm.     Heart sounds: Normal heart sounds. No murmur. No friction rub. No gallop.   Pulmonary:     Effort: Pulmonary effort is normal. No respiratory distress.     Breath sounds: Normal breath sounds. No wheezing or rales.  Chest:     Chest wall: Tenderness present.  Abdominal:     General: There is no distension.     Palpations: Abdomen is soft.     Tenderness: There is no abdominal tenderness. There is no guarding.  Musculoskeletal:        General: No tenderness.  Skin:    General: Skin is warm and dry.     Findings: No erythema or rash.  Neurological:     Mental Status: She is alert and oriented to person, place, and time.      ED Treatments / Results  Labs (all labs ordered are listed, but only abnormal results are displayed) Labs Reviewed  CBC WITH DIFFERENTIAL/PLATELET - Abnormal; Notable for the following components:      Result Value   Hemoglobin 11.4 (*)    All other components within normal limits   COMPREHENSIVE METABOLIC PANEL - Abnormal; Notable for the following components:   Potassium 2.9 (*)    All other components within normal limits  TROPONIN I (HIGH SENSITIVITY)  TROPONIN I (HIGH SENSITIVITY)    EKG EKG Interpretation  Date/Time:  Saturday September 19 2018 13:00:17 EDT Ventricular Rate:  66 PR Interval:    QRS Duration: 107 QT Interval:  405 QTC Calculation: 425 R Axis:   41 Text Interpretation:  Sinus rhythm Low voltage, precordial leads Confirmed by Sherwood Gambler [phone]) on 09/19/2018 4:44:09 PM   Radiology Dg Chest 2 View  Result Date: 09/19/2018 CLINICAL DATA:  Left chest pain for several months EXAM: CHEST - 2 VIEW COMPARISON:  April 23, 2018 FINDINGS: The heart size and mediastinal contours are stable. The aorta is tortuous. The heart size is upper limits of normal. Both lungs are clear. The visualized skeletal structures are stable. IMPRESSION: No active cardiopulmonary disease. Electronically Signed   By: Abelardo Diesel M.D.   On: 09/19/2018 15:09    Procedures Procedures (including critical care time)  Medications Ordered in ED Medications  hydrochlorothiazide (HYDRODIURIL) tablet 25 mg (25 mg Oral Given 09/19/18 1440)  potassium chloride SA (K-DUR) CR tablet 40 mEq (40 mEq Oral Given 09/19/18 1521)     Initial Impression / Assessment and Plan / ED Course  I have reviewed the triage vital signs and the nursing notes.  Pertinent labs & imaging results that were available during my care of the patient were reviewed by me and considered in my medical decision making (see chart for details).        73yo female with history above presents with concern for hypertension with episodes of lip trembling, also reports continuing of chronic left sided pain and weakness.  Given no acute headache today, no new neurologic symptoms, do not feel emergent head imaging indicated. Is scheduled for MRI 8/10. Doubt acute CVA or intracranial bleed today.  Given CP, ordered  Troponins. Doubt PE, dissection given duration of symptoms, hx and exam.  Labs including electrolytes pending.  If troponin  negative, pt may follow up with Cardiology. Had low calcium score on coronary CT recently after symptoms had started.  Signed out to Dr. Regenia Skeeter with labs pending.   Final Clinical Impressions(s) / ED Diagnoses   Final diagnoses:  Essential hypertension  Hypokalemia    ED Discharge Orders    None       Gareth Morgan, MD 09/20/18 2353

## 2018-09-19 NOTE — ED Triage Notes (Addendum)
"   My BP was 153/78 x 1 hr ago and I have been having problems with my left side of my body" Has  had "CTs and a lot of test" unknown cause of weakness to left side. Is scheduled for MRI on 8/10 . No new concerns, states," I know something is wrong with me"

## 2018-09-19 NOTE — Discharge Instructions (Signed)
If you develop recurrent, continued, or worsening chest pain, shortness of breath, fever, vomiting, abdominal or back pain, or any other new/concerning symptoms then return to the ER for evaluation.   Increase your potassium from 1 tablet/day to 1 tablet in the morning and 1 tablet in the evening. Follow up with Dr. Lorelei Pont.

## 2018-09-20 NOTE — Progress Notes (Addendum)
[facility] at [facility] [address], [place], [place] [address] [phone]  Date:  09/21/2018   Name:  Stephanie Parks   DOB:  [birth year]   MRN:  [id number]  PCP:  [name], MD    Chief Complaint: Arm Pain (left side, went to ER saturday)   History of Present Illness:  Stephanie Parks is a 73 y.o. very pleasant female patient who presents with the following:  Visit today for concern of left side pain. She notes that she has had left sided pain "for months, nobody seems to know what is going on"  She has had pain in the left arm, leg and chest She was seen in the emergency room on August 1, at which point she had a chest pain rule out She was noted to have hypokalemia to 2.9 at that time  Her blood pressure is treated with losartan/HCTZ 50/12.5, and she was taking potassium 20 meq- they had her increase to twice a day due to low K  Mammogram is scheduled for next month- screening.  However she notes that she is having some left breast pain so will change to diagnosti  She notes that today she feels generally ok, but her appetite has been decreased for a couple of days  She notes that she has had left side pain- leg and arm- for about 5 moths now.  She has seen neurology and NSG, and has been to the ER twice for CP rule out   She is getting an MRI of her brain next week per neurology as part of her evaluation  She is getting occasional HA  No fever, occasional chills related to her hot flashes   She does an exercise video 3x a week- no problems with CP or SOB then She may get some left arm pain when she is working at [employer]; she works at [employer] and is constantly moving items with her arms - this may worsen her UE pain   BP Readings from Last 3 Encounters:  09/21/18 126/72  09/19/18 (!) 151/69  09/02/18 132/73   Wt Readings from Last 3 Encounters:  09/21/18 214 lb (97.1 kg)  09/02/18 220 lb (99.8 kg)   07/21/18 220 lb (99.8 kg)    Patient Active Problem List   Diagnosis Date Noted  . History of chest pain 07/20/2018  . History of palpitations 07/20/2018  . Brain aneurysm 07/20/2018  . Bronchitis 04/23/2018  . Tendonitis, Achilles, right 12/08/2017  . Onychomycosis 12/08/2017  . Unilateral primary osteoarthritis, left knee 10/07/2017  . Ingrown toenail 08/10/2017  . Coughing 04/24/2017  . CAD (coronary artery disease) 03/01/2015  . Aneurysm ([place]) 03/01/2015  . Dizziness 03/01/2015  . Hypercholesterolemia 03/01/2015  . Decreased potassium in the blood 03/01/2015  . Paresthesia of arm 03/01/2015  . Allergic rhinitis 03/01/2015  . Achalasia 09/28/2012  . CN (constipation) 09/28/2012  . Cephalalgia 08/24/2012  . HTN (hypertension) 04/19/2011  . Hyperlipidemia 04/19/2011  . GERD (gastroesophageal reflux disease) 04/19/2011  . Chest pain 04/04/2011    Past Medical History:  Diagnosis Date  . 3-vessel CAD 03/01/2015  . Achalasia 09/28/2012  . Allergic rhinitis 03/01/2015  . Aneurysm ([place]) 03/01/2015  . BP (high blood pressure) 03/01/2015  . Bronchitis 04/23/2018  . Cephalalgia 08/24/2012   Overview:  ICD-10 cut over    . Chest pain 04/04/2011  . CN (constipation) 09/28/2012  . Coughing 04/24/2017  . Decreased potassium  in the blood 03/01/2015  . Diverticulosis   . Dizziness 03/01/2015  . GERD (gastroesophageal reflux disease)   . Hiatal hernia   . Hypercholesterolemia 03/01/2015  . Hyperlipidemia   . Hypertension   . Ingrown toenail 08/10/2017  . Inguinal hernia   . Migraine headache   . Onychomycosis 12/08/2017  . Paresthesia of arm 03/01/2015  . Schatzki's ring   . Tendonitis, Achilles, right 12/08/2017  . Unilateral primary osteoarthritis, left knee 10/07/2017    Past Surgical History:  Procedure Laterality Date  . ABDOMINAL HYSTERECTOMY    . BACK SURGERY     x 3  . CARPAL TUNNEL RELEASE     left wrist  . CERVICAL SPINE SURGERY    . HEMORRHOID SURGERY      Social  History   Tobacco Use  . Smoking status: Former Smoker    Packs/day: 0.50    Years: 20.00    Pack years: 10.00    Types: Cigarettes    Quit date: 02/19/1984    Years since quitting: 34.6  . Smokeless tobacco: Never Used  Substance Use Topics  . Alcohol use: No  . Drug use: No    Family History  Problem Relation Age of Onset  . Allergic rhinitis Sister   . Diabetes Sister   . Hypertension Sister   . Heart attack Father 12  . Heart disease Father   . Stomach cancer Maternal Grandmother   . Heart disease Mother   . Colon cancer Neg Hx   . Angioedema Neg Hx   . Asthma Neg Hx   . Eczema Neg Hx   . Urticaria Neg Hx   . Immunodeficiency Neg Hx   . Breast cancer Neg Hx   . Migraines Neg Hx   . Headache Neg Hx     Allergies  Allergen Reactions  . Shellfish Allergy Hives and Swelling  . Ace Inhibitors Other (See Comments)  . Pitavastatin   . Sulfa Antibiotics Other (See Comments) and Rash    Fine bumps    Medication list has been reviewed and updated.  Current Outpatient Medications on File Prior to Visit  Medication Sig Dispense Refill  . alendronate (FOSAMAX) 10 MG tablet TAKE 1 TABLET BY MOUTH DAILY BEFORE BREAKFAST. TAKE WITH A FULL GLASS OF WATER ON EMPTY STOMACH 90 tablet 1  . aspirin 81 MG tablet Take 81 mg by mouth daily.    . Calcium Carbonate (CALCIUM 600 PO) Take 1 tablet by mouth daily.    . fluticasone (FLONASE) 50 MCG/ACT nasal spray USE 2 SPRAYS IN NOSTRIL(S) DAILY AS NEEDED. 16 g 9  . KLOR-CON M20 20 MEQ tablet TAKE 1 TABLET BY MOUTH EVERY DAY 90 tablet 1  . levocetirizine (XYZAL) 5 MG tablet Take 1 tablet (5 mg total) by mouth every evening. 30 tablet 5  . losartan-hydrochlorothiazide (HYZAAR) 50-12.5 MG tablet Take 1 tablet by mouth daily.    . Multiple Vitamins-Minerals (MULTIVITAL) tablet Take by mouth.    . NONFORMULARY OR COMPOUNDED ITEM Allergen immunotherapy 2 each 1  . pantoprazole (PROTONIX) 40 MG tablet TAKE 1 TABLET (40 MG TOTAL) BY MOUTH  DAILY. TAKE 30-60 MINUTES BEFORE BREAKFAST. 90 tablet 0  . rosuvastatin (CRESTOR) 10 MG tablet Take 1 tablet (10 mg total) by mouth daily. 90 tablet 3  . senna (SENOKOT) 8.6 MG tablet Take 1 tablet by mouth 2 (two) times a day.     No current facility-administered medications on file prior to visit.     Review  of Systems:  As per HPI- otherwise negative. She does take laxatives some-this could be why her K was low   Physical Examination: Vitals:   09/21/18 0858  BP: 126/72  Pulse: 72  Resp: 16  Temp: (!) 97.3 F (36.3 C)  SpO2: 97%   Vitals:   09/21/18 0858  Weight: 214 lb (97.1 kg)  Height: 5\' 5"  (1.651 m)   Body mass index is 35.61 kg/m. Ideal Body Weight: Weight in (lb) to have BMI = 25: 149.9  GEN: WDWN, NAD, Non-toxic, A & O x 3, obese, looks well  HEENT: Atraumatic, Normocephalic. Neck supple. No masses, No LAD. Ears and Nose: No external deformity. CV: RRR, No M/G/R. No JVD. No thrill. No extra heart sounds. PULM: CTA B, no wheezes, crackles, rhonchi. No retractions. No resp. distress. No accessory muscle use. ABD: S, NT, ND, +BS. No rebound. No HSM. EXTR: No c/c/e NEURO Normal gait.  PSYCH: Normally interactive. Conversant. Not depressed or anxious appearing.  Calm demeanor.  Breast: normal exam bilaterally, no masses/ dimpling/ discharge however she does have tenderness in the left chest muscles     Assessment and Plan:   ICD-10-CM   1. Hypokalemia  L97.6 Basic metabolic panel  2. Breast pain, left  N64.4 MM Digital Diagnostic Bilat    US BREAST LTD UNI LEFT INC AXILLA    CANCELED: US BREAST COMPLETE UNI LEFT INC AXILLA  3. Hyperlipidemia, unspecified hyperlipidemia type  E78.5   4. Essential hypertension  I10   5. Hospital discharge follow-up  Z09    Following up from recent ER visit for CP- thought to be non cardiac in origin Her potassium supplement was doubled  Check K level today- I will contact her with further instructions  Her BP looks fine  today- continue current medication  Her left chest muscles are sore.  This does not explain her several months of various left sided pains, but may be why she had this more acute pain.  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 See patient instructions for more details.      Follow-up: No follow-ups on file.  No orders of the defined types were placed in this encounter.  Orders Placed This Encounter  Procedures  . MM Digital Diagnostic Bilat  . US BREAST LTD UNI LEFT INC AXILLA  . Basic metabolic panel    @SIGN @    Signed Lamar Blinks, MD  Received her labs- gave her a call Left detailed message - continue double dose of K through Wednesday, then back to normal Let's recheck K in 2-3 weeks- order now   Called her on 8/5- she did get my message and is well Results for orders placed or performed in visit on 73/41/93  Basic metabolic panel  Result Value Ref Range   Sodium 142 135 - 145 mEq/L   Potassium 3.3 (L) 3.5 - 5.1 mEq/L   Chloride 102 96 - 112 mEq/L   CO2 32 19 - 32 mEq/L   Glucose, Bld 98 70 - 99 mg/dL   BUN 11 6 - 23 mg/dL   Creatinine, Ser 0.79 0.40 - 1.20 mg/dL   Calcium 9.6 8.4 - 10.5 mg/dL   GFR 86.33 >60.00 mL/min

## 2018-09-21 ENCOUNTER — Other Ambulatory Visit: Payer: Self-pay

## 2018-09-21 ENCOUNTER — Ambulatory Visit (INDEPENDENT_AMBULATORY_CARE_PROVIDER_SITE_OTHER): Payer: Medicare Other | Admitting: Family Medicine

## 2018-09-21 ENCOUNTER — Encounter: Payer: Self-pay | Admitting: Family Medicine

## 2018-09-21 VITALS — BP 126/72 | HR 72 | Temp 97.3°F | Resp 16 | Ht 65.0 in | Wt 214.0 lb

## 2018-09-21 DIAGNOSIS — E876 Hypokalemia: Secondary | ICD-10-CM

## 2018-09-21 DIAGNOSIS — E785 Hyperlipidemia, unspecified: Secondary | ICD-10-CM | POA: Diagnosis not present

## 2018-09-21 DIAGNOSIS — N644 Mastodynia: Secondary | ICD-10-CM | POA: Diagnosis not present

## 2018-09-21 DIAGNOSIS — I10 Essential (primary) hypertension: Secondary | ICD-10-CM | POA: Diagnosis not present

## 2018-09-21 DIAGNOSIS — Z09 Encounter for follow-up examination after completed treatment for conditions other than malignant neoplasm: Secondary | ICD-10-CM

## 2018-09-21 LAB — BASIC METABOLIC PANEL
BUN: 11 mg/dL (ref 6–23)
CO2: 32 mEq/L (ref 19–32)
Calcium: 9.6 mg/dL (ref 8.4–10.5)
Chloride: 102 mEq/L (ref 96–112)
Creatinine, Ser: 0.79 mg/dL (ref 0.40–1.20)
GFR: 86.33 mL/min (ref >60.00)
Glucose, Bld: 98 mg/dL (ref 70–99)
Potassium: 3.3 mEq/L — ABNORMAL LOW (ref 3.5–5.1)
Sodium: 142 mEq/L (ref 135–145)

## 2018-09-21 NOTE — Patient Instructions (Signed)
It was good to see you today- I will be in touch with your labs asap.  We are going to check your potassium level today to see where you are  BP looks fine today- please continue current BP meds I am going to set you up for a "diagnostic" mammogram to evaluate your left breast pain. This will be done at the [facility]; let me know if they don't contact you soon about your appt  Please keep me posted as to how you are feeling

## 2018-09-21 NOTE — Addendum Note (Signed)
Addended by: Lamar Blinks C on: 09/21/2018 06:11 PM   Modules accepted: Orders

## 2018-09-28 ENCOUNTER — Ambulatory Visit
Admission: RE | Admit: 2018-09-28 | Discharge: 2018-09-28 | Disposition: A | Discharge status: Home / Self Care | Payer: Medicare Other | Source: Ambulatory Visit | Attending: Neurology | Admitting: Neurology

## 2018-09-28 ENCOUNTER — Other Ambulatory Visit: Payer: Self-pay

## 2018-09-28 DIAGNOSIS — H539 Unspecified visual disturbance: Secondary | ICD-10-CM

## 2018-09-28 DIAGNOSIS — R519 Headache, unspecified: Secondary | ICD-10-CM

## 2018-09-28 DIAGNOSIS — G441 Vascular headache, not elsewhere classified: Secondary | ICD-10-CM | POA: Diagnosis not present

## 2018-09-28 DIAGNOSIS — R51 Headache with orthostatic component, not elsewhere classified: Secondary | ICD-10-CM

## 2018-09-28 MED ORDER — GADOBENATE DIMEGLUMINE 529 MG/ML IV SOLN
20.0000 mL | Freq: Once | INTRAVENOUS | Status: AC | PRN
  Administered 2018-09-28: 14:00:00 20 mL via INTRAVENOUS

## 2018-09-30 ENCOUNTER — Telehealth: Payer: Self-pay | Admitting: Neurology

## 2018-09-30 NOTE — Telephone Encounter (Signed)
Called the patient, there was no answer. LVM informing the pt of the results. Instructed the patient to call back with any questions that she may have.

## 2018-09-30 NOTE — Telephone Encounter (Signed)
-----   Message from Melvenia Beam, MD sent at 09/29/2018  5:24 PM EDT ----- MRI of the brain is normal for age thanks!

## 2018-10-01 ENCOUNTER — Other Ambulatory Visit: Payer: Self-pay

## 2018-10-01 ENCOUNTER — Other Ambulatory Visit: Payer: Self-pay | Admitting: Internal Medicine

## 2018-10-01 ENCOUNTER — Ambulatory Visit: Payer: Medicare Other

## 2018-10-01 ENCOUNTER — Ambulatory Visit
Admission: RE | Admit: 2018-10-01 | Discharge: 2018-10-01 | Disposition: A | Discharge status: Home / Self Care | Payer: Medicare Other | Source: Ambulatory Visit | Attending: Family Medicine | Admitting: Family Medicine

## 2018-10-01 DIAGNOSIS — N644 Mastodynia: Secondary | ICD-10-CM

## 2018-10-01 DIAGNOSIS — R922 Inconclusive mammogram: Secondary | ICD-10-CM | POA: Diagnosis not present

## 2018-10-06 ENCOUNTER — Other Ambulatory Visit: Payer: Self-pay | Admitting: Family Medicine

## 2018-10-06 MED ORDER — LOSARTAN POTASSIUM-HCTZ 50-12.5 MG PO TABS: 1.0000 | ORAL_TABLET | Freq: Every day | ORAL | 1 refills | Status: DC

## 2018-10-06 NOTE — Telephone Encounter (Signed)
Medication Refill - Medication: losartan-hydrochlorothiazide (HYZAAR) 50-12.5 MG   Has the patient contacted their pharmacy? yes  Preferred Pharmacy (with phone number or street name):  [id number] - [place], [place] - [address] [phone] (Phone) [phone] (Fax)   Agent: Please be advised that RX refills may take up to 3 business days. We ask that you follow-up with your pharmacy.

## 2018-10-06 NOTE — Telephone Encounter (Signed)
Please advise if Dr. Lorelei Pont will refill..shows prescriber as historical provider

## 2018-10-09 ENCOUNTER — Other Ambulatory Visit: Payer: Self-pay

## 2018-10-09 ENCOUNTER — Emergency Department (HOSPITAL_BASED_OUTPATIENT_CLINIC_OR_DEPARTMENT_OTHER)
Admission: EM | Admit: 2018-10-09 | Discharge: 2018-10-09 | Disposition: A | Discharge status: Home / Self Care | Payer: Medicare Other | Attending: Emergency Medicine | Admitting: Emergency Medicine

## 2018-10-09 ENCOUNTER — Encounter (HOSPITAL_BASED_OUTPATIENT_CLINIC_OR_DEPARTMENT_OTHER): Payer: Self-pay | Admitting: *Deleted

## 2018-10-09 DIAGNOSIS — Z79899 Other long term (current) drug therapy: Secondary | ICD-10-CM | POA: Insufficient documentation

## 2018-10-09 DIAGNOSIS — E876 Hypokalemia: Secondary | ICD-10-CM | POA: Insufficient documentation

## 2018-10-09 DIAGNOSIS — Z87891 Personal history of nicotine dependence: Secondary | ICD-10-CM | POA: Diagnosis not present

## 2018-10-09 DIAGNOSIS — I10 Essential (primary) hypertension: Secondary | ICD-10-CM | POA: Diagnosis not present

## 2018-10-09 DIAGNOSIS — R030 Elevated blood-pressure reading, without diagnosis of hypertension: Secondary | ICD-10-CM | POA: Diagnosis present

## 2018-10-09 LAB — CBC WITH DIFFERENTIAL/PLATELET
Abs Immature Granulocytes: 0.01 10*3/uL (ref 0.00–0.07)
Basophils Absolute: 0 10*3/uL (ref 0.0–0.1)
Basophils Relative: 1 %
Eosinophils Absolute: 0.4 10*3/uL (ref 0.0–0.5)
Eosinophils Relative: 5 %
HCT: 34.7 % — ABNORMAL LOW (ref 36.0–46.0)
Hemoglobin: 11.1 g/dL — ABNORMAL LOW (ref 12.0–15.0)
Immature Granulocytes: 0 %
Lymphocytes Relative: 35 %
Lymphs Abs: 2.8 10*3/uL (ref 0.7–4.0)
MCH: 28.8 pg (ref 26.0–34.0)
MCHC: 32 g/dL (ref 30.0–36.0)
MCV: 89.9 fL (ref 80.0–100.0)
Monocytes Absolute: 0.5 10*3/uL (ref 0.1–1.0)
Monocytes Relative: 7 %
Neutro Abs: 4.2 10*3/uL (ref 1.7–7.7)
Neutrophils Relative %: 52 %
Platelets: 286 10*3/uL (ref 150–400)
RBC: 3.86 MIL/uL — ABNORMAL LOW (ref 3.87–5.11)
RDW: 14.4 % (ref 11.5–15.5)
WBC: 8 10*3/uL (ref 4.0–10.5)
nRBC: 0 % (ref 0.0–0.2)

## 2018-10-09 LAB — URINALYSIS, ROUTINE W REFLEX MICROSCOPIC
Bilirubin Urine: NEGATIVE
Glucose, UA: NEGATIVE mg/dL
Hgb urine dipstick: NEGATIVE
Ketones, ur: NEGATIVE mg/dL
Leukocytes,Ua: NEGATIVE
Nitrite: NEGATIVE
Protein, ur: NEGATIVE mg/dL
Specific Gravity, Urine: 1.02 (ref 1.005–1.030)
pH: 8 (ref 5.0–8.0)

## 2018-10-09 LAB — BASIC METABOLIC PANEL
Anion gap: 8 (ref 5–15)
BUN: 11 mg/dL (ref 8–23)
CO2: 30 mmol/L (ref 22–32)
Calcium: 9.1 mg/dL (ref 8.9–10.3)
Chloride: 102 mmol/L (ref 98–111)
Creatinine, Ser: 0.8 mg/dL (ref 0.44–1.00)
GFR calc Af Amer: >60 mL/min (ref >60)
GFR calc non Af Amer: >60 mL/min (ref >60)
Glucose, Bld: 99 mg/dL (ref 70–99)
Potassium: 2.9 mmol/L — ABNORMAL LOW (ref 3.5–5.1)
Sodium: 140 mmol/L (ref 135–145)

## 2018-10-09 MED ORDER — POTASSIUM CHLORIDE CRYS ER 20 MEQ PO TBCR
40.0000 meq | EXTENDED_RELEASE_TABLET | Freq: Once | ORAL | Status: AC
  Administered 2018-10-09: 21:00:00 40 meq via ORAL
  Filled 2018-10-09: qty 2

## 2018-10-09 NOTE — ED Provider Notes (Signed)
[facility] EMERGENCY DEPARTMENT Provider Note   CSN: [id number] Arrival date & time: 10/09/18  1630     History   Chief Complaint Chief Complaint  Patient presents with  . Hypertension    HPI Stephanie Parks is a 73 y.o. female.     Patient is a 73 year old female who presents with elevated blood pressure.  She states over the last couple months her blood pressures have been up and down.  She feels a little lightheaded at times when he gets elevated.  She states it was elevated today to about 150/70.  She denies any chest pain or shortness of breath.  She has had some chronic pain and weakness on her left side which is been evaluated as an outpatient is unchanged.  This is been going on for the last several months.  She recently had an MRI of her brain which showed no acute disease.  She also had a recent cardiac CT which showed a calcium score of 1.     Past Medical History:  Diagnosis Date  . 3-vessel CAD 03/01/2015  . Achalasia 09/28/2012  . Allergic rhinitis 03/01/2015  . Aneurysm ([place]) 03/01/2015  . BP (high blood pressure) 03/01/2015  . Bronchitis 04/23/2018  . Cephalalgia 08/24/2012   Overview:  ICD-10 cut over    . Chest pain 04/04/2011  . CN (constipation) 09/28/2012  . Coughing 04/24/2017  . Decreased potassium in the blood 03/01/2015  . Diverticulosis   . Dizziness 03/01/2015  . GERD (gastroesophageal reflux disease)   . Hiatal hernia   . Hypercholesterolemia 03/01/2015  . Hyperlipidemia   . Hypertension   . Ingrown toenail 08/10/2017  . Inguinal hernia   . Migraine headache   . Onychomycosis 12/08/2017  . Paresthesia of arm 03/01/2015  . Schatzki's ring   . Tendonitis, Achilles, right 12/08/2017  . Unilateral primary osteoarthritis, left knee 10/07/2017    Patient Active Problem List   Diagnosis Date Noted  . History of chest pain 07/20/2018  . History of palpitations 07/20/2018  . Brain aneurysm 07/20/2018  . Bronchitis 04/23/2018  . Tendonitis,  Achilles, right 12/08/2017  . Onychomycosis 12/08/2017  . Unilateral primary osteoarthritis, left knee 10/07/2017  . Ingrown toenail 08/10/2017  . Coughing 04/24/2017  . CAD (coronary artery disease) 03/01/2015  . Aneurysm ([place]) 03/01/2015  . Dizziness 03/01/2015  . Hypercholesterolemia 03/01/2015  . Decreased potassium in the blood 03/01/2015  . Paresthesia of arm 03/01/2015  . Allergic rhinitis 03/01/2015  . Achalasia 09/28/2012  . CN (constipation) 09/28/2012  . Cephalalgia 08/24/2012  . HTN (hypertension) 04/19/2011  . Hyperlipidemia 04/19/2011  . GERD (gastroesophageal reflux disease) 04/19/2011  . Chest pain 04/04/2011    Past Surgical History:  Procedure Laterality Date  . ABDOMINAL HYSTERECTOMY    . BACK SURGERY     x 3  . CARPAL TUNNEL RELEASE     left wrist  . CERVICAL SPINE SURGERY    . HEMORRHOID SURGERY       OB History   No obstetric history on file.      Home Medications    Prior to Admission medications   Medication Sig Start Date End Date Taking? Authorizing Provider  alendronate (FOSAMAX) 10 MG tablet TAKE 1 TABLET BY MOUTH DAILY BEFORE BREAKFAST. TAKE WITH A FULL GLASS OF WATER ON EMPTY STOMACH 03/17/18   Copland, Gay Filler, MD  aspirin 81 MG tablet Take 81 mg by mouth daily.    [provider]  Calcium Carbonate (CALCIUM 600  PO) Take 1 tablet by mouth daily.    [provider]  fluticasone (FLONASE) 50 MCG/ACT nasal spray USE 2 SPRAYS IN NOSTRIL(S) DAILY AS NEEDED. 01/14/18   Copland, Gay Filler, MD  KLOR-CON M20 20 MEQ tablet TAKE 1 TABLET BY MOUTH EVERY DAY 08/11/18   Copland, Gay Filler, MD  levocetirizine (XYZAL) 5 MG tablet Take 1 tablet (5 mg total) by mouth every evening. 07/20/18   Ann Held, DO  losartan-hydrochlorothiazide (HYZAAR) 50-12.5 MG tablet Take 1 tablet by mouth daily. 10/06/18   Copland, Gay Filler, MD  Multiple Vitamins-Minerals (MULTIVITAL) tablet Take by mouth.    [provider]  NONFORMULARY  OR COMPOUNDED ITEM Allergen immunotherapy 03/01/15   Leda Roys, MD  pantoprazole (PROTONIX) 40 MG tablet TAKE 1 TABLET (40 MG TOTAL) BY MOUTH DAILY. TAKE 30-60 MINUTES BEFORE BREAKFAST. 07/14/18   Pyrtle, Lajuan Lines, MD  rosuvastatin (CRESTOR) 10 MG tablet Take 1 tablet (10 mg total) by mouth daily. 05/28/18   Jettie Booze, MD  senna (SENOKOT) 8.6 MG tablet Take 1 tablet by mouth 2 (two) times a day.    [provider]    Family History Family History  Problem Relation Age of Onset  . Allergic rhinitis Sister   . Diabetes Sister   . Hypertension Sister   . Heart attack Father 64  . Heart disease Father   . Stomach cancer Maternal Grandmother   . Heart disease Mother   . Colon cancer Neg Hx   . Angioedema Neg Hx   . Asthma Neg Hx   . Eczema Neg Hx   . Urticaria Neg Hx   . Immunodeficiency Neg Hx   . Breast cancer Neg Hx   . Migraines Neg Hx   . Headache Neg Hx     Social History Social History   Tobacco Use  . Smoking status: Former Smoker    Packs/day: 0.50    Years: 20.00    Pack years: 10.00    Types: Cigarettes    Quit date: 02/19/1984    Years since quitting: 34.6  . Smokeless tobacco: Never Used  Substance Use Topics  . Alcohol use: No  . Drug use: No     Allergies   Shellfish allergy, Ace inhibitors, Pitavastatin, and Sulfa antibiotics   Review of Systems Review of Systems  Constitutional: Negative for chills, diaphoresis, fatigue and fever.  HENT: Negative for congestion, rhinorrhea and sneezing.   Eyes: Negative.   Respiratory: Negative for cough, chest tightness and shortness of breath.   Cardiovascular: Negative for chest pain and leg swelling.  Gastrointestinal: Negative for abdominal pain, blood in stool, diarrhea, nausea and vomiting.  Genitourinary: Negative for difficulty urinating, flank pain, frequency and hematuria.  Musculoskeletal: Negative for arthralgias and back pain.  Skin: Negative for rash.  Neurological: Positive for  weakness, light-headedness and numbness. Negative for dizziness, speech difficulty and headaches.       Numbness and weakness to left arm     Physical Exam Updated Vital Signs BP 139/68   Pulse (!) 58   Temp 98.5 F (36.9 C) (Oral)   Resp 18   Ht 5\' 5"  (1.651 m)   Wt 99.8 kg   SpO2 98%   BMI 36.61 kg/m   Physical Exam Constitutional:      Appearance: She is well-developed.  HENT:     Head: Normocephalic and atraumatic.  Eyes:     Pupils: Pupils are equal, round, and reactive to light.  Neck:  Musculoskeletal: Normal range of motion and neck supple.  Cardiovascular:     Rate and Rhythm: Normal rate and regular rhythm.     Heart sounds: Normal heart sounds.  Pulmonary:     Effort: Pulmonary effort is normal. No respiratory distress.     Breath sounds: Normal breath sounds. No wheezing or rales.  Chest:     Chest wall: No tenderness.  Abdominal:     General: Bowel sounds are normal.     Palpations: Abdomen is soft.     Tenderness: There is no abdominal tenderness. There is no guarding or rebound.  Musculoskeletal: Normal range of motion.  Lymphadenopathy:     Cervical: No cervical adenopathy.  Skin:    General: Skin is warm and dry.     Findings: No rash.  Neurological:     General: No focal deficit present.     Mental Status: She is alert and oriented to person, place, and time.      ED Treatments / Results  Labs (all labs ordered are listed, but only abnormal results are displayed) Labs Reviewed  BASIC METABOLIC PANEL - Abnormal; Notable for the following components:      Result Value   Potassium 2.9 (*)    All other components within normal limits  CBC WITH DIFFERENTIAL/PLATELET - Abnormal; Notable for the following components:   RBC 3.86 (*)    Hemoglobin 11.1 (*)    HCT 34.7 (*)    All other components within normal limits  URINALYSIS, ROUTINE W REFLEX MICROSCOPIC    EKG EKG Interpretation  Date/Time:  Friday October 09 2018 16:45:36 EDT  Ventricular Rate:  74 PR Interval:  188 QRS Duration: 92 QT Interval:  396 QTC Calculation: 439 R Axis:   10 Text Interpretation:  Normal sinus rhythm Nonspecific ST and T wave abnormality Abnormal ECG since last tracing no significant change Confirmed by Malvin Johns [phone]) on 10/09/2018 6:46:28 PM   Radiology No results found.  Procedures Procedures (including critical care time)  Medications Ordered in ED Medications  potassium chloride SA (K-DUR) CR tablet 40 mEq (40 mEq Oral Given 10/09/18 2048)     Initial Impression / Assessment and Plan / ED Course  I have reviewed the triage vital signs and the nursing notes.  Pertinent labs & imaging results that were available during my care of the patient were reviewed by me and considered in my medical decision making (see chart for details).        Patient presents with elevated blood pressures.  Her blood pressure actually is not significantly elevated.  Her last blood pressure is 140/76.  She is currently asymptomatic.  She states that she gets lightheaded when her blood pressure goes up but she is currently denying any other new symptoms.  She has had some chronic numbness and weakness syndrome left side but this is unchanged.  She is had a recent MRI which was normal.  She has no other neurologic deficits.  Her EKG does not show any ischemic changes.  Her labs do show a hypokalemia which is similar to the findings on her last visit.  This had actually improved after she doubled up on her potassium for a few days but now is dropped back down again.  She was given some replacement here in the ED and I advised her to double it up for the next 3 days and follow-up with her PCP to have it rechecked.  Advised her to keep a blood pressure log at  home to see how her blood pressures are trending on a day-to-day basis and follow-up with her PCP.  Return precautions were given.  Final Clinical Impressions(s) / ED Diagnoses   Final  diagnoses:  Essential hypertension  Hypokalemia    ED Discharge Orders    None       Malvin Johns, MD 10/09/18 2255

## 2018-10-09 NOTE — ED Triage Notes (Signed)
HTN. She was seen for the same 20 days ago for same. She feels like her heart is "beating fast".

## 2018-10-09 NOTE — Discharge Instructions (Addendum)
Double up on your potassium dose for the next 3 days only then go back to your normal dose.  Follow-up with your doctor on Monday to have your potassium rechecked.  Return here as needed for any worsening symptoms.

## 2018-10-12 ENCOUNTER — Other Ambulatory Visit: Payer: Self-pay

## 2018-10-12 ENCOUNTER — Encounter: Payer: Self-pay | Admitting: Family Medicine

## 2018-10-12 ENCOUNTER — Ambulatory Visit (INDEPENDENT_AMBULATORY_CARE_PROVIDER_SITE_OTHER): Payer: Medicare Other | Admitting: Family Medicine

## 2018-10-12 VITALS — BP 130/68 | HR 78 | Temp 97.5°F | Resp 16 | Ht 65.0 in | Wt 214.0 lb

## 2018-10-12 DIAGNOSIS — L819 Disorder of pigmentation, unspecified: Secondary | ICD-10-CM | POA: Diagnosis not present

## 2018-10-12 DIAGNOSIS — I10 Essential (primary) hypertension: Secondary | ICD-10-CM | POA: Diagnosis not present

## 2018-10-12 DIAGNOSIS — I872 Venous insufficiency (chronic) (peripheral): Secondary | ICD-10-CM

## 2018-10-12 DIAGNOSIS — E876 Hypokalemia: Secondary | ICD-10-CM | POA: Diagnosis not present

## 2018-10-12 MED ORDER — LOSARTAN POTASSIUM 50 MG PO TABS: 50.0000 mg | ORAL_TABLET | Freq: Every day | ORAL | 3 refills | Status: DC

## 2018-10-12 NOTE — Progress Notes (Addendum)
[facility] [place], [place], [place] [address] [phone]  Date:  10/12/2018   Name:  Stephanie Parks   DOB:  1945-09-17   MRN:  [id number]  PCP:  [name], MD    Chief Complaint: Dizziness (went to ER friday, low potassium )   History of Present Illness:  Stephanie Parks is a 73 y.o. very pleasant female patient who presents with the following:  Here today for an ER follow-up visit Seen on Friday with elevated BP - her BP was 150/70 and she felt dizzy so she went to the ER  She was evaluated and released to home- her BP in the Er was 140/76  She is taking losartan/hctz 50/12.5 She is upset because her K was low in the ER They had her double up her potassium for a couple of days - she is now back on her normal 20 meq per day   She did not take her BP meds yet today  BP Readings from Last 3 Encounters:  10/12/18 130/68  10/09/18 140/76  09/21/18 126/72   This am her BP was 122/63  She notes that she had a headache 2 days ago. She took tylenol #3 and it resolved She notes darkening of the skin on her bilateral arms or maybe just on the right arm- not itchy  She also notes that her left ankle sometimes swollen towards the end of the day, this is been present for several years.  It is not painful or otherwise bothersome, she does want to mention it.  It gets better overnight  Declines flu shot  Patient Active Problem List   Diagnosis Date Noted  . History of chest pain 07/20/2018  . History of palpitations 07/20/2018  . Brain aneurysm 07/20/2018  . Bronchitis 04/23/2018  . Tendonitis, Achilles, right 12/08/2017  . Onychomycosis 12/08/2017  . Unilateral primary osteoarthritis, left knee 10/07/2017  . Ingrown toenail 08/10/2017  . Coughing 04/24/2017  . CAD (coronary artery disease) 03/01/2015  . Aneurysm ([place]) 03/01/2015  . Dizziness 03/01/2015  . Hypercholesterolemia 03/01/2015  .  Decreased potassium in the blood 03/01/2015  . Paresthesia of arm 03/01/2015  . Allergic rhinitis 03/01/2015  . Achalasia 09/28/2012  . CN (constipation) 09/28/2012  . Cephalalgia 08/24/2012  . HTN (hypertension) 04/19/2011  . Hyperlipidemia 04/19/2011  . GERD (gastroesophageal reflux disease) 04/19/2011  . Chest pain 04/04/2011    Past Medical History:  Diagnosis Date  . 3-vessel CAD 03/01/2015  . Achalasia 09/28/2012  . Allergic rhinitis 03/01/2015  . Aneurysm ([place]) 03/01/2015  . BP (high blood pressure) 03/01/2015  . Bronchitis 04/23/2018  . Cephalalgia 08/24/2012   Overview:  ICD-10 cut over    . Chest pain 04/04/2011  . CN (constipation) 09/28/2012  . Coughing 04/24/2017  . Decreased potassium in the blood 03/01/2015  . Diverticulosis   . Dizziness 03/01/2015  . GERD (gastroesophageal reflux disease)   . Hiatal hernia   . Hypercholesterolemia 03/01/2015  . Hyperlipidemia   . Hypertension   . Ingrown toenail 08/10/2017  . Inguinal hernia   . Migraine headache   . Onychomycosis 12/08/2017  . Paresthesia of arm 03/01/2015  . Schatzki's ring   . Tendonitis, Achilles, right 12/08/2017  . Unilateral primary osteoarthritis, left knee 10/07/2017    Past Surgical History:  Procedure Laterality Date  . ABDOMINAL HYSTERECTOMY    . BACK SURGERY     x 3  .  CARPAL TUNNEL RELEASE     left wrist  . CERVICAL SPINE SURGERY    . HEMORRHOID SURGERY      Social History   Tobacco Use  . Smoking status: Former Smoker    Packs/day: 0.50    Years: 20.00    Pack years: 10.00    Types: Cigarettes    Quit date: 02/19/1984    Years since quitting: 34.6  . Smokeless tobacco: Never Used  Substance Use Topics  . Alcohol use: No  . Drug use: No    Family History  Problem Relation Age of Onset  . Allergic rhinitis Sister   . Diabetes Sister   . Hypertension Sister   . Heart attack Father 24  . Heart disease Father   . Stomach cancer Maternal Grandmother   . Heart disease Mother   . Colon  cancer Neg Hx   . Angioedema Neg Hx   . Asthma Neg Hx   . Eczema Neg Hx   . Urticaria Neg Hx   . Immunodeficiency Neg Hx   . Breast cancer Neg Hx   . Migraines Neg Hx   . Headache Neg Hx     Allergies  Allergen Reactions  . Shellfish Allergy Hives and Swelling  . Ace Inhibitors Other (See Comments)    Pt cannot recall her reaction but tolerates arb   . Pitavastatin   . Sulfa Antibiotics Other (See Comments) and Rash    Fine bumps    Medication list has been reviewed and updated.  Current Outpatient Medications on File Prior to Visit  Medication Sig Dispense Refill  . alendronate (FOSAMAX) 10 MG tablet TAKE 1 TABLET BY MOUTH DAILY BEFORE BREAKFAST. TAKE WITH A FULL GLASS OF WATER ON EMPTY STOMACH 90 tablet 1  . aspirin 81 MG tablet Take 81 mg by mouth daily.    . Calcium Carbonate (CALCIUM 600 PO) Take 1 tablet by mouth daily.    . fluticasone (FLONASE) 50 MCG/ACT nasal spray USE 2 SPRAYS IN NOSTRIL(S) DAILY AS NEEDED. 16 g 9  . KLOR-CON M20 20 MEQ tablet TAKE 1 TABLET BY MOUTH EVERY DAY 90 tablet 1  . levocetirizine (XYZAL) 5 MG tablet Take 1 tablet (5 mg total) by mouth every evening. 30 tablet 5  . losartan-hydrochlorothiazide (HYZAAR) 50-12.5 MG tablet Take 1 tablet by mouth daily. 90 tablet 1  . Multiple Vitamins-Minerals (MULTIVITAL) tablet Take by mouth.    . NONFORMULARY OR COMPOUNDED ITEM Allergen immunotherapy 2 each 1  . pantoprazole (PROTONIX) 40 MG tablet TAKE 1 TABLET (40 MG TOTAL) BY MOUTH DAILY. TAKE 30-60 MINUTES BEFORE BREAKFAST. 90 tablet 0  . rosuvastatin (CRESTOR) 10 MG tablet Take 1 tablet (10 mg total) by mouth daily. 90 tablet 3  . senna (SENOKOT) 8.6 MG tablet Take 1 tablet by mouth 2 (two) times a day.     No current facility-administered medications on file prior to visit.     Review of Systems:  As per HPI- otherwise negative. No fever or chills No chest pain or shortness of breath  Physical Examination: Vitals:   10/12/18 1408  BP: 130/68   Pulse: 78  Resp: 16  Temp: (!) 97.5 F (36.4 C)  SpO2: 98%   Vitals:   10/12/18 1408  Weight: 214 lb (97.1 kg)  Height: 5\' 5"  (1.651 m)   Body mass index is 35.61 kg/m. Ideal Body Weight: Weight in (lb) to have BMI = 25: 149.9  GEN: WDWN, NAD, Non-toxic, A & O x 3,  obese, looks well HEENT: Atraumatic, Normocephalic. Neck supple. No masses, No LAD. Ears and Nose: No external deformity. CV: RRR, No M/G/R. No JVD. No thrill. No extra heart sounds. PULM: CTA B, no wheezes, crackles, rhonchi. No retractions. No resp. distress. No accessory muscle use. ABD: S, NT, ND, +BS. No rebound. No HSM. EXTR: No c/c.  There is minimal trace edema of the left ankle NEURO Normal gait.  PSYCH: Normally interactive. Conversant. Not depressed or anxious appearing.  Calm demeanor.  There is a barely discernible area of hyperpigmentation on the right biceps, about 2 inches in diameter.  The patient notes the area was itchy, now seems to be darkened   Assessment and Plan:   ICD-10-CM   1. Essential hypertension  I10 losartan (COZAAR) 50 MG tablet  2. Hypokalemia  [id number] Basic metabolic panel  3. Venous insufficiency  I87.2   4. Hyperpigmentation of skin  L81.9    Here today to follow-up on her blood pressure and hypokalemia.  She was seen in the ER on Friday due to mildly increased blood pressure and a feeling of malaise Today her blood pressure is normal, she has not taken her medication yet today I advised that hypokalemia is likely due to her diuretic-we will stop this medication, may need to increase her losartan We will check her potassium today, and be in touch with her  Advised that her venous insufficiency is unlikely to be dangerous, she may use compression socks as tolerated Likely postinflammatory hyperpigmentation.  Let me know if it does not resolve  Follow-up: No follow-ups on file.  Meds ordered this encounter  Medications  . losartan (COZAAR) 50 MG tablet    Sig: Take 1-2  tablets (50-100 mg total) by mouth daily. For blood pressure    Dispense:  180 tablet    Refill:  3   Orders Placed This Encounter  Procedures  . Basic metabolic panel   Signed Lamar Blinks, MD  Received her BMP 8/25- called pt She is taking losartan 50, she will stop taking the potassium for now BP well controlled this am   Asked her to see me in 4-6 weeks   Results for orders placed or performed in visit on [id number]  Basic metabolic panel  Result Value Ref Range   Sodium 141 135 - 145 mEq/L   Potassium 3.6 3.5 - 5.1 mEq/L   Chloride 101 96 - 112 mEq/L   CO2 32 19 - 32 mEq/L   Glucose, Bld 103 (H) 70 - 99 mg/dL   BUN 15 6 - 23 mg/dL   Creatinine, Ser 0.86 0.40 - 1.20 mg/dL   Calcium 9.4 8.4 - 10.5 mg/dL   GFR 78.26 >60.00 mL/min

## 2018-10-12 NOTE — Patient Instructions (Signed)
I suspect that your HCTZ (hydrochlorothiazide) is causing your potassium to be low.  We will stop the losartan/hctz that you are taking for BP and change to losartan 50 mg plain.    Please start with 50 mg.  However, if your BP is higher than 140/90 you can increase to 1.5 pills or 75 mg- we might end up increasing to 100 mg eventually I will check your potassium today Hopefully we can stop the potassium pills also!  Your mild ankle swelling is likely caused by venous insufficiency or leaky veins.  Elevating your legs and compression socks or stockings can help  Our blood pressure goal is less than 140/90.  However mildly increased BP- --160 -170/ 90- 100---- is not generally dangerous in the short term and can be managed in the office  Take care

## 2018-10-13 LAB — BASIC METABOLIC PANEL
BUN: 15 mg/dL (ref 6–23)
CO2: 32 mEq/L (ref 19–32)
Calcium: 9.4 mg/dL (ref 8.4–10.5)
Chloride: 101 mEq/L (ref 96–112)
Creatinine, Ser: 0.86 mg/dL (ref 0.40–1.20)
GFR: 78.26 mL/min (ref >60.00)
Glucose, Bld: 103 mg/dL — ABNORMAL HIGH (ref 70–99)
Potassium: 3.6 mEq/L (ref 3.5–5.1)
Sodium: 141 mEq/L (ref 135–145)

## 2018-10-13 NOTE — Addendum Note (Signed)
Addended by: Lamar Blinks C on: 10/13/2018 12:05 PM   Modules accepted: Orders

## 2018-10-15 ENCOUNTER — Telehealth: Payer: Self-pay | Admitting: Family Medicine

## 2018-10-15 DIAGNOSIS — M5432 Sciatica, left side: Secondary | ICD-10-CM

## 2018-10-15 NOTE — Telephone Encounter (Signed)
Copied from [place] [id number]. Topic: Quick Communication - Rx Refill/Question >> Oct 15, 2018  3:33 PM Mcneil, Ja-Kwan wrote: Medication: acetaminophen-codeine (TYLENOL #3) 300-30 MG tablet  Has the patient contacted their pharmacy? no  Preferred Pharmacy (with phone number or street name): [id number] - [place], [place] - [address] [phone] (Phone) [phone] (Fax)  Agent: Please be advised that RX refills may take up to 3 business days. We ask that you follow-up with your pharmacy.

## 2018-10-16 MED ORDER — ACETAMINOPHEN-CODEINE #3 300-30 MG PO TABS: 1.0000 | ORAL_TABLET | Freq: Three times a day (TID) | ORAL | 0 refills | Status: DC | PRN

## 2018-10-16 NOTE — Telephone Encounter (Signed)
Prescription is not on patients current med list please advise if refill is appropriate

## 2018-10-17 ENCOUNTER — Encounter (HOSPITAL_COMMUNITY): Payer: Self-pay | Admitting: Emergency Medicine

## 2018-10-17 ENCOUNTER — Emergency Department (HOSPITAL_COMMUNITY)
Admission: EM | Admit: 2018-10-17 | Discharge: 2018-10-18 | Disposition: A | Discharge status: Home / Self Care | Payer: Medicare Other | Attending: Emergency Medicine | Admitting: Emergency Medicine

## 2018-10-17 ENCOUNTER — Other Ambulatory Visit: Payer: Self-pay

## 2018-10-17 ENCOUNTER — Emergency Department (HOSPITAL_COMMUNITY): Payer: Medicare Other

## 2018-10-17 DIAGNOSIS — I10 Essential (primary) hypertension: Secondary | ICD-10-CM | POA: Insufficient documentation

## 2018-10-17 DIAGNOSIS — E876 Hypokalemia: Secondary | ICD-10-CM | POA: Diagnosis not present

## 2018-10-17 DIAGNOSIS — R002 Palpitations: Secondary | ICD-10-CM | POA: Diagnosis not present

## 2018-10-17 DIAGNOSIS — Z79899 Other long term (current) drug therapy: Secondary | ICD-10-CM | POA: Diagnosis not present

## 2018-10-17 DIAGNOSIS — R000 Tachycardia, unspecified: Secondary | ICD-10-CM | POA: Diagnosis not present

## 2018-10-17 DIAGNOSIS — Z7982 Long term (current) use of aspirin: Secondary | ICD-10-CM | POA: Insufficient documentation

## 2018-10-17 DIAGNOSIS — Z87891 Personal history of nicotine dependence: Secondary | ICD-10-CM | POA: Diagnosis not present

## 2018-10-17 DIAGNOSIS — I2510 Atherosclerotic heart disease of native coronary artery without angina pectoris: Secondary | ICD-10-CM | POA: Insufficient documentation

## 2018-10-17 DIAGNOSIS — R531 Weakness: Secondary | ICD-10-CM | POA: Insufficient documentation

## 2018-10-17 LAB — CBC
HCT: 36.1 % (ref 36.0–46.0)
Hemoglobin: 11.5 g/dL — ABNORMAL LOW (ref 12.0–15.0)
MCH: 28.8 pg (ref 26.0–34.0)
MCHC: 31.9 g/dL (ref 30.0–36.0)
MCV: 90.3 fL (ref 80.0–100.0)
Platelets: 323 10*3/uL (ref 150–400)
RBC: 4 MIL/uL (ref 3.87–5.11)
RDW: 14.5 % (ref 11.5–15.5)
WBC: 9.4 10*3/uL (ref 4.0–10.5)
nRBC: 0 % (ref 0.0–0.2)

## 2018-10-17 LAB — TROPONIN I (HIGH SENSITIVITY)
Troponin I (High Sensitivity): 5 ng/L (ref <18)
Troponin I (High Sensitivity): 7 ng/L (ref <18)

## 2018-10-17 LAB — BASIC METABOLIC PANEL
Anion gap: 9 (ref 5–15)
BUN: 12 mg/dL (ref 8–23)
CO2: 27 mmol/L (ref 22–32)
Calcium: 9.1 mg/dL (ref 8.9–10.3)
Chloride: 105 mmol/L (ref 98–111)
Creatinine, Ser: 0.85 mg/dL (ref 0.44–1.00)
GFR calc Af Amer: >60 mL/min (ref >60)
GFR calc non Af Amer: >60 mL/min (ref >60)
Glucose, Bld: 147 mg/dL — ABNORMAL HIGH (ref 70–99)
Potassium: 2.8 mmol/L — ABNORMAL LOW (ref 3.5–5.1)
Sodium: 141 mmol/L (ref 135–145)

## 2018-10-17 MED ORDER — SODIUM CHLORIDE 0.9% FLUSH: 3.0000 mL | Freq: Once | INTRAVENOUS | Status: DC

## 2018-10-17 MED ORDER — POTASSIUM CHLORIDE CRYS ER 20 MEQ PO TBCR: EXTENDED_RELEASE_TABLET | ORAL | 0 refills | Status: DC

## 2018-10-17 MED ORDER — HYDROXYZINE HCL 25 MG PO TABS: 25.0000 mg | ORAL_TABLET | Freq: Three times a day (TID) | ORAL | 0 refills | Status: DC | PRN

## 2018-10-17 MED ORDER — LORAZEPAM 1 MG PO TABS
1.0000 mg | ORAL_TABLET | Freq: Once | ORAL | Status: AC
  Administered 2018-10-17: 1 mg via ORAL
  Filled 2018-10-17: qty 1

## 2018-10-17 MED ORDER — POTASSIUM CHLORIDE 10 MEQ/100ML IV SOLN
10.0000 meq | Freq: Once | INTRAVENOUS | Status: AC
  Administered 2018-10-17: 10 meq via INTRAVENOUS
  Filled 2018-10-17: qty 100

## 2018-10-17 MED ORDER — POTASSIUM CHLORIDE CRYS ER 20 MEQ PO TBCR
40.0000 meq | EXTENDED_RELEASE_TABLET | Freq: Once | ORAL | Status: AC
  Administered 2018-10-17: 40 meq via ORAL
  Filled 2018-10-17: qty 2

## 2018-10-17 NOTE — ED Triage Notes (Signed)
Pt c/o intermittent episodes of palpitations, weakness and HA, recurrent for several months. Pt states she has been worked ups several times for same with no etiology.  A & O, no neuro deficits noted at this time.

## 2018-10-17 NOTE — ED Provider Notes (Signed)
[facility] Provider Note   CSN: [id number] Arrival date & time: 10/17/18  1903     History   Chief Complaint Chief Complaint  Patient presents with  . Palpitations    HPI Stephanie Parks is a 73 y.o. female.     Patient c/o sense of intermittent palpitations, feelings of anxiousness, general weakness, for the past several months. Patient indicates episodes last seconds to minutes. Has recently seen ED and pcp for same. On arrival to room, patient questions whether symptoms could be related to anxiety. Denies recent or unusual stressor. No severe headaches. Denies change in speech or vision. No focal numbness/weakness. No chest pain. No sob or unusual doe. No cough or uri symptoms. No syncope.   The history is provided by the patient.  Palpitations Associated symptoms: weakness   Associated symptoms: no back pain, no chest pain, no shortness of breath and no vomiting     Past Medical History:  Diagnosis Date  . 3-vessel CAD 03/01/2015  . Achalasia 09/28/2012  . Allergic rhinitis 03/01/2015  . Aneurysm ([place]) 03/01/2015  . BP (high blood pressure) 03/01/2015  . Bronchitis 04/23/2018  . Cephalalgia 08/24/2012   Overview:  ICD-10 cut over    . Chest pain 04/04/2011  . CN (constipation) 09/28/2012  . Coughing 04/24/2017  . Decreased potassium in the blood 03/01/2015  . Diverticulosis   . Dizziness 03/01/2015  . GERD (gastroesophageal reflux disease)   . Hiatal hernia   . Hypercholesterolemia 03/01/2015  . Hyperlipidemia   . Hypertension   . Ingrown toenail 08/10/2017  . Inguinal hernia   . Migraine headache   . Onychomycosis 12/08/2017  . Paresthesia of arm 03/01/2015  . Schatzki's ring   . Tendonitis, Achilles, right 12/08/2017  . Unilateral primary osteoarthritis, left knee 10/07/2017    Patient Active Problem List   Diagnosis Date Noted  . History of chest pain 07/20/2018  . History of palpitations 07/20/2018  . Brain aneurysm  07/20/2018  . Bronchitis 04/23/2018  . Tendonitis, Achilles, right 12/08/2017  . Onychomycosis 12/08/2017  . Unilateral primary osteoarthritis, left knee 10/07/2017  . Ingrown toenail 08/10/2017  . Coughing 04/24/2017  . CAD (coronary artery disease) 03/01/2015  . Aneurysm ([place]) 03/01/2015  . Dizziness 03/01/2015  . Hypercholesterolemia 03/01/2015  . Decreased potassium in the blood 03/01/2015  . Paresthesia of arm 03/01/2015  . Allergic rhinitis 03/01/2015  . Achalasia 09/28/2012  . CN (constipation) 09/28/2012  . Cephalalgia 08/24/2012  . HTN (hypertension) 04/19/2011  . Hyperlipidemia 04/19/2011  . GERD (gastroesophageal reflux disease) 04/19/2011  . Chest pain 04/04/2011    Past Surgical History:  Procedure Laterality Date  . ABDOMINAL HYSTERECTOMY    . BACK SURGERY     x 3  . CARPAL TUNNEL RELEASE     left wrist  . CERVICAL SPINE SURGERY    . HEMORRHOID SURGERY       OB History   No obstetric history on file.      Home Medications    Prior to Admission medications   Medication Sig Start Date End Date Taking? Authorizing Provider  acetaminophen-codeine (TYLENOL #3) 300-30 MG tablet Take 1-2 tablets by mouth every 8 (eight) hours as needed for moderate pain. 10/16/18   Copland, Gay Filler, MD  alendronate (FOSAMAX) 10 MG tablet TAKE 1 TABLET BY MOUTH DAILY BEFORE BREAKFAST. TAKE WITH A FULL GLASS OF WATER ON EMPTY STOMACH 03/17/18   Copland, Gay Filler, MD  aspirin 81 MG tablet Take 81  mg by mouth daily.    [provider]  Calcium Carbonate (CALCIUM 600 PO) Take 1 tablet by mouth daily.    [provider]  fluticasone (FLONASE) 50 MCG/ACT nasal spray USE 2 SPRAYS IN NOSTRIL(S) DAILY AS NEEDED. 01/14/18   Copland, Gay Filler, MD  levocetirizine (XYZAL) 5 MG tablet Take 1 tablet (5 mg total) by mouth every evening. 07/20/18   Ann Held, DO  losartan (COZAAR) 50 MG tablet Take 1-2 tablets (50-100 mg total) by mouth daily. For blood pressure  10/12/18   Copland, Gay Filler, MD  Multiple Vitamins-Minerals (MULTIVITAL) tablet Take by mouth.    [provider]  NONFORMULARY OR COMPOUNDED ITEM Allergen immunotherapy 03/01/15   Leda Roys, MD  pantoprazole (PROTONIX) 40 MG tablet TAKE 1 TABLET (40 MG TOTAL) BY MOUTH DAILY. TAKE 30-60 MINUTES BEFORE BREAKFAST. 07/14/18   Pyrtle, Lajuan Lines, MD  rosuvastatin (CRESTOR) 10 MG tablet Take 1 tablet (10 mg total) by mouth daily. 05/28/18   Jettie Booze, MD  senna (SENOKOT) 8.6 MG tablet Take 1 tablet by mouth 2 (two) times a day.    [provider]    Family History Family History  Problem Relation Age of Onset  . Allergic rhinitis Sister   . Diabetes Sister   . Hypertension Sister   . Heart attack Father 51  . Heart disease Father   . Stomach cancer Maternal Grandmother   . Heart disease Mother   . Colon cancer Neg Hx   . Angioedema Neg Hx   . Asthma Neg Hx   . Eczema Neg Hx   . Urticaria Neg Hx   . Immunodeficiency Neg Hx   . Breast cancer Neg Hx   . Migraines Neg Hx   . Headache Neg Hx     Social History Social History   Tobacco Use  . Smoking status: Former Smoker    Packs/day: 0.50    Years: 20.00    Pack years: 10.00    Types: Cigarettes    Quit date: 02/19/1984    Years since quitting: 34.6  . Smokeless tobacco: Never Used  Substance Use Topics  . Alcohol use: No  . Drug use: No     Allergies   Shellfish allergy, Ace inhibitors, Pitavastatin, and Sulfa antibiotics   Review of Systems Review of Systems  Constitutional: Negative for chills and fever.  Eyes: Negative for pain and visual disturbance.  Respiratory: Negative for shortness of breath.   Cardiovascular: Positive for palpitations. Negative for chest pain.  Gastrointestinal: Negative for abdominal pain and vomiting.  Genitourinary: Negative for dysuria and flank pain.  Musculoskeletal: Negative for back pain and neck pain.  Skin: Negative for rash.  Neurological: Positive for  weakness. Negative for speech difficulty and headaches.  Hematological: Does not bruise/bleed easily.  Psychiatric/Behavioral: Negative for confusion. The patient is nervous/anxious.      Physical Exam Updated Vital Signs BP (!) 164/81   Pulse 84   Temp 98.4 F (36.9 C) (Oral)   Resp 17   Ht 1.651 m (5\' 5" )   Wt 97 kg   SpO2 100%   BMI 35.59 kg/m   Physical Exam Vitals signs and nursing note reviewed.  Constitutional:      Appearance: Normal appearance. She is well-developed.  HENT:     Head: Atraumatic.     Nose: Nose normal.     Mouth/Throat:     Mouth: Mucous membranes are moist.  Eyes:     General:  No scleral icterus.    Conjunctiva/sclera: Conjunctivae normal.     Pupils: Pupils are equal, round, and reactive to light.  Neck:     Musculoskeletal: Normal range of motion and neck supple. No neck rigidity or muscular tenderness.     Vascular: No carotid bruit.     Trachea: No tracheal deviation.     Comments: Thyroid not enlarged or tender.  Cardiovascular:     Rate and Rhythm: Normal rate and regular rhythm.     Pulses: Normal pulses.     Heart sounds: Normal heart sounds. No murmur. No friction rub. No gallop.   Pulmonary:     Effort: Pulmonary effort is normal. No respiratory distress.     Breath sounds: Normal breath sounds.  Abdominal:     General: Bowel sounds are normal. There is no distension.     Palpations: Abdomen is soft.     Tenderness: There is no abdominal tenderness. There is no guarding.  Genitourinary:    Comments: No cva tenderness.  Musculoskeletal:        General: No swelling or tenderness.     Right lower leg: No edema.     Left lower leg: No edema.  Skin:    General: Skin is warm and dry.     Findings: No rash.  Neurological:     Mental Status: She is alert.     Cranial Nerves: No cranial nerve deficit.     Comments: Alert, speech normal/fluent. Motor intact bil, stre 5/5. sens grossly intact. Steady gait.   Psychiatric:      Comments: Anxious appearing.       ED Treatments / Results  Labs (all labs ordered are listed, but only abnormal results are displayed) Results for orders placed or performed during the hospital encounter of [id number]  Basic metabolic panel  Result Value Ref Range   Sodium 141 135 - 145 mmol/L   Potassium 2.8 (L) 3.5 - 5.1 mmol/L   Chloride 105 98 - 111 mmol/L   CO2 27 22 - 32 mmol/L   Glucose, Bld 147 (H) 70 - 99 mg/dL   BUN 12 8 - 23 mg/dL   Creatinine, Ser 0.85 0.44 - 1.00 mg/dL   Calcium 9.1 8.9 - 10.3 mg/dL   GFR calc non Af Amer >60 >60 mL/min   GFR calc Af Amer >60 >60 mL/min   Anion gap 9 5 - 15  CBC  Result Value Ref Range   WBC 9.4 4.0 - 10.5 K/uL   RBC 4.00 3.87 - 5.11 MIL/uL   Hemoglobin 11.5 (L) 12.0 - 15.0 g/dL   HCT 36.1 36.0 - 46.0 %   MCV 90.3 80.0 - 100.0 fL   MCH 28.8 26.0 - 34.0 pg   MCHC 31.9 30.0 - 36.0 g/dL   RDW 14.5 11.5 - 15.5 %   Platelets 323 150 - 400 K/uL   nRBC 0.0 0.0 - 0.2 %  Troponin I (High Sensitivity)  Result Value Ref Range   Troponin I (High Sensitivity) 5 <18 ng/L  Troponin I (High Sensitivity)  Result Value Ref Range   Troponin I (High Sensitivity) 7 <18 ng/L   Dg Chest 2 View  Result Date: 10/17/2018 CLINICAL DATA:  Rapid heart rate and weakness EXAM: CHEST - 2 VIEW COMPARISON:  09/19/2018 FINDINGS: Mild cardiomegaly. No pleural effusion or consolidation. No pneumothorax. IMPRESSION: No active cardiopulmonary disease.  Mild cardiomegaly. Electronically Signed   By: Donavan Foil M.D.   On: 10/17/2018 20:36  EKG EKG Interpretation  Date/Time:  Saturday October 17 2018 19:11:20 EDT Ventricular Rate:  77 PR Interval:  184 QRS Duration: 94 QT Interval:  372 QTC Calculation: 420 R Axis:   2 Text Interpretation:  Normal sinus rhythm Nonspecific ST abnormality No significant change since last tracing Confirmed by Lajean Saver [phone]) on 10/17/2018 8:34:54 PM   Radiology Dg Chest 2 View  Result Date: 10/17/2018 CLINICAL  DATA:  Rapid heart rate and weakness EXAM: CHEST - 2 VIEW COMPARISON:  09/19/2018 FINDINGS: Mild cardiomegaly. No pleural effusion or consolidation. No pneumothorax. IMPRESSION: No active cardiopulmonary disease.  Mild cardiomegaly. Electronically Signed   By: Donavan Foil M.D.   On: 10/17/2018 20:36    Procedures Procedures (including critical care time)  Medications Ordered in ED Medications  sodium chloride flush (NS) 0.9 % injection 3 mL (has no administration in time range)  LORazepam (ATIVAN) tablet 1 mg (has no administration in time range)     Initial Impression / Assessment and Plan / ED Course  I have reviewed the triage vital signs and the nursing notes.  Pertinent labs & imaging results that were available during my care of the patient were reviewed by me and considered in my medical decision making (see chart for details).  Labs sent. Monitor. Ecg.   Reviewed nursing notes and prior charts for additional history.   Ativan 1 mg po.  Labs reviewed by me - k low, 2.8. kcl iv and po. Initial trop normal.  cxr reviewed by me - no pna.  Recheck pt, calm and alert. No distress. nsr on monitor. No chest pain or discomfort.   Delta trop is normal.  Patient currently asymptomatic and appears stable for d/c.   rx kcl. rec pcp f/u. Return precautions provided.     Final Clinical Impressions(s) / ED Diagnoses   Final diagnoses:  None    ED Discharge Orders    None       Lajean Saver, MD 10/17/18 2237

## 2018-10-17 NOTE — ED Notes (Signed)
Patient transported to X-ray 

## 2018-10-17 NOTE — Discharge Instructions (Addendum)
It was our pleasure to provide your ER care today - we hope that you feel better.  From today's lab tests, your potassium level is low (2.8) - take potassium supplement as prescribed, eat plenty of fruits and vegetables, and follow up with primary care doctor in the coming week.   You may try taking vistaril as need for anxiety - may make drowsy, no driving when taking.   Follow up with primary care doctor in the coming week.   Return to ER if worse, new symptoms, fevers, persistent fast heart beat, chest pain, trouble breathing, or other concern.  You were given medication in the ER - no driving for the next 6 hours.

## 2018-10-18 NOTE — ED Notes (Signed)
Patient verbalizes understanding of discharge instructions. Opportunity for questioning and answers were provided. Armband removed by staff, Pt discharged from ED.

## 2018-10-19 ENCOUNTER — Ambulatory Visit: Payer: Self-pay

## 2018-10-19 ENCOUNTER — Encounter: Payer: Self-pay | Admitting: Physician Assistant

## 2018-10-19 ENCOUNTER — Telehealth: Payer: Self-pay | Admitting: Internal Medicine

## 2018-10-19 NOTE — Telephone Encounter (Signed)
Appt scheduled

## 2018-10-19 NOTE — Telephone Encounter (Signed)
Pt. Reports she was in the ED with low potassium this past weekend. Reports she was told to hold her potassium just last week. "I'm frustrated because my potassium goes up and down." Feels lightheaded and has no appetite. Warm transfer to [facility] in the practice for a visit.  Answer Assessment - Initial Assessment Questions 1. DESCRIPTION: "Describe your dizziness."     Lightheaded 2. LIGHTHEADED: "Do you feel lightheaded?" (e.g., somewhat faint, woozy, weak upon standing)     Yes-  3. VERTIGO: "Do you feel like either you or the room is spinning or tilting?" (i.e. vertigo)     No 4. SEVERITY: "How bad is it?"  "Do you feel like you are going to faint?" "Can you stand and walk?"   - MILD - walking normally   - MODERATE - interferes with normal activities (e.g., work, school)    - SEVERE - unable to stand, requires support to walk, feels like passing out now.      Moderate 5. ONSET:  "When did the dizziness begin?"     This weekend 6. AGGRAVATING FACTORS: "Does anything make it worse?" (e.g., standing, change in head position)     Changing positions 7. HEART RATE: "Can you tell me your heart rate?" "How many beats in 15 seconds?"  (Note: not all patients can do this)       No 8. CAUSE: "What do you think is causing the dizziness?"     My potassium 9. RECURRENT SYMPTOM: "Have you had dizziness before?" If so, ask: "When was the last time?" "What happened that time?"     Yes 10. OTHER SYMPTOMS: "Do you have any other symptoms?" (e.g., fever, chest pain, vomiting, diarrhea, bleeding)       No 11. PREGNANCY: "Is there any chance you are pregnant?" "When was your last menstrual period?"       No  Protocols used: DIZZINESS [facility]

## 2018-10-19 NOTE — Telephone Encounter (Signed)
Called and spoke with pt and she states she scheduled an appt with Dr. Hilarie Fredrickson. States she did not have any other issues. Apologized for bothering her.

## 2018-10-20 NOTE — Progress Notes (Addendum)
[facility] [address], [place], [place] [address] [phone]  Date:  10/21/2018   Name:  Stephanie Parks   DOB:  03/25/1945   MRN:  [id number]  PCP:  [name], MD    Chief Complaint: ER follow up (anixety, weakness, feeling off and on better, feels frustrated)   History of Present Illness:  Stephanie Parks is a 73 y.o. very pleasant female patient who presents with the following:  Following up from recent ER visit today She was seen in the ER on 8/29 with anxiety and feeling weak.   Her potassium was noted to be low and was repleted She was given potassium segmentation per the ER, is still taking it.  (I had seen her last week also for ER follow-up. She had been seen in the ER with HTN and hypkalemia.  We stopped her HCTZ, put her on plain losartan at that time.  Her K was ok at labs on 8/24)  She notes "this pressure on the left side of my head" that will come and go She notes that she felt lightheaded off and on.  She is not sure if this may be anxiety related  She saw Dr. Jaynee Eagles for this strange feeling in her head earlier this summer They did an MRI for her which looked okay And thought that her headaches were likely migraine related  She did get an rx for hydroxyzine for anxiety at the ER. It seems to just make her feel sleepy  She notes that she has felt "strange" since this spring/ April  She did start on crestor in the spring - wonders if this might contribute to her symptoms.  She feels like she for started feeling poorly after she started Crestor She is not fasting this am  She is taking currently losartan 100 mg and seems to be toleraring it well Her SBP was 134 before she left home this morning   Stephanie Parks admits that she is getting frustrated by some of her symptoms.  She is not sure if her symptoms are organic, or if they could be anxiety related     Patient Active Problem List   Diagnosis Date Noted  . History of chest pain 07/20/2018  . History of palpitations 07/20/2018  . Brain aneurysm 07/20/2018  . Bronchitis 04/23/2018  . Tendonitis, Achilles, right 12/08/2017  . Onychomycosis 12/08/2017  . Unilateral primary osteoarthritis, left knee 10/07/2017  . Ingrown toenail 08/10/2017  . Coughing 04/24/2017  . CAD (coronary artery disease) 03/01/2015  . Aneurysm ([place]) 03/01/2015  . Dizziness 03/01/2015  . Hypercholesterolemia 03/01/2015  . Decreased potassium in the blood 03/01/2015  . Paresthesia of arm 03/01/2015  . Allergic rhinitis 03/01/2015  . Achalasia 09/28/2012  . CN (constipation) 09/28/2012  . Cephalalgia 08/24/2012  . HTN (hypertension) 04/19/2011  . Hyperlipidemia 04/19/2011  . GERD (gastroesophageal reflux disease) 04/19/2011  . Chest pain 04/04/2011    Past Medical History:  Diagnosis Date  . 3-vessel CAD 03/01/2015  . Achalasia 09/28/2012  . Allergic rhinitis 03/01/2015  . Aneurysm ([place]) 03/01/2015  . BP (high blood pressure) 03/01/2015  . Bronchitis 04/23/2018  . Cephalalgia 08/24/2012   Overview:  ICD-10 cut over    . Chest pain 04/04/2011  . CN (constipation) 09/28/2012  . Coughing 04/24/2017  . Decreased potassium in the blood 03/01/2015  . Diverticulosis   . Dizziness 03/01/2015  . GERD (gastroesophageal reflux disease)   .  Hiatal hernia   . Hypercholesterolemia 03/01/2015  . Hyperlipidemia   . Hypertension   . Ingrown toenail 08/10/2017  . Inguinal hernia   . Migraine headache   . Onychomycosis 12/08/2017  . Paresthesia of arm 03/01/2015  . Schatzki's ring   . Tendonitis, Achilles, right 12/08/2017  . Unilateral primary osteoarthritis, left knee 10/07/2017    Past Surgical History:  Procedure Laterality Date  . ABDOMINAL HYSTERECTOMY    . BACK SURGERY     x 3  . CARPAL TUNNEL RELEASE     left wrist  . CERVICAL SPINE SURGERY    . HEMORRHOID SURGERY      Social History   Tobacco Use  . Smoking status: Former Smoker     Packs/day: 0.50    Years: 20.00    Pack years: 10.00    Types: Cigarettes    Quit date: 02/19/1984    Years since quitting: 34.6  . Smokeless tobacco: Never Used  Substance Use Topics  . Alcohol use: No  . Drug use: No    Family History  Problem Relation Age of Onset  . Allergic rhinitis Sister   . Diabetes Sister   . Hypertension Sister   . Heart attack Father 13  . Heart disease Father   . Stomach cancer Maternal Grandmother   . Heart disease Mother   . Colon cancer Neg Hx   . Angioedema Neg Hx   . Asthma Neg Hx   . Eczema Neg Hx   . Urticaria Neg Hx   . Immunodeficiency Neg Hx   . Breast cancer Neg Hx   . Migraines Neg Hx   . Headache Neg Hx     Allergies  Allergen Reactions  . Shellfish Allergy Hives and Swelling  . Ace Inhibitors Other (See Comments)    Pt cannot recall her reaction but tolerates arb   . Pitavastatin   . Sulfa Antibiotics Other (See Comments) and Rash    Fine bumps    Medication list has been reviewed and updated.  Current Outpatient Medications on File Prior to Visit  Medication Sig Dispense Refill  . acetaminophen-codeine (TYLENOL #3) 300-30 MG tablet Take 1-2 tablets by mouth every 8 (eight) hours as needed for moderate pain. 30 tablet 0  . alendronate (FOSAMAX) 10 MG tablet TAKE 1 TABLET BY MOUTH DAILY BEFORE BREAKFAST. TAKE WITH A FULL GLASS OF WATER ON EMPTY STOMACH 90 tablet 1  . aspirin 81 MG tablet Take 81 mg by mouth daily.    . Calcium Carbonate (CALCIUM 600 PO) Take 1 tablet by mouth daily.    . fluticasone (FLONASE) 50 MCG/ACT nasal spray USE 2 SPRAYS IN NOSTRIL(S) DAILY AS NEEDED. 16 g 9  . hydrOXYzine (ATARAX/VISTARIL) 25 MG tablet Take 1 tablet (25 mg total) by mouth every 8 (eight) hours as needed. 12 tablet 0  . levocetirizine (XYZAL) 5 MG tablet Take 1 tablet (5 mg total) by mouth every evening. 30 tablet 5  . losartan (COZAAR) 50 MG tablet Take 1-2 tablets (50-100 mg total) by mouth daily. For blood pressure 180 tablet 3  .  Multiple Vitamins-Minerals (MULTIVITAL) tablet Take by mouth.    . NONFORMULARY OR COMPOUNDED ITEM Allergen immunotherapy 2 each 1  . pantoprazole (PROTONIX) 40 MG tablet TAKE 1 TABLET (40 MG TOTAL) BY MOUTH DAILY. TAKE 30-60 MINUTES BEFORE BREAKFAST. 90 tablet 0  . potassium chloride SA (K-DUR) 20 MEQ tablet One (1) po bid x 3 days, then one (1) po once a day 20  tablet 0  . rosuvastatin (CRESTOR) 10 MG tablet Take 1 tablet (10 mg total) by mouth daily. 90 tablet 3  . senna (SENOKOT) 8.6 MG tablet Take 1 tablet by mouth 2 (two) times a day.     No current facility-administered medications on file prior to visit.     Review of Systems:  As per HPI- otherwise negative. No fever or chills  Physical Examination: Vitals:   10/21/18 1043  BP: 128/80  Pulse: 78  Resp: 16  Temp: (!) 97.2 F (36.2 C)  SpO2: 97%   Vitals:   10/21/18 1043  Weight: 215 lb (97.5 kg)  Height: 5\' 5"  (1.651 m)   Body mass index is 35.78 kg/m. Ideal Body Weight: Weight in (lb) to have BMI = 25: 149.9  GEN: WDWN, NAD, Non-toxic, A & O x 3, obese, looks well HEENT: Atraumatic, Normocephalic. Neck supple. No masses, No LAD.  Bilateral TM within normal limits Ears and Nose: No external deformity. CV: RRR, No M/G/R. No JVD. No thrill. No extra heart sounds. PULM: CTA B, no wheezes, crackles, rhonchi. No retractions. No resp. distress. No accessory muscle use. ABD: S, NT, ND, +BS. No rebound. No HSM. EXTR: No c/c/e NEURO Normal gait.  Moves all limbs normally, no slurred speech or facial drooping PSYCH: Normally interactive. Conversant. Not depressed or anxious appearing.  Calm demeanor.    Assessment and Plan: Hypokalemia - Plan: Basic metabolic panel  Essential hypertension  Hospital discharge follow-up  Screening for diabetes mellitus - Plan: Hemoglobin A1c  Anxiety - Plan: TSH  Following up today for a second ER visit in 1 month for hypertension and hypokalemia.  I stopped her hydrochlorothiazide  diuretic in hopes that it would correct her hypokalemia, but it seems to be persistent We will check her levels today, consider low-dose of spironolactone if indicated  We will need to communicate with patient any case, she is currently taking potassium supplement We will check her A1c and thyroid today Mother does feel that her symptoms may have started around the time she began taking Crestor.  We will have her hold this medication for about 1 week, let me know if this makes a difference  If Crestor does not seem to be the culprit, consider starting medication for anxiety and depression such as an SSRI  Signed Lamar Blinks, MD   Received her results, message to patient A1c is stable at 5.9% Results for orders placed or performed in visit on [id number]  Basic metabolic panel  Result Value Ref Range   Sodium 141 135 - 145 mEq/L   Potassium 4.0 3.5 - 5.1 mEq/L   Chloride 105 96 - 112 mEq/L   CO2 30 19 - 32 mEq/L   Glucose, Bld 91 70 - 99 mg/dL   BUN 12 6 - 23 mg/dL   Creatinine, Ser 0.89 0.40 - 1.20 mg/dL   Calcium 9.4 8.4 - 10.5 mg/dL   GFR 75.22 >60.00 mL/min  Hemoglobin A1c  Result Value Ref Range   Hgb A1c MFr Bld 5.9 4.6 - 6.5 %  TSH  Result Value Ref Range   TSH 1.47 0.35 - 4.50 uIU/mL

## 2018-10-21 ENCOUNTER — Encounter: Payer: Self-pay | Admitting: Family Medicine

## 2018-10-21 ENCOUNTER — Ambulatory Visit (INDEPENDENT_AMBULATORY_CARE_PROVIDER_SITE_OTHER): Payer: Medicare Other | Admitting: Family Medicine

## 2018-10-21 ENCOUNTER — Other Ambulatory Visit: Payer: Self-pay

## 2018-10-21 VITALS — BP 128/80 | HR 78 | Temp 97.2°F | Resp 16 | Ht 65.0 in | Wt 215.0 lb

## 2018-10-21 DIAGNOSIS — I10 Essential (primary) hypertension: Secondary | ICD-10-CM | POA: Diagnosis not present

## 2018-10-21 DIAGNOSIS — Z09 Encounter for follow-up examination after completed treatment for conditions other than malignant neoplasm: Secondary | ICD-10-CM | POA: Diagnosis not present

## 2018-10-21 DIAGNOSIS — Z131 Encounter for screening for diabetes mellitus: Secondary | ICD-10-CM | POA: Diagnosis not present

## 2018-10-21 DIAGNOSIS — F419 Anxiety disorder, unspecified: Secondary | ICD-10-CM

## 2018-10-21 DIAGNOSIS — E876 Hypokalemia: Secondary | ICD-10-CM | POA: Diagnosis not present

## 2018-10-21 DIAGNOSIS — M5432 Sciatica, left side: Secondary | ICD-10-CM

## 2018-10-21 LAB — BASIC METABOLIC PANEL
BUN: 12 mg/dL (ref 6–23)
CO2: 30 mEq/L (ref 19–32)
Calcium: 9.4 mg/dL (ref 8.4–10.5)
Chloride: 105 mEq/L (ref 96–112)
Creatinine, Ser: 0.89 mg/dL (ref 0.40–1.20)
GFR: 75.22 mL/min (ref >60.00)
Glucose, Bld: 91 mg/dL (ref 70–99)
Potassium: 4 mEq/L (ref 3.5–5.1)
Sodium: 141 mEq/L (ref 135–145)

## 2018-10-21 LAB — TSH: TSH: 1.47 u[IU]/mL (ref 0.35–4.50)

## 2018-10-21 LAB — HEMOGLOBIN A1C: Hgb A1c MFr Bld: 5.9 % (ref 4.6–6.5)

## 2018-10-21 NOTE — Patient Instructions (Signed)
I will be in touch with your labs asap Your BP looks fine- continue losartan 100 mg daily It is possible that Crestor is contributing to your feeling not right.  Stop taking this med for about a week and let me know if you notice a change.  If you do NOT notice a change, we will have you go back on crestor and likely start a medication to help treat and prevent anxiety   Will check potassium, thyroid and blood sugar today

## 2018-10-23 ENCOUNTER — Ambulatory Visit (HOSPITAL_BASED_OUTPATIENT_CLINIC_OR_DEPARTMENT_OTHER)
Admission: RE | Admit: 2018-10-23 | Discharge: 2018-10-23 | Disposition: A | Discharge status: Home / Self Care | Payer: Medicare Other | Source: Ambulatory Visit | Attending: Family Medicine | Admitting: Family Medicine

## 2018-10-23 ENCOUNTER — Other Ambulatory Visit: Payer: Self-pay

## 2018-10-23 DIAGNOSIS — Z1231 Encounter for screening mammogram for malignant neoplasm of breast: Secondary | ICD-10-CM | POA: Diagnosis not present

## 2018-10-27 ENCOUNTER — Ambulatory Visit (HOSPITAL_BASED_OUTPATIENT_CLINIC_OR_DEPARTMENT_OTHER): Payer: Medicare Other

## 2018-10-27 MED ORDER — ACETAMINOPHEN-CODEINE #3 300-30 MG PO TABS: 1.0000 | ORAL_TABLET | Freq: Three times a day (TID) | ORAL | 0 refills | Status: DC | PRN

## 2018-10-27 MED FILL — ACETAMINOPHEN/COD #3 TABLET: 300-30 | 5 days supply | Qty: 30 | Fill #0

## 2018-10-30 ENCOUNTER — Ambulatory Visit (INDEPENDENT_AMBULATORY_CARE_PROVIDER_SITE_OTHER): Payer: Medicare Other | Admitting: Physician Assistant

## 2018-10-30 ENCOUNTER — Encounter: Payer: Self-pay | Admitting: Physician Assistant

## 2018-10-30 VITALS — BP 120/80 | HR 71 | Temp 98.2°F | Ht 65.0 in | Wt 215.0 lb

## 2018-10-30 DIAGNOSIS — K219 Gastro-esophageal reflux disease without esophagitis: Secondary | ICD-10-CM | POA: Diagnosis not present

## 2018-10-30 DIAGNOSIS — K5909 Other constipation: Secondary | ICD-10-CM

## 2018-10-30 MED ORDER — PANTOPRAZOLE SODIUM 40 MG PO TBEC: 40.0000 mg | DELAYED_RELEASE_TABLET | Freq: Every day | ORAL | 1 refills | Status: DC

## 2018-10-30 NOTE — Progress Notes (Signed)
Subjective:    Patient ID: Stephanie Parks, female    DOB: [birth year], 73 y.o.   MRN: [id number]  HPI Katerin is a pleasant 73 year old African-American female, known to Dr. Hilarie Fredrickson with history of chronic constipation and adenomatous colon polyps.  She is also had prior diverticulitis. She comes in today with complaints of worsening constipation, and a new sense of malodorous body odor.  She is also been having ongoing issues with lightheadedness and is waiting for appointment to see neurology. Patient had been prescribed Linzess but was unable to get this due to excessive cost.  She was then started on Senokot 2 p.o. at bedtime.  She says this was working fairly well and she was having bowel movements almost every day and was feeling pretty good.  A few weeks ago she started experiencing lightheadedness and just has not felt well and then started noticing an odd odor which she equates to a fecal odor. She stopped taking the Senokot wondering whether this was causing the lightheadedness, and is now going for several days without a bowel movement.  She is having some urge for bowel movement but says she is frequently unable to go. Also complaining of a lot of gas and indigestion intermittently. Labs on 10/17/2018 showed hemoglobin 11.5 hematocrit of 36.1 stable.  B12 folate and iron studies all within normal limits. CT of the abdomen and pelvis in February 2020 showed mild sigmoid diverticulitis. Last colonoscopy January 2020 with removal of 3 polyps were all tubular adenomas and also noted left-sided diverticulosis.  Review of Systems Pertinent positive and negative review of systems were noted in the above HPI section.  All other review of systems was otherwise negative.  Outpatient Encounter Medications as of 10/30/2018  Medication Sig  . acetaminophen-codeine (TYLENOL #3) 300-30 MG tablet Take 1-2 tablets by mouth every 8 (eight) hours as needed for moderate pain.  Marland Kitchen alendronate (FOSAMAX) 10  MG tablet TAKE 1 TABLET BY MOUTH DAILY BEFORE BREAKFAST. TAKE WITH A FULL GLASS OF WATER ON EMPTY STOMACH  . aspirin 81 MG tablet Take 81 mg by mouth daily.  . Calcium Carbonate (CALCIUM 600 PO) Take 1 tablet by mouth daily.  . fluticasone (FLONASE) 50 MCG/ACT nasal spray USE 2 SPRAYS IN NOSTRIL(S) DAILY AS NEEDED.  Marland Kitchen levocetirizine (XYZAL) 5 MG tablet Take 1 tablet (5 mg total) by mouth every evening.  Marland Kitchen losartan (COZAAR) 50 MG tablet Take 1-2 tablets (50-100 mg total) by mouth daily. For blood pressure  . Multiple Vitamins-Minerals (MULTIVITAL) tablet Take by mouth.  . NONFORMULARY OR COMPOUNDED ITEM Allergen immunotherapy  . pantoprazole (PROTONIX) 40 MG tablet Take 1 tablet (40 mg total) by mouth daily. Take 30-60 minutes before breakfast.  . potassium chloride SA (K-DUR) 20 MEQ tablet One (1) po bid x 3 days, then one (1) po once a day  . senna (SENOKOT) 8.6 MG tablet Take 1 tablet by mouth 2 (two) times a day.  . [DISCONTINUED] pantoprazole (PROTONIX) 40 MG tablet TAKE 1 TABLET (40 MG TOTAL) BY MOUTH DAILY. TAKE 30-60 MINUTES BEFORE BREAKFAST.  . [DISCONTINUED] hydrOXYzine (ATARAX/VISTARIL) 25 MG tablet Take 1 tablet (25 mg total) by mouth every 8 (eight) hours as needed.  . [DISCONTINUED] rosuvastatin (CRESTOR) 10 MG tablet Take 1 tablet (10 mg total) by mouth daily.   No facility-administered encounter medications on file as of 10/30/2018.    Allergies  Allergen Reactions  . Shellfish Allergy Hives and Swelling  . Ace Inhibitors Other (See Comments)  Pt cannot recall her reaction but tolerates arb   . Pitavastatin   . Sulfa Antibiotics Other (See Comments) and Rash    Fine bumps   Patient Active Problem List   Diagnosis Date Noted  . History of chest pain 07/20/2018  . History of palpitations 07/20/2018  . Brain aneurysm 07/20/2018  . Bronchitis 04/23/2018  . Tendonitis, Achilles, right 12/08/2017  . Onychomycosis 12/08/2017  . Unilateral primary osteoarthritis, left knee  10/07/2017  . Ingrown toenail 08/10/2017  . Coughing 04/24/2017  . CAD (coronary artery disease) 03/01/2015  . Aneurysm ([place]) 03/01/2015  . Dizziness 03/01/2015  . Hypercholesterolemia 03/01/2015  . Decreased potassium in the blood 03/01/2015  . Paresthesia of arm 03/01/2015  . Allergic rhinitis 03/01/2015  . Achalasia 09/28/2012  . CN (constipation) 09/28/2012  . Cephalalgia 08/24/2012  . HTN (hypertension) 04/19/2011  . Hyperlipidemia 04/19/2011  . GERD (gastroesophageal reflux disease) 04/19/2011  . Chest pain 04/04/2011   Social History   Socioeconomic History  . Marital status: Divorced    Spouse name: Not on file  . Number of children: 2  . Years of education: Not on file  . Highest education level: Some college, no degree  Occupational History  . Occupation: [employer]  . Financial resource strain: Not on file  . Food insecurity    Worry: Not on file    Inability: Not on file  . Transportation needs    Medical: Not on file    Non-medical: Not on file  Tobacco Use  . Smoking status: Former Smoker    Packs/day: 0.50    Years: 20.00    Pack years: 10.00    Types: Cigarettes    Quit date: 02/19/1984    Years since quitting: 34.7  . Smokeless tobacco: Never Used  Substance and Sexual Activity  . Alcohol use: No  . Drug use: No  . Sexual activity: Yes    Partners: Male  Lifestyle  . Physical activity    Days per week: Not on file    Minutes per session: Not on file  . Stress: Not on file  Relationships  . Social Herbalist on phone: Not on file    Gets together: Not on file    Attends religious service: Not on file    Active member of club or organization: Not on file    Attends meetings of clubs or organizations: Not on file    Relationship status: Not on file  . Intimate partner violence    Fear of current or ex partner: Not on file    Emotionally abused: Not on file    Physically abused: Not on file    Forced sexual activity:  Not on file  Other Topics Concern  . Not on file  Social History Narrative   Lives alone   Caffeine use: Coffee one cup daily   Right handed     Ms. Cullum family history includes Allergic rhinitis in her sister; Diabetes in her sister; Heart attack (age of onset: 77) in her father; Heart disease in her father and mother; Hypertension in her sister; Stomach cancer in her maternal grandmother.      Objective:    Vitals:   10/30/18 1031  BP: 120/80  Pulse: 71  Temp: 98.2 F (36.8 C)    Physical Exam Well-developed well-nourished older African-American female in no acute distress.   Weight, 215 BMI 35.7  HEENT; nontraumatic normocephalic, EOMI, PER R LA, sclera anicteric.  Oropharynx; not examined/mask/COVID Neck; supple, no JVD Cardiovascular; regular rate and rhythm with S1-S2, no murmur rub or gallop Pulmonary; Clear bilaterally Abdomen; soft, nontender, nondistended, no palpable mass or hepatosplenomegaly, bowel sounds are active Rectal; not done today Skin; benign exam, no jaundice rash or appreciable lesions Extremities; no clubbing cyanosis or edema skin warm and dry Neuro/Psych; alert and oriented x4, grossly nonfocal mood and affect appropriate       Assessment & Plan:   #66 73 year old African-American female with chronic constipation, with worsening after she stopped Senokot recently. 2 history of adenomatous colon polyps-up-to-date with colonoscopy just done January 2020 3.  Diverticulosis with history of diverticulitis February 2020 4.  GERD-controlled on pantoprazole 40 mg 5.  Mild anemia, no evidence of B12 folate or iron deficiency and hemoglobin stable at 11.5.  Plan; Continue pantoprazole 40 mg p.o. daily and antireflux diet. We will also give her a low gas diet today to review and eliminate any offending foods she may be eating regularly. Gas-X as needed Resume Senokot 2 p.o. at bedtime, patient was reassured this is very unlikely to be associated with  any lightheadedness or dizziness. We will also give her a MiraLAX bowel purge as she feels significantly obstipated at present.  She will do that this weekend and then resume the Senokot. Also up with Dr. Hilarie Fredrickson or myself as needed  Alfredia Ferguson PA-C 10/30/2018   Cc: [name], MD

## 2018-10-30 NOTE — Patient Instructions (Signed)
If you are age 73 or older, your body mass index should be between 23-30. Your Body mass index is 35.78 kg/m. If this is out of the aforementioned range listed, please consider follow up with your Primary Care Provider.  If you are age 7 or younger, your body mass index should be between 19-25. Your Body mass index is 35.78 kg/m. If this is out of the aformentioned range listed, please consider follow up with your Primary Care Provider.   We have sent the following medications to your pharmacy for you to pick up at your convenience: Protonix  I have recommended that you complete a bowel purge (to clean out your bowels). Please do the following: Purchase a bottle of Miralax over the counter as well as a box of 5 mg dulcolax tablets. Take 4 dulcolax tablets. Wait 1 hour. You will then drink 6-8 capfuls of Miralax mixed in an adequate amount of water/juice/gatorade (you may choose which of these liquids to drink) over the next 2-3 hours. You should expect results within 1 to 6 hours after completing the bowel purge. Then restart Senokot 2 at bedtime.  Use Gas-X as needed.  You have been given a Low Gas Diet.  You may cancel your appointment with Dr. Hilarie Fredrickson in October if feeling better.  Thank you for choosing me and [facility].   Amy Esterwood, PA-C

## 2018-11-09 ENCOUNTER — Encounter: Payer: Self-pay | Admitting: Medical

## 2018-11-09 ENCOUNTER — Telehealth: Payer: Self-pay | Admitting: Neurology

## 2018-11-09 ENCOUNTER — Ambulatory Visit (INDEPENDENT_AMBULATORY_CARE_PROVIDER_SITE_OTHER): Payer: Medicare Other | Admitting: Medical

## 2018-11-09 ENCOUNTER — Other Ambulatory Visit: Payer: Self-pay

## 2018-11-09 VITALS — BP 155/66

## 2018-11-09 DIAGNOSIS — G43009 Migraine without aura, not intractable, without status migrainosus: Secondary | ICD-10-CM

## 2018-11-09 MED ORDER — SUMATRIPTAN SUCCINATE 25 MG PO TABS: 25.0000 mg | ORAL_TABLET | ORAL | 0 refills | Status: DC | PRN

## 2018-11-09 NOTE — Telephone Encounter (Signed)
Pt states she is still having headaches and is wanting to know if the provider can call in something for her to alleviate the pain. Please advise.

## 2018-11-09 NOTE — Patient Instructions (Addendum)
Patient has history of headaches for which she has had extensive work-up by various MDs, former neurosurgeon and recently neurologist.  MRI with and without contrast came back negative for any acute finding.  Only chronic age-related changes.  Today by video visit appears to have normal neurologic exam.  Neurologist note states that if the studies were all negative then they would try migraine medication.  Patient declined that on the last visit with neurologist but she is willing to try medication presently.  Appointment with neurologist is scheduled for September 30.  Asked patient to call neurologist office and see if they would go ahead and prescribe migraine medication.  If they do not give her any prescription for migraine medication then she can feel the Imitrex tablet which I sent to her pharmacy today.  But did explain to her to rely on their opinion and prescription and not to fill my prescription if they provide her with an option.  Patient expressed understanding.  Follow-up as regular scheduled with PCP or myself if needed.  Asked patient to give me update if the neurologist sent in the prescription.

## 2018-11-09 NOTE — Telephone Encounter (Signed)
At last appointment she declined medication so we didn't really discuss it, I really would need to see her to discuss her choices of medication. So she can wait to see me on the 30th or see Amy sooner if she likes. In the meantime I think Imitrex is a perfectly fine medication to try. Thanks.

## 2018-11-09 NOTE — Progress Notes (Signed)
Subjective:    Patient ID: Stephanie Parks, female    DOB: [birth year], 73 y.o.   MRN: [id number]  HPI  Virtual Visit via Video Note  I connected with Stephanie Parks on 11/09/18 at  1:00 PM EDT by a video enabled telemedicine application and verified that I am speaking with the correct person using two identifiers.  Location: Patient: home Provider: office   I discussed the limitations of evaluation and management by telemedicine and the availability of in person appointments. The patient expressed understanding and agreed to proceed.  History of Present Illness:  Pt I with some mild frontal ha region. Pt states for this she has had imaging study ct scan and mri. She states has got light ha with some dizziness almost daily. Pt had been given tyelnol with codeine for this mild ha's. She states it makes her feel drowsy. Pt states ha is actually on temporal area. Pt 2 months ago had negative sed rate.  No current severe ha or any gross motor/sensory fxn deficits.  Pt also states had crestor dc in the past but there was no change.  She had work up for about 6 months.   I reviewed Neurologist July note today. They notified her mri negative but did not give med tx. Did explain they can see her on on 11-18-2018.    Pt mri ordered by Dr. Jaynee Eagles showed.   IMPRESSION: This MRI of the brain with and without contrast shows the following: 1.  Few scattered T2/flair hyperintense foci consistent with minimal chronic microvascular ischemic changes, typical for age.  None of the foci appears to be acute and they do not enhance. 2.  There is a normal enhancement pattern and no acute findings.  Dr. Jaynee Eagles last note A na P reads.    "A/P:  73 y.o. female here as requested by Copland, Gay Filler, MD for headaches.  Past medical history of stable right-sided anterior aneurysm, hypertension, bronchitis, chest pain, coughing, dizziness, hypercholesterolemia, hyperlipidemia, migraines.  She is a  former smoker half pack a day for 20 years, 10 pack years.  She has been seen by multiple physicians, specialists and ED without etiology. With the headaches she has light and sound sensitivity, they are unilateral, nausea, pounding, they sound migrainous and she has a history of migraines but need to evaluate for any other causes.   Stable anterior aneurysm that was reimaged by Dr. Christella Noa by CT angios 1 year ago this is remained stable for the past 5 years and has been monitored. Last aw Dr. Christella Noa a month ago. Has also been to eye doctor and primary care and cardiology.  MRI brain w/wo contrast due to concerning symptoms of  positional headaches,vision changes  to look for space occupying mass, chiari or intracranial hypertension (pseudotumor), csf leak.   If everything is negative we can try migraines treatment. She declines medication at this time."    exam General-no acute distress, pleasant, oriented. Lungs- on inspection lungs appear unlabored. Neck- no tracheal deviation or jvd on inspection. Neuro- gross motor function appears intact.   Assesment /plan  Patient has history of headaches for which she has had extensive work-up by various MDs, former neurosurgeon and recently neurologist.  MRI with and without contrast came back negative for any acute finding.  Only chronic age-related changes.  Today by video visit appears to have normal neurologic exam.  Neurologist note states that if the studies were all negative then they would try migraine medication.  Patient declined that on the last visit with neurologist but she is willing to try medication presently.  Appointment with neurologist is scheduled for September 30.  Asked patient to call neurologist office and see if they would go ahead and prescribe migraine medication.  If they do not give her any prescription for migraine medication then she can feel the Imitrex tablet which I sent to her pharmacy today.  But did explain to  her to rely on their opinion and prescription and not to fill my prescription if they provide her with an option.  Patient expressed understanding.  Follow-up as regular scheduled with PCP or myself if needed.  Asked patient to give me update if the neurologist sent in the prescription.  Mackie Pai, PA-C    I discussed the assessment and treatment plan with the patient. The patient was provided an opportunity to ask questions and all were answered. The patient agreed with the plan and demonstrated an understanding of the instructions.   The patient was advised to call back or seek an in-person evaluation if the symptoms worsen or if the condition fails to improve as anticipated.  I provided 25 +minutes of non-face-to-face time during this encounter.   Mackie Pai, PA-C     Review of Systems  Constitutional: Negative for chills, fatigue and fever.  Respiratory: Negative for cough, chest tightness, shortness of breath and wheezing.   Cardiovascular: Negative for chest pain and palpitations.  Gastrointestinal: Negative for abdominal pain.  Musculoskeletal: Negative for back pain.  Skin: Negative for rash.  Neurological: Positive for dizziness and headaches. Negative for speech difficulty, weakness and numbness.       When has ha will get.  Hematological: Negative for adenopathy. Does not bruise/bleed easily.  Psychiatric/Behavioral: Negative for behavioral problems and confusion.   Past Medical History:  Diagnosis Date  . 3-vessel CAD 03/01/2015  . Achalasia 09/28/2012  . Allergic rhinitis 03/01/2015  . Aneurysm ([place]) 03/01/2015  . BP (high blood pressure) 03/01/2015  . Bronchitis 04/23/2018  . Cephalalgia 08/24/2012   Overview:  ICD-10 cut over    . Chest pain 04/04/2011  . CN (constipation) 09/28/2012  . Coughing 04/24/2017  . Decreased potassium in the blood 03/01/2015  . Diverticulosis   . Dizziness 03/01/2015  . GERD (gastroesophageal reflux disease)   . Hiatal hernia   .  Hypercholesterolemia 03/01/2015  . Hyperlipidemia   . Hypertension   . Ingrown toenail 08/10/2017  . Inguinal hernia   . Migraine headache   . Onychomycosis 12/08/2017  . Paresthesia of arm 03/01/2015  . Schatzki's ring   . Tendonitis, Achilles, right 12/08/2017  . Unilateral primary osteoarthritis, left knee 10/07/2017     Social History   Socioeconomic History  . Marital status: Divorced    Spouse name: Not on file  . Number of children: 2  . Years of education: Not on file  . Highest education level: Some college, no degree  Occupational History  . Occupation: [employer]  . Financial resource strain: Not on file  . Food insecurity    Worry: Not on file    Inability: Not on file  . Transportation needs    Medical: Not on file    Non-medical: Not on file  Tobacco Use  . Smoking status: Former Smoker    Packs/day: 0.50    Years: 20.00    Pack years: 10.00    Types: Cigarettes    Quit date: 02/19/1984    Years since quitting:  34.7  . Smokeless tobacco: Never Used  Substance and Sexual Activity  . Alcohol use: No  . Drug use: No  . Sexual activity: Yes    Partners: Male  Lifestyle  . Physical activity    Days per week: Not on file    Minutes per session: Not on file  . Stress: Not on file  Relationships  . Social Herbalist on phone: Not on file    Gets together: Not on file    Attends religious service: Not on file    Active member of club or organization: Not on file    Attends meetings of clubs or organizations: Not on file    Relationship status: Not on file  . Intimate partner violence    Fear of current or ex partner: Not on file    Emotionally abused: Not on file    Physically abused: Not on file    Forced sexual activity: Not on file  Other Topics Concern  . Not on file  Social History Narrative   Lives alone   Caffeine use: Coffee one cup daily   Right handed     Past Surgical History:  Procedure Laterality Date  .  ABDOMINAL HYSTERECTOMY    . BACK SURGERY     x 3  . CARPAL TUNNEL RELEASE     left wrist  . CERVICAL SPINE SURGERY    . HEMORRHOID SURGERY      Family History  Problem Relation Age of Onset  . Allergic rhinitis Sister   . Diabetes Sister   . Hypertension Sister   . Heart attack Father 91  . Heart disease Father   . Stomach cancer Maternal Grandmother   . Heart disease Mother   . Colon cancer Neg Hx   . Angioedema Neg Hx   . Asthma Neg Hx   . Eczema Neg Hx   . Urticaria Neg Hx   . Immunodeficiency Neg Hx   . Breast cancer Neg Hx   . Migraines Neg Hx   . Headache Neg Hx     Allergies  Allergen Reactions  . Shellfish Allergy Hives and Swelling  . Ace Inhibitors Other (See Comments)    Pt cannot recall her reaction but tolerates arb   . Pitavastatin   . Sulfa Antibiotics Other (See Comments) and Rash    Fine bumps    Current Outpatient Medications on File Prior to Visit  Medication Sig Dispense Refill  . acetaminophen-codeine (TYLENOL #3) 300-30 MG tablet Take 1-2 tablets by mouth every 8 (eight) hours as needed for moderate pain. 30 tablet 0  . alendronate (FOSAMAX) 10 MG tablet TAKE 1 TABLET BY MOUTH DAILY BEFORE BREAKFAST. TAKE WITH A FULL GLASS OF WATER ON EMPTY STOMACH 90 tablet 1  . aspirin 81 MG tablet Take 81 mg by mouth daily.    . Calcium Carbonate (CALCIUM 600 PO) Take 1 tablet by mouth daily.    . fluticasone (FLONASE) 50 MCG/ACT nasal spray USE 2 SPRAYS IN NOSTRIL(S) DAILY AS NEEDED. 16 g 9  . levocetirizine (XYZAL) 5 MG tablet Take 1 tablet (5 mg total) by mouth every evening. 30 tablet 5  . losartan (COZAAR) 50 MG tablet Take 1-2 tablets (50-100 mg total) by mouth daily. For blood pressure 180 tablet 3  . Multiple Vitamins-Minerals (MULTIVITAL) tablet Take by mouth.    . NONFORMULARY OR COMPOUNDED ITEM Allergen immunotherapy 2 each 1  . pantoprazole (PROTONIX) 40 MG tablet Take 1 tablet (40  mg total) by mouth daily. Take 30-60 minutes before breakfast. 90  tablet 1  . potassium chloride SA (K-DUR) 20 MEQ tablet One (1) po bid x 3 days, then one (1) po once a day 20 tablet 0  . senna (SENOKOT) 8.6 MG tablet Take 1 tablet by mouth 2 (two) times a day.     No current facility-administered medications on file prior to visit.     BP (!) 155/66       Objective:   Physical Exam        Assessment & Plan:

## 2018-11-09 NOTE — Telephone Encounter (Signed)
Pt saw Dr. Mitchel Honour today. Per his note:  Asked patient to call neurologist office and see if they would go ahead and prescribe migraine medication.  If they do not give her any prescription for migraine medication then she can feel the Imitrex tablet which I sent to her pharmacy today.  But did explain to her to rely on their opinion and prescription and not to fill my prescription if they provide her with an option.  Patient expressed understanding.

## 2018-11-10 ENCOUNTER — Ambulatory Visit: Payer: Self-pay | Admitting: *Deleted

## 2018-11-10 NOTE — Telephone Encounter (Signed)
I called the pt and spoke with her. She was hesitant about the side effects of Imitrex, so she would prefer to move her appt up to Amy. She was r/s to [facility] 9/28 @ 9:00 am arrival 8:30. Pt will bring a mask. She verbalized appreciation for the call.

## 2018-11-10 NOTE — Progress Notes (Signed)
[place] at [facility] [address] [place], [place] [address] [phone]  Date:  11/12/2018   Name:  Stephanie Parks   DOB:  Dec 29, 1945   MRN:  [id number]  PCP:  [name], MD    Chief Complaint: Hypertension   History of Present Illness:  Stephanie Parks is a 73 y.o. very pleasant female patient who presents with the following:  Here today for blood pressure recheck/ ER follow-up  Last seen by myself on September 2 following her second ER visit in 1 month for hypertension and hypokalemia. At that time her potassium was in normal range, I asked her to continue potassium supplement for 3 more days and then stop. She saw her gastroenterologist on September 11 for chronic constipation Some my partner Percell Miller on 9/21 for concern of headache  She was actually in the ER again yesterday with concern of mildly elevated BP and neck pain:  Patient presents with lower extremity swelling.  Low suspicion for DVT.  Swelling is likely due to prolonged standing.  Headache is being worked up by her neurologist.  No red flag signs or symptoms.  Did not believe that emergent imaging is necessary.  Concerned that her headache may at least in part be due to her cervical radiculopathy.  Certainly her anxiety is contributing to her symptoms.  Will screen with basic labs.  Advised to start wearing compression stockings and keeping legs elevated.  Patient will follow-up with her primary physician tomorrow regarding blood pressure management.  Flu shot- pt declines  Need to check potassium and blood pressure today  She notes that she continues to be bothered by headaches and that tylenol #3 does not help, "it just puts me to sleep, I'm still having the same thing"  She is seeing neurology again this coming Monday - she saw them in July She was too afraid to try the imitrx she was prescribed due to potential side effects - it seems she told  neurology staff she was not taking it due to side effects, but in fact she never actually tried it.  She asks me what she should do about her headaches today   She is taking 2 losartan now = 100 mg   She does admit that she is feeling fairly anxious. She is not sleeping well She has felt anxious for 3-4 months- mostly she is worried about covid, wearing the mask makes he somewhat ansious as well  She is feeling sad about having her headaches   Patient Active Problem List   Diagnosis Date Noted  . History of chest pain 07/20/2018  . History of palpitations 07/20/2018  . Brain aneurysm 07/20/2018  . Bronchitis 04/23/2018  . Tendonitis, Achilles, right 12/08/2017  . Onychomycosis 12/08/2017  . Unilateral primary osteoarthritis, left knee 10/07/2017  . Ingrown toenail 08/10/2017  . Coughing 04/24/2017  . CAD (coronary artery disease) 03/01/2015  . Aneurysm ([place]) 03/01/2015  . Dizziness 03/01/2015  . Hypercholesterolemia 03/01/2015  . Decreased potassium in the blood 03/01/2015  . Paresthesia of arm 03/01/2015  . Allergic rhinitis 03/01/2015  . Achalasia 09/28/2012  . CN (constipation) 09/28/2012  . Cephalalgia 08/24/2012  . HTN (hypertension) 04/19/2011  . Hyperlipidemia 04/19/2011  . GERD (gastroesophageal reflux disease) 04/19/2011  . Chest pain 04/04/2011    Past Medical History:  Diagnosis Date  . 3-vessel CAD 03/01/2015  . Achalasia 09/28/2012  . Allergic rhinitis 03/01/2015  . Aneurysm ([place]) 03/01/2015  .  BP (high blood pressure) 03/01/2015  . Bronchitis 04/23/2018  . Cephalalgia 08/24/2012   Overview:  ICD-10 cut over    . Chest pain 04/04/2011  . CN (constipation) 09/28/2012  . Coughing 04/24/2017  . Decreased potassium in the blood 03/01/2015  . Diverticulosis   . Dizziness 03/01/2015  . GERD (gastroesophageal reflux disease)   . Hiatal hernia   . Hypercholesterolemia 03/01/2015  . Hyperlipidemia   . Hypertension   . Ingrown toenail 08/10/2017  . Inguinal hernia   .  Migraine headache   . Onychomycosis 12/08/2017  . Paresthesia of arm 03/01/2015  . Schatzki's ring   . Tendonitis, Achilles, right 12/08/2017  . Unilateral primary osteoarthritis, left knee 10/07/2017    Past Surgical History:  Procedure Laterality Date  . ABDOMINAL HYSTERECTOMY    . BACK SURGERY     x 3  . CARPAL TUNNEL RELEASE     left wrist  . CERVICAL SPINE SURGERY    . HEMORRHOID SURGERY      Social History   Tobacco Use  . Smoking status: Former Smoker    Packs/day: 0.50    Years: 20.00    Pack years: 10.00    Types: Cigarettes    Quit date: 02/19/1984    Years since quitting: 34.7  . Smokeless tobacco: Never Used  Substance Use Topics  . Alcohol use: No  . Drug use: No    Family History  Problem Relation Age of Onset  . Allergic rhinitis Sister   . Diabetes Sister   . Hypertension Sister   . Heart attack Father 15  . Heart disease Father   . Stomach cancer Maternal Grandmother   . Heart disease Mother   . Colon cancer Neg Hx   . Angioedema Neg Hx   . Asthma Neg Hx   . Eczema Neg Hx   . Urticaria Neg Hx   . Immunodeficiency Neg Hx   . Breast cancer Neg Hx   . Migraines Neg Hx   . Headache Neg Hx     Allergies  Allergen Reactions  . Shellfish Allergy Hives and Swelling  . Ace Inhibitors Other (See Comments)    Pt cannot recall her reaction but tolerates arb   . Pitavastatin   . Sulfa Antibiotics Other (See Comments) and Rash    Fine bumps    Medication list has been reviewed and updated.  Current Outpatient Medications on File Prior to Visit  Medication Sig Dispense Refill  . acetaminophen-codeine (TYLENOL #3) 300-30 MG tablet Take 1-2 tablets by mouth every 8 (eight) hours as needed for moderate pain. 30 tablet 0  . alendronate (FOSAMAX) 10 MG tablet TAKE 1 TABLET BY MOUTH DAILY BEFORE BREAKFAST. TAKE WITH A FULL GLASS OF WATER ON EMPTY STOMACH 90 tablet 1  . aspirin 81 MG tablet Take 81 mg by mouth daily.    . Calcium Carbonate (CALCIUM 600  PO) Take 1 tablet by mouth daily.    . fluticasone (FLONASE) 50 MCG/ACT nasal spray USE 2 SPRAYS IN NOSTRIL(S) DAILY AS NEEDED. 16 g 9  . losartan (COZAAR) 50 MG tablet Take 1-2 tablets (50-100 mg total) by mouth daily. For blood pressure 180 tablet 3  . Multiple Vitamins-Minerals (MULTIVITAL) tablet Take by mouth.    . NONFORMULARY OR COMPOUNDED ITEM Allergen immunotherapy 2 each 1  . pantoprazole (PROTONIX) 40 MG tablet Take 1 tablet (40 mg total) by mouth daily. Take 30-60 minutes before breakfast. 90 tablet 1  . senna (SENOKOT) 8.6 MG tablet  Take 1 tablet by mouth 2 (two) times a day.     No current facility-administered medications on file prior to visit.     Review of Systems:  As per HPI- otherwise negative. No fever or chills   Physical Examination: Vitals:   11/12/18 1420 11/12/18 1439  BP: (!) 142/70 130/70  Pulse: 79   Resp: 16   Temp: (!) 97.5 F (36.4 C)   SpO2: 98%    Vitals:   11/12/18 1420  Weight: 214 lb (97.1 kg)  Height: 5\' 2"  (1.575 m)   Body mass index is 39.14 kg/m. Ideal Body Weight: Weight in (lb) to have BMI = 25: 136.4  GEN: WDWN, NAD, Non-toxic, A & O x 3, obese, looks well  HEENT: Atraumatic, Normocephalic. Neck supple. No masses, No LAD. Ears and Nose: No external deformity. CV: RRR, No M/G/R. No JVD. No thrill. No extra heart sounds. PULM: CTA B, no wheezes, crackles, rhonchi. No retractions. No resp. distress. No accessory muscle use. ABD: S, NT, ND, +BS. No rebound. No HSM. EXTR: No c/c/e NEURO Normal gait.  PSYCH: Normally interactive. Conversant. Not depressed or anxious appearing.  Calm demeanor.    Assessment and Plan: GAD (generalized anxiety disorder) - Plan: sertraline (ZOLOFT) 25 MG tablet  Migraine without aura and without status migrainosus, not intractable  Essential hypertension  Hospital discharge follow-up  Following up today after another ER visit yesterday.   Reassured Stephanie Parks that a SBP of 150 is not acutely  dangerous.  Her BP today looks great Continue current dose of losartan Address minimally low K with diet- gave her a list of high K foods today Suspect anxiety is playing a role- start on sertraline 25 mg She is now willing to try the imitrex which neurology suggested- will rx today  Neurology follow-up next week   Signed Lamar Blinks, MD

## 2018-11-10 NOTE — Telephone Encounter (Signed)
Having elevation b/p for several days with dizziness. On going  since her b/p medication was changed around. She has a neurologist appointment on the 64 th.  She has taken Tylenol for headache. Stating this is not a bad headache but she knows that it is a blood pressure one. She has taking her medication. She is requesting a visit with her provider regarding changing her medication to a different one. She has an office visit for Thursday but is requesting to see her provider by tomorrow. [facility] at [facility] notified for an appointment and call transferred to the practice.

## 2018-11-11 ENCOUNTER — Other Ambulatory Visit: Payer: Self-pay

## 2018-11-11 ENCOUNTER — Ambulatory Visit: Payer: Self-pay

## 2018-11-11 ENCOUNTER — Encounter (HOSPITAL_BASED_OUTPATIENT_CLINIC_OR_DEPARTMENT_OTHER): Payer: Self-pay | Admitting: Emergency Medicine

## 2018-11-11 ENCOUNTER — Emergency Department (HOSPITAL_BASED_OUTPATIENT_CLINIC_OR_DEPARTMENT_OTHER)
Admission: EM | Admit: 2018-11-11 | Discharge: 2018-11-11 | Disposition: A | Discharge status: Home / Self Care | Payer: Medicare Other | Attending: Emergency Medicine | Admitting: Emergency Medicine

## 2018-11-11 DIAGNOSIS — R2240 Localized swelling, mass and lump, unspecified lower limb: Secondary | ICD-10-CM | POA: Diagnosis present

## 2018-11-11 DIAGNOSIS — Z87891 Personal history of nicotine dependence: Secondary | ICD-10-CM | POA: Insufficient documentation

## 2018-11-11 DIAGNOSIS — R600 Localized edema: Secondary | ICD-10-CM | POA: Insufficient documentation

## 2018-11-11 DIAGNOSIS — Z7982 Long term (current) use of aspirin: Secondary | ICD-10-CM | POA: Diagnosis not present

## 2018-11-11 DIAGNOSIS — Z79899 Other long term (current) drug therapy: Secondary | ICD-10-CM | POA: Diagnosis not present

## 2018-11-11 DIAGNOSIS — R51 Headache: Secondary | ICD-10-CM | POA: Diagnosis not present

## 2018-11-11 DIAGNOSIS — I259 Chronic ischemic heart disease, unspecified: Secondary | ICD-10-CM | POA: Diagnosis not present

## 2018-11-11 DIAGNOSIS — I10 Essential (primary) hypertension: Secondary | ICD-10-CM | POA: Insufficient documentation

## 2018-11-11 DIAGNOSIS — G43909 Migraine, unspecified, not intractable, without status migrainosus: Secondary | ICD-10-CM | POA: Diagnosis not present

## 2018-11-11 DIAGNOSIS — G8929 Other chronic pain: Secondary | ICD-10-CM

## 2018-11-11 DIAGNOSIS — R030 Elevated blood-pressure reading, without diagnosis of hypertension: Secondary | ICD-10-CM | POA: Diagnosis not present

## 2018-11-11 DIAGNOSIS — R609 Edema, unspecified: Secondary | ICD-10-CM

## 2018-11-11 LAB — CBC WITH DIFFERENTIAL/PLATELET
Abs Immature Granulocytes: 0.02 10*3/uL (ref 0.00–0.07)
Basophils Absolute: 0 10*3/uL (ref 0.0–0.1)
Basophils Relative: 0 %
Eosinophils Absolute: 0.3 10*3/uL (ref 0.0–0.5)
Eosinophils Relative: 4 %
HCT: 34.5 % — ABNORMAL LOW (ref 36.0–46.0)
Hemoglobin: 10.9 g/dL — ABNORMAL LOW (ref 12.0–15.0)
Immature Granulocytes: 0 %
Lymphocytes Relative: 35 %
Lymphs Abs: 2.8 10*3/uL (ref 0.7–4.0)
MCH: 28.5 pg (ref 26.0–34.0)
MCHC: 31.6 g/dL (ref 30.0–36.0)
MCV: 90.1 fL (ref 80.0–100.0)
Monocytes Absolute: 0.5 10*3/uL (ref 0.1–1.0)
Monocytes Relative: 6 %
Neutro Abs: 4.4 10*3/uL (ref 1.7–7.7)
Neutrophils Relative %: 55 %
Platelets: 306 10*3/uL (ref 150–400)
RBC: 3.83 MIL/uL — ABNORMAL LOW (ref 3.87–5.11)
RDW: 15 % (ref 11.5–15.5)
WBC: 8 10*3/uL (ref 4.0–10.5)
nRBC: 0 % (ref 0.0–0.2)

## 2018-11-11 LAB — BASIC METABOLIC PANEL
Anion gap: 12 (ref 5–15)
BUN: 9 mg/dL (ref 8–23)
CO2: 28 mmol/L (ref 22–32)
Calcium: 9.1 mg/dL (ref 8.9–10.3)
Chloride: 101 mmol/L (ref 98–111)
Creatinine, Ser: 0.82 mg/dL (ref 0.44–1.00)
GFR calc Af Amer: >60 mL/min (ref >60)
GFR calc non Af Amer: >60 mL/min (ref >60)
Glucose, Bld: 104 mg/dL — ABNORMAL HIGH (ref 70–99)
Potassium: 3.3 mmol/L — ABNORMAL LOW (ref 3.5–5.1)
Sodium: 141 mmol/L (ref 135–145)

## 2018-11-11 MED ORDER — ACETAMINOPHEN 325 MG PO TABS
650.0000 mg | ORAL_TABLET | ORAL | Status: DC | PRN
  Administered 2018-11-11: 650 mg via ORAL
  Filled 2018-11-11: qty 2

## 2018-11-11 MED ORDER — METHOCARBAMOL 500 MG PO TABS
500.0000 mg | ORAL_TABLET | Freq: Once | ORAL | Status: AC
  Administered 2018-11-11: 16:00:00 500 mg via ORAL
  Filled 2018-11-11: qty 1

## 2018-11-11 NOTE — Telephone Encounter (Signed)
No availabilty with provider today-patient made aware and ok to wait until thursday.

## 2018-11-11 NOTE — Progress Notes (Addendum)
TOC CM chart review, 5 ED visits in past six months. [facility] consulted.   [place], [place] ED TOC CM [phone]

## 2018-11-11 NOTE — ED Provider Notes (Signed)
[place] EMERGENCY DEPARTMENT Provider Note   CSN: [id number] Arrival date & time: 11/11/18  1528     History   Chief Complaint Chief Complaint  Patient presents with  . Hypertension  . Leg Swelling    HPI Stephanie Parks is a 73 y.o. female.     HPI Patient presents with lower extremity swelling which she noticed today.  States she stands up a lot for her work.  Denied lower extremity pain.  Denied excessive salt intake.  No chest pain or shortness of breath.  Patient is also concerned that her blood pressure is elevated.  States at home blood pressure was 155/66.  Patient states she is been taking her blood pressure medication as prescribed.  Denies visual changes, focal weakness or numbness.  Patient states that she has ongoing left greater than right headache.  States the pain radiates from her neck.  This is unchanged.  She has been to see a neurologist and had MRI without acute findings.  Has known cervical degenerative disc disease. Past Medical History:  Diagnosis Date  . 3-vessel CAD 03/01/2015  . Achalasia 09/28/2012  . Allergic rhinitis 03/01/2015  . Aneurysm ([place]) 03/01/2015  . BP (high blood pressure) 03/01/2015  . Bronchitis 04/23/2018  . Cephalalgia 08/24/2012   Overview:  ICD-10 cut over    . Chest pain 04/04/2011  . CN (constipation) 09/28/2012  . Coughing 04/24/2017  . Decreased potassium in the blood 03/01/2015  . Diverticulosis   . Dizziness 03/01/2015  . GERD (gastroesophageal reflux disease)   . Hiatal hernia   . Hypercholesterolemia 03/01/2015  . Hyperlipidemia   . Hypertension   . Ingrown toenail 08/10/2017  . Inguinal hernia   . Migraine headache   . Onychomycosis 12/08/2017  . Paresthesia of arm 03/01/2015  . Schatzki's ring   . Tendonitis, Achilles, right 12/08/2017  . Unilateral primary osteoarthritis, left knee 10/07/2017    Patient Active Problem List   Diagnosis Date Noted  . History of chest pain 07/20/2018  . History of  palpitations 07/20/2018  . Brain aneurysm 07/20/2018  . Bronchitis 04/23/2018  . Tendonitis, Achilles, right 12/08/2017  . Onychomycosis 12/08/2017  . Unilateral primary osteoarthritis, left knee 10/07/2017  . Ingrown toenail 08/10/2017  . Coughing 04/24/2017  . CAD (coronary artery disease) 03/01/2015  . Aneurysm ([place]) 03/01/2015  . Dizziness 03/01/2015  . Hypercholesterolemia 03/01/2015  . Decreased potassium in the blood 03/01/2015  . Paresthesia of arm 03/01/2015  . Allergic rhinitis 03/01/2015  . Achalasia 09/28/2012  . CN (constipation) 09/28/2012  . Cephalalgia 08/24/2012  . HTN (hypertension) 04/19/2011  . Hyperlipidemia 04/19/2011  . GERD (gastroesophageal reflux disease) 04/19/2011  . Chest pain 04/04/2011    Past Surgical History:  Procedure Laterality Date  . ABDOMINAL HYSTERECTOMY    . BACK SURGERY     x 3  . CARPAL TUNNEL RELEASE     left wrist  . CERVICAL SPINE SURGERY    . HEMORRHOID SURGERY       OB History   No obstetric history on file.      Home Medications    Prior to Admission medications   Medication Sig Start Date End Date Taking? Authorizing Provider  acetaminophen-codeine (TYLENOL #3) 300-30 MG tablet Take 1-2 tablets by mouth every 8 (eight) hours as needed for moderate pain. 10/27/18   Copland, Gay Filler, MD  alendronate (FOSAMAX) 10 MG tablet TAKE 1 TABLET BY MOUTH DAILY BEFORE BREAKFAST. TAKE WITH A FULL GLASS OF WATER ON  EMPTY STOMACH 03/17/18   Copland, Gay Filler, MD  aspirin 81 MG tablet Take 81 mg by mouth daily.    [provider]  Calcium Carbonate (CALCIUM 600 PO) Take 1 tablet by mouth daily.    [provider]  fluticasone (FLONASE) 50 MCG/ACT nasal spray USE 2 SPRAYS IN NOSTRIL(S) DAILY AS NEEDED. 01/14/18   Copland, Gay Filler, MD  levocetirizine (XYZAL) 5 MG tablet Take 1 tablet (5 mg total) by mouth every evening. 07/20/18   Ann Held, DO  losartan (COZAAR) 50 MG tablet Take 1-2 tablets (50-100 mg  total) by mouth daily. For blood pressure 10/12/18   Copland, Gay Filler, MD  Multiple Vitamins-Minerals (MULTIVITAL) tablet Take by mouth.    [provider]  NONFORMULARY OR COMPOUNDED ITEM Allergen immunotherapy 03/01/15   Leda Roys, MD  pantoprazole (PROTONIX) 40 MG tablet Take 1 tablet (40 mg total) by mouth daily. Take 30-60 minutes before breakfast. 10/30/18   Esterwood, Amy S, PA-C  potassium chloride SA (K-DUR) 20 MEQ tablet One (1) po bid x 3 days, then one (1) po once a day 10/17/18   Lajean Saver, MD  senna (SENOKOT) 8.6 MG tablet Take 1 tablet by mouth 2 (two) times a day.    [provider]  SUMAtriptan (IMITREX) 25 MG tablet Take 1 tablet (25 mg total) by mouth every 2 (two) hours as needed for migraine. May repeat in 2 hours if headache persists or recurs. 11/09/18   Saguier, Percell Miller, PA-C    Family History Family History  Problem Relation Age of Onset  . Allergic rhinitis Sister   . Diabetes Sister   . Hypertension Sister   . Heart attack Father 47  . Heart disease Father   . Stomach cancer Maternal Grandmother   . Heart disease Mother   . Colon cancer Neg Hx   . Angioedema Neg Hx   . Asthma Neg Hx   . Eczema Neg Hx   . Urticaria Neg Hx   . Immunodeficiency Neg Hx   . Breast cancer Neg Hx   . Migraines Neg Hx   . Headache Neg Hx     Social History Social History   Tobacco Use  . Smoking status: Former Smoker    Packs/day: 0.50    Years: 20.00    Pack years: 10.00    Types: Cigarettes    Quit date: 02/19/1984    Years since quitting: 34.7  . Smokeless tobacco: Never Used  Substance Use Topics  . Alcohol use: No  . Drug use: No     Allergies   Shellfish allergy, Ace inhibitors, Pitavastatin, and Sulfa antibiotics   Review of Systems Review of Systems  Constitutional: Negative for chills and fever.  HENT: Negative for sore throat and trouble swallowing.   Eyes: Negative for photophobia and visual disturbance.  Respiratory: Negative  for cough, chest tightness and shortness of breath.   Cardiovascular: Positive for leg swelling. Negative for chest pain.  Gastrointestinal: Negative for abdominal pain, constipation, diarrhea, nausea and vomiting.  Genitourinary: Negative for dysuria, flank pain and frequency.  Musculoskeletal: Positive for neck pain. Negative for back pain, gait problem and neck stiffness.  Skin: Negative for rash and wound.  Neurological: Positive for headaches. Negative for dizziness, syncope, speech difficulty, weakness, light-headedness and numbness.  All other systems reviewed and are negative.    Physical Exam Updated Vital Signs BP (!) 145/74 (BP Location: Right Arm)   Pulse 69   Temp 98 F (36.7  C) (Oral)   Resp 16   Ht 5\' 2"  (1.575 m)   Wt 98.4 kg   SpO2 100%   BMI 39.69 kg/m   Physical Exam Vitals signs and nursing note reviewed.  Constitutional:      General: She is not in acute distress.    Appearance: Normal appearance. She is well-developed. She is not ill-appearing.  HENT:     Head: Normocephalic and atraumatic.     Comments: No facial asymmetry.  No temporal artery tenderness to palpation.    Nose: Nose normal.     Mouth/Throat:     Mouth: Mucous membranes are moist.  Eyes:     Extraocular Movements: Extraocular movements intact.     Pupils: Pupils are equal, round, and reactive to light.  Neck:     Musculoskeletal: Normal range of motion and neck supple. Muscular tenderness present. No neck rigidity.     Comments: Mild left trapezius and left paracervical muscular tenderness to palpation.  No posterior midline cervical tenderness to palpation.  No lymphadenopathy. Cardiovascular:     Rate and Rhythm: Normal rate and regular rhythm.     Heart sounds: No murmur. No friction rub. No gallop.   Pulmonary:     Effort: Pulmonary effort is normal. No respiratory distress.     Breath sounds: Normal breath sounds. No stridor. No wheezing, rhonchi or rales.  Chest:     Chest  wall: No tenderness.  Abdominal:     General: Bowel sounds are normal.     Palpations: Abdomen is soft.     Tenderness: There is no abdominal tenderness. There is no guarding or rebound.  Musculoskeletal: Normal range of motion.        General: Swelling present. No tenderness, deformity or signs of injury.     Right lower leg: No edema.     Left lower leg: No edema.     Comments: Mild bilateral lower extremity swelling without pitting edema.  No calf tenderness.  No asymmetry.  Distal pulses are 2+.  No obvious joint effusions.  No erythema or warmth.  Lymphadenopathy:     Cervical: No cervical adenopathy.  Skin:    General: Skin is warm and dry.     Findings: No erythema or rash.  Neurological:     General: No focal deficit present.     Mental Status: She is alert and oriented to person, place, and time.     Comments: 5/5 motor in all extremities.  Sensation fully intact.  Psychiatric:     Comments: Mildly anxious appearing      ED Treatments / Results  Labs (all labs ordered are listed, but only abnormal results are displayed) Labs Reviewed  CBC WITH DIFFERENTIAL/PLATELET - Abnormal; Notable for the following components:      Result Value   RBC 3.83 (*)    Hemoglobin 10.9 (*)    HCT 34.5 (*)    All other components within normal limits  BASIC METABOLIC PANEL - Abnormal; Notable for the following components:   Potassium 3.3 (*)    Glucose, Bld 104 (*)    All other components within normal limits    EKG EKG Interpretation  Date/Time:  Wednesday November 11 2018 15:50:51 EDT Ventricular Rate:  69 PR Interval:    QRS Duration: 94 QT Interval:  419 QTC Calculation: 449 R Axis:   -2 Text Interpretation:  Sinus rhythm Low voltage, precordial leads Confirmed by Julianne Rice [id number]) on 11/11/2018 5:48:46 PM   Radiology No  results found.  Procedures Procedures (including critical care time)  Medications Ordered in ED Medications  acetaminophen (TYLENOL)  tablet 650 mg (650 mg Oral Given 11/11/18 1624)  methocarbamol (ROBAXIN) tablet 500 mg (500 mg Oral Given 11/11/18 1623)     Initial Impression / Assessment and Plan / ED Course  I have reviewed the triage vital signs and the nursing notes.  Pertinent labs & imaging results that were available during my care of the patient were reviewed by me and considered in my medical decision making (see chart for details).        Patient presents with lower extremity swelling.  Low suspicion for DVT.  Swelling is likely due to prolonged standing.  Headache is being worked up by her neurologist.  No red flag signs or symptoms.  Did not believe that emergent imaging is necessary.  Concerned that her headache may at least in part be due to her cervical radiculopathy.  Certainly her anxiety is contributing to her symptoms.  Will screen with basic labs.  Advised to start wearing compression stockings and keeping legs elevated.  Patient will follow-up with her primary physician tomorrow regarding blood pressure management.  Final Clinical Impressions(s) / ED Diagnoses   Final diagnoses:  Elevated blood pressure reading  Peripheral edema  Chronic nonintractable headache, unspecified headache type    ED Discharge Orders    None       Julianne Rice, MD 11/11/18 1749

## 2018-11-11 NOTE — Telephone Encounter (Signed)
Pt. Reports she noticed swelling to both feet and ankles this morning. No pain, no shortness of breath or chest pain. Has an appointment for tomorrow. Asking to be seen today. Gwen in the practice reports there are no more openings today. Pt. Will elevate legs. Instructed if symptoms worsen to go to ED.  Answer Assessment - Initial Assessment Questions 1. ONSET: "When did the swelling start?" (e.g., minutes, hours, days)     This started this morning 2. LOCATION: "What part of the leg is swollen?"  "Are both legs swollen or just one leg?"     Both feet and ankles 3. SEVERITY: "How bad is the swelling?" (e.g., localized; mild, moderate, severe)  - Localized - small area of swelling localized to one leg  - MILD pedal edema - swelling limited to foot and ankle, pitting edema < 1/4 inch (6 mm) deep, rest and elevation eliminate most or all swelling  - MODERATE edema - swelling of lower leg to knee, pitting edema > 1/4 inch (6 mm) deep, rest and elevation only partially reduce swelling  - SEVERE edema - swelling extends above knee, facial or hand swelling present      Mild 4. REDNESS: "Does the swelling look red or infected?"     No 5. PAIN: "Is the swelling painful to touch?" If so, ask: "How painful is it?"   (Scale 1-10; mild, moderate or severe)     No 6. FEVER: "Do you have a fever?" If so, ask: "What is it, how was it measured, and when did it start?"      No 7. CAUSE: "What do you think is causing the leg swelling?"     Unsure 8. MEDICAL HISTORY: "Do you have a history of heart failure, kidney disease, liver failure, or cancer?"     HTN 9. RECURRENT SYMPTOM: "Have you had leg swelling before?" If so, ask: "When was the last time?" "What happened that time?"     No 10. OTHER SYMPTOMS: "Do you have any other symptoms?" (e.g., chest pain, difficulty breathing)       No 11. PREGNANCY: "Is there any chance you are pregnant?" "When was your last menstrual period?"       No  Protocols used:  LEG SWELLING AND EDEMA-A-AH

## 2018-11-11 NOTE — ED Triage Notes (Signed)
Patient here with c/o of hypertension 155/66 at home, and leg/feet swelling. Patient on BP medication that was changed about a month ago, patient stating she does not think the medication is working.

## 2018-11-12 ENCOUNTER — Other Ambulatory Visit: Payer: Self-pay

## 2018-11-12 ENCOUNTER — Ambulatory Visit (INDEPENDENT_AMBULATORY_CARE_PROVIDER_SITE_OTHER): Payer: Medicare Other | Admitting: Family Medicine

## 2018-11-12 ENCOUNTER — Encounter: Payer: Self-pay | Admitting: Family Medicine

## 2018-11-12 VITALS — BP 130/70 | HR 79 | Temp 97.5°F | Resp 16 | Ht 62.0 in | Wt 214.0 lb

## 2018-11-12 DIAGNOSIS — Z09 Encounter for follow-up examination after completed treatment for conditions other than malignant neoplasm: Secondary | ICD-10-CM | POA: Diagnosis not present

## 2018-11-12 DIAGNOSIS — I10 Essential (primary) hypertension: Secondary | ICD-10-CM

## 2018-11-12 DIAGNOSIS — F411 Generalized anxiety disorder: Secondary | ICD-10-CM | POA: Diagnosis not present

## 2018-11-12 DIAGNOSIS — G43009 Migraine without aura, not intractable, without status migrainosus: Secondary | ICD-10-CM | POA: Diagnosis not present

## 2018-11-12 MED ORDER — SUMATRIPTAN SUCCINATE 25 MG PO TABS: 25.0000 mg | ORAL_TABLET | Freq: Once | ORAL | 0 refills | Status: DC

## 2018-11-12 MED ORDER — SERTRALINE HCL 25 MG PO TABS: 25.0000 mg | ORAL_TABLET | Freq: Every day | ORAL | 6 refills | Status: DC

## 2018-11-12 NOTE — Patient Instructions (Addendum)
It was good to see you again today Your BP looks fine on losartan 100 mg Your potassium was just slightly low at the ER yesterday- please work on increasing your dietary potassium intake  For anxiety, let's start you on sertraline 25 mg a day.  Over the course of a few weeks this should help reduce your anxiety.  Let me know if any concerns.  We can go up on this med if needed  Please talk to your neurologist about your headaches on Monday.  You might gie the imitrex they suggested a try

## 2018-11-16 ENCOUNTER — Other Ambulatory Visit: Payer: Self-pay

## 2018-11-16 ENCOUNTER — Encounter: Payer: Self-pay | Admitting: Family Medicine

## 2018-11-16 ENCOUNTER — Other Ambulatory Visit: Payer: Self-pay | Admitting: *Deleted

## 2018-11-16 ENCOUNTER — Ambulatory Visit (INDEPENDENT_AMBULATORY_CARE_PROVIDER_SITE_OTHER): Payer: Medicare Other | Admitting: Family Medicine

## 2018-11-16 VITALS — BP 126/80 | HR 74 | Temp 97.7°F | Ht 65.0 in | Wt 214.8 lb

## 2018-11-16 DIAGNOSIS — R0683 Snoring: Secondary | ICD-10-CM | POA: Diagnosis not present

## 2018-11-16 DIAGNOSIS — R519 Headache, unspecified: Secondary | ICD-10-CM

## 2018-11-16 DIAGNOSIS — G43709 Chronic migraine without aura, not intractable, without status migrainosus: Secondary | ICD-10-CM

## 2018-11-16 DIAGNOSIS — I671 Cerebral aneurysm, nonruptured: Secondary | ICD-10-CM | POA: Diagnosis not present

## 2018-11-16 DIAGNOSIS — R51 Headache: Secondary | ICD-10-CM | POA: Diagnosis not present

## 2018-11-16 MED ORDER — TOPIRAMATE 25 MG PO TABS: 25.0000 mg | ORAL_TABLET | Freq: Two times a day (BID) | ORAL | 3 refills | Status: DC

## 2018-11-16 NOTE — Patient Outreach (Signed)
Telephone outreach for care management. Referred from Shaaron Adler from primary care office for frequency of ED visits, anxiety, HAs.  Called today and left a message to please return my call.  [name]. [name], MSN, [facility] Gerontological Nurse Practitioner [facility] Care Management [phone]

## 2018-11-16 NOTE — Progress Notes (Addendum)
PATIENT: Stephanie Parks DOB: [birth year]  REASON FOR VISIT: follow up HISTORY FROM: patient  Chief Complaint  Patient presents with   Follow-up    Migraine f/u. Alone. New room. Patinent mentioned that she currently has a headache.      HISTORY OF PRESENT ILLNESS: Today 11/16/18 Stephanie Parks is a 73 y.o. female here today for follow up of migraines. MRI unremarkable in 09/2018. She continues to have left sided tension type pain nearly everyday. It is pounding at times. She has light and sound sensitivity. Pain radiates to the back of her head and into her neck. She can have headache with or without neck pain. She has changed her pillow recently that has helped with neck pain. MR cervical spine in 07/2017 showed stenosis but no cord injury. She was seen in ER last week for elevated BP and leg swelling. She continues to check BP at home and reports readings are 140-150/60-70. She reports following up with PCP closely for this. She recently started on losartan. Her potassium was 3.3 at ER last week. She was also started on sertraline 25mg . She does feel that stress is making her headaches worse. She did not take sumatriptan due to concerns of having a reaction. She was given Tylenol # but reports that she felt dizzy and did not like how she felt. She takes Tylenol Arthritis 1-2 tablets maybe twice daily.   She works at [employer] with varying schedules. She gets about 5 hours of sleep at night. She lives alone. She has been told that she snores. She has woken at night from time to time coughing. She wakes twice nightly to urinate. She does not feel rested when she wakes. She does have morning headaches. Her sister has sleep apnea.    HISTORY: (copied from Dr Cathren Laine note on 09/02/2018)  HPI:  Stephanie Parks is a 73 y.o. female here as requested by Copland, Gay Filler, MD for headaches.  Past medical history of stable right-sided anterior aneurysm, hypertension, bronchitis, chest pain,  coughing, dizziness, hypercholesterolemia, hyperlipidemia, migraines.  She is a former smoker half pack a day for 20 years, 10 pack years.  Per emergency room notes, she was seen May 20, she has been frequenting multiple emergency rooms for evaluation but despite testing no specific diagnosis has been given to her.  In the emergency room in May she reported that the symptoms have been ongoing for months if not more, she gets pressure in her head and reports she feels like her left arm gets numb intermittently.  But no weakness or dropping things in the left. She went to the eye doctor yesterday and everything is fine, nothing unusual. She has neck tightness on the right and pain in her neck.   Headaches started 2-3 months. This is different than previous headaches. She feels pressure or like something is running in her head however the sensations of running on her head have stopped. She say Dr. Christella Noa a month ago and everthing is stable. She has pressure on the left side in the temple area. Sometimes in the right temple as well. Also the back of there neck. She has had cardiac evals, CT Angio of the brain, not an MRI. The headaches last 30 minutes to an hour. The last one was 3 days ago. 2-3x a week. She does not wake up with them. The light bothers her, a little lightheaded, nausea, sound sensitivity, can be pounding, 2 tylenols help. She had an emg/ncs last  year and it was unremarkable. She has episodic blurry vision with the headaches, painful can be very painful, positional worse with standing she has to lay down.   Reviewed notes, labs and imaging from outside physicians, which showed:  I reviewed emergency room notes.  Patient was in the emergency room Jul 16, 2018.  She reported pressure in her head that feels like water running down her head with left arm weakness on and off since February.  She has been seen numerous times with no diagnosis. She has been having headaches for months.  She has been  going to multiple emergency rooms for evaluation but despite testing no specific diagnosis has been given to her.  She gets brief pressure sensations around her temple on the left.  The last for several seconds at a time and come and go.  She also has some sensation of something running down her head.  No visual symptoms.  No double vision no vision loss.  No nausea or vomiting.  She also feels like her left arm gets numb intermittently this comes and goes.  She does not find that she is dropping things or weak to function.  Both of these symptoms have been several months in duration or more.  On exam she had left temple tenderness however left arm was normal and the rest of her neurologic exam and physical exam were also normal.  She was also seen by my colleague Dr. Jannifer Franklin for leg discomfort in the past.  EMG nerve conduction study was unremarkable.  She was seen for leg pain that is intermittent in nature and may hit one leg or the other lasting only a few seconds and going away.   REVIEW OF SYSTEMS: Out of a complete 14 system review of symptoms, the patient complains only of the following symptoms, dizziness, headache, numbness, weakness and all other reviewed systems are negative.  Epworth sleepiness scale: 8 Fatigue severity scale: 19  ALLERGIES: Allergies  Allergen Reactions   Shellfish Allergy Hives and Swelling   Ace Inhibitors Other (See Comments)    Pt cannot recall her reaction but tolerates arb    Pitavastatin    Sulfa Antibiotics Other (See Comments) and Rash    Fine bumps    HOME MEDICATIONS: Outpatient Medications Prior to Visit  Medication Sig Dispense Refill   acetaminophen-codeine (TYLENOL #3) 300-30 MG tablet Take 1-2 tablets by mouth every 8 (eight) hours as needed for moderate pain. 30 tablet 0   alendronate (FOSAMAX) 10 MG tablet TAKE 1 TABLET BY MOUTH DAILY BEFORE BREAKFAST. TAKE WITH A FULL GLASS OF WATER ON EMPTY STOMACH 90 tablet 1   aspirin 81 MG tablet  Take 81 mg by mouth daily.     Calcium Carbonate (CALCIUM 600 PO) Take 1 tablet by mouth daily.     fluticasone (FLONASE) 50 MCG/ACT nasal spray USE 2 SPRAYS IN NOSTRIL(S) DAILY AS NEEDED. 16 g 9   losartan (COZAAR) 50 MG tablet Take 1-2 tablets (50-100 mg total) by mouth daily. For blood pressure 180 tablet 3   Multiple Vitamins-Minerals (MULTIVITAL) tablet Take by mouth.     NONFORMULARY OR COMPOUNDED ITEM Allergen immunotherapy 2 each 1   pantoprazole (PROTONIX) 40 MG tablet Take 1 tablet (40 mg total) by mouth daily. Take 30-60 minutes before breakfast. 90 tablet 1   senna (SENOKOT) 8.6 MG tablet Take 1 tablet by mouth 2 (two) times a day.     sertraline (ZOLOFT) 25 MG tablet Take 1 tablet (25 mg total)  by mouth daily. 30 tablet 6   SUMAtriptan (IMITREX) 25 MG tablet Take 1 tablet (25 mg total) by mouth once for 1 dose. Max May repeat in 2 hours if headache persists or recurs. Max 100 mg per day 10 tablet 0   No facility-administered medications prior to visit.     PAST MEDICAL HISTORY: Past Medical History:  Diagnosis Date   3-vessel CAD 03/01/2015   Achalasia 09/28/2012   Allergic rhinitis 03/01/2015   Aneurysm ([place]) 03/01/2015   BP (high blood pressure) 03/01/2015   Bronchitis 04/23/2018   Cephalalgia 08/24/2012   Overview:  ICD-10 cut over     Chest pain 04/04/2011   CN (constipation) 09/28/2012   Coughing 04/24/2017   Decreased potassium in the blood 03/01/2015   Diverticulosis    Dizziness 03/01/2015   GERD (gastroesophageal reflux disease)    Hiatal hernia    Hypercholesterolemia 03/01/2015   Hyperlipidemia    Hypertension    Ingrown toenail 08/10/2017   Inguinal hernia    Migraine headache    Onychomycosis 12/08/2017   Paresthesia of arm 03/01/2015   Schatzki's ring    Tendonitis, Achilles, right 12/08/2017   Unilateral primary osteoarthritis, left knee 10/07/2017    PAST SURGICAL HISTORY: Past Surgical History:  Procedure Laterality Date     ABDOMINAL HYSTERECTOMY     BACK SURGERY     x 3   CARPAL TUNNEL RELEASE     left wrist   CERVICAL SPINE SURGERY     HEMORRHOID SURGERY      FAMILY HISTORY: Family History  Problem Relation Age of Onset   Allergic rhinitis Sister    Diabetes Sister    Hypertension Sister    Heart attack Father 54   Heart disease Father    Stomach cancer Maternal Grandmother    Heart disease Mother    Colon cancer Neg Hx    Angioedema Neg Hx    Asthma Neg Hx    Eczema Neg Hx    Urticaria Neg Hx    Immunodeficiency Neg Hx    Breast cancer Neg Hx    Migraines Neg Hx    Headache Neg Hx     SOCIAL HISTORY: Social History   Socioeconomic History   Marital status: Divorced    Spouse name: Not on file   Number of children: 2   Years of education: Not on file   Highest education level: Some college, no degree  Occupational History   Occupation: Doctor, general practice strain: Not on file   Food insecurity    Worry: Not on file    Inability: Not on file   Transportation needs    Medical: Not on file    Non-medical: Not on file  Tobacco Use   Smoking status: Former Smoker    Packs/day: 0.50    Years: 20.00    Pack years: 10.00    Types: Cigarettes    Quit date: 02/19/1984    Years since quitting: 34.7   Smokeless tobacco: Never Used  Substance and Sexual Activity   Alcohol use: No   Drug use: No   Sexual activity: Yes    Partners: Male  Lifestyle   Physical activity    Days per week: Not on file    Minutes per session: Not on file   Stress: Not on file  Relationships   Social connections    Talks on phone: Not on file    Gets together: Not on file  Attends religious service: Not on file    Active member of club or organization: Not on file    Attends meetings of clubs or organizations: Not on file    Relationship status: Not on file   Intimate partner violence    Fear of current or ex partner: Not on file     Emotionally abused: Not on file    Physically abused: Not on file    Forced sexual activity: Not on file  Other Topics Concern   Not on file  Social History Narrative   Lives alone   Caffeine use: Coffee one cup daily   Right handed       PHYSICAL EXAM  Vitals:   11/16/18 0843  BP: 126/80  Pulse: 74  Temp: 97.7 F (36.5 C)  TempSrc: Oral  Weight: 214 lb 12.8 oz (97.4 kg)  Height: 5\' 5"  (1.651 m)   Body mass index is 35.74 kg/m.  Generalized: Well developed, in no acute distress  Cardiology: normal rate and rhythm, no murmur noted Respiratory: Clear to auscultation bilaterally Neurological examination  Mentation: Alert oriented to time, place, history taking. Follows all commands speech and language fluent Cranial nerve II-XII: Pupils were equal round reactive to light. Extraocular movements were full, visual field were full on confrontational test. Facial sensation and strength were normal. Uvula tongue midline. Head turning and shoulder shrug  were normal and symmetric. Motor: The motor testing reveals 5 over 5 strength of all 4 extremities. Good symmetric motor tone is noted throughout.  Sensory: Sensory testing is intact to soft touch on all 4 extremities. No evidence of extinction is noted.  Coordination: Cerebellar testing reveals good finger-nose-finger and heel-to-shin bilaterally.  Gait and station: Gait is normal.   DIAGNOSTIC DATA (LABS, IMAGING, TESTING) - I reviewed patient records, labs, notes, testing and imaging myself where available.  No flowsheet data found.   Lab Results  Component Value Date   WBC 8.0 11/11/2018   HGB 10.9 (L) 11/11/2018   HCT 34.5 (L) 11/11/2018   MCV 90.1 11/11/2018   PLT 306 11/11/2018      Component Value Date/Time   NA 141 11/11/2018 1625   NA 144 07/28/2018 0756   K 3.3 (L) 11/11/2018 1625   CL 101 11/11/2018 1625   CO2 28 11/11/2018 1625   GLUCOSE 104 (H) 11/11/2018 1625   BUN 9 11/11/2018 1625   BUN 11  07/28/2018 0756   CREATININE 0.82 11/11/2018 1625   CALCIUM 9.1 11/11/2018 1625   PROT 7.1 09/19/2018 1447   PROT 6.2 07/28/2018 0756   ALBUMIN 3.7 09/19/2018 1447   ALBUMIN 4.0 07/28/2018 0756   AST 22 09/19/2018 1447   ALT 18 09/19/2018 1447   ALKPHOS 53 09/19/2018 1447   BILITOT 0.5 09/19/2018 1447   BILITOT 0.4 07/28/2018 0756   GFRNONAA >60 11/11/2018 1625   GFRAA >60 11/11/2018 1625   Lab Results  Component Value Date   CHOL 176 07/28/2018   HDL 73 07/28/2018   LDLCALC 93 07/28/2018   TRIG 51 07/28/2018   CHOLHDL 2.4 07/28/2018   Lab Results  Component Value Date   HGBA1C 5.9 10/21/2018   Lab Results  Component Value Date   [id number] 07/02/2018   Lab Results  Component Value Date   TSH 1.47 10/21/2018       ASSESSMENT AND PLAN 73 y.o. year old female  has a past medical history of 3-vessel CAD (03/01/2015), Achalasia (09/28/2012), Allergic rhinitis (03/01/2015), Aneurysm ([place]) (03/01/2015), BP (high  blood pressure) (03/01/2015), Bronchitis (04/23/2018), Cephalalgia (08/24/2012), Chest pain (04/04/2011), CN (constipation) (09/28/2012), Coughing (04/24/2017), Decreased potassium in the blood (03/01/2015), Diverticulosis, Dizziness (03/01/2015), GERD (gastroesophageal reflux disease), Hiatal hernia, Hypercholesterolemia (03/01/2015), Hyperlipidemia, Hypertension, Ingrown toenail (08/10/2017), Inguinal hernia, Migraine headache, Onychomycosis (12/08/2017), Paresthesia of arm (03/01/2015), Schatzki's ring, Tendonitis, Achilles, right (12/08/2017), and Unilateral primary osteoarthritis, left knee (10/07/2017). here with     ICD-10-CM   1. Chronic migraine without aura without status migrainosus, not intractable  G43.709 Ambulatory referral to Sleep Studies  2. Morning headache  R51 Ambulatory referral to Sleep Studies  3. Snoring  R06.83   4. Brain aneurysm  I67.1     Mrs. Burak continues to note headaches nearly every day.  She is frustrated as she has had multiple visits with  multiple providers and work-up has been unremarkable.  We have discussed adding a preventative medication, however, she is hesitant.  We have discussed multiple options and she feels that topiramate may be the better option.  We will start 25 mg at bedtime for 1 week then increase to 25 mg twice daily.  She was advised that we may continue to increase medication if this is beneficial. She is very hesitant about considering preventative medications due to side effects.  She will continue Tylenol as needed for abortive therapy.  She was advised against regular use.  We will also refer her to sleep medicine for work-up.  Some concerns for sleep apnea are obtained in history.  She does continue to follow Dr. Christella Noa for history of brain aneurysm.  She was advised to follow closely with primary care as well for chronic comorbidities.  She will follow-up with me in 3 months, sooner if needed.  She verbalizes understanding and agreement with this plan.   Orders Placed This Encounter  Procedures   Ambulatory referral to Sleep Studies    Referral Priority:   Routine    Referral Type:   Consultation    Referral Reason:   Specialty Services Required    Number of Visits Requested:   1     Meds ordered this encounter  Medications   topiramate (TOPAMAX) 25 MG tablet    Sig: Take 1 tablet (25 mg total) by mouth 2 (two) times daily.    Dispense:  60 tablet    Refill:  3    Order Specific Question:   Supervising Provider    Answer:   Melvenia Beam [id number]      I spent 15 minutes with the patient. 50% of this time was spent counseling and educating patient on plan of care and medications.   Made any corrections needed, and agree with history, physical, neuro exam,assessment and plan as stated.     Sarina Ill, MD [facility]   Wilberth Damon [place], FNP-C 11/16/2018, 12:29 PM [facility] [address] [place], [place] [address] [phone]

## 2018-11-16 NOTE — Patient Instructions (Addendum)
We will start topiramate 25mg  twice daily (for the first week, start topiramate 25mg  at bedtime, may increase to twice daily if well tolerated)  Continue Tylenol as needed, try to avoid regular use.   Sleep evaluation with sleep med  Follow up in 3 months, sooner if needed  Migraine Headache A migraine headache is a very strong throbbing pain on one side or both sides of your head. This type of headache can also cause other symptoms. It can last from 4 hours to 3 days. Talk with your doctor about what things may bring on (trigger) this condition. What are the causes? The exact cause of this condition is not known. This condition may be triggered or caused by:  Drinking alcohol.  Smoking.  Taking medicines, such as: ? Medicine used to treat chest pain (nitroglycerin). ? Birth control pills. ? Estrogen. ? Some blood pressure medicines.  Eating or drinking certain products.  Doing physical activity. Other things that may trigger a migraine headache include:  Having a menstrual period.  Pregnancy.  Hunger.  Stress.  Not getting enough sleep or getting too much sleep.  Weather changes.  Tiredness (fatigue). What increases the risk?  Being 22-1 years old.  Being female.  Having a family history of migraine headaches.  Being Caucasian.  Having depression or anxiety.  Being very overweight. What are the signs or symptoms?  A throbbing pain. This pain may: ? Happen in any area of the head, such as on one side or both sides. ? Make it hard to do daily activities. ? Get worse with physical activity. ? Get worse around bright lights or loud noises.  Other symptoms may include: ? Feeling sick to your stomach (nauseous). ? Vomiting. ? Dizziness. ? Being sensitive to bright lights, loud noises, or smells.  Before you get a migraine headache, you may get warning signs (an aura). An aura may include: ? Seeing flashing lights or having blind spots. ? Seeing  bright spots, halos, or zigzag lines. ? Having tunnel vision or blurred vision. ? Having numbness or a tingling feeling. ? Having trouble talking. ? Having weak muscles.  Some people have symptoms after a migraine headache (postdromal phase), such as: ? Tiredness. ? Trouble thinking (concentrating). How is this treated?  Taking medicines that: ? Relieve pain. ? Relieve the feeling of being sick to your stomach. ? Prevent migraine headaches.  Treatment may also include: ? Having acupuncture. ? Avoiding foods that bring on migraine headaches. ? Learning ways to control your body functions (biofeedback). ? Therapy to help you know and deal with negative thoughts (cognitive behavioral therapy). Follow these instructions at home: Medicines  Take over-the-counter and prescription medicines only as told by your doctor.  Ask your doctor if the medicine prescribed to you: ? Requires you to avoid driving or using heavy machinery. ? Can cause trouble pooping (constipation). You may need to take these steps to prevent or treat trouble pooping:  Drink enough fluid to keep your pee (urine) pale yellow.  Take over-the-counter or prescription medicines.  Eat foods that are high in fiber. These include beans, whole grains, and fresh fruits and vegetables.  Limit foods that are high in fat and sugar. These include fried or sweet foods. Lifestyle  Do not drink alcohol.  Do not use any products that contain nicotine or tobacco, such as cigarettes, e-cigarettes, and chewing tobacco. If you need help quitting, ask your doctor.  Get at least 8 hours of sleep every night.  Limit  and deal with stress. General instructions      Keep a journal to find out what may bring on your migraine headaches. For example, write down: ? What you eat and drink. ? How much sleep you get. ? Any change in what you eat or drink. ? Any change in your medicines.  If you have a migraine headache: ? Avoid  things that make your symptoms worse, such as bright lights. ? It may help to lie down in a dark, quiet room. ? Do not drive or use heavy machinery. ? Ask your doctor what activities are safe for you.  Keep all follow-up visits as told by your doctor. This is important. Contact a doctor if:  You get a migraine headache that is different or worse than others you have had.  You have more than 15 headache days in one month. Get help right away if:  Your migraine headache gets very bad.  Your migraine headache lasts longer than 72 hours.  You have a fever.  You have a stiff neck.  You have trouble seeing.  Your muscles feel weak or like you cannot control them.  You start to lose your balance a lot.  You start to have trouble walking.  You pass out (faint).  You have a seizure. Summary  A migraine headache is a very strong throbbing pain on one side or both sides of your head. These headaches can also cause other symptoms.  This condition may be treated with medicines and changes to your lifestyle.  Keep a journal to find out what may bring on your migraine headaches.  Contact a doctor if you get a migraine headache that is different or worse than others you have had.  Contact your doctor if you have more than 15 headache days in a month. This information is not intended to replace advice given to you by your health care provider. Make sure you discuss any questions you have with your health care provider. Document Released: 11/14/2007 Document Revised: 05/29/2018 Document Reviewed: 03/19/2018 Elsevier Patient Education  2020 [employer].

## 2018-11-18 ENCOUNTER — Ambulatory Visit: Payer: Medicare Other | Admitting: Neurology

## 2018-11-18 NOTE — Progress Notes (Signed)
Addendum: Reviewed and agree with assessment and management plan. Liborio Saccente M, MD  

## 2018-11-19 ENCOUNTER — Other Ambulatory Visit: Payer: Self-pay

## 2018-11-19 ENCOUNTER — Other Ambulatory Visit: Payer: Self-pay | Admitting: *Deleted

## 2018-11-19 NOTE — Patient Outreach (Signed)
Second telephone outreach per primary care referral for care management. Pt did not answer her phone and I left a voice mail to return my call.  I will call again on Monday.  Stephanie Parks. [name], MSN, [facility] Gerontological Nurse Practitioner [facility] [phone]

## 2018-11-20 ENCOUNTER — Telehealth: Payer: Self-pay | Admitting: Family Medicine

## 2018-11-20 NOTE — Telephone Encounter (Signed)
Pt called stating that the topiramate (TOPAMAX) 25 MG tablet is not working for her. Please advise.

## 2018-11-23 ENCOUNTER — Encounter: Payer: Self-pay | Admitting: Family Medicine

## 2018-11-23 ENCOUNTER — Other Ambulatory Visit: Payer: Self-pay | Admitting: *Deleted

## 2018-11-23 ENCOUNTER — Other Ambulatory Visit: Payer: Self-pay

## 2018-11-23 ENCOUNTER — Encounter: Payer: Self-pay | Admitting: *Deleted

## 2018-11-23 ENCOUNTER — Ambulatory Visit (INDEPENDENT_AMBULATORY_CARE_PROVIDER_SITE_OTHER): Payer: Medicare Other | Admitting: Family Medicine

## 2018-11-23 DIAGNOSIS — F411 Generalized anxiety disorder: Secondary | ICD-10-CM

## 2018-11-23 DIAGNOSIS — G43009 Migraine without aura, not intractable, without status migrainosus: Secondary | ICD-10-CM | POA: Diagnosis not present

## 2018-11-23 DIAGNOSIS — I10 Essential (primary) hypertension: Secondary | ICD-10-CM

## 2018-11-23 NOTE — Telephone Encounter (Signed)
I called pt and relayed per VM mobile that per AL/NP we can try other preventative or ubrelvy to see if they will help.  Amy offered to see you to reassess so need to make appt.  Also wanted to state that if she has elevated Bp to contact pcp, but also if concerning sx of weakness, vision changes, gait/ balance, speech changes, numbness to go to ED, or all 911 relating to aneurysm that she has.  Please return call.

## 2018-11-23 NOTE — Telephone Encounter (Signed)
Ok with plan as long as no worrisome symptoms as listed in previous message. She needs to seek urgent care if so. We can try another preventative or [name] if she would like but she refused anything other than topiramate in office. She is welcome to come in for a visit to reassess if she would feel better. TY.

## 2018-11-23 NOTE — Telephone Encounter (Signed)
I called pt and she is complaining of increased migraines, since prior to being seen.  Level 10 she states.  Took topiramate 25mg  po qhs, for 4 days, caused her BP to go up, heart race, worsening headaches.  She states that BP and heart racing is better since off.  Still with level 10 pain.  She may be out (use mobile if need to connect).  She asks if shot is available.  I relayed I did not know her history of what she has taken in past, but will let AL/NP know (her request).

## 2018-11-23 NOTE — Telephone Encounter (Signed)
I called pt back and she states that she has had migraine headache since the day she saw [place].  She has taken tylenol with arthritis which has not helped, tylenol with codiene her pcp has given her made her dizziness.  She said they are starting to make her depressed, light headed.  Pain is l side which goes back to her neck and r side. (all over).  Sound and light bother her.  I relayed that she could try tylenol 1000mg  with benadryl. She states she takes her Bp every morning.  Due to her hx she is not candidate for triptans or nsaids.

## 2018-11-23 NOTE — Telephone Encounter (Signed)
Pt called in and stated that she calling in Friday to advise that the Topamax is making her heart race, she states she has been having severe headaches and it has also raised her blood pressure. Pt is requesting something different to be called in to help her

## 2018-11-23 NOTE — Progress Notes (Signed)
[place] [facility] [address], [place], [place] [address] [phone]  Date:  11/23/2018   Name:  Stephanie Parks   DOB:  04-24-1945   MRN:  [id number]  PCP:  [name], MD    Chief Complaint: No chief complaint on file.   History of Present Illness:  Stephanie Parks is a 73 y.o. very pleasant female patient who presents with the following:  Virtual visit today Pt location is home, provider location is office Patient did not answer at the phone number that was provided, I tried her alternative number and she did not answer. Pt ID confirmed with 2 factors, she given consent  I saw her most recently on September 24 following her third ER visit in recent months for concern about high blood pressure At that visit her blood pressure looks fine, I had her continue current dose of losartan. We started her on sertraline 25 mg a day for anxiety.  She went to see her neurologist to discuss her headaches last week on 9/28 They put her on topamax but she did not like it- she took it for 5 days.  However it did not seem to help so she stopped taking it Her main concern today continues to be headaches.  She reports a daily headache.  She has been in contact with her neurologist over the last couple of days, they have made a few more suggestions.  I explained to her that I have asked neurology to consult for headaches as I do not have a good answer for her regarding them.  Asked her to continue to work with her neurologist to gain control of her headaches. Per phone notes, her neurologist had recommended that she try tylenol with benadryl- she has been nervous to try this but will give it a try   She notes that her BP today was 131/52.  It was higher when she was on topamax but has come down since she stopped it  Patient Active Problem List   Diagnosis Date Noted  . History of chest pain 07/20/2018  . History of palpitations  07/20/2018  . Brain aneurysm 07/20/2018  . Bronchitis 04/23/2018  . Tendonitis, Achilles, right 12/08/2017  . Onychomycosis 12/08/2017  . Unilateral primary osteoarthritis, left knee 10/07/2017  . Ingrown toenail 08/10/2017  . Coughing 04/24/2017  . CAD (coronary artery disease) 03/01/2015  . Aneurysm ([place]) 03/01/2015  . Dizziness 03/01/2015  . Hypercholesterolemia 03/01/2015  . Decreased potassium in the blood 03/01/2015  . Paresthesia of arm 03/01/2015  . Allergic rhinitis 03/01/2015  . Achalasia 09/28/2012  . CN (constipation) 09/28/2012  . Cephalalgia 08/24/2012  . HTN (hypertension) 04/19/2011  . Hyperlipidemia 04/19/2011  . GERD (gastroesophageal reflux disease) 04/19/2011  . Chest pain 04/04/2011    Past Medical History:  Diagnosis Date  . 3-vessel CAD 03/01/2015  . Achalasia 09/28/2012  . Allergic rhinitis 03/01/2015  . Aneurysm ([place]) 03/01/2015  . BP (high blood pressure) 03/01/2015  . Bronchitis 04/23/2018  . Cephalalgia 08/24/2012   Overview:  ICD-10 cut over    . Chest pain 04/04/2011  . CN (constipation) 09/28/2012  . Coughing 04/24/2017  . Decreased potassium in the blood 03/01/2015  . Diverticulosis   . Dizziness 03/01/2015  . GERD (gastroesophageal reflux disease)   . Hiatal hernia   . Hypercholesterolemia 03/01/2015  . Hyperlipidemia   . Hypertension   . Ingrown toenail 08/10/2017  . Inguinal hernia   .  Migraine headache   . Onychomycosis 12/08/2017  . Paresthesia of arm 03/01/2015  . Schatzki's ring   . Tendonitis, Achilles, right 12/08/2017  . Unilateral primary osteoarthritis, left knee 10/07/2017    Past Surgical History:  Procedure Laterality Date  . ABDOMINAL HYSTERECTOMY    . BACK SURGERY     x 3  . CARPAL TUNNEL RELEASE     left wrist  . CERVICAL SPINE SURGERY    . HEMORRHOID SURGERY      Social History   Tobacco Use  . Smoking status: Former Smoker    Packs/day: 0.50    Years: 20.00    Pack years: 10.00    Types: Cigarettes    Quit  date: 02/19/1984    Years since quitting: 34.7  . Smokeless tobacco: Never Used  Substance Use Topics  . Alcohol use: No  . Drug use: No    Family History  Problem Relation Age of Onset  . Allergic rhinitis Sister   . Diabetes Sister   . Hypertension Sister   . Heart attack Father 41  . Heart disease Father   . Stomach cancer Maternal Grandmother   . Heart disease Mother   . Colon cancer Neg Hx   . Angioedema Neg Hx   . Asthma Neg Hx   . Eczema Neg Hx   . Urticaria Neg Hx   . Immunodeficiency Neg Hx   . Breast cancer Neg Hx   . Migraines Neg Hx   . Headache Neg Hx     Allergies  Allergen Reactions  . Shellfish Allergy Hives and Swelling  . Ace Inhibitors Other (See Comments)    Pt cannot recall her reaction but tolerates arb   . Pitavastatin   . Topiramate     Heart race,   . Sulfa Antibiotics Other (See Comments) and Rash    Fine bumps    Medication list has been reviewed and updated.  Current Outpatient Medications on File Prior to Visit  Medication Sig Dispense Refill  . acetaminophen-codeine (TYLENOL #3) 300-30 MG tablet Take 1-2 tablets by mouth every 8 (eight) hours as needed for moderate pain. 30 tablet 0  . alendronate (FOSAMAX) 10 MG tablet TAKE 1 TABLET BY MOUTH DAILY BEFORE BREAKFAST. TAKE WITH A FULL GLASS OF WATER ON EMPTY STOMACH 90 tablet 1  . aspirin 81 MG tablet Take 81 mg by mouth daily.    . Calcium Carbonate (CALCIUM 600 PO) Take 1 tablet by mouth daily.    . fluticasone (FLONASE) 50 MCG/ACT nasal spray USE 2 SPRAYS IN NOSTRIL(S) DAILY AS NEEDED. 16 g 9  . losartan (COZAAR) 50 MG tablet Take 1-2 tablets (50-100 mg total) by mouth daily. For blood pressure 180 tablet 3  . Multiple Vitamins-Minerals (MULTIVITAL) tablet Take by mouth.    . NONFORMULARY OR COMPOUNDED ITEM Allergen immunotherapy 2 each 1  . pantoprazole (PROTONIX) 40 MG tablet Take 1 tablet (40 mg total) by mouth daily. Take 30-60 minutes before breakfast. 90 tablet 1  . senna  (SENOKOT) 8.6 MG tablet Take 1 tablet by mouth 2 (two) times a day.    . sertraline (ZOLOFT) 25 MG tablet Take 1 tablet (25 mg total) by mouth daily. 30 tablet 6  . SUMAtriptan (IMITREX) 25 MG tablet Take 1 tablet (25 mg total) by mouth once for 1 dose. Max May repeat in 2 hours if headache persists or recurs. Max 100 mg per day 10 tablet 0  . topiramate (TOPAMAX) 25 MG tablet Take 1  tablet (25 mg total) by mouth 2 (two) times daily. 60 tablet 3   No current facility-administered medications on file prior to visit.     Review of Systems:  No fever or chill  Physical Examination: There were no vitals filed for this visit. There were no vitals filed for this visit. There is no height or weight on file to calculate BMI. Ideal Body Weight:    No video available Pt sounds well No cough or SOB or distress is apparent   Assessment and Plan: GAD (generalized anxiety disorder)  Migraine without aura and without status migrainosus, not intractable  Essential hypertension  Spoke with pt for 7 minutes on the phone  Recommended a potassium recheck in about one month -right now we are working on increasing dietary potassium Per patient her blood pressures under good control, continue losartan No complaints about treatment for anxiety Persistent headaches.  Recommended that she try Tylenol and Benadryl as suggested by her neurologist.  She plans to do so Signed Lamar Blinks, MD

## 2018-11-23 NOTE — Telephone Encounter (Signed)
Please let her know that I am so sorry she is feeling poorly. She was having trouble with her blood pressures when last seen. Has she checked her blood pressure at home? If this is elevated, I need her to contact PCP asap. As for the migraines, she has a history of a brain aneurysm and is not a good candidate for triptan or NSAID therapy. Has she taken Tylenol? Two extra strength Tylenol with a benadryl can sometimes help abort a headache. If pain is severe or if any concerning symptoms (vision changes, weakness, speech changes, gait changes, numbness..etc)..she needs immediate medical evaluation.

## 2018-11-23 NOTE — Patient Outreach (Signed)
Telephone outreach. I have called Mrs. Cargle several times without success. I will send her an unsuccessful letter and hope that she will then contact me. I will call her again in 2 weeks should I not hear from her.  [name]. [name], MSN, [facility] Gerontological Nurse Practitioner [facility] Care Management [phone]

## 2018-11-24 ENCOUNTER — Encounter: Payer: Self-pay | Admitting: Neurology

## 2018-11-24 ENCOUNTER — Other Ambulatory Visit: Payer: Self-pay | Admitting: Family Medicine

## 2018-11-24 ENCOUNTER — Ambulatory Visit (INDEPENDENT_AMBULATORY_CARE_PROVIDER_SITE_OTHER): Payer: Medicare Other | Admitting: Neurology

## 2018-11-24 VITALS — BP 152/70 | HR 71 | Ht 65.0 in | Wt 211.0 lb

## 2018-11-24 DIAGNOSIS — Z820 Family history of epilepsy and other diseases of the nervous system: Secondary | ICD-10-CM

## 2018-11-24 DIAGNOSIS — R0683 Snoring: Secondary | ICD-10-CM

## 2018-11-24 DIAGNOSIS — R05 Cough: Secondary | ICD-10-CM | POA: Diagnosis not present

## 2018-11-24 DIAGNOSIS — R519 Headache, unspecified: Secondary | ICD-10-CM

## 2018-11-24 DIAGNOSIS — E669 Obesity, unspecified: Secondary | ICD-10-CM | POA: Diagnosis not present

## 2018-11-24 DIAGNOSIS — R058 Other specified cough: Secondary | ICD-10-CM

## 2018-11-24 DIAGNOSIS — R351 Nocturia: Secondary | ICD-10-CM

## 2018-11-24 MED ORDER — PROPRANOLOL HCL 20 MG PO TABS: 20.0000 mg | ORAL_TABLET | Freq: Two times a day (BID) | ORAL | 6 refills | Status: DC

## 2018-11-24 NOTE — Telephone Encounter (Signed)
I called pt and relayed that per AL/NP one of the medications that she spoke to you about was propranolol 60mg  po bid.  (initially you will take 60mg  po daily for one week then increase to BID)/ Call us back for questions, concerns, worsening sx.  She verbalized understanding.  (prescription will have 60mg  po BID).

## 2018-11-24 NOTE — Telephone Encounter (Signed)
Pt here seeing Dr.Athar.  I saw pt after her visit. I relayed again as note that AL/NP was willing to try another preventative or Ubrelvy (acute therapy).   She is willing to try another preventative as previously discussed.  I will forward to AL/NP and then let her know. Pt verbalized understanding.  Was appreciative.

## 2018-11-24 NOTE — Telephone Encounter (Signed)
Let her know that I have called in propranolol 20mg  twice daily. This is one of the medications we discussed in the office. She will take one daily for 1 week then increase to 1 tablet twice daily. Have her call with any concerns or worsening symptoms.

## 2018-11-24 NOTE — Progress Notes (Signed)
Subjective:    Patient ID: Stephanie Parks is a 73 y.o. female.  HPI    Star Age, MD, PhD [facility] [address], [address] [place], [place] [address]  Dear Stephanie Parks, I saw your patient, Stephanie Parks, upon your kind request to my sleep clinic today for initial consultation of her sleep disorder, in particular, concern for underlying obstructive sleep apnea.  The patient is unaccompanied today.  As you know, [name] is a 73 year old right-handed woman with an underlying medical history of hypertension, hyperlipidemia, reflux disease, diverticulosis, coronary artery disease, allergic rhinitis, migraine headaches, osteoarthritis, history of brain aneurysm and obesity, who reports snoring and excessive daytime somnolence, as well as a family history of sleep apnea and waking up with headaches at times.  I reviewed your office note from 11/16/2018.  She does not sleep very well.  She has a variable sleep schedule because she works second shift and sometimes first shift.  She works as a Scientist, water quality.  She is divorced and lives alone.  She quit smoking in 1986 and does not currently drink any caffeine on a daily basis and no alcohol.  She snores and used to grind her teeth.  She has a TV in the bedroom but turns it off at night.  She has no pets.  She has to go to the bathroom on average twice per night.  She has occasionally woken up with a headache.  She estimates that she sleeps on average 4 to 5 hours on any given night.  Her sister has sleep apnea.  She would be willing to consider CPAP therapy if she has sleep apnea. Lately, she has been sleeping in a recliner rather than a bed because of nighttime cough.  She has a history of reflux disease as well and takes pantoprazole.  Her Past Medical History Is Significant For: Past Medical History:  Diagnosis Date  . 3-vessel CAD 03/01/2015  . Achalasia 09/28/2012  . Allergic rhinitis 03/01/2015  . Aneurysm ([place]) 03/01/2015  .  BP (high blood pressure) 03/01/2015  . Bronchitis 04/23/2018  . Cephalalgia 08/24/2012   Overview:  ICD-10 cut over    . Chest pain 04/04/2011  . CN (constipation) 09/28/2012  . Coughing 04/24/2017  . Decreased potassium in the blood 03/01/2015  . Diverticulosis   . Dizziness 03/01/2015  . GERD (gastroesophageal reflux disease)   . Hiatal hernia   . Hypercholesterolemia 03/01/2015  . Hyperlipidemia   . Hypertension   . Ingrown toenail 08/10/2017  . Inguinal hernia   . Migraine headache   . Onychomycosis 12/08/2017  . Paresthesia of arm 03/01/2015  . Schatzki's ring   . Tendonitis, Achilles, right 12/08/2017  . Unilateral primary osteoarthritis, left knee 10/07/2017    Her Past Surgical History Is Significant For: Past Surgical History:  Procedure Laterality Date  . ABDOMINAL HYSTERECTOMY    . BACK SURGERY     x 3  . CARPAL TUNNEL RELEASE     left wrist  . CERVICAL SPINE SURGERY    . HEMORRHOID SURGERY      Her Family History Is Significant For: Family History  Problem Relation Age of Onset  . Allergic rhinitis Sister   . Diabetes Sister   . Hypertension Sister   . Heart attack Father 89  . Heart disease Father   . Stomach cancer Maternal Grandmother   . Heart disease Mother   . Colon cancer Neg Hx   . Angioedema Neg Hx   .  Asthma Neg Hx   . Eczema Neg Hx   . Urticaria Neg Hx   . Immunodeficiency Neg Hx   . Breast cancer Neg Hx   . Migraines Neg Hx   . Headache Neg Hx     Her Social History Is Significant For: Social History   Socioeconomic History  . Marital status: Divorced    Spouse name: Not on file  . Number of children: 2  . Years of education: Not on file  . Highest education level: Some college, no degree  Occupational History  . Occupation: [employer]  . Financial resource strain: Not on file  . Food insecurity    Worry: Not on file    Inability: Not on file  . Transportation needs    Medical: Not on file    Non-medical: Not on file   Tobacco Use  . Smoking status: Former Smoker    Packs/day: 0.50    Years: 20.00    Pack years: 10.00    Types: Cigarettes    Quit date: 02/19/1984    Years since quitting: 34.7  . Smokeless tobacco: Never Used  Substance and Sexual Activity  . Alcohol use: No  . Drug use: No  . Sexual activity: Yes    Partners: Male  Lifestyle  . Physical activity    Days per week: Not on file    Minutes per session: Not on file  . Stress: Not on file  Relationships  . Social Herbalist on phone: Not on file    Gets together: Not on file    Attends religious service: Not on file    Active member of club or organization: Not on file    Attends meetings of clubs or organizations: Not on file    Relationship status: Not on file  Other Topics Concern  . Not on file  Social History Narrative   Lives alone   Caffeine use: Coffee one cup daily   Right handed     Her Allergies Are:  Allergies  Allergen Reactions  . Shellfish Allergy Hives and Swelling  . Ace Inhibitors Other (See Comments)    Pt cannot recall her reaction but tolerates arb   . Pitavastatin   . Topiramate     Heart race,   . Sulfa Antibiotics Other (See Comments) and Rash    Fine bumps  :   Her Current Medications Are:  Outpatient Encounter Medications as of 11/24/2018  Medication Sig  . acetaminophen-codeine (TYLENOL #3) 300-30 MG tablet Take 1-2 tablets by mouth every 8 (eight) hours as needed for moderate pain.  Marland Kitchen alendronate (FOSAMAX) 10 MG tablet TAKE 1 TABLET BY MOUTH DAILY BEFORE BREAKFAST. TAKE WITH A FULL GLASS OF WATER ON EMPTY STOMACH  . aspirin 81 MG tablet Take 81 mg by mouth daily.  . Calcium Carbonate (CALCIUM 600 PO) Take 1 tablet by mouth daily.  . fluticasone (FLONASE) 50 MCG/ACT nasal spray USE 2 SPRAYS IN NOSTRIL(S) DAILY AS NEEDED.  Marland Kitchen losartan (COZAAR) 50 MG tablet Take 1-2 tablets (50-100 mg total) by mouth daily. For blood pressure  . Multiple Vitamins-Minerals (MULTIVITAL) tablet Take  by mouth.  . NONFORMULARY OR COMPOUNDED ITEM Allergen immunotherapy  . pantoprazole (PROTONIX) 40 MG tablet Take 1 tablet (40 mg total) by mouth daily. Take 30-60 minutes before breakfast.  . senna (SENOKOT) 8.6 MG tablet Take 1 tablet by mouth 2 (two) times a day.  . sertraline (ZOLOFT) 25 MG tablet Take  1 tablet (25 mg total) by mouth daily.  Marland Kitchen topiramate (TOPAMAX) 25 MG tablet Take 1 tablet (25 mg total) by mouth 2 (two) times daily.  . SUMAtriptan (IMITREX) 25 MG tablet Take 1 tablet (25 mg total) by mouth once for 1 dose. Max May repeat in 2 hours if headache persists or recurs. Max 100 mg per day   No facility-administered encounter medications on file as of 11/24/2018.   :  Review of Systems:  Out of a complete 14 point review of systems, all are reviewed and negative with the exception of these symptoms as listed below: Review of Systems  Neurological:       Pt presents today to discuss her sleep. Pt has never had a sleep study but does endorse snoring.  Epworth Sleepiness Scale 0= would never doze 1= slight chance of dozing 2= moderate chance of dozing 3= high chance of dozing  Sitting and reading: 2 Watching TV: 2 Sitting inactive in a public place (ex. Theater or meeting): 0 As a passenger in a car for an hour without a break: 0 Lying down to rest in the afternoon: 1 Sitting and talking to someone: 0 Sitting quietly after lunch (no alcohol): 2 In a car, while stopped in traffic: 0 Total: 7     Objective:  Neurological Exam  Physical Exam Physical Examination:   Vitals:   11/24/18 1106  BP: (!) 152/70  Pulse: 71    General Examination: The patient is a very pleasant 73 y.o. female in no acute distress. She appears well-developed and well-nourished and well groomed.   HEENT: Normocephalic, atraumatic, pupils are equal, round and reactive to light. Extraocular tracking is good without limitation to gaze excursion or nystagmus noted. Normal smooth pursuit is  noted. Hearing is grossly intact. Face is symmetric with normal facial animation and normal facial sensation. Speech is clear with no dysarthria noted. There is no hypophonia. There is no lip, neck/head, jaw or voice tremor. Neck is supple with full range of passive and active motion. There are no carotid bruits on auscultation. Oropharynx exam reveals: moderate mouth dryness, adequate dental hygiene, Edentulous on top and no dentures in place, she has dentures but did not put them in.  Airway crowding is in the mild to moderate range, tonsils are small, uvula a little wider, Mallampati class II.  Neck circumference is 15-1/2 inches. Tongue protrudes centrally and palate elevates symmetrically.   Chest: Clear to auscultation without wheezing, rhonchi or crackles noted.  Heart: S1+S2+0, regular and normal without murmurs, rubs or gallops noted.   Abdomen: Soft, non-tender and non-distended with normal bowel sounds appreciated on auscultation.  Extremities: There is no pitting edema in the distal lower extremities bilaterally. Nonpitting puffiness of the left ankle.   Skin: Warm and dry without trophic changes noted.  Musculoskeletal: exam reveals no obvious joint deformities, tenderness or joint swelling or erythema.   Neurologically:  Mental status: The patient is awake, alert and oriented in all 4 spheres. Her immediate and remote memory, attention, language skills and fund of knowledge are appropriate. There is no evidence of aphasia, agnosia, apraxia or anomia. Speech is clear with normal prosody and enunciation. Thought process is linear. Mood is normal and affect is normal.  Cranial nerves II - XII are as described above under HEENT exam. In addition: shoulder shrug is normal with equal shoulder height noted. Motor exam: Normal bulk, strength and tone is noted. There is no drift, tremor or rebound. Romberg is negative. Fine  motor skills and coordination: grossly intact.  Cerebellar testing: No  dysmetria or intention tremor. There is no truncal or gait ataxia.  Sensory exam: intact to light touch in the upper and lower extremities.  Gait, station and balance: She stands easily. No veering to one side is noted. No leaning to one side is noted. Posture is age-appropriate and stance is narrow based. Gait shows normal stride length and normal pace. No problems turning are noted.   Assessment and Plan:  In summary, LINDSEA LELAND is a very pleasant 73 y.o.-year old female with an underlying medical history of hypertension, hyperlipidemia, reflux disease, diverticulosis, coronary artery disease, allergic rhinitis, migraine headaches, osteoarthritis, history of brain aneurysm and obesity, who Presents for evaluation of her sleep disturbance.  Her history and examination are concerning for underlying obstructive sleep apnea (OSA). I had a long chat with the patient about my findings and the diagnosis of OSA, its prognosis and treatment options. We talked about medical treatments, surgical interventions and non-pharmacological approaches. I explained in particular the risks and ramifications of untreated moderate to severe OSA, especially with respect to developing cardiovascular disease down the Road, including congestive heart failure, difficult to treat hypertension, cardiac arrhythmias, or stroke. Even type 2 diabetes has, in part, been linked to untreated OSA. Symptoms of untreated OSA include daytime sleepiness, memory problems, mood irritability and mood disorder such as depression and anxiety, lack of energy, as well as recurrent headaches, especially morning headaches. We talked about trying to maintain a healthy lifestyle in general, as well as the importance of weight control. We also talked about the importance of good sleep hygiene. I recommended the following at this time: sleep study.   I explained the sleep test procedure to the patient and also outlined possible surgical and non-surgical  treatment options of OSA, including the use of a custom-made dental device (which would require a referral to a specialist dentist or oral surgeon), upper airway surgical options, such as traditional UPPP or a novel less invasive surgical option in the form of Inspire hypoglossal nerve stimulation (which would involve a referral to an ENT surgeon). I also explained the CPAP treatment option to the patient, who indicated that she would be willing to try CPAP if the need arises. I explained the importance of being compliant with PAP treatment, not only for insurance purposes but primarily to improve Her symptoms, and for the patient's long term health benefit, including to reduce Her cardiovascular risks. I answered all her questions today and the patient was in agreement. I plan to see her back after the sleep study is completed and encouraged her to call with any interim questions, concerns, problems or updates.   Thank you very much for allowing me to participate in the care of this nice patient. If I can be of any further assistance to you please do not hesitate to talk to me.   Sincerely,   Star Age, MD, PhD

## 2018-11-24 NOTE — Progress Notes (Signed)
prop

## 2018-11-24 NOTE — Patient Instructions (Signed)

## 2018-11-24 NOTE — Telephone Encounter (Signed)
Pt here for visit wanting to discuss what telephone call was regarding if possible. Please advise.

## 2018-11-25 ENCOUNTER — Encounter: Payer: Self-pay | Admitting: *Deleted

## 2018-11-25 ENCOUNTER — Other Ambulatory Visit: Payer: Self-pay | Admitting: *Deleted

## 2018-11-25 ENCOUNTER — Ambulatory Visit: Payer: Medicare Other | Admitting: Internal Medicine

## 2018-11-25 NOTE — Patient Outreach (Signed)
Returned call from [name]. She verified from her provided that a referral was made and now she feels more comfortable talking with me.  We just spent our time today discussing her most distressful problem at this time which is her daily migraine HAs.  She reports they happen at all times of day, The location of them can be anywhere on her head. She reports she can function of her pain level is</= 5/10. Above 5 it is difficult to function. She has been seeing her primary care MD and a neurologist. She has tried several meds specific for migrains but has had worrisome side effects, for example her blood pressure goes up to levels which are frightening to her.  We discussed: 1) trying a regular schedule of APAP 1000 mg tid for a trial;     2) Meditation and listening to music                           3) Head, neck and shoulders exercises     4) possibility of getting regular massage  THN CM Care Plan Problem One     Most Recent Value  Care Plan Problem One  Migraine Headaches  Role Documenting the Problem One  Care Management [place] for Problem One  Active  THN Long Term Goal   Pt will discover new methods to relieve or cope with her headaches over the next 90 days.  THN Long Term Goal Start Date  11/25/18  Interventions for Problem One Long Term Goal  Discussed routine mild pain med regimen, meditation, relaxation, listening to music, focused exercises, massage.  THN CM Short Term Goal #1   Pt to find out from her carrier if she can have alternative medical tx: massage over the next 30 days..  THN CM Short Term Goal #1 Start Date  11/25/18  Interventions for Short Term Goal #1  Discussed alternative medicine especially massage which may be beneficial to her.    THN CM Care Plan Problem Two     Most Recent Value  Care Plan Problem Two  HTN  Role Documenting the Problem Two  Care Management [place] for Problem Two  Active  Interventions for Problem Two Long  Term Goal   Coaching pt on when to go to the ED. Discussed importance of lowering her stress and pain level.  THN Long Term Goal  Will not have to go to the ED with elevated blood pressure over the next 90 days.  THN Long Term Goal Start Date  11/25/18     She agrees to participate with me on weekly calls over the next month. I will attempt to complete her full assessment next week.  [name]. [name], MSN, [facility] Gerontological Nurse Practitioner [facility] Care Management [phone]

## 2018-11-25 NOTE — Patient Outreach (Signed)
Correction: I will talk to pt every other week until improved HA, stablized BP is established. Next call 12/08/18.  Stephanie Parks. [name], MSN, [facility] Gerontological Nurse Practitioner [facility] Care Management [phone]

## 2018-11-26 ENCOUNTER — Telehealth: Payer: Self-pay | Admitting: Family Medicine

## 2018-11-26 NOTE — Telephone Encounter (Signed)
Pt called and stated that she needs an RX for a shield mask so that she can use at work because she has a hard time breathing in a regular mask. Pt would like a call back from the nurse regarding. Please advise

## 2018-11-26 NOTE — Telephone Encounter (Signed)
Called her back- she needs a note stating that she can wear a face shield at work instead of a regular mask. She has a hard time breathing in a regular mask .she wishes to pick up her note from the office, will print for her  She also notes that her systolic blood pressures typically in the 130s- 140s range.  I advised her that this is fine

## 2018-12-01 DIAGNOSIS — I10 Essential (primary) hypertension: Secondary | ICD-10-CM | POA: Diagnosis not present

## 2018-12-01 DIAGNOSIS — M4726 Other spondylosis with radiculopathy, lumbar region: Secondary | ICD-10-CM | POA: Diagnosis not present

## 2018-12-01 DIAGNOSIS — M4802 Spinal stenosis, cervical region: Secondary | ICD-10-CM | POA: Diagnosis not present

## 2018-12-04 ENCOUNTER — Other Ambulatory Visit: Payer: Self-pay | Admitting: Medical

## 2018-12-04 ENCOUNTER — Other Ambulatory Visit: Payer: Self-pay | Admitting: Family Medicine

## 2018-12-08 ENCOUNTER — Other Ambulatory Visit: Payer: Self-pay | Admitting: *Deleted

## 2018-12-08 ENCOUNTER — Encounter: Payer: Self-pay | Admitting: *Deleted

## 2018-12-08 NOTE — Patient Outreach (Signed)
Bimonthly call for follow up HAs and HTN.  Unsuccessful calls to home and cell but was able to leave a message on both for return call.  Mrs. Madson returned my call. She reports being very frustrated that the trials of treatment for her HAs and HTN haven't gotten her to the place she wants to be.   HTN: Her blood pressure is up and down and she is still experiencing light headedness. She is unsure if this happens with changes in position. She states she feels like it was controlled better with HCTZ.  HA: She has been taking some muscle relaxer she found in her medication cabinet: methocarbamol 500 mg every 8 hours which seems to help most of all.  She has not called her insurance provider to see if they would cover her for massage which I think would be very helpful.  She states she does not intend to follow up with neurology they haven't been able to help her and now they want her to see an NP and she would rather see an MD.  Weight: Current 211 and she would like to lose 30#. Discussed wt loss and counting carbs. I will send her some very helpful information: All About Carbs.  Depression: Worse now that she continues to have problems with HAs and HTN. PQH9 score of 9. On sertraline 25 mg.  Advanced Directives: Pt has these documents and I have discussed the importance at length today.  DR. Edilia Bo:  PT NEEDS VIRTUAL VISIT TO DISCUSS HER BLOOD PRESSURE AND CONT. HA's.SHE WOULD LIKE TO GO BACK ON HCTZ IF YOU THINK THIS IS APPROPRIATE. THE METHOCARBAMOL GIVES HER BEST RELIEF. SHE WOULD BENEFIT FROM MASSAGE IF YOU WOULD WRITE HER AN RX. SHE NEEDS AN INCREASE IN HER SERTRALINE TO 50 MG. IT WOULD ALSO BE HELPFUL TO GET ORTHOSTATIC BP READINGS.  THANK YOU!  [name]. [name], MSN, [facility] Gerontological Nurse Practitioner [facility] Care Management [phone]

## 2018-12-09 ENCOUNTER — Other Ambulatory Visit: Payer: Self-pay | Admitting: Family Medicine

## 2018-12-09 ENCOUNTER — Other Ambulatory Visit: Payer: Self-pay | Admitting: Neurosurgery

## 2018-12-09 DIAGNOSIS — M4802 Spinal stenosis, cervical region: Secondary | ICD-10-CM

## 2018-12-09 DIAGNOSIS — R102 Pelvic and perineal pain: Secondary | ICD-10-CM

## 2018-12-09 NOTE — Progress Notes (Signed)
Stephanie Parks- can you call her please and set up an appt.  It might be better to see her in person so we can check her K and labs.  Thank you!

## 2018-12-09 NOTE — Progress Notes (Signed)
VIAL EXP 12-09-19

## 2018-12-10 ENCOUNTER — Other Ambulatory Visit: Payer: Self-pay

## 2018-12-10 ENCOUNTER — Encounter: Payer: Self-pay | Admitting: Family Medicine

## 2018-12-10 ENCOUNTER — Ambulatory Visit (INDEPENDENT_AMBULATORY_CARE_PROVIDER_SITE_OTHER): Payer: Medicare Other | Admitting: Family Medicine

## 2018-12-10 VITALS — BP 146/72 | HR 83 | Temp 96.5°F | Resp 18 | Ht 65.0 in | Wt 210.0 lb

## 2018-12-10 DIAGNOSIS — Z5181 Encounter for therapeutic drug level monitoring: Secondary | ICD-10-CM | POA: Diagnosis not present

## 2018-12-10 DIAGNOSIS — E785 Hyperlipidemia, unspecified: Secondary | ICD-10-CM

## 2018-12-10 DIAGNOSIS — E2839 Other primary ovarian failure: Secondary | ICD-10-CM | POA: Diagnosis not present

## 2018-12-10 DIAGNOSIS — J3089 Other allergic rhinitis: Secondary | ICD-10-CM | POA: Diagnosis not present

## 2018-12-10 DIAGNOSIS — I10 Essential (primary) hypertension: Secondary | ICD-10-CM | POA: Diagnosis not present

## 2018-12-10 DIAGNOSIS — E876 Hypokalemia: Secondary | ICD-10-CM | POA: Diagnosis not present

## 2018-12-10 LAB — BASIC METABOLIC PANEL
BUN: 11 mg/dL (ref 6–23)
CO2: 34 mEq/L — ABNORMAL HIGH (ref 19–32)
Calcium: 8.9 mg/dL (ref 8.4–10.5)
Chloride: 103 mEq/L (ref 96–112)
Creatinine, Ser: 0.88 mg/dL (ref 0.40–1.20)
GFR: 76.18 mL/min (ref >60.00)
Glucose, Bld: 93 mg/dL (ref 70–99)
Potassium: 3.5 mEq/L (ref 3.5–5.1)
Sodium: 143 mEq/L (ref 135–145)

## 2018-12-10 MED ORDER — LOSARTAN POTASSIUM-HCTZ 50-12.5 MG PO TABS: 1.0000 | ORAL_TABLET | Freq: Every day | ORAL | 3 refills | Status: DC

## 2018-12-10 MED ORDER — POTASSIUM CHLORIDE CRYS ER 20 MEQ PO TBCR: 20.0000 meq | EXTENDED_RELEASE_TABLET | Freq: Every day | ORAL | 1 refills | Status: DC

## 2018-12-10 MED ORDER — ROSUVASTATIN CALCIUM 10 MG PO TABS: 10.0000 mg | ORAL_TABLET | Freq: Every day | ORAL | 3 refills | Status: DC

## 2018-12-10 NOTE — Patient Instructions (Addendum)
It was good to see you again today I will be in touch with your labs Go back on Crestor for your cholesterol- let me know if any problems For your BP, let's stop losartan 100 mg Start on losartan 50/12.5 once a day AND potassium (klor-con) 20 mg once a day  Please let me know how your BP looks with this change  I am not sure if you still need to take alendronate  Let's get a bone density scan for you- I have ordered this test

## 2018-12-10 NOTE — Progress Notes (Addendum)
[place] [facility] [address], [place], [place] [address] [phone]  Date:  12/10/2018   Name:  Stephanie Parks   DOB:  [birth year]   MRN:  [id number]  PCP:  [name], MD    Chief Complaint: Hypertension (lightheadeness)   History of Present Illness:  Stephanie Parks is a 73 y.o. very pleasant female patient who presents with the following:  Patient with history of hypertension, CAD, brain aneurysm, GERD, hyperlipidemia  We have been struggling recently with headaches and uncontrolled hypertension as well as significant anxiety She has gone to the ER several times with concern about her blood pressure  Most recently seen by myself for virtual visit on October 5-at that time her blood pressure seemed to be under good control with losartan  She has seen neurology both for headaches and also for a sleep evaluation-it looks like they plan to do a sleep study but I do not think this has been completed yet  One of our nurse practitioners called her yesterday and took the following notes:  Bimonthly call for follow up HAs and HTN. Stephanie Parks returned my call. She reports being very frustrated that the trials of treatment for her HAs and HTN haven't gotten her to the place she wants to be.  HTN: Her blood pressure is up and down and she is still experiencing light headedness. She is unsure if this happens with changes in position. She states she feels like it was controlled better with HCTZ. HA: She has been taking some muscle relaxer she found in her medication cabinet: methocarbamol 500 mg every 8 hours which seems to help most of all. She has not called her insurance provider to see if they would cover her for massage which I think would be very helpful. She states she does not intend to follow up with neurology they haven't been able to help her and now they want her to see an NP and she would rather see an  MD. Weight: Current 211 and she would like to lose 30#. Discussed wt loss and counting carbs. I will send her some very helpful information: All About Carbs. Depression: Worse now that she continues to have problems with HAs and HTN. PQH9 score of 9. On sertraline 25 mg.   She notes that she continues to feel lightheaded "evey day"-she is frustrated that she is still lightheaded.   She is not so much having headaches as pressure in her neck, "and the neck pain" She is going to have a cervical spine MRI next month, sleep study at the end of this month   She is taking 100 mg of losartan right now   Her home BP "is up and down, it's never the same every day" She feels like she did better with losartan/hctz in the past and would like to go back on this   She has been checking her BP at home but forgot to bring her list with her  She feels like taking potassium in the past "took that lightheadedness away"  She has noted lightheadedness daily for months per her report -I had been under the impression that her symptoms were more headaches than lightheadedness  She tried to use compression socks but could not get them to fit   She had stopped Crestor a few months ago, thinking that it might be the cause of her symptoms.  However as this has not improved,  she is 1 year back on Crestor She would really like to go back on losartan/HCTZ like she was taking in the first place, along with potassium 20 mEq daily  She is to Fosamax 10 mg daily.  Looking back, she had a mildly abnormal DEXA scan in 2018-osteopenia This is when her Fosamax was started.  Advised her that we may build discontinue Fosamax, will plan to recheck a bone density  BP Readings from Last 3 Encounters:  12/10/18 (!) 146/72  11/24/18 (!) 152/70  11/16/18 126/80     Patient Active Problem List   Diagnosis Date Noted  . History of chest pain 07/20/2018  . Brain aneurysm 07/20/2018  . Bronchitis 04/23/2018  . Tendonitis,  Achilles, right 12/08/2017  . Onychomycosis 12/08/2017  . Unilateral primary osteoarthritis, left knee 10/07/2017  . Ingrown toenail 08/10/2017  . Coughing 04/24/2017  . CAD (coronary artery disease) 03/01/2015  . Aneurysm ([place]) 03/01/2015  . Dizziness 03/01/2015  . Paresthesia of arm 03/01/2015  . Allergic rhinitis 03/01/2015  . Achalasia 09/28/2012  . CN (constipation) 09/28/2012  . Cephalalgia 08/24/2012  . HTN (hypertension) 04/19/2011  . Hyperlipidemia 04/19/2011  . GERD (gastroesophageal reflux disease) 04/19/2011  . Chest pain 04/04/2011    Past Medical History:  Diagnosis Date  . 3-vessel CAD 03/01/2015  . Achalasia 09/28/2012  . Allergic rhinitis 03/01/2015  . Aneurysm ([place]) 03/01/2015  . BP (high blood pressure) 03/01/2015  . Bronchitis 04/23/2018  . Cephalalgia 08/24/2012   Overview:  ICD-10 cut over    . Chest pain 04/04/2011  . CN (constipation) 09/28/2012  . Coughing 04/24/2017  . Decreased potassium in the blood 03/01/2015  . Diverticulosis   . Dizziness 03/01/2015  . GERD (gastroesophageal reflux disease)   . Hiatal hernia   . Hypercholesterolemia 03/01/2015  . Hyperlipidemia   . Hypertension   . Ingrown toenail 08/10/2017  . Inguinal hernia   . Migraine headache   . Onychomycosis 12/08/2017  . Paresthesia of arm 03/01/2015  . Schatzki's ring   . Tendonitis, Achilles, right 12/08/2017  . Unilateral primary osteoarthritis, left knee 10/07/2017    Past Surgical History:  Procedure Laterality Date  . ABDOMINAL HYSTERECTOMY    . BACK SURGERY     x 3  . CARPAL TUNNEL RELEASE     left wrist  . CERVICAL SPINE SURGERY    . HEMORRHOID SURGERY      Social History   Tobacco Use  . Smoking status: Former Smoker    Packs/day: 0.50    Years: 20.00    Pack years: 10.00    Types: Cigarettes    Quit date: 02/19/1984    Years since quitting: 34.8  . Smokeless tobacco: Never Used  Substance Use Topics  . Alcohol use: No    Frequency: Never  . Drug use: No     Family History  Problem Relation Age of Onset  . Allergic rhinitis Sister   . Diabetes Sister   . Hypertension Sister   . Heart attack Father 63  . Heart disease Father   . Stomach cancer Maternal Grandmother   . Heart disease Mother   . Colon cancer Neg Hx   . Angioedema Neg Hx   . Asthma Neg Hx   . Eczema Neg Hx   . Urticaria Neg Hx   . Immunodeficiency Neg Hx   . Breast cancer Neg Hx   . Migraines Neg Hx   . Headache Neg Hx     Allergies  Allergen Reactions  .  Shellfish Allergy Hives and Swelling  . Ace Inhibitors Other (See Comments)    Pt cannot recall her reaction but tolerates arb   . Pitavastatin   . Topiramate     Heart race,   . Sulfa Antibiotics Other (See Comments) and Rash    Fine bumps    Medication list has been reviewed and updated.  Current Outpatient Medications on File Prior to Visit  Medication Sig Dispense Refill  . acetaminophen-codeine (TYLENOL #3) 300-30 MG tablet Take 1-2 tablets by mouth every 8 (eight) hours as needed for moderate pain. 30 tablet 0  . alendronate (FOSAMAX) 10 MG tablet TAKE 1 TABLET BY MOUTH DAILY BEFORE BREAKFAST. TAKE WITH A FULL GLASS OF WATER ON EMPTY STOMACH 90 tablet 1  . aspirin 81 MG tablet Take 81 mg by mouth daily.    . Calcium Carbonate (CALCIUM 600 PO) Take 1 tablet by mouth daily.    . fluticasone (FLONASE) 50 MCG/ACT nasal spray USE 2 SPRAYS IN NOSTRIL(S) DAILY AS NEEDED. 16 g 9  . losartan (COZAAR) 50 MG tablet Take 1-2 tablets (50-100 mg total) by mouth daily. For blood pressure 180 tablet 3  . Multiple Vitamins-Minerals (MULTIVITAL) tablet Take by mouth.    . NONFORMULARY OR COMPOUNDED ITEM Allergen immunotherapy 2 each 1  . pantoprazole (PROTONIX) 40 MG tablet Take 1 tablet (40 mg total) by mouth daily. Take 30-60 minutes before breakfast. 90 tablet 1  . propranolol (INDERAL) 20 MG tablet Take 1 tablet (20 mg total) by mouth 2 (two) times daily. 60 tablet 6  . senna (SENOKOT) 8.6 MG tablet Take 1 tablet  by mouth 2 (two) times a day.    . sertraline (ZOLOFT) 25 MG tablet Take 1 tablet (25 mg total) by mouth daily. 30 tablet 6  . SUMAtriptan (IMITREX) 25 MG tablet 1 TABLET EVERY 2HOURS AS NEEDED FOR MIGRAINE. MAY REPEAT IN 2HOURS IF HEADACHE PERSISTS OR RECURS. 10 tablet 1  . topiramate (TOPAMAX) 25 MG tablet Take 1 tablet (25 mg total) by mouth 2 (two) times daily. 60 tablet 3   No current facility-administered medications on file prior to visit.     Review of Systems:  As per HPI- otherwise negative.   Physical Examination: Vitals:   12/10/18 1313  BP: (!) 146/72  Pulse: 83  Resp: 18  Temp: (!) 96.5 F (35.8 C)  SpO2: 97%   Vitals:   12/10/18 1313  Weight: 210 lb (95.3 kg)  Height: 5\' 5"  (1.651 m)   Body mass index is 34.95 kg/m. Ideal Body Weight: Weight in (lb) to have BMI = 25: 149.9  GEN: WDWN, NAD, Non-toxic, A & O x 3, obese, looks well HEENT: Atraumatic, Normocephalic. Neck supple. No masses, No LAD. Ears and Nose: No external deformity. CV: RRR, No M/G/R. No JVD. No thrill. No extra heart sounds. PULM: CTA B, no wheezes, crackles, rhonchi. No retractions. No resp. distress. No accessory muscle use. ABD: S, NT, ND, +BS. No rebound. No HSM. EXTR: No c/c/e NEURO Normal gait.  PSYCH: Normally interactive. Conversant. Not depressed or anxious appearing.  Calm demeanor.    Assessment and Plan: Hyperlipidemia, unspecified hyperlipidemia type - Plan: rosuvastatin (CRESTOR) 10 MG tablet  Essential hypertension - Plan: losartan-hydrochlorothiazide (HYZAAR) 50-12.5 MG tablet, Basic metabolic panel  Medication monitoring encounter - Plan: potassium chloride SA (KLOR-CON M20) 20 MEQ tablet  Estrogen deficiency - Plan: DG Bone Density  Following up today on a few different concerns Stephanie Parks has struggled with headaches, and labile blood  pressure over the last several months.  I do feel there is an element of anxiety which may be contributing to her blood pressure  problems.  We have made several blood pressure medication changes on her request, she would now like to go back to her original medication which is losartan/HCTZ. This is fine, we will put her back on losartan HCTZ along with 20 mEq of potassium daily.  Check BMP today She will monitor her blood pressure, as we are making a change in medication Restart Crestor-went over most common side effects, she will let her know if any problems Recheck bone density  Signed Lamar Blinks, MD  Received her labs, message to pt   Your sodium and potasium are normal Going back on HCTZ may drop your potassium; however we also need to make sure your potassium does not go too high with the supplement (with potassium too low is bad but too high is worse!)  I would recommend that you take the potassium ever OTHER day, and let's have you come in for labs only in 2-3 weeks so we can check your potassium  Does this sound ok to you?  Results for orders placed or performed in visit on [id number]  Basic metabolic panel  Result Value Ref Range   Sodium 143 135 - 145 mEq/L   Potassium 3.5 3.5 - 5.1 mEq/L   Chloride 103 96 - 112 mEq/L   CO2 34 (H) 19 - 32 mEq/L   Glucose, Bld 93 70 - 99 mg/dL   BUN 11 6 - 23 mg/dL   Creatinine, Ser 0.88 0.40 - 1.20 mg/dL   Calcium 8.9 8.4 - 10.5 mg/dL   GFR 76.18 >60.00 mL/min

## 2018-12-10 NOTE — Addendum Note (Signed)
Addended by: Lamar Blinks C on: 12/10/2018 06:14 PM   Modules accepted: Orders

## 2018-12-14 ENCOUNTER — Encounter: Payer: Self-pay | Admitting: *Deleted

## 2018-12-16 ENCOUNTER — Other Ambulatory Visit: Payer: Medicare Other

## 2018-12-17 ENCOUNTER — Ambulatory Visit (INDEPENDENT_AMBULATORY_CARE_PROVIDER_SITE_OTHER): Payer: Medicare Other | Admitting: Neurology

## 2018-12-17 ENCOUNTER — Other Ambulatory Visit: Payer: Self-pay

## 2018-12-17 DIAGNOSIS — E669 Obesity, unspecified: Secondary | ICD-10-CM

## 2018-12-17 DIAGNOSIS — R0683 Snoring: Secondary | ICD-10-CM

## 2018-12-17 DIAGNOSIS — Z820 Family history of epilepsy and other diseases of the nervous system: Secondary | ICD-10-CM

## 2018-12-17 DIAGNOSIS — G4733 Obstructive sleep apnea (adult) (pediatric): Secondary | ICD-10-CM | POA: Diagnosis not present

## 2018-12-17 DIAGNOSIS — G4720 Circadian rhythm sleep disorder, unspecified type: Secondary | ICD-10-CM

## 2018-12-17 DIAGNOSIS — R05 Cough: Secondary | ICD-10-CM

## 2018-12-17 DIAGNOSIS — R351 Nocturia: Secondary | ICD-10-CM

## 2018-12-17 DIAGNOSIS — R519 Headache, unspecified: Secondary | ICD-10-CM

## 2018-12-17 DIAGNOSIS — R058 Other specified cough: Secondary | ICD-10-CM

## 2018-12-19 ENCOUNTER — Other Ambulatory Visit: Payer: Self-pay

## 2018-12-19 ENCOUNTER — Ambulatory Visit
Admission: RE | Admit: 2018-12-19 | Discharge: 2018-12-19 | Disposition: A | Discharge status: Home / Self Care | Payer: Medicare Other | Source: Ambulatory Visit | Attending: Neurosurgery | Admitting: Neurosurgery

## 2018-12-19 DIAGNOSIS — M4802 Spinal stenosis, cervical region: Secondary | ICD-10-CM | POA: Diagnosis not present

## 2018-12-21 ENCOUNTER — Ambulatory Visit: Payer: Self-pay

## 2018-12-21 DIAGNOSIS — M542 Cervicalgia: Secondary | ICD-10-CM | POA: Diagnosis not present

## 2018-12-21 DIAGNOSIS — R002 Palpitations: Secondary | ICD-10-CM | POA: Diagnosis not present

## 2018-12-21 DIAGNOSIS — R42 Dizziness and giddiness: Secondary | ICD-10-CM | POA: Diagnosis not present

## 2018-12-21 DIAGNOSIS — R0602 Shortness of breath: Secondary | ICD-10-CM | POA: Diagnosis not present

## 2018-12-22 ENCOUNTER — Ambulatory Visit: Payer: Medicare Other | Admitting: Family Medicine

## 2018-12-22 ENCOUNTER — Emergency Department (HOSPITAL_BASED_OUTPATIENT_CLINIC_OR_DEPARTMENT_OTHER)
Admission: EM | Admit: 2018-12-22 | Discharge: 2018-12-22 | Disposition: A | Discharge status: Home / Self Care | Payer: Medicare Other | Attending: Emergency Medicine | Admitting: Emergency Medicine

## 2018-12-22 ENCOUNTER — Encounter (HOSPITAL_BASED_OUTPATIENT_CLINIC_OR_DEPARTMENT_OTHER): Payer: Self-pay | Admitting: *Deleted

## 2018-12-22 ENCOUNTER — Other Ambulatory Visit: Payer: Self-pay

## 2018-12-22 ENCOUNTER — Other Ambulatory Visit (HOSPITAL_BASED_OUTPATIENT_CLINIC_OR_DEPARTMENT_OTHER): Payer: Medicare Other

## 2018-12-22 ENCOUNTER — Encounter: Payer: Self-pay | Admitting: Family Medicine

## 2018-12-22 ENCOUNTER — Ambulatory Visit: Payer: Self-pay | Admitting: Family Medicine

## 2018-12-22 VITALS — BP 120/52 | HR 92 | Temp 98.1°F | Resp 18 | Ht 65.5 in | Wt 202.4 lb

## 2018-12-22 DIAGNOSIS — R002 Palpitations: Secondary | ICD-10-CM | POA: Insufficient documentation

## 2018-12-22 DIAGNOSIS — I10 Essential (primary) hypertension: Secondary | ICD-10-CM | POA: Diagnosis not present

## 2018-12-22 DIAGNOSIS — Z87891 Personal history of nicotine dependence: Secondary | ICD-10-CM | POA: Diagnosis not present

## 2018-12-22 DIAGNOSIS — Z79899 Other long term (current) drug therapy: Secondary | ICD-10-CM | POA: Diagnosis not present

## 2018-12-22 DIAGNOSIS — E785 Hyperlipidemia, unspecified: Secondary | ICD-10-CM

## 2018-12-22 DIAGNOSIS — J3089 Other allergic rhinitis: Secondary | ICD-10-CM

## 2018-12-22 DIAGNOSIS — Z87898 Personal history of other specified conditions: Secondary | ICD-10-CM | POA: Diagnosis not present

## 2018-12-22 DIAGNOSIS — I2510 Atherosclerotic heart disease of native coronary artery without angina pectoris: Secondary | ICD-10-CM

## 2018-12-22 DIAGNOSIS — J302 Other seasonal allergic rhinitis: Secondary | ICD-10-CM

## 2018-12-22 DIAGNOSIS — M542 Cervicalgia: Secondary | ICD-10-CM | POA: Diagnosis not present

## 2018-12-22 DIAGNOSIS — Z7982 Long term (current) use of aspirin: Secondary | ICD-10-CM | POA: Diagnosis not present

## 2018-12-22 DIAGNOSIS — R42 Dizziness and giddiness: Secondary | ICD-10-CM | POA: Diagnosis not present

## 2018-12-22 DIAGNOSIS — R0602 Shortness of breath: Secondary | ICD-10-CM | POA: Diagnosis not present

## 2018-12-22 LAB — CBC WITH DIFFERENTIAL/PLATELET
Abs Immature Granulocytes: 0.02 10*3/uL (ref 0.00–0.07)
Basophils Absolute: 0 10*3/uL (ref 0.0–0.1)
Basophils Relative: 0 %
Eosinophils Absolute: 0.2 10*3/uL (ref 0.0–0.5)
Eosinophils Relative: 3 %
HCT: 40.6 % (ref 36.0–46.0)
Hemoglobin: 12.7 g/dL (ref 12.0–15.0)
Immature Granulocytes: 0 %
Lymphocytes Relative: 33 %
Lymphs Abs: 2.7 10*3/uL (ref 0.7–4.0)
MCH: 28.5 pg (ref 26.0–34.0)
MCHC: 31.3 g/dL (ref 30.0–36.0)
MCV: 91 fL (ref 80.0–100.0)
Monocytes Absolute: 0.5 10*3/uL (ref 0.1–1.0)
Monocytes Relative: 6 %
Neutro Abs: 4.8 10*3/uL (ref 1.7–7.7)
Neutrophils Relative %: 58 %
Platelets: 358 10*3/uL (ref 150–400)
RBC: 4.46 MIL/uL (ref 3.87–5.11)
RDW: 14.9 % (ref 11.5–15.5)
WBC: 8.3 10*3/uL (ref 4.0–10.5)
nRBC: 0 % (ref 0.0–0.2)

## 2018-12-22 LAB — BASIC METABOLIC PANEL
Anion gap: 10 (ref 5–15)
BUN: 15 mg/dL (ref 8–23)
CO2: 28 mmol/L (ref 22–32)
Calcium: 9 mg/dL (ref 8.9–10.3)
Chloride: 103 mmol/L (ref 98–111)
Creatinine, Ser: 1.02 mg/dL — ABNORMAL HIGH (ref 0.44–1.00)
GFR calc Af Amer: >60 mL/min (ref >60)
GFR calc non Af Amer: 55 mL/min — ABNORMAL LOW (ref >60)
Glucose, Bld: 98 mg/dL (ref 70–99)
Potassium: 3 mmol/L — ABNORMAL LOW (ref 3.5–5.1)
Sodium: 141 mmol/L (ref 135–145)

## 2018-12-22 MED ORDER — AZELASTINE HCL 0.15 % NA SOLN: 1.0000 | Freq: Two times a day (BID) | NASAL | 5 refills | Status: DC

## 2018-12-22 MED ORDER — FLUTICASONE PROPIONATE 50 MCG/ACT NA SUSP: NASAL | 9 refills | Status: DC

## 2018-12-22 NOTE — ED Provider Notes (Signed)
[facility] EMERGENCY DEPARTMENT Provider Note   CSN: [id number] Arrival date & time: 12/22/18  1503     History   Chief Complaint Chief Complaint  Patient presents with  . Palpitations    HPI RUNELL KITCHINGS is a 73 y.o. female.     The history is provided by the patient.  Palpitations Palpitations quality:  Fast Onset quality:  Sudden Timing:  Intermittent Progression:  Waxing and waning Chronicity:  Recurrent Context: caffeine   Relieved by:  Nothing Worsened by:  Nothing Ineffective treatments:  None tried Associated symptoms: no back pain, no chest pain, no chest pressure, no cough, no diaphoresis, no dizziness, no hemoptysis, no leg pain, no lower extremity edema, no malaise/fatigue, no nausea, no near-syncope, no numbness, no orthopnea, no PND, no shortness of breath and no vomiting   Risk factors: heart disease     Past Medical History:  Diagnosis Date  . 3-vessel CAD 03/01/2015  . Achalasia 09/28/2012  . Allergic rhinitis 03/01/2015  . Aneurysm ([place]) 03/01/2015  . BP (high blood pressure) 03/01/2015  . Bronchitis 04/23/2018  . Cephalalgia 08/24/2012   Overview:  ICD-10 cut over    . Chest pain 04/04/2011  . CN (constipation) 09/28/2012  . Coughing 04/24/2017  . Decreased potassium in the blood 03/01/2015  . Diverticulosis   . Dizziness 03/01/2015  . GERD (gastroesophageal reflux disease)   . Hiatal hernia   . Hypercholesterolemia 03/01/2015  . Hyperlipidemia   . Hypertension   . Ingrown toenail 08/10/2017  . Inguinal hernia   . Migraine headache   . Onychomycosis 12/08/2017  . Paresthesia of arm 03/01/2015  . Schatzki's ring   . Tendonitis, Achilles, right 12/08/2017  . Unilateral primary osteoarthritis, left knee 10/07/2017    Patient Active Problem List   Diagnosis Date Noted  . Seasonal and perennial allergic rhinitis 12/22/2018  . Heart palpitations 12/22/2018  . History of chest pain 07/20/2018  . Brain aneurysm 07/20/2018  . Bronchitis  04/23/2018  . Tendonitis, Achilles, right 12/08/2017  . Onychomycosis 12/08/2017  . Unilateral primary osteoarthritis, left knee 10/07/2017  . Ingrown toenail 08/10/2017  . Coughing 04/24/2017  . CAD (coronary artery disease) 03/01/2015  . Aneurysm ([place]) 03/01/2015  . Dizziness 03/01/2015  . Paresthesia of arm 03/01/2015  . Allergic rhinitis 03/01/2015  . Achalasia 09/28/2012  . CN (constipation) 09/28/2012  . Cephalalgia 08/24/2012  . HTN (hypertension) 04/19/2011  . Hyperlipidemia 04/19/2011  . GERD (gastroesophageal reflux disease) 04/19/2011  . Chest pain 04/04/2011    Past Surgical History:  Procedure Laterality Date  . ABDOMINAL HYSTERECTOMY    . BACK SURGERY     x 3  . CARPAL TUNNEL RELEASE     left wrist  . CERVICAL SPINE SURGERY    . HEMORRHOID SURGERY       OB History   No obstetric history on file.      Home Medications    Prior to Admission medications   Medication Sig Start Date End Date Taking? Authorizing Provider  acetaminophen-codeine (TYLENOL #3) 300-30 MG tablet Take 1-2 tablets by mouth every 8 (eight) hours as needed for moderate pain. 10/27/18   Copland, Gay Filler, MD  aspirin 81 MG tablet Take 81 mg by mouth daily.    [provider]  Azelastine HCl 0.15 % SOLN Place 1-2 sprays into both nostrils 2 (two) times daily. 12/22/18   Dara Hoyer, FNP  Calcium Carbonate (CALCIUM 600 PO) Take 1 tablet by mouth daily.  [provider]  fluticasone (FLONASE) 50 MCG/ACT nasal spray USE 2 SPRAYS IN NOSTRIL(S) DAILY AS NEEDED. 12/22/18   Ambs, Kathrine Cords, FNP  losartan-hydrochlorothiazide (HYZAAR) 50-12.5 MG tablet Take 1 tablet by mouth daily. 12/10/18   Copland, Gay Filler, MD  Multiple Vitamins-Minerals (MULTIVITAL) tablet Take by mouth.    [provider]  NONFORMULARY OR COMPOUNDED ITEM Allergen immunotherapy 03/01/15   Leda Roys, MD  pantoprazole (PROTONIX) 40 MG tablet Take 1 tablet (40 mg total) by mouth daily. Take 30-60  minutes before breakfast. 10/30/18   Esterwood, Amy S, PA-C  potassium chloride SA (KLOR-CON M20) 20 MEQ tablet Take 1 tablet (20 mEq total) by mouth daily. 12/10/18   Copland, Gay Filler, MD    Family History Family History  Problem Relation Age of Onset  . Allergic rhinitis Sister   . Diabetes Sister   . Hypertension Sister   . Heart attack Father 66  . Heart disease Father   . Stomach cancer Maternal Grandmother   . Heart disease Mother   . Colon cancer Neg Hx   . Angioedema Neg Hx   . Asthma Neg Hx   . Eczema Neg Hx   . Urticaria Neg Hx   . Immunodeficiency Neg Hx   . Breast cancer Neg Hx   . Migraines Neg Hx   . Headache Neg Hx     Social History Social History   Tobacco Use  . Smoking status: Former Smoker    Packs/day: 0.50    Years: 20.00    Pack years: 10.00    Types: Cigarettes    Quit date: 02/19/1984    Years since quitting: 34.8  . Smokeless tobacco: Never Used  Substance Use Topics  . Alcohol use: No    Frequency: Never  . Drug use: No     Allergies   Shellfish allergy, Ace inhibitors, Pitavastatin, Topiramate, and Sulfa antibiotics   Review of Systems Review of Systems  Constitutional: Negative for chills, diaphoresis, fever and malaise/fatigue.  HENT: Negative for ear pain and sore throat.   Eyes: Negative for pain and visual disturbance.  Respiratory: Negative for cough, hemoptysis and shortness of breath.   Cardiovascular: Positive for palpitations. Negative for chest pain, orthopnea, PND and near-syncope.  Gastrointestinal: Negative for abdominal pain, nausea and vomiting.  Genitourinary: Negative for dysuria and hematuria.  Musculoskeletal: Negative for arthralgias and back pain.  Skin: Negative for color change and rash.  Neurological: Negative for dizziness, seizures, syncope and numbness.  All other systems reviewed and are negative.    Physical Exam Updated Vital Signs BP (!) 159/107   Pulse 63   Temp 98.6 F (37 C) (Oral)    Resp 18   Ht 5\' 5"  (1.651 m)   Wt 91.8 kg   SpO2 99%   BMI 33.68 kg/m   Physical Exam Vitals signs and nursing note reviewed.  Constitutional:      General: She is not in acute distress.    Appearance: She is well-developed.  HENT:     Head: Normocephalic and atraumatic.     Nose: Nose normal.  Eyes:     Extraocular Movements: Extraocular movements intact.     Conjunctiva/sclera: Conjunctivae normal.     Pupils: Pupils are equal, round, and reactive to light.  Neck:     Musculoskeletal: Normal range of motion and neck supple.  Cardiovascular:     Rate and Rhythm: Normal rate and regular rhythm.     Pulses: Normal pulses.  Heart sounds: Normal heart sounds. No murmur.  Pulmonary:     Effort: Pulmonary effort is normal. No respiratory distress.     Breath sounds: Normal breath sounds.  Abdominal:     Palpations: Abdomen is soft.     Tenderness: There is no abdominal tenderness.  Musculoskeletal: Normal range of motion.  Skin:    General: Skin is warm and dry.     Capillary Refill: Capillary refill takes less than 2 seconds.  Neurological:     General: No focal deficit present.     Mental Status: She is alert and oriented to person, place, and time.     Cranial Nerves: No cranial nerve deficit.     Sensory: No sensory deficit.     Motor: No weakness.     Coordination: Coordination normal.     Gait: Gait normal.  Psychiatric:        Mood and Affect: Mood normal.      ED Treatments / Results  Labs (all labs ordered are listed, but only abnormal results are displayed) Labs Reviewed  CBC WITH DIFFERENTIAL/PLATELET  BASIC METABOLIC PANEL    EKG EKG Interpretation  Date/Time:  Tuesday December 22 2018 15:14:50 EST Ventricular Rate:  78 PR Interval:  164 QRS Duration: 86 QT Interval:  374 QTC Calculation: 426 R Axis:   6 Text Interpretation: Normal sinus rhythm Nonspecific ST abnormality Abnormal ECG Confirmed by Lennice Sites [phone]) on 12/22/2018 3:20:29  PM   Radiology No results found.  Procedures Procedures (including critical care time)  Medications Ordered in ED Medications - No data to display   Initial Impression / Assessment and Plan / ED Course  I have reviewed the triage vital signs and the nursing notes.  Pertinent labs & imaging results that were available during my care of the patient were reviewed by me and considered in my medical decision making (see chart for details).        LORELI BENZINGER is a 73 year old female with history of hypertension who presents the ED for palpitations.  Patient with normal vitals.  No fever.  EKG shows sinus rhythm.  No ischemic changes.  Patient has some palpitations earlier today that have resolved.  States that she wore a heart monitor several years ago.  Continues to drink some caffeine.  Denies any chest pain, shortness of breath.  She is overall asymptomatic currently.  No neurological symptoms.  Lab work showed no significant anemia, electrolyte abnormality, kidney injury.  Likely PACs/PVCs.  However follow-up with PCP and cardiology was encouraged to possibly wear heart monitor.  Given return precautions and discharged from ED in good condition.  This chart was dictated using voice recognition software.  Despite best efforts to proofread,  errors can occur which can change the documentation meaning.    Final Clinical Impressions(s) / ED Diagnoses   Final diagnoses:  Palpitations    ED Discharge Orders    None       Lennice Sites, DO 12/22/18 1816

## 2018-12-22 NOTE — Discharge Instructions (Addendum)
Lab work was unremarkable today.  Please do not use any caffeine.  Follow-up with your cardiologist and primary care doctor to discuss wearing a heart monitor as discussed.  Please return to the ED if your symptoms worsen.

## 2018-12-22 NOTE — Patient Instructions (Addendum)
Allergic rhinitis Begin Flonase 2 sprays in each nostril once a day as needed for a stuffy nose Consider saline nasal rinses as needed for nasal symptoms. Use this before any medicated nasal sprays for best result Begin azelastine 2 sprays in each nostril twice a day as needed for nasal symptoms or sinus headache For thick nasal drainage begin Mucinex [phone] mg twice a day as needed and increase hydration to thin mucus.  Heart palpitations Follow up today with your primary care provider or Urgent care where you can get an EKG. Then follow up with your primary care provider for further evaluation and treatment  Call the clinic if this treatment plan is not working well for you  Follow up in 1 month or sooner if needed

## 2018-12-22 NOTE — Progress Notes (Signed)
[address] [place] [address] Dept: [phone]  FOLLOW UP NOTE  Patient ID: Stephanie Parks, female    DOB: 1945-08-14  Age: 73 y.o. MRN: [id number] Date of Office Visit: 12/22/2018  Assessment  Chief Complaint: Nasal Congestion  HPI Stephanie Parks is a 73 year old female who presents to the clinic for evaluation of nasal congestion. She reports that she has been experiencing nasal congestion with no nasal drainage, thick post nasal drainage, and dry cough for the last 3 weeks. She reports an intermittent headache and points to bilateral temple areas. She has been using Flonase intermittently and is not currently using nasal saline rinses or azelastine. She denies fever. She reports feeling intermittent palpitations, like her "heart is racing", which is accompanied by a lightheadedness for the last 1-2 weeks. She denies chest pain. She has recently had a change in her blood pressure medication and reports that her blood pressure reading have varied widely between high and low readings. She last saw her cardiology team via telemedicine visit on 07/21/2018.  Her current medications are listed in the chart.    Drug Allergies:  Allergies  Allergen Reactions  . Shellfish Allergy Hives and Swelling  . Ace Inhibitors Other (See Comments)    Pt cannot recall her reaction but tolerates arb   . Pitavastatin   . Topiramate     Heart race,   . Sulfa Antibiotics Other (See Comments) and Rash    Fine bumps    Physical Exam: BP (!) 120/52   Pulse 92   Temp 98.1 F (36.7 C) (Temporal)   Resp 18   Ht 5' 5.5" (1.664 m)   Wt 202 lb 6.4 oz (91.8 kg)   SpO2 98%   BMI 33.17 kg/m    Physical Exam Vitals signs reviewed.  Constitutional:      Appearance: Normal appearance.  HENT:     Head: Normocephalic and atraumatic.     Right Ear: Tympanic membrane normal.     Left Ear: Tympanic membrane normal.     Nose:     Comments: Bilateral nares slightly erythematous with no nasal  drainage noted. Pharynx normal. Ears normal. Eyes normal.    Mouth/Throat:     Pharynx: Oropharynx is clear.  Eyes:     Conjunctiva/sclera: Conjunctivae normal.  Neck:     Musculoskeletal: Normal range of motion and neck supple.  Cardiovascular:     Rate and Rhythm: Normal rate and regular rhythm.     Heart sounds: Normal heart sounds. No murmur.  Pulmonary:     Effort: Pulmonary effort is normal.     Breath sounds: Normal breath sounds.     Comments: Lungs clear to auscultation Musculoskeletal: Normal range of motion.  Skin:    General: Skin is warm and dry.  Neurological:     Mental Status: She is alert and oriented to person, place, and time.  Psychiatric:        Mood and Affect: Mood normal.        Behavior: Behavior normal.        Thought Content: Thought content normal.        Judgment: Judgment normal.     Assessment and Plan: 1. Seasonal and perennial allergic rhinitis   2. Heart palpitations   3. Coronary artery disease involving native coronary artery of native heart without angina pectoris   4. Dizziness   5. History of chest pain   6. Hyperlipidemia, unspecified hyperlipidemia type  Meds ordered this encounter  Medications  . fluticasone (FLONASE) 50 MCG/ACT nasal spray    Sig: USE 2 SPRAYS IN NOSTRIL(S) DAILY AS NEEDED.    Dispense:  16 g    Refill:  9  . Azelastine HCl 0.15 % SOLN    Sig: Place 1-2 sprays into both nostrils 2 (two) times daily.    Dispense:  30 mL    Refill:  5    Patient Instructions  Allergic rhinitis Begin Flonase 2 sprays in each nostril once a day as needed for a stuffy nose Consider saline nasal rinses as needed for nasal symptoms. Use this before any medicated nasal sprays for best result Begin azelastine 2 sprays in each nostril twice a day as needed for nasal symptoms or sinus headache For thick nasal drainage begin Mucinex [phone] mg twice a day as needed and increase hydration to thin mucus.  Heart palpitations  Follow up today with your primary care provider or Urgent care where you can get an EKG. Then follow up with your primary care provider for further evaluation and treatment  Call the clinic if this treatment plan is not working well for you  Follow up in 1 month or sooner if needed   Return in about 4 weeks (around 01/19/2019), or if symptoms worsen or fail to improve.    Thank you for the opportunity to care for this patient.  Please do not hesitate to contact me with questions.  Penne Lash, M.D.  [facility] [address] [place],  [address] [phone]

## 2018-12-22 NOTE — ED Triage Notes (Signed)
Palpitations x 4 months. States she has been here for evaluation and no one can find what's causing her palpitations. She has a cardiologist but states she can't get in to see them.

## 2018-12-23 ENCOUNTER — Other Ambulatory Visit: Payer: Self-pay | Admitting: *Deleted

## 2018-12-23 NOTE — Patient Outreach (Signed)
Telephone outreach unsuccessful. Was able to leave a voicemail and requested that pt return my call. PT was in the ED yesterday, it is not determined if she is home or still there.  Stephanie Parks. [name], MSN, [facility] Gerontological Nurse Practitioner [facility] Care Management [phone]

## 2018-12-24 ENCOUNTER — Other Ambulatory Visit: Payer: Self-pay | Admitting: *Deleted

## 2018-12-24 NOTE — Patient Outreach (Signed)
Stephanie Parks returned my call today. She reports she has been to the ED on 12/21/18 for lightheadedness and on 12/22/18 for palpitations, she was sent to the ED from the [place] office.  She continues to have HAs, lightheadedness and occasional episodes of palpitations.  She says she is so depressed because of her health and now not being able to drive safely. She is applying for a LOA from her work. She has also scheduled a new neurology consult with Dr.Edwards, Brooke Bonito in the [facility] hospital system which will be on 01/07/19.  We talked about her depression. She says she did talk to Dr. Edilia Bo about this. Some time ago Dr. Edilia Bo did prescribe an antidepressant and [name] never started it. She does have the sertraline 25 mg. Today I have advised her to start taking the 25 mg daily for one week and if she is tolerating it OK to increase the dose to 50 mg. She should see Dr. Edilia Bo after 4 weeks to re-evaluate her depression level and success or lack of with the medication.  Have advised her that I will let Dr. Edilia Bo know of my suggestion to increase the sertraline after a week to ensure she is in agreement with my recommendation.  I will call Mrs. Seel next Thursday.  [name]. [name], MSN, [facility] Gerontological Nurse Practitioner [facility] Care Management [phone]

## 2018-12-25 ENCOUNTER — Ambulatory Visit: Payer: Self-pay

## 2018-12-25 ENCOUNTER — Ambulatory Visit (INDEPENDENT_AMBULATORY_CARE_PROVIDER_SITE_OTHER): Payer: Medicare Other

## 2018-12-25 ENCOUNTER — Other Ambulatory Visit: Payer: Self-pay

## 2018-12-25 DIAGNOSIS — J309 Allergic rhinitis, unspecified: Secondary | ICD-10-CM

## 2018-12-25 NOTE — Progress Notes (Signed)
Immunotherapy   Patient Details  Name: Stephanie Parks MRN: [id number] Date of Birth: [birth year]  12/25/2018  [name] Patient picked up vial [id number] Following schedule: C  Frequency:Weekly until she reaches .50 then every 4 weeks Epi-Pen:Yes  Consent signed and patient instructions given.   [name] 12/25/2018, 9:34 AM

## 2018-12-26 ENCOUNTER — Other Ambulatory Visit: Payer: Medicare Other

## 2018-12-28 ENCOUNTER — Telehealth: Payer: Self-pay | Admitting: Neurology

## 2018-12-28 DIAGNOSIS — K449 Diaphragmatic hernia without obstruction or gangrene: Secondary | ICD-10-CM | POA: Diagnosis not present

## 2018-12-28 DIAGNOSIS — R0789 Other chest pain: Secondary | ICD-10-CM | POA: Diagnosis not present

## 2018-12-28 DIAGNOSIS — I671 Cerebral aneurysm, nonruptured: Secondary | ICD-10-CM | POA: Diagnosis not present

## 2018-12-28 DIAGNOSIS — R079 Chest pain, unspecified: Secondary | ICD-10-CM | POA: Diagnosis not present

## 2018-12-28 DIAGNOSIS — I25118 Atherosclerotic heart disease of native coronary artery with other forms of angina pectoris: Secondary | ICD-10-CM | POA: Diagnosis not present

## 2018-12-28 DIAGNOSIS — I214 Non-ST elevation (NSTEMI) myocardial infarction: Secondary | ICD-10-CM | POA: Diagnosis not present

## 2018-12-28 DIAGNOSIS — K219 Gastro-esophageal reflux disease without esophagitis: Secondary | ICD-10-CM | POA: Diagnosis not present

## 2018-12-28 DIAGNOSIS — R002 Palpitations: Secondary | ICD-10-CM | POA: Diagnosis not present

## 2018-12-28 DIAGNOSIS — Z91013 Allergy to seafood: Secondary | ICD-10-CM | POA: Diagnosis not present

## 2018-12-28 DIAGNOSIS — L7602 Intraoperative hemorrhage and hematoma of skin and subcutaneous tissue complicating other procedure: Secondary | ICD-10-CM | POA: Diagnosis not present

## 2018-12-28 DIAGNOSIS — I493 Ventricular premature depolarization: Secondary | ICD-10-CM | POA: Diagnosis not present

## 2018-12-28 DIAGNOSIS — R001 Bradycardia, unspecified: Secondary | ICD-10-CM | POA: Diagnosis not present

## 2018-12-28 DIAGNOSIS — E876 Hypokalemia: Secondary | ICD-10-CM | POA: Diagnosis not present

## 2018-12-28 DIAGNOSIS — Z8249 Family history of ischemic heart disease and other diseases of the circulatory system: Secondary | ICD-10-CM | POA: Diagnosis not present

## 2018-12-28 DIAGNOSIS — R0602 Shortness of breath: Secondary | ICD-10-CM | POA: Diagnosis not present

## 2018-12-28 DIAGNOSIS — I498 Other specified cardiac arrhythmias: Secondary | ICD-10-CM | POA: Diagnosis not present

## 2018-12-28 DIAGNOSIS — I208 Other forms of angina pectoris: Secondary | ICD-10-CM | POA: Diagnosis not present

## 2018-12-28 DIAGNOSIS — J90 Pleural effusion, not elsewhere classified: Secondary | ICD-10-CM | POA: Diagnosis not present

## 2018-12-28 DIAGNOSIS — E785 Hyperlipidemia, unspecified: Secondary | ICD-10-CM | POA: Diagnosis not present

## 2018-12-28 DIAGNOSIS — I10 Essential (primary) hypertension: Secondary | ICD-10-CM | POA: Diagnosis not present

## 2018-12-28 DIAGNOSIS — Z87891 Personal history of nicotine dependence: Secondary | ICD-10-CM | POA: Diagnosis not present

## 2018-12-28 DIAGNOSIS — I771 Stricture of artery: Secondary | ICD-10-CM | POA: Diagnosis not present

## 2018-12-28 NOTE — Telephone Encounter (Signed)
I have not received sleep study results on this pt yet but will let Dr. Rexene Alberts know.

## 2018-12-28 NOTE — Telephone Encounter (Signed)
I called pt and advised her that her sleep study results are not available yet but I will call her as soon as they are. Pt verbalized understanding.

## 2018-12-28 NOTE — Telephone Encounter (Signed)
Pt is asking for a call with the results to her sleep study 

## 2018-12-29 HISTORY — PX: CARDIAC CATHETERIZATION: SHX172

## 2018-12-30 ENCOUNTER — Telehealth: Payer: Self-pay

## 2018-12-30 NOTE — Procedures (Signed)
PATIENT'S NAME:  Stephanie Parks, Stephanie Parks DOB:      May 23, 1945      MR#:    [id number]     DATE OF RECORDING: 12/17/2018 REFERRING M.D.:  Amy Ubaldo Glassing, NP Study Performed:   Baseline Polysomnogram HISTORY: 73 year old right-handed woman with an underlying medical history of hypertension, hyperlipidemia, reflux disease, diverticulosis, coronary artery disease, allergic rhinitis, migraine headaches, osteoarthritis, history of brain aneurysm and obesity, who reports snoring and excessive daytime sleepiness. The patient endorsed the Epworth Sleepiness Scale at 7 points. The patient's weight 211 pounds with a height of 65 (inches), resulting in a BMI of 35.3 kg/m2. The patient's neck circumference measured 15.5 inches.  CURRENT MEDICATIONS: Tylenol 3, Fosamax, Aspirin, Calcium, Flonase, Cozaar, Multivitamins, Protonix, Senokot, Zoloft, Topamax, Imitrex   PROCEDURE:  This is a multichannel digital polysomnogram utilizing the Somnostar 11.2 system.  Electrodes and sensors were applied and monitored per AASM Specifications.   EEG, EOG, Chin and Limb EMG, were sampled at 200 Hz.  ECG, Snore and Nasal Pressure, Thermal Airflow, Respiratory Effort, CPAP Flow and Pressure, Oximetry was sampled at 50 Hz. Digital video and audio were recorded.      BASELINE STUDY  Lights Out was at 22:12 and Lights On at 05:01.  Total recording time (TRT) was 409 minutes, with a total sleep time (TST) of 327.5 minutes.   The patient's sleep latency was 34 minutes, which is delayed. REM latency was 48.5 minutes, which is mildly reduced. The sleep efficiency was 80.1 %.     SLEEP ARCHITECTURE: WASO (Wake after sleep onset) was 55 minutes with one longer period of wakefulness and otherwise mild sleep fragmentation noted. There were 17.5 minutes in Stage N1, 240 minutes Stage N2, 0 minutes Stage N3 and 70 minutes in Stage REM.  The percentage of Stage N1 was 5.3%, Stage N2 was 73.3%, which is increased, Stage N3 was absent, and Stage R (REM  sleep) was 21.4%, which is normal. The arousals were noted as: 24 were spontaneous, 0 were associated with PLMs, 24 were associated with respiratory events.  RESPIRATORY ANALYSIS:  There were a total of 55 respiratory events:  30 obstructive apneas, 0 central apneas and 1 mixed apneas with a total of 31 apneas and an apnea index (AI) of 5.7 /hour. There were 24 hypopneas with a hypopnea index of 4.4 /hour. The patient also had 0 respiratory event related arousals (RERAs).      The total APNEA/HYPOPNEA INDEX (AHI) was 10.1/hour and the total RESPIRATORY DISTURBANCE INDEX was 10.1 /hour.  10 events occurred in REM sleep and 41 events in NREM. The REM AHI was 8.6 /hour, versus a non-REM AHI of 10.5. The patient spent 94.5 minutes of total sleep time in the supine position and 233 minutes in non-supine.. The supine AHI was 21.0 versus a non-supine AHI of 5.7.  OXYGEN SATURATION & C02:  The Wake baseline 02 saturation was 97%, with the lowest being 78%. Time spent below 89% saturation equaled 4 minutes. PERIODIC LIMB MOVEMENTS: The patient had a total of 0 Periodic Limb Movements.  The Periodic Limb Movement (PLM) index was 0 and the PLM Arousal index was 0/hour.  Audio and video analysis did not show any abnormal or unusual movements, behaviors, phonations or vocalizations. The patient took 1 bathroom break. Mild to moderate snoring was noted. The EKG was in keeping with normal sinus rhythm (NSR).  Post-study, the patient indicated that sleep was the same as usual.   IMPRESSION:  1. Obstructive Sleep Apnea (OSA) 2.  Dysfunctions associated with sleep stages or arousal from sleep  RECOMMENDATIONS:  1. This study demonstrates overall mild obstructive sleep apnea, moderate during supine sleep with a total AHI of 10.1/hour, supine AHI of 21/hour, REM AHI of 8.6/hour, and O2 nadir of 78%. Given the patient's medical history and sleep related complaints, treatment with positive airway pressure is  recommended; a full-night CPAP titration study is advised to optimize therapy. Other treatment options may include avoidance of supine sleep position along with weight loss, upper airway or jaw surgery in selected patients or the use of an oral appliance in certain patients. ENT evaluation and/or consultation with a maxillofacial surgeon or dentist may be feasible in some instances.    2. This study shows sleep fragmentation and abnormal sleep stage percentages; these are nonspecific findings and per se do not signify an intrinsic sleep disorder or a cause for the patient's sleep-related symptoms. Causes include (but are not limited to) the first night effect of the sleep study, circadian rhythm disturbances, medication effect or an underlying mood disorder or medical problem.  3. The patient should be cautioned not to drive, work at heights, or operate dangerous or heavy equipment when tired or sleepy. Review and reiteration of good sleep hygiene measures should be pursued with any patient. 4. The patient will be seen in follow-up in the sleep clinic at [facility] for discussion of the test results, symptom and treatment compliance review, further management strategies, etc. The referring provider will be notified of the test results.  I certify that I have reviewed the entire raw data recording prior to the issuance of this report in accordance with the Standards of Accreditation of the American Academy of Sleep Medicine (AASM)   Star Age, MD, PhD Diplomat, American Board of Neurology and Sleep Medicine (Neurology and Sleep Medicine)

## 2018-12-30 NOTE — Addendum Note (Signed)
Addended by: Star Age on: 12/30/2018 08:04 AM   Modules accepted: Orders

## 2018-12-30 NOTE — Progress Notes (Signed)
Patient referred by Amy L, NP, seen by me on 11/24/18, diagnostic PSG on 12/17/18.   Please call and notify the patient that the recent sleep study showed mild to moderate obstructive sleep apnea. I recommend treatment for this in the form of CPAP. This will require a repeat sleep study for proper titration and mask fitting and correct monitoring of the oxygen saturations. Please explain to patient. I have placed an order in the chart. Thanks.  Star Age, MD, PhD [facility])

## 2018-12-30 NOTE — Telephone Encounter (Signed)
-----   Message from Star Age, MD sent at 12/30/2018  8:04 AM EST ----- Patient referred by Amy L, NP, seen by me on 11/24/18, diagnostic PSG on 12/17/18.   Please call and notify the patient that the recent sleep study showed mild to moderate obstructive sleep apnea. I recommend treatment for this in the form of CPAP. This will require a repeat sleep study for proper titration and mask fitting and correct monitoring of the oxygen saturations. Please explain to patient. I have placed an order in the chart. Thanks.  Star Age, MD, PhD [facility])

## 2018-12-30 NOTE — Telephone Encounter (Signed)
I called Stephanie Parks. I advised Stephanie Parks that Dr. Rexene Alberts reviewed their sleep study results and found that Stephanie Parks has mild to moderate osa and recommends that Stephanie Parks be treated with a cpap. Dr. Rexene Alberts recommends that Stephanie Parks return for a repeat sleep study in order to properly titrate the cpap and ensure a good mask fit. Stephanie Parks is agreeable to returning for a titration study. I advised Stephanie Parks that our sleep lab will file with Stephanie Parks's insurance and call Stephanie Parks to schedule the sleep study when we hear back from the Stephanie Parks's insurance regarding coverage of this sleep study. Stephanie Parks verbalized understanding of results. Stephanie Parks had no questions at this time but was encouraged to call back if questions arise.

## 2018-12-30 NOTE — Telephone Encounter (Signed)
I called pt to discuss her sleep study results. No answer, left a message asking her to call me back. 

## 2018-12-31 ENCOUNTER — Telehealth: Payer: Self-pay | Admitting: Family Medicine

## 2018-12-31 MED ORDER — DEXTROSE 10 % IV SOLN
125.00 | INTRAVENOUS | Status: DC
Start: ? — End: 2018-12-31

## 2018-12-31 MED ORDER — PANTOPRAZOLE SODIUM 40 MG PO TBEC
40.00 | DELAYED_RELEASE_TABLET | ORAL | Status: DC
Start: 2019-01-01 — End: 2018-12-31

## 2018-12-31 MED ORDER — ASPIRIN EC 81 MG PO TBEC
81.00 | DELAYED_RELEASE_TABLET | ORAL | Status: DC
Start: 2019-01-01 — End: 2018-12-31

## 2018-12-31 MED ORDER — FLUTICASONE PROPIONATE 50 MCG/ACT NA SUSP
2.00 | NASAL | Status: DC
Start: 2019-01-01 — End: 2018-12-31

## 2018-12-31 MED ORDER — LOSARTAN POTASSIUM 50 MG PO TABS
100.00 | ORAL_TABLET | ORAL | Status: DC
Start: 2019-01-01 — End: 2018-12-31

## 2018-12-31 MED ORDER — NITROGLYCERIN 0.4 MG SL SUBL
0.40 | SUBLINGUAL_TABLET | SUBLINGUAL | Status: DC
Start: ? — End: 2018-12-31

## 2018-12-31 MED ORDER — ROSUVASTATIN CALCIUM 20 MG PO TABS
20.00 | ORAL_TABLET | ORAL | Status: DC
Start: 2018-12-31 — End: 2018-12-31

## 2018-12-31 MED ORDER — SENNOSIDES-DOCUSATE SODIUM 8.6-50 MG PO TABS
2.00 | ORAL_TABLET | ORAL | Status: DC
Start: 2018-12-31 — End: 2018-12-31

## 2018-12-31 MED ORDER — HYDROCODONE-ACETAMINOPHEN 5-325 MG PO TABS
1.00 | ORAL_TABLET | ORAL | Status: DC
Start: ? — End: 2018-12-31

## 2018-12-31 MED ORDER — GUAIFENESIN ER 600 MG PO TB12
600.00 | ORAL_TABLET | ORAL | Status: DC
Start: ? — End: 2018-12-31

## 2018-12-31 MED ORDER — POLYETHYLENE GLYCOL 3350 17 G PO PACK
17.00 | PACK | ORAL | Status: DC
Start: ? — End: 2018-12-31

## 2018-12-31 MED ORDER — MAGNESIUM HYDROXIDE 400 MG/5ML PO SUSP
30.00 | ORAL | Status: DC
Start: ? — End: 2018-12-31

## 2018-12-31 MED ORDER — GENERIC EXTERNAL MEDICATION
30.00 | Status: DC
Start: ? — End: 2018-12-31

## 2018-12-31 MED ORDER — ACETAMINOPHEN 325 MG PO TABS
650.00 | ORAL_TABLET | ORAL | Status: DC
Start: ? — End: 2018-12-31

## 2018-12-31 MED ORDER — METOPROLOL SUCCINATE ER 25 MG PO TB24
25.00 | ORAL_TABLET | ORAL | Status: DC
Start: ? — End: 2018-12-31

## 2018-12-31 MED ORDER — GENERIC EXTERNAL MEDICATION
12.50 | Status: DC
Start: 2019-01-01 — End: 2018-12-31

## 2018-12-31 NOTE — Telephone Encounter (Signed)
Tried calling patient, received paperwork however we were unaware she needed a leave of absence so in order to fill out the forms I was needing a little bit more information regarding what exactly she needs from [place]. Left voicemail for patient to call back.

## 2018-12-31 NOTE — Telephone Encounter (Signed)
°  Reason for call:  [employer] faxed over Leave of Absence form on Monday, patient would like to know if receive

## 2019-01-01 NOTE — Telephone Encounter (Signed)
Patient called back, spoke with her regarding leave of absence paperwork. She states she is needing a leave due to her daily headaches and dizziness. She was just seen in the hospital and released yesterday for chest pain. She states she is going to see a neurologist as this has been going on for almost 6 months.She has had multiple scans which all were normal. She just does not understanding what is going on and needs to focus on her health right now.

## 2019-01-02 NOTE — Telephone Encounter (Signed)
Completed her paperwork today- will have her out for 12 weeks starting 12/28/2018- 03/21/2018

## 2019-01-05 ENCOUNTER — Other Ambulatory Visit: Payer: Self-pay | Admitting: *Deleted

## 2019-01-05 NOTE — Patient Outreach (Signed)
Telephone outreach. In reviewing my pt chart I see she had a hospitalization at [facility] for chest pain. She had a heart catherization without significant findings.   Unable to speak with Stephanie Parks at this time, but left a message and requested a return call.  [name]. [name], MSN, [facility] Gerontological Nurse Practitioner [facility] Care Management [phone]

## 2019-01-06 ENCOUNTER — Encounter: Payer: Self-pay | Admitting: *Deleted

## 2019-01-06 NOTE — Patient Outreach (Signed)
[facility]) Care Management  01/06/2019  Stephanie Parks 1945/03/05 [id number]  Second outreach attempt without success. Left a message and requested a return call.  [name]. [name], MSN, [facility] Gerontological Nurse Practitioner [facility] Care Management [phone]

## 2019-01-07 ENCOUNTER — Telehealth: Payer: Self-pay

## 2019-01-07 DIAGNOSIS — R42 Dizziness and giddiness: Secondary | ICD-10-CM | POA: Diagnosis not present

## 2019-01-07 DIAGNOSIS — Z8679 Personal history of other diseases of the circulatory system: Secondary | ICD-10-CM | POA: Diagnosis not present

## 2019-01-07 DIAGNOSIS — G4489 Other headache syndrome: Secondary | ICD-10-CM | POA: Diagnosis not present

## 2019-01-07 DIAGNOSIS — M2550 Pain in unspecified joint: Secondary | ICD-10-CM | POA: Diagnosis not present

## 2019-01-07 DIAGNOSIS — G518 Other disorders of facial nerve: Secondary | ICD-10-CM | POA: Diagnosis not present

## 2019-01-07 DIAGNOSIS — M6281 Muscle weakness (generalized): Secondary | ICD-10-CM | POA: Diagnosis not present

## 2019-01-07 DIAGNOSIS — M316 Other giant cell arteritis: Secondary | ICD-10-CM | POA: Diagnosis not present

## 2019-01-07 DIAGNOSIS — R5383 Other fatigue: Secondary | ICD-10-CM | POA: Diagnosis not present

## 2019-01-07 NOTE — Telephone Encounter (Signed)
Called patient to inform her that paperwork has been faxed regarding leave of absence paperwork that has been faxed. Left detailed message.

## 2019-01-07 NOTE — Telephone Encounter (Signed)
Copied from [place] [phone]. Topic: General - Other >> Jan 06, 2019 12:22 PM Leward Quan A wrote: Reason for CRM: Patient called to inquire if paper work was sent in to her employer for her Leave of absence as of yet. Please call patient with an update at Ph# [phone]

## 2019-01-11 ENCOUNTER — Encounter: Payer: Self-pay | Admitting: Family Medicine

## 2019-01-11 ENCOUNTER — Ambulatory Visit (HOSPITAL_BASED_OUTPATIENT_CLINIC_OR_DEPARTMENT_OTHER)
Admission: RE | Admit: 2019-01-11 | Discharge: 2019-01-11 | Disposition: A | Discharge status: Home / Self Care | Payer: Medicare Other | Source: Ambulatory Visit | Attending: Family Medicine | Admitting: Family Medicine

## 2019-01-11 ENCOUNTER — Other Ambulatory Visit: Payer: Self-pay | Admitting: *Deleted

## 2019-01-11 ENCOUNTER — Ambulatory Visit (INDEPENDENT_AMBULATORY_CARE_PROVIDER_SITE_OTHER): Payer: Medicare Other | Admitting: Family Medicine

## 2019-01-11 ENCOUNTER — Other Ambulatory Visit: Payer: Self-pay

## 2019-01-11 ENCOUNTER — Other Ambulatory Visit (HOSPITAL_COMMUNITY)
Admission: RE | Admit: 2019-01-11 | Discharge: 2019-01-11 | Disposition: A | Discharge status: Home / Self Care | Payer: Medicare Other | Source: Ambulatory Visit | Attending: Family Medicine | Admitting: Family Medicine

## 2019-01-11 VITALS — BP 128/80 | HR 69 | Temp 97.8°F | Resp 17 | Ht 65.0 in | Wt 198.0 lb

## 2019-01-11 DIAGNOSIS — E876 Hypokalemia: Secondary | ICD-10-CM | POA: Diagnosis not present

## 2019-01-11 DIAGNOSIS — R634 Abnormal weight loss: Secondary | ICD-10-CM | POA: Diagnosis not present

## 2019-01-11 DIAGNOSIS — N898 Other specified noninflammatory disorders of vagina: Secondary | ICD-10-CM | POA: Diagnosis not present

## 2019-01-11 DIAGNOSIS — J90 Pleural effusion, not elsewhere classified: Secondary | ICD-10-CM | POA: Insufficient documentation

## 2019-01-11 LAB — COMPREHENSIVE METABOLIC PANEL
ALT: 12 U/L (ref 0–35)
AST: 14 U/L (ref 0–37)
Albumin: 3.8 g/dL (ref 3.5–5.2)
Alkaline Phosphatase: 66 U/L (ref 39–117)
BUN: 14 mg/dL (ref 6–23)
CO2: 34 mEq/L — ABNORMAL HIGH (ref 19–32)
Calcium: 9.7 mg/dL (ref 8.4–10.5)
Chloride: 99 mEq/L (ref 96–112)
Creatinine, Ser: 0.91 mg/dL (ref 0.40–1.20)
GFR: 73.27 mL/min (ref >60.00)
Glucose, Bld: 94 mg/dL (ref 70–99)
Potassium: 3.9 mEq/L (ref 3.5–5.1)
Sodium: 142 mEq/L (ref 135–145)
Total Bilirubin: 0.4 mg/dL (ref 0.2–1.2)
Total Protein: 7 g/dL (ref 6.0–8.3)

## 2019-01-11 MED ORDER — METRONIDAZOLE 0.75 % VA GEL: 1.0000 | Freq: Every day | VAGINAL | 0 refills | Status: DC

## 2019-01-11 NOTE — Patient Instructions (Signed)
It was good to see you again today I will be in touch with your labs and x-ray asap We are going to treat you for possible bacterial vaginosis with metronidazole gel- use this at bedtime for 5 days I am going to look for any clues regarding your weight loss that we might find today.  Assuming all ok I think seeing GI is the next step

## 2019-01-11 NOTE — Patient Outreach (Signed)
Return call from Mrs. Bergh post hospitalization at [facility]..  She reports she has been staying with relatives and that was the reason I was unable to contact her. She has also had another hospitalization, this one at [facility].  She has been to the neurologist who started her on an Rx of prednisone and also gave her bilateral temporal steroid injections.  She reports she doesn't really have pain but she has light-headedness again. She reports she had minimal light-headedness over the weekend.  Her blood pressure at 6 am today was: 112/57 pulse 58 At 10:00 am: 127/56 pulse 65  The cardiologist at [place] made the following medication additions and dose changes:  1) Increased her losartan/hctz 50/12.5 to BID 2)Added spironolactone 25 mg 1/2 tab daily. 3) Restarted Rosuvastatin 20 mg daily  Patient was recently discharged from hospital and all medications have been reviewed. Outpatient Encounter Medications as of 01/11/2019  Medication Sig Note  . aspirin 81 MG tablet Take 81 mg by mouth daily.   . Azelastine HCl 0.15 % SOLN Place 1-2 sprays into both nostrils 2 (two) times daily.   . Calcium Carbonate (CALCIUM 600 PO) Take 1 tablet by mouth daily.   . fluticasone (FLONASE) 50 MCG/ACT nasal spray USE 2 SPRAYS IN NOSTRIL(S) DAILY AS NEEDED.   Marland Kitchen losartan-hydrochlorothiazide (HYZAAR) 50-12.5 MG tablet Take 1 tablet by mouth daily. 01/11/2019: This medication was increased to BID during her [facility] hospitalization. [facility]  . Multiple Vitamins-Minerals (MULTIVITAL) tablet Take by mouth.   . NONFORMULARY OR COMPOUNDED ITEM Allergen immunotherapy   . pantoprazole (PROTONIX) 40 MG tablet Take 1 tablet (40 mg total) by mouth daily. Take 30-60 minutes before breakfast.   . potassium chloride SA (KLOR-CON M20) 20 MEQ tablet Take 1 tablet (20 mEq total) by mouth daily.   . rosuvastatin (CRESTOR) 20 MG tablet Take 20 mg by mouth daily.   Marland Kitchen spironolactone (ALDACTONE) 25 MG tablet Take 12.5 mg by  mouth daily. Started at [facility] hospitalzation.    . [DISCONTINUED] acetaminophen-codeine (TYLENOL #3) 300-30 MG tablet Take 1-2 tablets by mouth every 8 (eight) hours as needed for moderate pain.    No facility-administered encounter medications on file as of 01/11/2019.    Pt reports a new problem of vaginal odor that is very noticeable to her and worrisome. She denies douching or having any vaginal inserts. She is not sexually active. She asks that I schedule an appt for her with Dr. Edilia Bo. Appt made for today at 1:45. Advised pt of time and to arrive at 1:30.  THN CM Care Plan Problem One     Most Recent Value  Care Plan Problem One  Migraine Headaches  Role Documenting the Problem One  Care Management [place] for Problem One  Active  THN Long Term Goal   Pt will discover new methods to relieve or cope with her headaches over the next 90 days.  THN Long Term Goal Start Date  11/25/18  Interventions for Problem One Long Term Goal  Complete prednisone taper. Keep a record of HA occurence, pain left, location, duration, corresponding events. Follow up with neurology as scheduled.  THN CM Short Term Goal #1   Pt to find out from her carrier if she can have alternative medical tx: massage over the next 30 days..  THN CM Short Term Goal #1 Start Date  11/25/18  Interventions for Short Term Goal #1  NP looked up tx and no alternative method is covered for HA. Reported to  pt.    THN CM Care Plan Problem Two     Most Recent Value  Care Plan Problem Two  HTN  Role Documenting the Problem Two  Care Management Coordinator  Care Plan for Problem Two  Active  THN Long Term Goal  Will not have to go to the ED with elevated blood pressure over the next 90 days.  THN Long Term Goal Start Date  11/25/18     Requested that pt return my call with results of her exam.  I will be calling her next week.  [name]. [name], MSN, [facility] Gerontological Nurse Practitioner [facility] Care  Management [phone]

## 2019-01-11 NOTE — Progress Notes (Signed)
[place] at [facility] [address] [place], [place] [address] [phone]  Date:  01/11/2019   Name:  Stephanie Parks   DOB:  [birth year]   MRN:  [id number]  PCP:  [name], MD    Chief Complaint: Vaginal Odor (couple of weeks, no uti symptoms, no abnormal discharge)   History of Present Illness:  Stephanie Parks is a 73 y.o. very pleasant female patient who presents with the following:  Pt with history of hypertension, brain aneurysm, CAD, hyperlipidemia Here today with concern of a vaginal odor  She was admitted at [facility] earlier this month from 11/9-11/12 with chest pain She had a negative heart cath- she is therefore not thought to have had an STEMI despite initial concern. She was noted to have hypokalemia on admission which was treated  She saw her neurologist recently and they "put a needle in my temple and put me on steroids" for my headaches.  This does seem to have helped some Today she is concerned about a vaginal odor She has noted this for about 3 weeks, as well as itching and irritation, as well as some darkening of her skin in the pubic area No urinary sx No discharge   She still has a small "knot" on her right groin from her recent heart cath  This area is uncomfortable but not painful  Wt Readings from Last 3 Encounters:  01/11/19 198 lb (89.8 kg)  12/22/18 202 lb 6.1 oz (91.8 kg)  12/22/18 202 lb 6.4 oz (91.8 kg)   She has lost weight- was 214 in September she is NOT trying to lose weight.  She is eating last as she often does not feel hungry She is not having abdominal pain, is not vomiting  She is taking potassium 20 meq daily  Aspirin 81 Toprol-XL Crestor Spironolactone 12.5  She is seeing her GI next week.  Today we are not entirely clear on her medications, I think there were some more changes from her recent admission.  She will bring her current medication to her upcoming  visit next week with GI so her meds can be confirmed   Patient Active Problem List   Diagnosis Date Noted  . Seasonal and perennial allergic rhinitis 12/22/2018  . Heart palpitations 12/22/2018  . History of chest pain 07/20/2018  . Brain aneurysm 07/20/2018  . Bronchitis 04/23/2018  . Tendonitis, Achilles, right 12/08/2017  . Onychomycosis 12/08/2017  . Unilateral primary osteoarthritis, left knee 10/07/2017  . Ingrown toenail 08/10/2017  . Coughing 04/24/2017  . CAD (coronary artery disease) 03/01/2015  . Aneurysm ([place]) 03/01/2015  . Dizziness 03/01/2015  . Paresthesia of arm 03/01/2015  . Allergic rhinitis 03/01/2015  . Achalasia 09/28/2012  . CN (constipation) 09/28/2012  . Cephalalgia 08/24/2012  . HTN (hypertension) 04/19/2011  . Hyperlipidemia 04/19/2011  . GERD (gastroesophageal reflux disease) 04/19/2011  . Chest pain 04/04/2011    Past Medical History:  Diagnosis Date  . 3-vessel CAD 03/01/2015  . Achalasia 09/28/2012  . Allergic rhinitis 03/01/2015  . Aneurysm ([place]) 03/01/2015  . BP (high blood pressure) 03/01/2015  . Bronchitis 04/23/2018  . Cephalalgia 08/24/2012   Overview:  ICD-10 cut over    . Chest pain 04/04/2011  . CN (constipation) 09/28/2012  . Coughing 04/24/2017  . Decreased potassium in the blood 03/01/2015  . Diverticulosis   . Dizziness 03/01/2015  . GERD (gastroesophageal reflux disease)   . Hiatal hernia   .  Hypercholesterolemia 03/01/2015  . Hyperlipidemia   . Hypertension   . Ingrown toenail 08/10/2017  . Inguinal hernia   . Migraine headache   . Onychomycosis 12/08/2017  . Paresthesia of arm 03/01/2015  . Schatzki's ring   . Tendonitis, Achilles, right 12/08/2017  . Unilateral primary osteoarthritis, left knee 10/07/2017    Past Surgical History:  Procedure Laterality Date  . ABDOMINAL HYSTERECTOMY    . BACK SURGERY     x 3  . CARPAL TUNNEL RELEASE     left wrist  . CERVICAL SPINE SURGERY    . HEMORRHOID SURGERY      Social History    Tobacco Use  . Smoking status: Former Smoker    Packs/day: 0.50    Years: 20.00    Pack years: 10.00    Types: Cigarettes    Quit date: 02/19/1984    Years since quitting: 34.9  . Smokeless tobacco: Never Used  Substance Use Topics  . Alcohol use: No    Frequency: Never  . Drug use: No    Family History  Problem Relation Age of Onset  . Allergic rhinitis Sister   . Diabetes Sister   . Hypertension Sister   . Heart attack Father 62  . Heart disease Father   . Stomach cancer Maternal Grandmother   . Heart disease Mother   . Colon cancer Neg Hx   . Angioedema Neg Hx   . Asthma Neg Hx   . Eczema Neg Hx   . Urticaria Neg Hx   . Immunodeficiency Neg Hx   . Breast cancer Neg Hx   . Migraines Neg Hx   . Headache Neg Hx     Allergies  Allergen Reactions  . Shellfish Allergy Hives and Swelling  . Ace Inhibitors Other (See Comments)    Pt cannot recall her reaction but tolerates arb   . Pitavastatin   . Topiramate     Heart race,   . Sulfa Antibiotics Other (See Comments) and Rash    Fine bumps    Medication list has been reviewed and updated.  Current Outpatient Medications on File Prior to Visit  Medication Sig Dispense Refill  . aspirin 81 MG tablet Take 81 mg by mouth daily.    . Azelastine HCl 0.15 % SOLN Place 1-2 sprays into both nostrils 2 (two) times daily. 30 mL 5  . Calcium Carbonate (CALCIUM 600 PO) Take 1 tablet by mouth daily.    . fluticasone (FLONASE) 50 MCG/ACT nasal spray USE 2 SPRAYS IN NOSTRIL(S) DAILY AS NEEDED. 16 g 9  . metoprolol succinate (TOPROL-XL) 25 MG 24 hr tablet Take by mouth.    . Multiple Vitamins-Minerals (MULTIVITAL) tablet Take by mouth.    . NONFORMULARY OR COMPOUNDED ITEM Allergen immunotherapy 2 each 1  . pantoprazole (PROTONIX) 40 MG tablet Take 1 tablet (40 mg total) by mouth daily. Take 30-60 minutes before breakfast. 90 tablet 1  . potassium chloride SA (KLOR-CON M20) 20 MEQ tablet Take 1 tablet (20 mEq total) by mouth  daily. 90 tablet 1  . rosuvastatin (CRESTOR) 20 MG tablet Take 20 mg by mouth daily.    Marland Kitchen spironolactone (ALDACTONE) 25 MG tablet Take 12.5 mg by mouth daily. Started at [facility] hospitalzation.      No current facility-administered medications on file prior to visit.     Review of Systems:  As per HPI- otherwise negative.  No fever or chills Physical Examination: Vitals:   01/11/19 1345  BP: 128/80  Pulse:  69  Resp: 17  Temp: 97.8 F (36.6 C)  SpO2: 99%   Vitals:   01/11/19 1345  Weight: 198 lb (89.8 kg)  Height: 5\' 5"  (1.651 m)   Body mass index is 32.95 kg/m. Ideal Body Weight: Weight in (lb) to have BMI = 25: 149.9  GEN: WDWN, NAD, Non-toxic, A & O x 3, obese, looks well HEENT: Atraumatic, Normocephalic. Neck supple. No masses, No LAD. Ears and Nose: No external deformity. CV: RRR, No M/G/R. No JVD. No thrill. No extra heart sounds. PULM: CTA B, no wheezes, crackles, rhonchi. No retractions. No resp. distress. No accessory muscle use. ABD: S, NT, ND, +BS. No rebound. No HSM.  Benign belly EXTR: No c/c/e NEURO Normal gait.  PSYCH: Normally interactive. Conversant. Not depressed or anxious appearing.  Calm demeanor.  She has a firm mass in the right inguinal crease, I believe this is a resolving hematoma from her recent heart cath.  There is mention of a hematoma on her discharge summary Advised her that this hematoma will likely gradually resolve over the next month or so There is some hyperpigmentation of the skin in the inguinal and groin area.  This appears benign Otherwise vulvar and vaginal exam is normal.  Cervix appears normal  Assessment and Plan: Vaginal odor - Plan: Cervicovaginal ancillary only( Elim), metroNIDAZOLE (METROGEL VAGINAL) 0.75 % vaginal gel  Pleural effusion - Plan: DG Chest 2 View  Hypokalemia - Plan: Comprehensive metabolic panel  Weight loss, unintentional  Here today for a follow-up visit.  Stephanie Parks has noticed some vaginal odor  for a couple of weeks Swab is pending, will begin treatment for presumed BV with metronidazole gel  She was noted to have hypokalemia at recent hospitalization, will check potassium today Stephanie Parks notes that she had "fluid in my lungs" on chest x-ray at [facility].  I reviewed report, she had bilateral trace pleural effusions.  We will repeat chest x-ray today to look for clearing  She also notes decreased appetite with an unintentional weight loss of about 15 pounds.  She is seeing her gastroenterologist next week, I will send a message about this concern   This visit occurred during the SARS-CoV-2 public health emergency.  Safety protocols were in place, including screening questions prior to the visit, additional usage of staff PPE, and extensive cleaning of exam room while observing appropriate contact time as indicated for disinfecting solutions.    Signed Lamar Blinks, MD  Received her labs and x-ray report-message to patient Results for orders placed or performed in visit on 01/11/19  Comprehensive metabolic panel  Result Value Ref Range   Sodium 142 135 - 145 mEq/L   Potassium 3.9 3.5 - 5.1 mEq/L   Chloride 99 96 - 112 mEq/L   CO2 34 (H) 19 - 32 mEq/L   Glucose, Bld 94 70 - 99 mg/dL   BUN 14 6 - 23 mg/dL   Creatinine, Ser 0.91 0.40 - 1.20 mg/dL   Total Bilirubin 0.4 0.2 - 1.2 mg/dL   Alkaline Phosphatase 66 39 - 117 U/L   AST 14 0 - 37 U/L   ALT 12 0 - 35 U/L   Total Protein 7.0 6.0 - 8.3 g/dL   Albumin 3.8 3.5 - 5.2 g/dL   GFR 73.27 >60.00 mL/min   Calcium 9.7 8.4 - 10.5 mg/dL   CLINICAL DATA:  73 year old female with pleural effusion follow-up.  EXAM: CHEST - 2 VIEW  COMPARISON:  Chest radiograph dated 10/17/2018.  FINDINGS: There  is no focal consolidation, pleural effusion, or pneumothorax. The cardiac silhouette is within normal limits. Degenerative changes of the spine. No acute osseous pathology. Lower cervical ACDF.  IMPRESSION: No active  cardiopulmonary disease.

## 2019-01-12 ENCOUNTER — Encounter: Payer: Self-pay | Admitting: Family Medicine

## 2019-01-12 LAB — CERVICOVAGINAL ANCILLARY ONLY
Bacterial Vaginitis (gardnerella): NEGATIVE
Candida Glabrata: NEGATIVE
Comment: NEGATIVE
Comment: NEGATIVE
Comment: NEGATIVE
Trichomonas: NEGATIVE

## 2019-01-19 ENCOUNTER — Ambulatory Visit: Payer: Medicare Other | Admitting: Internal Medicine

## 2019-01-19 ENCOUNTER — Other Ambulatory Visit: Payer: Self-pay | Admitting: *Deleted

## 2019-01-19 ENCOUNTER — Encounter: Payer: Self-pay | Admitting: Internal Medicine

## 2019-01-19 ENCOUNTER — Other Ambulatory Visit: Payer: Medicare Other

## 2019-01-19 VITALS — BP 124/70 | HR 76 | Temp 97.9°F | Ht 65.0 in | Wt 197.0 lb

## 2019-01-19 DIAGNOSIS — K5900 Constipation, unspecified: Secondary | ICD-10-CM | POA: Diagnosis not present

## 2019-01-19 DIAGNOSIS — R634 Abnormal weight loss: Secondary | ICD-10-CM

## 2019-01-19 DIAGNOSIS — K219 Gastro-esophageal reflux disease without esophagitis: Secondary | ICD-10-CM

## 2019-01-19 DIAGNOSIS — R0789 Other chest pain: Secondary | ICD-10-CM

## 2019-01-19 MED ORDER — SUCRALFATE 1 G PO TABS: 1.0000 g | ORAL_TABLET | Freq: Three times a day (TID) | ORAL | 1 refills | Status: DC

## 2019-01-19 NOTE — Patient Instructions (Addendum)
If you are age 73 or older, your body mass index should be between 23-30. Your Body mass index is 32.78 kg/m. If this is out of the aforementioned range listed, please consider follow up with your Primary Care Provider.  If you are age 38 or younger, your body mass index should be between 19-25. Your Body mass index is 32.78 kg/m. If this is out of the aformentioned range listed, please consider follow up with your Primary Care Provider.   Please go to the lab in the basement of our building to have lab work done as you leave today. Hit "B" for basement when you get on the elevator.  When the doors open the lab is on your left.  We will call you with the results. Thank you.   Discontinue your pantoprazole for 5 days.  Then submit your H. Pylori Stool sample to the lab.  Then, Restart your pantoprazole: TWICE a day.  We have sent the following medications to your pharmacy for you to pick up at your convenience: Carafate tablets:  Take three times a day before meals and at bedtime.  Slowly dissolve 1 tablet in 1 Tablespoon of distilled water before ingesting.  You can do a Miralax purge as follows:  Mix 8 capfuls of Miralax with 32 ounces of room temperature Gatorade.  Refrigerate.  Then drink an 8 ounce glass every 30 minutes (or over 2 to 3 hours) until gone.  You can do a half or a whole of this formula as needed to get results.    Then you can resume Senokot (2 tablets) at bedtime.  I would like to see you back in 6 weeks.  We have scheduled you for an office visit on Wednesday, 03-10-19 at 2:30pm. Please call [phone] if you need to reschedule.   Thank you for entrusting me with your care and for choosing [facility], Dr. Zenovia Jarred

## 2019-01-19 NOTE — Patient Outreach (Signed)
Unsuccessful outreach to Stephanie Parks this am. Left message to return my call. Note pt did see her primary care provider last week who did address the complaint she reported to me. Also additional issues that need follow up. She is to have a GI consult today.  Stephanie Parks. [name], MSN, GNP-BC Gerontological Nurse Practitioner [facility] Care Management [phone]  No return call from pt 01/19/19. Called again today, 01/20/19 and was also unsuccessful, left a message and requested a return call.

## 2019-01-19 NOTE — Progress Notes (Signed)
Subjective:    Patient ID: Stephanie Parks, female    DOB: 05/09/1945, 73 y.o.   MRN: [id number]  HPI Stephanie Parks is a 73 year old female with a history of chronic constipation, adenomatous colon polyps, history of colonic diverticulosis with diverticulitis in February 2020, GERD with hiatal hernia who is here for follow-up.  She was seen on 10/30/2018 by Nicoletta Ba, PA-C.  At her last visit she had had worsening constipation after stopping her Senokot.  Amy resume to have her Senokot tablets at bedtime.  She also gave a MiraLAX purge.  Continue her pantoprazole.  Today she reports that she has been struggling with epigastric and substernal chest pain.  This pain can radiate through to her back.  Worse when she eats and it feels like food sits in her lower chest and epigastrium.  Only able to really eat 1 meal per day which is usually breakfast.  Frequent belching and early fullness.  She has lost 17 pounds since September.  This is very worrisome to her.  Her appetite has been decreased.  She deals with what she feels is bad breath and a foul odor.  Her bowel movements normalized after the bowel purge and resuming Senokot though in the last several weeks due to decreased appetite she is having a more difficult time with harder stool and the need to strain at stool.  No rectal bleeding or melena.  She continues to have issues with lightheadedness.  She reports she has had MRI of her brain which I can see evidence of from August 2020.  She was admitted to [facility] earlier in November 2020 with chest pressure.  She was found to have an apparent rise in troponin and underwent left heart catheterization.  It was hypothesized that she had a non-ST elevation MI.  Left heart catheterization was negative for obstructive disease on 12/29/2018.  Normal EF and wall motion.  Review of Systems As per HPI, otherwise negative    Objective:   Physical Exam BP 124/70   Pulse 76    Temp 97.9 F (36.6 C)   Ht 5\' 5"  (1.651 m)   Wt 197 lb (89.4 kg)   BMI 32.78 kg/m  Gen: awake, alert, NAD HEENT: anicteric, op clear CV: RRR, no mrg Pulm: CTA b/l Abd: soft, NT/ND, +BS throughout Ext: no c/c/e Neuro: nonfocal3  CT ABDOMEN AND PELVIS WITH CONTRAST   TECHNIQUE: Multidetector CT imaging of the abdomen and pelvis was performed using the standard protocol following bolus administration of intravenous contrast.   CONTRAST:  134mL ISOVUE-300 IOPAMIDOL (ISOVUE-300) INJECTION 61%   COMPARISON:  CT Abdomen and Pelvis 12/03/2017.   FINDINGS: Lower chest: Incidental chronic post granulomatous changes in the right lower lung including small calcified right hilar lymph nodes. No acute findings. No pericardial or pleural effusion.   Hepatobiliary: Stable and negative liver, small low-density probable benign cyst in the left lobe. Negative gallbladder.   Pancreas: Negative.   Spleen: Negative.   Adrenals/Urinary Tract: Normal adrenal glands.   Bilateral renal enhancement and contrast excretion is symmetric and within normal limits. Normal proximal ureters. No definite nephrolithiasis. Diminutive and unremarkable urinary bladder.   Stomach/Bowel: Decompressed and negative rectum.   Mild to moderate diverticulosis of the sigmoid colon, with mild active inflammation suggested on coronal image 70. Mild mesenteric stranding there about a diverticulum. No extraluminal gas or fluid.   Oral contrast mixed with stool in the descending colon. Retained stool throughout redundant transverse colon. Mild  diverticulosis in the right colon. Normal appendix (series 2, image 57).   Mild diverticulosis of the terminal ileum which otherwise appears normal. No dilated small bowel. Small gastric hiatal hernia. Negative intraabdominal stomach. Negative duodenum.   No free air, free fluid.   Vascular/Lymphatic: Mild calcified atherosclerosis. Major arterial structures in the  abdomen and pelvis are patent. Portal venous system is patent.   Reproductive: Surgically absent uterus. Normal ovaries.   Other: No pelvic free fluid. Chronic fat containing left inguinal hernia is stable.   Musculoskeletal: Advanced chronic lower thoracic and lower lumbar disc degeneration. Chronic lumbar posterior element degeneration. No acute osseous abnormality identified.   IMPRESSION: 1. Very mild acute sigmoid diverticulitis suspected, with a small area of focal diverticular inflammation demonstrated on coronal image 70. No complicating features. 2. No other acute or inflammatory process in the abdomen or pelvis. Moderate volume of retained stool in the colon.     Electronically Signed   By: Genevie Ann M.D.   On: 03/24/2018 23:53     Assessment & Plan:  73 year old female with a history of chronic constipation, adenomatous colon polyps, history of colonic diverticulosis with diverticulitis in February 2020, GERD with hiatal hernia who is here for follow-up.   1.  GERD/epigastric and substernal chest pain/poor appetite and weight loss --she has had a cardiac evaluation very recently and did not have obstructive coronary disease.  She has been on daily PPI despite this is having poor appetite and reflux symptoms.  A relatively recent upper endoscopy was unremarkable having been performed on 02/19/2018.  I recommended the following --Hold PPI for 5 days and check an H. pylori stool antigen to ensure no H. pylori infection --Once stool submitted resume PPI but increase to 40 mg twice daily AC x1 month --When resuming PPI add Carafate slurry 1 g 3 times daily before meals and at bedtime --1 month follow-up, sooner if needed --If symptoms persist would consider abdominal ultrasound to evaluate for gallbladder pathology, if unrevealing consider repeat cross-sectional imaging versus repeat upper endoscopy  2.  Chronic constipation --bowel movements had improved with resumption of  Senokot but now she feels constipated again.  Linzess caused diarrhea. --MiraLAX purge --Resume Senokot 2 tablets nightly thereafter  25 minutes spent with the patient today. Greater than 50% was spent in counseling and coordination of care with the patient

## 2019-01-20 ENCOUNTER — Other Ambulatory Visit: Payer: Self-pay | Admitting: *Deleted

## 2019-01-20 NOTE — Patient Outreach (Signed)
Incoming call from Mrs. Coachman in response to my calls yesterday.  GYN: Mrs. Raspanti reports her gynecological complaint of vaginal odor has been resolved with the metrogel prescribed.  DIZZINESS: Continues to have dizziness. Wants this to be resolved. Tired of length of time she has had this complaint. Pt reports she has called [facility] to call her back regarding dizziness but she has not received a call back. We discussed trying meclizine, however, I am going to defer that possiblity to Dr. Edilia Bo.   HA: Did improve somewhat with temporal injections and steroid taper. Now reports pain level is tolerable at 3/10.  WEIGHT LOSS:  Current wt 197. Saw Dr. Hilarie Fredrickson yesterday. Pt to collect stool specimen after holding PPI for 5 days (H. Pylori) and return specimen to office. Resume PPI Pantoprazole 40 mg BID and start Carafate slurry tid before meals. Will consider abd ultrasound if no improvements with this regimen. Pt may also try Miralax purge for constipation and resume taking senekot 2 tabs hs.  HTN: Pt's blood pressure is controlled very well now. BP yesterday was 124/70 and has been near this measurement at home over the last week.  CV: Pt reports having some palpitations during the night, last night. Advised for her to try coughing a few times to see if this stops that. Also advised that she needs to make an appt with Dr. Irish Lack, since she has been put on spironolactone.  PLAN: Call [facility] neurology, Dr. Lawrence Marseilles to request pt call back.             In box to Dr. Edilia Bo regarding possibility of trial meclizine. (Note potential side effects are dizziness, hypotension and HA)  I will follow up in another week.  [name]. [name], MSN, [facility] Gerontological Nurse Practitioner [facility] Care Management [phone]

## 2019-01-22 ENCOUNTER — Encounter: Payer: Self-pay | Admitting: Family Medicine

## 2019-01-22 DIAGNOSIS — R002 Palpitations: Secondary | ICD-10-CM | POA: Diagnosis not present

## 2019-01-22 NOTE — Telephone Encounter (Signed)
-----   Message from Deloria Lair, NP sent at 01/20/2019  3:45 PM EST ----- Regarding: Request consideration meclizine RX Good afternoon, Dr. Edilia Bo!  I have talked with our pt today and she continues to c/o dizziness. I was wondering if a trial of meclizine would be worth a try. My reservation is that the side effect profile says that HA, dizziness and hypotension may occur. She is supposed to see neurology again next week. She is frustrated and impatient as you know. My note from today is in [place].  Please advise and give Rx if you think this is worth a try.  Hubert Azure [name], MSN, [facility] Gerontological Nurse Practitioner [facility] Care Management [phone]

## 2019-01-25 ENCOUNTER — Other Ambulatory Visit: Payer: Medicare Other

## 2019-01-25 DIAGNOSIS — K219 Gastro-esophageal reflux disease without esophagitis: Secondary | ICD-10-CM

## 2019-01-25 DIAGNOSIS — R0789 Other chest pain: Secondary | ICD-10-CM | POA: Diagnosis not present

## 2019-01-25 DIAGNOSIS — I441 Atrioventricular block, second degree: Secondary | ICD-10-CM | POA: Diagnosis not present

## 2019-01-25 DIAGNOSIS — K5900 Constipation, unspecified: Secondary | ICD-10-CM | POA: Diagnosis not present

## 2019-01-25 DIAGNOSIS — I471 Supraventricular tachycardia: Secondary | ICD-10-CM | POA: Diagnosis not present

## 2019-01-25 DIAGNOSIS — I493 Ventricular premature depolarization: Secondary | ICD-10-CM | POA: Diagnosis not present

## 2019-01-25 DIAGNOSIS — R634 Abnormal weight loss: Secondary | ICD-10-CM

## 2019-01-26 LAB — HELICOBACTER PYLORI  SPECIAL ANTIGEN
MICRO NUMBER:: 1170618
SPECIMEN QUALITY: ADEQUATE

## 2019-01-27 ENCOUNTER — Other Ambulatory Visit: Payer: Self-pay

## 2019-01-27 ENCOUNTER — Other Ambulatory Visit: Payer: Self-pay | Admitting: *Deleted

## 2019-01-27 MED ORDER — PANTOPRAZOLE SODIUM 40 MG PO TBEC: 40.0000 mg | DELAYED_RELEASE_TABLET | Freq: Two times a day (BID) | ORAL | 0 refills | Status: DC

## 2019-01-28 NOTE — Patient Outreach (Signed)
Telephone outreach.  Stephanie Parks states she is doing fairly well today! Her blood pressure is good 0000000 systolic. No head ache to speak of. Continues to have head pressure and dizziness or vertigo.  She is to see her neurologist next week.  We discussed one thing that has not been considered is positional hypotension with vertigo. Encouraged her to talk to her doctor about PT for this if she thinks this may be of benefit for her complaints that have not been resolved.  I will follow up with her next week.  [name]. [name], MSN, [facility] Gerontological Nurse Practitioner [facility] Care Management [phone]

## 2019-01-29 DIAGNOSIS — G4452 New daily persistent headache (NDPH): Secondary | ICD-10-CM | POA: Diagnosis not present

## 2019-01-29 DIAGNOSIS — M316 Other giant cell arteritis: Secondary | ICD-10-CM | POA: Diagnosis not present

## 2019-01-29 DIAGNOSIS — G518 Other disorders of facial nerve: Secondary | ICD-10-CM | POA: Diagnosis not present

## 2019-01-29 DIAGNOSIS — G4489 Other headache syndrome: Secondary | ICD-10-CM | POA: Diagnosis not present

## 2019-02-02 ENCOUNTER — Other Ambulatory Visit: Payer: Self-pay | Admitting: *Deleted

## 2019-02-02 NOTE — Patient Outreach (Signed)
Telephone assessment unsuccessful this am. Left a message and requested a return call.  Mrs. Stanislawski returned my call at 10:00am. She is very happy to report she is "[place] HERSELF!" She saw her neurologist on Friday and he started her on nortrytalline 25 mg titration schedule: 25 mg at hs for one week, then increase to bid. She says it is amazing how better she feels. She also states that she was on this medication at one time but it was discontinued.  Her Blood pressure has continued to be in the 110's/70's. She denies HA, pressure, dizziness.  We, again, discussed completing advanced directives. She has the forms.  We decided since she is feeling so much better, we will cut back our calls to once a month. Next call will be on Jan. 15th.  [name]. [name], MSN, [facility] Gerontological Nurse Practitioner [facility] Care Management [phone]

## 2019-02-19 ENCOUNTER — Other Ambulatory Visit: Payer: Self-pay | Admitting: Internal Medicine

## 2019-02-22 ENCOUNTER — Other Ambulatory Visit (HOSPITAL_COMMUNITY)
Admission: RE | Admit: 2019-02-22 | Discharge: 2019-02-22 | Disposition: A | Discharge status: Home / Self Care | Payer: Medicare Other | Source: Ambulatory Visit | Attending: Neurology | Admitting: Neurology

## 2019-02-22 ENCOUNTER — Other Ambulatory Visit: Payer: Self-pay | Admitting: Physician Assistant

## 2019-02-22 DIAGNOSIS — Z20822 Contact with and (suspected) exposure to covid-19: Secondary | ICD-10-CM | POA: Diagnosis not present

## 2019-02-22 DIAGNOSIS — Z01812 Encounter for preprocedural laboratory examination: Secondary | ICD-10-CM | POA: Insufficient documentation

## 2019-02-22 LAB — SARS CORONAVIRUS 2 (TAT 6-24 HRS): SARS Coronavirus 2: NEGATIVE

## 2019-02-24 ENCOUNTER — Other Ambulatory Visit: Payer: Self-pay

## 2019-02-24 ENCOUNTER — Ambulatory Visit (INDEPENDENT_AMBULATORY_CARE_PROVIDER_SITE_OTHER): Payer: Medicare Other | Admitting: Neurology

## 2019-02-24 DIAGNOSIS — G4733 Obstructive sleep apnea (adult) (pediatric): Secondary | ICD-10-CM

## 2019-02-24 DIAGNOSIS — E669 Obesity, unspecified: Secondary | ICD-10-CM

## 2019-02-24 DIAGNOSIS — R351 Nocturia: Secondary | ICD-10-CM

## 2019-02-24 DIAGNOSIS — G4720 Circadian rhythm sleep disorder, unspecified type: Secondary | ICD-10-CM

## 2019-02-24 DIAGNOSIS — R519 Headache, unspecified: Secondary | ICD-10-CM

## 2019-02-24 DIAGNOSIS — Z820 Family history of epilepsy and other diseases of the nervous system: Secondary | ICD-10-CM

## 2019-02-24 DIAGNOSIS — R058 Other specified cough: Secondary | ICD-10-CM

## 2019-02-24 DIAGNOSIS — R05 Cough: Secondary | ICD-10-CM

## 2019-02-25 ENCOUNTER — Ambulatory Visit: Payer: Medicare Other | Admitting: Family Medicine

## 2019-02-26 ENCOUNTER — Telehealth: Payer: Self-pay | Admitting: Internal Medicine

## 2019-02-26 NOTE — Telephone Encounter (Signed)
Pt states the carafate is causing her to be constipated with gas and bloating. She thinks the carafate is causing the symptoms.  Discussed with pt that she can try stopping the carafate to see if her symptoms stop and she will then know if the carafate has caused them. Pt has an upcoming OV and will try stopping the carafate.

## 2019-02-26 NOTE — Telephone Encounter (Signed)
Pt stated that sucralfate is causing constipation, flatulence and dizziness.  Please advise.

## 2019-03-03 ENCOUNTER — Telehealth: Payer: Self-pay

## 2019-03-03 NOTE — Addendum Note (Signed)
Addended by: Star Age on: 03/03/2019 08:32 AM   Modules accepted: Orders

## 2019-03-03 NOTE — Procedures (Signed)
PATIENT'S NAME:  Stephanie Parks, Stephanie Parks DOB:      1945/05/24      MR#:    [id number]     DATE OF RECORDING: 02/24/2019 REFERRING M.D.:  Amy Ubaldo Glassing, NP Study Performed:   CPAP  Titration HISTORY: 74 year old right-handed woman with an underlying medical history of hypertension, hyperlipidemia, reflux disease, diverticulosis, coronary artery disease, allergic rhinitis, migraine headaches, osteoarthritis, history of brain aneurysm and obesity, who presents for a full night titration study. Her baseline sleep study from 12/17/18 showed mild obstructive sleep apnea, moderate during supine sleep, with a total AHI of 10.1/hour, supine AHI of 21/hour, REM AHI of 8.6/hour, and O2 nadir of 78%. The patient endorsed the Epworth Sleepiness Scale at 7 points. The patient's weight 211 pounds with a height of 65 (inches), resulting in a BMI of 35.3 kg/m2. The patient's neck circumference measured 15.5 inches.  CURRENT MEDICATIONS: Tylenol 3, Fosamax, Aspirin, Calcium, Flonase, Cozaar, Multivitamins, Protonix, Senokot, Zoloft, Topamax, Imitrex  PROCEDURE:  This is a multichannel digital polysomnogram utilizing the SomnoStar 11.2 system.  Electrodes and sensors were applied and monitored per AASM Specifications.   EEG, EOG, Chin and Limb EMG, were sampled at 200 Hz.  ECG, Snore and Nasal Pressure, Thermal Airflow, Respiratory Effort, CPAP Flow and Pressure, Oximetry was sampled at 50 Hz. Digital video and audio were recorded.      The patient was fitted with a small Simplus FFM. CPAP was initiated at 5 cmH20 with heated humidity per AASM split night standards and pressure was advanced to 8 cm H20 because of hypopneas, apneas and desaturations.  At a PAP pressure of 7 cmH20, there was a reduction of the AHI to 0/hour with non-supine REM sleep achieved and O2 nadir of 87%.   Lights Out was at 22:15 and Lights On at 04:55. Total recording time (TRT) was 400.5 minutes, with a total sleep time (TST) of 313 minutes. The patient's  sleep latency was 29 minutes. REM latency was 96 minutes, which is normal. The sleep efficiency was 78.2%.    SLEEP ARCHITECTURE: WASO (Wake after sleep onset) was 40 minutes with mild to moderate sleep fragmentation noted. There were 16.5 minutes in Stage N1, 221.5 minutes Stage N2, 5.5 minutes Stage N3 and 69.5 minutes in Stage REM.  The percentage of Stage N1 was 5.3%, Stage N2 was 70.8%, which is markedly increased, Stage N3 was 1.8% and Stage R (REM sleep) was 22.2%, which is normal. The arousals were noted as: 17 were spontaneous, 0 were associated with PLMs, 2 were associated with respiratory events.  RESPIRATORY ANALYSIS:  There was a total of 11 respiratory events: 0 obstructive apneas, 0 central apneas and 0 mixed apneas with a total of 0 apneas and an apnea index (AHI) of 0 /hour. There were 11 hypopneas with a hypopnea index of 2.1/hour. The patient also had 0 respiratory event related arousals (RERAs).      The total APNEA/HYPOPNEA INDEX  (AHI) was 2.1 /hour and the total RESPIRATORY DISTURBANCE INDEX was 2.1 /hour  1 events occurred in REM sleep and 10 events in NREM. The REM AHI was .9 /hour versus a non-REM AHI of 2.5 /hour.  The patient spent 51.5 minutes of total sleep time in the supine position and 262 minutes in non-supine. The supine AHI was 3.5, versus a non-supine AHI of 1.8.  OXYGEN SATURATION & C02:  The baseline 02 saturation was 98%, with the lowest being 83%. Time spent below 89% saturation equaled 4 minutes.  PERIODIC LIMB MOVEMENTS:  The patient had a total of 0 Periodic Limb Movements. The Periodic Limb Movement (PLM) index was 0 and the PLM Arousal index was 0 /hour.  Audio and video analysis did not show any abnormal or unusual movements, behaviors, phonations or vocalizations. The patient took 1 bathroom break. The EKG was in keeping with normal sinus rhythm.  Post-study, the patient indicated that sleep was better than usual.   IMPRESSION: 1. Obstructive Sleep  Apnea (OSA) 2. Dysfunctions associated with sleep stages or arousal from sleep   RECOMMENDATIONS: 1. This study demonstrates resolution of the patient's obstructive sleep apnea with CPAP therapy. I will, therefore, start the patient on home CPAP treatment at a pressure of 7 cm via small FFM with heated humidity. The patient should be reminded to be fully compliant with PAP therapy to improve sleep related symptoms and decrease long term cardiovascular risks. The patient should be reminded, that it may take up to 3 months to get fully used to using PAP with all planned sleep. The earlier full compliance is achieved, the better long term compliance tends to be. Please note that untreated obstructive sleep apnea may carry additional perioperative morbidity. Patients with significant obstructive sleep apnea should receive perioperative PAP therapy and the surgeons and particularly the anesthesiologist should be informed of the diagnosis and the severity of the sleep disordered breathing. 2. This study shows sleep fragmentation and abnormal sleep stage percentages; these are nonspecific findings and per se do not signify an intrinsic sleep disorder or a cause for the patient's sleep-related symptoms. Causes include (but are not limited to) the first night effect of the sleep study, circadian rhythm disturbances, medication effect or an underlying mood disorder or medical problem.  3. The patient should be cautioned not to drive, work at heights, or operate dangerous or heavy equipment when tired or sleepy. Review and reiteration of good sleep hygiene measures should be pursued with any patient. 4. The patient will be seen in follow-up in the sleep clinic at [facility] for discussion of the test results, symptom and treatment compliance review, further management strategies, etc. The referring provider will be notified of the test results.   I certify that I have reviewed the entire raw data recording prior to the  issuance of this report in accordance with the Standards of Accreditation of the American Academy of Sleep Medicine (AASM)     Star Age, MD, PhD Diplomat, American Board of Neurology and Sleep Medicine (Neurology and Sleep Medicine)

## 2019-03-03 NOTE — Telephone Encounter (Signed)
LVM asking for pt to call back so we could review sleep study results. [facility] # provided for call back.

## 2019-03-03 NOTE — Telephone Encounter (Signed)
-----   Message from Star Age, MD sent at 03/03/2019  8:32 AM EST ----- Patient referred by Amy L, NP, seen by me on 11/24/18, diagnostic PSG on 12/17/18. Patient had a CPAP titration study on 02/24/19.  Please call and inform patient that I have entered an order for treatment with positive airway pressure (PAP) treatment for obstructive sleep apnea (OSA). She did well during the latest sleep study with CPAP. We will, therefore, arrange for a machine for home use through a DME (durable medical equipment) company of Her choice; and I will see the patient back in follow-up in about 10 weeks. Please also explain to the patient that I will be looking out for compliance data, which can be downloaded from the machine (stored on an SD card, that is inserted in the machine) or via remote access through a modem, that is built into the machine. At the time of the followup appointment we will discuss sleep study results and how it is going with PAP treatment at home. Please advise patient to bring Her machine at the time of the first FU visit, even though this is cumbersome. Bringing the machine for every visit after that will likely not be needed, but often helps for the first visit to troubleshoot if needed. Please re-enforce the importance of compliance with treatment and the need for [place] to monitor compliance data - often an insurance requirement and actually good feedback for the patient as far as how they are doing.  Also remind patient, that any interim PAP machine or mask issues should be first addressed with the DME company, as they can often help better with technical and mask fit issues. Please ask if patient has a preference regarding DME company.  Please also make sure, the patient has a follow-up appointment with me in about 10 weeks from the setup date, thanks. May see one of our nurse practitioners if needed for proper timing of the FU appointment.  Please fax or rout report to the referring provider. Thanks,    Star Age, MD, PhD [facility])

## 2019-03-03 NOTE — Telephone Encounter (Signed)
Pt returning call please call back °

## 2019-03-03 NOTE — Progress Notes (Signed)
Patient referred by Amy L, NP, seen by me on 11/24/18, diagnostic PSG on 12/17/18. Patient had a CPAP titration study on 02/24/19.  Please call and inform patient that I have entered an order for treatment with positive airway pressure (PAP) treatment for obstructive sleep apnea (OSA). She did well during the latest sleep study with CPAP. We will, therefore, arrange for a machine for home use through a DME (durable medical equipment) company of Her choice; and I will see the patient back in follow-up in about 10 weeks. Please also explain to the patient that I will be looking out for compliance data, which can be downloaded from the machine (stored on an SD card, that is inserted in the machine) or via remote access through a modem, that is built into the machine. At the time of the followup appointment we will discuss sleep study results and how it is going with PAP treatment at home. Please advise patient to bring Her machine at the time of the first FU visit, even though this is cumbersome. Bringing the machine for every visit after that will likely not be needed, but often helps for the first visit to troubleshoot if needed. Please re-enforce the importance of compliance with treatment and the need for [place] to monitor compliance data - often an insurance requirement and actually good feedback for the patient as far as how they are doing.  Also remind patient, that any interim PAP machine or mask issues should be first addressed with the DME company, as they can often help better with technical and mask fit issues. Please ask if patient has a preference regarding DME company.  Please also make sure, the patient has a follow-up appointment with me in about 10 weeks from the setup date, thanks. May see one of our nurse practitioners if needed for proper timing of the FU appointment.  Please fax or rout report to the referring provider. Thanks,   Star Age, MD, PhD [facility])

## 2019-03-04 NOTE — Telephone Encounter (Signed)
I reached out to the pt and advised of results. Pt verbalized understanding and was agreeable to CPAP recommendation. Pt chose [employer] as DME and her initial f/u as been scheduled for 05/18/2019 with MM, NP at 830 am.   Letter with information discussed will be mailed to the pt.

## 2019-03-05 ENCOUNTER — Other Ambulatory Visit: Payer: Self-pay | Admitting: *Deleted

## 2019-03-05 NOTE — Patient Outreach (Signed)
Telephone outreach to follow up on pt health concerns.  Unable to contact but left a message to return my call.  HTN:  Advanced Directives:    [name]. [name], MSN, [facility] Gerontological Nurse Practitioner [facility] Care Management [phone]

## 2019-03-08 ENCOUNTER — Other Ambulatory Visit: Payer: Self-pay | Admitting: *Deleted

## 2019-03-08 NOTE — Patient Outreach (Signed)
Second outreach to follow up on pt health issues: HTN, Advanced Directives.  [name]. [name], MSN, [facility] Gerontological Nurse Practitioner [facility] Care Management [phone]

## 2019-03-09 ENCOUNTER — Other Ambulatory Visit: Payer: Self-pay | Admitting: *Deleted

## 2019-03-09 NOTE — Patient Outreach (Signed)
Incoming return call from [name].  She reports she continues to be doing well although she does have some abdominal discomfort and is going to see her GI specialist tomorrow. She does admit to chronic consitpation.  Her blood pressure is running well always <130/80. Today it was 124/61.  She only had one HA episode this month and she self treated and had relief with her medication.  She is happy to report she is going back to work on Feb. 1st and she is looking forward to it!.  We agreed to transition her to disease management and talk every 3 months.  Current care plan goals have been met: [facility] CM Care Plan Problem One     Most Recent Value  Care Plan Problem One  Migraine Headaches  Role Documenting the Problem One  Care Management [place] for Problem One  Not Active  [facility] Long Term Goal   Pt will discover new methods to relieve or cope with her headaches over the next 90 days.  [facility]N Long Term Goal Start Date  11/25/18  [facility] Long Term Goal Met Date  01/20/19  [facility] CM Short Term Goal #1   Pt to find out from her carrier if she can have alternative medical tx: massage over the next 30 days..  [facility] CM Short Term Goal #1 Start Date  11/25/18    [facility] CM Care Plan Problem Two     Most Recent Value  Care Plan Problem Two  HTN  Role Documenting the Problem Two  Care Management Coordinator  Care Plan for Problem Two  Active  [facility]N Long Term Goal  Will not have to go to the ED with elevated blood pressure over the next 90 days.  THN Long Term Goal Start Date  11/25/18    [facility] CM Care Plan Problem Three     Most Recent Value  Care Plan Problem Three  Dizziness  Role Documenting the Problem Three  Care Management Coordinator  Care Plan for Problem Three  Active  [facility]N Long Term Goal   Over the next 60 days pt dizziness reports will be reduced.  [facility]N Long Term Goal Start Date  01/20/19  [facility]N Long Term Goal Met Date  02/02/19     GOAL for disease management: PATIENT WILL FOLLOW  CURRENT MEDICAL REGIMEN AND CALL MD OR NP WHEN ANY HEALTH PROBLEM ARISES TO RECEIVE INSTRUCTION AND AVOID HOSPITALIZATION.  I will call her again in April.  [name]. [name], MSN, [facility] Gerontological Nurse Practitioner [facility] Care Management [phone]

## 2019-03-10 ENCOUNTER — Encounter: Payer: Self-pay | Admitting: Internal Medicine

## 2019-03-10 ENCOUNTER — Ambulatory Visit: Payer: Medicare Other | Admitting: Internal Medicine

## 2019-03-10 VITALS — BP 120/72 | HR 60 | Temp 97.8°F | Ht 65.0 in | Wt 214.6 lb

## 2019-03-10 DIAGNOSIS — K5909 Other constipation: Secondary | ICD-10-CM | POA: Diagnosis not present

## 2019-03-10 DIAGNOSIS — R140 Abdominal distension (gaseous): Secondary | ICD-10-CM

## 2019-03-10 DIAGNOSIS — K219 Gastro-esophageal reflux disease without esophagitis: Secondary | ICD-10-CM

## 2019-03-10 MED ORDER — LUBIPROSTONE 8 MCG PO CAPS: 8.0000 ug | ORAL_CAPSULE | Freq: Two times a day (BID) | ORAL | 0 refills | Status: DC

## 2019-03-10 NOTE — Patient Instructions (Addendum)
Stop Carafate.   Start Amitiza 75mcg- twice daily.   Take Pantoprazole 80mg  in the morning.   If you are age 74 or older, your body mass index should be between 23-30. Your Body mass index is 35.71 kg/m. If this is out of the aforementioned range listed, please consider follow up with your Primary Care Provider.

## 2019-03-10 NOTE — Progress Notes (Signed)
   Subjective:    Patient ID: Stephanie Parks, female    DOB: 15-Feb-1946, 74 y.o.   MRN: [id number]  HPI Stephanie Parks is a 74 year old female with a history of chronic constipation, adenomatous colon polyps, history of colonic diverticulosis with diverticulitis in February 2020, GERD with hiatal hernia is here for follow-up.  I last saw her on 01/19/2019.  At her last visit she was having epigastric and substernal chest pain.  We increased her pantoprazole to what I thought was 40 mg twice daily but she is taking 80 mg in the morning.  Her pain has resolved and she is able to eat normally.  She has gained over 10 pounds since his last visit.  Her appetite has improved dramatically.  No dysphagia or odynophagia.  Her biggest complaint now is abdominal bloating and constipation.  She did try Carafate after her last visit but this was stopped as she felt it worsened her constipation.  She was previously taking senna 2 capsules at bedtime which was working but this is no longer effective.  She is having a bowel movement every 3 to 4 days.  She has tried enemas and mag citrate as well.  Constipation makes her feel uncomfortable and bloated.  We have had issues with having prescription laxatives be affordable.  She tried Linzess in the past but this was stopped due to diarrhea  She had upper endoscopy and colonoscopy in January 2020 and cross-sectional imaging of the abdomen pelvis in February 2020  Review of Systems As per HPI, otherwise negative  Current Medications, Allergies, Past Medical History, Past Surgical History, Family History and Social History were reviewed in [employer] record.     Objective:   Physical Exam BP 120/72   Pulse 60   Temp 97.8 F (36.6 C)   Ht 5\' 5"  (1.651 m)   Wt 214 lb 9.6 oz (97.3 kg)   BMI 35.71 kg/m  Gen: awake, alert, NAD HEENT: anicteric Abd: soft, NT/ND, +BS throughout Ext: no c/c/e Neuro: nonfocal      Assessment & Plan:    74 year old female with a history of chronic constipation, adenomatous colon polyps, history of colonic diverticulosis with diverticulitis in February 2020, GERD with hiatal hernia is here for follow-up.   1.  GERD/epigastric pain --resolved with higher dose PPI.  Weight loss has resolved and corrected.  Reassuring upper endoscopy 1 year ago --Continue pantoprazole 80 mg every morning  2.  Chronic constipation with bloating --biggest issue and not relieved by Senokot.  Linzess caused diarrhea.  We need to find a laxative that is not cost prohibitive --Try Amitiza 8 mcg twice daily with food, samples given today  30 minutes total spent today including patient facing time, coordination of care, reviewing medical history/procedures/pertinent radiology studies, and documentation of the encounter.

## 2019-03-12 ENCOUNTER — Other Ambulatory Visit: Payer: Self-pay | Admitting: Internal Medicine

## 2019-03-15 ENCOUNTER — Other Ambulatory Visit: Payer: Self-pay

## 2019-03-15 ENCOUNTER — Ambulatory Visit: Payer: Medicare Other | Admitting: Pediatrics

## 2019-03-15 ENCOUNTER — Encounter: Payer: Self-pay | Admitting: Pediatrics

## 2019-03-15 VITALS — BP 116/66 | HR 94 | Temp 98.5°F | Resp 16 | Ht 65.0 in | Wt 213.0 lb

## 2019-03-15 DIAGNOSIS — H6981 Other specified disorders of Eustachian tube, right ear: Secondary | ICD-10-CM | POA: Diagnosis not present

## 2019-03-15 DIAGNOSIS — Z79899 Other long term (current) drug therapy: Secondary | ICD-10-CM | POA: Diagnosis not present

## 2019-03-15 DIAGNOSIS — R42 Dizziness and giddiness: Secondary | ICD-10-CM

## 2019-03-15 DIAGNOSIS — K219 Gastro-esophageal reflux disease without esophagitis: Secondary | ICD-10-CM

## 2019-03-15 DIAGNOSIS — I10 Essential (primary) hypertension: Secondary | ICD-10-CM | POA: Diagnosis not present

## 2019-03-15 NOTE — Progress Notes (Signed)
[address] [place] [address] Dept: [phone]  FOLLOW UP NOTE  Patient ID: Stephanie Parks, female    DOB: 08-28-1945  Age: 74 y.o. MRN: [id number] Date of Office Visit: 03/15/2019  Assessment  Chief Complaint: Cough  HPI Stephanie Parks presents for evaluation of fullness in the right ear for about 2 or 3 weeks with some pressure but no actual pain.  She has had a mild cough.  She has not had a fever or postnasal drainage.  She is on allergy injections every 4 weeks and has had an excellent response to the allergy injections.  She is not using any antihistamines or nasal sprays.  She has had some dizziness for several months and is scheduled to see her neurologist in 3 days.  She is being followed by cardiology for a  history of palpitations and her heart has been much improved. Other current medications are outlined in the chart   Drug Allergies:  Allergies  Allergen Reactions  . Shellfish Allergy Hives and Swelling  . Ace Inhibitors Other (See Comments)    Pt cannot recall her reaction but tolerates arb   . Pitavastatin   . Topiramate     Heart race,   . Sulfa Antibiotics Other (See Comments) and Rash    Fine bumps    Physical Exam: BP 116/66   Pulse 94   Temp 98.5 F (36.9 C) (Oral)   Resp 16   Ht 5\' 5"  (1.651 m)   Wt 213 lb (96.6 kg)   SpO2 98%   BMI 35.45 kg/m    Physical Exam Constitutional:      Appearance: Normal appearance. She is obese.  HENT:     Head:     Comments: Eyes normal.  Ears showed a retracted right eardrum.Marland Kitchen  Nose normal.  Pharynx normal. Cardiovascular:     Rate and Rhythm: Normal rate and regular rhythm.     Comments: S1-S2 normal no murmurs Pulmonary:     Comments: Clear to percussion and auscultation Musculoskeletal:     Cervical back: Neck supple.  Lymphadenopathy:     Cervical: No cervical adenopathy.  Neurological:     General: No focal deficit present.     Mental Status: She is alert and oriented to person,  place, and time. Mental status is at baseline.  Psychiatric:        Mood and Affect: Mood normal.        Behavior: Behavior normal.        Thought Content: Thought content normal.        Judgment: Judgment normal.     Diagnostics:    Assessment and Plan: 1. Dysfunction of right eustachian tube   2. Dizziness   3. Essential hypertension   4. Current use of beta blocker   5. Gastroesophageal reflux disease without esophagitis     No orders of the defined types were placed in this encounter.   Patient Instructions  Claritin 10 mg-take 1 tablet once a day for runny nose Fluticasone 1 spray per nostril twice a day for stuffy nose or drainage Prednisone 10 mg tablet-take 1 tablet twice a day for 4 days, 1 tablet on the fifth day to try to open up your Eustachian tubes Continue on allergy injections every 4 weeks  Continue on your other medications Call us if you are not doing well on this treatment plan   Return in about 1 year (around 03/14/2020).    Thank you for the opportunity  to care for this patient.  Please do not hesitate to contact me with questions.  Penne Lash, M.D.  [facility] [address] [place], [place] [address] [phone]

## 2019-03-15 NOTE — Patient Instructions (Signed)
Claritin 10 mg-take 1 tablet once a day for runny nose Fluticasone 1 spray per nostril twice a day for stuffy nose or drainage Prednisone 10 mg tablet-take 1 tablet twice a day for 4 days, 1 tablet on the fifth day to try to open up your Eustachian tubes Continue on allergy injections every 4 weeks  Continue on your other medications Call us if you are not doing well on this treatment plan

## 2019-03-17 DIAGNOSIS — G4452 New daily persistent headache (NDPH): Secondary | ICD-10-CM | POA: Diagnosis not present

## 2019-03-17 DIAGNOSIS — M316 Other giant cell arteritis: Secondary | ICD-10-CM | POA: Diagnosis not present

## 2019-03-17 DIAGNOSIS — G518 Other disorders of facial nerve: Secondary | ICD-10-CM | POA: Diagnosis not present

## 2019-03-17 DIAGNOSIS — G4489 Other headache syndrome: Secondary | ICD-10-CM | POA: Diagnosis not present

## 2019-03-22 DIAGNOSIS — G4733 Obstructive sleep apnea (adult) (pediatric): Secondary | ICD-10-CM | POA: Diagnosis not present

## 2019-03-31 DIAGNOSIS — I493 Ventricular premature depolarization: Secondary | ICD-10-CM | POA: Diagnosis not present

## 2019-04-19 DIAGNOSIS — G4733 Obstructive sleep apnea (adult) (pediatric): Secondary | ICD-10-CM | POA: Diagnosis not present

## 2019-04-26 ENCOUNTER — Telehealth: Payer: Self-pay | Admitting: Internal Medicine

## 2019-04-26 NOTE — Telephone Encounter (Signed)
Pt states she is still having issues with constipation. Reports she takes MOM and it does seem to help. States she is having a lot of bloating still in the upper and lower part of her stomach. She states Dr. Hilarie Fredrickson had mentioned possibly doing an xray if she continued to have issues. Please advise.

## 2019-04-27 ENCOUNTER — Other Ambulatory Visit: Payer: Self-pay

## 2019-04-27 DIAGNOSIS — R109 Unspecified abdominal pain: Secondary | ICD-10-CM

## 2019-04-27 DIAGNOSIS — R140 Abdominal distension (gaseous): Secondary | ICD-10-CM

## 2019-04-27 NOTE — Telephone Encounter (Signed)
Left message for pt to call back. Order in epic for xray.

## 2019-04-27 NOTE — Telephone Encounter (Signed)
Would have her come for a 2 view abdominal film We tried Amitiza, was this covered or not by her insurance?  Was it cost prohibitive? Previously Linzess caused diarrhea It is okay for her to continue milk of magnesia but she continues to have issues with bloating which is most likely related to her constipation. Would insurance to pay for Trulance or Motegrity?

## 2019-04-28 NOTE — Telephone Encounter (Signed)
Pt returned your call.  

## 2019-04-29 MED ORDER — NALOXEGOL OXALATE 12.5 MG PO TABS: 12.5000 mg | ORAL_TABLET | Freq: Every day | ORAL | 3 refills | Status: DC

## 2019-04-29 NOTE — Telephone Encounter (Signed)
Spoke with pt and she is aware and knows to come for xray. Ins would not cover amitiza. Pt wanted to see if movantk would be covered. Script sent to pharmacy.

## 2019-05-05 ENCOUNTER — Ambulatory Visit (INDEPENDENT_AMBULATORY_CARE_PROVIDER_SITE_OTHER)
Admission: RE | Admit: 2019-05-05 | Discharge: 2019-05-05 | Disposition: A | Discharge status: Home / Self Care | Payer: Medicare Other | Source: Ambulatory Visit | Attending: Internal Medicine | Admitting: Internal Medicine

## 2019-05-05 ENCOUNTER — Other Ambulatory Visit: Payer: Self-pay

## 2019-05-05 DIAGNOSIS — R109 Unspecified abdominal pain: Secondary | ICD-10-CM | POA: Diagnosis not present

## 2019-05-05 DIAGNOSIS — R140 Abdominal distension (gaseous): Secondary | ICD-10-CM | POA: Diagnosis not present

## 2019-05-05 DIAGNOSIS — K5900 Constipation, unspecified: Secondary | ICD-10-CM | POA: Diagnosis not present

## 2019-05-07 ENCOUNTER — Telehealth: Payer: Self-pay | Admitting: Internal Medicine

## 2019-05-07 NOTE — Telephone Encounter (Signed)
Patient is returning your call.  

## 2019-05-07 NOTE — Telephone Encounter (Signed)
See result note.  

## 2019-05-19 ENCOUNTER — Ambulatory Visit: Payer: Medicare Other | Admitting: Adult Health

## 2019-05-19 ENCOUNTER — Encounter: Payer: Self-pay | Admitting: Adult Health

## 2019-05-19 ENCOUNTER — Other Ambulatory Visit: Payer: Self-pay

## 2019-05-19 VITALS — BP 120/60 | HR 76 | Temp 97.5°F | Wt 216.8 lb

## 2019-05-19 DIAGNOSIS — G4733 Obstructive sleep apnea (adult) (pediatric): Secondary | ICD-10-CM

## 2019-05-19 DIAGNOSIS — Z9989 Dependence on other enabling machines and devices: Secondary | ICD-10-CM | POA: Diagnosis not present

## 2019-05-19 NOTE — Progress Notes (Addendum)
PATIENT: Stephanie Parks DOB: [birth year]  REASON FOR VISIT: follow up HISTORY FROM: patient  HISTORY OF PRESENT ILLNESS: Today 05/19/19:  Stephanie Parks is a 74 year old female with a history of obstructive sleep apnea on CPAP.  Her download indicates that she used her machine 25 out of 30 days for compliance of 83%.  She used her machine greater than 4 hours 24 days for compliance of 80%.  On average she uses her machine 6 hours and 34 minutes.  Residual AHI is 2.8 on 7 cm of water with EPR 3.  Leak in the 95th percentile is 26.1 L/min.She reports that she does see the benefit.  Reports that she is no longer having heart palpitations at night.  HISTORY Stephanie Parks is a 77 year old right-handed woman with an underlying medical history of hypertension, hyperlipidemia, reflux disease, diverticulosis, coronary artery disease, allergic rhinitis, migraine headaches, osteoarthritis, history of brain aneurysm and obesity, who reports snoring and excessive daytime somnolence, as well as a family history of sleep apnea and waking up with headaches at times.  I reviewed your office note from 11/16/2018.  She does not sleep very well.  She has a variable sleep schedule because she works second shift and sometimes first shift.  She works as a Scientist, water quality.  She is divorced and lives alone.  She quit smoking in 1986 and does not currently drink any caffeine on a daily basis and no alcohol.  She snores and used to grind her teeth.  She has a TV in the bedroom but turns it off at night.  She has no pets.  She has to go to the bathroom on average twice per night.  She has occasionally woken up with a headache.  She estimates that she sleeps on average 4 to 5 hours on any given night.  Her sister has sleep apnea.  She would be willing to consider CPAP therapy if she has sleep apnea. Lately, she has been sleeping in a recliner rather than a bed because of nighttime cough.  She has a history of reflux disease as well and takes  pantoprazole.  REVIEW OF SYSTEMS: Out of a complete 14 system review of symptoms, the patient complains only of the following symptoms, and all other reviewed systems are negative.   ESS 11  ALLERGIES: Allergies  Allergen Reactions  . Shellfish Allergy Hives and Swelling  . Ace Inhibitors Other (See Comments)    Pt cannot recall her reaction but tolerates arb   . Pitavastatin   . Topiramate     Heart race,   . Sulfa Antibiotics Other (See Comments) and Rash    Fine bumps    HOME MEDICATIONS: Outpatient Medications Prior to Visit  Medication Sig Dispense Refill  . aspirin 81 MG tablet Take 81 mg by mouth daily.    . Azelastine HCl 0.15 % SOLN Place 1-2 sprays into both nostrils 2 (two) times daily. 30 mL 5  . Calcium Carbonate (CALCIUM 600 PO) Take 1 tablet by mouth daily.    . fluticasone (FLONASE) 50 MCG/ACT nasal spray USE 2 SPRAYS IN NOSTRIL(S) DAILY AS NEEDED. 16 g 9  . losartan-hydrochlorothiazide (HYZAAR) 50-12.5 MG tablet Take 1 tablet by mouth 2 (two) times daily.    . metoprolol succinate (TOPROL-XL) 25 MG 24 hr tablet Take by mouth.    . Multiple Vitamins-Minerals (MULTIVITAL) tablet Take by mouth.    . naloxegol oxalate (MOVANTIK) 12.5 MG TABS tablet Take 1 tablet (12.5 mg total) by mouth daily.  30 tablet 3  . NONFORMULARY OR COMPOUNDED ITEM Allergen immunotherapy 2 each 1  . nortriptyline (PAMELOR) 25 MG capsule Take 25 mg by mouth daily.    . pantoprazole (PROTONIX) 40 MG tablet Take 1 tablet (40 mg total) by mouth daily. 30 tablet 0  . potassium chloride SA (KLOR-CON M20) 20 MEQ tablet Take 1 tablet (20 mEq total) by mouth daily. 90 tablet 1  . rosuvastatin (CRESTOR) 20 MG tablet Take 20 mg by mouth daily.    Marland Kitchen spironolactone (ALDACTONE) 25 MG tablet Take 12.5 mg by mouth daily. Started at [facility] hospital[facility].      No facility-administered medications prior to visit.    PAST MEDICAL HISTORY: Past Medical History:  Diagnosis Date  . 3-vessel CAD 03/01/2015  .  Achalasia 09/28/2012  . Allergic rhinitis 03/01/2015  . Anemia   . Aneurysm ([place]) 03/01/2015  . BP (high blood pressure) 03/01/2015  . Bronchitis 04/23/2018  . Cephalalgia 08/24/2012   Overview:  ICD-10 cut over    . Chest pain 04/04/2011  . CN (constipation) 09/28/2012  . Coughing 04/24/2017  . Decreased potassium in the blood 03/01/2015  . Diverticulosis   . Dizziness 03/01/2015  . GERD (gastroesophageal reflux disease)   . Hiatal hernia   . Hypercholesterolemia 03/01/2015  . Hyperlipidemia   . Hypertension   . Ingrown toenail 08/10/2017  . Inguinal hernia   . Migraine headache   . Onychomycosis 12/08/2017  . Paresthesia of arm 03/01/2015  . Schatzki's ring   . Tendonitis, Achilles, right 12/08/2017  . Unilateral primary osteoarthritis, left knee 10/07/2017    PAST SURGICAL HISTORY: Past Surgical History:  Procedure Laterality Date  . ABDOMINAL HYSTERECTOMY    . BACK SURGERY     x 3  . CARPAL TUNNEL RELEASE     left wrist  . CERVICAL SPINE SURGERY    . HEMORRHOID SURGERY      FAMILY HISTORY: Family History  Problem Relation Age of Onset  . Allergic rhinitis Sister   . Diabetes Sister   . Hypertension Sister   . Heart attack Father 38  . Heart disease Father   . Stomach cancer Maternal Grandmother   . Heart disease Mother   . Colon cancer Neg Hx   . Angioedema Neg Hx   . Asthma Neg Hx   . Eczema Neg Hx   . Urticaria Neg Hx   . Immunodeficiency Neg Hx   . Breast cancer Neg Hx   . Migraines Neg Hx   . Headache Neg Hx     SOCIAL HISTORY: Social History   Socioeconomic History  . Marital status: Divorced    Spouse name: Not on file  . Number of children: 2  . Years of education: Not on file  . Highest education level: Some college, no degree  Occupational History  . 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 Smoking status: Former Smoker    Packs/day: 0.50    Years: 20.00    Pack years: 10.00    Types: Cigarettes    Quit date: 02/19/1984     Years since quitting: 35.2  . Smokeless tobacco: Never Used  Substance and Sexual Activity  . Alcohol use: No  . Drug use: No  . Sexual activity: Yes    Partners: Male  Other Topics Concern  . Not on file  Social History Narrative   Lives alone   Caffeine use: Coffee one cup daily   Right handed  Son is her next of kin   Working at [employer] Strain: [place]   . Difficulty of Paying Living Expenses: Not hard at all  Food Insecurity: No Food Insecurity  . Worried About Charity fundraiser in the Last Year: Never true  . Ran Out of Food in the Last Year: Never true  Transportation Needs: No Transportation Needs  . Lack of Transportation (Medical): No  . Lack of Transportation (Non-Medical): No  Physical Activity: Inactive  . Days of Exercise per Week: 0 days  . Minutes of Exercise per Session: 0 min  Stress: Stress Concern Present  . Feeling of Stress : To some extent  Social Connections: Slightly Isolated  . Frequency of Communication with Friends and Family: More than three times a week  . Frequency of Social Gatherings with Friends and Family: More than three times a week  . Attends Religious Services: 1 to 4 times per year  . Active Member of Clubs or Organizations: Yes  . Attends Archivist Meetings: 1 to 4 times per year  . Marital Status: Divorced  Human resources officer Violence: Not At Risk  . Fear of Current or Ex-Partner: No  . Emotionally Abused: No  . Physically Abused: No  . Sexually Abused: No      PHYSICAL EXAM  There were no vitals filed for this visit. There is no height or weight on file to calculate BMI.  Generalized: Well developed, in no acute distress  Chest: Lungs clear to auscultation bilaterally  Neurological examination  Mentation: Alert oriented to time, place, history taking. Follows all commands speech and language fluent Cranial nerve II-XII: Extraocular movements were full,  visual field were full on confrontational test Head turning and shoulder shrug  were normal and symmetric. Motor: The motor testing reveals 5 over 5 strength of all 4 extremities. Good symmetric motor tone is noted throughout.  Sensory: Sensory testing is intact to soft touch on all 4 extremities. No evidence of extinction is noted.  Gait and station: Gait is normal.    DIAGNOSTIC DATA (LABS, IMAGING, TESTING) - I reviewed patient records, labs, notes, testing and imaging myself where available.  Lab Results  Component Value Date   WBC 8.3 12/22/2018   HGB 12.7 12/22/2018   HCT 40.6 12/22/2018   MCV 91.0 12/22/2018   PLT 358 12/22/2018      Component Value Date/Time   NA 142 01/11/2019 1418   NA 144 07/28/2018 0756   K 3.9 01/11/2019 1418   CL 99 01/11/2019 1418   CO2 34 (H) 01/11/2019 1418   GLUCOSE 94 01/11/2019 1418   BUN 14 01/11/2019 1418   BUN 11 07/28/2018 0756   CREATININE 0.91 01/11/2019 1418   CALCIUM 9.7 01/11/2019 1418   PROT 7.0 01/11/2019 1418   PROT 6.2 07/28/2018 0756   ALBUMIN 3.8 01/11/2019 1418   ALBUMIN 4.0 07/28/2018 0756   AST 14 01/11/2019 1418   ALT 12 01/11/2019 1418   ALKPHOS 66 01/11/2019 1418   BILITOT 0.4 01/11/2019 1418   BILITOT 0.4 07/28/2018 0756   GFRNONAA 55 (L) 12/22/2018 1752   GFRAA >60 12/22/2018 1752   Lab Results  Component Value Date   CHOL 176 07/28/2018   HDL 73 07/28/2018   LDLCALC 93 07/28/2018   TRIG 51 07/28/2018   CHOLHDL 2.4 07/28/2018   Lab Results  Component Value Date   HGBA1C 5.9 10/21/2018   Lab Results  Component Value Date   VITAMINB12 403 07/02/2018   Lab Results  Component Value Date   TSH 1.47 10/21/2018      ASSESSMENT AND PLAN 74 y.o. year old female  has a past medical history of 3-vessel CAD (03/01/2015), Achalasia (09/28/2012), Allergic rhinitis (03/01/2015), Anemia, Aneurysm ([place]) (03/01/2015), BP (high blood pressure) (03/01/2015), Bronchitis (04/23/2018), Cephalalgia (08/24/2012), Chest pain  (04/04/2011), CN (constipation) (09/28/2012), Coughing (04/24/2017), Decreased potassium in the blood (03/01/2015), Diverticulosis, Dizziness (03/01/2015), GERD (gastroesophageal reflux disease), Hiatal hernia, Hypercholesterolemia (03/01/2015), Hyperlipidemia, Hypertension, Ingrown toenail (08/10/2017), Inguinal hernia, Migraine headache, Onychomycosis (12/08/2017), Paresthesia of arm (03/01/2015), Schatzki's ring, Tendonitis, Achilles, right (12/08/2017), and Unilateral primary osteoarthritis, left knee (10/07/2017). here with:  1. OSA on CPAP  - CPAP compliance excellent - Good treatment of AHI  - Encourage patient to use CPAP nightly and > 4 hours each night - F/U in 1 year or sooner if needed   I spent 20 minutes of face-to-face and non-face-to-face time with patient.  This included previsit chart review, lab review, study review, order entry, electronic health record documentation, patient education.  Ward Givens, MSN, NP-C 05/19/2019, 8:23 AM [facility] [address] [place], [place] [address] [phone]  I reviewed the above note and documentation by the Nurse Practitioner and agree with the history, exam, assessment and plan as outlined above. I was available for consultation. Star Age, MD, PhD [facility] [place] [facility])

## 2019-05-19 NOTE — Patient Instructions (Signed)
Continue using CPAP nightly and greater than 4 hours each night °If your symptoms worsen or you develop new symptoms please let us know.  ° °

## 2019-05-20 DIAGNOSIS — G4733 Obstructive sleep apnea (adult) (pediatric): Secondary | ICD-10-CM | POA: Diagnosis not present

## 2019-05-21 ENCOUNTER — Other Ambulatory Visit: Payer: Self-pay | Admitting: Family Medicine

## 2019-05-21 DIAGNOSIS — Z5181 Encounter for therapeutic drug level monitoring: Secondary | ICD-10-CM

## 2019-05-24 ENCOUNTER — Other Ambulatory Visit: Payer: Self-pay | Admitting: Internal Medicine

## 2019-05-24 ENCOUNTER — Telehealth: Payer: Self-pay | Admitting: Pediatrics

## 2019-05-24 ENCOUNTER — Telehealth: Payer: Self-pay | Admitting: Adult Health

## 2019-05-24 MED ORDER — DOXYCYCLINE HYCLATE 100 MG PO TABS: 100.0000 mg | ORAL_TABLET | Freq: Two times a day (BID) | ORAL | 0 refills | Status: AC

## 2019-05-24 NOTE — Telephone Encounter (Signed)
Pts says that dr b stated her ear was closed, she has done the prednisone and still having light headiness coming and going along with the hearing coming and going also. She would like to know what else to try.

## 2019-05-24 NOTE — Telephone Encounter (Signed)
Patient states she is still having issues with her right easr and lightheadness

## 2019-05-24 NOTE — Telephone Encounter (Signed)
LMVm for pt that returned call.

## 2019-05-24 NOTE — Telephone Encounter (Signed)
Pt informed that antibiotic was sent to [facility].

## 2019-05-24 NOTE — Telephone Encounter (Signed)
Pt is asking to be changed over to another DME. Pt states she has had several issues with [facility] especially as it relates to her getting CPAP supplies.  Please call

## 2019-05-24 NOTE — Telephone Encounter (Signed)
If she is not allergic, let's try doxycycline 100 mg twice a day for seven days.  Salvatore Marvel, MD

## 2019-05-25 NOTE — Telephone Encounter (Signed)
*  Pt claims we are denying her supplies. States she did not receive the extra cushions given to her at time of setup, so she wants new ones. Pt did sign a delivery ticket showing she received the cushions. Pt is not eligible for cushions until 06/19/19. Pt states she is "sick of [place]" and that we are denying her care. I have a demo cushion to send the pt, but I haven't had a chance to call her back yet. This demo cushion will go out tomorrow. If you have any questions please call me anytime.    Darnelle Bos  Manager/Sales [phone] office [phone] fax [phone] cell

## 2019-05-25 NOTE — Telephone Encounter (Signed)
Can we reach out to [facility] Jeneen Rinks)- I am not sure she can change DME if she just got a new machine from [facility]??

## 2019-05-25 NOTE — Telephone Encounter (Signed)
Pt has called Sandy,RN back to report that the CPAP she wants is [employer] Air Sense # 10.  If RN needs to call back pt can be reached at [phone] between her lunch of 1:30-2:00

## 2019-05-25 NOTE — Telephone Encounter (Signed)
I called pt and she is wanting to change DME co but also wants to change to the machine she listed.  She states she has had an awful time with [facility] (they said they gave her supplies) she says they did not.  Please advise. New machine 04-11-19 from [facility].  She has not been in touch with them as yet.

## 2019-05-26 NOTE — Telephone Encounter (Signed)
noted 

## 2019-05-26 NOTE — Telephone Encounter (Signed)
LMVM for pt to return call if needed.  I did LM with the information about demo cushion from [employer], and Jeneen Rinks to speak to her.

## 2019-05-31 ENCOUNTER — Other Ambulatory Visit: Payer: Self-pay

## 2019-06-02 ENCOUNTER — Ambulatory Visit (INDEPENDENT_AMBULATORY_CARE_PROVIDER_SITE_OTHER): Payer: Medicare Other | Admitting: Family Medicine

## 2019-06-02 ENCOUNTER — Other Ambulatory Visit: Payer: Self-pay

## 2019-06-02 ENCOUNTER — Encounter: Payer: Self-pay | Admitting: Family Medicine

## 2019-06-02 VITALS — BP 133/74 | HR 77 | Temp 97.7°F | Resp 17 | Ht 65.0 in | Wt 215.0 lb

## 2019-06-02 DIAGNOSIS — E785 Hyperlipidemia, unspecified: Secondary | ICD-10-CM

## 2019-06-02 DIAGNOSIS — R7303 Prediabetes: Secondary | ICD-10-CM | POA: Diagnosis not present

## 2019-06-02 DIAGNOSIS — M5432 Sciatica, left side: Secondary | ICD-10-CM | POA: Diagnosis not present

## 2019-06-02 DIAGNOSIS — E876 Hypokalemia: Secondary | ICD-10-CM

## 2019-06-02 DIAGNOSIS — I10 Essential (primary) hypertension: Secondary | ICD-10-CM

## 2019-06-02 MED ORDER — PREDNISONE 20 MG PO TABS: ORAL_TABLET | ORAL | 0 refills | Status: DC

## 2019-06-02 MED ORDER — METHOCARBAMOL 500 MG PO TABS: 500.0000 mg | ORAL_TABLET | Freq: Three times a day (TID) | ORAL | 0 refills | Status: DC | PRN

## 2019-06-02 NOTE — Progress Notes (Addendum)
[facility] [place], [place], [place] [address] [phone]  Date:  06/02/2019   Name:  Stephanie Parks   DOB:  [birth year]   MRN:  [id number]  PCP:  [name], MD    Chief Complaint: Leg Pain (left leg pain, couple of week, no injury)   History of Present Illness:  Stephanie Parks is a 74 y.o. very pleasant female patient who presents with the following: Last seen by myself in November 2020  Patient with history of hypertension, CAD, brain aneurysm, GERD, sleep apnea on CPAP  Here today for follow-up and concern of leg pain- LEFT side- present for 2-3 weeks Tylenol helps some but does not completely relieve her symptoms No injury that she can recall It seems to run down the leg and into the calf Shaking her leg may help-shooting nerve type pain  She is not having back pain No leg numbness or weakness  She saw neurology to follow-up on sleep apnea at the end of March-good compliance with CPAP machine, plan follow-up in 1 year  Tetanus vaccine- she is not sure, declines today Pneumonia vaccine- she declines today- "I have a fear of shots" Can update routine blood work today if she would like Patient Active Problem List   Diagnosis Date Noted  . Seasonal and perennial allergic rhinitis 12/22/2018  . Heart palpitations 12/22/2018  . History of chest pain 07/20/2018  . Brain aneurysm 07/20/2018  . Bronchitis 04/23/2018  . Tendonitis, Achilles, right 12/08/2017  . Onychomycosis 12/08/2017  . Unilateral primary osteoarthritis, left knee 10/07/2017  . Ingrown toenail 08/10/2017  . Coughing 04/24/2017  . CAD (coronary artery disease) 03/01/2015  . Aneurysm ([place]) 03/01/2015  . Dizziness 03/01/2015  . Paresthesia of arm 03/01/2015  . Allergic rhinitis 03/01/2015  . Achalasia 09/28/2012  . CN (constipation) 09/28/2012  . Cephalalgia 08/24/2012  . HTN (hypertension) 04/19/2011  . Hyperlipidemia  04/19/2011  . GERD (gastroesophageal reflux disease) 04/19/2011  . Chest pain 04/04/2011    Past Medical History:  Diagnosis Date  . 3-vessel CAD 03/01/2015  . Achalasia 09/28/2012  . Allergic rhinitis 03/01/2015  . Anemia   . Aneurysm ([place]) 03/01/2015  . BP (high blood pressure) 03/01/2015  . Bronchitis 04/23/2018  . Cephalalgia 08/24/2012   Overview:  ICD-10 cut over    . Chest pain 04/04/2011  . CN (constipation) 09/28/2012  . Coughing 04/24/2017  . Decreased potassium in the blood 03/01/2015  . Diverticulosis   . Dizziness 03/01/2015  . GERD (gastroesophageal reflux disease)   . Hiatal hernia   . Hypercholesterolemia 03/01/2015  . Hyperlipidemia   . Hypertension   . Ingrown toenail 08/10/2017  . Inguinal hernia   . Migraine headache   . Onychomycosis 12/08/2017  . Paresthesia of arm 03/01/2015  . Schatzki's ring   . Tendonitis, Achilles, right 12/08/2017  . Unilateral primary osteoarthritis, left knee 10/07/2017    Past Surgical History:  Procedure Laterality Date  . ABDOMINAL HYSTERECTOMY    . BACK SURGERY     x 3  . CARPAL TUNNEL RELEASE     left wrist  . CERVICAL SPINE SURGERY    . HEMORRHOID SURGERY      Social History   Tobacco Use  . Smoking status: Former Smoker    Packs/day: 0.50    Years: 20.00    Pack years: 10.00    Types: Cigarettes    Quit date: 02/19/1984  Years since quitting: 35.3  . Smokeless tobacco: Never Used  Substance Use Topics  . Alcohol use: No  . Drug use: No    Family History  Problem Relation Age of Onset  . Allergic rhinitis Sister   . Diabetes Sister   . Hypertension Sister   . Heart attack Father 67  . Heart disease Father   . Stomach cancer Maternal Grandmother   . Heart disease Mother   . Colon cancer Neg Hx   . Angioedema Neg Hx   . Asthma Neg Hx   . Eczema Neg Hx   . Urticaria Neg Hx   . Immunodeficiency Neg Hx   . Breast cancer Neg Hx   . Migraines Neg Hx   . Headache Neg Hx     Allergies  Allergen Reactions  .  Shellfish Allergy Hives and Swelling  . Ace Inhibitors Other (See Comments)    Pt cannot recall her reaction but tolerates arb   . Pitavastatin   . Topiramate     Heart race,   . Sulfa Antibiotics Other (See Comments) and Rash    Fine bumps    Medication list has been reviewed and updated.  Current Outpatient Medications on File Prior to Visit  Medication Sig Dispense Refill  . aspirin 81 MG tablet Take 81 mg by mouth daily.    . Azelastine HCl 0.15 % SOLN Place 1-2 sprays into both nostrils 2 (two) times daily. 30 mL 5  . Calcium Carbonate (CALCIUM 600 PO) Take 1 tablet by mouth daily.    . fluticasone (FLONASE) 50 MCG/ACT nasal spray USE 2 SPRAYS IN NOSTRIL(S) DAILY AS NEEDED. 16 g 9  . KLOR-CON M20 20 MEQ tablet TAKE 1 TABLET BY MOUTH EVERY DAY 90 tablet 1  . losartan-hydrochlorothiazide (HYZAAR) 50-12.5 MG tablet Take 1 tablet by mouth 2 (two) times daily.    . metoprolol succinate (TOPROL-XL) 25 MG 24 hr tablet Take by mouth.    . Multiple Vitamins-Minerals (MULTIVITAL) tablet Take by mouth.    . NONFORMULARY OR COMPOUNDED ITEM Allergen immunotherapy 2 each 1  . nortriptyline (PAMELOR) 25 MG capsule Take 25 mg by mouth daily.    . pantoprazole (PROTONIX) 40 MG tablet Take 1 tablet (40 mg total) by mouth daily. 30 tablet 0  . rosuvastatin (CRESTOR) 20 MG tablet Take 20 mg by mouth daily.     No current facility-administered medications on file prior to visit.    Review of Systems:  As per HPI- otherwise negative.   Physical Examination: Vitals:   06/02/19 1412  BP: 133/74  Pulse: 77  Resp: 17  Temp: 97.7 F (36.5 C)  SpO2: 97%   Vitals:   06/02/19 1412  Weight: 215 lb (97.5 kg)  Height: 5\' 5"  (1.651 m)   Body mass index is 35.78 kg/m. Ideal Body Weight: Weight in (lb) to have BMI = 25: 149.9  GEN: no acute distress.  Overweight, looks well  HEENT: Atraumatic, Normocephalic.  Ears and Nose: No external deformity. CV: RRR, No M/G/R. No JVD. No thrill. No  extra heart sounds. PULM: CTA B, no wheezes, crackles, rhonchi. No retractions. No resp. distress. No accessory muscle use. ABD: S, NT, ND, +BS. No rebound. No HSM. EXTR: No c/c/e PSYCH: Normally interactive. Conversant.  She has tenderness over the left sciatic notch.  Thoracolumbar flexion extension is normal.  She displays a positive straight leg raise on the left.  Normal bilateral lower extremity strength, sensation, DTR No bony spinal tenderness  Assessment and Plan: Sciatica, left side - Plan: methocarbamol (ROBAXIN) 500 MG tablet, predniSONE (DELTASONE) 20 MG tablet  Hypokalemia - Plan: Comprehensive metabolic panel  Essential hypertension - Plan: CBC, Comprehensive metabolic panel  Hyperlipidemia, unspecified hyperlipidemia type - Plan: Lipid panel  Pre-diabetes - Plan: Hemoglobin A1c  Here today with concern of likely sciatica on the left side Treat with course of prednisone She is having pain with Tylenol, avoid NSAIDs while on prednisone.  Prescribe Robaxin, cautioned her to use this conservatively as it may cause sedation She is asked to let me know if not feeling better in the next few days sooner if worse Routine labs pending as above Will plan further follow- up pending labs.  This visit occurred during the SARS-CoV-2 public health emergency.  Safety protocols were in place, including screening questions prior to the visit, additional usage of staff PPE, and extensive cleaning of exam room while observing appropriate contact time as indicated for disinfecting solutions.  Moderate medical decision making today  Signed Lamar Blinks, MD  Received her labs, 4/16 Message to patient  Results for orders placed or performed in visit on 06/02/19  CBC  Result Value Ref Range   WBC 7.0 4.0 - 10.5 K/uL   RBC 3.73 (L) 3.87 - 5.11 Mil/uL   Platelets 330.0 150.0 - 400.0 K/uL   Hemoglobin 11.0 (L) 12.0 - 15.0 g/dL   HCT 33.2 (L) 36.0 - 46.0 %   MCV 88.9 78.0 - 100.0 fl    MCHC 33.1 30.0 - 36.0 g/dL   RDW 14.5 11.5 - 15.5 %  Comprehensive metabolic panel  Result Value Ref Range   Sodium 140 135 - 145 mEq/L   Potassium 3.7 3.5 - 5.1 mEq/L   Chloride 100 96 - 112 mEq/L   CO2 35 (H) 19 - 32 mEq/L   Glucose, Bld 94 70 - 99 mg/dL   BUN 12 6 - 23 mg/dL   Creatinine, Ser 0.87 0.40 - 1.20 mg/dL   Total Bilirubin 0.3 0.2 - 1.2 mg/dL   Alkaline Phosphatase 62 39 - 117 U/L   AST 20 0 - 37 U/L   ALT 14 0 - 35 U/L   Total Protein 6.3 6.0 - 8.3 g/dL   Albumin 3.7 3.5 - 5.2 g/dL   GFR 77.09 >60.00 mL/min   Calcium 9.4 8.4 - 10.5 mg/dL  Hemoglobin A1c  Result Value Ref Range   Hgb A1c MFr Bld 5.9 4.6 - 6.5 %  Lipid panel  Result Value Ref Range   Cholesterol 161 0 - 200 mg/dL   Triglycerides 86.0 0.0 - 149.0 mg/dL   HDL 56.00 >39.00 mg/dL   VLDL 17.2 0.0 - 40.0 mg/dL   LDL Cholesterol 87 0 - 99 mg/dL   Total CHOL/HDL Ratio 3    NonHDL 104.55     A1c is stable in prediabetes range Mild anemia is baseline going back to 2013 Colonoscopy done last year Cholesterol favorable on Crestor

## 2019-06-02 NOTE — Patient Instructions (Signed)
Good to see you today- I will be in touch with your labs asap  We will try a course of prednisone for your back and hip pain which I think is due to sciatic nerve pain You can also try robaxin as needed- however be cautious as this can cause sedation  Please let me know if your leg is not feeling better in the next few days Ok to continue tylenol as needed

## 2019-06-03 LAB — COMPREHENSIVE METABOLIC PANEL
ALT: 14 U/L (ref 0–35)
AST: 20 U/L (ref 0–37)
Albumin: 3.7 g/dL (ref 3.5–5.2)
Alkaline Phosphatase: 62 U/L (ref 39–117)
BUN: 12 mg/dL (ref 6–23)
CO2: 35 mEq/L — ABNORMAL HIGH (ref 19–32)
Calcium: 9.4 mg/dL (ref 8.4–10.5)
Chloride: 100 mEq/L (ref 96–112)
Creatinine, Ser: 0.87 mg/dL (ref 0.40–1.20)
GFR: 77.09 mL/min (ref >60.00)
Glucose, Bld: 94 mg/dL (ref 70–99)
Potassium: 3.7 mEq/L (ref 3.5–5.1)
Sodium: 140 mEq/L (ref 135–145)
Total Bilirubin: 0.3 mg/dL (ref 0.2–1.2)
Total Protein: 6.3 g/dL (ref 6.0–8.3)

## 2019-06-03 LAB — CBC
HCT: 33.2 % — ABNORMAL LOW (ref 36.0–46.0)
Hemoglobin: 11 g/dL — ABNORMAL LOW (ref 12.0–15.0)
MCHC: 33.1 g/dL (ref 30.0–36.0)
MCV: 88.9 fl (ref 78.0–100.0)
Platelets: 330 10*3/uL (ref 150.0–400.0)
RBC: 3.73 Mil/uL — ABNORMAL LOW (ref 3.87–5.11)
RDW: 14.5 % (ref 11.5–15.5)
WBC: 7 10*3/uL (ref 4.0–10.5)

## 2019-06-03 LAB — LIPID PANEL
Cholesterol: 161 mg/dL (ref 0–200)
HDL: 56 mg/dL (ref >39.00)
LDL Cholesterol: 87 mg/dL (ref 0–99)
NonHDL: 104.55
Total CHOL/HDL Ratio: 3
Triglycerides: 86 mg/dL (ref 0.0–149.0)
VLDL: 17.2 mg/dL (ref 0.0–40.0)

## 2019-06-03 LAB — HEMOGLOBIN A1C: Hgb A1c MFr Bld: 5.9 % (ref 4.6–6.5)

## 2019-06-04 ENCOUNTER — Encounter: Payer: Self-pay | Admitting: Family Medicine

## 2019-06-07 DIAGNOSIS — J3089 Other allergic rhinitis: Secondary | ICD-10-CM | POA: Diagnosis not present

## 2019-06-07 NOTE — Progress Notes (Signed)
Exp 06/06/20

## 2019-06-08 ENCOUNTER — Other Ambulatory Visit: Payer: Self-pay | Admitting: *Deleted

## 2019-06-08 NOTE — Patient Outreach (Signed)
[facility] Care Management  06/08/2019  Stephanie Parks 09-26-1945 [id number]   Quarterly call: Unanswered, left message for a return call.  06/11/19: Second outreach for quarterly follow up:  HTN:  Dizziness:  Advanced Care Planning:  I am sending an unsuccessful letter outreach.    [name]. [name], MSN, [facility] Gerontological Nurse Practitioner [facility] Care Management [phone]

## 2019-06-11 ENCOUNTER — Encounter: Payer: Self-pay | Admitting: *Deleted

## 2019-06-14 ENCOUNTER — Other Ambulatory Visit: Payer: Self-pay | Admitting: *Deleted

## 2019-06-14 ENCOUNTER — Telehealth: Payer: Self-pay | Admitting: Pediatrics

## 2019-06-14 ENCOUNTER — Telehealth: Payer: Self-pay

## 2019-06-14 DIAGNOSIS — M5432 Sciatica, left side: Secondary | ICD-10-CM

## 2019-06-14 NOTE — Telephone Encounter (Signed)
Does patient need appt with you or just xray placed?

## 2019-06-14 NOTE — Telephone Encounter (Signed)
Dr. Shaune Leeks please advise.

## 2019-06-14 NOTE — Telephone Encounter (Signed)
Call patient back.  Prednisone did help while she was taking it, but then her symptoms returned I ordered lumbar spine films to be done at [facility], she will plan to have this done tomorrow

## 2019-06-14 NOTE — Telephone Encounter (Signed)
Patient states symptoms (hearing loss in right ear and light headedness) have not improved from last visit. Please advise

## 2019-06-14 NOTE — Telephone Encounter (Signed)
Patient called in to report that the medication prescribed for her left legg did not do any good. Per the patient she would like to come in to be seen to have an Xray. Please follow up with the patient and advise at [phone]

## 2019-06-14 NOTE — Telephone Encounter (Signed)
She should see an ENT specialist

## 2019-06-14 NOTE — Patient Outreach (Signed)
[facility]) Care Management  06/14/2019  Stephanie Parks [birth year] [id number]   [name] returned my call today. She reports she is doing good in general with an occasional issue. Most recently she has had a dental need and is in the process of having this investigated.  Her blood pressure is usually in her target range but she does have an occasional elevated reading.  She reports infrequent spell of dizziness.  She did return to work at [employer] and now she has interviewed for a doctor's office job and is waiting to hear from them on the verdict.  She continues taking her meds as prescribed. Is attending all her MD appts.  Reinforced she can call me if she has an unresolved problem.  ONGOING GOAL: PATIENT WILL CONTINUE TO MONITOR HER BP AT HOME AND REPORT ANY HEALTH PROBLEMS TO HER PROVIDER, EARLY, TO PREVENT COMPLICATIONS.  I will call her again in July.  [name]. [name], MSN, [facility] Gerontological Nurse Practitioner [facility] Care Management [phone]

## 2019-06-15 ENCOUNTER — Other Ambulatory Visit: Payer: Self-pay

## 2019-06-15 ENCOUNTER — Ambulatory Visit
Admission: RE | Admit: 2019-06-15 | Discharge: 2019-06-15 | Disposition: A | Discharge status: Home / Self Care | Payer: Medicare Other | Source: Ambulatory Visit | Attending: Family Medicine | Admitting: Family Medicine

## 2019-06-15 DIAGNOSIS — M5432 Sciatica, left side: Secondary | ICD-10-CM

## 2019-06-15 DIAGNOSIS — M5430 Sciatica, unspecified side: Secondary | ICD-10-CM | POA: Diagnosis not present

## 2019-06-15 NOTE — Telephone Encounter (Signed)
Lm for pt to call us back to inform her of dr b's recommendation

## 2019-06-15 NOTE — Telephone Encounter (Signed)
Informed pt of what dr b stated and gave her number to dr Laurance Flatten in [place] as that is the closes one to her

## 2019-06-17 ENCOUNTER — Encounter: Payer: Self-pay | Admitting: Family Medicine

## 2019-06-19 DIAGNOSIS — G4733 Obstructive sleep apnea (adult) (pediatric): Secondary | ICD-10-CM | POA: Diagnosis not present

## 2019-06-23 ENCOUNTER — Ambulatory Visit: Payer: Medicare Other | Admitting: *Deleted

## 2019-06-23 DIAGNOSIS — H6983 Other specified disorders of Eustachian tube, bilateral: Secondary | ICD-10-CM | POA: Diagnosis not present

## 2019-06-23 DIAGNOSIS — R42 Dizziness and giddiness: Secondary | ICD-10-CM | POA: Diagnosis not present

## 2019-06-24 DIAGNOSIS — G4733 Obstructive sleep apnea (adult) (pediatric): Secondary | ICD-10-CM | POA: Diagnosis not present

## 2019-06-28 ENCOUNTER — Ambulatory Visit (INDEPENDENT_AMBULATORY_CARE_PROVIDER_SITE_OTHER): Payer: Medicare Other

## 2019-06-28 ENCOUNTER — Other Ambulatory Visit: Payer: Self-pay

## 2019-06-28 DIAGNOSIS — J309 Allergic rhinitis, unspecified: Secondary | ICD-10-CM

## 2019-06-28 NOTE — Progress Notes (Signed)
Immunotherapy   Patient Details  Name: Stephanie Parks MRN: [id number] Date of Birth: Feb 28, 1945  06/28/2019  [name] here to pick up MITE-CR-WEED Following schedule: C  Frequency:1 time per week, AT .50 Q 4 weeks Epi-Pen:Epi-Pen Available  Consent signed and patient instructions given. Per patient she [facility] by her daughter.   Burtis Imhoff 06/28/2019, 9:24 AM

## 2019-06-30 ENCOUNTER — Telehealth: Payer: Self-pay | Admitting: Family Medicine

## 2019-06-30 DIAGNOSIS — M5432 Sciatica, left side: Secondary | ICD-10-CM

## 2019-06-30 MED ORDER — METHOCARBAMOL 500 MG PO TABS: 500.0000 mg | ORAL_TABLET | Freq: Three times a day (TID) | ORAL | 0 refills | Status: DC | PRN

## 2019-06-30 NOTE — Telephone Encounter (Signed)
Medication: methocarbamol (ROBAXIN) 500 MG tablet [id number]    Has the patient contacted their pharmacy? No. (If no, request that the patient contact the pharmacy for the refill.) (If yes, when and what did the pharmacy advise?)  Preferred Pharmacy (with phone number or street name): [id number] [place], [place] [address]  [address], [place] [address]  Phone:  [phone] Fax:  [phone]  DEA #:  [id number]  Agent: Please be advised that RX refills may take up to 3 business days. We ask that you follow-up with your pharmacy.

## 2019-06-30 NOTE — Addendum Note (Signed)
Addended by: Lamar Blinks C on: 06/30/2019 01:17 PM   Modules accepted: Orders

## 2019-06-30 NOTE — Telephone Encounter (Signed)
Ok to refill 

## 2019-07-01 DIAGNOSIS — M5442 Lumbago with sciatica, left side: Secondary | ICD-10-CM | POA: Diagnosis not present

## 2019-07-05 ENCOUNTER — Other Ambulatory Visit: Payer: Self-pay | Admitting: Neurosurgery

## 2019-07-05 ENCOUNTER — Encounter: Payer: Self-pay | Admitting: Medical

## 2019-07-05 ENCOUNTER — Ambulatory Visit (INDEPENDENT_AMBULATORY_CARE_PROVIDER_SITE_OTHER): Payer: Medicare Other | Admitting: Medical

## 2019-07-05 ENCOUNTER — Other Ambulatory Visit: Payer: Self-pay

## 2019-07-05 VITALS — BP 143/67 | HR 92 | Resp 18 | Ht 65.0 in | Wt 213.8 lb

## 2019-07-05 DIAGNOSIS — M5432 Sciatica, left side: Secondary | ICD-10-CM

## 2019-07-05 DIAGNOSIS — I671 Cerebral aneurysm, nonruptured: Secondary | ICD-10-CM

## 2019-07-05 DIAGNOSIS — M5442 Lumbago with sciatica, left side: Secondary | ICD-10-CM

## 2019-07-05 MED ORDER — METHYLPREDNISOLONE ACETATE 40 MG/ML IJ SUSP
40.0000 mg | Freq: Once | INTRAMUSCULAR | Status: AC
Start: 2019-07-05 — End: 2019-07-05
  Administered 2019-07-05: 40 mg via INTRAMUSCULAR

## 2019-07-05 MED ORDER — TRAMADOL HCL 50 MG PO TABS: ORAL_TABLET | ORAL | 0 refills | Status: DC

## 2019-07-05 NOTE — Patient Instructions (Signed)
For your recent sciatica type pain with radicular pain in the leg region, we did give you Depo Medrol 40 mg IM.  Hopefully this will help since prednisone last month seem to help decrease her pain.  Continue Robaxin muscle relaxant.  We will add a course of tramadol to see if this helps with your neuropathic type pain.  Rx advisement given regarding tramadol.  Particularly possibility of sedation.  Use at night for starters and see if any adverse side effects.  Then can expand to daily use if needed.  Follow through with neurosurgery's recommendations.  I do not see any recent x-ray of your left hip or knee.  Although I do not think you can you please imaging x-rays.  If your MRI did not reveal source of pain you might need to track down left hip and knee x-rays.  Follow-up in 10 to 14 days or as needed.

## 2019-07-05 NOTE — Progress Notes (Signed)
Subjective:    Patient ID: Stephanie Parks, female    DOB: 01-24-1946, 74 y.o.   MRN: [id number]  HPI  Pt in for recent left leg pain. Pt saw her pcp in in early April.   hpi from pcp visit   Here today for follow-up and concern of leg pain- LEFT side- present for 2-3 weeks Tylenol helps some but does not completely relieve her symptoms No injury that she can recall It seems to run down the leg and into the calf Shaking her leg may help-shooting nerve type pain  She is not having back pain No leg numbness or weakness  A/P on that day below.  Here today with concern of likely sciatica on the left side Treat with course of prednisone She is having pain with Tylenol, avoid NSAIDs while on prednisone.  Prescribe Robaxin, cautioned her to use this conservatively as it may cause sedation She is asked to let me know if not feeling better in the next few days sooner if worse  The above tx helped for about 5-7 days but pain came back afterwards.  Pt has approval for mri pending.  Pt has mri order pending for her lumbar spine. Neurosurgeon put in order. She saw him on Thursday.   On review of pt med list I don't see gabapentin or tramadol.   Pt is not diabetic.  Pt pain from left knee radiating to her left hip area.   Xray of lumbar spine shows. IMPRESSION: No fracture or dislocation of the lumbar spine. There is moderate multilevel disc space height loss and osteophytosis of the lumbar spine, worst at L4-L5 and L5-S1. Moderate facet degenerative change of the lower lumbar levels. Degenerative findings are not significantly changed compared to prior examination dated 10/31/2014. Lumbar disc and neural foraminal pathology may be further evaluated by MRI if indicated by neurologically localizing signs and symptoms.   2019 xrays that I can see show.  IMPRESSION: Moderate degenerative joint disease is noted in the left knee. No acute abnormality is noted.   Pt tells me  at [facility] she had xray of left hip and knee. She states told studies ok. I don't see those reports.      Review of Systems     Objective:   Physical Exam  General- No acute distress. Pleasant patient. Neck- Full range of motion, no jvd Lungs- Clear, even and unlabored. Heart- regular rate and rhythm. Neurologic- CNII- XII grossly intact. Back- mild lt si are tenderness. Left lower ext- on movement reports pain from left hip to lateral knee.      Assessment & Plan:  For your recent sciatica type pain with radicular pain in the leg region, we did give you Depo Medrol 40 mg IM.  Hopefully this will help since prednisone last month seem to help decrease her pain.  Continue Robaxin muscle relaxant.  We will add a course of tramadol to see if this helps with your neuropathic type pain.  Rx advisement given regarding tramadol.  Particularly possibility of sedation.  Use at night for starters and see if any adverse side effects.  Then can expand to daily use if needed.  Follow through with neurosurgery's recommendations.  I do not see any recent x-ray of your left hip or knee.  Although I do not think you can you please imaging x-rays.  If your MRI did not reveal source of pain you might need to track down left hip and knee x-rays.  Follow-up  in 10 to 14 days or as needed.

## 2019-07-10 ENCOUNTER — Encounter (HOSPITAL_BASED_OUTPATIENT_CLINIC_OR_DEPARTMENT_OTHER): Payer: Self-pay | Admitting: Emergency Medicine

## 2019-07-10 ENCOUNTER — Emergency Department (HOSPITAL_BASED_OUTPATIENT_CLINIC_OR_DEPARTMENT_OTHER)
Admission: EM | Admit: 2019-07-10 | Discharge: 2019-07-10 | Disposition: A | Discharge status: Home / Self Care | Payer: Medicare Other | Attending: Emergency Medicine | Admitting: Emergency Medicine

## 2019-07-10 ENCOUNTER — Other Ambulatory Visit: Payer: Self-pay

## 2019-07-10 ENCOUNTER — Emergency Department (HOSPITAL_BASED_OUTPATIENT_CLINIC_OR_DEPARTMENT_OTHER): Payer: Medicare Other

## 2019-07-10 DIAGNOSIS — Z7982 Long term (current) use of aspirin: Secondary | ICD-10-CM | POA: Diagnosis not present

## 2019-07-10 DIAGNOSIS — R002 Palpitations: Secondary | ICD-10-CM | POA: Diagnosis not present

## 2019-07-10 DIAGNOSIS — I10 Essential (primary) hypertension: Secondary | ICD-10-CM | POA: Insufficient documentation

## 2019-07-10 DIAGNOSIS — Z79899 Other long term (current) drug therapy: Secondary | ICD-10-CM | POA: Diagnosis not present

## 2019-07-10 DIAGNOSIS — Z87891 Personal history of nicotine dependence: Secondary | ICD-10-CM | POA: Diagnosis not present

## 2019-07-10 LAB — TROPONIN I (HIGH SENSITIVITY): Troponin I (High Sensitivity): 3 ng/L (ref <18)

## 2019-07-10 LAB — CBC
HCT: 34.9 % — ABNORMAL LOW (ref 36.0–46.0)
Hemoglobin: 11.5 g/dL — ABNORMAL LOW (ref 12.0–15.0)
MCH: 29.4 pg (ref 26.0–34.0)
MCHC: 33 g/dL (ref 30.0–36.0)
MCV: 89.3 fL (ref 80.0–100.0)
Platelets: 380 10*3/uL (ref 150–400)
RBC: 3.91 MIL/uL (ref 3.87–5.11)
RDW: 14.9 % (ref 11.5–15.5)
WBC: 9.3 10*3/uL (ref 4.0–10.5)
nRBC: 0 % (ref 0.0–0.2)

## 2019-07-10 LAB — BASIC METABOLIC PANEL
Anion gap: 11 (ref 5–15)
BUN: 16 mg/dL (ref 8–23)
CO2: 27 mmol/L (ref 22–32)
Calcium: 9.3 mg/dL (ref 8.9–10.3)
Chloride: 101 mmol/L (ref 98–111)
Creatinine, Ser: 0.88 mg/dL (ref 0.44–1.00)
GFR calc Af Amer: >60 mL/min (ref >60)
GFR calc non Af Amer: >60 mL/min (ref >60)
Glucose, Bld: 101 mg/dL — ABNORMAL HIGH (ref 70–99)
Potassium: 3.6 mmol/L (ref 3.5–5.1)
Sodium: 139 mmol/L (ref 135–145)

## 2019-07-10 MED ORDER — METOPROLOL SUCCINATE ER 25 MG PO TB24
ORAL_TABLET | ORAL | Status: AC
  Filled 2019-07-10: qty 1

## 2019-07-10 MED ORDER — METOPROLOL SUCCINATE ER 25 MG PO TB24
12.5000 mg | ORAL_TABLET | Freq: Every day | ORAL | Status: DC
  Administered 2019-07-10: 12.5 mg via ORAL

## 2019-07-10 MED ORDER — METOPROLOL SUCCINATE ER 25 MG PO TB24
12.5000 mg | ORAL_TABLET | Freq: Every day | ORAL | 0 refills | Status: DC
Start: 2019-07-10 — End: 2019-09-06

## 2019-07-10 NOTE — ED Provider Notes (Signed)
[facility]ERG[facility] DEPARTMENT Provider Note   CSN: [id number] Arrival date & time: 07/10/19  1614     History Chief Complaint  Patient presents with  . Palpitations    Stephanie Parks is a 74 y.o. female.  Pt presents to the ED today with palpitations and elevated HR.  Pt said she takes losartan-hctz daily.  She was on metoprolol, but it did not get refilled and she thinks she needs to go back on it.  Pt said she feels like her hr goes up then goes down.  She denies cp.  BP has been in the 140s to 150s.  I saw a cardiology note in Epic where she was on metoprolol 25 mg daily.  There was some concern that her bp was too low.          Past Medical History:  Diagnosis Date  . 3-vessel CAD 03/01/2015  . Achalasia 09/28/2012  . Allergic rhinitis 03/01/2015  . Anemia   . Aneurysm ([place]) 03/01/2015  . BP (high blood pressure) 03/01/2015  . Bronchitis 04/23/2018  . Cephalalgia 08/24/2012   Overview:  ICD-10 cut over    . Chest pain 04/04/2011  . CN (constipation) 09/28/2012  . Coughing 04/24/2017  . Decreased potassium in the blood 03/01/2015  . Diverticulosis   . Dizziness 03/01/2015  . GERD (gastroesophageal reflux disease)   . Hiatal hernia   . Hypercholesterolemia 03/01/2015  . Hyperlipidemia   . Hypertension   . Ingrown toenail 08/10/2017  . Inguinal hernia   . Migraine headache   . Onychomycosis 12/08/2017  . Paresthesia of arm 03/01/2015  . Schatzki's ring   . Tendonitis, Achilles, right 12/08/2017  . Unilateral primary osteoarthritis, left knee 10/07/2017    Patient Active Problem List   Diagnosis Date Noted  . Pre-diabetes 06/02/2019  . Seasonal and perennial allergic rhinitis 12/22/2018  . Heart palpitations 12/22/2018  . History of chest pain 07/20/2018  . Brain aneurysm 07/20/2018  . Bronchitis 04/23/2018  . Tendonitis, Achilles, right 12/08/2017  . Onychomycosis 12/08/2017  . Unilateral primary osteoarthritis, left knee 10/07/2017  . Ingrown toenail  08/10/2017  . Coughing 04/24/2017  . CAD (coronary artery disease) 03/01/2015  . Aneurysm ([place]) 03/01/2015  . Dizziness 03/01/2015  . Paresthesia of arm 03/01/2015  . Allergic rhinitis 03/01/2015  . Achalasia 09/28/2012  . CN (constipation) 09/28/2012  . Cephalalgia 08/24/2012  . HTN (hypertension) 04/19/2011  . Hyperlipidemia 04/19/2011  . GERD (gastroesophageal reflux disease) 04/19/2011  . Chest pain 04/04/2011    Past Surgical History:  Procedure Laterality Date  . ABDOMINAL HYSTERECTOMY    . BACK SURGERY     x 3  . CARPAL TUNNEL RELEASE     left wrist  . CERVICAL SPINE SURGERY    . HEMORRHOID SURGERY       OB History   No obstetric history on file.     Family History  Problem Relation Age of Onset  . Allergic rhinitis Sister   . Diabetes Sister   . Hypertension Sister   . Heart attack Father 61  . Heart disease Father   . Stomach cancer Maternal Grandmother   . Heart disease Mother   . Colon cancer Neg Hx   . Angioedema Neg Hx   . Asthma Neg Hx   . Eczema Neg Hx   . Urticaria Neg Hx   . Immunodeficiency Neg Hx   . Breast cancer Neg Hx   . Migraines Neg Hx   . Headache Neg  Hx     Social History   Tobacco Use  . Smoking status: Former Smoker    Packs/day: 0.50    Years: 20.00    Pack years: 10.00    Types: Cigarettes    Quit date: 02/19/1984    Years since quitting: 35.4  . Smokeless tobacco: Never Used  Substance Use Topics  . Alcohol use: No  . Drug use: No    Home Medications Prior to Admission medications   Medication Sig Start Date End Date Taking? Authorizing Provider  aspirin 81 MG tablet Take 81 mg by mouth daily.    [provider]  Azelastine HCl 0.15 % SOLN Place 1-2 sprays into both nostrils 2 (two) times daily. 12/22/18   Dara Hoyer, FNP  Calcium Carbonate (CALCIUM 600 PO) Take 1 tablet by mouth daily.    [provider]  fluticasone (FLONASE) 50 MCG/ACT nasal spray USE 2 SPRAYS IN NOSTRIL(S) DAILY AS NEEDED.  12/22/18   Ambs, Kathrine Cords, FNP  KLOR-CON M20 20 MEQ tablet TAKE 1 TABLET BY MOUTH EVERY DAY 05/25/19   Copland, Gay Filler, MD  losartan-hydrochlorothiazide (HYZAAR) 50-12.5 MG tablet Take 1 tablet by mouth 2 (two) times daily. 01/16/19   [provider]  methocarbamol (ROBAXIN) 500 MG tablet Take 1 tablet (500 mg total) by mouth every 8 (eight) hours as needed for muscle spasms. 06/30/19   Copland, Gay Filler, MD  metoprolol succinate (TOPROL-XL) 25 MG 24 hr tablet Take 0.5 tablets (12.5 mg total) by mouth daily. 07/10/19   Isla Pence, MD  Multiple Vitamins-Minerals (MULTIVITAL) tablet Take by mouth.    [provider]  NONFORMULARY OR COMPOUNDED ITEM Allergen immunotherapy 03/01/15   Leda Roys, MD  nortriptyline (PAMELOR) 25 MG capsule Take 25 mg by mouth daily. 02/22/19   [provider]  pantoprazole (PROTONIX) 40 MG tablet Take 1 tablet (40 mg total) by mouth daily. 02/22/19   Pyrtle, Lajuan Lines, MD  predniSONE (DELTASONE) 20 MG tablet Take 2 pills daily for 4 days, then 1 pill daily for 4 days 06/02/19   Copland, Gay Filler, MD  rosuvastatin (CRESTOR) 20 MG tablet Take 20 mg by mouth daily.    [provider]  traMADol Veatrice Bourbon) 50 MG tablet 1 tab po q 6 hours prn pain 07/05/19   Saguier, Percell Miller, PA-C    Allergies    Shellfish allergy, Ace inhibitors, Pitavastatin, Topiramate, and Sulfa antibiotics  Review of Systems   Review of Systems  Cardiovascular: Positive for palpitations.  All other systems reviewed and are negative.   Physical Exam Updated Vital Signs BP (!) 142/64   Pulse 90   Temp 98.7 F (37.1 C) (Oral)   Resp 20   Ht 5\' 5"  (1.651 m)   Wt 96.2 kg   SpO2 100%   BMI 35.28 kg/m   Physical Exam Vitals and nursing note reviewed.  Constitutional:      Appearance: Normal appearance.  HENT:     Head: Normocephalic and atraumatic.     Right Ear: External ear normal.     Left Ear: External ear normal.     Nose: Nose normal.     Mouth/Throat:      Mouth: Mucous membranes are moist.     Pharynx: Oropharynx is clear.  Eyes:     Extraocular Movements: Extraocular movements intact.     Conjunctiva/sclera: Conjunctivae normal.     Pupils: Pupils are equal, round, and reactive to light.  Cardiovascular:     Rate and  Rhythm: Normal rate and regular rhythm.     Pulses: Normal pulses.     Heart sounds: Normal heart sounds.  Pulmonary:     Effort: Pulmonary effort is normal.     Breath sounds: Normal breath sounds.  Abdominal:     General: Abdomen is flat. Bowel sounds are normal.     Palpations: Abdomen is soft.  Musculoskeletal:        General: Normal range of motion.     Cervical back: Normal range of motion and neck supple.  Skin:    General: Skin is warm.     Capillary Refill: Capillary refill takes less than 2 seconds.  Neurological:     General: No focal deficit present.     Mental Status: She is alert and oriented to person, place, and time.  Psychiatric:        Mood and Affect: Mood normal.        Behavior: Behavior normal.        Thought Content: Thought content normal.        Judgment: Judgment normal.     ED Results / Procedures / Treatments   Labs (all labs ordered are listed, but only abnormal results are displayed) Labs Reviewed  BASIC METABOLIC PANEL - Abnormal; Notable for the following components:      Result Value   Glucose, Bld 101 (*)    All other components within normal limits  CBC - Abnormal; Notable for the following components:   Hemoglobin 11.5 (*)    HCT 34.9 (*)    All other components within normal limits  TSH  TROPONIN I (HIGH SENSITIVITY)    EKG EKG Interpretation  Date/Time:  Saturday Jul 10 2019 16:18:13 EDT Ventricular Rate:  96 PR Interval:  182 QRS Duration: 88 QT Interval:  352 QTC Calculation: 444 R Axis:   8 Text Interpretation: Normal sinus rhythm Normal ECG No significant change since last tracing Confirmed by Isla Pence [phone]) on 07/10/2019 4:42:40 PM    Radiology DG Chest 2 View  Result Date: 07/10/2019 CLINICAL DATA:  Heart palpitations, hypertension EXAM: CHEST - 2 VIEW COMPARISON:  01/11/2019 FINDINGS: Frontal and lateral views of the chest demonstrate an unremarkable cardiac silhouette. No airspace disease, effusion, or pneumothorax. No acute bony abnormalities. IMPRESSION: 1. No acute intrathoracic process. Electronically Signed   By: Randa Ngo M.D.   On: 07/10/2019 18:07    Procedures Procedures (including critical care time)  Medications Ordered in ED Medications  metoprolol succinate (TOPROL-XL) 24 hr tablet 12.5 mg ( Oral Not Given 07/10/19 1809)    ED Course  I have reviewed the triage vital signs and the nursing notes.  Pertinent labs & imaging results that were available during my care of the patient were reviewed by me and considered in my medical decision making (see chart for details).    MDM Rules/Calculators/A&P                      Work up is reassuring.  Pt will be started on Toprol XL at 12.5 mg as there was a question that the 25 mg was dropping her bp too low.  Pt knows to return if worse.  F/u with pcp.  Final Clinical Impression(s) / ED Diagnoses Final diagnoses:  Heart palpitations  Essential hypertension    Rx / DC Orders ED Discharge Orders         Ordered    metoprolol succinate (TOPROL-XL) 25 MG 24 hr tablet  Daily     07/10/19 1842           Isla Pence, MD 07/10/19 1844

## 2019-07-10 NOTE — ED Triage Notes (Signed)
Pt c/o palpitations and HTN since Monday. Denies pain.

## 2019-07-11 LAB — TSH: TSH: 1.988 u[IU]/mL (ref 0.350–4.500)

## 2019-07-11 NOTE — Progress Notes (Deleted)
[facility] at [facility] [address] [place], [place] [address] [phone]  Date:  07/14/2019   Name:  Stephanie Parks   DOB:  08-31-1945   MRN:  [id number]  PCP:  [name], MD    Chief Complaint: No chief complaint on file.   History of Present Illness:  Stephanie Parks is a 74 y.o. very pleasant female patient who presents with the following:  Here today for a short term follow-up Seen by Mackie Pai last week with recurrent left sided sciatica pain.  Given depo medrol  Then seen in the ER on 5/22 with concern of palpitations and tachycardia.  She was started on toprol xl at 12.5 mg  She has an MRI lumbar and CT angio per Dr Christella Noa scheduled for 6/17  covid vaccine  Patient Active Problem List   Diagnosis Date Noted  . Pre-diabetes 06/02/2019  . Seasonal and perennial allergic rhinitis 12/22/2018  . Heart palpitations 12/22/2018  . History of chest pain 07/20/2018  . Brain aneurysm 07/20/2018  . Bronchitis 04/23/2018  . Tendonitis, Achilles, right 12/08/2017  . Onychomycosis 12/08/2017  . Unilateral primary osteoarthritis, left knee 10/07/2017  . Ingrown toenail 08/10/2017  . Coughing 04/24/2017  . CAD (coronary artery disease) 03/01/2015  . Aneurysm ([place]) 03/01/2015  . Dizziness 03/01/2015  . Paresthesia of arm 03/01/2015  . Allergic rhinitis 03/01/2015  . Achalasia 09/28/2012  . CN (constipation) 09/28/2012  . Cephalalgia 08/24/2012  . HTN (hypertension) 04/19/2011  . Hyperlipidemia 04/19/2011  . GERD (gastroesophageal reflux disease) 04/19/2011  . Chest pain 04/04/2011    Past Medical History:  Diagnosis Date  . 3-vessel CAD 03/01/2015  . Achalasia 09/28/2012  . Allergic rhinitis 03/01/2015  . Anemia   . Aneurysm ([place]) 03/01/2015  . BP (high blood pressure) 03/01/2015  . Bronchitis 04/23/2018  . Cephalalgia 08/24/2012   Overview:  ICD-10 cut over    . Chest pain 04/04/2011  . CN  (constipation) 09/28/2012  . Coughing 04/24/2017  . Decreased potassium in the blood 03/01/2015  . Diverticulosis   . Dizziness 03/01/2015  . GERD (gastroesophageal reflux disease)   . Hiatal hernia   . Hypercholesterolemia 03/01/2015  . Hyperlipidemia   . Hypertension   . Ingrown toenail 08/10/2017  . Inguinal hernia   . Migraine headache   . Onychomycosis 12/08/2017  . Paresthesia of arm 03/01/2015  . Schatzki's ring   . Tendonitis, Achilles, right 12/08/2017  . Unilateral primary osteoarthritis, left knee 10/07/2017    Past Surgical History:  Procedure Laterality Date  . ABDOMINAL HYSTERECTOMY    . BACK SURGERY     x 3  . CARPAL TUNNEL RELEASE     left wrist  . CERVICAL SPINE SURGERY    . HEMORRHOID SURGERY      Social History   Tobacco Use  . Smoking status: Former Smoker    Packs/day: 0.50    Years: 20.00    Pack years: 10.00    Types: Cigarettes    Quit date: 02/19/1984    Years since quitting: 35.4  . Smokeless tobacco: Never Used  Substance Use Topics  . Alcohol use: No  . Drug use: No    Family History  Problem Relation Age of Onset  . Allergic rhinitis Sister   . Diabetes Sister   . Hypertension Sister   . Heart attack Father 69  . Heart disease Father   . Stomach cancer Maternal Grandmother   .  Heart disease Mother   . Colon cancer Neg Hx   . Angioedema Neg Hx   . Asthma Neg Hx   . Eczema Neg Hx   . Urticaria Neg Hx   . Immunodeficiency Neg Hx   . Breast cancer Neg Hx   . Migraines Neg Hx   . Headache Neg Hx     Allergies  Allergen Reactions  . Shellfish Allergy Hives and Swelling  . Ace Inhibitors Other (See Comments)    Pt cannot recall her reaction but tolerates arb   . Pitavastatin   . Topiramate     Heart race,   . Sulfa Antibiotics Other (See Comments) and Rash    Fine bumps    Medication list has been reviewed and updated.  Current Outpatient Medications on File Prior to Visit  Medication Sig Dispense Refill  . aspirin 81 MG  tablet Take 81 mg by mouth daily.    . Azelastine HCl 0.15 % SOLN Place 1-2 sprays into both nostrils 2 (two) times daily. 30 mL 5  . Calcium Carbonate (CALCIUM 600 PO) Take 1 tablet by mouth daily.    . fluticasone (FLONASE) 50 MCG/ACT nasal spray USE 2 SPRAYS IN NOSTRIL(S) DAILY AS NEEDED. 16 g 9  . KLOR-CON M20 20 MEQ tablet TAKE 1 TABLET BY MOUTH EVERY DAY 90 tablet 1  . losartan-hydrochlorothiazide (HYZAAR) 50-12.5 MG tablet Take 1 tablet by mouth 2 (two) times daily.    . methocarbamol (ROBAXIN) 500 MG tablet Take 1 tablet (500 mg total) by mouth every 8 (eight) hours as needed for muscle spasms. 20 tablet 0  . metoprolol succinate (TOPROL-XL) 25 MG 24 hr tablet Take 0.5 tablets (12.5 mg total) by mouth daily. 30 tablet 0  . Multiple Vitamins-Minerals (MULTIVITAL) tablet Take by mouth.    . NONFORMULARY OR COMPOUNDED ITEM Allergen immunotherapy 2 each 1  . nortriptyline (PAMELOR) 25 MG capsule Take 25 mg by mouth daily.    . pantoprazole (PROTONIX) 40 MG tablet Take 1 tablet (40 mg total) by mouth daily. 30 tablet 0  . predniSONE (DELTASONE) 20 MG tablet Take 2 pills daily for 4 days, then 1 pill daily for 4 days 12 tablet 0  . rosuvastatin (CRESTOR) 20 MG tablet Take 20 mg by mouth daily.    . traMADol (ULTRAM) 50 MG tablet 1 tab po q 6 hours prn pain 16 tablet 0   No current facility-administered medications on file prior to visit.    Review of Systems:  As per HPI- otherwise negative.   Physical Examination: There were no vitals filed for this visit. There were no vitals filed for this visit. There is no height or weight on file to calculate BMI. Ideal Body Weight:    GEN: no acute distress. HEENT: Atraumatic, Normocephalic.  Ears and Nose: No external deformity. CV: RRR, No M/G/R. No JVD. No thrill. No extra heart sounds. PULM: CTA B, no wheezes, crackles, rhonchi. No retractions. No resp. distress. No accessory muscle use. ABD: S, NT, ND, +BS. No rebound. No HSM. EXTR:  No c/c/e PSYCH: Normally interactive. Conversant.    Assessment and Plan: *** This visit occurred during the SARS-CoV-2 public health emergency.  Safety protocols were in place, including screening questions prior to the visit, additional usage of staff PPE, and extensive cleaning of exam room while observing appropriate contact time as indicated for disinfecting solutions.    Signed Lamar Blinks, MD

## 2019-07-13 NOTE — Progress Notes (Deleted)
[place] at [facility] [address], [place], [place] [address] [phone]  Date:  07/14/2019   Name:  Stephanie Parks   DOB:  01-Feb-1946   MRN:  [id number]  PCP:  [name], MD    Chief Complaint: No chief complaint on file.   History of Present Illness:  Stephanie Parks is a 74 y.o. very pleasant female patient who presents with the following:  Here today for short-term follow-up visit I saw her about 1 month ago with sciatica.  Treated with prednisone and Robaxin She continued to have discomfort, so we obtained spine films on April 27 IMPRESSION: No fracture or dislocation of the lumbar spine. There is moderate multilevel disc space height loss and osteophytosis of the lumbar spine, worst at L4-L5 and L5-S1. Moderate facet degenerative change of the lower lumbar levels. Degenerative findings are not significantly changed compared to prior examination dated 10/31/2014. Lumbar disc and neural foraminal pathology may be further evaluated by MRI if indicated by neurologically localizing signs and symptoms.  Patient is already established with Dr. Christella Noa with neurosurgery Patient was seen by Percell Miller on May 17 again with left leg pain, consistent with sciatica She was given Depo-Medrol, continue to use Robaxin and also given tramadol.  Finally, she was seen at the ER on May 22 with palpitations and tachycardia Work-up was reassuring, she was started on Toprol-XL 12.5 mg for symptoms  Patient Active Problem List   Diagnosis Date Noted  . Pre-diabetes 06/02/2019  . Seasonal and perennial allergic rhinitis 12/22/2018  . Heart palpitations 12/22/2018  . History of chest pain 07/20/2018  . Brain aneurysm 07/20/2018  . Bronchitis 04/23/2018  . Tendonitis, Achilles, right 12/08/2017  . Onychomycosis 12/08/2017  . Unilateral primary osteoarthritis, left knee 10/07/2017  . Ingrown toenail 08/10/2017  . Coughing  04/24/2017  . CAD (coronary artery disease) 03/01/2015  . Aneurysm ([place]) 03/01/2015  . Dizziness 03/01/2015  . Paresthesia of arm 03/01/2015  . Allergic rhinitis 03/01/2015  . Achalasia 09/28/2012  . CN (constipation) 09/28/2012  . Cephalalgia 08/24/2012  . HTN (hypertension) 04/19/2011  . Hyperlipidemia 04/19/2011  . GERD (gastroesophageal reflux disease) 04/19/2011  . Chest pain 04/04/2011    Past Medical History:  Diagnosis Date  . 3-vessel CAD 03/01/2015  . Achalasia 09/28/2012  . Allergic rhinitis 03/01/2015  . Anemia   . Aneurysm ([place]) 03/01/2015  . BP (high blood pressure) 03/01/2015  . Bronchitis 04/23/2018  . Cephalalgia 08/24/2012   Overview:  ICD-10 cut over    . Chest pain 04/04/2011  . CN (constipation) 09/28/2012  . Coughing 04/24/2017  . Decreased potassium in the blood 03/01/2015  . Diverticulosis   . Dizziness 03/01/2015  . GERD (gastroesophageal reflux disease)   . Hiatal hernia   . Hypercholesterolemia 03/01/2015  . Hyperlipidemia   . Hypertension   . Ingrown toenail 08/10/2017  . Inguinal hernia   . Migraine headache   . Onychomycosis 12/08/2017  . Paresthesia of arm 03/01/2015  . Schatzki's ring   . Tendonitis, Achilles, right 12/08/2017  . Unilateral primary osteoarthritis, left knee 10/07/2017    Past Surgical History:  Procedure Laterality Date  . ABDOMINAL HYSTERECTOMY    . BACK SURGERY     x 3  . CARPAL TUNNEL RELEASE     left wrist  . CERVICAL SPINE SURGERY    . HEMORRHOID SURGERY      Social History   Tobacco Use  . Smoking  status: Former Smoker    Packs/day: 0.50    Years: 20.00    Pack years: 10.00    Types: Cigarettes    Quit date: 02/19/1984    Years since quitting: 35.4  . Smokeless tobacco: Never Used  Substance Use Topics  . Alcohol use: No  . Drug use: No    Family History  Problem Relation Age of Onset  . Allergic rhinitis Sister   . Diabetes Sister   . Hypertension Sister   . Heart attack Father 38  . Heart disease  Father   . Stomach cancer Maternal Grandmother   . Heart disease Mother   . Colon cancer Neg Hx   . Angioedema Neg Hx   . Asthma Neg Hx   . Eczema Neg Hx   . Urticaria Neg Hx   . Immunodeficiency Neg Hx   . Breast cancer Neg Hx   . Migraines Neg Hx   . Headache Neg Hx     Allergies  Allergen Reactions  . Shellfish Allergy Hives and Swelling  . Ace Inhibitors Other (See Comments)    Pt cannot recall her reaction but tolerates arb   . Pitavastatin   . Topiramate     Heart race,   . Sulfa Antibiotics Other (See Comments) and Rash    Fine bumps    Medication list has been reviewed and updated.  Current Outpatient Medications on File Prior to Visit  Medication Sig Dispense Refill  . aspirin 81 MG tablet Take 81 mg by mouth daily.    . Azelastine HCl 0.15 % SOLN Place 1-2 sprays into both nostrils 2 (two) times daily. 30 mL 5  . Calcium Carbonate (CALCIUM 600 PO) Take 1 tablet by mouth daily.    . fluticasone (FLONASE) 50 MCG/ACT nasal spray USE 2 SPRAYS IN NOSTRIL(S) DAILY AS NEEDED. 16 g 9  . KLOR-CON M20 20 MEQ tablet TAKE 1 TABLET BY MOUTH EVERY DAY 90 tablet 1  . losartan-hydrochlorothiazide (HYZAAR) 50-12.5 MG tablet Take 1 tablet by mouth 2 (two) times daily.    . methocarbamol (ROBAXIN) 500 MG tablet Take 1 tablet (500 mg total) by mouth every 8 (eight) hours as needed for muscle spasms. 20 tablet 0  . metoprolol succinate (TOPROL-XL) 25 MG 24 hr tablet Take 0.5 tablets (12.5 mg total) by mouth daily. 30 tablet 0  . Multiple Vitamins-Minerals (MULTIVITAL) tablet Take by mouth.    . NONFORMULARY OR COMPOUNDED ITEM Allergen immunotherapy 2 each 1  . nortriptyline (PAMELOR) 25 MG capsule Take 25 mg by mouth daily.    . pantoprazole (PROTONIX) 40 MG tablet Take 1 tablet (40 mg total) by mouth daily. 30 tablet 0  . predniSONE (DELTASONE) 20 MG tablet Take 2 pills daily for 4 days, then 1 pill daily for 4 days 12 tablet 0  . rosuvastatin (CRESTOR) 20 MG tablet Take 20 mg by  mouth daily.    . traMADol (ULTRAM) 50 MG tablet 1 tab po q 6 hours prn pain 16 tablet 0   No current facility-administered medications on file prior to visit.    Review of Systems:  As per HPI- otherwise negative.   Physical Examination: There were no vitals filed for this visit. There were no vitals filed for this visit. There is no height or weight on file to calculate BMI. Ideal Body Weight:    GEN: no acute distress. HEENT: Atraumatic, Normocephalic.  Ears and Nose: No external deformity. CV: RRR, No M/G/R. No JVD. No thrill. No  extra heart sounds. PULM: CTA B, no wheezes, crackles, rhonchi. No retractions. No resp. distress. No accessory muscle use. ABD: S, NT, ND, +BS. No rebound. No HSM. EXTR: No c/c/e PSYCH: Normally interactive. Conversant.    Assessment and Plan: *** This visit occurred during the SARS-CoV-2 public health emergency.  Safety protocols were in place, including screening questions prior to the visit, additional usage of staff PPE, and extensive cleaning of exam room while observing appropriate contact time as indicated for disinfecting solutions.    Signed Lamar Blinks, MD

## 2019-07-14 ENCOUNTER — Ambulatory Visit: Payer: Medicare Other | Admitting: Family Medicine

## 2019-07-17 NOTE — Progress Notes (Signed)
[place] [facility] [address] [place], [place] [address] [phone]  Date:  07/21/2019   Name:  Stephanie Parks   DOB:  30-Dec-1945   MRN:  [id number]  PCP:  [name], MD    Chief Complaint: ER follow up and Leg Pain (left leg pain, sever, trouble walking)   History of Present Illness:  Stephanie Parks is a 74 y.o. very pleasant female patient who presents with the following:  Patient with history of hypertension, CAD, brain aneurysm, GERD, sleep apnea on CPAP.  I last saw her in April of this year with sciatica pain Here today for emergency room follow-up, she was seen in the [place] on May 22 with palpitations and tachycardia-her eval was reassuring, she was started on a low-dose of Toprol-XL and asked to follow-up with me In the ER she had a BMP which is reassuring, CBC normal except for minimal anemia, normal TSH She notes that she may still get tachycardia when her leg pain is more severe She is not having an irregular heart beat, just tachycardia.  She notes this tends to be worse when she is in pain  She is having an MRI later this month per Dr Christella Noa.  She saw him 2-3 weeks ago; I will request his notes  She continues to have a lot of leg pain mostly in her left leg, and may run down the left lateral leg to the knee.  She is currently not having a lot of back pain, her right leg is generally okay Tramadol makes her feel lightheaded and sleepy- otherwise does not help with her pain all that much  She is trying to get her MRI moved up but this did not work out   Colonoscopy completed 2020 Mammo up-to-date Covid series; she did both, does not have dates on her  Pneumonia vaccine- she declines  Tetanus- she declines   She stopped taking nortriptyline as it did not seem to make a difference to her.  She does not miss this medication-I advised that she can continue to stay off this  medication  07/05/2019  1   07/05/2019  Tramadol Hcl 50 MG Tablet  16.00  4 Ed Sag   [id number]   [place] ([id number])   0  20.00 MME  Medicare   [place]  10/19/2018  1   10/16/2018  Acetaminophen-Cod #3 Tablet  30.00  7 Je Cop   [id number]   [place] (7933)   0  19.29 MME  Medicare   [place]  05/14/2018  1   05/14/2018  Acetaminophen-Cod #3 Tablet  30.00  10 Je Cop   [id number]   [place] (7933)   0  13.50 MME        BP Readings from Last 3 Encounters:  07/21/19 126/72  07/10/19 138/63  07/05/19 (!) 143/67   Pulse Readings from Last 3 Encounters:  07/21/19 71  07/10/19 82  07/05/19 92    Patient Active Problem List   Diagnosis Date Noted   Pre-diabetes 06/02/2019   Seasonal and perennial allergic rhinitis 12/22/2018   Heart palpitations 12/22/2018   History of chest pain 07/20/2018   Brain aneurysm 07/20/2018   Bronchitis 04/23/2018   Tendonitis, Achilles, right 12/08/2017   Onychomycosis 12/08/2017   Unilateral primary osteoarthritis, left knee 10/07/2017   Ingrown toenail 08/10/2017   Coughing 04/24/2017   CAD (coronary artery disease) 03/01/2015   Aneurysm ([place]) 03/01/2015  Dizziness 03/01/2015   Paresthesia of arm 03/01/2015   Allergic rhinitis 03/01/2015   Achalasia 09/28/2012   CN (constipation) 09/28/2012   Cephalalgia 08/24/2012   HTN (hypertension) 04/19/2011   Hyperlipidemia 04/19/2011   GERD (gastroesophageal reflux disease) 04/19/2011   Chest pain 04/04/2011    Past Medical History:  Diagnosis Date   3-vessel CAD 03/01/2015   Achalasia 09/28/2012   Allergic rhinitis 03/01/2015   Anemia    Aneurysm ([place]) 03/01/2015   BP (high blood pressure) 03/01/2015   Bronchitis 04/23/2018   Cephalalgia 08/24/2012   Overview:  ICD-10 cut over     Chest pain 04/04/2011   CN (constipation) 09/28/2012   Coughing 04/24/2017   Decreased potassium in the blood 03/01/2015   Diverticulosis    Dizziness 03/01/2015   GERD (gastroesophageal reflux disease)    Hiatal hernia     Hypercholesterolemia 03/01/2015   Hyperlipidemia    Hypertension    Ingrown toenail 08/10/2017   Inguinal hernia    Migraine headache    Onychomycosis 12/08/2017   Paresthesia of arm 03/01/2015   Schatzki's ring    Tendonitis, Achilles, right 12/08/2017   Unilateral primary osteoarthritis, left knee 10/07/2017    Past Surgical History:  Procedure Laterality Date   ABDOMINAL HYSTERECTOMY     BACK SURGERY     x 3   CARPAL TUNNEL RELEASE     left wrist   CERVICAL SPINE SURGERY     HEMORRHOID SURGERY      Social History   Tobacco Use   Smoking status: Former Smoker    Packs/day: 0.50    Years: 20.00    Pack years: 10.00    Types: Cigarettes    Quit date: 02/19/1984    Years since quitting: 35.4   Smokeless tobacco: Never Used  Substance Use Topics   Alcohol use: No   Drug use: No    Family History  Problem Relation Age of Onset   Allergic rhinitis Sister    Diabetes Sister    Hypertension Sister    Heart attack Father 7   Heart disease Father    Stomach cancer Maternal Grandmother    Heart disease Mother    Colon cancer Neg Hx    Angioedema Neg Hx    Asthma Neg Hx    Eczema Neg Hx    Urticaria Neg Hx    Immunodeficiency Neg Hx    Breast cancer Neg Hx    Migraines Neg Hx    Headache Neg Hx     Allergies  Allergen Reactions   Shellfish Allergy Hives and Swelling   Ace Inhibitors Other (See Comments)    Pt cannot recall her reaction but tolerates arb    Pitavastatin    Topiramate     Heart race,    Sulfa Antibiotics Other (See Comments) and Rash    Fine bumps    Medication list has been reviewed and updated.  Current Outpatient Medications on File Prior to Visit  Medication Sig Dispense Refill   aspirin 81 MG tablet Take 81 mg by mouth daily.     Azelastine HCl 0.15 % SOLN Place 1-2 sprays into both nostrils 2 (two) times daily. 30 mL 5   Calcium Carbonate (CALCIUM 600 PO) Take 1 tablet by mouth daily.      fluticasone (FLONASE) 50 MCG/ACT nasal spray USE 2 SPRAYS IN NOSTRIL(S) DAILY AS NEEDED. 16 g 9   KLOR-CON M20 20 MEQ tablet TAKE 1 TABLET BY MOUTH EVERY DAY 90 tablet 1  losartan-hydrochlorothiazide (HYZAAR) 50-12.5 MG tablet Take 1 tablet by mouth 2 (two) times daily.     methocarbamol (ROBAXIN) 500 MG tablet Take 1 tablet (500 mg total) by mouth every 8 (eight) hours as needed for muscle spasms. 20 tablet 0   metoprolol succinate (TOPROL-XL) 25 MG 24 hr tablet Take 0.5 tablets (12.5 mg total) by mouth daily. 30 tablet 0   Multiple Vitamins-Minerals (MULTIVITAL) tablet Take by mouth.     NONFORMULARY OR COMPOUNDED ITEM Allergen immunotherapy 2 each 1   nortriptyline (PAMELOR) 25 MG capsule Take 25 mg by mouth daily.     pantoprazole (PROTONIX) 40 MG tablet TAKE 1 TABLET BY MOUTH DAILY. TAKE 30-60 MINUTES BEFORE BREAKFAST. 90 tablet 2   predniSONE (DELTASONE) 20 MG tablet Take 2 pills daily for 4 days, then 1 pill daily for 4 days 12 tablet 0   rosuvastatin (CRESTOR) 20 MG tablet Take 20 mg by mouth daily.     traMADol (ULTRAM) 50 MG tablet 1 tab po q 6 hours prn pain 16 tablet 0   No current facility-administered medications on file prior to visit.    Review of Systems:  As per HPI- otherwise negative. She notes numbness in her left lateral thigh, but no saddle anesthesia   Physical Examination: Vitals:   07/21/19 0928  BP: 126/72  Pulse: 71  Resp: 18  SpO2: 99%   Vitals:   07/21/19 0928  Weight: 211 lb (95.7 kg)  Height: 5\' 5"  (1.651 m)   Body mass index is 35.11 kg/m. Ideal Body Weight: Weight in (lb) to have BMI = 25: 149.9  GEN: no acute distress. HEENT: Atraumatic, Normocephalic.  Ears and Nose: No external deformity. CV: RRR, No M/G/R. No JVD. No thrill. No extra heart sounds. PULM: CTA B, no wheezes, crackles, rhonchi. No retractions. No resp. distress. No accessory muscle use. EXTR: No c/c/e PSYCH: Normally interactive. Conversant.  Walking slowly,  using a cane Well-healed midline scar over the lumbar spine from previous back surgery I am not able to reproduce her leg pain by pressing on buttock or leg   Assessment and Plan: Hospital discharge follow-up  Sciatica of left side - Plan: HYDROcodone-acetaminophen (NORCO/VICODIN) 5-325 MG tablet  Following up from recent ER stay with tachycardia and hypotension.  Suspect the symptoms were likely due to pain, her vitals look normal today and she will continue beta-blocker She is being seen by neurosurgery, will request records from Dr. Christella Noa.  An MRI is scheduled for later on this month.  Patient notes that she has received epidural steroid injections in the past which were helpful, this may be a good option for her again this time.  She is currently taking tramadol but notes that it does not relieve her pain.  I will prescribe a small supply of hydrocodone to try instead  This visit occurred during the SARS-CoV-2 public health emergency.  Safety protocols were in place, including screening questions prior to the visit, additional usage of staff PPE, and extensive cleaning of exam room while observing appropriate contact time as indicated for disinfecting solutions.    Signed Lamar Blinks, MD

## 2019-07-17 NOTE — Patient Instructions (Addendum)
It was great to see you again today- I am sorry that your leg is hurting you so much Please try the hydrocodone sparingly for pain- this can be addictive and can make you feel drowsy. I will request records from Dr Christella Noa.  I think that your increased BP and pulse are likely due to pain  Please keep me posted- assuming all is well we can plan to visit in 4-6 months

## 2019-07-20 ENCOUNTER — Other Ambulatory Visit: Payer: Self-pay | Admitting: Physician Assistant

## 2019-07-20 DIAGNOSIS — G4733 Obstructive sleep apnea (adult) (pediatric): Secondary | ICD-10-CM | POA: Diagnosis not present

## 2019-07-21 ENCOUNTER — Ambulatory Visit (INDEPENDENT_AMBULATORY_CARE_PROVIDER_SITE_OTHER): Payer: Medicare Other | Admitting: Family Medicine

## 2019-07-21 ENCOUNTER — Other Ambulatory Visit: Payer: Self-pay

## 2019-07-21 VITALS — BP 126/72 | HR 71 | Resp 18 | Ht 65.0 in | Wt 211.0 lb

## 2019-07-21 DIAGNOSIS — M5432 Sciatica, left side: Secondary | ICD-10-CM | POA: Diagnosis not present

## 2019-07-21 DIAGNOSIS — Z09 Encounter for follow-up examination after completed treatment for conditions other than malignant neoplasm: Secondary | ICD-10-CM

## 2019-07-21 MED ORDER — HYDROCODONE-ACETAMINOPHEN 5-325 MG PO TABS: 1.0000 | ORAL_TABLET | Freq: Three times a day (TID) | ORAL | 0 refills | Status: DC | PRN

## 2019-07-29 NOTE — Progress Notes (Signed)
Cardiology Office Note   Date:  07/30/2019   ID:  Stephanie Parks, DOB 31-Mar-1945, MRN [id number]  PCP:  [name], MD    No chief complaint on file.  HTN  Wt Readings from Last 3 Encounters:  07/30/19 215 lb 4.8 oz (97.7 kg)  07/21/19 211 lb (95.7 kg)  07/10/19 212 lb (96.2 kg)       History of Present Illness: Stephanie Parks is a 74 y.o. female  has had HTN and hyperlipidemia.  She has a h/o brain aneurysm.  She had a cath many years ago and no PCI was needed.    She has had palpitations, monitor in 2018 showed:  Normal sinus rhythm with occasional PACs and PVCs.  Occasional sinus tachycardia noted.  Patient's symptoms of fluttering did not correspond to an arrhythmia.  SHe has had atypical chest discomfort in the past.  She declined stress test as symptoms were mild and quite atypical including improving with exercise and belching.  Lovastatin was switched for Crestor in 2020.  Since the last visit, she has had some left back and left leg pain.   She has had rare chest pain and some heart racing. Sx are similar to what she had before.  They resolve with belching.   Had high BP in 5/21 and went to ER.   Past Medical History:  Diagnosis Date  . 3-vessel CAD 03/01/2015  . Achalasia 09/28/2012  . Allergic rhinitis 03/01/2015  . Anemia   . Aneurysm ([place]) 03/01/2015  . BP (high blood pressure) 03/01/2015  . Bronchitis 04/23/2018  . Cephalalgia 08/24/2012   Overview:  ICD-10 cut over    . Chest pain 04/04/2011  . CN (constipation) 09/28/2012  . Coughing 04/24/2017  . Decreased potassium in the blood 03/01/2015  . Diverticulosis   . Dizziness 03/01/2015  . GERD (gastroesophageal reflux disease)   . Hiatal hernia   . Hypercholesterolemia 03/01/2015  . Hyperlipidemia   . Hypertension   . Ingrown toenail 08/10/2017  . Inguinal hernia   . Migraine headache   . Onychomycosis 12/08/2017  . Paresthesia of arm 03/01/2015  . Schatzki's ring   . Tendonitis,  Achilles, right 12/08/2017  . Unilateral primary osteoarthritis, left knee 10/07/2017    Past Surgical History:  Procedure Laterality Date  . ABDOMINAL HYSTERECTOMY    . BACK SURGERY     x 3  . CARPAL TUNNEL RELEASE     left wrist  . CERVICAL SPINE SURGERY    . HEMORRHOID SURGERY       Current Outpatient Medications  Medication Sig Dispense Refill  . aspirin 81 MG tablet Take 81 mg by mouth daily.    . Azelastine HCl 0.15 % SOLN Place 1-2 sprays into both nostrils 2 (two) times daily. 30 mL 5  . Calcium Carbonate (CALCIUM 600 PO) Take 1 tablet by mouth daily.    . fluticasone (FLONASE) 50 MCG/ACT nasal spray USE 2 SPRAYS IN NOSTRIL(S) DAILY AS NEEDED. 16 g 9  . KLOR-CON M20 20 MEQ tablet TAKE 1 TABLET BY MOUTH EVERY DAY 90 tablet 1  . losartan-hydrochlorothiazide (HYZAAR) 50-12.5 MG tablet Take 1 tablet by mouth 2 (two) times daily.    . methocarbamol (ROBAXIN) 500 MG tablet Take 1 tablet (500 mg total) by mouth every 8 (eight) hours as needed for muscle spasms. 20 tablet 0  . metoprolol succinate (TOPROL-XL) 25 MG 24 hr tablet Take 0.5 tablets (12.5 mg total) by mouth daily. 30 tablet 0  .  Multiple Vitamins-Minerals (MULTIVITAL) tablet Take by mouth.    . NONFORMULARY OR COMPOUNDED ITEM Allergen immunotherapy 2 each 1  . pantoprazole (PROTONIX) 40 MG tablet TAKE 1 TABLET BY MOUTH DAILY. TAKE 30-60 MINUTES BEFORE BREAKFAST. 90 tablet 2  . rosuvastatin (CRESTOR) 20 MG tablet Take 20 mg by mouth daily.     No current facility-administered medications for this visit.    Allergies:   Shellfish allergy, Ace inhibitors, Pitavastatin, Topiramate, and Sulfa antibiotics    Social History:  The patient  reports that she quit smoking about 35 years ago. Her smoking use included cigarettes. She has a 10.00 pack-year smoking history. She has never used smokeless tobacco. She reports that she does not drink alcohol and does not use drugs.   Family History:  The patient's family history  includes Allergic rhinitis in her sister; Diabetes in her sister; Heart attack (age of onset: 29) in her father; Heart disease in her father and mother; Hypertension in her sister; Stomach cancer in her maternal grandmother.    ROS:  Please see the history of present illness.   Otherwise, review of systems are positive for left leg pain.   All other systems are reviewed and negative.    PHYSICAL EXAM: VS:  BP 126/66   Pulse 93   Ht 5\' 5"  (1.651 m)   Wt 215 lb 4.8 oz (97.7 kg)   SpO2 98%   BMI 35.83 kg/m  , BMI Body mass index is 35.83 kg/m. GEN: Well nourished, well developed, in no acute distress  HEENT: normal  Neck: no JVD, carotid bruits, or masses Cardiac: RRR; no murmurs, rubs, or gallops,no edema  Respiratory:  clear to auscultation bilaterally, normal work of breathing GI: soft, nontender, nondistended, + BS MS: no deformity or atrophy  Skin: warm and dry, no rash Neuro:  Strength and sensation are intact; normal strength in bilateral UE. Psych: euthymic mood, full affect   EKG:   The ekg ordered 5/22 demonstrates NSR, no ST changes   Recent Labs: 06/02/2019: ALT 14 07/10/2019: BUN 16; Creatinine, Ser 0.88; Hemoglobin 11.5; Platelets 380; Potassium 3.6; Sodium 139; TSH 1.988   Lipid Panel    Component Value Date/Time   CHOL 161 06/02/2019 1439   CHOL 176 07/28/2018 0756   TRIG 86.0 06/02/2019 1439   HDL 56.00 06/02/2019 1439   HDL 73 07/28/2018 0756   CHOLHDL 3 06/02/2019 1439   VLDL 17.2 06/02/2019 1439   LDLCALC 87 06/02/2019 1439   LDLCALC 93 07/28/2018 0756     Other studies Reviewed: Additional studies/ records that were reviewed today with results demonstrating: ER records reviewed.   ASSESSMENT AND PLAN:  1.   Chest pain: Still atypical.  Bothered more by back pain and leg pain.  MRI on the 17th of June.  Given DOE, will check BMet and BNP.   2.   HTN: No further spikes since May 2021.  Home BP readings were normal. Metoprolol 12.5 mg was  restarted.  3.   GERD: Treated with PPI. 4. Leg pain/Back pain: This is primary issue.   5. Hyperlipidemia: Crestor; LDL 87 in 4/21   Current medicines are reviewed at length with the patient today.  The patient concerns regarding her medicines were addressed.  The following changes have been made:  No change  Labs/ tests ordered today include:  No orders of the defined types were placed in this encounter.   Recommend 150 minutes/week of aerobic exercise Low fat, low carb, high fiber diet  recommended  Disposition:   FU in 1 year   Signed, Larae Grooms, MD  07/30/2019 3:58 PM    [address], [place], [place]  [address] Phone: [phone]; Fax: [phone]

## 2019-07-30 ENCOUNTER — Encounter: Payer: Self-pay | Admitting: Interventional Cardiology

## 2019-07-30 ENCOUNTER — Other Ambulatory Visit: Payer: Self-pay

## 2019-07-30 ENCOUNTER — Ambulatory Visit: Payer: Medicare Other | Admitting: Interventional Cardiology

## 2019-07-30 VITALS — BP 126/66 | HR 93 | Ht 65.0 in | Wt 215.3 lb

## 2019-07-30 DIAGNOSIS — E785 Hyperlipidemia, unspecified: Secondary | ICD-10-CM | POA: Diagnosis not present

## 2019-07-30 DIAGNOSIS — I2510 Atherosclerotic heart disease of native coronary artery without angina pectoris: Secondary | ICD-10-CM | POA: Diagnosis not present

## 2019-07-30 DIAGNOSIS — I10 Essential (primary) hypertension: Secondary | ICD-10-CM

## 2019-07-30 DIAGNOSIS — R0600 Dyspnea, unspecified: Secondary | ICD-10-CM

## 2019-07-30 DIAGNOSIS — R0609 Other forms of dyspnea: Secondary | ICD-10-CM

## 2019-07-30 MED ORDER — LOSARTAN POTASSIUM-HCTZ 50-12.5 MG PO TABS: 1.0000 | ORAL_TABLET | Freq: Two times a day (BID) | ORAL | 3 refills | Status: DC

## 2019-07-30 NOTE — Patient Instructions (Signed)
Medication Instructions:  Your physician recommends that you continue on your current medications as directed. Please refer to the Current Medication list given to you today.  *If you need a refill on your cardiac medications before your next appointment, please call your pharmacy*   Lab Work: TODAY: BMET, BNP  If you have labs (blood work) drawn today and your tests are completely normal, you will receive your results only by:  [place] (if you have MyChart) OR  A paper copy in the mail If you have any lab test that is abnormal or we need to change your treatment, we will call you to review the results.   Testing/Procedures: None   Follow-Up: At [facility], you and your health needs are our priority.  As part of our continuing mission to provide you with exceptional heart care, we have created designated Provider Care Teams.  These Care Teams include your primary Cardiologist (physician) and Advanced Practice Providers (APPs -  Physician Assistants and Nurse Practitioners) who all work together to provide you with the care you need, when you need it.  We recommend signing up for the patient portal called "MyChart".  Sign up information is provided on this After Visit Summary.  MyChart is used to connect with patients for Virtual Visits (Telemedicine).  Patients are able to view lab/test results, encounter notes, upcoming appointments, etc.  Non-urgent messages can be sent to your provider as well.   To learn more about what you can do with MyChart, go to [email or web address].    Your next appointment:   12 month(s)  The format for your next appointment:   In Person  Provider:   You may see Larae Grooms, MD or one of the following Advanced Practice Providers on your designated Care Team:    Melina Copa, PA-C  Ermalinda Barrios, PA-C    Other Instructions None

## 2019-07-31 LAB — BASIC METABOLIC PANEL
BUN/Creatinine Ratio: 16 (ref 12–28)
BUN: 14 mg/dL (ref 8–27)
CO2: 29 mmol/L (ref 20–29)
Calcium: 9.3 mg/dL (ref 8.7–10.3)
Chloride: 103 mmol/L (ref 96–106)
Creatinine, Ser: 0.9 mg/dL (ref 0.57–1.00)
GFR calc Af Amer: 73 mL/min/{1.73_m2} (ref >59)
GFR calc non Af Amer: 64 mL/min/{1.73_m2} (ref >59)
Glucose: 127 mg/dL — ABNORMAL HIGH (ref 65–99)
Potassium: 3.5 mmol/L (ref 3.5–5.2)
Sodium: 143 mmol/L (ref 134–144)

## 2019-07-31 LAB — PRO B NATRIURETIC PEPTIDE: NT-Pro BNP: 99 pg/mL (ref 0–301)

## 2019-08-03 ENCOUNTER — Telehealth: Payer: Self-pay | Admitting: Family Medicine

## 2019-08-03 DIAGNOSIS — M5432 Sciatica, left side: Secondary | ICD-10-CM

## 2019-08-03 MED ORDER — HYDROCODONE-ACETAMINOPHEN 5-325 MG PO TABS: 1.0000 | ORAL_TABLET | Freq: Three times a day (TID) | ORAL | 0 refills | Status: AC | PRN

## 2019-08-03 NOTE — Telephone Encounter (Signed)
Not on current medication list.   Requesting:Hydrocodone  Contract:none EEF:EOFH Last Visit:07/21/2019 Next Visit:01/19/2020 Last Refill:  Please Advise

## 2019-08-03 NOTE — Telephone Encounter (Signed)
Medication: HYDROcodone-acetaminophen (NORCO) 5-325 MG per tablet [[id number]] ENDED   Has the patient contacted their pharmacy? No. (If no, request that the patient contact the pharmacy for the refill.) (If yes, when and what did the pharmacy advise?)  Preferred Pharmacy (with phone number or street name):  [id number] [place], [place] [address] Phone:  [phone]  Fax:  [phone]       Agent: Please be advised that RX refills may take up to 3 business days. We ask that you follow-up with your pharmacy.

## 2019-08-05 ENCOUNTER — Ambulatory Visit
Admission: RE | Admit: 2019-08-05 | Discharge: 2019-08-05 | Disposition: A | Discharge status: Home / Self Care | Payer: Medicare Other | Source: Ambulatory Visit | Attending: Neurosurgery | Admitting: Neurosurgery

## 2019-08-05 ENCOUNTER — Other Ambulatory Visit: Payer: Self-pay

## 2019-08-05 DIAGNOSIS — M5442 Lumbago with sciatica, left side: Secondary | ICD-10-CM

## 2019-08-05 DIAGNOSIS — I671 Cerebral aneurysm, nonruptured: Secondary | ICD-10-CM

## 2019-08-05 DIAGNOSIS — M5126 Other intervertebral disc displacement, lumbar region: Secondary | ICD-10-CM | POA: Diagnosis not present

## 2019-08-05 MED ORDER — IOPAMIDOL (ISOVUE-370) INJECTION 76%
75.0000 mL | Freq: Once | INTRAVENOUS | Status: AC | PRN
  Administered 2019-08-05: 75 mL via INTRAVENOUS

## 2019-08-05 MED ORDER — GADOBENATE DIMEGLUMINE 529 MG/ML IV SOLN
20.0000 mL | Freq: Once | INTRAVENOUS | Status: AC | PRN
  Administered 2019-08-05: 20 mL via INTRAVENOUS

## 2019-08-10 DIAGNOSIS — I10 Essential (primary) hypertension: Secondary | ICD-10-CM | POA: Diagnosis not present

## 2019-08-10 DIAGNOSIS — M4726 Other spondylosis with radiculopathy, lumbar region: Secondary | ICD-10-CM | POA: Diagnosis not present

## 2019-08-11 ENCOUNTER — Other Ambulatory Visit: Payer: Medicare Other

## 2019-08-12 ENCOUNTER — Other Ambulatory Visit: Payer: Self-pay | Admitting: *Deleted

## 2019-08-12 NOTE — Patient Outreach (Addendum)
[facility]) Care Management  08/12/2019  Stephanie Parks 1945/07/09 [id number]   Incoming call from Stephanie Parks reporting 2 problems she wants to discuss:  1)Lower BP than usual today (99/50). Slight feeling of whooziness. Taking lisinopril/HCTZ 50/12.5 and metoprolol succinate 12.5 mg. Her pressure has been difficult to control and get down to a desired range.  PLAN: Advised to increase her fluid intake and continue to monitor her BP. Be very careful changing position especially to standing. Stay in one place for a minute to allow your body to adjust to your upright position before walking as you may be at a higher risk for falling.  2)Uncontrolled LLE pain originating from low back. Pain level 9/10. Hydrocodone/APAP does not help it just makes her sleepy. Cannot get her steroid injection for about another month.  PLAN: Called Dr. Maryjean Ka to request consideration of Rx: gabapentin. Left message on assistant voice mail with return call request.  I will call [name] back on Monday to follow up on her BP and pain.  [name]. [name], MSN, GNP-BC Gerontological Nurse Practitioner [facility] Care Management [phone]   Addendum: 08/13/19 - Call back from Dr. Maryjean Ka office stating he would not prescribe as he has not seen pt in 3 years, suggested contacting primary care. Dr. Arlyn Dunning office called and left a message requesting Rx: Gabapentin for [name].  [name]. [name], MSN, [facility] Gerontological Nurse Practitioner [facility] Care Management [phone]

## 2019-08-13 ENCOUNTER — Telehealth: Payer: Self-pay | Admitting: Family Medicine

## 2019-08-13 NOTE — Telephone Encounter (Signed)
Please advise 

## 2019-08-13 NOTE — Telephone Encounter (Signed)
Caller: Bradd Canary, NP Call Back # [phone]   Calling in reference to patient and her back pain, patient not scheduled to get steroid injection until later next month. NP Bradd Canary  would like Dr Lorelei Pont to write a script for gabapentin to carry patient to her appointment for steroid shot.   Please Advise

## 2019-08-16 ENCOUNTER — Other Ambulatory Visit: Payer: Self-pay | Admitting: *Deleted

## 2019-08-16 MED ORDER — GABAPENTIN 300 MG PO CAPS: 300.0000 mg | ORAL_CAPSULE | Freq: Every day | ORAL | 1 refills | Status: DC

## 2019-08-16 NOTE — Addendum Note (Signed)
Addended by: Lamar Blinks C on: 08/16/2019 08:30 PM   Modules accepted: Orders

## 2019-08-16 NOTE — Patient Outreach (Signed)
[place] [facility]) Care Management  08/16/2019  MANA HABERL Jan 21, 1946 [id number]  Telephone follow up call regarding pain.  [name] states she has not heard back from Dr. Edilia Bo regarding the possibility of taking gabapentin for her spine/leg pain.  Chart review reveals that Dr. Edilia Bo did send Mrs. Stephanie Parks a message on Friday, but pt had not seen it. Dr. Edilia Bo says she would prescribe it if pt would like to try it. It seems she may have had a trial of this in the past but I am unable to see that.  Today, [name] is also reporting sxs of GERD. Some mid to left sided CP, lots of belching. She is taking pantoprazole 40 mg daily and has taken this bid at times. She admits she had been eating foods that can be gas forming also. She has not been diaphoretic, SOB, or nauseated.  Also discussed that some PT may be helpful for her back and leg pain. Suggests she asks Dr. Edilia Bo what she thinks about this.  PLAN: Pt to message Dr. Edilia Bo back that she would like to trial the gabapentin. Also ask about PT.  Avoid gas forming foods: onions, broccoli, cabbage, cauliflower...  NP to call pt back at the end of July for follow up. Encouraged to call me if any needs come up between now and then.  [name]. [name], MSN, [facility] Gerontological Nurse Practitioner [facility] Care Management [phone]

## 2019-08-19 DIAGNOSIS — G4733 Obstructive sleep apnea (adult) (pediatric): Secondary | ICD-10-CM | POA: Diagnosis not present

## 2019-08-25 ENCOUNTER — Telehealth: Payer: Self-pay | Admitting: Family Medicine

## 2019-08-25 MED ORDER — GABAPENTIN 300 MG PO CAPS: 300.0000 mg | ORAL_CAPSULE | Freq: Three times a day (TID) | ORAL | 1 refills | Status: DC

## 2019-08-25 NOTE — Telephone Encounter (Signed)
Called pt and LMOM- can increase gabapentin to 300 BID, if after a week still having pain increase to TID

## 2019-08-25 NOTE — Telephone Encounter (Signed)
Caller: Nanea Call back phone number: [phone]  Patient is wondering if you could increase the dosage on Gabapentin  Currently taking 300 mg. Patient states she still has having pain.  Please advise  [id number] [place], [place] - [address], [place] [address]  Phone:  [phone] Fax:  [phone]

## 2019-09-06 ENCOUNTER — Telehealth: Payer: Self-pay

## 2019-09-06 MED ORDER — METOPROLOL SUCCINATE ER 25 MG PO TB24: 12.5000 mg | ORAL_TABLET | Freq: Every day | ORAL | 1 refills | Status: DC

## 2019-09-06 NOTE — Telephone Encounter (Signed)
Medication refilled

## 2019-09-06 NOTE — Telephone Encounter (Signed)
Pt states she is completely out of her Metoprolol and refill will not be ready until Wed of this week.  [facility].

## 2019-09-11 ENCOUNTER — Other Ambulatory Visit: Payer: Self-pay

## 2019-09-11 ENCOUNTER — Emergency Department (HOSPITAL_BASED_OUTPATIENT_CLINIC_OR_DEPARTMENT_OTHER)
Admission: EM | Admit: 2019-09-11 | Discharge: 2019-09-11 | Disposition: A | Discharge status: Home / Self Care | Payer: Medicare Other | Attending: Emergency Medicine | Admitting: Emergency Medicine

## 2019-09-11 ENCOUNTER — Emergency Department (HOSPITAL_BASED_OUTPATIENT_CLINIC_OR_DEPARTMENT_OTHER): Payer: Medicare Other

## 2019-09-11 ENCOUNTER — Encounter (HOSPITAL_BASED_OUTPATIENT_CLINIC_OR_DEPARTMENT_OTHER): Payer: Self-pay | Admitting: Emergency Medicine

## 2019-09-11 DIAGNOSIS — G44209 Tension-type headache, unspecified, not intractable: Secondary | ICD-10-CM | POA: Insufficient documentation

## 2019-09-11 DIAGNOSIS — Z8739 Personal history of other diseases of the musculoskeletal system and connective tissue: Secondary | ICD-10-CM | POA: Insufficient documentation

## 2019-09-11 DIAGNOSIS — I2510 Atherosclerotic heart disease of native coronary artery without angina pectoris: Secondary | ICD-10-CM | POA: Diagnosis not present

## 2019-09-11 DIAGNOSIS — Z87891 Personal history of nicotine dependence: Secondary | ICD-10-CM | POA: Insufficient documentation

## 2019-09-11 DIAGNOSIS — M5412 Radiculopathy, cervical region: Secondary | ICD-10-CM | POA: Insufficient documentation

## 2019-09-11 DIAGNOSIS — K219 Gastro-esophageal reflux disease without esophagitis: Secondary | ICD-10-CM | POA: Insufficient documentation

## 2019-09-11 DIAGNOSIS — R519 Headache, unspecified: Secondary | ICD-10-CM | POA: Diagnosis present

## 2019-09-11 DIAGNOSIS — M5432 Sciatica, left side: Secondary | ICD-10-CM

## 2019-09-11 DIAGNOSIS — I720 Aneurysm of carotid artery: Secondary | ICD-10-CM | POA: Diagnosis not present

## 2019-09-11 DIAGNOSIS — E7800 Pure hypercholesterolemia, unspecified: Secondary | ICD-10-CM | POA: Diagnosis not present

## 2019-09-11 DIAGNOSIS — R202 Paresthesia of skin: Secondary | ICD-10-CM | POA: Insufficient documentation

## 2019-09-11 DIAGNOSIS — E785 Hyperlipidemia, unspecified: Secondary | ICD-10-CM | POA: Insufficient documentation

## 2019-09-11 DIAGNOSIS — I10 Essential (primary) hypertension: Secondary | ICD-10-CM | POA: Insufficient documentation

## 2019-09-11 DIAGNOSIS — K625 Hemorrhage of anus and rectum: Secondary | ICD-10-CM | POA: Diagnosis not present

## 2019-09-11 DIAGNOSIS — M79642 Pain in left hand: Secondary | ICD-10-CM | POA: Insufficient documentation

## 2019-09-11 LAB — CBC
HCT: 34.2 % — ABNORMAL LOW (ref 36.0–46.0)
Hemoglobin: 10.9 g/dL — ABNORMAL LOW (ref 12.0–15.0)
MCH: 28.7 pg (ref 26.0–34.0)
MCHC: 31.9 g/dL (ref 30.0–36.0)
MCV: 90 fL (ref 80.0–100.0)
Platelets: 304 10*3/uL (ref 150–400)
RBC: 3.8 MIL/uL — ABNORMAL LOW (ref 3.87–5.11)
RDW: 15 % (ref 11.5–15.5)
WBC: 7.3 10*3/uL (ref 4.0–10.5)
nRBC: 0 % (ref 0.0–0.2)

## 2019-09-11 LAB — COMPREHENSIVE METABOLIC PANEL
ALT: 16 U/L (ref 0–44)
AST: 23 U/L (ref 15–41)
Albumin: 3.7 g/dL (ref 3.5–5.0)
Alkaline Phosphatase: 62 U/L (ref 38–126)
Anion gap: 12 (ref 5–15)
BUN: 15 mg/dL (ref 8–23)
CO2: 27 mmol/L (ref 22–32)
Calcium: 9 mg/dL (ref 8.9–10.3)
Chloride: 100 mmol/L (ref 98–111)
Creatinine, Ser: 0.89 mg/dL (ref 0.44–1.00)
GFR calc Af Amer: >60 mL/min (ref >60)
GFR calc non Af Amer: >60 mL/min (ref >60)
Glucose, Bld: 108 mg/dL — ABNORMAL HIGH (ref 70–99)
Potassium: 3.1 mmol/L — ABNORMAL LOW (ref 3.5–5.1)
Sodium: 139 mmol/L (ref 135–145)
Total Bilirubin: 0.6 mg/dL (ref 0.3–1.2)
Total Protein: 7 g/dL (ref 6.5–8.1)

## 2019-09-11 LAB — OCCULT BLOOD X 1 CARD TO LAB, STOOL: Fecal Occult Bld: NEGATIVE

## 2019-09-11 MED ORDER — HYDROCODONE-ACETAMINOPHEN 5-325 MG PO TABS
1.0000 | ORAL_TABLET | ORAL | Status: AC
  Administered 2019-09-11: 1 via ORAL
  Filled 2019-09-11: qty 1

## 2019-09-11 MED ORDER — METHOCARBAMOL 500 MG PO TABS: 500.0000 mg | ORAL_TABLET | Freq: Three times a day (TID) | ORAL | 0 refills | Status: DC | PRN

## 2019-09-11 MED ORDER — LIDOCAINE 1.8 % EX PTCH: 1.0000 | MEDICATED_PATCH | Freq: Every day | CUTANEOUS | 1 refills | Status: DC

## 2019-09-11 NOTE — Discharge Instructions (Addendum)
Continue to take over the counter pain medications.     Take the muscle relaxant and the topical pain patch to help with the pain

## 2019-09-11 NOTE — ED Notes (Signed)
Patient transported to CT 

## 2019-09-11 NOTE — ED Notes (Signed)
Pt discharged to home. Discharge instructions have been discussed with patient and/or family members. Pt verbally acknowledges understanding d/c instructions, and endorses comprehension to checkout at registration before leaving.  °

## 2019-09-11 NOTE — ED Triage Notes (Signed)
Pt c/o headache, L arm pain, mouth tingling, dark stools since yesterday.

## 2019-09-11 NOTE — ED Provider Notes (Signed)
[facility] EMERGENCY DEPARTMENT Provider Note   CSN: [id number] Arrival date & time: 09/11/19  1344     History Chief Complaint  Patient presents with  . Headache  . Rectal Bleeding    Stephanie Parks is a 74 y.o. female.  HPI   Patient presented to the emergency room for evaluation of headache and arm pain.  Patient states she started having headache several days ago.  Sharp pain more towards the back of her head.  She also started to experience pain in her arm associated with tingling in her arm and tingling around her mouth.  Patient was concerned that she might be having a stroke.  She is not having any issues with her speech.  No trouble with her vision.  No difficulty with her balance or coordination.  Patient also noticed an episode of dark stool yesterday.  This concerned her as she has never had this before.  She denies any vomiting.  No hematemesis.  Patient does have history of lumbar radiculopathy.  She is scheduled to have an injection this coming week.  Patient states she has been having a lot of difficulty with discomfort on her left side mostly in her leg but it has moved up onto the entire left side since her MRI.  Past Medical History:  Diagnosis Date  . 3-vessel CAD 03/01/2015  . Achalasia 09/28/2012  . Allergic rhinitis 03/01/2015  . Anemia   . Aneurysm ([place]) 03/01/2015  . BP (high blood pressure) 03/01/2015  . Bronchitis 04/23/2018  . Cephalalgia 08/24/2012   Overview:  ICD-10 cut over    . Chest pain 04/04/2011  . CN (constipation) 09/28/2012  . Coughing 04/24/2017  . Decreased potassium in the blood 03/01/2015  . Diverticulosis   . Dizziness 03/01/2015  . GERD (gastroesophageal reflux disease)   . Hiatal hernia   . Hypercholesterolemia 03/01/2015  . Hyperlipidemia   . Hypertension   . Ingrown toenail 08/10/2017  . Inguinal hernia   . Migraine headache   . Onychomycosis 12/08/2017  . Paresthesia of arm 03/01/2015  . Schatzki's ring   . Tendonitis,  Achilles, right 12/08/2017  . Unilateral primary osteoarthritis, left knee 10/07/2017    Patient Active Problem List   Diagnosis Date Noted  . Pre-diabetes 06/02/2019  . Seasonal and perennial allergic rhinitis 12/22/2018  . Heart palpitations 12/22/2018  . History of chest pain 07/20/2018  . Brain aneurysm 07/20/2018  . Bronchitis 04/23/2018  . Tendonitis, Achilles, right 12/08/2017  . Onychomycosis 12/08/2017  . Unilateral primary osteoarthritis, left knee 10/07/2017  . Ingrown toenail 08/10/2017  . Coughing 04/24/2017  . CAD (coronary artery disease) 03/01/2015  . Aneurysm ([place]) 03/01/2015  . Dizziness 03/01/2015  . Paresthesia of arm 03/01/2015  . Allergic rhinitis 03/01/2015  . Achalasia 09/28/2012  . CN (constipation) 09/28/2012  . Cephalalgia 08/24/2012  . HTN (hypertension) 04/19/2011  . Hyperlipidemia 04/19/2011  . GERD (gastroesophageal reflux disease) 04/19/2011  . Chest pain 04/04/2011    Past Surgical History:  Procedure Laterality Date  . ABDOMINAL HYSTERECTOMY    . BACK SURGERY     x 3  . CARPAL TUNNEL RELEASE     left wrist  . CERVICAL SPINE SURGERY    . HEMORRHOID SURGERY       OB History   No obstetric history on file.     Family History  Problem Relation Age of Onset  . Allergic rhinitis Sister   . Diabetes Sister   . Hypertension Sister   .  Heart attack Father 10  . Heart disease Father   . Stomach cancer Maternal Grandmother   . Heart disease Mother   . Colon cancer Neg Hx   . Angioedema Neg Hx   . Asthma Neg Hx   . Eczema Neg Hx   . Urticaria Neg Hx   . Immunodeficiency Neg Hx   . Breast cancer Neg Hx   . Migraines Neg Hx   . Headache Neg Hx     Social History   Tobacco Use  . Smoking status: Former Smoker    Packs/day: 0.50    Years: 20.00    Pack years: 10.00    Types: Cigarettes    Quit date: 02/19/1984    Years since quitting: 35.5  . Smokeless tobacco: Never Used  Vaping Use  . Vaping Use: Never used  Substance  Use Topics  . Alcohol use: No  . Drug use: No    Home Medications Prior to Admission medications   Medication Sig Start Date End Date Taking? Authorizing Provider  aspirin 81 MG tablet Take 81 mg by mouth daily.    [provider]  Azelastine HCl 0.15 % SOLN Place 1-2 sprays into both nostrils 2 (two) times daily. 12/22/18   Dara Hoyer, FNP  Calcium Carbonate (CALCIUM 600 PO) Take 1 tablet by mouth daily.    [provider]  fluticasone (FLONASE) 50 MCG/ACT nasal spray USE 2 SPRAYS IN NOSTRIL(S) DAILY AS NEEDED. 12/22/18   Ambs, Kathrine Cords, FNP  gabapentin (NEURONTIN) 300 MG capsule Take 1 capsule (300 mg total) by mouth 3 (three) times daily. Start with BID and increase to TID if needed for pain 08/25/19   Copland, Gay Filler, MD  KLOR-CON M20 20 MEQ tablet TAKE 1 TABLET BY MOUTH EVERY DAY 05/25/19   Copland, Gay Filler, MD  Lidocaine 1.8 % PTCH Apply 1 patch topically daily. 09/11/19   Dorie Rank, MD  losartan-hydrochlorothiazide (HYZAAR) 50-12.5 MG tablet Take 1 tablet by mouth 2 (two) times daily. 07/30/19   Jettie Booze, MD  methocarbamol (ROBAXIN) 500 MG tablet Take 1 tablet (500 mg total) by mouth every 8 (eight) hours as needed for muscle spasms. 09/11/19   Dorie Rank, MD  metoprolol succinate (TOPROL-XL) 25 MG 24 hr tablet Take 0.5 tablets (12.5 mg total) by mouth daily. 09/06/19   Copland, Gay Filler, MD  Multiple Vitamins-Minerals (MULTIVITAL) tablet Take by mouth.    [provider]  NONFORMULARY OR COMPOUNDED ITEM Allergen immunotherapy 03/01/15   Leda Roys, MD  pantoprazole (PROTONIX) 40 MG tablet TAKE 1 TABLET BY MOUTH DAILY. TAKE 30-60 MINUTES BEFORE BREAKFAST. 07/20/19   Esterwood, Amy S, PA-C  rosuvastatin (CRESTOR) 20 MG tablet Take 20 mg by mouth daily.    [provider]    Allergies    Shellfish allergy, Ace inhibitors, Pitavastatin, Topiramate, and Sulfa antibiotics  Review of Systems   Review of Systems  All other systems reviewed and  are negative.   Physical Exam Updated Vital Signs BP (!) 124/64 (BP Location: Right Arm)   Pulse 70   Temp 98.5 F (36.9 C) (Oral)   Resp 16   Ht 1.651 m (5\' 5" )   Wt (!) 98.4 kg   SpO2 99%   BMI 36.11 kg/m   Physical Exam Vitals and nursing note reviewed.  Constitutional:      General: She is not in acute distress.    Appearance: She is well-developed.  HENT:     Head: Normocephalic and  atraumatic.     Right Ear: External ear normal.     Left Ear: External ear normal.  Eyes:     General: No scleral icterus.       Right eye: No discharge.        Left eye: No discharge.     Conjunctiva/sclera: Conjunctivae normal.  Neck:     Trachea: No tracheal deviation.  Cardiovascular:     Rate and Rhythm: Normal rate and regular rhythm.  Pulmonary:     Effort: Pulmonary effort is normal. No respiratory distress.     Breath sounds: Normal breath sounds. No stridor. No wheezing or rales.  Abdominal:     General: Bowel sounds are normal. There is no distension.     Palpations: Abdomen is soft.     Tenderness: There is no abdominal tenderness. There is no guarding or rebound.  Genitourinary:    Comments: Brown stool Musculoskeletal:        General: No tenderness.     Cervical back: Neck supple.     Comments: No deformity or swelling noted left arm, full range of motion  Skin:    General: Skin is warm and dry.     Findings: No rash.  Neurological:     Mental Status: She is alert and oriented to person, place, and time.     Cranial Nerves: No cranial nerve deficit (No facial droop, extraocular movements intact, tongue midline ).     Sensory: No sensory deficit.     Motor: No abnormal muscle tone or seizure activity.     Coordination: Coordination normal.     Comments: No pronator drift bilateral upper extrem, able to hold both legs off bed for 5 seconds, sensation intact in all extremities, no visual field cuts, no left or right sided neglect, normal finger-nose exam bilaterally,  no nystagmus noted      ED Results / Procedures / Treatments   Labs (all labs ordered are listed, but only abnormal results are displayed) Labs Reviewed  COMPREHENSIVE METABOLIC PANEL - Abnormal; Notable for the following components:      Result Value   Potassium 3.1 (*)    Glucose, Bld 108 (*)    All other components within normal limits  CBC - Abnormal; Notable for the following components:   RBC 3.80 (*)    Hemoglobin 10.9 (*)    HCT 34.2 (*)    All other components within normal limits  OCCULT BLOOD X 1 CARD TO LAB, STOOL    EKG EKG Interpretation  Date/Time:  Saturday September 11 2019 14:00:03 EDT Ventricular Rate:  65 PR Interval:  200 QRS Duration: 88 QT Interval:  390 QTC Calculation: 405 R Axis:   30 Text Interpretation: Normal sinus rhythm Low voltage QRS Cannot rule out Anterior infarct , age undetermined Abnormal ECG No significant change since last tracing Confirmed by Dorie Rank [phone]) on 09/11/2019 6:05:29 PM   Radiology CT Head Wo Contrast  Result Date: 09/11/2019 CLINICAL DATA:  Headache and dizziness, history of migraines hypertension EXAM: CT HEAD WITHOUT CONTRAST TECHNIQUE: Contiguous axial images were obtained from the base of the skull through the vertex without intravenous contrast. COMPARISON:  CT and CT angiography 08/05/2019 FINDINGS: Brain: No evidence of acute infarction, hemorrhage, hydrocephalus, extra-axial collection or mass lesion/mass effect. Symmetric prominence of the ventricles, cisterns and sulci compatible with parenchymal volume loss. Patchy areas of white matter hypoattenuation are most compatible with chronic microvascular angiopathy. Stable calcification in the left middle cranial fossa. Vascular:  Atherosclerotic calcification of the carotid siphons. No hyperdense vessel. Known right paraophthalmic ICA aneurysm not well visualized. Skull: No calvarial fracture or suspicious osseous lesion. No scalp swelling or hematoma. Sinuses/Orbits:  Paranasal sinuses and mastoid air cells are predominantly clear. Included orbital structures are unremarkable. Other: None IMPRESSION: 1. No acute intracranial abnormality. 2. Stable parenchymal volume loss and chronic microvascular angiopathy. 3. Known right paraophthalmic ICA aneurysm not well visualized on this unenhanced CT. Electronically Signed   By: Lovena Le M.D.   On: 09/11/2019 17:57    Procedures Procedures (including critical care time)  Medications Ordered in ED Medications  HYDROcodone-acetaminophen (NORCO/VICODIN) 5-325 MG per tablet 1 tablet (1 tablet Oral Given 09/11/19 1658)    ED Course  I have reviewed the triage vital signs and the nursing notes.  Pertinent labs & imaging results that were available during my care of the patient were reviewed by me and considered in my medical decision making (see chart for details).  Clinical Course as of Sep 10 1825  Sat Sep 11, 2019  1631 Hemoglobin similar to previous values   [JK]  1631 Previous hemoglobin is 11.5   [JK]  1631 Electrolyte panel shows mild decrease in potassium at 3.1   [JK]    Clinical Course User Index [JK] Dorie Rank, MD   MDM Rules/Calculators/A&P                          No signs of rectal bleeding.  Fecal occult negative.  Hgb normal.  CT scan without acute findings.  No focal findings on neuro exam.  Sx may be related to cervical radiculopathy with her left arm pain.   Will dc home with muscle relaxant, lidocaine patch.  Pt has been seeing Dr Cyndy Freeze as an ouptatient for her lower back.  Suggested she speak to him about these sx.  May end up needing an MRI  Final Clinical Impression(s) / ED Diagnoses Final diagnoses:  Tension headache  Cervical radiculitis    Rx / DC Orders ED Discharge Orders         Ordered    methocarbamol (ROBAXIN) 500 MG tablet  Every 8 hours PRN     Discontinue  Reprint     09/11/19 1818    Lidocaine 1.8 % PTCH  Daily     Discontinue  Reprint     09/11/19 1818            Dorie Rank, MD 09/11/19 1827

## 2019-09-13 ENCOUNTER — Other Ambulatory Visit: Payer: Self-pay | Admitting: *Deleted

## 2019-09-13 DIAGNOSIS — M5416 Radiculopathy, lumbar region: Secondary | ICD-10-CM | POA: Diagnosis not present

## 2019-09-13 NOTE — Patient Outreach (Signed)
[facility]) Care Management  09/13/2019  Stephanie Parks 16-May-1945 [id number]  Telephone outreach.  Stephanie Parks went to the ED on Saturday with complaints of HA, arm pain and tingling in her face. No significant findings.  Today she reports she had her epidural pain injection and her pain in general is diminishing.  She says her potassium was low 3.1.  She reports she stopped the gabapentin all together because she starting having a lot of side effects, confusion, walked into a wall.  Next OV with Dr. Edilia Bo is not until Dec.   PLAN:  Advised pt to call and make an appt with Dr. Edilia Bo. Suggest she have an every other month appt until she feels her multiple issues are managed.  Request: Dr. Edilia Bo would you kindly check a B12 level on [name]?   Advised pt to take an extra K[place] Con 20 meq for the next 3 days.  Reconsider restarting gabapentin at original 300 mg hs dose as this did have some benefit without the side effects.  NP to call in one month for follow up.  [name]. [name], MSN, [facility] Gerontological Nurse Practitioner [facility] Care Management [phone]

## 2019-09-15 ENCOUNTER — Telehealth: Payer: Self-pay | Admitting: Internal Medicine

## 2019-09-15 NOTE — Telephone Encounter (Signed)
Would have her increase pantoprazole back to 40 mg twice daily AC for 2 to 3 weeks and then if improving go back to once daily She can resume Carafate 1 g 3 times daily before meals and at bedtime on an as-needed basis.  It should be noted that she felt this may have caused constipation last time and so I would use this only for a brief period if it is helpful

## 2019-09-15 NOTE — Telephone Encounter (Signed)
Patient states she is having a "bad odor from my body and bowels." Reports increase in belching and gas x 2 weeks. Diarrhea started yesterday with 6 stools today. Has not been on any antibiotics. She did stop her Gabapentin 2 weeks ago due to headaches and balance issues. She is taking Protonix 40 mg daily. She had some "left over" Carafate pills and has taken 2 of those today. Wants to know if Dr. Hilarie Fredrickson would want her to restart her Carafate. Please, advise.

## 2019-09-16 NOTE — Telephone Encounter (Signed)
Pt wants to notify Rollene Fare that her stools this morning were black. Pt would like to further discuss this with [facility]

## 2019-09-16 NOTE — Telephone Encounter (Signed)
Left a message for patient to call back. 

## 2019-09-17 NOTE — Telephone Encounter (Signed)
Sorry Dr Hilarie Fredrickson disregard I sent to you in error

## 2019-09-19 DIAGNOSIS — G4733 Obstructive sleep apnea (adult) (pediatric): Secondary | ICD-10-CM | POA: Diagnosis not present

## 2019-09-20 NOTE — Telephone Encounter (Signed)
Spoke with pt and she is aware of Dr. Pyrtle's recommendations. 

## 2019-09-27 ENCOUNTER — Encounter: Payer: Self-pay | Admitting: Family Medicine

## 2019-09-27 ENCOUNTER — Other Ambulatory Visit: Payer: Self-pay

## 2019-09-27 ENCOUNTER — Ambulatory Visit (INDEPENDENT_AMBULATORY_CARE_PROVIDER_SITE_OTHER): Payer: Medicare Other | Admitting: Family Medicine

## 2019-09-27 VITALS — BP 142/78 | HR 85 | Temp 98.5°F | Resp 18 | Ht 65.0 in | Wt 209.0 lb

## 2019-09-27 DIAGNOSIS — H5789 Other specified disorders of eye and adnexa: Secondary | ICD-10-CM

## 2019-09-27 DIAGNOSIS — R519 Headache, unspecified: Secondary | ICD-10-CM

## 2019-09-27 DIAGNOSIS — I10 Essential (primary) hypertension: Secondary | ICD-10-CM | POA: Diagnosis not present

## 2019-09-27 MED ORDER — MOXIFLOXACIN HCL (2X DAY) 0.5 % OP SOLN: 1.0000 [drp] | Freq: Three times a day (TID) | OPHTHALMIC | 0 refills | Status: DC

## 2019-09-27 MED ORDER — LOSARTAN POTASSIUM-HCTZ 100-25 MG PO TABS: 1.0000 | ORAL_TABLET | Freq: Every day | ORAL | 3 refills | Status: DC

## 2019-09-27 NOTE — Patient Instructions (Signed)
Good to see you today-  I am sorry that you are not feeling well! We will try an antibiotic eye drop for your eyes I changed your BP med to a stronger dose you can take just once a day Also we will get labs today to help rule out temporal arteritis as the cause of your headaches

## 2019-09-27 NOTE — Progress Notes (Addendum)
[facility] [place], [place], [place] [address] [phone]  Date:  09/27/2019   Name:  Stephanie Parks   DOB:  [birth year]   MRN:  [id number]  PCP:  [name], MD    Chief Complaint: Facial Swelling (bilateral eye swelling, slight blurry vision pain on side of head since this morning)   History of Present Illness:  Stephanie Parks is a 74 y.o. very pleasant female patient who presents with the following:  Patient most recently saw in June for ER follow-up History of hypertension, CAD, brain aneurysm, GERD, sleep apnea on CPAP  Today she notes that "my eyes are swollen" bilaterally, and she has left sided HA which have been present for a year. She has been trying to figure out his head pain, she has seen her neurosurgeon and has been followed with neurology coming up She has noted some apparent swelling around her eyes since this am She had it once last week as well  She wears glasses only, no contacts She sometimes feels like she needs to blink her eyes to clear them out.  They were also a bit sticky this am with some mild discharge No fever   She had a head CT angio in June which was normal. Her neurosurgeon does not think her pain is anything to do with the aneurysm IMPRESSION: 1. Unchanged 5 mm right paraophthalmic ICA aneurysm. 2. Otherwise unremarkable head CTA.  She is taking sucralfate per Dr Hilarie Fredrickson She has plans to have her eyes checked and also to see her DDS coming up  She got her covid series this spring at [facility] not have the exact dates handy  Patient Active Problem List   Diagnosis Date Noted  . Pre-diabetes 06/02/2019  . Seasonal and perennial allergic rhinitis 12/22/2018  . Heart palpitations 12/22/2018  . History of chest pain 07/20/2018  . Brain aneurysm 07/20/2018  . Bronchitis 04/23/2018  . Tendonitis, Achilles, right 12/08/2017  . Onychomycosis 12/08/2017  . Unilateral  primary osteoarthritis, left knee 10/07/2017  . Ingrown toenail 08/10/2017  . Coughing 04/24/2017  . CAD (coronary artery disease) 03/01/2015  . Aneurysm ([place]) 03/01/2015  . Dizziness 03/01/2015  . Paresthesia of arm 03/01/2015  . Allergic rhinitis 03/01/2015  . Achalasia 09/28/2012  . CN (constipation) 09/28/2012  . Cephalalgia 08/24/2012  . HTN (hypertension) 04/19/2011  . Hyperlipidemia 04/19/2011  . GERD (gastroesophageal reflux disease) 04/19/2011  . Chest pain 04/04/2011    Past Medical History:  Diagnosis Date  . 3-vessel CAD 03/01/2015  . Achalasia 09/28/2012  . Allergic rhinitis 03/01/2015  . Anemia   . Aneurysm ([place]) 03/01/2015  . BP (high blood pressure) 03/01/2015  . Bronchitis 04/23/2018  . Cephalalgia 08/24/2012   Overview:  ICD-10 cut over    . Chest pain 04/04/2011  . CN (constipation) 09/28/2012  . Coughing 04/24/2017  . Decreased potassium in the blood 03/01/2015  . Diverticulosis   . Dizziness 03/01/2015  . GERD (gastroesophageal reflux disease)   . Hiatal hernia   . Hypercholesterolemia 03/01/2015  . Hyperlipidemia   . Hypertension   . Ingrown toenail 08/10/2017  . Inguinal hernia   . Migraine headache   . Onychomycosis 12/08/2017  . Paresthesia of arm 03/01/2015  . Schatzki's ring   . Tendonitis, Achilles, right 12/08/2017  . Unilateral primary osteoarthritis, left knee 10/07/2017    Past Surgical History:  Procedure Laterality Date  . ABDOMINAL HYSTERECTOMY    .  BACK SURGERY     x 3  . CARPAL TUNNEL RELEASE     left wrist  . CERVICAL SPINE SURGERY    . HEMORRHOID SURGERY      Social History   Tobacco Use  . Smoking status: Former Smoker    Packs/day: 0.50    Years: 20.00    Pack years: 10.00    Types: Cigarettes    Quit date: 02/19/1984    Years since quitting: 35.6  . Smokeless tobacco: Never Used  Vaping Use  . Vaping Use: Never used  Substance Use Topics  . Alcohol use: No  . Drug use: No    Family History  Problem Relation Age of  Onset  . Allergic rhinitis Sister   . Diabetes Sister   . Hypertension Sister   . Heart attack Father 45  . Heart disease Father   . Stomach cancer Maternal Grandmother   . Heart disease Mother   . Colon cancer Neg Hx   . Angioedema Neg Hx   . Asthma Neg Hx   . Eczema Neg Hx   . Urticaria Neg Hx   . Immunodeficiency Neg Hx   . Breast cancer Neg Hx   . Migraines Neg Hx   . Headache Neg Hx     Allergies  Allergen Reactions  . Shellfish Allergy Hives and Swelling  . Ace Inhibitors Other (See Comments)    Pt cannot recall her reaction but tolerates arb   . Pitavastatin   . Topiramate     Heart race,   . Sulfa Antibiotics Other (See Comments) and Rash    Fine bumps    Medication list has been reviewed and updated.  Current Outpatient Medications on File Prior to Visit  Medication Sig Dispense Refill  . aspirin 81 MG tablet Take 81 mg by mouth daily.    . Azelastine HCl 0.15 % SOLN Place 1-2 sprays into both nostrils 2 (two) times daily. 30 mL 5  . Calcium Carbonate (CALCIUM 600 PO) Take 1 tablet by mouth daily.    . fluticasone (FLONASE) 50 MCG/ACT nasal spray USE 2 SPRAYS IN NOSTRIL(S) DAILY AS NEEDED. 16 g 9  . KLOR-CON M20 20 MEQ tablet TAKE 1 TABLET BY MOUTH EVERY DAY 90 tablet 1  . Lidocaine 1.8 % PTCH Apply 1 patch topically daily. 7 patch 1  . losartan-hydrochlorothiazide (HYZAAR) 50-12.5 MG tablet Take 1 tablet by mouth 2 (two) times daily. 180 tablet 3  . metoprolol succinate (TOPROL-XL) 25 MG 24 hr tablet Take 0.5 tablets (12.5 mg total) by mouth daily. 90 tablet 1  . Multiple Vitamins-Minerals (MULTIVITAL) tablet Take by mouth.    . NONFORMULARY OR COMPOUNDED ITEM Allergen immunotherapy 2 each 1  . pantoprazole (PROTONIX) 40 MG tablet TAKE 1 TABLET BY MOUTH DAILY. TAKE 30-60 MINUTES BEFORE BREAKFAST. 90 tablet 2  . rosuvastatin (CRESTOR) 20 MG tablet Take 20 mg by mouth daily.     No current facility-administered medications on file prior to visit.    Review  of Systems:  As per HPI- otherwise negative.   Physical Examination: Vitals:   09/27/19 1457  BP: (!) 142/78  Pulse: 85  Resp: 18  Temp: 98.5 F (36.9 C)  SpO2: 97%   Vitals:   09/27/19 1457  Weight: 209 lb (94.8 kg)  Height: 5\' 5"  (1.651 m)   Body mass index is 34.78 kg/m. Ideal Body Weight: Weight in (lb) to have BMI = 25: 149.9  GEN: no acute distress. Obese, generally  looks well HEENT: Atraumatic, Normocephalic. Mild puffiness around the bilateral eyes. Conjunctive a are normal. Limited funduscopic exam normal. No pain with movement of eyes Bilateral TM wnl, oropharynx normal.  PEERL,EOMI.   She indicates the left temporal area as the distribution of her head pain, however it is not tender no nodules are palpated Ears and Nose: No external deformity. CV: RRR, No M/G/R. No JVD. No thrill. No extra heart sounds. PULM: CTA B, no wheezes, crackles, rhonchi. No retractions. No resp. distress. No accessory muscle use. EXTR: No c/c/e PSYCH: Normally interactive. Conversant.    Assessment and Plan: Left temporal headache - Plan: Sedimentation rate, C-reactive protein  Essential hypertension - Plan: losartan-hydrochlorothiazide (HYZAAR) 100-25 MG tablet, Basic metabolic panel  Eye discharge - Plan: Moxifloxacin HCl 0.5 % SOLN  Patient here today to follow-up on a couple of issues. She has had a left temporal headache for about a year. She is actually seeing neurology about this later on this week, I wonder if she may have nerve mediated pain. I do not think she has been ruled out for temporal arteritis as of yet, will check a sed rate and C-reactive protein It sounds as though she may have mild conjunctivitis. We will start her on moxifloxacin drops She is compliant with her blood pressure medication, but wonders if it could be changed to once a day. This would be easier for her to do. I will change her twice a day losartan/HCTZ to once a day She confirms she is not allergic  to losartan  Signed Lamar Blinks, MD  Addendum 8/10, received her labs as below   Your segmentation rate and CRP are negative, no evidence of temporal arteritis Metabolic profile overall looks fine.  Potassium is just slightly low-please check out a list of high potassium foods online and make sure you are getting a few of these in your diet each week Otherwise, we can plan to visit in about 6 months with you for lunch  Results for orders placed or performed in visit on 09/27/19  Sedimentation rate  Result Value Ref Range   Sed Rate 25 0 - 30 mm/hr  C-reactive protein  Result Value Ref Range   CRP <1.0 0.5 - 20.0 mg/dL  Basic metabolic panel  Result Value Ref Range   Sodium 142 135 - 145 mEq/L   Potassium 3.3 (L) 3.5 - 5.1 mEq/L   Chloride 103 96 - 112 mEq/L   CO2 32 19 - 32 mEq/L   Glucose, Bld 101 (H) 70 - 99 mg/dL   BUN 13 6 - 23 mg/dL   Creatinine, Ser 1.12 0.40 - 1.20 mg/dL   GFR 57.55 (L) >60.00 mL/min   Calcium 9.4 8.4 - 10.5 mg/dL   Message to patient

## 2019-09-28 ENCOUNTER — Telehealth: Payer: Self-pay | Admitting: Internal Medicine

## 2019-09-28 ENCOUNTER — Telehealth: Payer: Self-pay

## 2019-09-28 ENCOUNTER — Telehealth: Payer: Self-pay | Admitting: Family Medicine

## 2019-09-28 DIAGNOSIS — R109 Unspecified abdominal pain: Secondary | ICD-10-CM

## 2019-09-28 DIAGNOSIS — R197 Diarrhea, unspecified: Secondary | ICD-10-CM

## 2019-09-28 LAB — SEDIMENTATION RATE: Sed Rate: 25 mm/hr (ref 0–30)

## 2019-09-28 LAB — BASIC METABOLIC PANEL
BUN: 13 mg/dL (ref 6–23)
CO2: 32 mEq/L (ref 19–32)
Calcium: 9.4 mg/dL (ref 8.4–10.5)
Chloride: 103 mEq/L (ref 96–112)
Creatinine, Ser: 1.12 mg/dL (ref 0.40–1.20)
GFR: 57.55 mL/min — ABNORMAL LOW (ref >60.00)
Glucose, Bld: 101 mg/dL — ABNORMAL HIGH (ref 70–99)
Potassium: 3.3 mEq/L — ABNORMAL LOW (ref 3.5–5.1)
Sodium: 142 mEq/L (ref 135–145)

## 2019-09-28 LAB — C-REACTIVE PROTEIN: CRP: <1 mg/dL (ref 0.5–20.0)

## 2019-09-28 MED ORDER — RIFAXIMIN 550 MG PO TABS
550.0000 mg | ORAL_TABLET | Freq: Three times a day (TID) | ORAL | 0 refills | Status: DC
Start: 2019-09-28 — End: 2019-11-10

## 2019-09-28 MED ORDER — POLYMYXIN B-TRIMETHOPRIM 10000-0.1 UNIT/ML-% OP SOLN
1.0000 [drp] | OPHTHALMIC | 0 refills | Status: DC
Start: 2019-09-28 — End: 2019-11-10

## 2019-09-28 NOTE — Telephone Encounter (Signed)
If still diarrhea, then gi path panel If not, then rifaximin 550 mg tid x 14 days, samples if insurance wont cover Stop carafate given lack of improvement

## 2019-09-28 NOTE — Telephone Encounter (Signed)
CallerAigner Horseman  Call Back # [phone]  Patient states eye drops were prescribed, per patient cost is not high.   Please Advise

## 2019-09-28 NOTE — Telephone Encounter (Signed)
Pt states she has been taking carafate for 2 weeks now and she is not any better. Reports she is still having the same symptoms, she his losing wt and has no appetite and has "an odor coming from her body." Please advise.

## 2019-09-28 NOTE — Telephone Encounter (Signed)
Pt is still having some diarrhea, she will come in for the lab and order is in epic. She knows to stop the carafate. Script sent to pharmacy.

## 2019-09-28 NOTE — Telephone Encounter (Signed)
Pt evaluated/treated on 09/27/2019   [facility] Day - Client TELEPHONE ADVICE RECORD AccessNurse Patient Name: Stephanie Parks Gender: Female DOB: 04/09/1945 Age: 74 Y 11 M 19 D Return Phone Number: [phone] (Primary) Address[place] [address] Client [facility] - Client Client Site  [facility] - Day Physician Copland, Janett Billow- MD Contact Type Call Who Is Calling Patient / Member / Family / Caregiver Call Type Triage / Clinical Relationship To Patient Self Return Phone Number [phone] (Primary) Chief Complaint Headache Reason for Call Symptomatic / Request for [place] states she has headache, and eye swelling. Translation No Nurse Assessment Nurse: May, RN, [name] Date/Time Eilene Ghazi Time): 09/27/2019 8:54:27 AM Confirm and document reason for call. If symptomatic, describe symptoms. ---Caller states she has bilateral eye swelling that started this morning. No fever. Caller states she has had an intermittent headache for two weeks. Has the patient had close contact with a person known or suspected to have the novel coronavirus illness OR traveled / lives in area with major community spread (including international travel) in the last 14 days from the onset of symptoms? * If Asymptomatic, screen for exposure and travel within the last 14 days. ---No Does the patient have any new or worsening symptoms? ---Yes Will a triage be completed? ---Yes Related visit to physician within the last 2 weeks? ---No Does the PT have any chronic conditions? (i.e. diabetes, asthma, this includes High risk factors for pregnancy, etc.) ---Yes List chronic conditions. ---htn Is this a behavioral health or substance abuse call? ---No Guidelines Guideline Title Affirmed Question Affirmed Notes Nurse Date/Time (Eastern Time) Eye - Swelling [1] MILD eyelid  swelling (puffiness) AND [2] sinus pain or pressure May, RN, [name] 09/27/2019 8:55:53 AM Disp. Time Eilene Ghazi Time) Disposition Final User 09/27/2019 8:59:40 AM See PCP within 24 Hours Yes May, RN, [name] PLEASE NOTE: All timestamps contained within this report are represented as [place] Standard Time. CONFIDENTIALTY NOTICE: This fax transmission is intended only for the addressee. It contains information that is legally privileged, confidential or otherwise protected from use or disclosure. If you are not the intended recipient, you are strictly prohibited from reviewing, disclosing, copying using or disseminating any of this information or taking any action in reliance on or regarding this information. If you have received this fax in error, please notify us immediately by telephone so that we can arrange for its return to [place]. Phone: [phone], Toll-Free: [phone], Fax: [phone] Page: 2 of 2 Call Id: [id number] [place] Disagree/Comply Comply Caller Understands Yes PreDisposition Did not know what to do Care Advice Given Per Guideline SEE PCP WITHIN 24 HOURS: * IF OFFICE WILL BE OPEN: You need to be examined within the next 24 hours. Call your doctor (or NP/PA) when the office opens and make an appointment. * ACETAMINOPHEN - EXTRA STRENGTH TYLENOL: Take 1,000 mg (two 500 mg pills) every 8 hours as needed. Each Extra Strength Tylenol pill has 500 mg of acetaminophen. The most you should take each day is 3,000 mg (6 pills a day). * IBUPROFEN (E.G., MOTRIN, ADVIL): Take 400 mg (two 200 mg pills) by mouth every 6 hours. The most you should take each day is 1,200 mg (six 200 mg pills), unless your doctor has told you to take more. CALL BACK IF: * Fever occurs * You become worse. CARE ADVICE given per Eye - Swelling (Adult) guideline. LOCAL COLD: * Apply a cool, wet washcloth to  the area for 20 minutes. Referrals REFERRED TO PCP OFFIC

## 2019-09-28 NOTE — Telephone Encounter (Signed)
See mychartt message from patient.

## 2019-09-29 DIAGNOSIS — G4452 New daily persistent headache (NDPH): Secondary | ICD-10-CM | POA: Diagnosis not present

## 2019-09-29 DIAGNOSIS — G518 Other disorders of facial nerve: Secondary | ICD-10-CM | POA: Diagnosis not present

## 2019-09-29 DIAGNOSIS — M316 Other giant cell arteritis: Secondary | ICD-10-CM | POA: Diagnosis not present

## 2019-09-29 DIAGNOSIS — G4489 Other headache syndrome: Secondary | ICD-10-CM | POA: Diagnosis not present

## 2019-09-29 DIAGNOSIS — G4733 Obstructive sleep apnea (adult) (pediatric): Secondary | ICD-10-CM | POA: Diagnosis not present

## 2019-10-01 ENCOUNTER — Other Ambulatory Visit: Payer: Medicare Other

## 2019-10-01 ENCOUNTER — Telehealth: Payer: Self-pay | Admitting: Internal Medicine

## 2019-10-01 NOTE — Telephone Encounter (Signed)
See note below and advise. 

## 2019-10-04 ENCOUNTER — Other Ambulatory Visit: Payer: Self-pay

## 2019-10-04 ENCOUNTER — Other Ambulatory Visit: Payer: Medicare Other

## 2019-10-04 ENCOUNTER — Ambulatory Visit: Payer: Medicare Other | Admitting: Family Medicine

## 2019-10-04 ENCOUNTER — Encounter: Payer: Self-pay | Admitting: Family Medicine

## 2019-10-04 VITALS — BP 112/48 | HR 73 | Temp 98.3°F | Resp 14 | Ht 65.0 in | Wt 209.0 lb

## 2019-10-04 DIAGNOSIS — H1010 Acute atopic conjunctivitis, unspecified eye: Secondary | ICD-10-CM

## 2019-10-04 DIAGNOSIS — J309 Allergic rhinitis, unspecified: Secondary | ICD-10-CM

## 2019-10-04 DIAGNOSIS — H6981 Other specified disorders of Eustachian tube, right ear: Secondary | ICD-10-CM

## 2019-10-04 DIAGNOSIS — R197 Diarrhea, unspecified: Secondary | ICD-10-CM | POA: Diagnosis not present

## 2019-10-04 DIAGNOSIS — R109 Unspecified abdominal pain: Secondary | ICD-10-CM

## 2019-10-04 MED ORDER — FLUTICASONE PROPIONATE 50 MCG/ACT NA SUSP: NASAL | 9 refills | Status: DC

## 2019-10-04 MED ORDER — OLOPATADINE HCL 0.2 % OP SOLN: 1.0000 [drp] | Freq: Every day | OPHTHALMIC | 1 refills | Status: DC | PRN

## 2019-10-04 MED ORDER — EPINEPHRINE 0.3 MG/0.3ML IJ SOAJ
0.3000 mg | INTRAMUSCULAR | 2 refills | Status: DC | PRN
Start: 2019-10-04 — End: 2021-09-27

## 2019-10-04 NOTE — Telephone Encounter (Signed)
If you can that would be great and I will let ms. Nicole know.

## 2019-10-04 NOTE — Patient Instructions (Addendum)
Allergic rhinitis Begin prednisone 10 mg 1 tablet twice a day for 4 days, then take 1 tablet on the 5th day, then stop Continue Flonase 2 sprays in each nostril once a day for a stuffy nose.  In the right nostril, point the applicator out toward the right ear. In the left nostril, point the applicator out toward the left ear Continue azelastine 1 spray in each nostril twice a day for a runny nose or headache Begin saline nasal rinses as needed for nasal symptoms. Use this before any medicated nasal sprays for best result Continue allergen avoidance measures directed toward dust mite, cockroach, and weed pollen as listed below Continue allergen immunotherapy and have access to an epinephrine autoinjector set  Allergic conjunctivitis Begin olopatadine 0.2% one drop in each eye once a day as needed for red, itchy eyes. .Avoid the allergen! Glasses/sunglasses and a hat outside can help. Close windows and use air conditioning when the pollen counts are high. Vacuum filters, bedding covers, washing bedding often, and keeping pets out of your bedroom can help. Marland KitchenAvoid rubbing your eyes. .Cool compresses may be soothing. .Artificial Tears can rinse allergens from the eyes. We recommend Preservative Free. Refrigerating the dropscan be soothing.  Eustachian tube dysfunction  Begin saline nasal rinses Continue Flonase as above  Follow up in 2 weeks or sooner if needed  Reducing Pollen Exposure The American Academy of Allergy, Asthma and Immunology suggests the following steps to reduce your exposure to pollen during allergy seasons. 1. Do not hang sheets or clothing out to dry; pollen may collect on these items. 2. Do not mow lawns or spend time around freshly cut grass; mowing stirs up pollen. 3. Keep windows closed at night.  Keep car windows closed while driving. 4. Minimize morning activities outdoors, a time when pollen counts are usually at their highest. 5. Stay indoors as much as possible when  pollen counts or humidity is high and on windy days when pollen tends to remain in the air longer. 6. Use air conditioning when possible.  Many air conditioners have filters that trap the pollen spores. 7. Use a HEPA room air filter to remove pollen form the indoor air you breathe.   Control of Dust Mite Allergen Dust mites play a major role in allergic asthma and rhinitis. They occur in environments with high humidity wherever human skin is found. Dust mites absorb humidity from the atmosphere (ie, they do not drink) and feed on organic matter (including shed human and animal skin). Dust mites are a microscopic type of insect that you cannot see with the naked eye. High levels of dust mites have been detected from mattresses, pillows, carpets, upholstered furniture, bed covers, clothes, soft toys and any woven material. The principal allergen of the dust mite is found in its feces. A gram of dust may contain 1,000 mites and 250,000 fecal particles. Mite antigen is easily measured in the air during house cleaning activities. Dust mites do not bite and do not cause harm to humans, other than by triggering allergies/asthma.  Ways to decrease your exposure to dust mites in your home:  1. Encase mattresses, box springs and pillows with a mite-impermeable barrier or cover  2. Wash sheets, blankets and drapes weekly in hot water (130 F) with detergent and dry them in a dryer on the hot setting.  3. Have the room cleaned frequently with a vacuum cleaner and a damp dust-mop. For carpeting or rugs, vacuuming with a vacuum cleaner equipped with a  high-efficiency particulate air (HEPA) filter. The dust mite allergic individual should not be in a room which is being cleaned and should wait 1 hour after cleaning before going into the room.  4. Do not sleep on upholstered furniture (eg, couches).  5. If possible removing carpeting, upholstered furniture and drapery from the home is ideal. Horizontal blinds  should be eliminated in the rooms where the person spends the most time (bedroom, study, television room). Washable vinyl, roller-type shades are optimal.  6. Remove all non-washable stuffed toys from the bedroom. Wash stuffed toys weekly like sheets and blankets above.  7. Reduce indoor humidity to less than 50%. Inexpensive humidity monitors can be purchased at most hardware stores. Do not use a humidifier as can make the problem worse and are not recommended.  Control of Cockroach Allergen  Cockroach allergen has been identified as an important cause of acute attacks of asthma, especially in urban settings.  There are fifty-five species of cockroach that exist in the [place], however only three, the [place], [place] species produce allergen that can affect patients with Asthma.  Allergens can be obtained from fecal particles, egg casings and secretions from cockroaches.    1. Remove food sources. 2. Reduce access to water. 3. Seal access and entry points. 4. Spray runways with 0.5-1% Diazinon or Chlorpyrifos 5. Blow boric acid power under stoves and refrigerator. 6. Place bait stations (hydramethylnon) at feeding sites.

## 2019-10-04 NOTE — Telephone Encounter (Signed)
Can we provide samples?  Explain it may take a bit to obtain from rep Thanks

## 2019-10-04 NOTE — Progress Notes (Addendum)
[address] [place] [address] Dept: [phone]  FOLLOW UP NOTE  Patient ID: Stephanie Parks, female    DOB: 16-Nov-1945  Age: 74 y.o. MRN: [id number] Date of Office Visit: 10/04/2019  Assessment  Chief Complaint: Nasal Congestion  HPI Stephanie Parks is a 74 year old female who presents to the clinic for an acute evaluation of facial pressure.  She was last seen in this clinic on 03/15/2019 by Dr. Shaune Leeks for evaluation of eustachian tube dysfunction, dizziness, essential hypertension, use of a beta-blocker, and reflux.  At today's visit, she reports she has experienced facial pressure, intermittent nasal congestion, and she reports ocular pruritus, clear ocular drainage in addition to crusty ocular drainage without discoloration for the last 2 weeks.  She denies fever or sick contacts.  She continues Flonase and azelastine as needed.  She is not currently using a saline nasal rinse.  She reports poor technique while using Flonase nasal spray.  She is not currently taking an antihistamine.  She continues allergen immunotherapy with no large local reactions.  She reports her symptoms of allergic rhinitis have significantly decreased while continuing on allergen immunotherapy.  Allergic conjunctivitis is reported as poorly controlled with frequent redness, itching, and clear occasionally runny and occasionally thick drainage.  She reports going to her primary care provider and receiving Polytrim which she reports has not relieved her symptoms at this time.  She reports bilateral ear fullness and muffled hearing in the right ear.  She denies ear pain.  She also reports left sided temporal pressure for which she has seen neurology specialist and has recently had a head CT and CTA on 08/05/2019 for headache which indicated:"unchanged 5 mm right paraophthalmic ICA aneurysm.  Otherwise unremarkable head and CTA".  Her current medications are listed in the chart   Drug Allergies:  Allergies    Allergen Reactions   Shellfish Allergy Hives and Swelling   Ace Inhibitors Other (See Comments)    Pt cannot recall her reaction but tolerates arb    Pitavastatin    Topiramate     Heart race,    Sulfa Antibiotics Other (See Comments) and Rash    Fine bumps    Physical Exam: BP (!) 112/48 (BP Location: Right Arm, Patient Position: Sitting, Cuff Size: Normal)    Pulse 73    Temp 98.3 F (36.8 C) (Oral)    Resp 14    Ht 5\' 5"  (1.651 m)    Wt 209 lb (94.8 kg)    SpO2 98%    BMI 34.78 kg/m    Physical Exam Vitals reviewed.  Constitutional:      Appearance: Normal appearance.  HENT:     Head: Normocephalic and atraumatic.     Right Ear: Tympanic membrane normal.     Left Ear: Tympanic membrane normal.     Nose:     Comments: Bilateral nares edematous and pale with clear nasal drainage noted.  Pharynx slightly erythematous with no exudate.  Right ear with clear effusion.  Left ear with full retraction.  Eyes normal. Eyes:     Conjunctiva/sclera: Conjunctivae normal.  Cardiovascular:     Rate and Rhythm: Normal rate and regular rhythm.     Heart sounds: Normal heart sounds. No murmur heard.   Pulmonary:     Effort: Pulmonary effort is normal.     Breath sounds: Normal breath sounds.     Comments: Lungs clear to auscultation Musculoskeletal:        General: Normal range  of motion.     Cervical back: Normal range of motion and neck supple.  Skin:    General: Skin is warm and dry.  Neurological:     Mental Status: She is alert and oriented to person, place, and time.  Psychiatric:        Mood and Affect: Mood normal.        Behavior: Behavior normal.        Thought Content: Thought content normal.        Judgment: Judgment normal.    Assessment and Plan: 1. Allergic rhinitis, unspecified seasonality, unspecified trigger   2. Seasonal allergic conjunctivitis   3. Dysfunction of right eustachian tube     Meds ordered this encounter  Medications   Olopatadine HCl  0.2 % SOLN    Sig: Apply 1 drop to eye daily as needed for up to 31 doses.    Dispense:  2.5 mL    Refill:  1   fluticasone (FLONASE) 50 MCG/ACT nasal spray    Sig: USE 2 SPRAYS IN NOSTRIL(S) DAILY AS NEEDED.  In the right nostril point the applicator out toward your right ear.  In the left nostril point the applicator out toward her left ear    Dispense:  16 g    Refill:  9   EPINEPHrine (EPIPEN 2-PAK) 0.3 mg/0.3 mL IJ SOAJ injection    Sig: Inject 0.3 mLs (0.3 mg total) into the muscle as needed for anaphylaxis. Per allergen immunotherapy protocol    Dispense:  1 each    Refill:  2    Patient Instructions  Allergic rhinosinusitis Begin prednisone 10 mg 1 tablet twice a day for 4 days, then take 1 tablet on the 5th day, then stop Continue Flonase 2 sprays in each nostril once a day for a stuffy nose.  In the right nostril, point the applicator out toward the right ear. In the left nostril, point the applicator out toward the left ear Continue azelastine 1 spray in each nostril twice a day for a runny nose or headache Begin saline nasal rinses as needed for nasal symptoms. Use this before any medicated nasal sprays for best result Continue allergen avoidance measures directed toward dust mite, cockroach, and weed pollen as listed below  Allergic conjunctivitis Begin olopatadine 0.2% one drop in each eye once a day as needed for red, itchy eyes. Avoid the allergen! Glasses/sunglasses and a hat outside can help. Close windows and use air conditioning when the pollen counts are high. Vacuum filters, bedding covers, washing bedding often, and keeping pets out of your bedroom can help. Avoid rubbing your eyes. Cool compresses may be soothing. Artificial Tears can rinse allergens from the eyes. We recommend Preservative Free. Refrigerating the dropscan be soothing.  Eustachian tube dysfunction  Begin saline nasal rinses Continue Flonase as above  Follow up in 2 weeks or sooner if  needed   Return in about 2 weeks (around 10/18/2019), or if symptoms worsen or fail to improve.    Thank you for the opportunity to care for this patient.  Please do not hesitate to contact me with questions.  Gareth Morgan, FNP [facility]  ________________________________________________  I have provided oversight concerning [facility]'s evaluation and treatment of this patient's health issues addressed during today's encounter.  I agree with the assessment and therapeutic plan as outlined in the note.   Signed,   R Edgar Frisk, MD

## 2019-10-04 NOTE — Telephone Encounter (Signed)
Stephanie Paganini do we have samples of xifaxin to give for IBS-D?

## 2019-10-04 NOTE — Telephone Encounter (Signed)
Samples left up front for pt to pick up.

## 2019-10-04 NOTE — Telephone Encounter (Signed)
We do - do you want me to put some up front for patient?

## 2019-10-04 NOTE — Telephone Encounter (Signed)
Putting them up front now

## 2019-10-06 LAB — GI PROFILE, STOOL, PCR

## 2019-10-11 ENCOUNTER — Other Ambulatory Visit: Payer: Self-pay | Admitting: Family Medicine

## 2019-10-11 DIAGNOSIS — Z5181 Encounter for therapeutic drug level monitoring: Secondary | ICD-10-CM

## 2019-10-14 DIAGNOSIS — H11823 Conjunctivochalasis, bilateral: Secondary | ICD-10-CM | POA: Diagnosis not present

## 2019-10-14 DIAGNOSIS — H04123 Dry eye syndrome of bilateral lacrimal glands: Secondary | ICD-10-CM | POA: Diagnosis not present

## 2019-10-14 DIAGNOSIS — H0235 Blepharochalasis left lower eyelid: Secondary | ICD-10-CM | POA: Diagnosis not present

## 2019-10-14 DIAGNOSIS — H0232 Blepharochalasis right lower eyelid: Secondary | ICD-10-CM | POA: Diagnosis not present

## 2019-10-18 ENCOUNTER — Other Ambulatory Visit: Payer: Self-pay | Admitting: *Deleted

## 2019-10-18 NOTE — Patient Outreach (Signed)
[facility]) Care Management  10/18/2019  Stephanie Parks [birth year] [phone]  Telephone outreach:  [name] has been having increased episodes of palpitations on a daily basis. She saw her cardiologist in June. She has been evaluated for this complaint in the past and no concerning arrhythmia was noted but she did have some PVCs and PNCs. Of note her potassium level is a little low 3.3. She is on Klor Con 20 mg daily. Also suggested she try coughing when she has an episode to see if it may interrupt the episode.  She also has noted a new issue: periorbital edema. She has been to see Dr. Edilia Bo, an ENT and an ophthalmologist about this complaint. Today she mentions her CPAP mask is rather tight! Have advised that she take in her mask the next time she sees Dr. Edilia Bo. Perhaps she may need a different type apparatus. Also suggested cold compress when she awakens in the am.  She mentions a bowel or rectal odor that is more unpleasant than usual. Have suggested she take a probiotic for a trial period to see if that makes a difference.  Have requested she update me on progress or decline in any of the above sxs. I will call her again at the end of September for follow up.  [name]. [name], MSN, [facility] Gerontological Nurse Practitioner [facility] Care Management [phone]     [name]. [name], MSN, [facility] Gerontological Nurse Practitioner [facility] Care Management [phone]

## 2019-10-19 ENCOUNTER — Encounter: Payer: Self-pay | Admitting: Family Medicine

## 2019-10-19 ENCOUNTER — Telehealth: Payer: Self-pay | Admitting: Family Medicine

## 2019-10-19 NOTE — Telephone Encounter (Signed)
Reviewed her recent labs and medications Potassium level 09/27/19 3.3  She is taking potassium 20 mEq 1 daily Also HCTZ 25 daily  I will have her take an extra 20 mEq of potassium 2 times a week-her K has run on the low side over the last year  Message to patient

## 2019-10-19 NOTE — Telephone Encounter (Signed)
-----   Message from Deloria Lair, NP sent at 10/18/2019 12:06 PM EDT ----- Hello, Dr. Edilia Bo!  I talked with Ms. Tapscott today. Please see my attached note. Perhaps an adjustment in her supplement may be prudent. Please let me know your thoughts and anything you would like me to do.  Hubert Azure [name], MSN, [facility] Gerontological Nurse Practitioner [facility] Care Management [phone]

## 2019-10-20 DIAGNOSIS — G4733 Obstructive sleep apnea (adult) (pediatric): Secondary | ICD-10-CM | POA: Diagnosis not present

## 2019-10-27 ENCOUNTER — Other Ambulatory Visit: Payer: Self-pay | Admitting: Family Medicine

## 2019-10-30 ENCOUNTER — Other Ambulatory Visit: Payer: Self-pay | Admitting: Family Medicine

## 2019-10-31 ENCOUNTER — Encounter (HOSPITAL_BASED_OUTPATIENT_CLINIC_OR_DEPARTMENT_OTHER): Payer: Self-pay | Admitting: Emergency Medicine

## 2019-10-31 ENCOUNTER — Emergency Department (HOSPITAL_BASED_OUTPATIENT_CLINIC_OR_DEPARTMENT_OTHER): Payer: Medicare Other

## 2019-10-31 ENCOUNTER — Emergency Department (HOSPITAL_BASED_OUTPATIENT_CLINIC_OR_DEPARTMENT_OTHER)
Admission: EM | Admit: 2019-10-31 | Discharge: 2019-10-31 | Disposition: A | Discharge status: Home / Self Care | Payer: Medicare Other | Attending: Emergency Medicine | Admitting: Emergency Medicine

## 2019-10-31 ENCOUNTER — Other Ambulatory Visit: Payer: Self-pay

## 2019-10-31 DIAGNOSIS — R531 Weakness: Secondary | ICD-10-CM | POA: Insufficient documentation

## 2019-10-31 DIAGNOSIS — R002 Palpitations: Secondary | ICD-10-CM | POA: Insufficient documentation

## 2019-10-31 DIAGNOSIS — R202 Paresthesia of skin: Secondary | ICD-10-CM | POA: Diagnosis not present

## 2019-10-31 DIAGNOSIS — R519 Headache, unspecified: Secondary | ICD-10-CM | POA: Diagnosis not present

## 2019-10-31 DIAGNOSIS — I10 Essential (primary) hypertension: Secondary | ICD-10-CM | POA: Insufficient documentation

## 2019-10-31 DIAGNOSIS — R079 Chest pain, unspecified: Secondary | ICD-10-CM | POA: Diagnosis not present

## 2019-10-31 DIAGNOSIS — F17200 Nicotine dependence, unspecified, uncomplicated: Secondary | ICD-10-CM | POA: Diagnosis not present

## 2019-10-31 DIAGNOSIS — Q283 Other malformations of cerebral vessels: Secondary | ICD-10-CM | POA: Diagnosis not present

## 2019-10-31 DIAGNOSIS — I672 Cerebral atherosclerosis: Secondary | ICD-10-CM | POA: Diagnosis not present

## 2019-10-31 DIAGNOSIS — Z79899 Other long term (current) drug therapy: Secondary | ICD-10-CM | POA: Insufficient documentation

## 2019-10-31 DIAGNOSIS — I6521 Occlusion and stenosis of right carotid artery: Secondary | ICD-10-CM | POA: Diagnosis not present

## 2019-10-31 DIAGNOSIS — I2510 Atherosclerotic heart disease of native coronary artery without angina pectoris: Secondary | ICD-10-CM | POA: Diagnosis not present

## 2019-10-31 DIAGNOSIS — R42 Dizziness and giddiness: Secondary | ICD-10-CM | POA: Diagnosis not present

## 2019-10-31 DIAGNOSIS — Z87891 Personal history of nicotine dependence: Secondary | ICD-10-CM | POA: Diagnosis not present

## 2019-10-31 DIAGNOSIS — Z7982 Long term (current) use of aspirin: Secondary | ICD-10-CM | POA: Diagnosis not present

## 2019-10-31 DIAGNOSIS — R072 Precordial pain: Secondary | ICD-10-CM | POA: Diagnosis not present

## 2019-10-31 DIAGNOSIS — I6503 Occlusion and stenosis of bilateral vertebral arteries: Secondary | ICD-10-CM | POA: Diagnosis not present

## 2019-10-31 DIAGNOSIS — I671 Cerebral aneurysm, nonruptured: Secondary | ICD-10-CM | POA: Diagnosis not present

## 2019-10-31 LAB — BASIC METABOLIC PANEL
Anion gap: 11 (ref 5–15)
BUN: 10 mg/dL (ref 8–23)
CO2: 29 mmol/L (ref 22–32)
Calcium: 9.6 mg/dL (ref 8.9–10.3)
Chloride: 100 mmol/L (ref 98–111)
Creatinine, Ser: 0.79 mg/dL (ref 0.44–1.00)
GFR calc Af Amer: >60 mL/min (ref >60)
GFR calc non Af Amer: >60 mL/min (ref >60)
Glucose, Bld: 100 mg/dL — ABNORMAL HIGH (ref 70–99)
Potassium: 3.1 mmol/L — ABNORMAL LOW (ref 3.5–5.1)
Sodium: 140 mmol/L (ref 135–145)

## 2019-10-31 LAB — URINALYSIS, ROUTINE W REFLEX MICROSCOPIC
Bilirubin Urine: NEGATIVE
Glucose, UA: NEGATIVE mg/dL
Hgb urine dipstick: NEGATIVE
Ketones, ur: NEGATIVE mg/dL
Leukocytes,Ua: NEGATIVE
Nitrite: NEGATIVE
Protein, ur: NEGATIVE mg/dL
Specific Gravity, Urine: 1.015 (ref 1.005–1.030)
pH: 8.5 — ABNORMAL HIGH (ref 5.0–8.0)

## 2019-10-31 LAB — CBC
HCT: 36.5 % (ref 36.0–46.0)
Hemoglobin: 11.9 g/dL — ABNORMAL LOW (ref 12.0–15.0)
MCH: 28.7 pg (ref 26.0–34.0)
MCHC: 32.6 g/dL (ref 30.0–36.0)
MCV: 88.2 fL (ref 80.0–100.0)
Platelets: 323 10*3/uL (ref 150–400)
RBC: 4.14 MIL/uL (ref 3.87–5.11)
RDW: 14.6 % (ref 11.5–15.5)
WBC: 8.1 10*3/uL (ref 4.0–10.5)
nRBC: 0 % (ref 0.0–0.2)

## 2019-10-31 LAB — TROPONIN I (HIGH SENSITIVITY)
Troponin I (High Sensitivity): 5 ng/L (ref <18)
Troponin I (High Sensitivity): 5 ng/L (ref <18)

## 2019-10-31 LAB — BRAIN NATRIURETIC PEPTIDE: B Natriuretic Peptide: 111.4 pg/mL — ABNORMAL HIGH (ref 0.0–100.0)

## 2019-10-31 LAB — CBG MONITORING, ED: Glucose-Capillary: 76 mg/dL (ref 70–99)

## 2019-10-31 MED ORDER — IOHEXOL 350 MG/ML SOLN
100.0000 mL | Freq: Once | INTRAVENOUS | Status: AC | PRN
  Administered 2019-10-31: 100 mL via INTRAVENOUS

## 2019-10-31 MED ORDER — SODIUM CHLORIDE 0.9 % IV BOLUS: 500.0000 mL | Freq: Once | INTRAVENOUS | Status: DC

## 2019-10-31 MED ORDER — METOCLOPRAMIDE HCL 5 MG/ML IJ SOLN
10.0000 mg | Freq: Once | INTRAMUSCULAR | Status: AC
  Administered 2019-10-31: 10 mg via INTRAVENOUS
  Filled 2019-10-31: qty 2

## 2019-10-31 MED ORDER — FAMOTIDINE IN NACL 20-0.9 MG/50ML-% IV SOLN
20.0000 mg | Freq: Once | INTRAVENOUS | Status: AC
  Administered 2019-10-31: 20 mg via INTRAVENOUS
  Filled 2019-10-31: qty 50

## 2019-10-31 MED ORDER — DIPHENHYDRAMINE HCL 50 MG/ML IJ SOLN
12.5000 mg | Freq: Once | INTRAMUSCULAR | Status: AC
  Administered 2019-10-31: 12.5 mg via INTRAVENOUS
  Filled 2019-10-31: qty 1

## 2019-10-31 MED ORDER — POTASSIUM CHLORIDE CRYS ER 20 MEQ PO TBCR
40.0000 meq | EXTENDED_RELEASE_TABLET | Freq: Once | ORAL | Status: AC
  Administered 2019-10-31: 40 meq via ORAL
  Filled 2019-10-31: qty 2

## 2019-10-31 NOTE — ED Provider Notes (Signed)
[facility]ER[facility]ENC[facility] DEPARTMENT Provider Note   CSN: [id number] Arrival date & time: 10/31/19  1227      History Chief Complaint  Patient presents with  . Palpitations  . Dizziness  . Headache  . Weakness    Stephanie Parks is a 74 y.o. female history of CAD, hypertension who presented with multiple complaints.  Patient states that for the last several weeks she has a constellation of symptoms.  Patient states that occasionally she gets palpitations.  She also has intermittent dizziness as well.  She also has intermittent left arm weakness.  Patient had some substernal chest pain today which prompted her to come to the ED.  In fact, patient has evaluation for this previously.  Patient has seen neurology and neurosurgery previously.  She has a known cerebral aneurysm that was small and not operated on.  Patient does have some headaches.  Patient does have CAD but has no cardiac stents.  Patient denies any recent travel or fever or cough.  The history is provided by the patient.       Past Medical History:  Diagnosis Date  . 3-vessel CAD 03/01/2015  . Achalasia 09/28/2012  . Allergic rhinitis 03/01/2015  . Anemia   . Aneurysm ([place]) 03/01/2015  . BP (high blood pressure) 03/01/2015  . Bronchitis 04/23/2018  . Cephalalgia 08/24/2012   Overview:  ICD-10 cut over    . Chest pain 04/04/2011  . CN (constipation) 09/28/2012  . Coughing 04/24/2017  . Decreased potassium in the blood 03/01/2015  . Diverticulosis   . Dizziness 03/01/2015  . GERD (gastroesophageal reflux disease)   . Hiatal hernia   . Hypercholesterolemia 03/01/2015  . Hyperlipidemia   . Hypertension   . Ingrown toenail 08/10/2017  . Inguinal hernia   . Migraine headache   . Onychomycosis 12/08/2017  . Paresthesia of arm 03/01/2015  . Schatzki's ring   . Tendonitis, Achilles, right 12/08/2017  . Unilateral primary osteoarthritis, left knee 10/07/2017    Patient Active Problem List   Diagnosis Date Noted  .  Seasonal allergic conjunctivitis 10/04/2019  . Dysfunction of right eustachian tube 10/04/2019  . Pre-diabetes 06/02/2019  . Seasonal and perennial allergic rhinitis 12/22/2018  . Heart palpitations 12/22/2018  . History of chest pain 07/20/2018  . Brain aneurysm 07/20/2018  . Bronchitis 04/23/2018  . Tendonitis, Achilles, right 12/08/2017  . Onychomycosis 12/08/2017  . Unilateral primary osteoarthritis, left knee 10/07/2017  . Ingrown toenail 08/10/2017  . Coughing 04/24/2017  . CAD (coronary artery disease) 03/01/2015  . Aneurysm ([place]) 03/01/2015  . Dizziness 03/01/2015  . Paresthesia of arm 03/01/2015  . Allergic rhinitis 03/01/2015  . Achalasia 09/28/2012  . CN (constipation) 09/28/2012  . Cephalalgia 08/24/2012  . HTN (hypertension) 04/19/2011  . Hyperlipidemia 04/19/2011  . GERD (gastroesophageal reflux disease) 04/19/2011  . Chest pain 04/04/2011    Past Surgical History:  Procedure Laterality Date  . ABDOMINAL HYSTERECTOMY    . BACK SURGERY     x 3  . CARPAL TUNNEL RELEASE     left wrist  . CERVICAL SPINE SURGERY    . HEMORRHOID SURGERY       OB History   No obstetric history on file.     Family History  Problem Relation Age of Onset  . Allergic rhinitis Sister   . Diabetes Sister   . Hypertension Sister   . Heart attack Father 74  . Heart disease Father   . Stomach cancer Maternal Grandmother   . Heart  disease Mother   . Colon cancer Neg Hx   . Angioedema Neg Hx   . Asthma Neg Hx   . Eczema Neg Hx   . Urticaria Neg Hx   . Immunodeficiency Neg Hx   . Breast cancer Neg Hx   . Migraines Neg Hx   . Headache Neg Hx     Social History   Tobacco Use  . Smoking status: Former Smoker    Packs/day: 0.50    Years: 20.00    Pack years: 10.00    Types: Cigarettes    Quit date: 02/19/1984    Years since quitting: 35.7  . Smokeless tobacco: Never Used  Vaping Use  . Vaping Use: Never used  Substance Use Topics  . Alcohol use: No  . Drug use: No     Home Medications Prior to Admission medications   Medication Sig Start Date End Date Taking? Authorizing Provider  aspirin 81 MG tablet Take 81 mg by mouth daily.    [provider]  Azelastine HCl 0.15 % SOLN Place 1-2 sprays into both nostrils 2 (two) times daily. 12/22/18   Dara Hoyer, FNP  Calcium Carbonate (CALCIUM 600 PO) Take 1 tablet by mouth daily.    [provider]  EPINEPHrine (EPIPEN 2-PAK) 0.3 mg/0.3 mL IJ SOAJ injection Inject 0.3 mLs (0.3 mg total) into the muscle as needed for anaphylaxis. Per allergen immunotherapy protocol 10/04/19   Ambs, Kathrine Cords, FNP  fluticasone (FLONASE) 50 MCG/ACT nasal spray USE 2 SPRAYS IN NOSTRIL(S) DAILY AS NEEDED.  In the right nostril point the applicator out toward your right ear.  In the left nostril point the applicator out toward her left ear 10/04/19   Ambs, Kathrine Cords, FNP  KLOR-CON M20 20 MEQ tablet TAKE 1 TABLET BY MOUTH EVERY DAY 10/11/19   Copland, Gay Filler, MD  Lidocaine 1.8 % PTCH Apply 1 patch topically daily. Patient not taking: Reported on 10/04/2019 09/11/19   Dorie Rank, MD  losartan-hydrochlorothiazide (HYZAAR) 100-25 MG tablet Take 1 tablet by mouth daily. 09/27/19   Copland, Gay Filler, MD  metoprolol succinate (TOPROL-XL) 25 MG 24 hr tablet Take 0.5 tablets (12.5 mg total) by mouth daily. 09/06/19   Copland, Gay Filler, MD  Multiple Vitamins-Minerals (MULTIVITAL) tablet Take by mouth.    [provider]  NONFORMULARY OR COMPOUNDED ITEM Allergen immunotherapy 03/01/15   Leda Roys, MD  Olopatadine HCl 0.2 % SOLN Apply 1 drop to eye daily as needed. 10/27/19   Dara Hoyer, FNP  pantoprazole (PROTONIX) 40 MG tablet TAKE 1 TABLET BY MOUTH DAILY. TAKE 30-60 MINUTES BEFORE BREAKFAST. Patient not taking: Reported on 10/04/2019 07/20/19   Esterwood, Amy S, PA-C  rifaximin (XIFAXAN) 550 MG TABS tablet Take 1 tablet (550 mg total) by mouth 3 (three) times daily. Patient not taking: Reported on 10/04/2019 09/28/19   Jerene Bears, MD  rosuvastatin (CRESTOR) 20 MG tablet Take 20 mg by mouth daily.    [provider]  trimethoprim-polymyxin b (POLYTRIM) ophthalmic solution Place 1 drop into both eyes every 4 (four) hours. Use while awake for 5-7 days 09/28/19   Copland, Gay Filler, MD    Allergies    Shellfish allergy, Ace inhibitors, Pitavastatin, Topiramate, and Sulfa antibiotics  Review of Systems   Review of Systems  Cardiovascular: Positive for palpitations.  Neurological: Positive for dizziness, weakness and headaches.  All other systems reviewed and are negative.   Physical Exam Updated Vital Signs BP (!) 148/73  Pulse (!) 56   Temp 98 F (36.7 C) (Oral)   Resp 13   Wt 94.3 kg   SpO2 100%   BMI 34.61 kg/m   Physical Exam Vitals and nursing note reviewed.  Constitutional:      Comments: Slightly anxious   HENT:     Head: Normocephalic.     Mouth/Throat:     Mouth: Mucous membranes are moist.  Eyes:     Extraocular Movements: Extraocular movements intact.     Pupils: Pupils are equal, round, and reactive to light.  Cardiovascular:     Rate and Rhythm: Normal rate and regular rhythm.  Pulmonary:     Effort: Pulmonary effort is normal.     Breath sounds: Normal breath sounds.  Abdominal:     Palpations: Abdomen is soft.  Musculoskeletal:        General: Normal range of motion.     Cervical back: Normal range of motion and neck supple.  Neurological:     Mental Status: She is alert and oriented to person, place, and time.     Cranial Nerves: No cranial nerve deficit or facial asymmetry.     Sensory: No sensory deficit.     Motor: No weakness.     Coordination: Romberg sign negative.     Comments: CN 2- 12 intact, nl strength and sensation throughout, nl gait   Psychiatric:        Mood and Affect: Mood normal.        Behavior: Behavior normal.     ED Results / Procedures / Treatments   Labs (all labs ordered are listed, but only abnormal results are displayed) Labs  Reviewed  BASIC METABOLIC PANEL - Abnormal; Notable for the following components:      Result Value   Potassium 3.1 (*)    Glucose, Bld 100 (*)    All other components within normal limits  CBC - Abnormal; Notable for the following components:   Hemoglobin 11.9 (*)    All other components within normal limits  URINALYSIS, ROUTINE W REFLEX MICROSCOPIC - Abnormal; Notable for the following components:   pH 8.5 (*)    All other components within normal limits  BRAIN NATRIURETIC PEPTIDE - Abnormal; Notable for the following components:   B Natriuretic Peptide 111.4 (*)    All other components within normal limits  CBG MONITORING, ED  TROPONIN I (HIGH SENSITIVITY)  TROPONIN I (HIGH SENSITIVITY)    EKG EKG Interpretation  Date/Time:  Sunday October 31 2019 13:01:51 EDT Ventricular Rate:  55 PR Interval:  186 QRS Duration: 86 QT Interval:  426 QTC Calculation: 407 R Axis:   2 Text Interpretation: Sinus bradycardia Low voltage QRS Borderline ECG No significant change since last tracing Confirmed by Wandra Arthurs [phone]) on 10/31/2019 3:01:34 PM   Radiology CT Angio Head W or Wo Contrast  Result Date: 10/31/2019 CLINICAL DATA:  Dizziness, nonspecific. History of cerebral aneurysm. EXAM: CT ANGIOGRAPHY HEAD AND NECK TECHNIQUE: Multidetector CT imaging of the head and neck was performed using the standard protocol during bolus administration of intravenous contrast. Multiplanar CT image reconstructions and MIPs were obtained to evaluate the vascular anatomy. Carotid stenosis measurements (when applicable) are obtained utilizing NASCET criteria, using the distal internal carotid diameter as the denominator. CONTRAST:  141mL OMNIPAQUE IOHEXOL 350 MG/ML SOLN COMPARISON:  Head CT 09/11/2019.  CT angiogram head 09/04/2019 FINDINGS: CT HEAD Brain: Cerebral volume is normal for age. There is no acute intracranial hemorrhage. No demarcated cortical  infarct. No extra-axial fluid collection. No  evidence of intracranial mass. No midline shift. Vascular: Reported below. Skull: Normal. Negative for fracture or focal lesion. Sinuses: No significant paranasal sinus disease or mastoid effusion Orbits: No acute finding. CTA NECK FINDINGS Aortic arch: Common origin of the innominate and left common carotid arteries. Mild calcified plaque within the proximal major branch vessels of the neck. No hemodynamically significant innominate or proximal subclavian artery stenosis. Right carotid system: CCA and ICA patent within the neck without significant stenosis (50% or greater). Mild mixed plaque within the proximal ICA. Left carotid system: Streak artifact from a dense left-sided contrast bolus limits evaluation of the origin of the left common carotid artery. Within this limitation, the common and internal carotid arteries are patent within the neck without stenosis Vertebral arteries: Codominant and patent within the neck without hemodynamically significant stenosis. Skeleton: No acute bony abnormality or aggressive osseous lesion. Cervical spondylosis. Sequela of prior C4-C6 ACDF. Other neck: No neck mass or cervical lymphadenopathy. Thyroid unremarkable. Upper chest: No consolidation within the imaged lung apices. Subcentimeter calcified granuloma within the right lung apex Review of the MIP images confirms the above findings CTA HEAD FINDINGS Anterior circulation: The intracranial internal carotid arteries are patent. Unchanged size and appearance of a 5 mm right paraophthalmic ICA aneurysm (series 11, image 225). The M1 middle cerebral arteries are patent without significant stenosis. No M2 proximal branch occlusion or high-grade proximal stenosis is identified. The anterior cerebral arteries are patent. Posterior circulation: The intracranial vertebral arteries are patent. Mild atherosclerotic narrowing of the distal V4 segments bilaterally. The basilar artery is developmentally diminutive, but patent.  Hypoplastic P1 right posterior cerebral artery. Sizable right posterior communicating artery. The left posterior communicating artery is hypoplastic or absent. Posterior cerebral is are patent bilaterally without high-grade proximal stenosis. Venous sinuses: Within limitations of contrast timing, no convincing thrombus. Anatomic variants: As described Review of the MIP images confirms the above findings IMPRESSION: CT head: No evidence of acute intracranial abnormality. CTA neck: Streak artifact from a dense left-sided contrast bolus limits evaluation of the origin of the left common carotid artery. Within this limitation, the bilateral common carotid, internal carotid and vertebral arteries are patent within the neck without hemodynamically significant stenosis. Mild mixed plaque within the proximal right ICA. CTA head: 1. No intracranial large vessel occlusion or proximal high-grade arterial stenosis. 2. Mild atherosclerotic narrowing of the distal V4 vertebral arteries bilaterally. 3. Developmentally diminutive basilar artery. 4. Unchanged size and appearance of a 5 mm right paraophthalmic ICA aneurysm Electronically Signed   By: Kellie Simmering DO   On: 10/31/2019 16:24   DG Chest 2 View  Result Date: 10/31/2019 CLINICAL DATA:  Chest pain and palpitations for 2 days, headache, LEFT arm weakness, head pressure and lightheadedness, former smoker EXAM: CHEST - 2 VIEW COMPARISON:  07/10/2019 FINDINGS: Normal heart size, mediastinal contours, and pulmonary vascularity. Lungs clear. No pleural effusion or pneumothorax. Prior cervical spine fusion. IMPRESSION: No acute abnormalities. Electronically Signed   By: Lavonia Dana M.D.   On: 10/31/2019 16:42   CT Angio Neck W and/or Wo Contrast  Result Date: 10/31/2019 CLINICAL DATA:  Dizziness, nonspecific. History of cerebral aneurysm. EXAM: CT ANGIOGRAPHY HEAD AND NECK TECHNIQUE: Multidetector CT imaging of the head and neck was performed using the standard protocol  during bolus administration of intravenous contrast. Multiplanar CT image reconstructions and MIPs were obtained to evaluate the vascular anatomy. Carotid stenosis measurements (when applicable) are obtained utilizing NASCET criteria, using the distal  internal carotid diameter as the denominator. CONTRAST:  153mL OMNIPAQUE IOHEXOL 350 MG/ML SOLN COMPARISON:  Head CT 09/11/2019.  CT angiogram head 09/04/2019 FINDINGS: CT HEAD Brain: Cerebral volume is normal for age. There is no acute intracranial hemorrhage. No demarcated cortical infarct. No extra-axial fluid collection. No evidence of intracranial mass. No midline shift. Vascular: Reported below. Skull: Normal. Negative for fracture or focal lesion. Sinuses: No significant paranasal sinus disease or mastoid effusion Orbits: No acute finding. CTA NECK FINDINGS Aortic arch: Common origin of the innominate and left common carotid arteries. Mild calcified plaque within the proximal major branch vessels of the neck. No hemodynamically significant innominate or proximal subclavian artery stenosis. Right carotid system: CCA and ICA patent within the neck without significant stenosis (50% or greater). Mild mixed plaque within the proximal ICA. Left carotid system: Streak artifact from a dense left-sided contrast bolus limits evaluation of the origin of the left common carotid artery. Within this limitation, the common and internal carotid arteries are patent within the neck without stenosis Vertebral arteries: Codominant and patent within the neck without hemodynamically significant stenosis. Skeleton: No acute bony abnormality or aggressive osseous lesion. Cervical spondylosis. Sequela of prior C4-C6 ACDF. Other neck: No neck mass or cervical lymphadenopathy. Thyroid unremarkable. Upper chest: No consolidation within the imaged lung apices. Subcentimeter calcified granuloma within the right lung apex Review of the MIP images confirms the above findings CTA HEAD FINDINGS  Anterior circulation: The intracranial internal carotid arteries are patent. Unchanged size and appearance of a 5 mm right paraophthalmic ICA aneurysm (series 11, image 225). The M1 middle cerebral arteries are patent without significant stenosis. No M2 proximal branch occlusion or high-grade proximal stenosis is identified. The anterior cerebral arteries are patent. Posterior circulation: The intracranial vertebral arteries are patent. Mild atherosclerotic narrowing of the distal V4 segments bilaterally. The basilar artery is developmentally diminutive, but patent. Hypoplastic P1 right posterior cerebral artery. Sizable right posterior communicating artery. The left posterior communicating artery is hypoplastic or absent. Posterior cerebral is are patent bilaterally without high-grade proximal stenosis. Venous sinuses: Within limitations of contrast timing, no convincing thrombus. Anatomic variants: As described Review of the MIP images confirms the above findings IMPRESSION: CT head: No evidence of acute intracranial abnormality. CTA neck: Streak artifact from a dense left-sided contrast bolus limits evaluation of the origin of the left common carotid artery. Within this limitation, the bilateral common carotid, internal carotid and vertebral arteries are patent within the neck without hemodynamically significant stenosis. Mild mixed plaque within the proximal right ICA. CTA head: 1. No intracranial large vessel occlusion or proximal high-grade arterial stenosis. 2. Mild atherosclerotic narrowing of the distal V4 vertebral arteries bilaterally. 3. Developmentally diminutive basilar artery. 4. Unchanged size and appearance of a 5 mm right paraophthalmic ICA aneurysm Electronically Signed   By: Kellie Simmering DO   On: 10/31/2019 16:24    Procedures Procedures (including critical care time)  Medications Ordered in ED Medications  famotidine (PEPCID) IVPB 20 mg premix (0 mg Intravenous Stopped 10/31/19 1733)   metoCLOPramide (REGLAN) injection 10 mg (10 mg Intravenous Given 10/31/19 1532)  diphenhydrAMINE (BENADRYL) injection 12.5 mg (12.5 mg Intravenous Given 10/31/19 1531)  iohexol (OMNIPAQUE) 350 MG/ML injection 100 mL (100 mLs Intravenous Contrast Given 10/31/19 1553)  potassium chloride SA (KLOR-CON) CR tablet 40 mEq (40 mEq Oral Given 10/31/19 1737)    ED Course  I have reviewed the triage vital signs and the nursing notes.  Pertinent labs & imaging results that were available during  my care of the patient were reviewed by me and considered in my medical decision making (see chart for details).    MDM Rules/Calculators/A&P                         Stephanie Parks is a 74 y.o. female here with a constellation of complaints.  Patient has palpitations and dizziness and left arm weakness and some chest pain.  Most of the symptoms are acute on chronic.  She had multiple evaluations previously.  She had negative MRIs as well as CTA that showed an aneurysm that stable.  I am not quite clear if she has an acute issue right now.  She has normal neuro exam right now.  We will get a CTA to rule out ruptured aneurysm but she does not need MRI brain currently.  Given that she had chest pain today we will get troponin x2.  If work-up is negative she has follow-up with PCP this week.   6:20 PM Troponin negative x2.  CTA showed stable 5 mm cerebral aneurysm without any rupture .  Headache improved with migraine cocktail.  I wonder if she has a complex migraine. She has neurologist that she can follow-up with.  She also has PCP follow-up this week.  Final Clinical Impression(s) / ED Diagnoses Final diagnoses:  None    Rx / DC Orders ED Discharge Orders    None       Drenda Freeze, MD 10/31/19 1821

## 2019-10-31 NOTE — ED Notes (Signed)
Patient transported to CT 

## 2019-10-31 NOTE — Discharge Instructions (Addendum)
Your CT today showed a stable aneurysm.  Your lab work and your heart enzymes are normal right now.  Please follow-up with your primary care doctor as scheduled  You should also consider following up with your neurologist  Return to ER if you have worse headaches, palpitations, numbness, weakness.

## 2019-10-31 NOTE — ED Notes (Signed)
Pt reporting no symptoms at time of assessment.

## 2019-10-31 NOTE — Progress Notes (Deleted)
[place] at [facility] [address], [place], [place] [address] [phone]  Date:  11/03/2019   Name:  Stephanie Parks   DOB:  10/18/1945   MRN:  [id number]  PCP:  [name], MD    Chief Complaint: No chief complaint on file.   History of Present Illness:  Stephanie Parks is a 74 y.o. very pleasant female patient who presents with the following:  Patient here today for follow-up visit- History of hypertension, CAD, brain aneurysm, GERD, sleep apnea on CPAP Last seen by myself in August.  At that time she had concern of her eyes being swollen and a left-sided headache for 1 year.    She went to see her eye doctor, Dr. Berdine Addison recently, he referred her to ear nose and throat  She then appeared at the ER Sunday, 9/12 with several complaints Stephanie Parks is a 74 y.o. female here with a constellation of complaints.  Patient has palpitations and dizziness and left arm weakness and some chest pain.  Most of the symptoms are acute on chronic.  She had multiple evaluations previously.  She had negative MRIs as well as CTA that showed an aneurysm that stable.  I am not quite clear if she has an acute issue right now.  She has normal neuro exam right now.  We will get a CTA to rule out ruptured aneurysm but she does not need MRI brain currently.  Given that she had chest pain today we will get troponin x2.  If work-up is negative she has follow-up with PCP this week.  6:20 PM Troponin negative x2.  CTA showed stable 5 mm cerebral aneurysm without any rupture .  Headache improved with migraine cocktail.  I wonder if she has a complex migraine. She has neurologist that she can follow-up with.  She also has PCP follow-up this week.  Her potassium has been somewhat low since July, likely related to her diuretic use Patient Active Problem List   Diagnosis Date Noted  . Seasonal allergic conjunctivitis 10/04/2019  . Dysfunction of right  eustachian tube 10/04/2019  . Pre-diabetes 06/02/2019  . Seasonal and perennial allergic rhinitis 12/22/2018  . Heart palpitations 12/22/2018  . History of chest pain 07/20/2018  . Brain aneurysm 07/20/2018  . Bronchitis 04/23/2018  . Tendonitis, Achilles, right 12/08/2017  . Onychomycosis 12/08/2017  . Unilateral primary osteoarthritis, left knee 10/07/2017  . Ingrown toenail 08/10/2017  . Coughing 04/24/2017  . CAD (coronary artery disease) 03/01/2015  . Aneurysm ([place]) 03/01/2015  . Dizziness 03/01/2015  . Paresthesia of arm 03/01/2015  . Allergic rhinitis 03/01/2015  . Achalasia 09/28/2012  . CN (constipation) 09/28/2012  . Cephalalgia 08/24/2012  . HTN (hypertension) 04/19/2011  . Hyperlipidemia 04/19/2011  . GERD (gastroesophageal reflux disease) 04/19/2011  . Chest pain 04/04/2011    Past Medical History:  Diagnosis Date  . 3-vessel CAD 03/01/2015  . Achalasia 09/28/2012  . Allergic rhinitis 03/01/2015  . Anemia   . Aneurysm ([place]) 03/01/2015  . BP (high blood pressure) 03/01/2015  . Bronchitis 04/23/2018  . Cephalalgia 08/24/2012   Overview:  ICD-10 cut over    . Chest pain 04/04/2011  . CN (constipation) 09/28/2012  . Coughing 04/24/2017  . Decreased potassium in the blood 03/01/2015  . Diverticulosis   . Dizziness 03/01/2015  . GERD (gastroesophageal reflux disease)   . Hiatal hernia   . Hypercholesterolemia 03/01/2015  . Hyperlipidemia   .  Hypertension   . Ingrown toenail 08/10/2017  . Inguinal hernia   . Migraine headache   . Onychomycosis 12/08/2017  . Paresthesia of arm 03/01/2015  . Schatzki's ring   . Tendonitis, Achilles, right 12/08/2017  . Unilateral primary osteoarthritis, left knee 10/07/2017    Past Surgical History:  Procedure Laterality Date  . ABDOMINAL HYSTERECTOMY    . BACK SURGERY     x 3  . CARPAL TUNNEL RELEASE     left wrist  . CERVICAL SPINE SURGERY    . HEMORRHOID SURGERY      Social History   Tobacco Use  . Smoking status: Former  Smoker    Packs/day: 0.50    Years: 20.00    Pack years: 10.00    Types: Cigarettes    Quit date: 02/19/1984    Years since quitting: 35.7  . Smokeless tobacco: Never Used  Vaping Use  . Vaping Use: Never used  Substance Use Topics  . Alcohol use: No  . Drug use: No    Family History  Problem Relation Age of Onset  . Allergic rhinitis Sister   . Diabetes Sister   . Hypertension Sister   . Heart attack Father 39  . Heart disease Father   . Stomach cancer Maternal Grandmother   . Heart disease Mother   . Colon cancer Neg Hx   . Angioedema Neg Hx   . Asthma Neg Hx   . Eczema Neg Hx   . Urticaria Neg Hx   . Immunodeficiency Neg Hx   . Breast cancer Neg Hx   . Migraines Neg Hx   . Headache Neg Hx     Allergies  Allergen Reactions  . Shellfish Allergy Hives and Swelling  . Ace Inhibitors Other (See Comments)    Pt cannot recall her reaction but tolerates arb   . Pitavastatin   . Topiramate     Heart race,   . Sulfa Antibiotics Other (See Comments) and Rash    Fine bumps    Medication list has been reviewed and updated.  Current Outpatient Medications on File Prior to Visit  Medication Sig Dispense Refill  . aspirin 81 MG tablet Take 81 mg by mouth daily.    . Azelastine HCl 0.15 % SOLN Place 1-2 sprays into both nostrils 2 (two) times daily. 30 mL 5  . Calcium Carbonate (CALCIUM 600 PO) Take 1 tablet by mouth daily.    Marland Kitchen EPINEPHrine (EPIPEN 2-PAK) 0.3 mg/0.3 mL IJ SOAJ injection Inject 0.3 mLs (0.3 mg total) into the muscle as needed for anaphylaxis. Per allergen immunotherapy protocol 1 each 2  . fluticasone (FLONASE) 50 MCG/ACT nasal spray USE 2 SPRAYS IN NOSTRIL(S) DAILY AS NEEDED.  In the right nostril point the applicator out toward your right ear.  In the left nostril point the applicator out toward her left ear 16 g 9  . KLOR-CON M20 20 MEQ tablet TAKE 1 TABLET BY MOUTH EVERY DAY 90 tablet 1  . Lidocaine 1.8 % PTCH Apply 1 patch topically daily. (Patient not  taking: Reported on 10/04/2019) 7 patch 1  . losartan-hydrochlorothiazide (HYZAAR) 100-25 MG tablet Take 1 tablet by mouth daily. 90 tablet 3  . metoprolol succinate (TOPROL-XL) 25 MG 24 hr tablet Take 0.5 tablets (12.5 mg total) by mouth daily. 90 tablet 1  . Multiple Vitamins-Minerals (MULTIVITAL) tablet Take by mouth.    . NONFORMULARY OR COMPOUNDED ITEM Allergen immunotherapy 2 each 1  . Olopatadine HCl 0.2 % SOLN Apply 1  drop to eye daily as needed. 2.5 mL 1  . pantoprazole (PROTONIX) 40 MG tablet TAKE 1 TABLET BY MOUTH DAILY. TAKE 30-60 MINUTES BEFORE BREAKFAST. (Patient not taking: Reported on 10/04/2019) 90 tablet 2  . rifaximin (XIFAXAN) 550 MG TABS tablet Take 1 tablet (550 mg total) by mouth 3 (three) times daily. (Patient not taking: Reported on 10/04/2019) 42 tablet 0  . rosuvastatin (CRESTOR) 20 MG tablet Take 20 mg by mouth daily.    Marland Kitchen trimethoprim-polymyxin b (POLYTRIM) ophthalmic solution Place 1 drop into both eyes every 4 (four) hours. Use while awake for 5-7 days 10 mL 0   No current facility-administered medications on file prior to visit.    Review of Systems:  ***  Physical Examination: There were no vitals filed for this visit. There were no vitals filed for this visit. There is no height or weight on file to calculate BMI. Ideal Body Weight:    ***  Assessment and Plan: ***  Signed Lamar Blinks, MD

## 2019-10-31 NOTE — ED Triage Notes (Signed)
Patient states that for the last couple of days she has had dizziness, left arm weakness, headache with pressure in her head, and lightheaded feelings. The patient reports that she feels like her heart is racing as well

## 2019-11-03 ENCOUNTER — Telehealth: Payer: Self-pay | Admitting: Internal Medicine

## 2019-11-03 ENCOUNTER — Ambulatory Visit (INDEPENDENT_AMBULATORY_CARE_PROVIDER_SITE_OTHER): Payer: Medicare Other | Admitting: Family Medicine

## 2019-11-03 DIAGNOSIS — R200 Anesthesia of skin: Secondary | ICD-10-CM | POA: Diagnosis not present

## 2019-11-03 DIAGNOSIS — G518 Other disorders of facial nerve: Secondary | ICD-10-CM | POA: Diagnosis not present

## 2019-11-03 DIAGNOSIS — M316 Other giant cell arteritis: Secondary | ICD-10-CM | POA: Diagnosis not present

## 2019-11-03 DIAGNOSIS — Z538 Procedure and treatment not carried out for other reasons: Secondary | ICD-10-CM

## 2019-11-03 DIAGNOSIS — G4489 Other headache syndrome: Secondary | ICD-10-CM | POA: Diagnosis not present

## 2019-11-03 NOTE — Telephone Encounter (Signed)
Pt called to inform that samples that she was given to try and pantoprazole are not working. Her acid reflux is very bad. She would like some advise.

## 2019-11-04 NOTE — Telephone Encounter (Signed)
Left message for pt to call back  °

## 2019-11-05 NOTE — Telephone Encounter (Signed)
Pt states her reflux meds are not helping, she has tried several different meds and still having problems. Also states every time she eats something it seems to run right through her and she has diarrhea. Pt scheduled to see Dr. Norman Herrlich 11/10/19@9 :10am. Pt aware of appt.

## 2019-11-08 NOTE — Progress Notes (Addendum)
[place] at [facility] [address], [place], [place] [address] [phone]  Date:  11/10/2019   Name:  Stephanie Parks   DOB:  14-Aug-1945   MRN:  [id number]  PCP:  [name], MD    Chief Complaint: Eye puffiness (3 weeks ago, no pain, no discharge) and Tachycardia (shortness of breath, heart feels like its racing, going on 2 weeks, seen by ER)   History of Present Illness:  Stephanie Parks is a 74 y.o. very pleasant female patient who presents with the following:  Patient is here today to follow-up with concern regarding her eyes Last seen by myself in August of this year- from that visit:  Today she notes that "my eyes are swollen" bilaterally, and she has left sided HA which have been present for a year. She has been trying to figure out his head pain, she has seen her neurosurgeon and has been followed with neurology coming up She has noted some apparent swelling around her eyes since this am She had it once last week as well  She wears glasses only, no contacts She sometimes feels like she needs to blink her eyes to clear them out.  They were also a bit sticky this am with some mild discharge No fever   She has been seeing Dr. Berdine Addison with ENT for headaches She was seen by GI this am - Dr Hilarie Fredrickson- with concern of epigastric pain, poor appetite and constipation.  They plan for a CT scan, increase PPI, miralax, probiotic   Seen in ER with palpitations on 9/12-                      Stephanie Parks is a 74 y.o. female here with a constellation of complaints.  Patient has palpitations and dizziness and left arm weakness and some chest pain.  Most of the symptoms are acute on chronic.  She had multiple evaluations previously.  She had negative MRIs as well as CTA that showed an aneurysm that stable.  I am not quite clear if she has an acute issue right now.  She has normal neuro exam right now.  We will get a CTA to rule out  ruptured aneurysm but she does not need MRI brain currently.  Given that she had chest pain today we will get troponin x2.  If work-up is negative she has follow-up with PCP this week  Today pt notes that her heart seems to "be racing, it's every day." She notes this daily for about 2 months.  Will come and go; not associated with eating or exertion, no daily pattern.  It may last 15- 20 minutes.   It may be associated with chest pressure I asked her about anxiety today- she is not sure if this might be associated with her palpitations She notes that she is also SOB when she feels like her heart is racing.  SOB can also occur other times over the last 2 months   She has been seen by [facility] cardiology - most recently June [id number] and also [facility] cardiology back in February . She underwent a cardiac cath, echo and zio patch in January 2021- all reassuring.  Rare PVC on zio  She works as a Scientist, water quality  She had her covid series already Declines a flu shot today  We did orthostatics for her today  Patient Active Problem List   Diagnosis Date Noted  .  Seasonal allergic conjunctivitis 10/04/2019  . Dysfunction of right eustachian tube 10/04/2019  . Pre-diabetes 06/02/2019  . Seasonal and perennial allergic rhinitis 12/22/2018  . Heart palpitations 12/22/2018  . History of chest pain 07/20/2018  . Brain aneurysm 07/20/2018  . Bronchitis 04/23/2018  . Tendonitis, Achilles, right 12/08/2017  . Onychomycosis 12/08/2017  . Unilateral primary osteoarthritis, left knee 10/07/2017  . Ingrown toenail 08/10/2017  . Coughing 04/24/2017  . CAD (coronary artery disease) 03/01/2015  . Aneurysm ([place]) 03/01/2015  . Dizziness 03/01/2015  . Paresthesia of arm 03/01/2015  . Allergic rhinitis 03/01/2015  . Achalasia 09/28/2012  . CN (constipation) 09/28/2012  . Cephalalgia 08/24/2012  . HTN (hypertension) 04/19/2011  . Hyperlipidemia 04/19/2011  . GERD (gastroesophageal reflux disease) 04/19/2011  . Chest pain  04/04/2011    Past Medical History:  Diagnosis Date  . 3-vessel CAD 03/01/2015  . Achalasia 09/28/2012  . Allergic rhinitis 03/01/2015  . Anemia   . Aneurysm ([place]) 03/01/2015  . BP (high blood pressure) 03/01/2015  . Bronchitis 04/23/2018  . Cephalalgia 08/24/2012   Overview:  ICD-10 cut over    . Chest pain 04/04/2011  . CN (constipation) 09/28/2012  . Coughing 04/24/2017  . Decreased potassium in the blood 03/01/2015  . Diverticulosis   . Dizziness 03/01/2015  . GERD (gastroesophageal reflux disease)   . Hiatal hernia   . Hypercholesterolemia 03/01/2015  . Hyperlipidemia   . Hypertension   . Ingrown toenail 08/10/2017  . Inguinal hernia   . Migraine headache   . Onychomycosis 12/08/2017  . Paresthesia of arm 03/01/2015  . Schatzki's ring   . Tendonitis, Achilles, right 12/08/2017  . Unilateral primary osteoarthritis, left knee 10/07/2017    Past Surgical History:  Procedure Laterality Date  . ABDOMINAL HYSTERECTOMY    . BACK SURGERY     x 3  . CARPAL TUNNEL RELEASE     left wrist  . CERVICAL SPINE SURGERY    . HEMORRHOID SURGERY      Social History   Tobacco Use  . Smoking status: Former Smoker    Packs/day: 0.50    Years: 20.00    Pack years: 10.00    Types: Cigarettes    Quit date: 02/19/1984    Years since quitting: 35.7  . Smokeless tobacco: Never Used  Vaping Use  . Vaping Use: Never used  Substance Use Topics  . Alcohol use: No  . Drug use: No    Family History  Problem Relation Age of Onset  . Allergic rhinitis Sister   . Diabetes Sister   . Hypertension Sister   . Heart attack Father 87  . Heart disease Father   . Stomach cancer Maternal Grandmother   . Heart disease Mother   . Colon cancer Neg Hx   . Angioedema Neg Hx   . Asthma Neg Hx   . Eczema Neg Hx   . Urticaria Neg Hx   . Immunodeficiency Neg Hx   . Breast cancer Neg Hx   . Migraines Neg Hx   . Headache Neg Hx     Allergies  Allergen Reactions  . Shellfish Allergy Hives and Swelling   . Ace Inhibitors Other (See Comments)    Pt cannot recall her reaction but tolerates arb   . Pitavastatin   . Topiramate     Heart race,   . Sulfa Antibiotics Other (See Comments) and Rash    Fine bumps    Medication list has been reviewed and updated.  Current Outpatient Medications  on File Prior to Visit  Medication Sig Dispense Refill  . aspirin 81 MG tablet Take 81 mg by mouth daily.    . Azelastine HCl 0.15 % SOLN Place 1-2 sprays into both nostrils 2 (two) times daily. 30 mL 5  . Calcium Carbonate (CALCIUM 600 PO) Take 1 tablet by mouth daily.    Marland Kitchen EPINEPHrine (EPIPEN 2-PAK) 0.3 mg/0.3 mL IJ SOAJ injection Inject 0.3 mLs (0.3 mg total) into the muscle as needed for anaphylaxis. Per allergen immunotherapy protocol 1 each 2  . fluticasone (FLONASE) 50 MCG/ACT nasal spray USE 2 SPRAYS IN NOSTRIL(S) DAILY AS NEEDED.  In the right nostril point the applicator out toward your right ear.  In the left nostril point the applicator out toward her left ear 16 g 9  . KLOR-CON M20 20 MEQ tablet TAKE 1 TABLET BY MOUTH EVERY DAY 90 tablet 1  . losartan-hydrochlorothiazide (HYZAAR) 100-25 MG tablet Take 1 tablet by mouth daily. 90 tablet 3  . metoprolol succinate (TOPROL-XL) 25 MG 24 hr tablet Take 0.5 tablets (12.5 mg total) by mouth daily. 90 tablet 1  . Multiple Vitamins-Minerals (MULTIVITAL) tablet Take by mouth.    . NONFORMULARY OR COMPOUNDED ITEM Allergen immunotherapy 2 each 1  . rosuvastatin (CRESTOR) 20 MG tablet Take 20 mg by mouth daily.     No current facility-administered medications on file prior to visit.    Review of Systems:  As per HPI- otherwise negative.   Physical Examination: Vitals:   11/10/19 1343  BP: (!) 112/50  Pulse: (!) 103  Resp: 17  SpO2: 97%   Vitals:   11/10/19 1343  Weight: 206 lb (93.4 kg)  Height: 5\' 5"  (1.651 m)   Body mass index is 34.28 kg/m. Ideal Body Weight: Weight in (lb) to have BMI = 25: 149.9  Supine 118/54  72 Sitting110/50   82 Standing   110/50  92  GEN: no acute distress.  Obese, otherwise looks well  HEENT: Atraumatic, Normocephalic.  Ears and Nose: No external deformity. CV: RRR, No M/G/R. No JVD. No thrill. No extra heart sounds. PULM: CTA B, no wheezes, crackles, rhonchi. No retractions. No resp. distress. No accessory muscle use. ABD: S, NT, ND, +BS. No rebound. No HSM. EXTR: No c/c.  Trace edema of both LE- most likely c/w venous insufficiency  PSYCH: Normally interactive. Conversant.   BP Readings from Last 3 Encounters:  11/10/19 (!) 112/50  11/10/19 (!) 148/72  10/31/19 (!) 148/73   Pulse Readings from Last 3 Encounters:  11/10/19 (!) 103  11/10/19 72  10/31/19 (!) 56   Wt Readings from Last 3 Encounters:  11/10/19 206 lb (93.4 kg)  11/10/19 207 lb 6.4 oz (94.1 kg)  10/31/19 208 lb (94.3 kg)   Thyroid normal in May K was a bit low 9/12  Assessment and Plan: Hypokalemia - Plan: Basic metabolic panel, CANCELED: Basic metabolic panel  Paroxysmal supraventricular tachycardia ([place]) - Plan: TSH, CANCELED: TSH  Pt here today with concern of palpitations  This has bothered her daily for 2-3 months; I wonder if longer as she did have a zio patch in January of this year.  She is very frustrated and upset that "no one has found what is wrong with me."   I do wonder how much anxiety is playing into her symptoms Will also check her TSH and K level today   Noted that her BP is borderline low today Will try increasing her dose of toprol xl from 12.5 to  25 mg to reduce tachycardia sx, and drop her current losartan/hctz by 50% to avoid dropping her BP Pt also has complaint of swelling around her eyes. I cannot appreciate a definite abnormality on her exam- I apologized that I do not have an explanation for this finding.  She has seen optho and is following up with them soon  Of note, her renal function has been normal, BNP essentially normal She also notes trace edema of both LE which I explained is  quite common in people in their 70s, would suggest wearing compression socks if she is bothered by this   Message to her cardiologist requesting an appt for her  Signed Lamar Blinks, MD  Addendum 9/23, received her labs as below.  Message to patient  Results for orders placed or performed in visit on 34/03/70  Basic metabolic panel  Result Value Ref Range   Glucose, Bld 143 (H) 65 - 99 mg/dL   BUN 15 7 - 25 mg/dL   Creat 0.95 (H) 0.60 - 0.93 mg/dL   BUN/Creatinine Ratio 16 6 - 22 (calc)   Sodium 144 135 - 146 mmol/L   Potassium 3.3 (L) 3.5 - 5.3 mmol/L   Chloride 103 98 - 110 mmol/L   CO2 33 (H) 20 - 32 mmol/L   Calcium 9.6 8.6 - 10.4 mg/dL  TSH  Result Value Ref Range   TSH 2.64 0.40 - 4.50 mIU/L

## 2019-11-10 ENCOUNTER — Other Ambulatory Visit: Payer: Self-pay

## 2019-11-10 ENCOUNTER — Ambulatory Visit (INDEPENDENT_AMBULATORY_CARE_PROVIDER_SITE_OTHER): Payer: Medicare Other | Admitting: Family Medicine

## 2019-11-10 ENCOUNTER — Encounter: Payer: Self-pay | Admitting: Internal Medicine

## 2019-11-10 ENCOUNTER — Ambulatory Visit: Payer: Medicare Other | Admitting: Internal Medicine

## 2019-11-10 ENCOUNTER — Other Ambulatory Visit: Payer: Self-pay | Admitting: *Deleted

## 2019-11-10 ENCOUNTER — Encounter: Payer: Self-pay | Admitting: Family Medicine

## 2019-11-10 VITALS — BP 112/50 | HR 90 | Resp 17 | Ht 65.0 in | Wt 206.0 lb

## 2019-11-10 VITALS — BP 148/72 | HR 72 | Ht 65.0 in | Wt 207.4 lb

## 2019-11-10 DIAGNOSIS — K219 Gastro-esophageal reflux disease without esophagitis: Secondary | ICD-10-CM

## 2019-11-10 DIAGNOSIS — E876 Hypokalemia: Secondary | ICD-10-CM | POA: Diagnosis not present

## 2019-11-10 DIAGNOSIS — R002 Palpitations: Secondary | ICD-10-CM | POA: Diagnosis not present

## 2019-11-10 DIAGNOSIS — R140 Abdominal distension (gaseous): Secondary | ICD-10-CM | POA: Diagnosis not present

## 2019-11-10 DIAGNOSIS — K5909 Other constipation: Secondary | ICD-10-CM

## 2019-11-10 DIAGNOSIS — R630 Anorexia: Secondary | ICD-10-CM

## 2019-11-10 DIAGNOSIS — I471 Supraventricular tachycardia: Secondary | ICD-10-CM | POA: Diagnosis not present

## 2019-11-10 DIAGNOSIS — R1013 Epigastric pain: Secondary | ICD-10-CM | POA: Diagnosis not present

## 2019-11-10 DIAGNOSIS — R600 Localized edema: Secondary | ICD-10-CM | POA: Diagnosis not present

## 2019-11-10 MED ORDER — PANTOPRAZOLE SODIUM 40 MG PO TBEC
40.0000 mg | DELAYED_RELEASE_TABLET | Freq: Two times a day (BID) | ORAL | 1 refills | Status: DC
Start: 2019-11-10 — End: 2020-05-10

## 2019-11-10 NOTE — Patient Instructions (Addendum)
Please follow up with Dr Pyrtle in 3 months in the office. ____________________________________________________________  We have sent the following medications to your pharmacy for you to pick up at your convenience: Pantoprazole 80 mg once daily _____________________________________________________________  You have been scheduled for a CT scan of the abdomen and pelvis at [facility] (1st floor).   You are scheduled on 11/18/19 at 3:30 pm. You should arrive 15 minutes prior to your appointment time for registration. Please follow the written instructions below on the day of your exam:  WARNING: IF YOU ARE ALLERGIC TO IODINE/X-RAY DYE, PLEASE NOTIFY RADIOLOGY IMMEDIATELY AT [phone]! YOU WILL BE GIVEN A 13 HOUR PREMEDICATION PREP.  1) Do not eat or drink anything after 11:30 am (4 hours prior to your test) 2) You have been given 2 bottles of oral contrast to drink. The solution may taste better if refrigerated, but do NOT add ice or any other liquid to this solution. Shake well before drinking.    Drink 1 bottle of contrast @ 1:30 pm (2 hours prior to your exam)  Drink 1 bottle of contrast @ 2:30 pm (1 hour prior to your exam)  You may take any medications as prescribed with a small amount of water, if necessary. If you take any of the following medications: METFORMIN, GLUCOPHAGE, GLUCOVANCE, AVANDAMET, RIOMET, FORTAMET, ACTOPLUS MET, JANUMET, GLUMETZA or METAGLIP, you MAY be asked to HOLD this medication 48 hours AFTER the exam.  The purpose of you drinking the oral contrast is to aid in the visualization of your intestinal tract. The contrast solution may cause some diarrhea. Depending on your individual set of symptoms, you may also receive an intravenous injection of x-ray contrast/dye. Plan on being at [facility] for 30 minutes or longer, depending on the type of exam you are having performed.  This test typically takes 30-45 minutes to complete.  If you have any  questions regarding your exam or if you need to reschedule, you may call the CT department at [phone] between the hours of 8:00 am and 5:00 pm, Monday-Friday.  _____________________________________________________________________  Discontinue your over the counter laxative.  ___________________________________________________________________  Please purchase the following medications over the counter and take as directed: Miralax 1 capful (17 grams) once daily dissolved in at least 8 ounces water/juice  __________________________________________________________________  You may continue the probiotic which you recently started.  ___________________________________________________________________  If you are age 65 or older, your body mass index should be between 23-30. Your Body mass index is 34.51 kg/m. If this is out of the aforementioned range listed, please consider follow up with your Primary Care Provider.  If you are age 64 or younger, your body mass index should be between 19-25. Your Body mass index is 34.51 kg/m. If this is out of the aformentioned range listed, please consider follow up with your Primary Care Provider.   __________________________________________________________________  Due to recent changes in healthcare laws, you may see the results of your imaging and laboratory studies on MyChart before your provider has had a chance to review them.  We understand that in some cases there may be results that are confusing or concerning to you. Not all laboratory results come back in the same time frame and the provider may be waiting for multiple results in order to interpret others.  Please give us 48 hours in order for your provider to thoroughly review all the results before contacting the office for clarification of your results.    

## 2019-11-10 NOTE — Patient Instructions (Addendum)
Please decrease your losartan/hctz back to 50/12.5 as your BP is slightly low Let's increase your metoprolol to a whole tablet - 25 mg- once a day.  I hope that this will help reduce palpitations   I will check your potassium and thyroid today I will touch base with your cardiologist about your symptoms as well; likely he will want to get you in for a visit  Thank you for getting your covid 19 vaccine!  I would also encourage you to get a booster dose 6 months after your 2nd dose   Please let me know if any change or worsening of your symptoms

## 2019-11-10 NOTE — Progress Notes (Signed)
Subjective:    Patient ID: Stephanie Parks, female    DOB: [birth year], 74 y.o.   MRN: [id number]  HPI Stephanie Parks is a 74 year old female with a history of chronic constipation, diverticulosis with history of diverticulitis in February 2020, history of adenomatous colon polyps, GERD with small hiatal hernia who is here for follow-up.  She was last seen in our office on 03/10/2019.  She is here alone today.  Her last EGD and colonoscopy were in January 2020.  At colonoscopy 3 subcentimeter polyps removed, there was sigmoid diverticulosis.  Polyps were adenomatous.  EGD was normal with the exception of a small 1 to 2 cm hiatal hernia.  She reports that she continues to struggle with acid reflux, but now poor appetite.  She still having issues with constipation but at times when she takes laxatives she will develop diarrhea.  She has lost 10 pounds.  She is lost the desire to eat.  Eating does not necessarily cause pain.  Her abdominal pain is really more in the epigastrium just below her breastbone and at times it feels like food "just sits there".  She does have belching but not a lot of actual heartburn.  She reports without laxative she will not have regular bowel movements and so she has tried senna as well as milk of magnesia.  She also at times has a foul odor from the rectum.  No rectal bleeding or melena.  2 days ago she started a probiotic and thinks this may slightly be helping.  Linzess caused diarrhea in the past.  Motegrity was suggested but not covered by insurance so never tried.  Amitiza was cost prohibitive.  Of note she did call our office about a month ago with diarrhea at that time and we checked GI pathogen panel which was negative.  We treated her with 2 weeks of rifaximin 550 mg 3 times daily empirically for SIBO.  The bloating may be slightly better with this but no change in bowel habit or improvement in abdominal pain.  Review of Systems As per HPI, otherwise  negative  Current Medications, Allergies, Past Medical History, Past Surgical History, Family History and Social History were reviewed in [employer] record.     Objective:   Physical Exam Ht _0  (1.651 m)   Wt 207 lb 6.4 oz (94.1 kg)   BMI 34.51 kg/m  Gen: awake, alert, NAD HEENT: anicteric CV: RRR, no mrg Pulm: CTA b/l Abd: soft, tender in the upper epigastrium without rebound or guarding, nondistended, +BS throughout Ext: no c/c/e Neuro: nonfocal  GI path profile neg Aug 2021  CBC    Component Value Date/Time   WBC 8.1 10/31/2019 1502   RBC 4.14 10/31/2019 1502   HGB 11.9 (L) 10/31/2019 1502   HCT 36.5 10/31/2019 1502   PLT 323 10/31/2019 1502   MCV 88.2 10/31/2019 1502   MCH 28.7 10/31/2019 1502   MCHC 32.6 10/31/2019 1502   RDW 14.6 10/31/2019 1502   LYMPHSABS 2.7 12/22/2018 1719   MONOABS 0.5 12/22/2018 1719   EOSABS 0.2 12/22/2018 1719   BASOSABS 0.0 12/22/2018 1719   CMP     Component Value Date/Time   NA 140 10/31/2019 1502   NA 143 07/30/2019 1614   K 3.1 (L) 10/31/2019 1502   CL 100 10/31/2019 1502   CO2 29 10/31/2019 1502   GLUCOSE 100 (H) 10/31/2019 1502   BUN 10 10/31/2019 1502   BUN 14 07/30/2019 1614  CREATININE 0.79 10/31/2019 1502   CALCIUM 9.6 10/31/2019 1502   PROT 7.0 09/11/2019 1400   PROT 6.2 07/28/2018 0756   ALBUMIN 3.7 09/11/2019 1400   ALBUMIN 4.0 07/28/2018 0756   AST 23 09/11/2019 1400   ALT 16 09/11/2019 1400   ALKPHOS 62 09/11/2019 1400   BILITOT 0.6 09/11/2019 1400   BILITOT 0.4 07/28/2018 0756   GFRNONAA >60 10/31/2019 1502   GFRAA >60 10/31/2019 1502    CRP < 0.1, ESR 25      Assessment & Plan:  74 year old female with a history of chronic constipation, diverticulosis with history of diverticulitis in February 2020, history of adenomatous colon polyps, GERD with small hiatal hernia who is here for follow-up.  1.  Epigastric pain/poor appetite/weight loss --she was previously on higher  dose pantoprazole.  Upper endoscopy was unremarkable when done last year.  Proceed as follows --CT scan of the abdomen pelvis with contrast --Increase pantoprazole to 40 mg twice daily, this dose was more effective when used previously, see last office note  2.  Constipation with occasional what sounds like overflow diarrhea --ongoing issue for her.  Prescription laxatives has been cost prohibitive.  I think she needs to try MiraLAX again but on a more regular basis and give it time to work effectively.  Probiotic is worth a shot will likely not hurt --Discontinue over-the-counter laxatives other than MiraLAX to be taken 17 g a day --Continue daily probiotic --Follow-up CT above to exclude other pathology  72-monthfollow-up

## 2019-11-11 ENCOUNTER — Encounter: Payer: Self-pay | Admitting: Family Medicine

## 2019-11-11 LAB — BASIC METABOLIC PANEL
BUN/Creatinine Ratio: 16 (calc) (ref 6–22)
BUN: 15 mg/dL (ref 7–25)
CO2: 33 mmol/L — ABNORMAL HIGH (ref 20–32)
Calcium: 9.6 mg/dL (ref 8.6–10.4)
Chloride: 103 mmol/L (ref 98–110)
Creat: 0.95 mg/dL — ABNORMAL HIGH (ref 0.60–0.93)
Glucose, Bld: 143 mg/dL — ABNORMAL HIGH (ref 65–99)
Potassium: 3.3 mmol/L — ABNORMAL LOW (ref 3.5–5.3)
Sodium: 144 mmol/L (ref 135–146)

## 2019-11-11 LAB — TSH: TSH: 2.64 mIU/L (ref 0.40–4.50)

## 2019-11-12 ENCOUNTER — Telehealth: Payer: Self-pay

## 2019-11-12 ENCOUNTER — Telehealth: Payer: Self-pay | Admitting: Radiology

## 2019-11-12 DIAGNOSIS — R002 Palpitations: Secondary | ICD-10-CM

## 2019-11-12 NOTE — Telephone Encounter (Signed)
Enrolled patient for a 14 day Zio XT Monitor to be mailed to patients home  

## 2019-11-12 NOTE — Telephone Encounter (Signed)
Called and spoke to patient. She has had increased palpitations and dizziness. Made patient aware that we will order a 14 day ZIO. She verbalized understanding and thanked me for the call.

## 2019-11-12 NOTE — Telephone Encounter (Signed)
-----   Message from Jettie Booze, MD sent at 11/10/2019 11:03 PM EDT ----- If she is having a lot of sx, we can do a 14 day Zio patch.  JV ----- Message ----- From: [name], MD Sent: 11/10/2019   6:09 PM EDT To: Jettie Booze, MD  [facility] saw Stephanie Parks today.  She continues to be very bothered by palpitations/ heart racing.  I increased her BB dose a bit and am checking her thyroid.  Would your office be able to bring her in for follow-up soon? I do wonder how much anxiety is playing into her symptoms  Thank you! [place]

## 2019-11-15 ENCOUNTER — Other Ambulatory Visit: Payer: Self-pay | Admitting: *Deleted

## 2019-11-15 NOTE — Patient Outreach (Signed)
[facility]) Care Management  11/15/2019  RUCHA WISSINGER 1945/07/14 [id number]  Telephone outreach and follow up for chronic disease management. Left message and requested a return call.  Since last conversation, pt has had an ED visit for palpitations, f/u with GI and primary care.  Dr. Edilia Bo has rechecked her potassium (3.3) and has reduced pt's Hyzaar to 50/12.5 daily to hopefully allow her potassium level to come up. Her metoprolol XL was increased to 25 mg to hopefully reduce palpitations. Another Zio monitor has been ordered for home application. Dr. Aletha Halim f/u on 11/19/19.  Dr. Hilarie Fredrickson increased pantoprozole to 40 mg bid and has ordered a CT of her abdomen.  [name]. [name], MSN, [facility] Gerontological Nurse Practitioner [facility] Care Management [phone]

## 2019-11-18 ENCOUNTER — Ambulatory Visit (HOSPITAL_COMMUNITY): Payer: Medicare Other

## 2019-11-19 DIAGNOSIS — G4733 Obstructive sleep apnea (adult) (pediatric): Secondary | ICD-10-CM | POA: Diagnosis not present

## 2019-11-20 ENCOUNTER — Other Ambulatory Visit (INDEPENDENT_AMBULATORY_CARE_PROVIDER_SITE_OTHER): Payer: Medicare Other

## 2019-11-20 DIAGNOSIS — R002 Palpitations: Secondary | ICD-10-CM

## 2019-11-24 ENCOUNTER — Other Ambulatory Visit: Payer: Self-pay

## 2019-11-24 ENCOUNTER — Ambulatory Visit (HOSPITAL_COMMUNITY)
Admission: RE | Admit: 2019-11-24 | Discharge: 2019-11-24 | Disposition: A | Discharge status: Home / Self Care | Payer: Medicare Other | Source: Ambulatory Visit | Attending: Internal Medicine | Admitting: Internal Medicine

## 2019-11-24 DIAGNOSIS — R1013 Epigastric pain: Secondary | ICD-10-CM | POA: Insufficient documentation

## 2019-11-24 DIAGNOSIS — R140 Abdominal distension (gaseous): Secondary | ICD-10-CM | POA: Insufficient documentation

## 2019-11-24 DIAGNOSIS — K449 Diaphragmatic hernia without obstruction or gangrene: Secondary | ICD-10-CM | POA: Diagnosis not present

## 2019-11-24 DIAGNOSIS — I700 Atherosclerosis of aorta: Secondary | ICD-10-CM | POA: Diagnosis not present

## 2019-11-24 DIAGNOSIS — R630 Anorexia: Secondary | ICD-10-CM | POA: Diagnosis present

## 2019-11-24 DIAGNOSIS — K5730 Diverticulosis of large intestine without perforation or abscess without bleeding: Secondary | ICD-10-CM | POA: Diagnosis not present

## 2019-11-24 MED ORDER — IOHEXOL 300 MG/ML  SOLN
100.0000 mL | Freq: Once | INTRAMUSCULAR | Status: AC | PRN
  Administered 2019-11-24: 100 mL via INTRAVENOUS

## 2019-11-29 ENCOUNTER — Other Ambulatory Visit: Payer: Self-pay | Admitting: *Deleted

## 2019-11-29 NOTE — Patient Outreach (Signed)
[place] [facility]) Care Management  11/29/2019  JAELYNE DEEG 1945/05/20 [id number]  Telephone outreach: Left message to return my call.  [name]. [name], MSN, [facility] Gerontological Nurse Practitioner [facility] Care Management [phone]  12/01/19 Mrs. Milstein called me back. She is now wearing the home cardiac monitor again. She does report episodes of palpitations and she has been pushing the alert button to ensure capture and she is recording what she is feeling, what she is doing.  She voices a lot of frustration with not feeling like she is getting enough information. She is frustrated with most of her diagnostic tests not showing any reason for her complaints. She acknowledges some of this may be due to anxiety as the longer the problems exist she becomes more anxiousl  Listened and allowed her to voice her feelings. Encouraged her to write down all her questions before visits and give this list to the nurse and advise that you need to know these answers before I leave today.  Will send Emmi [employer] of [employer].  We will talk again in one month.  [name]. [name], MSN, [facility] Gerontological Nurse Practitioner [facility] Care Management [phone]

## 2019-12-06 ENCOUNTER — Encounter (HOSPITAL_BASED_OUTPATIENT_CLINIC_OR_DEPARTMENT_OTHER): Payer: Self-pay | Admitting: Emergency Medicine

## 2019-12-06 ENCOUNTER — Other Ambulatory Visit: Payer: Self-pay

## 2019-12-06 ENCOUNTER — Emergency Department (HOSPITAL_BASED_OUTPATIENT_CLINIC_OR_DEPARTMENT_OTHER)
Admission: EM | Admit: 2019-12-06 | Discharge: 2019-12-06 | Disposition: A | Discharge status: Home / Self Care | Payer: Medicare Other | Attending: Emergency Medicine | Admitting: Emergency Medicine

## 2019-12-06 ENCOUNTER — Emergency Department (HOSPITAL_BASED_OUTPATIENT_CLINIC_OR_DEPARTMENT_OTHER): Payer: Medicare Other

## 2019-12-06 DIAGNOSIS — I2510 Atherosclerotic heart disease of native coronary artery without angina pectoris: Secondary | ICD-10-CM | POA: Diagnosis not present

## 2019-12-06 DIAGNOSIS — Z7982 Long term (current) use of aspirin: Secondary | ICD-10-CM | POA: Insufficient documentation

## 2019-12-06 DIAGNOSIS — R079 Chest pain, unspecified: Secondary | ICD-10-CM | POA: Diagnosis not present

## 2019-12-06 DIAGNOSIS — Z79899 Other long term (current) drug therapy: Secondary | ICD-10-CM | POA: Insufficient documentation

## 2019-12-06 DIAGNOSIS — R002 Palpitations: Secondary | ICD-10-CM | POA: Diagnosis not present

## 2019-12-06 DIAGNOSIS — E876 Hypokalemia: Secondary | ICD-10-CM | POA: Diagnosis not present

## 2019-12-06 DIAGNOSIS — I10 Essential (primary) hypertension: Secondary | ICD-10-CM | POA: Diagnosis not present

## 2019-12-06 DIAGNOSIS — R0602 Shortness of breath: Secondary | ICD-10-CM | POA: Diagnosis not present

## 2019-12-06 DIAGNOSIS — Z87891 Personal history of nicotine dependence: Secondary | ICD-10-CM | POA: Diagnosis not present

## 2019-12-06 DIAGNOSIS — R109 Unspecified abdominal pain: Secondary | ICD-10-CM | POA: Diagnosis not present

## 2019-12-06 DIAGNOSIS — R519 Headache, unspecified: Secondary | ICD-10-CM | POA: Insufficient documentation

## 2019-12-06 LAB — COMPREHENSIVE METABOLIC PANEL
ALT: 15 U/L (ref 0–44)
AST: 21 U/L (ref 15–41)
Albumin: 3.8 g/dL (ref 3.5–5.0)
Alkaline Phosphatase: 55 U/L (ref 38–126)
Anion gap: 10 (ref 5–15)
BUN: 11 mg/dL (ref 8–23)
CO2: 31 mmol/L (ref 22–32)
Calcium: 9.4 mg/dL (ref 8.9–10.3)
Chloride: 98 mmol/L (ref 98–111)
Creatinine, Ser: 0.85 mg/dL (ref 0.44–1.00)
GFR, Estimated: >60 mL/min (ref >60)
Glucose, Bld: 96 mg/dL (ref 70–99)
Potassium: 2.8 mmol/L — ABNORMAL LOW (ref 3.5–5.1)
Sodium: 139 mmol/L (ref 135–145)
Total Bilirubin: 0.5 mg/dL (ref 0.3–1.2)
Total Protein: 7.5 g/dL (ref 6.5–8.1)

## 2019-12-06 LAB — CBC WITH DIFFERENTIAL/PLATELET
Abs Immature Granulocytes: 0.01 10*3/uL (ref 0.00–0.07)
Basophils Absolute: 0 10*3/uL (ref 0.0–0.1)
Basophils Relative: 1 %
Eosinophils Absolute: 0.2 10*3/uL (ref 0.0–0.5)
Eosinophils Relative: 3 %
HCT: 36.6 % (ref 36.0–46.0)
Hemoglobin: 11.8 g/dL — ABNORMAL LOW (ref 12.0–15.0)
Immature Granulocytes: 0 %
Lymphocytes Relative: 31 %
Lymphs Abs: 2.3 10*3/uL (ref 0.7–4.0)
MCH: 28.8 pg (ref 26.0–34.0)
MCHC: 32.2 g/dL (ref 30.0–36.0)
MCV: 89.3 fL (ref 80.0–100.0)
Monocytes Absolute: 0.5 10*3/uL (ref 0.1–1.0)
Monocytes Relative: 6 %
Neutro Abs: 4.5 10*3/uL (ref 1.7–7.7)
Neutrophils Relative %: 59 %
Platelets: 327 10*3/uL (ref 150–400)
RBC: 4.1 MIL/uL (ref 3.87–5.11)
RDW: 14.6 % (ref 11.5–15.5)
WBC: 7.5 10*3/uL (ref 4.0–10.5)
nRBC: 0 % (ref 0.0–0.2)

## 2019-12-06 LAB — MAGNESIUM: Magnesium: 1.8 mg/dL (ref 1.7–2.4)

## 2019-12-06 LAB — TROPONIN I (HIGH SENSITIVITY)
Troponin I (High Sensitivity): 4 ng/L (ref <18)
Troponin I (High Sensitivity): 5 ng/L (ref <18)

## 2019-12-06 MED ORDER — POTASSIUM CHLORIDE CRYS ER 20 MEQ PO TBCR
40.0000 meq | EXTENDED_RELEASE_TABLET | Freq: Once | ORAL | Status: AC
  Administered 2019-12-06: 40 meq via ORAL
  Filled 2019-12-06: qty 2

## 2019-12-06 MED ORDER — POTASSIUM CHLORIDE CRYS ER 20 MEQ PO TBCR: 20.0000 meq | EXTENDED_RELEASE_TABLET | Freq: Every day | ORAL | 0 refills | Status: DC

## 2019-12-06 NOTE — ED Triage Notes (Addendum)
Palpitations every day. She was seen here last month and recently wore a monitor after she followup with cardiology. She has not received the results. Denies pain. States she started feeling bad 1 hour ago while running errands.

## 2019-12-06 NOTE — ED Provider Notes (Signed)
[facility] EMERGENCY DEPARTMENT Provider Note   CSN: [id number] Arrival date & time: 12/06/19  1238     History Chief Complaint  Patient presents with  . Palpitations    Stephanie Parks is a 74 y.o. female with a past medical history of Hypertension, aneurysm, chronic headaches, palpitations, hyperlipidemia, who presents today for evaluation of multiple complaints. She states that she has these episodes and she had one today.  She states that today, as during her other episode she developed pain in the left side of her head, and her entire left-sided body including her chest.  She felt short of breath when this happened.  She just finished a 14-day Holter monitor however results are not back yet as she sent the device back on Saturday.  She states that this episode feels the same as all of her others.  It lasted about an hour and a half.  When she arrived here she was still having the episode and initial EKG was obtained while she was feeling this way.  She reports she also had weakness on the entire left side of her body, primarily arms and legs along with weakness in her right arm.  This is again consistent with her prior encounters.  She has been seen approximately 4 times in the past 6 months for similar.  Patient states that she has had multiple rounds of tests done, she has been seen by cardiology neurology and her family doctor without clear cause for her symptoms found.  She additionally states that she knows she has an aneurysm, and does not want additional imaging done on her head today.  She states that they have done "all sorts of scans" and not found anything.   HPI     Past Medical History:  Diagnosis Date  . 3-vessel CAD 03/01/2015  . Achalasia 09/28/2012  . Allergic rhinitis 03/01/2015  . Anemia   . Aneurysm ([place]) 03/01/2015  . BP (high blood pressure) 03/01/2015  . Bronchitis 04/23/2018  . Cephalalgia 08/24/2012   Overview:  ICD-10 cut over    . Chest pain  04/04/2011  . CN (constipation) 09/28/2012  . Coughing 04/24/2017  . Decreased potassium in the blood 03/01/2015  . Diverticulosis   . Dizziness 03/01/2015  . GERD (gastroesophageal reflux disease)   . Hiatal hernia   . Hypercholesterolemia 03/01/2015  . Hyperlipidemia   . Hypertension   . Ingrown toenail 08/10/2017  . Inguinal hernia   . Migraine headache   . Onychomycosis 12/08/2017  . Paresthesia of arm 03/01/2015  . Schatzki's ring   . Tendonitis, Achilles, right 12/08/2017  . Unilateral primary osteoarthritis, left knee 10/07/2017    Patient Active Problem List   Diagnosis Date Noted  . Seasonal allergic conjunctivitis 10/04/2019  . Dysfunction of right eustachian tube 10/04/2019  . Pre-diabetes 06/02/2019  . Seasonal and perennial allergic rhinitis 12/22/2018  . Heart palpitations 12/22/2018  . History of chest pain 07/20/2018  . Brain aneurysm 07/20/2018  . Bronchitis 04/23/2018  . Tendonitis, Achilles, right 12/08/2017  . Onychomycosis 12/08/2017  . Unilateral primary osteoarthritis, left knee 10/07/2017  . Ingrown toenail 08/10/2017  . Coughing 04/24/2017  . CAD (coronary artery disease) 03/01/2015  . Aneurysm ([place]) 03/01/2015  . Dizziness 03/01/2015  . Paresthesia of arm 03/01/2015  . Allergic rhinitis 03/01/2015  . Achalasia 09/28/2012  . CN (constipation) 09/28/2012  . Cephalalgia 08/24/2012  . HTN (hypertension) 04/19/2011  . Hyperlipidemia 04/19/2011  . GERD (gastroesophageal reflux disease) 04/19/2011  . Chest  pain 04/04/2011    Past Surgical History:  Procedure Laterality Date  . ABDOMINAL HYSTERECTOMY    . BACK SURGERY     x 3  . CARPAL TUNNEL RELEASE     left wrist  . CERVICAL SPINE SURGERY    . HEMORRHOID SURGERY       OB History   No obstetric history on file.     Family History  Problem Relation Age of Onset  . Allergic rhinitis Sister   . Diabetes Sister   . Hypertension Sister   . Heart attack Father 25  . Heart disease Father   .  Stomach cancer Maternal Grandmother   . Heart disease Mother   . Colon cancer Neg Hx   . Angioedema Neg Hx   . Asthma Neg Hx   . Eczema Neg Hx   . Urticaria Neg Hx   . Immunodeficiency Neg Hx   . Breast cancer Neg Hx   . Migraines Neg Hx   . Headache Neg Hx     Social History   Tobacco Use  . Smoking status: Former Smoker    Packs/day: 0.50    Years: 20.00    Pack years: 10.00    Types: Cigarettes    Quit date: 02/19/1984    Years since quitting: 35.8  . Smokeless tobacco: Never Used  Vaping Use  . Vaping Use: Never used  Substance Use Topics  . Alcohol use: No  . Drug use: No    Home Medications Prior to Admission medications   Medication Sig Start Date End Date Taking? Authorizing Provider  aspirin 81 MG tablet Take 81 mg by mouth daily.   Yes [provider]  Azelastine HCl 0.15 % SOLN Place 1-2 sprays into both nostrils 2 (two) times daily. Patient taking differently: Place 1-2 sprays into both nostrils 2 (two) times daily as needed (allergies).  12/22/18  Yes Ambs, Kathrine Cords, FNP  Calcium Carbonate (CALCIUM 600 PO) Take 1 tablet by mouth daily.   Yes [provider]  EPINEPHrine (EPIPEN 2-PAK) 0.3 mg/0.3 mL IJ SOAJ injection Inject 0.3 mLs (0.3 mg total) into the muscle as needed for anaphylaxis. Per allergen immunotherapy protocol 10/04/19  Yes Ambs, Kathrine Cords, FNP  fluticasone (FLONASE) 50 MCG/ACT nasal spray USE 2 SPRAYS IN NOSTRIL(S) DAILY AS NEEDED.  In the right nostril point the applicator out toward your right ear.  In the left nostril point the applicator out toward her left ear Patient taking differently: Place 2 sprays into both nostrils daily as needed for allergies.  10/04/19  Yes Ambs, Kathrine Cords, FNP  KLOR-CON M20 20 MEQ tablet TAKE 1 TABLET BY MOUTH EVERY DAY Patient taking differently: Take 20 mEq by mouth daily.  10/11/19  Yes Copland, Gay Filler, MD  losartan-hydrochlorothiazide (HYZAAR) 100-25 MG tablet Take 1 tablet by mouth daily. 09/27/19  Yes  Copland, Gay Filler, MD  metoprolol succinate (TOPROL-XL) 25 MG 24 hr tablet Take 0.5 tablets (12.5 mg total) by mouth daily. 09/06/19  Yes Copland, Gay Filler, MD  Multiple Vitamins-Minerals (MULTIVITAL) tablet Take by mouth.   Yes [provider]  pantoprazole (PROTONIX) 40 MG tablet Take 1 tablet (40 mg total) by mouth 2 (two) times daily. 11/10/19  Yes Pyrtle, Lajuan Lines, MD  rosuvastatin (CRESTOR) 10 MG tablet Take 10 mg by mouth at bedtime. 12/03/19  Yes [provider]  Sodium Chloride-Sodium Bicarb (NETI POT SINUS [place]) 2300-700 MG KIT Place 1 each into the nose daily as needed (sinuses).  Yes [provider]  potassium chloride SA (KLOR-CON) 20 MEQ tablet Take 1 tablet (20 mEq total) by mouth daily. 12/06/19   Lorin Glass, PA-C    Allergies    Shellfish allergy, Ace inhibitors, Gabapentin, Pitavastatin, Topiramate, and Sulfa antibiotics  Review of Systems   Review of Systems  Constitutional: Negative for chills and fever.  HENT: Negative for congestion.   Eyes: Negative for visual disturbance.  Respiratory: Positive for chest tightness and shortness of breath.   Cardiovascular: Positive for chest pain.  Genitourinary: Negative for dysuria.  Musculoskeletal: Positive for back pain.  Skin: Negative for color change and rash.  Neurological: Positive for weakness and headaches. Negative for dizziness and speech difficulty.  Psychiatric/Behavioral: Negative for confusion.  All other systems reviewed and are negative.   Physical Exam Updated Vital Signs BP (!) 140/56 (BP Location: Right Arm)   Pulse (!) 56   Temp 99 F (37.2 C) (Oral)   Resp 16   Ht 5' 5"  (1.651 m)   Wt 93 kg   SpO2 97%   BMI 34.11 kg/m   Physical Exam Vitals and nursing note reviewed.  Constitutional:      General: She is not in acute distress.    Appearance: She is well-developed. She is not ill-appearing.  HENT:     Head: Normocephalic and atraumatic.     Nose: Nose  normal.     Mouth/Throat:     Mouth: Mucous membranes are moist.  Eyes:     Conjunctiva/sclera: Conjunctivae normal.  Cardiovascular:     Rate and Rhythm: Normal rate and regular rhythm.     Pulses: Normal pulses.     Heart sounds: Normal heart sounds. No murmur heard.      Comments: 2+ radial, PT pulses bilaterally. Pulmonary:     Effort: Pulmonary effort is normal. No respiratory distress.     Breath sounds: Normal breath sounds.  Abdominal:     General: There is no distension.     Palpations: Abdomen is soft.     Tenderness: There is no abdominal tenderness. There is no guarding.  Musculoskeletal:     Cervical back: Normal range of motion and neck supple. No rigidity.     Right lower leg: No edema.     Left lower leg: No edema.  Skin:    General: Skin is warm and dry.  Neurological:     General: No focal deficit present.     Mental Status: She is alert and oriented to person, place, and time.     Comments: Mental Status:  Alert, oriented, thought content appropriate, able to give a coherent history. Speech fluent without evidence of aphasia. Able to follow 2 step commands without difficulty.  Cranial Nerves:  II:  Peripheral visual fields grossly normal, pupils equal, round, reactive to light III,IV, VI: ptosis not present, extra-ocular motions intact bilaterally  V,VII: smile symmetric, facial light touch sensation equal VIII: hearing grossly normal to voice  X: uvula elevates symmetrically  XI: bilateral shoulder shrug symmetric and strong XII: midline tongue extension without fassiculations Motor:  Normal tone. 5/5 in upper and lower extremities bilaterally including strong and equal grip strength and dorsiflexion/plantar flexion CV: distal pulses palpable throughout    Psychiatric:        Mood and Affect: Mood normal.        Behavior: Behavior normal.     ED Results / Procedures / Treatments   Labs (all labs ordered are listed, but only abnormal results  are  displayed) Labs Reviewed  COMPREHENSIVE METABOLIC PANEL - Abnormal; Notable for the following components:      Result Value   Potassium 2.8 (*)    All other components within normal limits  CBC WITH DIFFERENTIAL/PLATELET - Abnormal; Notable for the following components:   Hemoglobin 11.8 (*)    All other components within normal limits  MAGNESIUM  TROPONIN I (HIGH SENSITIVITY)  TROPONIN I (HIGH SENSITIVITY)    EKG None  Radiology DG Chest 2 View  Result Date: 12/06/2019 CLINICAL DATA:  Chest pain. Additional history provided: Patient reports palpitations. EXAM: CHEST - 2 VIEW COMPARISON:  Prior chest radiographs 10/31/2019 and earlier. FINDINGS: Heart size at the upper limits of normal, unchanged. There is no appreciable airspace consolidation. No evidence of pleural effusion or pneumothorax. No acute bony abnormality identified. Partially visualized ACDF hardware within the lower cervical spine. IMPRESSION: No evidence of active cardiopulmonary disease. Electronically Signed   By: Kellie Simmering DO   On: 12/06/2019 14:27    Procedures Procedures (including critical care time)  Medications Ordered in ED Medications  potassium chloride SA (KLOR-CON) CR tablet 40 mEq (40 mEq Oral Given 12/06/19 1514)    ED Course  I have reviewed the triage vital signs and the nursing notes.  Pertinent labs & imaging results that were available during my care of the patient were reviewed by me and considered in my medical decision making (see chart for details).    MDM Rules/Calculators/A&P                          Patient is a 74 year old woman who presents today for evaluation of a episode when she had pain in the entire left side of her body including her head.  She felt like she was weak in the left side and in her arm however she did not have slurred speech and was able to ambulate without difficulties during this.    She has been seen by cardiology, neurology and primary care for these  episodes in the past without cause for symptoms found.  She recently turned in a 2-week Holter monitor however does not have results yet.  Patient states her episode today was consistent with all the 1 she has had in the past.  Patient has a known aneurysm, however refused head imaging today stating it has been checked multiple times before.  At the time of my evaluation she is neurovascularly intact without any obvious residual deficits and had reportedly been able to walk and speak during this episode.  Her request for no additional head imaging will be honored.  Labs are obtained and reviewed, CMP shows mild hypokalemia with a potassium of 2.8, CBC shows minimal anemia.  Magnesium is normal.  She is given p.o. potassium.  She reports compliance with her outpatient p.o. potassium and says that it has been low previously.  At that point her primary care doctor had her double up on her potassium for "a few weeks."  Plan to discharge patient with 1 week of additional potassium, she is instructed to double her potassium, as she had previously been tolerated well, and to schedule a follow-up appointment with her primary care doctor as she may need her baseline potassium dose increased.  Opponent x2 is not elevated.  EKG without clear ischemia.  She did not have any noted significant arrhythmias while in the emergency room on telemetry.  She is borderline bradycardic with heart rates in the  mid to high 50s however I doubt this is clinically significant.  Chest x-ray is without evidence of acute abnormalities.    Given that patient has had these episodes intermittently for many months now, and has been seen by cardiology and neurology I doubt a acute serious or life-threatening cause of her symptoms.  At the time of evaluation her reported symptoms have all fully resolved.  She does have a known aneurysm however refused head imaging.  Recommended close outpatient follow-up with her team including PCP and  specialist.  Return precautions were discussed with patient who states their understanding.  At the time of discharge patient denied any unaddressed complaints or concerns.  Patient is agreeable for discharge home.  Note: Portions of this report may have been transcribed using voice recognition software. Every effort was made to ensure accuracy; however, inadvertent computerized transcription errors may be present   Final Clinical Impression(s) / ED Diagnoses Final diagnoses:  Palpitations  Hypokalemia    Rx / DC Orders ED Discharge Orders         Ordered    potassium chloride SA (KLOR-CON) 20 MEQ tablet  Daily        12/06/19 1743           Ollen Gross 12/06/19 2339    Lucrezia Starch, MD 12/08/19 1622

## 2019-12-06 NOTE — Discharge Instructions (Signed)
Today your potassium was low.  I have given you a prescription for 7 pills of potassium.  As we discussed this is to keep you from running out early.  I want you taking 1 pill of the 20 mg of potassium twice a day for the next week to try and increase your overall potassium.  Your potassium was 2.8  Given that your potassium is frequently low please schedule a follow-up appointment with your primary care doctor as you may need dose adjustments.  If your pain worsens, changes or becomes concerning please seek additional medical care and evaluation.  Please schedule a follow-up appointment with your cardiologist, and primary care doctor.

## 2019-12-06 NOTE — ED Notes (Signed)
Pt went to restroom and then EKG will be done

## 2019-12-07 DIAGNOSIS — R002 Palpitations: Secondary | ICD-10-CM | POA: Diagnosis not present

## 2019-12-09 DIAGNOSIS — R0789 Other chest pain: Secondary | ICD-10-CM | POA: Diagnosis not present

## 2019-12-09 DIAGNOSIS — J3089 Other allergic rhinitis: Secondary | ICD-10-CM

## 2019-12-09 DIAGNOSIS — R079 Chest pain, unspecified: Secondary | ICD-10-CM | POA: Diagnosis not present

## 2019-12-09 DIAGNOSIS — R600 Localized edema: Secondary | ICD-10-CM | POA: Diagnosis not present

## 2019-12-09 DIAGNOSIS — K449 Diaphragmatic hernia without obstruction or gangrene: Secondary | ICD-10-CM | POA: Diagnosis not present

## 2019-12-09 DIAGNOSIS — R0602 Shortness of breath: Secondary | ICD-10-CM | POA: Diagnosis not present

## 2019-12-09 DIAGNOSIS — R9431 Abnormal electrocardiogram [ECG] [EKG]: Secondary | ICD-10-CM | POA: Diagnosis not present

## 2019-12-09 DIAGNOSIS — Z87891 Personal history of nicotine dependence: Secondary | ICD-10-CM | POA: Diagnosis not present

## 2019-12-09 NOTE — Progress Notes (Signed)
VIALS EXP 12-08-20 

## 2019-12-10 ENCOUNTER — Telehealth: Payer: Self-pay

## 2019-12-10 ENCOUNTER — Other Ambulatory Visit: Payer: Self-pay | Admitting: *Deleted

## 2019-12-10 NOTE — Telephone Encounter (Signed)
Jettie Booze, MD  Drue Novel I, RN CAn we get her in for visit with me or app?   JV       Previous Messages   ----- Message -----  From: [name], MD  To: Jettie Booze, MD   Hi Ryan saw Harlene today. She continues to be very bothered by palpitations/ heart racing. I increased her BB dose a bit and am checking her thyroid. Would your office be able to bring her in for follow-up soon? I do wonder how much anxiety is playing into her symptoms   Thank you!  [place]

## 2019-12-10 NOTE — Telephone Encounter (Signed)
Left message to return call to schedule an appointment with Dr. Lorelei Pont.

## 2019-12-10 NOTE — Patient Outreach (Signed)
[facility]) Care Management  12/10/2019  [name] 10-Sep-1945 [id number]   Incoming call from [name] on 12/09/19.  [name] reports she has been to the ED 2 times this week for her continued complaints of "palpitations" and chest pain which woke her up in the middle of the night. She was evaluated and it was determined she had no cardiac issue but her hiaial hernia was noted again.  Chart review reveals pt has had 7 ED visits and 1 admission for these complaints at [facility] (She has ALSO went to [facility] ED) She has had repeated cardiac and GI studies on each trip. The culprit for her complaints seems to be a hiatal hernia. She is on pantoprazole  40 mg bid.  We had a long discussion about these trips with same result and the need for her to be calm, deep breathe, take her medication, elevate HOB, avoid cramped upper body positions.  She complained of her heart rate being 88. She reports it being in the 60's. Educated that all of these are normal heart rates, normal is 60-100 and even into the 50s on the metoprolol like she is. She states she didn't realize 86 was a normal rate. NP suspects pt becomes aware of her increased heart rate and this is what she becomes alarmed about. Reassured her. She also said she saw on that the results of her home heart monitor test are in Epic but she hasn't heard anything from cardiology. The report was posted on 12/08/19 and she is not happy about this. Advised pt that results can be posted before MDs have an opportunity to call and give results. (The test showed no life threatening arrythmias,  rare PACs and PVCs but this did not correlate with pt reported sxs. NO AFIB).  PLAN: Take pantoprozole 40 mg daily, elevate HOB, small meals, try some Boost or Ensure, don't eat before bed, avoid cramped body positions. Make and appt with Dr. Edilia Bo to discuss anxiety.  Stephanie Parks. Stephanie Neither, MSN, [facility] Gerontological Nurse Practitioner [facility] Care  Management [phone]

## 2019-12-10 NOTE — Telephone Encounter (Signed)
Left message for patient to call back an schedule an appointment.   Can put in any hold spots on 10/25 or 10/27 with Dr. Irish Lack.

## 2019-12-10 NOTE — Telephone Encounter (Signed)
-----   Message from Deloria Lair, NP sent at 12/10/2019  8:04 AM EDT ----- Regarding: Needs assessment & meds for anxiety/depression. Lennan has been to the ED twice this week for palpitations and CP - again no cardiac problem but hiatal hernia. PLEASE SEE MY NOTE FROM TODAY IN Epic.  We have got to get a handle on this. I think the only way is to help her cope and calm down, reassure her as we have been doing. I believe she would benefit from an antianxiety medication (perhaps, lorazepam 0.5 mg bid on a daily schedule for awhile) I believe she is depressed too but I didn't do a PHQ9.  We discussed this and she is in agreement. Would you please make an appt with her?  Thank you, Dr. Edilia Bo!  Kayleen Memos

## 2019-12-13 ENCOUNTER — Other Ambulatory Visit: Payer: Self-pay

## 2019-12-13 ENCOUNTER — Ambulatory Visit: Payer: Medicare Other | Admitting: Interventional Cardiology

## 2019-12-13 ENCOUNTER — Encounter: Payer: Self-pay | Admitting: Interventional Cardiology

## 2019-12-13 VITALS — BP 148/74 | HR 68 | Ht 65.0 in | Wt 207.2 lb

## 2019-12-13 DIAGNOSIS — R002 Palpitations: Secondary | ICD-10-CM

## 2019-12-13 DIAGNOSIS — I2510 Atherosclerotic heart disease of native coronary artery without angina pectoris: Secondary | ICD-10-CM | POA: Diagnosis not present

## 2019-12-13 DIAGNOSIS — I10 Essential (primary) hypertension: Secondary | ICD-10-CM

## 2019-12-13 DIAGNOSIS — E785 Hyperlipidemia, unspecified: Secondary | ICD-10-CM | POA: Diagnosis not present

## 2019-12-13 NOTE — Patient Instructions (Signed)

## 2019-12-13 NOTE — Progress Notes (Signed)
Cardiology Office Note   Date:  12/13/2019   ID:  Stephanie Parks, Stephanie Parks [birth year], MRN [id number]  PCP:  [name], MD    No chief complaint on file.  Palpitations, chest pain  Wt Readings from Last 3 Encounters:  12/13/19 207 lb 3.2 oz (94 kg)  12/06/19 205 lb (93 kg)  11/10/19 206 lb (93.4 kg)       History of Present Illness: Stephanie Parks is a 74 y.o. female    has had HTN and hyperlipidemia. She has a h/o brain aneurysm. She had a cath many years ago and no PCI was needed.   She has had palpitations, monitor in 2018 showed:  Normal sinus rhythm with occasional PACs and PVCs.  Occasional sinus tachycardia noted.  Patient's symptoms of fluttering did not correspond to an arrhythmia.  SHe has had atypical chest discomfort in the past.  She declined stress test as symptoms were mild and quite atypical including improving with exercise and belching.  Lovastatin was switched for Crestor in 2020.   12/2018 echo essentially normal at [facility].  Cath in 2020 at [facility]: "[place] and coronary angiogram via R CFA (accessed R radial but severe  tortuosity of R SCA and unable to wire into Ao so transitioned to femoral)  for chest pain (suspect the positive troponin was lab error). LM engaged,  normal. LAD normal, LCx normal. RCA engaged, normal. Angiogram of  innominate performed, no complication from attempted wiring via radial,  severe tortuosity confirmed; " LVEDP 20 mm Hg.   11/2019 monitor showed: "Normal sinus rhythm with rare PACs and PVCs, none of which correlated to symptoms.  No pathologic arrhythmias.  No atrial fibrillation."  Past Medical History:  Diagnosis Date  . 3-vessel CAD 03/01/2015  . Achalasia 09/28/2012  . Allergic rhinitis 03/01/2015  . Anemia   . Aneurysm ([place]) 03/01/2015  . BP (high blood pressure) 03/01/2015  . Bronchitis 04/23/2018  . Cephalalgia 08/24/2012   Overview:  ICD-10 cut over    . Chest pain 04/04/2011    . CN (constipation) 09/28/2012  . Coughing 04/24/2017  . Decreased potassium in the blood 03/01/2015  . Diverticulosis   . Dizziness 03/01/2015  . GERD (gastroesophageal reflux disease)   . Hiatal hernia   . Hypercholesterolemia 03/01/2015  . Hyperlipidemia   . Hypertension   . Ingrown toenail 08/10/2017  . Inguinal hernia   . Migraine headache   . Onychomycosis 12/08/2017  . Paresthesia of arm 03/01/2015  . Schatzki's ring   . Tendonitis, Achilles, right 12/08/2017  . Unilateral primary osteoarthritis, left knee 10/07/2017    Past Surgical History:  Procedure Laterality Date  . ABDOMINAL HYSTERECTOMY    . BACK SURGERY     x 3  . CARPAL TUNNEL RELEASE     left wrist  . CERVICAL SPINE SURGERY    . HEMORRHOID SURGERY       Current Outpatient Medications  Medication Sig Dispense Refill  . aspirin 81 MG tablet Take 81 mg by mouth daily.    . Azelastine HCl 0.15 % SOLN Place 1-2 sprays into both nostrils 2 (two) times daily. 30 mL 5  . Calcium Carbonate (CALCIUM 600 PO) Take 1 tablet by mouth daily.    Marland Kitchen EPINEPHrine (EPIPEN 2-PAK) 0.3 mg/0.3 mL IJ SOAJ injection Inject 0.3 mLs (0.3 mg total) into the muscle as needed for anaphylaxis. Per allergen immunotherapy protocol 1 each 2  . fluticasone (FLONASE) 50 MCG/ACT nasal spray USE  2 SPRAYS IN NOSTRIL(S) DAILY AS NEEDED.  In the right nostril point the applicator out toward your right ear.  In the left nostril point the applicator out toward her left ear 16 g 9  . metoprolol succinate (TOPROL-XL) 25 MG 24 hr tablet Take 0.5 tablets (12.5 mg total) by mouth daily. 90 tablet 1  . Multiple Vitamins-Minerals (MULTIVITAL) tablet Take by mouth.    . pantoprazole (PROTONIX) 40 MG tablet Take 1 tablet (40 mg total) by mouth 2 (two) times daily. 180 tablet 1  . rosuvastatin (CRESTOR) 10 MG tablet Take 10 mg by mouth at bedtime.    . Sodium Chloride-Sodium Bicarb (NETI POT SINUS WASH) 2300-700 MG KIT Place 1 each into the nose daily as needed  (sinuses).     No current facility-administered medications for this visit.    Allergies:   Shellfish allergy, Ace inhibitors, Gabapentin, Pitavastatin, Topiramate, and Sulfa antibiotics    Social History:  The patient  reports that she quit smoking about 35 years ago. Her smoking use included cigarettes. She has a 10.00 pack-year smoking history. She has never used smokeless tobacco. She reports that she does not drink alcohol and does not use drugs.   Family History:  The patient's family history includes Allergic rhinitis in her sister; Diabetes in her sister; Heart attack (age of onset: 57) in her father; Heart disease in her father and mother; Hypertension in her sister; Stomach cancer in her maternal grandmother.    ROS:  Please see the history of present illness.   Otherwise, review of systems are positive for palpitations.   All other systems are reviewed and negative.    PHYSICAL EXAM: VS:  Ht 5' 5"  (1.651 m)   Wt 207 lb 3.2 oz (94 kg)   BMI 34.48 kg/m  , BMI Body mass index is 34.48 kg/m. GEN: Well nourished, well developed, in no acute distress  HEENT: normal  Neck: no JVD, carotid bruits, or masses Cardiac: RRR; no murmurs, rubs, or gallops,no edema  Respiratory:  clear to auscultation bilaterally, normal work of breathing GI: soft, nontender, nondistended, + BS MS: no deformity or atrophy  Skin: warm and dry, no rash Neuro:  Strength and sensation are intact Psych: euthymic mood, full affect   EKG:   The ekg ordered 10/21 demonstrates NSR, no ST changes   Recent Labs: 07/30/2019: NT-Pro BNP 99 10/31/2019: B Natriuretic Peptide 111.4 11/10/2019: TSH 2.64 12/06/2019: ALT 15; BUN 11; Creatinine, Ser 0.85; Hemoglobin 11.8; Magnesium 1.8; Platelets 327; Potassium 2.8; Sodium 139   Lipid Panel    Component Value Date/Time   CHOL 161 06/02/2019 1439   CHOL 176 07/28/2018 0756   TRIG 86.0 06/02/2019 1439   HDL 56.00 06/02/2019 1439   HDL 73 07/28/2018 0756    CHOLHDL 3 06/02/2019 1439   VLDL 17.2 06/02/2019 1439   LDLCALC 87 06/02/2019 1439   LDLCALC 93 07/28/2018 0756     Other studies Reviewed: Additional studies/ records that were reviewed today with results demonstrating: 12/2018 echo essentially normal at [place]:  1.   Persistent left arm:  Negative w/u for ischemia.  Normal left radial pulse and left brachial pulse.  2.   Palpitations: Negative monitor result.  No AFib. She decreased caffeine intake.   3.   Unable to have cath from right radial approach due to tortuosity. 4.   HTN: 138/52 at home this AM. Higher if she eats salty food.   Main stress  is working at [employer]. She is frustrated that everything has been ok on several ER trips.    Current medicines are reviewed at length with the patient today.  The patient concerns regarding her medicines were addressed.  The following changes have been made:  No change  Labs/ tests ordered today include: negative troponin at [facility] ER No orders of the defined types were placed in this encounter.   Recommend 150 minutes/week of aerobic exercise Low fat, low carb, high fiber diet recommended  Disposition:   FU in 1 year   Signed, Larae Grooms, MD  12/13/2019 2:43 PM    [facility] [address], [place], [place]  [address] Phone: [phone]; Fax: [phone]

## 2019-12-14 DIAGNOSIS — M5416 Radiculopathy, lumbar region: Secondary | ICD-10-CM | POA: Diagnosis not present

## 2019-12-20 DIAGNOSIS — G4733 Obstructive sleep apnea (adult) (pediatric): Secondary | ICD-10-CM | POA: Diagnosis not present

## 2019-12-21 NOTE — Progress Notes (Addendum)
[facility] [address], [place], [place] [address] [phone]  Date:  12/23/2019   Name:  Stephanie Parks   DOB:  [birth year]   MRN:  [id number]  PCP:  [name], MD    Chief Complaint: Hospitalization Follow-up (follow up lab work, potasium level-low in hospital) and Headache   History of Present Illness:  Stephanie Parks is a 74 y.o. very pleasant female patient who presents with the following:  Patient here today for hospital follow-up She was seen in the [facility] ER on October 21 with chest pain-she was evaluated and released to home  Seen by her cardiologist October 25: 1.   Persistent left arm:  Negative w/u for ischemia.  Normal left radial pulse and left brachial pulse.  2.   Palpitations: Negative monitor result.  No AFib. She decreased caffeine intake.   3.   Unable to have cath from right radial approach due to tortuosity. 4.   HTN: 138/52 at home this AM. Higher if she eats salty food.   Main stress is working at [employer]. She is frustrated that everything has been ok on several ER trips.  Cardiology requested 1 year follow-up  COVID-19 vaccine-dates recorded  Flu vaccine- declines today  Patient has a somewhat confusing history of intermittent chest pain, left arm pain, feeling lightheaded and having pains in her head She is getting an MRI of her cervical spine - this is being done next week per [facility].  Patient states this has been ordered by neurology, it appears she is actually seeing an otolaryngologist.  Per patient, they have done a couple of injections?  Botox for pain in her head  We had had issues with low potassium- her most recent K level on 10/25 was ok at 3.5-checked a few days earlier as well when it was much lower She is now taking 40 meq of potassium and eating a banana now for about 3 weeks  She notes that she feels "lightheaded in the am- that is my biggest concern  right now is this head."   She notes that she is losing weight, not feeling as hungry or eating as much which likely explains weight loss  Her weight a year ago was 198lbs-her weight actually appears relatively stable Wt Readings from Last 3 Encounters:  12/23/19 203 lb (92.1 kg)  12/13/19 207 lb 3.2 oz (94 kg)  12/06/19 205 lb (93 kg)    Patient Active Problem List   Diagnosis Date Noted  . Seasonal allergic conjunctivitis 10/04/2019  . Dysfunction of right eustachian tube 10/04/2019  . Pre-diabetes 06/02/2019  . Seasonal and perennial allergic rhinitis 12/22/2018  . Heart palpitations 12/22/2018  . History of chest pain 07/20/2018  . Brain aneurysm 07/20/2018  . Bronchitis 04/23/2018  . Tendonitis, Achilles, right 12/08/2017  . Onychomycosis 12/08/2017  . Unilateral primary osteoarthritis, left knee 10/07/2017  . Ingrown toenail 08/10/2017  . Coughing 04/24/2017  . CAD (coronary artery disease) 03/01/2015  . Aneurysm ([place]) 03/01/2015  . Dizziness 03/01/2015  . Paresthesia of arm 03/01/2015  . Allergic rhinitis 03/01/2015  . Achalasia 09/28/2012  . CN (constipation) 09/28/2012  . Cephalalgia 08/24/2012  . HTN (hypertension) 04/19/2011  . Hyperlipidemia 04/19/2011  . GERD (gastroesophageal reflux disease) 04/19/2011  . Chest pain 04/04/2011    Past Medical History:  Diagnosis Date  . 3-vessel CAD 03/01/2015  . Achalasia 09/28/2012  . Allergic rhinitis 03/01/2015  .  Anemia   . Aneurysm ([place]) 03/01/2015  . BP (high blood pressure) 03/01/2015  . Bronchitis 04/23/2018  . Cephalalgia 08/24/2012   Overview:  ICD-10 cut over    . Chest pain 04/04/2011  . CN (constipation) 09/28/2012  . Coughing 04/24/2017  . Decreased potassium in the blood 03/01/2015  . Diverticulosis   . Dizziness 03/01/2015  . GERD (gastroesophageal reflux disease)   . Hiatal hernia   . Hypercholesterolemia 03/01/2015  . Hyperlipidemia   . Hypertension   . Ingrown toenail 08/10/2017  . Inguinal hernia   .  Migraine headache   . Onychomycosis 12/08/2017  . Paresthesia of arm 03/01/2015  . Schatzki's ring   . Tendonitis, Achilles, right 12/08/2017  . Unilateral primary osteoarthritis, left knee 10/07/2017    Past Surgical History:  Procedure Laterality Date  . ABDOMINAL HYSTERECTOMY    . BACK SURGERY     x 3  . CARPAL TUNNEL RELEASE     left wrist  . CERVICAL SPINE SURGERY    . HEMORRHOID SURGERY      Social History   Tobacco Use  . Smoking status: Former Smoker    Packs/day: 0.50    Years: 20.00    Pack years: 10.00    Types: Cigarettes    Quit date: 02/19/1984    Years since quitting: 35.8  . Smokeless tobacco: Never Used  Vaping Use  . Vaping Use: Never used  Substance Use Topics  . Alcohol use: No  . Drug use: No    Family History  Problem Relation Age of Onset  . Allergic rhinitis Sister   . Diabetes Sister   . Hypertension Sister   . Heart attack Father 2  . Heart disease Father   . Stomach cancer Maternal Grandmother   . Heart disease Mother   . Colon cancer Neg Hx   . Angioedema Neg Hx   . Asthma Neg Hx   . Eczema Neg Hx   . Urticaria Neg Hx   . Immunodeficiency Neg Hx   . Breast cancer Neg Hx   . Migraines Neg Hx   . Headache Neg Hx     Allergies  Allergen Reactions  . Shellfish Allergy Hives and Swelling  . Ace Inhibitors Other (See Comments)    Pt cannot recall her reaction but tolerates arb   . Gabapentin Other (See Comments)    headaches  . Pitavastatin   . Topiramate     Heart race,   . Sulfa Antibiotics Other (See Comments) and Rash    Fine bumps    Medication list has been reviewed and updated.  Current Outpatient Medications on File Prior to Visit  Medication Sig Dispense Refill  . aspirin 81 MG tablet Take 81 mg by mouth daily.    . Azelastine HCl 0.15 % SOLN Place 1-2 sprays into both nostrils 2 (two) times daily. 30 mL 5  . Calcium Carbonate (CALCIUM 600 PO) Take 1 tablet by mouth daily.    Marland Kitchen EPINEPHrine (EPIPEN 2-PAK) 0.3  mg/0.3 mL IJ SOAJ injection Inject 0.3 mLs (0.3 mg total) into the muscle as needed for anaphylaxis. Per allergen immunotherapy protocol 1 each 2  . fluticasone (FLONASE) 50 MCG/ACT nasal spray USE 2 SPRAYS IN NOSTRIL(S) DAILY AS NEEDED.  In the right nostril point the applicator out toward your right ear.  In the left nostril point the applicator out toward her left ear 16 g 9  . losartan-hydrochlorothiazide (HYZAAR) 50-12.5 MG tablet Take 1 tablet by mouth daily.    Marland Kitchen  metoprolol succinate (TOPROL-XL) 25 MG 24 hr tablet Take 0.5 tablets (12.5 mg total) by mouth daily. 90 tablet 1  . Multiple Vitamins-Minerals (MULTIVITAL) tablet Take by mouth.    . pantoprazole (PROTONIX) 40 MG tablet Take 1 tablet (40 mg total) by mouth 2 (two) times daily. 180 tablet 1  . potassium chloride SA (KLOR-CON) 20 MEQ tablet Take 20 mEq by mouth 2 (two) times daily.    . rosuvastatin (CRESTOR) 10 MG tablet Take 10 mg by mouth at bedtime.    . Sodium Chloride-Sodium Bicarb (NETI POT SINUS WASH) 2300-700 MG KIT Place 1 each into the nose daily as needed (sinuses).     No current facility-administered medications on file prior to visit.    Review of Systems:  As per HPI- otherwise negative.   Physical Examination: Vitals:   12/23/19 1003  BP: 126/68  Pulse: 65  Resp: 18  SpO2: 98%   Vitals:   12/23/19 1003  Weight: 203 lb (92.1 kg)  Height: 5' 5"  (1.651 m)   Body mass index is 33.78 kg/m. Ideal Body Weight: Weight in (lb) to have BMI = 25: 149.9  GEN: no acute distress.  Overweight, looks well  HEENT: Atraumatic, Normocephalic.  Ears and Nose: No external deformity. CV: RRR, No M/G/R. No JVD. No thrill. No extra heart sounds. PULM: CTA B, no wheezes, crackles, rhonchi. No retractions. No resp. distress. No accessory muscle use. ABD: S, NT, ND, +BS. No rebound. No HSM. EXTR: No c/c/e PSYCH: Normally interactive. Conversant. Normal bilateral upper extremity strength   Assessment and  Plan: Hypokalemia - Plan: Potassium, CANCELED: Potassium  Anxiety - Plan: Ambulatory referral to Psychology  Hospital discharge follow-up  Patient today for a follow-up visit She has been seen in the ER several times recently due to chest pain, evaluation was negative.  She is also seeing outpatient cardiology She has been concerned about low potassium-suspect this is due to HCTZ use  Consider stopping hctz as her K is running low.  Will check level today.  We may be able to stop both HCTZ and also her potassium to eliminate this problem  Referral to counseling for anxiety which likely is playing a significant role Will plan further follow- up pending labs.  This visit occurred during the SARS-CoV-2 public health emergency.  Safety protocols were in place, including screening questions prior to the visit, additional usage of staff PPE, and extensive cleaning of exam room while observing appropriate contact time as indicated for disinfecting solutions.    Signed Lamar Blinks, MD  Received her K level 11/5- will need to call pt  Called pt 11/6-  No answer, LMOM. Will send a mychart with details   Results for orders placed or performed in visit on 12/23/19  Potassium  Result Value Ref Range   Potassium 3.7 3.5 - 5.3 mmol/L

## 2019-12-23 ENCOUNTER — Ambulatory Visit (INDEPENDENT_AMBULATORY_CARE_PROVIDER_SITE_OTHER): Payer: Medicare Other | Admitting: Family Medicine

## 2019-12-23 ENCOUNTER — Other Ambulatory Visit: Payer: Self-pay

## 2019-12-23 ENCOUNTER — Encounter: Payer: Self-pay | Admitting: Family Medicine

## 2019-12-23 VITALS — BP 126/68 | HR 65 | Resp 18 | Ht 65.0 in | Wt 203.0 lb

## 2019-12-23 DIAGNOSIS — F419 Anxiety disorder, unspecified: Secondary | ICD-10-CM | POA: Diagnosis not present

## 2019-12-23 DIAGNOSIS — E876 Hypokalemia: Secondary | ICD-10-CM

## 2019-12-23 DIAGNOSIS — Z09 Encounter for follow-up examination after completed treatment for conditions other than malignant neoplasm: Secondary | ICD-10-CM

## 2019-12-23 NOTE — Patient Instructions (Addendum)
Good to see you again today- please stop by the lab and get your potassium checked  I think the reason for your low potassium is your diuretic- HCTZ.  I wonder if we can stop this medication?  Let me see how your potassium looks today  We will set you up with counseling to discuss anxiety.    Take care, please see me in about 4 months

## 2019-12-24 LAB — POTASSIUM: Potassium: 3.7 mmol/L (ref 3.5–5.3)

## 2019-12-25 ENCOUNTER — Encounter: Payer: Self-pay | Admitting: Family Medicine

## 2019-12-25 MED ORDER — LOSARTAN POTASSIUM 50 MG PO TABS: 50.0000 mg | ORAL_TABLET | Freq: Every day | ORAL | 3 refills | Status: DC

## 2019-12-25 NOTE — Addendum Note (Signed)
Addended by: Lamar Blinks C on: 12/25/2019 04:17 PM   Modules accepted: Orders

## 2019-12-27 ENCOUNTER — Ambulatory Visit (INDEPENDENT_AMBULATORY_CARE_PROVIDER_SITE_OTHER): Payer: Medicare Other

## 2019-12-27 ENCOUNTER — Other Ambulatory Visit: Payer: Self-pay

## 2019-12-27 ENCOUNTER — Ambulatory Visit: Payer: Self-pay

## 2019-12-27 ENCOUNTER — Ambulatory Visit: Payer: Medicare Other

## 2019-12-27 DIAGNOSIS — J309 Allergic rhinitis, unspecified: Secondary | ICD-10-CM | POA: Diagnosis not present

## 2019-12-27 NOTE — Progress Notes (Signed)
Immunotherapy   Patient Details  Name: CLASSIE WENG MRN: [id number] Date of Birth: 07-27-1945  12/27/2019  [name] came to pick up her vial, waited 30 minutes in office without any reactions. Patient was notified this would be her last vial picking up if she's not able to find a facility that can administer her injection. She understood and said she would just come into the office here to receive her injections.   Following schedule: C  Frequency: Weekly then once at 0.52mL every 4 weeks.  Epi-Pen: Yes  Consent signed and patient instructions given.   [name] 12/27/2019, 1:56 PM

## 2019-12-28 DIAGNOSIS — M4726 Other spondylosis with radiculopathy, lumbar region: Secondary | ICD-10-CM | POA: Diagnosis not present

## 2019-12-29 ENCOUNTER — Other Ambulatory Visit: Payer: Self-pay | Admitting: Neurosurgery

## 2019-12-29 ENCOUNTER — Other Ambulatory Visit (HOSPITAL_COMMUNITY): Payer: Self-pay | Admitting: Neurosurgery

## 2019-12-29 DIAGNOSIS — M4726 Other spondylosis with radiculopathy, lumbar region: Secondary | ICD-10-CM

## 2019-12-30 DIAGNOSIS — R519 Headache, unspecified: Secondary | ICD-10-CM | POA: Diagnosis not present

## 2020-01-03 ENCOUNTER — Telehealth: Payer: Self-pay | Admitting: Internal Medicine

## 2020-01-03 NOTE — Telephone Encounter (Signed)
Patient called to advise that her body odor has came back

## 2020-01-03 NOTE — Telephone Encounter (Signed)
Pt states her "odor" is back. She states when she breathes out she smells this terrible odor coming from inside her body. States Dr. Norman Herrlich gave her some medication but it has not helped. Please advise.

## 2020-01-04 ENCOUNTER — Other Ambulatory Visit: Payer: Self-pay | Admitting: *Deleted

## 2020-01-04 DIAGNOSIS — R519 Headache, unspecified: Secondary | ICD-10-CM | POA: Insufficient documentation

## 2020-01-04 NOTE — Patient Outreach (Signed)
[facility]) Care Management  01/04/2020  Stephanie Parks Jul 02, 1945 [id number]  Telephone outreach for monthly assessment Unsuccessful, left message and requested a return call.  [name]. [name], MSN, [facility] Gerontological Nurse Practitioner [facility] Care Management [phone]

## 2020-01-05 ENCOUNTER — Ambulatory Visit: Payer: Medicare Other | Admitting: *Deleted

## 2020-01-06 ENCOUNTER — Other Ambulatory Visit: Payer: Self-pay | Admitting: Family Medicine

## 2020-01-06 DIAGNOSIS — I10 Essential (primary) hypertension: Secondary | ICD-10-CM

## 2020-01-07 NOTE — Telephone Encounter (Signed)
This is a challenging symptom that I do not have a clear explanation for She was treated with rifaximin earlier this year but per last office note did not seem to help much I am not certain where this odor is coming from but may relate to intestinal bacteria, not a true infection  Would continue her current PPI but add Align 1 capsule daily for 4 to 6 weeks to see if this helps with her "odor"

## 2020-01-07 NOTE — Telephone Encounter (Signed)
Pt aware.

## 2020-01-11 ENCOUNTER — Other Ambulatory Visit: Payer: Self-pay | Admitting: Family Medicine

## 2020-01-11 DIAGNOSIS — E785 Hyperlipidemia, unspecified: Secondary | ICD-10-CM

## 2020-01-12 ENCOUNTER — Encounter: Payer: Self-pay | Admitting: Family Medicine

## 2020-01-12 ENCOUNTER — Telehealth: Payer: Self-pay | Admitting: Family Medicine

## 2020-01-12 DIAGNOSIS — H0235 Blepharochalasis left lower eyelid: Secondary | ICD-10-CM | POA: Diagnosis not present

## 2020-01-12 DIAGNOSIS — H0232 Blepharochalasis right lower eyelid: Secondary | ICD-10-CM | POA: Diagnosis not present

## 2020-01-12 DIAGNOSIS — H11823 Conjunctivochalasis, bilateral: Secondary | ICD-10-CM | POA: Diagnosis not present

## 2020-01-12 NOTE — Telephone Encounter (Signed)
Patient states she is having some swelling on her ankles and would like to know if you could prescribe the water pills.  She states neurology put her on rizatriptan 10 mg for headaches.   Following are her blood pressure reading along with heart rate. She would like your opinion  11/08 129/59 61 11/08 pm 124/53 73 11/09 142/63 58 11/10 136/61 52 11/11 138/65 59 11/12 140/67 67 11/13/ 127/58 64 11/14 138/62 63 11/15/ 143/65 63 11/16 129/60 59 11/17 142/68 63 11/18 161/66 92 11/19 130/65 64 11/20 135/67 74 11/21 156/76 67 11/22 - - - - - - - 11/23 149/73 69 11/24 150/74 72

## 2020-01-13 MED ORDER — TRIAMTERENE-HCTZ 37.5-25 MG PO TABS: 0.5000 | ORAL_TABLET | Freq: Every day | ORAL | 3 refills | Status: DC

## 2020-01-13 NOTE — Telephone Encounter (Signed)
Connected with pt via mychart

## 2020-01-14 DIAGNOSIS — G4733 Obstructive sleep apnea (adult) (pediatric): Secondary | ICD-10-CM | POA: Diagnosis not present

## 2020-01-18 NOTE — Progress Notes (Addendum)
[place] [facility] [address] [place],  [address] [phone]  Date:  01/19/2020   Name:  Stephanie Parks   DOB:  1945/10/03   MRN:  [id number]  PCP:  [name], MD    Chief Complaint: Edema and Hypokalemia   History of Present Illness:  Stephanie Parks is a 74 y.o. very pleasant female patient who presents with the following:  Here today for a periodic follow-up visit  Last seen by myself on 11/4 She contacted me recently with concern of swelling- she wanted to go back on a diuretic.  I called in Maxzide for her as we have struggled with hypokalemia on hctz.  She would like to recheck her BP and K levels today She started on the maxzide about 5 days ago- she is taking a 1/2 tablet once a day She feels like her swelling is much better and we note that her BP is under control  Flu vaccine- pt declines today   Her neurologist is with ?[facility]  Pt notes that last night her BP was 136/66 and her pulse was 80-she was worried about this "rapid heart rate"  BP Readings from Last 3 Encounters:  01/19/20 126/60  12/23/19 126/68  12/13/19 (!) 148/74    Patient Active Problem List   Diagnosis Date Noted  . Seasonal allergic conjunctivitis 10/04/2019  . Dysfunction of right eustachian tube 10/04/2019  . Pre-diabetes 06/02/2019  . Seasonal and perennial allergic rhinitis 12/22/2018  . Heart palpitations 12/22/2018  . History of chest pain 07/20/2018  . Brain aneurysm 07/20/2018  . Bronchitis 04/23/2018  . Tendonitis, Achilles, right 12/08/2017  . Onychomycosis 12/08/2017  . Unilateral primary osteoarthritis, left knee 10/07/2017  . Ingrown toenail 08/10/2017  . Coughing 04/24/2017  . CAD (coronary artery disease) 03/01/2015  . Aneurysm ([place]) 03/01/2015  . Dizziness 03/01/2015  . Paresthesia of arm 03/01/2015  . Allergic rhinitis 03/01/2015  . Achalasia 09/28/2012  . CN (constipation) 09/28/2012  .  Cephalalgia 08/24/2012  . HTN (hypertension) 04/19/2011  . Hyperlipidemia 04/19/2011  . GERD (gastroesophageal reflux disease) 04/19/2011  . Chest pain 04/04/2011    Past Medical History:  Diagnosis Date  . 3-vessel CAD 03/01/2015  . Achalasia 09/28/2012  . Allergic rhinitis 03/01/2015  . Anemia   . Aneurysm ([place]) 03/01/2015  . BP (high blood pressure) 03/01/2015  . Bronchitis 04/23/2018  . Cephalalgia 08/24/2012   Overview:  ICD-10 cut over    . Chest pain 04/04/2011  . CN (constipation) 09/28/2012  . Coughing 04/24/2017  . Decreased potassium in the blood 03/01/2015  . Diverticulosis   . Dizziness 03/01/2015  . GERD (gastroesophageal reflux disease)   . Hiatal hernia   . Hypercholesterolemia 03/01/2015  . Hyperlipidemia   . Hypertension   . Ingrown toenail 08/10/2017  . Inguinal hernia   . Migraine headache   . Onychomycosis 12/08/2017  . Paresthesia of arm 03/01/2015  . Schatzki's ring   . Tendonitis, Achilles, right 12/08/2017  . Unilateral primary osteoarthritis, left knee 10/07/2017    Past Surgical History:  Procedure Laterality Date  . ABDOMINAL HYSTERECTOMY    . BACK SURGERY     x 3  . CARPAL TUNNEL RELEASE     left wrist  . CERVICAL SPINE SURGERY    . HEMORRHOID SURGERY      Social History   Tobacco Use  . Smoking status: Former Smoker    Packs/day: 0.50  Years: 20.00    Pack years: 10.00    Types: Cigarettes    Quit date: 02/19/1984    Years since quitting: 35.9  . Smokeless tobacco: Never Used  Vaping Use  . Vaping Use: Never used  Substance Use Topics  . Alcohol use: No  . Drug use: No    Family History  Problem Relation Age of Onset  . Allergic rhinitis Sister   . Diabetes Sister   . Hypertension Sister   . Heart attack Father 57  . Heart disease Father   . Stomach cancer Maternal Grandmother   . Heart disease Mother   . Colon cancer Neg Hx   . Angioedema Neg Hx   . Asthma Neg Hx   . Eczema Neg Hx   . Urticaria Neg Hx   . Immunodeficiency  Neg Hx   . Breast cancer Neg Hx   . Migraines Neg Hx   . Headache Neg Hx     Allergies  Allergen Reactions  . Shellfish Allergy Hives and Swelling  . Ace Inhibitors Other (See Comments)    Pt cannot recall her reaction but tolerates arb   . Gabapentin Other (See Comments)    headaches  . Pitavastatin     Unknown reaction   . Topiramate     Heart race,   . Sulfa Antibiotics Other (See Comments) and Rash    Fine bumps    Medication list has been reviewed and updated.  Current Outpatient Medications on File Prior to Visit  Medication Sig Dispense Refill  . aspirin 81 MG tablet Take 81 mg by mouth daily.    . Azelastine HCl 0.15 % SOLN Place 1-2 sprays into both nostrils 2 (two) times daily. (Patient taking differently: Place 1-2 sprays into both nostrils 2 (two) times daily as needed (allergies). ) 30 mL 5  . EPINEPHrine (EPIPEN 2-PAK) 0.3 mg/0.3 mL IJ SOAJ injection Inject 0.3 mLs (0.3 mg total) into the muscle as needed for anaphylaxis. Per allergen immunotherapy protocol 1 each 2  . fluticasone (FLONASE) 50 MCG/ACT nasal spray USE 2 SPRAYS IN NOSTRIL(S) DAILY AS NEEDED.  In the right nostril point the applicator out toward your right ear.  In the left nostril point the applicator out toward her left ear (Patient taking differently: Place 2 sprays into both nostrils daily as needed for allergies. ) 16 g 9  . losartan (COZAAR) 50 MG tablet Take 1 tablet (50 mg total) by mouth daily. 90 tablet 3  . metoprolol succinate (TOPROL-XL) 25 MG 24 hr tablet Take 0.5 tablets (12.5 mg total) by mouth daily. 90 tablet 1  . Multiple Vitamins-Minerals (MULTIVITAL) tablet Take 1 tablet by mouth daily.     . pantoprazole (PROTONIX) 40 MG tablet Take 1 tablet (40 mg total) by mouth 2 (two) times daily. 180 tablet 1  . rosuvastatin (CRESTOR) 10 MG tablet TAKE 1 TABLET BY MOUTH EVERY DAY (Patient taking differently: Take 10 mg by mouth daily. ) 90 tablet 3  . Sodium Chloride-Sodium Bicarb (NETI POT  SINUS WASH) 2300-700 MG KIT Place 1 each into the nose daily as needed (sinuses).    . triamterene-hydrochlorothiazide (MAXZIDE-25) 37.5-25 MG tablet Take 0.5-1 tablets by mouth daily. Use as needed for swelling (Patient taking differently: Take 0.5 tablets by mouth daily as needed (swelling). ) 30 tablet 3   No current facility-administered medications on file prior to visit.    Review of Systems:  As per HPI- otherwise negative.   Physical Examination: Vitals:  01/19/20 0901  BP: 126/60  Pulse: 78  Resp: 17  SpO2: 98%   Vitals:   01/19/20 0901  Weight: 204 lb (92.5 kg)  Height: _0  (1.651 m)   Body mass index is 33.95 kg/m. Ideal Body Weight: Weight in (lb) to have BMI = 25: 149.9  GEN: no acute distress.  Looks well, overweight  HEENT: Atraumatic, Normocephalic.  Ears and Nose: No external deformity. CV: RRR, No M/G/R. No JVD. No thrill. No extra heart sounds. PULM: CTA B, no wheezes, crackles, rhonchi. No retractions. No resp. distress. No accessory muscle use. EXTR: No c/c/e Minimal swelling of both legs is present today, patient notes improvement PSYCH: Normally interactive. Conversant.    Assessment and Plan: Hypokalemia - Plan: Basic metabolic panel  Pedal edema  Essential hypertension  Pedal edema and BP under better control with current dose of maxzide.  Reassured pt that a pulse of 80 is normal We will be in touch with her K- assuming this is ok we can continue her current regimen  This visit occurred during the SARS-CoV-2 public health emergency.  Safety protocols were in place, including screening questions prior to the visit, additional usage of staff PPE, and extensive cleaning of exam room while observing appropriate contact time as indicated for disinfecting solutions.    Signed Lamar Blinks, MD   Received her labs as below, message to patient  Results for orders placed or performed in visit on 02/19/70  Basic metabolic panel  Result  Value Ref Range   Sodium 142 135 - 145 mEq/L   Potassium 3.5 3.5 - 5.1 mEq/L   Chloride 99 96 - 112 mEq/L   CO2 37 (H) 19 - 32 mEq/L   Glucose, Bld 87 70 - 99 mg/dL   BUN 14 6 - 23 mg/dL   Creatinine, Ser 0.88 0.40 - 1.20 mg/dL   GFR 64.80 >60.00 mL/min   Calcium 9.6 8.4 - 10.5 mg/dL

## 2020-01-19 ENCOUNTER — Other Ambulatory Visit: Payer: Self-pay

## 2020-01-19 ENCOUNTER — Ambulatory Visit (INDEPENDENT_AMBULATORY_CARE_PROVIDER_SITE_OTHER): Payer: Medicare Other | Admitting: Family Medicine

## 2020-01-19 ENCOUNTER — Encounter: Payer: Self-pay | Admitting: Family Medicine

## 2020-01-19 VITALS — BP 126/60 | HR 78 | Resp 17 | Ht 65.0 in | Wt 204.0 lb

## 2020-01-19 DIAGNOSIS — E876 Hypokalemia: Secondary | ICD-10-CM | POA: Diagnosis not present

## 2020-01-19 DIAGNOSIS — R600 Localized edema: Secondary | ICD-10-CM | POA: Diagnosis not present

## 2020-01-19 DIAGNOSIS — I10 Essential (primary) hypertension: Secondary | ICD-10-CM | POA: Diagnosis not present

## 2020-01-19 DIAGNOSIS — G4733 Obstructive sleep apnea (adult) (pediatric): Secondary | ICD-10-CM | POA: Diagnosis not present

## 2020-01-19 LAB — BASIC METABOLIC PANEL
BUN: 14 mg/dL (ref 6–23)
CO2: 37 mEq/L — ABNORMAL HIGH (ref 19–32)
Calcium: 9.6 mg/dL (ref 8.4–10.5)
Chloride: 99 mEq/L (ref 96–112)
Creatinine, Ser: 0.88 mg/dL (ref 0.40–1.20)
GFR: 64.8 mL/min (ref >60.00)
Glucose, Bld: 87 mg/dL (ref 70–99)
Potassium: 3.5 mEq/L (ref 3.5–5.1)
Sodium: 142 mEq/L (ref 135–145)

## 2020-01-19 NOTE — Patient Instructions (Signed)
Good to see you again today- I will be in touch with your potassium asap Your BP looks good!  The triamterene/hctz can be used for both BP control and fluid.  Right now both look great- continue current dosage

## 2020-01-20 ENCOUNTER — Ambulatory Visit (HOSPITAL_COMMUNITY)
Admission: RE | Admit: 2020-01-20 | Discharge: 2020-01-20 | Disposition: A | Discharge status: Home / Self Care | Payer: Medicare Other | Source: Ambulatory Visit | Attending: Neurosurgery | Admitting: Neurosurgery

## 2020-01-20 DIAGNOSIS — M5416 Radiculopathy, lumbar region: Secondary | ICD-10-CM | POA: Diagnosis not present

## 2020-01-20 DIAGNOSIS — M79605 Pain in left leg: Secondary | ICD-10-CM | POA: Diagnosis not present

## 2020-01-20 DIAGNOSIS — M5136 Other intervertebral disc degeneration, lumbar region: Secondary | ICD-10-CM | POA: Diagnosis not present

## 2020-01-20 DIAGNOSIS — M5116 Intervertebral disc disorders with radiculopathy, lumbar region: Secondary | ICD-10-CM | POA: Diagnosis not present

## 2020-01-20 DIAGNOSIS — M4726 Other spondylosis with radiculopathy, lumbar region: Secondary | ICD-10-CM | POA: Insufficient documentation

## 2020-01-20 MED ORDER — IOHEXOL 180 MG/ML  SOLN
20.0000 mL | Freq: Once | INTRAMUSCULAR | Status: AC | PRN
  Administered 2020-01-20: 20 mL via INTRATHECAL

## 2020-01-20 MED ORDER — DIAZEPAM 5 MG PO TABS
ORAL_TABLET | ORAL | Status: AC
  Administered 2020-01-20: 10 mg via ORAL
  Filled 2020-01-20: qty 2

## 2020-01-20 MED ORDER — LIDOCAINE HCL (PF) 1 % IJ SOLN
5.0000 mL | Freq: Once | INTRAMUSCULAR | Status: AC
  Administered 2020-01-20: 5 mL via INTRADERMAL

## 2020-01-20 MED ORDER — DIAZEPAM 5 MG PO TABS: 10.0000 mg | ORAL_TABLET | Freq: Once | ORAL | Status: AC

## 2020-01-20 MED ORDER — ONDANSETRON HCL 4 MG/2ML IJ SOLN: 4.0000 mg | Freq: Four times a day (QID) | INTRAMUSCULAR | Status: DC | PRN

## 2020-01-20 MED ORDER — OXYCODONE HCL 5 MG PO TABS: 5.0000 mg | ORAL_TABLET | ORAL | Status: DC | PRN

## 2020-01-20 NOTE — Discharge Instructions (Signed)

## 2020-01-20 NOTE — Op Note (Signed)
01/20/2020 lumbar Myelogram  PATIENT:  Stephanie Parks is a 74 y.o. female  PRE-OPERATIVE DIAGNOSIS:  Left lower extremity pain  POST-OPERATIVE DIAGNOSIS:  same  PROCEDURE:  Lumbar Myelogram  SURGEON:  Caison Hearn  ANESTHESIA:   local LOCAL MEDICATIONS USED:  LIDOCAINE  Procedure Note: Stephanie Parks is a 74 y.o. female Was taken to the fluoroscopy suite and  positioned prone on the fluoroscopy table. her back was prepared and draped in a sterile manner. I infiltrated 10 cc into the lumbar region. I then introduced a spinal needle into the thecal sac at the L5/s1 interlaminar space. I infiltrated 20cc of Isovue 180 into the thecal sac. Fluoroscopy showed the needle and contrast in the thecal sac. [name] tolerated the procedure well. she Will be taken to CT for evaluation.     PATIENT DISPOSITION:  Short Stay

## 2020-01-21 ENCOUNTER — Other Ambulatory Visit: Payer: Self-pay | Admitting: *Deleted

## 2020-01-21 NOTE — Patient Outreach (Signed)
[facility]) Care Management  01/21/2020  DAVIONNE MASTRANGELO [birth year] [phone]  Telephone outreach to follow up on HTN  Mrs. Dildine was working but she states she is well and continuing her self management practices.  We agreed to talk again in March.  Goals      Patient Stated   .  Weight (lb) < 200 lb (90.7 kg) (pt-stated)      Diet and exercise.      Other   .  Learn More About My Health      Follow Up Date  04/18/20  - make a list of questions - ask questions - repeat what I heard to make sure I understand - speak up when I don't understand    Why is this important?   The best way to learn about your health and care is by talking to the doctor and nurse.  They will answer your questions and give you information in the way that you like best.    Notes: Ms. Calip always has questions. Have encouraged her to write her questions down when they occur to her and they she can present this list to her provider when she is in the office. Her MD also communicates by my chart.    .  Manage My Emotions      Follow Up Date 04/18/20  Why is this important?   When you are stressed, down or upset, your body reacts too.  For example, your blood pressure may get higher; you may have a headache or stomachache.  When your emotions get the best of you, your body's ability to fight off cold and flu gets weak.  These steps will help you manage your emotions.     Notes: This is an issue that Ms. Muzio struggles with. She is easily excited and anxious with any change from what she perceives is her "normal." Most incidents are not urgent and she needs counseling about for example BP and Pulse normals to be reassured. This will be an ongoing need for her to be reassured.    .  [place]ain My Quality of Life      Follow Up Date 04/19/19   Why is this important?   Having a long-term illness can be scary.  It can also be stressful for you and your caregiver.  These steps may help.      Notes: Ms. Courts has been able to stay out of the hospital now since Oct 21. She has historically had one ED visit a month over the last year. It seems as though she is getting more comfortable and absorbing the explanations and recommendations by her health care team. Will check back in March.      .  Track and Manage My Blood Pressure      Follow Up Date 04/18/20    Why is this important?   You won't feel high blood pressure, but it can still hurt your blood vessels.  High blood pressure can cause heart or kidney problems. It can also cause a stroke.  Making lifestyle changes like losing a little weight or eating less salt will help.  Checking your blood pressure at home and at different times of the day can help to control blood pressure.  If the doctor prescribes medicine remember to take it the way the doctor ordered.  Call the office if you cannot afford the medicine or if there are questions about it.     Notes: Mrs.  Creedon does monitor her BP, encouraged to continue this monitoring regimen. 01/20/21 Mrs. Keadle does monitor her pressure and pulse. She has been educated on what is high and normal ranges. She eats healthy, takes her medications and is active.      [name]. [name], MSN, [facility] Nurse Practitioner [facility] Care Management [phone]

## 2020-01-25 DIAGNOSIS — M4726 Other spondylosis with radiculopathy, lumbar region: Secondary | ICD-10-CM | POA: Diagnosis not present

## 2020-01-25 DIAGNOSIS — I10 Essential (primary) hypertension: Secondary | ICD-10-CM | POA: Diagnosis not present

## 2020-02-03 ENCOUNTER — Ambulatory Visit: Payer: Self-pay

## 2020-02-03 ENCOUNTER — Ambulatory Visit: Payer: Medicare Other | Admitting: Orthopaedic Surgery

## 2020-02-03 ENCOUNTER — Other Ambulatory Visit: Payer: Self-pay

## 2020-02-03 ENCOUNTER — Encounter: Payer: Self-pay | Admitting: Orthopaedic Surgery

## 2020-02-03 VITALS — Ht 65.0 in | Wt 210.0 lb

## 2020-02-03 DIAGNOSIS — M25562 Pain in left knee: Secondary | ICD-10-CM | POA: Diagnosis not present

## 2020-02-03 DIAGNOSIS — M1712 Unilateral primary osteoarthritis, left knee: Secondary | ICD-10-CM | POA: Diagnosis not present

## 2020-02-03 MED ORDER — LIDOCAINE HCL 1 % IJ SOLN
2.0000 mL | INTRAMUSCULAR | Status: AC | PRN
  Administered 2020-02-03: 16:00:00 2 mL

## 2020-02-03 MED ORDER — BUPIVACAINE HCL 0.5 % IJ SOLN
2.0000 mL | INTRAMUSCULAR | Status: AC | PRN
  Administered 2020-02-03: 16:00:00 2 mL via INTRA_ARTICULAR

## 2020-02-03 NOTE — Progress Notes (Signed)
Office Visit Note   Patient: Stephanie Parks           Date of Birth: Oct 27, 1945           MRN: [id number] Visit Date: 02/03/2020              Requested by: [name], MD [address] [place],  [place] [address] PCP: [name], MD   Assessment & Plan: Visit Diagnoses:  1. Chronic pain of left knee   2. Unilateral primary osteoarthritis, left knee     Plan: Advanced osteoarthritis left knee.  Long discussion regarding the diagnosis and treatment options.  She would like to have a cortisone injection.  This was performed without difficulty.  We have also discussed use of Voltaren gel, NSAIDs and exercises.  We will plan to see her back as needed.  She may be a candidate for viscosupplementation at some point in the future  Follow-Up Instructions: Return if symptoms worsen or fail to improve.   Orders:  Orders Placed This Encounter  Procedures  . Large Joint Inj: L knee  . XR KNEE 3 VIEW LEFT   No orders of the defined types were placed in this encounter.     Procedures: Large Joint Inj: L knee on 02/03/2020 4:29 PM Indications: pain and diagnostic evaluation Details: 25 G 1.5 in needle, anteromedial approach  Arthrogram: No  Medications: 2 mL lidocaine 1 %; 2 mL bupivacaine 0.5 %  2 mL of betamethasone injected left knee with the Xylocaine and Marcaine Procedure, treatment alternatives, risks and benefits explained, specific risks discussed. Consent was given by the patient. Patient was prepped and draped in the usual sterile fashion.       Clinical Data: No additional findings.   Subjective: Chief Complaint  Patient presents with  . Left Knee - Pain  Patient presents today for left knee pain. She said that her knee has been hurtinf for one month on and off. No known injury. Her pain is worsening. Her pain is located anteriorly. She states that it gives way. No grinding or swelling. She has tried Tylenol and heat, but states  that it does not help.   HPI  Review of Systems   Objective: Vital Signs: Ht 5\' 5"  (1.651 m)   Wt 210 lb (95.3 kg)   BMI 34.95 kg/m   Physical Exam Constitutional:      Appearance: She is well-developed and well-nourished.  HENT:     Mouth/Throat:     Mouth: Oropharynx is clear and moist.  Eyes:     Extraocular Movements: EOM normal.     Pupils: Pupils are equal, round, and reactive to light.  Pulmonary:     Effort: Pulmonary effort is normal.  Skin:    General: Skin is warm and dry.  Neurological:     Mental Status: She is alert and oriented to person, place, and time.  Psychiatric:        Mood and Affect: Mood and affect normal.        Behavior: Behavior normal.     Ortho Exam awake alert and oriented x3.  Comfortable sitting.  Examination left knee revealed no effusion.  Large knees.  Has patella crepitation and minimal discomfort with compression.  Also has diffuse medial joint tenderness that is relatively mild.  Slight increased varus with weightbearing.  Full extension about 100 degrees of flexion without instability.  No popliteal pain or mass.  Straight leg raise negative.  Painless range of motion both hips  Specialty Comments:  No specialty comments available.  Imaging: XR KNEE 3 VIEW LEFT  Result Date: 02/03/2020 Knee obtained in several projections standing.  There is advanced tricompartment arthritis.  In the medial compartment there is near bone-on-bone with some areas of ectopic calcification within the menisci consistent with CPPD.  There are peripheral osteophytes and subchondral sclerosis as well.  In the lateral compartment there are osteophytes and some irregularity of the joint surface.  The patellofemoral joint is irregular with osteophyte formation.  No acute changes.  Films are consistent with advanced osteoarthritis    PMFS History: Patient Active Problem List   Diagnosis Date Noted  . Seasonal allergic conjunctivitis 10/04/2019  .  Dysfunction of right eustachian tube 10/04/2019  . Pre-diabetes 06/02/2019  . Seasonal and perennial allergic rhinitis 12/22/2018  . Heart palpitations 12/22/2018  . History of chest pain 07/20/2018  . Brain aneurysm 07/20/2018  . Bronchitis 04/23/2018  . Tendonitis, Achilles, right 12/08/2017  . Onychomycosis 12/08/2017  . Unilateral primary osteoarthritis, left knee 10/07/2017  . Ingrown toenail 08/10/2017  . Coughing 04/24/2017  . CAD (coronary artery disease) 03/01/2015  . Aneurysm ([place]) 03/01/2015  . Dizziness 03/01/2015  . Paresthesia of arm 03/01/2015  . Allergic rhinitis 03/01/2015  . Achalasia 09/28/2012  . CN (constipation) 09/28/2012  . Cephalalgia 08/24/2012  . HTN (hypertension) 04/19/2011  . Hyperlipidemia 04/19/2011  . GERD (gastroesophageal reflux disease) 04/19/2011  . Chest pain 04/04/2011   Past Medical History:  Diagnosis Date  . 3-vessel CAD 03/01/2015  . Achalasia 09/28/2012  . Allergic rhinitis 03/01/2015  . Anemia   . Aneurysm ([place]) 03/01/2015  . BP (high blood pressure) 03/01/2015  . Bronchitis 04/23/2018  . Cephalalgia 08/24/2012   Overview:  ICD-10 cut over    . Chest pain 04/04/2011  . CN (constipation) 09/28/2012  . Coughing 04/24/2017  . Decreased potassium in the blood 03/01/2015  . Diverticulosis   . Dizziness 03/01/2015  . GERD (gastroesophageal reflux disease)   . Hiatal hernia   . Hypercholesterolemia 03/01/2015  . Hyperlipidemia   . Hypertension   . Ingrown toenail 08/10/2017  . Inguinal hernia   . Migraine headache   . Onychomycosis 12/08/2017  . Paresthesia of arm 03/01/2015  . Schatzki's ring   . Tendonitis, Achilles, right 12/08/2017  . Unilateral primary osteoarthritis, left knee 10/07/2017    Family History  Problem Relation Age of Onset  . Allergic rhinitis Sister   . Diabetes Sister   . Hypertension Sister   . Heart attack Father 65  . Heart disease Father   . Stomach cancer Maternal Grandmother   . Heart disease Mother   .  Colon cancer Neg Hx   . Angioedema Neg Hx   . Asthma Neg Hx   . Eczema Neg Hx   . Urticaria Neg Hx   . Immunodeficiency Neg Hx   . Breast cancer Neg Hx   . Migraines Neg Hx   . Headache Neg Hx     Past Surgical History:  Procedure Laterality Date  . ABDOMINAL HYSTERECTOMY    . BACK SURGERY     x 3  . CARPAL TUNNEL RELEASE     left wrist  . CERVICAL SPINE SURGERY    . HEMORRHOID SURGERY     Social History   Occupational History  . Occupation: Surveyor, quantity: [id number]  Tobacco Use  . Smoking status: Former Smoker    Packs/day: 0.50    Years:  20.00    Pack years: 10.00    Types: Cigarettes    Quit date: 02/19/1984    Years since quitting: 35.9  . Smokeless tobacco: Never Used  Vaping Use  . Vaping Use: Never used  Substance and Sexual Activity  . Alcohol use: No  . Drug use: No  . Sexual activity: Yes    Partners: Male

## 2020-02-10 ENCOUNTER — Other Ambulatory Visit: Payer: Self-pay | Admitting: Family Medicine

## 2020-02-10 ENCOUNTER — Other Ambulatory Visit: Payer: Self-pay

## 2020-02-10 ENCOUNTER — Telehealth: Payer: Self-pay | Admitting: *Deleted

## 2020-02-10 MED ORDER — "TUBERCULIN SYRINGE 27G X 1/2"" 1 ML MISC": 10.0000 | Freq: Once | 0 refills | Status: DC | PRN

## 2020-02-10 NOTE — Telephone Encounter (Signed)
[facility] sent over request for syringes.  Did not see where we ever sent in for her.  Spoke with patient and it for her allergy injections.  Request faxed over to allergist office.

## 2020-02-19 DIAGNOSIS — G4733 Obstructive sleep apnea (adult) (pediatric): Secondary | ICD-10-CM | POA: Diagnosis not present

## 2020-03-02 ENCOUNTER — Telehealth: Payer: Self-pay | Admitting: Family Medicine

## 2020-03-02 NOTE — Telephone Encounter (Signed)
Caller Stephanie Parks  Call Back # [phone]  Patient is calling in reference to her heart rate, patient states her rate is 98. Blood pressure is 147/67, patient also states cough and fever of 99.1   Please advise

## 2020-03-02 NOTE — Telephone Encounter (Signed)
Pt. Called earlier today bc she couldn't get her allergy syringes from her pharmacy so she came by the office and we gave her some allergy syringes till she could get her's from her pharmacy. Pt. Stated our's looked different from what she was use to. I told her on the phone where she needed to draw her allergy serum up on the syringe. Pt. Verbalized understanding.

## 2020-03-03 NOTE — Telephone Encounter (Signed)
NV BP check or OV with you?

## 2020-03-03 NOTE — Telephone Encounter (Signed)
Patient states she does not have a cough or fever. She just need to be check for blood pressure. Stephanie Parks I schedule in office. Let me know.

## 2020-03-03 NOTE — Telephone Encounter (Signed)
Patient needs virtual visit. Working at [employer]. Mitzo, could you help me get her scheduled to see someone?

## 2020-03-05 ENCOUNTER — Encounter: Payer: Self-pay | Admitting: Family Medicine

## 2020-03-05 NOTE — Progress Notes (Addendum)
[name] [address] [place], [place] [address] [phone]  Date:  03/08/2020   Name:  Stephanie Parks   DOB:  11-10-1945   MRN:  [id number]  PCP:  [name], MD    Chief Complaint: Hypertension and Flashes/floaters (Seeing "spots in front of my eyes" started last week/)   History of Present Illness:  Stephanie Parks is a 75 y.o. very pleasant female patient who presents with the following:  Pt here today to discuss concern about BP - history of HTN and hypokalemia  Last seen by myself in early December  Pedal edema and BP under better control with current dose of maxzide.  Reassured pt that a pulse of 80 is normal We will be in touch with her K- assuming this is ok we can continue her current regimen   covid booster:  Done   About a week ago she started to notice floaters in her eyes  No flashing lights  No black curtain coming down The floaters are present in both eyes  Her vision seems to be about normal Her eye doctor is with [facility]-    She had noted her pulse up to 101 one day last week- she was not sure why it was so high at that time  Pulse Readings from Last 3 Encounters:  03/08/20 (!) 103  01/20/20 77  01/19/20 78   BP Readings from Last 3 Encounters:  03/08/20 124/60  01/20/20 (!) 152/83  01/19/20 126/60   She then also mentions chest pains- for the last week Last occurred yesterday while she was at rest This lasted 10 seconds  No CP now  She did see cardiology not long ago for chest pains, most recently in 10/21-she was seen by Dr. Irish Lack in October She has felt SOB as well for about one week   Patient Active Problem List   Diagnosis Date Noted  . Seasonal allergic conjunctivitis 10/04/2019  . Dysfunction of right eustachian tube 10/04/2019  . Pre-diabetes 06/02/2019  . Seasonal and perennial allergic rhinitis 12/22/2018  . Heart palpitations 12/22/2018   . History of chest pain 07/20/2018  . Brain aneurysm 07/20/2018  . Bronchitis 04/23/2018  . Tendonitis, Achilles, right 12/08/2017  . Onychomycosis 12/08/2017  . Unilateral primary osteoarthritis, left knee 10/07/2017  . Ingrown toenail 08/10/2017  . Coughing 04/24/2017  . CAD (coronary artery disease) 03/01/2015  . Aneurysm ([place]) 03/01/2015  . Dizziness 03/01/2015  . Paresthesia of arm 03/01/2015  . Allergic rhinitis 03/01/2015  . Achalasia 09/28/2012  . CN (constipation) 09/28/2012  . Cephalalgia 08/24/2012  . HTN (hypertension) 04/19/2011  . Hyperlipidemia 04/19/2011  . GERD (gastroesophageal reflux disease) 04/19/2011  . Chest pain 04/04/2011    Past Medical History:  Diagnosis Date  . 3-vessel CAD 03/01/2015  . Achalasia 09/28/2012  . Allergic rhinitis 03/01/2015  . Anemia   . Aneurysm ([place]) 03/01/2015  . BP (high blood pressure) 03/01/2015  . Bronchitis 04/23/2018  . Cephalalgia 08/24/2012   Overview:  ICD-10 cut over    . Chest pain 04/04/2011  . CN (constipation) 09/28/2012  . Coughing 04/24/2017  . Decreased potassium in the blood 03/01/2015  . Diverticulosis   . Dizziness 03/01/2015  . GERD (gastroesophageal reflux disease)   . Hiatal hernia   . Hypercholesterolemia 03/01/2015  . Hyperlipidemia   . Hypertension   . Ingrown toenail 08/10/2017  . Inguinal hernia   . Migraine  headache   . Onychomycosis 12/08/2017  . Paresthesia of arm 03/01/2015  . Schatzki's ring   . Tendonitis, Achilles, right 12/08/2017  . Unilateral primary osteoarthritis, left knee 10/07/2017    Past Surgical History:  Procedure Laterality Date  . ABDOMINAL HYSTERECTOMY    . BACK SURGERY     x 3  . CARPAL TUNNEL RELEASE     left wrist  . CERVICAL SPINE SURGERY    . HEMORRHOID SURGERY      Social History   Tobacco Use  . Smoking status: Former Smoker    Packs/day: 0.50    Years: 20.00    Pack years: 10.00    Types: Cigarettes    Quit date: 02/19/1984    Years since quitting: 36.0  .  Smokeless tobacco: Never Used  Vaping Use  . Vaping Use: Never used  Substance Use Topics  . Alcohol use: No  . Drug use: No    Family History  Problem Relation Age of Onset  . Allergic rhinitis Sister   . Diabetes Sister   . Hypertension Sister   . Heart attack Father 72  . Heart disease Father   . Stomach cancer Maternal Grandmother   . Heart disease Mother   . Colon cancer Neg Hx   . Angioedema Neg Hx   . Asthma Neg Hx   . Eczema Neg Hx   . Urticaria Neg Hx   . Immunodeficiency Neg Hx   . Breast cancer Neg Hx   . Migraines Neg Hx   . Headache Neg Hx     Allergies  Allergen Reactions  . Shellfish Allergy Hives and Swelling  . Ace Inhibitors Other (See Comments)    Pt cannot recall her reaction but tolerates arb   . Gabapentin Other (See Comments)    headaches  . Pitavastatin     Unknown reaction   . Topiramate     Heart race,   . Sulfa Antibiotics Other (See Comments) and Rash    Fine bumps    Medication list has been reviewed and updated.  Current Outpatient Medications on File Prior to Visit  Medication Sig Dispense Refill  . acetaminophen (TYLENOL) 650 MG CR tablet Take 1,300 mg by mouth every 8 (eight) hours as needed for pain.    Marland Kitchen ascorbic acid (VITAMIN C) 500 MG tablet Take 500 mg by mouth daily.    Marland Kitchen aspirin 81 MG tablet Take 81 mg by mouth daily.    . Azelastine HCl 0.15 % SOLN Place 1-2 sprays into both nostrils 2 (two) times daily. (Patient taking differently: Place 1-2 sprays into both nostrils 2 (two) times daily as needed (allergies).) 30 mL 5  . Calcium Carb-Cholecalciferol (CALCIUM 600 + D PO) Take 1 tablet by mouth daily.    Marland Kitchen ELDERBERRY PO Take 50 mg by mouth daily.    Marland Kitchen EPINEPHrine (EPIPEN 2-PAK) 0.3 mg/0.3 mL IJ SOAJ injection Inject 0.3 mLs (0.3 mg total) into the muscle as needed for anaphylaxis. Per allergen immunotherapy protocol 1 each 2  . fluticasone (FLONASE) 50 MCG/ACT nasal spray USE 2 SPRAYS IN NOSTRIL(S) DAILY AS NEEDED.  In the  right nostril point the applicator out toward your right ear.  In the left nostril point the applicator out toward her left ear (Patient taking differently: Place 2 sprays into both nostrils daily as needed for allergies.) 16 g 9  . losartan (COZAAR) 50 MG tablet Take 1 tablet (50 mg total) by mouth daily. 90 tablet 3  .  metoprolol succinate (TOPROL-XL) 25 MG 24 hr tablet Take 0.5 tablets (12.5 mg total) by mouth daily. 90 tablet 1  . Multiple Vitamins-Minerals (MULTIVITAL) tablet Take 1 tablet by mouth daily.     . nortriptyline (PAMELOR) 25 MG capsule Take 25 mg by mouth at bedtime.    . pantoprazole (PROTONIX) 40 MG tablet Take 1 tablet (40 mg total) by mouth 2 (two) times daily. 180 tablet 1  . Polyvinyl Alcohol-Povidone (REFRESH OP) Place 1 drop into both eyes daily as needed (dry eyes).    . Probiotic Product (PROBIOTIC PO) Take 1 capsule by mouth daily.    . rizatriptan (MAXALT-MLT) 10 MG disintegrating tablet Take 10 mg by mouth as needed for migraine. May repeat in 2 hours if needed    . rosuvastatin (CRESTOR) 10 MG tablet TAKE 1 TABLET BY MOUTH EVERY DAY (Patient taking differently: Take 10 mg by mouth daily.) 90 tablet 3  . Sodium Chloride-Sodium Bicarb (NETI POT SINUS WASH) 2300-700 MG KIT Place 1 each into the nose daily as needed (sinuses).    . triamterene-hydrochlorothiazide (MAXZIDE-25) 37.5-25 MG tablet Take 0.5-1 tablets by mouth daily. Use as needed for swelling (Patient taking differently: Take 0.5 tablets by mouth daily as needed (swelling).) 30 tablet 3  . TUBERCULIN SYR 1CC/27GX1/2" (MONOJECT SYRINGE 1ML 27GX1/2") 27G X 1/2" 1 ML MISC 10 each by Does not apply route once as needed for up to 1 dose. 10 each 0  . zinc gluconate 50 MG tablet Take 50 mg by mouth daily.     No current facility-administered medications on file prior to visit.    Review of Systems:  As per HPI- otherwise negative.   Physical Examination: Vitals:   03/08/20 1523  BP: 124/60  Pulse: (!) 103   Resp: 17  SpO2: 92%   Vitals:   03/08/20 1523  Weight: 202 lb (91.6 kg)  Height: _0  (1.651 m)   Body mass index is 33.61 kg/m. Ideal Body Weight: Weight in (lb) to have BMI = 25: 149.9  GEN: no acute distress.  Obese, otherwise looks well HEENT: Atraumatic, Normocephalic.  Ears and Nose: No external deformity. CV: RRR, No M/G/R. No JVD. No thrill. No extra heart sounds. PULM: CTA B, no wheezes, crackles, rhonchi. No retractions. No resp. distress. No accessory muscle use. ABD: S, NT, ND, +BS. No rebound. No HSM. EXTR: No c/c/e PSYCH: Normally interactive. Conversant.   EKG: NSR Compared with tracing from 10/21 no significant change noted  Assessment and Plan: Primary hypertension - Plan: EKG 12-Lead, CBC, Basic metabolic panel, TSH, Troponin I, CANCELED: Troponin I  SOB (shortness of breath) - Plan: D-Dimer, Quantitative, CANCELED: D-Dimer, Quantitative  Chest pain, unspecified type - Plan: D-Dimer, Quantitative, CANCELED: Troponin I (High Sensitivity), CANCELED: D-Dimer, Quantitative  Vitreous floaters of both eyes  Patient here today for a few concerns Were able to obtain an appointment with her eye doctor tomorrow at 2:30 PM.  I explained to her that typically if she has an acute eye issue it would be best to contact her eye doctor immediately, certainly I am glad to offer advice but I am not able to perform a full ophthalmologic exam in my office  She also notes shortness of breath and chest pain, these have been present off and on for a few months.  We will obtain a troponin today, also D-dimer.  Explained that if either of these tests are positive further steps will be needed  Blood pressure looks okay today, advised patient  that a pulse rate of up to 100 is actually normal This visit occurred during the SARS-CoV-2 public health emergency.  Safety protocols were in place, including screening questions prior to the visit, additional usage of staff PPE, and extensive  cleaning of exam room while observing appropriate contact time as indicated for disinfecting solutions.    Signed Lamar Blinks, MD   addnd 1/20- received her labs and called her LMOM at home and cell, will send mychart Need to get CT angiogram   Results for orders placed or performed in visit on 03/08/20  D-Dimer, Quantitative  Result Value Ref Range   D-Dimer, Quant 1.10 (H) <0.50 mcg/mL FEU  Troponin I  Result Value Ref Range   Troponin I 7 < OR = 47 ng/L   1/20 Received critical potassium- was ok in November and December   Called pt  She is using maxzide about twice a week She is not taking any potassium right now  She has klor -con on hand but states that she cannot read the label to determine the strength  Advised her that we will start on oral potassium now.  Called in prescription for 20 mEq, take two today and then one daily and will recheck potassium next week We will try to get CT angiogram done today She also requested medication for cough, called in Tessalon Perles  We ended up getting CT ordered for tomorrow, she will recheck her potassium level on Monday  Results for orders placed or performed in visit on 03/08/20  CBC  Result Value Ref Range   WBC 3.6 (L) 4.0 - 10.5 K/uL   RBC 4.16 3.87 - 5.11 Mil/uL   Platelets 236.0 150.0 - 400.0 K/uL   Hemoglobin 11.7 (L) 12.0 - 15.0 g/dL   HCT 35.3 (L) 36.0 - 46.0 %   MCV 84.9 78.0 - 100.0 fl   MCHC 33.2 30.0 - 36.0 g/dL   RDW 14.6 11.5 - 19.5 %  Basic metabolic panel  Result Value Ref Range   Sodium 137 135 - 145 mEq/L   Potassium 2.7 (LL) 3.5 - 5.1 mEq/L   Chloride 95 (L) 96 - 112 mEq/L   CO2 34 (H) 19 - 32 mEq/L   Glucose, Bld 99 70 - 99 mg/dL   BUN 12 6 - 23 mg/dL   Creatinine, Ser 1.10 0.40 - 1.20 mg/dL   GFR 49.53 (L) >60.00 mL/min   Calcium 8.5 8.4 - 10.5 mg/dL  TSH  Result Value Ref Range   TSH 3.80 0.35 - 4.50 uIU/mL  D-Dimer, Quantitative  Result Value Ref Range   D-Dimer, Quant 1.10 (H) <0.50  mcg/mL FEU  Troponin I  Result Value Ref Range   Troponin I 7 < OR = 47 ng/L

## 2020-03-05 NOTE — Patient Instructions (Incomplete)
Good to see you again today!  For your wrist pain please try diclofenac gel topically.  If not helpful please see your orthopedist Please see your eye doctor asap for your floaters.  In the future if you have an acute eye complaint it would be a good idea to contact your eye doctor  We will get labs today - will check a D dimer to risk stratify for a blood clot in your lungs We will also check a troponin to make sure no sign of cardiac distress

## 2020-03-07 NOTE — Telephone Encounter (Signed)
Patient has appt tomorrow with Dr. Lorelei Pont.

## 2020-03-08 ENCOUNTER — Encounter: Payer: Self-pay | Admitting: Family Medicine

## 2020-03-08 ENCOUNTER — Ambulatory Visit (INDEPENDENT_AMBULATORY_CARE_PROVIDER_SITE_OTHER): Payer: Medicare Other | Admitting: Family Medicine

## 2020-03-08 ENCOUNTER — Other Ambulatory Visit: Payer: Self-pay

## 2020-03-08 VITALS — BP 124/60 | HR 90 | Resp 17 | Ht 65.0 in | Wt 202.0 lb

## 2020-03-08 DIAGNOSIS — I10 Essential (primary) hypertension: Secondary | ICD-10-CM

## 2020-03-08 DIAGNOSIS — R079 Chest pain, unspecified: Secondary | ICD-10-CM | POA: Diagnosis not present

## 2020-03-08 DIAGNOSIS — R7989 Other specified abnormal findings of blood chemistry: Secondary | ICD-10-CM | POA: Diagnosis not present

## 2020-03-08 DIAGNOSIS — E876 Hypokalemia: Secondary | ICD-10-CM | POA: Diagnosis not present

## 2020-03-08 DIAGNOSIS — H43393 Other vitreous opacities, bilateral: Secondary | ICD-10-CM

## 2020-03-08 DIAGNOSIS — R0602 Shortness of breath: Secondary | ICD-10-CM

## 2020-03-08 LAB — D-DIMER, QUANTITATIVE: D-Dimer, Quant: 1.1 mcg/mL FEU — ABNORMAL HIGH (ref <0.50)

## 2020-03-08 LAB — TROPONIN I: Troponin I: 7 ng/L (ref <=47)

## 2020-03-09 ENCOUNTER — Other Ambulatory Visit: Payer: Self-pay | Admitting: Family Medicine

## 2020-03-09 ENCOUNTER — Encounter: Payer: Self-pay | Admitting: Family Medicine

## 2020-03-09 ENCOUNTER — Telehealth: Payer: Self-pay

## 2020-03-09 DIAGNOSIS — I10 Essential (primary) hypertension: Secondary | ICD-10-CM

## 2020-03-09 LAB — CBC
HCT: 35.3 % — ABNORMAL LOW (ref 36.0–46.0)
Hemoglobin: 11.7 g/dL — ABNORMAL LOW (ref 12.0–15.0)
MCHC: 33.2 g/dL (ref 30.0–36.0)
MCV: 84.9 fl (ref 78.0–100.0)
Platelets: 236 10*3/uL (ref 150.0–400.0)
RBC: 4.16 Mil/uL (ref 3.87–5.11)
RDW: 14.6 % (ref 11.5–15.5)
WBC: 3.6 10*3/uL — ABNORMAL LOW (ref 4.0–10.5)

## 2020-03-09 LAB — BASIC METABOLIC PANEL
BUN: 12 mg/dL (ref 6–23)
CO2: 34 mEq/L — ABNORMAL HIGH (ref 19–32)
Calcium: 8.5 mg/dL (ref 8.4–10.5)
Chloride: 95 mEq/L — ABNORMAL LOW (ref 96–112)
Creatinine, Ser: 1.1 mg/dL (ref 0.40–1.20)
GFR: 49.53 mL/min — ABNORMAL LOW (ref >60.00)
Glucose, Bld: 99 mg/dL (ref 70–99)
Potassium: 2.7 mEq/L — CL (ref 3.5–5.1)
Sodium: 137 mEq/L (ref 135–145)

## 2020-03-09 LAB — TSH: TSH: 3.8 u[IU]/mL (ref 0.35–4.50)

## 2020-03-09 MED ORDER — POTASSIUM CHLORIDE CRYS ER 20 MEQ PO TBCR: EXTENDED_RELEASE_TABLET | ORAL | 0 refills | Status: DC

## 2020-03-09 MED ORDER — BENZONATATE 100 MG PO CAPS: 100.0000 mg | ORAL_CAPSULE | Freq: Three times a day (TID) | ORAL | 0 refills | Status: DC | PRN

## 2020-03-09 NOTE — Addendum Note (Signed)
Addended by: [name] on: 03/09/2020 08:44 AM   Modules accepted: Orders

## 2020-03-09 NOTE — Addendum Note (Signed)
Addended by: Lamar Blinks C on: 03/09/2020 02:32 PM   Modules accepted: Orders

## 2020-03-09 NOTE — Telephone Encounter (Signed)
CRITICAL VALUE STICKER  CRITICAL VALUE: Potassium 2.7  RECEIVER (on-site recipient of call):Stephanie Parks  DATE & TIME NOTIFIED: 03/09/20 @11 :23  MESSENGER (representative from lab):Hinda Lenis  MD NOTIFIED: [place]  TIME OF NOTIFICATION:11:24 am  RESPONSE:

## 2020-03-10 ENCOUNTER — Telehealth: Payer: Self-pay | Admitting: Family Medicine

## 2020-03-10 ENCOUNTER — Other Ambulatory Visit: Payer: Self-pay

## 2020-03-10 ENCOUNTER — Ambulatory Visit (HOSPITAL_BASED_OUTPATIENT_CLINIC_OR_DEPARTMENT_OTHER)
Admission: RE | Admit: 2020-03-10 | Discharge: 2020-03-10 | Disposition: A | Discharge status: Home / Self Care | Payer: Medicare Other | Source: Ambulatory Visit | Attending: Family Medicine | Admitting: Family Medicine

## 2020-03-10 ENCOUNTER — Encounter: Payer: Self-pay | Admitting: Family Medicine

## 2020-03-10 DIAGNOSIS — E669 Obesity, unspecified: Secondary | ICD-10-CM | POA: Diagnosis present

## 2020-03-10 DIAGNOSIS — Z7982 Long term (current) use of aspirin: Secondary | ICD-10-CM | POA: Diagnosis not present

## 2020-03-10 DIAGNOSIS — R7989 Other specified abnormal findings of blood chemistry: Secondary | ICD-10-CM | POA: Insufficient documentation

## 2020-03-10 DIAGNOSIS — I10 Essential (primary) hypertension: Secondary | ICD-10-CM | POA: Diagnosis not present

## 2020-03-10 DIAGNOSIS — G9341 Metabolic encephalopathy: Secondary | ICD-10-CM | POA: Diagnosis not present

## 2020-03-10 DIAGNOSIS — R079 Chest pain, unspecified: Secondary | ICD-10-CM

## 2020-03-10 DIAGNOSIS — E785 Hyperlipidemia, unspecified: Secondary | ICD-10-CM | POA: Diagnosis not present

## 2020-03-10 DIAGNOSIS — Z882 Allergy status to sulfonamides status: Secondary | ICD-10-CM | POA: Diagnosis not present

## 2020-03-10 DIAGNOSIS — I2602 Saddle embolus of pulmonary artery with acute cor pulmonale: Secondary | ICD-10-CM | POA: Diagnosis not present

## 2020-03-10 DIAGNOSIS — Z91013 Allergy to seafood: Secondary | ICD-10-CM | POA: Diagnosis not present

## 2020-03-10 DIAGNOSIS — Z6832 Body mass index (BMI) 32.0-32.9, adult: Secondary | ICD-10-CM | POA: Diagnosis not present

## 2020-03-10 DIAGNOSIS — K5790 Diverticulosis of intestine, part unspecified, without perforation or abscess without bleeding: Secondary | ICD-10-CM | POA: Diagnosis not present

## 2020-03-10 DIAGNOSIS — R918 Other nonspecific abnormal finding of lung field: Secondary | ICD-10-CM | POA: Diagnosis not present

## 2020-03-10 DIAGNOSIS — R0602 Shortness of breath: Secondary | ICD-10-CM

## 2020-03-10 DIAGNOSIS — J1282 Pneumonia due to coronavirus disease 2019: Secondary | ICD-10-CM | POA: Diagnosis not present

## 2020-03-10 DIAGNOSIS — Z79899 Other long term (current) drug therapy: Secondary | ICD-10-CM | POA: Diagnosis not present

## 2020-03-10 DIAGNOSIS — Z87891 Personal history of nicotine dependence: Secondary | ICD-10-CM | POA: Diagnosis not present

## 2020-03-10 DIAGNOSIS — E875 Hyperkalemia: Secondary | ICD-10-CM | POA: Diagnosis not present

## 2020-03-10 DIAGNOSIS — R7303 Prediabetes: Secondary | ICD-10-CM | POA: Diagnosis not present

## 2020-03-10 DIAGNOSIS — Z888 Allergy status to other drugs, medicaments and biological substances status: Secondary | ICD-10-CM | POA: Diagnosis not present

## 2020-03-10 DIAGNOSIS — Z8249 Family history of ischemic heart disease and other diseases of the circulatory system: Secondary | ICD-10-CM | POA: Diagnosis not present

## 2020-03-10 DIAGNOSIS — I2510 Atherosclerotic heart disease of native coronary artery without angina pectoris: Secondary | ICD-10-CM | POA: Diagnosis not present

## 2020-03-10 DIAGNOSIS — I2699 Other pulmonary embolism without acute cor pulmonale: Secondary | ICD-10-CM | POA: Diagnosis not present

## 2020-03-10 DIAGNOSIS — R0902 Hypoxemia: Secondary | ICD-10-CM | POA: Diagnosis not present

## 2020-03-10 DIAGNOSIS — E876 Hypokalemia: Secondary | ICD-10-CM | POA: Diagnosis not present

## 2020-03-10 DIAGNOSIS — J9601 Acute respiratory failure with hypoxia: Secondary | ICD-10-CM | POA: Diagnosis not present

## 2020-03-10 DIAGNOSIS — K219 Gastro-esophageal reflux disease without esophagitis: Secondary | ICD-10-CM | POA: Diagnosis not present

## 2020-03-10 DIAGNOSIS — U071 COVID-19: Secondary | ICD-10-CM | POA: Diagnosis not present

## 2020-03-10 DIAGNOSIS — G4733 Obstructive sleep apnea (adult) (pediatric): Secondary | ICD-10-CM | POA: Diagnosis not present

## 2020-03-10 DIAGNOSIS — J309 Allergic rhinitis, unspecified: Secondary | ICD-10-CM | POA: Diagnosis not present

## 2020-03-10 MED ORDER — IOHEXOL 350 MG/ML SOLN
100.0000 mL | Freq: Once | INTRAVENOUS | Status: AC
  Administered 2020-03-10: 80 mL via INTRAVENOUS

## 2020-03-10 NOTE — Addendum Note (Signed)
Addended by: Kelle Darting A on: 03/10/2020 02:33 PM   Modules accepted: Orders

## 2020-03-10 NOTE — Telephone Encounter (Signed)
Received her CT report from radiology- concerning for covid 19 pneumonia.  I have called pt and LMOM on her home and cell phones. Per radiology, her pneumonia appears severe enough for possible hospital care.  She may want to proceed to the ER for possible admission esp if she is not feeling better Tried pt cell and home again about an hour later - still no answer, sent my chart to pt

## 2020-03-11 NOTE — Telephone Encounter (Signed)
Called her home and cell again today- LMOM on cell Also called her son -no answer

## 2020-03-11 NOTE — Telephone Encounter (Signed)
addnd 1/22- I also called her emergency contact, her son Stephanie Parks yesterday and [facility] that I was trying to reach his mom,please have her check her messages

## 2020-03-12 ENCOUNTER — Other Ambulatory Visit: Payer: Self-pay

## 2020-03-12 ENCOUNTER — Encounter (HOSPITAL_BASED_OUTPATIENT_CLINIC_OR_DEPARTMENT_OTHER): Payer: Self-pay

## 2020-03-12 ENCOUNTER — Inpatient Hospital Stay (HOSPITAL_BASED_OUTPATIENT_CLINIC_OR_DEPARTMENT_OTHER)
Admission: EM | Admit: 2020-03-12 | Discharge: 2020-03-21 | DRG: 177 | Disposition: A | Discharge status: Home Health | Payer: Medicare Other | Attending: Family Medicine | Admitting: Family Medicine

## 2020-03-12 ENCOUNTER — Emergency Department (HOSPITAL_BASED_OUTPATIENT_CLINIC_OR_DEPARTMENT_OTHER): Payer: Medicare Other

## 2020-03-12 DIAGNOSIS — J9601 Acute respiratory failure with hypoxia: Secondary | ICD-10-CM | POA: Diagnosis present

## 2020-03-12 DIAGNOSIS — Z91013 Allergy to seafood: Secondary | ICD-10-CM | POA: Diagnosis not present

## 2020-03-12 DIAGNOSIS — Z8249 Family history of ischemic heart disease and other diseases of the circulatory system: Secondary | ICD-10-CM | POA: Diagnosis not present

## 2020-03-12 DIAGNOSIS — Z7982 Long term (current) use of aspirin: Secondary | ICD-10-CM | POA: Diagnosis not present

## 2020-03-12 DIAGNOSIS — Z882 Allergy status to sulfonamides status: Secondary | ICD-10-CM

## 2020-03-12 DIAGNOSIS — U071 COVID-19: Principal | ICD-10-CM | POA: Diagnosis present

## 2020-03-12 DIAGNOSIS — R0902 Hypoxemia: Secondary | ICD-10-CM

## 2020-03-12 DIAGNOSIS — K219 Gastro-esophageal reflux disease without esophagitis: Secondary | ICD-10-CM | POA: Diagnosis present

## 2020-03-12 DIAGNOSIS — I2510 Atherosclerotic heart disease of native coronary artery without angina pectoris: Secondary | ICD-10-CM | POA: Diagnosis present

## 2020-03-12 DIAGNOSIS — J309 Allergic rhinitis, unspecified: Secondary | ICD-10-CM | POA: Diagnosis present

## 2020-03-12 DIAGNOSIS — E876 Hypokalemia: Secondary | ICD-10-CM | POA: Diagnosis present

## 2020-03-12 DIAGNOSIS — K5790 Diverticulosis of intestine, part unspecified, without perforation or abscess without bleeding: Secondary | ICD-10-CM | POA: Diagnosis present

## 2020-03-12 DIAGNOSIS — Z87891 Personal history of nicotine dependence: Secondary | ICD-10-CM | POA: Diagnosis not present

## 2020-03-12 DIAGNOSIS — E875 Hyperkalemia: Secondary | ICD-10-CM | POA: Diagnosis not present

## 2020-03-12 DIAGNOSIS — J1282 Pneumonia due to coronavirus disease 2019: Secondary | ICD-10-CM | POA: Diagnosis present

## 2020-03-12 DIAGNOSIS — Z79899 Other long term (current) drug therapy: Secondary | ICD-10-CM

## 2020-03-12 DIAGNOSIS — E669 Obesity, unspecified: Secondary | ICD-10-CM | POA: Diagnosis present

## 2020-03-12 DIAGNOSIS — I10 Essential (primary) hypertension: Secondary | ICD-10-CM | POA: Diagnosis present

## 2020-03-12 DIAGNOSIS — R0602 Shortness of breath: Secondary | ICD-10-CM

## 2020-03-12 DIAGNOSIS — Z888 Allergy status to other drugs, medicaments and biological substances status: Secondary | ICD-10-CM

## 2020-03-12 DIAGNOSIS — I2602 Saddle embolus of pulmonary artery with acute cor pulmonale: Secondary | ICD-10-CM | POA: Diagnosis not present

## 2020-03-12 DIAGNOSIS — G9341 Metabolic encephalopathy: Secondary | ICD-10-CM | POA: Diagnosis present

## 2020-03-12 DIAGNOSIS — E785 Hyperlipidemia, unspecified: Secondary | ICD-10-CM | POA: Diagnosis present

## 2020-03-12 DIAGNOSIS — R7303 Prediabetes: Secondary | ICD-10-CM | POA: Diagnosis present

## 2020-03-12 DIAGNOSIS — R7989 Other specified abnormal findings of blood chemistry: Secondary | ICD-10-CM | POA: Diagnosis not present

## 2020-03-12 DIAGNOSIS — Z6832 Body mass index (BMI) 32.0-32.9, adult: Secondary | ICD-10-CM

## 2020-03-12 DIAGNOSIS — I2699 Other pulmonary embolism without acute cor pulmonale: Secondary | ICD-10-CM | POA: Diagnosis not present

## 2020-03-12 LAB — D-DIMER, QUANTITATIVE: D-Dimer, Quant: 1.58 ug/mL-FEU — ABNORMAL HIGH (ref 0.00–0.50)

## 2020-03-12 LAB — COMPREHENSIVE METABOLIC PANEL
ALT: 26 U/L (ref 0–44)
AST: 58 U/L — ABNORMAL HIGH (ref 15–41)
Albumin: 3.2 g/dL — ABNORMAL LOW (ref 3.5–5.0)
Alkaline Phosphatase: 58 U/L (ref 38–126)
Anion gap: 13 (ref 5–15)
BUN: 15 mg/dL (ref 8–23)
CO2: 32 mmol/L (ref 22–32)
Calcium: 8.4 mg/dL — ABNORMAL LOW (ref 8.9–10.3)
Chloride: 98 mmol/L (ref 98–111)
Creatinine, Ser: 0.98 mg/dL (ref 0.44–1.00)
GFR, Estimated: >60 mL/min (ref >60)
Glucose, Bld: 125 mg/dL — ABNORMAL HIGH (ref 70–99)
Potassium: 2.7 mmol/L — CL (ref 3.5–5.1)
Sodium: 143 mmol/L (ref 135–145)
Total Bilirubin: 0.7 mg/dL (ref 0.3–1.2)
Total Protein: 7.4 g/dL (ref 6.5–8.1)

## 2020-03-12 LAB — CBC WITH DIFFERENTIAL/PLATELET
Abs Immature Granulocytes: 0 10*3/uL (ref 0.00–0.07)
Basophils Absolute: 0 10*3/uL (ref 0.0–0.1)
Basophils Relative: 0 %
Eosinophils Absolute: 0 10*3/uL (ref 0.0–0.5)
Eosinophils Relative: 0 %
HCT: 39.1 % (ref 36.0–46.0)
Hemoglobin: 12.5 g/dL (ref 12.0–15.0)
Lymphocytes Relative: 13 %
Lymphs Abs: 0.9 10*3/uL (ref 0.7–4.0)
MCH: 27.9 pg (ref 26.0–34.0)
MCHC: 32 g/dL (ref 30.0–36.0)
MCV: 87.3 fL (ref 80.0–100.0)
Monocytes Absolute: 0.4 10*3/uL (ref 0.1–1.0)
Monocytes Relative: 6 %
Neutro Abs: 5.4 10*3/uL (ref 1.7–7.7)
Neutrophils Relative %: 81 %
Platelets: 354 10*3/uL (ref 150–400)
RBC: 4.48 MIL/uL (ref 3.87–5.11)
RDW: 14.9 % (ref 11.5–15.5)
WBC: 6.7 10*3/uL (ref 4.0–10.5)
nRBC: 0 % (ref 0.0–0.2)

## 2020-03-12 LAB — TROPONIN I (HIGH SENSITIVITY)
Troponin I (High Sensitivity): 18 ng/L — ABNORMAL HIGH (ref <18)
Troponin I (High Sensitivity): 20 ng/L — ABNORMAL HIGH (ref <18)

## 2020-03-12 LAB — PROCALCITONIN: Procalcitonin: <0.1 ng/mL

## 2020-03-12 LAB — SARS CORONAVIRUS 2 BY RT PCR (HOSPITAL ORDER, PERFORMED IN ~~LOC~~ HOSPITAL LAB): SARS Coronavirus 2: POSITIVE — AB

## 2020-03-12 LAB — TRIGLYCERIDES: Triglycerides: 98 mg/dL (ref <150)

## 2020-03-12 LAB — BRAIN NATRIURETIC PEPTIDE: B Natriuretic Peptide: 34.6 pg/mL (ref 0.0–100.0)

## 2020-03-12 LAB — FIBRINOGEN: Fibrinogen: 722 mg/dL — ABNORMAL HIGH (ref 210–475)

## 2020-03-12 LAB — LACTIC ACID, PLASMA
Lactic Acid, Venous: 1.6 mmol/L (ref 0.5–1.9)
Lactic Acid, Venous: 1.1 mmol/L (ref 0.5–1.9)

## 2020-03-12 LAB — LACTATE DEHYDROGENASE: LDH: 607 U/L — ABNORMAL HIGH (ref 98–192)

## 2020-03-12 LAB — MAGNESIUM: Magnesium: 2 mg/dL (ref 1.7–2.4)

## 2020-03-12 MED ORDER — POTASSIUM CHLORIDE 10 MEQ/100ML IV SOLN
10.0000 meq | INTRAVENOUS | Status: AC
Start: 2020-03-12 — End: 2020-03-12
  Administered 2020-03-12 (×3): 10 meq via INTRAVENOUS
  Filled 2020-03-12 (×3): qty 100

## 2020-03-12 MED ORDER — SODIUM CHLORIDE 0.9 % IV SOLN: INTRAVENOUS | Status: DC | PRN

## 2020-03-12 MED ORDER — ASCORBIC ACID 500 MG PO TABS
500.0000 mg | ORAL_TABLET | Freq: Every day | ORAL | Status: DC
  Administered 2020-03-12 – 2020-03-16 (×5): 500 mg via ORAL
  Filled 2020-03-12 (×5): qty 1

## 2020-03-12 MED ORDER — SODIUM CHLORIDE 0.9 % IV SOLN
100.0000 mg | INTRAVENOUS | Status: AC
  Administered 2020-03-12 (×2): 100 mg via INTRAVENOUS

## 2020-03-12 MED ORDER — POTASSIUM CHLORIDE CRYS ER 20 MEQ PO TBCR
40.0000 meq | EXTENDED_RELEASE_TABLET | Freq: Once | ORAL | Status: AC
  Administered 2020-03-12: 40 meq via ORAL
  Filled 2020-03-12: qty 2

## 2020-03-12 MED ORDER — SODIUM CHLORIDE 0.9 % IV SOLN
100.0000 mg | Freq: Every day | INTRAVENOUS | Status: AC
  Administered 2020-03-13 – 2020-03-16 (×4): 100 mg via INTRAVENOUS
  Filled 2020-03-12 (×4): qty 20

## 2020-03-12 MED ORDER — IPRATROPIUM-ALBUTEROL 20-100 MCG/ACT IN AERS
1.0000 | INHALATION_SPRAY | Freq: Four times a day (QID) | RESPIRATORY_TRACT | Status: DC
  Administered 2020-03-12 – 2020-03-21 (×31): 1 via RESPIRATORY_TRACT
  Filled 2020-03-12: qty 4

## 2020-03-12 MED ORDER — DEXAMETHASONE SODIUM PHOSPHATE 4 MG/ML IJ SOLN
4.0000 mg | Freq: Once | INTRAMUSCULAR | Status: AC
  Administered 2020-03-12: 4 mg via INTRAVENOUS
  Filled 2020-03-12: qty 1

## 2020-03-12 MED ORDER — METHYLPREDNISOLONE SODIUM SUCC 125 MG IJ SOLR
60.0000 mg | Freq: Two times a day (BID) | INTRAMUSCULAR | Status: DC
  Administered 2020-03-12 – 2020-03-19 (×14): 60 mg via INTRAVENOUS
  Filled 2020-03-12 (×14): qty 2

## 2020-03-12 MED ORDER — ZINC SULFATE 220 (50 ZN) MG PO CAPS
220.0000 mg | ORAL_CAPSULE | Freq: Every day | ORAL | Status: DC
  Administered 2020-03-12 – 2020-03-16 (×5): 220 mg via ORAL
  Filled 2020-03-12 (×5): qty 1

## 2020-03-12 NOTE — ED Triage Notes (Signed)
Pt arrives stating she hasnt been feeling well for a few weeks. Son states confusion since Friday. Short of breath for the past week. Spo2 85% in triage booth, 74% after moving to bed. Placed on O2 via [place], RT at bedside. Home covid test negative yesterday.

## 2020-03-12 NOTE — ED Notes (Signed)
K+ 2.7, results given to ED MD 

## 2020-03-12 NOTE — ED Notes (Signed)
Pt transferred via [place]. VSS. No acute distress noted.

## 2020-03-12 NOTE — Progress Notes (Signed)
75 year old BF PMHx HTN, CAD, HLD, brain aneurysm, hypokalemia, prediabetes, Presented [facility] SOB for 2 weeks, worse in 2 days. Noted to be hypoxic in triage. Fully vaccinated and received booster shot. Noted to be hypoxic SPO2 84% on room air, now on 5 L O2 SPO2 90% COVID inflammatory markers have not returned. Requested they start patient on the following: - Remdesivir per pharmacy protocol - Solu-Medrol 60 mg BID -Vitamins per COVID protocol - Flutter valve - Incentive spirometry - COVID inflammatory markers pending - Patient was hypokalemic and they have replaced potassium.

## 2020-03-12 NOTE — ED Notes (Signed)
RT instructed patient with IS and flutter, and then was unable to get SAT above 88% on regular nasal canula. Placed on HFNC % 10L

## 2020-03-12 NOTE — ED Provider Notes (Signed)
[facility] EMERGENCY DEPARTMENT Provider Note   CSN: [id number] Arrival date & time: 03/12/20  1350     History Chief Complaint  Patient presents with  . Altered Mental Status  . Shortness of Breath    Stephanie Parks is a 75 y.o. female.  HPI      75yo female with history of hypertension, hyperlipidemia, aneurysm who presents with concern for altered mental status and cough.  She has been coughing for the last 2 weeks and had some shortness of breath. Denies chest pain. No leg swelling. Had nausea and vomiting today. Has had diarrhea last few days.  For last 2 days has been generally weak, fatigued, sleepy. Son reports today she was disoriented and like her face was twisted. No facial droop/no numbness/weakness one side or other. Had a negative at home covid test yesterday.    Has had vaccine and booster  Past Medical History:  Diagnosis Date  . 3-vessel CAD 03/01/2015  . Achalasia 09/28/2012  . Allergic rhinitis 03/01/2015  . Anemia   . Aneurysm ([place]) 03/01/2015  . BP (high blood pressure) 03/01/2015  . Bronchitis 04/23/2018  . Cephalalgia 08/24/2012   Overview:  ICD-10 cut over    . Chest pain 04/04/2011  . CN (constipation) 09/28/2012  . Coughing 04/24/2017  . Decreased potassium in the blood 03/01/2015  . Diverticulosis   . Dizziness 03/01/2015  . GERD (gastroesophageal reflux disease)   . Hiatal hernia   . Hypercholesterolemia 03/01/2015  . Hyperlipidemia   . Hypertension   . Ingrown toenail 08/10/2017  . Inguinal hernia   . Migraine headache   . Onychomycosis 12/08/2017  . Paresthesia of arm 03/01/2015  . Schatzki's ring   . Tendonitis, Achilles, right 12/08/2017  . Unilateral primary osteoarthritis, left knee 10/07/2017    Patient Active Problem List   Diagnosis Date Noted  . Pneumonia due to COVID-19 virus 03/12/2020  . Seasonal allergic conjunctivitis 10/04/2019  . Dysfunction of right eustachian tube 10/04/2019  . Pre-diabetes 06/02/2019  .  Seasonal and perennial allergic rhinitis 12/22/2018  . Heart palpitations 12/22/2018  . History of chest pain 07/20/2018  . Brain aneurysm 07/20/2018  . Bronchitis 04/23/2018  . Tendonitis, Achilles, right 12/08/2017  . Onychomycosis 12/08/2017  . Unilateral primary osteoarthritis, left knee 10/07/2017  . Ingrown toenail 08/10/2017  . Coughing 04/24/2017  . CAD (coronary artery disease) 03/01/2015  . Aneurysm ([place]) 03/01/2015  . Dizziness 03/01/2015  . Paresthesia of arm 03/01/2015  . Allergic rhinitis 03/01/2015  . Achalasia 09/28/2012  . CN (constipation) 09/28/2012  . Cephalalgia 08/24/2012  . HTN (hypertension) 04/19/2011  . Hyperlipidemia 04/19/2011  . GERD (gastroesophageal reflux disease) 04/19/2011  . Chest pain 04/04/2011    Past Surgical History:  Procedure Laterality Date  . ABDOMINAL HYSTERECTOMY    . BACK SURGERY     x 3  . CARPAL TUNNEL RELEASE     left wrist  . CERVICAL SPINE SURGERY    . HEMORRHOID SURGERY       OB History   No obstetric history on file.     Family History  Problem Relation Age of Onset  . Allergic rhinitis Sister   . Diabetes Sister   . Hypertension Sister   . Heart attack Father 36  . Heart disease Father   . Stomach cancer Maternal Grandmother   . Heart disease Mother   . Colon cancer Neg Hx   . Angioedema Neg Hx   . Asthma Neg Hx   .  Eczema Neg Hx   . Urticaria Neg Hx   . Immunodeficiency Neg Hx   . Breast cancer Neg Hx   . Migraines Neg Hx   . Headache Neg Hx     Social History   Tobacco Use  . Smoking status: Former Smoker    Packs/day: 0.50    Years: 20.00    Pack years: 10.00    Types: Cigarettes    Quit date: 02/19/1984    Years since quitting: 36.0  . Smokeless tobacco: Never Used  Vaping Use  . Vaping Use: Never used  Substance Use Topics  . Alcohol use: No  . Drug use: No    Home Medications Prior to Admission medications   Medication Sig Start Date End Date Taking? Authorizing Provider   acetaminophen (TYLENOL) 650 MG CR tablet Take 1,300 mg by mouth every 8 (eight) hours as needed for pain.    [provider]  ascorbic acid (VITAMIN C) 500 MG tablet Take 500 mg by mouth daily.    [provider]  aspirin 81 MG tablet Take 81 mg by mouth daily.    [provider]  Azelastine HCl 0.15 % SOLN Place 1-2 sprays into both nostrils 2 (two) times daily. Patient taking differently: Place 1-2 sprays into both nostrils 2 (two) times daily as needed (allergies). 12/22/18   Dara Hoyer, FNP  benzonatate (TESSALON) 100 MG capsule Take 1 capsule (100 mg total) by mouth 3 (three) times daily as needed for cough. 03/09/20   Copland, Gay Filler, MD  Calcium Carb-Cholecalciferol (CALCIUM 600 + D PO) Take 1 tablet by mouth daily.    [provider]  ELDERBERRY PO Take 50 mg by mouth daily.    [provider]  EPINEPHrine (EPIPEN 2-PAK) 0.3 mg/0.3 mL IJ SOAJ injection Inject 0.3 mLs (0.3 mg total) into the muscle as needed for anaphylaxis. Per allergen immunotherapy protocol 10/04/19   Ambs, Kathrine Cords, FNP  fluticasone (FLONASE) 50 MCG/ACT nasal spray USE 2 SPRAYS IN NOSTRIL(S) DAILY AS NEEDED.  In the right nostril point the applicator out toward your right ear.  In the left nostril point the applicator out toward her left ear Patient taking differently: Place 2 sprays into both nostrils daily as needed for allergies. 10/04/19   Dara Hoyer, FNP  losartan (COZAAR) 50 MG tablet Take 1 tablet (50 mg total) by mouth daily. 12/25/19   Copland, Gay Filler, MD  metoprolol succinate (TOPROL-XL) 25 MG 24 hr tablet Take 0.5 tablets (12.5 mg total) by mouth daily. 09/06/19   Copland, Gay Filler, MD  Multiple Vitamins-Minerals (MULTIVITAL) tablet Take 1 tablet by mouth daily.     [provider]  nortriptyline (PAMELOR) 25 MG capsule Take 25 mg by mouth at bedtime. 12/30/19   [provider]  pantoprazole (PROTONIX) 40 MG tablet Take 1 tablet (40 mg total) by  mouth 2 (two) times daily. 11/10/19   Pyrtle, Lajuan Lines, MD  Polyvinyl Alcohol-Povidone (REFRESH OP) Place 1 drop into both eyes daily as needed (dry eyes).    [provider]  potassium chloride SA (KLOR-CON) 20 MEQ tablet Take 2 pills (40 meq) once on day one, then take one daily as directed by MD 03/09/20   Copland, Gay Filler, MD  Probiotic Product (PROBIOTIC PO) Take 1 capsule by mouth daily.    [provider]  rizatriptan (MAXALT-MLT) 10 MG disintegrating tablet Take 10 mg by mouth as needed for migraine. May repeat in 2 hours if needed  [provider]  rosuvastatin (CRESTOR) 10 MG tablet TAKE 1 TABLET BY MOUTH EVERY DAY Patient taking differently: Take 10 mg by mouth daily. 01/11/20   Copland, Gwenlyn Found, MD  Sodium Chloride-Sodium Bicarb (NETI POT SINUS WASH) 2300-700 MG KIT Place 1 each into the nose daily as needed (sinuses).    [provider]  triamterene-hydrochlorothiazide (MAXZIDE-25) 37.5-25 MG tablet Take 0.5-1 tablets by mouth daily as needed (swelling). 03/09/20   Copland, Gwenlyn Found, MD  TUBERCULIN SYR 1CC/27GX1/2" (MONOJECT SYRINGE 27GX1/2") 27G X 1/2" 1 ML MISC 10 each by Does not apply route once as needed for up to 1 dose. 02/10/20   Hetty Blend, FNP  zinc gluconate 50 MG tablet Take 50 mg by mouth daily.    [provider]    Allergies    Shellfish allergy, Ace inhibitors, Gabapentin, Pitavastatin, Topiramate, and Sulfa antibiotics  Review of Systems   Review of Systems  Constitutional: Positive for activity change, appetite change and fatigue. Negative for fever.  HENT: Negative for congestion and sore throat.   Eyes: Negative for visual disturbance.  Respiratory: Positive for cough and shortness of breath.   Cardiovascular: Negative for chest pain.  Gastrointestinal: Positive for diarrhea, nausea and vomiting. Negative for abdominal pain and constipation.  Genitourinary: Negative for difficulty urinating.   Musculoskeletal: Negative for back pain and neck pain.  Skin: Negative for rash.  Neurological: Negative for syncope and headaches (a few days ago, resolved).  Psychiatric/Behavioral: Positive for confusion.    Physical Exam Updated Vital Signs BP 135/67   Pulse 99   Temp 100 F (37.8 C) (Oral)   Resp (!) 22   Ht 5\' 5"  (1.651 m)   Wt 91.6 kg   SpO2 96%   BMI 33.61 kg/m   Physical Exam Vitals and nursing note reviewed.  Constitutional:      General: She is not in acute distress.    Appearance: She is well-developed and well-nourished. She is not diaphoretic.  HENT:     Head: Normocephalic and atraumatic.  Eyes:     General: No visual field deficit.    Extraocular Movements: EOM normal.     Conjunctiva/sclera: Conjunctivae normal.  Cardiovascular:     Rate and Rhythm: Normal rate and regular rhythm.     Pulses: Intact distal pulses.     Heart sounds: Normal heart sounds. No murmur heard. No friction rub. No gallop.   Pulmonary:     Effort: Pulmonary effort is normal. No respiratory distress.     Breath sounds: Rales present. No wheezing.  Abdominal:     General: There is no distension.     Palpations: Abdomen is soft.     Tenderness: There is no abdominal tenderness. There is no guarding.  Musculoskeletal:        General: No tenderness or edema.     Cervical back: Normal range of motion.  Skin:    General: Skin is warm and dry.     Findings: No erythema or rash.  Neurological:     Mental Status: She is alert and oriented to person, place, and time.     GCS: GCS eye subscore is 4. GCS verbal subscore is 5. GCS motor subscore is 6.     Cranial Nerves: Cranial nerves are intact. No cranial nerve deficit, dysarthria or facial asymmetry.     Sensory: Sensation is intact. No sensory deficit.     Motor: Motor function is intact.     Coordination: Coordination is  intact.     ED Results / Procedures / Treatments   Labs (all labs ordered are listed, but only abnormal  results are displayed) Labs Reviewed  SARS CORONAVIRUS 2 BY RT PCR ([facility]) - Abnormal; Notable for the following components:      Result Value   SARS Coronavirus 2 POSITIVE (*)    All other components within normal limits  COMPREHENSIVE METABOLIC PANEL - Abnormal; Notable for the following components:   Potassium 2.7 (*)    Glucose, Bld 125 (*)    Calcium 8.4 (*)    Albumin 3.2 (*)    AST 58 (*)    All other components within normal limits  D-DIMER, QUANTITATIVE (NOT AT [facility]) - Abnormal; Notable for the following components:   D-Dimer, Quant 1.58 (*)    All other components within normal limits  LACTATE DEHYDROGENASE - Abnormal; Notable for the following components:   LDH 607 (*)    All other components within normal limits  FIBRINOGEN - Abnormal; Notable for the following components:   Fibrinogen 722 (*)    All other components within normal limits  TROPONIN I (HIGH SENSITIVITY) - Abnormal; Notable for the following components:   Troponin I (High Sensitivity) 18 (*)    All other components within normal limits  TROPONIN I (HIGH SENSITIVITY) - Abnormal; Notable for the following components:   Troponin I (High Sensitivity) 20 (*)    All other components within normal limits  CULTURE, BLOOD (ROUTINE X 2)  CULTURE, BLOOD (ROUTINE X 2)  CBC WITH DIFFERENTIAL/PLATELET  LACTIC ACID, PLASMA  LACTIC ACID, PLASMA  BRAIN NATRIURETIC PEPTIDE  PROCALCITONIN  TRIGLYCERIDES  MAGNESIUM  URINALYSIS, ROUTINE W REFLEX MICROSCOPIC  FERRITIN  C-REACTIVE PROTEIN  COMPREHENSIVE METABOLIC PANEL  MAGNESIUM  PHOSPHORUS  C-REACTIVE PROTEIN  D-DIMER, QUANTITATIVE (NOT AT [facility])  FERRITIN  CBC WITH DIFFERENTIAL/PLATELET  HEPATITIS PANEL, ACUTE    EKG EKG Interpretation  Date/Time:  Sunday March 12 2020 14:20:24 EST Ventricular Rate:  118 PR Interval:    QRS Duration: 83 QT Interval:  313 QTC Calculation: 439 R Axis:     Text  Interpretation: Sinus tachycardia LVH by voltage Anterior Q waves, possibly due to LVH Since prior ECG, rate has increased Confirmed by Gareth Morgan [phone]) on 03/12/2020 2:40:12 PM   Radiology DG Chest Portable 1 View  Result Date: 03/12/2020 CLINICAL DATA:  Shortness of breath. EXAM: PORTABLE CHEST 1 VIEW COMPARISON:  December 06, 2019 FINDINGS: The mediastinal contour and cardiac silhouette are normal. Diffuse increased pulmonary interstitium is identified bilaterally. There is no focal pneumonia or pleural effusion. The bony structures are normal. IMPRESSION: Mild interstitial edema. Electronically Signed   By: Abelardo Diesel M.D.   On: 03/12/2020 15:49    Procedures Procedures (including critical care time)  Medications Ordered in ED Medications  remdesivir 100 mg in sodium chloride 0.9 % 100 mL IVPB (100 mg Intravenous New Bag/Given 03/12/20 2011)  remdesivir 100 mg in sodium chloride 0.9 % 100 mL IVPB (has no administration in time range)  methylPREDNISolone sodium succinate (SOLU-MEDROL) 125 mg/2 mL injection 60 mg (60 mg Intravenous Given 03/12/20 2003)  Ipratropium-Albuterol (COMBIVENT) respimat 1 puff (1 puff Inhalation Given 03/12/20 2023)  ascorbic acid (VITAMIN C) tablet 500 mg (500 mg Oral Given 03/12/20 2006)  zinc sulfate capsule 220 mg (220 mg Oral Given 03/12/20 2006)  0.9 %  sodium chloride infusion (has no administration in time range)  dexamethasone (DECADRON) injection 4 mg (4  mg Intravenous Given 03/12/20 1620)  potassium chloride 10 mEq in 100 mL IVPB (0 mEq Intravenous Stopped 03/12/20 1955)  potassium chloride SA (KLOR-CON) CR tablet 40 mEq (40 mEq Oral Given 03/12/20 1620)    ED Course  I have reviewed the triage vital signs and the nursing notes.  Pertinent labs & imaging results that were available during my care of the patient were reviewed by me and considered in my medical decision making (see chart for details).    MDM Rules/Calculators/A&P                            75yo female with history of hypertension, hyperlipidemia, aneurysm who presents with concern for altered mental status and cough.  Presents to the ED with oxygen saturations in the 80s, improved to 90s on 5L O2.  DDx includes COVID 19, CHF, pneumonia, PE.  Suspect hypoxia as etiology of encephalopathy. No focal findings on exam to suggest CVA or ICH.    Labs, COVID, XR testing pending. Signed out to Dr. Darl Householder with plan to admit.     Final Clinical Impression(s) / ED Diagnoses Final diagnoses:  COVID-19  Hypoxia    Rx / DC Orders ED Discharge Orders    None       Gareth Morgan, MD 03/12/20 2131

## 2020-03-12 NOTE — ED Notes (Signed)
Report called to Automotive engineer at [facility].  Called son Herbie [place] and informed him of transfer.

## 2020-03-12 NOTE — ED Notes (Signed)
Pt's. Fingers are quite cold; and I am having some difficulty in obtaining a consistent SPO2 pleth. Whenever she holds still and we get a good wave form, her SPO2 is in the mid 90's.

## 2020-03-12 NOTE — ED Triage Notes (Incomplete)
Pt arrives with son who states pt has had confusion since Friday. States passed

## 2020-03-12 NOTE — ED Notes (Signed)
Spoke with daughter and son on phone and updated on plan of care.

## 2020-03-12 NOTE — ED Provider Notes (Signed)
  Physical Exam  BP (!) 126/98   Pulse 93   Temp 100 F (37.8 C) (Oral)   Resp 16   Ht 5\' 5"  (1.651 m)   Wt 91.6 kg   SpO2 (!) 84%   BMI 33.61 kg/m   Physical Exam  ED Course/Procedures     Procedures  MDM  Care assumed from Dr. Billy Fischer at 3 pm. See her H & P for details. Patient is a 75 y/o F here with confusion, SOB for 2 weeks, worse in 2 days. Noted to be hypoxic in triage. Fully vaccinated and received booster shot. Sign out pending labs, COVID test   5:16 PM COVID positive. K 2.7, supplemented. On 5 L and O2 is around 90%. Ordered decadron, remdesivir. Hospitalist to admit for hypoxia from [place] Performed by: Wandra Arthurs   Total critical care time: 30 minutes  Critical care time was exclusive of separately billable procedures and treating other patients.  Critical care was necessary to treat or prevent imminent or life-threatening deterioration.  Critical care was time spent personally by me on the following activities: development of treatment plan with patient and/or surrogate as well as nursing, discussions with consultants, evaluation of patient's response to treatment, examination of patient, obtaining history from patient or surrogate, ordering and performing treatments and interventions, ordering and review of laboratory studies, ordering and review of radiographic studies, pulse oximetry and re-evaluation of patient's condition.    Drenda Freeze, MD 03/12/20 [phone]

## 2020-03-12 NOTE — Telephone Encounter (Signed)
Pt is in ER

## 2020-03-13 ENCOUNTER — Other Ambulatory Visit: Payer: Medicare Other

## 2020-03-13 ENCOUNTER — Inpatient Hospital Stay (HOSPITAL_COMMUNITY): Payer: Medicare Other

## 2020-03-13 ENCOUNTER — Encounter (HOSPITAL_COMMUNITY): Payer: Self-pay | Admitting: Internal Medicine

## 2020-03-13 ENCOUNTER — Other Ambulatory Visit: Payer: Self-pay

## 2020-03-13 DIAGNOSIS — I2510 Atherosclerotic heart disease of native coronary artery without angina pectoris: Secondary | ICD-10-CM

## 2020-03-13 DIAGNOSIS — U071 COVID-19: Principal | ICD-10-CM

## 2020-03-13 DIAGNOSIS — R7989 Other specified abnormal findings of blood chemistry: Secondary | ICD-10-CM

## 2020-03-13 DIAGNOSIS — I2699 Other pulmonary embolism without acute cor pulmonale: Secondary | ICD-10-CM

## 2020-03-13 DIAGNOSIS — I10 Essential (primary) hypertension: Secondary | ICD-10-CM

## 2020-03-13 DIAGNOSIS — J1282 Pneumonia due to coronavirus disease 2019: Secondary | ICD-10-CM | POA: Diagnosis not present

## 2020-03-13 HISTORY — DX: Other pulmonary embolism without acute cor pulmonale: I26.99

## 2020-03-13 LAB — COMPREHENSIVE METABOLIC PANEL
ALT: 24 U/L (ref 0–44)
AST: 49 U/L — ABNORMAL HIGH (ref 15–41)
Albumin: 2.8 g/dL — ABNORMAL LOW (ref 3.5–5.0)
Alkaline Phosphatase: 55 U/L (ref 38–126)
Anion gap: 11 (ref 5–15)
BUN: 14 mg/dL (ref 8–23)
CO2: 31 mmol/L (ref 22–32)
Calcium: 8.1 mg/dL — ABNORMAL LOW (ref 8.9–10.3)
Chloride: 103 mmol/L (ref 98–111)
Creatinine, Ser: 0.76 mg/dL (ref 0.44–1.00)
GFR, Estimated: >60 mL/min (ref >60)
Glucose, Bld: 155 mg/dL — ABNORMAL HIGH (ref 70–99)
Potassium: 3.1 mmol/L — ABNORMAL LOW (ref 3.5–5.1)
Sodium: 145 mmol/L (ref 135–145)
Total Bilirubin: 0.4 mg/dL (ref 0.3–1.2)
Total Protein: 6.6 g/dL (ref 6.5–8.1)

## 2020-03-13 LAB — CBC WITH DIFFERENTIAL/PLATELET
Abs Immature Granulocytes: 0.11 10*3/uL — ABNORMAL HIGH (ref 0.00–0.07)
Basophils Absolute: 0 10*3/uL (ref 0.0–0.1)
Basophils Relative: 0 %
Eosinophils Absolute: 0 10*3/uL (ref 0.0–0.5)
Eosinophils Relative: 0 %
HCT: 35.2 % — ABNORMAL LOW (ref 36.0–46.0)
Hemoglobin: 11 g/dL — ABNORMAL LOW (ref 12.0–15.0)
Immature Granulocytes: 2 %
Lymphocytes Relative: 20 %
Lymphs Abs: 1.1 10*3/uL (ref 0.7–4.0)
MCH: 27.6 pg (ref 26.0–34.0)
MCHC: 31.3 g/dL (ref 30.0–36.0)
MCV: 88.2 fL (ref 80.0–100.0)
Monocytes Absolute: 0.2 10*3/uL (ref 0.1–1.0)
Monocytes Relative: 3 %
Neutro Abs: 4.1 10*3/uL (ref 1.7–7.7)
Neutrophils Relative %: 75 %
Platelets: 324 10*3/uL (ref 150–400)
RBC: 3.99 MIL/uL (ref 3.87–5.11)
RDW: 15.1 % (ref 11.5–15.5)
WBC: 5.5 10*3/uL (ref 4.0–10.5)
nRBC: 0.4 % — ABNORMAL HIGH (ref 0.0–0.2)

## 2020-03-13 LAB — HEPATITIS PANEL, ACUTE
HCV Ab: NONREACTIVE
Hep A IgM: NONREACTIVE
Hep B C IgM: NONREACTIVE
Hepatitis B Surface Ag: NONREACTIVE

## 2020-03-13 LAB — C-REACTIVE PROTEIN: CRP: 9.2 mg/dL — ABNORMAL HIGH (ref <1.0)

## 2020-03-13 LAB — D-DIMER, QUANTITATIVE: D-Dimer, Quant: 4.5 ug/mL-FEU — ABNORMAL HIGH (ref 0.00–0.50)

## 2020-03-13 LAB — FERRITIN: Ferritin: 268 ng/mL (ref 11–307)

## 2020-03-13 LAB — PHOSPHORUS: Phosphorus: 2.5 mg/dL (ref 2.5–4.6)

## 2020-03-13 LAB — MAGNESIUM: Magnesium: 2.1 mg/dL (ref 1.7–2.4)

## 2020-03-13 MED ORDER — BENZONATATE 100 MG PO CAPS: 100.0000 mg | ORAL_CAPSULE | Freq: Three times a day (TID) | ORAL | Status: DC | PRN

## 2020-03-13 MED ORDER — LOSARTAN POTASSIUM 50 MG PO TABS
50.0000 mg | ORAL_TABLET | Freq: Every day | ORAL | Status: DC
  Administered 2020-03-13 – 2020-03-21 (×9): 50 mg via ORAL
  Filled 2020-03-13 (×9): qty 1

## 2020-03-13 MED ORDER — IOHEXOL 350 MG/ML SOLN
100.0000 mL | Freq: Once | INTRAVENOUS | Status: AC | PRN
  Administered 2020-03-13: 100 mL via INTRAVENOUS

## 2020-03-13 MED ORDER — TRIAMTERENE-HCTZ 37.5-25 MG PO TABS
0.5000 | ORAL_TABLET | Freq: Every day | ORAL | Status: DC | PRN
  Filled 2020-03-13: qty 1

## 2020-03-13 MED ORDER — ACETAMINOPHEN 325 MG PO TABS
650.0000 mg | ORAL_TABLET | Freq: Four times a day (QID) | ORAL | Status: DC | PRN
  Administered 2020-03-14 – 2020-03-15 (×2): 650 mg via ORAL
  Filled 2020-03-13 (×2): qty 2

## 2020-03-13 MED ORDER — METOPROLOL SUCCINATE ER 25 MG PO TB24
12.5000 mg | ORAL_TABLET | Freq: Every day | ORAL | Status: DC
Start: 2020-03-13 — End: 2020-03-21
  Administered 2020-03-13 – 2020-03-21 (×9): 12.5 mg via ORAL
  Filled 2020-03-13 (×9): qty 1

## 2020-03-13 MED ORDER — POTASSIUM CHLORIDE CRYS ER 20 MEQ PO TBCR
20.0000 meq | EXTENDED_RELEASE_TABLET | Freq: Every day | ORAL | Status: DC
  Administered 2020-03-13: 20 meq via ORAL
  Filled 2020-03-13: qty 1

## 2020-03-13 MED ORDER — ADULT MULTIVITAMIN W/MINERALS CH
1.0000 | ORAL_TABLET | Freq: Every day | ORAL | Status: DC
  Administered 2020-03-13 – 2020-03-16 (×4): 1 via ORAL
  Filled 2020-03-13 (×4): qty 1

## 2020-03-13 MED ORDER — ROSUVASTATIN CALCIUM 10 MG PO TABS
10.0000 mg | ORAL_TABLET | Freq: Every day | ORAL | Status: DC
  Administered 2020-03-13 – 2020-03-21 (×9): 10 mg via ORAL
  Filled 2020-03-13 (×9): qty 1

## 2020-03-13 MED ORDER — ONDANSETRON HCL 4 MG PO TABS: 4.0000 mg | ORAL_TABLET | Freq: Four times a day (QID) | ORAL | Status: DC | PRN

## 2020-03-13 MED ORDER — BARICITINIB 2 MG PO TABS
4.0000 mg | ORAL_TABLET | Freq: Every day | ORAL | Status: DC
  Administered 2020-03-13 – 2020-03-18 (×6): 4 mg via ORAL
  Filled 2020-03-13 (×7): qty 2

## 2020-03-13 MED ORDER — ENOXAPARIN SODIUM 40 MG/0.4ML ~~LOC~~ SOLN
40.0000 mg | SUBCUTANEOUS | Status: DC
  Filled 2020-03-13: qty 0.4

## 2020-03-13 MED ORDER — ENOXAPARIN SODIUM 100 MG/ML ~~LOC~~ SOLN
90.0000 mg | Freq: Two times a day (BID) | SUBCUTANEOUS | Status: DC
  Administered 2020-03-14 – 2020-03-19 (×11): 90 mg via SUBCUTANEOUS
  Filled 2020-03-13 (×11): qty 1

## 2020-03-13 MED ORDER — PANTOPRAZOLE SODIUM 40 MG PO TBEC
40.0000 mg | DELAYED_RELEASE_TABLET | Freq: Two times a day (BID) | ORAL | Status: DC
  Administered 2020-03-13 – 2020-03-21 (×17): 40 mg via ORAL
  Filled 2020-03-13 (×18): qty 1

## 2020-03-13 MED ORDER — NORTRIPTYLINE HCL 25 MG PO CAPS
25.0000 mg | ORAL_CAPSULE | Freq: Every day | ORAL | Status: DC
  Administered 2020-03-13 – 2020-03-16 (×4): 25 mg via ORAL
  Filled 2020-03-13 (×5): qty 1

## 2020-03-13 MED ORDER — ONDANSETRON HCL 4 MG/2ML IJ SOLN: 4.0000 mg | Freq: Four times a day (QID) | INTRAMUSCULAR | Status: DC | PRN

## 2020-03-13 MED ORDER — ENOXAPARIN SODIUM 100 MG/ML ~~LOC~~ SOLN
90.0000 mg | SUBCUTANEOUS | Status: AC
  Administered 2020-03-13: 90 mg via SUBCUTANEOUS
  Filled 2020-03-13: qty 1

## 2020-03-13 MED ORDER — ASPIRIN 81 MG PO CHEW
81.0000 mg | CHEWABLE_TABLET | Freq: Every day | ORAL | Status: DC
  Administered 2020-03-13 – 2020-03-21 (×9): 81 mg via ORAL
  Filled 2020-03-13 (×9): qty 1

## 2020-03-13 MED ORDER — FLUTICASONE PROPIONATE 50 MCG/ACT NA SUSP
2.0000 | Freq: Every day | NASAL | Status: DC | PRN
  Filled 2020-03-13: qty 16

## 2020-03-13 MED ORDER — POTASSIUM CHLORIDE CRYS ER 20 MEQ PO TBCR
20.0000 meq | EXTENDED_RELEASE_TABLET | Freq: Two times a day (BID) | ORAL | Status: DC
  Administered 2020-03-13 – 2020-03-21 (×16): 20 meq via ORAL
  Filled 2020-03-13 (×16): qty 1

## 2020-03-13 NOTE — Progress Notes (Signed)
Bilateral lower extremity venous duplex has been completed. Preliminary results can be found in CV Proc through chart review.   03/13/20 12:09 PM Stephanie Parks RVT

## 2020-03-13 NOTE — Progress Notes (Signed)
PROGRESS NOTE  Brief Narrative: Stephanie Parks is a 75 y.o. female with a history of HTN, HLD, hypokalemia, and triple-vaccinated against covid who presented to the ED 1/23 for some confusion for a few days, shortness of breath for a week, also reporting floaters intermittently for 2 weeks. She underwent CTA chest on 1/21 as an outpatient per her PCP after d-dimer resulted positive as part of work up for shortness of breath/chest discomfort. This was suggestive of multifocal pneumonia. PCP was unable to reach patient with these results, despite many attempts. In triage the patient was hypoxic requiring 5L HFNC, SARS-CoV-2 PCR positive, CRP 9.2, PCT and WBC wnl, BNP 34, troponin 18. Remdesivir, steroids were given and admission requested. Due to progressive hypoxia to requiring 15L, baricitinib was added. D-dimer has risen significantly, CTA chest is reordered. She was admitted this morning by Dr. Alcario Drought.  Subjective: She is a circuitous historian though remains oriented and reliable for history. Feels short of breath at rest without current chest pain. Eating lunch, feels very weak and blames low potassium. No new leg swelling, DVT not seen on U/S this AM.  Objective: BP 128/74 (BP Location: Right Arm)   Pulse 94   Temp 97.9 F (36.6 C) (Oral)   Resp 20   Ht 5\' 5"  (1.651 m)   Wt 89 kg   SpO2 100%   BMI 32.65 kg/m   Gen: Elderly, pleasant female in no distress eating lunch Pulm: Nonlabored but tachypneic with scant crackles diffusely  CV: Regular borderline tachycardia, no murmur, no JVD, no edema GI: Soft, NT, ND, +BS  Neuro: Alert and oriented. No focal deficits. Skin: No rashes, lesions or ulcers  Assessment & Plan: Principal Problem:   Pneumonia due to COVID-19 virus Active Problems:   HTN (hypertension)   CAD (coronary artery disease)  Acute hypoxemic respiratory failure due to covid-19 pneumonia: In pt vaccinated x3, +on 1/23 with typical CTA chest findings dating to 1/21.    - D-dimer up from 1 > 4 over past few hours, hypoxia concomitantly worsening abruptly. Ordered high dose lovenox, repeat CTA chest to which the patient consents.  - Remdesivir, solumedrol, baricitinib all indicated and being given.  - Does not appear volume overloaded currently.   Hypokalemia:  - Supplementing, will monitor and hold thiazide for now.   CAD: Has been evaluated by Dr. Irish Lack. Troponin very mildly elevated with flat trend and no current chest pain or ischemic ECG changes (suggestive of LVH however) - No inpatient evaluation planned.   Allergic rhinitis:  - Restart home flonase  Patrecia Pour, MD Pager on amion 03/13/2020, 2:15 PM

## 2020-03-13 NOTE — Progress Notes (Signed)
Dr. Bonner Puna reported CT findings PE in right lungs. Dr will re-order Lovenox which patient refused twice today. Pt in bed resting will continue to monitor.

## 2020-03-13 NOTE — Plan of Care (Signed)
  Problem: Education: Goal: Knowledge of risk factors and measures for prevention of condition will improve Outcome: Progressing   Problem: Coping: Goal: Psychosocial and spiritual needs will be supported Outcome: Progressing   Problem: Respiratory: Goal: Will maintain a patent airway Outcome: Progressing Goal: Complications related to the disease process, condition or treatment will be avoided or minimized Outcome: Progressing   Problem: Education: Goal: Knowledge of General Education information will improve Description: Including pain rating scale, medication(s)/side effects and non-pharmacologic comfort measures Outcome: Progressing   Problem: Health Behavior/Discharge Planning: Goal: Ability to manage health-related needs will improve Outcome: Progressing   Problem: Clinical Measurements: Goal: Ability to maintain clinical measurements within normal limits will improve Outcome: Progressing Goal: Will remain free from infection Outcome: Progressing Goal: Diagnostic test results will improve Outcome: Progressing Goal: Respiratory complications will improve Outcome: Progressing   Problem: Activity: Goal: Risk for activity intolerance will decrease Outcome: Progressing   Problem: Nutrition: Goal: Adequate nutrition will be maintained Outcome: Progressing   Problem: Coping: Goal: Level of anxiety will decrease Outcome: Progressing   Problem: Elimination: Goal: Will not experience complications related to bowel motility Outcome: Progressing Goal: Will not experience complications related to urinary retention Outcome: Progressing   Problem: Pain Managment: Goal: General experience of comfort will improve Outcome: Progressing   Problem: Safety: Goal: Ability to remain free from injury will improve Outcome: Progressing   Problem: Skin Integrity: Goal: Risk for impaired skin integrity will decrease Outcome: Progressing

## 2020-03-13 NOTE — Plan of Care (Signed)
  Problem: Education: Goal: Knowledge of risk factors and measures for prevention of condition will improve Outcome: Progressing   Problem: Coping: Goal: Psychosocial and spiritual needs will be supported Outcome: Progressing   Problem: Respiratory: Goal: Will maintain a patent airway Outcome: Progressing Goal: Complications related to the disease process, condition or treatment will be avoided or minimized Outcome: Progressing   Problem: Education: Goal: Knowledge of General Education information will improve Description: Including pain rating scale, medication(s)/side effects and non-pharmacologic comfort measures Outcome: Progressing   Problem: Health Behavior/Discharge Planning: Goal: Ability to manage health-related needs will improve Outcome: Progressing   Problem: Clinical Measurements: Goal: Ability to maintain clinical measurements within normal limits will improve Outcome: Progressing Goal: Will remain free from infection Outcome: Progressing Goal: Diagnostic test results will improve Outcome: Progressing Goal: Respiratory complications will improve Outcome: Progressing   Problem: Activity: Goal: Risk for activity intolerance will decrease Outcome: Progressing   Problem: Nutrition: Goal: Adequate nutrition will be maintained Outcome: Progressing   Problem: Coping: Goal: Level of anxiety will decrease Outcome: Progressing   Problem: Elimination: Goal: Will not experience complications related to bowel motility Outcome: Progressing Goal: Will not experience complications related to urinary retention Outcome: Progressing   Problem: Pain Managment: Goal: General experience of comfort will improve Outcome: Progressing   Problem: Safety: Goal: Ability to remain free from injury will improve Outcome: Progressing

## 2020-03-13 NOTE — Progress Notes (Signed)
ANTICOAGULATION CONSULT NOTE - Initial Consult  Pharmacy Consult for Lovenox Indication: pulmonary embolus  Allergies  Allergen Reactions  . Shellfish Allergy Hives and Swelling  . Ace Inhibitors Other (See Comments)    Pt cannot recall her reaction but tolerates arb   . Gabapentin Other (See Comments)    headaches  . Pitavastatin     Unknown reaction   . Topiramate     Heart race,   . Sulfa Antibiotics Other (See Comments) and Rash    Fine bumps    Patient Measurements: Height: 5\' 5"  (165.1 cm) Weight: 89 kg (196 lb 3.4 oz) IBW/kg (Calculated) : 57  Vital Signs: Temp: 97.9 F (36.6 C) (01/24 1338) Temp Source: Oral (01/24 1338) BP: 128/74 (01/24 1338) Pulse Rate: 94 (01/24 1338)  Labs: Recent Labs    03/12/20 1502 03/12/20 1503 03/12/20 1649 03/13/20 0410  HGB 12.5  --   --  11.0*  HCT 39.1  --   --  35.2*  PLT 354  --   --  324  CREATININE 0.98  --   --  0.76  TROPONINIHS  --  18* 20*  --     Estimated Creatinine Clearance: 68 mL/min (by C-G formula based on SCr of 0.76 mg/dL).   Medical History: Past Medical History:  Diagnosis Date  . 3-vessel CAD 03/01/2015  . Achalasia 09/28/2012  . Allergic rhinitis 03/01/2015  . Anemia   . Aneurysm ([place]) 03/01/2015  . BP (high blood pressure) 03/01/2015  . Bronchitis 04/23/2018  . Cephalalgia 08/24/2012   Overview:  ICD-10 cut over    . Chest pain 04/04/2011  . CN (constipation) 09/28/2012  . Coughing 04/24/2017  . Decreased potassium in the blood 03/01/2015  . Diverticulosis   . Dizziness 03/01/2015  . GERD (gastroesophageal reflux disease)   . Hiatal hernia   . Hypercholesterolemia 03/01/2015  . Hyperlipidemia   . Hypertension   . Ingrown toenail 08/10/2017  . Inguinal hernia   . Migraine headache   . Onychomycosis 12/08/2017  . Paresthesia of arm 03/01/2015  . Schatzki's ring   . Tendonitis, Achilles, right 12/08/2017  . Unilateral primary osteoarthritis, left knee 10/07/2017    Assessment: 75 yo F with COVID  PNA.  Today, Chest CT + PNA, no evidence of right heart strain.   Lovenox 40mg  ordered for VTE px on admission however today's dose not given/charted as patient refused.   CBC- Hg slightly low 11, pltc WNL.  Scr at baseline, CrCl >74ml/min.   Goal of Therapy:  Anti-Xa level 0.6-1 units/ml 4hrs after LMWH dose given Monitor platelets by anticoagulation protocol: Yes   Plan:  Lovenox 90mg  SQ q12h Monitor CBC, s/sx of bleeding  [name] PharmD, BCPS 03/13/2020,7:28 PM

## 2020-03-13 NOTE — Plan of Care (Signed)
  Problem: Coping: Goal: Psychosocial and spiritual needs will be supported Outcome: Progressing   Problem: Respiratory: Goal: Will maintain a patent airway Outcome: Progressing   Problem: Clinical Measurements: Goal: Diagnostic test results will improve Outcome: Progressing Goal: Respiratory complications will improve Outcome: Progressing   Problem: Activity: Goal: Risk for activity intolerance will decrease Outcome: Progressing   Problem: Safety: Goal: Ability to remain free from injury will improve Outcome: Progressing

## 2020-03-13 NOTE — H&P (Signed)
History and Physical    AVAGRACE BOTELHO OVZ:[id number] DOB: [birth year] DOA: 03/12/2020  PCP: [name], MD  Patient coming from: Home  I have personally briefly reviewed patient's old medical records in [place]  Chief Complaint: SOB  HPI: Stephanie Parks is a 75 y.o. female with medical history significant of HTN, HLD, CAD.  Pt presents to the ED with c/o progressively worsening SOB for past 2 weeks.  Especially bad in the past 2 days.  Had diarrhea the past few days a swell.  Symptoms onset 2 weeks ago.  Symptoms are constant, now severe.  Pt has had COVID vaccines x2 doses of Pfizer.  No CP, mild leg swelling.   ED Course: COVID+, procalcitonin neg, WBC nl.  CTA chest is neg for PE, just shows COVID pneumonia findings.  Pt satting 80s on RA initially.  Improved to 90s initially on 5L O2.  While awaiting transport however O2 requirement has gradually increased.  Now needing 13L via HFNC to maintain sats in the 90s.   Review of Systems: As per HPI, otherwise all review of systems negative.  Past Medical History:  Diagnosis Date  . 3-vessel CAD 03/01/2015  . Achalasia 09/28/2012  . Allergic rhinitis 03/01/2015  . Anemia   . Aneurysm ([place]) 03/01/2015  . BP (high blood pressure) 03/01/2015  . Bronchitis 04/23/2018  . Cephalalgia 08/24/2012   Overview:  ICD-10 cut over    . Chest pain 04/04/2011  . CN (constipation) 09/28/2012  . Coughing 04/24/2017  . Decreased potassium in the blood 03/01/2015  . Diverticulosis   . Dizziness 03/01/2015  . GERD (gastroesophageal reflux disease)   . Hiatal hernia   . Hypercholesterolemia 03/01/2015  . Hyperlipidemia   . Hypertension   . Ingrown toenail 08/10/2017  . Inguinal hernia   . Migraine headache   . Onychomycosis 12/08/2017  . Paresthesia of arm 03/01/2015  . Schatzki's ring   . Tendonitis, Achilles, right 12/08/2017  . Unilateral primary osteoarthritis, left knee 10/07/2017    Past Surgical History:  Procedure  Laterality Date  . ABDOMINAL HYSTERECTOMY    . BACK SURGERY     x 3  . CARPAL TUNNEL RELEASE     left wrist  . CERVICAL SPINE SURGERY    . HEMORRHOID SURGERY       reports that she quit smoking about 36 years ago. Her smoking use included cigarettes. She has a 10.00 pack-year smoking history. She has never used smokeless tobacco. She reports that she does not drink alcohol and does not use drugs.  Allergies  Allergen Reactions  . Shellfish Allergy Hives and Swelling  . Ace Inhibitors Other (See Comments)    Pt cannot recall her reaction but tolerates arb   . Gabapentin Other (See Comments)    headaches  . Pitavastatin     Unknown reaction   . Topiramate     Heart race,   . Sulfa Antibiotics Other (See Comments) and Rash    Fine bumps    Family History  Problem Relation Age of Onset  . Allergic rhinitis Sister   . Diabetes Sister   . Hypertension Sister   . Heart attack Father 59  . Heart disease Father   . Stomach cancer Maternal Grandmother   . Heart disease Mother   . Colon cancer Neg Hx   . Angioedema Neg Hx   . Asthma Neg Hx   . Eczema Neg Hx   . Urticaria Neg Hx   .  Immunodeficiency Neg Hx   . Breast cancer Neg Hx   . Migraines Neg Hx   . Headache Neg Hx      Prior to Admission medications   Medication Sig Start Date End Date Taking? Authorizing Provider  acetaminophen (TYLENOL) 650 MG CR tablet Take 1,300 mg by mouth every 8 (eight) hours as needed for pain.    [provider]  ascorbic acid (VITAMIN C) 500 MG tablet Take 500 mg by mouth daily.    [provider]  aspirin 81 MG tablet Take 81 mg by mouth daily.    [provider]  Azelastine HCl 0.15 % SOLN Place 1-2 sprays into both nostrils 2 (two) times daily. Patient taking differently: Place 1-2 sprays into both nostrils 2 (two) times daily as needed (allergies). 12/22/18   Dara Hoyer, FNP  benzonatate (TESSALON) 100 MG capsule Take 1 capsule (100 mg total) by mouth 3  (three) times daily as needed for cough. 03/09/20   Copland, Gay Filler, MD  Calcium Carb-Cholecalciferol (CALCIUM 600 + D PO) Take 1 tablet by mouth daily.    [provider]  ELDERBERRY PO Take 50 mg by mouth daily.    [provider]  EPINEPHrine (EPIPEN 2-PAK) 0.3 mg/0.3 mL IJ SOAJ injection Inject 0.3 mLs (0.3 mg total) into the muscle as needed for anaphylaxis. Per allergen immunotherapy protocol 10/04/19   Ambs, Kathrine Cords, FNP  fluticasone (FLONASE) 50 MCG/ACT nasal spray USE 2 SPRAYS IN NOSTRIL(S) DAILY AS NEEDED.  In the right nostril point the applicator out toward your right ear.  In the left nostril point the applicator out toward her left ear Patient taking differently: Place 2 sprays into both nostrils daily as needed for allergies. 10/04/19   Dara Hoyer, FNP  losartan (COZAAR) 50 MG tablet Take 1 tablet (50 mg total) by mouth daily. 12/25/19   Copland, Gay Filler, MD  metoprolol succinate (TOPROL-XL) 25 MG 24 hr tablet Take 0.5 tablets (12.5 mg total) by mouth daily. 09/06/19   Copland, Gay Filler, MD  Multiple Vitamins-Minerals (MULTIVITAL) tablet Take 1 tablet by mouth daily.     [provider]  nortriptyline (PAMELOR) 25 MG capsule Take 25 mg by mouth at bedtime. 12/30/19   [provider]  pantoprazole (PROTONIX) 40 MG tablet Take 1 tablet (40 mg total) by mouth 2 (two) times daily. 11/10/19   Pyrtle, Lajuan Lines, MD  Polyvinyl Alcohol-Povidone (REFRESH OP) Place 1 drop into both eyes daily as needed (dry eyes).    [provider]  potassium chloride SA (KLOR-CON) 20 MEQ tablet Take 2 pills (40 meq) once on day one, then take one daily as directed by MD 03/09/20   Copland, Gay Filler, MD  Probiotic Product (PROBIOTIC PO) Take 1 capsule by mouth daily.    [provider]  rizatriptan (MAXALT-MLT) 10 MG disintegrating tablet Take 10 mg by mouth as needed for migraine. May repeat in 2 hours if needed    [provider]  rosuvastatin  (CRESTOR) 10 MG tablet TAKE 1 TABLET BY MOUTH EVERY DAY Patient taking differently: Take 10 mg by mouth daily. 01/11/20   Copland, Gay Filler, MD  Sodium Chloride-Sodium Bicarb (NETI POT SINUS WASH) 2300-700 MG KIT Place 1 each into the nose daily as needed (sinuses).    [provider]  triamterene-hydrochlorothiazide (MAXZIDE-25) 37.5-25 MG tablet Take 0.5-1 tablets by mouth daily as needed (swelling). 03/09/20   Copland, Gay Filler, MD  TUBERCULIN SYR 1CC/27GX1/2" (MONOJECT SYRINGE 1ML  27GX1/2") 27G X 1/2" 1 ML MISC 10 each by Does not apply route once as needed for up to 1 dose. 02/10/20   Dara Hoyer, FNP  zinc gluconate 50 MG tablet Take 50 mg by mouth daily.    [provider]    Physical Exam: Vitals:   03/12/20 2230 03/12/20 2245 03/12/20 2338 03/12/20 2348  BP: (!) 149/77  (!) 150/84   Pulse: 96 97 99   Resp: (!) 24 (!) 22 20   Temp:   98 F (36.7 C)   TempSrc:   Oral   SpO2: 97% 98% 90%   Weight:    89 kg  Height:    _0  (1.651 m)    Constitutional: NAD, calm, comfortable Eyes: PERRL, lids and conjunctivae normal ENMT: Mucous membranes are moist. Posterior pharynx clear of any exudate or lesions.Normal dentition.  Neck: normal, supple, no masses, no thyromegaly Respiratory: clear to auscultation bilaterally, no wheezing, no crackles. Normal respiratory effort. No accessory muscle use.  Cardiovascular: Regular rate and rhythm, no murmurs / rubs / gallops. No extremity edema. 2+ pedal pulses. No carotid bruits.  Abdomen: no tenderness, no masses palpated. No hepatosplenomegaly. Bowel sounds positive.  Musculoskeletal: no clubbing / cyanosis. No joint deformity upper and lower extremities. Good ROM, no contractures. Normal muscle tone.  Skin: no rashes, lesions, ulcers. No induration Neurologic: CN 2-12 grossly intact. Sensation intact, DTR normal. Strength 5/5 in all 4.  Psychiatric: Normal judgment and insight. Alert and oriented x 3. Normal mood.     Labs on Admission: I have personally reviewed following labs and imaging studies  CBC: Recent Labs  Lab 03/08/20 1631 03/12/20 1502  WBC 3.6* 6.7  NEUTROABS  --  5.4  HGB 11.7* 12.5  HCT 35.3* 39.1  MCV 84.9 87.3  PLT 236.0 128   Basic Metabolic Panel: Recent Labs  Lab 03/08/20 1631 03/12/20 1502 03/12/20 1503  NA 137 143  --   K 2.7* 2.7*  --   CL 95* 98  --   CO2 34* 32  --   GLUCOSE 99 125*  --   BUN 12 15  --   CREATININE 1.10 0.98  --   CALCIUM 8.5 8.4*  --   MG  --   --  2.0   GFR: Estimated Creatinine Clearance: 55.5 mL/min (by C-G formula based on SCr of 0.98 mg/dL). Liver Function Tests: Recent Labs  Lab 03/12/20 1502  AST 58*  ALT 26  ALKPHOS 58  BILITOT 0.7  PROT 7.4  ALBUMIN 3.2*   No results for input(s): LIPASE, AMYLASE in the last 168 hours. No results for input(s): AMMONIA in the last 168 hours. Coagulation Profile: No results for input(s): INR, PROTIME in the last 168 hours. Cardiac Enzymes: Recent Labs  Lab 03/08/20 1630  TROPONINI 7   BNP (last 3 results) Recent Labs    07/30/19 1614  PROBNP 99   HbA1C: No results for input(s): HGBA1C in the last 72 hours. CBG: No results for input(s): GLUCAP in the last 168 hours. Lipid Profile: Recent Labs    03/12/20 1503  TRIG 98   Thyroid Function Tests: No results for input(s): TSH, T4TOTAL, FREET4, T3FREE, THYROIDAB in the last 72 hours. Anemia Panel: No results for input(s): VITAMINB12, FOLATE, FERRITIN, TIBC, IRON, RETICCTPCT in the last 72 hours. Urine analysis:    Component Value Date/Time   COLORURINE YELLOW 10/31/2019 [place] 10/31/2019 1321   LABSPEC 1.015 10/31/2019 1321  PHURINE 8.5 (H) 10/31/2019 1321   GLUCOSEU NEGATIVE 10/31/2019 1321   HGBUR NEGATIVE 10/31/2019 1321   BILIRUBINUR NEGATIVE 10/31/2019 1321   BILIRUBINUR negative 11/20/2016 1654   KETONESUR NEGATIVE 10/31/2019 1321   PROTEINUR NEGATIVE 10/31/2019 1321   UROBILINOGEN 0.2  11/20/2016 1654   UROBILINOGEN 1.0 02/21/2010 1008   NITRITE NEGATIVE 10/31/2019 1321   LEUKOCYTESUR NEGATIVE 10/31/2019 1321    Radiological Exams on Admission: DG Chest Portable 1 View  Result Date: 03/12/2020 CLINICAL DATA:  Shortness of breath. EXAM: PORTABLE CHEST 1 VIEW COMPARISON:  December 06, 2019 FINDINGS: The mediastinal contour and cardiac silhouette are normal. Diffuse increased pulmonary interstitium is identified bilaterally. There is no focal pneumonia or pleural effusion. The bony structures are normal. IMPRESSION: Mild interstitial edema. Electronically Signed   By: Abelardo Diesel M.D.   On: 03/12/2020 15:49    EKG: Independently reviewed.  Assessment/Plan Principal Problem:   Pneumonia due to COVID-19 virus Active Problems:   HTN (hypertension)   CAD (coronary artery disease)    1. COVID-19 with acute hypoxic resp failure - 1. O2 requirement increasing over the afternoon, now 13L requirement up from 5 on presentation. 2. COVID pathway 3. remdesivir 4. Steroids 5. baricitinib 6. Daily labs 7. Cont pulse ox 8. Tele monitor 2. HTN - 1. Cont home BP meds 3. CAD - 1. Cont ASA, statin 4. Hypokalemia - replace, repeat lab in AM 5. AMS - Pt initially confused in the ED, seems improved at this time (either waxing / waning confusion of delirium, or just confused due to hypoxia which is now improved on oxygen).  DVT prophylaxis: Lovenox Code Status: Full Family Communication: No family in room Disposition Plan: Home after O2 requirement improved Consults called: None Admission status: Admit to inpatient  Severity of Illness: The appropriate patient status for this patient is INPATIENT. Inpatient status is judged to be reasonable and necessary in order to provide the required intensity of service to ensure the patient's safety. The patient's presenting symptoms, physical exam findings, and initial radiographic and laboratory data in the context of their chronic  comorbidities is felt to place them at high risk for further clinical deterioration. Furthermore, it is not anticipated that the patient will be medically stable for discharge from the hospital within 2 midnights of admission. The following factors support the patient status of inpatient.   IP status due to COVID-19 with new 13L O2 requirement.   * I certify that at the point of admission it is my clinical judgment that the patient will require inpatient hospital care spanning beyond 2 midnights from the point of admission due to high intensity of service, high risk for further deterioration and high frequency of surveillance required.*    Allexis Bordenave M. DO [facility]  How to contact the [facility] Attending or Consulting provider [place] or covering provider during after hours [place], for this patient?  1. Check the care team in [facility] and look for a) attending/consulting [facility] provider listed and b) the [facility] team listed 2. Log into [email or web address]  Amion Physician Scheduling and messaging for groups and whole hospitals  On call and physician scheduling software for group practices, residents, hospitalists and other medical providers for call, clinic, rotation and shift schedules. On[employer] is a hospital-wide system for scheduling doctors and paging doctors on call. EasyPlot is for scientific plotting and data analysis.  [email or web address]  and use [facility]'s universal password to access. If you do not have the password, please contact  the hospital operator.  3. Locate the [facility] provider you are looking for under [facility] and page to a number that you can be directly reached. 4. If you still have difficulty reaching the provider, please page the [facility] (Director on Call) for the Hospitalists listed on amion for assistance.  03/13/2020, 12:46 AM

## 2020-03-14 DIAGNOSIS — I10 Essential (primary) hypertension: Secondary | ICD-10-CM | POA: Diagnosis not present

## 2020-03-14 DIAGNOSIS — U071 COVID-19: Secondary | ICD-10-CM | POA: Diagnosis not present

## 2020-03-14 DIAGNOSIS — J1282 Pneumonia due to coronavirus disease 2019: Secondary | ICD-10-CM | POA: Diagnosis not present

## 2020-03-14 DIAGNOSIS — I2510 Atherosclerotic heart disease of native coronary artery without angina pectoris: Secondary | ICD-10-CM | POA: Diagnosis not present

## 2020-03-14 LAB — COMPREHENSIVE METABOLIC PANEL
ALT: 21 U/L (ref 0–44)
AST: 43 U/L — ABNORMAL HIGH (ref 15–41)
Albumin: 2.8 g/dL — ABNORMAL LOW (ref 3.5–5.0)
Alkaline Phosphatase: 63 U/L (ref 38–126)
Anion gap: 13 (ref 5–15)
BUN: 19 mg/dL (ref 8–23)
CO2: 29 mmol/L (ref 22–32)
Calcium: 8.9 mg/dL (ref 8.9–10.3)
Chloride: 105 mmol/L (ref 98–111)
Creatinine, Ser: 0.84 mg/dL (ref 0.44–1.00)
GFR, Estimated: >60 mL/min (ref >60)
Glucose, Bld: 131 mg/dL — ABNORMAL HIGH (ref 70–99)
Potassium: 3.6 mmol/L (ref 3.5–5.1)
Sodium: 147 mmol/L — ABNORMAL HIGH (ref 135–145)
Total Bilirubin: 0.7 mg/dL (ref 0.3–1.2)
Total Protein: 6.7 g/dL (ref 6.5–8.1)

## 2020-03-14 LAB — CBC WITH DIFFERENTIAL/PLATELET
Abs Immature Granulocytes: 0.14 10*3/uL — ABNORMAL HIGH (ref 0.00–0.07)
Basophils Absolute: 0 10*3/uL (ref 0.0–0.1)
Basophils Relative: 0 %
Eosinophils Absolute: 0 10*3/uL (ref 0.0–0.5)
Eosinophils Relative: 0 %
HCT: 36 % (ref 36.0–46.0)
Hemoglobin: 11.4 g/dL — ABNORMAL LOW (ref 12.0–15.0)
Immature Granulocytes: 1 %
Lymphocytes Relative: 16 %
Lymphs Abs: 1.7 10*3/uL (ref 0.7–4.0)
MCH: 27.9 pg (ref 26.0–34.0)
MCHC: 31.7 g/dL (ref 30.0–36.0)
MCV: 88.2 fL (ref 80.0–100.0)
Monocytes Absolute: 0.6 10*3/uL (ref 0.1–1.0)
Monocytes Relative: 6 %
Neutro Abs: 8.2 10*3/uL — ABNORMAL HIGH (ref 1.7–7.7)
Neutrophils Relative %: 77 %
Platelets: 350 10*3/uL (ref 150–400)
RBC: 4.08 MIL/uL (ref 3.87–5.11)
RDW: 15.1 % (ref 11.5–15.5)
WBC: 10.7 10*3/uL — ABNORMAL HIGH (ref 4.0–10.5)
nRBC: 0.4 % — ABNORMAL HIGH (ref 0.0–0.2)

## 2020-03-14 LAB — FERRITIN: Ferritin: 385 ng/mL — ABNORMAL HIGH (ref 11–307)

## 2020-03-14 LAB — C-REACTIVE PROTEIN
CRP: 9.5 mg/dL — ABNORMAL HIGH (ref <1.0)
CRP: 4.2 mg/dL — ABNORMAL HIGH (ref <1.0)

## 2020-03-14 LAB — MAGNESIUM: Magnesium: 2.4 mg/dL (ref 1.7–2.4)

## 2020-03-14 LAB — D-DIMER, QUANTITATIVE: D-Dimer, Quant: >20 ug/mL-FEU — ABNORMAL HIGH (ref 0.00–0.50)

## 2020-03-14 MED ORDER — SALINE SPRAY 0.65 % NA SOLN
1.0000 | NASAL | Status: DC | PRN
  Administered 2020-03-14: 1 via NASAL
  Filled 2020-03-14: qty 44

## 2020-03-14 MED ORDER — POLYETHYLENE GLYCOL 3350 17 G PO PACK
17.0000 g | PACK | Freq: Every day | ORAL | Status: DC
  Administered 2020-03-14 – 2020-03-16 (×3): 17 g via ORAL
  Filled 2020-03-14 (×5): qty 1

## 2020-03-14 NOTE — Plan of Care (Signed)
  Problem: Coping: Goal: Psychosocial and spiritual needs will be supported Outcome: Progressing   Problem: Respiratory: Goal: Will maintain a patent airway Outcome: Progressing Goal: Complications related to the disease process, condition or treatment will be avoided or minimized Outcome: Progressing   Problem: Health Behavior/Discharge Planning: Goal: Ability to manage health-related needs will improve Outcome: Progressing   Problem: Clinical Measurements: Goal: Ability to maintain clinical measurements within normal limits will improve Outcome: Progressing Goal: Will remain free from infection Outcome: Progressing Goal: Diagnostic test results will improve Outcome: Progressing Goal: Respiratory complications will improve Outcome: Progressing   Problem: Activity: Goal: Risk for activity intolerance will decrease Outcome: Progressing   Problem: Nutrition: Goal: Adequate nutrition will be maintained Outcome: Progressing   Problem: Coping: Goal: Level of anxiety will decrease Outcome: Progressing   Problem: Elimination: Goal: Will not experience complications related to bowel motility Outcome: Progressing Goal: Will not experience complications related to urinary retention Outcome: Progressing   Problem: Pain Managment: Goal: General experience of comfort will improve Outcome: Progressing   Problem: Safety: Goal: Ability to remain free from injury will improve Outcome: Progressing

## 2020-03-14 NOTE — Plan of Care (Signed)
Patient weaned down to 7 liters HFNC on 7 a to 7 p shift, maintains sats in the low 90's.  Patient up to chair with one assist.  She is actively utilizing flutter valve and incentive spirometer.

## 2020-03-14 NOTE — Progress Notes (Signed)
PROGRESS NOTE  Stephanie Parks  [id number] DOB: [birth year] DOA: 03/12/2020 PCP: [name], MD   Brief Narrative: Stephanie Parks is a 75 y.o. female with a history of HTN, HLD, hypokalemia, and triple-vaccinated against covid who presented to the ED 1/23 for some confusion for a few days, shortness of breath for a week, also reporting floaters intermittently for 2 weeks. She underwent CTA chest on 1/21 as an outpatient per her PCP after d-dimer resulted positive as part of work up for shortness of breath/chest discomfort. This was suggestive of multifocal pneumonia. PCP was unable to reach patient with these results, despite many attempts. In triage the patient was hypoxic requiring 5L HFNC, SARS-CoV-2 PCR positive, CRP 9.2, PCT and WBC wnl, BNP 34, troponin 18. Remdesivir, steroids were given and admission requested. Due to progressive hypoxia to requiring 15L, baricitinib was added. CTA revealed acute pulmonary embolism for which lovenox was added.  Assessment & Plan: Principal Problem:   Pneumonia due to COVID-19 virus Active Problems:   HTN (hypertension)   CAD (coronary artery disease)  Acute hypoxemic respiratory failure due to covid-19 pneumonia and acute pulmonary embolism: In pt vaccinated x3, +on 1/23 with typical CTA chest findings dating to 1/21 and development of RUL PE on CTA 1/24. - Wean oxygen as tolerated. Severity of infiltrates suggest difficult/protracted recovery. Currently down to 11L HFNC. - Added lovenox 1mg /kg q12h. - Remdesivir, solumedrol, baricitinib all indicated and being given.  - Does not appear volume overloaded currently.   AST elevation: Mild, likely due to covid.  - Monitor, not a contraindication to therapies currently.  Hypokalemia:  - Supplemented - Hold thiazide for now  CAD: Has been evaluated by Dr. Irish Lack. Troponin very mildly elevated with flat trend and no current chest pain or ischemic ECG changes (suggestive of LVH  however) - No inpatient evaluation planned.   Allergic rhinitis:  - Restart home flonase  Obesity: Estimated body mass index is 32.65 kg/m as calculated from the following:   Height as of this encounter: 5\' 5"  (1.651 m).   Weight as of this encounter: 89 kg.  DVT prophylaxis: Lovenox 1mg /kg q12h Code Status: Full Family Communication: Declines offer to call Disposition Plan:  Status is: Inpatient  Remains inpatient appropriate because:Inpatient level of care appropriate due to severity of illness   Dispo: The patient is from: Home              Anticipated d/c is to: Home              Anticipated d/c date is: > 3 days              Patient currently is not medically stable to d/c.   Consultants:   None  Procedures:   None  Antimicrobials:  Remdesivir   Subjective: Feels her breathing   Objective: Vitals:   03/14/20 0810 03/14/20 1255 03/14/20 1300 03/14/20 1420  BP:  139/82    Pulse:  84    Resp:  18    Temp:  (!) 97.2 F (36.2 C)    TempSrc:  Oral    SpO2: 96% 90% 99% 95%  Weight:      Height:        Intake/Output Summary (Last 24 hours) at 03/14/2020 1520 Last data filed at 03/14/2020 0456 Gross per 24 hour  Intake --  Output 600 ml  Net -600 ml   Filed Weights   03/12/20 1414 03/12/20 2348  Weight: 91.6 kg 89 kg  Gen: 75 y.o. female in no distress Pulm: Non-labored breathing 11L HFNC with bilateral crackles, no wheezes. CV: Regular rate and rhythm. No murmur, rub, or gallop. No JVD, no pedal edema. GI: Abdomen soft, non-tender, non-distended, with normoactive bowel sounds. No organomegaly or masses felt. Ext: Warm, no deformities Skin: No rashes, lesions or ulcers Neuro: Alert and oriented. No focal neurological deficits. Psych: Judgement and insight appear normal. Mood & affect appropriate.   Data Reviewed: I have personally reviewed following labs and imaging studies  CBC: Recent Labs  Lab 03/08/20 1631 03/12/20 1502 03/13/20 0410  03/14/20 0437  WBC 3.6* 6.7 5.5 10.7*  NEUTROABS  --  5.4 4.1 8.2*  HGB 11.7* 12.5 11.0* 11.4*  HCT 35.3* 39.1 35.2* 36.0  MCV 84.9 87.3 88.2 88.2  PLT 236.0 354 324 [id number]   Basic Metabolic Panel: Recent Labs  Lab 03/08/20 1631 03/12/20 1502 03/12/20 1503 03/13/20 0410 03/14/20 0437  NA 137 143  --  145 147*  K 2.7* 2.7*  --  3.1* 3.6  CL 95* 98  --  103 105  CO2 34* 32  --  31 29  GLUCOSE 99 125*  --  155* 131*  BUN 12 15  --  14 19  CREATININE 1.10 0.98  --  0.76 0.84  CALCIUM 8.5 8.4*  --  8.1* 8.9  MG  --   --  2.0 2.1 2.4  PHOS  --   --   --  2.5  --    GFR: Estimated Creatinine Clearance: 64.7 mL/min (by C-G formula based on SCr of 0.84 mg/dL). Liver Function Tests: Recent Labs  Lab 03/12/20 1502 03/13/20 0410 03/14/20 0437  AST 58* 49* 43*  ALT 26 24 21   ALKPHOS 58 55 63  BILITOT 0.7 0.4 0.7  PROT 7.4 6.6 6.7  ALBUMIN 3.2* 2.8* 2.8*   No results for input(s): LIPASE, AMYLASE in the last 168 hours. No results for input(s): AMMONIA in the last 168 hours. Coagulation Profile: No results for input(s): INR, PROTIME in the last 168 hours. Cardiac Enzymes: Recent Labs  Lab 03/08/20 1630  TROPONINI 7   BNP (last 3 results) Recent Labs    07/30/19 1614  PROBNP 99   HbA1C: No results for input(s): HGBA1C in the last 72 hours. CBG: No results for input(s): GLUCAP in the last 168 hours. Lipid Profile: Recent Labs    03/12/20 1503  TRIG 98   Thyroid Function Tests: No results for input(s): TSH, T4TOTAL, FREET4, T3FREE, THYROIDAB in the last 72 hours. Anemia Panel: Recent Labs    03/12/20 1503 03/13/20 0410  FERRITIN 385* 268   Urine analysis:    Component Value Date/Time   COLORURINE YELLOW 10/31/2019 [place] 10/31/2019 1321   LABSPEC 1.015 10/31/2019 1321   PHURINE 8.5 (H) 10/31/2019 1321   GLUCOSEU NEGATIVE 10/31/2019 1321   HGBUR NEGATIVE 10/31/2019 1321   BILIRUBINUR NEGATIVE 10/31/2019 1321   BILIRUBINUR negative  11/20/2016 1654   KETONESUR NEGATIVE 10/31/2019 1321   PROTEINUR NEGATIVE 10/31/2019 1321   UROBILINOGEN 0.2 11/20/2016 1654   UROBILINOGEN 1.0 02/21/2010 1008   NITRITE NEGATIVE 10/31/2019 1321   LEUKOCYTESUR NEGATIVE 10/31/2019 1321   Recent Results (from the past 240 hour(s))  Blood culture (routine x 2)     Status: None (Preliminary result)   Collection Time: 03/12/20  3:02 PM   Specimen: BLOOD  Result Value Ref Range Status   Specimen Description   Final    BLOOD LEFT ANTECUBITAL  Performed at [facility], [address] [place], [place] [address]    Special Requests   Final    BOTTLES DRAWN AEROBIC AND ANAEROBIC Blood Culture adequate volume Performed at [facility] [place], [address] [place], [place] [address]    Culture   Final    NO GROWTH < 24 HOURS Performed at [facility], [address] [place], [place] [address]    Report Status PENDING  Incomplete  Blood culture (routine x 2)     Status: None (Preliminary result)   Collection Time: 03/12/20  3:02 PM   Specimen: BLOOD  Result Value Ref Range Status   Specimen Description   Final    BLOOD RIGHT ANTECUBITAL Performed at [facility], [address] [place], [place] [address]    Special Requests   Final    BOTTLES DRAWN AEROBIC ONLY Blood Culture results may not be optimal due to an inadequate volume of blood received in culture bottles Performed at [facility] [place], [address] [place], [place] [address]    Culture   Final    NO GROWTH < 24 HOURS Performed at [facility], [address] [place], [place] [address]    Report Status PENDING  Incomplete  SARS Coronavirus 2 by RT PCR (hospital order, performed in [facility] hospital lab) Nasopharyngeal Nasopharyngeal Swab     Status: Abnormal   Collection Time: 03/12/20  3:03 PM   Specimen: Nasopharyngeal Swab  Result Value Ref Range Status   SARS Coronavirus 2 POSITIVE (A) NEGATIVE Final    Comment: RESULT  CALLED TO, READ BACK BY AND VERIFIED WITHHeywood Bene: GOUGE S RN [id number] [id number] PHILLIPS C (NOTE) SARS-CoV-2 target nucleic acids are DETECTED  SARS-CoV-2 RNA is generally detectable in upper respiratory specimens  during the acute phase of infection.  Positive results are indicative  of the presence of the identified virus, but do not rule out bacterial infection or co-infection with other pathogens not detected by the test.  Clinical correlation with patient history and  other diagnostic information is necessary to determine patient infection status.  The expected result is negative.  Fact Sheet for Patients:   BoilerBrush.com.cyhttps://www.fda.gov/media/136312/download   Fact Sheet for Healthcare Providers:   https://pope.com/https://www.fda.gov/media/136313/download    This test is not yet approved or cleared by the [place] FDA and  has been authorized for detection and/or diagnosis of SARS-CoV-2 by FDA under an Emergency Use Authorization (EUA).  This EUA will remain in effect (meaning this tes t can be used) for the duration of  the COVID-19 declaration under Section 564(b)(1) of the Act, 21 U.S.C. section 360-bbb-3(b)(1), unless the authorization is terminated or revoked sooner.  Performed at [facility] [place], [address] [place], [place] [address]       Radiology Studies: CT ANGIO CHEST PE W OR WO CONTRAST  Addendum Date: 03/13/2020   ADDENDUM REPORT: 03/13/2020 19:29 ADDENDUM: Critical Value/emergent results were called by telephone at the time of interpretation on 03/13/2020 at 7:29 pm to nurse practitioner Omelia BlackwaterBelinda Kyere , who verbally acknowledged these results. Electronically Signed   By: Deatra RobinsonKevin  Herman M.D.   On: 03/13/2020 19:29   Result Date: 03/13/2020 CLINICAL DATA:  Shortness of breath, COVID-19, elevated D-dimer EXAM: CT ANGIOGRAPHY CHEST WITH CONTRAST TECHNIQUE: Multidetector CT imaging of the chest was performed using the standard protocol during bolus administration of intravenous contrast.  Multiplanar CT image reconstructions and MIPs were obtained  to evaluate the vascular anatomy. CONTRAST:  179mL OMNIPAQUE IOHEXOL 350 MG/ML SOLN COMPARISON:  CTA chest 03/09/2020 FINDINGS: Cardiovascular: Contrast injection is sufficient to demonstrate satisfactory opacification of the pulmonary arteries to the segmental level. There is an acute pulmonary embolus within the proximal right upper lobar artery. There is no CT evidence of right heart strain. The size of the main pulmonary artery is normal. Heart size is normal, with no pericardial effusion. The course and caliber of the aorta are normal. There is atherosclerotic calcification. No acute aortic syndrome. Mediastinum/Nodes: Small hiatal hernia. Calcified subcarinal lymph nodes. Normal thyroid. Lungs/Pleura: Multifocal, peripheral predominant ground glass opacities. No pleural effusion. Upper Abdomen: Contrast bolus timing is not optimized for evaluation of the abdominal organs. The visualized portions of the organs of the upper abdomen are normal. Musculoskeletal: No chest wall abnormality. No bony spinal canal stenosis. Review of the MIP images confirms the above findings. IMPRESSION: 1. Acute pulmonary embolus within the proximal right upper lobar artery. No CT evidence of right heart strain. 2. COVID-19 pattern pneumonia. Aortic Atherosclerosis (ICD10-I70.0). Electronically Signed: By: Ulyses Jarred M.D. On: 03/13/2020 19:10   DG Chest Portable 1 View  Result Date: 03/12/2020 CLINICAL DATA:  Shortness of breath. EXAM: PORTABLE CHEST 1 VIEW COMPARISON:  December 06, 2019 FINDINGS: The mediastinal contour and cardiac silhouette are normal. Diffuse increased pulmonary interstitium is identified bilaterally. There is no focal pneumonia or pleural effusion. The bony structures are normal. IMPRESSION: Mild interstitial edema. Electronically Signed   By: Abelardo Diesel M.D.   On: 03/12/2020 15:49   VAS [place] LOWER EXTREMITY VENOUS (DVT)  Result Date:  03/13/2020  Lower Venous DVT Study Indications: Elevated Ddimer.  Risk Factors: COVID 19 positive. Limitations: Poor ultrasound/tissue interface. Comparison Study: No prior studies. Performing Technologist: Oliver Hum RVT  Examination Guidelines: A complete evaluation includes B-mode imaging, spectral Doppler, color Doppler, and power Doppler as needed of all accessible portions of each vessel. Bilateral testing is considered an integral part of a complete examination. Limited examinations for reoccurring indications may be performed as noted. The reflux portion of the exam is performed with the patient in reverse Trendelenburg.  +---------+---------------+---------+-----------+----------+--------------+ RIGHT    CompressibilityPhasicitySpontaneityPropertiesThrombus Aging +---------+---------------+---------+-----------+----------+--------------+ CFV      Full           Yes      Yes                                 +---------+---------------+---------+-----------+----------+--------------+ SFJ      Full                                                        +---------+---------------+---------+-----------+----------+--------------+ FV Prox  Full                                                        +---------+---------------+---------+-----------+----------+--------------+ FV Mid   Full                                                        +---------+---------------+---------+-----------+----------+--------------+  FV DistalFull                                                        +---------+---------------+---------+-----------+----------+--------------+ PFV      Full                                                        +---------+---------------+---------+-----------+----------+--------------+ POP      Full           Yes      Yes                                 +---------+---------------+---------+-----------+----------+--------------+ PTV       Full                                                        +---------+---------------+---------+-----------+----------+--------------+ PERO     Full                                                        +---------+---------------+---------+-----------+----------+--------------+   +---------+---------------+---------+-----------+----------+--------------+ LEFT     CompressibilityPhasicitySpontaneityPropertiesThrombus Aging +---------+---------------+---------+-----------+----------+--------------+ CFV      Full           Yes      Yes                                 +---------+---------------+---------+-----------+----------+--------------+ SFJ      Full                                                        +---------+---------------+---------+-----------+----------+--------------+ FV Prox  Full                                                        +---------+---------------+---------+-----------+----------+--------------+ FV Mid   Full                                                        +---------+---------------+---------+-----------+----------+--------------+ FV DistalFull                                                        +---------+---------------+---------+-----------+----------+--------------+  PFV      Full                                                        +---------+---------------+---------+-----------+----------+--------------+ POP      Full           Yes      Yes                                 +---------+---------------+---------+-----------+----------+--------------+ PTV      Full                                                        +---------+---------------+---------+-----------+----------+--------------+ PERO     Full                                                        +---------+---------------+---------+-----------+----------+--------------+     Summary: RIGHT: - There is no evidence of deep vein  thrombosis in the lower extremity.  - No cystic structure found in the popliteal fossa.  LEFT: - There is no evidence of deep vein thrombosis in the lower extremity.  - No cystic structure found in the popliteal fossa.  *See table(s) above for measurements and observations. Electronically signed by Ruta Hinds MD on 03/13/2020 at 6:20:06 PM.    Final     Scheduled Meds: . vitamin C  500 mg Oral Daily  . aspirin  81 mg Oral Daily  . baricitinib  4 mg Oral Daily  . enoxaparin (LOVENOX) injection  90 mg Subcutaneous Q12H  . Ipratropium-Albuterol  1 puff Inhalation QID  . losartan  50 mg Oral Daily  . methylPREDNISolone (SOLU-MEDROL) injection  60 mg Intravenous Q12H  . metoprolol succinate  12.5 mg Oral Daily  . multivitamin with minerals  1 tablet Oral Daily  . nortriptyline  25 mg Oral QHS  . pantoprazole  40 mg Oral BID  . polyethylene glycol  17 g Oral Daily  . potassium chloride SA  20 mEq Oral BID  . rosuvastatin  10 mg Oral Daily  . zinc sulfate  220 mg Oral Daily   Continuous Infusions: . sodium chloride    . remdesivir 100 mg in NS 100 mL 100 mg (03/14/20 1022)     LOS: 2 days   Time spent: 35 minutes.  Patrecia Pour, MD [facility] [email or web address] 03/14/2020, 3:20 PM

## 2020-03-15 ENCOUNTER — Inpatient Hospital Stay (HOSPITAL_COMMUNITY): Payer: Medicare Other

## 2020-03-15 DIAGNOSIS — U071 COVID-19: Secondary | ICD-10-CM | POA: Diagnosis not present

## 2020-03-15 DIAGNOSIS — J1282 Pneumonia due to coronavirus disease 2019: Secondary | ICD-10-CM | POA: Diagnosis not present

## 2020-03-15 DIAGNOSIS — I2602 Saddle embolus of pulmonary artery with acute cor pulmonale: Secondary | ICD-10-CM | POA: Diagnosis not present

## 2020-03-15 LAB — ECHOCARDIOGRAM COMPLETE
Area-P 1/2: 5.38 cm2
Calc EF: 61.1 %
Height: 65 in
S' Lateral: 2.5 cm
Single Plane A2C EF: 67.6 %
Single Plane A4C EF: 57.6 %
Weight: 3139.35 oz

## 2020-03-15 LAB — CBC WITH DIFFERENTIAL/PLATELET
Abs Immature Granulocytes: 0.18 10*3/uL — ABNORMAL HIGH (ref 0.00–0.07)
Basophils Absolute: 0 10*3/uL (ref 0.0–0.1)
Basophils Relative: 0 %
Eosinophils Absolute: 0 10*3/uL (ref 0.0–0.5)
Eosinophils Relative: 0 %
HCT: 37.8 % (ref 36.0–46.0)
Hemoglobin: 12.1 g/dL (ref 12.0–15.0)
Immature Granulocytes: 2 %
Lymphocytes Relative: 15 %
Lymphs Abs: 1.6 10*3/uL (ref 0.7–4.0)
MCH: 27.8 pg (ref 26.0–34.0)
MCHC: 32 g/dL (ref 30.0–36.0)
MCV: 86.9 fL (ref 80.0–100.0)
Monocytes Absolute: 0.5 10*3/uL (ref 0.1–1.0)
Monocytes Relative: 5 %
Neutro Abs: 8.8 10*3/uL — ABNORMAL HIGH (ref 1.7–7.7)
Neutrophils Relative %: 78 %
Platelets: 348 10*3/uL (ref 150–400)
RBC: 4.35 MIL/uL (ref 3.87–5.11)
RDW: 14.8 % (ref 11.5–15.5)
WBC: 11.1 10*3/uL — ABNORMAL HIGH (ref 4.0–10.5)
nRBC: 0.4 % — ABNORMAL HIGH (ref 0.0–0.2)

## 2020-03-15 LAB — COMPREHENSIVE METABOLIC PANEL
ALT: 22 U/L (ref 0–44)
AST: 36 U/L (ref 15–41)
Albumin: 2.7 g/dL — ABNORMAL LOW (ref 3.5–5.0)
Alkaline Phosphatase: 65 U/L (ref 38–126)
Anion gap: 11 (ref 5–15)
BUN: 24 mg/dL — ABNORMAL HIGH (ref 8–23)
CO2: 29 mmol/L (ref 22–32)
Calcium: 8.7 mg/dL — ABNORMAL LOW (ref 8.9–10.3)
Chloride: 104 mmol/L (ref 98–111)
Creatinine, Ser: 0.88 mg/dL (ref 0.44–1.00)
GFR, Estimated: >60 mL/min (ref >60)
Glucose, Bld: 122 mg/dL — ABNORMAL HIGH (ref 70–99)
Potassium: 3.8 mmol/L (ref 3.5–5.1)
Sodium: 144 mmol/L (ref 135–145)
Total Bilirubin: 0.5 mg/dL (ref 0.3–1.2)
Total Protein: 6.3 g/dL — ABNORMAL LOW (ref 6.5–8.1)

## 2020-03-15 LAB — MAGNESIUM: Magnesium: 2.5 mg/dL — ABNORMAL HIGH (ref 1.7–2.4)

## 2020-03-15 LAB — C-REACTIVE PROTEIN: CRP: 1.6 mg/dL — ABNORMAL HIGH (ref <1.0)

## 2020-03-15 MED ORDER — PSEUDOEPHEDRINE HCL ER 120 MG PO TB12
120.0000 mg | ORAL_TABLET | Freq: Two times a day (BID) | ORAL | Status: DC
  Administered 2020-03-15 – 2020-03-21 (×13): 120 mg via ORAL
  Filled 2020-03-15 (×13): qty 1

## 2020-03-15 NOTE — Progress Notes (Signed)
  Echocardiogram 2D Echocardiogram has been performed.  Stephanie Parks 03/15/2020, 3:15 PM

## 2020-03-15 NOTE — Plan of Care (Signed)
  Problem: Coping: Goal: Psychosocial and spiritual needs will be supported Outcome: Progressing   Problem: Respiratory: Goal: Will maintain a patent airway Outcome: Progressing Goal: Complications related to the disease process, condition or treatment will be avoided or minimized Outcome: Progressing   Problem: Health Behavior/Discharge Planning: Goal: Ability to manage health-related needs will improve Outcome: Progressing   Problem: Clinical Measurements: Goal: Ability to maintain clinical measurements within normal limits will improve Outcome: Progressing Goal: Will remain free from infection Outcome: Progressing Goal: Diagnostic test results will improve Outcome: Progressing Goal: Respiratory complications will improve Outcome: Progressing   Problem: Activity: Goal: Risk for activity intolerance will decrease Outcome: Progressing   Problem: Nutrition: Goal: Adequate nutrition will be maintained Outcome: Progressing   Problem: Coping: Goal: Level of anxiety will decrease Outcome: Progressing   Problem: Elimination: Goal: Will not experience complications related to bowel motility Outcome: Progressing Goal: Will not experience complications related to urinary retention Outcome: Progressing   Problem: Pain Managment: Goal: General experience of comfort will improve Outcome: Progressing   Problem: Safety: Goal: Ability to remain free from injury will improve Outcome: Progressing   

## 2020-03-15 NOTE — Care Management Important Message (Signed)
Important Message  Patient Details IM Letter placed in Patient's door Caddy. Name: Stephanie Parks MRN: [id number] Date of Birth: 08-08-1945   Medicare Important Message Given:  Yes     Kerin Salen 03/15/2020, 1:56 PM

## 2020-03-15 NOTE — Progress Notes (Addendum)
PROGRESS NOTE    Stephanie Parks  [id number] DOB: [birth year] DOA: 03/12/2020 PCP: [name], MD   Chief Complaint  Patient presents with  . Altered Mental Status  . Shortness of Breath   Brief Narrative: Stephanie Parks a74 y.o.femalewith [name] history of HTN, HLD, hypokalemia, and triple-vaccinated against covid who presented to the ED 1/23 for some confusion for Stephanie Parks few days, shortness of breath for Stephanie Parks week, also reporting floaters intermittently for 2 weeks. She underwent CTA chest on 1/21 as an outpatient per her PCP after d-dimer resulted positive as part of work up for shortness of breath/chest discomfort. This was suggestive of multifocal pneumonia. PCP was unable to reach patient with these results, despite many attempts. In triage the patient was hypoxic requiring 5L HFNC, SARS-CoV-2 PCR positive, CRP 9.2, PCT and WBC wnl, BNP 34, troponin 18. Remdesivir, steroids were given and admission requested. Due to progressive hypoxia to requiring 15L, baricitinib was added. CTA revealed acute pulmonary embolism for which lovenox was added.  Assessment & Plan:   Principal Problem:   Pneumonia due to COVID-19 virus Active Problems:   HTN (hypertension)   CAD (coronary artery disease)  Acute hypoxemic respiratory failure due to covid-19 pneumonia and acute pulmonary embolism:In pt vaccinated x3, +on 1/23 with typical CTA chest findings dating to 1/21 and development of RUL PE on CTA 1/24. - requiring 7 L HFNC - CT PE protocol with acute PE within proximal R upper lobar artery, COVID 19 pattern pneumonia - follow echocardiogram  - Added lovenox 1mg /kg q12h. - Remdesivir (1/23-present), solumedrol (1/23-present), baricitinib (1/23 - present) all indicated and being given.  - strict I/O, daily weights - prone as able, OOB, IS, flutter, therapy  COVID-19 Labs  Recent Labs    03/12/20 1502 03/12/20 1503 03/12/20 1503 03/13/20 0410 03/14/20 0437 03/15/20 0447  DDIMER  1.58*  --   --  4.50* >20.00*  --   FERRITIN  --  385*  --  268  --   --   LDH  --  607*  --   --   --   --   CRP  --  9.5*   < > 9.2* 4.2* 1.6*   < > = values in this interval not displayed.    Lab Results  Component Value Date   SARSCOV2NAA POSITIVE (Galan Ghee) 03/12/2020   [place] NEGATIVE 02/22/2019   AST elevation: Mild, likely due to covid.  - resolved  Hypokalemia:  - Supplemented - Hold thiazide for now  HTN - continue metoprolol, losartan - currently holding thiazide  HLD - continue statin  CAD: Has been evaluated by Dr. Irish Lack as outpt. Troponin very mildly elevated with flat trend and no current chest pain or ischemic ECG changes (suggestive of LVH however) - No inpatient evaluation planned.   Allergic rhinitis:  - Restart home flonase  Obesity: Estimated body mass index is 32.65 kg/m as calculated from the following:   Height as of this encounter: 5\' 5"  (1.651 m).   Weight as of this encounter: 89 kg.  DVT prophylaxis: lovenox therapeutic  Code Status: full  Family Communication: none at bedside Disposition:   Status is: Inpatient  Remains inpatient appropriate because:Inpatient level of care appropriate due to severity of illness   Dispo: The patient is from: Home              Anticipated d/c is to: pending              Anticipated d/c  date is: > 3 days              Patient currently is not medically stable to d/c.   Difficult to place patient No       Consultants:   none  Procedures:  LE [place] Summary:  RIGHT:  - There is no evidence of deep vein thrombosis in the lower extremity.    - No cystic structure found in the popliteal fossa.    LEFT:  - There is no evidence of deep vein thrombosis in the lower extremity.    - No cystic structure found in the popliteal fossa.   Antimicrobials:  Anti-infectives (From admission, onward)   Start     Dose/Rate Route Frequency Ordered Stop   03/13/20 1000  remdesivir 100 mg in sodium  chloride 0.9 % 100 mL IVPB        100 mg 200 mL/hr over 30 Minutes Intravenous Daily 03/12/20 1711 03/17/20 0959   03/12/20 1715  remdesivir 100 mg in sodium chloride 0.9 % 100 mL IVPB        100 mg 200 mL/hr over 30 Minutes Intravenous Every 30 min 03/12/20 1711 03/12/20 2301         Subjective: Complains of stuffy nose (water out on oxygen) No c/o pain  Objective: Vitals:   03/14/20 1420 03/14/20 1542 03/14/20 2000 03/15/20 0604  BP:   (!) 143/88 (!) 143/69  Pulse:   86 76  Resp:   20 18  Temp:   (!) 97.5 F (36.4 C) 97.7 F (36.5 C)  TempSrc:   Axillary Oral  SpO2: 95% 93% (!) 89% (!) 89%  Weight:      Height:       No intake or output data in the 24 hours ending 03/15/20 0940 Filed Weights   03/12/20 1414 03/12/20 2348  Weight: 91.6 kg 89 kg    Examination:  General exam: Appears calm and comfortable  Respiratory system: no increased WOB, ctab on 7 L  Cardiovascular system: S1 & S2 heard, RRR Gastrointestinal system: Abdomen is nondistended, soft and nontender.  Central nervous system: Alert and oriented. No focal neurological deficits. Extremities: no LEE Skin: No rashes, lesions or ulcers Psychiatry: Judgement and insight appear normal. Mood & affect appropriate.     Data Reviewed: I have personally reviewed following labs and imaging studies  CBC: Recent Labs  Lab 03/08/20 1631 03/12/20 1502 03/13/20 0410 03/14/20 0437 03/15/20 0447  WBC 3.6* 6.7 5.5 10.7* 11.1*  NEUTROABS  --  5.4 4.1 8.2* 8.8*  HGB 11.7* 12.5 11.0* 11.4* 12.1  HCT 35.3* 39.1 35.2* 36.0 37.8  MCV 84.9 87.3 88.2 88.2 86.9  PLT 236.0 354 324 350 0000000    Basic Metabolic Panel: Recent Labs  Lab 03/08/20 1631 03/12/20 1502 03/12/20 1503 03/13/20 0410 03/14/20 0437 03/15/20 0447  NA 137 143  --  145 147* 144  K 2.7* 2.7*  --  3.1* 3.6 3.8  CL 95* 98  --  103 105 104  CO2 34* 32  --  31 29 29   GLUCOSE 99 125*  --  155* 131* 122*  BUN 12 15  --  14 19 24*  CREATININE  1.10 0.98  --  0.76 0.84 0.88  CALCIUM 8.5 8.4*  --  8.1* 8.9 8.7*  MG  --   --  2.0 2.1 2.4 2.5*  PHOS  --   --   --  2.5  --   --  GFR: Estimated Creatinine Clearance: 61.8 mL/min (by C-G formula based on SCr of 0.88 mg/dL).  Liver Function Tests: Recent Labs  Lab 03/12/20 1502 03/13/20 0410 03/14/20 0437 03/15/20 0447  AST 58* 49* 43* 36  ALT 26 24 21 22   ALKPHOS 58 55 63 65  BILITOT 0.7 0.4 0.7 0.5  PROT 7.4 6.6 6.7 6.3*  ALBUMIN 3.2* 2.8* 2.8* 2.7*    CBG: No results for input(s): GLUCAP in the last 168 hours.   Recent Results (from the past 240 hour(s))  Blood culture (routine x 2)     Status: None (Preliminary result)   Collection Time: 03/12/20  3:02 PM   Specimen: BLOOD  Result Value Ref Range Status   Specimen Description   Final    BLOOD LEFT ANTECUBITAL Performed at [facility], [address] [place], [place] [address]    Special Requests   Final    BOTTLES DRAWN AEROBIC AND ANAEROBIC Blood Culture adequate volume Performed at [facility], [address] [place], [place] [address]    Culture   Final    NO GROWTH 2 DAYS Performed at [facility], [place] [address], [place], [place] [address]    Report Status PENDING  Incomplete  Blood culture (routine x 2)     Status: None (Preliminary result)   Collection Time: 03/12/20  3:02 PM   Specimen: BLOOD  Result Value Ref Range Status   Specimen Description   Final    BLOOD RIGHT ANTECUBITAL Performed at [facility], [place] [address] [place], [place] [address]    Special Requests   Final    BOTTLES DRAWN AEROBIC ONLY Blood Culture results may not be optimal due to an inadequate volume of blood received in culture bottles Performed at [facility], [address] [place], [place] [address]    Culture   Final    NO GROWTH 2 DAYS Performed at [facility], [place] [address], [place], [place] [address]    Report Status PENDING  Incomplete  SARS Coronavirus  2 by RT PCR (hospital order, performed in [facility] hospital lab) Nasopharyngeal Nasopharyngeal Swab     Status: Abnormal   Collection Time: 03/12/20  3:03 PM   Specimen: Nasopharyngeal Swab  Result Value Ref Range Status   SARS Coronavirus 2 POSITIVE (Shera Laubach) NEGATIVE Final    Comment: RESULT CALLED TO, READ BACK BY AND VERIFIED WITH: [place] [id number] PHILLIPS C (NOTE) SARS-CoV-2 target nucleic acids are DETECTED  SARS-CoV-2 RNA is generally detectable in upper respiratory specimens  during the acute phase of infection.  Positive results are indicative  of the presence of the identified virus, but do not rule out bacterial infection or co-infection with other pathogens not detected by the test.  Clinical correlation with patient history and  other diagnostic information is necessary to determine patient infection status.  The expected result is negative.  Fact Sheet for Patients:   StrictlyIdeas.no   Fact Sheet for Healthcare Providers:   BankingDealers.co.za    This test is not yet approved or cleared by the [place] FDA and  has been authorized for detection and/or diagnosis of SARS-CoV-2 by FDA under an Emergency Use Authorization (EUA).  This EUA will remain in effect (meaning this tes t can be used) for the duration of  the COVID-19 declaration under Section 564(b)(1) of the Act, 21 U.S.C. section 360-bbb-3(b)(1), unless the authorization is terminated or revoked  sooner.  Performed at [facility], [address] [place], [place] [address]          Radiology Studies: CT ANGIO CHEST PE W OR WO CONTRAST  Addendum Date: 03/13/2020   ADDENDUM REPORT: 03/13/2020 19:29 ADDENDUM: Critical Value/emergent results were called by telephone at the time of interpretation on 03/13/2020 at 7:29 pm to nurse practitioner Hollace Hayward , who verbally acknowledged these results. Electronically Signed   By: Ulyses Jarred  M.D.   On: 03/13/2020 19:29   Result Date: 03/13/2020 CLINICAL DATA:  Shortness of breath, COVID-19, elevated D-dimer EXAM: CT ANGIOGRAPHY CHEST WITH CONTRAST TECHNIQUE: Multidetector CT imaging of the chest was performed using the standard protocol during bolus administration of intravenous contrast. Multiplanar CT image reconstructions and MIPs were obtained to evaluate the vascular anatomy. CONTRAST:  129mL OMNIPAQUE IOHEXOL 350 MG/ML SOLN COMPARISON:  CTA chest 03/09/2020 FINDINGS: Cardiovascular: Contrast injection is sufficient to demonstrate satisfactory opacification of the pulmonary arteries to the segmental level. There is an acute pulmonary embolus within the proximal right upper lobar artery. There is no CT evidence of right heart strain. The size of the main pulmonary artery is normal. Heart size is normal, with no pericardial effusion. The course and caliber of the aorta are normal. There is atherosclerotic calcification. No acute aortic syndrome. Mediastinum/Nodes: Small hiatal hernia. Calcified subcarinal lymph nodes. Normal thyroid. Lungs/Pleura: Multifocal, peripheral predominant ground glass opacities. No pleural effusion. Upper Abdomen: Contrast bolus timing is not optimized for evaluation of the abdominal organs. The visualized portions of the organs of the upper abdomen are normal. Musculoskeletal: No chest wall abnormality. No bony spinal canal stenosis. Review of the MIP images confirms the above findings. IMPRESSION: 1. Acute pulmonary embolus within the proximal right upper lobar artery. No CT evidence of right heart strain. 2. COVID-19 pattern pneumonia. Aortic Atherosclerosis (ICD10-I70.0). Electronically Signed: By: Ulyses Jarred M.D. On: 03/13/2020 19:10   VAS [place] LOWER EXTREMITY VENOUS (DVT)  Result Date: 03/13/2020  Lower Venous DVT Study Indications: Elevated Ddimer.  Risk Factors: COVID 19 positive. Limitations: Poor ultrasound/tissue interface. Comparison Study: No prior  studies. Performing Technologist: Oliver Hum RVT  Examination Guidelines: Antino Mayabb complete evaluation includes B-mode imaging, spectral Doppler, color Doppler, and power Doppler as needed of all accessible portions of each vessel. Bilateral testing is considered an integral part of Jonessa Triplett complete examination. Limited examinations for reoccurring indications may be performed as noted. The reflux portion of the exam is performed with the patient in reverse Trendelenburg.  +---------+---------------+---------+-----------+----------+--------------+ RIGHT    CompressibilityPhasicitySpontaneityPropertiesThrombus Aging +---------+---------------+---------+-----------+----------+--------------+ CFV      Full           Yes      Yes                                 +---------+---------------+---------+-----------+----------+--------------+ SFJ      Full                                                        +---------+---------------+---------+-----------+----------+--------------+ FV Prox  Full                                                        +---------+---------------+---------+-----------+----------+--------------+  FV Mid   Full                                                        +---------+---------------+---------+-----------+----------+--------------+ FV DistalFull                                                        +---------+---------------+---------+-----------+----------+--------------+ PFV      Full                                                        +---------+---------------+---------+-----------+----------+--------------+ POP      Full           Yes      Yes                                 +---------+---------------+---------+-----------+----------+--------------+ PTV      Full                                                        +---------+---------------+---------+-----------+----------+--------------+ PERO     Full                                                         +---------+---------------+---------+-----------+----------+--------------+   +---------+---------------+---------+-----------+----------+--------------+ LEFT     CompressibilityPhasicitySpontaneityPropertiesThrombus Aging +---------+---------------+---------+-----------+----------+--------------+ CFV      Full           Yes      Yes                                 +---------+---------------+---------+-----------+----------+--------------+ SFJ      Full                                                        +---------+---------------+---------+-----------+----------+--------------+ FV Prox  Full                                                        +---------+---------------+---------+-----------+----------+--------------+ FV Mid   Full                                                        +---------+---------------+---------+-----------+----------+--------------+  FV DistalFull                                                        +---------+---------------+---------+-----------+----------+--------------+ PFV      Full                                                        +---------+---------------+---------+-----------+----------+--------------+ POP      Full           Yes      Yes                                 +---------+---------------+---------+-----------+----------+--------------+ PTV      Full                                                        +---------+---------------+---------+-----------+----------+--------------+ PERO     Full                                                        +---------+---------------+---------+-----------+----------+--------------+     Summary: RIGHT: - There is no evidence of deep vein thrombosis in the lower extremity.  - No cystic structure found in the popliteal fossa.  LEFT: - There is no evidence of deep vein thrombosis in the lower extremity.  - No cystic  structure found in the popliteal fossa.  *See table(s) above for measurements and observations. Electronically signed by Ruta Hinds MD on 03/13/2020 at 6:20:06 PM.    Final         Scheduled Meds: . vitamin C  500 mg Oral Daily  . aspirin  81 mg Oral Daily  . baricitinib  4 mg Oral Daily  . enoxaparin (LOVENOX) injection  90 mg Subcutaneous Q12H  . Ipratropium-Albuterol  1 puff Inhalation QID  . losartan  50 mg Oral Daily  . methylPREDNISolone (SOLU-MEDROL) injection  60 mg Intravenous Q12H  . metoprolol succinate  12.5 mg Oral Daily  . multivitamin with minerals  1 tablet Oral Daily  . nortriptyline  25 mg Oral QHS  . pantoprazole  40 mg Oral BID  . polyethylene glycol  17 g Oral Daily  . potassium chloride SA  20 mEq Oral BID  . rosuvastatin  10 mg Oral Daily  . zinc sulfate  220 mg Oral Daily   Continuous Infusions: . sodium chloride    . remdesivir 100 mg in NS 100 mL 100 mg (03/14/20 1022)     LOS: 3 days    Time spent: over 87 min    Fayrene Helper, MD [facility]   To contact the attending provider between 7A-7P or the covering provider during after hours 7P-7A, please log into the web site [email or web address] and access using universal [place] password for that  web site. If you do not have the password, please call the hospital operator.  03/15/2020, 9:40 AM

## 2020-03-16 ENCOUNTER — Inpatient Hospital Stay (HOSPITAL_COMMUNITY): Payer: Medicare Other

## 2020-03-16 DIAGNOSIS — U071 COVID-19: Secondary | ICD-10-CM | POA: Diagnosis not present

## 2020-03-16 DIAGNOSIS — J1282 Pneumonia due to coronavirus disease 2019: Secondary | ICD-10-CM | POA: Diagnosis not present

## 2020-03-16 LAB — COMPREHENSIVE METABOLIC PANEL
ALT: 21 U/L (ref 0–44)
AST: 33 U/L (ref 15–41)
Albumin: 3 g/dL — ABNORMAL LOW (ref 3.5–5.0)
Alkaline Phosphatase: 69 U/L (ref 38–126)
Anion gap: 12 (ref 5–15)
BUN: 23 mg/dL (ref 8–23)
CO2: 28 mmol/L (ref 22–32)
Calcium: 9 mg/dL (ref 8.9–10.3)
Chloride: 102 mmol/L (ref 98–111)
Creatinine, Ser: 0.9 mg/dL (ref 0.44–1.00)
GFR, Estimated: >60 mL/min (ref >60)
Glucose, Bld: 122 mg/dL — ABNORMAL HIGH (ref 70–99)
Potassium: 4.1 mmol/L (ref 3.5–5.1)
Sodium: 142 mmol/L (ref 135–145)
Total Bilirubin: 0.6 mg/dL (ref 0.3–1.2)
Total Protein: 6.6 g/dL (ref 6.5–8.1)

## 2020-03-16 LAB — D-DIMER, QUANTITATIVE: D-Dimer, Quant: 19.44 ug/mL-FEU — ABNORMAL HIGH (ref 0.00–0.50)

## 2020-03-16 LAB — CBC WITH DIFFERENTIAL/PLATELET
Abs Immature Granulocytes: 0.47 10*3/uL — ABNORMAL HIGH (ref 0.00–0.07)
Basophils Absolute: 0 10*3/uL (ref 0.0–0.1)
Basophils Relative: 0 %
Eosinophils Absolute: 0 10*3/uL (ref 0.0–0.5)
Eosinophils Relative: 0 %
HCT: 39.4 % (ref 36.0–46.0)
Hemoglobin: 12.3 g/dL (ref 12.0–15.0)
Immature Granulocytes: 5 %
Lymphocytes Relative: 13 %
Lymphs Abs: 1.4 10*3/uL (ref 0.7–4.0)
MCH: 27.6 pg (ref 26.0–34.0)
MCHC: 31.2 g/dL (ref 30.0–36.0)
MCV: 88.5 fL (ref 80.0–100.0)
Monocytes Absolute: 0.4 10*3/uL (ref 0.1–1.0)
Monocytes Relative: 4 %
Neutro Abs: 8.1 10*3/uL — ABNORMAL HIGH (ref 1.7–7.7)
Neutrophils Relative %: 78 %
Platelets: 421 10*3/uL — ABNORMAL HIGH (ref 150–400)
RBC: 4.45 MIL/uL (ref 3.87–5.11)
RDW: 14.9 % (ref 11.5–15.5)
WBC: 10.5 10*3/uL (ref 4.0–10.5)
nRBC: 0.4 % — ABNORMAL HIGH (ref 0.0–0.2)

## 2020-03-16 LAB — C-REACTIVE PROTEIN: CRP: 1 mg/dL — ABNORMAL HIGH (ref <1.0)

## 2020-03-16 LAB — MAGNESIUM: Magnesium: 2.4 mg/dL (ref 1.7–2.4)

## 2020-03-16 LAB — FERRITIN: Ferritin: 261 ng/mL (ref 11–307)

## 2020-03-16 MED ORDER — ZINC SULFATE 220 (50 ZN) MG PO CAPS
220.0000 mg | ORAL_CAPSULE | Freq: Every day | ORAL | Status: DC
  Administered 2020-03-17 – 2020-03-20 (×4): 220 mg via ORAL
  Filled 2020-03-16 (×4): qty 1

## 2020-03-16 MED ORDER — ADULT MULTIVITAMIN W/MINERALS CH
1.0000 | ORAL_TABLET | Freq: Every day | ORAL | Status: DC
  Administered 2020-03-17 – 2020-03-20 (×4): 1 via ORAL
  Filled 2020-03-16 (×4): qty 1

## 2020-03-16 MED ORDER — ASCORBIC ACID 500 MG PO TABS
500.0000 mg | ORAL_TABLET | Freq: Every day | ORAL | Status: DC
Start: 2020-03-17 — End: 2020-03-21
  Administered 2020-03-17 – 2020-03-20 (×4): 500 mg via ORAL
  Filled 2020-03-16 (×4): qty 1

## 2020-03-16 NOTE — Plan of Care (Signed)
  Problem: Coping: Goal: Psychosocial and spiritual needs will be supported Outcome: Progressing   Problem: Respiratory: Goal: Will maintain a patent airway Outcome: Progressing Goal: Complications related to the disease process, condition or treatment will be avoided or minimized Outcome: Progressing   Problem: Health Behavior/Discharge Planning: Goal: Ability to manage health-related needs will improve Outcome: Progressing   Problem: Clinical Measurements: Goal: Ability to maintain clinical measurements within normal limits will improve Outcome: Progressing Goal: Will remain free from infection Outcome: Progressing Goal: Diagnostic test results will improve Outcome: Progressing Goal: Respiratory complications will improve Outcome: Progressing   Problem: Activity: Goal: Risk for activity intolerance will decrease Outcome: Progressing   Problem: Nutrition: Goal: Adequate nutrition will be maintained Outcome: Progressing   Problem: Coping: Goal: Level of anxiety will decrease Outcome: Progressing   Problem: Elimination: Goal: Will not experience complications related to bowel motility Outcome: Progressing Goal: Will not experience complications related to urinary retention Outcome: Progressing   Problem: Pain Managment: Goal: General experience of comfort will improve Outcome: Progressing   Problem: Safety: Goal: Ability to remain free from injury will improve Outcome: Progressing   

## 2020-03-16 NOTE — Progress Notes (Addendum)
PROGRESS NOTE    [name]  [id number] DOB: [birth year] DOA: 03/12/2020 PCP: [name], MD   Chief Complaint  Patient presents with  . Altered Mental Status  . Shortness of Breath   Brief Narrative: Stephanie Parks a75 y.o.femalewith Tequita Marrs history of HTN, HLD, hypokalemia, and triple-vaccinated against covid who presented to the ED 1/23 for some confusion for Stephanie Parks few days, shortness of breath for Stephanie Parks week, also reporting floaters intermittently for 2 weeks. She underwent CTA chest on 1/21 as an outpatient per her PCP after d-dimer resulted positive as part of work up for shortness of breath/chest discomfort. This was suggestive of multifocal pneumonia. PCP was unable to reach patient with these results, despite many attempts. In triage the patient was hypoxic requiring 5L HFNC, SARS-CoV-2 PCR positive, CRP 9.2, PCT and WBC wnl, BNP 34, troponin 18. Remdesivir, steroids were given and admission requested. Due to progressive hypoxia to requiring 15L, baricitinib was added. CTA revealed acute pulmonary embolism for which lovenox was added.  Assessment & Plan:   Principal Problem:   Pneumonia due to COVID-19 virus Active Problems:   HTN (hypertension)   CAD (coronary artery disease)  Acute hypoxemic respiratory failure due to covid-19 pneumonia and acute pulmonary embolism:In pt vaccinated x3, +on 1/23 with typical CTA chest findings dating to 1/21 and development of RUL PE on CTA 1/24. - requiring 5 L HFNC - CT PE protocol with acute PE within proximal R upper lobar artery, COVID 19 pattern pneumonia - CXR 1/27 pending - follow echocardiogram -> EF 55-60%, no RWMA, moderate LVH, diastolic dysfunction, RVSF moderately reduced - Added lovenox 1mg /kg q12h. - Remdesivir (1/23-present), solumedrol (1/23-present), baricitinib (1/23 - present) all indicated and being given.  - strict I/O, daily weights - prone as able, OOB, IS, flutter, therapy  COVID-19 Labs  Recent Labs     03/14/20 0437 03/15/20 0447 03/16/20 0423  DDIMER >20.00*  --  19.44*  FERRITIN  --   --  261  CRP 4.2* 1.6* 1.0*    Lab Results  Component Value Date   SARSCOV2NAA POSITIVE ([name]) 03/12/2020   SARSCOV2NAA NEGATIVE 02/22/2019   Moderately Reduced RVSF  Moderately Elevated PASP Likely related to covid infection and/or PE Will need repeat echocardiogram to follow up after she's improved  AST elevation: Mild, likely due to covid.  - resolved  Hypokalemia:  - Supplemented - Hold thiazide for now  HTN - continue metoprolol, losartan - currently holding thiazide  HLD - continue statin  CAD: Has been evaluated by Dr. Irish Lack as outpt. Troponin very mildly elevated with flat trend and no current chest pain or ischemic ECG changes (suggestive of LVH however) - No inpatient evaluation planned.   Allergic rhinitis:  - Restart home flonase  Obesity: Estimated body mass index is 32.65 kg/m as calculated from the following:   Height as of this encounter: 5\' 5"  (1.651 m).   Weight as of this encounter: 89 kg.  DVT prophylaxis: lovenox therapeutic  Code Status: full  Family Communication: none at bedside - son Disposition:   Status is: Inpatient  Remains inpatient appropriate because:Inpatient level of care appropriate due to severity of illness   Dispo: The patient is from: Home              Anticipated d/c is to: pending              Anticipated d/c date is: > 3 days  Patient currently is not medically stable to d/c.   Difficult to place patient No       Consultants:   none  Procedures:  LE [place] Summary:  RIGHT:  - There is no evidence of deep vein thrombosis in the lower extremity.    - No cystic structure found in the popliteal fossa.    LEFT:  - There is no evidence of deep vein thrombosis in the lower extremity.    - No cystic structure found in the popliteal fossa.   Echo IMPRESSIONS    1. Left ventricular ejection  fraction, by estimation, is 55 to 60%. The  left ventricle has normal function. The left ventricle has no regional  wall motion abnormalities. There is moderate left ventricular hypertrophy.  Left ventricular diastolic  parameters are consistent with Grade I diastolic dysfunction (impaired  relaxation).  2. Right ventricular systolic function is moderately reduced. The right  ventricular size is moderately enlarged. There is moderately elevated  pulmonary artery systolic pressure.  3. Right atrial size was mildly dilated.  4. The mitral valve is abnormal. Trivial mitral valve regurgitation. No  evidence of mitral stenosis.  5. The aortic valve is tricuspid. Aortic valve regurgitation is not  visualized. No aortic stenosis is present.  6. The inferior vena cava is normal in size with <50% respiratory  variability, suggesting right atrial pressure of 8 mmHg.  Antimicrobials:  Anti-infectives (From admission, onward)   Start     Dose/Rate Route Frequency Ordered Stop   03/13/20 1000  remdesivir 100 mg in sodium chloride 0.9 % 100 mL IVPB        100 mg 200 mL/hr over 30 Minutes Intravenous Daily 03/12/20 1711 03/17/20 0959   03/12/20 1715  remdesivir 100 mg in sodium chloride 0.9 % 100 mL IVPB        100 mg 200 mL/hr over 30 Minutes Intravenous Every 30 min 03/12/20 1711 03/12/20 2301         Subjective: Complaining of the amount of meds she's taking No new complaints otherwise  Objective: Vitals:   03/15/20 2042 03/16/20 0500 03/16/20 0613 03/16/20 0735  BP: (!) 145/78  (!) 157/80   Pulse: 79  84   Resp: 20  20   Temp: 97.7 F (36.5 C)  97.7 F (36.5 C)   TempSrc:   Oral   SpO2: 94%  (!) 86% 91%  Weight:  88.8 kg    Height:        Intake/Output Summary (Last 24 hours) at 03/16/2020 1020 Last data filed at 03/16/2020 0524 Gross per 24 hour  Intake 480 ml  Output 500 ml  Net -20 ml   Filed Weights   03/12/20 1414 03/12/20 2348 03/16/20 0500  Weight: 91.6 kg 89  kg 88.8 kg    Examination:  General: No acute distress. Cardiovascular: RRR Lungs: diminished breath sounds bilaterally  Abdomen: Soft, nontender, nondistended Neurological: Alert and oriented 3. Moves all extremities 4. Cranial nerves II through XII grossly intact. Skin: Warm and dry. No rashes or lesions. Extremities: No clubbing or cyanosis. Trace edema.   Data Reviewed: I have personally reviewed following labs and imaging studies  CBC: Recent Labs  Lab 03/12/20 1502 03/13/20 0410 03/14/20 0437 03/15/20 0447 03/16/20 0423  WBC 6.7 5.5 10.7* 11.1* 10.5  NEUTROABS 5.4 4.1 8.2* 8.8* 8.1*  HGB 12.5 11.0* 11.4* 12.1 12.3  HCT 39.1 35.2* 36.0 37.8 39.4  MCV 87.3 88.2 88.2 86.9 88.5  PLT 354 324 350 348  421*    Basic Metabolic Panel: Recent Labs  Lab 03/12/20 1502 03/12/20 1503 03/13/20 0410 03/14/20 0437 03/15/20 0447 03/16/20 0423  NA 143  --  145 147* 144 142  K 2.7*  --  3.1* 3.6 3.8 4.1  CL 98  --  103 105 104 102  CO2 32  --  31 29 29 28   GLUCOSE 125*  --  155* 131* 122* 122*  BUN 15  --  14 19 24* 23  CREATININE 0.98  --  0.76 0.84 0.88 0.90  CALCIUM 8.4*  --  8.1* 8.9 8.7* 9.0  MG  --  2.0 2.1 2.4 2.5* 2.4  PHOS  --   --  2.5  --   --   --     GFR: Estimated Creatinine Clearance: 60.3 mL/min (by C-G formula based on SCr of 0.9 mg/dL).  Liver Function Tests: Recent Labs  Lab 03/12/20 1502 03/13/20 0410 03/14/20 0437 03/15/20 0447 03/16/20 0423  AST 58* 49* 43* 36 33  ALT 26 24 21 22 21   ALKPHOS 58 55 63 65 69  BILITOT 0.7 0.4 0.7 0.5 0.6  PROT 7.4 6.6 6.7 6.3* 6.6  ALBUMIN 3.2* 2.8* 2.8* 2.7* 3.0*    CBG: No results for input(s): GLUCAP in the last 168 hours.   Recent Results (from the past 240 hour(s))  Blood culture (routine x 2)     Status: None (Preliminary result)   Collection Time: 03/12/20  3:02 PM   Specimen: BLOOD  Result Value Ref Range Status   Specimen Description   Final    BLOOD LEFT ANTECUBITAL Performed at [facility], [address], [place], [place] [address]    Special Requests   Final    BOTTLES DRAWN AEROBIC AND ANAEROBIC Blood Culture adequate volume Performed at [facility], [address] [place], [place] [address]    Culture   Final    NO GROWTH 3 DAYS Performed at [facility], [place] [address], [place], [place] [address]    Report Status PENDING  Incomplete  Blood culture (routine x 2)     Status: None (Preliminary result)   Collection Time: 03/12/20  3:02 PM   Specimen: BLOOD  Result Value Ref Range Status   Specimen Description   Final    BLOOD RIGHT ANTECUBITAL Performed at [facility], [place] [address] [place], [place] [address]    Special Requests   Final    BOTTLES DRAWN AEROBIC ONLY Blood Culture results may not be optimal due to an inadequate volume of blood received in culture bottles Performed at [facility], [address] [place], [place] [address]    Culture   Final    NO GROWTH 3 DAYS Performed at [facility], [place] [address], [place], [place] [address]    Report Status PENDING  Incomplete  SARS Coronavirus 2 by RT PCR (hospital order, performed in [facility] hospital lab) Nasopharyngeal Nasopharyngeal Swab     Status: Abnormal   Collection Time: 03/12/20  3:03 PM   Specimen: Nasopharyngeal Swab  Result Value Ref Range Status   SARS Coronavirus 2 POSITIVE (Carlei Huang) NEGATIVE Final    Comment: RESULT CALLED TO, READ BACK BY AND VERIFIED WITH: [place] [id number] PHILLIPS C (NOTE) SARS-CoV-2 target nucleic acids are DETECTED  SARS-CoV-2 RNA is generally detectable in upper respiratory specimens  during the acute phase of infection.  Positive results are  indicative  of the presence of the identified virus, but do not rule out bacterial infection or co-infection with other pathogens not detected by the test.  Clinical correlation with patient history and  other diagnostic information is necessary to determine  patient infection status.  The expected result is negative.  Fact Sheet for Patients:   StrictlyIdeas.no   Fact Sheet for Healthcare Providers:   BankingDealers.co.za    This test is not yet approved or cleared by the [place] FDA and  has been authorized for detection and/or diagnosis of SARS-CoV-2 by FDA under an Emergency Use Authorization (EUA).  This EUA will remain in effect (meaning this tes t can be used) for the duration of  the COVID-19 declaration under Section 564(b)(1) of the Act, 21 U.S.C. section 360-bbb-3(b)(1), unless the authorization is terminated or revoked sooner.  Performed at [facility], [address] [place], [place] [address]          Radiology Studies: ECHOCARDIOGRAM COMPLETE  Result Date: 03/15/2020    ECHOCARDIOGRAM REPORT   Patient Name:   Stephanie Parks Date of Exam: 03/15/2020 Medical Rec #:  [id number]        Height:       65.0 in Accession #:    [id number]       Weight:       196.2 lb Date of Birth:  1945/12/15        BSA:          1.962 m Patient Age:    50 years         BP:           140/67 mmHg Patient Gender: F                HR:           86 bpm. Exam Location:  Inpatient Procedure: 2D Echo, 3D Echo, Cardiac Doppler and Color Doppler Indications:    I26.02 Pulmonary embolus  History:        Patient has prior history of Echocardiogram examinations, most                 recent 04/16/2011. CAD, Signs/Symptoms:Dizziness/Lightheadedness                 and Chest Pain; Risk Factors:Hypertension and Dyslipidemia.                 Covid positive.  Sonographer:    Roseanna Rainbow RDCS Referring Phys: [phone] Niklas Chretien CALDWELL POWELL JR  Sonographer Comments: Technically difficult study due to poor echo windows, suboptimal apical window and suboptimal subcostal window. IMPRESSIONS  1. Left ventricular ejection fraction, by estimation, is 55 to 60%. The left ventricle has normal function. The left ventricle has  no regional wall motion abnormalities. There is moderate left ventricular hypertrophy. Left ventricular diastolic parameters are consistent with Grade I diastolic dysfunction (impaired relaxation).  2. Right ventricular systolic function is moderately reduced. The right ventricular size is moderately enlarged. There is moderately elevated pulmonary artery systolic pressure.  3. Right atrial size was mildly dilated.  4. The mitral valve is abnormal. Trivial mitral valve regurgitation. No evidence of mitral stenosis.  5. The aortic valve is tricuspid. Aortic valve regurgitation is not visualized. No aortic stenosis is present.  6. The inferior vena cava is normal in size with <50% respiratory variability, suggesting right atrial pressure of 8 mmHg. FINDINGS  Left Ventricle: Left ventricular ejection fraction, by estimation, is 55 to 60%.  The left ventricle has normal function. The left ventricle has no regional wall motion abnormalities. The left ventricular internal cavity size was normal in size. There is  moderate left ventricular hypertrophy. Left ventricular diastolic parameters are consistent with Grade I diastolic dysfunction (impaired relaxation). Right Ventricle: The right ventricular size is moderately enlarged. Right vetricular wall thickness was not assessed. Right ventricular systolic function is moderately reduced. There is moderately elevated pulmonary artery systolic pressure. The tricuspid regurgitant velocity is 2.86 m/s, and with an assumed right atrial pressure of 15 mmHg, the estimated right ventricular systolic pressure is 63.8 mmHg. Left Atrium: Left atrial size was normal in size. Right Atrium: Right atrial size was mildly dilated. Pericardium: There is no evidence of pericardial effusion. Mitral Valve: The mitral valve is abnormal. There is mild thickening of the mitral valve leaflet(s). There is mild calcification of the mitral valve leaflet(s). Mild mitral annular calcification. Trivial  mitral valve regurgitation. No evidence of mitral valve stenosis. Tricuspid Valve: The tricuspid valve is normal in structure. Tricuspid valve regurgitation is mild . No evidence of tricuspid stenosis. Aortic Valve: The aortic valve is tricuspid. Aortic valve regurgitation is not visualized. No aortic stenosis is present. Pulmonic Valve: The pulmonic valve was normal in structure. Pulmonic valve regurgitation is mild. No evidence of pulmonic stenosis. Aorta: The aortic root is normal in size and structure. Venous: The inferior vena cava is normal in size with less than 50% respiratory variability, suggesting right atrial pressure of 8 mmHg. IAS/Shunts: No atrial level shunt detected by color flow Doppler.  LEFT VENTRICLE PLAX 2D LVIDd:         3.60 cm     Diastology LVIDs:         2.50 cm     LV e' medial:    10.10 cm/s LV PW:         1.60 cm     LV E/e' medial:  7.2 LV IVS:        1.50 cm     LV e' lateral:   8.39 cm/s LVOT diam:     2.00 cm     LV E/e' lateral: 8.7 LV SV:         62 LV SV Index:   32 LVOT Area:     3.14 cm  LV Volumes (MOD) LV vol d, MOD A2C: 72.5 ml LV vol d, MOD A4C: 95.9 ml LV vol s, MOD A2C: 23.5 ml LV vol s, MOD A4C: 40.7 ml LV SV MOD A2C:     49.0 ml LV SV MOD A4C:     95.9 ml LV SV MOD BP:      51.2 ml RIGHT VENTRICLE             IVC RV S prime:     15.10 cm/s  IVC diam: 1.70 cm TAPSE (M-mode): 2.0 cm LEFT ATRIUM             Index       RIGHT ATRIUM           Index LA diam:        2.80 cm 1.43 cm/m  RA Area:     12.30 cm LA Vol (A2C):   19.0 ml 9.68 ml/m  RA Volume:   25.60 ml  13.05 ml/m LA Vol (A4C):   23.1 ml 11.77 ml/m LA Biplane Vol: 20.6 ml 10.50 ml/m  AORTIC VALVE LVOT Vmax:   106.00 cm/s LVOT Vmean:  65.000 cm/s LVOT VTI:  0.198 m  AORTA Ao Root diam: 3.40 cm Ao Asc diam:  3.65 cm MITRAL VALVE                TRICUSPID VALVE MV Area (PHT): 5.38 cm     TR Peak grad:   32.7 mmHg MV Decel Time: 141 msec     TR Vmax:        286.00 cm/s MV E velocity: 73.10 cm/s MV Kalicia Dufresne velocity:  124.00 cm/s  SHUNTS MV E/[place] Kupfer ratio:  0.59         Systemic VTI:  0.20 m                             Systemic Diam: 2.00 cm Jenkins Rouge MD Electronically signed by Jenkins Rouge MD Signature Date/Time: 03/15/2020/4:32:12 PM    Final         Scheduled Meds: . Derrill Memo ON 03/17/2020] vitamin C  500 mg Oral QHS  . aspirin  81 mg Oral Daily  . baricitinib  4 mg Oral Daily  . enoxaparin (LOVENOX) injection  90 mg Subcutaneous Q12H  . Ipratropium-Albuterol  1 puff Inhalation QID  . losartan  50 mg Oral Daily  . methylPREDNISolone (SOLU-MEDROL) injection  60 mg Intravenous Q12H  . metoprolol succinate  12.5 mg Oral Daily  . [START ON 03/17/2020] multivitamin with minerals  1 tablet Oral QHS  . nortriptyline  25 mg Oral QHS  . pantoprazole  40 mg Oral BID  . polyethylene glycol  17 g Oral Daily  . potassium chloride SA  20 mEq Oral BID  . pseudoephedrine  120 mg Oral BID  . rosuvastatin  10 mg Oral Daily  . [START ON 03/17/2020] zinc sulfate  220 mg Oral QHS   Continuous Infusions: . sodium chloride    . remdesivir 100 mg in NS 100 mL 100 mg (03/16/20 1004)     LOS: 4 days    Time spent: over 29 min    Fayrene Helper, MD [facility]   To contact the attending provider between 7A-7P or the covering provider during after hours 7P-7A, please log into the web site [email or web address] and access using universal [place] password for that web site. If you do not have the password, please call the hospital operator.  03/16/2020, 10:20 AM

## 2020-03-16 NOTE — Progress Notes (Signed)
ANTICOAGULATION CONSULT NOTE -  Consult  Pharmacy Consult for Lovenox Indication: pulmonary embolus  Allergies  Allergen Reactions  . Shellfish Allergy Hives and Swelling  . Ace Inhibitors Other (See Comments)    Pt cannot recall her reaction but tolerates arb   . Gabapentin Other (See Comments)    headaches  . Pitavastatin     Unknown reaction   . Topiramate     Heart race,   . Sulfa Antibiotics Other (See Comments) and Rash    Fine bumps    Patient Measurements: Height: 5\' 5"  (165.1 cm) Weight: 88.8 kg (195 lb 12.3 oz) (bedscale) IBW/kg (Calculated) : 57  Vital Signs: Temp: 97.4 F (36.3 C) (01/27 1254) Temp Source: Oral (01/27 1254) BP: 151/83 (01/27 1254) Pulse Rate: 84 (01/27 1254)  Labs: Recent Labs    03/14/20 0437 03/15/20 0447 03/16/20 0423  HGB 11.4* 12.1 12.3  HCT 36.0 37.8 39.4  PLT 350 348 421*  CREATININE 0.84 0.88 0.90    Estimated Creatinine Clearance: 60.3 mL/min (by C-G formula based on SCr of 0.9 mg/dL).   Medical History: Past Medical History:  Diagnosis Date  . 3-vessel CAD 03/01/2015  . Achalasia 09/28/2012  . Allergic rhinitis 03/01/2015  . Anemia   . Aneurysm ([place]) 03/01/2015  . BP (high blood pressure) 03/01/2015  . Bronchitis 04/23/2018  . Cephalalgia 08/24/2012   Overview:  ICD-10 cut over    . Chest pain 04/04/2011  . CN (constipation) 09/28/2012  . Coughing 04/24/2017  . Decreased potassium in the blood 03/01/2015  . Diverticulosis   . Dizziness 03/01/2015  . GERD (gastroesophageal reflux disease)   . Hiatal hernia   . Hypercholesterolemia 03/01/2015  . Hyperlipidemia   . Hypertension   . Ingrown toenail 08/10/2017  . Inguinal hernia   . Migraine headache   . Onychomycosis 12/08/2017  . Paresthesia of arm 03/01/2015  . Schatzki's ring   . Tendonitis, Achilles, right 12/08/2017  . Unilateral primary osteoarthritis, left knee 10/07/2017    Assessment: [name].  Today, Chest CT + PNA, no evidence of right heart  strain.   Lovenox 40mg  ordered for VTE px on admission however today's dose not given/charted as patient refused.   CBC- Hg slightly low 11, pltc WNL.  Scr at baseline, CrCl >3ml/min.    Dosage will likely remain stable at below dosage  and need for further dosage adjustment appears unlikely at present.    Will sign off at this time.  Please reconsult if a change in clinical status warrants re-evaluation of dosage.     Goal of Therapy:  Anti-Xa level 0.6-1 units/ml 4hrs after LMWH dose given Monitor platelets by anticoagulation protocol: Yes   Plan:  Lovenox 90mg  SQ q12h Monitor CBC, s/sx of bleeding   Royetta Asal, PharmD, BCPS 03/16/2020 2:24 PM

## 2020-03-16 NOTE — Evaluation (Signed)
Physical Therapy Evaluation Patient Details Name: Stephanie Parks MRN: [id number] DOB: [birth year] Today's Date: 03/16/2020   History of Present Illness  75 y.o. female with a history of HTN, HLD, hypokalemia, and triple-vaccinated against covid who presented to the ED 03/12/20 for some confusion for a few days, shortness of breath for a week, also reporting floaters intermittently for 2 weeks. CTA revealed acute pulmonary embolism for which lovenox was added.  Pt admitted for Acute hypoxemic respiratory failure due to covid-19 pneumonia and acute pulmonary embolism.  Clinical Impression  Pt admitted with above diagnosis.  Pt currently with functional limitations due to the deficits listed below (see PT Problem List). Pt will benefit from skilled PT to increase their independence and safety with mobility to allow discharge to the venue listed below.  Pt agreeable to mobilize however fatigued quickly and appears SOB.  Pt with very little verbalizations or engagement with therapist.  Pt assisted to [facility] and then to recliner.  Pt on 5L on arrival and SpO2 dropped to 78% upon sitting EOB.  Pt placed on 6L for transfer and SPO2 89% with transfers.  Pt requires increased time for mobility likely due to dyspnea.     Follow Up Recommendations Home health PT;Supervision for mobility/OOB    Equipment Recommendations  Rolling walker with 5" wheels    Recommendations for Other Services       Precautions / Restrictions Precautions Precautions: Fall Precaution Comments: currently on 5L HFNC      Mobility  Bed Mobility Overal bed mobility: Needs Assistance Bed Mobility: Supine to Sit     Supine to sit: Supervision;HOB elevated     General bed mobility comments: increased time and effort required    Transfers Overall transfer level: Needs assistance Equipment used: None Transfers: Sit to/from Stand;Stand Pivot Transfers Sit to Stand: Min guard Stand pivot transfers: Min guard        General transfer comment: min/guard for safety, pt utilized armrests for stability, transferred from [facility] and then to recliner  Ambulation/Gait                Stairs            Wheelchair Mobility    Modified Rankin (Stroke Patients Only)       Balance                                             Pertinent Vitals/Pain Pain Assessment: No/denies pain    Home Living Family/patient expects to be discharged to:: Private residence Living Arrangements: Children               Additional Comments: pt not forthcoming with information    Prior Function Level of Independence: Independent with assistive device(s)               Hand Dominance        Extremity/Trunk Assessment        Lower Extremity Assessment Lower Extremity Assessment: Generalized weakness       Communication   Communication: No difficulties  Cognition Arousal/Alertness: Awake/alert Behavior During Therapy: Flat affect Overall Cognitive Status: Difficult to assess                                 General Comments: pt generally not verbalizing, answered some yes/no questions  General Comments      Exercises     Assessment/Plan    PT Assessment Patient needs continued PT services  PT Problem List Cardiopulmonary status limiting activity;Decreased knowledge of use of DME;Decreased strength;Decreased mobility;Decreased activity tolerance;Decreased balance       PT Treatment Interventions DME instruction;Gait training;Balance training;Therapeutic exercise;Functional mobility training;Therapeutic activities;Patient/family education    PT Goals (Current goals can be found in the Care Plan section)  Acute Rehab PT Goals PT Goal Formulation: With patient Time For Goal Achievement: 03/30/20 Potential to Achieve Goals: Good    Frequency Min 3X/week   Barriers to discharge        Co-evaluation               AM-PAC PT "6  Clicks" Mobility  Outcome Measure Help needed turning from your back to your side while in a flat bed without using bedrails?: A Little Help needed moving from lying on your back to sitting on the side of a flat bed without using bedrails?: A Little Help needed moving to and from a bed to a chair (including a wheelchair)?: A Little Help needed standing up from a chair using your arms (e.g., wheelchair or bedside chair)?: A Little Help needed to walk in hospital room?: A Lot Help needed climbing 3-5 steps with a railing? : A Lot 6 Click Score: 16    End of Session Equipment Utilized During Treatment: Gait belt Activity Tolerance: Patient tolerated treatment well Patient left: in chair;with call bell/phone within reach;with chair alarm set Nurse Communication: Mobility status PT Visit Diagnosis: Other abnormalities of gait and mobility (R26.89)    Time: 3790-2409 PT Time Calculation (min) (ACUTE ONLY): 29 min   Charges:   PT Evaluation $PT Eval Low Complexity: 1 Low PT Treatments $Therapeutic Activity: 8-22 mins      Jannette Spanner PT, DPT Acute Rehabilitation Services Pager: [phone] Office: [phone]  York Ram E 03/16/2020, 12:49 PM

## 2020-03-17 DIAGNOSIS — U071 COVID-19: Secondary | ICD-10-CM | POA: Diagnosis not present

## 2020-03-17 DIAGNOSIS — J1282 Pneumonia due to coronavirus disease 2019: Secondary | ICD-10-CM | POA: Diagnosis not present

## 2020-03-17 LAB — CBC WITH DIFFERENTIAL/PLATELET
Abs Immature Granulocytes: 0.42 10*3/uL — ABNORMAL HIGH (ref 0.00–0.07)
Basophils Absolute: 0.1 10*3/uL (ref 0.0–0.1)
Basophils Relative: 0 %
Eosinophils Absolute: 0 10*3/uL (ref 0.0–0.5)
Eosinophils Relative: 0 %
HCT: 43.6 % (ref 36.0–46.0)
Hemoglobin: 13.5 g/dL (ref 12.0–15.0)
Immature Granulocytes: 3 %
Lymphocytes Relative: 6 %
Lymphs Abs: 1 10*3/uL (ref 0.7–4.0)
MCH: 27.9 pg (ref 26.0–34.0)
MCHC: 31 g/dL (ref 30.0–36.0)
MCV: 90.1 fL (ref 80.0–100.0)
Monocytes Absolute: 0.7 10*3/uL (ref 0.1–1.0)
Monocytes Relative: 4 %
Neutro Abs: 15 10*3/uL — ABNORMAL HIGH (ref 1.7–7.7)
Neutrophils Relative %: 87 %
Platelets: 411 10*3/uL — ABNORMAL HIGH (ref 150–400)
RBC: 4.84 MIL/uL (ref 3.87–5.11)
RDW: 15.4 % (ref 11.5–15.5)
WBC: 17.1 10*3/uL — ABNORMAL HIGH (ref 4.0–10.5)
nRBC: 0.3 % — ABNORMAL HIGH (ref 0.0–0.2)

## 2020-03-17 LAB — URINALYSIS, ROUTINE W REFLEX MICROSCOPIC
Bacteria, UA: NONE SEEN
Bilirubin Urine: NEGATIVE
Glucose, UA: NEGATIVE mg/dL
Hgb urine dipstick: NEGATIVE
Ketones, ur: 5 mg/dL — AB
Leukocytes,Ua: NEGATIVE
Nitrite: NEGATIVE
Protein, ur: 30 mg/dL — AB
Specific Gravity, Urine: 1.027 (ref 1.005–1.030)
pH: 6 (ref 5.0–8.0)

## 2020-03-17 LAB — COMPREHENSIVE METABOLIC PANEL
ALT: 29 U/L (ref 0–44)
AST: 41 U/L (ref 15–41)
Albumin: 3 g/dL — ABNORMAL LOW (ref 3.5–5.0)
Alkaline Phosphatase: 64 U/L (ref 38–126)
Anion gap: 13 (ref 5–15)
BUN: 22 mg/dL (ref 8–23)
CO2: 23 mmol/L (ref 22–32)
Calcium: 9 mg/dL (ref 8.9–10.3)
Chloride: 107 mmol/L (ref 98–111)
Creatinine, Ser: 0.62 mg/dL (ref 0.44–1.00)
GFR, Estimated: >60 mL/min (ref >60)
Glucose, Bld: 118 mg/dL — ABNORMAL HIGH (ref 70–99)
Potassium: 4.2 mmol/L (ref 3.5–5.1)
Sodium: 143 mmol/L (ref 135–145)
Total Bilirubin: 0.7 mg/dL (ref 0.3–1.2)
Total Protein: 6.7 g/dL (ref 6.5–8.1)

## 2020-03-17 LAB — CULTURE, BLOOD (ROUTINE X 2)
Culture: NO GROWTH
Culture: NO GROWTH
Special Requests: ADEQUATE

## 2020-03-17 LAB — D-DIMER, QUANTITATIVE: D-Dimer, Quant: 8.36 ug/mL-FEU — ABNORMAL HIGH (ref 0.00–0.50)

## 2020-03-17 LAB — MAGNESIUM: Magnesium: 2.2 mg/dL (ref 1.7–2.4)

## 2020-03-17 LAB — C-REACTIVE PROTEIN: CRP: 1.2 mg/dL — ABNORMAL HIGH (ref <1.0)

## 2020-03-17 LAB — PROCALCITONIN: Procalcitonin: <0.1 ng/mL

## 2020-03-17 LAB — BRAIN NATRIURETIC PEPTIDE: B Natriuretic Peptide: 75.1 pg/mL (ref 0.0–100.0)

## 2020-03-17 LAB — VITAMIN B12: Vitamin B-12: 1263 pg/mL — ABNORMAL HIGH (ref 180–914)

## 2020-03-17 LAB — FOLATE: Folate: 18.2 ng/mL (ref >5.9)

## 2020-03-17 MED ORDER — TRAZODONE HCL 50 MG PO TABS
50.0000 mg | ORAL_TABLET | Freq: Every day | ORAL | Status: DC
  Administered 2020-03-17 – 2020-03-20 (×4): 50 mg via ORAL
  Filled 2020-03-17 (×4): qty 1

## 2020-03-17 NOTE — TOC Initial Note (Signed)
Transition of Care [facility]) - Initial/Assessment Note    Patient Details  Name: Stephanie Parks MRN: [id number] Date of Birth: Sep 09, 1945  Transition of [facility]) CM/SW Contact:    Ross Ludwig, LCSW Phone Number: 03/17/2020, 4:42 PM  Clinical Narrative:                  Patient is a 75 year old female who is alert and oriented x2.  Patient lives with her daughter.  Patient was seen by PT, and they are recommending home health PT once she is medically ready for discharge.  CSW to continue to follow patient's progress throughout discharge planning.  Expected Discharge Plan: [place] Barriers to Discharge: Continued Medical Work up   Patient Goals and CMS Choice Patient states their goals for this hospitalization and ongoing recovery are:: To return back home with home health      Expected Discharge Plan and Services Expected Discharge Plan: [place] Acute Care Choice: Home Health                                     Representative spoke with at [place]: Mahala Menghini [phone]  [phone]  James,Felicia Granddaughter   [phone]      Documents on File  Prior Living Arrangements/Services   Lives with:: Self Patient language and need for interpreter reviewed:: Yes Do you feel safe going back to the place where you live?: Yes      Need for Family Participation in Patient Care: Yes (Comment) Care giver support system in place?: No (comment)   Criminal Activity/Legal Involvement Pertinent to Current Situation/Hospitalization: No - Comment as needed  Activities of Daily Living Home Assistive Devices/Equipment: None ADL Screening (condition at time of admission) Patient's cognitive ability adequate to safely complete daily activities?: Yes Is the patient deaf or have difficulty hearing?: No Does the patient have difficulty seeing, even when wearing glasses/contacts?: No Does the patient have difficulty  concentrating, remembering, or making decisions?: No Patient able to express need for assistance with ADLs?: Yes Does the patient have difficulty dressing or bathing?: No Independently performs ADLs?: Yes (appropriate for developmental age) Does the patient have difficulty walking or climbing stairs?: No Weakness of Legs: Both Weakness of Arms/Hands: None  Permission Sought/Granted Permission sought to share information with : Family Nature conservation officer Permission granted to share information with : Yes, Release of Information Signed  Share Information with NAME: Home Health agencies           Emotional Assessment Appearance:: Appears stated age     Orientation: : Oriented to Self,Oriented to Situation Alcohol / Substance Use: Not Applicable Psych Involvement: No (comment)  Admission diagnosis:  Hypoxia [R09.02] Pneumonia due to COVID-19 virus [U07.1, J12.82] COVID-19 [U07.1] Patient Active Problem List   Diagnosis Date Noted  . Pneumonia due to COVID-19 virus 03/12/2020  . Seasonal allergic conjunctivitis 10/04/2019  . Dysfunction of right eustachian tube 10/04/2019  . Pre-diabetes 06/02/2019  . Seasonal and perennial allergic rhinitis 12/22/2018  . Heart palpitations 12/22/2018  . History of chest pain 07/20/2018  . Brain aneurysm 07/20/2018  . Bronchitis 04/23/2018  . Tendonitis, Achilles, right 12/08/2017  . Onychomycosis 12/08/2017  . Unilateral primary osteoarthritis, left knee 10/07/2017  . Ingrown toenail 08/10/2017  . Coughing 04/24/2017  . CAD (coronary artery disease) 03/01/2015  . Aneurysm ([place]) 03/01/2015  .  Dizziness 03/01/2015  . Paresthesia of arm 03/01/2015  . Allergic rhinitis 03/01/2015  . Achalasia 09/28/2012  . CN (constipation) 09/28/2012  . Cephalalgia 08/24/2012  . HTN (hypertension) 04/19/2011  . Hyperlipidemia 04/19/2011  . GERD (gastroesophageal reflux disease) 04/19/2011  . Chest pain 04/04/2011   PCP:  [name],  MD Pharmacy:   [id number] - [place], [place] [place] [address] Phone: [phone] Fax: [phone]     Social Determinants of Health (SDOH) Interventions    Readmission Risk Interventions No flowsheet data found.

## 2020-03-17 NOTE — Progress Notes (Addendum)
PROGRESS NOTE    Stephanie Parks  [id number] DOB: 27-Apr-1945 DOA: 03/12/2020 PCP: [name], MD   Chief Complaint  Patient presents with  . Altered Mental Status  . Shortness of Breath   Brief Narrative: Stephanie Parks a75 y.o.femalewith a history of HTN, HLD, hypokalemia, and triple-vaccinated against covid who presented to the ED 1/23 for some confusion for a few days, shortness of breath for a week, also reporting floaters intermittently for 2 weeks. She underwent CTA chest on 1/21 as an outpatient per her PCP after d-dimer resulted positive as part of work up for shortness of breath/chest discomfort. This was suggestive of multifocal pneumonia. PCP was unable to reach patient with these results, despite many attempts. In triage the patient was hypoxic requiring 5L HFNC, SARS-CoV-2 PCR positive, CRP 9.2, PCT and WBC wnl, BNP 34, troponin 18. Remdesivir, steroids were given and admission requested. Due to progressive hypoxia to requiring 15L, baricitinib was added. CTA revealed acute pulmonary embolism for which lovenox was added.  Assessment & Plan:   Principal Problem:   Pneumonia due to COVID-19 virus Active Problems:   HTN (hypertension)   CAD (coronary artery disease)  Acute Metabolic Encephalopathy  Likely 2/2 covid and acute hospitalization A&Ox1 today, thinks it's the afternoon, doesn't know location.  Neuro exam without focal deficits. With leukocytosis will start infectious w/u -> blood cx, UA.  CXR yesterday with bilateral interstitial markings compatible with atypical infection or edema. Low threshold for abx Consider imaging if worsening/persistent or focal deficit Normal TSH, follow b12, folate.   Delirium precautions Trazodone nightly to help with sleep schedule  Acute hypoxemic respiratory failure due to covid-19 pneumonia and acute pulmonary embolism:In pt vaccinated x3, +on 1/23 with typical CTA chest findings dating to 1/21 and development  of RUL PE on CTA 1/24. - requiring 7 L HFNC today - CT PE protocol with acute PE within proximal R upper lobar artery, COVID 19 pattern pneumonia - CXR 1/27 diffuse bilateral increased interstitial markings compatible with atpyical infection or edema - follow echocardiogram -> EF 55-60%, no RWMA, moderate LVH, diastolic dysfunction, RVSF moderately reduced - continue lovenox 1mg /kg q12h. - Remdesivir (1/23-1/27), solumedrol (1/23-present), baricitinib (1/23 - present) all indicated and being given.  - strict I/O, daily weights - prone as able, OOB, IS, flutter, therapy  COVID-19 Labs  Recent Labs    03/15/20 0447 03/16/20 0423  DDIMER  --  19.44*  FERRITIN  --  261  CRP 1.6* 1.0*    Lab Results  Component Value Date   SARSCOV2NAA POSITIVE (A) 03/12/2020   [place] NEGATIVE 02/22/2019   Leukocytosis: likely related to steroids, but with encephalopathy, will follow cultures, procal, UA.  Afebrile.  Moderately Reduced RVSF  Moderately Elevated PASP Likely related to covid infection and/or PE Will need repeat echocardiogram to follow up after she's improved  AST elevation: Mild, likely due to covid.  - resolved  Hypokalemia:  - Supplemented - Hold thiazide for now  HTN - continue metoprolol, losartan - currently holding thiazide  HLD - continue statin  CAD: Has been evaluated by Dr. Irish Lack as outpt. Troponin very mildly elevated with flat trend and no current chest pain or ischemic ECG changes (suggestive of LVH however) - No inpatient evaluation planned.   Allergic rhinitis:  - Restart home flonase  Obesity: Estimated body mass index is 32.65 kg/m as calculated from the following:   Height as of this encounter: 5\' 5"  (1.651 m).   Weight as of this  encounter: 89 kg.  DVT prophylaxis: lovenox therapeutic  Code Status: full  Family Communication: none at bedside - son Disposition:   Status is: Inpatient  Remains inpatient appropriate  because:Inpatient level of care appropriate due to severity of illness   Dispo: The patient is from: Home              Anticipated d/c is to: pending              Anticipated d/c date is: > 3 days              Patient currently is not medically stable to d/c.   Difficult to place patient No       Consultants:   none  Procedures:  LE [place] Summary:  RIGHT:  - There is no evidence of deep vein thrombosis in the lower extremity.    - No cystic structure found in the popliteal fossa.    LEFT:  - There is no evidence of deep vein thrombosis in the lower extremity.    - No cystic structure found in the popliteal fossa.   Echo IMPRESSIONS    1. Left ventricular ejection fraction, by estimation, is 55 to 60%. The  left ventricle has normal function. The left ventricle has no regional  wall motion abnormalities. There is moderate left ventricular hypertrophy.  Left ventricular diastolic  parameters are consistent with Grade I diastolic dysfunction (impaired  relaxation).  2. Right ventricular systolic function is moderately reduced. The right  ventricular size is moderately enlarged. There is moderately elevated  pulmonary artery systolic pressure.  3. Right atrial size was mildly dilated.  4. The mitral valve is abnormal. Trivial mitral valve regurgitation. No  evidence of mitral stenosis.  5. The aortic valve is tricuspid. Aortic valve regurgitation is not  visualized. No aortic stenosis is present.  6. The inferior vena cava is normal in size with <50% respiratory  variability, suggesting right atrial pressure of 8 mmHg.  Antimicrobials:  Anti-infectives (From admission, onward)   Start     Dose/Rate Route Frequency Ordered Stop   03/13/20 1000  remdesivir 100 mg in sodium chloride 0.9 % 100 mL IVPB        100 mg 200 mL/hr over 30 Minutes Intravenous Daily 03/12/20 1711 03/16/20 1034   03/12/20 1715  remdesivir 100 mg in sodium chloride 0.9 % 100 mL IVPB         100 mg 200 mL/hr over 30 Minutes Intravenous Every 30 min 03/12/20 1711 03/12/20 2301         Subjective: A&Ox1 Insistent that it's the afternoon and that I didn't see her this morning. Even after being shown what time it is, she's insistent it's the afternoon. Unable to say what location this is. No other complaints.  Objective: Vitals:   03/17/20 0500 03/17/20 0724 03/17/20 0800 03/17/20 0807  BP:      Pulse:      Resp:      Temp:      TempSrc:      SpO2:  94% (!) 80% 96%  Weight: 87 kg     Height:       No intake or output data in the 24 hours ending 03/17/20 1003 Filed Weights   03/12/20 2348 03/16/20 0500 03/17/20 0500  Weight: 89 kg 88.8 kg 87 kg    Examination:  General: No acute distress. Cardiovascular: Heart sounds show a regular rate, and rhythm. Lungs: Clear to auscultation bilaterally Abdomen: Soft, nontender, nondistended  Neurological: Alert, but confused. Moves all extremities 4 with equal strength. Cranial nerves II through XII intact.  No focal deficits appreciated on exam.  Skin: Warm and dry. No rashes or lesions. Extremities: No clubbing or cyanosis. No edema.  Data Reviewed: I have personally reviewed following labs and imaging studies  CBC: Recent Labs  Lab 03/13/20 0410 03/14/20 0437 03/15/20 0447 03/16/20 0423 03/17/20 0927  WBC 5.5 10.7* 11.1* 10.5 17.1*  NEUTROABS 4.1 8.2* 8.8* 8.1* 15.0*  HGB 11.0* 11.4* 12.1 12.3 13.5  HCT 35.2* 36.0 37.8 39.4 43.6  MCV 88.2 88.2 86.9 88.5 90.1  PLT 324 350 348 421* 411*    Basic Metabolic Panel: Recent Labs  Lab 03/12/20 1502 03/12/20 1503 03/13/20 0410 03/14/20 0437 03/15/20 0447 03/16/20 0423  NA 143  --  145 147* 144 142  K 2.7*  --  3.1* 3.6 3.8 4.1  CL 98  --  103 105 104 102  CO2 32  --  31 29 29 28   GLUCOSE 125*  --  155* 131* 122* 122*  BUN 15  --  14 19 24* 23  CREATININE 0.98  --  0.76 0.84 0.88 0.90  CALCIUM 8.4*  --  8.1* 8.9 8.7* 9.0  MG  --  2.0 2.1 2.4 2.5*  2.4  PHOS  --   --  2.5  --   --   --     GFR: Estimated Creatinine Clearance: 59.7 mL/min (by C-G formula based on SCr of 0.9 mg/dL).  Liver Function Tests: Recent Labs  Lab 03/12/20 1502 03/13/20 0410 03/14/20 0437 03/15/20 0447 03/16/20 0423  AST 58* 49* 43* 36 33  ALT 26 24 21 22 21   ALKPHOS 58 55 63 65 69  BILITOT 0.7 0.4 0.7 0.5 0.6  PROT 7.4 6.6 6.7 6.3* 6.6  ALBUMIN 3.2* 2.8* 2.8* 2.7* 3.0*    CBG: No results for input(s): GLUCAP in the last 168 hours.   Recent Results (from the past 240 hour(s))  Blood culture (routine x 2)     Status: None   Collection Time: 03/12/20  3:02 PM   Specimen: BLOOD  Result Value Ref Range Status   Specimen Description   Final    BLOOD LEFT ANTECUBITAL Performed at [facility], [address] [place], [place] [address]    Special Requests   Final    BOTTLES DRAWN AEROBIC AND ANAEROBIC Blood Culture adequate volume Performed at [facility], [place] [place], [place] [address]    Culture   Final    NO GROWTH 5 DAYS Performed at [facility], [place] [address] [place], [place] [address]    Report Status 03/17/2020 FINAL  Final  Blood culture (routine x 2)     Status: None   Collection Time: 03/12/20  3:02 PM   Specimen: BLOOD  Result Value Ref Range Status   Specimen Description   Final    BLOOD RIGHT ANTECUBITAL Performed at [facility], [place] [address] [place], [place] [address]    Special Requests   Final    BOTTLES DRAWN AEROBIC ONLY Blood Culture results may not be optimal due to an inadequate volume of blood received in culture bottles Performed at [facility], [address] [place], [place] [address]    Culture   Final    NO GROWTH 5 DAYS Performed at [facility], [place] [address] [place], [place] [address]  Report Status 03/17/2020 FINAL  Final  SARS Coronavirus 2 by RT PCR (hospital order, performed in [facility] hospital lab) Nasopharyngeal  Nasopharyngeal Swab     Status: Abnormal   Collection Time: 03/12/20  3:03 PM   Specimen: Nasopharyngeal Swab  Result Value Ref Range Status   SARS Coronavirus 2 POSITIVE (A) NEGATIVE Final    Comment: RESULT CALLED TO, READ BACK BY AND VERIFIED WITH: Beaman [id number] PHILLIPS C (NOTE) SARS-CoV-2 target nucleic acids are DETECTED  SARS-CoV-2 RNA is generally detectable in upper respiratory specimens  during the acute phase of infection.  Positive results are indicative  of the presence of the identified virus, but do not rule out bacterial infection or co-infection with other pathogens not detected by the test.  Clinical correlation with patient history and  other diagnostic information is necessary to determine patient infection status.  The expected result is negative.  Fact Sheet for Patients:   StrictlyIdeas.no   Fact Sheet for Healthcare Providers:   BankingDealers.co.za    This test is not yet approved or cleared by the [place] FDA and  has been authorized for detection and/or diagnosis of SARS-CoV-2 by FDA under an Emergency Use Authorization (EUA).  This EUA will remain in effect (meaning this tes t can be used) for the duration of  the COVID-19 declaration under Section 564(b)(1) of the Act, 21 U.S.C. section 360-bbb-3(b)(1), unless the authorization is terminated or revoked sooner.  Performed at [facility], [address] [place], [place] [address]          Radiology Studies: DG CHEST PORT 1 VIEW  Result Date: 03/16/2020 CLINICAL DATA:  Shortness of breath.  Pneumonia due to COVID-19. EXAM: PORTABLE CHEST 1 VIEW COMPARISON:  03/12/2020 FINDINGS: Heart size normal. Tortuous thoracic aorta. Bilateral scratch set diffuse increase interstitial markings noted bilaterally. No focal lobar consolidation or signs of pleural effusion. Visualized osseous structures are intact. IMPRESSION: 1. Diffuse  bilateral increase interstitial markings compatible with atypical infection or edema. Electronically Signed   By: Kerby Moors M.D.   On: 03/16/2020 11:28   ECHOCARDIOGRAM COMPLETE  Result Date: 03/15/2020    ECHOCARDIOGRAM REPORT   Patient Name:   [name] Date of Exam: 03/15/2020 Medical Rec #:  [id number]        Height:       65.0 in Accession #:    [id number]       Weight:       196.2 lb Date of Birth:  December 17, 1945        BSA:          1.962 m Patient Age:    29 years         BP:           140/67 mmHg Patient Gender: F                HR:           86 bpm. Exam Location:  Inpatient Procedure: 2D Echo, 3D Echo, Cardiac Doppler and Color Doppler Indications:    I26.02 Pulmonary embolus  History:        Patient has prior history of Echocardiogram examinations, most                 recent 04/16/2011. CAD, Signs/Symptoms:Dizziness/Lightheadedness                 and Chest Pain; Risk Factors:Hypertension and Dyslipidemia.  Covid positive.  Sonographer:    Roseanna Rainbow RDCS Referring Phys: [phone] A CALDWELL POWELL JR  Sonographer Comments: Technically difficult study due to poor echo windows, suboptimal apical window and suboptimal subcostal window. IMPRESSIONS  1. Left ventricular ejection fraction, by estimation, is 55 to 60%. The left ventricle has normal function. The left ventricle has no regional wall motion abnormalities. There is moderate left ventricular hypertrophy. Left ventricular diastolic parameters are consistent with Grade I diastolic dysfunction (impaired relaxation).  2. Right ventricular systolic function is moderately reduced. The right ventricular size is moderately enlarged. There is moderately elevated pulmonary artery systolic pressure.  3. Right atrial size was mildly dilated.  4. The mitral valve is abnormal. Trivial mitral valve regurgitation. No evidence of mitral stenosis.  5. The aortic valve is tricuspid. Aortic valve regurgitation is not visualized. No aortic  stenosis is present.  6. The inferior vena cava is normal in size with <50% respiratory variability, suggesting right atrial pressure of 8 mmHg. FINDINGS  Left Ventricle: Left ventricular ejection fraction, by estimation, is 55 to 60%. The left ventricle has normal function. The left ventricle has no regional wall motion abnormalities. The left ventricular internal cavity size was normal in size. There is  moderate left ventricular hypertrophy. Left ventricular diastolic parameters are consistent with Grade I diastolic dysfunction (impaired relaxation). Right Ventricle: The right ventricular size is moderately enlarged. Right vetricular wall thickness was not assessed. Right ventricular systolic function is moderately reduced. There is moderately elevated pulmonary artery systolic pressure. The tricuspid regurgitant velocity is 2.86 m/s, and with an assumed right atrial pressure of 15 mmHg, the estimated right ventricular systolic pressure is [id number] mmHg. Left Atrium: Left atrial size was normal in size. Right Atrium: Right atrial size was mildly dilated. Pericardium: There is no evidence of pericardial effusion. Mitral Valve: The mitral valve is abnormal. There is mild thickening of the mitral valve leaflet(s). There is mild calcification of the mitral valve leaflet(s). Mild mitral annular calcification. Trivial mitral valve regurgitation. No evidence of mitral valve stenosis. Tricuspid Valve: The tricuspid valve is normal in structure. Tricuspid valve regurgitation is mild . No evidence of tricuspid stenosis. Aortic Valve: The aortic valve is tricuspid. Aortic valve regurgitation is not visualized. No aortic stenosis is present. Pulmonic Valve: The pulmonic valve was normal in structure. Pulmonic valve regurgitation is mild. No evidence of pulmonic stenosis. Aorta: The aortic root is normal in size and structure. Venous: The inferior vena cava is normal in size with less than 50% respiratory variability, suggesting  right atrial pressure of 8 mmHg. IAS/Shunts: No atrial level shunt detected by color flow Doppler.  LEFT VENTRICLE PLAX 2D LVIDd:         3.60 cm     Diastology LVIDs:         2.50 cm     LV e' medial:    10.10 cm/s LV PW:         1.60 cm     LV E/e' medial:  7.2 LV IVS:        1.50 cm     LV e' lateral:   8.39 cm/s LVOT diam:     2.00 cm     LV E/e' lateral: 8.7 LV SV:         62 LV SV Index:   32 LVOT Area:     3.14 cm  LV Volumes (MOD) LV vol d, MOD A2C: 72.5 ml LV vol d, MOD A4C: 95.9 ml LV vol s, MOD  A2C: 23.5 ml LV vol s, MOD A4C: 40.7 ml LV SV MOD A2C:     49.0 ml LV SV MOD A4C:     95.9 ml LV SV MOD BP:      51.2 ml RIGHT VENTRICLE             IVC RV S prime:     15.10 cm/s  IVC diam: 1.70 cm TAPSE (M-mode): 2.0 cm LEFT ATRIUM             Index       RIGHT ATRIUM           Index LA diam:        2.80 cm 1.43 cm/m  RA Area:     12.30 cm LA Vol (A2C):   19.0 ml 9.68 ml/m  RA Volume:   25.60 ml  13.05 ml/m LA Vol (A4C):   23.1 ml 11.77 ml/m LA Biplane Vol: 20.6 ml 10.50 ml/m  AORTIC VALVE LVOT Vmax:   106.00 cm/s LVOT Vmean:  65.000 cm/s LVOT VTI:    0.198 m  AORTA Ao Root diam: 3.40 cm Ao Asc diam:  3.65 cm MITRAL VALVE                TRICUSPID VALVE MV Area (PHT): 5.38 cm     TR Peak grad:   32.7 mmHg MV Decel Time: 141 msec     TR Vmax:        286.00 cm/s MV E velocity: 73.10 cm/s MV A velocity: 124.00 cm/s  SHUNTS MV E/A ratio:  0.59         Systemic VTI:  0.20 m                             Systemic Diam: 2.00 cm Jenkins Rouge MD Electronically signed by Jenkins Rouge MD Signature Date/Time: 03/15/2020/4:32:12 PM    Final         Scheduled Meds: . vitamin C  500 mg Oral QHS  . aspirin  81 mg Oral Daily  . baricitinib  4 mg Oral Daily  . enoxaparin (LOVENOX) injection  90 mg Subcutaneous Q12H  . Ipratropium-Albuterol  1 puff Inhalation QID  . losartan  50 mg Oral Daily  . methylPREDNISolone (SOLU-MEDROL) injection  60 mg Intravenous Q12H  . metoprolol succinate  12.5 mg Oral Daily  .  multivitamin with minerals  1 tablet Oral QHS  . nortriptyline  25 mg Oral QHS  . pantoprazole  40 mg Oral BID  . polyethylene glycol  17 g Oral Daily  . potassium chloride SA  20 mEq Oral BID  . pseudoephedrine  120 mg Oral BID  . rosuvastatin  10 mg Oral Daily  . zinc sulfate  220 mg Oral QHS   Continuous Infusions: . sodium chloride       LOS: 5 days    Time spent: over 30 min    Fayrene Helper, MD [facility]   To contact the attending provider between 7A-7P or the covering provider during after hours 7P-7A, please log into the web site [email or web address] and access using universal [place] password for that web site. If you do not have the password, please call the hospital operator.  03/17/2020, 10:03 AM

## 2020-03-18 DIAGNOSIS — U071 COVID-19: Secondary | ICD-10-CM | POA: Diagnosis not present

## 2020-03-18 DIAGNOSIS — J1282 Pneumonia due to coronavirus disease 2019: Secondary | ICD-10-CM | POA: Diagnosis not present

## 2020-03-18 LAB — CBC WITH DIFFERENTIAL/PLATELET
Abs Immature Granulocytes: 0.37 10*3/uL — ABNORMAL HIGH (ref 0.00–0.07)
Basophils Absolute: 0.1 10*3/uL (ref 0.0–0.1)
Basophils Relative: 0 %
Eosinophils Absolute: 0 10*3/uL (ref 0.0–0.5)
Eosinophils Relative: 0 %
HCT: 40.5 % (ref 36.0–46.0)
Hemoglobin: 13.2 g/dL (ref 12.0–15.0)
Immature Granulocytes: 3 %
Lymphocytes Relative: 13 %
Lymphs Abs: 1.5 10*3/uL (ref 0.7–4.0)
MCH: 28.3 pg (ref 26.0–34.0)
MCHC: 32.6 g/dL (ref 30.0–36.0)
MCV: 86.7 fL (ref 80.0–100.0)
Monocytes Absolute: 0.6 10*3/uL (ref 0.1–1.0)
Monocytes Relative: 5 %
Neutro Abs: 9.4 10*3/uL — ABNORMAL HIGH (ref 1.7–7.7)
Neutrophils Relative %: 79 %
Platelets: 409 10*3/uL — ABNORMAL HIGH (ref 150–400)
RBC: 4.67 MIL/uL (ref 3.87–5.11)
RDW: 15.5 % (ref 11.5–15.5)
WBC: 11.9 10*3/uL — ABNORMAL HIGH (ref 4.0–10.5)
nRBC: 0.2 % (ref 0.0–0.2)

## 2020-03-18 LAB — PROCALCITONIN: Procalcitonin: <0.1 ng/mL

## 2020-03-18 LAB — BASIC METABOLIC PANEL
Anion gap: 12 (ref 5–15)
BUN: 23 mg/dL (ref 8–23)
CO2: 24 mmol/L (ref 22–32)
Calcium: 9 mg/dL (ref 8.9–10.3)
Chloride: 106 mmol/L (ref 98–111)
Creatinine, Ser: 0.89 mg/dL (ref 0.44–1.00)
GFR, Estimated: >60 mL/min (ref >60)
Glucose, Bld: 132 mg/dL — ABNORMAL HIGH (ref 70–99)
Potassium: 4.3 mmol/L (ref 3.5–5.1)
Sodium: 142 mmol/L (ref 135–145)

## 2020-03-18 LAB — COMPREHENSIVE METABOLIC PANEL
ALT: 30 U/L (ref 0–44)
AST: 50 U/L — ABNORMAL HIGH (ref 15–41)
Albumin: 3 g/dL — ABNORMAL LOW (ref 3.5–5.0)
Alkaline Phosphatase: 60 U/L (ref 38–126)
Anion gap: 16 — ABNORMAL HIGH (ref 5–15)
BUN: 23 mg/dL (ref 8–23)
CO2: 24 mmol/L (ref 22–32)
Calcium: 9.1 mg/dL (ref 8.9–10.3)
Chloride: 105 mmol/L (ref 98–111)
Creatinine, Ser: 1.05 mg/dL — ABNORMAL HIGH (ref 0.44–1.00)
GFR, Estimated: 56 mL/min — ABNORMAL LOW (ref >60)
Glucose, Bld: 102 mg/dL — ABNORMAL HIGH (ref 70–99)
Potassium: 5.2 mmol/L — ABNORMAL HIGH (ref 3.5–5.1)
Sodium: 145 mmol/L (ref 135–145)
Total Bilirubin: 1.2 mg/dL (ref 0.3–1.2)
Total Protein: 7 g/dL (ref 6.5–8.1)

## 2020-03-18 LAB — FERRITIN: Ferritin: 194 ng/mL (ref 11–307)

## 2020-03-18 LAB — C-REACTIVE PROTEIN: CRP: 1.3 mg/dL — ABNORMAL HIGH (ref <1.0)

## 2020-03-18 LAB — D-DIMER, QUANTITATIVE: D-Dimer, Quant: 8.36 ug/mL-FEU — ABNORMAL HIGH (ref 0.00–0.50)

## 2020-03-18 LAB — MAGNESIUM: Magnesium: 2.5 mg/dL — ABNORMAL HIGH (ref 1.7–2.4)

## 2020-03-18 LAB — AMMONIA: Ammonia: 35 umol/L (ref 9–35)

## 2020-03-18 LAB — PHOSPHORUS: Phosphorus: 4.1 mg/dL (ref 2.5–4.6)

## 2020-03-18 MED ORDER — BARICITINIB 2 MG PO TABS
2.0000 mg | ORAL_TABLET | Freq: Every day | ORAL | Status: DC
  Administered 2020-03-19: 2 mg via ORAL
  Filled 2020-03-18: qty 1

## 2020-03-18 NOTE — Plan of Care (Signed)
Weaned patient down to 2 liters oxygen at rest on 7 a to 7 pshift, sats in the low to mid 90's.  Patient up in chair entire shift, continues to be intermittently confused and irritable.

## 2020-03-18 NOTE — Plan of Care (Signed)
  Problem: Coping: Goal: Psychosocial and spiritual needs will be supported Outcome: Progressing   Problem: Respiratory: Goal: Will maintain a patent airway Outcome: Progressing Goal: Complications related to the disease process, condition or treatment will be avoided or minimized Outcome: Progressing   Problem: Health Behavior/Discharge Planning: Goal: Ability to manage health-related needs will improve Outcome: Progressing   Problem: Clinical Measurements: Goal: Ability to maintain clinical measurements within normal limits will improve Outcome: Progressing Goal: Will remain free from infection Outcome: Progressing Goal: Diagnostic test results will improve Outcome: Progressing Goal: Respiratory complications will improve Outcome: Progressing   Problem: Activity: Goal: Risk for activity intolerance will decrease Outcome: Progressing   Problem: Nutrition: Goal: Adequate nutrition will be maintained Outcome: Progressing   Problem: Coping: Goal: Level of anxiety will decrease Outcome: Progressing   Problem: Elimination: Goal: Will not experience complications related to bowel motility Outcome: Progressing Goal: Will not experience complications related to urinary retention Outcome: Progressing   Problem: Pain Managment: Goal: General experience of comfort will improve Outcome: Progressing   Problem: Safety: Goal: Ability to remain free from injury will improve Outcome: Progressing   

## 2020-03-18 NOTE — Progress Notes (Signed)
PROGRESS NOTE    Stephanie Parks  DDU:[id number] DOB: 01/23/1946 DOA: 03/12/2020 PCP: [name], MD   Chief Complaint  Patient presents with  . Altered Mental Status  . Shortness of Breath   Brief Narrative: Stephanie Parks a74 y.o.femalewith Stephanie Parks history of HTN, HLD, hypokalemia, and triple-vaccinated against covid who presented to the ED 1/23 for some confusion for Stephanie Parks few days, shortness of breath for Stephanie Parks week, also reporting floaters intermittently for 2 weeks. She underwent CTA chest on 1/21 as an outpatient per her PCP after d-dimer resulted positive as part of work up for shortness of breath/chest discomfort. This was suggestive of multifocal pneumonia. PCP was unable to reach patient with these results, despite many attempts. In triage the patient was hypoxic requiring 5L HFNC, SARS-CoV-2 PCR positive, CRP 9.2, PCT and WBC wnl, BNP 34, troponin 18. Remdesivir, steroids were given and admission requested. Due to progressive hypoxia to requiring 15L, baricitinib was added. CTA revealed acute pulmonary embolism for which lovenox was added.  Assessment & Plan:   Principal Problem:   Pneumonia due to COVID-19 virus Active Problems:   HTN (hypertension)   CAD (coronary artery disease)  Acute Metabolic Encephalopathy  Likely 2/2 covid and acute hospitalization Stephanie Parks&Ox2 today (know[facility], but can't remember [facility], knows 2022).  Neuro exam without focal deficits.  Improved from yesterday. With leukocytosis will start infectious w/u -> blood cx, UA.  CXR yesterday with bilateral interstitial markings compatible with atypical infection or edema. Low threshold for abx Consider imaging if worsening/persistent or focal deficit Normal TSH, follow b12 (elevated), folate (wnl), ammonia wnl.   Delirium precautions Trazodone nightly to help with sleep schedule.  Tricyclic d/c'd.  Acute hypoxemic respiratory failure due to covid-19 pneumonia and acute pulmonary embolism:In pt vaccinated x3,  +on 1/23 with typical CTA chest findings dating to 1/21 and development of RUL PE on CTA 1/24. - weaning o2 today, but having Stephanie Parks hard time with pulse ox, appreciate nursing working to remedy this  - CT PE protocol with acute PE within proximal R upper lobar artery, COVID 19 pattern pneumonia - CXR 1/27 diffuse bilateral increased interstitial markings compatible with atpyical infection or edema - follow echocardiogram -> EF 55-60%, no RWMA, moderate LVH, diastolic dysfunction, RVSF moderately reduced - continue lovenox 1mg /kg q12h. - Remdesivir (1/23-1/27), solumedrol (1/23-present), baricitinib (1/23 - present) all indicated and being given.  - strict I/O, daily weights - prone as able, OOB, IS, flutter, therapy  COVID-19 Labs  Recent Labs    03/16/20 0423 03/17/20 0927 03/17/20 1321 03/18/20 0539  DDIMER 19.44* 8.36*  --  8.36*  FERRITIN 261  --   --  194  CRP 1.0*  --  1.2* 1.3*    Lab Results  Component Value Date   SARSCOV2NAA POSITIVE ([name]) 03/12/2020   [place] NEGATIVE 02/22/2019   Leukocytosis: likely related to steroids, but with encephalopathy, will follow cultures, procal, UA.  Afebrile. Improved today, follow  AKI Creatinine rising today, follow   Moderately Reduced RVSF  Moderately Elevated PASP Likely related to covid infection and/or PE Will need repeat echocardiogram to follow up after she's improved  AST elevation: Mild, likely due to covid.  - resolved - if continues to worsen, would hold arb (continue for now)  Hypokalemia  Hyperkalmia:  - Supplemented - now hyperkalemia, mild, follow - Hold thiazide for now  HTN - continue metoprolol, losartan - currently holding thiazide  HLD - continue statin  CAD: Has been evaluated by Dr. Irish Lack  as outpt. Troponin very mildly elevated with flat trend and no current chest pain or ischemic ECG changes (suggestive of LVH however) - No inpatient evaluation planned.   Allergic rhinitis:  - Restart  home flonase  Obesity: Estimated body mass index is 32.65 kg/m as calculated from the following:   Height as of this encounter: 5\' 5"  (1.651 m).   Weight as of this encounter: 89 kg.  DVT prophylaxis: lovenox therapeutic  Code Status: full  Family Communication: none at bedside - son Disposition:   Status is: Inpatient  Remains inpatient appropriate because:Inpatient level of care appropriate due to severity of illness   Dispo: The patient is from: Home              Anticipated d/c is to: pending              Anticipated d/c date is: > 3 days              Patient currently is not medically stable to d/c.   Difficult to place patient No       Consultants:   none  Procedures:  LE [place] Summary:  RIGHT:  - There is no evidence of deep vein thrombosis in the lower extremity.    - No cystic structure found in the popliteal fossa.    LEFT:  - There is no evidence of deep vein thrombosis in the lower extremity.    - No cystic structure found in the popliteal fossa.   Echo IMPRESSIONS    1. Left ventricular ejection fraction, by estimation, is 55 to 60%. The  left ventricle has normal function. The left ventricle has no regional  wall motion abnormalities. There is moderate left ventricular hypertrophy.  Left ventricular diastolic  parameters are consistent with Grade I diastolic dysfunction (impaired  relaxation).  2. Right ventricular systolic function is moderately reduced. The right  ventricular size is moderately enlarged. There is moderately elevated  pulmonary artery systolic pressure.  3. Right atrial size was mildly dilated.  4. The mitral valve is abnormal. Trivial mitral valve regurgitation. No  evidence of mitral stenosis.  5. The aortic valve is tricuspid. Aortic valve regurgitation is not  visualized. No aortic stenosis is present.  6. The inferior vena cava is normal in size with <50% respiratory  variability, suggesting right atrial  pressure of 8 mmHg.  Antimicrobials:  Anti-infectives (From admission, onward)   Start     Dose/Rate Route Frequency Ordered Stop   03/13/20 1000  remdesivir 100 mg in sodium chloride 0.9 % 100 mL IVPB        100 mg 200 mL/hr over 30 Minutes Intravenous Daily 03/12/20 1711 03/16/20 1034   03/12/20 1715  remdesivir 100 mg in sodium chloride 0.9 % 100 mL IVPB        100 mg 200 mL/hr over 30 Minutes Intravenous Every 30 min 03/12/20 1711 03/12/20 2301         Subjective: Kylyn Sookram&Ox2 Asking about going home (discussed need to make sure she's on safe level of oxygen before she can d/c)  Objective: Vitals:   03/18/20 0614 03/18/20 0757 03/18/20 0802 03/18/20 0816  BP: (!) 163/93     Pulse: 93     Resp: 20     Temp: 98 F (36.7 C)     TempSrc: Oral     SpO2: 98% 100% 100% 96%  Weight:      Height:        Intake/Output Summary (Last 24  hours) at 03/18/2020 1032 Last data filed at 03/18/2020 0600 Gross per 24 hour  Intake 120 ml  Output 550 ml  Net -430 ml   Filed Weights   03/12/20 2348 03/16/20 0500 03/17/20 0500  Weight: 89 kg 88.8 kg 87 kg    Examination:  General: No acute distress. Cardiovascular: Heart sounds show Nakyra Bourn regular rate, and rhythm.  Lungs: Clear to auscultation bilaterally Abdomen: Soft, nontender, nondistended  Neurological: Alert and oriented 2. Moves all extremities 4 . Cranial nerves II through XII grossly intact. Skin: Warm and dry. No rashes or lesions. Extremities: No clubbing or cyanosis. No edema.  e  Data Reviewed: I have personally reviewed following labs and imaging studies  CBC: Recent Labs  Lab 03/14/20 0437 03/15/20 0447 03/16/20 0423 03/17/20 0927 03/18/20 0539  WBC 10.7* 11.1* 10.5 17.1* 11.9*  NEUTROABS 8.2* 8.8* 8.1* 15.0* 9.4*  HGB 11.4* 12.1 12.3 13.5 13.2  HCT 36.0 37.8 39.4 43.6 40.5  MCV 88.2 86.9 88.5 90.1 86.7  PLT 350 348 421* 411* 409*    Basic Metabolic Panel: Recent Labs  Lab 03/13/20 0410 03/14/20 0437  03/15/20 0447 03/16/20 0423 03/17/20 0927 03/18/20 0539  NA 145 147* 144 142 143 145  K 3.1* 3.6 3.8 4.1 4.2 5.2*  CL 103 105 104 102 107 105  CO2 31 29 29 28 23 24   GLUCOSE 155* 131* 122* 122* 118* 102*  BUN 14 19 24* 23 22 23   CREATININE 0.76 0.84 0.88 0.90 0.62 1.05*  CALCIUM 8.1* 8.9 8.7* 9.0 9.0 9.1  MG 2.1 2.4 2.5* 2.4 2.2 2.5*  PHOS 2.5  --   --   --   --  4.1    GFR: Estimated Creatinine Clearance: 51.2 mL/min (Eladio Dentremont) (by C-G formula based on SCr of 1.05 mg/dL (H)).  Liver Function Tests: Recent Labs  Lab 03/14/20 0437 03/15/20 0447 03/16/20 0423 03/17/20 0927 03/18/20 0539  AST 43* 36 33 41 50*  ALT 21 22 21 29 30   ALKPHOS 63 65 69 64 60  BILITOT 0.7 0.5 0.6 0.7 1.2  PROT 6.7 6.3* 6.6 6.7 7.0  ALBUMIN 2.8* 2.7* 3.0* 3.0* 3.0*    CBG: No results for input(s): GLUCAP in the last 168 hours.   Recent Results (from the past 240 hour(s))  Blood culture (routine x 2)     Status: None   Collection Time: 03/12/20  3:02 PM   Specimen: BLOOD  Result Value Ref Range Status   Specimen Description   Final    BLOOD LEFT ANTECUBITAL Performed at [facility], [address] [place], [place] [address]    Special Requests   Final    BOTTLES DRAWN AEROBIC AND ANAEROBIC Blood Culture adequate volume Performed at [facility], [address] [place], [place] [address]    Culture   Final    NO GROWTH 5 DAYS Performed at [facility], [place] [address], [place], [place] [address]    Report Status 03/17/2020 FINAL  Final  Blood culture (routine x 2)     Status: None   Collection Time: 03/12/20  3:02 PM   Specimen: BLOOD  Result Value Ref Range Status   Specimen Description   Final    BLOOD RIGHT ANTECUBITAL Performed at [facility], [address], [place], [place] [address]    Special Requests   Final    BOTTLES DRAWN AEROBIC ONLY Blood Culture results may not be optimal due to an  inadequate volume of blood received in culture  bottles Performed at [facility] [place], [address] [place], [place] [address]    Culture   Final    NO GROWTH 5 DAYS Performed at [facility], [address] [place], [place] [address]    Report Status 03/17/2020 FINAL  Final  SARS Coronavirus 2 by RT PCR (hospital order, performed in [facility] hospital lab) Nasopharyngeal Nasopharyngeal Swab     Status: Abnormal   Collection Time: 03/12/20  3:03 PM   Specimen: Nasopharyngeal Swab  Result Value Ref Range Status   SARS Coronavirus 2 POSITIVE (Charlet Harr) NEGATIVE Final    Comment: RESULT CALLED TO, READ BACK BY AND VERIFIED WITHHeywood Bene: GOUGE S RN [phone] [id number] PHILLIPS C (NOTE) SARS-CoV-2 target nucleic acids are DETECTED  SARS-CoV-2 RNA is generally detectable in upper respiratory specimens  during the acute phase of infection.  Positive results are indicative  of the presence of the identified virus, but do not rule out bacterial infection or co-infection with other pathogens not detected by the test.  Clinical correlation with patient history and  other diagnostic information is necessary to determine patient infection status.  The expected result is negative.  Fact Sheet for Patients:   BoilerBrush.com.cyhttps://www.fda.gov/media/136312/download   Fact Sheet for Healthcare Providers:   https://pope.com/https://www.fda.gov/media/136313/download    This test is not yet approved or cleared by the [place] FDA and  has been authorized for detection and/or diagnosis of SARS-CoV-2 by FDA under an Emergency Use Authorization (EUA).  This EUA will remain in effect (meaning this tes t can be used) for the duration of  the COVID-19 declaration under Section 564(b)(1) of the Act, 21 U.S.C. section 360-bbb-3(b)(1), unless the authorization is terminated or revoked sooner.  Performed at [facility] [place], [address] [place], [place] [address]          Radiology Studies: DG CHEST PORT 1 VIEW  Result Date: 03/16/2020 CLINICAL DATA:   Shortness of breath.  Pneumonia due to COVID-19. EXAM: PORTABLE CHEST 1 VIEW COMPARISON:  03/12/2020 FINDINGS: Heart size normal. Tortuous thoracic aorta. Bilateral scratch set diffuse increase interstitial markings noted bilaterally. No focal lobar consolidation or signs of pleural effusion. Visualized osseous structures are intact. IMPRESSION: 1. Diffuse bilateral increase interstitial markings compatible with atypical infection or edema. Electronically Signed   By: Signa Kellaylor  Stroud M.D.   On: 03/16/2020 11:28        Scheduled Meds: . vitamin C  500 mg Oral QHS  . aspirin  81 mg Oral Daily  . baricitinib  4 mg Oral Daily  . enoxaparin (LOVENOX) injection  90 mg Subcutaneous Q12H  . Ipratropium-Albuterol  1 puff Inhalation QID  . losartan  50 mg Oral Daily  . methylPREDNISolone (SOLU-MEDROL) injection  60 mg Intravenous Q12H  . metoprolol succinate  12.5 mg Oral Daily  . multivitamin with minerals  1 tablet Oral QHS  . pantoprazole  40 mg Oral BID  . polyethylene glycol  17 g Oral Daily  . potassium chloride SA  20 mEq Oral BID  . pseudoephedrine  120 mg Oral BID  . rosuvastatin  10 mg Oral Daily  . traZODone  50 mg Oral QHS  . zinc sulfate  220 mg Oral QHS   Continuous Infusions: . sodium chloride       LOS: 6 days    Time spent: over 30 min    Lacretia Nicksaldwell Powell, MD [facility]   To contact the attending  provider between 7A-7P or the covering provider during after hours 7P-7A, please log into the web site [email or web address] and access using universal [place] password for that web site. If you do not have the password, please call the hospital operator.  03/18/2020, 10:32 AM

## 2020-03-18 NOTE — Progress Notes (Signed)
SATURATION QUALIFICATIONS: (This note is used to comply with regulatory documentation for home oxygen)  Patient Saturations on Room Air at Rest = 86%  Patient Saturations on Room Air while Ambulating = na  Patient Saturations on 4 Liters of oxygen while Ambulating = 88%  Please briefly explain why patient needs home oxygen: desats with exertion, requires 4 liters to maintain sats 88%; 92% on 2 liters at rest

## 2020-03-19 DIAGNOSIS — U071 COVID-19: Secondary | ICD-10-CM | POA: Diagnosis not present

## 2020-03-19 DIAGNOSIS — J1282 Pneumonia due to coronavirus disease 2019: Secondary | ICD-10-CM | POA: Diagnosis not present

## 2020-03-19 LAB — CBC WITH DIFFERENTIAL/PLATELET
Abs Immature Granulocytes: 0.3 10*3/uL — ABNORMAL HIGH (ref 0.00–0.07)
Basophils Absolute: 0 10*3/uL (ref 0.0–0.1)
Basophils Relative: 0 %
Eosinophils Absolute: 0 10*3/uL (ref 0.0–0.5)
Eosinophils Relative: 0 %
HCT: 36.9 % (ref 36.0–46.0)
Hemoglobin: 11.7 g/dL — ABNORMAL LOW (ref 12.0–15.0)
Immature Granulocytes: 2 %
Lymphocytes Relative: 10 %
Lymphs Abs: 1.3 10*3/uL (ref 0.7–4.0)
MCH: 28.1 pg (ref 26.0–34.0)
MCHC: 31.7 g/dL (ref 30.0–36.0)
MCV: 88.5 fL (ref 80.0–100.0)
Monocytes Absolute: 0.6 10*3/uL (ref 0.1–1.0)
Monocytes Relative: 5 %
Neutro Abs: 10.4 10*3/uL — ABNORMAL HIGH (ref 1.7–7.7)
Neutrophils Relative %: 83 %
Platelets: 465 10*3/uL — ABNORMAL HIGH (ref 150–400)
RBC: 4.17 MIL/uL (ref 3.87–5.11)
RDW: 15.6 % — ABNORMAL HIGH (ref 11.5–15.5)
WBC: 12.6 10*3/uL — ABNORMAL HIGH (ref 4.0–10.5)
nRBC: 0.2 % (ref 0.0–0.2)

## 2020-03-19 LAB — D-DIMER, QUANTITATIVE: D-Dimer, Quant: 2.9 ug/mL-FEU — ABNORMAL HIGH (ref 0.00–0.50)

## 2020-03-19 LAB — PHOSPHORUS: Phosphorus: 4 mg/dL (ref 2.5–4.6)

## 2020-03-19 LAB — COMPREHENSIVE METABOLIC PANEL
ALT: 27 U/L (ref 0–44)
AST: 27 U/L (ref 15–41)
Albumin: 2.8 g/dL — ABNORMAL LOW (ref 3.5–5.0)
Alkaline Phosphatase: 54 U/L (ref 38–126)
Anion gap: 9 (ref 5–15)
BUN: 23 mg/dL (ref 8–23)
CO2: 26 mmol/L (ref 22–32)
Calcium: 8.8 mg/dL — ABNORMAL LOW (ref 8.9–10.3)
Chloride: 105 mmol/L (ref 98–111)
Creatinine, Ser: 0.76 mg/dL (ref 0.44–1.00)
GFR, Estimated: >60 mL/min (ref >60)
Glucose, Bld: 109 mg/dL — ABNORMAL HIGH (ref 70–99)
Potassium: 4.6 mmol/L (ref 3.5–5.1)
Sodium: 140 mmol/L (ref 135–145)
Total Bilirubin: 0.6 mg/dL (ref 0.3–1.2)
Total Protein: 6.2 g/dL — ABNORMAL LOW (ref 6.5–8.1)

## 2020-03-19 LAB — MAGNESIUM: Magnesium: 2.5 mg/dL — ABNORMAL HIGH (ref 1.7–2.4)

## 2020-03-19 LAB — FERRITIN: Ferritin: 169 ng/mL (ref 11–307)

## 2020-03-19 LAB — C-REACTIVE PROTEIN: CRP: 1 mg/dL — ABNORMAL HIGH (ref <1.0)

## 2020-03-19 MED ORDER — PREDNISONE 20 MG PO TABS: 20.0000 mg | ORAL_TABLET | Freq: Every day | ORAL | Status: DC

## 2020-03-19 MED ORDER — BARICITINIB 2 MG PO TABS
4.0000 mg | ORAL_TABLET | Freq: Every day | ORAL | Status: DC
  Administered 2020-03-20 – 2020-03-21 (×2): 4 mg via ORAL
  Filled 2020-03-19 (×2): qty 2

## 2020-03-19 MED ORDER — PREDNISONE 10 MG PO TABS: 10.0000 mg | ORAL_TABLET | Freq: Every day | ORAL | Status: DC

## 2020-03-19 MED ORDER — PREDNISONE 20 MG PO TABS: 30.0000 mg | ORAL_TABLET | Freq: Every day | ORAL | Status: DC

## 2020-03-19 MED ORDER — PREDNISONE 20 MG PO TABS
40.0000 mg | ORAL_TABLET | Freq: Every day | ORAL | Status: AC
  Administered 2020-03-20 – 2020-03-21 (×2): 40 mg via ORAL
  Filled 2020-03-19 (×2): qty 2

## 2020-03-19 MED ORDER — LIP MEDEX EX OINT
TOPICAL_OINTMENT | CUTANEOUS | Status: AC
  Filled 2020-03-19: qty 7

## 2020-03-19 MED ORDER — APIXABAN 5 MG PO TABS: 5.0000 mg | ORAL_TABLET | Freq: Two times a day (BID) | ORAL | Status: DC

## 2020-03-19 MED ORDER — APIXABAN 5 MG PO TABS
10.0000 mg | ORAL_TABLET | Freq: Two times a day (BID) | ORAL | Status: DC
  Administered 2020-03-19 – 2020-03-21 (×4): 10 mg via ORAL
  Filled 2020-03-19 (×4): qty 2

## 2020-03-19 NOTE — Plan of Care (Signed)
  Problem: Coping: Goal: Psychosocial and spiritual needs will be supported Outcome: Progressing   Problem: Respiratory: Goal: Will maintain a patent airway Outcome: Progressing Goal: Complications related to the disease process, condition or treatment will be avoided or minimized Outcome: Progressing   Problem: Health Behavior/Discharge Planning: Goal: Ability to manage health-related needs will improve Outcome: Progressing   Problem: Clinical Measurements: Goal: Ability to maintain clinical measurements within normal limits will improve Outcome: Progressing Goal: Will remain free from infection Outcome: Progressing Goal: Diagnostic test results will improve Outcome: Progressing Goal: Respiratory complications will improve Outcome: Progressing   Problem: Activity: Goal: Risk for activity intolerance will decrease Outcome: Progressing   Problem: Nutrition: Goal: Adequate nutrition will be maintained Outcome: Progressing   Problem: Coping: Goal: Level of anxiety will decrease Outcome: Progressing   Problem: Elimination: Goal: Will not experience complications related to bowel motility Outcome: Progressing Goal: Will not experience complications related to urinary retention Outcome: Progressing   Problem: Pain Managment: Goal: General experience of comfort will improve Outcome: Progressing   Problem: Safety: Goal: Ability to remain free from injury will improve Outcome: Progressing   

## 2020-03-19 NOTE — Discharge Instructions (Signed)
Information on my medicine - ELIQUIS (apixaban)  This medication education was reviewed with me or my healthcare representative as part of my discharge preparation.    Why was Eliquis prescribed for you? Eliquis was prescribed to treat blood clots that may have been found in the veins of your legs (deep vein thrombosis) or in your lungs (pulmonary embolism) and to reduce the risk of them occurring again.  What do You need to know about Eliquis ? The starting dose is 10 mg (two 5 mg tablets) taken TWICE daily for the FIRST SEVEN (7) DAYS, then on 03/26/20  the dose is reduced to ONE 5 mg tablet taken TWICE daily.  Eliquis may be taken with or without food.   Try to take the dose about the same time in the morning and in the evening. If you have difficulty swallowing the tablet whole please discuss with your pharmacist how to take the medication safely.  Take Eliquis exactly as prescribed and DO NOT stop taking Eliquis without talking to the doctor who prescribed the medication.  Stopping may increase your risk of developing a new blood clot.  Refill your prescription before you run out.  After discharge, you should have regular check-up appointments with your healthcare provider that is prescribing your Eliquis.    What do you do if you miss a dose? If a dose of ELIQUIS is not taken at the scheduled time, take it as soon as possible on the same day and twice-daily administration should be resumed. The dose should not be doubled to make up for a missed dose.  Important Safety Information A possible side effect of Eliquis is bleeding. You should call your healthcare provider right away if you experience any of the following: ? Bleeding from an injury or your nose that does not stop. ? Unusual colored urine (red or dark brown) or unusual colored stools (red or black). ? Unusual bruising for unknown reasons. ? A serious fall or if you hit your head (even if there is no bleeding).  Some  medicines may interact with Eliquis and might increase your risk of bleeding or clotting while on Eliquis. To help avoid this, consult your healthcare provider or pharmacist prior to using any new prescription or non-prescription medications, including herbals, vitamins, non-steroidal anti-inflammatory drugs (NSAIDs) and supplements.  This website has more information on Eliquis (apixaban): http://www.eliquis.com/eliquis/home

## 2020-03-19 NOTE — Progress Notes (Signed)
Informed physical therapist about seeing the patient again, they already have a full load per PT and if if they are able today they will see but if not it will be tomorrow.

## 2020-03-19 NOTE — Progress Notes (Signed)
[place] for lovenox --> Eliquis Indication: acute pulmonary embolus  Allergies  Allergen Reactions  . Shellfish Allergy Hives and Swelling  . Ace Inhibitors Other (See Comments)    Pt cannot recall her reaction but tolerates arb   . Gabapentin Other (See Comments)    headaches  . Pitavastatin     Unknown reaction   . Topiramate     Heart race,   . Sulfa Antibiotics Other (See Comments) and Rash    Fine bumps    Patient Measurements: Height: 5\' 5"  (165.1 cm) Weight: 86.4 kg (190 lb 7.6 oz) IBW/kg (Calculated) : 57 Heparin Dosing Weight:   Vital Signs: Temp: 97.9 F (36.6 C) (01/30 0435) BP: 126/61 (01/30 1035) Pulse Rate: 105 (01/30 1035)  Labs: Recent Labs    03/17/20 0927 03/18/20 0539 03/18/20 1626 03/19/20 0429  HGB 13.5 13.2  --  11.7*  HCT 43.6 40.5  --  36.9  PLT 411* 409*  --  465*  CREATININE 0.62 1.05* 0.89 0.76    Estimated Creatinine Clearance: 67 mL/min (by C-G formula based on SCr of 0.76 mg/dL).   Assessment: Patient is a 75 y.o F presented to the ED on 1/23 with c/o SOB and was found to have COVID-19. Chest CTA on 1/24 showed acute RUL PE with no evidence of right hear strain.  Pharmacy has been consulted to transition patient from full dose lovenox to Eliquis.  Today, 03/19/2020: - hgb down 11.7, plts 465K - no bleeding documented - Scr 0.76 (crcl~67) - pt received lovenox 90 mg dose at 1034 this morning    Plan:  - d/c lovenox - start eliquis 10 mg bid x7 days, then 5 mg bid  -pharmacy will sign off for Eliquis, but will follow patient peripherally along with you  Devin Going, Sarahi Borland P 03/19/2020,12:27 PM

## 2020-03-19 NOTE — Progress Notes (Signed)
PROGRESS NOTE    Stephanie Parks  [id number] DOB: [birth year] DOA: 03/12/2020 PCP: [name], MD   Chief Complaint  Patient presents with  . Altered Mental Status  . Shortness of Breath   Brief Narrative: Stephanie Parks a74 y.o.femalewith Stephanie Parks history of HTN, HLD, hypokalemia, and triple-vaccinated against covid who presented to the ED 1/23 for some confusion for Stephanie Parks few days, shortness of breath for Stephanie Parks week, also reporting floaters intermittently for 2 weeks. She underwent CTA chest on 1/21 as an outpatient per her PCP after d-dimer resulted positive as part of work up for shortness of breath/chest discomfort. This was suggestive of multifocal pneumonia. PCP was unable to reach patient with these results, despite many attempts. In triage the patient was hypoxic requiring 5L HFNC, SARS-CoV-2 PCR positive, CRP 9.2, PCT and WBC wnl, BNP 34, troponin 18. Remdesivir, steroids were given and admission requested. Due to progressive hypoxia to requiring 15L, baricitinib was added. CTA revealed acute pulmonary embolism for which lovenox was added.  Assessment & Plan:   Principal Problem:   Pneumonia due to COVID-19 virus Active Problems:   HTN (hypertension)   CAD (coronary artery disease)  Acute Metabolic Encephalopathy  Likely 2/2 covid and acute hospitalization Confusion is improving, though she again seems to think it's the afternoon this morning With leukocytosis will start infectious w/u -> blood cx - NG, UA - not concerning for UTI.  CXR with bilateral interstitial markings compatible with atypical infection or edema. Consider imaging if worsening/persistent or focal deficit Normal TSH, follow b12 (elevated), folate (wnl), ammonia wnl.   Delirium precautions Trazodone nightly to help with sleep schedule.  Tricyclic d/c'd.  Acute hypoxemic respiratory failure due to covid-19 pneumonia and acute pulmonary embolism:In pt vaccinated x3, +on 1/23 with typical CTA chest  findings dating to 1/21 and development of RUL PE on CTA 1/24. - currently requiring 2 L at rest and 4 L with activity - will continue to wean O2 as tolerated  - CT PE protocol with acute PE within proximal R upper lobar artery, COVID 19 pattern pneumonia - CXR 1/27 diffuse bilateral increased interstitial markings compatible with atpyical infection or edema - follow echocardiogram -> EF 55-60%, no RWMA, moderate LVH, diastolic dysfunction, RVSF moderately reduced - continue lovenox 1mg /kg q12h -> transition to eliquis - Remdesivir (1/23-1/27), steroid taper (1/23-present), baricitinib (1/23 - present) all indicated and being given.  - strict I/O, daily weights - prone as able, OOB, IS, flutter, therapy  COVID-19 Labs  Recent Labs    03/17/20 0927 03/17/20 1321 03/18/20 0539 03/19/20 0429  DDIMER 8.36*  --  8.36* 2.90*  FERRITIN  --   --  194 169  CRP  --  1.2* 1.3* 1.0*    Lab Results  Component Value Date   SARSCOV2NAA POSITIVE (Stephanie Parks) 03/12/2020   Hydro NEGATIVE 02/22/2019   Leukocytosis: improved, likely related to steroids, follow  AKI improved  Moderately Reduced RVSF  Moderately Elevated PASP Likely related to covid infection and/or PE Will need repeat echocardiogram to follow up after she's improved  AST elevation: Mild, likely due to covid.  - resolved  Hypokalemia  Hyperkalmia:  - Supplemented - hyperk resolved - Hold thiazide for now  HTN - continue metoprolol, losartan - currently holding thiazide  HLD - continue statin  CAD: Has been evaluated by Dr. Irish Parks as outpt. Troponin very mildly elevated with flat trend and no current chest pain or ischemic ECG changes (suggestive of LVH however) - No inpatient  evaluation planned.   Allergic rhinitis:  - Restart home flonase  Obesity: Estimated body mass index is 32.65 kg/m as calculated from the following:   Height as of this encounter: 5\' 5"  (1.651 m).   Weight as of this encounter: 89  kg.  DVT prophylaxis: lovenox therapeutic  Code Status: full  Family Communication: none at bedside - son Disposition:   Status is: Inpatient  Remains inpatient appropriate because:Inpatient level of care appropriate due to severity of illness   Dispo: The patient is from: Home              Anticipated d/c is to: pending              Anticipated d/c date is: > 3 days              Patient currently is not medically stable to d/c.   Difficult to place patient No       Consultants:   none  Procedures:  LE [place] Summary:  RIGHT:  - There is no evidence of deep vein thrombosis in the lower extremity.    - No cystic structure found in the popliteal fossa.    LEFT:  - There is no evidence of deep vein thrombosis in the lower extremity.    - No cystic structure found in the popliteal fossa.   Echo IMPRESSIONS    1. Left ventricular ejection fraction, by estimation, is 55 to 60%. The  left ventricle has normal function. The left ventricle has no regional  wall motion abnormalities. There is moderate left ventricular hypertrophy.  Left ventricular diastolic  parameters are consistent with Grade I diastolic dysfunction (impaired  relaxation).  2. Right ventricular systolic function is moderately reduced. The right  ventricular size is moderately enlarged. There is moderately elevated  pulmonary artery systolic pressure.  3. Right atrial size was mildly dilated.  4. The mitral valve is abnormal. Trivial mitral valve regurgitation. No  evidence of mitral stenosis.  5. The aortic valve is tricuspid. Aortic valve regurgitation is not  visualized. No aortic stenosis is present.  6. The inferior vena cava is normal in size with <50% respiratory  variability, suggesting right atrial pressure of 8 mmHg.   Antimicrobials:  Anti-infectives (From admission, onward)   Start     Dose/Rate Route Frequency Ordered Stop   03/13/20 1000  remdesivir 100 mg in sodium chloride  0.9 % 100 mL IVPB        100 mg 200 mL/hr over 30 Minutes Intravenous Daily 03/12/20 1711 03/16/20 1034   03/12/20 1715  remdesivir 100 mg in sodium chloride 0.9 % 100 mL IVPB        100 mg 200 mL/hr over 30 Minutes Intravenous Every 30 min 03/12/20 1711 03/12/20 2301         Subjective: Seems confused when I say it's noon - she doesn't say what time she thinks it is Wants me to call her son Lorelee Cover to d/c home (discuss importance of making sure her oxygen is improving)  Objective: Vitals:   03/18/20 2025 03/19/20 0435 03/19/20 0436 03/19/20 1035  BP: (!) 155/93 (!) 142/80  126/61  Pulse: 95 100  (!) 105  Resp: 18 20    Temp:  97.9 F (36.6 C)    TempSrc:      SpO2: 93% (!) 89%    Weight:   86.4 kg   Height:        Intake/Output Summary (Last 24 hours) at 03/19/2020 1322  Last data filed at 03/19/2020 0600 Gross per 24 hour  Intake --  Output 500 ml  Net -500 ml   Filed Weights   03/16/20 0500 03/17/20 0500 03/19/20 0436  Weight: 88.8 kg 87 kg 86.4 kg    Examination:  General: No acute distress. Cardiovascular: Heart sounds show Quint Chestnut regular rate, and rhythm.  Lungs: Clear to auscultation bilaterally Abdomen: Soft, nontender, nondistended Neurological: Alert, but seems confused. Moves all extremities 4 . Cranial nerves II through XII grossly intact. Skin: Warm and dry. No rashes or lesions. Extremities: No clubbing or cyanosis. No edema.    Data Reviewed: I have personally reviewed following labs and imaging studies  CBC: Recent Labs  Lab 03/15/20 0447 03/16/20 0423 03/17/20 0927 03/18/20 0539 03/19/20 0429  WBC 11.1* 10.5 17.1* 11.9* 12.6*  NEUTROABS 8.8* 8.1* 15.0* 9.4* 10.4*  HGB 12.1 12.3 13.5 13.2 11.7*  HCT 37.8 39.4 43.6 40.5 36.9  MCV 86.9 88.5 90.1 86.7 88.5  PLT 348 421* 411* 409* 465*    Basic Metabolic Panel: Recent Labs  Lab 03/13/20 0410 03/14/20 0437 03/15/20 0447 03/16/20 0423 03/17/20 0927 03/18/20 0539 03/18/20 1626  03/19/20 0429  NA 145   < > 144 142 143 145 142 140  K 3.1*   < > 3.8 4.1 4.2 5.2* 4.3 4.6  CL 103   < > 104 102 107 105 106 105  CO2 31   < > 29 28 23 24 24 26   GLUCOSE 155*   < > 122* 122* 118* 102* 132* 109*  BUN 14   < > 24* 23 22 23 23 23   CREATININE 0.76   < > 0.88 0.90 0.62 1.05* 0.89 0.76  CALCIUM 8.1*   < > 8.7* 9.0 9.0 9.1 9.0 8.8*  MG 2.1   < > 2.5* 2.4 2.2 2.5*  --  2.5*  PHOS 2.5  --   --   --   --  4.1  --  4.0   < > = values in this interval not displayed.    GFR: Estimated Creatinine Clearance: 67 mL/min (by C-G formula based on SCr of 0.76 mg/dL).  Liver Function Tests: Recent Labs  Lab 03/15/20 0447 03/16/20 0423 03/17/20 0927 03/18/20 0539 03/19/20 0429  AST 36 33 41 50* 27  ALT 22 21 29 30 27   ALKPHOS 65 69 64 60 54  BILITOT 0.5 0.6 0.7 1.2 0.6  PROT 6.3* 6.6 6.7 7.0 6.2*  ALBUMIN 2.7* 3.0* 3.0* 3.0* 2.8*    CBG: No results for input(s): GLUCAP in the last 168 hours.   Recent Results (from the past 240 hour(s))  Blood culture (routine x 2)     Status: None   Collection Time: 03/12/20  3:02 PM   Specimen: BLOOD  Result Value Ref Range Status   Specimen Description   Final    BLOOD LEFT ANTECUBITAL Performed at [facility], [address] [place], [place] [address]    Special Requests   Final    BOTTLES DRAWN AEROBIC AND ANAEROBIC Blood Culture adequate volume Performed at [facility], [address] [place], [place] [address]    Culture   Final    NO GROWTH 5 DAYS Performed at [facility], [place] [address], [place], [place] [address]    Report Status 03/17/2020 FINAL  Final  Blood culture (routine x 2)     Status: None   Collection Time: 03/12/20  3:02 PM   Specimen:  BLOOD  Result Value Ref Range Status   Specimen Description   Final    BLOOD RIGHT ANTECUBITAL Performed at [facility], [place] [address] [place], [place] [address]    Special Requests   Final    BOTTLES DRAWN AEROBIC ONLY Blood Culture  results may not be optimal due to an inadequate volume of blood received in culture bottles Performed at [facility], [address] [place], [place] [address]    Culture   Final    NO GROWTH 5 DAYS Performed at [facility], [place] [address] [place], [place] [address]    Report Status 03/17/2020 FINAL  Final  SARS Coronavirus 2 by RT PCR (hospital order, performed in [facility] hospital lab) Nasopharyngeal Nasopharyngeal Swab     Status: Abnormal   Collection Time: 03/12/20  3:03 PM   Specimen: Nasopharyngeal Swab  Result Value Ref Range Status   SARS Coronavirus 2 POSITIVE (Indie Nickerson) NEGATIVE Final    Comment: RESULT CALLED TO, READ BACK BY AND VERIFIED WITH: [place] [id number] [phone] PHILLIPS C (NOTE) SARS-CoV-2 target nucleic acids are DETECTED  SARS-CoV-2 RNA is generally detectable in upper respiratory specimens  during the acute phase of infection.  Positive results are indicative  of the presence of the identified virus, but do not rule out bacterial infection or co-infection with other pathogens not detected by the test.  Clinical correlation with patient history and  other diagnostic information is necessary to determine patient infection status.  The expected result is negative.  Fact Sheet for Patients:   StrictlyIdeas.no   Fact Sheet for Healthcare Providers:   BankingDealers.co.za    This test is not yet approved or cleared by the [place] FDA and  has been authorized for detection and/or diagnosis of SARS-CoV-2 by FDA under an Emergency Use Authorization (EUA).  This EUA will remain in effect (meaning this tes t can be used) for the duration of  the COVID-19 declaration under Section 564(b)(1) of the Act, 21 U.S.C. section 360-bbb-3(b)(1), unless the authorization is terminated or revoked sooner.  Performed at [facility], [address] [place], [place] [address]   Culture, blood  (routine x 2)     Status: None (Preliminary result)   Collection Time: 03/17/20  1:21 PM   Specimen: BLOOD LEFT HAND  Result Value Ref Range Status   Specimen Description   Final    BLOOD LEFT HAND Performed at [place] [address] [place], [place] [address]    Special Requests   Final    BOTTLES DRAWN AEROBIC ONLY Blood Culture results may not be optimal due to an inadequate volume of blood received in culture bottles Performed at [facility] [address] [place], [place] [address]    Culture   Final    NO GROWTH 2 DAYS Performed at [facility] [address], [place], [place] [address]    Report Status PENDING  Incomplete         Radiology Studies: No results found.      Scheduled Meds: . apixaban  10 mg Oral BID   Followed by  . [START ON 03/26/2020] apixaban  5 mg Oral BID  . vitamin C  500 mg Oral QHS  . aspirin  81 mg Oral Daily  . [START ON 03/20/2020] baricitinib  4 mg Oral Daily  . Ipratropium-Albuterol  1 puff Inhalation QID  . losartan  50 mg Oral Daily  . methylPREDNISolone (SOLU-MEDROL) injection  60 mg Intravenous Q12H  . metoprolol succinate  12.5 mg Oral Daily  . multivitamin with minerals  1 tablet Oral QHS  . pantoprazole  40 mg Oral BID  . polyethylene glycol  17 g Oral Daily  . potassium chloride SA  20 mEq Oral BID  . pseudoephedrine  120 mg Oral BID  . rosuvastatin  10 mg Oral Daily  . traZODone  50 mg Oral QHS  . zinc sulfate  220 mg Oral QHS   Continuous Infusions: . sodium chloride       LOS: 7 days    Time spent: over 30 min    Fayrene Helper, MD [facility]   To contact the attending provider between 7A-7P or the covering provider during after hours 7P-7A, please log into the web site [email or web address] and access using universal [place] password for that web site. If you do not have the password, please call the hospital operator.  03/19/2020, 1:22 PM

## 2020-03-20 DIAGNOSIS — J1282 Pneumonia due to coronavirus disease 2019: Secondary | ICD-10-CM | POA: Diagnosis not present

## 2020-03-20 DIAGNOSIS — U071 COVID-19: Secondary | ICD-10-CM | POA: Diagnosis not present

## 2020-03-20 LAB — COMPREHENSIVE METABOLIC PANEL
ALT: 25 U/L (ref 0–44)
AST: 22 U/L (ref 15–41)
Albumin: 2.8 g/dL — ABNORMAL LOW (ref 3.5–5.0)
Alkaline Phosphatase: 55 U/L (ref 38–126)
Anion gap: 8 (ref 5–15)
BUN: 22 mg/dL (ref 8–23)
CO2: 26 mmol/L (ref 22–32)
Calcium: 8.7 mg/dL — ABNORMAL LOW (ref 8.9–10.3)
Chloride: 104 mmol/L (ref 98–111)
Creatinine, Ser: 0.91 mg/dL (ref 0.44–1.00)
GFR, Estimated: >60 mL/min (ref >60)
Glucose, Bld: 83 mg/dL (ref 70–99)
Potassium: 4 mmol/L (ref 3.5–5.1)
Sodium: 138 mmol/L (ref 135–145)
Total Bilirubin: 0.8 mg/dL (ref 0.3–1.2)
Total Protein: 6.1 g/dL — ABNORMAL LOW (ref 6.5–8.1)

## 2020-03-20 LAB — D-DIMER, QUANTITATIVE: D-Dimer, Quant: 2.57 ug/mL-FEU — ABNORMAL HIGH (ref 0.00–0.50)

## 2020-03-20 LAB — CBC WITH DIFFERENTIAL/PLATELET
Abs Immature Granulocytes: 0.22 10*3/uL — ABNORMAL HIGH (ref 0.00–0.07)
Basophils Absolute: 0 10*3/uL (ref 0.0–0.1)
Basophils Relative: 0 %
Eosinophils Absolute: 0 10*3/uL (ref 0.0–0.5)
Eosinophils Relative: 0 %
HCT: 36.7 % (ref 36.0–46.0)
Hemoglobin: 11.7 g/dL — ABNORMAL LOW (ref 12.0–15.0)
Immature Granulocytes: 2 %
Lymphocytes Relative: 13 %
Lymphs Abs: 1.6 10*3/uL (ref 0.7–4.0)
MCH: 28.1 pg (ref 26.0–34.0)
MCHC: 31.9 g/dL (ref 30.0–36.0)
MCV: 88 fL (ref 80.0–100.0)
Monocytes Absolute: 0.6 10*3/uL (ref 0.1–1.0)
Monocytes Relative: 5 %
Neutro Abs: 10.5 10*3/uL — ABNORMAL HIGH (ref 1.7–7.7)
Neutrophils Relative %: 80 %
Platelets: 459 10*3/uL — ABNORMAL HIGH (ref 150–400)
RBC: 4.17 MIL/uL (ref 3.87–5.11)
RDW: 15.5 % (ref 11.5–15.5)
WBC: 13 10*3/uL — ABNORMAL HIGH (ref 4.0–10.5)
nRBC: 0 % (ref 0.0–0.2)

## 2020-03-20 LAB — FERRITIN: Ferritin: 177 ng/mL (ref 11–307)

## 2020-03-20 LAB — PHOSPHORUS: Phosphorus: 3.3 mg/dL (ref 2.5–4.6)

## 2020-03-20 LAB — C-REACTIVE PROTEIN: CRP: 0.7 mg/dL (ref <1.0)

## 2020-03-20 LAB — MAGNESIUM: Magnesium: 2.3 mg/dL (ref 1.7–2.4)

## 2020-03-20 NOTE — Progress Notes (Signed)
Physical Therapy Treatment Patient Details Name: Stephanie Parks MRN: [id number] DOB: [birth year] Today's Date: 03/20/2020    History of Present Illness 75 y.o. female with a history of HTN, HLD, hypokalemia, and triple-vaccinated against covid who presented to the ED 03/12/20 for some confusion for a few days, shortness of breath for a week, also reporting floaters intermittently for 2 weeks. CTA revealed acute pulmonary embolism for which lovenox was added.  Pt admitted for Acute hypoxemic respiratory failure due to covid-19 pneumonia and acute pulmonary embolism.    PT Comments    Pt ambulated 12' x 2 with RW and min assist to manage RW, SaO2 87% on room air walking, 91% on room air at rest. HR 123 with walking, 1-2/4 dyspnea. Instructed pt in seated LE exercises for strengthening. Pt is oriented to self and location, but not to current month. She requires increased time and manual/verbal cues to follow commands.    Follow Up Recommendations  Home health PT;Supervision for mobility/OOB     Equipment Recommendations  Rolling walker with 5" wheels    Recommendations for Other Services       Precautions / Restrictions Precautions Precautions: Fall Precaution Comments: monitor O2 Restrictions Weight Bearing Restrictions: No    Mobility  Bed Mobility               General bed mobility comments: up in recliner  Transfers Overall transfer level: Needs assistance Equipment used: Rolling walker (2 wheeled) Transfers: Sit to/from Stand Sit to Stand: Min assist         General transfer comment: VCs hand placement, min A to power up  Ambulation/Gait Ambulation/Gait assistance: Min assist Gait Distance (Feet): 24 Feet (12' x 2 with seated rest on toilet) Assistive device: Rolling walker (2 wheeled) Gait Pattern/deviations: Step-through pattern;Trunk flexed Gait velocity: decr   General Gait Details: Verbal/manual cues for positioning in RW, Min A to negotiate obstacles  with RW, no loss of balance, SaO2 87% on room air walking, HR 123, 2/4 dyspnea   Stairs             Wheelchair Mobility    Modified Rankin (Stroke Patients Only)       Balance Overall balance assessment: Needs assistance   Sitting balance-Leahy Scale: Good       Standing balance-Leahy Scale: Fair                              Cognition Arousal/Alertness: Awake/alert Behavior During Therapy: Flat affect Overall Cognitive Status: No family/caregiver present to determine baseline cognitive functioning                                 General Comments: minimal verbalization, able to state her birthdate and location, not oriented to current month (pt stated its August, which is her birth month)      Exercises General Exercises - Lower Extremity Ankle Circles/Pumps: AROM;Both;10 reps;Seated Long Arc Quad: AROM;Both;10 reps;Seated Hip Flexion/Marching: AROM;Both;10 reps;Seated    General Comments        Pertinent Vitals/Pain Pain Assessment: No/denies pain    Home Living                      Prior Function            PT Goals (current goals can now be found in the care plan section) Acute Rehab PT  Goals PT Goal Formulation: With patient Time For Goal Achievement: 03/30/20 Potential to Achieve Goals: Good Progress towards PT goals: Progressing toward goals    Frequency    Min 3X/week      PT Plan Current plan remains appropriate    Co-evaluation              AM-PAC PT "6 Clicks" Mobility   Outcome Measure  Help needed turning from your back to your side while in a flat bed without using bedrails?: A Little Help needed moving from lying on your back to sitting on the side of a flat bed without using bedrails?: A Little Help needed moving to and from a bed to a chair (including a wheelchair)?: A Little Help needed standing up from a chair using your arms (e.g., wheelchair or bedside chair)?: A Little Help  needed to walk in hospital room?: A Little Help needed climbing 3-5 steps with a railing? : A Lot 6 Click Score: 17    End of Session Equipment Utilized During Treatment: Gait belt Activity Tolerance: Patient tolerated treatment well Patient left: in chair;with call bell/phone within reach;with chair alarm set Nurse Communication: Mobility status PT Visit Diagnosis: Other abnormalities of gait and mobility (R26.89)     Time: 3220-2542 PT Time Calculation (min) (ACUTE ONLY): 26 min  Charges:  $Gait Training: 8-22 mins $Therapeutic Exercise: 8-22 mins                    Blondell Reveal Kistler PT 03/20/2020  Acute Rehabilitation Services Pager [phone] Office [phone]

## 2020-03-20 NOTE — Care Management Important Message (Signed)
Important Message  Patient Details IM Letter placed in Patient's door Caddy. Name: Stephanie Parks MRN: [id number] Date of Birth: Oct 16, 1945   Medicare Important Message Given:  Yes     Kerin Salen 03/20/2020, 1:38 PM

## 2020-03-20 NOTE — Progress Notes (Signed)
PROGRESS NOTE    Stephanie Parks  OZD:[id number] DOB: 02/19/1945 DOA: 03/12/2020 PCP: [name], MD   Chief Complaint  Patient presents with  . Altered Mental Status  . Shortness of Breath   Brief Narrative: Stephanie Parks a74 y.o.femalewith Stephanie Parks history of HTN, HLD, hypokalemia, and triple-vaccinated against covid who presented to the ED 1/23 for some confusion for Stephanie Parks few days, shortness of breath for Stephanie Parks week, also reporting floaters intermittently for 2 weeks. She underwent CTA chest on 1/21 as an outpatient per her PCP after d-dimer resulted positive as part of work up for shortness of breath/chest discomfort. This was suggestive of multifocal pneumonia. PCP was unable to reach patient with these results, despite many attempts. In triage the patient was hypoxic requiring 5L HFNC, SARS-CoV-2 PCR positive, CRP 9.2, PCT and WBC wnl, BNP 34, troponin 18. Remdesivir, steroids were given and admission requested. Due to progressive hypoxia to requiring 15L, baricitinib was added. CTA revealed acute pulmonary embolism for which lovenox was added.  Assessment & Plan:   Principal Problem:   Pneumonia due to COVID-19 virus Active Problems:   HTN (hypertension)   CAD (coronary artery disease)  Acute Metabolic Encephalopathy  Likely 2/2 covid and acute hospitalization Confusion is improving, though she again seems to think it's the afternoon this morning With leukocytosis will start infectious w/u -> blood cx - NG, UA - not concerning for UTI.  CXR with bilateral interstitial markings compatible with atypical infection or edema. Consider imaging if worsening/persistent or focal deficit Normal TSH, follow b12 (elevated), folate (wnl), ammonia wnl.   Delirium precautions Trazodone nightly to help with sleep schedule.  Tricyclic d/c'd.  Acute hypoxemic respiratory failure due to covid-19 pneumonia and acute pulmonary embolism:In pt vaccinated x3, +on 1/23 with typical CTA chest  findings dating to 1/21 and development of RUL PE on CTA 1/24. - On RA at rest, requiring 2 L with activity, will need home O2 - CT PE protocol with acute PE within proximal R upper lobar artery, COVID 19 pattern pneumonia - CXR 1/27 diffuse bilateral increased interstitial markings compatible with atpyical infection or edema - follow echocardiogram -> EF 55-60%, no RWMA, moderate LVH, diastolic dysfunction, RVSF moderately reduced - continue lovenox 1mg /kg q12h -> transition to eliquis - Remdesivir (1/23-1/27), steroid taper (1/23-present), baricitinib (1/23 - present) all indicated and being given.  - strict I/O, daily weights - prone as able, OOB, IS, flutter, therapy  COVID-19 Labs  Recent Labs    03/18/20 0539 03/19/20 0429 03/20/20 0406  DDIMER 8.36* 2.90* 2.57*  FERRITIN 194 169 177  CRP 1.3* 1.0* 0.7    Lab Results  Component Value Date   SARSCOV2NAA POSITIVE ([place]) 03/12/2020   [place] NEGATIVE 02/22/2019   Leukocytosis: improved, likely related to steroids, follow  AKI improved  Moderately Reduced RVSF  Moderately Elevated PASP Likely related to covid infection and/or PE Will need repeat echocardiogram to follow up after she's improved  AST elevation: Mild, likely due to covid.  - resolved  Hypokalemia  Hyperkalmia:  - Supplemented - hyperk resolved - Hold thiazide for now  HTN - continue metoprolol, losartan - currently holding thiazide  HLD - continue statin  CAD: Has been evaluated by Dr. Irish Lack as outpt. Troponin very mildly elevated with flat trend and no current chest pain or ischemic ECG changes (suggestive of LVH however) - No inpatient evaluation planned.   Allergic rhinitis:  - Restart home flonase  Obesity: Estimated body mass index is 32.65 kg/m  as calculated from the following:   Height as of this encounter: 5\' 5"  (1.651 m).   Weight as of this encounter: 89 kg.  DVT prophylaxis: lovenox therapeutic  Code Status: full   Family Communication: none at bedside - son Disposition:   Status is: Inpatient  Remains inpatient appropriate because:Inpatient level of care appropriate due to severity of illness   Dispo: The patient is from: Home              Anticipated d/c is to: pending              Anticipated d/c date is: > 3 days              Patient currently is not medically stable to d/c.   Difficult to place patient No       Consultants:   none  Procedures:  LE [place] Summary:  RIGHT:  - There is no evidence of deep vein thrombosis in the lower extremity.    - No cystic structure found in the popliteal fossa.    LEFT:  - There is no evidence of deep vein thrombosis in the lower extremity.    - No cystic structure found in the popliteal fossa.   Echo IMPRESSIONS    1. Left ventricular ejection fraction, by estimation, is 55 to 60%. The  left ventricle has normal function. The left ventricle has no regional  wall motion abnormalities. There is moderate left ventricular hypertrophy.  Left ventricular diastolic  parameters are consistent with Grade I diastolic dysfunction (impaired  relaxation).  2. Right ventricular systolic function is moderately reduced. The right  ventricular size is moderately enlarged. There is moderately elevated  pulmonary artery systolic pressure.  3. Right atrial size was mildly dilated.  4. The mitral valve is abnormal. Trivial mitral valve regurgitation. No  evidence of mitral stenosis.  5. The aortic valve is tricuspid. Aortic valve regurgitation is not  visualized. No aortic stenosis is present.  6. The inferior vena cava is normal in size with <50% respiratory  variability, suggesting right atrial pressure of 8 mmHg.   Antimicrobials:  Anti-infectives (From admission, onward)   Start     Dose/Rate Route Frequency Ordered Stop   03/13/20 1000  remdesivir 100 mg in sodium chloride 0.9 % 100 mL IVPB        100 mg 200 mL/hr over 30 Minutes  Intravenous Daily 03/12/20 1711 03/16/20 1034   03/12/20 1715  remdesivir 100 mg in sodium chloride 0.9 % 100 mL IVPB        100 mg 200 mL/hr over 30 Minutes Intravenous Every 30 min 03/12/20 1711 03/12/20 2301         Subjective: Thinks it's the afternoon Stephanie Parks&Ox2-3 Feeling better, asks me to speak to her son  Objective: Vitals:   03/20/20 0543 03/20/20 0951 03/20/20 1307 03/20/20 1355  BP: 130/63 128/66  109/71  Pulse: 100 90  98  Resp: 18   19  Temp: 97.9 F (36.6 C)   (!) 97.3 F (36.3 C)  TempSrc: Oral   Oral  SpO2: 95%  91% 95%  Weight:      Height:        Intake/Output Summary (Last 24 hours) at 03/20/2020 1821 Last data filed at 03/20/2020 1433 Gross per 24 hour  Intake 120 ml  Output 352 ml  Net -232 ml   Filed Weights   03/17/20 0500 03/19/20 0436 03/20/20 0500  Weight: 87 kg 86.4 kg 85.8 kg  Examination:  General: No acute distress. Cardiovascular: Heart sounds show Macalister Arnaud regular rate, and rhythm Lungs: Clear to auscultation bilaterally Abdomen: Soft, nontender, nondistended Neurological: Alert and oriented 3 - confused to the time of the day. Moves all extremities 4. Cranial nerves II through XII grossly intact. Skin: Warm and dry. No rashes or lesions. Extremities: No clubbing or cyanosis. No edema.   Data Reviewed: I have personally reviewed following labs and imaging studies  CBC: Recent Labs  Lab 03/16/20 0423 03/17/20 0927 03/18/20 0539 03/19/20 0429 03/20/20 0406  WBC 10.5 17.1* 11.9* 12.6* 13.0*  NEUTROABS 8.1* 15.0* 9.4* 10.4* 10.5*  HGB 12.3 13.5 13.2 11.7* 11.7*  HCT 39.4 43.6 40.5 36.9 36.7  MCV 88.5 90.1 86.7 88.5 88.0  PLT 421* 411* 409* 465* 459*    Basic Metabolic Panel: Recent Labs  Lab 03/16/20 0423 03/17/20 0927 03/18/20 0539 03/18/20 1626 03/19/20 0429 03/20/20 0406  NA 142 143 145 142 140 138  K 4.1 4.2 5.2* 4.3 4.6 4.0  CL 102 107 105 106 105 104  CO2 28 23 24 24 26 26   GLUCOSE 122* 118* 102* 132* 109* 83   BUN 23 22 23 23 23 22   CREATININE 0.90 0.62 1.05* 0.89 0.76 0.91  CALCIUM 9.0 9.0 9.1 9.0 8.8* 8.7*  MG 2.4 2.2 2.5*  --  2.5* 2.3  PHOS  --   --  4.1  --  4.0 3.3    GFR: Estimated Creatinine Clearance: 58.7 mL/min (by C-G formula based on SCr of 0.91 mg/dL).  Liver Function Tests: Recent Labs  Lab 03/16/20 0423 03/17/20 0927 03/18/20 0539 03/19/20 0429 03/20/20 0406  AST 33 41 50* 27 22  ALT 21 29 30 27 25   ALKPHOS 69 64 60 54 55  BILITOT 0.6 0.7 1.2 0.6 0.8  PROT 6.6 6.7 7.0 6.2* 6.1*  ALBUMIN 3.0* 3.0* 3.0* 2.8* 2.8*    CBG: No results for input(s): GLUCAP in the last 168 hours.   Recent Results (from the past 240 hour(s))  Blood culture (routine x 2)     Status: None   Collection Time: 03/12/20  3:02 PM   Specimen: BLOOD  Result Value Ref Range Status   Specimen Description   Final    BLOOD LEFT ANTECUBITAL Performed at [facility], [address], [place], [place] [address]    Special Requests   Final    BOTTLES DRAWN AEROBIC AND ANAEROBIC Blood Culture adequate volume Performed at [facility], [address] [place], [place] [address]    Culture   Final    NO GROWTH 5 DAYS Performed at [facility], [place] [address], [place], [place] [address]    Report Status 03/17/2020 FINAL  Final  Blood culture (routine x 2)     Status: None   Collection Time: 03/12/20  3:02 PM   Specimen: BLOOD  Result Value Ref Range Status   Specimen Description   Final    BLOOD RIGHT ANTECUBITAL Performed at [facility], [place] [address], [place], [place] [address]    Special Requests   Final    BOTTLES DRAWN AEROBIC ONLY Blood Culture results may not be optimal due to an inadequate volume of blood received in culture bottles Performed at [facility], [address] [place], [place] [address]    Culture   Final    NO GROWTH 5 DAYS Performed at [facility], [place],  [place] [address]    Report Status  03/17/2020 FINAL  Final  SARS Coronavirus 2 by RT PCR (hospital order, performed in [facility] hospital lab) Nasopharyngeal Nasopharyngeal Swab     Status: Abnormal   Collection Time: 03/12/20  3:03 PM   Specimen: Nasopharyngeal Swab  Result Value Ref Range Status   SARS Coronavirus 2 POSITIVE ([place]hy[place]an Stallbaumer) NEGATIVE Final    Comment: RESULT CALLED TO, READ BACK BY AND VERIFIED WITH: [place] [id number] PHILLIPS C (NOTE) SARS-CoV-2 target nucleic acids are DETECTED  SARS-CoV-2 RNA is generally detectable in upper respiratory specimens  during the acute phase of infection.  Positive results are indicative  of the presence of the identified virus, but do not rule out bacterial infection or co-infection with other pathogens not detected by the test.  Clinical correlation with patient history and  other diagnostic information is necessary to determine patient infection status.  The expected result is negative.  Fact Sheet for Patients:   StrictlyIdeas.no   Fact Sheet for Healthcare Providers:   BankingDealers.co.za    This test is not yet approved or cleared by the [place] FDA and  has been authorized for detection and/or diagnosis of SARS-CoV-2 by FDA under an Emergency Use Authorization (EUA).  This EUA will remain in effect (meaning this tes t can be used) for the duration of  the COVID-19 declaration under Section 564(b)(1) of the Act, 21 U.S.C. section 360-bbb-3(b)(1), unless the authorization is terminated or revoked sooner.  Performed at [facility], [address] [place], [place] [address]   Culture, blood (routine x 2)     Status: None (Preliminary result)   Collection Time: 03/17/20  1:21 PM   Specimen: BLOOD LEFT HAND  Result Value Ref Range Status   Specimen Description   Final    BLOOD LEFT HAND Performed at [facility] [address] [place], [place] [address]    Special Requests    Final    BOTTLES DRAWN AEROBIC ONLY Blood Culture results may not be optimal due to an inadequate volume of blood received in culture bottles Performed at [facility] [address], [place], [place] [address]    Culture   Final    NO GROWTH 3 DAYS Performed at [facility], [place] [address] [place], [place] [address]    Report Status PENDING  Incomplete         Radiology Studies: No results found.      Scheduled Meds: . apixaban  10 mg Oral BID   Followed by  . [START ON 03/26/2020] apixaban  5 mg Oral BID  . vitamin C  500 mg Oral QHS  . aspirin  81 mg Oral Daily  . baricitinib  4 mg Oral Daily  . Ipratropium-Albuterol  1 puff Inhalation QID  . losartan  50 mg Oral Daily  . metoprolol succinate  12.5 mg Oral Daily  . multivitamin with minerals  1 tablet Oral QHS  . pantoprazole  40 mg Oral BID  . polyethylene glycol  17 g Oral Daily  . potassium chloride SA  20 mEq Oral BID  . predniSONE  40 mg Oral Q breakfast   Followed by  . [START ON 03/22/2020] predniSONE  30 mg Oral Q breakfast   Followed by  . [START ON 03/24/2020] predniSONE  20 mg Oral Q breakfast   Followed by  . [START ON 03/26/2020] predniSONE  10 mg Oral Q breakfast  .  pseudoephedrine  120 mg Oral BID  . rosuvastatin  10 mg Oral Daily  . traZODone  50 mg Oral QHS  . zinc sulfate  220 mg Oral QHS   Continuous Infusions: . sodium chloride       LOS: 8 days    Time spent: over 30 min    Fayrene Helper, MD [facility]   To contact the attending provider between 7A-7P or the covering provider during after hours 7P-7A, please log into the web site [email or web address] and access using universal [place] password for that web site. If you do not have the password, please call the hospital operator.  03/20/2020, 6:21 PM

## 2020-03-20 NOTE — TOC Progression Note (Addendum)
Transition of Care [facility]) - Progression Note    Patient Details  Name: Stephanie Parks MRN: [id number] Date of Birth: 21-Sep-1945  Transition of Care [facility]) CM/SW Contact  Ross Ludwig, [place] Phone Number: 03/20/2020, 5:01 PM  Clinical Narrative:     CSW spoke to patient to see if she had a preference for oxygen equipment or home health agencies, she stated she did not.  CSW contacted [employer] to order oxygen and also order walker.  DME was delivered to patient's room by [facility] from [place].  CSW contacted Malachy Mood at [place] to see if they can provide home health, she said they can.  CSW to continue to follow patient's progress throughout discharge planning.   Expected Discharge Plan: [place] Barriers to Discharge: Continued Medical Work up  Expected Discharge Plan and Services Expected Discharge Plan: [place] Choice: Home Health                                     Representative spoke with at [place]: Mahala Menghini [phone]  [phone]  James,Felicia Granddaughter   [phone]      Documents on File   Social Determinants of Health (SDOH) Interventions    Readmission Risk Interventions No flowsheet data found.

## 2020-03-20 NOTE — Progress Notes (Signed)
SATURATION QUALIFICATIONS: (This note is used to comply with regulatory documentation for home oxygen)  Patient Saturations on Room Air at Rest = 89-94%  Patient Saturations on Room Air while Ambulating = 85%  Patient Saturations on 1 Liters of oxygen while Ambulating = 90%  Please briefly explain why patient needs home oxygen:

## 2020-03-20 NOTE — Progress Notes (Signed)
SATURATION QUALIFICATIONS: (This note is used to comply with regulatory documentation for home oxygen)  Patient Saturations on Room Air at Rest = 91%  Patient Saturations on Room Air while Ambulating = 87%   Philomena Doheny PT 03/20/2020  Acute Rehabilitation Services Pager [phone] Office [phone]

## 2020-03-21 DIAGNOSIS — U071 COVID-19: Secondary | ICD-10-CM | POA: Diagnosis not present

## 2020-03-21 DIAGNOSIS — J1282 Pneumonia due to coronavirus disease 2019: Secondary | ICD-10-CM | POA: Diagnosis not present

## 2020-03-21 LAB — CBC WITH DIFFERENTIAL/PLATELET
Abs Immature Granulocytes: 0.18 10*3/uL — ABNORMAL HIGH (ref 0.00–0.07)
Basophils Absolute: 0 10*3/uL (ref 0.0–0.1)
Basophils Relative: 0 %
Eosinophils Absolute: 0 10*3/uL (ref 0.0–0.5)
Eosinophils Relative: 0 %
HCT: 40.4 % (ref 36.0–46.0)
Hemoglobin: 12.7 g/dL (ref 12.0–15.0)
Immature Granulocytes: 1 %
Lymphocytes Relative: 14 %
Lymphs Abs: 1.8 10*3/uL (ref 0.7–4.0)
MCH: 28.3 pg (ref 26.0–34.0)
MCHC: 31.4 g/dL (ref 30.0–36.0)
MCV: 90.2 fL (ref 80.0–100.0)
Monocytes Absolute: 0.8 10*3/uL (ref 0.1–1.0)
Monocytes Relative: 6 %
Neutro Abs: 10 10*3/uL — ABNORMAL HIGH (ref 1.7–7.7)
Neutrophils Relative %: 79 %
Platelets: 412 10*3/uL — ABNORMAL HIGH (ref 150–400)
RBC: 4.48 MIL/uL (ref 3.87–5.11)
RDW: 15.3 % (ref 11.5–15.5)
WBC: 12.8 10*3/uL — ABNORMAL HIGH (ref 4.0–10.5)
nRBC: 0 % (ref 0.0–0.2)

## 2020-03-21 LAB — COMPREHENSIVE METABOLIC PANEL
ALT: 24 U/L (ref 0–44)
AST: 22 U/L (ref 15–41)
Albumin: 2.9 g/dL — ABNORMAL LOW (ref 3.5–5.0)
Alkaline Phosphatase: 57 U/L (ref 38–126)
Anion gap: 12 (ref 5–15)
BUN: 22 mg/dL (ref 8–23)
CO2: 24 mmol/L (ref 22–32)
Calcium: 8.9 mg/dL (ref 8.9–10.3)
Chloride: 104 mmol/L (ref 98–111)
Creatinine, Ser: 0.93 mg/dL (ref 0.44–1.00)
GFR, Estimated: >60 mL/min (ref >60)
Glucose, Bld: 80 mg/dL (ref 70–99)
Potassium: 4.1 mmol/L (ref 3.5–5.1)
Sodium: 140 mmol/L (ref 135–145)
Total Bilirubin: 1 mg/dL (ref 0.3–1.2)
Total Protein: 6.3 g/dL — ABNORMAL LOW (ref 6.5–8.1)

## 2020-03-21 LAB — D-DIMER, QUANTITATIVE: D-Dimer, Quant: 2.79 ug/mL-FEU — ABNORMAL HIGH (ref 0.00–0.50)

## 2020-03-21 LAB — PHOSPHORUS: Phosphorus: 3.3 mg/dL (ref 2.5–4.6)

## 2020-03-21 LAB — FERRITIN: Ferritin: 258 ng/mL (ref 11–307)

## 2020-03-21 LAB — C-REACTIVE PROTEIN: CRP: 1 mg/dL — ABNORMAL HIGH (ref <1.0)

## 2020-03-21 LAB — MAGNESIUM: Magnesium: 2.3 mg/dL (ref 1.7–2.4)

## 2020-03-21 MED ORDER — PREDNISONE 10 MG PO TABS: ORAL_TABLET | ORAL | 0 refills | Status: AC

## 2020-03-21 MED ORDER — APIXABAN (ELIQUIS) VTE STARTER PACK (10MG AND 5MG): ORAL_TABLET | ORAL | 0 refills | Status: DC

## 2020-03-21 NOTE — Discharge Summary (Signed)
Physician Discharge Summary  Stephanie Parks [id number] DOB: [birth year] DOA: 03/12/2020  PCP: Stephanie Mclean, MD  Admit date: 03/12/2020 Discharge date: 03/21/2020  Time spent: 40 minutes  Recommendations for Outpatient Follow-up:  1. Follow outpatient CBC/CMP 2. Follow oxygen need outpatient - discharged with 2 L with activity 3. Discharged with eliquis for PE, needs at least 3 months uninterrupted for provoked VTE - after 3 months, recommend discussion with PCP regarding risk/benefits of continuing therapy.  In setting of provoked VTE, if no risk factors at that time, would be reasonable to d/Parks anticoagulation after 3 months. 4. Follow encephalopathy outpatient - mild encephalopathy throughout hospitalization, improved, but persistent at discharge - continue to follow outpatient, consider further w/u, neurology follow up if persistent 5. Needs follow up echo in about 3 months for reduced RVSF and elevated PASP 6. Follow BP off HCTZ/triamterene  7. Quarantine per [employer] protocols (21 days from positive test - until 2/16) 8. Left hepatic lobe lesion - follow outpatient, likely benign per rads  Discharge Diagnoses:  Principal Problem:   Pneumonia due to COVID-19 virus Active Problems:   HTN (hypertension)   CAD (coronary artery disease)   Discharge Condition: stable  Diet recommendation: heart healthy  Filed Weights   03/17/20 0500 03/19/20 0436 03/20/20 0500  Weight: 87 kg 86.4 kg 85.8 kg    History of present illness:  Stephanie Buffalo Jonesis a75 y.o.femalewith Stephanie Parks history of HTN, HLD, hypokalemia, and triple-vaccinated against covid who presented to the ED 1/23 for some confusion for Stephanie Parks few days, shortness of breath for Stephanie Parks week, also reporting floaters intermittently for 2 weeks. She underwent CTA chest on 1/21 as an outpatient per her PCP after d-dimer resulted positive as part of work up for shortness of breath/chest discomfort. This was suggestive of multifocal pneumonia. PCP  was unable to reach patient with these results, despite many attempts. In triage the patient was hypoxic requiring 5L HFNC, SARS-CoV-2 PCR positive, CRP 9.2, PCT and WBC wnl, BNP 34, troponin 18. Remdesivir, steroids were given and admission requested. Due to progressive hypoxia to requiring 15L, baricitinib was added.CTA revealed acute pulmonary embolismfor which lovenox was added.  She's gradually improved with steroids, remdesivir, and baricitinib.  She's had persistent encephalopathy which has gradually improved during he hospitalization as well.  She's stable for discharge on 2/1 with supplemental oxygen with activity, steroid taper, and eliquis for VTE.  She remains mildly encephalopathic on discharge, though appears safe for discharge at this time with continued outpatient follow up.  Hospital Course:  Acute Metabolic Encephalopathy  Likely 2/2 covid and acute hospitalization Overall improving, Stephanie Parks&Ox2 this AM (knew hospital, but couldn't specify which) With leukocytosis will start infectious w/u -> blood cx - NGTD, UA - not concerning for UTI.  CXR with bilateral interstitial markings compatible with atypical infection or edema. Consider imaging if worsening/persistent or focal deficit Normal TSH, follow b12 (elevated), folate (wnl), ammonia wnl.   Delirium precautions Tricyclic d/Parks'd Follow at home in Stephanie Parks familiar environment Consider additional workup, neurology follow up if persistent.  Acute hypoxemic respiratory failure due to covid-19 pneumoniaand acute pulmonary embolism:In pt vaccinated x3, +on 1/23 with typical CTA chest findings dating to 1/21and development of RUL PE on CTA 1/24. - On RA at rest, requiring 1-2 L with activity, will need home O2 - CT PE protocol with acute PE within proximal R upper lobar artery, COVID 19 pattern pneumonia - CXR 1/27 diffuse bilateral increased interstitial markings compatible with atpyical infection or edema -  follow echocardiogram -> EF  55-60%, no RWMA, moderate LVH, diastolic dysfunction, RVSF moderately reduced -continue lovenox 1mg /kg q12h -> transition to eliquis - Remdesivir (1/23-1/27), steroid taper (1/23-present), baricitinib (1/23 - 2/1) all indicated and being given.  Will d/Parks with steroid taper.  - strict I/O, daily weights - prone as able, OOB, IS, flutter, therapy  COVID-19 Labs  Recent Labs    03/19/20 0429 03/20/20 0406  DDIMER 2.90* 2.57*  FERRITIN 169 177  CRP 1.0* 0.7    Lab Results  Component Value Date   SARSCOV2NAA POSITIVE (Stephanie Parks) 03/12/2020   Stephanie Parks NEGATIVE 02/22/2019   Acute Pulmonary Embolism 2/2 COVID  Provoked by covid.  Will need at least 3 months uninterrupted anticoagulation.  Discharged on eliquis.  After 3 months will need to discuss with PCP regarding risks/benefits of continuing beyond 3 months, in setting of provoked PE, would be reasonable to discontinue at this time.  Leukocytosis: improved, likely related to steroids, follow  AKI improved  Moderately Reduced RVSF  Moderately Elevated PASP Likely related to covid infection and/or PE Will need repeat echocardiogram to follow up after she's improved  AST elevation: Mild, likely due to covid.  - resolved  Hypokalemia  Hyperkalmia:  - Supplemented - hyperk resolved  HTN - continue metoprolol, losartan - currently holding thiazide/triameterene at discharge as BP's reasonable  HLD - continue statin  CAD: Has been evaluated by Stephanie Parks as outpt. Troponin very mildly elevated with flat trend and no current chest pain or ischemic ECG changes (suggestive of LVH however) - No inpatient evaluation planned - d/Parks aspirin, eliquis started with PE (2020 cath with normal coronaries)  Allergic rhinitis:  - Restart home flonase  Obesity:Estimated body mass index is 32.65 kg/m as calculated from the following: Height as of this encounter: 5\' 5"  (1.651 m). Weight as of this encounter: 89  kg.  Procedures: Echo IMPRESSIONS    1. Left ventricular ejection fraction, by estimation, is 55 to 60%. The  left ventricle has normal function. The left ventricle has no regional  wall motion abnormalities. There is moderate left ventricular hypertrophy.  Left ventricular diastolic  parameters are consistent with Grade I diastolic dysfunction (impaired  relaxation).  2. Right ventricular systolic function is moderately reduced. The right  ventricular size is moderately enlarged. There is moderately elevated  pulmonary artery systolic pressure.  3. Right atrial size was mildly dilated.  4. The mitral valve is abnormal. Trivial mitral valve regurgitation. No  evidence of mitral stenosis.  5. The aortic valve is tricuspid. Aortic valve regurgitation is not  visualized. No aortic stenosis is present.  6. The inferior vena cava is normal in size with <50% respiratory  variability, suggesting right atrial pressure of 8 mmHg.  LE [place] Summary:  RIGHT:  - There is no evidence of deep vein thrombosis in the lower extremity.    - No cystic structure found in the popliteal fossa.    LEFT:  - There is no evidence of deep vein thrombosis in the lower extremity.    - No cystic structure found in the popliteal fossa.     *See table(s) above for measurements and observations.   Consultations:  none  Discharge Exam: Vitals:   03/20/20 2030 03/21/20 0559  BP: 93/64 110/63  Pulse: (!) 109 99  Resp: 19 19  Temp: 98.4 F (36.9 Parks) 97.8 F (36.6 Parks)  SpO2: 95% 93%   Seen in AM, just woke up - no CP or SOB Stephanie Parks&Ox2 (knew hospital, but  couldn't get more specific) No other complaints Called and discussed d/Parks plan withson  General: No acute distress. Cardiovascular: Heart sounds show Stephanie Parks regular rate, and rhythm Lungs: Clear to auscultation bilaterally Abdomen: Soft, nontender, nondistended  Neurological: Alert and oriented 2. Moves all extremities 4. Cranial nerves II  through XII grossly intact. Skin: Warm and dry. No rashes or lesions. Extremities: No clubbing or cyanosis. No edema.  Discharge Instructions   Discharge Instructions    Call MD for:  difficulty breathing, headache or visual disturbances   Complete by: As directed    Call MD for:  extreme fatigue   Complete by: As directed    Call MD for:  hives   Complete by: As directed    Call MD for:  persistant dizziness or light-headedness   Complete by: As directed    Call MD for:  persistant nausea and vomiting   Complete by: As directed    Call MD for:  redness, tenderness, or signs of infection (pain, swelling, redness, odor or green/yellow discharge around incision site)   Complete by: As directed    Call MD for:  severe uncontrolled pain   Complete by: As directed    Call MD for:  temperature >100.4   Complete by: As directed    Diet - low sodium heart healthy   Complete by: As directed    Discharge instructions   Complete by: As directed    You were seen for COVID 19 pneumonia.  You've improved with steroids, remdesivir, and baricitinib.  You will be discharged with Candus Braud steroid taper.  You'll discharge with oxygen to use with activity and at night for sleep.  You should continue oxygen until your PCP tells you it's ok to discontinue.  You should continue to isolate when you get home, you'll need to isolate until 04/05/20.  You can discontinue isolation after this date if you've continued to improve.  You were also diagnosed with blood clots in your lungs.  You've been started on Fines Kimberlin blood thinner (eliquis) for this.  You should continue Kendell Gammon blood thinner, uninterrupted, for at least 3 months.  Please discuss with your PCP after 3 months to discuss whether you need longer duration of treatment based on your risk factors.  We're sending you home with the starter pack.  You've already had 2 days worth of the high dose (10 mg twice daily regimen), your next dose at home will be the 5th dose in the  packet (you'll have 5 days left of the 10 mg twice daily regimen and then transition to 5 mg twice daily).  Please follow up with your PCP for refills.    You had an abnormal echo likely related to your COVID pneumonia and pulmonary embolism.  Please follow up Khalib Fendley repeat echo with your cardiologist in about 3 months.  We've stopped your triamterene/HCTZ because your blood pressures here have been ok off of this.   Follow up with your PCP.    We stopped your aspirin since we've started you on Sarit Sparano blood thinner.  Follow up with your cardiologist regarding this.  Return for new, recurrent, or worsening symptoms.  Please ask your PCP to request records from this hospitalization so they know what was done and what the next steps will be.   Increase activity slowly   Complete by: As directed      Allergies as of 03/21/2020      Reactions   Shellfish Allergy Hives, Swelling   Ace Inhibitors Other (  See Comments)   Pt cannot recall her reaction but tolerates arb    Gabapentin Other (See Comments)   headaches   Pitavastatin    Unknown reaction    Topiramate    Heart race,    Sulfa Antibiotics Other (See Comments), Rash   Fine bumps      Medication List    STOP taking these medications   aspirin 81 MG tablet   nortriptyline 25 MG capsule Commonly known as: PAMELOR   triamterene-hydrochlorothiazide 37.5-25 MG tablet Commonly known as: MAXZIDE-25     TAKE these medications   Apixaban Starter Pack (10mg  and 5mg ) Commonly known as: ELIQUIS STARTER PACK Take as directed on package: start with two-5mg  tablets twice daily for 7 days. On day 8, switch to one-5mg  tablet twice daily.   ascorbic acid 500 MG tablet Commonly known as: VITAMIN Parks Take 500 mg by mouth daily.   benzonatate 100 MG capsule Commonly known as: TESSALON Take 1 capsule (100 mg total) by mouth 3 (three) times daily as needed for cough.   CALCIUM 600 + D PO Take 1 tablet by mouth daily.   ELDERBERRY PO Take 50 mg by  mouth daily.   EPINEPHrine 0.3 mg/0.3 mL Soaj injection Commonly known as: EpiPen 2-Pak Inject 0.3 mLs (0.3 mg total) into the muscle as needed for anaphylaxis. Per allergen immunotherapy protocol   fluticasone 50 MCG/ACT nasal spray Commonly known as: FLONASE USE 2 SPRAYS IN NOSTRIL(S) DAILY AS NEEDED.  In the right nostril point the applicator out toward your right ear.  In the left nostril point the applicator out toward her left ear What changed:   how much to take  how to take this  when to take this  reasons to take this  additional instructions   losartan 50 MG tablet Commonly known as: COZAAR Take 1 tablet (50 mg total) by mouth daily.   metoprolol succinate 25 MG 24 hr tablet Commonly known as: TOPROL-XL Take 0.5 tablets (12.5 mg total) by mouth daily.   Multivital tablet Take 1 tablet by mouth daily.   pantoprazole 40 MG tablet Commonly known as: PROTONIX Take 1 tablet (40 mg total) by mouth 2 (two) times daily.   potassium chloride SA 20 MEQ tablet Commonly known as: KLOR-CON Take 2 pills (40 meq) once on day one, then take one daily as directed by MD   predniSONE 10 MG tablet Commonly known as: DELTASONE Take 3 tablets (30 mg total) by mouth daily for 2 days, THEN 2 tablets (20 mg total) daily for 2 days, THEN 1 tablet (10 mg total) daily for 2 days. Start taking on: March 21, 2020   PROBIOTIC PO Take 1 capsule by mouth daily.   rizatriptan 10 MG disintegrating tablet Commonly known as: MAXALT-MLT Take 10 mg by mouth as needed for migraine. May repeat in 2 hours if needed   rosuvastatin 10 MG tablet Commonly known as: CRESTOR TAKE 1 TABLET BY MOUTH EVERY DAY   TUBERCULIN SYR 1CC/27GX1/2" 27G X 1/2" 1 ML Misc Commonly known as: MONOJECT SYRINGE 1ML 27GX1/2" 10 each by Does not apply route once as needed for up to 1 dose.   zinc gluconate 50 MG tablet Take 50 mg by mouth daily.            Durable Medical Equipment  (From admission,  onward)         Start     Ordered   03/20/20 1443  For home use only DME Walker rolling  Once       Question Answer Comment  Walker: With [facility]   Patient needs Ariday Brinker walker to treat with the following condition Physical deconditioning      03/20/20 1442   03/20/20 1442  For home use only DME oxygen  Once       Comments: SATURATION QUALIFICATIONS: (This note is used to comply with regulatory documentation for home oxygen)  Patient Saturations on Room Air at Rest = 91%  Patient Saturations on Room Air while Ambulating = 87%  Question Answer Comment  Length of Need 6 Months   Mode or (Route) Nasal cannula   Liters per Minute 2   Frequency Continuous (stationary and portable oxygen unit needed)   Oxygen conserving device Yes   Oxygen delivery system Gas      03/20/20 1441         Allergies  Allergen Reactions  . Shellfish Allergy Hives and Swelling  . Ace Inhibitors Other (See Comments)    Pt cannot recall her reaction but tolerates arb   . Gabapentin Other (See Comments)    headaches  . Pitavastatin     Unknown reaction   . Topiramate     Heart race,   . Sulfa Antibiotics Other (See Comments) and Rash    Fine bumps      The results of significant diagnostics from this hospitalization (including imaging, microbiology, ancillary and laboratory) are listed below for reference.    Significant Diagnostic Studies: CT ANGIO CHEST PE W OR WO CONTRAST  Addendum Date: 03/13/2020   ADDENDUM REPORT: 03/13/2020 19:29 ADDENDUM: Critical Value/emergent results were called by telephone at the time of interpretation on 03/13/2020 at 7:29 pm to nurse practitioner Stephanie Parks , who verbally acknowledged these results. Electronically Signed   By: Ulyses Jarred M.D.   On: 03/13/2020 19:29   Result Date: 03/13/2020 CLINICAL DATA:  Shortness of breath, COVID-19, elevated D-dimer EXAM: CT ANGIOGRAPHY CHEST WITH CONTRAST TECHNIQUE: Multidetector CT imaging of the chest was performed  using the standard protocol during bolus administration of intravenous contrast. Multiplanar CT image reconstructions and MIPs were obtained to evaluate the vascular anatomy. CONTRAST:  144mL OMNIPAQUE IOHEXOL 350 MG/ML SOLN COMPARISON:  CTA chest 03/09/2020 FINDINGS: Cardiovascular: Contrast injection is sufficient to demonstrate satisfactory opacification of the pulmonary arteries to the segmental level. There is an acute pulmonary embolus within the proximal right upper lobar artery. There is no CT evidence of right heart strain. The size of the main pulmonary artery is normal. Heart size is normal, with no pericardial effusion. The course and caliber of the aorta are normal. There is atherosclerotic calcification. No acute aortic syndrome. Mediastinum/Nodes: Small hiatal hernia. Calcified subcarinal lymph nodes. Normal thyroid. Lungs/Pleura: Multifocal, peripheral predominant ground glass opacities. No pleural effusion. Upper Abdomen: Contrast bolus timing is not optimized for evaluation of the abdominal organs. The visualized portions of the organs of the upper abdomen are normal. Musculoskeletal: No chest wall abnormality. No bony spinal canal stenosis. Review of the MIP images confirms the above findings. IMPRESSION: 1. Acute pulmonary embolus within the proximal right upper lobar artery. No CT evidence of right heart strain. 2. COVID-19 pattern pneumonia. Aortic Atherosclerosis (ICD10-I70.0). Electronically Signed: By: Ulyses Jarred M.D. On: 03/13/2020 19:10   CT Angio Chest W/Cm &/Or Wo Cm  Addendum Date: 03/10/2020   ADDENDUM REPORT: 03/10/2020 17:05 ADDENDUM: The original report was by Dr. Van Clines. The following addendum is by Dr. Van Clines: These results were called by telephone at  the time of interpretation on 03/10/2020 at 4:57 Pm to provider JESSICA COPLAND , who verbally acknowledged these results. Electronically Signed   By: Van Clines M.D.   On: 03/10/2020 17:05    Result Date: 03/10/2020 CLINICAL DATA:  Elevated D-dimer level. Shortness of breath and chest pain. EXAM: CT ANGIOGRAPHY CHEST WITH CONTRAST TECHNIQUE: Multidetector CT imaging of the chest was performed using the standard protocol during bolus administration of intravenous contrast. Multiplanar CT image reconstructions and MIPs were obtained to evaluate the vascular anatomy. CONTRAST:  11mL OMNIPAQUE IOHEXOL 350 MG/ML SOLN COMPARISON:  [facility] chest [facility]/09/[facility]. FINDINGS: Cardiovascular: Suboptimal contrast timing in the systemic arterial phase. This can reduce sensitivity for small emboli, but overall the exam is deemed adequate. No filling defect is identified in the pulmonary arterial tree to suggest pulmonary embolus. Mild cardiomegaly. Atherosclerotic calcification of the aortic arch and branch vessels. Mediastinum/Nodes: Calcified mediastinal and right hilar lymph nodes compatible with old granulomatous disease. Lungs/Pleura: Bilateral peripheral ground-glass opacities, with some bandlike subpleural densities noted in the lower lobes. Although not entirely specific, this is Christabel Camire pattern often seen in COVID pneumonia. Other types of atypical pneumonia might be Damarea Merkel differential diagnostic consideration. These findings were not present on 12/09/2019. Small calcified bilateral lung nodules compatible with granulomas. Upper Abdomen: Hypodense lesion in the lateral segment left hepatic lobe as shown on 12/09/2019, nonspecific but probably Starlyn Droge cyst or similar benign lesion. Musculoskeletal: Thoracic spondylosis. Review of the MIP images confirms the above findings. IMPRESSION: 1. No filling defect is identified in the pulmonary arterial tree to suggest pulmonary embolus. 2. Bilateral peripheral ground-glass opacities, with some bandlike subpleural densities in the lower lobes. Although not entirely specific, this is Daiel Strohecker pattern often seen in COVID pneumonia. Other types of atypical  pneumonia or resolving edema might be Joscelyne Renville differential diagnostic consideration. 3. Mild cardiomegaly. 4. Old granulomatous disease. 5. Hypodense lesion in the lateral segment left hepatic lobe, nonspecific but probably Nigel Ericsson cyst or similar benign lesion. 6. Aortic atherosclerosis. Aortic Atherosclerosis (ICD10-I70.0). Radiology assistant personnel have been notified to put me in telephone contact with the referring physician or the referring physician's clinical representative in order to discuss these findings. Once this communication is established I will issue an addendum to this report for documentation purposes. Electronically Signed: By: Van Clines M.D. On: 03/10/2020 16:51   DG CHEST PORT 1 VIEW  Result Date: 03/16/2020 CLINICAL DATA:  Shortness of breath.  Pneumonia due to COVID-19. EXAM: PORTABLE CHEST 1 VIEW COMPARISON:  03/12/2020 FINDINGS: Heart size normal. Tortuous thoracic aorta. Bilateral scratch set diffuse increase interstitial markings noted bilaterally. No focal lobar consolidation or signs of pleural effusion. Visualized osseous structures are intact. IMPRESSION: 1. Diffuse bilateral increase interstitial markings compatible with atypical infection or edema. Electronically Signed   By: Kerby Moors M.D.   On: 03/16/2020 11:28   DG Chest Portable 1 View  Result Date: 03/12/2020 CLINICAL DATA:  Shortness of breath. EXAM: PORTABLE CHEST 1 VIEW COMPARISON:  December 06, 2019 FINDINGS: The mediastinal contour and cardiac silhouette are normal. Diffuse increased pulmonary interstitium is identified bilaterally. There is no focal pneumonia or pleural effusion. The bony structures are normal. IMPRESSION: Mild interstitial edema. Electronically Signed   By: Abelardo Diesel M.D.   On: 03/12/2020 15:49   ECHOCARDIOGRAM COMPLETE  Result Date: 03/15/2020    ECHOCARDIOGRAM REPORT   Patient Name:   [name] Date of Exam: 03/15/2020 Medical Rec #:  [id number]  Height:       65.0 in  Accession #:    [id number]       Weight:       196.2 lb Date of Birth:  May 15, 1945        BSA:          1.962 m Patient Age:    74 years         BP:           140/67 mmHg Patient Gender: F                HR:           86 bpm. Exam Location:  Inpatient Procedure: 2D Echo, 3D Echo, Cardiac Doppler and Color Doppler Indications:    I26.02 Pulmonary embolus  History:        Patient has prior history of Echocardiogram examinations, most                 recent 04/16/2011. CAD, Signs/Symptoms:Dizziness/Lightheadedness                 and Chest Pain; Risk Factors:Hypertension and Dyslipidemia.                 Covid positive.  Sonographer:    Sheralyn Boatman RDCS Referring Phys: [phone] Dream [employer] CALDWELL POWELL JR  Sonographer Comments: Technically difficult study due to poor echo windows, suboptimal apical window and suboptimal subcostal window. IMPRESSIONS  1. Left ventricular ejection fraction, by estimation, is 55 to 60%. The left ventricle has normal function. The left ventricle has no regional wall motion abnormalities. There is moderate left ventricular hypertrophy. Left ventricular diastolic parameters are consistent with Grade I diastolic dysfunction (impaired relaxation).  2. Right ventricular systolic function is moderately reduced. The right ventricular size is moderately enlarged. There is moderately elevated pulmonary artery systolic pressure.  3. Right atrial size was mildly dilated.  4. The mitral valve is abnormal. Trivial mitral valve regurgitation. No evidence of mitral stenosis.  5. The aortic valve is tricuspid. Aortic valve regurgitation is not visualized. No aortic stenosis is present.  6. The inferior vena cava is normal in size with <50% respiratory variability, suggesting right atrial pressure of 8 mmHg. FINDINGS  Left Ventricle: Left ventricular ejection fraction, by estimation, is 55 to 60%. The left ventricle has normal function. The left ventricle has no regional wall motion abnormalities. The left ventricular  internal cavity size was normal in size. There is  moderate left ventricular hypertrophy. Left ventricular diastolic parameters are consistent with Grade I diastolic dysfunction (impaired relaxation). Right Ventricle: The right ventricular size is moderately enlarged. Right vetricular wall thickness was not assessed. Right ventricular systolic function is moderately reduced. There is moderately elevated pulmonary artery systolic pressure. The tricuspid regurgitant velocity is 2.86 m/s, and with an assumed right atrial pressure of 15 mmHg, the estimated right ventricular systolic pressure is 47.7 mmHg. Left Atrium: Left atrial size was normal in size. Right Atrium: Right atrial size was mildly dilated. Pericardium: There is no evidence of pericardial effusion. Mitral Valve: The mitral valve is abnormal. There is mild thickening of the mitral valve leaflet(s). There is mild calcification of the mitral valve leaflet(s). Mild mitral annular calcification. Trivial mitral valve regurgitation. No evidence of mitral valve stenosis. Tricuspid Valve: The tricuspid valve is normal in structure. Tricuspid valve regurgitation is mild . No evidence of tricuspid stenosis. Aortic Valve: The aortic valve is tricuspid. Aortic valve regurgitation is not visualized. No aortic stenosis is present. Pulmonic  Valve: The pulmonic valve was normal in structure. Pulmonic valve regurgitation is mild. No evidence of pulmonic stenosis. Aorta: The aortic root is normal in size and structure. Venous: The inferior vena cava is normal in size with less than 50% respiratory variability, suggesting right atrial pressure of 8 mmHg. IAS/Shunts: No atrial level shunt detected by color flow Doppler.  LEFT VENTRICLE PLAX 2D LVIDd:         3.60 cm     Diastology LVIDs:         2.50 cm     LV e' medial:    10.10 cm/s LV PW:         1.60 cm     LV E/e' medial:  7.2 LV IVS:        1.50 cm     LV e' lateral:   8.39 cm/s LVOT diam:     2.00 cm     LV E/e'  lateral: 8.7 LV SV:         62 LV SV Index:   32 LVOT Area:     3.14 cm  LV Volumes (MOD) LV vol d, MOD A2C: 72.5 ml LV vol d, MOD A4C: 95.9 ml LV vol s, MOD A2C: 23.5 ml LV vol s, MOD A4C: 40.7 ml LV SV MOD A2C:     49.0 ml LV SV MOD A4C:     95.9 ml LV SV MOD BP:      51.2 ml RIGHT VENTRICLE             IVC RV S prime:     15.10 cm/s  IVC diam: 1.70 cm TAPSE (M-mode): 2.0 cm LEFT ATRIUM             Index       RIGHT ATRIUM           Index LA diam:        2.80 cm 1.43 cm/m  RA Area:     12.30 cm LA Vol (A2C):   19.0 ml 9.68 ml/m  RA Volume:   25.60 ml  13.05 ml/m LA Vol (A4C):   23.1 ml 11.77 ml/m LA Biplane Vol: 20.6 ml 10.50 ml/m  AORTIC VALVE LVOT Vmax:   106.00 cm/s LVOT Vmean:  65.000 cm/s LVOT VTI:    0.198 m  AORTA Ao Root diam: 3.40 cm Ao Asc diam:  3.65 cm MITRAL VALVE                TRICUSPID VALVE MV Area (PHT): 5.38 cm     TR Peak grad:   32.7 mmHg MV Decel Time: 141 msec     TR Vmax:        286.00 cm/s MV E velocity: 73.10 cm/s MV Virginie Josten velocity: 124.00 cm/s  SHUNTS MV E/Zyhir Cappella ratio:  0.59         Systemic VTI:  0.20 m                             Systemic Diam: 2.00 cm Jenkins Rouge MD Electronically signed by Jenkins Rouge MD Signature Date/Time: 03/15/2020/4:32:12 PM    Final    VAS [place] LOWER EXTREMITY VENOUS (DVT)  Result Date: 03/13/2020  Lower Venous DVT Study Indications: Elevated Ddimer.  Risk Factors: COVID 19 positive. Limitations: Poor ultrasound/tissue interface. Comparison Study: No prior studies. Performing Technologist: Oliver Hum RVT  Examination Guidelines: Cordia Miklos complete evaluation includes B-mode imaging, spectral Doppler, color Doppler, and power  Doppler as needed of all accessible portions of each vessel. Bilateral testing is considered an integral part of Derriona Branscom complete examination. Limited examinations for reoccurring indications may be performed as noted. The reflux portion of the exam is performed with the patient in reverse Trendelenburg.   +---------+---------------+---------+-----------+----------+--------------+ RIGHT    CompressibilityPhasicitySpontaneityPropertiesThrombus Aging +---------+---------------+---------+-----------+----------+--------------+ CFV      Full           Yes      Yes                                 +---------+---------------+---------+-----------+----------+--------------+ SFJ      Full                                                        +---------+---------------+---------+-----------+----------+--------------+ FV Prox  Full                                                        +---------+---------------+---------+-----------+----------+--------------+ FV Mid   Full                                                        +---------+---------------+---------+-----------+----------+--------------+ FV DistalFull                                                        +---------+---------------+---------+-----------+----------+--------------+ PFV      Full                                                        +---------+---------------+---------+-----------+----------+--------------+ POP      Full           Yes      Yes                                 +---------+---------------+---------+-----------+----------+--------------+ PTV      Full                                                        +---------+---------------+---------+-----------+----------+--------------+ PERO     Full                                                        +---------+---------------+---------+-----------+----------+--------------+   +---------+---------------+---------+-----------+----------+--------------+  LEFT     CompressibilityPhasicitySpontaneityPropertiesThrombus Aging +---------+---------------+---------+-----------+----------+--------------+ CFV      Full           Yes      Yes                                  +---------+---------------+---------+-----------+----------+--------------+ SFJ      Full                                                        +---------+---------------+---------+-----------+----------+--------------+ FV Prox  Full                                                        +---------+---------------+---------+-----------+----------+--------------+ FV Mid   Full                                                        +---------+---------------+---------+-----------+----------+--------------+ FV DistalFull                                                        +---------+---------------+---------+-----------+----------+--------------+ PFV      Full                                                        +---------+---------------+---------+-----------+----------+--------------+ POP      Full           Yes      Yes                                 +---------+---------------+---------+-----------+----------+--------------+ PTV      Full                                                        +---------+---------------+---------+-----------+----------+--------------+ PERO     Full                                                        +---------+---------------+---------+-----------+----------+--------------+     Summary: RIGHT: - There is no evidence of deep vein thrombosis in the lower extremity.  - No cystic structure found in the popliteal fossa.  LEFT: - There is no evidence of deep vein thrombosis in the lower extremity.  - No   cystic structure found in the popliteal fossa.  *See table(s) above for measurements and observations. Electronically signed by Ruta Hinds MD on 03/13/2020 at 6:20:06 PM.    Final     Microbiology: Recent Results (from the past 240 hour(s))  Blood culture (routine x 2)     Status: None   Collection Time: 03/12/20  3:02 PM   Specimen: BLOOD  Result Value Ref Range Status   Specimen Description   Final    BLOOD  LEFT ANTECUBITAL Performed at [facility], [address] [place], [place] [address]    Special Requests   Final    BOTTLES DRAWN AEROBIC AND ANAEROBIC Blood Culture adequate volume Performed at [facility], [address] [place], [place] [address]    Culture   Final    NO GROWTH 5 DAYS Performed at [facility], [place] [address], [place], [place] [address]    Report Status 03/17/2020 FINAL  Final  Blood culture (routine x 2)     Status: None   Collection Time: 03/12/20  3:02 PM   Specimen: BLOOD  Result Value Ref Range Status   Specimen Description   Final    BLOOD RIGHT ANTECUBITAL Performed at [facility], [place] [address], [place], [place] [address]    Special Requests   Final    BOTTLES DRAWN AEROBIC ONLY Blood Culture results may not be optimal due to an inadequate volume of blood received in culture bottles Performed at [facility], [address] [place], [place] [address]    Culture   Final    NO GROWTH 5 DAYS Performed at [facility], [place] [address], [place], [place] [address]    Report Status 03/17/2020 FINAL  Final  SARS Coronavirus 2 by RT PCR (hospital order, performed in [facility] hospital lab) Nasopharyngeal Nasopharyngeal Swab     Status: Abnormal   Collection Time: 03/12/20  3:03 PM   Specimen: Nasopharyngeal Swab  Result Value Ref Range Status   SARS Coronavirus 2 POSITIVE (Natisha Trzcinski) NEGATIVE Final    Comment: RESULT CALLED TO, READ BACK BY AND VERIFIED WITH: Stephanie Parks [phone] Stephanie Parks (NOTE) SARS-CoV-2 target nucleic acids are DETECTED  SARS-CoV-2 RNA is generally detectable in upper respiratory specimens  during the acute phase of infection.  Positive results are indicative  of the presence of the identified virus, but do not rule out bacterial infection or co-infection with other pathogens not detected by the test.  Clinical correlation with patient history and  other diagnostic information is  necessary to determine patient infection status.  The expected result is negative.  Fact Sheet for Patients:   StrictlyIdeas.no   Fact Sheet for Healthcare Providers:   BankingDealers.co.za    This test is not yet approved or cleared by the [place] FDA and  has been authorized for detection and/or diagnosis of SARS-CoV-2 by FDA under an Emergency Use Authorization (EUA).  This EUA will remain in effect (meaning this tes t can be used) for the duration of  the COVID-19 declaration under Section 564(b)(1) of the Act, [place] section 360-bbb-3(b)(1), unless the authorization is terminated or revoked sooner.  Performed at [facility], [address] [place], [place] [address]   Culture, blood (routine x 2)     Status: None (Preliminary result)   Collection Time: 03/17/20  1:21 PM   Specimen: BLOOD LEFT HAND  Result Value Ref  Range Status   Specimen Description   Final    BLOOD LEFT HAND Performed at [place] [address], [place], [place] [address]    Special Requests   Final    BOTTLES DRAWN AEROBIC ONLY Blood Culture results may not be optimal due to an inadequate volume of blood received in culture bottles Performed at [facility] [address] [place], [place] [address]    Culture   Final    NO GROWTH 3 DAYS Performed at [facility], [place] [address], [place], [place] [address]    Report Status PENDING  Incomplete     Labs: Basic Metabolic Panel: Recent Labs  Lab 03/16/20 0423 03/17/20 0927 03/18/20 0539 03/18/20 1626 03/19/20 0429 03/20/20 0406  NA 142 143 145 142 140 138  K 4.1 4.2 5.2* 4.3 4.6 4.0  CL 102 107 105 106 105 104  CO2 28 23 24 24 26 26   GLUCOSE 122* 118* 102* 132* 109* 83  BUN 23 22 23 23 23 22   CREATININE 0.90 0.62 1.05* 0.89 0.76 0.91  CALCIUM 9.0 9.0 9.1 9.0 8.8* 8.7*  MG 2.4 2.2 2.5*  --  2.5* 2.3  PHOS  --   --  4.1  --   4.0 3.3   Liver Function Tests: Recent Labs  Lab 03/16/20 0423 03/17/20 0927 03/18/20 0539 03/19/20 0429 03/20/20 0406  AST 33 41 50* 27 22  ALT 21 29 30 27 25   ALKPHOS 69 64 60 54 55  BILITOT 0.6 0.7 1.2 0.6 0.8  PROT 6.6 6.7 7.0 6.2* 6.1*  ALBUMIN 3.0* 3.0* 3.0* 2.8* 2.8*   No results for input(s): LIPASE, AMYLASE in the last 168 hours. Recent Labs  Lab 03/18/20 0539  AMMONIA 35   CBC: Recent Labs  Lab 03/16/20 0423 03/17/20 0927 03/18/20 0539 03/19/20 0429 03/20/20 0406  WBC 10.5 17.1* 11.9* 12.6* 13.0*  NEUTROABS 8.1* 15.0* 9.4* 10.4* 10.5*  HGB 12.3 13.5 13.2 11.7* 11.7*  HCT 39.4 43.6 40.5 36.9 36.7  MCV 88.5 90.1 86.7 88.5 88.0  PLT 421* 411* 409* 465* 459*   Cardiac Enzymes: No results for input(s): CKTOTAL, CKMB, CKMBINDEX, TROPONINI in the last 168 hours. BNP: BNP (last 3 results) Recent Labs    10/31/19 1502 03/12/20 1503 03/17/20 1321  BNP 111.4* 34.6 75.1    ProBNP (last 3 results) Recent Labs    07/30/19 1614  PROBNP 99    CBG: No results for input(s): GLUCAP in the last 168 hours.     Signed:  Fayrene Helper MD.  [facility] 03/21/2020, 8:59 AM

## 2020-03-21 NOTE — Plan of Care (Signed)
  Problem: Respiratory: Goal: Will maintain a patent airway Outcome: Progressing Goal: Complications related to the disease process, condition or treatment will be avoided or minimized Outcome: Progressing   Problem: Safety: Goal: Ability to remain free from injury will improve Outcome: Progressing   Problem: Activity: Goal: Risk for activity intolerance will decrease Outcome: Progressing

## 2020-03-21 NOTE — Progress Notes (Signed)
Discharge instructions explained to patient's son- Stephanie Parks over the phone and discharge packet given to patient when he picked up the patient and he verbalized understanding, Patient denies any pain/distress, no wound or pressure injury noted. Patient accompanied home by son

## 2020-03-21 NOTE — TOC Transition Note (Signed)
Transition of Care [facility]) - CM/SW Discharge Note   Patient Details  Name: Stephanie Parks MRN: [id number] Date of Birth: 1945/05/17  Transition of Care [facility]) CM/SW Contact:  Ross Ludwig, LCSW Phone Number: 03/21/2020, 10:56 AM   Clinical Narrative:     Patient will be going home with home health through [facility].  CSW signing off please reconsult with any other social work needs, home health agency has been notified of planned discharge.  Patient had oxygen and a rolling walker delivered yesterday.  Patient stated she did not have any other equipment needs.   Final next level of care: [employer] Barriers to Discharge: Barriers Resolved   Patient Goals and CMS Choice Patient states their goals for this hospitalization and ongoing recovery are:: To return back home with home health. CMS Medicare.gov Compare Post Acute Care list provided to:: Patient Choice offered to / list presented to : Patient  Discharge Placement                       Discharge Plan and Services     Post Acute Care Choice: Home Health          DME Arranged: Oxygen,Walker rolling DME Agency: Other - Comment [facility]) Date DME Agency Contacted: 03/20/20 Time DME Agency Contacted: 21 Representative spoke with at DME Agency: [facility] HH Arranged: [place]: [place] Date [place]: 03/20/20 Time [place]: 1200 Representative spoke with at [place]: [place] Determinants of Health ([place]) Interventions     Readmission Risk Interventions No flowsheet data found.

## 2020-03-22 ENCOUNTER — Other Ambulatory Visit: Payer: Self-pay

## 2020-03-22 ENCOUNTER — Telehealth: Payer: Self-pay

## 2020-03-22 ENCOUNTER — Other Ambulatory Visit: Payer: Self-pay | Admitting: *Deleted

## 2020-03-22 DIAGNOSIS — U071 COVID-19: Secondary | ICD-10-CM | POA: Diagnosis not present

## 2020-03-22 LAB — CULTURE, BLOOD (ROUTINE X 2): Culture: NO GROWTH

## 2020-03-22 NOTE — Patient Outreach (Signed)
[facility]) Care Management  03/22/2020  Stephanie Parks [birth year] [id number]   Telephone outreach for [name] follow up.  Unsuccessful, left message and requested a return call.  [name]. [name], MSN, [facility] Gerontological Nurse Practitioner [facility] Care Management [phone]

## 2020-03-22 NOTE — Telephone Encounter (Signed)
Transition Care Management Unsuccessful Follow-up Telephone Call  Date of discharge and from where:  03/21/2020-[place]  Attempts:  1st Attempt  Reason for unsuccessful TCM follow-up call:  No answer/busy

## 2020-03-23 ENCOUNTER — Other Ambulatory Visit: Payer: Self-pay | Admitting: *Deleted

## 2020-03-23 DIAGNOSIS — I671 Cerebral aneurysm, nonruptured: Secondary | ICD-10-CM | POA: Diagnosis not present

## 2020-03-23 DIAGNOSIS — G43909 Migraine, unspecified, not intractable, without status migrainosus: Secondary | ICD-10-CM | POA: Diagnosis not present

## 2020-03-23 DIAGNOSIS — R7309 Other abnormal glucose: Secondary | ICD-10-CM | POA: Diagnosis not present

## 2020-03-23 DIAGNOSIS — K222 Esophageal obstruction: Secondary | ICD-10-CM | POA: Diagnosis not present

## 2020-03-23 DIAGNOSIS — D649 Anemia, unspecified: Secondary | ICD-10-CM | POA: Diagnosis not present

## 2020-03-23 DIAGNOSIS — J302 Other seasonal allergic rhinitis: Secondary | ICD-10-CM | POA: Diagnosis not present

## 2020-03-23 DIAGNOSIS — K5900 Constipation, unspecified: Secondary | ICD-10-CM | POA: Diagnosis not present

## 2020-03-23 DIAGNOSIS — Z7952 Long term (current) use of systemic steroids: Secondary | ICD-10-CM | POA: Diagnosis not present

## 2020-03-23 DIAGNOSIS — K5790 Diverticulosis of intestine, part unspecified, without perforation or abscess without bleeding: Secondary | ICD-10-CM | POA: Diagnosis not present

## 2020-03-23 DIAGNOSIS — Z7951 Long term (current) use of inhaled steroids: Secondary | ICD-10-CM | POA: Diagnosis not present

## 2020-03-23 DIAGNOSIS — G9341 Metabolic encephalopathy: Secondary | ICD-10-CM | POA: Diagnosis not present

## 2020-03-23 DIAGNOSIS — I10 Essential (primary) hypertension: Secondary | ICD-10-CM | POA: Diagnosis not present

## 2020-03-23 DIAGNOSIS — Z7901 Long term (current) use of anticoagulants: Secondary | ICD-10-CM | POA: Diagnosis not present

## 2020-03-23 DIAGNOSIS — M1712 Unilateral primary osteoarthritis, left knee: Secondary | ICD-10-CM | POA: Diagnosis not present

## 2020-03-23 DIAGNOSIS — J1282 Pneumonia due to coronavirus disease 2019: Secondary | ICD-10-CM | POA: Diagnosis not present

## 2020-03-23 DIAGNOSIS — J9601 Acute respiratory failure with hypoxia: Secondary | ICD-10-CM | POA: Diagnosis not present

## 2020-03-23 DIAGNOSIS — U071 COVID-19: Secondary | ICD-10-CM | POA: Diagnosis not present

## 2020-03-23 DIAGNOSIS — I2510 Atherosclerotic heart disease of native coronary artery without angina pectoris: Secondary | ICD-10-CM | POA: Diagnosis not present

## 2020-03-23 DIAGNOSIS — H43393 Other vitreous opacities, bilateral: Secondary | ICD-10-CM | POA: Diagnosis not present

## 2020-03-23 DIAGNOSIS — K219 Gastro-esophageal reflux disease without esophagitis: Secondary | ICD-10-CM | POA: Diagnosis not present

## 2020-03-23 DIAGNOSIS — H6991 Unspecified Eustachian tube disorder, right ear: Secondary | ICD-10-CM | POA: Diagnosis not present

## 2020-03-23 DIAGNOSIS — M7661 Achilles tendinitis, right leg: Secondary | ICD-10-CM | POA: Diagnosis not present

## 2020-03-23 DIAGNOSIS — E785 Hyperlipidemia, unspecified: Secondary | ICD-10-CM | POA: Diagnosis not present

## 2020-03-23 DIAGNOSIS — I2699 Other pulmonary embolism without acute cor pulmonale: Secondary | ICD-10-CM | POA: Diagnosis not present

## 2020-03-23 NOTE — Telephone Encounter (Signed)
Transition Care Management Unsuccessful Follow-up Telephone Call  Date of discharge and from where:  03/21/2020-[place]  Attempts:  3rd Attempt  Reason for unsuccessful TCM follow-up call:  No answer/busy Attempted call at home & mobile number.

## 2020-03-23 NOTE — Telephone Encounter (Signed)
Transition Care Management Unsuccessful Follow-up Telephone Call  Date of discharge and from where:  03/21/2020-[place]  Attempts:  2nd Attempt  Reason for unsuccessful TCM follow-up call:  Left voice message

## 2020-03-23 NOTE — Patient Outreach (Signed)
[facility]) Care Management  03/23/2020  Stephanie Parks [birth year] [id number]  Telephone outreach to follow up PAC, Stephanie Parks had COVID complicated with PE.  Stephanie Parks is home and reports she is getting better. She says she was sicker than she has ever been. She now is on O2 and has been taking a prednisone taper and Eliquis 10 mg for 7 days then 5 mg bid ongoing. She does not have a follow up appt yet but intends on calling Dr. Arlyn Dunning office tomorrow. She is to be under quarantine until 04/15/20.  Goals Addressed            This Visit's Progress   . Learn More About My Health as evidenced by Stephanie Parks being able to repeat instructions over the next month.   On track    Follow Up Date  04/18/20  - make a list of questions - ask questions - repeat what I heard to make sure I understand - speak up when I don't understand    Why is this important?   The best way to learn about your health and care is by talking to the doctor and nurse.  They will answer your questions and give you information in the way that you like best.    Notes: Stephanie Parks always has questions. Have encouraged her to write her questions down when they occur to her and they she can present this list to her provider when she is in the office. Her MD also communicates by my chart. 03/23/20 Stephanie Parks is always learning new ways to manage her health. Recently she was diagnosed with COVID and had the complication of a PE. She is now on O2 which is new and taking Eliquis for the PE. She has been given education on both of these problems throughout her hospitalization but will need reinforcement to wear her O2 and to take her medication exactly the way it was ordered. Advised to write her questions down so she can remember when she is with her MD.    . Manage My Emotions as evidenced by decreased unneccessary ED visits that could be addressed at primary care over the next 3 months.   On track    Follow Up Date 04/18/20  Why is  this important?   When you are stressed, down or upset, your body reacts too.  For example, your blood pressure may get higher; you may have a headache or stomachache.  When your emotions get the best of you, your body's ability to fight off cold and flu gets weak.  These steps will help you manage your emotions.     Notes: This is an issue that Stephanie Parks struggles with. She is easily excited and anxious with any change from what she perceives is her "normal." Most incidents are not urgent and she needs counseling about for example BP and Pulse normals to be reassured. This will be an ongoing need for her to be reassured. 03/23/20 Stephanie Parks did have 2 ED visits within 2 weeks of establishing this goal but none in Nov and Dec. She had a warranted hospitalization in Jan for [place]. She will call NP for advice and expresses appreciation for our conversations and assistance in understanding her health problems.    . COMPLETED: Track and Manage My Blood Pressure       Follow Up Date 04/18/20    Why is this important?   You won't feel high blood pressure, but it can still hurt your  blood vessels.  High blood pressure can cause heart or kidney problems. It can also cause a stroke.  Making lifestyle changes like losing a little weight or eating less salt will help.  Checking your blood pressure at home and at different times of the day can help to control blood pressure.  If the doctor prescribes medicine remember to take it the way the doctor ordered.  Call the office if you cannot afford the medicine or if there are questions about it.     Notes: Stephanie Parks does monitor her BP, encouraged to continue this monitoring regimen. 01/21/20 Stephanie Parks does monitor her pressure and pulse. She has been educated on what is high and normal ranges. She eats healthy, takes her medications and is active.      We agreed we will talk again next Thursday.  Stephanie Parks. Stephanie Neither, MSN, [facility] Gerontological Nurse Practitioner [facility]  Care Management [phone]

## 2020-03-24 ENCOUNTER — Other Ambulatory Visit: Payer: Self-pay | Admitting: *Deleted

## 2020-03-24 NOTE — Patient Outreach (Signed)
[facility]) Care Management  03/24/2020  Stephanie Parks Jan 10, 1946 [id number]  Telephone outreach for [employer] on [place] Call: No follow up appt and doesn't have all meds.  Called pt without success, left message. When I spoke to Mrs. Samet yesterday she reported she had all her meds and that she was going to call her primary care MD today to schedule an appt.  We have a follow up call scheduled for next week and pt knows she can call me with any problem.  [name]. [name], MSN, [facility] Gerontological Nurse Practitioner [facility] Care Management [phone]

## 2020-03-28 DIAGNOSIS — Z7951 Long term (current) use of inhaled steroids: Secondary | ICD-10-CM | POA: Diagnosis not present

## 2020-03-28 DIAGNOSIS — I10 Essential (primary) hypertension: Secondary | ICD-10-CM | POA: Diagnosis not present

## 2020-03-28 DIAGNOSIS — H43393 Other vitreous opacities, bilateral: Secondary | ICD-10-CM | POA: Diagnosis not present

## 2020-03-28 DIAGNOSIS — J1282 Pneumonia due to coronavirus disease 2019: Secondary | ICD-10-CM | POA: Diagnosis not present

## 2020-03-28 DIAGNOSIS — G43909 Migraine, unspecified, not intractable, without status migrainosus: Secondary | ICD-10-CM | POA: Diagnosis not present

## 2020-03-28 DIAGNOSIS — I2510 Atherosclerotic heart disease of native coronary artery without angina pectoris: Secondary | ICD-10-CM | POA: Diagnosis not present

## 2020-03-28 DIAGNOSIS — K222 Esophageal obstruction: Secondary | ICD-10-CM | POA: Diagnosis not present

## 2020-03-28 DIAGNOSIS — H6991 Unspecified Eustachian tube disorder, right ear: Secondary | ICD-10-CM | POA: Diagnosis not present

## 2020-03-28 DIAGNOSIS — M1712 Unilateral primary osteoarthritis, left knee: Secondary | ICD-10-CM | POA: Diagnosis not present

## 2020-03-28 DIAGNOSIS — K219 Gastro-esophageal reflux disease without esophagitis: Secondary | ICD-10-CM | POA: Diagnosis not present

## 2020-03-28 DIAGNOSIS — J302 Other seasonal allergic rhinitis: Secondary | ICD-10-CM | POA: Diagnosis not present

## 2020-03-28 DIAGNOSIS — R7309 Other abnormal glucose: Secondary | ICD-10-CM | POA: Diagnosis not present

## 2020-03-28 DIAGNOSIS — M7661 Achilles tendinitis, right leg: Secondary | ICD-10-CM | POA: Diagnosis not present

## 2020-03-28 DIAGNOSIS — U071 COVID-19: Secondary | ICD-10-CM | POA: Diagnosis not present

## 2020-03-28 DIAGNOSIS — Z7952 Long term (current) use of systemic steroids: Secondary | ICD-10-CM | POA: Diagnosis not present

## 2020-03-28 DIAGNOSIS — G9341 Metabolic encephalopathy: Secondary | ICD-10-CM | POA: Diagnosis not present

## 2020-03-28 DIAGNOSIS — J9601 Acute respiratory failure with hypoxia: Secondary | ICD-10-CM | POA: Diagnosis not present

## 2020-03-28 DIAGNOSIS — I2699 Other pulmonary embolism without acute cor pulmonale: Secondary | ICD-10-CM | POA: Diagnosis not present

## 2020-03-28 DIAGNOSIS — E785 Hyperlipidemia, unspecified: Secondary | ICD-10-CM | POA: Diagnosis not present

## 2020-03-28 DIAGNOSIS — I671 Cerebral aneurysm, nonruptured: Secondary | ICD-10-CM | POA: Diagnosis not present

## 2020-03-28 DIAGNOSIS — K5790 Diverticulosis of intestine, part unspecified, without perforation or abscess without bleeding: Secondary | ICD-10-CM | POA: Diagnosis not present

## 2020-03-28 DIAGNOSIS — K5900 Constipation, unspecified: Secondary | ICD-10-CM | POA: Diagnosis not present

## 2020-03-28 DIAGNOSIS — Z7901 Long term (current) use of anticoagulants: Secondary | ICD-10-CM | POA: Diagnosis not present

## 2020-03-28 DIAGNOSIS — D649 Anemia, unspecified: Secondary | ICD-10-CM | POA: Diagnosis not present

## 2020-03-28 NOTE — Progress Notes (Signed)
[place] at [facility] [address] [place], [place] [address] [phone]  Date:  03/29/2020   Name:  Stephanie Parks   DOB:  1945-09-06   MRN:  [id number]  PCP:  [name], MD    Chief Complaint: No chief complaint on file.   History of Present Illness:  Stephanie Parks is a 75 y.o. very pleasant female patient who presents with the following:  Virtual visit today for hospital follow-up-she was recently hospitalized for several days with COVID-19 related pneumonia, pulmonary embolism and now oxygen dependence Patient location is home, provider location is at office.  Connected with patient via telephone, the patient and myself are present on the call today  Admit date: 03/12/2020 Discharge date: 03/21/2020  Recommendations for Outpatient Follow-up:  1. Follow outpatient CBC/CMP 2. Follow oxygen need outpatient - discharged with 2 L with activity 3. Discharged with eliquis for PE, needs at least 3 months uninterrupted for provoked VTE - after 3 months, recommend discussion with PCP regarding risk/benefits of continuing therapy.  In setting of provoked VTE, if no risk factors at that time, would be reasonable to d/c anticoagulation after 3 months. 4. Follow encephalopathy outpatient - mild encephalopathy throughout hospitalization, improved, but persistent at discharge - continue to follow outpatient, consider further w/u, neurology follow up if persistent 5. Needs follow up echo in about 3 months for reduced RVSF and elevated PASP 6. Follow BP off HCTZ/triamterene  7. Quarantine per [employer] protocols (21 days from positive test - until 2/16) 8. Left hepatic lobe lesion - follow outpatient, likely benign per rads  Discharge Diagnoses:  Principal Problem:   Pneumonia due to COVID-19 virus Active Problems:   HTN (hypertension)   CAD (coronary artery disease)  History of present illness:  Stephanie Parks a75  y.o.femalewith a history of HTN, HLD, hypokalemia, and triple-vaccinated against covid who presented to the ED 1/23 for some confusion for a few days, shortness of breath for a week, also reporting floaters intermittently for 2 weeks. She underwent CTA chest on 1/21 as an outpatient per her PCP after d-dimer resulted positive as part of work up for shortness of breath/chest discomfort. This was suggestive of multifocal pneumonia. PCP was unable to reach patient with these results, despite many attempts. In triage the patient was hypoxic requiring 5L HFNC, SARS-CoV-2 PCR positive, CRP 9.2, PCT and WBC wnl, BNP 34, troponin 18. Remdesivir, steroids were given and admission requested. Due to progressive hypoxia to requiring 15L, baricitinib was added.CTA revealed acute pulmonary embolismfor which lovenox was added.  She's gradually improved with steroids, remdesivir, and baricitinib.  She's had persistent encephalopathy which has gradually improved during he hospitalization as well.  She's stable for discharge on 2/1 with supplemental oxygen with activity, steroid taper, and eliquis for VTE.  She remains mildly encephalopathic on discharge, though appears safe for discharge at this time with continued outpatient follow up.  Hospital Course:  Acute Metabolic Encephalopathy  Likely 2/2 covid and acute hospitalization Overall improving, A&Ox2 this AM (knew hospital, but couldn't specify which) With leukocytosis will start infectious w/u ->blood cx - NGTD, UA - not concerning for UTI. CXR with bilateral interstitial markings compatible with atypical infection or edema. Consider imaging if worsening/persistent or focal deficit Normal TSH, follow b12 (elevated), folate (wnl), ammonia wnl.  Delirium precautions Tricyclic d/c'd Follow at home in a familiar environment Consider additional workup, neurology follow up if persistent.  Acute hypoxemic respiratory  failure due to covid-19 pneumoniaand acute  pulmonary embolism:In pt vaccinated x3, +on 1/23 with typical CTA chest findings dating to 1/21and development of RUL PE on CTA 1/24. -On RA at rest, requiring 1-2 L with activity, will need home O2 - CT PE protocol with acute PE within proximal R upper lobar artery, COVID 19 pattern pneumonia - CXR 1/27 diffuse bilateral increased interstitial markings compatible with atpyical infection or edema - follow echocardiogram -> EF 55-60%, no RWMA, moderate LVH, diastolic dysfunction, RVSF moderately reduced -continue lovenox 1mg /kg q12h ->transition to eliquis - Remdesivir (1/23-1/27), steroid taper (1/23-present), baricitinib (1/23 - 2/1) all indicated and being given.  Will d/c with steroid taper.  - strict I/O, daily weights - prone as able, OOB, IS, flutter, therapy  COVID-19 Labs  Recent Labs (last 2 labs)       Recent Labs    03/19/20 0429 03/20/20 0406  DDIMER 2.90* 2.57*  FERRITIN 169 177  CRP 1.0* 0.7      Recent Labs       Lab Results  Component Value Date   SARSCOV2NAA POSITIVE (A) 03/12/2020   [place] NEGATIVE 02/22/2019     Acute Pulmonary Embolism 2/2 COVID  Provoked by covid.  Will need at least 3 months uninterrupted anticoagulation.  Discharged on eliquis.  After 3 months will need to discuss with PCP regarding risks/benefits of continuing beyond 3 months, in setting of provoked PE, would be reasonable to discontinue at this time. Leukocytosis: improved, likely related to steroids, follow AKI improved Moderately Reduced RVSF  Moderately Elevated PASP Likely related to covid infection and/or PE Will need repeat echocardiogram to follow up after she's improved AST elevation: Mild, likely due to covid.  - resolved Hypokalemia  Hyperkalmia:  - Supplemented - hyperk resolved HTN - continue metoprolol, losartan - currently holding thiazide/triameterene at discharge as BP's reasonable HLD - continue statin CAD: Has been evaluated by Dr. Irish Lack  as outpt. Troponin very mildly elevated with flat trend and no current chest pain or ischemic ECG changes (suggestive of LVH however) - No inpatient evaluation planned - d/c aspirin, eliquis started with PE (2020 cath with normal coronaries) Allergic rhinitis:  - Restart home flonase Obesity:Estimated body mass index is 32.65 kg/m as calculated from the following: Height as of this encounter: 5\' 5"  (1.651 m). Weight as of this encounter: 89 kg.  Procedures: Echo IMPRESSIONS  1. Left ventricular ejection fraction, by estimation, is 55 to 60%. The  left ventricle has normal function. The left ventricle has no regional  wall motion abnormalities. There is moderate left ventricular hypertrophy.  Left ventricular diastolic  parameters are consistent with Grade I diastolic dysfunction (impaired  relaxation).   Pt reports she is now feeling somewhat better although it has been a hard road She is using oxygen at home - she is using 4 liters.   She is using this 24/7 right now She notes that PT is coming out twice a week to work with her  Weight 186 Wt Readings from Last 3 Encounters:  03/20/20 189 lb 2.5 oz (85.8 kg)  03/08/20 202 lb (91.6 kg)  02/03/20 210 lb (95.3 kg)   BP was 117/55 this am, pulse in the 80s per her report She notes that her breathing is ok as long as she does not move quickly and uses her oxygen No fever noted since she got home  She is not using her diuretic, is taking losartan at this time  Patient Active Problem List  Diagnosis Date Noted  . Pneumonia due to COVID-19 virus 03/12/2020  . Seasonal allergic conjunctivitis 10/04/2019  . Dysfunction of right eustachian tube 10/04/2019  . Pre-diabetes 06/02/2019  . Seasonal and perennial allergic rhinitis 12/22/2018  . Heart palpitations 12/22/2018  . History of chest pain 07/20/2018  . Brain aneurysm 07/20/2018  . Bronchitis 04/23/2018  . Tendonitis, Achilles, right 12/08/2017  . Onychomycosis  12/08/2017  . Unilateral primary osteoarthritis, left knee 10/07/2017  . Ingrown toenail 08/10/2017  . Coughing 04/24/2017  . CAD (coronary artery disease) 03/01/2015  . Aneurysm ([place]) 03/01/2015  . Dizziness 03/01/2015  . Paresthesia of arm 03/01/2015  . Allergic rhinitis 03/01/2015  . Achalasia 09/28/2012  . CN (constipation) 09/28/2012  . Cephalalgia 08/24/2012  . HTN (hypertension) 04/19/2011  . Hyperlipidemia 04/19/2011  . GERD (gastroesophageal reflux disease) 04/19/2011  . Chest pain 04/04/2011    Past Medical History:  Diagnosis Date  . 3-vessel CAD 03/01/2015  . Achalasia 09/28/2012  . Allergic rhinitis 03/01/2015  . Anemia   . Aneurysm ([place]) 03/01/2015  . BP (high blood pressure) 03/01/2015  . Bronchitis 04/23/2018  . Cephalalgia 08/24/2012   Overview:  ICD-10 cut over    . Chest pain 04/04/2011  . CN (constipation) 09/28/2012  . Coughing 04/24/2017  . Decreased potassium in the blood 03/01/2015  . Diverticulosis   . Dizziness 03/01/2015  . GERD (gastroesophageal reflux disease)   . Hiatal hernia   . Hypercholesterolemia 03/01/2015  . Hyperlipidemia   . Hypertension   . Ingrown toenail 08/10/2017  . Inguinal hernia   . Migraine headache   . Onychomycosis 12/08/2017  . Paresthesia of arm 03/01/2015  . Schatzki's ring   . Tendonitis, Achilles, right 12/08/2017  . Unilateral primary osteoarthritis, left knee 10/07/2017    Past Surgical History:  Procedure Laterality Date  . ABDOMINAL HYSTERECTOMY    . BACK SURGERY     x 3  . CARPAL TUNNEL RELEASE     left wrist  . CERVICAL SPINE SURGERY    . HEMORRHOID SURGERY      Social History   Tobacco Use  . Smoking status: Former Smoker    Packs/day: 0.50    Years: 20.00    Pack years: 10.00    Types: Cigarettes    Quit date: 02/19/1984    Years since quitting: 36.1  . Smokeless tobacco: Never Used  Vaping Use  . Vaping Use: Never used  Substance Use Topics  . Alcohol use: No  . Drug use: No    Family History   Problem Relation Age of Onset  . Allergic rhinitis Sister   . Diabetes Sister   . Hypertension Sister   . Heart attack Father 51  . Heart disease Father   . Stomach cancer Maternal Grandmother   . Heart disease Mother   . Colon cancer Neg Hx   . Angioedema Neg Hx   . Asthma Neg Hx   . Eczema Neg Hx   . Urticaria Neg Hx   . Immunodeficiency Neg Hx   . Breast cancer Neg Hx   . Migraines Neg Hx   . Headache Neg Hx     Allergies  Allergen Reactions  . Shellfish Allergy Hives and Swelling  . Ace Inhibitors Other (See Comments)    Pt cannot recall her reaction but tolerates arb   . Gabapentin Other (See Comments)    headaches  . Pitavastatin     Unknown reaction   . Topiramate     Heart race,   .  Sulfa Antibiotics Other (See Comments) and Rash    Fine bumps    Medication list has been reviewed and updated.  Current Outpatient Medications on File Prior to Visit  Medication Sig Dispense Refill  . APIXABAN (ELIQUIS) VTE STARTER PACK (10MG  AND 5MG ) Take as directed on package: start with two-5mg  tablets twice daily for 7 days. On day 8, switch to one-5mg  tablet twice daily. 1 each 0  . ascorbic acid (VITAMIN C) 500 MG tablet Take 500 mg by mouth daily.    . benzonatate (TESSALON) 100 MG capsule Take 1 capsule (100 mg total) by mouth 3 (three) times daily as needed for cough. 20 capsule 0  . Calcium Carb-Cholecalciferol (CALCIUM 600 + D PO) Take 1 tablet by mouth daily.    Marland Kitchen ELDERBERRY PO Take 50 mg by mouth daily.    Marland Kitchen EPINEPHrine (EPIPEN 2-PAK) 0.3 mg/0.3 mL IJ SOAJ injection Inject 0.3 mLs (0.3 mg total) into the muscle as needed for anaphylaxis. Per allergen immunotherapy protocol 1 each 2  . fluticasone (FLONASE) 50 MCG/ACT nasal spray USE 2 SPRAYS IN NOSTRIL(S) DAILY AS NEEDED.  In the right nostril point the applicator out toward your right ear.  In the left nostril point the applicator out toward her left ear (Patient taking differently: Place 2 sprays into both nostrils  daily as needed for allergies.) 16 g 9  . losartan (COZAAR) 50 MG tablet Take 1 tablet (50 mg total) by mouth daily. 90 tablet 3  . metoprolol succinate (TOPROL-XL) 25 MG 24 hr tablet Take 0.5 tablets (12.5 mg total) by mouth daily. 90 tablet 1  . Multiple Vitamins-Minerals (MULTIVITAL) tablet Take 1 tablet by mouth daily.     . pantoprazole (PROTONIX) 40 MG tablet Take 1 tablet (40 mg total) by mouth 2 (two) times daily. 180 tablet 1  . potassium chloride SA (KLOR-CON) 20 MEQ tablet Take 2 pills (40 meq) once on day one, then take one daily as directed by MD 30 tablet 0  . Probiotic Product (PROBIOTIC PO) Take 1 capsule by mouth daily.    . rizatriptan (MAXALT-MLT) 10 MG disintegrating tablet Take 10 mg by mouth as needed for migraine. May repeat in 2 hours if needed    . rosuvastatin (CRESTOR) 10 MG tablet TAKE 1 TABLET BY MOUTH EVERY DAY (Patient taking differently: Take 10 mg by mouth daily.) 90 tablet 3  . TUBERCULIN SYR 1CC/27GX1/2" (MONOJECT SYRINGE 1ML 27GX1/2") 27G X 1/2" 1 ML MISC 10 each by Does not apply route once as needed for up to 1 dose. 10 each 0  . zinc gluconate 50 MG tablet Take 50 mg by mouth daily.     No current facility-administered medications on file prior to visit.    Review of Systems:  As per HPI- otherwise negative.   Physical Examination: There were no vitals filed for this visit. There were no vitals filed for this visit. There is no height or weight on file to calculate BMI. Ideal Body Weight:    Spoke with pt over telephone-she sounds her normal self.  No shortness of breath or distress is noted  Assessment and Plan: Hospital discharge follow-up  COVID-19  Other acute pulmonary embolism, unspecified whether acute cor pulmonale present [facility])  Telephone visit only today- pt not able to get video to work Spoke to pt for 17 minutes today  As above, we discussed her recent hospitalization and diagnosis of pulmonary embolism, she is taking her Eliquis  as directed.  I would like  to see her in person, we made an appoint for this coming Monday.  I have asked her to let me know if any changes or worsening in the meantime, otherwise we will see her Monday morning for in person follow-up Continue to use oxygen as needed, continue Eliquis for the time being  Signed Lamar Blinks, MD

## 2020-03-29 ENCOUNTER — Other Ambulatory Visit: Payer: Self-pay

## 2020-03-29 ENCOUNTER — Telehealth (INDEPENDENT_AMBULATORY_CARE_PROVIDER_SITE_OTHER): Payer: Medicare Other | Admitting: Family Medicine

## 2020-03-29 VITALS — BP 117/55 | Wt 186.0 lb

## 2020-03-29 DIAGNOSIS — Z09 Encounter for follow-up examination after completed treatment for conditions other than malignant neoplasm: Secondary | ICD-10-CM

## 2020-03-29 DIAGNOSIS — U071 COVID-19: Secondary | ICD-10-CM | POA: Diagnosis not present

## 2020-03-29 DIAGNOSIS — I2699 Other pulmonary embolism without acute cor pulmonale: Secondary | ICD-10-CM | POA: Diagnosis not present

## 2020-03-30 ENCOUNTER — Other Ambulatory Visit: Payer: Self-pay | Admitting: *Deleted

## 2020-03-30 ENCOUNTER — Other Ambulatory Visit: Payer: Self-pay | Admitting: Family Medicine

## 2020-03-30 DIAGNOSIS — I2699 Other pulmonary embolism without acute cor pulmonale: Secondary | ICD-10-CM | POA: Diagnosis not present

## 2020-03-30 DIAGNOSIS — M1712 Unilateral primary osteoarthritis, left knee: Secondary | ICD-10-CM | POA: Diagnosis not present

## 2020-03-30 DIAGNOSIS — G43909 Migraine, unspecified, not intractable, without status migrainosus: Secondary | ICD-10-CM | POA: Diagnosis not present

## 2020-03-30 DIAGNOSIS — Z7901 Long term (current) use of anticoagulants: Secondary | ICD-10-CM | POA: Diagnosis not present

## 2020-03-30 DIAGNOSIS — I10 Essential (primary) hypertension: Secondary | ICD-10-CM | POA: Diagnosis not present

## 2020-03-30 DIAGNOSIS — D649 Anemia, unspecified: Secondary | ICD-10-CM | POA: Diagnosis not present

## 2020-03-30 DIAGNOSIS — I671 Cerebral aneurysm, nonruptured: Secondary | ICD-10-CM | POA: Diagnosis not present

## 2020-03-30 DIAGNOSIS — I2510 Atherosclerotic heart disease of native coronary artery without angina pectoris: Secondary | ICD-10-CM | POA: Diagnosis not present

## 2020-03-30 DIAGNOSIS — U071 COVID-19: Secondary | ICD-10-CM | POA: Diagnosis not present

## 2020-03-30 DIAGNOSIS — R7309 Other abnormal glucose: Secondary | ICD-10-CM | POA: Diagnosis not present

## 2020-03-30 DIAGNOSIS — G9341 Metabolic encephalopathy: Secondary | ICD-10-CM | POA: Diagnosis not present

## 2020-03-30 DIAGNOSIS — Z7951 Long term (current) use of inhaled steroids: Secondary | ICD-10-CM | POA: Diagnosis not present

## 2020-03-30 DIAGNOSIS — H6991 Unspecified Eustachian tube disorder, right ear: Secondary | ICD-10-CM | POA: Diagnosis not present

## 2020-03-30 DIAGNOSIS — J1282 Pneumonia due to coronavirus disease 2019: Secondary | ICD-10-CM | POA: Diagnosis not present

## 2020-03-30 DIAGNOSIS — K5900 Constipation, unspecified: Secondary | ICD-10-CM | POA: Diagnosis not present

## 2020-03-30 DIAGNOSIS — J9601 Acute respiratory failure with hypoxia: Secondary | ICD-10-CM | POA: Diagnosis not present

## 2020-03-30 DIAGNOSIS — J302 Other seasonal allergic rhinitis: Secondary | ICD-10-CM | POA: Diagnosis not present

## 2020-03-30 DIAGNOSIS — K219 Gastro-esophageal reflux disease without esophagitis: Secondary | ICD-10-CM | POA: Diagnosis not present

## 2020-03-30 DIAGNOSIS — M7661 Achilles tendinitis, right leg: Secondary | ICD-10-CM | POA: Diagnosis not present

## 2020-03-30 DIAGNOSIS — K222 Esophageal obstruction: Secondary | ICD-10-CM | POA: Diagnosis not present

## 2020-03-30 DIAGNOSIS — K5790 Diverticulosis of intestine, part unspecified, without perforation or abscess without bleeding: Secondary | ICD-10-CM | POA: Diagnosis not present

## 2020-03-30 DIAGNOSIS — E785 Hyperlipidemia, unspecified: Secondary | ICD-10-CM | POA: Diagnosis not present

## 2020-03-30 DIAGNOSIS — H43393 Other vitreous opacities, bilateral: Secondary | ICD-10-CM | POA: Diagnosis not present

## 2020-03-30 DIAGNOSIS — Z7952 Long term (current) use of systemic steroids: Secondary | ICD-10-CM | POA: Diagnosis not present

## 2020-03-30 NOTE — Patient Outreach (Signed)
[facility]) Care Management  03/30/2020  TAZIYAH IANNUZZI November 23, 1945 [id number]  Telephone outreach to follow up on [place] recovery.  Mrs. Whitehorn says she is doing better. She has an appt with her primary care next Monday. She has finished all her antibiotic and prednisone taper. She is continuing her Apixiban.  Goals Addressed            This Visit's Progress   . Learn More About My Health as evidenced by pt being able to repeat instructions over the next month.       Follow Up Date  04/18/20  - make a list of questions - ask questions - repeat what I heard to make sure I understand - speak up when I don't understand    Why is this important?   The best way to learn about your health and care is by talking to the doctor and nurse.  They will answer your questions and give you information in the way that you like best.    Notes: Ms. Matar always has questions. Have encouraged her to write her questions down when they occur to her and they she can present this list to her provider when she is in the office. Her MD also communicates by my chart. 03/23/20 Pt is always learning new ways to manage her health. Recently she was diagnosed with COVID and had the complication of a PE. She is now on O2 which is new and taking Eliquis for the PE. She has been given education on both of these problems throughout her hospitalization but will need reinforcement to wear her O2 and to take her medication exactly the way it was ordered. Advised to write her questions down so she can remember when she is with her MD. 03/30/20 Pt reports she will see her MD on Monday. Reinforced for her to make her question list for the appt and reminded her she can always call her nurse.    . COMPLETED: Manage My Emotions as evidenced by decreased unneccessary ED visits that could be addressed at primary care over the next 3 months.       Follow Up Date 04/18/20  Why is this important?   When you are  stressed, down or upset, your body reacts too.  For example, your blood pressure may get higher; you may have a headache or stomachache.  When your emotions get the best of you, your body's ability to fight off cold and flu gets weak.  These steps will help you manage your emotions.     Notes: This is an issue that Ms. Ambrose struggles with. She is easily excited and anxious with any change from what she perceives is her "normal." Most incidents are not urgent and she needs counseling about for example BP and Pulse normals to be reassured. This will be an ongoing need for her to be reassured. 03/23/20 Pt did have 2 ED visits within 2 weeks of establishing this goal but none in Nov and Dec. She had a warranted hospitalization in Jan for [place]. She will call NP for advice and expresses appreciation for our conversations and assistance in understanding her health problems.      We agreed to talk again in 2 weeks.  [name]. [name], MSN, [facility] Gerontological Nurse Practitioner [facility] Care Management [phone]

## 2020-03-31 NOTE — Progress Notes (Deleted)
[place] [facility] [address] [place], [place] [address] [phone]  Date:  04/03/2020   Name:  Stephanie Parks   DOB:  25-Apr-1945   MRN:  [id number]  PCP:  [name], MD    Chief Complaint: No chief complaint on file.   History of Present Illness:  Stephanie Parks is a 75 y.o. very pleasant female patient who presents with the following:  Following up from recent hospital admission   Admit date: 03/12/2020 Discharge date: 03/21/2020  Recommendations for Outpatient Follow-up:  1. Follow outpatient CBC/CMP 2. Follow oxygen need outpatient - discharged with 2 L with activity 3. Discharged with eliquis for PE, needs at least 3 months uninterrupted for provoked VTE - after 3 months, recommend discussion with PCP regarding risk/benefits of continuing therapy.  In setting of provoked VTE, if no risk factors at that time, would be reasonable to d/c anticoagulation after 3 months. 4. Follow encephalopathy outpatient - mild encephalopathy throughout hospitalization, improved, but persistent at discharge - continue to follow outpatient, consider further w/u, neurology follow up if persistent 5. Needs follow up echo in about 3 months for reduced RVSF and elevated PASP 6. Follow BP off HCTZ/triamterene  7. Quarantine per [employer] protocols (21 days from positive test - until 2/16) 8. Left hepatic lobe lesion - follow outpatient, likely benign per rads  Discharge Diagnoses:  Principal Problem:   Pneumonia due to COVID-19 virus Active Problems:   HTN (hypertension)   CAD (coronary artery disease)  History of present illness:  [name] a74 y.o.femalewith a history of HTN, HLD, hypokalemia, and triple-vaccinated against covid who presented to the ED 1/23 for some confusion for a few days, shortness of breath for a week, also reporting floaters intermittently for 2 weeks. She underwent CTA chest on 1/21 as an  outpatient per her PCP after d-dimer resulted positive as part of work up for shortness of breath/chest discomfort. This was suggestive of multifocal pneumonia. PCP was unable to reach patient with these results, despite many attempts. In triage the patient was hypoxic requiring 5L HFNC, SARS-CoV-2 PCR positive, CRP 9.2, PCT and WBC wnl, BNP 34, troponin 18. Remdesivir, steroids were given and admission requested. Due to progressive hypoxia to requiring 15L, baricitinib was added.CTA revealed acute pulmonary embolismfor which lovenox was added.  She's gradually improved with steroids, remdesivir, and baricitinib.  She's had persistent encephalopathy which has gradually improved during he hospitalization as well.  She's stable for discharge on 2/1 with supplemental oxygen with activity, steroid taper, and eliquis for VTE.  She remains mildly encephalopathic on discharge, though appears safe for discharge at this time with continued outpatient follow up.  Hospital Course:  Acute Metabolic Encephalopathy  Likely 2/2 covid and acute hospitalization Overall improving, A&Ox2 this AM (knew hospital, but couldn't specify which) With leukocytosis will start infectious w/u ->blood cx - NGTD, UA - not concerning for UTI. CXR with bilateral interstitial markings compatible with atypical infection or edema. Consider imaging if worsening/persistent or focal deficit Normal TSH, follow b12 (elevated), folate (wnl), ammonia wnl.  Delirium precautions Tricyclic d/c'd Follow at home in a familiar environment Consider additional workup, neurology follow up if persistent.  Acute hypoxemic respiratory failure due to covid-19 pneumoniaand acute pulmonary embolism:In pt vaccinated x3, +on 1/23 with typical CTA chest findings dating to 1/21and development of RUL PE on CTA 1/24. -On RA at rest, requiring 1-2 L with activity, will need home  O2 - CT PE protocol with acute PE within proximal R upper lobar artery,  COVID 19 pattern pneumonia - CXR 1/27 diffuse bilateral increased interstitial markings compatible with atpyical infection or edema - follow echocardiogram -> EF 55-60%, no RWMA, moderate LVH, diastolic dysfunction, RVSF moderately reduced -continue lovenox 1mg /kg q12h ->transition to eliquis - Remdesivir (1/23-1/27), steroid taper (1/23-present), baricitinib (1/23 - 2/1) all indicated and being given.  Will d/c with steroid taper.  - strict I/O, daily weights - prone as able, OOB, IS, flutter, therapy  COVID-19 Labs  Recent Labs (last 2 labs)       Recent Labs    03/19/20 0429 03/20/20 0406  DDIMER 2.90* 2.57*  FERRITIN 169 177  CRP 1.0* 0.7      Recent Labs  Lab Results  Component Value Date   SARSCOV2NAA POSITIVE (A) 03/12/2020   [place] NEGATIVE 02/22/2019     Acute Pulmonary Embolism 2/2 COVID  Provoked by covid.  Will need at least 3 months uninterrupted anticoagulation.  Discharged on eliquis.  After 3 months will need to discuss with PCP regarding risks/benefits of continuing beyond 3 months, in setting of provoked PE, would be reasonable to discontinue at this time. Leukocytosis: improved, likely related to steroids, follow AKI improved Moderately Reduced RVSF  Moderately Elevated PASP Likely related to covid infection and/or PE Will need repeat echocardiogram to follow up after she's improved AST elevation: Mild, likely due to covid.  - resolved Hypokalemia  Hyperkalmia:  - Supplemented - hyperk resolved HTN - continue metoprolol, losartan - currently holding thiazide/triameterene at discharge as BP's reasonable HLD - continue statin CAD: Has been evaluated by Dr. Irish Lack as outpt. Troponin very mildly elevated with flat trend and no current chest pain or ischemic ECG changes (suggestive of LVH however) - No inpatient evaluation planned - d/c aspirin, eliquis started with PE (2020 cath with normal coronaries) Allergic rhinitis:  - Restart  home flonase Obesity:Estimated body mass index is 32.65 kg/m as calculated from the following: Height as of this encounter: 5\' 5"  (1.651 m). Weight as of this encounter: 89 kg.      Patient Active Problem List   Diagnosis Date Noted  . Pneumonia due to COVID-19 virus 03/12/2020  . Seasonal allergic conjunctivitis 10/04/2019  . Dysfunction of right eustachian tube 10/04/2019  . Pre-diabetes 06/02/2019  . Seasonal and perennial allergic rhinitis 12/22/2018  . Heart palpitations 12/22/2018  . History of chest pain 07/20/2018  . Brain aneurysm 07/20/2018  . Bronchitis 04/23/2018  . Tendonitis, Achilles, right 12/08/2017  . Onychomycosis 12/08/2017  . Unilateral primary osteoarthritis, left knee 10/07/2017  . Ingrown toenail 08/10/2017  . Coughing 04/24/2017  . CAD (coronary artery disease) 03/01/2015  . Aneurysm ([place]) 03/01/2015  . Dizziness 03/01/2015  . Paresthesia of arm 03/01/2015  . Allergic rhinitis 03/01/2015  . Achalasia 09/28/2012  . CN (constipation) 09/28/2012  . Cephalalgia 08/24/2012  . HTN (hypertension) 04/19/2011  . Hyperlipidemia 04/19/2011  . GERD (gastroesophageal reflux disease) 04/19/2011  . Chest pain 04/04/2011    Past Medical History:  Diagnosis Date  . 3-vessel CAD 03/01/2015  . Achalasia 09/28/2012  . Allergic rhinitis 03/01/2015  . Anemia   . Aneurysm ([place]) 03/01/2015  . BP (high blood pressure) 03/01/2015  . Bronchitis 04/23/2018  . Cephalalgia 08/24/2012   Overview:  ICD-10 cut over    . Chest pain 04/04/2011  . CN (constipation) 09/28/2012  . Coughing 04/24/2017  . Decreased potassium in the blood 03/01/2015  . Diverticulosis   .  Dizziness 03/01/2015  . GERD (gastroesophageal reflux disease)   . Hiatal hernia   . Hypercholesterolemia 03/01/2015  . Hyperlipidemia   . Hypertension   . Ingrown toenail 08/10/2017  . Inguinal hernia   . Migraine headache   . Onychomycosis 12/08/2017  . Paresthesia of arm 03/01/2015  . Schatzki's ring   .  Tendonitis, Achilles, right 12/08/2017  . Unilateral primary osteoarthritis, left knee 10/07/2017    Past Surgical History:  Procedure Laterality Date  . ABDOMINAL HYSTERECTOMY    . BACK SURGERY     x 3  . CARPAL TUNNEL RELEASE     left wrist  . CERVICAL SPINE SURGERY    . HEMORRHOID SURGERY      Social History   Tobacco Use  . Smoking status: Former Smoker    Packs/day: 0.50    Years: 20.00    Pack years: 10.00    Types: Cigarettes    Quit date: 02/19/1984    Years since quitting: 36.1  . Smokeless tobacco: Never Used  Vaping Use  . Vaping Use: Never used  Substance Use Topics  . Alcohol use: No  . Drug use: No    Family History  Problem Relation Age of Onset  . Allergic rhinitis Sister   . Diabetes Sister   . Hypertension Sister   . Heart attack Father 56  . Heart disease Father   . Stomach cancer Maternal Grandmother   . Heart disease Mother   . Colon cancer Neg Hx   . Angioedema Neg Hx   . Asthma Neg Hx   . Eczema Neg Hx   . Urticaria Neg Hx   . Immunodeficiency Neg Hx   . Breast cancer Neg Hx   . Migraines Neg Hx   . Headache Neg Hx     Allergies  Allergen Reactions  . Shellfish Allergy Hives and Swelling  . Ace Inhibitors Other (See Comments)    Pt cannot recall her reaction but tolerates arb   . Gabapentin Other (See Comments)    headaches  . Pitavastatin     Unknown reaction   . Topiramate     Heart race,   . Sulfa Antibiotics Other (See Comments) and Rash    Fine bumps    Medication list has been reviewed and updated.  Current Outpatient Medications on File Prior to Visit  Medication Sig Dispense Refill  . APIXABAN (ELIQUIS) VTE STARTER PACK (10MG  AND 5MG ) Take as directed on package: start with two-5mg  tablets twice daily for 7 days. On day 8, switch to one-5mg  tablet twice daily. 1 each 0  . ascorbic acid (VITAMIN C) 500 MG tablet Take 500 mg by mouth daily.    . benzonatate (TESSALON) 100 MG capsule Take 1 capsule (100 mg total) by  mouth 3 (three) times daily as needed for cough. 20 capsule 0  . Calcium Carb-Cholecalciferol (CALCIUM 600 + D PO) Take 1 tablet by mouth daily.    Marland Kitchen ELDERBERRY PO Take 50 mg by mouth daily.    Marland Kitchen EPINEPHrine (EPIPEN 2-PAK) 0.3 mg/0.3 mL IJ SOAJ injection Inject 0.3 mLs (0.3 mg total) into the muscle as needed for anaphylaxis. Per allergen immunotherapy protocol 1 each 2  . fluticasone (FLONASE) 50 MCG/ACT nasal spray USE 2 SPRAYS IN NOSTRIL(S) DAILY AS NEEDED.  In the right nostril point the applicator out toward your right ear.  In the left nostril point the applicator out toward her left ear (Patient taking differently: Place 2 sprays into both nostrils daily  as needed for allergies.) 16 g 9  . losartan (COZAAR) 50 MG tablet Take 1 tablet (50 mg total) by mouth daily. 90 tablet 3  . metoprolol succinate (TOPROL-XL) 25 MG 24 hr tablet Take 0.5 tablets (12.5 mg total) by mouth daily. 90 tablet 1  . Multiple Vitamins-Minerals (MULTIVITAL) tablet Take 1 tablet by mouth daily.     . pantoprazole (PROTONIX) 40 MG tablet Take 1 tablet (40 mg total) by mouth 2 (two) times daily. 180 tablet 1  . potassium chloride SA (KLOR-CON M20) 20 MEQ tablet TAKE 2 PILLS (40 MEQ) ONCE ON DAY ONE, THEN TAKE ONE DAILY AS DIRECTED BY MD 30 tablet 0  . Probiotic Product (PROBIOTIC PO) Take 1 capsule by mouth daily.    . rizatriptan (MAXALT-MLT) 10 MG disintegrating tablet Take 10 mg by mouth as needed for migraine. May repeat in 2 hours if needed    . rosuvastatin (CRESTOR) 10 MG tablet TAKE 1 TABLET BY MOUTH EVERY DAY (Patient taking differently: Take 10 mg by mouth daily.) 90 tablet 3  . TUBERCULIN SYR 1CC/27GX1/2" (MONOJECT SYRINGE 1ML 27GX1/2") 27G X 1/2" 1 ML MISC 10 each by Does not apply route once as needed for up to 1 dose. 10 each 0  . zinc gluconate 50 MG tablet Take 50 mg by mouth daily.     No current facility-administered medications on file prior to visit.    Review of Systems:  As per HPI- otherwise  negative.   Physical Examination: There were no vitals filed for this visit. There were no vitals filed for this visit. There is no height or weight on file to calculate BMI. Ideal Body Weight:    GEN: no acute distress. HEENT: Atraumatic, Normocephalic.  Ears and Nose: No external deformity. CV: RRR, No M/G/R. No JVD. No thrill. No extra heart sounds. PULM: CTA B, no wheezes, crackles, rhonchi. No retractions. No resp. distress. No accessory muscle use. ABD: S, NT, ND, +BS. No rebound. No HSM. EXTR: No c/c/e PSYCH: Normally interactive. Conversant.    Assessment and Plan: *** This visit occurred during the SARS-CoV-2 public health emergency.  Safety protocols were in place, including screening questions prior to the visit, additional usage of staff PPE, and extensive cleaning of exam room while observing appropriate contact time as indicated for disinfecting solutions.    Signed Lamar Blinks, MD

## 2020-04-03 ENCOUNTER — Ambulatory Visit: Payer: Medicare Other | Admitting: Family Medicine

## 2020-04-03 ENCOUNTER — Telehealth: Payer: Self-pay

## 2020-04-03 DIAGNOSIS — J9601 Acute respiratory failure with hypoxia: Secondary | ICD-10-CM | POA: Diagnosis not present

## 2020-04-03 DIAGNOSIS — K5900 Constipation, unspecified: Secondary | ICD-10-CM | POA: Diagnosis not present

## 2020-04-03 DIAGNOSIS — Z7901 Long term (current) use of anticoagulants: Secondary | ICD-10-CM | POA: Diagnosis not present

## 2020-04-03 DIAGNOSIS — E785 Hyperlipidemia, unspecified: Secondary | ICD-10-CM | POA: Diagnosis not present

## 2020-04-03 DIAGNOSIS — M1712 Unilateral primary osteoarthritis, left knee: Secondary | ICD-10-CM | POA: Diagnosis not present

## 2020-04-03 DIAGNOSIS — K5790 Diverticulosis of intestine, part unspecified, without perforation or abscess without bleeding: Secondary | ICD-10-CM | POA: Diagnosis not present

## 2020-04-03 DIAGNOSIS — U071 COVID-19: Secondary | ICD-10-CM | POA: Diagnosis not present

## 2020-04-03 DIAGNOSIS — I2699 Other pulmonary embolism without acute cor pulmonale: Secondary | ICD-10-CM | POA: Diagnosis not present

## 2020-04-03 DIAGNOSIS — J302 Other seasonal allergic rhinitis: Secondary | ICD-10-CM | POA: Diagnosis not present

## 2020-04-03 DIAGNOSIS — H43393 Other vitreous opacities, bilateral: Secondary | ICD-10-CM | POA: Diagnosis not present

## 2020-04-03 DIAGNOSIS — Z7952 Long term (current) use of systemic steroids: Secondary | ICD-10-CM | POA: Diagnosis not present

## 2020-04-03 DIAGNOSIS — R7309 Other abnormal glucose: Secondary | ICD-10-CM | POA: Diagnosis not present

## 2020-04-03 DIAGNOSIS — H6991 Unspecified Eustachian tube disorder, right ear: Secondary | ICD-10-CM | POA: Diagnosis not present

## 2020-04-03 DIAGNOSIS — I2510 Atherosclerotic heart disease of native coronary artery without angina pectoris: Secondary | ICD-10-CM | POA: Diagnosis not present

## 2020-04-03 DIAGNOSIS — G43909 Migraine, unspecified, not intractable, without status migrainosus: Secondary | ICD-10-CM | POA: Diagnosis not present

## 2020-04-03 DIAGNOSIS — I671 Cerebral aneurysm, nonruptured: Secondary | ICD-10-CM | POA: Diagnosis not present

## 2020-04-03 DIAGNOSIS — K222 Esophageal obstruction: Secondary | ICD-10-CM | POA: Diagnosis not present

## 2020-04-03 DIAGNOSIS — I10 Essential (primary) hypertension: Secondary | ICD-10-CM | POA: Diagnosis not present

## 2020-04-03 DIAGNOSIS — M7661 Achilles tendinitis, right leg: Secondary | ICD-10-CM | POA: Diagnosis not present

## 2020-04-03 DIAGNOSIS — K219 Gastro-esophageal reflux disease without esophagitis: Secondary | ICD-10-CM | POA: Diagnosis not present

## 2020-04-03 DIAGNOSIS — Z7951 Long term (current) use of inhaled steroids: Secondary | ICD-10-CM | POA: Diagnosis not present

## 2020-04-03 DIAGNOSIS — J1282 Pneumonia due to coronavirus disease 2019: Secondary | ICD-10-CM | POA: Diagnosis not present

## 2020-04-03 DIAGNOSIS — G9341 Metabolic encephalopathy: Secondary | ICD-10-CM | POA: Diagnosis not present

## 2020-04-03 DIAGNOSIS — D649 Anemia, unspecified: Secondary | ICD-10-CM | POA: Diagnosis not present

## 2020-04-03 NOTE — Telephone Encounter (Signed)
Patient called stating her last injection 03/24/20 left her really congested. I informed to take an antihistamine the night before or the morning of receiving her injections. She states she has been taking her nasal sprays Flonase and Astelin regularly.

## 2020-04-04 ENCOUNTER — Ambulatory Visit: Payer: Medicare Other | Admitting: Family

## 2020-04-04 ENCOUNTER — Other Ambulatory Visit: Payer: Self-pay

## 2020-04-04 ENCOUNTER — Encounter: Payer: Self-pay | Admitting: Family

## 2020-04-04 VITALS — BP 124/66 | HR 80 | Temp 98.6°F | Resp 16

## 2020-04-04 DIAGNOSIS — Z8616 Personal history of COVID-19: Secondary | ICD-10-CM | POA: Diagnosis not present

## 2020-04-04 DIAGNOSIS — H1013 Acute atopic conjunctivitis, bilateral: Secondary | ICD-10-CM | POA: Diagnosis not present

## 2020-04-04 DIAGNOSIS — H6981 Other specified disorders of Eustachian tube, right ear: Secondary | ICD-10-CM

## 2020-04-04 DIAGNOSIS — R0600 Dyspnea, unspecified: Secondary | ICD-10-CM

## 2020-04-04 DIAGNOSIS — J309 Allergic rhinitis, unspecified: Secondary | ICD-10-CM

## 2020-04-04 DIAGNOSIS — H1010 Acute atopic conjunctivitis, unspecified eye: Secondary | ICD-10-CM

## 2020-04-04 DIAGNOSIS — Z8709 Personal history of other diseases of the respiratory system: Secondary | ICD-10-CM

## 2020-04-04 MED ORDER — ALBUTEROL SULFATE HFA 108 (90 BASE) MCG/ACT IN AERS: 2.0000 | INHALATION_SPRAY | RESPIRATORY_TRACT | 1 refills | Status: DC | PRN

## 2020-04-04 NOTE — Progress Notes (Signed)
[place] [facility] [address] [place], [place] [address] [phone]  Date:  04/06/2020   Name:  Stephanie Parks   DOB:  05/04/1945   MRN:  [id number]  PCP:  [name], MD    Chief Complaint: Hospitalization Follow-up (Recheck abdomen, knots where shots were given)   History of Present Illness:  Stephanie Parks is a 75 y.o. very pleasant female patient who presents with the following:  Following up from recent hospital stay- we had a virtual visit on 2/9  She was recently hospitalized for several days with COVID-19 related pneumonia, pulmonary embolism and now oxygen dependence Patient location is home, provider location is at office.  Connected with patient via telephone, the patient and myself are present on the call today  Admit date:03/12/2020 Discharge date:03/21/2020  Recommendations for Outpatient Follow-up: 1. Follow outpatient CBC/CMP 2. Follow oxygen need outpatient - discharged with 2 L with activity 3. Discharged with eliquis for PE, needs at least 3 months uninterrupted for provoked VTE - after 3 months, recommend discussion with PCP regarding risk/benefits of continuing therapy. In setting of provoked VTE, if no risk factors at that time, would be reasonable to d/c anticoagulation after 3 months. 4. Follow encephalopathy outpatient - mild encephalopathy throughout hospitalization, improved, but persistent at discharge - continue to follow outpatient, consider further w/u, neurology follow up if persistent 5. Needs follow up echo in about 3 months for reduced RVSF and elevated PASP 6. Follow BP off HCTZ/triamterene  7. Quarantine per [employer] protocols (21 days from positive test - until 2/16) 8. Left hepatic lobe lesion - follow outpatient, likely benign per rads  Discharge Diagnoses: Principal Problem: Pneumonia due to COVID-19 virus Active Problems: HTN (hypertension) CAD (coronary  artery disease)  History of present illness: Stephanie Parks a74 y.o.femalewith a history of HTN, HLD, hypokalemia, and triple-vaccinated against covid who presented to the ED 1/23 for some confusion for a few days, shortness of breath for a week, also reporting floaters intermittently for 2 weeks. She underwent CTA chest on 1/21 as an outpatient per her PCP after d-dimer resulted positive as part of work up for shortness of breath/chest discomfort. This was suggestive of multifocal pneumonia. PCP was unable to reach patient with these results, despite many attempts. In triage the patient was hypoxic requiring 5L HFNC, SARS-CoV-2 PCR positive, CRP 9.2, PCT and WBC wnl, BNP 34, troponin 18. Remdesivir, steroids were given and admission requested. Due to progressive hypoxia to requiring 15L, baricitinib was added.CTA revealed acute pulmonary embolismfor which lovenox was added.She's gradually improved with steroids, remdesivir, and baricitinib. She's had persistent encephalopathy which has gradually improved during he hospitalization as well. She's stable for discharge on 2/1 with supplemental oxygen with activity, steroid taper, and eliquis for VTE. She remains mildly encephalopathic on discharge, though appears safe for discharge at this time with continued outpatient follow up.  Hospital Course: Acute Metabolic Encephalopathy  Likely 2/2 covid and acute hospitalization Overall improving, A&Ox2 this AM (knew hospital, but couldn't specify which) With leukocytosis will start infectious w/u ->blood cx - NGTD, UA - not concerning for UTI. CXR with bilateral interstitial markings compatible with atypical infection or edema. Consider imaging if worsening/persistent or focal deficit Normal TSH, follow b12 (elevated), folate (wnl), ammonia wnl.  Delirium precautions Tricyclic d/c'd Follow at home in a familiar environment Consider additional workup, neurology follow up if  persistent.  Acute hypoxemic respiratory failure due to covid-19  pneumoniaand acute pulmonary embolism:In pt vaccinated x3, +on 1/23 with typical CTA chest findings dating to 1/21and development of RUL PE on CTA 1/24. -On RA at rest, requiring1-2 L with activity, will need home O2 - CT PE protocol with acute PE within proximal R upper lobar artery, COVID 19 pattern pneumonia - CXR 1/27 diffuse bilateral increased interstitial markings compatible with atpyical infection or edema - follow echocardiogram -> EF 55-60%, no RWMA, moderate LVH, diastolic dysfunction, RVSF moderately reduced -continue lovenox 1mg /kg q12h ->transition to eliquis - Remdesivir (1/23-1/27), steroid taper (1/23-present), baricitinib (1/23 -2/1) all indicated and being given.Will d/c with steroid taper. - strict I/O, daily weights - prone as able, OOB, IS, flutter, therapy  COVID-19 Labs  Recent Labs (last 2 labs)       Recent Labs   03/19/20 0429 03/20/20 0406  DDIMER 2.90* 2.57*  FERRITIN 169 177  CRP 1.0* 0.7      Recent Labs       Lab Results  Component Value Date   SARSCOV2NAA POSITIVE (A) 03/12/2020   [place] NEGATIVE 02/22/2019     Acute Pulmonary Embolism 2/2 COVID  Provoked by covid. Will need at least 3 months uninterrupted anticoagulation. Discharged on eliquis. After 3 months will need to discuss with PCP regarding risks/benefits of continuing beyond 3 months, in setting of provoked PE, would be reasonable to discontinue at this time. Leukocytosis: improved, likely related to steroids, follow AKI improved Moderately Reduced RVSF  Moderately Elevated PASP Likely related to covid infection and/or PE Will need repeat echocardiogram to follow up after she's improved AST elevation: Mild, likely due to covid.  - resolved Hypokalemia  Hyperkalmia:  - Supplemented - hyperk resolved HTN - continue metoprolol, losartan - currently holding thiazide/triameterene  at discharge as BP's reasonable HLD - continue statin CAD: Has been evaluated by Dr. Irish Lack as outpt. Troponin very mildly elevated with flat trend and no current chest pain or ischemic ECG changes (suggestive of LVH however) - No inpatient evaluation planned - d/c aspirin, eliquis started with PE (2020 cath with normal coronaries) Allergic rhinitis:  - Restart home flonase Obesity:Estimated body mass index is 32.65 kg/m as calculated from the following: Height as of this encounter: 5\' 5"  (1.651 m). Weight as of this encounter: 89 kg.  Procedures: Echo IMPRESSIONS  1. Left ventricular ejection fraction, by estimation, is 55 to 60%. The  left ventricle has normal function. The left ventricle has no regional  wall motion abnormalities. There is moderate left ventricular hypertrophy.  Left ventricular diastolic  parameters are consistent with Grade I diastolic dysfunction (impaired  relaxation).   Using oxygen at home - she has a portable concentrator  She is getting PT at home but this might be over -she is not quite sure.  I encouraged her to continue physical therapy as long as her insurance will cover She is still using oxygen 24/7; she is on 4L still She tried turning it down to 2 or 3 L but her oxygen did go to the upper 80s She does have a pulse oximeter at home but did not bring it with her today  She is taking K stil, but she is not on her diuretic now.  Will check this for her today  She does not have home health nursing at this time  She saw her allergist 2 days ago -they mentioned possibly referring her to a pulmonologist.  We discussed this today.  For the time being Obelia would like to give herself more  time to improve from recent Covid infection before seeing another consultant  She works at [employer], will need FMLA coverage for her current illness She went out of work on 1/21- will try to RTW on 4/1; once I receive her paperwork I will give her 1 continuous  absence for these dates  Patient Active Problem List   Diagnosis Date Noted  . Pneumonia due to COVID-19 virus 03/12/2020  . Seasonal allergic conjunctivitis 10/04/2019  . Dysfunction of right eustachian tube 10/04/2019  . Pre-diabetes 06/02/2019  . Seasonal and perennial allergic rhinitis 12/22/2018  . Heart palpitations 12/22/2018  . History of chest pain 07/20/2018  . Brain aneurysm 07/20/2018  . Bronchitis 04/23/2018  . Tendonitis, Achilles, right 12/08/2017  . Onychomycosis 12/08/2017  . Unilateral primary osteoarthritis, left knee 10/07/2017  . Ingrown toenail 08/10/2017  . Coughing 04/24/2017  . CAD (coronary artery disease) 03/01/2015  . Aneurysm ([place]) 03/01/2015  . Dizziness 03/01/2015  . Paresthesia of arm 03/01/2015  . Allergic rhinitis 03/01/2015  . Achalasia 09/28/2012  . CN (constipation) 09/28/2012  . Cephalalgia 08/24/2012  . HTN (hypertension) 04/19/2011  . Hyperlipidemia 04/19/2011  . GERD (gastroesophageal reflux disease) 04/19/2011  . Chest pain 04/04/2011    Past Medical History:  Diagnosis Date  . 3-vessel CAD 03/01/2015  . Achalasia 09/28/2012  . Allergic rhinitis 03/01/2015  . Anemia   . Aneurysm ([place]) 03/01/2015  . BP (high blood pressure) 03/01/2015  . Bronchitis 04/23/2018  . Cephalalgia 08/24/2012   Overview:  ICD-10 cut over    . Chest pain 04/04/2011  . CN (constipation) 09/28/2012  . Coughing 04/24/2017  . Decreased potassium in the blood 03/01/2015  . Diverticulosis   . Dizziness 03/01/2015  . GERD (gastroesophageal reflux disease)   . Hiatal hernia   . Hypercholesterolemia 03/01/2015  . Hyperlipidemia   . Hypertension   . Ingrown toenail 08/10/2017  . Inguinal hernia   . Migraine headache   . Onychomycosis 12/08/2017  . Paresthesia of arm 03/01/2015  . Schatzki's ring   . Tendonitis, Achilles, right 12/08/2017  . Unilateral primary osteoarthritis, left knee 10/07/2017    Past Surgical History:  Procedure Laterality Date  . ABDOMINAL  HYSTERECTOMY    . BACK SURGERY     x 3  . CARPAL TUNNEL RELEASE     left wrist  . CERVICAL SPINE SURGERY    . HEMORRHOID SURGERY      Social History   Tobacco Use  . Smoking status: Former Smoker    Packs/day: 0.50    Years: 20.00    Pack years: 10.00    Types: Cigarettes    Quit date: 02/19/1984    Years since quitting: 36.1  . Smokeless tobacco: Never Used  Vaping Use  . Vaping Use: Never used  Substance Use Topics  . Alcohol use: No  . Drug use: No    Family History  Problem Relation Age of Onset  . Allergic rhinitis Sister   . Diabetes Sister   . Hypertension Sister   . Heart attack Father 32  . Heart disease Father   . Stomach cancer Maternal Grandmother   . Heart disease Mother   . Colon cancer Neg Hx   . Angioedema Neg Hx   . Asthma Neg Hx   . Eczema Neg Hx   . Urticaria Neg Hx   . Immunodeficiency Neg Hx   . Breast cancer Neg Hx   . Migraines Neg Hx   . Headache Neg Hx  Allergies  Allergen Reactions  . Shellfish Allergy Hives and Swelling  . Ace Inhibitors Other (See Comments)    Pt cannot recall her reaction but tolerates arb   . Gabapentin Other (See Comments)    headaches  . Pitavastatin     Unknown reaction   . Topiramate     Heart race,   . Sulfa Antibiotics Other (See Comments) and Rash    Fine bumps    Medication list has been reviewed and updated.  Current Outpatient Medications on File Prior to Visit  Medication Sig Dispense Refill  . albuterol (PROAIR HFA) 108 (90 Base) MCG/ACT inhaler Inhale 2 puffs into the lungs every 4 (four) hours as needed for wheezing or shortness of breath. 1 each 1  . APIXABAN (ELIQUIS) VTE STARTER PACK (10MG  AND 5MG ) Take as directed on package: start with two-5mg  tablets twice daily for 7 days. On day 8, switch to one-5mg  tablet twice daily. 1 each 0  . ascorbic acid (VITAMIN C) 500 MG tablet Take 500 mg by mouth daily.    . Calcium Carb-Cholecalciferol (CALCIUM 600 + D PO) Take 1 tablet by mouth  daily.    Marland Kitchen ELDERBERRY PO Take 50 mg by mouth daily.    Marland Kitchen EPINEPHrine (EPIPEN 2-PAK) 0.3 mg/0.3 mL IJ SOAJ injection Inject 0.3 mLs (0.3 mg total) into the muscle as needed for anaphylaxis. Per allergen immunotherapy protocol 1 each 2  . fluticasone (FLONASE) 50 MCG/ACT nasal spray USE 2 SPRAYS IN NOSTRIL(S) DAILY AS NEEDED.  In the right nostril point the applicator out toward your right ear.  In the left nostril point the applicator out toward her left ear (Patient taking differently: Place 2 sprays into both nostrils daily as needed for allergies.) 16 g 9  . LOSARTAN POTASSIUM PO Take by mouth.    . metoprolol succinate (TOPROL-XL) 25 MG 24 hr tablet Take 0.5 tablets (12.5 mg total) by mouth daily. 90 tablet 1  . Multiple Vitamins-Minerals (MULTIVITAL) tablet Take 1 tablet by mouth daily.     . pantoprazole (PROTONIX) 40 MG tablet Take 1 tablet (40 mg total) by mouth 2 (two) times daily. 180 tablet 1  . potassium chloride SA (KLOR-CON M20) 20 MEQ tablet TAKE 2 PILLS (40 MEQ) ONCE ON DAY ONE, THEN TAKE ONE DAILY AS DIRECTED BY MD 30 tablet 0  . Probiotic Product (PROBIOTIC PO) Take 1 capsule by mouth daily.    . rizatriptan (MAXALT-MLT) 10 MG disintegrating tablet Take 10 mg by mouth as needed for migraine. May repeat in 2 hours if needed    . rosuvastatin (CRESTOR) 10 MG tablet TAKE 1 TABLET BY MOUTH EVERY DAY (Patient taking differently: Take 10 mg by mouth daily.) 90 tablet 3  . TUBERCULIN SYR 1CC/27GX1/2" (MONOJECT SYRINGE 1ML 27GX1/2") 27G X 1/2" 1 ML MISC 10 each by Does not apply route once as needed for up to 1 dose. 10 each 0  . zinc gluconate 50 MG tablet Take 50 mg by mouth daily.     No current facility-administered medications on file prior to visit.    Review of Systems:  As per HPI- otherwise negative.   Physical Examination: Vitals:   04/06/20 1040  BP: 136/72  Pulse: 86  Resp: 17  SpO2: 91%   Vitals:   04/06/20 1040  Weight: 186 lb (84.4 kg)  Height: 5\' 5"  (1.651  m)   Body mass index is 30.95 kg/m. Ideal Body Weight: Weight in (lb) to have BMI = 25: 149.9  GEN: no acute distress.  Looks well  HEENT: Atraumatic, Normocephalic.  Ears and Nose: No external deformity. CV: RRR, No M/G/R. No JVD. No thrill. No extra heart sounds. PULM: CTA B, no wheezes, crackles, rhonchi. No retractions. No resp. distress. No accessory muscle use. ABD: S, NT, ND, +BS. No rebound. No HSM. EXTR: No c/c/e PSYCH: Normally interactive. Conversant.  She has a few small superficial nodules in her abdominal tissue, this is likely from recent Lovenox injections Wearing oxygen, currently at 4 L She does have an artificial nails, I advised her that removing these might make it easier for her to monitor her oxygen at home.  She is aware and does plan to have her nails taken off   Assessment and Plan: Hospital discharge follow-up - Plan: Basic metabolic panel, CBC  Pneumonia due to COVID-19 virus - Plan: Basic metabolic panel, DG Chest 2 View  Other acute pulmonary embolism, unspecified whether acute cor pulmonale present ([place]) - Plan: apixaban (ELIQUIS) 5 MG TABS tablet  Oxygen desaturation - Plan: Ambulatory referral to [place]   Patient following up today from hospital admission with HMCNO-70 pneumonia complicated by pulmonary embolism, currently using oxygen Overall [place] does feel that she is making progress. We will plan to continue Eliquis for 3 months, I wrote a prescription for her to take when she finishes starter pack She is having some difficulty figuring out how much oxygen to use and when she can wean.  Measuring her oxygen saturation can be more difficult with artificial nails.  She does plan to have these removed soon.  I have ordered home health nursing to assist with oxygen weaning process This visit occurred during the SARS-CoV-2 public health emergency.  Safety protocols were in place, including screening questions prior to the visit, additional  usage of staff PPE, and extensive cleaning of exam room while observing appropriate contact time as indicated for disinfecting solutions.    Signed Lamar Blinks, MD  Received her results as below Called patient Potassium is low, she will increase her potassium to 2 pills daily for 3 days, then return to 1 pill a day Noted significant drop in hemoglobin.  She denies any blood in stools, no dark tarry stools.  I discussed her chest film with radiology.  Her pulmonary findings have worsened since last chest x-ray  Clinically, patient does feel that she is improving.  She is currently stable on 2 L of oxygen. We plan to see her on Monday for an office follow-up and to recheck CBC. I will also call and check on her over the weekend Pt is agreeable with plan and will contact me or otherwise seek care if not doing ok/ getting worse  Results for orders placed or performed in visit on 96/28/36  Basic metabolic panel  Result Value Ref Range   Sodium 142 135 - 145 mEq/L   Potassium 3.2 (L) 3.5 - 5.1 mEq/L   Chloride 102 96 - 112 mEq/L   CO2 35 (H) 19 - 32 mEq/L   Glucose, Bld 87 70 - 99 mg/dL   BUN 9 6 - 23 mg/dL   Creatinine, Ser 0.72 0.40 - 1.20 mg/dL   GFR 82.32 >60.00 mL/min   Calcium 9.1 8.4 - 10.5 mg/dL  CBC  Result Value Ref Range   WBC 8.8 4.0 - 10.5 K/uL   RBC 3.41 (L) 3.87 - 5.11 Mil/uL   Platelets 262.0 150.0 - 400.0 K/uL   Hemoglobin 9.8 (L) 12.0 - 15.0 g/dL  HCT 29.6 (L) 36.0 - 46.0 %   MCV 86.6 78.0 - 100.0 fl   MCHC 33.1 30.0 - 36.0 g/dL   RDW 17.4 (H) 11.5 - 15.5 %   DG Chest 2 View  Result Date: 04/06/2020 CLINICAL DATA:  Follow-up COVID-19 pneumonia EXAM: CHEST - 2 VIEW COMPARISON:  03/16/2020 FINDINGS: Stable heart size. Atherosclerotic calcification of the aortic knob. Coarsened interstitial markings bilaterally. Peripheral interstitial opacities within both lungs progressed from prior. No pleural effusion or pneumothorax. Cervical ACDF. IMPRESSION: Peripheral  interstitial opacities within both lungs, progressed from prior. Electronically Signed   By: Davina Poke D.O.   On: 04/06/2020 11:36

## 2020-04-04 NOTE — Patient Instructions (Addendum)
Allergic rhinitis Start over-the-counter Polysporin twice a day for the next week in both nostrils Begin prednisone 10 mg 1 tablet twice a day for 4 days, then take 1 tablet on the 5th day, then stop Stop using fluticasone nasal spray (Flonase) for the next week, then continue Flonase 2 sprays in each nostril once a day for a stuffy nose.  In the right nostril, point the applicator out toward the right ear. In the left nostril, point the applicator out toward the left ear Hold off on using azelastine nasal spray (Astelin) for next week, then continue azelastine (Astelin) 1 spray in each nostril twice a day for a runny nose or headache Stop using saline nasal rinses for the next week and then start back as needed for nasal symptoms. Use this before any medicated nasal sprays for best result Continue allergen avoidance measures directed toward dust mite, cockroach, and weed pollen as listed below Continue allergen immunotherapy and have access to an epinephrine autoinjector set.  Allergic conjunctivitis Continue Refresh eyedrops as per your eye doctor  Eustachian tube dysfunction  Stable  Dyspnea Refer to pulmonary due to history of COVID-19 pneumonia and history of restrictive lung disease Contact whichever physician is in charge of oxygen needs.  You may not need this much oxygen at rest and with exertion.  Too much oxygen can be dangerous.  Your oxygen saturation was 97% on room air today while in the office. Start albuterol 2 puffs every 4 hours as needed for cough, wheeze, tightness in chest, or shortness of breath  Follow up in 2 months or sooner if needed

## 2020-04-04 NOTE — Progress Notes (Signed)
[address] [place] [address] Dept: [phone]  FOLLOW UP NOTE  Patient ID: Stephanie Parks, female    DOB: [birth year]  Age: 75 y.o. MRN: [id number] Date of Office Visit: 04/04/2020  Assessment  Chief Complaint: Allergic Rhinitis   HPI Stephanie Parks is a 75 year old female who presents today for an acute visit.  She was last seen on October 04, 2019 by Gareth Morgan, FNP for allergic rhinosinusitis, allergic conjunctivitis, and eustachian tube dysfunction.  On March 12, 2020 she went to the emergency room and was admitted for acute metabolic encephalopathy and acute hypoxemia respiratory failure due to COVID-19 pneumonia and acute pulmonary embolism.  She is currently on Eliquis and has been wearing oxygen at 4 L with rest and exertion.  She is not wearing oxygen while in the office today due to her oxygen "acting up".  Allergic rhinitis is reported as not well controlled with Flonase nasal spray as needed and over-the-counter allergy relief from [facility].  She is currently not using azelastine nasal spray.  She reports since she got her last allergy injection last Thursday she has had nasal congestion that has gotten worse.  She also reports a little amount of clear rhinorrhea and sinus tenderness around her nose.  She denies any postnasal drip or fevers and chills.  When she used her saline rinse once this week it made her nose bleed.  She reports that she had" a trickle", just a little bit of blood just that one time.    Allergic conjunctivitis is reported as moderately controlled with Refresh drops as per her eye doctor.  Eustachian tube dysfunction is reported as controlled.  She reports that she is not having any issues with this right now.  Dyspnea is reported as being better since being hospitalized.  She is currently using oxygen 4 L at rest and with exertion, but is not wearing it today while in the office.  She reports shortness of breath and denies any coughing, wheezing,  tightness in her chest.  She reports that she quit smoking in 1986, but prior to that she smoked 1 pack in 2-1/2 days for 20 years.  She was given steroids for a week, but has finished this.  She does not have albuterol.   Drug Allergies:  Allergies  Allergen Reactions  . Shellfish Allergy Hives and Swelling  . Ace Inhibitors Other (See Comments)    Pt cannot recall her reaction but tolerates arb   . Gabapentin Other (See Comments)    headaches  . Pitavastatin     Unknown reaction   . Topiramate     Heart race,   . Sulfa Antibiotics Other (See Comments) and Rash    Fine bumps    Review of Systems: Review of Systems  Constitutional: Negative for chills and fever.  HENT:       Reports nasal congestion since getting her allergy injection last Thursday, sinus tenderness around her nose, and a little bit of clear rhinorrhea.  She denies any postnasal drip  Eyes:       Reports itchy watery eyes.  She is currently getting relief from Refresh that her eye doctor prescribed  Respiratory: Positive for shortness of breath. Negative for cough and wheezing.        Reports that shortness of breath is getting better since diagnosed with COVID-19, pneumonia, and pulmonary embolus  Cardiovascular: Negative for chest pain and palpitations.  Gastrointestinal: Negative for abdominal pain and heartburn.  Genitourinary: Negative for  dysuria.  Skin: Negative for itching and rash.  Neurological: Negative for headaches.  Endo/Heme/Allergies: Positive for environmental allergies.    Physical Exam: BP 124/66 (BP Location: Left Arm, Patient Position: Sitting, Cuff Size: Normal)   Pulse 80   Temp 98.6 F (37 C) (Temporal)   Resp 16   SpO2 97%    Physical Exam Constitutional:      Appearance: Normal appearance.  HENT:     Head: Normocephalic and atraumatic.     Comments: Pharynx normal, eyes normal, ears normal, nose: Bilateral lower turbinates moderately edematous and slightly erythematous with  irritation/scabbing noted in the left nostril    Right Ear: Tympanic membrane, ear canal and external ear normal.     Left Ear: Tympanic membrane, ear canal and external ear normal.     Mouth/Throat:     Mouth: Mucous membranes are moist.     Pharynx: Oropharynx is clear.  Eyes:     Conjunctiva/sclera: Conjunctivae normal.  Cardiovascular:     Rate and Rhythm: Regular rhythm.     Heart sounds: Normal heart sounds.  Pulmonary:     Effort: Pulmonary effort is normal.     Breath sounds: Normal breath sounds.     Comments: Lungs clear to auscultation Musculoskeletal:     Cervical back: Neck supple.  Skin:    General: Skin is warm.  Neurological:     Mental Status: She is alert and oriented to person, place, and time.  Psychiatric:        Mood and Affect: Mood normal.        Behavior: Behavior normal.        Thought Content: Thought content normal.        Judgment: Judgment normal.     Diagnostics: FVC 0.98 L, FEV1 0.97 L.  Predicted FVC 2.35 L, FEV1 1.82 L.  Spirometry indicates severe restriction.  Status post bronchodilator response shows FVC 1.02 L, FEV1 0.97 L.  Spirometry indicates severe restriction with no significant bronchodilator response.  Assessment and Plan: 1. Allergic rhinitis, unspecified seasonality, unspecified trigger   2. Dyspnea, unspecified type   3. History of 2019 novel coronavirus disease (COVID-19)   4. History of restrictive lung disease   5. Seasonal allergic conjunctivitis   6. Dysfunction of right eustachian tube     Meds ordered this encounter  Medications  . albuterol (PROAIR HFA) 108 (90 Base) MCG/ACT inhaler    Sig: Inhale 2 puffs into the lungs every 4 (four) hours as needed for wheezing or shortness of breath.    Dispense:  1 each    Refill:  1    Patient Instructions  Allergic rhinitis Start over-the-counter Polysporin twice a day for the next week in both nostrils Begin prednisone 10 mg 1 tablet twice a day for 4 days, then take 1  tablet on the 5th day, then stop Stop using fluticasone nasal spray (Flonase) for the next week, then continue Flonase 2 sprays in each nostril once a day for a stuffy nose.  In the right nostril, point the applicator out toward the right ear. In the left nostril, point the applicator out toward the left ear Hold off on using azelastine nasal spray (Astelin) for next week, then continue azelastine (Astelin) 1 spray in each nostril twice a day for a runny nose or headache Stop using saline nasal rinses for the next week and then start back as needed for nasal symptoms. Use this before any medicated nasal sprays for best result Continue allergen  avoidance measures directed toward dust mite, cockroach, and weed pollen as listed below Continue allergen immunotherapy and have access to an epinephrine autoinjector set.  Allergic conjunctivitis Continue Refresh eyedrops as per your eye doctor  Eustachian tube dysfunction  Stable  Dyspnea Refer to pulmonary due to history of COVID-19 pneumonia and history of restrictive lung disease Contact whichever physician is in charge of oxygen needs.  You may not need this much oxygen at rest and with exertion.  Too much oxygen can be dangerous.  Your oxygen saturation was 97% on room air today while in the office. Start albuterol 2 puffs every 4 hours as needed for cough, wheeze, tightness in chest, or shortness of breath  Follow up in 2 months or sooner if needed        Return in about 2 months (around 06/02/2020), or if symptoms worsen or fail to improve.    Thank you for the opportunity to care for this patient.  Please do not hesitate to contact me with questions.  Althea Charon, FNP [facility]

## 2020-04-05 DIAGNOSIS — M7661 Achilles tendinitis, right leg: Secondary | ICD-10-CM | POA: Diagnosis not present

## 2020-04-05 DIAGNOSIS — Z7951 Long term (current) use of inhaled steroids: Secondary | ICD-10-CM | POA: Diagnosis not present

## 2020-04-05 DIAGNOSIS — J1282 Pneumonia due to coronavirus disease 2019: Secondary | ICD-10-CM | POA: Diagnosis not present

## 2020-04-05 DIAGNOSIS — G43909 Migraine, unspecified, not intractable, without status migrainosus: Secondary | ICD-10-CM | POA: Diagnosis not present

## 2020-04-05 DIAGNOSIS — H6991 Unspecified Eustachian tube disorder, right ear: Secondary | ICD-10-CM | POA: Diagnosis not present

## 2020-04-05 DIAGNOSIS — K222 Esophageal obstruction: Secondary | ICD-10-CM | POA: Diagnosis not present

## 2020-04-05 DIAGNOSIS — E785 Hyperlipidemia, unspecified: Secondary | ICD-10-CM | POA: Diagnosis not present

## 2020-04-05 DIAGNOSIS — K219 Gastro-esophageal reflux disease without esophagitis: Secondary | ICD-10-CM | POA: Diagnosis not present

## 2020-04-05 DIAGNOSIS — I671 Cerebral aneurysm, nonruptured: Secondary | ICD-10-CM | POA: Diagnosis not present

## 2020-04-05 DIAGNOSIS — H43393 Other vitreous opacities, bilateral: Secondary | ICD-10-CM | POA: Diagnosis not present

## 2020-04-05 DIAGNOSIS — J9601 Acute respiratory failure with hypoxia: Secondary | ICD-10-CM | POA: Diagnosis not present

## 2020-04-05 DIAGNOSIS — Z7952 Long term (current) use of systemic steroids: Secondary | ICD-10-CM | POA: Diagnosis not present

## 2020-04-05 DIAGNOSIS — D649 Anemia, unspecified: Secondary | ICD-10-CM | POA: Diagnosis not present

## 2020-04-05 DIAGNOSIS — I2510 Atherosclerotic heart disease of native coronary artery without angina pectoris: Secondary | ICD-10-CM | POA: Diagnosis not present

## 2020-04-05 DIAGNOSIS — J302 Other seasonal allergic rhinitis: Secondary | ICD-10-CM | POA: Diagnosis not present

## 2020-04-05 DIAGNOSIS — Z7901 Long term (current) use of anticoagulants: Secondary | ICD-10-CM | POA: Diagnosis not present

## 2020-04-05 DIAGNOSIS — I10 Essential (primary) hypertension: Secondary | ICD-10-CM | POA: Diagnosis not present

## 2020-04-05 DIAGNOSIS — K5790 Diverticulosis of intestine, part unspecified, without perforation or abscess without bleeding: Secondary | ICD-10-CM | POA: Diagnosis not present

## 2020-04-05 DIAGNOSIS — G9341 Metabolic encephalopathy: Secondary | ICD-10-CM | POA: Diagnosis not present

## 2020-04-05 DIAGNOSIS — R0600 Dyspnea, unspecified: Secondary | ICD-10-CM | POA: Diagnosis not present

## 2020-04-05 DIAGNOSIS — U071 COVID-19: Secondary | ICD-10-CM | POA: Diagnosis not present

## 2020-04-05 DIAGNOSIS — I2699 Other pulmonary embolism without acute cor pulmonale: Secondary | ICD-10-CM | POA: Diagnosis not present

## 2020-04-05 DIAGNOSIS — R7309 Other abnormal glucose: Secondary | ICD-10-CM | POA: Diagnosis not present

## 2020-04-05 DIAGNOSIS — K5900 Constipation, unspecified: Secondary | ICD-10-CM | POA: Diagnosis not present

## 2020-04-05 DIAGNOSIS — M1712 Unilateral primary osteoarthritis, left knee: Secondary | ICD-10-CM | POA: Diagnosis not present

## 2020-04-06 ENCOUNTER — Encounter: Payer: Self-pay | Admitting: Family Medicine

## 2020-04-06 ENCOUNTER — Other Ambulatory Visit: Payer: Self-pay

## 2020-04-06 ENCOUNTER — Ambulatory Visit (INDEPENDENT_AMBULATORY_CARE_PROVIDER_SITE_OTHER): Payer: Medicare Other | Admitting: Family Medicine

## 2020-04-06 ENCOUNTER — Ambulatory Visit (HOSPITAL_BASED_OUTPATIENT_CLINIC_OR_DEPARTMENT_OTHER)
Admission: RE | Admit: 2020-04-06 | Discharge: 2020-04-06 | Disposition: A | Discharge status: Home / Self Care | Payer: Medicare Other | Source: Ambulatory Visit | Attending: Family Medicine | Admitting: Family Medicine

## 2020-04-06 VITALS — BP 136/72 | HR 86 | Resp 17 | Ht 65.0 in | Wt 186.0 lb

## 2020-04-06 DIAGNOSIS — I2699 Other pulmonary embolism without acute cor pulmonale: Secondary | ICD-10-CM | POA: Diagnosis not present

## 2020-04-06 DIAGNOSIS — J1282 Pneumonia due to coronavirus disease 2019: Secondary | ICD-10-CM | POA: Diagnosis not present

## 2020-04-06 DIAGNOSIS — Z09 Encounter for follow-up examination after completed treatment for conditions other than malignant neoplasm: Secondary | ICD-10-CM | POA: Diagnosis not present

## 2020-04-06 DIAGNOSIS — U071 COVID-19: Secondary | ICD-10-CM | POA: Diagnosis not present

## 2020-04-06 DIAGNOSIS — R0902 Hypoxemia: Secondary | ICD-10-CM | POA: Diagnosis not present

## 2020-04-06 LAB — CBC
HCT: 29.6 % — ABNORMAL LOW (ref 36.0–46.0)
Hemoglobin: 9.8 g/dL — ABNORMAL LOW (ref 12.0–15.0)
MCHC: 33.1 g/dL (ref 30.0–36.0)
MCV: 86.6 fl (ref 78.0–100.0)
Platelets: 262 10*3/uL (ref 150.0–400.0)
RBC: 3.41 Mil/uL — ABNORMAL LOW (ref 3.87–5.11)
RDW: 17.4 % — ABNORMAL HIGH (ref 11.5–15.5)
WBC: 8.8 10*3/uL (ref 4.0–10.5)

## 2020-04-06 LAB — BASIC METABOLIC PANEL
BUN: 9 mg/dL (ref 6–23)
CO2: 35 mEq/L — ABNORMAL HIGH (ref 19–32)
Calcium: 9.1 mg/dL (ref 8.4–10.5)
Chloride: 102 mEq/L (ref 96–112)
Creatinine, Ser: 0.72 mg/dL (ref 0.40–1.20)
GFR: 82.32 mL/min (ref >60.00)
Glucose, Bld: 87 mg/dL (ref 70–99)
Potassium: 3.2 mEq/L — ABNORMAL LOW (ref 3.5–5.1)
Sodium: 142 mEq/L (ref 135–145)

## 2020-04-06 MED ORDER — APIXABAN 5 MG PO TABS
5.0000 mg | ORAL_TABLET | Freq: Two times a day (BID) | ORAL | 1 refills | Status: DC
Start: 2020-04-06 — End: 2020-06-12

## 2020-04-06 NOTE — Patient Instructions (Addendum)
It was good to see you again today- take care and please keep me posted about how you are feeling If you are able to continue physical therapy at home I would do it as long as you are able I am also going to get a nurse to come out and help you- would like to work on weaning your oxygen as we are able Continue Eliquis blood thinner to 3 months - I sent in an rx for you today  Please see me in 2-3 weeks to check on how you are doing

## 2020-04-07 NOTE — Addendum Note (Signed)
Addended by: Lamar Blinks C on: 04/07/2020 12:42 PM   Modules accepted: Orders

## 2020-04-08 NOTE — Progress Notes (Addendum)
[place] [facility] [address] [place], [place] [address] [phone]  Date:  04/10/2020   Name:  Stephanie Parks   DOB:  November 22, 1945   MRN:  [id number]  PCP:  [name], MD    Chief Complaint: Follow-up   History of Present Illness:  Stephanie Parks is a 75 y.o. very pleasant female patient who presents with the following:  Following up today- she was seen for post- covid pneumonia follow-up last seek, short term follow-up requested as she had an unexpected decrease in Hg and worsening of her chest film, and hypokalemia She doubled her potassium supplement for 3 days, is now back to her normal regimen.  She had been using 2- 2.5 liters of oxygen at home  She has nail polish and it sounds as though there is some difficulty using her oxygen meter  No blood in her stool, no black stools, no blood in her urine   Her SOB is gradually getting better.  She notes that when she is moving around/walking she is not getting a short of breath She has noted some swelling of her BLE for the last few days   She uses CPAP- right now she is just using her oxygen at night as she has not been able to combine oxygen and CPAP  She saw Inocente Salles- NP- about a year ago and was dx with OSA, started on CPAP.  I will touch base with her to see if we can arrange to have her oxygen tied in with CPAP   Patient Active Problem List   Diagnosis Date Noted  . Pneumonia due to COVID-19 virus 03/12/2020  . Seasonal allergic conjunctivitis 10/04/2019  . Dysfunction of right eustachian tube 10/04/2019  . Pre-diabetes 06/02/2019  . Seasonal and perennial allergic rhinitis 12/22/2018  . Heart palpitations 12/22/2018  . History of chest pain 07/20/2018  . Brain aneurysm 07/20/2018  . Bronchitis 04/23/2018  . Tendonitis, Achilles, right 12/08/2017  . Onychomycosis 12/08/2017  . Unilateral primary osteoarthritis, left knee 10/07/2017  .  Ingrown toenail 08/10/2017  . Coughing 04/24/2017  . CAD (coronary artery disease) 03/01/2015  . Aneurysm ([place]) 03/01/2015  . Dizziness 03/01/2015  . Paresthesia of arm 03/01/2015  . Allergic rhinitis 03/01/2015  . Achalasia 09/28/2012  . CN (constipation) 09/28/2012  . Cephalalgia 08/24/2012  . HTN (hypertension) 04/19/2011  . Hyperlipidemia 04/19/2011  . GERD (gastroesophageal reflux disease) 04/19/2011  . Chest pain 04/04/2011    Past Medical History:  Diagnosis Date  . 3-vessel CAD 03/01/2015  . Achalasia 09/28/2012  . Allergic rhinitis 03/01/2015  . Anemia   . Aneurysm ([place]) 03/01/2015  . BP (high blood pressure) 03/01/2015  . Bronchitis 04/23/2018  . Cephalalgia 08/24/2012   Overview:  ICD-10 cut over    . Chest pain 04/04/2011  . CN (constipation) 09/28/2012  . Coughing 04/24/2017  . Decreased potassium in the blood 03/01/2015  . Diverticulosis   . Dizziness 03/01/2015  . GERD (gastroesophageal reflux disease)   . Hiatal hernia   . Hypercholesterolemia 03/01/2015  . Hyperlipidemia   . Hypertension   . Ingrown toenail 08/10/2017  . Inguinal hernia   . Migraine headache   . Onychomycosis 12/08/2017  . Paresthesia of arm 03/01/2015  . Schatzki's ring   . Tendonitis, Achilles, right 12/08/2017  . Unilateral primary osteoarthritis, left knee 10/07/2017    Past Surgical History:  Procedure Laterality Date  . ABDOMINAL HYSTERECTOMY    .  BACK SURGERY     x 3  . CARPAL TUNNEL RELEASE     left wrist  . CERVICAL SPINE SURGERY    . HEMORRHOID SURGERY      Social History   Tobacco Use  . Smoking status: Former Smoker    Packs/day: 0.50    Years: 20.00    Pack years: 10.00    Types: Cigarettes    Quit date: 02/19/1984    Years since quitting: 36.1  . Smokeless tobacco: Never Used  Vaping Use  . Vaping Use: Never used  Substance Use Topics  . Alcohol use: No  . Drug use: No    Family History  Problem Relation Age of Onset  . Allergic rhinitis Sister   . Diabetes  Sister   . Hypertension Sister   . Heart attack Father 71  . Heart disease Father   . Stomach cancer Maternal Grandmother   . Heart disease Mother   . Colon cancer Neg Hx   . Angioedema Neg Hx   . Asthma Neg Hx   . Eczema Neg Hx   . Urticaria Neg Hx   . Immunodeficiency Neg Hx   . Breast cancer Neg Hx   . Migraines Neg Hx   . Headache Neg Hx     Allergies  Allergen Reactions  . Shellfish Allergy Hives and Swelling  . Ace Inhibitors Other (See Comments)    Pt cannot recall her reaction but tolerates arb   . Gabapentin Other (See Comments)    headaches  . Pitavastatin     Unknown reaction   . Topiramate     Heart race,   . Sulfa Antibiotics Other (See Comments) and Rash    Fine bumps    Medication list has been reviewed and updated.  Current Outpatient Medications on File Prior to Visit  Medication Sig Dispense Refill  . albuterol (PROAIR HFA) 108 (90 Base) MCG/ACT inhaler Inhale 2 puffs into the lungs every 4 (four) hours as needed for wheezing or shortness of breath. 1 each 1  . apixaban (ELIQUIS) 5 MG TABS tablet Take 1 tablet (5 mg total) by mouth 2 (two) times daily. 60 tablet 1  . APIXABAN (ELIQUIS) VTE STARTER PACK (10MG  AND 5MG ) Take as directed on package: start with two-5mg  tablets twice daily for 7 days. On day 8, switch to one-5mg  tablet twice daily. 1 each 0  . ascorbic acid (VITAMIN C) 500 MG tablet Take 500 mg by mouth daily.    . Calcium Carb-Cholecalciferol (CALCIUM 600 + D PO) Take 1 tablet by mouth daily.    Marland Kitchen ELDERBERRY PO Take 50 mg by mouth daily.    Marland Kitchen EPINEPHrine (EPIPEN 2-PAK) 0.3 mg/0.3 mL IJ SOAJ injection Inject 0.3 mLs (0.3 mg total) into the muscle as needed for anaphylaxis. Per allergen immunotherapy protocol 1 each 2  . fluticasone (FLONASE) 50 MCG/ACT nasal spray USE 2 SPRAYS IN NOSTRIL(S) DAILY AS NEEDED.  In the right nostril point the applicator out toward your right ear.  In the left nostril point the applicator out toward her left ear  (Patient taking differently: Place 2 sprays into both nostrils daily as needed for allergies.) 16 g 9  . LOSARTAN POTASSIUM PO Take by mouth.    . metoprolol succinate (TOPROL-XL) 25 MG 24 hr tablet Take 0.5 tablets (12.5 mg total) by mouth daily. 90 tablet 1  . Multiple Vitamins-Minerals (MULTIVITAL) tablet Take 1 tablet by mouth daily.     . pantoprazole (PROTONIX) 40 MG  tablet Take 1 tablet (40 mg total) by mouth 2 (two) times daily. 180 tablet 1  . potassium chloride SA (KLOR-CON M20) 20 MEQ tablet TAKE 2 PILLS (40 MEQ) ONCE ON DAY ONE, THEN TAKE ONE DAILY AS DIRECTED BY MD 30 tablet 0  . Probiotic Product (PROBIOTIC PO) Take 1 capsule by mouth daily.    . rizatriptan (MAXALT-MLT) 10 MG disintegrating tablet Take 10 mg by mouth as needed for migraine. May repeat in 2 hours if needed    . rosuvastatin (CRESTOR) 10 MG tablet TAKE 1 TABLET BY MOUTH EVERY DAY (Patient taking differently: Take 10 mg by mouth daily.) 90 tablet 3  . TUBERCULIN SYR 1CC/27GX1/2" (MONOJECT SYRINGE 1ML 27GX1/2") 27G X 1/2" 1 ML MISC 10 each by Does not apply route once as needed for up to 1 dose. 10 each 0  . zinc gluconate 50 MG tablet Take 50 mg by mouth daily.     No current facility-administered medications on file prior to visit.    Review of Systems:  As per HPI- otherwise negative.   Physical Examination: Vitals:   04/10/20 1545 04/10/20 1655  BP: 128/60   Pulse: (!) 50 95  Temp: 98.1 F (36.7 C)   SpO2: 98% 96%   Vitals:   04/10/20 1545  Weight: 190 lb 6.4 oz (86.4 kg)  Height: 5\' 5"  (1.651 m)   Body mass index is 31.68 kg/m. Ideal Body Weight: Weight in (lb) to have BMI = 25: 149.9   GEN: no acute distress.  Overweight, overall Jalilah looks well today HEENT: Atraumatic, Normocephalic.  Ears and Nose: No external deformity. CV: RRR, No M/G/R. No JVD. No thrill. No extra heart sounds. PULM: CTA B, no wheezes, crackles, rhonchi. No retractions. No resp. distress. No accessory muscle  use. EXTR: No c/c.  She has trace to 1+ edema both lower extremities.  No tenderness of the calves PSYCH: Normally interactive. Conversant.   Using oxygen at 2 L.  I trended down to 1 and recheck sats about 5 minutes later.  No desaturation, remained about 96% Lungs sound okay with good air movement  Assessment and Plan: Hypokalemia - Plan: Basic metabolic panel  Anemia, unspecified type - Plan: CBC, CANCELED: Ferritin  Pneumonia due to COVID-19 virus - Plan: CBC  Lower extremity edema - Plan: B Nat Peptide  Following up today for recent COVID-19 pneumonia, pulmonary embolism, hypokalemia and drop in hemoglobin noticed at labs last week.  Recheck BMP and CBC today.  I will also check a BNP due to lower extremity swelling.  Patient has some shortness of breath but this is not unexpected given recent COVID-19 pneumonia and associated pulmonary embolism.  She does feel her shortness of breath is getting better She is taking her Eliquis without any difficulty Will plan further follow- up pending labs. I have asked her to be closely posted about her progress, we will plan her next step pending labs We discussed how to gradually wean her oxygen I will touch base with neurology about incorporating her oxygen with CPAP  This visit occurred during the SARS-CoV-2 public health emergency.  Safety protocols were in place, including screening questions prior to the visit, additional usage of staff PPE, and extensive cleaning of exam room while observing appropriate contact time as indicated for disinfecting solutions.   Signed Lamar Blinks, MD  Received her labs as below 2/23- called pt and LMOM.  Called her back later and was able to reach her I also got disability paperwork  for her; need to discuss exact dates of her leave.  She went out on 1/21- we will estimate RTW on 4/1 Blood counts going the right direction Continued mild low K -she has been on 20 mEq of potassium for a few years.  We are  not certain why her K tends to be low.  She is not taking any diuretics.  For the time being we will continue 20 mill equivalents daily, I also encouraged her to increase potassium rich foods such as eating a banana daily  Overall she is feeling improved, we have an appointment March 10 for recheck  Results for orders placed or performed in visit on 04/10/20  CBC  Result Value Ref Range   WBC 9.3 4.0 - 10.5 K/uL   RBC 3.66 (L) 3.87 - 5.11 Mil/uL   Platelets 431.0 (H) 150.0 - 400.0 K/uL   Hemoglobin 10.5 (L) 12.0 - 15.0 g/dL   HCT 31.9 (L) 36.0 - 46.0 %   MCV 87.1 78.0 - 100.0 fl   MCHC 32.9 30.0 - 36.0 g/dL   RDW 18.0 (H) 11.5 - 79.0 %  Basic metabolic panel  Result Value Ref Range   Sodium 143 135 - 145 mEq/L   Potassium 3.2 (L) 3.5 - 5.1 mEq/L   Chloride 101 96 - 112 mEq/L   CO2 34 (H) 19 - 32 mEq/L   Glucose, Bld 133 (H) 70 - 99 mg/dL   BUN 10 6 - 23 mg/dL   Creatinine, Ser 0.91 0.40 - 1.20 mg/dL   GFR 62.15 >60.00 mL/min   Calcium 9.8 8.4 - 10.5 mg/dL  B Nat Peptide  Result Value Ref Range   Pro B Natriuretic peptide (BNP) 44.0 0.0 - 100.0 pg/mL

## 2020-04-10 ENCOUNTER — Other Ambulatory Visit: Payer: Self-pay

## 2020-04-10 ENCOUNTER — Encounter: Payer: Self-pay | Admitting: Family Medicine

## 2020-04-10 ENCOUNTER — Ambulatory Visit (INDEPENDENT_AMBULATORY_CARE_PROVIDER_SITE_OTHER): Payer: Medicare Other | Admitting: Family Medicine

## 2020-04-10 VITALS — BP 128/60 | HR 95 | Temp 98.1°F | Ht 65.0 in | Wt 190.4 lb

## 2020-04-10 DIAGNOSIS — J1282 Pneumonia due to coronavirus disease 2019: Secondary | ICD-10-CM

## 2020-04-10 DIAGNOSIS — R600 Localized edema: Secondary | ICD-10-CM | POA: Diagnosis not present

## 2020-04-10 DIAGNOSIS — D649 Anemia, unspecified: Secondary | ICD-10-CM | POA: Diagnosis not present

## 2020-04-10 DIAGNOSIS — E876 Hypokalemia: Secondary | ICD-10-CM

## 2020-04-10 DIAGNOSIS — U071 COVID-19: Secondary | ICD-10-CM | POA: Diagnosis not present

## 2020-04-10 NOTE — Patient Instructions (Signed)
I will be in touch with your labs asap- we will check your potassium and blood counts  We can plan to repeat a chest film in a few weeks Try using the oxygen meter sideways on your finger - this will help it read through your nail polish Try decreasing your oxygen to level 1; as long as your oxygen level is 92% or greater this will allow [place] to gradually get you off the oxygen I will touch base with neurology who is admin your CPAP to see how we can combine this with your oxygen

## 2020-04-11 ENCOUNTER — Encounter: Payer: Self-pay | Admitting: *Deleted

## 2020-04-11 ENCOUNTER — Telehealth: Payer: Self-pay | Admitting: Adult Health

## 2020-04-11 ENCOUNTER — Telehealth: Payer: Self-pay

## 2020-04-11 DIAGNOSIS — G43909 Migraine, unspecified, not intractable, without status migrainosus: Secondary | ICD-10-CM | POA: Diagnosis not present

## 2020-04-11 DIAGNOSIS — Z7952 Long term (current) use of systemic steroids: Secondary | ICD-10-CM | POA: Diagnosis not present

## 2020-04-11 DIAGNOSIS — I671 Cerebral aneurysm, nonruptured: Secondary | ICD-10-CM | POA: Diagnosis not present

## 2020-04-11 DIAGNOSIS — G4733 Obstructive sleep apnea (adult) (pediatric): Secondary | ICD-10-CM

## 2020-04-11 DIAGNOSIS — H6991 Unspecified Eustachian tube disorder, right ear: Secondary | ICD-10-CM | POA: Diagnosis not present

## 2020-04-11 DIAGNOSIS — K219 Gastro-esophageal reflux disease without esophagitis: Secondary | ICD-10-CM | POA: Diagnosis not present

## 2020-04-11 DIAGNOSIS — G9341 Metabolic encephalopathy: Secondary | ICD-10-CM | POA: Diagnosis not present

## 2020-04-11 DIAGNOSIS — K5790 Diverticulosis of intestine, part unspecified, without perforation or abscess without bleeding: Secondary | ICD-10-CM | POA: Diagnosis not present

## 2020-04-11 DIAGNOSIS — K5900 Constipation, unspecified: Secondary | ICD-10-CM | POA: Diagnosis not present

## 2020-04-11 DIAGNOSIS — E785 Hyperlipidemia, unspecified: Secondary | ICD-10-CM | POA: Diagnosis not present

## 2020-04-11 DIAGNOSIS — I2510 Atherosclerotic heart disease of native coronary artery without angina pectoris: Secondary | ICD-10-CM | POA: Diagnosis not present

## 2020-04-11 DIAGNOSIS — M1712 Unilateral primary osteoarthritis, left knee: Secondary | ICD-10-CM | POA: Diagnosis not present

## 2020-04-11 DIAGNOSIS — H43393 Other vitreous opacities, bilateral: Secondary | ICD-10-CM | POA: Diagnosis not present

## 2020-04-11 DIAGNOSIS — J1282 Pneumonia due to coronavirus disease 2019: Secondary | ICD-10-CM | POA: Diagnosis not present

## 2020-04-11 DIAGNOSIS — I10 Essential (primary) hypertension: Secondary | ICD-10-CM | POA: Diagnosis not present

## 2020-04-11 DIAGNOSIS — K222 Esophageal obstruction: Secondary | ICD-10-CM | POA: Diagnosis not present

## 2020-04-11 DIAGNOSIS — U071 COVID-19: Secondary | ICD-10-CM | POA: Diagnosis not present

## 2020-04-11 DIAGNOSIS — D649 Anemia, unspecified: Secondary | ICD-10-CM | POA: Diagnosis not present

## 2020-04-11 DIAGNOSIS — J302 Other seasonal allergic rhinitis: Secondary | ICD-10-CM | POA: Diagnosis not present

## 2020-04-11 DIAGNOSIS — Z7901 Long term (current) use of anticoagulants: Secondary | ICD-10-CM | POA: Diagnosis not present

## 2020-04-11 DIAGNOSIS — M7661 Achilles tendinitis, right leg: Secondary | ICD-10-CM | POA: Diagnosis not present

## 2020-04-11 DIAGNOSIS — R7309 Other abnormal glucose: Secondary | ICD-10-CM | POA: Diagnosis not present

## 2020-04-11 DIAGNOSIS — Z7951 Long term (current) use of inhaled steroids: Secondary | ICD-10-CM | POA: Diagnosis not present

## 2020-04-11 DIAGNOSIS — J9601 Acute respiratory failure with hypoxia: Secondary | ICD-10-CM | POA: Diagnosis not present

## 2020-04-11 DIAGNOSIS — I2699 Other pulmonary embolism without acute cor pulmonale: Secondary | ICD-10-CM | POA: Diagnosis not present

## 2020-04-11 LAB — BASIC METABOLIC PANEL
BUN: 10 mg/dL (ref 6–23)
CO2: 34 mEq/L — ABNORMAL HIGH (ref 19–32)
Calcium: 9.8 mg/dL (ref 8.4–10.5)
Chloride: 101 mEq/L (ref 96–112)
Creatinine, Ser: 0.91 mg/dL (ref 0.40–1.20)
GFR: 62.15 mL/min (ref >60.00)
Glucose, Bld: 133 mg/dL — ABNORMAL HIGH (ref 70–99)
Potassium: 3.2 mEq/L — ABNORMAL LOW (ref 3.5–5.1)
Sodium: 143 mEq/L (ref 135–145)

## 2020-04-11 LAB — CBC
HCT: 31.9 % — ABNORMAL LOW (ref 36.0–46.0)
Hemoglobin: 10.5 g/dL — ABNORMAL LOW (ref 12.0–15.0)
MCHC: 32.9 g/dL (ref 30.0–36.0)
MCV: 87.1 fl (ref 78.0–100.0)
Platelets: 431 10*3/uL — ABNORMAL HIGH (ref 150.0–400.0)
RBC: 3.66 Mil/uL — ABNORMAL LOW (ref 3.87–5.11)
RDW: 18 % — ABNORMAL HIGH (ref 11.5–15.5)
WBC: 9.3 10*3/uL (ref 4.0–10.5)

## 2020-04-11 LAB — BRAIN NATRIURETIC PEPTIDE: Pro B Natriuretic peptide (BNP): 44 pg/mL (ref 0.0–100.0)

## 2020-04-11 NOTE — Telephone Encounter (Signed)
Thank you :)

## 2020-04-11 NOTE — Telephone Encounter (Signed)
-----   Message from Althea Charon, [place] sent at 04/04/2020  4:28 PM EST ----- Please refer to pulmonary due to history of COVID-19 pneumonia and history of restrictive lung disease

## 2020-04-11 NOTE — Telephone Encounter (Signed)
I called and spoke to the patient to confirm which location the patient would prefer to go to. Patient decided [place] would be closer for her.   I have placed a referral to [facility] in [place].  Their office requires the referral to review before scheduling. They will contact the patient to get scheduled.   Patient has been informed of this information.      Pulmonology - [facility] [address] [place], [place] [address] [address] [phone]

## 2020-04-11 NOTE — Telephone Encounter (Signed)
I sent CM but also faxed to [facility] [phone] with order for oxygen to be added.

## 2020-04-11 NOTE — Patient Outreach (Addendum)
[facility]) Care Management  04/11/2020  Stephanie Parks 1945-04-20 [id number]   This is a late entry for yesterday 04/10/20.  Incoming call from pt reporting perceived concerning VS that she is checking multiple times this am. BP initially was 140/70, pulse 79. Then recheck her values went up slightly 146/76, pulse 80, then up a bit more 152/80, pulse 89. She is very anxious and worried.  Reassured pt that her numbers were initially fine. Acknowledged observation that when pt starts checking herself more frequently, she becomes more anxious and the whole process increases her BP and pulse rate but not to a dangerous level  Counseled pt on normal values and recognizing that she is getting herself worked up and this is making matters worse. She acknowledges that she knows this is true.  Advised that when she notes higher level of anxiety to STOP, take a deep breath and breath deeply and slowly while sitting down and relaxing. She may want to pray, listen to some calming music, day dream about something pleasurable, go for a walk.  She reports she is going to see Dr. Edilia Bo today. Advised she report her ongoing anxiety to Dr. Edilia Bo.  NP will be transitioning Stephanie Parks over to the newly embedded care manager, Stephanie Parks after my call to her on the 24th.  Goals Addressed            This Visit's Progress   . Learn More About My Health as evidenced by pt being able to repeat instructions over the next month.       Follow Up Date  04/18/20  - make a list of questions - ask questions - repeat what I heard to make sure I understand - speak up when I don't understand    Why is this important?   The best way to learn about your health and care is by talking to the doctor and nurse.  They will answer your questions and give you information in the way that you like best.    Notes: Stephanie Parks always has questions. Have encouraged her to write her questions down when  they occur to her and they she can present this list to her provider when she is in the office. Her MD also communicates by my chart. 03/23/20 Pt is always learning new ways to manage her health. Recently she was diagnosed with COVID and had the complication of a PE. She is now on O2 which is new and taking Eliquis for the PE. She has been given education on both of these problems throughout her hospitalization but will need reinforcement to wear her O2 and to take her medication exactly the way it was ordered. Advised to write her questions down so she can remember when she is with her MD. 03/30/20 Pt reports she will see her MD on Monday. Reinforced for her to make her question list for the appt and reminded her she can always call her nurse. 222/22 Coached pt on relaxing techniques to reduce her anxiety which she has frequently with numerous minor complaints. Will ask Dr. Edilia Bo to consider an antianxiety agent as this is an ongoing problem and it escalates her need for unneccessary medical calls to report.      We agreed to talk at the end of the week.  Stephanie Parks. Stephanie Neither, MSN, [facility] Gerontological Nurse Practitioner [facility] Care Management [phone]

## 2020-04-11 NOTE — Telephone Encounter (Signed)
-----   Message from [name], MD sent at 04/10/2020  4:58 PM EST ----- Hi Rigdon Macomber-this is a mutual patient.  She recently had COVID-19 with associated pulmonary embolism, and she is currently on oxygen at home.  We hope this will be temporary!  She is also on CPAP, has not been using her CPAP recently due to her oxygen.  We wondered if your team might feel to help her incorporate oxygen with her CPAP therapy?  I was not sure how to help her with this  Thank you so much Jess Copland

## 2020-04-11 NOTE — Telephone Encounter (Signed)
Parks, Stephanie  Abid Bolla S, RN Thank you, will process.   

## 2020-04-12 ENCOUNTER — Telehealth: Payer: Self-pay | Admitting: Family Medicine

## 2020-04-12 DIAGNOSIS — G4733 Obstructive sleep apnea (adult) (pediatric): Secondary | ICD-10-CM | POA: Diagnosis not present

## 2020-04-12 MED ORDER — POTASSIUM CHLORIDE CRYS ER 20 MEQ PO TBCR: EXTENDED_RELEASE_TABLET | ORAL | 1 refills | Status: DC

## 2020-04-12 NOTE — Telephone Encounter (Signed)
Received leave of absence form we requested on the phone the other week when I called [facility]. Placed in folder for review and signature.

## 2020-04-12 NOTE — Addendum Note (Signed)
Addended by: [name] on: 04/12/2020 07:31 PM   Modules accepted: Orders

## 2020-04-12 NOTE — Telephone Encounter (Signed)
Forms faxed into front office. Placed into copland folder

## 2020-04-13 ENCOUNTER — Other Ambulatory Visit: Payer: Self-pay | Admitting: *Deleted

## 2020-04-13 DIAGNOSIS — I10 Essential (primary) hypertension: Secondary | ICD-10-CM | POA: Diagnosis not present

## 2020-04-13 DIAGNOSIS — R7309 Other abnormal glucose: Secondary | ICD-10-CM | POA: Diagnosis not present

## 2020-04-13 DIAGNOSIS — H43393 Other vitreous opacities, bilateral: Secondary | ICD-10-CM | POA: Diagnosis not present

## 2020-04-13 DIAGNOSIS — I2510 Atherosclerotic heart disease of native coronary artery without angina pectoris: Secondary | ICD-10-CM | POA: Diagnosis not present

## 2020-04-13 DIAGNOSIS — K5900 Constipation, unspecified: Secondary | ICD-10-CM | POA: Diagnosis not present

## 2020-04-13 DIAGNOSIS — H6991 Unspecified Eustachian tube disorder, right ear: Secondary | ICD-10-CM | POA: Diagnosis not present

## 2020-04-13 DIAGNOSIS — Z7952 Long term (current) use of systemic steroids: Secondary | ICD-10-CM | POA: Diagnosis not present

## 2020-04-13 DIAGNOSIS — K5790 Diverticulosis of intestine, part unspecified, without perforation or abscess without bleeding: Secondary | ICD-10-CM | POA: Diagnosis not present

## 2020-04-13 DIAGNOSIS — Z7951 Long term (current) use of inhaled steroids: Secondary | ICD-10-CM | POA: Diagnosis not present

## 2020-04-13 DIAGNOSIS — M7661 Achilles tendinitis, right leg: Secondary | ICD-10-CM | POA: Diagnosis not present

## 2020-04-13 DIAGNOSIS — E785 Hyperlipidemia, unspecified: Secondary | ICD-10-CM | POA: Diagnosis not present

## 2020-04-13 DIAGNOSIS — M1712 Unilateral primary osteoarthritis, left knee: Secondary | ICD-10-CM | POA: Diagnosis not present

## 2020-04-13 DIAGNOSIS — U071 COVID-19: Secondary | ICD-10-CM | POA: Diagnosis not present

## 2020-04-13 DIAGNOSIS — I671 Cerebral aneurysm, nonruptured: Secondary | ICD-10-CM | POA: Diagnosis not present

## 2020-04-13 DIAGNOSIS — D649 Anemia, unspecified: Secondary | ICD-10-CM | POA: Diagnosis not present

## 2020-04-13 DIAGNOSIS — J1282 Pneumonia due to coronavirus disease 2019: Secondary | ICD-10-CM | POA: Diagnosis not present

## 2020-04-13 DIAGNOSIS — G9341 Metabolic encephalopathy: Secondary | ICD-10-CM | POA: Diagnosis not present

## 2020-04-13 DIAGNOSIS — I2699 Other pulmonary embolism without acute cor pulmonale: Secondary | ICD-10-CM | POA: Diagnosis not present

## 2020-04-13 DIAGNOSIS — K222 Esophageal obstruction: Secondary | ICD-10-CM | POA: Diagnosis not present

## 2020-04-13 DIAGNOSIS — J302 Other seasonal allergic rhinitis: Secondary | ICD-10-CM | POA: Diagnosis not present

## 2020-04-13 DIAGNOSIS — J9601 Acute respiratory failure with hypoxia: Secondary | ICD-10-CM | POA: Diagnosis not present

## 2020-04-13 DIAGNOSIS — Z7901 Long term (current) use of anticoagulants: Secondary | ICD-10-CM | POA: Diagnosis not present

## 2020-04-13 DIAGNOSIS — K219 Gastro-esophageal reflux disease without esophagitis: Secondary | ICD-10-CM | POA: Diagnosis not present

## 2020-04-13 DIAGNOSIS — G43909 Migraine, unspecified, not intractable, without status migrainosus: Secondary | ICD-10-CM | POA: Diagnosis not present

## 2020-04-13 NOTE — Telephone Encounter (Signed)
I have discussed with pt and completed forms.  Mel Almond, please print out her last OV note and also note from 03/29/20 and fax along with forms, TY

## 2020-04-13 NOTE — Telephone Encounter (Signed)
Forms and OV notes have been faxed for FMLA. Copy sent to scan.

## 2020-04-13 NOTE — Patient Outreach (Signed)
[facility]) Care Management  04/13/2020  Stephanie Parks 1945-08-04 [id number]  Telphone outreach to follow up on pt anxiety.  [name] says she is having a good day today. She has found she can go without her O2 during the day. She has been checking it with her pulse Ox finger monitor. At this time she is only wearing her O2 and not her CPAP at night because she doesn't have an adaptor to add the O2 to the CPAP. [name] has ordered this. Pt is to call me if she hasn't received it by Monday.  Advised pt of upcoming transition to an embedded office care manager, Thea Silversmith, RN. 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  I will schedule my next appt upon collaborating with Denton Brick for a date for smooth transition.  [name]. [name], MSN, [facility] Gerontological Nurse Practitioner [facility] Care Management [phone]

## 2020-04-16 ENCOUNTER — Encounter: Payer: Self-pay | Admitting: Family Medicine

## 2020-04-16 ENCOUNTER — Other Ambulatory Visit: Payer: Self-pay | Admitting: Family Medicine

## 2020-04-16 DIAGNOSIS — U071 COVID-19: Secondary | ICD-10-CM

## 2020-04-16 DIAGNOSIS — J1282 Pneumonia due to coronavirus disease 2019: Secondary | ICD-10-CM

## 2020-04-16 DIAGNOSIS — I10 Essential (primary) hypertension: Secondary | ICD-10-CM

## 2020-04-17 DIAGNOSIS — M1712 Unilateral primary osteoarthritis, left knee: Secondary | ICD-10-CM | POA: Diagnosis not present

## 2020-04-17 DIAGNOSIS — Z7901 Long term (current) use of anticoagulants: Secondary | ICD-10-CM | POA: Diagnosis not present

## 2020-04-17 DIAGNOSIS — J302 Other seasonal allergic rhinitis: Secondary | ICD-10-CM | POA: Diagnosis not present

## 2020-04-17 DIAGNOSIS — E785 Hyperlipidemia, unspecified: Secondary | ICD-10-CM | POA: Diagnosis not present

## 2020-04-17 DIAGNOSIS — J9601 Acute respiratory failure with hypoxia: Secondary | ICD-10-CM | POA: Diagnosis not present

## 2020-04-17 DIAGNOSIS — K219 Gastro-esophageal reflux disease without esophagitis: Secondary | ICD-10-CM | POA: Diagnosis not present

## 2020-04-17 DIAGNOSIS — G43909 Migraine, unspecified, not intractable, without status migrainosus: Secondary | ICD-10-CM | POA: Diagnosis not present

## 2020-04-17 DIAGNOSIS — K5790 Diverticulosis of intestine, part unspecified, without perforation or abscess without bleeding: Secondary | ICD-10-CM | POA: Diagnosis not present

## 2020-04-17 DIAGNOSIS — I2699 Other pulmonary embolism without acute cor pulmonale: Secondary | ICD-10-CM | POA: Diagnosis not present

## 2020-04-17 DIAGNOSIS — G9341 Metabolic encephalopathy: Secondary | ICD-10-CM | POA: Diagnosis not present

## 2020-04-17 DIAGNOSIS — D649 Anemia, unspecified: Secondary | ICD-10-CM | POA: Diagnosis not present

## 2020-04-17 DIAGNOSIS — U071 COVID-19: Secondary | ICD-10-CM | POA: Diagnosis not present

## 2020-04-17 DIAGNOSIS — I2510 Atherosclerotic heart disease of native coronary artery without angina pectoris: Secondary | ICD-10-CM | POA: Diagnosis not present

## 2020-04-17 DIAGNOSIS — Z7951 Long term (current) use of inhaled steroids: Secondary | ICD-10-CM | POA: Diagnosis not present

## 2020-04-17 DIAGNOSIS — R7309 Other abnormal glucose: Secondary | ICD-10-CM | POA: Diagnosis not present

## 2020-04-17 DIAGNOSIS — J1282 Pneumonia due to coronavirus disease 2019: Secondary | ICD-10-CM | POA: Diagnosis not present

## 2020-04-17 DIAGNOSIS — H43393 Other vitreous opacities, bilateral: Secondary | ICD-10-CM | POA: Diagnosis not present

## 2020-04-17 DIAGNOSIS — H6991 Unspecified Eustachian tube disorder, right ear: Secondary | ICD-10-CM | POA: Diagnosis not present

## 2020-04-17 DIAGNOSIS — I671 Cerebral aneurysm, nonruptured: Secondary | ICD-10-CM | POA: Diagnosis not present

## 2020-04-17 DIAGNOSIS — Z7952 Long term (current) use of systemic steroids: Secondary | ICD-10-CM | POA: Diagnosis not present

## 2020-04-17 DIAGNOSIS — K5900 Constipation, unspecified: Secondary | ICD-10-CM | POA: Diagnosis not present

## 2020-04-17 DIAGNOSIS — M7661 Achilles tendinitis, right leg: Secondary | ICD-10-CM | POA: Diagnosis not present

## 2020-04-17 DIAGNOSIS — K222 Esophageal obstruction: Secondary | ICD-10-CM | POA: Diagnosis not present

## 2020-04-17 DIAGNOSIS — I10 Essential (primary) hypertension: Secondary | ICD-10-CM | POA: Diagnosis not present

## 2020-04-17 NOTE — Telephone Encounter (Signed)
-----   Message from Deloria Lair, NP sent at 04/13/2020  5:20 PM EST ----- Regarding: RE: anti-anxiety med Dr. Edilia Bo when she calls me at times she is frantic about her BP, her pulse on some days. She works herself up in a frenzy and then her BP and Pulse do get up. Most of her ED visits since I've known her were, in my view, related to anxiety and jumping to go to the ED instead of calling you. I have discussed this with her at length... you can read through my notes. I understand your reasons for no benzo but maybe something else.Marland KitchenMarland KitchenBuspirone????  ----- Message ----- From: [name], MD Sent: 04/13/2020   1:41 PM EST To: Deloria Lair, NP Subject: RE: anti-anxiety med                           Suzi Roots- thanks for the note!  I spoke with Joycelyn Schmid yesterday and she seemed to be doing well.  I am not eager to add a benzo to her regimen esp with her age and sleep apnea/ current oxygen requirement unless she is really struggling with anxiety. Has she been complaining of a lot of anxiety? Best JC ----- Message ----- From: Deloria Lair, NP Sent: 04/13/2020  12:02 PM EST To: [name], MD Subject: anti-anxiety med                               Sorry for the second message. Talked to Renesmae this am, she is having a good morning! I know we don't usually like to prescribe benzos but I'm thinking maybe a bid lorazepam 0.5 bid with an option to take a mid afternoon may be best for her. What do you think?   I will be transitioning [place] to your office's new embedded care manager, Thea Silversmith. I will fill Denton Brick in and we will make a smooth transition.  Shane Crutch

## 2020-04-17 NOTE — Telephone Encounter (Signed)
Full-timeCall patient to discuss. She admits that yesterday she felt a headache, she then proceeded to check her blood pressure multiple times and was getting quite anxious about readings of approximately 150/80. Advised her that very frequent blood pressure checks are not necessary and may increase her anxiety. Also advised her that pressures in the 150/80 range are not dangerous in the short-term  I encouraged her to avoid checking her blood pressure multiple times as this may increase anxiety. We discussed anxiety today, I encouraged her to think about medication to treat this condition. At this point she is not interested in medication but she will think about it

## 2020-04-18 ENCOUNTER — Telehealth: Payer: Self-pay | Admitting: *Deleted

## 2020-04-18 ENCOUNTER — Other Ambulatory Visit: Payer: Self-pay | Admitting: *Deleted

## 2020-04-18 NOTE — Patient Outreach (Signed)
[facility]) Care Management  [place]  04/18/2020   ABRIELLE FINCK 07-15-1945 [id number]  Subjective: Telephone outreach, final call and transition to embedded care manager, Thea Silversmith, RN.  Discussed her anxiety level and her discussion with Dr. Edilia Bo. Suggested pt keep a diary of her daily feelings of anxiety, perhaps on scale of 1-10 or none, mild, moderate and severe. What were the circumstances? How did you feel? What was your pulse or BP if checked. Did it prevent you from doing something that was on you schedule. Did you practice relaxation, deep breathing? Meditate: live for today, can't do anything about yesterday or tomorrow. Is the problem something you have any control over?   Encounter Medications:  Outpatient Encounter Medications as of 04/18/2020  Medication Sig Note  . albuterol (PROAIR HFA) 108 (90 Base) MCG/ACT inhaler Inhale 2 puffs into the lungs every 4 (four) hours as needed for wheezing or shortness of breath.   Marland Kitchen apixaban (ELIQUIS) 5 MG TABS tablet Take 1 tablet (5 mg total) by mouth 2 (two) times daily.   . APIXABAN (ELIQUIS) VTE STARTER PACK (10MG  AND 5MG ) Take as directed on package: start with two-5mg  tablets twice daily for 7 days. On day 8, switch to one-5mg  tablet twice daily.   Marland Kitchen ascorbic acid (VITAMIN C) 500 MG tablet Take 500 mg by mouth daily.   . Calcium Carb-Cholecalciferol (CALCIUM 600 + D PO) Take 1 tablet by mouth daily.   Marland Kitchen ELDERBERRY PO Take 50 mg by mouth daily.   Marland Kitchen EPINEPHrine (EPIPEN 2-PAK) 0.3 mg/0.3 mL IJ SOAJ injection Inject 0.3 mLs (0.3 mg total) into the muscle as needed for anaphylaxis. Per allergen immunotherapy protocol   . fluticasone (FLONASE) 50 MCG/ACT nasal spray USE 2 SPRAYS IN NOSTRIL(S) DAILY AS NEEDED.  In the right nostril point the applicator out toward your right ear.  In the left nostril point the applicator out toward her left ear (Patient taking differently: Place 2 sprays into both nostrils  daily as needed for allergies.)   . LOSARTAN POTASSIUM PO Take by mouth.   . metoprolol succinate (TOPROL-XL) 25 MG 24 hr tablet Take 0.5 tablets (12.5 mg total) by mouth daily.   . Multiple Vitamins-Minerals (MULTIVITAL) tablet Take 1 tablet by mouth daily.    . pantoprazole (PROTONIX) 40 MG tablet Take 1 tablet (40 mg total) by mouth 2 (two) times daily.   . potassium chloride SA (KLOR-CON M20) 20 MEQ tablet Take one daily   . Probiotic Product (PROBIOTIC PO) Take 1 capsule by mouth daily.   . rizatriptan (MAXALT-MLT) 10 MG disintegrating tablet Take 10 mg by mouth as needed for migraine. May repeat in 2 hours if needed   . rosuvastatin (CRESTOR) 10 MG tablet TAKE 1 TABLET BY MOUTH EVERY DAY (Patient taking differently: Take 10 mg by mouth daily.)   . TUBERCULIN SYR 1CC/27GX1/2" (MONOJECT SYRINGE 1ML 27GX1/2") 27G X 1/2" 1 ML MISC 10 each by Does not apply route once as needed for up to 1 dose. 03/13/2020: Not found in Fill Hx   . zinc gluconate 50 MG tablet Take 50 mg by mouth daily.    No facility-administered encounter medications on file as of 04/18/2020.    Functional Status:  In your present state of health, do you have any difficulty performing the following activities: 04/13/2020 03/13/2020  Hearing? - N  Vision? - N  Difficulty concentrating or making decisions? - N  Walking or climbing stairs? - N  Dressing or  bathing? - N  Doing errands, shopping? - N  Conservation officer, nature and eating ? N -  Using the Toilet? N -  In the past six months, have you accidently leaked urine? N -  Do you have problems with loss of bowel control? N -  Managing your Medications? N -  Managing your Finances? N -  Housekeeping or managing your Housekeeping? N -  Some recent data might be hidden    Fall/Depression Screening: Fall Risk  04/13/2020 08/20/2017 08/15/2016  Falls in the past year? 0 No No  Number falls in past yr: 0 - -  Injury with Fall? 0 - -  Risk for fall due to : Medication side effect - -   Follow up Falls evaluation completed - -   PHQ 2/9 Scores 04/10/2020 12/08/2018 08/20/2017 08/15/2016 10/09/2015  PHQ - 2 Score 3 4 0 0 0  PHQ- 9 Score 6 9 - - -    Assessment: Anxiety                         HTN Goals Addressed            This Visit's Progress   . COMPLETED: Learn More About My Health as evidenced by pt being able to repeat instructions over the next month.       Follow Up Date  04/27/20 - by MD  Start a diary for keeping track of your daily mood and anxiety fluctuations. Take diary to your appt on 04/27/20 Give examples of when you felt extremely stressed when it has effected your mood all day or made is so you did not want to participate in your planned activities.   Notes: Ms. Karen always has questions. Have encouraged her to write her questions down when they occur to her and they she can present this list to her provider when she is in the office. Her MD also communicates by my chart. 03/23/20 Pt is always learning new ways to manage her health. Recently she was diagnosed with COVID and had the complication of a PE. She is now on O2 which is new and taking Eliquis for the PE. She has been given education on both of these problems throughout her hospitalization but will need reinforcement to wear her O2 and to take her medication exactly the way it was ordered. Advised to write her questions down so she can remember when she is with her MD. 03/30/20 Pt reports she will see her MD on Monday. Reinforced for her to make her question list for the appt and reminded her she can always call her nurse. 222/22 Coached pt on relaxing techniques to reduce her anxiety which she has frequently with numerous minor complaints. Will ask Dr. Edilia Bo to consider an antianxiety agent as this is an ongoing problem and it escalates her need for unneccessary medical calls to report. 04/19/20 Pt has appt with Dr. Edilia Bo on 04/27/20 to discuss her anxiety. We discussed the above tasks for her to  complete and to share with Dr. Edilia Bo and continuing on with her new care manager.        Plan: Last call for this care manager. Transitioning to embedded care manager, Thea Silversmith, MD  Follow-up:  Patient agrees to Care Plan and Follow-up. Advised contact to be made within 10 days.  [name]. [name], MSN, [facility] Gerontological Nurse Practitioner [facility] Care Management [phone]

## 2020-04-18 NOTE — Chronic Care Management (AMB) (Signed)
  Chronic Care Management   Note  04/18/2020 Name: Stephanie Parks MRN: [id number] DOB: 24-Aug-1945  Stephanie Parks is a 75 y.o. year old female who is a primary care patient of Copland, Gay Filler, MD. I reached out to Stephanie Parks by phone today in response to a referral sent by Ms. Katriel A Ollis's PCP, Copland, Gay Filler, MD.  Ms. Rochon was given information about Chronic Care Management services today including:  1. CCM service includes personalized support from designated clinical staff supervised by her physician, including individualized plan of care and coordination with other care providers 2. 24/7 contact phone numbers for assistance for urgent and routine care needs. 3. Service will only be billed when office clinical staff spend 20 minutes or more in a month to coordinate care. 4. Only one practitioner may furnish and bill the service in a calendar month. 5. The patient may stop CCM services at any time (effective at the end of the month) by phone call to the office staff. 6. The patient will be responsible for cost sharing (co-pay) of up to 20% of the service fee (after annual deductible is met).  Patient agreed to services and verbal consent obtained.   Follow up plan: Telephone appointment with care management team member scheduled for: 04/25/2020  [place] Management

## 2020-04-19 DIAGNOSIS — H25813 Combined forms of age-related cataract, bilateral: Secondary | ICD-10-CM | POA: Diagnosis not present

## 2020-04-19 DIAGNOSIS — H04123 Dry eye syndrome of bilateral lacrimal glands: Secondary | ICD-10-CM | POA: Diagnosis not present

## 2020-04-19 DIAGNOSIS — H11823 Conjunctivochalasis, bilateral: Secondary | ICD-10-CM | POA: Diagnosis not present

## 2020-04-19 DIAGNOSIS — U071 COVID-19: Secondary | ICD-10-CM | POA: Diagnosis not present

## 2020-04-20 ENCOUNTER — Other Ambulatory Visit: Payer: Self-pay

## 2020-04-20 ENCOUNTER — Emergency Department (HOSPITAL_BASED_OUTPATIENT_CLINIC_OR_DEPARTMENT_OTHER): Payer: Medicare Other

## 2020-04-20 ENCOUNTER — Encounter (HOSPITAL_BASED_OUTPATIENT_CLINIC_OR_DEPARTMENT_OTHER): Payer: Self-pay

## 2020-04-20 ENCOUNTER — Emergency Department (HOSPITAL_BASED_OUTPATIENT_CLINIC_OR_DEPARTMENT_OTHER)
Admission: EM | Admit: 2020-04-20 | Discharge: 2020-04-20 | Disposition: A | Discharge status: Home / Self Care | Payer: Medicare Other | Attending: Emergency Medicine | Admitting: Emergency Medicine

## 2020-04-20 DIAGNOSIS — Z8616 Personal history of COVID-19: Secondary | ICD-10-CM | POA: Diagnosis not present

## 2020-04-20 DIAGNOSIS — R42 Dizziness and giddiness: Secondary | ICD-10-CM | POA: Diagnosis not present

## 2020-04-20 DIAGNOSIS — Z79899 Other long term (current) drug therapy: Secondary | ICD-10-CM | POA: Diagnosis not present

## 2020-04-20 DIAGNOSIS — E876 Hypokalemia: Secondary | ICD-10-CM

## 2020-04-20 DIAGNOSIS — Z7901 Long term (current) use of anticoagulants: Secondary | ICD-10-CM | POA: Diagnosis not present

## 2020-04-20 DIAGNOSIS — Z87891 Personal history of nicotine dependence: Secondary | ICD-10-CM | POA: Diagnosis not present

## 2020-04-20 DIAGNOSIS — I10 Essential (primary) hypertension: Secondary | ICD-10-CM | POA: Diagnosis not present

## 2020-04-20 DIAGNOSIS — I2510 Atherosclerotic heart disease of native coronary artery without angina pectoris: Secondary | ICD-10-CM | POA: Insufficient documentation

## 2020-04-20 LAB — CBC
HCT: 32.1 % — ABNORMAL LOW (ref 36.0–46.0)
Hemoglobin: 10.5 g/dL — ABNORMAL LOW (ref 12.0–15.0)
MCH: 29.2 pg (ref 26.0–34.0)
MCHC: 32.7 g/dL (ref 30.0–36.0)
MCV: 89.2 fL (ref 80.0–100.0)
Platelets: 388 10*3/uL (ref 150–400)
RBC: 3.6 MIL/uL — ABNORMAL LOW (ref 3.87–5.11)
RDW: 18.3 % — ABNORMAL HIGH (ref 11.5–15.5)
WBC: 8.1 10*3/uL (ref 4.0–10.5)
nRBC: 0 % (ref 0.0–0.2)

## 2020-04-20 LAB — BASIC METABOLIC PANEL
Anion gap: 10 (ref 5–15)
BUN: 9 mg/dL (ref 8–23)
CO2: 28 mmol/L (ref 22–32)
Calcium: 8.7 mg/dL — ABNORMAL LOW (ref 8.9–10.3)
Chloride: 104 mmol/L (ref 98–111)
Creatinine, Ser: 0.8 mg/dL (ref 0.44–1.00)
GFR, Estimated: >60 mL/min (ref >60)
Glucose, Bld: 108 mg/dL — ABNORMAL HIGH (ref 70–99)
Potassium: 3 mmol/L — ABNORMAL LOW (ref 3.5–5.1)
Sodium: 142 mmol/L (ref 135–145)

## 2020-04-20 LAB — TROPONIN I (HIGH SENSITIVITY): Troponin I (High Sensitivity): 4 ng/L (ref <18)

## 2020-04-20 MED ORDER — POTASSIUM CHLORIDE 10 MEQ/100ML IV SOLN
10.0000 meq | Freq: Once | INTRAVENOUS | Status: AC
  Administered 2020-04-20: 10 meq via INTRAVENOUS
  Filled 2020-04-20: qty 100

## 2020-04-20 MED ORDER — SODIUM CHLORIDE 0.9 % IV SOLN: INTRAVENOUS | Status: DC | PRN

## 2020-04-20 MED ORDER — POTASSIUM CHLORIDE CRYS ER 20 MEQ PO TBCR: 20.0000 meq | EXTENDED_RELEASE_TABLET | Freq: Two times a day (BID) | ORAL | 0 refills | Status: DC

## 2020-04-20 NOTE — ED Notes (Signed)
Pt. Walked in unit with assistance with [employer].  Pt. Reports some dizziness but ambulates with no stumbling.  Pt. Alert and oriented and back to the room and place in stretcher.  Pt. In no distress.

## 2020-04-20 NOTE — ED Notes (Signed)
Patient is resting comfortably. 

## 2020-04-20 NOTE — ED Notes (Signed)
Pt. Reports she has had blood pressure problems today.  Pt. In no distress and is having no blood pressure problems right now .

## 2020-04-20 NOTE — Discharge Instructions (Signed)
Increase the potassium to twice a day for the next 5 days.  Then go back to the normal dosing.  Follow-up with your doctor.  Your doctor can also adjust your blood pressure medicines if needed.  This does not appear to be a stroke or a heart attack causing the symptoms.

## 2020-04-20 NOTE — ED Triage Notes (Signed)
Pt noted high B/P this AM and was evaluated at [facility] and sent home. At 17:00 sudden onset of dizziness and L arm pain.

## 2020-04-20 NOTE — ED Notes (Signed)
EDP notified of pt's symptoms

## 2020-04-20 NOTE — ED Provider Notes (Signed)
[facility] EMERGENCY DEPARTMENT Provider Note   CSN: [id number] Arrival date & time: 04/20/20  1745     History Chief Complaint  Patient presents with  . Hypertension    Stephanie Parks is a 75 y.o. female.  HPI Patient presents with dizziness and strange feeling in her left arm. Says that it began at about 5pm tonight. She states that her blood pressure was elevated this morning. It had improved. She said that she began to feel a dizziness in the left side of her head. It felt as if things were moving around. No headache.  She states that it was unsteady while she was trying to walk here.  No difficulty moving the left or right necessarily but felt a little unsteady.  No vision changes.  No chest pain.  States she is worried because she has had difficulty controlling her blood pressure recently.  Patient is on Eliquis for recent pulmonary embolism.    Past Medical History:  Diagnosis Date  . 3-vessel CAD 03/01/2015  . Achalasia 09/28/2012  . Allergic rhinitis 03/01/2015  . Anemia   . Aneurysm ([place]) 03/01/2015  . BP (high blood pressure) 03/01/2015  . Bronchitis 04/23/2018  . Cephalalgia 08/24/2012   Overview:  ICD-10 cut over    . Chest pain 04/04/2011  . CN (constipation) 09/28/2012  . Coughing 04/24/2017  . Decreased potassium in the blood 03/01/2015  . Diverticulosis   . Dizziness 03/01/2015  . GERD (gastroesophageal reflux disease)   . Hiatal hernia   . Hypercholesterolemia 03/01/2015  . Hyperlipidemia   . Hypertension   . Ingrown toenail 08/10/2017  . Inguinal hernia   . Migraine headache   . Onychomycosis 12/08/2017  . Paresthesia of arm 03/01/2015  . Schatzki's ring   . Tendonitis, Achilles, right 12/08/2017  . Unilateral primary osteoarthritis, left knee 10/07/2017    Patient Active Problem List   Diagnosis Date Noted  . Pneumonia due to COVID-19 virus 03/12/2020  . Seasonal allergic conjunctivitis 10/04/2019  . Dysfunction of right eustachian tube 10/04/2019   . Pre-diabetes 06/02/2019  . Seasonal and perennial allergic rhinitis 12/22/2018  . Heart palpitations 12/22/2018  . History of chest pain 07/20/2018  . Brain aneurysm 07/20/2018  . Bronchitis 04/23/2018  . Tendonitis, Achilles, right 12/08/2017  . Onychomycosis 12/08/2017  . Unilateral primary osteoarthritis, left knee 10/07/2017  . Ingrown toenail 08/10/2017  . Coughing 04/24/2017  . CAD (coronary artery disease) 03/01/2015  . Aneurysm ([place]) 03/01/2015  . Dizziness 03/01/2015  . Paresthesia of arm 03/01/2015  . Allergic rhinitis 03/01/2015  . Achalasia 09/28/2012  . CN (constipation) 09/28/2012  . Cephalalgia 08/24/2012  . HTN (hypertension) 04/19/2011  . Hyperlipidemia 04/19/2011  . GERD (gastroesophageal reflux disease) 04/19/2011  . Chest pain 04/04/2011    Past Surgical History:  Procedure Laterality Date  . ABDOMINAL HYSTERECTOMY    . BACK SURGERY     x 3  . CARPAL TUNNEL RELEASE     left wrist  . CERVICAL SPINE SURGERY    . HEMORRHOID SURGERY       OB History   No obstetric history on file.     Family History  Problem Relation Age of Onset  . Allergic rhinitis Sister   . Diabetes Sister   . Hypertension Sister   . Heart attack Father 58  . Heart disease Father   . Stomach cancer Maternal Grandmother   . Heart disease Mother   . Colon cancer Neg Hx   . Angioedema Neg  Hx   . Asthma Neg Hx   . Eczema Neg Hx   . Urticaria Neg Hx   . Immunodeficiency Neg Hx   . Breast cancer Neg Hx   . Migraines Neg Hx   . Headache Neg Hx     Social History   Tobacco Use  . Smoking status: Former Smoker    Packs/day: 0.50    Years: 20.00    Pack years: 10.00    Types: Cigarettes    Quit date: 02/19/1984    Years since quitting: 36.1  . Smokeless tobacco: Never Used  Vaping Use  . Vaping Use: Never used  Substance Use Topics  . Alcohol use: No  . Drug use: No    Home Medications Prior to Admission medications   Medication Sig Start Date End Date  Taking? Authorizing Provider  albuterol (PROAIR HFA) 108 (90 Base) MCG/ACT inhaler Inhale 2 puffs into the lungs every 4 (four) hours as needed for wheezing or shortness of breath. 04/04/20   Althea Charon, FNP  apixaban (ELIQUIS) 5 MG TABS tablet Take 1 tablet (5 mg total) by mouth 2 (two) times daily. 04/06/20   Copland, Gay Filler, MD  APIXABAN Arne [place]) VTE STARTER PACK (10MG  AND 5MG ) Take as directed on package: start with two-5mg  tablets twice daily for 7 days. On day 8, switch to one-5mg  tablet twice daily. 03/21/20   Elodia Florence., MD  ascorbic acid (VITAMIN C) 500 MG tablet Take 500 mg by mouth daily.    [provider]  Calcium Carb-Cholecalciferol (CALCIUM 600 + D PO) Take 1 tablet by mouth daily.    [provider]  ELDERBERRY PO Take 50 mg by mouth daily.    [provider]  EPINEPHrine (EPIPEN 2-PAK) 0.3 mg/0.3 mL IJ SOAJ injection Inject 0.3 mLs (0.3 mg total) into the muscle as needed for anaphylaxis. Per allergen immunotherapy protocol 10/04/19   Ambs, Kathrine Cords, FNP  fluticasone (FLONASE) 50 MCG/ACT nasal spray USE 2 SPRAYS IN NOSTRIL(S) DAILY AS NEEDED.  In the right nostril point the applicator out toward your right ear.  In the left nostril point the applicator out toward her left ear Patient taking differently: Place 2 sprays into both nostrils daily as needed for allergies. 10/04/19   Ambs, Kathrine Cords, FNP  LOSARTAN POTASSIUM PO Take by mouth.    [provider]  metoprolol succinate (TOPROL-XL) 25 MG 24 hr tablet Take 0.5 tablets (12.5 mg total) by mouth daily. 09/06/19   Copland, Gay Filler, MD  Multiple Vitamins-Minerals (MULTIVITAL) tablet Take 1 tablet by mouth daily.     [provider]  pantoprazole (PROTONIX) 40 MG tablet Take 1 tablet (40 mg total) by mouth 2 (two) times daily. 11/10/19   Pyrtle, Lajuan Lines, MD  potassium chloride SA (KLOR-CON M20) 20 MEQ tablet Take 1 tablet (20 mEq total) by mouth 2 (two) times daily. Take one daily  04/20/20   Davonna Belling, MD  Probiotic Product (PROBIOTIC PO) Take 1 capsule by mouth daily.    [provider]  rizatriptan (MAXALT-MLT) 10 MG disintegrating tablet Take 10 mg by mouth as needed for migraine. May repeat in 2 hours if needed    [provider]  rosuvastatin (CRESTOR) 10 MG tablet TAKE 1 TABLET BY MOUTH EVERY DAY Patient taking differently: Take 10 mg by mouth daily. 01/11/20   Copland, Gay Filler, MD  TUBERCULIN SYR 1CC/27GX1/2" (MONOJECT SYRINGE 1ML 27GX1/2") 27G X 1/2" 1 ML MISC 10 each by Does not  apply route once as needed for up to 1 dose. 02/10/20   Dara Hoyer, FNP  zinc gluconate 50 MG tablet Take 50 mg by mouth daily.    [provider]    Allergies    Shellfish allergy, Ace inhibitors, Gabapentin, Pitavastatin, Topiramate, and Sulfa antibiotics  Review of Systems   Review of Systems  Constitutional: Negative for appetite change.  HENT: Negative for congestion.   Respiratory: Negative for shortness of breath.   Gastrointestinal: Negative for abdominal pain.  Musculoskeletal: Negative for back pain.  Skin: Negative for rash.  Neurological: Positive for dizziness. Negative for weakness.  Psychiatric/Behavioral: Negative for behavioral problems.    Physical Exam Updated Vital Signs BP (!) 150/72 (BP Location: Right Arm)   Pulse 70   Temp 98.5 F (36.9 C) (Oral)   Resp 20   Ht 5\' 5"  (1.651 m)   Wt 87.1 kg   SpO2 98%   BMI 31.95 kg/m   Physical Exam Vitals and nursing note reviewed.  HENT:     Head: Atraumatic.     Left Ear: Tympanic membrane normal.     Mouth/Throat:     Mouth: Mucous membranes are moist.  Eyes:     Extraocular Movements: Extraocular movements intact.     Pupils: Pupils are equal, round, and reactive to light.     Comments: Mild nystagmus.  Neck:     Comments: Has a pulse in the right supraclavicular area. Cardiovascular:     Rate and Rhythm: Regular rhythm.  Pulmonary:     Breath sounds: No  wheezing or rhonchi.  Abdominal:     Tenderness: There is no abdominal tenderness.  Musculoskeletal:     Cervical back: Neck supple.  Skin:    General: Skin is warm.     Capillary Refill: Capillary refill takes less than 2 seconds.  Neurological:     Mental Status: She is alert and oriented to person, place, and time.     Comments: Awake and appropriate.  Moves all extremities.  Finger-nose and heel shin intact bilaterally.  Grip strength equal bilaterally.  Mild nystagmus.     ED Results / Procedures / Treatments   Labs (all labs ordered are listed, but only abnormal results are displayed) Labs Reviewed  CBC - Abnormal; Notable for the following components:      Result Value   RBC 3.60 (*)    Hemoglobin 10.5 (*)    HCT 32.1 (*)    RDW 18.3 (*)    All other components within normal limits  BASIC METABOLIC PANEL - Abnormal; Notable for the following components:   Potassium 3.0 (*)    Glucose, Bld 108 (*)    Calcium 8.7 (*)    All other components within normal limits  TROPONIN I (HIGH SENSITIVITY)    EKG None  Radiology CT Head Wo Contrast  Result Date: 04/20/2020 CLINICAL DATA:  Dizziness EXAM: CT HEAD WITHOUT CONTRAST TECHNIQUE: Contiguous axial images were obtained from the base of the skull through the vertex without intravenous contrast. COMPARISON:  CT brain 10/31/2019, 09/11/2019 FINDINGS: Brain: No acute territorial infarction, hemorrhage, or intracranial mass. Mild atrophy. Minimal white matter hypodensity. Stable ventricle size. Vascular: No hyperdense vessels.  No unexpected calcification Skull: Normal. Negative for fracture or focal lesion. Sinuses/Orbits: No acute finding. Other: None IMPRESSION: 1. No CT evidence for acute intracranial abnormality. 2. Atrophy and minimal small vessel ischemic change of the white matter. Electronically Signed   By: Madie Reno.D.  On: 04/20/2020 21:09    Procedures Procedures   Medications Ordered in ED Medications  0.9  %  sodium chloride infusion (has no administration in time range)  potassium chloride 10 mEq in 100 mL IVPB (0 mEq Intravenous Stopped 04/20/20 2130)    ED Course  I have reviewed the triage vital signs and the nursing notes.  Pertinent labs & imaging results that were available during my care of the patient were reviewed by me and considered in my medical decision making (see chart for details).    MDM Rules/Calculators/A&P                          Patient presents few different complaints.  States feeling dizzy.  States the dizziness is on the left side of her head.  Also some numbness or tingling in her left arm.  Began after her blood pressure was elevated.  Nonfocal exam except for some mild nystagmus.  Finger-nose intact.  Able to ambulate although slightly unsteady.  Mild hypokalemia.  Has had previously.  Will increase supplementation.  Blood pressure improved.  Head CT done due to being on anticoagulation.  No bleeding.  Reviewing records it appears as if she has had similar symptoms in the past and has had somewhat extensive work-up including angiography and MRIs did not show stroke.  Do not think these need to be repeated at this time.  Patient appears stable for discharge home with outpatient follow-up. Differential diagnosis included cardiac cause of the arm pain, stroke, intracranial hemorrhage, electrolyte abnormality. Final Clinical Impression(s) / ED Diagnoses Final diagnoses:  Dizziness  Hypokalemia    Rx / DC Orders ED Discharge Orders         Ordered    potassium chloride SA (KLOR-CON M20) 20 MEQ tablet  2 times daily        04/20/20 2151           Davonna Belling, MD 04/20/20 2158

## 2020-04-22 NOTE — Progress Notes (Addendum)
[facility] at [facility] [address] [place], [place] [address] [phone]  Date:  04/24/2020   Name:  Stephanie Parks   DOB:  [birth year]   MRN:  [id number]  PCP:  [name], MD    Chief Complaint: Hospitalization Follow-up (Dizziness/) and Hypertension (Blood pressure uncontrolled, potassium follow up/)   History of Present Illness:  Stephanie Parks is a 75 y.o. very pleasant female patient who presents with the following:  Following up from recent ER visit today I also saw her in the office on February 21-at that time we were rechecking her hemoglobin, and following up on worsening of chest film seen post [id number] pneumonia, complicated by pulmonary embolism At that time she was still using oxygen, was not using her CPAP as it was not set up to include oxygen. She is still using oxygen at night but is not using it during the day, she has been able to arrange oxygen to her CPAP machine  Since her last visit, she was seen at urgent care on March 3 and then in the ER the same day-at both visit she was concerned about dizziness and elevated blood pressure She was evaluated in the ER and released to home  Of note, the patient's case manager RN had recently contacted me with concern about her anxiety. We feel that her frequent ER visits are due at least in part to anxiety regarding her blood pressure and general health.  I called patient to discuss this and encouraged her to start a medication for anxiety, she had declined at that time  Her hemoglobin was rechecked on March 3, stable at 10.5 Hypokalemia of uncertain etiology has been an issue for her for about a year Potassium was 3.0 in the ER most recently She has been taking her potassium twice a day-40 mEq total daily since her last ER visit  She is also eating a banana most days   Consider repeat chest x-ray at this time She stopped checking her BP at home as it "sends  me into a panic when I see the numbers up" She is now interested in trying to treat her anxiety   She has not used medication for anxiety in the past   She is seeing pulmonology next week- we hope she will be able to get off her nocturnal oxygen  We plan to stop her eliquis after 3 total months of therapy BP Readings from Last 3 Encounters:  04/24/20 140/70  04/20/20 (!) 150/72  04/10/20 128/60    Patient Active Problem List   Diagnosis Date Noted  . Pneumonia due to COVID-19 virus 03/12/2020  . Seasonal allergic conjunctivitis 10/04/2019  . Dysfunction of right eustachian tube 10/04/2019  . Pre-diabetes 06/02/2019  . Seasonal and perennial allergic rhinitis 12/22/2018  . Heart palpitations 12/22/2018  . History of chest pain 07/20/2018  . Brain aneurysm 07/20/2018  . Bronchitis 04/23/2018  . Tendonitis, Achilles, right 12/08/2017  . Onychomycosis 12/08/2017  . Unilateral primary osteoarthritis, left knee 10/07/2017  . Ingrown toenail 08/10/2017  . Coughing 04/24/2017  . CAD (coronary artery disease) 03/01/2015  . Aneurysm ([place]) 03/01/2015  . Dizziness 03/01/2015  . Paresthesia of arm 03/01/2015  . Allergic rhinitis 03/01/2015  . Achalasia 09/28/2012  . CN (constipation) 09/28/2012  . Cephalalgia 08/24/2012  . HTN (hypertension) 04/19/2011  . Hyperlipidemia 04/19/2011  . GERD (gastroesophageal reflux disease) 04/19/2011  . Chest pain 04/04/2011  Past Medical History:  Diagnosis Date  . 3-vessel CAD 03/01/2015  . Achalasia 09/28/2012  . Allergic rhinitis 03/01/2015  . Anemia   . Aneurysm ([place]) 03/01/2015  . BP (high blood pressure) 03/01/2015  . Bronchitis 04/23/2018  . Cephalalgia 08/24/2012   Overview:  ICD-10 cut over    . Chest pain 04/04/2011  . CN (constipation) 09/28/2012  . Coughing 04/24/2017  . Decreased potassium in the blood 03/01/2015  . Diverticulosis   . Dizziness 03/01/2015  . GERD (gastroesophageal reflux disease)   . Hiatal hernia   .  Hypercholesterolemia 03/01/2015  . Hyperlipidemia   . Hypertension   . Ingrown toenail 08/10/2017  . Inguinal hernia   . Migraine headache   . Onychomycosis 12/08/2017  . Paresthesia of arm 03/01/2015  . Schatzki's ring   . Tendonitis, Achilles, right 12/08/2017  . Unilateral primary osteoarthritis, left knee 10/07/2017    Past Surgical History:  Procedure Laterality Date  . ABDOMINAL HYSTERECTOMY    . BACK SURGERY     x 3  . CARPAL TUNNEL RELEASE     left wrist  . CERVICAL SPINE SURGERY    . HEMORRHOID SURGERY      Social History   Tobacco Use  . Smoking status: Former Smoker    Packs/day: 0.50    Years: 20.00    Pack years: 10.00    Types: Cigarettes    Quit date: 02/19/1984    Years since quitting: 36.2  . Smokeless tobacco: Never Used  Vaping Use  . Vaping Use: Never used  Substance Use Topics  . Alcohol use: No  . Drug use: No    Family History  Problem Relation Age of Onset  . Allergic rhinitis Sister   . Diabetes Sister   . Hypertension Sister   . Heart attack Father 41  . Heart disease Father   . Stomach cancer Maternal Grandmother   . Heart disease Mother   . Colon cancer Neg Hx   . Angioedema Neg Hx   . Asthma Neg Hx   . Eczema Neg Hx   . Urticaria Neg Hx   . Immunodeficiency Neg Hx   . Breast cancer Neg Hx   . Migraines Neg Hx   . Headache Neg Hx     Allergies  Allergen Reactions  . Shellfish Allergy Hives and Swelling  . Ace Inhibitors Other (See Comments)    Pt cannot recall her reaction but tolerates arb   . Gabapentin Other (See Comments)    headaches  . Pitavastatin     Unknown reaction   . Topiramate     Heart race,   . Sulfa Antibiotics Other (See Comments) and Rash    Fine bumps    Medication list has been reviewed and updated.  Current Outpatient Medications on File Prior to Visit  Medication Sig Dispense Refill  . albuterol (PROAIR HFA) 108 (90 Base) MCG/ACT inhaler Inhale 2 puffs into the lungs every 4 (four) hours as  needed for wheezing or shortness of breath. 1 each 1  . apixaban (ELIQUIS) 5 MG TABS tablet Take 1 tablet (5 mg total) by mouth 2 (two) times daily. 60 tablet 1  . ascorbic acid (VITAMIN C) 500 MG tablet Take 500 mg by mouth daily.    . Calcium Carb-Cholecalciferol (CALCIUM 600 + D PO) Take 1 tablet by mouth daily.    Marland Kitchen ELDERBERRY PO Take 50 mg by mouth daily.    Marland Kitchen EPINEPHrine (EPIPEN 2-PAK) 0.3 mg/0.3 mL IJ SOAJ  injection Inject 0.3 mLs (0.3 mg total) into the muscle as needed for anaphylaxis. Per allergen immunotherapy protocol 1 each 2  . fluticasone (FLONASE) 50 MCG/ACT nasal spray USE 2 SPRAYS IN NOSTRIL(S) DAILY AS NEEDED.  In the right nostril point the applicator out toward your right ear.  In the left nostril point the applicator out toward her left ear (Patient taking differently: Place 2 sprays into both nostrils daily as needed for allergies.) 16 g 9  . LOSARTAN POTASSIUM PO Take by mouth.    . metoprolol succinate (TOPROL-XL) 25 MG 24 hr tablet Take 0.5 tablets (12.5 mg total) by mouth daily. 90 tablet 1  . Multiple Vitamins-Minerals (MULTIVITAL) tablet Take 1 tablet by mouth daily.     . pantoprazole (PROTONIX) 40 MG tablet Take 1 tablet (40 mg total) by mouth 2 (two) times daily. 180 tablet 1  . potassium chloride SA (KLOR-CON M20) 20 MEQ tablet Take 1 tablet (20 mEq total) by mouth 2 (two) times daily. Take one daily 10 tablet 0  . Probiotic Product (PROBIOTIC PO) Take 1 capsule by mouth daily.    . rizatriptan (MAXALT-MLT) 10 MG disintegrating tablet Take 10 mg by mouth as needed for migraine. May repeat in 2 hours if needed    . rosuvastatin (CRESTOR) 10 MG tablet TAKE 1 TABLET BY MOUTH EVERY DAY (Patient taking differently: Take 10 mg by mouth daily.) 90 tablet 3  . TUBERCULIN SYR 1CC/27GX1/2" (MONOJECT SYRINGE 1ML 27GX1/2") 27G X 1/2" 1 ML MISC 10 each by Does not apply route once as needed for up to 1 dose. 10 each 0  . zinc gluconate 50 MG tablet Take 50 mg by mouth daily.      No current facility-administered medications on file prior to visit.    Review of Systems:  As per HPI- otherwise negative.   Physical Examination: Vitals:   04/24/20 0840  BP: 140/70  Pulse: 91  Resp: 17  Temp: 97.6 F (36.4 C)  SpO2: 97%   Vitals:   04/24/20 0840  Weight: 191 lb (86.6 kg)  Height: 5\' 5"  (1.651 m)   Body mass index is 31.78 kg/m. Ideal Body Weight: Weight in (lb) to have BMI = 25: 149.9  GEN: no acute distress.  Overweight, looks well HEENT: Atraumatic, Normocephalic.  Ears and Nose: No external deformity. CV: RRR, No M/G/R. No JVD. No thrill. No extra heart sounds. PULM: CTA B, no wheezes, crackles, rhonchi. No retractions. No resp. distress. No accessory muscle use. ABD: S, NT, ND, +BS. No rebound. No HSM. EXTR: No c/c/e PSYCH: Normally interactive. Conversant.    Assessment and Plan: Pneumonia due to COVID-19 virus - Plan: DG Chest 2 View  Primary hypertension  Hypokalemia - Plan: Basic metabolic panel, Ambulatory referral to Nephrology  Other acute pulmonary embolism, unspecified whether acute cor pulmonale present (HCC)  GAD (generalized anxiety disorder) - Plan: busPIRone (BUSPAR) 7.5 MG tablet  Here today for a follow-up visit.  Clarita recently had EQAST-41 pneumonia complicated by pulmonary embolism.  She is now off daytime oxygen but continues to use it at night.  She is on anticoagulation, with plan for a total of 3 months We will repeat chest film today Blood pressure under fine control Discussed her anxiety, Sophira has had many episodes of ER visit due to concern about her blood pressures.  She has recently decided to stop checking her blood pressures at home which I think is a good idea.  Explained to her blood pressure may certainly  vary, but is typically under okay control we see her in the office.  For anxiety we will have her start on BuSpar 7.5 twice a day-she will let me know how this works for her  Referral to  nephrology to discuss chronic hypokalemia, uncertain etiology We will check K today, she has been taking 40 mEq of potassium daily for the last few days  This visit occurred during the SARS-CoV-2 public health emergency.  Safety protocols were in place, including screening questions prior to the visit, additional usage of staff PPE, and extensive cleaning of exam room while observing appropriate contact time as indicated for disinfecting solutions.    Signed Lamar Blinks, MD  Received her chest film as below-message to patient DG Chest 2 View  Result Date: 04/24/2020 CLINICAL DATA:  COVID positive. EXAM: CHEST - 2 VIEW COMPARISON:  April 06, 2020 FINDINGS: Mild to moderate severity diffusely increased interstitial lung markings are seen bilaterally. This is most prominent along the periphery of both lungs and is mildly decreased in severity when compared to the prior study. There is no evidence of a pleural effusion or pneumothorax. The heart size and mediastinal contours are within normal limits. A radiopaque fusion plate and screws are seen overlying the cervical spine. The visualized skeletal structures are otherwise unremarkable. IMPRESSION: Mild to moderate severity bilateral interstitial infiltrates, mildly decreased in severity when compared to the prior study. Electronically Signed   By: Virgina Norfolk M.D.   On: 04/24/2020 09:48   Received her labs as below, message to patient  Results for orders placed or performed in visit on 20/25/42  Basic metabolic panel  Result Value Ref Range   Sodium 146 (H) 135 - 145 mEq/L   Potassium 3.6 3.5 - 5.1 mEq/L   Chloride 106 96 - 112 mEq/L   CO2 31 19 - 32 mEq/L   Glucose, Bld 114 (H) 70 - 99 mg/dL   BUN 10 6 - 23 mg/dL   Creatinine, Ser 0.78 0.40 - 1.20 mg/dL   GFR 74.76 >60.00 mL/min   Calcium 9.4 8.4 - 10.5 mg/dL

## 2020-04-24 ENCOUNTER — Encounter: Payer: Self-pay | Admitting: Family Medicine

## 2020-04-24 ENCOUNTER — Other Ambulatory Visit: Payer: Self-pay

## 2020-04-24 ENCOUNTER — Ambulatory Visit (INDEPENDENT_AMBULATORY_CARE_PROVIDER_SITE_OTHER): Payer: Medicare Other | Admitting: Family Medicine

## 2020-04-24 ENCOUNTER — Ambulatory Visit (HOSPITAL_BASED_OUTPATIENT_CLINIC_OR_DEPARTMENT_OTHER)
Admission: RE | Admit: 2020-04-24 | Discharge: 2020-04-24 | Disposition: A | Discharge status: Home / Self Care | Payer: Medicare Other | Source: Ambulatory Visit | Attending: Family Medicine | Admitting: Family Medicine

## 2020-04-24 VITALS — BP 140/70 | HR 91 | Temp 97.6°F | Resp 17 | Ht 65.0 in | Wt 191.0 lb

## 2020-04-24 DIAGNOSIS — J1282 Pneumonia due to coronavirus disease 2019: Secondary | ICD-10-CM | POA: Diagnosis not present

## 2020-04-24 DIAGNOSIS — I2699 Other pulmonary embolism without acute cor pulmonale: Secondary | ICD-10-CM

## 2020-04-24 DIAGNOSIS — I10 Essential (primary) hypertension: Secondary | ICD-10-CM

## 2020-04-24 DIAGNOSIS — U071 COVID-19: Secondary | ICD-10-CM | POA: Insufficient documentation

## 2020-04-24 DIAGNOSIS — E876 Hypokalemia: Secondary | ICD-10-CM | POA: Diagnosis not present

## 2020-04-24 DIAGNOSIS — F411 Generalized anxiety disorder: Secondary | ICD-10-CM

## 2020-04-24 LAB — BASIC METABOLIC PANEL
BUN: 10 mg/dL (ref 6–23)
CO2: 31 mEq/L (ref 19–32)
Calcium: 9.4 mg/dL (ref 8.4–10.5)
Chloride: 106 mEq/L (ref 96–112)
Creatinine, Ser: 0.78 mg/dL (ref 0.40–1.20)
GFR: 74.76 mL/min (ref >60.00)
Glucose, Bld: 114 mg/dL — ABNORMAL HIGH (ref 70–99)
Potassium: 3.6 mEq/L (ref 3.5–5.1)
Sodium: 146 mEq/L — ABNORMAL HIGH (ref 135–145)

## 2020-04-24 MED ORDER — BUSPIRONE HCL 7.5 MG PO TABS: 7.5000 mg | ORAL_TABLET | Freq: Three times a day (TID) | ORAL | 3 refills | Status: DC

## 2020-04-24 MED ORDER — POTASSIUM CHLORIDE CRYS ER 20 MEQ PO TBCR: EXTENDED_RELEASE_TABLET | ORAL | 1 refills | Status: DC

## 2020-04-24 NOTE — Addendum Note (Signed)
Addended by: Lamar Blinks C on: 04/24/2020 03:54 PM   Modules accepted: Orders

## 2020-04-24 NOTE — Patient Instructions (Signed)
It was good to see you again today- I will be in touch with your labs and xray asap Let's have you start on buspar for anxiety- take it twice a day.  Start with 7.5 mg- we can go up as needed Your BP looks fine!  Continue to NOT check you BP at home for the time being Please ask the pulmonologist about coming off oxygen at night- I am not sure if they will have a protocol for this Potassium check today- I think we will have you see nephrology to help [place] figure out why this is always low for no obvious reason

## 2020-04-25 ENCOUNTER — Telehealth: Payer: Medicare Other

## 2020-04-25 ENCOUNTER — Telehealth: Payer: Self-pay

## 2020-04-25 ENCOUNTER — Telehealth: Payer: Self-pay | Admitting: Internal Medicine

## 2020-04-25 DIAGNOSIS — Z7901 Long term (current) use of anticoagulants: Secondary | ICD-10-CM | POA: Diagnosis not present

## 2020-04-25 DIAGNOSIS — J302 Other seasonal allergic rhinitis: Secondary | ICD-10-CM | POA: Diagnosis not present

## 2020-04-25 DIAGNOSIS — M1712 Unilateral primary osteoarthritis, left knee: Secondary | ICD-10-CM | POA: Diagnosis not present

## 2020-04-25 DIAGNOSIS — I10 Essential (primary) hypertension: Secondary | ICD-10-CM | POA: Diagnosis not present

## 2020-04-25 DIAGNOSIS — K5900 Constipation, unspecified: Secondary | ICD-10-CM | POA: Diagnosis not present

## 2020-04-25 DIAGNOSIS — I2510 Atherosclerotic heart disease of native coronary artery without angina pectoris: Secondary | ICD-10-CM | POA: Diagnosis not present

## 2020-04-25 DIAGNOSIS — H43393 Other vitreous opacities, bilateral: Secondary | ICD-10-CM | POA: Diagnosis not present

## 2020-04-25 DIAGNOSIS — I2699 Other pulmonary embolism without acute cor pulmonale: Secondary | ICD-10-CM | POA: Diagnosis not present

## 2020-04-25 DIAGNOSIS — Z7951 Long term (current) use of inhaled steroids: Secondary | ICD-10-CM | POA: Diagnosis not present

## 2020-04-25 DIAGNOSIS — Z7952 Long term (current) use of systemic steroids: Secondary | ICD-10-CM | POA: Diagnosis not present

## 2020-04-25 DIAGNOSIS — K222 Esophageal obstruction: Secondary | ICD-10-CM | POA: Diagnosis not present

## 2020-04-25 DIAGNOSIS — K219 Gastro-esophageal reflux disease without esophagitis: Secondary | ICD-10-CM | POA: Diagnosis not present

## 2020-04-25 DIAGNOSIS — K5790 Diverticulosis of intestine, part unspecified, without perforation or abscess without bleeding: Secondary | ICD-10-CM | POA: Diagnosis not present

## 2020-04-25 DIAGNOSIS — D649 Anemia, unspecified: Secondary | ICD-10-CM | POA: Diagnosis not present

## 2020-04-25 DIAGNOSIS — H6991 Unspecified Eustachian tube disorder, right ear: Secondary | ICD-10-CM | POA: Diagnosis not present

## 2020-04-25 DIAGNOSIS — I671 Cerebral aneurysm, nonruptured: Secondary | ICD-10-CM | POA: Diagnosis not present

## 2020-04-25 DIAGNOSIS — J9601 Acute respiratory failure with hypoxia: Secondary | ICD-10-CM | POA: Diagnosis not present

## 2020-04-25 DIAGNOSIS — J1282 Pneumonia due to coronavirus disease 2019: Secondary | ICD-10-CM | POA: Diagnosis not present

## 2020-04-25 DIAGNOSIS — G43909 Migraine, unspecified, not intractable, without status migrainosus: Secondary | ICD-10-CM | POA: Diagnosis not present

## 2020-04-25 DIAGNOSIS — E785 Hyperlipidemia, unspecified: Secondary | ICD-10-CM | POA: Diagnosis not present

## 2020-04-25 DIAGNOSIS — U071 COVID-19: Secondary | ICD-10-CM | POA: Diagnosis not present

## 2020-04-25 DIAGNOSIS — M7661 Achilles tendinitis, right leg: Secondary | ICD-10-CM | POA: Diagnosis not present

## 2020-04-25 DIAGNOSIS — R7309 Other abnormal glucose: Secondary | ICD-10-CM | POA: Diagnosis not present

## 2020-04-25 DIAGNOSIS — G9341 Metabolic encephalopathy: Secondary | ICD-10-CM | POA: Diagnosis not present

## 2020-04-25 NOTE — Telephone Encounter (Signed)
  Chronic Care Management   Outreach Note  04/25/2020 Name: Stephanie Parks MRN: [id number] DOB: 10-13-1945  Referred by: [name], MD Reason for referral : Chronic Care Management (RNCM Initial Outreach)   An unsuccessful telephone outreach was attempted today. The patient was referred to the case management team for assistance with care management and care coordination.   Follow Up Plan: A HIPAA compliant phone message was left for the patient providing contact information and requesting a return call.  The care management team will reach out to the patient again over the next 30 days.   Thea Silversmith, RN, MSN, BSN, CCM Care Management Coordinator [facility] [phone]

## 2020-04-25 NOTE — Telephone Encounter (Signed)
Pt is requesting a call back from a nurse to discuss receiving a prescription for body odor, pt doesn't remember the name of the medicine.

## 2020-04-25 NOTE — Telephone Encounter (Signed)
Called patient to get more information. She states about a year ago she saw Dr. Raquel James and was having bad body odor and he prescribed something that helped. She doesn't remember what it was and I don't see any notes on the last few visits mentioning this problem. She states the problem is back and wanted it prescribed again. Please advise

## 2020-04-26 ENCOUNTER — Ambulatory Visit: Payer: Medicare Other | Admitting: Family Medicine

## 2020-04-26 DIAGNOSIS — R221 Localized swelling, mass and lump, neck: Secondary | ICD-10-CM | POA: Diagnosis not present

## 2020-04-26 DIAGNOSIS — R0789 Other chest pain: Secondary | ICD-10-CM | POA: Diagnosis not present

## 2020-04-26 MED ORDER — METRONIDAZOLE 250 MG PO TABS: 250.0000 mg | ORAL_TABLET | Freq: Three times a day (TID) | ORAL | 0 refills | Status: AC

## 2020-04-26 NOTE — Telephone Encounter (Signed)
Can try metronidazole 250 mg 3 times daily x7 days for SIBO type symptoms

## 2020-04-26 NOTE — Addendum Note (Signed)
Addended by: Marlon Pel on: 04/26/2020 02:54 PM   Modules accepted: Orders

## 2020-04-26 NOTE — Telephone Encounter (Signed)
Patient notified rx sent in.

## 2020-04-27 ENCOUNTER — Ambulatory Visit: Payer: Medicare Other | Admitting: Family Medicine

## 2020-04-27 ENCOUNTER — Telehealth: Payer: Self-pay

## 2020-04-27 NOTE — Telephone Encounter (Signed)
NOTES ON [facility] SENT REFERRAL TO SCHEDULING

## 2020-04-30 NOTE — Progress Notes (Signed)
Cardiology Office Note   Date:  05/01/2020   ID:  Stephanie Parks, Stephanie Parks May 11, 1945, MRN [id number]  PCP:  [name], MD    No chief complaint on file.  Chest pain  Wt Readings from Last 3 Encounters:  05/01/20 192 lb (87.1 kg)  04/24/20 191 lb (86.6 kg)  04/20/20 192 lb (87.1 kg)       History of Present Illness: Stephanie Parks is a 75 y.o. female  has had HTN and hyperlipidemia. She has a h/o brain aneurysm. She had a cath many years ago and no PCI was needed.   She has had palpitations, monitor in 2018 showed:  Normal sinus rhythm with occasional PACs and PVCs.  Occasional sinus tachycardia noted.  Patient's symptoms of fluttering did not correspond to an arrhythmia.  SHe has had atypical chest discomfort in the past. She declined stress test as symptoms were mild and quite atypical including improving with exercise and belching.  Lovastatin was switched for Crestor in 2020.   12/2018 echo essentially normal at [facility].  Cath in 2020 at [facility]: "[place] and coronary angiogram via R CFA (accessed R radial but severe  tortuosity of R SCA and unable to wire into Ao so transitioned to femoral)  for chest pain (suspect the positive troponin was lab error). LM engaged,  normal. LAD normal, LCx normal. RCA engaged, normal. Angiogram of  innominate performed, no complication from attempted wiring via radial,  severe tortuosity confirmed; " LVEDP 20 mm Hg.   11/2019 monitor showed: "Normal sinus rhythm with rare PACs and PVCs, none of which correlated to symptoms.  No pathologic arrhythmias.  No atrial fibrillation."  Main stress is working at [employer]. She is frustrated that everything has been ok on several ER trips.   She has made several trips to the urgent care with negative w/us both at [place].  She continues to have chest pain. She reports left arm pain and left leg pain.  She noted that the pain is reduced with  belching. She has an appt with pulmonary.    Admits to getting panicked with seeing high readings.    Past Medical History:  Diagnosis Date  . 3-vessel CAD 03/01/2015  . Achalasia 09/28/2012  . Allergic rhinitis 03/01/2015  . Anemia   . Aneurysm ([place]) 03/01/2015  . BP (high blood pressure) 03/01/2015  . Bronchitis 04/23/2018  . Cephalalgia 08/24/2012   Overview:  ICD-10 cut over    . Chest pain 04/04/2011  . CN (constipation) 09/28/2012  . Coughing 04/24/2017  . Decreased potassium in the blood 03/01/2015  . Diverticulosis   . Dizziness 03/01/2015  . GERD (gastroesophageal reflux disease)   . Hiatal hernia   . Hypercholesterolemia 03/01/2015  . Hyperlipidemia   . Hypertension   . Ingrown toenail 08/10/2017  . Inguinal hernia   . Migraine headache   . Onychomycosis 12/08/2017  . Paresthesia of arm 03/01/2015  . Schatzki's ring   . Tendonitis, Achilles, right 12/08/2017  . Unilateral primary osteoarthritis, left knee 10/07/2017    Past Surgical History:  Procedure Laterality Date  . ABDOMINAL HYSTERECTOMY    . BACK SURGERY     x 3  . CARPAL TUNNEL RELEASE     left wrist  . CERVICAL SPINE SURGERY    . HEMORRHOID SURGERY       Current Outpatient Medications  Medication Sig Dispense Refill  . albuterol (PROAIR HFA) 108 (90 Base) MCG/ACT inhaler Inhale 2 puffs  into the lungs every 4 (four) hours as needed for wheezing or shortness of breath. 1 each 1  . apixaban (ELIQUIS) 5 MG TABS tablet Take 1 tablet (5 mg total) by mouth 2 (two) times daily. 60 tablet 1  . ascorbic acid (VITAMIN C) 500 MG tablet Take 500 mg by mouth daily.    . Calcium Carb-Cholecalciferol (CALCIUM 600 + D PO) Take 1 tablet by mouth daily.    Marland Kitchen ELDERBERRY PO Take 50 mg by mouth daily.    Marland Kitchen EPINEPHrine (EPIPEN 2-PAK) 0.3 mg/0.3 mL IJ SOAJ injection Inject 0.3 mLs (0.3 mg total) into the muscle as needed for anaphylaxis. Per allergen immunotherapy protocol 1 each 2  . fluticasone (FLONASE) 50 MCG/ACT nasal spray USE  2 SPRAYS IN NOSTRIL(S) DAILY AS NEEDED.  In the right nostril point the applicator out toward your right ear.  In the left nostril point the applicator out toward her left ear (Patient taking differently: Place 2 sprays into both nostrils daily as needed for allergies.) 16 g 9  . LOSARTAN POTASSIUM PO Take 50 mg by mouth daily at 2 PM.    . metoprolol succinate (TOPROL-XL) 25 MG 24 hr tablet Take 0.5 tablets (12.5 mg total) by mouth daily. 90 tablet 1  . metroNIDAZOLE (FLAGYL) 250 MG tablet Take 1 tablet (250 mg total) by mouth 3 (three) times daily for 7 days. 21 tablet 0  . Multiple Vitamins-Minerals (MULTIVITAL) tablet Take 1 tablet by mouth daily.     . pantoprazole (PROTONIX) 40 MG tablet Take 1 tablet (40 mg total) by mouth 2 (two) times daily. 180 tablet 1  . potassium chloride SA (KLOR-CON M20) 20 MEQ tablet Alternate days-take 2 tablets one day and then 1 tablet the next 60 tablet 1  . rizatriptan (MAXALT-MLT) 10 MG disintegrating tablet Take 10 mg by mouth as needed for migraine. May repeat in 2 hours if needed    . rosuvastatin (CRESTOR) 10 MG tablet TAKE 1 TABLET BY MOUTH EVERY DAY (Patient taking differently: Take 10 mg by mouth daily.) 90 tablet 3  . zinc gluconate 50 MG tablet Take 50 mg by mouth daily.     No current facility-administered medications for this visit.    Allergies:   Shellfish allergy, Ace inhibitors, Gabapentin, Pitavastatin, Topiramate, and Sulfa antibiotics    Social History:  The patient  reports that she quit smoking about 36 years ago. Her smoking use included cigarettes. She has a 10.00 pack-year smoking history. She has never used smokeless tobacco. She reports that she does not drink alcohol and does not use drugs.   Family History:  The patient's family history includes Allergic rhinitis in her sister; Diabetes in her sister; Heart attack (age of onset: 30) in her father; Heart disease in her father and mother; Hypertension in her sister; Stomach cancer in  her maternal grandmother.    ROS:  Please see the history of present illness.   Otherwise, review of systems are positive for anxious.   All other systems are reviewed and negative.    PHYSICAL EXAM: VS:  BP (!) 160/78   Pulse 79   Ht 5\' 5"  (1.651 m)   Wt 192 lb (87.1 kg)   SpO2 97%   BMI 31.95 kg/m  , BMI Body mass index is 31.95 kg/m. GEN: Well nourished, well developed, in no acute distress  HEENT: normal  Neck: no JVD, carotid bruits, or masses Cardiac: RRR; no murmurs, rubs, or gallops,no edema  Respiratory:  clear to  auscultation bilaterally, normal work of breathing GI: soft, nontender, nondistended, + BS MS: no deformity or atrophy  Skin: warm and dry, no rash Neuro:  Strength and sensation are intact Psych: euthymic mood, full affect   EKG:   The ekg ordered today demonstrates NSR, PRWP, no significant change from 3/3   Recent Labs: 03/08/2020: TSH 3.80 03/17/2020: B Natriuretic Peptide 75.1 03/21/2020: ALT 24; Magnesium 2.3 04/10/2020: Pro B Natriuretic peptide (BNP) 44.0 04/20/2020: Hemoglobin 10.5; Platelets 388 04/24/2020: BUN 10; Creatinine, Ser 0.78; Potassium 3.6; Sodium 146   Lipid Panel    Component Value Date/Time   CHOL 161 06/02/2019 1439   CHOL 176 07/28/2018 0756   TRIG 98 03/12/2020 1503   HDL 56.00 06/02/2019 1439   HDL 73 07/28/2018 0756   CHOLHDL 3 06/02/2019 1439   VLDL 17.2 06/02/2019 1439   LDLCALC 87 06/02/2019 1439   LDLCALC 93 07/28/2018 0756     Other studies Reviewed: Additional studies/ records that were reviewed today with results demonstrating: [facility] clinic records reviewed.   ASSESSMENT AND PLAN:  1.  Palpitations:  Stable.  Monitor negative for arrhythmia. 2. Chest pain/arm pain- atypical.  ECG reviewed.  Prior iscuemic w/u negative. Negative troponin and BNP in 2/22. 3. HTN: Readings are high.  I stressed that she should stop using canned goods. Increase walking.  Increase losartan to 100 mg daily.  F/u in the HTN clinic. She  admits that if her BP is high at home, she "goes into a panic."  Consider amlodipine if BP stays high, or also changing metoprolol to carvedilol. 4. Unable to have cath from the right radial approach. 5. We spoke about deep breathing and relaxation techniques as well.  I think anxiety plays a large role in her symptoms.  Will defer to PCP to see if SSRI would be helpful.  6. Hyperlipidemia: LDL 87. COntinue rosuvastatin.    Current medicines are reviewed at length with the patient today.  The patient concerns regarding her medicines were addressed.  The following changes have been made:  No change  Labs/ tests ordered today include:  No orders of the defined types were placed in this encounter.   Recommend 150 minutes/week of aerobic exercise Low fat, low carb, high fiber diet recommended  Disposition:   FU with HTN clinic in 2 weeks, BMet at that time   Signed, Larae Grooms, MD  05/01/2020 9:45 AM    [facility] [address], [place], [place]  [address] Phone: [phone]; Fax: [phone]

## 2020-05-01 ENCOUNTER — Telehealth: Payer: Self-pay | Admitting: Family Medicine

## 2020-05-01 ENCOUNTER — Ambulatory Visit: Payer: Medicare Other | Admitting: Interventional Cardiology

## 2020-05-01 ENCOUNTER — Encounter: Payer: Self-pay | Admitting: Interventional Cardiology

## 2020-05-01 ENCOUNTER — Other Ambulatory Visit: Payer: Self-pay

## 2020-05-01 VITALS — BP 160/78 | HR 79 | Ht 65.0 in | Wt 192.0 lb

## 2020-05-01 DIAGNOSIS — E782 Mixed hyperlipidemia: Secondary | ICD-10-CM

## 2020-05-01 DIAGNOSIS — I10 Essential (primary) hypertension: Secondary | ICD-10-CM | POA: Diagnosis not present

## 2020-05-01 DIAGNOSIS — Z87898 Personal history of other specified conditions: Secondary | ICD-10-CM

## 2020-05-01 DIAGNOSIS — I2510 Atherosclerotic heart disease of native coronary artery without angina pectoris: Secondary | ICD-10-CM

## 2020-05-01 MED ORDER — LOSARTAN POTASSIUM 100 MG PO TABS: 100.0000 mg | ORAL_TABLET | Freq: Every day | ORAL | 5 refills | Status: DC

## 2020-05-01 NOTE — Patient Instructions (Signed)
Medication Instructions:  Your physician has recommended you make the following change in your medication: Increase Losartan to 100 mg by mouth daily  *If you need a refill on your cardiac medications before your next appointment, please call your pharmacy*   Lab Work: Lab work to be done on 3/28 at time of hypertension clinic appointment--BMP If you have labs (blood work) drawn today and your tests are completely normal, you will receive your results only by: Marland Kitchen MyChart Message (if you have MyChart) OR . A paper copy in the mail If you have any lab test that is abnormal or we need to change your treatment, we will call you to review the results.   Testing/Procedures: none   Follow-Up: At [facility], you and your health needs are our priority.  As part of our continuing mission to provide you with exceptional heart care, we have created designated Provider Care Teams.  These Care Teams include your primary Cardiologist (physician) and Advanced Practice Providers (APPs -  Physician Assistants and Nurse Practitioners) who all work together to provide you with the care you need, when you need it.  We recommend signing up for the patient portal called "MyChart".  Sign up information is provided on this After Visit Summary.  MyChart is used to connect with patients for Virtual Visits (Telemedicine).  Patients are able to view lab/test results, encounter notes, upcoming appointments, etc.  Non-urgent messages can be sent to your provider as well.   To learn more about what you can do with MyChart, go to [email or web address].    Your next appointment:   To be determined after you have seen the pharmacist  The format for your next appointment:   In Person  Provider:   You may see Larae Grooms, MD or one of the following Advanced Practice Providers on your designated Care Team:    Melina Copa, PA-C  Ermalinda Barrios, PA-C    Other Instructions You have been referred to the  hypertension clinic in our office.  Appointment is March 28,2022 at 3:30.  Bring your blood pressure cuff and home blood pressure readings to this appointment.  Your provider recommends that you maintain 150 minutes per week of moderate aerobic activity.   High-Fiber Eating Plan Fiber, also called dietary fiber, is a type of carbohydrate. It is found foods such as fruits, vegetables, whole grains, and beans. A high-fiber diet can have many health benefits. Your health care provider may recommend a high-fiber diet to help:  Prevent constipation. Fiber can make your bowel movements more regular.  Lower your cholesterol.  Relieve the following conditions: ? Inflammation of veins in the anus (hemorrhoids). ? Inflammation of specific areas of the digestive tract (uncomplicated diverticulosis). ? A problem of the large intestine, also called the colon, that sometimes causes pain and diarrhea (irritable bowel syndrome, or IBS).  Prevent overeating as part of a weight-loss plan.  Prevent heart disease, type 2 diabetes, and certain cancers. What are tips for following this plan? Reading food labels  Check the nutrition facts label on food products for the amount of dietary fiber. Choose foods that have 5 grams of fiber or more per serving.  The goals for recommended daily fiber intake include: ? Men (age 19 or younger): 34-38 g. ? Men (over age 15): 28-34 g. ? Women (age 39 or younger): 25-28 g. ? Women (over age 57): 22-25 g. Your daily fiber goal is _____________ g.   Shopping  Choose whole fruits and vegetables instead  of processed forms, such as apple juice or applesauce.  Choose a wide variety of high-fiber foods such as avocados, lentils, oats, and kidney beans.  Read the nutrition facts label of the foods you choose. Be aware of foods with added fiber. These foods often have high sugar and sodium amounts per serving. Cooking  Use whole-grain flour for baking and cooking.  Cook  with brown rice instead of white rice. Meal planning  Start the day with a breakfast that is high in fiber, such as a cereal that contains 5 g of fiber or more per serving.  Eat breads and cereals that are made with whole-grain flour instead of refined flour or white flour.  Eat brown rice, bulgur wheat, or millet instead of white rice.  Use beans in place of meat in soups, salads, and pasta dishes.  Be sure that half of the grains you eat each day are whole grains. General information  You can get the recommended daily intake of dietary fiber by: ? Eating a variety of fruits, vegetables, grains, nuts, and beans. ? Taking a fiber supplement if you are not able to take in enough fiber in your diet. It is better to get fiber through food than from a supplement.  Gradually increase how much fiber you consume. If you increase your intake of dietary fiber too quickly, you may have bloating, cramping, or gas.  Drink plenty of water to help you digest fiber.  Choose high-fiber snacks, such as berries, raw vegetables, nuts, and popcorn. What foods should I eat? Fruits Berries. Pears. Apples. Oranges. Avocado. Prunes and raisins. Dried figs. Vegetables Sweet potatoes. Spinach. Kale. Artichokes. Cabbage. Broccoli. Cauliflower. Green peas. Carrots. Squash. Grains Whole-grain breads. Multigrain cereal. Oats and oatmeal. Brown rice. Barley. Bulgur wheat. [place]. Quinoa. Bran muffins. Popcorn. Rye wafer crackers. Meats and other proteins Navy beans, kidney beans, and pinto beans. Soybeans. Split peas. Lentils. Nuts and seeds. Dairy Fiber-fortified yogurt. Beverages Fiber-fortified soy milk. Fiber-fortified orange juice. Other foods Fiber bars. The items listed above may not be a complete list of recommended foods and beverages. Contact a dietitian for more information. What foods should I avoid? Fruits Fruit juice. Cooked, strained fruit. Vegetables Fried potatoes. Canned vegetables.  Well-cooked vegetables. Grains White bread. Pasta made with refined flour. White rice. Meats and other proteins Fatty cuts of meat. Fried chicken or fried fish. Dairy Milk. Yogurt. Cream cheese. Sour cream. Fats and oils Butters. Beverages Soft drinks. Other foods Cakes and pastries. The items listed above may not be a complete list of foods and beverages to avoid. Talk with your dietitian about what choices are best for you. Summary  Fiber is a type of carbohydrate. It is found in foods such as fruits, vegetables, whole grains, and beans.  A high-fiber diet has many benefits. It can help to prevent constipation, lower blood cholesterol, aid weight loss, and reduce your risk of heart disease, diabetes, and certain cancers.  Increase your intake of fiber gradually. Increasing fiber too quickly may cause cramping, bloating, and gas. Drink plenty of water while you increase the amount of fiber you consume.  The best sources of fiber include whole fruits and vegetables, whole grains, nuts, seeds, and beans. This information is not intended to replace advice given to you by your health care provider. Make sure you discuss any questions you have with your health care provider. Document Revised: 06/10/2019 Document Reviewed: 06/10/2019 Elsevier Patient Education  2021 [employer].

## 2020-05-01 NOTE — Telephone Encounter (Signed)
Patient states the medical form that she fax over to her job they did not received it, can Dr,Copland please resend it before April the 1st the fax # is [phone]

## 2020-05-02 ENCOUNTER — Telehealth: Payer: Self-pay | Admitting: Interventional Cardiology

## 2020-05-02 DIAGNOSIS — U099 Post covid-19 condition, unspecified: Secondary | ICD-10-CM | POA: Insufficient documentation

## 2020-05-02 DIAGNOSIS — R0609 Other forms of dyspnea: Secondary | ICD-10-CM | POA: Insufficient documentation

## 2020-05-02 DIAGNOSIS — J9601 Acute respiratory failure with hypoxia: Secondary | ICD-10-CM | POA: Diagnosis not present

## 2020-05-02 DIAGNOSIS — I2609 Other pulmonary embolism with acute cor pulmonale: Secondary | ICD-10-CM | POA: Diagnosis not present

## 2020-05-02 NOTE — Telephone Encounter (Signed)
Pt c/o medication issue:  1. Name of Medication:  losartan (COZAAR) 100 MG tablet  2. How are you currently taking this medication (dosage and times per day)? 1 tablet  3. Are you having a reaction (difficulty breathing--STAT)? Heart racing   4. What is your medication issue? Patient stats that she takes one pill in the morning and the other pill in the afternoon. Patient states when she take the 2 bill her heart racing real fast. Blood pressure 147/68 HR 85 please advise

## 2020-05-02 NOTE — Telephone Encounter (Signed)
Refaxed forms to patient's employer.

## 2020-05-02 NOTE — Telephone Encounter (Signed)
I spoke with patient.  Losartan was increased from 50 mg daily to 100 mg daily at yesterday's office visit. Pharmacy did not have 100 mg dose yet so so patient took 50 mg twice today.  I told her she could take 2 of the 50 mg tablets once daily.  She had brief palpitations today and became very stressed regarding BP reading.  Reading noted below.  I told her this reading was OK.  She will follow up with hypertension clinic as planned.

## 2020-05-03 ENCOUNTER — Other Ambulatory Visit: Payer: Self-pay | Admitting: Interventional Cardiology

## 2020-05-03 DIAGNOSIS — K219 Gastro-esophageal reflux disease without esophagitis: Secondary | ICD-10-CM | POA: Diagnosis not present

## 2020-05-03 DIAGNOSIS — G43909 Migraine, unspecified, not intractable, without status migrainosus: Secondary | ICD-10-CM | POA: Diagnosis not present

## 2020-05-03 DIAGNOSIS — E785 Hyperlipidemia, unspecified: Secondary | ICD-10-CM | POA: Diagnosis not present

## 2020-05-03 DIAGNOSIS — I10 Essential (primary) hypertension: Secondary | ICD-10-CM | POA: Diagnosis not present

## 2020-05-03 DIAGNOSIS — I2510 Atherosclerotic heart disease of native coronary artery without angina pectoris: Secondary | ICD-10-CM | POA: Diagnosis not present

## 2020-05-03 DIAGNOSIS — H6991 Unspecified Eustachian tube disorder, right ear: Secondary | ICD-10-CM | POA: Diagnosis not present

## 2020-05-03 DIAGNOSIS — J9601 Acute respiratory failure with hypoxia: Secondary | ICD-10-CM | POA: Diagnosis not present

## 2020-05-03 DIAGNOSIS — G9341 Metabolic encephalopathy: Secondary | ICD-10-CM | POA: Diagnosis not present

## 2020-05-03 DIAGNOSIS — J1282 Pneumonia due to coronavirus disease 2019: Secondary | ICD-10-CM | POA: Diagnosis not present

## 2020-05-03 DIAGNOSIS — U071 COVID-19: Secondary | ICD-10-CM | POA: Diagnosis not present

## 2020-05-03 DIAGNOSIS — I2699 Other pulmonary embolism without acute cor pulmonale: Secondary | ICD-10-CM | POA: Diagnosis not present

## 2020-05-03 DIAGNOSIS — D649 Anemia, unspecified: Secondary | ICD-10-CM | POA: Diagnosis not present

## 2020-05-03 DIAGNOSIS — M1712 Unilateral primary osteoarthritis, left knee: Secondary | ICD-10-CM | POA: Diagnosis not present

## 2020-05-03 DIAGNOSIS — Z7951 Long term (current) use of inhaled steroids: Secondary | ICD-10-CM | POA: Diagnosis not present

## 2020-05-03 DIAGNOSIS — K5790 Diverticulosis of intestine, part unspecified, without perforation or abscess without bleeding: Secondary | ICD-10-CM | POA: Diagnosis not present

## 2020-05-03 DIAGNOSIS — K5900 Constipation, unspecified: Secondary | ICD-10-CM | POA: Diagnosis not present

## 2020-05-03 DIAGNOSIS — R7309 Other abnormal glucose: Secondary | ICD-10-CM | POA: Diagnosis not present

## 2020-05-03 DIAGNOSIS — M7661 Achilles tendinitis, right leg: Secondary | ICD-10-CM | POA: Diagnosis not present

## 2020-05-03 DIAGNOSIS — H43393 Other vitreous opacities, bilateral: Secondary | ICD-10-CM | POA: Diagnosis not present

## 2020-05-03 DIAGNOSIS — Z7952 Long term (current) use of systemic steroids: Secondary | ICD-10-CM | POA: Diagnosis not present

## 2020-05-03 DIAGNOSIS — J302 Other seasonal allergic rhinitis: Secondary | ICD-10-CM | POA: Diagnosis not present

## 2020-05-03 DIAGNOSIS — Z7901 Long term (current) use of anticoagulants: Secondary | ICD-10-CM | POA: Diagnosis not present

## 2020-05-03 DIAGNOSIS — I671 Cerebral aneurysm, nonruptured: Secondary | ICD-10-CM | POA: Diagnosis not present

## 2020-05-03 DIAGNOSIS — K222 Esophageal obstruction: Secondary | ICD-10-CM | POA: Diagnosis not present

## 2020-05-03 NOTE — Telephone Encounter (Signed)
Pt's pharmacy is stating that pt's medication losartan is on backorder, without a release date and the pharmacy would like to know if Dr Irish Lack would like to prescribe an alternative? Please address

## 2020-05-04 NOTE — Telephone Encounter (Signed)
I spoke with patient and gave her information regarding medication change.  Patient reports she has enough of the 50 mg tablets to take 2 daily and does not want to make any changes prior to being seen in hypertension clinic on 3/28.  I left message on pharmacy voicemail with this information

## 2020-05-04 NOTE — Telephone Encounter (Signed)
Recommend changing to equivalent dose of irbesartan 300mg  once daily. She should keep follow up with PharmD for HTN that's scheduled on 3/28.

## 2020-05-05 DIAGNOSIS — R221 Localized swelling, mass and lump, neck: Secondary | ICD-10-CM | POA: Diagnosis not present

## 2020-05-05 DIAGNOSIS — Z981 Arthrodesis status: Secondary | ICD-10-CM | POA: Diagnosis not present

## 2020-05-05 DIAGNOSIS — M50323 Other cervical disc degeneration at C6-C7 level: Secondary | ICD-10-CM | POA: Diagnosis not present

## 2020-05-08 ENCOUNTER — Other Ambulatory Visit: Payer: Self-pay

## 2020-05-08 ENCOUNTER — Ambulatory Visit (INDEPENDENT_AMBULATORY_CARE_PROVIDER_SITE_OTHER): Payer: Medicare Other

## 2020-05-08 DIAGNOSIS — Z7952 Long term (current) use of systemic steroids: Secondary | ICD-10-CM | POA: Diagnosis not present

## 2020-05-08 DIAGNOSIS — U071 COVID-19: Secondary | ICD-10-CM | POA: Diagnosis not present

## 2020-05-08 DIAGNOSIS — K5900 Constipation, unspecified: Secondary | ICD-10-CM | POA: Diagnosis not present

## 2020-05-08 DIAGNOSIS — I2699 Other pulmonary embolism without acute cor pulmonale: Secondary | ICD-10-CM | POA: Diagnosis not present

## 2020-05-08 DIAGNOSIS — K219 Gastro-esophageal reflux disease without esophagitis: Secondary | ICD-10-CM | POA: Diagnosis not present

## 2020-05-08 DIAGNOSIS — G43909 Migraine, unspecified, not intractable, without status migrainosus: Secondary | ICD-10-CM | POA: Diagnosis not present

## 2020-05-08 DIAGNOSIS — J9601 Acute respiratory failure with hypoxia: Secondary | ICD-10-CM | POA: Diagnosis not present

## 2020-05-08 DIAGNOSIS — D649 Anemia, unspecified: Secondary | ICD-10-CM | POA: Diagnosis not present

## 2020-05-08 DIAGNOSIS — M1712 Unilateral primary osteoarthritis, left knee: Secondary | ICD-10-CM | POA: Diagnosis not present

## 2020-05-08 DIAGNOSIS — I10 Essential (primary) hypertension: Secondary | ICD-10-CM

## 2020-05-08 DIAGNOSIS — H43393 Other vitreous opacities, bilateral: Secondary | ICD-10-CM | POA: Diagnosis not present

## 2020-05-08 DIAGNOSIS — J302 Other seasonal allergic rhinitis: Secondary | ICD-10-CM | POA: Diagnosis not present

## 2020-05-08 DIAGNOSIS — J1282 Pneumonia due to coronavirus disease 2019: Secondary | ICD-10-CM | POA: Diagnosis not present

## 2020-05-08 DIAGNOSIS — I671 Cerebral aneurysm, nonruptured: Secondary | ICD-10-CM | POA: Diagnosis not present

## 2020-05-08 DIAGNOSIS — G9341 Metabolic encephalopathy: Secondary | ICD-10-CM | POA: Diagnosis not present

## 2020-05-08 DIAGNOSIS — G4733 Obstructive sleep apnea (adult) (pediatric): Secondary | ICD-10-CM

## 2020-05-08 DIAGNOSIS — M7661 Achilles tendinitis, right leg: Secondary | ICD-10-CM | POA: Diagnosis not present

## 2020-05-08 DIAGNOSIS — I2510 Atherosclerotic heart disease of native coronary artery without angina pectoris: Secondary | ICD-10-CM | POA: Diagnosis not present

## 2020-05-08 DIAGNOSIS — Z7951 Long term (current) use of inhaled steroids: Secondary | ICD-10-CM | POA: Diagnosis not present

## 2020-05-08 DIAGNOSIS — R7309 Other abnormal glucose: Secondary | ICD-10-CM | POA: Diagnosis not present

## 2020-05-08 DIAGNOSIS — E785 Hyperlipidemia, unspecified: Secondary | ICD-10-CM | POA: Diagnosis not present

## 2020-05-08 DIAGNOSIS — K222 Esophageal obstruction: Secondary | ICD-10-CM | POA: Diagnosis not present

## 2020-05-08 DIAGNOSIS — H6991 Unspecified Eustachian tube disorder, right ear: Secondary | ICD-10-CM | POA: Diagnosis not present

## 2020-05-08 DIAGNOSIS — K5790 Diverticulosis of intestine, part unspecified, without perforation or abscess without bleeding: Secondary | ICD-10-CM | POA: Diagnosis not present

## 2020-05-08 DIAGNOSIS — Z7901 Long term (current) use of anticoagulants: Secondary | ICD-10-CM | POA: Diagnosis not present

## 2020-05-08 NOTE — Patient Instructions (Addendum)
Visit Information: Thank you for taking the time to speak with me today.  PATIENT GOALS:  Goals Addressed            This Visit's Progress   . Increase activity and improve endurance   On track    Timeframe:  Long-Range Goal Priority:  Medium Start Date:  05/08/20                           Expected End Date:  08/07/20                     Follow up date 06/06/20  Participate with physical therapy as recommended. Perform Activities of daily living independently. Take your blood oxygen readings try to keep your oxygen saturation mid-high 90's  Call your provider if you have any questions or concerns. Wear oxygen per pulmonary recommendation with exertional activity and with sleep/CPAP. Patient will self administer medications as prescribed. Patient will attend all scheduled provider appointments. Eat Healthy meal plan: foods low in salt and heart healthy meals full of fruits, vegetables, whole grains, lean protein and limit fat, and sugars. It may also help to eat smaller portions 5-6 time a day if possible.  Discuss appetite with your provider at the follow up appointment 05/29/20, if appetite has not improved. Patient will call provider office for new concerns or questions.    . Track and Manage My Blood Pressure-Hypertension   On track    Timeframe:  Long-Range Goal Priority:  Medium Start Date:  05/08/20                           Expected End Date:  08/07/20                     Follow Up Date 06/06/20    - check blood pressure as recommended by provider - write blood pressure results in a log or diary and take to provider appointments. -take your blood pressure cuff to provider office to check accuracy of your blood pressure monitor. -attend provider visits as scheduled -plan to eat low salt and heart healthy meals with fruits, vegetables, whole grains, lean protein and limit fats and sugars. -increase activity as tolerated and recommended by your providers. Continue to work with  your home health physical therapist as scheduled. -Review educational material on DASH diet. -ask provider "what is my Target blood pressure goal?" and "what is my Target blood pressure range?" at next visit.  Why is this important?    You won't feel high blood pressure, but it can still hurt your blood vessels.   High blood pressure can cause heart or kidney problems. It can also cause a stroke.   Making lifestyle changes like losing a little weight or eating less salt will help.   Checking your blood pressure at home and at different times of the day can help to control blood pressure.   If the doctor prescribes medicine remember to take it the way the doctor ordered.   Call the office if you cannot afford the medicine or if there are questions about it.        Healthy Eating Following a healthy eating pattern may help you to achieve and maintain a healthy body weight, reduce the risk of chronic disease, and live a long and productive life. It is important to follow a healthy eating pattern at an appropriate  calorie level for your body. Your nutritional needs should be met primarily through food by choosing a variety of nutrient-rich foods. What are tips for following this plan? Reading food labels  Read labels and choose the following: ? Reduced or low sodium. ? Juices with 100% fruit juice. ? Foods with low saturated fats and high polyunsaturated and monounsaturated fats. ? Foods with whole grains, such as whole wheat, cracked wheat, brown rice, and wild rice. ? Whole grains that are fortified with folic acid. This is recommended for women who are pregnant or who want to become pregnant.  Read labels and avoid the following: ? Foods with a lot of added sugars. These include foods that contain brown sugar, corn sweetener, corn syrup, dextrose, fructose, glucose, high-fructose corn syrup, honey, invert sugar, lactose, malt syrup, maltose, molasses, raw sugar, sucrose, trehalose, or  t[place]inado sugar.  Do not eat more than the following amounts of added sugar per day:  6 teaspoons (25 g) for women.  9 teaspoons (38 g) for men. ? Foods that contain processed or refined starches and grains. ? Refined grain products, such as white flour, degermed cornmeal, white bread, and white rice. Shopping  Choose nutrient-rich snacks, such as vegetables, whole fruits, and nuts. Avoid high-calorie and high-sugar snacks, such as potato chips, fruit snacks, and candy.  Use oil-based dressings and spreads on foods instead of solid fats such as butter, stick margarine, or cream cheese.  Limit pre-made sauces, mixes, and "instant" products such as flavored rice, instant noodles, and ready-made pasta.  Try more plant-protein sources, such as tofu, tempeh, black beans, edamame, lentils, nuts, and seeds.  Explore eating plans such as the Mediterranean diet or vegetarian diet. Cooking  Use oil to saut or stir-fry foods instead of solid fats such as butter, stick margarine, or lard.  Try baking, boiling, grilling, or broiling instead of frying.  Remove the fatty part of meats before cooking.  Steam vegetables in water or broth. Meal planning  At meals, imagine dividing your plate into fourths: ? One-half of your plate is fruits and vegetables. ? One-fourth of your plate is whole grains. ? One-fourth of your plate is protein, especially lean meats, poultry, eggs, tofu, beans, or nuts.  Include low-fat dairy as part of your daily diet.   Lifestyle  Choose healthy options in all settings, including home, work, school, restaurants, or stores.  Prepare your food safely: ? Wash your hands after handling raw meats. ? Keep food preparation surfaces clean by regularly washing with hot, soapy water. ? Keep raw meats separate from ready-to-eat foods, such as fruits and vegetables. ? Cook seafood, meat, poultry, and eggs to the recommended internal temperature. ? Store foods at safe  temperatures. In general:  Keep cold foods at 51F (4.4C) or below.  Keep hot foods at 151F (60C) or above.  Keep your freezer at [facility] (-17.8C) or below.  Foods are no longer safe to eat when they have been between the temperatures of 40-151F (4.4-60C) for more than 2 hours. What foods should I eat? Fruits Aim to eat 2 cup-equivalents of fresh, canned (in natural juice), or frozen fruits each day. Examples of 1 cup-equivalent of fruit include 1 small apple, 8 large strawberries, 1 cup canned fruit,  cup dried fruit, or 1 cup 100% juice. Vegetables Aim to eat 2-3 cup-equivalents of fresh and frozen vegetables each day, including different varieties and colors. Examples of 1 cup-equivalent of vegetables include 2 medium carrots, 2 cups raw, leafy greens, 1  cup chopped vegetable (raw or cooked), or 1 medium baked potato. Grains Aim to eat 6 ounce-equivalents of whole grains each day. Examples of 1 ounce-equivalent of grains include 1 slice of bread, 1 cup ready-to-eat cereal, 3 cups popcorn, or  cup cooked rice, pasta, or cereal. Meats and other proteins Aim to eat 5-6 ounce-equivalents of protein each day. Examples of 1 ounce-equivalent of protein include 1 egg, 1/2 cup nuts or seeds, or 1 tablespoon (16 g) peanut butter. A cut of meat or fish that is the size of a deck of cards is about 3-4 ounce-equivalents.  Of the protein you eat each week, try to have at least 8 ounces come from seafood. This includes salmon, trout, herring, and anchovies. Dairy Aim to eat 3 cup-equivalents of fat-free or low-fat dairy each day. Examples of 1 cup-equivalent of dairy include 1 cup (240 mL) milk, 8 ounces (250 g) yogurt, 1 ounces (44 g) natural cheese, or 1 cup (240 mL) fortified soy milk. Fats and oils  Aim for about 5 teaspoons (21 g) per day. Choose monounsaturated fats, such as canola and olive oils, avocados, peanut butter, and most nuts, or polyunsaturated fats, such as sunflower, corn, and  soybean oils, walnuts, pine nuts, sesame seeds, sunflower seeds, and flaxseed. Beverages  Aim for six 8-oz glasses of water per day. Limit coffee to three to five 8-oz cups per day.  Limit caffeinated beverages that have added calories, such as soda and energy drinks.  Limit alcohol intake to no more than 1 drink a day for nonpregnant women and 2 drinks a day for men. One drink equals 12 oz of beer (355 mL), 5 oz of wine (148 mL), or 1 oz of hard liquor (44 mL). Seasoning and other foods  Avoid adding excess amounts of salt to your foods. Try flavoring foods with herbs and spices instead of salt.  Avoid adding sugar to foods.  Try using oil-based dressings, sauces, and spreads instead of solid fats. This information is based on general [place] nutrition guidelines. For more information, visit BuildDNA.es. Exact amounts may vary based on your nutrition needs. Summary  A healthy eating plan may help you to maintain a healthy weight, reduce the risk of chronic diseases, and stay active throughout your life.  Plan your meals. Make sure you eat the right portions of a variety of nutrient-rich foods.  Try baking, boiling, grilling, or broiling instead of frying.  Choose healthy options in all settings, including home, work, school, restaurants, or stores. This information is not intended to replace advice given to you by your health care provider. Make sure you discuss any questions you have with your health care provider. Document Revised: 05/19/2017 Document Reviewed: 05/19/2017 Elsevier Patient Education  2021 [place] Eating Plan DASH stands for Dietary Approaches to Stop Hypertension. The DASH eating plan is a healthy eating plan that has been shown to:  Reduce high blood pressure (hypertension).  Reduce your risk for type 2 diabetes, heart disease, and stroke.  Help with weight loss. What are tips for following this plan? Reading food labels  Check food  labels for the amount of salt (sodium) per serving. Choose foods with less than 5 percent of the Daily Value of sodium. Generally, foods with less than 300 milligrams (mg) of sodium per serving fit into this eating plan.  To find whole grains, look for the word "whole" as the first word in the ingredient list. Shopping  Buy products labeled as "  low-sodium" or "no salt added."  Buy fresh foods. Avoid canned foods and pre-made or frozen meals. Cooking  Avoid adding salt when cooking. Use salt-free seasonings or herbs instead of table salt or sea salt. Check with your health care provider or pharmacist before using salt substitutes.  Do not fry foods. Cook foods using healthy methods such as baking, boiling, grilling, roasting, and broiling instead.  Cook with heart-healthy oils, such as olive, canola, avocado, soybean, or sunflower oil. Meal planning  Eat a balanced diet that includes: ? 4 or more servings of fruits and 4 or more servings of vegetables each day. Try to fill one-half of your plate with fruits and vegetables. ? 6-8 servings of whole grains each day. ? Less than 6 oz (170 g) of lean meat, poultry, or fish each day. A 3-oz (85-g) serving of meat is about the same size as a deck of cards. One egg equals 1 oz (28 g). ? 2-3 servings of low-fat dairy each day. One serving is 1 cup (237 mL). ? 1 serving of nuts, seeds, or beans 5 times each week. ? 2-3 servings of heart-healthy fats. Healthy fats called omega-3 fatty acids are found in foods such as walnuts, flaxseeds, fortified milks, and eggs. These fats are also found in cold-water fish, such as sardines, salmon, and mackerel.  Limit how much you eat of: ? Canned or prepackaged foods. ? Food that is high in trans fat, such as some fried foods. ? Food that is high in saturated fat, such as fatty meat. ? Desserts and other sweets, sugary drinks, and other foods with added sugar. ? Full-fat dairy products.  Do not salt foods  before eating.  Do not eat more than 4 egg yolks a week.  Try to eat at least 2 vegetarian meals a week.  Eat more home-cooked food and less restaurant, buffet, and fast food.   Lifestyle  When eating at a restaurant, ask that your food be prepared with less salt or no salt, if possible.  If you drink alcohol: ? Limit how much you use to:  0-1 drink a day for women who are not pregnant.  0-2 drinks a day for men. ? Be aware of how much alcohol is in your drink. In the [place] one drink equals one 12 oz bottle of beer (355 mL), one 5 oz glass of wine (148 mL), or one 1 oz glass of hard liquor (44 mL). General information  Avoid eating more than 2,300 mg of salt a day. If you have hypertension, you may need to reduce your sodium intake to 1,500 mg a day.  Work with your health care provider to maintain a healthy body weight or to lose weight. Ask what an ideal weight is for you.  Get at least 30 minutes of exercise that causes your heart to beat faster (aerobic exercise) most days of the week. Activities may include walking, swimming, or biking.  Work with your health care provider or dietitian to adjust your eating plan to your individual calorie needs. What foods should I eat? Fruits All fresh, dried, or frozen fruit. Canned fruit in natural juice (without added sugar). Vegetables Fresh or frozen vegetables (raw, steamed, roasted, or grilled). Low-sodium or reduced-sodium tomato and vegetable juice. Low-sodium or reduced-sodium tomato sauce and tomato paste. Low-sodium or reduced-sodium canned vegetables. Grains Whole-grain or whole-wheat bread. Whole-grain or whole-wheat pasta. Brown rice. [place]. Bulgur. Whole-grain and low-sodium cereals. Pita bread. Low-fat, low-sodium crackers. Whole-wheat flour tortillas. Meats  and other proteins Skinless chicken or [place]. Ground chicken or [place]. Pork with fat trimmed off. Fish and seafood. Egg whites. Dried beans, peas, or  lentils. Unsalted nuts, nut butters, and seeds. Unsalted canned beans. Lean cuts of beef with fat trimmed off. Low-sodium, lean precooked or cured meat, such as sausages or meat loaves. Dairy Low-fat (1%) or fat-free (skim) milk. Reduced-fat, low-fat, or fat-free cheeses. Nonfat, low-sodium ricotta or cottage cheese. Low-fat or nonfat yogurt. Low-fat, low-sodium cheese. Fats and oils Soft margarine without trans fats. Vegetable oil. Reduced-fat, low-fat, or light mayonnaise and salad dressings (reduced-sodium). Canola, safflower, olive, avocado, soybean, and sunflower oils. Avocado. Seasonings and condiments Herbs. Spices. Seasoning mixes without salt. Other foods Unsalted popcorn and pretzels. Fat-free sweets. The items listed above may not be a complete list of foods and beverages you can eat. Contact a dietitian for more information. What foods should I avoid? Fruits Canned fruit in a light or heavy syrup. Fried fruit. Fruit in cream or butter sauce. Vegetables Creamed or fried vegetables. Vegetables in a cheese sauce. Regular canned vegetables (not low-sodium or reduced-sodium). Regular canned tomato sauce and paste (not low-sodium or reduced-sodium). Regular tomato and vegetable juice (not low-sodium or reduced-sodium). Angie Fava. Olives. Grains Baked goods made with fat, such as croissants, muffins, or some breads. Dry pasta or rice meal packs. Meats and other proteins Fatty cuts of meat. Ribs. Fried meat. Berniece Salines. Bologna, salami, and other precooked or cured meats, such as sausages or meat loaves. Fat from the back of a pig (fatback). Bratwurst. Salted nuts and seeds. Canned beans with added salt. Canned or smoked fish. Whole eggs or egg yolks. Chicken or [place] with skin. Dairy Whole or 2% milk, cream, and half-and-half. Whole or full-fat cream cheese. Whole-fat or sweetened yogurt. Full-fat cheese. Nondairy creamers. Whipped toppings. Processed cheese and cheese spreads. Fats and  oils Butter. Stick margarine. Lard. Shortening. Ghee. Bacon fat. Tropical oils, such as coconut, palm kernel, or palm oil. Seasonings and condiments Onion salt, garlic salt, seasoned salt, table salt, and sea salt. Worcestershire sauce. Tartar sauce. Barbecue sauce. Teriyaki sauce. Soy sauce, including reduced-sodium. Steak sauce. Canned and packaged gravies. Fish sauce. Oyster sauce. Cocktail sauce. Store-bought horseradish. Ketchup. Mustard. Meat flavorings and tenderizers. Bouillon cubes. Hot sauces. Pre-made or packaged marinades. Pre-made or packaged taco seasonings. Relishes. Regular salad dressings. Other foods Salted popcorn and pretzels. The items listed above may not be a complete list of foods and beverages you should avoid. Contact a dietitian for more information. Where to find more information  National Heart, Lung, and Blood Institute: https://wilson-eaton.com/  American Heart Association: www.heart.org  Academy of Nutrition and Dietetics: www.eatright.[place]: www.kidney.org Summary  The DASH eating plan is a healthy eating plan that has been shown to reduce high blood pressure (hypertension). It may also reduce your risk for type 2 diabetes, heart disease, and stroke.  When on the DASH eating plan, aim to eat more fresh fruits and vegetables, whole grains, lean proteins, low-fat dairy, and heart-healthy fats.  With the DASH eating plan, you should limit salt (sodium) intake to 2,300 mg a day. If you have hypertension, you may need to reduce your sodium intake to 1,500 mg a day.  Work with your health care provider or dietitian to adjust your eating plan to your individual calorie needs. This information is not intended to replace advice given to you by your health care provider. Make sure you discuss any questions you have with your health care provider. Document  Revised: 01/08/2019 Document Reviewed: 01/08/2019 Elsevier Patient Education  2021 [place].  Consent to CCM Services: Ms. Rotenberg was given information about Chronic Care Management services today including:  1. CCM service includes personalized support from designated clinical staff supervised by her physician, including individualized plan of care and coordination with other care providers 2. 24/7 contact phone numbers for assistance for urgent and routine care needs. 3. Service will only be billed when office clinical staff spend 20 minutes or more in a month to coordinate care. 4. Only one practitioner may furnish and bill the service in a calendar month. 5. The patient may stop CCM services at any time (effective at the end of the month) by phone call to the office staff. 6. The patient will be responsible for cost sharing (co-pay) of up to 20% of the service fee (after annual deductible is met).  Patient agreed to services and verbal consent obtained.   Patient verbalizes understanding of instructions provided today and agrees to view in [place].   The patient has been provided with contact information for the care management team and has been advised to call with any health related questions or concerns.  The care management team will reach out to the patient again over the next 30 days.   Thea Silversmith, RN, MSN, BSN, CCM Care Management Coordinator [facility] [phone]   CLINICAL CARE PLAN:   Patient Care Plan: Post Covid Pneumonia    Problem Identified: Decrease in Activity and Endurance 2 months Covid   Priority: Medium  Long-Range Goal: Development of long term plan for managment of ongoing symptoms post covid pneumonia in a patient with OSA, chronic seasonal allergies, deviated nasal septum   Start Date: 05/08/2020  Expected End Date: 08/06/2020  This Visit's Progress: On track  Priority: Medium  Note:   Current Barriers:  Marland Kitchen Knowledge Deficits related to long term plan for management for ongoing signs/symptoms post Covid pneumonia .  Patient hospitalized 03/12/20-03/21/20 with Covid pneumonia. She reports that she is much better since discharge from hospitalization. But states her activity level is not back to before hospitalization. She reports office visit with pulmonologist on 05/02/20. And she is participating with home health physical therapist for increase activity/stamina. She uses oxygen, but does not need it during the day, only at night. She reports a decreased appetite since Covid 19 infection and does not eat a lot. RNCM discussed the importance of good nutrition and encouraged client to work on eating nutritious foods.  Nurse Case Manager Clinical Goal(s):  . patient will verbalize understanding of plan for improvement of over time, improve ability to use energy conservation and management techniques. . patient will work with physical therapist to address needs related to increase mobility and endurance . patient will attend all scheduled medical appointments: next visit with primary care provider scheduled for 05/29/20. . the patient will demonstrate ongoing self health care management ability as evidenced by continuing to attend provider visits, taking medications as recommended, maintaining respiratory and cardiovascular functions during activities.  Interventions:  . 1:1 collaboration with Copland, Gay Filler, MD regarding development and update of comprehensive plan of care as evidenced by provider attestation and co-signature . Inter-disciplinary care team collaboration (see longitudinal plan of care) . Advised patient to eat health meal plan . Provided education to patient re: healthy nutrition . Reviewed medications with patient and discussed client with upcoming appointment for medication management re: hypertension . Reviewed scheduled/upcoming provider appointments including:  . Discussed plans  with patient for ongoing care management follow up and provided patient with direct contact information for care  management team Discussed importance of keeping oxygen saturation at mid-high 90's. Discussed importance of healthy nutrition in healing and fuel for the body. Encouraged client to attend allergy clinic appointment as scheduled and continue recommendations by provider to keep allergies controlled. Patient Goals/Self-Care Activities Participate with physical therapy as recommended. Perform Activities of daily living independently. Take your blood oxygen readings try to keep your oxygen saturation mid-high 90's  Call your provider if you have any questions or concerns. Wear oxygen per pulmonary recommendation with exertional activity and with sleep/CPAP. Patient will self administer medications as prescribed. Patient will attend all scheduled provider appointments. Eat Healthy meal plan: foods low in salt and heart healthy meals full of fruits, vegetables, whole grains, lean protein and limit fat, and sugars. It may also help to eat smaller portions 5-6 time a day if possible.  Discuss appetite with your provider at the follow up appointment 05/29/20, if appetite has not improved. Patient will call provider office for new concerns or questions.  Follow Up Plan: The patient has been provided with contact information for the care management team and has been advised to call with any health related questions or concerns.  The care management team will reach out to the patient again over the next 30 days.      Patient Care Plan: Hypertension (Adult)    Problem Identified: Disease Progression (Hypertension)     Long-Range Goal: Disease Progression Prevented or Minimized   Start Date: 05/08/2020  Expected End Date: 08/06/2020  This Visit's Progress: On track  Priority: Medium  Note:   Objective:  . Last practice recorded BP readings:  BP Readings from Last 3 Encounters:  05/01/20 (!) 160/78  04/24/20 140/70  04/20/20 (!) 150/72   Current Barriers:  Marland Kitchen Knowledge Deficits related to long term  self-management of hypertension. Client reports he blood pressure has been going up and down. She reports she has seen cardiologist and that her metoprolol was adjusted to 50 mg two tablets daily. She reports she has an appointment next Monday with the pharmacist in the cardiologist office. She states she checks her blood pressure daily. She states her blood pressure today was 142/80 HR 71 and the reading by her physical therapist was 147/60 HR 80.  Marland Kitchen Case Manager Clinical Goal(s):  . patient will verbalize understanding of plan for hypertension management . patient will attend all scheduled medical appointments: Client reports upcoming appointment with pharmacist at cardiologist office on Monday 05/15/20 for medication management; next appointment with PCP 05/29/20 . patient will demonstrate improved adherence to prescribed treatment plan for hypertension as evidenced by taking all medications as prescribed, monitoring and recording blood pressure as directed, adhering to low sodium/DASH diet; taking medications as prescribed; knowing target parameters for blood pressure and calling provider with questions/concerns as needed. Interventions:  . Collaboration with Copland, Gay Filler, MD regarding development and update of comprehensive plan of care as evidenced by provider attestation and co-signature . Inter-disciplinary care team collaboration (see longitudinal plan of care) . Provided education to patient re: DASH diet and Healthy Eating . Reviewed medications with patient and discussed importance of compliance . Discussed plans with patient for ongoing care management follow up and provided patient with direct contact information for care management team . Reviewed scheduled/upcoming provider appointments including:  . Discussed steps to ensure most accurate blood pressure reading including: sit one approximately 5 minutes prior to  checking, keep feet flat on the floor, keeping are at hear level,  remaining still and quite while monitor is taking reading. . Discussed importance of monitoring salt intake and having a healthy diet including variety of foods in her meal plan. . Client encouraged to find out Target blood pressure range from provider at next visit. Self-Care Activities: - Self administers medications as prescribed -Attends all scheduled provider appointments -Calls provider office for new concerns, questions -Checks BP and records daily Patient Goals: - check blood pressure as recommended by provider - write blood pressure results in a log or diary and take to provider appointments. -take your blood pressure cuff to provider office to check accuracy of your blood pressure monitor. -attend provider visits as scheduled -plan to eat low salt and heart healthy meals with fruits, vegetables, whole grains, lean protein and limit fats and sugars. -increase activity as tolerated and recommended by your providers. Continue to work with your home health physical therapist as scheduled. -Review educational material on DASH diet. -ask provider "what is my Target blood pressure goal?" and "what is my Target blood pressure range?" at next visit.  Follow Up Plan: The patient has been provided with contact information for the care management team and has been advised to call with any health related questions or concerns.  The care management team will reach out to the patient again over the next 30 days.

## 2020-05-08 NOTE — Chronic Care Management (AMB) (Unsigned)
Chronic Care Management   CCM RN Visit Note  05/09/2020 Name: Stephanie Parks MRN: [id number] DOB: 1946-01-30  Subjective: Stephanie Parks is a 75 y.o. year old female who is a primary care patient of Copland, Gay Filler, MD. The care management team was consulted for assistance with disease management and care coordination needs.    Engaged with patient by telephone for initial visit in response to provider referral for case management and/or care coordination services.   Consent to Services:  The patient was given the following information about Chronic Care Management services today, agreed to services, and gave verbal consent: 1. CCM service includes personalized support from designated clinical staff supervised by the primary care provider, including individualized plan of care and coordination with other care providers 2. 24/7 contact phone numbers for assistance for urgent and routine care needs. 3. Service will only be billed when office clinical staff spend 20 minutes or more in a month to coordinate care. 4. Only one practitioner may furnish and bill the service in a calendar month. 5.The patient may stop CCM services at any time (effective at the end of the month) by phone call to the office staff. 6. The patient will be responsible for cost sharing (co-pay) of up to 20% of the service fee (after annual deductible is met). Patient agreed to services and consent obtained.  Patient agreed to services and verbal consent obtained.   Assessment: Review of patient past medical history, allergies, medications, health status, including review of consultants reports, laboratory and other test data, was performed as part of comprehensive evaluation and provision of chronic care management services.    CCM Care Plan  Allergies  Allergen Reactions  . Shellfish Allergy Hives and Swelling  . Ace Inhibitors Other (See Comments)    Pt cannot recall her reaction but tolerates arb   . Gabapentin  Other (See Comments)    headaches  . Pitavastatin     Unknown reaction   . Topiramate     Heart race,   . Sulfa Antibiotics Other (See Comments) and Rash    Fine bumps    Outpatient Encounter Medications as of 05/08/2020  Medication Sig Note  . albuterol (PROAIR HFA) 108 (90 Base) MCG/ACT inhaler Inhale 2 puffs into the lungs every 4 (four) hours as needed for wheezing or shortness of breath.   Marland Kitchen apixaban (ELIQUIS) 5 MG TABS tablet Take 1 tablet (5 mg total) by mouth 2 (two) times daily.   Marland Kitchen ascorbic acid (VITAMIN C) 500 MG tablet Take 500 mg by mouth daily.   . Calcium Carb-Cholecalciferol (CALCIUM 600 + D PO) Take 1 tablet by mouth daily.   Marland Kitchen ELDERBERRY PO Take 50 mg by mouth daily.   Marland Kitchen EPINEPHrine (EPIPEN 2-PAK) 0.3 mg/0.3 mL IJ SOAJ injection Inject 0.3 mLs (0.3 mg total) into the muscle as needed for anaphylaxis. Per allergen immunotherapy protocol   . fluticasone (FLONASE) 50 MCG/ACT nasal spray USE 2 SPRAYS IN NOSTRIL(S) DAILY AS NEEDED.  In the right nostril point the applicator out toward your right ear.  In the left nostril point the applicator out toward her left ear (Patient taking differently: Place 2 sprays into both nostrils daily as needed for allergies.)   . losartan (COZAAR) 100 MG tablet Take 1 tablet (100 mg total) by mouth daily. 05/08/2020: Taking two 50 mg tablets for a total 123m/day.  . metoprolol succinate (TOPROL-XL) 25 MG 24 hr tablet Take 0.5 tablets (12.5 mg total) by mouth daily.   .Marland Kitchen  Multiple Vitamins-Minerals (MULTIVITAL) tablet Take 1 tablet by mouth daily.    . pantoprazole (PROTONIX) 40 MG tablet Take 1 tablet (40 mg total) by mouth 2 (two) times daily.   . potassium chloride SA (KLOR-CON M20) 20 MEQ tablet Alternate days-take 2 tablets one day and then 1 tablet the next   . rizatriptan (MAXALT-MLT) 10 MG disintegrating tablet Take 10 mg by mouth as needed for migraine. May repeat in 2 hours if needed   . rosuvastatin (CRESTOR) 10 MG tablet TAKE 1 TABLET BY  MOUTH EVERY DAY (Patient taking differently: Take 10 mg by mouth daily.)   . zinc gluconate 50 MG tablet Take 50 mg by mouth daily.    No facility-administered encounter medications on file as of 05/08/2020.    Patient Active Problem List   Diagnosis Date Noted  . Pneumonia due to COVID-19 virus 03/12/2020  . Seasonal allergic conjunctivitis 10/04/2019  . Dysfunction of right eustachian tube 10/04/2019  . Pre-diabetes 06/02/2019  . Seasonal and perennial allergic rhinitis 12/22/2018  . Heart palpitations 12/22/2018  . History of chest pain 07/20/2018  . Brain aneurysm 07/20/2018  . Bronchitis 04/23/2018  . Tendonitis, Achilles, right 12/08/2017  . Onychomycosis 12/08/2017  . Unilateral primary osteoarthritis, left knee 10/07/2017  . Ingrown toenail 08/10/2017  . Coughing 04/24/2017  . CAD (coronary artery disease) 03/01/2015  . Aneurysm ([place]) 03/01/2015  . Dizziness 03/01/2015  . Paresthesia of arm 03/01/2015  . Allergic rhinitis 03/01/2015  . Achalasia 09/28/2012  . CN (constipation) 09/28/2012  . Cephalalgia 08/24/2012  . HTN (hypertension) 04/19/2011  . Hyperlipidemia 04/19/2011  . GERD (gastroesophageal reflux disease) 04/19/2011  . Chest pain 04/04/2011    Conditions to be addressed/monitored:HTN and post Covid pneumonia signs/symptoms  Care Plan : Post Covid Pneumonia  Updates made by Luretha Rued, RN since 05/08/2020 12:00 AM  Problem: Decrease in Activity and Endurance 2 months Covid   Priority: Medium  Long-Range Goal: Development of long term plan for managment of ongoing symptoms post covid pneumonia in a patient with OSA, chronic seasonal allergies, deviated nasal septum   Start Date: 05/08/2020  Expected End Date: 08/06/2020  This Visit's Progress: On track  Priority: Medium  Current Barriers:  Marland Kitchen Knowledge Deficits related to long term plan for management for ongoing signs/symptoms post Covid pneumonia . Patient hospitalized 03/12/20-03/21/20 with Covid  pneumonia. She reports that she is much better since discharge from hospitalization. But states her activity level is not back to before hospitalization. She reports office visit with pulmonologist on 05/02/20. And she is participating with home health physical therapist for increase activity/stamina. She uses oxygen, but does not need it during the day, only at night. She reports a decreased appetite since Covid 19 infection and does not eat a lot. RNCM discussed the importance of good nutrition and encouraged client to work on eating nutritious foods.  Nurse Case Manager Clinical Goal(s):  . patient will verbalize understanding of plan for improvement of over time, improve ability to use energy conservation and management techniques. . patient will work with physical therapist to address needs related to increase mobility and endurance . patient will attend all scheduled medical appointments: next visit with primary care provider scheduled for 05/29/20. . the patient will demonstrate ongoing self health care management ability as evidenced by continuing to attend provider visits, taking medications as recommended, maintaining respiratory and cardiovascular functions during activities.  Interventions:  . 1:1 collaboration with Copland, Gay Filler, MD regarding development and update of comprehensive plan  of care as evidenced by provider attestation and co-signature . Inter-disciplinary care team collaboration (see longitudinal plan of care) . Advised patient to eat health meal plan . Provided education to patient re: healthy nutrition . Reviewed medications with patient and discussed client with upcoming appointment for medication management re: hypertension . Reviewed scheduled/upcoming provider appointments including:  . Discussed plans with patient for ongoing care management follow up and provided patient with direct contact information for care management team Discussed importance of keeping  oxygen saturation at mid-high 90's. Discussed importance of healthy nutrition in healing and fuel for the body. Encouraged client to attend allergy clinic appointment as scheduled and continue recommendations by provider to keep allergies controlled. Patient Goals/Self-Care Activities Participate with physical therapy as recommended. Perform Activities of daily living independently. Take your blood oxygen readings try to keep your oxygen saturation mid-high 90's  Call your provider if you have any questions or concerns. Wear oxygen per pulmonary recommendation with exertional activity and with sleep/CPAP. Patient will self administer medications as prescribed. Patient will attend all scheduled provider appointments. Eat Healthy meal plan: foods low in salt and heart healthy meals full of fruits, vegetables, whole grains, lean protein and limit fat, and sugars. It may also help to eat smaller portions 5-6 time a day if possible.  Discuss appetite with your provider at the follow up appointment 05/29/20, if appetite has not improved. Patient will call provider office for new concerns or questions.  Follow Up Plan: The patient has been provided with contact information for the care management team and has been advised to call with any health related questions or concerns.  The care management team will reach out to the patient again over the next 30 days.    Care Plan : Hypertension (Adult)  Updates made by Luretha Rued, RN since 05/08/2020 12:00 AM    Problem: Disease Progression (Hypertension)     Long-Range Goal: Disease Progression Prevented or Minimized   Start Date: 05/08/2020  Expected End Date: 08/06/2020  This Visit's Progress: On track  Priority: Medium  Note:   Objective:  . Last practice recorded BP readings:  BP Readings from Last 3 Encounters:  05/01/20 (!) 160/78  04/24/20 140/70  04/20/20 (!) 150/72   Current Barriers:  Marland Kitchen Knowledge Deficits related to long term  self-management of hypertension. Client reports he blood pressure has been going up and down. She reports she has seen cardiologist and that her metoprolol was adjusted to 50 mg two tablets daily. She reports she has an appointment next Monday with the pharmacist in the cardiologist office. She states she checks her blood pressure daily. She states her blood pressure today was 142/80 HR 71 and the reading by her physical therapist was 147/60 HR 80.  Marland Kitchen Case Manager Clinical Goal(s):  . patient will verbalize understanding of plan for hypertension management . patient will attend all scheduled medical appointments: Client reports upcoming appointment with pharmacist at cardiologist office on Monday 05/15/20 for medication management; next appointment with PCP 05/29/20 . patient will demonstrate improved adherence to prescribed treatment plan for hypertension as evidenced by taking all medications as prescribed, monitoring and recording blood pressure as directed, adhering to low sodium/DASH diet; taking medications as prescribed; knowing target parameters for blood pressure and calling provider with questions/concerns as needed. Interventions:  . Collaboration with Copland, Gay Filler, MD regarding development and update of comprehensive plan of care as evidenced by provider attestation and co-signature . Inter-disciplinary care team collaboration (see longitudinal plan  of care) . Provided education to patient re: DASH diet and Healthy Eating . Reviewed medications with patient and discussed importance of compliance . Discussed plans with patient for ongoing care management follow up and provided patient with direct contact information for care management team . Reviewed scheduled/upcoming provider appointments including:  . Discussed steps to ensure most accurate blood pressure reading including: sit one approximately 5 minutes prior to checking, keep feet flat on the floor, keeping are at hear level,  remaining still and quite while monitor is taking reading. . Discussed importance of monitoring salt intake and having a healthy diet including variety of foods in her meal plan. . Client encouraged to find out Target blood pressure range from provider at next visit. Self-Care Activities: - Self administers medications as prescribed -Attends all scheduled provider appointments -Calls provider office for new concerns, questions -Checks BP and records daily Patient Goals: - check blood pressure as recommended by provider - write blood pressure results in a log or diary and take to provider appointments. -take your blood pressure cuff to provider office to check accuracy of your blood pressure monitor. -attend provider visits as scheduled -plan to eat low salt and heart healthy meals with fruits, vegetables, whole grains, lean protein and limit fats and sugars. -increase activity as tolerated and recommended by your providers. Continue to work with your home health physical therapist as scheduled. -Review educational material on DASH diet. -ask provider "what is my Target blood pressure goal?" and "what is my Target blood pressure range?" at next visit.  Follow Up Plan: The patient has been provided with contact information for the care management team and has been advised to call with any health related questions or concerns.  The care management team will reach out to the patient again over the next 30 days.     Plan:The patient has been provided with contact information for the care management team and has been advised to call with any health related questions or concerns.  and The care management team will reach out to the patient again over the next 30 days.  Thea Silversmith, RN, MSN, BSN, CCM Care Management Coordinator [facility] [phone]

## 2020-05-10 ENCOUNTER — Other Ambulatory Visit: Payer: Self-pay | Admitting: Internal Medicine

## 2020-05-10 NOTE — Progress Notes (Addendum)
[facility] [address] [place], [place] [address] [phone]  Date:  05/11/2020   Name:  Stephanie Parks   DOB:  Aug 03, 1945   MRN:  [id number]  PCP:  [name], MD    Chief Complaint: Blood In Stools (One episode of abdominal pain)   History of Present Illness:  Stephanie Parks is a 75 y.o. very pleasant female patient who presents with the following:  Pt here today with concern of blood in stool  Last seen by myself earlier this month for pneumonia follow-up She recently had covid 19 pneumonia complicated with PE and oxygen use - from last visit: Marcea recently had YCXKG-81 pneumonia complicated by pulmonary embolism.  She is now off daytime oxygen but continues to use it at night.  She is on anticoagulation, with plan for a total of 3 months We will repeat chest film today Blood pressure under fine control Discussed her anxiety, Dollye has had many episodes of ER visit due to concern about her blood pressures.  She has recently decided to stop checking her blood pressures at home which I think is a good idea.  Explained to her blood pressure may certainly vary, but is typically under okay control we see her in the office.  For anxiety we will have her start on BuSpar 7.5 twice a day-she will let me know how this works for her  Referral to nephrology to discuss chronic hypokalemia, uncertain etiology We will check K today, she has been taking 40 mEq of potassium daily for the last few days  Last K 3.6  Most recent chest film 3/7: EXAM: CHEST - 2 VIEW COMPARISON:  April 06, 2020 FINDINGS: Mild to moderate severity diffusely increased interstitial lung markings are seen bilaterally. This is most prominent along the periphery of both lungs and is mildly decreased in severity when compared to the prior study. There is no evidence of a pleural effusion or pneumothorax. The heart size and  mediastinal contours are within normal limits. A radiopaque fusion plate and screws are seen overlying the cervical spine. The visualized skeletal structures are otherwise unremarkable. IMPRESSION: Mild to moderate severity bilateral interstitial infiltrates, mildly decreased in severity when compared to the prior study.  She noted blood in her stool yesterday.  Nothing on the tissue or in the toilet water-she thought there was blood mixed in the stool She has seen blood twice she thinks  No pain with her BM She has some stomach cramping a few days ago but none since No epistaxis, no hematuria   Her breathing is "up and down" She has some SOB with exercise  She is just using oxygen at night still  She saw pulmonology at [facility] 10 days ago They want her to do pulmonary rehab but cost is a concern; Per pulmonology notes:  -You do not need any oxygen at rest however will need 2 L of oxygen when you are exerting yourself and when you are sleeping with her CPAP. -I recommend you continue doing physical activities as much as you tolerate. -I will be sending you to the [facility] cardiopulmonary rehabilitation program to get you off the oxygen and improve your shortness of breath   Assessment:  1.acute hypoxic respiratory failure 2.post Covid dyspnea 3.OSA on CPAP  75 year old female with very limited smoking history and allergic rhinitis who unfortunately contracted a severe bout of COVID-19 pneumonia in spite of being vaccinated  and requiring hospitalization on very high flow nasal cannula. Subsequently she improved and was discharged home on 4 L. Patient currently is doing much better but still gets dyspneic on moderate to severe exertion but not with minor activities. No prior pulmonary disease  Plan:  1.acute hypoxic respiratory failure: -Secondary to COVID-19 pneumonia. -6-minute walk test done in the clinic today -Oxygen saturation at rest on room air 99% however  dropped to 87% on walking on room air and required 2 L to bring it up to 93%. -Recommend no oxygen at rest or with minor activities and 2 L of oxygen with exertional activities and sleep.  2.post Covid dyspnea: -Patient seem to have a severe bout of COVID-19 pneumonia. -Cannot view the images with report seems to suggest significant interstitial disease. -However anticipate that she will get off oxygen soon. -Would recommend cardiopulmonary rehabilitation at [facility].   3.acute PE: -Provoked by COVID-19. -3 months of anticoagulation with Eliquis Thank you for the opportunity to provide consultation for your patient. If I can be of further assistance please do not hesitate to contact my office.        Patient Active Problem List   Diagnosis Date Noted  . Pneumonia due to COVID-19 virus 03/12/2020  . Seasonal allergic conjunctivitis 10/04/2019  . Dysfunction of right eustachian tube 10/04/2019  . Pre-diabetes 06/02/2019  . Seasonal and perennial allergic rhinitis 12/22/2018  . Heart palpitations 12/22/2018  . History of chest pain 07/20/2018  . Brain aneurysm 07/20/2018  . Bronchitis 04/23/2018  . Tendonitis, Achilles, right 12/08/2017  . Onychomycosis 12/08/2017  . Unilateral primary osteoarthritis, left knee 10/07/2017  . Ingrown toenail 08/10/2017  . Coughing 04/24/2017  . CAD (coronary artery disease) 03/01/2015  . Aneurysm ([place]) 03/01/2015  . Dizziness 03/01/2015  . Paresthesia of arm 03/01/2015  . Allergic rhinitis 03/01/2015  . Achalasia 09/28/2012  . CN (constipation) 09/28/2012  . Cephalalgia 08/24/2012  . HTN (hypertension) 04/19/2011  . Hyperlipidemia 04/19/2011  . GERD (gastroesophageal reflux disease) 04/19/2011  . Chest pain 04/04/2011    Past Medical History:  Diagnosis Date  . 3-vessel CAD 03/01/2015  . Achalasia 09/28/2012  . Allergic rhinitis 03/01/2015  . Anemia   . Aneurysm ([place]) 03/01/2015  . BP (high blood pressure) 03/01/2015   . Bronchitis 04/23/2018  . Cephalalgia 08/24/2012   Overview:  ICD-10 cut over    . Chest pain 04/04/2011  . CN (constipation) 09/28/2012  . Coughing 04/24/2017  . Decreased potassium in the blood 03/01/2015  . Diverticulosis   . Dizziness 03/01/2015  . GERD (gastroesophageal reflux disease)   . Hiatal hernia   . Hypercholesterolemia 03/01/2015  . Hyperlipidemia   . Hypertension   . Ingrown toenail 08/10/2017  . Inguinal hernia   . Migraine headache   . Onychomycosis 12/08/2017  . Paresthesia of arm 03/01/2015  . Schatzki's ring   . Tendonitis, Achilles, right 12/08/2017  . Unilateral primary osteoarthritis, left knee 10/07/2017    Past Surgical History:  Procedure Laterality Date  . ABDOMINAL HYSTERECTOMY    . BACK SURGERY     x 3  . CARPAL TUNNEL RELEASE     left wrist  . CERVICAL SPINE SURGERY    . HEMORRHOID SURGERY      Social History   Tobacco Use  . Smoking status: Former Smoker    Packs/day: 0.50    Years: 20.00    Pack years: 10.00    Types: Cigarettes    Quit date: 02/19/1984  Years since quitting: 36.2  . Smokeless tobacco: Never Used  Vaping Use  . Vaping Use: Never used  Substance Use Topics  . Alcohol use: No  . Drug use: No    Family History  Problem Relation Age of Onset  . Allergic rhinitis Sister   . Diabetes Sister   . Hypertension Sister   . Heart attack Father 67  . Heart disease Father   . Stomach cancer Maternal Grandmother   . Heart disease Mother   . Colon cancer Neg Hx   . Angioedema Neg Hx   . Asthma Neg Hx   . Eczema Neg Hx   . Urticaria Neg Hx   . Immunodeficiency Neg Hx   . Breast cancer Neg Hx   . Migraines Neg Hx   . Headache Neg Hx     Allergies  Allergen Reactions  . Shellfish Allergy Hives and Swelling  . Ace Inhibitors Other (See Comments)    Pt cannot recall her reaction but tolerates arb   . Gabapentin Other (See Comments)    headaches  . Pitavastatin     Unknown reaction   . Topiramate     Heart race,   .  Sulfa Antibiotics Other (See Comments) and Rash    Fine bumps    Medication list has been reviewed and updated.  Current Outpatient Medications on File Prior to Visit  Medication Sig Dispense Refill  . albuterol (PROAIR HFA) 108 (90 Base) MCG/ACT inhaler Inhale 2 puffs into the lungs every 4 (four) hours as needed for wheezing or shortness of breath. 1 each 1  . apixaban (ELIQUIS) 5 MG TABS tablet Take 1 tablet (5 mg total) by mouth 2 (two) times daily. 60 tablet 1  . ascorbic acid (VITAMIN C) 500 MG tablet Take 500 mg by mouth daily.    . Calcium Carb-Cholecalciferol (CALCIUM 600 + D PO) Take 1 tablet by mouth daily.    Marland Kitchen ELDERBERRY PO Take 50 mg by mouth daily.    Marland Kitchen EPINEPHrine (EPIPEN 2-PAK) 0.3 mg/0.3 mL IJ SOAJ injection Inject 0.3 mLs (0.3 mg total) into the muscle as needed for anaphylaxis. Per allergen immunotherapy protocol 1 each 2  . fluticasone (FLONASE) 50 MCG/ACT nasal spray USE 2 SPRAYS IN NOSTRIL(S) DAILY AS NEEDED.  In the right nostril point the applicator out toward your right ear.  In the left nostril point the applicator out toward her left ear (Patient taking differently: Place 2 sprays into both nostrils daily as needed for allergies.) 16 g 9  . losartan (COZAAR) 100 MG tablet Take 1 tablet (100 mg total) by mouth daily. 30 tablet 5  . metoprolol succinate (TOPROL-XL) 25 MG 24 hr tablet Take 0.5 tablets (12.5 mg total) by mouth daily. 90 tablet 1  . Multiple Vitamins-Minerals (MULTIVITAL) tablet Take 1 tablet by mouth daily.     . pantoprazole (PROTONIX) 40 MG tablet TAKE 1 TABLET BY MOUTH TWICE A DAY 180 tablet 1  . potassium chloride SA (KLOR-CON M20) 20 MEQ tablet Alternate days-take 2 tablets one day and then 1 tablet the next 60 tablet 1  . rizatriptan (MAXALT-MLT) 10 MG disintegrating tablet Take 10 mg by mouth as needed for migraine. May repeat in 2 hours if needed    . rosuvastatin (CRESTOR) 10 MG tablet TAKE 1 TABLET BY MOUTH EVERY DAY (Patient taking differently:  Take 10 mg by mouth daily.) 90 tablet 3  . zinc gluconate 50 MG tablet Take 50 mg by mouth daily.  No current facility-administered medications on file prior to visit.    Review of Systems:  As per HPI- otherwise negative.   Physical Examination: Vitals:   05/11/20 0957  BP: 132/74  Pulse: 75  Resp: 17  Temp: 98.5 F (36.9 C)  SpO2: 95%   Vitals:   05/11/20 0957  Weight: 191 lb (86.6 kg)  Height: 5\' 5"  (1.651 m)   Body mass index is 31.78 kg/m. Ideal Body Weight: Weight in (lb) to have BMI = 25: 149.9  GEN: no acute distress.  Looks well and her normal self.  Mild obesity HEENT: Atraumatic, Normocephalic.  Ears and Nose: No external deformity. CV: RRR, No M/G/R. No JVD. No thrill. No extra heart sounds. PULM: CTA B, no wheezes, crackles, rhonchi. No retractions. No resp. distress. No accessory muscle use. ABD: S, NT, ND, +BS. No rebound. No HSM. EXTR: No c/c/e PSYCH: Normally interactive. Conversant.  No gross blood on DRE Hemoccult also negative  .   Assessment and Plan  Hypertension, unspecified type - Plan: Basic metabolic panel  Pneumonia due to COVID-19 virus  Other acute pulmonary embolism, unspecified whether acute cor pulmonale present ([place])  Blood in stool - Plan: CBC, POCT Occult Blood Stool  Here today for follow-up visit.  Aryah unfortunately had YQMVH-84 pneumonia complicated by pulmonary embolism.  She is taking Eliquis and seems to be improving.  She is only using oxygen at night She did have an episode of possible blood in her stool yesterday.  Hemoccult today is negative.  We will check her CBC, I have asked her let me know if any further episodes.  She did have a colonoscopy in 2020 We discussed repeating her chest x-ray today, she would like to wait until next visit which I think is fine Her pulmonologist has referred her to pulmonary rehab, but this may be too expensive for her to undertake Appointment with me scheduled for next  month This visit occurred during the SARS-CoV-2 public health emergency.  Safety protocols were in place, including screening questions prior to the visit, additional usage of staff PPE, and extensive cleaning of exam room while observing appropriate contact time as indicated for disinfecting solutions.    Signed Lamar Blinks, MD  Results for orders placed or performed in visit on 05/11/20  POCT Occult Blood Stool  Result Value Ref Range   Fecal Occult Blood, POC Negative Negative   Card #1 Date [id number]    Card #2 Fecal Occult Blod, POC     Card #2 Date     Card #3 Fecal Occult Blood, POC     Card #3 Date     Received her labs as below, message to pt  Results for orders placed or performed in visit on 05/11/20  CBC  Result Value Ref Range   WBC 6.4 4.0 - 10.5 K/uL   RBC 3.72 (L) 3.87 - 5.11 Mil/uL   Platelets 385.0 150.0 - 400.0 K/uL   Hemoglobin 10.9 (L) 12.0 - 15.0 g/dL   HCT 32.4 (L) 36.0 - 46.0 %   MCV 87.2 78.0 - 100.0 fl   MCHC 33.5 30.0 - 36.0 g/dL   RDW 17.9 (H) 11.5 - 69.6 %  Basic metabolic panel  Result Value Ref Range   Sodium 144 135 - 145 mEq/L   Potassium 3.0 (L) 3.5 - 5.1 mEq/L   Chloride 104 96 - 112 mEq/L   CO2 32 19 - 32 mEq/L   Glucose, Bld 96 70 - 99 mg/dL  BUN 8 6 - 23 mg/dL   Creatinine, Ser 0.77 0.40 - 1.20 mg/dL   GFR 75.90 >60.00 mL/min   Calcium 9.1 8.4 - 10.5 mg/dL  POCT Occult Blood Stool  Result Value Ref Range   Fecal Occult Blood, POC Negative Negative   Card #1 Date [id number]    Card #2 Fecal Occult Blod, POC     Card #2 Date     Card #3 Fecal Occult Blood, POC     Card #3 Date

## 2020-05-11 ENCOUNTER — Other Ambulatory Visit: Payer: Self-pay

## 2020-05-11 ENCOUNTER — Encounter: Payer: Self-pay | Admitting: Family Medicine

## 2020-05-11 ENCOUNTER — Ambulatory Visit (INDEPENDENT_AMBULATORY_CARE_PROVIDER_SITE_OTHER): Payer: Medicare Other | Admitting: Family Medicine

## 2020-05-11 VITALS — BP 132/74 | HR 75 | Temp 98.5°F | Resp 17 | Ht 65.0 in | Wt 191.0 lb

## 2020-05-11 DIAGNOSIS — I2699 Other pulmonary embolism without acute cor pulmonale: Secondary | ICD-10-CM | POA: Diagnosis not present

## 2020-05-11 DIAGNOSIS — K921 Melena: Secondary | ICD-10-CM | POA: Diagnosis not present

## 2020-05-11 DIAGNOSIS — U071 COVID-19: Secondary | ICD-10-CM | POA: Diagnosis not present

## 2020-05-11 DIAGNOSIS — J1282 Pneumonia due to coronavirus disease 2019: Secondary | ICD-10-CM

## 2020-05-11 DIAGNOSIS — I10 Essential (primary) hypertension: Secondary | ICD-10-CM | POA: Diagnosis not present

## 2020-05-11 LAB — CBC
HCT: 32.4 % — ABNORMAL LOW (ref 36.0–46.0)
Hemoglobin: 10.9 g/dL — ABNORMAL LOW (ref 12.0–15.0)
MCHC: 33.5 g/dL (ref 30.0–36.0)
MCV: 87.2 fl (ref 78.0–100.0)
Platelets: 385 10*3/uL (ref 150.0–400.0)
RBC: 3.72 Mil/uL — ABNORMAL LOW (ref 3.87–5.11)
RDW: 17.9 % — ABNORMAL HIGH (ref 11.5–15.5)
WBC: 6.4 10*3/uL (ref 4.0–10.5)

## 2020-05-11 LAB — BASIC METABOLIC PANEL
BUN: 8 mg/dL (ref 6–23)
CO2: 32 mEq/L (ref 19–32)
Calcium: 9.1 mg/dL (ref 8.4–10.5)
Chloride: 104 mEq/L (ref 96–112)
Creatinine, Ser: 0.77 mg/dL (ref 0.40–1.20)
GFR: 75.9 mL/min (ref >60.00)
Glucose, Bld: 96 mg/dL (ref 70–99)
Potassium: 3 mEq/L — ABNORMAL LOW (ref 3.5–5.1)
Sodium: 144 mEq/L (ref 135–145)

## 2020-05-11 LAB — HEMOCCULT GUIAC POC 1CARD (OFFICE)
Card #1 Date: 3242022
Fecal Occult Blood, POC: NEGATIVE

## 2020-05-11 NOTE — Patient Instructions (Signed)
It was good to see you again today I will be in touch with your labs asap Please continue to work on gradually increasing your exercise capacity Let me know if you see any more blood in your stool!  Please see me in about 8 weeks to check on your progress- sooner if you need anything or if you are feeling worse

## 2020-05-13 NOTE — Progress Notes (Signed)
Patient ID: NIMRIT KEHRES                 DOB: 09-12-1945                      MRN: [id number]     HPI: Stephanie Parks is a 75 y.o. female referred by Dr. Irish Lack to HTN clinic. PMH is significant for HTN, HLD, h/o brain aneurysm, hypokalemia, chest pain, prediabetes. Cath in 2020 showed normal coronaries. Echo 03/15/20 showed LVEF 55-60%. Of note, patient has allergy listed to ACE inhibitors (pt cannot recall reaction) but tolerates ARBs.   Patient was admitted 02/2020 for COVID-19 pneumonia. Discharged on 2L oxygen with activity, which she still requires. Dx with PE - 3 months of anticoagulation with Eliquis planned. HCTZ-triamterene discontinued at discharge as patient had normal BP. Presented to ED 3/3 for dizziness, reported difficulty controlling BP recently. BP was 150/72. Head CT showed no bleeding. Stable for d/c home with outpatient follow up. Presented to [facility] urgent care 3/9 for intermittent left-sided chest pain, consistent with non-cardiac causes. BP was 162/81. ECG showed NSR with consideration of old anterior infarct. Referred to cardiology.   Saw Dr. Irish Lack 3/14, BP was 160/78. History of atypical chest discomfort. Has had palpitations, monitor in 2018 showed NSR with occasional PACs and PVCs. Patient reports stress with job at [employer]. Reported chest pain reduced with belching. Patient admits to "going into a panic" when BP is high at home. MD suspects anxiety plays large role in her symptoms. Encouraged her to eat less canned foods and increase walking. Losartan increased from 50 mg to 100 mg. Of note, patient with history of hypokalemia, takes KCl 40 mEq per day. BP at PCP visit 3/24 was 132/74, current BP medications continued. Patient reported she recently stopped checking BP at home due to anxiety. Started on Buspar 7.5 mg BID. Referred to nephrology for evaluation of hypokalemia.   Today, patients arrives for visit bringing BP cuff and log of BP readings. She checks her BP  using bicep cuff once daily an hour after taking her medications. Technique is appropriate. BP on home cuff today in office was 158/82, HR 77. Using office cuff was 158/78, HR 75. Average home reading is 141/66 (range 124/57 to 159/75) with most HR readings in 60s-70s (range 66 to 96). Reports she experiences lightheadedness/ dizziness 2-3 times per week, but it seems to be random, not necessarily correlating to a low or high BP reading. Reports occasional headaches. Has some blurred vision which she is going to the eye doctor for tomorrow. Seeing nephrology on Thursday for her hypokalemia. Currently taking KCl 20 mEq twice daily. She has not started taking Buspar because she did not want to add any more medications to her regimen.   Current HTN meds: losartan 100 mg daily (7am), metoprolol succinate 12.5 mg daily (8am) Previously tried: triamterene-HCTZ (discontinued at 02/2020 discharge) BP goal: <130/65mmHg  Family History: Diabetes in her sister; Heart attack (age of onset: 57) in her father; Heart disease in her father and mother; Hypertension in her sister  Social History: Former smoker (quit 1986)  Diet: Appetite comes and goes. Eats breakfast and dinner. Working up to eating more vegetables and fruit. No salt added to foods. Coffee once or twice a week because daily was making her heart race.   Exercise: Physical therapy s/p COVID   Home BP readings: Checks once daily, 1 hour after taking medications; uses [place] bicep cuff 159/75, 158/67, 130/59,  142/60, 159/73, 140/67, 142/80, 124/57, 126/58, 138/63, 140/65, 143/69, 144/69, 135/64 HR ranges from 66 to 96; most often in 60s-70s  Labs:  -05/11/20: Scr 0.77, Na 144, K 3.0 (losartan 100 mg, Kcl 40 mEq) -04/24/20: Scr 0.78, Na 146, K 3.6 (losartan 50 mg, Kcl 40 mEq)  Wt Readings from Last 3 Encounters:  05/11/20 191 lb (86.6 kg)  05/01/20 192 lb (87.1 kg)  04/24/20 191 lb (86.6 kg)   BP Readings from Last 3 Encounters:  05/11/20 132/74   05/01/20 (!) 160/78  04/24/20 140/70   Pulse Readings from Last 3 Encounters:  05/11/20 75  05/01/20 79  04/24/20 91    Renal function: Estimated Creatinine Clearance: 67 mL/min (by C-G formula based on SCr of 0.77 mg/dL).  Past Medical History:  Diagnosis Date  . 3-vessel CAD 03/01/2015  . Achalasia 09/28/2012  . Allergic rhinitis 03/01/2015  . Anemia   . Aneurysm ([place]) 03/01/2015  . BP (high blood pressure) 03/01/2015  . Bronchitis 04/23/2018  . Cephalalgia 08/24/2012   Overview:  ICD-10 cut over    . Chest pain 04/04/2011  . CN (constipation) 09/28/2012  . Coughing 04/24/2017  . Decreased potassium in the blood 03/01/2015  . Diverticulosis   . Dizziness 03/01/2015  . GERD (gastroesophageal reflux disease)   . Hiatal hernia   . Hypercholesterolemia 03/01/2015  . Hyperlipidemia   . Hypertension   . Ingrown toenail 08/10/2017  . Inguinal hernia   . Migraine headache   . Onychomycosis 12/08/2017  . Paresthesia of arm 03/01/2015  . Schatzki's ring   . Tendonitis, Achilles, right 12/08/2017  . Unilateral primary osteoarthritis, left knee 10/07/2017    Current Outpatient Medications on File Prior to Visit  Medication Sig Dispense Refill  . albuterol (PROAIR HFA) 108 (90 Base) MCG/ACT inhaler Inhale 2 puffs into the lungs every 4 (four) hours as needed for wheezing or shortness of breath. 1 each 1  . apixaban (ELIQUIS) 5 MG TABS tablet Take 1 tablet (5 mg total) by mouth 2 (two) times daily. 60 tablet 1  . ascorbic acid (VITAMIN C) 500 MG tablet Take 500 mg by mouth daily.    . Calcium Carb-Cholecalciferol (CALCIUM 600 + D PO) Take 1 tablet by mouth daily.    Marland Kitchen ELDERBERRY PO Take 50 mg by mouth daily.    Marland Kitchen EPINEPHrine (EPIPEN 2-PAK) 0.3 mg/0.3 mL IJ SOAJ injection Inject 0.3 mLs (0.3 mg total) into the muscle as needed for anaphylaxis. Per allergen immunotherapy protocol 1 each 2  . fluticasone (FLONASE) 50 MCG/ACT nasal spray USE 2 SPRAYS IN NOSTRIL(S) DAILY AS NEEDED.  In the right  nostril point the applicator out toward your right ear.  In the left nostril point the applicator out toward her left ear (Patient taking differently: Place 2 sprays into both nostrils daily as needed for allergies.) 16 g 9  . losartan (COZAAR) 100 MG tablet Take 1 tablet (100 mg total) by mouth daily. 30 tablet 5  . metoprolol succinate (TOPROL-XL) 25 MG 24 hr tablet Take 0.5 tablets (12.5 mg total) by mouth daily. 90 tablet 1  . Multiple Vitamins-Minerals (MULTIVITAL) tablet Take 1 tablet by mouth daily.     . pantoprazole (PROTONIX) 40 MG tablet TAKE 1 TABLET BY MOUTH TWICE A DAY 180 tablet 1  . potassium chloride SA (KLOR-CON M20) 20 MEQ tablet Alternate days-take 2 tablets one day and then 1 tablet the next 60 tablet 1  . rizatriptan (MAXALT-MLT) 10 MG disintegrating tablet Take 10 mg by  mouth as needed for migraine. May repeat in 2 hours if needed    . rosuvastatin (CRESTOR) 10 MG tablet TAKE 1 TABLET BY MOUTH EVERY DAY (Patient taking differently: Take 10 mg by mouth daily.) 90 tablet 3  . zinc gluconate 50 MG tablet Take 50 mg by mouth daily.     No current facility-administered medications on file prior to visit.    Allergies  Allergen Reactions  . Shellfish Allergy Hives and Swelling  . Ace Inhibitors Other (See Comments)    Pt cannot recall her reaction but tolerates arb   . Gabapentin Other (See Comments)    headaches  . Pitavastatin     Unknown reaction   . Topiramate     Heart race,   . Sulfa Antibiotics Other (See Comments) and Rash    Fine bumps     Assessment/Plan:  1. Hypertension - Blood pressure in office is above goal <130/72mmHg. Home cuff reads very similar to office cuff. Discussed various options with patient to help lower BP including switching losartan to irbesartan, switching metoprolol to carvedilol, adding amlodipine, and adding spironolactone to help with both BP and hypokalemia. Will only make one medication change today given patient is experiencing  some occasional dizziness. She does not want to add any medications at this time but is willing to switch ARBs. Will switch losartan 100 mg daily to irbesartan 300 mg daily. Continue metoprolol succinate 12.5 mg daily. Continue checking BP once daily and keep a log. Monitor for changes in frequency of dizzy episodes. Follow up office visit in 3 weeks to monitor BP and check bmet. Discussed with patient that changing to irbesartan is unlikely to bring BP to goal but is a good first step. Will consider switching metoprolol to carvedilol at next visit if BP is still above goal and pt is willing.  Stephanie Parks, PharmD PGY1 Pharmacy Resident 05/15/2020 4:28 PM

## 2020-05-15 ENCOUNTER — Other Ambulatory Visit: Payer: Self-pay

## 2020-05-15 ENCOUNTER — Other Ambulatory Visit: Payer: Medicare Other | Admitting: *Deleted

## 2020-05-15 ENCOUNTER — Ambulatory Visit (INDEPENDENT_AMBULATORY_CARE_PROVIDER_SITE_OTHER): Payer: Medicare Other | Admitting: Student-PharmD

## 2020-05-15 VITALS — BP 158/78 | HR 75

## 2020-05-15 DIAGNOSIS — I2510 Atherosclerotic heart disease of native coronary artery without angina pectoris: Secondary | ICD-10-CM | POA: Diagnosis not present

## 2020-05-15 DIAGNOSIS — M1712 Unilateral primary osteoarthritis, left knee: Secondary | ICD-10-CM | POA: Diagnosis not present

## 2020-05-15 DIAGNOSIS — I10 Essential (primary) hypertension: Secondary | ICD-10-CM

## 2020-05-15 DIAGNOSIS — J1282 Pneumonia due to coronavirus disease 2019: Secondary | ICD-10-CM | POA: Diagnosis not present

## 2020-05-15 DIAGNOSIS — K5790 Diverticulosis of intestine, part unspecified, without perforation or abscess without bleeding: Secondary | ICD-10-CM | POA: Diagnosis not present

## 2020-05-15 DIAGNOSIS — Z87898 Personal history of other specified conditions: Secondary | ICD-10-CM | POA: Diagnosis not present

## 2020-05-15 DIAGNOSIS — M7661 Achilles tendinitis, right leg: Secondary | ICD-10-CM | POA: Diagnosis not present

## 2020-05-15 DIAGNOSIS — D649 Anemia, unspecified: Secondary | ICD-10-CM | POA: Diagnosis not present

## 2020-05-15 DIAGNOSIS — Z7901 Long term (current) use of anticoagulants: Secondary | ICD-10-CM | POA: Diagnosis not present

## 2020-05-15 DIAGNOSIS — J9601 Acute respiratory failure with hypoxia: Secondary | ICD-10-CM | POA: Diagnosis not present

## 2020-05-15 DIAGNOSIS — K219 Gastro-esophageal reflux disease without esophagitis: Secondary | ICD-10-CM | POA: Diagnosis not present

## 2020-05-15 DIAGNOSIS — G9341 Metabolic encephalopathy: Secondary | ICD-10-CM | POA: Diagnosis not present

## 2020-05-15 DIAGNOSIS — G43909 Migraine, unspecified, not intractable, without status migrainosus: Secondary | ICD-10-CM | POA: Diagnosis not present

## 2020-05-15 DIAGNOSIS — Z7951 Long term (current) use of inhaled steroids: Secondary | ICD-10-CM | POA: Diagnosis not present

## 2020-05-15 DIAGNOSIS — Z7952 Long term (current) use of systemic steroids: Secondary | ICD-10-CM | POA: Diagnosis not present

## 2020-05-15 DIAGNOSIS — U071 COVID-19: Secondary | ICD-10-CM | POA: Diagnosis not present

## 2020-05-15 DIAGNOSIS — K5900 Constipation, unspecified: Secondary | ICD-10-CM | POA: Diagnosis not present

## 2020-05-15 DIAGNOSIS — H6991 Unspecified Eustachian tube disorder, right ear: Secondary | ICD-10-CM | POA: Diagnosis not present

## 2020-05-15 DIAGNOSIS — I671 Cerebral aneurysm, nonruptured: Secondary | ICD-10-CM | POA: Diagnosis not present

## 2020-05-15 DIAGNOSIS — E785 Hyperlipidemia, unspecified: Secondary | ICD-10-CM | POA: Diagnosis not present

## 2020-05-15 DIAGNOSIS — H43393 Other vitreous opacities, bilateral: Secondary | ICD-10-CM | POA: Diagnosis not present

## 2020-05-15 DIAGNOSIS — K222 Esophageal obstruction: Secondary | ICD-10-CM | POA: Diagnosis not present

## 2020-05-15 DIAGNOSIS — R7309 Other abnormal glucose: Secondary | ICD-10-CM | POA: Diagnosis not present

## 2020-05-15 DIAGNOSIS — I2699 Other pulmonary embolism without acute cor pulmonale: Secondary | ICD-10-CM | POA: Diagnosis not present

## 2020-05-15 DIAGNOSIS — J302 Other seasonal allergic rhinitis: Secondary | ICD-10-CM | POA: Diagnosis not present

## 2020-05-15 MED ORDER — IRBESARTAN 300 MG PO TABS: 300.0000 mg | ORAL_TABLET | Freq: Every day | ORAL | 11 refills | Status: DC

## 2020-05-15 NOTE — Patient Instructions (Addendum)
It was nice to see you today!  Your goal blood pressure is less than 130/80 mmHg. In clinic, your blood pressure was 158/78 mmHg.  Medication Changes: STOP taking losartan   START taking irbesartan 300 mg daily  Monitor blood pressure at home daily and keep a log (on your phone or piece of paper) to bring with you to your next visit. Write down date, time, blood pressure and pulse.  Keep up the good work with diet and exercise. Aim for a diet full of vegetables, fruit and lean meats (chicken, [place], fish). Try to limit salt intake by eating fresh or frozen vegetables (instead of canned), rinse canned vegetables prior to cooking and do not add any additional salt to meals.   Follow up office visit in 3 weeks on April 20th at 9:30am  Please give [place] a call at [phone] with any questions or concerns.

## 2020-05-16 LAB — BASIC METABOLIC PANEL
BUN/Creatinine Ratio: 9 — ABNORMAL LOW (ref 12–28)
BUN: 7 mg/dL — ABNORMAL LOW (ref 8–27)
CO2: 25 mmol/L (ref 20–29)
Calcium: 9.7 mg/dL (ref 8.7–10.3)
Chloride: 102 mmol/L (ref 96–106)
Creatinine, Ser: 0.78 mg/dL (ref 0.57–1.00)
Glucose: 99 mg/dL (ref 65–99)
Potassium: 3.6 mmol/L (ref 3.5–5.2)
Sodium: 143 mmol/L (ref 134–144)
eGFR: 80 mL/min/{1.73_m2} (ref >59)

## 2020-05-18 ENCOUNTER — Telehealth: Payer: Self-pay | Admitting: Family Medicine

## 2020-05-18 DIAGNOSIS — H43393 Other vitreous opacities, bilateral: Secondary | ICD-10-CM | POA: Diagnosis not present

## 2020-05-18 DIAGNOSIS — Z7901 Long term (current) use of anticoagulants: Secondary | ICD-10-CM | POA: Diagnosis not present

## 2020-05-18 DIAGNOSIS — K219 Gastro-esophageal reflux disease without esophagitis: Secondary | ICD-10-CM | POA: Diagnosis not present

## 2020-05-18 DIAGNOSIS — G9341 Metabolic encephalopathy: Secondary | ICD-10-CM | POA: Diagnosis not present

## 2020-05-18 DIAGNOSIS — G43909 Migraine, unspecified, not intractable, without status migrainosus: Secondary | ICD-10-CM | POA: Diagnosis not present

## 2020-05-18 DIAGNOSIS — E876 Hypokalemia: Secondary | ICD-10-CM | POA: Diagnosis not present

## 2020-05-18 DIAGNOSIS — J1282 Pneumonia due to coronavirus disease 2019: Secondary | ICD-10-CM | POA: Diagnosis not present

## 2020-05-18 DIAGNOSIS — D649 Anemia, unspecified: Secondary | ICD-10-CM | POA: Diagnosis not present

## 2020-05-18 DIAGNOSIS — Z7952 Long term (current) use of systemic steroids: Secondary | ICD-10-CM | POA: Diagnosis not present

## 2020-05-18 DIAGNOSIS — M1712 Unilateral primary osteoarthritis, left knee: Secondary | ICD-10-CM | POA: Diagnosis not present

## 2020-05-18 DIAGNOSIS — I2699 Other pulmonary embolism without acute cor pulmonale: Secondary | ICD-10-CM | POA: Diagnosis not present

## 2020-05-18 DIAGNOSIS — I671 Cerebral aneurysm, nonruptured: Secondary | ICD-10-CM | POA: Diagnosis not present

## 2020-05-18 DIAGNOSIS — K222 Esophageal obstruction: Secondary | ICD-10-CM | POA: Diagnosis not present

## 2020-05-18 DIAGNOSIS — J9601 Acute respiratory failure with hypoxia: Secondary | ICD-10-CM | POA: Diagnosis not present

## 2020-05-18 DIAGNOSIS — E785 Hyperlipidemia, unspecified: Secondary | ICD-10-CM | POA: Diagnosis not present

## 2020-05-18 DIAGNOSIS — H6991 Unspecified Eustachian tube disorder, right ear: Secondary | ICD-10-CM | POA: Diagnosis not present

## 2020-05-18 DIAGNOSIS — R7309 Other abnormal glucose: Secondary | ICD-10-CM | POA: Diagnosis not present

## 2020-05-18 DIAGNOSIS — I2510 Atherosclerotic heart disease of native coronary artery without angina pectoris: Secondary | ICD-10-CM | POA: Diagnosis not present

## 2020-05-18 DIAGNOSIS — U071 COVID-19: Secondary | ICD-10-CM | POA: Diagnosis not present

## 2020-05-18 DIAGNOSIS — K5900 Constipation, unspecified: Secondary | ICD-10-CM | POA: Diagnosis not present

## 2020-05-18 DIAGNOSIS — Z8616 Personal history of COVID-19: Secondary | ICD-10-CM | POA: Diagnosis not present

## 2020-05-18 DIAGNOSIS — J302 Other seasonal allergic rhinitis: Secondary | ICD-10-CM | POA: Diagnosis not present

## 2020-05-18 DIAGNOSIS — Z7951 Long term (current) use of inhaled steroids: Secondary | ICD-10-CM | POA: Diagnosis not present

## 2020-05-18 DIAGNOSIS — K5790 Diverticulosis of intestine, part unspecified, without perforation or abscess without bleeding: Secondary | ICD-10-CM | POA: Diagnosis not present

## 2020-05-18 DIAGNOSIS — M7661 Achilles tendinitis, right leg: Secondary | ICD-10-CM | POA: Diagnosis not present

## 2020-05-18 DIAGNOSIS — I10 Essential (primary) hypertension: Secondary | ICD-10-CM | POA: Diagnosis not present

## 2020-05-18 LAB — BASIC METABOLIC PANEL
BUN: 9 (ref 4–21)
CO2: 30 — AB (ref 13–22)
Chloride: 103 (ref 99–108)
Creatinine: 0.8 (ref 0.5–1.1)
Glucose: 97
Potassium: 3.7 (ref 3.4–5.3)

## 2020-05-18 LAB — COMPREHENSIVE METABOLIC PANEL
Calcium: 9.4 (ref 8.7–10.7)
GFR calc Af Amer: 82

## 2020-05-18 NOTE — Telephone Encounter (Signed)
Patient states she need a back to work starting 0627/22. Patient states Dr.Copland has the form. She would like a call back

## 2020-05-18 NOTE — Telephone Encounter (Signed)
Could you please clarify what this message means? I need more details and im not understanding.

## 2020-05-19 NOTE — Telephone Encounter (Signed)
Patient states it's urgent that Dr Lorelei Pont returns her call in reference to her FMLA paperwork, Patient states she needs to return paperwork to employer asap.    Please advise

## 2020-05-19 NOTE — Telephone Encounter (Signed)
Updated the form, emailed and faxed to employer and patient.

## 2020-05-19 NOTE — Progress Notes (Signed)
[facility] [address] [place], [place] [address] [phone]  Date:  05/29/2020   Name:  Stephanie Parks   DOB:  February 15, 1946   MRN:  [id number]  PCP:  [name], MD    Chief Complaint: Follow-up (4-6 week follow, covid, pneumonia, PE/) and Hip Pain (Left hip pain)   History of Present Illness:  Stephanie Parks is a 75 y.o. very pleasant female patient who presents with the following:  Short term follow-up today Last seen by myself on 3/24: Here today for follow-up visit.  Aleshka unfortunately had LXBWI-20 pneumonia complicated by pulmonary embolism.  She is taking Eliquis and seems to be improving.  She is only using oxygen at night She did have an episode of possible blood in her stool yesterday.  Hemoccult today is negative.  We will check her CBC, I have asked her let me know if any further episodes.  She did have a colonoscopy in 2020 We discussed repeating her chest x-ray today, she would like to wait until next visit which I think is fine Her pulmonologist has referred her to pulmonary rehab, but this may be too expensive for her to undertake  She does note some pain in her left hip today for 2 weeks. NKI.  It bothers her especially with walking, or at night Her breathing is making progress but she still will get SOB with trying to "move fast" She is still using O2 at night - she is not sure when she will be able to stop this  She is going to pulmonary rehab, she was able to get a scholarship program to cover the OOP cost which is great She just started doing this but feels like it is helping her She will complete her 3 months of Eliquis within the next 2 to 3 weeks  She does have maxalt to use as needed for migraine HA.  This was rx per ENT She notes a sensation of HA on the right side of her head, and will sometimes feel a "pressure" in her head.  She may feel lightheaded "all day every day" We  did a CT of her head last month which was normal   Also, at recent ENT visit she was noted to have a pulsatile mass in the right side of her neck.  This is apparent on exam today They did do a CT on March 18 as below, noncontributory CT NECK WITH CONTRAST  TECHNIQUE:  Multidetector CT imaging of the neck was performed using the  standard protocol following the bolus administration of intravenous  contrast.   CONTRAST: 75 mL Omnipaque 300 IV   COMPARISON: CT angio head and neck 10/31/2019   FINDINGS:  Pharynx and larynx: Normal. No mass or swelling.  Salivary glands: No inflammation, mass, or stone.  Thyroid: Negative  Lymph nodes: No enlarged lymph nodes in the neck. Cluster of small  subcentimeter calcifications lateral to the right submandibular  gland likely calcified lymph nodes with a benign appearance.  Vascular: Normal arterial and venous enhancement. No venous  thrombosis.  Limited intracranial: Negative  Visualized orbits: Negative  Mastoids and visualized paranasal sinuses: Negative  Skeleton: ACDF with solid fusion C4 through C6. Disc degeneration  and significant spurring at C3-4 and C6-7. No acute skeletal  abnormality.  Upper chest: Apical scarring bilaterally without acute abnormality.  Other: None   IMPRESSION:  No mass or adenopathy in the neck.  No cause for palpable  abnormality.  Cluster small calcifications lateral to the right submandibular  gland likely chronic calcified lymph node.     Patient Active Problem List   Diagnosis Date Noted  . Pneumonia due to COVID-19 virus 03/12/2020  . Seasonal allergic conjunctivitis 10/04/2019  . Dysfunction of right eustachian tube 10/04/2019  . Pre-diabetes 06/02/2019  . Seasonal and perennial allergic rhinitis 12/22/2018  . Heart palpitations 12/22/2018  . History of chest pain 07/20/2018  . Brain aneurysm 07/20/2018  . Bronchitis 04/23/2018  . Tendonitis, Achilles, right 12/08/2017  . Onychomycosis  12/08/2017  . Unilateral primary osteoarthritis, left knee 10/07/2017  . Ingrown toenail 08/10/2017  . Coughing 04/24/2017  . CAD (coronary artery disease) 03/01/2015  . Aneurysm ([place]) 03/01/2015  . Dizziness 03/01/2015  . Paresthesia of arm 03/01/2015  . Allergic rhinitis 03/01/2015  . Achalasia 09/28/2012  . CN (constipation) 09/28/2012  . Cephalalgia 08/24/2012  . HTN (hypertension) 04/19/2011  . Hyperlipidemia 04/19/2011  . GERD (gastroesophageal reflux disease) 04/19/2011  . Chest pain 04/04/2011    Past Medical History:  Diagnosis Date  . 3-vessel CAD 03/01/2015  . Achalasia 09/28/2012  . Allergic rhinitis 03/01/2015  . Anemia   . Aneurysm ([place]) 03/01/2015  . BP (high blood pressure) 03/01/2015  . Bronchitis 04/23/2018  . Cephalalgia 08/24/2012   Overview:  ICD-10 cut over    . Chest pain 04/04/2011  . CN (constipation) 09/28/2012  . Coughing 04/24/2017  . Decreased potassium in the blood 03/01/2015  . Diverticulosis   . Dizziness 03/01/2015  . GERD (gastroesophageal reflux disease)   . Hiatal hernia   . Hypercholesterolemia 03/01/2015  . Hyperlipidemia   . Hypertension   . Ingrown toenail 08/10/2017  . Inguinal hernia   . Migraine headache   . Onychomycosis 12/08/2017  . Paresthesia of arm 03/01/2015  . Schatzki's ring   . Tendonitis, Achilles, right 12/08/2017  . Unilateral primary osteoarthritis, left knee 10/07/2017    Past Surgical History:  Procedure Laterality Date  . ABDOMINAL HYSTERECTOMY    . BACK SURGERY     x 3  . CARPAL TUNNEL RELEASE     left wrist  . CERVICAL SPINE SURGERY    . HEMORRHOID SURGERY      Social History   Tobacco Use  . Smoking status: Former Smoker    Packs/day: 0.50    Years: 20.00    Pack years: 10.00    Types: Cigarettes    Quit date: 02/19/1984    Years since quitting: 36.2  . Smokeless tobacco: Never Used  Vaping Use  . Vaping Use: Never used  Substance Use Topics  . Alcohol use: No  . Drug use: No    Family History   Problem Relation Age of Onset  . Allergic rhinitis Sister   . Diabetes Sister   . Hypertension Sister   . Heart attack Father 44  . Heart disease Father   . Stomach cancer Maternal Grandmother   . Heart disease Mother   . Colon cancer Neg Hx   . Angioedema Neg Hx   . Asthma Neg Hx   . Eczema Neg Hx   . Urticaria Neg Hx   . Immunodeficiency Neg Hx   . Breast cancer Neg Hx   . Migraines Neg Hx   . Headache Neg Hx     Allergies  Allergen Reactions  . Shellfish Allergy Hives and Swelling  . Ace Inhibitors Other (See Comments)    Pt cannot recall her reaction but  tolerates arb   . Gabapentin Other (See Comments)    headaches  . Pitavastatin     Unknown reaction   . Topiramate     Heart race,   . Sulfa Antibiotics Other (See Comments) and Rash    Fine bumps    Medication list has been reviewed and updated.  Current Outpatient Medications on File Prior to Visit  Medication Sig Dispense Refill  . albuterol (PROAIR HFA) 108 (90 Base) MCG/ACT inhaler Inhale 2 puffs into the lungs every 4 (four) hours as needed for wheezing or shortness of breath. 1 each 1  . apixaban (ELIQUIS) 5 MG TABS tablet Take 1 tablet (5 mg total) by mouth 2 (two) times daily. 60 tablet 1  . ascorbic acid (VITAMIN C) 500 MG tablet Take 500 mg by mouth daily.    . Calcium Carb-Cholecalciferol (CALCIUM 600 + D PO) Take 1 tablet by mouth daily.    Marland Kitchen ELDERBERRY PO Take 50 mg by mouth daily.    Marland Kitchen EPINEPHrine (EPIPEN 2-PAK) 0.3 mg/0.3 mL IJ SOAJ injection Inject 0.3 mLs (0.3 mg total) into the muscle as needed for anaphylaxis. Per allergen immunotherapy protocol 1 each 2  . esomeprazole (NEXIUM) 40 MG capsule Take 1 capsule (40 mg total) by mouth daily. 30 capsule 3  . famotidine (PEPCID) 20 MG tablet Take 1 tablet (20 mg total) by mouth 2 (two) times daily. 60 tablet 2  . fluticasone (FLONASE) 50 MCG/ACT nasal spray USE 2 SPRAYS IN NOSTRIL(S) DAILY AS NEEDED.  In the right nostril point the applicator out  toward your right ear.  In the left nostril point the applicator out toward her left ear (Patient taking differently: Place 2 sprays into both nostrils daily as needed for allergies.) 16 g 9  . irbesartan (AVAPRO) 300 MG tablet Take 1 tablet (300 mg total) by mouth daily. 30 tablet 11  . metoprolol succinate (TOPROL-XL) 25 MG 24 hr tablet Take 0.5 tablets (12.5 mg total) by mouth daily. 90 tablet 1  . Multiple Vitamins-Minerals (MULTIVITAL) tablet Take 1 tablet by mouth daily.     Marland Kitchen omeprazole (PRILOSEC) 40 MG capsule Take 40 mg by mouth daily.    . potassium chloride SA (KLOR-CON M20) 20 MEQ tablet Alternate days-take 2 tablets one day and then 1 tablet the next (Patient taking differently: Take 20 mEq by mouth 2 (two) times daily.) 60 tablet 1  . rizatriptan (MAXALT-MLT) 10 MG disintegrating tablet Take 10 mg by mouth as needed for migraine. May repeat in 2 hours if needed    . rosuvastatin (CRESTOR) 10 MG tablet TAKE 1 TABLET BY MOUTH EVERY DAY (Patient taking differently: Take 10 mg by mouth daily.) 90 tablet 3  . zinc gluconate 50 MG tablet Take 50 mg by mouth daily.     No current facility-administered medications on file prior to visit.    Review of Systems:  As per HPI- otherwise negative.   Physical Examination: Vitals:   05/29/20 0852  BP: 138/82  Pulse: 72  Resp: 17  Temp: 97.8 F (36.6 C)  SpO2: 97%   Vitals:   05/29/20 0852  Weight: 191 lb (86.6 kg)  Height: 5\' 4"  (1.626 m)   Body mass index is 32.79 kg/m. Ideal Body Weight: Weight in (lb) to have BMI = 25: 145.3  GEN: no acute distress.  Mild overweight, looks well  HEENT: Atraumatic, Normocephalic.  Ears and Nose: No external deformity. CV: RRR, No M/G/R. No JVD. No thrill. No extra heart sounds.  PULM: CTA B, no wheezes, crackles, rhonchi. No retractions. No resp. distress. No accessory muscle use. ABD: S, NT, ND, +BS. No rebound. No HSM. EXTR: No c/c/e PSYCH: Normally interactive. Conversant.  Noted  palpable pulsing of her right ?carotid artery just above the right clavicle today, I have not noticed this previously It is nontender She is tender over the left lateral hip, I suspect this is due to trochanteric bursitis.  Normal range of motion of the hip Assessment and Plan: Hypertension, unspecified type  Pneumonia due to COVID-19 virus - Plan: DG Chest 2 View  Hypokalemia - Plan: Basic metabolic panel  Pulsatile neck mass - Plan: US Carotid Duplex Bilateral  Left hip pain - Plan: DG HIP UNILAT W OR W/O PELVIS 2-3 VIEWS LEFT  Chronic right-sided headaches - Plan: Ambulatory referral to Neurology  Evynn is here today for follow-up visit.  She had COVID-19 pneumonia with associated pulmonary embolism earlier this year.  She continues to improve, she is seen pulmonology and also doing pulmonary rehab.  She is now using oxygen only at night.  We will update chest x-ray today for follow-up She is not sure when she can stop using oxygen, I asked her to please query the pulmonologist Suspect hip pain is due to trochanteric bursitis.  Will obtain a film of her hip.  I advised patient that she may benefit from a steroid injection, however I prefer to wait until she finishes her Eliquis in the next few weeks Blood pressures under good control We will check potassium today Noted pulsatile right-sided neck mass.  She has had a CT which was noncontributory.  Will obtain ultrasound of her carotids ASAP for further evaluation She also has complaints of feeling lightheaded like there is pressure in her head/headaches.  This has been going on for a few months.  Will refer to neurology for further evaluation She had a recent CT of her head also on March 3  Signed Lamar Blinks, MD   Received her radiology reports as below, message to patient DG Chest 2 View  Result Date: 05/29/2020 CLINICAL DATA:  75 year old female status post COVID-19 in January. Left hip pain with no known injury. EXAM:  CHEST - 2 VIEW COMPARISON:  Chest radiographs 04/24/2020 and earlier. FINDINGS: Regressed coarse bilateral pulmonary interstitial opacity since February, residual in the upper lungs mildly improved from last month. No pneumothorax, pulmonary edema, pleural effusion, consolidation or area of worsening ventilation. Stable cardiac size and mediastinal contours. No cardiomegaly. Visualized tracheal air column is within normal limits. Prior cervical ACDF. No acute osseous abnormality identified. Negative visible bowel gas pattern. IMPRESSION: 1. Sequelae of COVID-19 pneumonia with mild residual upper lung interstitial changes. 2. No new cardiopulmonary abnormality. Electronically Signed   By: Genevie Ann M.D.   On: 05/29/2020 10:04   DG HIP UNILAT W OR W/O PELVIS 2-3 VIEWS LEFT  Result Date: 05/29/2020 CLINICAL DATA:  75 year old female status post COVID-19 in January. Left hip pain with no known injury. EXAM: DG HIP (WITH OR WITHOUT PELVIS) 2-3V LEFT COMPARISON:  CT Abdomen and Pelvis 11/24/2019. FINDINGS: Femoral heads remain normally located. Pelvis appears stable and intact. Chronic pubic symphysis degeneration. Asymmetric SI joint degeneration greater on the right again noted. Right hip joint space and proximal right femur appears stable and normal for age. Asymmetric superior left hip joint space loss and subchondral sclerosis appears progressed since October. Left acetabular and femoral head spurring. Proximal left femur intact. No acute osseous abnormality identified. Negative visible lower  abdominal and pelvic visceral contours. IMPRESSION: 1. No acute osseous abnormality identified. Asymmetric left hip osteoarthritis appears progressed since October. 2. Chronic pubic symphysis and right greater than left SI joint degeneration. Electronically Signed   By: Genevie Ann M.D.   On: 05/29/2020 10:07

## 2020-05-19 NOTE — Telephone Encounter (Signed)
Patient states she needs the back to work date change on the "back to work certificate form" it should be 08/14/20

## 2020-05-19 NOTE — Telephone Encounter (Signed)
Than you!  Do you need me to do anything?

## 2020-05-19 NOTE — Telephone Encounter (Signed)
Unfortunately, the patient did not want to give me any further information. Looking back at previous messages a form for Stephanie Parks disability was filled out on 04/12/20 so, I'm assuming she wants to go back to work with return date 08/14/20. I'm sorry but she insists Dr. Lorelei Pont knew what she was referring to.

## 2020-05-19 NOTE — Telephone Encounter (Signed)
Patient needed the forms updated to return to work 08/14/2020. She was able to be scheduled at [facility] for the cardiopulmonary rehab. Updated forms and faxed back. She states you were aware of the situation.

## 2020-05-20 DIAGNOSIS — U071 COVID-19: Secondary | ICD-10-CM | POA: Diagnosis not present

## 2020-05-24 ENCOUNTER — Ambulatory Visit: Payer: Medicare Other | Admitting: Family

## 2020-05-24 ENCOUNTER — Other Ambulatory Visit: Payer: Self-pay

## 2020-05-24 ENCOUNTER — Telehealth: Payer: Self-pay | Admitting: Family Medicine

## 2020-05-24 ENCOUNTER — Encounter: Payer: Self-pay | Admitting: Family Medicine

## 2020-05-24 ENCOUNTER — Ambulatory Visit (INDEPENDENT_AMBULATORY_CARE_PROVIDER_SITE_OTHER): Payer: Medicare Other | Admitting: Family Medicine

## 2020-05-24 VITALS — BP 160/92 | HR 72 | Temp 98.2°F | Ht 64.0 in | Wt 188.1 lb

## 2020-05-24 DIAGNOSIS — R030 Elevated blood-pressure reading, without diagnosis of hypertension: Secondary | ICD-10-CM

## 2020-05-24 DIAGNOSIS — R0789 Other chest pain: Secondary | ICD-10-CM | POA: Diagnosis not present

## 2020-05-24 MED ORDER — LIDOCAINE VISCOUS HCL 2 % MT SOLN
15.0000 mL | Freq: Once | OROMUCOSAL | Status: AC
  Administered 2020-05-24: 15 mL via OROMUCOSAL

## 2020-05-24 MED ORDER — ALUM & MAG HYDROXIDE-SIMETH 200-200-20 MG/5ML PO SUSP
30.0000 mL | Freq: Once | ORAL | Status: AC
  Administered 2020-05-24: 30 mL via ORAL

## 2020-05-24 MED ORDER — OMEPRAZOLE 40 MG PO CPDR: 40.0000 mg | DELAYED_RELEASE_CAPSULE | Freq: Every day | ORAL | 3 refills | Status: DC

## 2020-05-24 MED ORDER — FAMOTIDINE 20 MG PO TABS: 20.0000 mg | ORAL_TABLET | Freq: Two times a day (BID) | ORAL | 2 refills | Status: DC

## 2020-05-24 NOTE — Addendum Note (Signed)
Addended by: Kem Boroughs D on: 05/24/2020 01:46 PM   Modules accepted: Orders

## 2020-05-24 NOTE — Progress Notes (Signed)
Chief Complaint  Patient presents with  . Chest Pain    Stephanie Parks is a 75 y.o. female here for evaluation of chest pain.  Duration of issue: 2 weeks Quality: pressure Palliation: walking Provocation: Spicy foods Severity: 8/10 Radiation: to back (common w her reflux) Duration of chest pain: 5 minutes  No injury or change in activity. Associated symptoms: belching Cardiac history: HLD, CAD; follows w cards Family heart history: heart disease in family Smoker? No  She has a history of reflux and takes Protonix for this, 40 mg twice daily.  She reports compliance.  Past Medical History:  Diagnosis Date  . 3-vessel CAD 03/01/2015  . Achalasia 09/28/2012  . Allergic rhinitis 03/01/2015  . Anemia   . Aneurysm ([place]) 03/01/2015  . BP (high blood pressure) 03/01/2015  . Bronchitis 04/23/2018  . Cephalalgia 08/24/2012   Overview:  ICD-10 cut over    . Chest pain 04/04/2011  . CN (constipation) 09/28/2012  . Coughing 04/24/2017  . Decreased potassium in the blood 03/01/2015  . Diverticulosis   . Dizziness 03/01/2015  . GERD (gastroesophageal reflux disease)   . Hiatal hernia   . Hypercholesterolemia 03/01/2015  . Hyperlipidemia   . Hypertension   . Ingrown toenail 08/10/2017  . Inguinal hernia   . Migraine headache   . Onychomycosis 12/08/2017  . Paresthesia of arm 03/01/2015  . Schatzki's ring   . Tendonitis, Achilles, right 12/08/2017  . Unilateral primary osteoarthritis, left knee 10/07/2017   Family History  Problem Relation Age of Onset  . Allergic rhinitis Sister   . Diabetes Sister   . Hypertension Sister   . Heart attack Father 83  . Heart disease Father   . Stomach cancer Maternal Grandmother   . Heart disease Mother   . Colon cancer Neg Hx   . Angioedema Neg Hx   . Asthma Neg Hx   . Eczema Neg Hx   . Urticaria Neg Hx   . Immunodeficiency Neg Hx   . Breast cancer Neg Hx   . Migraines Neg Hx   . Headache Neg Hx     BP (!) 160/92 (BP Location: Left Arm, Patient  Position: Sitting, Cuff Size: Normal)   Pulse 72   Temp 98.2 F (36.8 C) (Oral)   Ht 5\' 4"  (1.626 m)   Wt 188 lb 2 oz (85.3 kg)   SpO2 94%   BMI 32.29 kg/m  Gen: awake, alert, appears stated age HEENT: PERRLA, MMM Neck: No masses or asymmetry Heart: RRR, no bruits, no LE edema Lungs: CTAB, no accessory muscle use Abd: Soft, + epigastric tenderness to palpation, ND, no masses or organomegaly MSK: chest pain is reproducible to palptation Psych: Age appropriate judgment and insight, nml mood and affect  Other chest pain - Plan: EKG 12-Lead, omeprazole (PRILOSEC) 40 MG capsule, famotidine (PEPCID) 20 MG tablet  Chest wall pain  Elevated blood pressure reading  1.  EKG with wandering baseline, normal sinus rhythm, normal axis, no interval abnormalities or T wave changes, no ST segment elevation or depression.  Likely due to reflux given her history and exam.  Will add Pepcid, replace pantoprazole with omeprazole.  Reflux precautions verbalized written down.  If she does start to get exertional symptoms or shortness of breath, she will seek immediate care or follow-up with her cardiologist. 2.  She may also have musculoskeletal chest wall pain.  Stretches and exercises for the pectorals were given in her paperwork.  Ice, heat, Tylenol. 3.  She will  monitor her blood pressure at home and follow-up with Dr. Lorelei Pont if not improved. The patient voiced understanding and agreement to the plan.  Greater than 30 minutes were spent with the patient in addition to reviewing their chart information on the same day of the visit.   [place], DO 05/24/20 12:05 PM

## 2020-05-24 NOTE — Addendum Note (Signed)
Addended by: Sharon Seller B on: 05/24/2020 01:54 PM   Modules accepted: Orders

## 2020-05-24 NOTE — Progress Notes (Signed)
ekg 

## 2020-05-24 NOTE — Telephone Encounter (Signed)
Pt has appointment with [facility] on 05/24/20

## 2020-05-24 NOTE — Patient Instructions (Signed)
I don't think this is related to the heart or lungs.  If you do start with exertional chest pain or shortness of breath, seek immediate care.   The only lifestyle changes that have data behind them are weight loss for the overweight/obese and elevating the head of the bed. Finding out which foods/positions are triggers is important.  Pepcid (famoditine) is available over the counter and you can take 20 mg twice daily.  OK to take Tylenol 1000 mg (2 extra strength tabs) or 975 mg (3 regular strength tabs) every 6 hours as needed.  Ice/cold pack over area for 10-15 min twice daily.  Heat (pad or rice pillow in microwave) over affected area, 10-15 minutes twice daily.   Let us know if you need anything.   Pectoralis Major Rehab Ask your health care provider which exercises are safe for you. Do exercises exactly as told by your health care provider and adjust them as directed. It is normal to feel mild stretching, pulling, tightness, or discomfort as you do these exercises, but you should stop right away if you feel sudden pain or your pain gets worse.Do not begin these exercises until told by your health care provider. Stretching and range of motion exercises These exercises warm up your muscles and joints and improve the movement and flexibility of your shoulder. These exercises can also help to relieve pain, numbness, and tingling. Exercise A: Pendulum  1. Stand near a wall or a surface that you can hold onto for balance. 2. Bend at the waist and let your left / right arm hang straight down. Use your other arm to keep your balance. 3. Relax your arm and shoulder muscles, and move your hips and your trunk so your left / right arm swings freely. Your arm should swing because of the motion of your body, not because you are using your arm or shoulder muscles. 4. Keep moving so your arm swings in the following directions, as told by your health care provider: ? Side to side. ? Forward and  backward. ? In clockwise and counterclockwise circles. 5. Slowly return to the starting position. Repeat 2 times. Complete this exercise 3 times per week. Exercise B: Abduction, standing 1. Stand and hold a broomstick, a cane, or a similar object. Place your hands a little more than shoulder-width apart on the object. Your left / right hand should be palm-up, and your other hand should be palm-down. 2. While keeping your elbow straight and your shoulder muscles relaxed, push the stick across your body toward your left / right side. Raise your left / right arm to the side of your body and then over your head until you feel a stretch in your shoulder. ? Stop when you reach the angle that is recommended by your health care provider. ? Avoid shrugging your shoulder while you raise your arm. Keep your shoulder blade tucked down toward the middle of your spine. 3. Hold for 10 seconds. 4. Slowly return to the starting position. Repeat 2 times. Complete this exercise 3 times per week. Exercise C: Wand flexion, supine  1. Lie on your back. You may bend your knees for comfort. 2. Hold a broomstick, a cane, or a similar object so that your hands are about shoulder-width apart on the object. Your palms should face toward your feet. 3. Raise your left / right arm in front of your face, then behind your head (toward the floor). Use your other hand to help you do this. Stop  when you feel a gentle stretch in your shoulder, or when you reach the angle that is recommended by your health care provider. 4. Hold for 3 seconds. 5. Use the broomstick and your other arm to help you return your left / right arm to the starting position. Repeat 2 times. Complete this exercise 3 times per week. Exercise D: Wand shoulder external rotation 1. Stand and hold a broomstick, a cane, or a similar object so your handsare about shoulder-width apart on the object. 2. Start with your arms hanging down, then bend both elbows to an  "L" shape (90 degrees). 3. Keep your left / right elbow at your side. Use your other hand to push the stick so your left / right forearm moves away from your body, out to your side. ? Keep your left / right elbow bent to 90 degrees and keep it against your side. ? Stop when you feel a gentle stretch in your shoulder, or when you reach the angle recommended by your health care provider. 4. Hold for 10 seconds. 5. Use the stick to help you return your left / right arm to the starting position. Repeat 2 times. Complete this exercise 3 times per week. Strengthening exercises These exercises build strength and endurance in your shoulder. Endurance is the ability to use your muscles for a long time, even after your muscles get tired. Exercise E: Scapular protraction, standing 1. Stand so you are facing a wall. Place your feet about one arm-length away from the wall. 2. Place your hands on the wall and straighten your elbows. 3. Keep your hands on the wall as you push your upper back away from the wall. You should feel your shoulder blades sliding forward.Keep your elbows and your head still. ? If you are not sure that you are doing this exercise correctly, ask your health care provider for more instructions. 4. Hold for 3 seconds. 5. Slowly return to the starting position. Let your muscles relax completely before you repeat this exercise. Repeat 2 times. Complete this exercise 3 times per week. Exercise F: Shoulder blade squeezes  (scapular retraction) 1. Sit with good posture in a stable chair. Do not let your back touch the back of the chair. 2. Your arms should be at your sides with your elbows bent. You may rest your forearms on a pillow if that is more comfortable. 3. Squeeze your shoulder blades together. Bring them down and back. ? Keep your shoulders level. ? Do not lift your shoulders up toward your ears. 4. Hold for 3 seconds. 5. Return to the starting position. Repeat 2 times.  Complete this exercise 3 times per week. This information is not intended to replace advice given to you by your health care provider. Make sure you discuss any questions you have with your health care provider. Document Released: 02/04/2005 Document Revised: 11/16/2015 Document Reviewed: 10/23/2014 Elsevier Interactive Patient Education  [employer].

## 2020-05-24 NOTE — Telephone Encounter (Signed)
Patient called requesting an earlier appointment with Dr Lorelei Pont, patient made aware that Dr Lorelei Pont did not have anything available. Patient offered another provider until " she stated chest pain on and off for a few days" patient transferred to triage nurse.

## 2020-05-24 NOTE — Telephone Encounter (Signed)
Nurse Assessment Nurse: Ysidro Evert, RN, Levada Dy Date/Time (Eastern Time): 05/24/2020 8:45:02 AM Confirm and document reason for call. If symptomatic, describe symptoms. ---Caller states she is having upper abdominal pain from acid reflux that has been ongoing. She is taking medication for it and it doesn't seem to be helping anymore. No other symptoms Does the patient have any new or worsening symptoms? ---Yes Will a triage be completed? ---Yes Related visit to physician within the last 2 weeks? ---No Does the PT have any chronic conditions? (i.e. diabetes, asthma, this includes High risk factors for pregnancy, etc.) ---No Is this a behavioral health or substance abuse call? ---No Guidelines Guideline Title Affirmed Question Affirmed Notes Nurse Date/Time Eilene Ghazi Time) Abdominal Pain - Upper [1] MILDMODERATE pain AND [2] not relieved by antacids Ysidro Evert, RN, Levada Dy 05/24/2020 8:46:44 AM Disp. Time Eilene Ghazi Time) Disposition Final User 05/24/2020 8:43:39 AM Send to Urgent Queue Josephine Cables 05/24/2020 8:51:13 AM See HCP within 4 Hours (or PCP triage) Yes Ysidro Evert, RN, Levada Dy PLEASE NOTE: All timestamps contained within this report are represented as [place] Standard Time. CONFIDENTIALTY NOTICE: This fax transmission is intended only for the addressee. It contains information that is legally privileged, confidential or otherwise protected from use or disclosure. If you are not the intended recipient, you are strictly prohibited from reviewing, disclosing, copying using or disseminating any of this information or taking any action in reliance on or regarding this information. If you have received this fax in error, please notify us immediately by telephone so that we can arrange for its return to [place]. Phone: [phone], Toll-Free: [phone], Fax: [phone] Page: 2 of 2 Call Id: [id number] [place] Disagree/Comply Comply Caller Understands Yes PreDisposition Did not know what to  do Care Advice Given Per Guideline SEE HCP (OR PCP TRIAGE) WITHIN 4 HOURS: CALL BACK IF: * You become worse CARE ADVICE given per Abdominal Pain, Upper (Adult) guideline. Referrals REFERRED TO PCP OFFICE

## 2020-05-25 DIAGNOSIS — U099 Post covid-19 condition, unspecified: Secondary | ICD-10-CM | POA: Diagnosis not present

## 2020-05-25 DIAGNOSIS — J9601 Acute respiratory failure with hypoxia: Secondary | ICD-10-CM | POA: Diagnosis not present

## 2020-05-26 ENCOUNTER — Telehealth: Payer: Self-pay | Admitting: Family Medicine

## 2020-05-26 MED ORDER — ESOMEPRAZOLE MAGNESIUM 40 MG PO CPDR: 40.0000 mg | DELAYED_RELEASE_CAPSULE | Freq: Every day | ORAL | 3 refills | Status: DC

## 2020-05-26 NOTE — Telephone Encounter (Signed)
Caller Stephanie Parks  Call Back @ [phone]  Patient seen Dr. Nani Ravens on 05/24/2020,  Patient prescribed medication for chest pain, heart burn and bp   : famotidine (PEPCID) 20 MG tablet [[id number]    omeprazole (PRILOSEC) 40 MG capsule [[id number]]   Patient states medication is making her dizziness, patient would like another medication prescribed if possible or recommend a medication over the counter.

## 2020-05-26 NOTE — Telephone Encounter (Signed)
The patient informed of medication sent.

## 2020-05-26 NOTE — Telephone Encounter (Signed)
Sent in an alternative. Ty.

## 2020-05-29 ENCOUNTER — Ambulatory Visit (INDEPENDENT_AMBULATORY_CARE_PROVIDER_SITE_OTHER): Payer: Medicare Other | Admitting: Family Medicine

## 2020-05-29 ENCOUNTER — Ambulatory Visit (HOSPITAL_BASED_OUTPATIENT_CLINIC_OR_DEPARTMENT_OTHER)
Admission: RE | Admit: 2020-05-29 | Discharge: 2020-05-29 | Disposition: A | Discharge status: Home / Self Care | Payer: Medicare Other | Source: Ambulatory Visit | Attending: Family Medicine | Admitting: Family Medicine

## 2020-05-29 ENCOUNTER — Encounter: Payer: Self-pay | Admitting: Family Medicine

## 2020-05-29 ENCOUNTER — Other Ambulatory Visit: Payer: Self-pay

## 2020-05-29 VITALS — BP 138/82 | HR 72 | Temp 97.8°F | Resp 17 | Ht 64.0 in | Wt 191.0 lb

## 2020-05-29 DIAGNOSIS — R221 Localized swelling, mass and lump, neck: Secondary | ICD-10-CM

## 2020-05-29 DIAGNOSIS — U071 COVID-19: Secondary | ICD-10-CM

## 2020-05-29 DIAGNOSIS — U099 Post covid-19 condition, unspecified: Secondary | ICD-10-CM | POA: Diagnosis not present

## 2020-05-29 DIAGNOSIS — E876 Hypokalemia: Secondary | ICD-10-CM | POA: Diagnosis not present

## 2020-05-29 DIAGNOSIS — J9601 Acute respiratory failure with hypoxia: Secondary | ICD-10-CM | POA: Diagnosis not present

## 2020-05-29 DIAGNOSIS — R519 Headache, unspecified: Secondary | ICD-10-CM | POA: Diagnosis not present

## 2020-05-29 DIAGNOSIS — J1282 Pneumonia due to coronavirus disease 2019: Secondary | ICD-10-CM | POA: Diagnosis not present

## 2020-05-29 DIAGNOSIS — G8929 Other chronic pain: Secondary | ICD-10-CM | POA: Diagnosis not present

## 2020-05-29 DIAGNOSIS — M25552 Pain in left hip: Secondary | ICD-10-CM | POA: Diagnosis not present

## 2020-05-29 DIAGNOSIS — I10 Essential (primary) hypertension: Secondary | ICD-10-CM

## 2020-05-29 LAB — BASIC METABOLIC PANEL
BUN: 8 mg/dL (ref 6–23)
CO2: 32 mEq/L (ref 19–32)
Calcium: 9.5 mg/dL (ref 8.4–10.5)
Chloride: 104 mEq/L (ref 96–112)
Creatinine, Ser: 0.77 mg/dL (ref 0.40–1.20)
GFR: 75.87 mL/min (ref >60.00)
Glucose, Bld: 89 mg/dL (ref 70–99)
Potassium: 3.3 mEq/L — ABNORMAL LOW (ref 3.5–5.1)
Sodium: 143 mEq/L (ref 135–145)

## 2020-05-29 NOTE — Patient Instructions (Addendum)
Good to see you again today! Please go to lab and then to the ground floor for hip, chest x-ray and hopefully ultrasound of your neck I would encourage you to stop checking home BP if it seems to make you anxious  I am not sure when you can stop the nighttime oxygen.  If you like, please call your pulmonary doctor and see if they can help! [address], [place], [place] [address]  [phone] (Work)   We will plan to continue eliquis for 3 moths, then you can stop If you continue to have bursitis pain in your hip at that time we can try a steroid injection.  In the meantime please try some topical Voltaren gel

## 2020-05-31 DIAGNOSIS — J9601 Acute respiratory failure with hypoxia: Secondary | ICD-10-CM | POA: Diagnosis not present

## 2020-05-31 DIAGNOSIS — U099 Post covid-19 condition, unspecified: Secondary | ICD-10-CM | POA: Diagnosis not present

## 2020-06-01 DIAGNOSIS — J9601 Acute respiratory failure with hypoxia: Secondary | ICD-10-CM | POA: Diagnosis not present

## 2020-06-01 DIAGNOSIS — U099 Post covid-19 condition, unspecified: Secondary | ICD-10-CM | POA: Diagnosis not present

## 2020-06-05 ENCOUNTER — Other Ambulatory Visit: Payer: Self-pay | Admitting: Family Medicine

## 2020-06-05 DIAGNOSIS — E876 Hypokalemia: Secondary | ICD-10-CM

## 2020-06-05 DIAGNOSIS — J9601 Acute respiratory failure with hypoxia: Secondary | ICD-10-CM | POA: Diagnosis not present

## 2020-06-05 DIAGNOSIS — U099 Post covid-19 condition, unspecified: Secondary | ICD-10-CM | POA: Diagnosis not present

## 2020-06-06 ENCOUNTER — Ambulatory Visit (INDEPENDENT_AMBULATORY_CARE_PROVIDER_SITE_OTHER): Payer: Medicare Other

## 2020-06-06 ENCOUNTER — Telehealth: Payer: Self-pay | Admitting: Family Medicine

## 2020-06-06 DIAGNOSIS — J1282 Pneumonia due to coronavirus disease 2019: Secondary | ICD-10-CM

## 2020-06-06 DIAGNOSIS — I10 Essential (primary) hypertension: Secondary | ICD-10-CM

## 2020-06-06 DIAGNOSIS — Z9989 Dependence on other enabling machines and devices: Secondary | ICD-10-CM

## 2020-06-06 DIAGNOSIS — G4733 Obstructive sleep apnea (adult) (pediatric): Secondary | ICD-10-CM

## 2020-06-06 NOTE — Patient Instructions (Signed)
Visit Information: Thank you for taking the time to speak with me today.  PATIENT GOALS: Goals Addressed            This Visit's Progress   . Increase activity and improve endurance   On track    Timeframe:  Long-Range Goal Priority:  Medium Start Date:  05/08/20                           Expected End Date:  08/07/20                     Follow up date 07/04/20  Continue to participate with pulmonary rehab as recommended. Perform Activities of daily living independently. Try to check your blood oxygen readings sometimes during the night (if you awaken during the night). Record and take with you to your provider visit. Call your provider, if you have any questions or concerns. Continue to wear oxygen per pulmonary recommendation Continue to take medications as prescribed. Call your provider if you have any questions or concern. Continue to attend all scheduled provider appointments as of today scheduled for : [facility] Pharmacist 06/07/20; [facility] 06/09/20; Neurologist 06/16/20; Primary care Provider 07/12/20 and Pulmonologist 07/26/20 Continue to Eat Healthy meal plan: foods low in salt and heart healthy meals full of fruits, vegetables, whole grains, lean protein and limit fat, and sugars. It may also help to eat smaller portions 5-6 times a day.        . Track and Manage My Blood Pressure-Hypertension   On track    Timeframe:  Long-Range Goal Priority:  Medium Start Date:  05/08/20                           Expected End Date:  08/07/20                     Follow Up Date 06/06/20    - check blood pressure as recommended by provider - write blood pressure results in a log or diary and take to provider appointments. -take your blood pressure cuff to provider office to check accuracy of your blood pressure monitor. -attend provider visits as scheduled -plan to eat low salt and heart healthy meals with fruits, vegetables, whole grains, lean protein and limit fats and  sugars. -increase activity as tolerated and recommended by your providers. Continue to work with your home health physical therapist as scheduled. -Review educational material on DASH diet. -ask provider "what is my Target blood pressure goal?" and "what is my Target blood pressure range?" at next visit.    Why is this important?    You won't feel high blood pressure, but it can still hurt your blood vessels.   High blood pressure can cause heart or kidney problems. It can also cause a stroke.   Making lifestyle changes like losing a little weight or eating less salt will help.   Checking your blood pressure at home and at different times of the day can help to control blood pressure.   If the doctor prescribes medicine remember to take it the way the doctor ordered.   Call the office if you cannot afford the medicine or if there are questions about it.           Patient verbalizes understanding of instructions provided today and agrees to view in [place].   Telephone follow up appointment with  care management team member scheduled for: Jul 04, 2020 The patient has been provided with contact information for the care management team and has been advised to call with any health related questions or concerns.    Thea Silversmith, RN, MSN, BSN, CCM Care Management Coordinator [facility] [phone]

## 2020-06-06 NOTE — Telephone Encounter (Signed)
Spoke with patient, I have once again faxed forms to [place] and received confirmation. She states she is on the way to pick up original copy in case.

## 2020-06-06 NOTE — Chronic Care Management (AMB) (Signed)
Chronic Care Management   CCM RN Visit Note  06/06/2020 Name: Stephanie Parks MRN: [id number] DOB: [birth year]  Subjective: Stephanie Parks is a 75 y.o. year old female who is a primary care patient of Copland, Gay Filler, MD. The care management team was consulted for assistance with disease management and care coordination needs.    Engaged with patient by telephone for follow up visit in response to provider referral for case management and/or care coordination services.   Consent to Services:  The patient was given information about Chronic Care Management services, agreed to services, and gave verbal consent prior to initiation of services.  Please see initial visit note for detailed documentation.   Patient agreed to services and verbal consent obtained.   Assessment: Review of patient past medical history, allergies, medications, health status, including review of consultants reports, laboratory and other test data, was performed as part of comprehensive evaluation and provision of chronic care management services.   SDOH (Social Determinants of Health) assessments and interventions performed:    CCM Care Plan  Allergies  Allergen Reactions  . Shellfish Allergy Hives and Swelling  . Ace Inhibitors Other (See Comments)    Pt cannot recall her reaction but tolerates arb   . Gabapentin Other (See Comments)    headaches  . Pitavastatin     Unknown reaction   . Topiramate     Heart race,   . Sulfa Antibiotics Other (See Comments) and Rash    Fine bumps    Outpatient Encounter Medications as of 06/06/2020  Medication Sig Note  . albuterol (PROAIR HFA) 108 (90 Base) MCG/ACT inhaler Inhale 2 puffs into the lungs every 4 (four) hours as needed for wheezing or shortness of breath.   Marland Kitchen apixaban (ELIQUIS) 5 MG TABS tablet Take 1 tablet (5 mg total) by mouth 2 (two) times daily.   Marland Kitchen ascorbic acid (VITAMIN C) 500 MG tablet Take 500 mg by mouth daily.   . Calcium Carb-Cholecalciferol  (CALCIUM 600 + D PO) Take 1 tablet by mouth daily.   Marland Kitchen ELDERBERRY PO Take 50 mg by mouth daily.   . fluticasone (FLONASE) 50 MCG/ACT nasal spray USE 2 SPRAYS IN NOSTRIL(S) DAILY AS NEEDED.  In the right nostril point the applicator out toward your right ear.  In the left nostril point the applicator out toward her left ear (Patient taking differently: Place 2 sprays into both nostrils daily as needed for allergies.)   . irbesartan (AVAPRO) 300 MG tablet Take 1 tablet (300 mg total) by mouth daily.   . metoprolol succinate (TOPROL-XL) 25 MG 24 hr tablet Take 0.5 tablets (12.5 mg total) by mouth daily.   . Multiple Vitamins-Minerals (MULTIVITAL) tablet Take 1 tablet by mouth daily.    Marland Kitchen omeprazole (PRILOSEC) 40 MG capsule Take 40 mg by mouth daily.   . potassium chloride SA (KLOR-CON M20) 20 MEQ tablet TAKE 2 TABLETS ONE DAY THEN 1 TABLET THE NEXT 06/06/2020: Reports takes two tablets every day.  . rosuvastatin (CRESTOR) 10 MG tablet TAKE 1 TABLET BY MOUTH EVERY DAY (Patient taking differently: Take 10 mg by mouth daily.)   . zinc gluconate 50 MG tablet Take 50 mg by mouth daily.   Marland Kitchen EPINEPHrine (EPIPEN 2-PAK) 0.3 mg/0.3 mL IJ SOAJ injection Inject 0.3 mLs (0.3 mg total) into the muscle as needed for anaphylaxis. Per allergen immunotherapy protocol   . esomeprazole (NEXIUM) 40 MG capsule Take 1 capsule (40 mg total) by mouth daily. (Patient not taking: Reported  on 06/06/2020)   . famotidine (PEPCID) 20 MG tablet Take 1 tablet (20 mg total) by mouth 2 (two) times daily. (Patient not taking: Reported on 06/06/2020)   . rizatriptan (MAXALT-MLT) 10 MG disintegrating tablet Take 10 mg by mouth as needed for migraine. May repeat in 2 hours if needed (Patient not taking: Reported on 06/06/2020)    No facility-administered encounter medications on file as of 06/06/2020.    Patient Active Problem List   Diagnosis Date Noted  . Pneumonia due to COVID-19 virus 03/12/2020  . Seasonal allergic conjunctivitis  10/04/2019  . Dysfunction of right eustachian tube 10/04/2019  . Pre-diabetes 06/02/2019  . Seasonal and perennial allergic rhinitis 12/22/2018  . Heart palpitations 12/22/2018  . History of chest pain 07/20/2018  . Brain aneurysm 07/20/2018  . Bronchitis 04/23/2018  . Tendonitis, Achilles, right 12/08/2017  . Onychomycosis 12/08/2017  . Unilateral primary osteoarthritis, left knee 10/07/2017  . Ingrown toenail 08/10/2017  . Coughing 04/24/2017  . CAD (coronary artery disease) 03/01/2015  . Aneurysm ([place]) 03/01/2015  . Dizziness 03/01/2015  . Paresthesia of arm 03/01/2015  . Allergic rhinitis 03/01/2015  . Achalasia 09/28/2012  . CN (constipation) 09/28/2012  . Cephalalgia 08/24/2012  . HTN (hypertension) 04/19/2011  . Hyperlipidemia 04/19/2011  . GERD (gastroesophageal reflux disease) 04/19/2011  . Chest pain 04/04/2011    Conditions to be addressed/monitored: HTN and post covid pneumonia/pulmonary embolus sequellae  Care Plan : Post Covid Pneumonia  Updates made by Luretha Rued, RN since 06/06/2020 12:00 AM    Problem: Decrease in Activity and Endurance 2 months Covid   Priority: Medium    Long-Range Goal: Development of long term plan for managment of ongoing symptoms post covid pneumonia in a patient with OSA, chronic seasonal allergies, deviated nasal septum   Start Date: 05/08/2020  Expected End Date: 08/06/2020  This Visit's Progress: On track  Recent Progress: On track  Priority: Medium  Note:   Current Barriers:  Marland Kitchen Knowledge Deficits related to long term plan for management for ongoing signs/symptoms post Covid pneumonia . Patient hospitalized 03/12/20-03/21/20 with Covid pneumonia. Client is also recovering from pulmonary embolus. She reports improvement. She is attending pulmonary rehab three times/week, and expresses that she enjoys attending. She feels this is helping with endurance/strengthening. She continues to use oxygen, but only at night. She states she  thinks she may be able to stop using at night, but will discuss with pulmonologist at next office visit.   Nurse Case Manager Clinical Goal(s):  . patient will verbalize understanding of plan for improvement of over time, improve ability to use energy conservation and management techniques. . patient will work with pulmonary rehab to address needs related to increase mobility and endurance . patient will attend all scheduled medical appointments . the patient will demonstrate ongoing self health care management ability as evidenced by continuing to attend provider visits, taking medications as recommended, maintaining respiratory and cardiovascular functions during activities.  Interventions:  . 1:1 collaboration with Copland, Gay Filler, MD regarding development and update of comprehensive plan of care as evidenced by provider attestation and co-signature . Inter-disciplinary care team collaboration (see longitudinal plan of care) . Medications reviewed with client . Advised patient to continue self care management strategies: attend pulmonary rehab sessions as scheduled; take medications as prescribed; attend provider visits; call provider for questions or concerns. . Reviewed scheduled/upcoming provider appointments including: [facility] pharmacist; Imaging; Neurologist; Primary care Provider; Pulmonologist . Discussed pulse oximetry and encouraged client to continue to check sometimes  during the night (if she awakens during the night).  . Provided positive feedback regarding self care activities . Discussed plans with patient for ongoing care management follow up and provided patient with direct contact information for care management team  Patient Goals/Self-Care Activities Continue to participate with pulmonary rehab as recommended. Perform Activities of daily living independently. Try to check your blood oxygen readings sometimes during the night (if you awaken during the night). Record and take  with you to your provider visit. Call your provider, if you have any questions or concerns. Continue to wear oxygen per pulmonary recommendation Continue to take medications as prescribed. Call your provider if you have any questions or concern. Continue to attend all scheduled provider appointments as of today scheduled for : [facility] Pharmacist 06/07/20; [facility] 06/09/20; Neurologist 06/16/20; Primary care Provider 07/12/20 and Pulmonologist 07/26/20 Continue to Eat Healthy meal plan: foods low in salt and heart healthy meals full of fruits, vegetables, whole grains, lean protein and limit fat, and sugars. It may also help to eat smaller portions 5-6 times a day. .   Follow Up Plan: The patient has been provided with contact information for the care management team and has been advised to call with any health related questions or concerns.  The care management team will reach out to the patient again over the next 45 days.       Care Plan : Hypertension (Adult)  Updates made by Luretha Rued, RN since 06/06/2020 12:00 AM    Problem: Disease Progression (Hypertension)     Long-Range Goal: Disease Progression Prevented or Minimized   Start Date: 05/08/2020  Expected End Date: 08/06/2020  This Visit's Progress: On track  Recent Progress: On track  Priority: Medium  Note:   Objective:  . Last practice recorded BP readings:  BP Readings from Last 3 Encounters:  05/01/20 (!) 160/78  04/24/20 140/70  04/20/20 (!) 150/72   Current Barriers:  Marland Kitchen Knowledge Deficits related to long term self-management of hypertension. Client reports her blood pressure readings have improved. Last noted BP reading 138/82 on 05/29/20 and she states, that her blood pressure is checked at each pulmonary rehab session.  Client reports she took her blood pressure cuff to provider visit and confirmed accuracy of blood pressure cuff. Client unclear of Target blood pressure range. She continues to see [facility]  Pharmacist.   Case Manager Clinical Goal(s):  . patient will verbalize understanding of plan for hypertension management . patient will attend all scheduled medical appointments . patient will demonstrate improved adherence to prescribed treatment plan for hypertension as evidenced by taking all medications as prescribed, monitoring and recording blood pressure as directed, adhering to low sodium/DASH diet; taking medications as prescribed; knowing target parameters for blood pressure and calling provider with questions/concerns as needed. Interventions:  . Collaboration with Copland, Gay Filler, MD regarding development and update of comprehensive plan of care as evidenced by provider attestation and co-signature . Inter-disciplinary care team collaboration (see longitudinal plan of care) . Encouraged to continue to monitor salt intake . Reviewed medications with patient . Reviewed scheduled/upcoming provider appointments including: [facility] pharmacist: [facility] imagery; Neurologist; Primary care provider; Pulmonologist . Client encouraged to find out Target blood pressure range from provider. . Discussed plans with patient for ongoing care management follow up and provided patient with direct contact information for care management team Self-Care Activities: . Take medications as prescribed . Attends all scheduled provider appointments . Calls provider  office for new concerns, questions . Monitors Blood pressure Patient Goals: . continue to take medications as prescribed . call provider office for new concerns or questions . attend provider visits as scheduled. As of today scheduled for : Pulmonary Rehab therapy program; [facility] Pharmacist 06/07/20; [facility] 06/09/20; Neurologist 06/16/20; Primary care Provider 07/12/20 and Pulmonologist 07/26/20 . continue to eat low salt and heart healthy meals with fruits, vegetables, whole grains, lean protein and limit fats and  sugars. . monitor Blood pressure as recommended by provider. Ask provider "what is my Target blood pressure goal?" and "what is my Target blood pressure range?" at next visit.  Follow Up Plan: The patient has been provided with contact information for the care management team and has been advised to call with any health related questions or concerns.  The care management team will reach out to the patient again over the next 45 days.       Plan:The patient has been provided with contact information for the care management team and has been advised to call with any health related questions or concerns.  and The care management team will reach out to the patient again over the next 45 days.  Thea Silversmith, RN, MSN, BSN, CCM Care Management Coordinator [facility] [phone]

## 2020-06-06 NOTE — Telephone Encounter (Signed)
Caller Stephanie Parks  Call Back @ [phone]  Patient states her employer hasn't received her note to return back to work nor the email.  Please advise and resend information for patient.  Patient is also requesting a call back from [place] on her cell phone @ [phone]

## 2020-06-07 ENCOUNTER — Other Ambulatory Visit: Payer: Self-pay

## 2020-06-07 ENCOUNTER — Ambulatory Visit (INDEPENDENT_AMBULATORY_CARE_PROVIDER_SITE_OTHER): Payer: Medicare Other | Admitting: Pharmacist

## 2020-06-07 ENCOUNTER — Encounter: Payer: Self-pay | Admitting: Pharmacist

## 2020-06-07 VITALS — BP 146/78 | HR 78

## 2020-06-07 DIAGNOSIS — R519 Headache, unspecified: Secondary | ICD-10-CM | POA: Insufficient documentation

## 2020-06-07 DIAGNOSIS — I10 Essential (primary) hypertension: Secondary | ICD-10-CM | POA: Diagnosis not present

## 2020-06-07 MED ORDER — SPIRONOLACTONE 25 MG PO TABS: 12.5000 mg | ORAL_TABLET | Freq: Every day | ORAL | 2 refills | Status: DC

## 2020-06-07 NOTE — Progress Notes (Signed)
Patient ID: CARLEA BADOUR                 DOB: 11/04/1945                      MRN: [id number]     HPI: Stephanie Parks is a 75 y.o. female referred by Dr. Irish Lack to HTN clinic.  PMH is significant for HTN, HLD, h/o brain aneurysm, hypokalemia, chest pain, prediabetes. Cath in 2020 showed normal coronaries. Echo 03/15/20 showed LVEF 55-60%. Of note, patient has allergy listed to ACE inhibitors (pt cannot recall reaction) but tolerates ARBs.  Patient has frequent headaches not relieved by Triptans.  At last visit in HTN clinic patient was switched from losartan to irbesartan and blood pressure has been slowly decreasing.  Does not add salt to food.  Denies alcohol and tobacco.  Home BP readings:  140/63 135/65 143/65 145/78 143/69 145/65 142/72  Patient has history of hypokalemia currently supplemented with [place]-Con 40 mEq daily.  Takes 71mEq in the morning and 20 in the afternoon due to difficulty swallowing tablets.   Patient currently receiving 2L of O2 a night due to lingering effects of PE after Covid 19.     Current HTN meds: metoprolol XL 12.5mg  daily, irbesartan 300mg  Previously tried: losartan 100mg  daily BP goal: <130/80  Social History: denies alcohol  Diet: grits and eggs, beef sausage, chicken, tacos, fish, [place]  Wt Readings from Last 3 Encounters:  05/29/20 191 lb (86.6 kg)  05/24/20 188 lb 2 oz (85.3 kg)  05/11/20 191 lb (86.6 kg)   BP Readings from Last 3 Encounters:  05/29/20 138/82  05/24/20 (!) 160/92  05/15/20 (!) 158/78   Pulse Readings from Last 3 Encounters:  05/29/20 72  05/24/20 72  05/15/20 75    Renal function: Estimated Creatinine Clearance: 65.7 mL/min (by C-G formula based on SCr of 0.77 mg/dL).  Past Medical History:  Diagnosis Date  . 3-vessel CAD 03/01/2015  . Achalasia 09/28/2012  . Allergic rhinitis 03/01/2015  . Anemia   . Aneurysm ([place]) 03/01/2015  . BP (high blood pressure) 03/01/2015  . Bronchitis 04/23/2018  .  Cephalalgia 08/24/2012   Overview:  ICD-10 cut over    . Chest pain 04/04/2011  . CN (constipation) 09/28/2012  . Coughing 04/24/2017  . Decreased potassium in the blood 03/01/2015  . Diverticulosis   . Dizziness 03/01/2015  . GERD (gastroesophageal reflux disease)   . Hiatal hernia   . Hypercholesterolemia 03/01/2015  . Hyperlipidemia   . Hypertension   . Ingrown toenail 08/10/2017  . Inguinal hernia   . Migraine headache   . Onychomycosis 12/08/2017  . Paresthesia of arm 03/01/2015  . Schatzki's ring   . Tendonitis, Achilles, right 12/08/2017  . Unilateral primary osteoarthritis, left knee 10/07/2017    Current Outpatient Medications on File Prior to Visit  Medication Sig Dispense Refill  . albuterol (PROAIR HFA) 108 (90 Base) MCG/ACT inhaler Inhale 2 puffs into the lungs every 4 (four) hours as needed for wheezing or shortness of breath. 1 each 1  . apixaban (ELIQUIS) 5 MG TABS tablet Take 1 tablet (5 mg total) by mouth 2 (two) times daily. 60 tablet 1  . ascorbic acid (VITAMIN C) 500 MG tablet Take 500 mg by mouth daily.    . Calcium Carb-Cholecalciferol (CALCIUM 600 + D PO) Take 1 tablet by mouth daily.    Marland Kitchen ELDERBERRY PO Take 50 mg by mouth daily.    Marland Kitchen EPINEPHrine (  EPIPEN 2-PAK) 0.3 mg/0.3 mL IJ SOAJ injection Inject 0.3 mLs (0.3 mg total) into the muscle as needed for anaphylaxis. Per allergen immunotherapy protocol 1 each 2  . esomeprazole (NEXIUM) 40 MG capsule Take 1 capsule (40 mg total) by mouth daily. (Patient not taking: Reported on 06/06/2020) 30 capsule 3  . famotidine (PEPCID) 20 MG tablet Take 1 tablet (20 mg total) by mouth 2 (two) times daily. (Patient not taking: Reported on 06/06/2020) 60 tablet 2  . fluticasone (FLONASE) 50 MCG/ACT nasal spray USE 2 SPRAYS IN NOSTRIL(S) DAILY AS NEEDED.  In the right nostril point the applicator out toward your right ear.  In the left nostril point the applicator out toward her left ear (Patient taking differently: Place 2 sprays into both  nostrils daily as needed for allergies.) 16 g 9  . irbesartan (AVAPRO) 300 MG tablet Take 1 tablet (300 mg total) by mouth daily. 30 tablet 11  . metoprolol succinate (TOPROL-XL) 25 MG 24 hr tablet Take 0.5 tablets (12.5 mg total) by mouth daily. 90 tablet 1  . Multiple Vitamins-Minerals (MULTIVITAL) tablet Take 1 tablet by mouth daily.     Marland Kitchen omeprazole (PRILOSEC) 40 MG capsule Take 40 mg by mouth daily.    . potassium chloride SA (KLOR-CON M20) 20 MEQ tablet TAKE 2 TABLETS ONE DAY THEN 1 TABLET THE NEXT 180 tablet 1  . rizatriptan (MAXALT-MLT) 10 MG disintegrating tablet Take 10 mg by mouth as needed for migraine. May repeat in 2 hours if needed (Patient not taking: Reported on 06/06/2020)    . rosuvastatin (CRESTOR) 10 MG tablet TAKE 1 TABLET BY MOUTH EVERY DAY (Patient taking differently: Take 10 mg by mouth daily.) 90 tablet 3  . zinc gluconate 50 MG tablet Take 50 mg by mouth daily.     No current facility-administered medications on file prior to visit.    Allergies  Allergen Reactions  . Shellfish Allergy Hives and Swelling  . Ace Inhibitors Other (See Comments)    Pt cannot recall her reaction but tolerates arb   . Gabapentin Other (See Comments)    headaches  . Pitavastatin     Unknown reaction   . Topiramate     Heart race,   . Sulfa Antibiotics Other (See Comments) and Rash    Fine bumps     Assessment/Plan:  1. Hypertension - Patient BP in room today 146/78 which is above goal of <130/80 and slightly above her home readings.  Due to patient's hypokalemia and hypertension, recommend starting spironolactone 12.5mg  once daily in the morning and rechecking lab work next week.  Patient voiced understanding.  Continue irbesartan 300mg  daily Continue metoprolol 12.5mg  once daily Start spironolactone 12.5mg  once daily Check BMP on 06/13/20  Karren Cobble, PharmD, BCACP, [place], [place] [address] [place], [place] [address] Phone: [phone]; Fax: [phone] 06/07/2020 10:40 AM

## 2020-06-07 NOTE — Patient Instructions (Addendum)
It was nice meeting you today!  We would like your blood pressure to be less than 130/80  Please continue your metoprolol 25mg  1/2 tablet once a day and your irbesartan 300mg  once a day  We are going to start a new medication called spironolactone 25mg .  Please take 1/2 tablet once a day in the morning.  This should help your blood pressure and your potassium level  We will check your lab work in 1 week  Karren Cobble, PharmD, BCACP, [place], [place] [address], [place], [place] [address] Phone: [phone]; Fax: [phone] 06/07/2020 9:50 AM

## 2020-06-08 DIAGNOSIS — J9601 Acute respiratory failure with hypoxia: Secondary | ICD-10-CM | POA: Diagnosis not present

## 2020-06-08 DIAGNOSIS — U099 Post covid-19 condition, unspecified: Secondary | ICD-10-CM | POA: Diagnosis not present

## 2020-06-09 ENCOUNTER — Other Ambulatory Visit: Payer: Self-pay | Admitting: Family Medicine

## 2020-06-09 ENCOUNTER — Other Ambulatory Visit: Payer: Self-pay

## 2020-06-09 ENCOUNTER — Encounter: Payer: Self-pay | Admitting: Family Medicine

## 2020-06-09 ENCOUNTER — Ambulatory Visit (HOSPITAL_BASED_OUTPATIENT_CLINIC_OR_DEPARTMENT_OTHER)
Admission: RE | Admit: 2020-06-09 | Discharge: 2020-06-09 | Disposition: A | Discharge status: Home / Self Care | Payer: Medicare Other | Source: Ambulatory Visit | Attending: Family Medicine | Admitting: Family Medicine

## 2020-06-09 DIAGNOSIS — R221 Localized swelling, mass and lump, neck: Secondary | ICD-10-CM | POA: Insufficient documentation

## 2020-06-09 DIAGNOSIS — R222 Localized swelling, mass and lump, trunk: Secondary | ICD-10-CM | POA: Diagnosis not present

## 2020-06-09 DIAGNOSIS — R2241 Localized swelling, mass and lump, right lower limb: Secondary | ICD-10-CM | POA: Diagnosis not present

## 2020-06-09 DIAGNOSIS — I2699 Other pulmonary embolism without acute cor pulmonale: Secondary | ICD-10-CM

## 2020-06-09 DIAGNOSIS — I6523 Occlusion and stenosis of bilateral carotid arteries: Secondary | ICD-10-CM | POA: Diagnosis not present

## 2020-06-12 DIAGNOSIS — J9601 Acute respiratory failure with hypoxia: Secondary | ICD-10-CM | POA: Diagnosis not present

## 2020-06-12 DIAGNOSIS — U099 Post covid-19 condition, unspecified: Secondary | ICD-10-CM | POA: Diagnosis not present

## 2020-06-13 ENCOUNTER — Other Ambulatory Visit: Payer: Medicare Other

## 2020-06-14 ENCOUNTER — Telehealth: Payer: Self-pay | Admitting: *Deleted

## 2020-06-14 ENCOUNTER — Encounter (HOSPITAL_BASED_OUTPATIENT_CLINIC_OR_DEPARTMENT_OTHER): Payer: Self-pay | Admitting: Emergency Medicine

## 2020-06-14 ENCOUNTER — Other Ambulatory Visit: Payer: Self-pay

## 2020-06-14 ENCOUNTER — Emergency Department (HOSPITAL_BASED_OUTPATIENT_CLINIC_OR_DEPARTMENT_OTHER)
Admission: EM | Admit: 2020-06-14 | Discharge: 2020-06-14 | Disposition: A | Discharge status: Home / Self Care | Payer: Medicare Other | Attending: Emergency Medicine | Admitting: Emergency Medicine

## 2020-06-14 DIAGNOSIS — Z79899 Other long term (current) drug therapy: Secondary | ICD-10-CM | POA: Diagnosis not present

## 2020-06-14 DIAGNOSIS — Z7901 Long term (current) use of anticoagulants: Secondary | ICD-10-CM | POA: Diagnosis not present

## 2020-06-14 DIAGNOSIS — K921 Melena: Secondary | ICD-10-CM | POA: Diagnosis not present

## 2020-06-14 DIAGNOSIS — J3089 Other allergic rhinitis: Secondary | ICD-10-CM | POA: Diagnosis not present

## 2020-06-14 DIAGNOSIS — Z87891 Personal history of nicotine dependence: Secondary | ICD-10-CM | POA: Insufficient documentation

## 2020-06-14 DIAGNOSIS — I10 Essential (primary) hypertension: Secondary | ICD-10-CM | POA: Insufficient documentation

## 2020-06-14 DIAGNOSIS — I2510 Atherosclerotic heart disease of native coronary artery without angina pectoris: Secondary | ICD-10-CM | POA: Insufficient documentation

## 2020-06-14 DIAGNOSIS — R42 Dizziness and giddiness: Secondary | ICD-10-CM | POA: Insufficient documentation

## 2020-06-14 DIAGNOSIS — Z8616 Personal history of COVID-19: Secondary | ICD-10-CM | POA: Insufficient documentation

## 2020-06-14 LAB — CBC WITH DIFFERENTIAL/PLATELET
Abs Immature Granulocytes: 0.01 10*3/uL (ref 0.00–0.07)
Basophils Absolute: 0 10*3/uL (ref 0.0–0.1)
Basophils Relative: 1 %
Eosinophils Absolute: 0.2 10*3/uL (ref 0.0–0.5)
Eosinophils Relative: 3 %
HCT: 38.1 % (ref 36.0–46.0)
Hemoglobin: 12 g/dL (ref 12.0–15.0)
Immature Granulocytes: 0 %
Lymphocytes Relative: 35 %
Lymphs Abs: 2.1 10*3/uL (ref 0.7–4.0)
MCH: 28.2 pg (ref 26.0–34.0)
MCHC: 31.5 g/dL (ref 30.0–36.0)
MCV: 89.6 fL (ref 80.0–100.0)
Monocytes Absolute: 0.4 10*3/uL (ref 0.1–1.0)
Monocytes Relative: 6 %
Neutro Abs: 3.4 10*3/uL (ref 1.7–7.7)
Neutrophils Relative %: 55 %
Platelets: 323 10*3/uL (ref 150–400)
RBC: 4.25 MIL/uL (ref 3.87–5.11)
RDW: 15.6 % — ABNORMAL HIGH (ref 11.5–15.5)
WBC: 6.2 10*3/uL (ref 4.0–10.5)
nRBC: 0 % (ref 0.0–0.2)

## 2020-06-14 LAB — COMPREHENSIVE METABOLIC PANEL
ALT: 12 U/L (ref 0–44)
AST: 19 U/L (ref 15–41)
Albumin: 3.7 g/dL (ref 3.5–5.0)
Alkaline Phosphatase: 59 U/L (ref 38–126)
Anion gap: 9 (ref 5–15)
BUN: 10 mg/dL (ref 8–23)
CO2: 28 mmol/L (ref 22–32)
Calcium: 9.3 mg/dL (ref 8.9–10.3)
Chloride: 102 mmol/L (ref 98–111)
Creatinine, Ser: 0.69 mg/dL (ref 0.44–1.00)
GFR, Estimated: >60 mL/min (ref >60)
Glucose, Bld: 90 mg/dL (ref 70–99)
Potassium: 3.4 mmol/L — ABNORMAL LOW (ref 3.5–5.1)
Sodium: 139 mmol/L (ref 135–145)
Total Bilirubin: 0.5 mg/dL (ref 0.3–1.2)
Total Protein: 7.4 g/dL (ref 6.5–8.1)

## 2020-06-14 LAB — OCCULT BLOOD X 1 CARD TO LAB, STOOL: Fecal Occult Bld: NEGATIVE

## 2020-06-14 MED ORDER — POTASSIUM CHLORIDE CRYS ER 20 MEQ PO TBCR
20.0000 meq | EXTENDED_RELEASE_TABLET | Freq: Once | ORAL | Status: AC
  Administered 2020-06-14: 20 meq via ORAL
  Filled 2020-06-14: qty 1

## 2020-06-14 NOTE — Progress Notes (Signed)
VIAL EXP 06-14-21

## 2020-06-14 NOTE — Telephone Encounter (Signed)
Patient went to ED

## 2020-06-14 NOTE — ED Triage Notes (Signed)
Patient complaining of light red blood in her stool x 1 month. No pain, no n/v. Reports lightheadedness.

## 2020-06-14 NOTE — ED Provider Notes (Signed)
[facility] EMERGENCY DEPARTMENT Provider Note   CSN: [id number] Arrival date & time: 06/14/20  1023     History No chief complaint on file.   Stephanie Parks is a 75 y.o. female.  HPI 75 year old female presents with blood in the stool.  This happened last month and she saw her PCP but it never seemed to happen again.  Happened again this morning when she had a bowel movement.  She took a laxative which she frequently has to take for chronic constipation.  Had a bowel movement this morning that had blood in it.  The stool otherwise was brown and not melanotic.  Had another bowel movement later this morning that was normal.  No abdominal pain, chest pain, shortness of breath.  She has chronic lightheadedness that she has had for months that is not worse today.  She is on Eliquis.   Past Medical History:  Diagnosis Date  . 3-vessel CAD 03/01/2015  . Achalasia 09/28/2012  . Allergic rhinitis 03/01/2015  . Anemia   . Aneurysm ([place]) 03/01/2015  . BP (high blood pressure) 03/01/2015  . Bronchitis 04/23/2018  . Cephalalgia 08/24/2012   Overview:  ICD-10 cut over    . Chest pain 04/04/2011  . CN (constipation) 09/28/2012  . Coughing 04/24/2017  . Decreased potassium in the blood 03/01/2015  . Diverticulosis   . Dizziness 03/01/2015  . GERD (gastroesophageal reflux disease)   . Hiatal hernia   . Hypercholesterolemia 03/01/2015  . Hyperlipidemia   . Hypertension   . Ingrown toenail 08/10/2017  . Inguinal hernia   . Migraine headache   . Onychomycosis 12/08/2017  . Paresthesia of arm 03/01/2015  . Schatzki's ring   . Tendonitis, Achilles, right 12/08/2017  . Unilateral primary osteoarthritis, left knee 10/07/2017    Patient Active Problem List   Diagnosis Date Noted  . Headache 06/07/2020  . Acute respiratory failure with hypoxia ([place]) 05/02/2020  . Post-COVID chronic dyspnea 05/02/2020  . Pulsatile neck mass 04/26/2020  . Pneumonia due to COVID-19 virus 03/12/2020  . Headache  disorder 01/04/2020  . Seasonal allergic conjunctivitis 10/04/2019  . Dysfunction of right eustachian tube 10/04/2019  . Pre-diabetes 06/02/2019  . Seasonal and perennial allergic rhinitis 12/22/2018  . Heart palpitations 12/22/2018  . History of chest pain 07/20/2018  . Brain aneurysm 07/20/2018  . Dyslipidemia 05/04/2018  . Bronchitis 04/23/2018  . Tendonitis, Achilles, right 12/08/2017  . Onychomycosis 12/08/2017  . Unilateral primary osteoarthritis, left knee 10/07/2017  . Ingrown toenail 08/10/2017  . Coughing 04/24/2017  . CAD (coronary artery disease) 03/01/2015  . Aneurysm ([place]) 03/01/2015  . Dizziness 03/01/2015  . Paresthesia of arm 03/01/2015  . Seasonal allergic rhinitis 03/01/2015  . Achalasia 09/28/2012  . CN (constipation) 09/28/2012  . Cephalalgia 08/24/2012  . Essential hypertension 04/19/2011  . Hyperlipidemia 04/19/2011  . GERD (gastroesophageal reflux disease) 04/19/2011  . Chest pain 04/04/2011    Past Surgical History:  Procedure Laterality Date  . ABDOMINAL HYSTERECTOMY    . BACK SURGERY     x 3  . CARPAL TUNNEL RELEASE     left wrist  . CERVICAL SPINE SURGERY    . HEMORRHOID SURGERY       OB History   No obstetric history on file.     Family History  Problem Relation Age of Onset  . Allergic rhinitis Sister   . Diabetes Sister   . Hypertension Sister   . Heart attack Father 29  . Heart disease Father   .  Stomach cancer Maternal Grandmother   . Heart disease Mother   . Colon cancer Neg Hx   . Angioedema Neg Hx   . Asthma Neg Hx   . Eczema Neg Hx   . Urticaria Neg Hx   . Immunodeficiency Neg Hx   . Breast cancer Neg Hx   . Migraines Neg Hx   . Headache Neg Hx     Social History   Tobacco Use  . Smoking status: Former Smoker    Packs/day: 0.50    Years: 20.00    Pack years: 10.00    Types: Cigarettes    Quit date: 02/19/1984    Years since quitting: 36.3  . Smokeless tobacco: Never Used  Vaping Use  . Vaping Use: Never  used  Substance Use Topics  . Alcohol use: No  . Drug use: No    Home Medications Prior to Admission medications   Medication Sig Start Date End Date Taking? Authorizing Provider  apixaban (ELIQUIS) 5 MG TABS tablet Take 1 tablet (5 mg total) by mouth 2 (two) times daily. 06/12/20  Yes Copland, Gay Filler, MD  metoprolol succinate (TOPROL-XL) 25 MG 24 hr tablet Take 0.5 tablets (12.5 mg total) by mouth daily. 09/06/19  Yes Copland, Gay Filler, MD  Multiple Vitamins-Minerals (MULTIVITAL) tablet Take 1 tablet by mouth daily.    Yes [provider]  omeprazole (PRILOSEC) 40 MG capsule Take 40 mg by mouth daily.   Yes [provider]  potassium chloride SA (KLOR-CON M20) 20 MEQ tablet TAKE 2 TABLETS ONE DAY THEN 1 TABLET THE NEXT 06/05/20  Yes Copland, Gay Filler, MD  rosuvastatin (CRESTOR) 10 MG tablet TAKE 1 TABLET BY MOUTH EVERY DAY Patient taking differently: Take 10 mg by mouth daily. 01/11/20  Yes Copland, Gay Filler, MD  acetaminophen (TYLENOL) 325 MG tablet Take 650 mg by mouth. For headaches    [provider]  albuterol (PROAIR HFA) 108 (90 Base) MCG/ACT inhaler Inhale 2 puffs into the lungs every 4 (four) hours as needed for wheezing or shortness of breath. 04/04/20   Althea Charon, FNP  ascorbic acid (VITAMIN C) 500 MG tablet Take 500 mg by mouth daily.    [provider]  Calcium Carb-Cholecalciferol (CALCIUM 600 + D PO) Take 1 tablet by mouth daily.    [provider]  ELDERBERRY PO Take 50 mg by mouth daily.    [provider]  EPINEPHrine (EPIPEN 2-PAK) 0.3 mg/0.3 mL IJ SOAJ injection Inject 0.3 mLs (0.3 mg total) into the muscle as needed for anaphylaxis. Per allergen immunotherapy protocol 10/04/19   Dara Hoyer, FNP  esomeprazole (NEXIUM) 40 MG capsule Take 1 capsule (40 mg total) by mouth daily. 05/26/20   Shelda Pal, DO  famotidine (PEPCID) 20 MG tablet Take 1 tablet (20 mg total) by mouth 2 (two) times daily. 05/24/20    Shelda Pal, DO  fluticasone (FLONASE) 50 MCG/ACT nasal spray USE 2 SPRAYS IN NOSTRIL(S) DAILY AS NEEDED.  In the right nostril point the applicator out toward your right ear.  In the left nostril point the applicator out toward her left ear Patient taking differently: Place 2 sprays into both nostrils daily as needed for allergies. 10/04/19   Dara Hoyer, FNP  irbesartan (AVAPRO) 300 MG tablet Take 1 tablet (300 mg total) by mouth daily. 05/15/20   Jettie Booze, MD  rizatriptan (MAXALT-MLT) 10 MG disintegrating tablet Take 10 mg by mouth as needed for migraine. May repeat in  2 hours if needed Patient not taking: No sig reported    [provider]  spironolactone (ALDACTONE) 25 MG tablet Take 0.5 tablets (12.5 mg total) by mouth daily. 06/07/20   Jettie Booze, MD  zinc gluconate 50 MG tablet Take 50 mg by mouth daily.    [provider]    Allergies    Shellfish allergy, Ace inhibitors, Gabapentin, Pitavastatin, Topiramate, and Sulfa antibiotics  Review of Systems   Review of Systems  Respiratory: Negative for shortness of breath.   Cardiovascular: Negative for chest pain.  Gastrointestinal: Positive for blood in stool and constipation. Negative for abdominal pain.  Neurological: Positive for light-headedness.  All other systems reviewed and are negative.   Physical Exam Updated Vital Signs BP (!) 163/78   Pulse (!) 57   Temp 98.5 F (36.9 C) (Oral)   Resp 14   Ht 5\' 5"  (1.651 m)   Wt 85.3 kg   SpO2 100%   BMI 31.28 kg/m   Physical Exam Vitals and nursing note reviewed. Exam conducted with a chaperone present.  Constitutional:      Appearance: She is well-developed.  HENT:     Head: Normocephalic and atraumatic.     Right Ear: External ear normal.     Left Ear: External ear normal.     Nose: Nose normal.  Eyes:     General:        Right eye: No discharge.        Left eye: No discharge.  Cardiovascular:     Rate and Rhythm:  Normal rate and regular rhythm.     Heart sounds: Normal heart sounds.  Pulmonary:     Effort: Pulmonary effort is normal.     Breath sounds: Normal breath sounds.  Abdominal:     General: There is no distension.     Palpations: Abdomen is soft.     Tenderness: There is no abdominal tenderness.  Genitourinary:    Rectum: No mass, tenderness or external hemorrhoid. Normal anal tone.     Comments: Light brown stool on DRE. No gross blood Skin:    General: Skin is warm and dry.  Neurological:     Mental Status: She is alert.  Psychiatric:        Mood and Affect: Mood is not anxious.     ED Results / Procedures / Treatments   Labs (all labs ordered are listed, but only abnormal results are displayed) Labs Reviewed  COMPREHENSIVE METABOLIC PANEL - Abnormal; Notable for the following components:      Result Value   Potassium 3.4 (*)    All other components within normal limits  CBC WITH DIFFERENTIAL/PLATELET - Abnormal; Notable for the following components:   RDW 15.6 (*)    All other components within normal limits  OCCULT BLOOD X 1 CARD TO LAB, STOOL    EKG EKG Interpretation  Date/Time:  Wednesday June 14 2020 10:38:20 EDT Ventricular Rate:  63 PR Interval:  167 QRS Duration: 95 QT Interval:  409 QTC Calculation: 419 R Axis:   -5 Text Interpretation: Sinus rhythm no acute ST/T changes similar to Mar 2022 Confirmed by Sherwood Gambler [phone]) on 06/14/2020 10:51:04 AM   Radiology No results found.  Procedures Procedures   Medications Ordered in ED Medications  potassium chloride SA (KLOR-CON) CR tablet 20 mEq (20 mEq Oral Given 06/14/20 1137)    ED Course  I have reviewed the triage vital signs and the nursing notes.  Pertinent labs &  imaging results that were available during my care of the patient were reviewed by me and considered in my medical decision making (see chart for details).    MDM Rules/Calculators/A&P                          Patient's  rectal exam shows no obvious abnormalities, including no blood or melena. The lightheadedness is a chronic issue. No recurrent bleeding. She will need outpatient GI follow up. Will refer back to PCP, discuss she should return if bleeding recurs or worsens. Will replace potassium Final Clinical Impression(s) / ED Diagnoses Final diagnoses:  Blood in stool    Rx / DC Orders ED Discharge Orders    None       Sherwood Gambler, MD 06/14/20 1201

## 2020-06-14 NOTE — Telephone Encounter (Signed)
Call Type Triage / Clinical Relationship To Patient Self Return Phone Number [phone] (Primary) Chief Complaint Blood In Stool Reason for Call Symptomatic / Request for [place] has blood in stool Translation No Nurse Assessment Nurse: Radford Pax, RN, Eugene Garnet Date/Time (Eastern Time): 06/14/2020 8:57:34 AM Confirm and document reason for call. If symptomatic, describe symptoms. ---Caller has blood in stool. had taken a laxative last night

## 2020-06-15 ENCOUNTER — Encounter: Payer: Self-pay | Admitting: Family Medicine

## 2020-06-15 DIAGNOSIS — J9601 Acute respiratory failure with hypoxia: Secondary | ICD-10-CM | POA: Diagnosis not present

## 2020-06-15 DIAGNOSIS — U099 Post covid-19 condition, unspecified: Secondary | ICD-10-CM | POA: Diagnosis not present

## 2020-06-16 ENCOUNTER — Other Ambulatory Visit: Payer: Self-pay | Admitting: Family Medicine

## 2020-06-16 ENCOUNTER — Institutional Professional Consult (permissible substitution): Payer: Medicare Other | Admitting: Neurology

## 2020-06-19 ENCOUNTER — Other Ambulatory Visit: Payer: Self-pay

## 2020-06-19 ENCOUNTER — Ambulatory Visit (INDEPENDENT_AMBULATORY_CARE_PROVIDER_SITE_OTHER): Payer: Medicare Other

## 2020-06-19 DIAGNOSIS — R222 Localized swelling, mass and lump, trunk: Secondary | ICD-10-CM | POA: Diagnosis not present

## 2020-06-19 DIAGNOSIS — Z981 Arthrodesis status: Secondary | ICD-10-CM | POA: Diagnosis not present

## 2020-06-19 DIAGNOSIS — R221 Localized swelling, mass and lump, neck: Secondary | ICD-10-CM

## 2020-06-19 DIAGNOSIS — I6523 Occlusion and stenosis of bilateral carotid arteries: Secondary | ICD-10-CM | POA: Diagnosis not present

## 2020-06-19 DIAGNOSIS — U071 COVID-19: Secondary | ICD-10-CM | POA: Diagnosis not present

## 2020-06-19 MED ORDER — IOHEXOL 350 MG/ML SOLN
100.0000 mL | Freq: Once | INTRAVENOUS | Status: AC | PRN
  Administered 2020-06-19: 75 mL via INTRAVENOUS

## 2020-06-20 ENCOUNTER — Telehealth: Payer: Self-pay

## 2020-06-20 DIAGNOSIS — Z9189 Other specified personal risk factors, not elsewhere classified: Secondary | ICD-10-CM

## 2020-06-20 NOTE — Progress Notes (Signed)
[facility])                                            [place] Team                                        Statin Quality Measure Assessment    06/20/2020  Stephanie Parks 31-Jul-1945 [id number]   Next Appointment w/PCP: 07/12/2020  Per review of chart and payor information, this patient has been flagged for non-adherence to the following CMS Quality Measure:   []  Statin Use in Persons with Diabetes  [x]  Statin Use in Persons with Cardiovascular Disease  The ASCVD Risk score [name], et al., 2013) failed to calculate for the following reasons:   The patient has a prior MI or stroke diagnosis  06/02/2019  Currently prescribed statin:  [x]  Yes []  No     Comments: Rosuvastatin 10 mg daily - per [facility] patient last picked up 02/10/2020. Per patient she denies being noncompliant and stated she has a stockpile of rosuvastatin 2/2 to [facility] automated prescription fill services. She also has three weeks left of medication.     Thank you for your time,  Kristeen Miss, [place] Cell: [phone]

## 2020-06-21 ENCOUNTER — Telehealth: Payer: Self-pay | Admitting: Pharmacist

## 2020-06-21 DIAGNOSIS — U099 Post covid-19 condition, unspecified: Secondary | ICD-10-CM | POA: Diagnosis not present

## 2020-06-21 DIAGNOSIS — I10 Essential (primary) hypertension: Secondary | ICD-10-CM

## 2020-06-21 DIAGNOSIS — J9601 Acute respiratory failure with hypoxia: Secondary | ICD-10-CM | POA: Diagnosis not present

## 2020-06-21 MED ORDER — LOSARTAN POTASSIUM 100 MG PO TABS: 100.0000 mg | ORAL_TABLET | Freq: Every day | ORAL | 1 refills | Status: DC

## 2020-06-21 NOTE — Telephone Encounter (Signed)
Patient called and reported she feels poorly with a headache and dizziness which she thinks is related to her irbesartan.  Requests to switch back to losartan since she was tolerating well.  Advised patient to call back if symptoms do not subside and will likely have to try different therapy.

## 2020-06-22 DIAGNOSIS — J9601 Acute respiratory failure with hypoxia: Secondary | ICD-10-CM | POA: Diagnosis not present

## 2020-06-22 DIAGNOSIS — U099 Post covid-19 condition, unspecified: Secondary | ICD-10-CM | POA: Diagnosis not present

## 2020-06-23 ENCOUNTER — Telehealth: Payer: Self-pay | Admitting: Pharmacist

## 2020-06-23 ENCOUNTER — Other Ambulatory Visit: Payer: Medicare Other

## 2020-06-23 NOTE — Telephone Encounter (Signed)
Patient called.  Restarted losartan yesterday however still feels dizzy and lightheaded.  Patient reports it has now been lasting a month.  Recommended she contact her PCP for further assessment and patient was agreeable

## 2020-06-26 DIAGNOSIS — U099 Post covid-19 condition, unspecified: Secondary | ICD-10-CM | POA: Diagnosis not present

## 2020-06-26 DIAGNOSIS — J9601 Acute respiratory failure with hypoxia: Secondary | ICD-10-CM | POA: Diagnosis not present

## 2020-06-28 DIAGNOSIS — R2681 Unsteadiness on feet: Secondary | ICD-10-CM | POA: Diagnosis not present

## 2020-06-28 DIAGNOSIS — G43009 Migraine without aura, not intractable, without status migrainosus: Secondary | ICD-10-CM | POA: Diagnosis not present

## 2020-06-29 DIAGNOSIS — J9601 Acute respiratory failure with hypoxia: Secondary | ICD-10-CM | POA: Diagnosis not present

## 2020-06-29 DIAGNOSIS — U099 Post covid-19 condition, unspecified: Secondary | ICD-10-CM | POA: Diagnosis not present

## 2020-06-30 ENCOUNTER — Institutional Professional Consult (permissible substitution): Payer: Medicare Other | Admitting: Neurology

## 2020-07-03 ENCOUNTER — Other Ambulatory Visit: Payer: Self-pay

## 2020-07-03 ENCOUNTER — Ambulatory Visit (INDEPENDENT_AMBULATORY_CARE_PROVIDER_SITE_OTHER): Payer: Medicare Other

## 2020-07-03 DIAGNOSIS — U099 Post covid-19 condition, unspecified: Secondary | ICD-10-CM | POA: Diagnosis not present

## 2020-07-03 DIAGNOSIS — J309 Allergic rhinitis, unspecified: Secondary | ICD-10-CM

## 2020-07-03 DIAGNOSIS — J9601 Acute respiratory failure with hypoxia: Secondary | ICD-10-CM | POA: Diagnosis not present

## 2020-07-04 ENCOUNTER — Telehealth: Payer: Self-pay | Admitting: Family Medicine

## 2020-07-04 ENCOUNTER — Ambulatory Visit (INDEPENDENT_AMBULATORY_CARE_PROVIDER_SITE_OTHER): Payer: Medicare Other

## 2020-07-04 DIAGNOSIS — J1282 Pneumonia due to coronavirus disease 2019: Secondary | ICD-10-CM

## 2020-07-04 DIAGNOSIS — G4733 Obstructive sleep apnea (adult) (pediatric): Secondary | ICD-10-CM

## 2020-07-04 DIAGNOSIS — I10 Essential (primary) hypertension: Secondary | ICD-10-CM

## 2020-07-04 DIAGNOSIS — Z9989 Dependence on other enabling machines and devices: Secondary | ICD-10-CM

## 2020-07-04 NOTE — Telephone Encounter (Signed)
Patient states she when up 5 lbs in a week. Wants to know if is normal. Try to schedule appt pt refuse stating she would like to speak to a nurse.   Please advise

## 2020-07-04 NOTE — Patient Instructions (Signed)
Visit Information: Thank you for taking the time to speak with me today.  PATIENT GOALS: Goals Addressed            This Visit's Progress   . Increase activity and improve endurance   On track    Timeframe:  Long-Range Goal Priority:  Medium Start Date:  05/08/20                           Expected End Date:  08/07/20                     Follow up date 08/08/20  Continue to participate with pulmonary rehab therapy as scheduled Continue to take medications as prescribed.  Continue to attend scheduled provider appointments: as of today- Dr. Lorelei Pont 07/12/20; Dr. Dan Europe 07/26/20; Dr. Irish Lack 08/10/20 Continue to Eat Healthy meal plan: foods low in salt and heart healthy meals full of fruits, vegetables, whole grains, lean protein and limit fat, and sugars. It may also help to eat smaller portions 5-6 times a day.  Call your provider if you have any questions or concerns.       . Track and Manage My Blood Pressure-Hypertension   On track    Timeframe:  Long-Range Goal Priority:  Medium Start Date:  05/08/20                           Expected End Date:  08/07/20                     Follow Up Date 08/08/20    - continue to check blood pressure as recommended by provider - continue to write blood pressure results in a log or diary and take to provider appointments. - attend provider visits as scheduled - Continue your plan to eat healthy: low salt and heart healthy meals with fruits, vegetables, whole grains, lean protein and limit fats and sugars. - continue attending cardiac rehab therapy sessions and follow recommended treatment plan.   - continue to monitor sodium intake.  - review educational material on healthy eating. - know your Target blood pressure goal and your Target blood pressure range    Why is this important?    You won't feel high blood pressure, but it can still hurt your blood vessels.   High blood pressure can cause heart or kidney problems. It can also cause a  stroke.   Making lifestyle changes like losing a little weight or eating less salt will help.   Checking your blood pressure at home and at different times of the day can help to control blood pressure.   If the doctor prescribes medicine remember to take it the way the doctor ordered.   Call the office if you cannot afford the medicine or if there are questions about it.           Patient verbalizes understanding of instructions provided today and agrees to view in [place].   Telephone follow up appointment with care management team member scheduled for:08/08/20 The patient has been provided with contact information for the care management team and has been advised to call with any health related questions or concerns.   Thea Silversmith, RN, MSN, BSN, CCM Care Management Coordinator [facility] [phone]

## 2020-07-04 NOTE — Telephone Encounter (Signed)
Left message to return call 

## 2020-07-04 NOTE — Chronic Care Management (AMB) (Signed)
Chronic Care Management   CCM RN Visit Note  07/04/2020 Name: Stephanie Parks MRN: [id number] DOB: [birth year]  Subjective: Stephanie Parks is a 75 y.o. year old female who is a primary care patient of Copland, Gay Filler, MD. The care management team was consulted for assistance with disease management and care coordination needs.    Engaged with patient by telephone for follow up visit in response to provider referral for case management and/or care coordination services.   Consent to Services:  The patient was given information about Chronic Care Management services, agreed to services, and gave verbal consent prior to initiation of services.  Please see initial visit note for detailed documentation.   Patient agreed to services and verbal consent obtained.   Assessment: Review of patient past medical history, allergies, medications, health status, including review of consultants reports, laboratory and other test data, was performed as part of comprehensive evaluation and provision of chronic care management services.   SDOH (Social Determinants of Health) assessments and interventions performed:    CCM Care Plan  Allergies  Allergen Reactions  . Shellfish Allergy Hives and Swelling  . Ace Inhibitors Other (See Comments)    Pt cannot recall her reaction but tolerates arb   . Gabapentin Other (See Comments)    headaches  . Pitavastatin     Unknown reaction   . Topiramate     Heart race,   . Sulfa Antibiotics Other (See Comments) and Rash    Fine bumps    Outpatient Encounter Medications as of 07/04/2020  Medication Sig Note  . acetaminophen (TYLENOL) 325 MG tablet Take 650 mg by mouth. For headaches   . albuterol (PROAIR HFA) 108 (90 Base) MCG/ACT inhaler Inhale 2 puffs into the lungs every 4 (four) hours as needed for wheezing or shortness of breath.   Marland Kitchen ascorbic acid (VITAMIN C) 500 MG tablet Take 500 mg by mouth daily.   . Calcium Carb-Cholecalciferol (CALCIUM 600 + D  PO) Take 1 tablet by mouth daily.   Marland Kitchen ELDERBERRY PO Take 50 mg by mouth daily.   Marland Kitchen esomeprazole (NEXIUM) 40 MG capsule Take 1 capsule (40 mg total) by mouth daily.   . fluticasone (FLONASE) 50 MCG/ACT nasal spray USE 2 SPRAYS IN NOSTRIL(S) DAILY AS NEEDED.  In the right nostril point the applicator out toward your right ear.  In the left nostril point the applicator out toward her left ear (Patient taking differently: Place 2 sprays into both nostrils daily as needed for allergies.)   . losartan (COZAAR) 100 MG tablet Take 1 tablet (100 mg total) by mouth daily.   . metoprolol succinate (TOPROL-XL) 25 MG 24 hr tablet Take 0.5 tablets (12.5 mg total) by mouth daily.   . Multiple Vitamins-Minerals (MULTIVITAL) tablet Take 1 tablet by mouth daily.    . potassium chloride SA (KLOR-CON M20) 20 MEQ tablet TAKE 2 TABLETS ONE DAY THEN 1 TABLET THE NEXT 07/04/2020: Take tablet daily, but Alternates to two tablets every other day.   . rosuvastatin (CRESTOR) 10 MG tablet TAKE 1 TABLET BY MOUTH EVERY DAY (Patient taking differently: Take 10 mg by mouth daily.)   . zinc gluconate 50 MG tablet Take 50 mg by mouth daily.   Marland Kitchen apixaban (ELIQUIS) 5 MG TABS tablet Take 1 tablet (5 mg total) by mouth 2 (two) times daily.   Marland Kitchen EPINEPHrine (EPIPEN 2-PAK) 0.3 mg/0.3 mL IJ SOAJ injection Inject 0.3 mLs (0.3 mg total) into the muscle as needed for anaphylaxis. Per  allergen immunotherapy protocol   . famotidine (PEPCID) 20 MG tablet Take 1 tablet (20 mg total) by mouth 2 (two) times daily. (Patient not taking: Reported on 07/04/2020)   . omeprazole (PRILOSEC) 40 MG capsule Take 40 mg by mouth daily. (Patient not taking: Reported on 07/04/2020)   . rizatriptan (MAXALT-MLT) 10 MG disintegrating tablet Take 10 mg by mouth as needed for migraine. May repeat in 2 hours if needed (Patient not taking: No sig reported)   . spironolactone (ALDACTONE) 25 MG tablet Take 0.5 tablets (12.5 mg total) by mouth daily.    No  facility-administered encounter medications on file as of 07/04/2020.    Patient Active Problem List   Diagnosis Date Noted  . Headache 06/07/2020  . Acute respiratory failure with hypoxia ([place]) 05/02/2020  . Post-COVID chronic dyspnea 05/02/2020  . Pulsatile neck mass 04/26/2020  . Pneumonia due to COVID-19 virus 03/12/2020  . Headache disorder 01/04/2020  . Seasonal allergic conjunctivitis 10/04/2019  . Dysfunction of right eustachian tube 10/04/2019  . Pre-diabetes 06/02/2019  . Seasonal and perennial allergic rhinitis 12/22/2018  . Heart palpitations 12/22/2018  . History of chest pain 07/20/2018  . Brain aneurysm 07/20/2018  . Dyslipidemia 05/04/2018  . Bronchitis 04/23/2018  . Tendonitis, Achilles, right 12/08/2017  . Onychomycosis 12/08/2017  . Unilateral primary osteoarthritis, left knee 10/07/2017  . Ingrown toenail 08/10/2017  . Coughing 04/24/2017  . CAD (coronary artery disease) 03/01/2015  . Aneurysm ([place]) 03/01/2015  . Dizziness 03/01/2015  . Paresthesia of arm 03/01/2015  . Seasonal allergic rhinitis 03/01/2015  . Achalasia 09/28/2012  . CN (constipation) 09/28/2012  . Cephalalgia 08/24/2012  . Essential hypertension 04/19/2011  . Hyperlipidemia 04/19/2011  . GERD (gastroesophageal reflux disease) 04/19/2011  . Chest pain 04/04/2011    Conditions to be addressed/monitored:HTN and post covid pneumonia/pulmonary embolus   Care Plan : Hypertension (Adult)  Updates made by Luretha Rued, RN since 07/05/2020 12:00 AM    Problem: Disease Progression (Hypertension)     Long-Range Goal: Disease Progression Prevented or Minimized   Start Date: 05/08/2020  Expected End Date: 08/06/2020  This Visit's Progress: On track  Recent Progress: On track  Priority: Medium  Note:   Objective:  . Last practice recorded BP readings:  BP Readings from Last 3 Encounters:  05/01/20 (!) 160/78  04/24/20 140/70  04/20/20 (!) 150/72   Current Barriers:  Marland Kitchen Knowledge  Deficits related to long term self-management of hypertension. Client reports her blood pressure are improving. Overall she reports feeling and doing much better than last encounter. She states medication changes most recent now taking Losartan and nortriptyline. She reports nortryptiline added for headache and dizziness. She states that both headache and dizziness have improved. Client reports weight increase from 189 pounds on 06/28/20 to 194 pounds today. She reports she has been around 188 pounds since discharge from the hospital. She is wandering if recent medication addition of nortriptyline or losartan can be causing the weight increase. She reports she is awaiting a return call from her provider office. She has not checked blood pressure today, but states she checks it every day and it is checked three days a week at cardiac rehab. And states it is ranging 097'D-532'D systolic. She continues to see [facility] Pharmacist.  Case Manager Clinical Goal(s):   patient will verbalize understanding of plan for hypertension management  patient will attend all scheduled medical appointments  patient will demonstrate improved adherence to prescribed treatment plan for hypertension as evidenced by taking  all medications as prescribed, monitoring and recording blood pressure as directed, adhering to low sodium/DASH diet; taking medications as prescribed; knowing target parameters for blood pressure and calling provider with questions/concerns as needed. Interventions:   Collaboration with Copland, Gay Filler, MD regarding development and update of comprehensive plan of care as evidenced by provider attestation and co-signature  Inter-disciplinary care team collaboration (see longitudinal plan of care)  Encouraged to continue to monitor salt intake  Send Emmi education: Heart Healthy diet; DASH diet  Reviewed medications with patient. RNCM also encouraged client to call pharmacist she is working with  at [facility] provider office as resource for medication side effects.  Reviewed scheduled/upcoming provider appointments including: as of today- Dr. Lorelei Pont 07/12/20; Dr. Dan Europe 07/26/20; Dr. Irish Lack 08/10/20  Discussed blood pressure readings  Discussed plans with patient for ongoing care management follow up and provided patient with direct contact information for care management team Self-Care Activities: . Take medications as prescribed . Attends scheduled provider appointments . Calls provider office for new concerns, questions . Monitors Blood pressure Patient Goals:  continue to check blood pressure as recommended by provider  continue to write blood pressure results in a log or diary and take to provider appointments.  attend provider visits as scheduled  continue your plan to eat healthy: low salt and heart healthy meals with fruits, vegetables, whole grains, lean protein and limit fats and sugars.  continue attending cardiac rehab therapy sessions and follow recommended treatment plan.    continue to monitor sodium intake.   review educational material on Heart Healthy diet and DASH diet. Plan to discuss at next telephone visit.  know your Target blood pressure goal and your Target blood pressure range  Follow Up Plan: The patient has been provided with contact information for the care management team and has been advised to call with any health related questions or concerns.  The care management team will reach out to the patient again 08/08/20.       Plan:Telephone follow up appointment with care management team member scheduled for:  08/08/20 and The patient has been provided with contact information for the care management team and has been advised to call with any health related questions or concerns.   Thea Silversmith, RN, MSN, BSN, CCM Care Management Coordinator [facility] [phone]

## 2020-07-05 ENCOUNTER — Encounter: Payer: Self-pay | Admitting: Family Medicine

## 2020-07-05 DIAGNOSIS — U099 Post covid-19 condition, unspecified: Secondary | ICD-10-CM | POA: Diagnosis not present

## 2020-07-05 DIAGNOSIS — J9601 Acute respiratory failure with hypoxia: Secondary | ICD-10-CM | POA: Diagnosis not present

## 2020-07-05 NOTE — Telephone Encounter (Signed)
Left message to return call. Sent patient mychart message.

## 2020-07-05 NOTE — Progress Notes (Signed)
Echocardiogram in January showed mild diastolic dysfunction  1. Left ventricular ejection fraction, by estimation, is 55 to 60%. The  left ventricle has normal function. The left ventricle has no regional  wall motion abnormalities. There is moderate left ventricular hypertrophy.  Left ventricular diastolic  parameters are consistent with Grade I diastolic dysfunction (impaired  relaxation).   She has had a few BNP levels over the last year, all reassuring  As such, I do not think her weight gain is likely due to heart failure  I will send her a MyChart message

## 2020-07-06 DIAGNOSIS — J9601 Acute respiratory failure with hypoxia: Secondary | ICD-10-CM | POA: Diagnosis not present

## 2020-07-06 DIAGNOSIS — U099 Post covid-19 condition, unspecified: Secondary | ICD-10-CM | POA: Diagnosis not present

## 2020-07-07 DIAGNOSIS — J9601 Acute respiratory failure with hypoxia: Secondary | ICD-10-CM | POA: Diagnosis not present

## 2020-07-10 ENCOUNTER — Emergency Department (HOSPITAL_BASED_OUTPATIENT_CLINIC_OR_DEPARTMENT_OTHER)
Admission: EM | Admit: 2020-07-10 | Discharge: 2020-07-10 | Disposition: A | Discharge status: Home / Self Care | Payer: Medicare Other | Attending: Emergency Medicine | Admitting: Emergency Medicine

## 2020-07-10 ENCOUNTER — Encounter (HOSPITAL_BASED_OUTPATIENT_CLINIC_OR_DEPARTMENT_OTHER): Payer: Self-pay | Admitting: Emergency Medicine

## 2020-07-10 ENCOUNTER — Emergency Department (HOSPITAL_BASED_OUTPATIENT_CLINIC_OR_DEPARTMENT_OTHER): Payer: Medicare Other

## 2020-07-10 ENCOUNTER — Ambulatory Visit (INDEPENDENT_AMBULATORY_CARE_PROVIDER_SITE_OTHER): Payer: Medicare Other

## 2020-07-10 ENCOUNTER — Other Ambulatory Visit: Payer: Self-pay

## 2020-07-10 DIAGNOSIS — I2510 Atherosclerotic heart disease of native coronary artery without angina pectoris: Secondary | ICD-10-CM | POA: Insufficient documentation

## 2020-07-10 DIAGNOSIS — Z7901 Long term (current) use of anticoagulants: Secondary | ICD-10-CM | POA: Insufficient documentation

## 2020-07-10 DIAGNOSIS — R519 Headache, unspecified: Secondary | ICD-10-CM | POA: Diagnosis not present

## 2020-07-10 DIAGNOSIS — I10 Essential (primary) hypertension: Secondary | ICD-10-CM | POA: Insufficient documentation

## 2020-07-10 DIAGNOSIS — Z8616 Personal history of COVID-19: Secondary | ICD-10-CM | POA: Diagnosis not present

## 2020-07-10 DIAGNOSIS — R42 Dizziness and giddiness: Secondary | ICD-10-CM

## 2020-07-10 DIAGNOSIS — J309 Allergic rhinitis, unspecified: Secondary | ICD-10-CM | POA: Diagnosis not present

## 2020-07-10 DIAGNOSIS — Z87891 Personal history of nicotine dependence: Secondary | ICD-10-CM | POA: Diagnosis not present

## 2020-07-10 DIAGNOSIS — Z79899 Other long term (current) drug therapy: Secondary | ICD-10-CM | POA: Insufficient documentation

## 2020-07-10 LAB — CBC WITH DIFFERENTIAL/PLATELET
Abs Immature Granulocytes: 0.01 10*3/uL (ref 0.00–0.07)
Basophils Absolute: 0 10*3/uL (ref 0.0–0.1)
Basophils Relative: 0 %
Eosinophils Absolute: 0.2 10*3/uL (ref 0.0–0.5)
Eosinophils Relative: 3 %
HCT: 35 % — ABNORMAL LOW (ref 36.0–46.0)
Hemoglobin: 11.4 g/dL — ABNORMAL LOW (ref 12.0–15.0)
Immature Granulocytes: 0 %
Lymphocytes Relative: 35 %
Lymphs Abs: 2 10*3/uL (ref 0.7–4.0)
MCH: 28.6 pg (ref 26.0–34.0)
MCHC: 32.6 g/dL (ref 30.0–36.0)
MCV: 87.9 fL (ref 80.0–100.0)
Monocytes Absolute: 0.4 10*3/uL (ref 0.1–1.0)
Monocytes Relative: 7 %
Neutro Abs: 3.3 10*3/uL (ref 1.7–7.7)
Neutrophils Relative %: 55 %
Platelets: 271 10*3/uL (ref 150–400)
RBC: 3.98 MIL/uL (ref 3.87–5.11)
RDW: 14.6 % (ref 11.5–15.5)
WBC: 5.9 10*3/uL (ref 4.0–10.5)
nRBC: 0 % (ref 0.0–0.2)

## 2020-07-10 LAB — URINALYSIS, ROUTINE W REFLEX MICROSCOPIC
Bilirubin Urine: NEGATIVE
Glucose, UA: NEGATIVE mg/dL
Hgb urine dipstick: NEGATIVE
Ketones, ur: NEGATIVE mg/dL
Nitrite: NEGATIVE
Protein, ur: NEGATIVE mg/dL
Specific Gravity, Urine: <1.005 — ABNORMAL LOW (ref 1.005–1.030)
pH: 7 (ref 5.0–8.0)

## 2020-07-10 LAB — COMPREHENSIVE METABOLIC PANEL
ALT: 12 U/L (ref 0–44)
AST: 23 U/L (ref 15–41)
Albumin: 3.3 g/dL — ABNORMAL LOW (ref 3.5–5.0)
Alkaline Phosphatase: 61 U/L (ref 38–126)
Anion gap: 8 (ref 5–15)
BUN: 14 mg/dL (ref 8–23)
CO2: 29 mmol/L (ref 22–32)
Calcium: 9 mg/dL (ref 8.9–10.3)
Chloride: 103 mmol/L (ref 98–111)
Creatinine, Ser: 0.78 mg/dL (ref 0.44–1.00)
GFR, Estimated: >60 mL/min (ref >60)
Glucose, Bld: 100 mg/dL — ABNORMAL HIGH (ref 70–99)
Potassium: 3.8 mmol/L (ref 3.5–5.1)
Sodium: 140 mmol/L (ref 135–145)
Total Bilirubin: 0.5 mg/dL (ref 0.3–1.2)
Total Protein: 6.9 g/dL (ref 6.5–8.1)

## 2020-07-10 LAB — URINALYSIS, MICROSCOPIC (REFLEX)

## 2020-07-10 LAB — TROPONIN I (HIGH SENSITIVITY): Troponin I (High Sensitivity): 3 ng/L (ref <18)

## 2020-07-10 NOTE — ED Notes (Signed)
Pt ambulated to BR pulse ox 98  Percent ra and  Heart rate 86

## 2020-07-10 NOTE — Discharge Instructions (Addendum)
Your lab work and imaging all look reassuring.  I want you to continue with your medications as prescribed.  Please follow-up with your PCP on Wednesday for reevaluation.  Please talk to them about your blood pressure medication as it was slightly elevated here today.  Come back to the emergency department if you develop chest pain, shortness of breath, severe abdominal pain, uncontrolled nausea, vomiting, diarrhea.

## 2020-07-10 NOTE — ED Provider Notes (Signed)
[facility] EMERGENCY DEPARTMENT Provider Note   CSN: [id number] Arrival date & time: 07/10/20  0946     History Chief Complaint  Patient presents with  . Hypertension    Stephanie Parks is a 75 y.o. female.  HPI   Patient with significant medical history of CAD, hypertension, GERD, presents to the emergency department with chief complaint of elevated blood pressure, dizziness, left arm shaking.  Patient states patient she has felt dizzy for the last month, states it is constant, does not worsen with changing in position, she states she feels slightly off balance, is not associated with a headache, change in vision, paresthesias or weakness in the upper or lower extremities.  Patient denies recent falls, is not on anticoagulants, does not have associated fevers or chills with this.  Patient states she has been seen by her PCP and they have been unable to determine what the causes,  They think it  might be from her blood pressure medications.  Patient also endorses that she has been having some left arm shaking, states that happened suddenly today, and has  Resolved.  Patient states she has been taking her blood pressure medications but still has a elevated blood pressure, she endorses that all her medical issues started after she had COVID 1 month ago.  She is scheduled see her PCP on Wednesday for reevaluation.  Patient denies headaches, fevers, chills, shortness of breath, chest pain, abdominal pain, nausea, vomit, diarrhea, worsening pedal edema.  Past Medical History:  Diagnosis Date  . 3-vessel CAD 03/01/2015  . Achalasia 09/28/2012  . Allergic rhinitis 03/01/2015  . Anemia   . Aneurysm ([place]) 03/01/2015  . BP (high blood pressure) 03/01/2015  . Bronchitis 04/23/2018  . Cephalalgia 08/24/2012   Overview:  ICD-10 cut over    . Chest pain 04/04/2011  . CN (constipation) 09/28/2012  . Coughing 04/24/2017  . Decreased potassium in the blood 03/01/2015  . Diverticulosis   . Dizziness  03/01/2015  . GERD (gastroesophageal reflux disease)   . Hiatal hernia   . Hypercholesterolemia 03/01/2015  . Hyperlipidemia   . Hypertension   . Ingrown toenail 08/10/2017  . Inguinal hernia   . Migraine headache   . Onychomycosis 12/08/2017  . Paresthesia of arm 03/01/2015  . Schatzki's ring   . Tendonitis, Achilles, right 12/08/2017  . Unilateral primary osteoarthritis, left knee 10/07/2017    Patient Active Problem List   Diagnosis Date Noted  . Headache 06/07/2020  . Acute respiratory failure with hypoxia ([place]) 05/02/2020  . Post-COVID chronic dyspnea 05/02/2020  . Pulsatile neck mass 04/26/2020  . Pneumonia due to COVID-19 virus 03/12/2020  . Headache disorder 01/04/2020  . Seasonal allergic conjunctivitis 10/04/2019  . Dysfunction of right eustachian tube 10/04/2019  . Pre-diabetes 06/02/2019  . Seasonal and perennial allergic rhinitis 12/22/2018  . Heart palpitations 12/22/2018  . History of chest pain 07/20/2018  . Brain aneurysm 07/20/2018  . Dyslipidemia 05/04/2018  . Bronchitis 04/23/2018  . Tendonitis, Achilles, right 12/08/2017  . Onychomycosis 12/08/2017  . Unilateral primary osteoarthritis, left knee 10/07/2017  . Ingrown toenail 08/10/2017  . Coughing 04/24/2017  . CAD (coronary artery disease) 03/01/2015  . Aneurysm ([place]) 03/01/2015  . Dizziness 03/01/2015  . Paresthesia of arm 03/01/2015  . Seasonal allergic rhinitis 03/01/2015  . Achalasia 09/28/2012  . CN (constipation) 09/28/2012  . Cephalalgia 08/24/2012  . Essential hypertension 04/19/2011  . Hyperlipidemia 04/19/2011  . GERD (gastroesophageal reflux disease) 04/19/2011  . Chest pain 04/04/2011  Past Surgical History:  Procedure Laterality Date  . ABDOMINAL HYSTERECTOMY    . BACK SURGERY     x 3  . CARPAL TUNNEL RELEASE     left wrist  . CERVICAL SPINE SURGERY    . HEMORRHOID SURGERY       OB History   No obstetric history on file.     Family History  Problem Relation Age of  Onset  . Allergic rhinitis Sister   . Diabetes Sister   . Hypertension Sister   . Heart attack Father 58  . Heart disease Father   . Stomach cancer Maternal Grandmother   . Heart disease Mother   . Colon cancer Neg Hx   . Angioedema Neg Hx   . Asthma Neg Hx   . Eczema Neg Hx   . Urticaria Neg Hx   . Immunodeficiency Neg Hx   . Breast cancer Neg Hx   . Migraines Neg Hx   . Headache Neg Hx     Social History   Tobacco Use  . Smoking status: Former Smoker    Packs/day: 0.50    Years: 20.00    Pack years: 10.00    Types: Cigarettes    Quit date: 02/19/1984    Years since quitting: 36.4  . Smokeless tobacco: Never Used  Vaping Use  . Vaping Use: Never used  Substance Use Topics  . Alcohol use: No  . Drug use: No    Home Medications Prior to Admission medications   Medication Sig Start Date End Date Taking? Authorizing Provider  acetaminophen (TYLENOL) 325 MG tablet Take 650 mg by mouth. For headaches    [provider]  albuterol (PROAIR HFA) 108 (90 Base) MCG/ACT inhaler Inhale 2 puffs into the lungs every 4 (four) hours as needed for wheezing or shortness of breath. 04/04/20   Althea Charon, FNP  apixaban (ELIQUIS) 5 MG TABS tablet Take 1 tablet (5 mg total) by mouth 2 (two) times daily. 06/12/20   Copland, Gay Filler, MD  ascorbic acid (VITAMIN C) 500 MG tablet Take 500 mg by mouth daily.    [provider]  Calcium Carb-Cholecalciferol (CALCIUM 600 + D PO) Take 1 tablet by mouth daily.    [provider]  ELDERBERRY PO Take 50 mg by mouth daily.    [provider]  EPINEPHrine (EPIPEN 2-PAK) 0.3 mg/0.3 mL IJ SOAJ injection Inject 0.3 mLs (0.3 mg total) into the muscle as needed for anaphylaxis. Per allergen immunotherapy protocol 10/04/19   Dara Hoyer, FNP  esomeprazole (NEXIUM) 40 MG capsule Take 1 capsule (40 mg total) by mouth daily. 05/26/20   Shelda Pal, DO  famotidine (PEPCID) 20 MG tablet Take 1 tablet (20 mg total) by  mouth 2 (two) times daily. Patient not taking: Reported on 07/04/2020 05/24/20   Shelda Pal, DO  fluticasone [facility]) 50 MCG/ACT nasal spray USE 2 SPRAYS IN NOSTRIL(S) DAILY AS NEEDED.  In the right nostril point the applicator out toward your right ear.  In the left nostril point the applicator out toward her left ear Patient taking differently: Place 2 sprays into both nostrils daily as needed for allergies. 10/04/19   Dara Hoyer, FNP  losartan (COZAAR) 100 MG tablet Take 1 tablet (100 mg total) by mouth daily. 06/21/20   Jettie Booze, MD  metoprolol succinate (TOPROL-XL) 25 MG 24 hr tablet Take 0.5 tablets (12.5 mg total) by mouth daily. 09/06/19   Copland, Gay Filler, MD  Multiple  Vitamins-Minerals (MULTIVITAL) tablet Take 1 tablet by mouth daily.     [provider]  omeprazole (PRILOSEC) 40 MG capsule Take 40 mg by mouth daily. Patient not taking: Reported on 07/04/2020    [provider]  potassium chloride SA (KLOR-CON M20) 20 MEQ tablet TAKE 2 TABLETS ONE DAY THEN 1 TABLET THE NEXT 06/05/20   Copland, Gay Filler, MD  rizatriptan (MAXALT-MLT) 10 MG disintegrating tablet Take 10 mg by mouth as needed for migraine. May repeat in 2 hours if needed Patient not taking: No sig reported    [provider]  rosuvastatin (CRESTOR) 10 MG tablet TAKE 1 TABLET BY MOUTH EVERY DAY Patient taking differently: Take 10 mg by mouth daily. 01/11/20   Copland, Gay Filler, MD  spironolactone (ALDACTONE) 25 MG tablet Take 0.5 tablets (12.5 mg total) by mouth daily. 06/07/20   Jettie Booze, MD  zinc gluconate 50 MG tablet Take 50 mg by mouth daily.    [provider]    Allergies    Shellfish allergy, Ace inhibitors, Gabapentin, Pitavastatin, Topiramate, and Sulfa antibiotics  Review of Systems   Review of Systems  Constitutional: Negative for chills and fever.  HENT: Negative for congestion and sore throat.   Eyes: Negative for visual disturbance.   Respiratory: Negative for shortness of breath.   Cardiovascular: Positive for palpitations. Negative for chest pain.  Gastrointestinal: Negative for abdominal pain, diarrhea, nausea and vomiting.  Genitourinary: Negative for enuresis.  Musculoskeletal: Negative for back pain.  Skin: Negative for rash.  Neurological: Positive for dizziness. Negative for headaches.  Hematological: Does not bruise/bleed easily.    Physical Exam Updated Vital Signs BP 139/70   Pulse 72   Temp 98.6 F (37 C) (Oral)   Resp 14   Ht 5\' 5"  (1.651 m)   Wt 86.2 kg   SpO2 100%   BMI 31.62 kg/m   Physical Exam Vitals and nursing note reviewed.  Constitutional:      General: She is not in acute distress.    Appearance: She is not ill-appearing.  HENT:     Head: Normocephalic and atraumatic.     Nose: No congestion.     Mouth/Throat:     Mouth: Mucous membranes are moist.     Pharynx: Oropharynx is clear.  Eyes:     Conjunctiva/sclera: Conjunctivae normal.     Pupils: Pupils are equal, round, and reactive to light.  Cardiovascular:     Rate and Rhythm: Normal rate and regular rhythm.     Pulses: Normal pulses.     Heart sounds: No murmur heard. No friction rub. No gallop.   Pulmonary:     Effort: No respiratory distress.     Breath sounds: No wheezing, rhonchi or rales.  Abdominal:     General: There is no distension.     Palpations: Abdomen is soft.  Musculoskeletal:     Cervical back: Normal range of motion.     Right lower leg: No edema.     Left lower leg: No edema.     Comments: Patient has 5 of 5 strength, neurovascularly intact in the upper lower extremities.  Skin:    General: Skin is warm and dry.  Neurological:     Mental Status: She is alert.     GCS: GCS eye subscore is 4. GCS verbal subscore is 5. GCS motor subscore is 6.     Sensory: Sensation is intact.     Motor: No weakness.  Coordination: Romberg sign negative. Finger-Nose-Finger Test and Heel to [place] Test normal.      Comments: Current nerves II through XII are grossly intact, patient is operatively at word finding.   Psychiatric:        Mood and Affect: Mood normal.     ED Results / Procedures / Treatments   Labs (all labs ordered are listed, but only abnormal results are displayed) Labs Reviewed  CBC WITH DIFFERENTIAL/PLATELET - Abnormal; Notable for the following components:      Result Value   Hemoglobin 11.4 (*)    HCT 35.0 (*)    All other components within normal limits  COMPREHENSIVE METABOLIC PANEL - Abnormal; Notable for the following components:   Glucose, Bld 100 (*)    Albumin 3.3 (*)    All other components within normal limits  URINALYSIS, ROUTINE W REFLEX MICROSCOPIC - Abnormal; Notable for the following components:   Specific Gravity, Urine <1.005 (*)    Leukocytes,Ua SMALL (*)    All other components within normal limits  URINALYSIS, MICROSCOPIC (REFLEX) - Abnormal; Notable for the following components:   Bacteria, UA FEW (*)    All other components within normal limits  TROPONIN I (HIGH SENSITIVITY)    EKG EKG Interpretation  Date/Time:  Monday Jul 10 2020 10:20:14 EDT Ventricular Rate:  76 PR Interval:  188 QRS Duration: 90 QT Interval:  372 QTC Calculation: 419 R Axis:   -4 Text Interpretation: Sinus rhythm No significant change since prior 4/22 Confirmed by Aletta Edouard [phone]) on 07/10/2020 10:29:46 AM   Radiology CT Head Wo Contrast  Result Date: 07/10/2020 CLINICAL DATA:  Chronic headache. EXAM: CT HEAD WITHOUT CONTRAST TECHNIQUE: Contiguous axial images were obtained from the base of the skull through the vertex without intravenous contrast. COMPARISON:  Head CT 09/11/2019 and 04/20/2020. FINDINGS: Brain: No evidence of acute infarction, hemorrhage, hydrocephalus, extra-axial collection or mass lesion/mass effect. Mild atrophy noted. Vascular: No hyperdense vessel or unexpected calcification. Skull: Intact.  No focal lesion. Sinuses/Orbits: Negative.  Other: None. IMPRESSION: No acute abnormality. Electronically Signed   By: Inge Rise M.D.   On: 07/10/2020 11:59    Procedures Procedures   Medications Ordered in ED Medications - No data to display  ED Course  I have reviewed the triage vital signs and the nursing notes.  Pertinent labs & imaging results that were available during my care of the patient were reviewed by me and considered in my medical decision making (see chart for details).  Clinical Course as of 07/10/20 1248  Mon Jul 11, 7758  [id number] 75 year old female here with various complaints and and going on for over a month.  We did not find anything very remarkable today.  She seems frustrated by this.  Recommended she contact her primary care doctor for close follow-up. [MB]    Clinical Course User Index [MB] Hayden Rasmussen, MD   MDM Rules/Calculators/A&P                         Initial impression-patient presents with high blood pressure, dizziness, left arm shaking since resolved.  She is alert, does not appear in acute distress, vital signs show elevated blood pressure.  Will obtain basic lab work-up, CT head and reassess.  Work-up-CBC shows normocytic anemia with hemoglobin of 11.4 appears to be baseline for patient.  CMP shows slight hyperglycemia of 100, first troponin is 3.  UA shows small leukocytes, few bacteria many squamous cells.  CT  head negative for acute findings, EKG sinus without signs of ischemia.  Patient ambulated without difficulty, no elevation in heart rate or decrease in O2 sats.  Reassessment patient was reassessed, has complaints at this time, vital signs remained stable.  Patient is agreeable for discharge.  Rule out-I have low suspicion for CVA or intracranial head bleed as there is no focal deficits present my exam, CT head is negative for acute findings.  Low suspicion for systemic infection and/or meningitis as there is no leukocytosis, vital signs reassuring, no meningeal sign present  my exam.  Low suspicion for ACS as patient denies chest pain, shortness of breath, EKG without signs of ischemia, first troponin is 3, will defer checking troponin as patient has been chest pain-free for greater than 12 hours, would expect elevation in troponins if ACS was present.  Low suspicion for carotid dissection as presentation is atypical, patient had recent CT angio neck which was negative for acute findings.  No suspicion for hypertensive emergency or urgency as there is no sign of organ damage.   Plan-  1.  Hypertension uncontrolled-suspect patient will need further work-up at her PCP and readjustments of her medications.  Will defer altering medications at this time as she is not in hypertensive urgency or emergency, she is seeing her PCP on Monday for recheck.  2.  Dizziness nonspecific-suspect secondary due to high blood pressure, will recommend she follows her PCP for reevaluation.  Vital signs have remained stable, no indication for hospital admission.  Patient discussed with attending and they agreed with assessment and plan.  Patient given at home care as well strict return precautions.  Patient verbalized that they understood agreed to said plan.   Final Clinical Impression(s) / ED Diagnoses Final diagnoses:  Hypertension, unspecified type  Dizziness    Rx / DC Orders ED Discharge Orders    None       Marcello Fennel, PA-C 07/10/20 1248    Hayden Rasmussen, MD 07/10/20 [phone]

## 2020-07-10 NOTE — ED Triage Notes (Signed)
States was in the area doing errands and felt like she needed to be seen , has dr appointment on wed but feels bad today . She has had HTN for awhile but drs cannot seem to get meds right , h/a and left side chest and arm pain x 2 days

## 2020-07-11 ENCOUNTER — Telehealth: Payer: Self-pay | Admitting: Pharmacist

## 2020-07-11 ENCOUNTER — Ambulatory Visit: Payer: Medicare Other

## 2020-07-11 ENCOUNTER — Telehealth: Payer: Self-pay

## 2020-07-11 NOTE — Progress Notes (Signed)
[place] at [facility] [address] [place], [place] [address] [phone]  Date:  07/12/2020   Name:  Stephanie Parks   DOB:  [birth year]   MRN:  [id number]  PCP:  [name], MD    Chief Complaint: Follow-up (Was in th ED on 07/10/2020)   History of Present Illness:  Stephanie Parks is a 75 y.o. very pleasant female patient who presents with the following:  Follow-up visit today -last visit with myself in April Earlier this year she had [id number] pneumonia complicated by pulmonary embolism.  At her last visit she was still using oxygen at night, was going to pulmonary rehab. She is now off oxygen and is doing pulmonary rehab 3 times a week  She has also completed her 3 months of Eliquis treatment We also noticed a pulsatile mass in her right neck, we have done evaluation including ultrasound and CT scan which revealed nothing of concern-possibly more superficial carotid artery  She also went to the ER on 5/23 with concern of elevated blood pressure, dizziness, left arm shaking CT head was negative for any acute findings.  She was evaluated and released to home Her BP at home was 160/87 prior to ER visit Her blood pressure was 139/70 in the ER  BP Readings from Last 3 Encounters:  07/12/20 138/80  07/10/20 139/70  06/14/20 (!) 163/78   Losartan 100  toprol xl 25 Spiro 12.5 Recent K looked fine in ER on 5/23 She is on nortriptyline per her ENT doctor 25 once a day for her neck pain and feeling lightheaded- she started on this about 2 weeks ago So far not helping her much   The pharm D from her cardiology clinic called her last night  Called and spoke with patient regarding HTN.  Went to ER yesterday and discharged with BP of 139/70.  Patient reports her BP was higher that that and has been higher all day today.  Says she has a headache and is dizzy and lightheaded.   Currently on Toprol 12.5mg  daily, spironolactone  12.5mg  daily and losartan 100mg  daily.  Believes the medications are causing her to be dizzy and lightheaded because she read the adverse effects.  Advised that yes these medications can cause dizziness if they lower her BP too greatly but that does not seem to be the case.  Offered to increase metoprolol or spironolactone or add amlodipine but patient refuses because she does not think medications are effective since "she has felt this way for months." Unclear what BP was today.  Has appointment with PCP tomorrow.  She notes both a feeling of being lightheaded and having headaches daily for 2 months She is taking tylenol which relieves her HA temporarily  She was supposed to see her neurologist 3 weeks ago but the appt was canceled as her doctor was sick, the patient then canceled a follow-up appointment because she was "too dizzy to drive"  I had her start on busparone in March, she is apparently no longer taking this-she really does not remember how long she took this medication or why she stopped using it   Pulse Readings from Last 3 Encounters:  07/12/20 89  07/10/20 72  06/14/20 (!) 57   Wt Readings from Last 3 Encounters:  07/12/20 190 lb 12.8 oz (86.5 kg)  07/10/20 190 lb (86.2 kg)  06/14/20 188 lb (85.3 kg)   One month ago 191  Patient  Active Problem List   Diagnosis Date Noted  . Headache 06/07/2020  . Acute respiratory failure with hypoxia ([place]) 05/02/2020  . Post-COVID chronic dyspnea 05/02/2020  . Pulsatile neck mass 04/26/2020  . Pneumonia due to COVID-19 virus 03/12/2020  . Headache disorder 01/04/2020  . Seasonal allergic conjunctivitis 10/04/2019  . Dysfunction of right eustachian tube 10/04/2019  . Pre-diabetes 06/02/2019  . Seasonal and perennial allergic rhinitis 12/22/2018  . Heart palpitations 12/22/2018  . History of chest pain 07/20/2018  . Brain aneurysm 07/20/2018  . Dyslipidemia 05/04/2018  . Bronchitis 04/23/2018  . Tendonitis, Achilles, right  12/08/2017  . Onychomycosis 12/08/2017  . Unilateral primary osteoarthritis, left knee 10/07/2017  . Ingrown toenail 08/10/2017  . Coughing 04/24/2017  . CAD (coronary artery disease) 03/01/2015  . Aneurysm ([place]) 03/01/2015  . Dizziness 03/01/2015  . Paresthesia of arm 03/01/2015  . Seasonal allergic rhinitis 03/01/2015  . Achalasia 09/28/2012  . CN (constipation) 09/28/2012  . Cephalalgia 08/24/2012  . Essential hypertension 04/19/2011  . Hyperlipidemia 04/19/2011  . GERD (gastroesophageal reflux disease) 04/19/2011  . Chest pain 04/04/2011    Past Medical History:  Diagnosis Date  . 3-vessel CAD 03/01/2015  . Achalasia 09/28/2012  . Allergic rhinitis 03/01/2015  . Anemia   . Aneurysm ([place]) 03/01/2015  . BP (high blood pressure) 03/01/2015  . Bronchitis 04/23/2018  . Cephalalgia 08/24/2012   Overview:  ICD-10 cut over    . Chest pain 04/04/2011  . CN (constipation) 09/28/2012  . Coughing 04/24/2017  . Decreased potassium in the blood 03/01/2015  . Diverticulosis   . Dizziness 03/01/2015  . GERD (gastroesophageal reflux disease)   . Hiatal hernia   . Hypercholesterolemia 03/01/2015  . Hyperlipidemia   . Hypertension   . Ingrown toenail 08/10/2017  . Inguinal hernia   . Migraine headache   . Onychomycosis 12/08/2017  . Paresthesia of arm 03/01/2015  . Schatzki's ring   . Tendonitis, Achilles, right 12/08/2017  . Unilateral primary osteoarthritis, left knee 10/07/2017    Past Surgical History:  Procedure Laterality Date  . ABDOMINAL HYSTERECTOMY    . BACK SURGERY     x 3  . CARPAL TUNNEL RELEASE     left wrist  . CERVICAL SPINE SURGERY    . HEMORRHOID SURGERY      Social History   Tobacco Use  . Smoking status: Former Smoker    Packs/day: 0.50    Years: 20.00    Pack years: 10.00    Types: Cigarettes    Quit date: 02/19/1984    Years since quitting: 36.4  . Smokeless tobacco: Never Used  Vaping Use  . Vaping Use: Never used  Substance Use Topics  . Alcohol use: No   . Drug use: No    Family History  Problem Relation Age of Onset  . Allergic rhinitis Sister   . Diabetes Sister   . Hypertension Sister   . Heart attack Father 81  . Heart disease Father   . Stomach cancer Maternal Grandmother   . Heart disease Mother   . Colon cancer Neg Hx   . Angioedema Neg Hx   . Asthma Neg Hx   . Eczema Neg Hx   . Urticaria Neg Hx   . Immunodeficiency Neg Hx   . Breast cancer Neg Hx   . Migraines Neg Hx   . Headache Neg Hx     Allergies  Allergen Reactions  . Shellfish Allergy Hives and Swelling  . Ace Inhibitors Other (See Comments)  Pt cannot recall her reaction but tolerates arb   . Gabapentin Other (See Comments)    headaches  . Pitavastatin     Unknown reaction   . Topiramate     Heart race,   . Sulfa Antibiotics Other (See Comments) and Rash    Fine bumps    Medication list has been reviewed and updated.  Current Outpatient Medications on File Prior to Visit  Medication Sig Dispense Refill  . acetaminophen (TYLENOL) 325 MG tablet Take 650 mg by mouth. For headaches    . albuterol (PROAIR HFA) 108 (90 Base) MCG/ACT inhaler Inhale 2 puffs into the lungs every 4 (four) hours as needed for wheezing or shortness of breath. 1 each 1  . ascorbic acid (VITAMIN C) 500 MG tablet Take 500 mg by mouth daily.    . Calcium Carb-Cholecalciferol (CALCIUM 600 + D PO) Take 1 tablet by mouth daily.    Marland Kitchen ELDERBERRY PO Take 50 mg by mouth daily.    Marland Kitchen EPINEPHrine (EPIPEN 2-PAK) 0.3 mg/0.3 mL IJ SOAJ injection Inject 0.3 mLs (0.3 mg total) into the muscle as needed for anaphylaxis. Per allergen immunotherapy protocol 1 each 2  . esomeprazole (NEXIUM) 40 MG capsule Take 1 capsule (40 mg total) by mouth daily. 30 capsule 3  . fluticasone (FLONASE) 50 MCG/ACT nasal spray USE 2 SPRAYS IN NOSTRIL(S) DAILY AS NEEDED.  In the right nostril point the applicator out toward your right ear.  In the left nostril point the applicator out toward her left ear (Patient  taking differently: Place 2 sprays into both nostrils daily as needed for allergies.) 16 g 9  . losartan (COZAAR) 100 MG tablet Take 1 tablet (100 mg total) by mouth daily. 90 tablet 1  . metoprolol succinate (TOPROL-XL) 25 MG 24 hr tablet Take 0.5 tablets (12.5 mg total) by mouth daily. 90 tablet 1  . Multiple Vitamins-Minerals (MULTIVITAL) tablet Take 1 tablet by mouth daily.     . potassium chloride SA (KLOR-CON M20) 20 MEQ tablet TAKE 2 TABLETS ONE DAY THEN 1 TABLET THE NEXT 180 tablet 1  . rosuvastatin (CRESTOR) 10 MG tablet TAKE 1 TABLET BY MOUTH EVERY DAY (Patient taking differently: Take 10 mg by mouth daily.) 90 tablet 3  . zinc gluconate 50 MG tablet Take 50 mg by mouth daily.    Marland Kitchen apixaban (ELIQUIS) 5 MG TABS tablet Take 1 tablet (5 mg total) by mouth 2 (two) times daily. 60 tablet 1  . famotidine (PEPCID) 20 MG tablet Take 1 tablet (20 mg total) by mouth 2 (two) times daily. (Patient not taking: Reported on 07/04/2020) 60 tablet 2  . omeprazole (PRILOSEC) 40 MG capsule Take 40 mg by mouth daily. (Patient not taking: Reported on 07/04/2020)    . rizatriptan (MAXALT-MLT) 10 MG disintegrating tablet Take 10 mg by mouth as needed for migraine. May repeat in 2 hours if needed (Patient not taking: No sig reported)    . spironolactone (ALDACTONE) 25 MG tablet Take 0.5 tablets (12.5 mg total) by mouth daily. 15 tablet 2   No current facility-administered medications on file prior to visit.    Review of Systems:  As per HPI- otherwise negative.   Physical Examination: Vitals:   07/12/20 1015 07/12/20 1048  BP: (!) 151/74 138/80  Pulse: 89   Temp: 98.8 F (37.1 C)   SpO2: 98%    Vitals:   07/12/20 1015  Weight: 190 lb 12.8 oz (86.5 kg)  Height: 5\' 5"  (1.651 m)  Body mass index is 31.75 kg/m. Ideal Body Weight: Weight in (lb) to have BMI = 25: 149.9  GEN: no acute distress.  Overweight, appears her normal self HEENT: Atraumatic, Normocephalic.  Ears and Nose: No external  deformity. CV: RRR, No M/G/R. No JVD. No thrill. No extra heart sounds. PULM: CTA B, no wheezes, crackles, rhonchi. No retractions. No resp. distress. No accessory muscle use. ABD: S, NT, ND, +BS. No rebound. No HSM. EXTR: No c/c/e PSYCH: Normally interactive. Conversant.  We had planned to do a greater trochanteric bursa injection when she finished her anticoagulant.  She continues to have pain over the left greater trochanter.  I offered to do her injection today, was going over routine preprocedure discussion-small risk of infection-and she decided not to do procedure  Assessment and Plan: Essential hypertension  GAD (generalized anxiety disorder) - Plan: busPIRone (BUSPAR) 7.5 MG tablet  Patient in today for planned follow-up, also happens to coincide with recent ER visit.  As has occurred many times, she checked her blood pressure and found it to be elevated, this caused her to be concerned and she was seen in the emergency department, evaluated and released to home. We are coming to suspect that some of her symptoms may be anxiety driven Today I advised the patient that a systolic blood pressure of 160-170 is not acutely dangerous.  If she sees this and is concerned she may certainly call me and I am glad to speak with her.  However, an ER evaluation is not required for blood pressures in this range I encouraged her to stop checking her blood pressure at home, this seems to cause significant anxiety  She notes persistent headaches, has not followed up with her neurologist.  I encouraged her to please follow-up with neurology about her headaches  We will also start her back on buspirone for anxiety-7.5 mg twice a day  I asked her to see me in 3 weeks for follow-up This visit occurred during the SARS-CoV-2 public health emergency.  Safety protocols were in place, including screening questions prior to the visit, additional usage of staff PPE, and extensive cleaning of exam room while  observing appropriate contact time as indicated for disinfecting solutions.    Signed Lamar Blinks, MD

## 2020-07-11 NOTE — Telephone Encounter (Signed)
Nurse Assessment Nurse: Ronnald Ramp, RN, Miranda Date/Time (Eastern Time): 07/10/2020 9:01:49 AM Confirm and document reason for call. If symptomatic, describe symptoms. ---Caller states 2 weeks, her doctor changed her from 1 BP medications to [facility] because she was having dizziness/lightheaded and rising BP. he is still having the same symptoms. Today her BP 160/85 HR 81, this is 1 hr after taking her medication. Does the patient have any new or worsening symptoms? ---Yes Will a triage be completed? ---Yes Related visit to physician within the last 2 weeks? ---Yes Does the PT have any chronic conditions? (i.e. diabetes, asthma, this includes High risk factors for pregnancy, etc.) ---Yes List chronic conditions. ---HTN, Allergies, Heart Is this a behavioral health or substance abuse call? ---No Guidelines Guideline Title Affirmed Question Affirmed Notes Nurse Date/Time (Eastern Time) Blood Pressure - High [1] Weakness of the face, arm or leg on one side of the body AND [2] new-onset Seat, RN, Miranda 07/10/2020 9:05:08 AM PLEASE NOTE: All timestamps contained within this report are represented as [place] Standard Time. CONFIDENTIALTY NOTICE: This fax transmission is intended only for the addressee. It contains information that is legally privileged, confidential or otherwise protected from use or disclosure. If you are not the intended recipient, you are strictly prohibited from reviewing, disclosing, copying using or disseminating any of this information or taking any action in reliance on or regarding this information. If you have received this fax in error, please notify us immediately by telephone so that we can arrange for its return to [place]. Phone: [phone], Toll-Free: [phone], Fax: [phone] Page: 2 of 2 Call Id: [id number] [place]. Time Eilene Ghazi Time) Disposition Final User 07/10/2020 9:10:42 AM 911 Outcome Documentation Ronnald Ramp, RN, Miranda Reason: Refused to call 911.  She initially said she would drive herself to the ED. She did listen when RN said that was unsafe and said she would call her son. RN reinforced EMS recommendation. 07/10/2020 9:07:40 AM Call EMS 911 Now Yes Ronnald Ramp, RN, Judge Stall Disagree/Comply Disagree Caller Understands Yes PreDisposition Call Doctor Care Advice Given Per Guideline CALL EMS 911 NOW: * Immediate medical attention is needed. You need to hang up and call 911 (or an ambulance). * Triager Discretion: I'll call you back in a few minutes to be sure you were able to reach them. CARE ADVICE given per High Blood Pressure (Adult) guideline. Referrals GO TO FACILITY UNDECIDED   Pt seen at ED.

## 2020-07-11 NOTE — Telephone Encounter (Signed)
Called and spoke with patient regarding HTN.  Went to ER yesterday and discharged with BP of 139/70.  Patient reports her BP was higher that that and has been higher all day today.  Says she has a headache and is dizzy and lightheaded.    Currently on Toprol 12.5mg  daily, spironolactone 12.5mg  daily and losartan 100mg  daily.  Believes the medications are causing her to be dizzy and lightheaded because she read the adverse effects.  Advised that yes these medications can cause dizziness if they lower her BP too greatly but that does not seem to be the case.  Offered to increase metoprolol or spironolactone or add amlodipine but patient refuses because she does not think medications are effective since "she has felt this way for months." Unclear what BP was today.  Has appointment with PCP tomorrow.

## 2020-07-11 NOTE — Telephone Encounter (Signed)
Patient called and left a VM for [place] to call back. It appears Stephanie Parks has been following. Will forward message to him for follow up.

## 2020-07-11 NOTE — Patient Instructions (Addendum)
Good to see you again today Your BP looks fine, and your labs from the ER are normal Remember, a systolic BP number of 391 is not acutely dangerous!  This might mean you need a medication adjustment but does not mean you need to go to the ER I would suggest that you not check your BP at home as it seems to cause excessive anxiety  Please start back on buspirone 7.5 mg twice a day for anxiety Please do reschedule with your neurologist to discuss your headaches  Ok to use voltaren gel on your left hip  Please see me in 3 weeks to check on how you are doing

## 2020-07-12 ENCOUNTER — Other Ambulatory Visit: Payer: Self-pay

## 2020-07-12 ENCOUNTER — Encounter: Payer: Self-pay | Admitting: Family Medicine

## 2020-07-12 ENCOUNTER — Ambulatory Visit (INDEPENDENT_AMBULATORY_CARE_PROVIDER_SITE_OTHER): Payer: Medicare Other | Admitting: Family Medicine

## 2020-07-12 VITALS — BP 138/80 | HR 89 | Temp 98.8°F | Ht 65.0 in | Wt 190.8 lb

## 2020-07-12 DIAGNOSIS — F411 Generalized anxiety disorder: Secondary | ICD-10-CM

## 2020-07-12 DIAGNOSIS — I10 Essential (primary) hypertension: Secondary | ICD-10-CM | POA: Diagnosis not present

## 2020-07-12 MED ORDER — BUSPIRONE HCL 7.5 MG PO TABS: 7.5000 mg | ORAL_TABLET | Freq: Two times a day (BID) | ORAL | 3 refills | Status: DC

## 2020-07-13 DIAGNOSIS — U099 Post covid-19 condition, unspecified: Secondary | ICD-10-CM | POA: Diagnosis not present

## 2020-07-13 DIAGNOSIS — J9601 Acute respiratory failure with hypoxia: Secondary | ICD-10-CM | POA: Diagnosis not present

## 2020-07-18 ENCOUNTER — Ambulatory Visit (INDEPENDENT_AMBULATORY_CARE_PROVIDER_SITE_OTHER): Payer: Medicare Other

## 2020-07-18 DIAGNOSIS — J309 Allergic rhinitis, unspecified: Secondary | ICD-10-CM | POA: Diagnosis not present

## 2020-07-19 DIAGNOSIS — J9601 Acute respiratory failure with hypoxia: Secondary | ICD-10-CM | POA: Diagnosis not present

## 2020-07-19 DIAGNOSIS — U099 Post covid-19 condition, unspecified: Secondary | ICD-10-CM | POA: Diagnosis not present

## 2020-07-20 DIAGNOSIS — U099 Post covid-19 condition, unspecified: Secondary | ICD-10-CM | POA: Diagnosis not present

## 2020-07-20 DIAGNOSIS — J9601 Acute respiratory failure with hypoxia: Secondary | ICD-10-CM | POA: Diagnosis not present

## 2020-07-24 ENCOUNTER — Emergency Department (HOSPITAL_COMMUNITY)
Admission: EM | Admit: 2020-07-24 | Discharge: 2020-07-24 | Disposition: A | Discharge status: Home / Self Care | Payer: Medicare Other | Attending: Emergency Medicine | Admitting: Emergency Medicine

## 2020-07-24 ENCOUNTER — Telehealth: Payer: Self-pay | Admitting: Internal Medicine

## 2020-07-24 ENCOUNTER — Emergency Department (HOSPITAL_COMMUNITY): Payer: Medicare Other

## 2020-07-24 ENCOUNTER — Encounter (HOSPITAL_COMMUNITY): Payer: Self-pay | Admitting: Emergency Medicine

## 2020-07-24 ENCOUNTER — Other Ambulatory Visit: Payer: Self-pay

## 2020-07-24 DIAGNOSIS — I10 Essential (primary) hypertension: Secondary | ICD-10-CM | POA: Insufficient documentation

## 2020-07-24 DIAGNOSIS — R42 Dizziness and giddiness: Secondary | ICD-10-CM | POA: Diagnosis not present

## 2020-07-24 DIAGNOSIS — I2510 Atherosclerotic heart disease of native coronary artery without angina pectoris: Secondary | ICD-10-CM | POA: Insufficient documentation

## 2020-07-24 DIAGNOSIS — R61 Generalized hyperhidrosis: Secondary | ICD-10-CM | POA: Insufficient documentation

## 2020-07-24 DIAGNOSIS — Z7901 Long term (current) use of anticoagulants: Secondary | ICD-10-CM | POA: Insufficient documentation

## 2020-07-24 DIAGNOSIS — Z8616 Personal history of COVID-19: Secondary | ICD-10-CM | POA: Diagnosis not present

## 2020-07-24 DIAGNOSIS — R0602 Shortness of breath: Secondary | ICD-10-CM | POA: Insufficient documentation

## 2020-07-24 DIAGNOSIS — R002 Palpitations: Secondary | ICD-10-CM | POA: Diagnosis not present

## 2020-07-24 DIAGNOSIS — Z79899 Other long term (current) drug therapy: Secondary | ICD-10-CM | POA: Diagnosis not present

## 2020-07-24 DIAGNOSIS — R519 Headache, unspecified: Secondary | ICD-10-CM | POA: Insufficient documentation

## 2020-07-24 DIAGNOSIS — Z87891 Personal history of nicotine dependence: Secondary | ICD-10-CM | POA: Diagnosis not present

## 2020-07-24 DIAGNOSIS — M542 Cervicalgia: Secondary | ICD-10-CM | POA: Diagnosis not present

## 2020-07-24 DIAGNOSIS — R059 Cough, unspecified: Secondary | ICD-10-CM | POA: Insufficient documentation

## 2020-07-24 DIAGNOSIS — M5013 Cervical disc disorder with radiculopathy, cervicothoracic region: Secondary | ICD-10-CM | POA: Diagnosis not present

## 2020-07-24 DIAGNOSIS — S134XXA Sprain of ligaments of cervical spine, initial encounter: Secondary | ICD-10-CM | POA: Diagnosis not present

## 2020-07-24 DIAGNOSIS — M9901 Segmental and somatic dysfunction of cervical region: Secondary | ICD-10-CM | POA: Diagnosis not present

## 2020-07-24 LAB — CBC
HCT: 34.5 % — ABNORMAL LOW (ref 36.0–46.0)
Hemoglobin: 11.2 g/dL — ABNORMAL LOW (ref 12.0–15.0)
MCH: 28.8 pg (ref 26.0–34.0)
MCHC: 32.5 g/dL (ref 30.0–36.0)
MCV: 88.7 fL (ref 80.0–100.0)
Platelets: 311 10*3/uL (ref 150–400)
RBC: 3.89 MIL/uL (ref 3.87–5.11)
RDW: 14.6 % (ref 11.5–15.5)
WBC: 6.7 10*3/uL (ref 4.0–10.5)
nRBC: 0 % (ref 0.0–0.2)

## 2020-07-24 LAB — DIFFERENTIAL
Abs Immature Granulocytes: 0.01 10*3/uL (ref 0.00–0.07)
Basophils Absolute: 0 10*3/uL (ref 0.0–0.1)
Basophils Relative: 0 %
Eosinophils Absolute: 0.3 10*3/uL (ref 0.0–0.5)
Eosinophils Relative: 5 %
Immature Granulocytes: 0 %
Lymphocytes Relative: 42 %
Lymphs Abs: 2.8 10*3/uL (ref 0.7–4.0)
Monocytes Absolute: 0.5 10*3/uL (ref 0.1–1.0)
Monocytes Relative: 7 %
Neutro Abs: 3.1 10*3/uL (ref 1.7–7.7)
Neutrophils Relative %: 46 %

## 2020-07-24 LAB — RAPID URINE DRUG SCREEN, HOSP PERFORMED
Amphetamines: NOT DETECTED
Barbiturates: NOT DETECTED
Benzodiazepines: NOT DETECTED
Cocaine: NOT DETECTED
Opiates: NOT DETECTED
Tetrahydrocannabinol: NOT DETECTED

## 2020-07-24 LAB — URINALYSIS, ROUTINE W REFLEX MICROSCOPIC
Bilirubin Urine: NEGATIVE
Glucose, UA: NEGATIVE mg/dL
Hgb urine dipstick: NEGATIVE
Ketones, ur: NEGATIVE mg/dL
Leukocytes,Ua: NEGATIVE
Nitrite: NEGATIVE
Protein, ur: NEGATIVE mg/dL
Specific Gravity, Urine: 1.003 — ABNORMAL LOW (ref 1.005–1.030)
pH: 7 (ref 5.0–8.0)

## 2020-07-24 LAB — COMPREHENSIVE METABOLIC PANEL
ALT: 12 U/L (ref 0–44)
AST: 19 U/L (ref 15–41)
Albumin: 3.3 g/dL — ABNORMAL LOW (ref 3.5–5.0)
Alkaline Phosphatase: 59 U/L (ref 38–126)
Anion gap: 8 (ref 5–15)
BUN: 12 mg/dL (ref 8–23)
CO2: 27 mmol/L (ref 22–32)
Calcium: 9.2 mg/dL (ref 8.9–10.3)
Chloride: 105 mmol/L (ref 98–111)
Creatinine, Ser: 0.77 mg/dL (ref 0.44–1.00)
GFR, Estimated: >60 mL/min (ref >60)
Glucose, Bld: 105 mg/dL — ABNORMAL HIGH (ref 70–99)
Potassium: 3.1 mmol/L — ABNORMAL LOW (ref 3.5–5.1)
Sodium: 140 mmol/L (ref 135–145)
Total Bilirubin: 0.4 mg/dL (ref 0.3–1.2)
Total Protein: 6.6 g/dL (ref 6.5–8.1)

## 2020-07-24 LAB — I-STAT CHEM 8, ED
BUN: 15 mg/dL (ref 8–23)
Calcium, Ion: 1.18 mmol/L (ref 1.15–1.40)
Chloride: 103 mmol/L (ref 98–111)
Creatinine, Ser: 0.7 mg/dL (ref 0.44–1.00)
Glucose, Bld: 104 mg/dL — ABNORMAL HIGH (ref 70–99)
HCT: 34 % — ABNORMAL LOW (ref 36.0–46.0)
Hemoglobin: 11.6 g/dL — ABNORMAL LOW (ref 12.0–15.0)
Potassium: 3.1 mmol/L — ABNORMAL LOW (ref 3.5–5.1)
Sodium: 142 mmol/L (ref 135–145)
TCO2: 27 mmol/L (ref 22–32)

## 2020-07-24 LAB — PROTIME-INR
INR: 1 (ref 0.8–1.2)
Prothrombin Time: 13.1 seconds (ref 11.4–15.2)

## 2020-07-24 LAB — ETHANOL: Alcohol, Ethyl (B): <10 mg/dL (ref <10)

## 2020-07-24 LAB — TROPONIN I (HIGH SENSITIVITY)
Troponin I (High Sensitivity): 7 ng/L (ref <18)
Troponin I (High Sensitivity): 6 ng/L (ref <18)

## 2020-07-24 LAB — APTT: aPTT: 31 seconds (ref 24–36)

## 2020-07-24 MED ORDER — POTASSIUM CHLORIDE CRYS ER 20 MEQ PO TBCR
40.0000 meq | EXTENDED_RELEASE_TABLET | Freq: Once | ORAL | Status: AC
  Administered 2020-07-24: 40 meq via ORAL
  Filled 2020-07-24: qty 2

## 2020-07-24 MED ORDER — MAGNESIUM OXIDE -MG SUPPLEMENT 400 (240 MG) MG PO TABS
800.0000 mg | ORAL_TABLET | Freq: Once | ORAL | Status: AC
  Administered 2020-07-24: 800 mg via ORAL
  Filled 2020-07-24: qty 2

## 2020-07-24 NOTE — Telephone Encounter (Signed)
This seems "triage-y" to me 

## 2020-07-24 NOTE — Telephone Encounter (Signed)
Inbound call from patient with complaints about hernia in pain, bloating, stomach pains. Would like to discuss different options for medication that she can take. Patient would like a call back at [phone]

## 2020-07-24 NOTE — Discharge Instructions (Signed)
Follow up with your doctor. Try to increase your dietary potassium.  Buy magnesium supplements and try taking them a couple times a day.

## 2020-07-24 NOTE — ED Provider Notes (Signed)
[facility] EMERGENCY DEPARTMENT Provider Note   CSN: [id number] Arrival date & time: 07/24/20  0144     History Chief Complaint  Patient presents with  . Numbness  . Tachycardia    Stephanie Parks is a 75 y.o. female.  32 yo F with a cc of palpitations.  Going off and on for months.  Patient feels similar from what she did previously.  Woke up in the middle the night sentences felt suddenly short of breath and felt sweaty and felt like her fingers were tingling.  Symptoms resolved after she thinks about an hour.  She is frustrated for multiple ED visits with similar presentation.  Has been hospitalized previously for the same.  Has had a Holter monitor where she told me that was reported as normal.  The history is provided by the patient.  Illness Severity:  Moderate Onset quality:  Gradual Duration:  2 days Timing:  Constant Progression:  Worsening Chronicity:  New Associated symptoms: cough   Associated symptoms: no chest pain, no congestion, no fever, no headaches, no myalgias, no nausea, no rhinorrhea, no shortness of breath, no vomiting and no wheezing        Past Medical History:  Diagnosis Date  . 3-vessel CAD 03/01/2015  . Achalasia 09/28/2012  . Allergic rhinitis 03/01/2015  . Anemia   . Aneurysm ([place]) 03/01/2015  . BP (high blood pressure) 03/01/2015  . Bronchitis 04/23/2018  . Cephalalgia 08/24/2012   Overview:  ICD-10 cut over    . Chest pain 04/04/2011  . CN (constipation) 09/28/2012  . Coughing 04/24/2017  . Decreased potassium in the blood 03/01/2015  . Diverticulosis   . Dizziness 03/01/2015  . GERD (gastroesophageal reflux disease)   . Hiatal hernia   . Hypercholesterolemia 03/01/2015  . Hyperlipidemia   . Hypertension   . Ingrown toenail 08/10/2017  . Inguinal hernia   . Migraine headache   . Onychomycosis 12/08/2017  . Paresthesia of arm 03/01/2015  . Schatzki's ring   . Tendonitis, Achilles, right 12/08/2017  . Unilateral primary  osteoarthritis, left knee 10/07/2017    Patient Active Problem List   Diagnosis Date Noted  . Headache 06/07/2020  . Acute respiratory failure with hypoxia ([place]) 05/02/2020  . Post-COVID chronic dyspnea 05/02/2020  . Pulsatile neck mass 04/26/2020  . Pneumonia due to COVID-19 virus 03/12/2020  . Headache disorder 01/04/2020  . Seasonal allergic conjunctivitis 10/04/2019  . Dysfunction of right eustachian tube 10/04/2019  . Pre-diabetes 06/02/2019  . Seasonal and perennial allergic rhinitis 12/22/2018  . Heart palpitations 12/22/2018  . History of chest pain 07/20/2018  . Brain aneurysm 07/20/2018  . Dyslipidemia 05/04/2018  . Bronchitis 04/23/2018  . Tendonitis, Achilles, right 12/08/2017  . Onychomycosis 12/08/2017  . Unilateral primary osteoarthritis, left knee 10/07/2017  . Ingrown toenail 08/10/2017  . Coughing 04/24/2017  . CAD (coronary artery disease) 03/01/2015  . Aneurysm ([place]) 03/01/2015  . Dizziness 03/01/2015  . Paresthesia of arm 03/01/2015  . Seasonal allergic rhinitis 03/01/2015  . Achalasia 09/28/2012  . CN (constipation) 09/28/2012  . Cephalalgia 08/24/2012  . Essential hypertension 04/19/2011  . Hyperlipidemia 04/19/2011  . GERD (gastroesophageal reflux disease) 04/19/2011  . Chest pain 04/04/2011    Past Surgical History:  Procedure Laterality Date  . ABDOMINAL HYSTERECTOMY    . BACK SURGERY     x 3  . CARPAL TUNNEL RELEASE     left wrist  . CERVICAL SPINE SURGERY    . HEMORRHOID SURGERY  OB History   No obstetric history on file.     Family History  Problem Relation Age of Onset  . Allergic rhinitis Sister   . Diabetes Sister   . Hypertension Sister   . Heart attack Father 69  . Heart disease Father   . Stomach cancer Maternal Grandmother   . Heart disease Mother   . Colon cancer Neg Hx   . Angioedema Neg Hx   . Asthma Neg Hx   . Eczema Neg Hx   . Urticaria Neg Hx   . Immunodeficiency Neg Hx   . Breast cancer Neg Hx   .  Migraines Neg Hx   . Headache Neg Hx     Social History   Tobacco Use  . Smoking status: Former Smoker    Packs/day: 0.50    Years: 20.00    Pack years: 10.00    Types: Cigarettes    Quit date: 02/19/1984    Years since quitting: 36.4  . Smokeless tobacco: Never Used  Vaping Use  . Vaping Use: Never used  Substance Use Topics  . Alcohol use: No  . Drug use: No    Home Medications Prior to Admission medications   Medication Sig Start Date End Date Taking? Authorizing Provider  acetaminophen (TYLENOL) 325 MG tablet Take 650 mg by mouth. For headaches    [provider]  albuterol (PROAIR HFA) 108 (90 Base) MCG/ACT inhaler Inhale 2 puffs into the lungs every 4 (four) hours as needed for wheezing or shortness of breath. 04/04/20   Althea Charon, FNP  apixaban (ELIQUIS) 5 MG TABS tablet Take 1 tablet (5 mg total) by mouth 2 (two) times daily. 06/12/20   Copland, Gay Filler, MD  ascorbic acid (VITAMIN C) 500 MG tablet Take 500 mg by mouth daily.    [provider]  busPIRone (BUSPAR) 7.5 MG tablet Take 1 tablet (7.5 mg total) by mouth 2 (two) times daily. 07/12/20   Copland, Gay Filler, MD  Calcium Carb-Cholecalciferol (CALCIUM 600 + D PO) Take 1 tablet by mouth daily.    [provider]  ELDERBERRY PO Take 50 mg by mouth daily.    [provider]  EPINEPHrine (EPIPEN 2-PAK) 0.3 mg/0.3 mL IJ SOAJ injection Inject 0.3 mLs (0.3 mg total) into the muscle as needed for anaphylaxis. Per allergen immunotherapy protocol 10/04/19   Dara Hoyer, FNP  esomeprazole (NEXIUM) 40 MG capsule Take 1 capsule (40 mg total) by mouth daily. 05/26/20   Shelda Pal, DO  famotidine (PEPCID) 20 MG tablet Take 1 tablet (20 mg total) by mouth 2 (two) times daily. Patient not taking: Reported on 07/04/2020 05/24/20   Shelda Pal, DO  fluticasone [facility]) 50 MCG/ACT nasal spray USE 2 SPRAYS IN NOSTRIL(S) DAILY AS NEEDED.  In the right nostril point the applicator  out toward your right ear.  In the left nostril point the applicator out toward her left ear Patient taking differently: Place 2 sprays into both nostrils daily as needed for allergies. 10/04/19   Dara Hoyer, FNP  losartan (COZAAR) 100 MG tablet Take 1 tablet (100 mg total) by mouth daily. 06/21/20   Jettie Booze, MD  metoprolol succinate (TOPROL-XL) 25 MG 24 hr tablet Take 0.5 tablets (12.5 mg total) by mouth daily. 09/06/19   Copland, Gay Filler, MD  Multiple Vitamins-Minerals (MULTIVITAL) tablet Take 1 tablet by mouth daily.     [provider]  omeprazole (PRILOSEC) 40 MG capsule Take 40 mg by  mouth daily. Patient not taking: Reported on 07/04/2020    [provider]  potassium chloride SA (KLOR-CON M20) 20 MEQ tablet TAKE 2 TABLETS ONE DAY THEN 1 TABLET THE NEXT 06/05/20   Copland, Gay Filler, MD  rizatriptan (MAXALT-MLT) 10 MG disintegrating tablet Take 10 mg by mouth as needed for migraine. May repeat in 2 hours if needed Patient not taking: No sig reported    [provider]  rosuvastatin (CRESTOR) 10 MG tablet TAKE 1 TABLET BY MOUTH EVERY DAY Patient taking differently: Take 10 mg by mouth daily. 01/11/20   Copland, Gay Filler, MD  spironolactone (ALDACTONE) 25 MG tablet Take 0.5 tablets (12.5 mg total) by mouth daily. 06/07/20   Jettie Booze, MD  zinc gluconate 50 MG tablet Take 50 mg by mouth daily.    [provider]    Allergies    Shellfish allergy, Ace inhibitors, Gabapentin, Pitavastatin, Topiramate, and Sulfa antibiotics  Review of Systems   Review of Systems  Constitutional: Positive for diaphoresis. Negative for chills and fever.  HENT: Negative for congestion and rhinorrhea.   Eyes: Negative for redness and visual disturbance.  Respiratory: Positive for cough. Negative for shortness of breath and wheezing.   Cardiovascular: Positive for palpitations. Negative for chest pain.  Gastrointestinal: Negative for nausea and vomiting.   Genitourinary: Negative for dysuria and urgency.  Musculoskeletal: Negative for arthralgias and myalgias.  Skin: Negative for pallor and wound.  Neurological: Negative for dizziness and headaches.    Physical Exam Updated Vital Signs BP (!) 156/70   Pulse (!) 53   Temp 98 F (36.7 C) (Oral)   Resp 18   SpO2 100%   Physical Exam Vitals and nursing note reviewed.  Constitutional:      General: She is not in acute distress.    Appearance: She is well-developed. She is not diaphoretic.  HENT:     Head: Normocephalic and atraumatic.  Eyes:     Pupils: Pupils are equal, round, and reactive to light.  Cardiovascular:     Rate and Rhythm: Normal rate and regular rhythm.     Heart sounds: No murmur heard. No friction rub. No gallop.   Pulmonary:     Effort: Pulmonary effort is normal.     Breath sounds: No wheezing or rales.  Abdominal:     General: There is no distension.     Palpations: Abdomen is soft.     Tenderness: There is no abdominal tenderness.  Musculoskeletal:        General: No tenderness.     Cervical back: Normal range of motion and neck supple.  Skin:    General: Skin is warm and dry.  Neurological:     Mental Status: She is alert and oriented to person, place, and time.  Psychiatric:        Behavior: Behavior normal.     ED Results / Procedures / Treatments   Labs (all labs ordered are listed, but only abnormal results are displayed) Labs Reviewed  CBC - Abnormal; Notable for the following components:      Result Value   Hemoglobin 11.2 (*)    HCT 34.5 (*)    All other components within normal limits  COMPREHENSIVE METABOLIC PANEL - Abnormal; Notable for the following components:   Potassium 3.1 (*)    Glucose, Bld 105 (*)    Albumin 3.3 (*)    All other components within normal limits  URINALYSIS, ROUTINE W REFLEX MICROSCOPIC - Abnormal; Notable for  the following components:   Color, Urine COLORLESS (*)    Specific Gravity, Urine 1.003 (*)     All other components within normal limits  I-STAT CHEM 8, ED - Abnormal; Notable for the following components:   Potassium 3.1 (*)    Glucose, Bld 104 (*)    Hemoglobin 11.6 (*)    HCT 34.0 (*)    All other components within normal limits  RESP PANEL BY RT-PCR (FLU A&B, COVID) ARPGX2  ETHANOL  PROTIME-INR  APTT  DIFFERENTIAL  RAPID URINE DRUG SCREEN, HOSP PERFORMED  TROPONIN I (HIGH SENSITIVITY)  TROPONIN I (HIGH SENSITIVITY)    EKG EKG Interpretation  Date/Time:  Monday July 24 2020 02:19:12 EDT Ventricular Rate:  69 PR Interval:  188 QRS Duration: 88 QT Interval:  412 QTC Calculation: 441 R Axis:   0 Text Interpretation: Normal sinus rhythm Normal ECG No significant change since last tracing Confirmed by Deno Etienne [phone]) on 07/24/2020 3:35:21 AM   Radiology CT HEAD WO CONTRAST  Result Date: 07/24/2020 CLINICAL DATA:  Dizziness EXAM: CT HEAD WITHOUT CONTRAST TECHNIQUE: Contiguous axial images were obtained from the base of the skull through the vertex without intravenous contrast. COMPARISON:  07/10/2020 FINDINGS: Brain: There is no mass, hemorrhage or extra-axial collection. The size and configuration of the ventricles and extra-axial CSF spaces are normal. The brain parenchyma is normal, without acute or chronic infarction. Vascular: No abnormal hyperdensity of the major intracranial arteries or dural venous sinuses. No intracranial atherosclerosis. Skull: The visualized skull base, calvarium and extracranial soft tissues are normal. Sinuses/Orbits: No fluid levels or advanced mucosal thickening of the visualized paranasal sinuses. No mastoid or middle ear effusion. The orbits are normal. IMPRESSION: Normal head CT. Electronically Signed   By: Ulyses Jarred M.D.   On: 07/24/2020 03:26    Procedures Procedures   Medications Ordered in ED Medications  potassium chloride SA (KLOR-CON) CR tablet 40 mEq (40 mEq Oral Given 07/24/20 0459)  magnesium oxide (MAG-OX) tablet 800 mg  (800 mg Oral Given 07/24/20 0459)    ED Course  I have reviewed the triage vital signs and the nursing notes.  Pertinent labs & imaging results that were available during my care of the patient were reviewed by me and considered in my medical decision making (see chart for details).    MDM Rules/Calculators/A&P                          75 yo F with a chief complaints of palpitations.  This has been an off-and-on issue for her.  She had a few episodes this evening, and when it woke her up from sleep and made her acutely worried she had shortness of breath and some chest discomfort hence feeling like her hands were numb.  This has now resolved.  She tells me she has had multiple visits that have been somewhat similar.  No concerning finding in her work-up.  Had a CT scan of the head that was negative.  Blood work without significant finding.  She is hypokalemic and has been noted to be hypokalemic at multiple visits previously.  When I discussed this with her she did tell me that she has had some trouble keeping her potassium level up.  Perhaps this is why she is having frequent PACs and PVCs.  We will have her try and increase potassium in her diet as she is already getting supplementation.  We will have her supplement magnesium as  well.  PCP and cardiology follow-up.   5:10 AM:  I have discussed the diagnosis/risks/treatment options with the patient and believe the pt to be eligible for discharge home to follow-up with PCP. We also discussed returning to the ED immediately if new or worsening sx occur. We discussed the sx which are most concerning (e.g., sudden worsening pain, fever, inability to tolerate by mouth) that necessitate immediate return. Medications administered to the patient during their visit and any new prescriptions provided to the patient are listed below.  Medications given during this visit Medications  potassium chloride SA (KLOR-CON) CR tablet 40 mEq (40 mEq Oral Given 07/24/20  0459)  magnesium oxide (MAG-OX) tablet 800 mg (800 mg Oral Given 07/24/20 0459)     The patient appears reasonably screen and/or stabilized for discharge and I doubt any other medical condition or other [facility] requiring further screening, evaluation, or treatment in the ED at this time prior to discharge.    Final Clinical Impression(s) / ED Diagnoses Final diagnoses:  Palpitations    Rx / DC Orders ED Discharge Orders    None       Deno Etienne, DO 07/24/20 0510

## 2020-07-24 NOTE — ED Notes (Signed)
Patient verbalizes understanding of discharge instructions. Opportunity for questioning and answers were provided. Armband removed by staff, pt discharged from ED via wheelchair.  

## 2020-07-24 NOTE — ED Triage Notes (Addendum)
Pt reports heart racing and headache started at 6pm and numbness to left arm at 8pm.  Pt reports pain to right neck Reports dizziness tonight about 10pm.  No other neuro deficits at this time.

## 2020-07-24 NOTE — ED Provider Notes (Signed)
Emergency Medicine Provider Triage Evaluation Note  Stephanie Parks , a 75 y.o. female  was evaluated in triage.  Pt complains of headache and heart racing since 6pm tonight.  Pt took tylenol without relief.  At 8pm pt developed left arm "numbness" and dizziness.  Denies diaphoresis, nausea, syncope or near syncope.  No recent illnesses. Hx of HTN - pt reports compliance with meds.  No Hx of DM, CVA/TIA.    Review of Systems  Positive: Palpitations, dizziness, headache, "numbness" Negative: Syncope, chest pain, SOB, abd apin  Physical Exam  BP (!) 185/69 (BP Location: Left Arm)   Pulse 67   Temp 97.8 F (36.6 C) (Oral)   Resp 20   SpO2 100%  Gen:   Awake, no distress   Resp:  Normal effort  MSK:   Moves extremities without difficulty  Other:    Mental Status:  Alert, oriented, thought content appropriate, able to give a coherent history. Speech fluent without evidence of aphasia. Able to follow 2 step commands without difficulty.  Cranial Nerves:  II:  Peripheral visual fields grossly normal, pupils equal, round, reactive to light III,IV, VI: ptosis not present, extra-ocular motions intact bilaterally  V,VII: smile symmetric, facial light touch sensation equal VIII: hearing grossly normal to voice  X: uvula elevates symmetrically  XI: bilateral shoulder shrug symmetric and strong XII: midline tongue extension without fassiculations Motor:  Normal tone. 5/5 in upper and lower extremities bilaterally including strong and equal grip strength and dorsiflexion/plantar flexion Sensory: light touch normal in all extremities.  Cerebellar: normal finger-to-nose with bilateral upper extremities CV: distal pulses palpable throughout    Medical Decision Making  Medically screening exam initiated at 2:36 AM.  Appropriate orders placed.  [name] was informed that the remainder of the evaluation will be completed by another provider, this initial triage assessment does not replace  that evaluation, and the importance of remaining in the ED until their evaluation is complete.  No code stroke called as pt is out of the 4 hour window and is LVO negative.  Work-up initiated.     [name], [name] 07/24/20 [place], [place], DO 07/24/20 [phone]

## 2020-07-24 NOTE — Telephone Encounter (Signed)
Left message for patient to call back  

## 2020-07-25 ENCOUNTER — Ambulatory Visit (INDEPENDENT_AMBULATORY_CARE_PROVIDER_SITE_OTHER): Payer: Medicare Other

## 2020-07-25 ENCOUNTER — Other Ambulatory Visit: Payer: Self-pay

## 2020-07-25 DIAGNOSIS — S134XXA Sprain of ligaments of cervical spine, initial encounter: Secondary | ICD-10-CM | POA: Diagnosis not present

## 2020-07-25 DIAGNOSIS — J309 Allergic rhinitis, unspecified: Secondary | ICD-10-CM | POA: Diagnosis not present

## 2020-07-25 DIAGNOSIS — M9901 Segmental and somatic dysfunction of cervical region: Secondary | ICD-10-CM | POA: Diagnosis not present

## 2020-07-25 DIAGNOSIS — M542 Cervicalgia: Secondary | ICD-10-CM | POA: Diagnosis not present

## 2020-07-25 DIAGNOSIS — M5013 Cervical disc disorder with radiculopathy, cervicothoracic region: Secondary | ICD-10-CM | POA: Diagnosis not present

## 2020-07-25 MED ORDER — OMEPRAZOLE 40 MG PO CPDR: 40.0000 mg | DELAYED_RELEASE_CAPSULE | Freq: Two times a day (BID) | ORAL | 1 refills | Status: DC

## 2020-07-25 NOTE — Telephone Encounter (Signed)
Please clean up her med list as it relates to her antiacid therapy Would increase omeprazole to 40 mg BID-AC for 2 weeks for epigastric pain Avoid nsaids Ct abd/pelvis less than 1 yr ago did not show gallbladder or pancreas disease so would not repeat imaging right now Call if not getting better

## 2020-07-25 NOTE — Telephone Encounter (Signed)
Called patient and she states for 1 week she has been having epi-gastric pain. It is intermittent and when she eats the food sometimes feels like it gets stuck part way down, for a little bit. She is taking Omeprazole - 40mg  before breakfast. Sometimes it helps and sometimes not. Please advise

## 2020-07-25 NOTE — Telephone Encounter (Signed)
Left detailed message for pt regarding recommendations. Script with BID dosing sent to pharmacy.

## 2020-07-25 NOTE — Telephone Encounter (Signed)
Patient returned call. Sent to VM

## 2020-07-26 DIAGNOSIS — J9601 Acute respiratory failure with hypoxia: Secondary | ICD-10-CM | POA: Diagnosis not present

## 2020-07-26 NOTE — Telephone Encounter (Signed)
Pt called today to follow up on this.  She was making sure this had been done and that PCP and the return to work paperwork.  At first she was asking for a RTW "letter" then she was mentioned RTW paperwork.  Patient will need a call back to make sure everything has been handled accordingly.

## 2020-07-27 ENCOUNTER — Telehealth: Payer: Self-pay | Admitting: Family Medicine

## 2020-07-27 DIAGNOSIS — U099 Post covid-19 condition, unspecified: Secondary | ICD-10-CM | POA: Diagnosis not present

## 2020-07-27 DIAGNOSIS — J9601 Acute respiratory failure with hypoxia: Secondary | ICD-10-CM | POA: Diagnosis not present

## 2020-07-27 NOTE — Telephone Encounter (Signed)
Pt, called inquiring about her note to return back to work  Please advice

## 2020-07-27 NOTE — Telephone Encounter (Signed)
Sent patient mychart message regarding letter.

## 2020-07-28 DIAGNOSIS — S134XXA Sprain of ligaments of cervical spine, initial encounter: Secondary | ICD-10-CM | POA: Diagnosis not present

## 2020-07-28 DIAGNOSIS — M542 Cervicalgia: Secondary | ICD-10-CM | POA: Diagnosis not present

## 2020-07-28 DIAGNOSIS — M9901 Segmental and somatic dysfunction of cervical region: Secondary | ICD-10-CM | POA: Diagnosis not present

## 2020-07-28 DIAGNOSIS — M5013 Cervical disc disorder with radiculopathy, cervicothoracic region: Secondary | ICD-10-CM | POA: Diagnosis not present

## 2020-07-31 DIAGNOSIS — M9901 Segmental and somatic dysfunction of cervical region: Secondary | ICD-10-CM | POA: Diagnosis not present

## 2020-07-31 DIAGNOSIS — S134XXA Sprain of ligaments of cervical spine, initial encounter: Secondary | ICD-10-CM | POA: Diagnosis not present

## 2020-07-31 DIAGNOSIS — M5013 Cervical disc disorder with radiculopathy, cervicothoracic region: Secondary | ICD-10-CM | POA: Diagnosis not present

## 2020-07-31 DIAGNOSIS — M542 Cervicalgia: Secondary | ICD-10-CM | POA: Diagnosis not present

## 2020-08-01 ENCOUNTER — Ambulatory Visit (INDEPENDENT_AMBULATORY_CARE_PROVIDER_SITE_OTHER): Payer: Medicare Other

## 2020-08-01 DIAGNOSIS — J309 Allergic rhinitis, unspecified: Secondary | ICD-10-CM

## 2020-08-01 DIAGNOSIS — S134XXA Sprain of ligaments of cervical spine, initial encounter: Secondary | ICD-10-CM | POA: Diagnosis not present

## 2020-08-01 DIAGNOSIS — J9601 Acute respiratory failure with hypoxia: Secondary | ICD-10-CM | POA: Diagnosis not present

## 2020-08-01 DIAGNOSIS — U099 Post covid-19 condition, unspecified: Secondary | ICD-10-CM | POA: Diagnosis not present

## 2020-08-01 DIAGNOSIS — M9901 Segmental and somatic dysfunction of cervical region: Secondary | ICD-10-CM | POA: Diagnosis not present

## 2020-08-01 DIAGNOSIS — M542 Cervicalgia: Secondary | ICD-10-CM | POA: Diagnosis not present

## 2020-08-01 DIAGNOSIS — M5013 Cervical disc disorder with radiculopathy, cervicothoracic region: Secondary | ICD-10-CM | POA: Diagnosis not present

## 2020-08-03 ENCOUNTER — Other Ambulatory Visit: Payer: Self-pay | Admitting: Family Medicine

## 2020-08-03 DIAGNOSIS — F411 Generalized anxiety disorder: Secondary | ICD-10-CM

## 2020-08-07 DIAGNOSIS — S134XXA Sprain of ligaments of cervical spine, initial encounter: Secondary | ICD-10-CM | POA: Diagnosis not present

## 2020-08-07 DIAGNOSIS — I671 Cerebral aneurysm, nonruptured: Secondary | ICD-10-CM | POA: Diagnosis not present

## 2020-08-07 DIAGNOSIS — R519 Headache, unspecified: Secondary | ICD-10-CM | POA: Diagnosis not present

## 2020-08-07 DIAGNOSIS — R350 Frequency of micturition: Secondary | ICD-10-CM | POA: Diagnosis not present

## 2020-08-07 DIAGNOSIS — M9901 Segmental and somatic dysfunction of cervical region: Secondary | ICD-10-CM | POA: Diagnosis not present

## 2020-08-07 DIAGNOSIS — M5013 Cervical disc disorder with radiculopathy, cervicothoracic region: Secondary | ICD-10-CM | POA: Diagnosis not present

## 2020-08-07 DIAGNOSIS — M542 Cervicalgia: Secondary | ICD-10-CM | POA: Diagnosis not present

## 2020-08-08 ENCOUNTER — Telehealth: Payer: Medicare Other

## 2020-08-08 ENCOUNTER — Telehealth: Payer: Self-pay

## 2020-08-08 DIAGNOSIS — K220 Achalasia of cardia: Secondary | ICD-10-CM | POA: Diagnosis not present

## 2020-08-08 DIAGNOSIS — M5013 Cervical disc disorder with radiculopathy, cervicothoracic region: Secondary | ICD-10-CM | POA: Diagnosis not present

## 2020-08-08 DIAGNOSIS — S134XXA Sprain of ligaments of cervical spine, initial encounter: Secondary | ICD-10-CM | POA: Diagnosis not present

## 2020-08-08 DIAGNOSIS — Z8616 Personal history of COVID-19: Secondary | ICD-10-CM | POA: Diagnosis not present

## 2020-08-08 DIAGNOSIS — K219 Gastro-esophageal reflux disease without esophagitis: Secondary | ICD-10-CM | POA: Diagnosis not present

## 2020-08-08 DIAGNOSIS — M9901 Segmental and somatic dysfunction of cervical region: Secondary | ICD-10-CM | POA: Diagnosis not present

## 2020-08-08 DIAGNOSIS — K449 Diaphragmatic hernia without obstruction or gangrene: Secondary | ICD-10-CM | POA: Diagnosis not present

## 2020-08-08 DIAGNOSIS — K5900 Constipation, unspecified: Secondary | ICD-10-CM | POA: Diagnosis not present

## 2020-08-08 DIAGNOSIS — M542 Cervicalgia: Secondary | ICD-10-CM | POA: Diagnosis not present

## 2020-08-08 DIAGNOSIS — R1310 Dysphagia, unspecified: Secondary | ICD-10-CM | POA: Diagnosis not present

## 2020-08-08 NOTE — Telephone Encounter (Signed)
  Care Management   Follow Up Note   08/08/2020 Name: Stephanie Parks MRN: [id number] DOB: 06/20/1945   Referred by: [name], MD Reason for referral : Chronic Care Management (RNCM follow up)   An unsuccessful telephone outreach was attempted today. The patient was referred to the case management team for assistance with care management and care coordination.   Follow Up Plan: The care management team will reach out to the patient again over the next 30 days.   Thea Silversmith, RN, MSN, BSN, CCM Care Management Coordinator [facility] [phone]

## 2020-08-09 DIAGNOSIS — E876 Hypokalemia: Secondary | ICD-10-CM | POA: Diagnosis not present

## 2020-08-09 DIAGNOSIS — G44229 Chronic tension-type headache, not intractable: Secondary | ICD-10-CM | POA: Diagnosis not present

## 2020-08-09 DIAGNOSIS — U099 Post covid-19 condition, unspecified: Secondary | ICD-10-CM | POA: Diagnosis not present

## 2020-08-09 DIAGNOSIS — R0789 Other chest pain: Secondary | ICD-10-CM | POA: Diagnosis not present

## 2020-08-09 DIAGNOSIS — Z20822 Contact with and (suspected) exposure to covid-19: Secondary | ICD-10-CM | POA: Diagnosis not present

## 2020-08-09 DIAGNOSIS — R079 Chest pain, unspecified: Secondary | ICD-10-CM | POA: Diagnosis not present

## 2020-08-10 ENCOUNTER — Ambulatory Visit: Payer: Medicare Other | Admitting: Interventional Cardiology

## 2020-08-10 ENCOUNTER — Other Ambulatory Visit: Payer: Self-pay | Admitting: Family Medicine

## 2020-08-10 DIAGNOSIS — J9601 Acute respiratory failure with hypoxia: Secondary | ICD-10-CM | POA: Diagnosis not present

## 2020-08-10 DIAGNOSIS — U099 Post covid-19 condition, unspecified: Secondary | ICD-10-CM | POA: Diagnosis not present

## 2020-08-11 DIAGNOSIS — M5013 Cervical disc disorder with radiculopathy, cervicothoracic region: Secondary | ICD-10-CM | POA: Diagnosis not present

## 2020-08-11 DIAGNOSIS — M9901 Segmental and somatic dysfunction of cervical region: Secondary | ICD-10-CM | POA: Diagnosis not present

## 2020-08-11 DIAGNOSIS — M542 Cervicalgia: Secondary | ICD-10-CM | POA: Diagnosis not present

## 2020-08-11 DIAGNOSIS — S134XXA Sprain of ligaments of cervical spine, initial encounter: Secondary | ICD-10-CM | POA: Diagnosis not present

## 2020-08-14 DIAGNOSIS — J9601 Acute respiratory failure with hypoxia: Secondary | ICD-10-CM | POA: Diagnosis not present

## 2020-08-14 DIAGNOSIS — M9901 Segmental and somatic dysfunction of cervical region: Secondary | ICD-10-CM | POA: Diagnosis not present

## 2020-08-14 DIAGNOSIS — U099 Post covid-19 condition, unspecified: Secondary | ICD-10-CM | POA: Diagnosis not present

## 2020-08-14 DIAGNOSIS — M5013 Cervical disc disorder with radiculopathy, cervicothoracic region: Secondary | ICD-10-CM | POA: Diagnosis not present

## 2020-08-14 DIAGNOSIS — M542 Cervicalgia: Secondary | ICD-10-CM | POA: Diagnosis not present

## 2020-08-14 DIAGNOSIS — S134XXA Sprain of ligaments of cervical spine, initial encounter: Secondary | ICD-10-CM | POA: Diagnosis not present

## 2020-08-15 DIAGNOSIS — S134XXA Sprain of ligaments of cervical spine, initial encounter: Secondary | ICD-10-CM | POA: Diagnosis not present

## 2020-08-15 DIAGNOSIS — M9901 Segmental and somatic dysfunction of cervical region: Secondary | ICD-10-CM | POA: Diagnosis not present

## 2020-08-15 DIAGNOSIS — M5013 Cervical disc disorder with radiculopathy, cervicothoracic region: Secondary | ICD-10-CM | POA: Diagnosis not present

## 2020-08-15 DIAGNOSIS — M542 Cervicalgia: Secondary | ICD-10-CM | POA: Diagnosis not present

## 2020-08-15 NOTE — Progress Notes (Addendum)
PATIENT: Stephanie Parks DOB: 01/15/1946  REASON FOR VISIT: follow up HISTORY FROM: patient  Chief Complaint  Patient presents with   OSA on CPAP    Rm 1, 4 month FU  "no problems with CPAP"      HISTORY OF PRESENT ILLNESS: 08/17/2020 ALL:  Stephanie Parks returns for follow up for migraines. I last saw her 10/2018 and started topiramate 25mg  BID. She was seen by Dr Rexene Alberts 11/2018 and diagnosed with sleep apnea. CPAP was started. She was seen by [facility] in follow up for OSA on CPAP and doing well. She reported improvement in sleep and decreased palpitations. Uncertain if CPAP was helpful in migraine management. She continues CPAP nightly. She denies difficulty with machine or supplies but admits that she has not changed her mask recently.   She was diagnosed with Covid in 02/2020. She was hospitalized for about 6 days. She was doing well from a headache perspective until around April. Since, she has had trouble with bilateral pressure of both temples. She is taking Tyelnol 1300mg  twice daily that She was seen by Dr Hassell Done, ENT, 06/28/2020 for concerns of vestibular migraines. He started her on nortriptyline. She took nortriptyline for about 3 weeks and states it did not help. She has taken gabapentin in the past that made headaches worse. She could not tolerate topiramate started in 10/2018 due to palpitations. She has trouble with dry mouth and constipation.      11/16/2018 ALL:  Stephanie Parks is a 75 y.o. female here today for follow up of migraines. MRI unremarkable in 09/2018. She continues to have left sided tension type pain nearly everyday. It is pounding at times. She has light and sound sensitivity. Pain radiates to the back of her head and into her neck. She can have headache with or without neck pain. She has changed her pillow recently that has helped with neck pain. MR cervical spine in 07/2017 showed stenosis but no cord injury. She was seen in ER last week for elevated BP and leg  swelling. She continues to check BP at home and reports readings are 140-150/60-70. She reports following up with PCP closely for this. She recently started on losartan. Her potassium was 3.3 at ER last week. She was also started on sertraline 25mg . She does feel that stress is making her headaches worse. She did not take sumatriptan due to concerns of having a reaction. She was given Tylenol # but reports that she felt dizzy and did not like how she felt. She takes Tylenol Arthritis 1-2 tablets maybe twice daily.   She works at [employer] with varying schedules. She gets about 5 hours of sleep at night. She lives alone. She has been told that she snores. She has woken at night from time to time coughing. She wakes twice nightly to urinate. She does not feel rested when she wakes. She does have morning headaches. Her sister has sleep apnea.    HISTORY: (copied from Dr Cathren Laine note on 09/02/2018)  HPI:  Stephanie Parks is a 75 y.o. female here as requested by Copland, Gay Filler, MD for headaches.  Past medical history of stable right-sided anterior aneurysm, hypertension, bronchitis, chest pain, coughing, dizziness, hypercholesterolemia, hyperlipidemia, migraines.  She is a former smoker half pack a day for 20 years, 10 pack years.  Per emergency room notes, she was seen May 20, she has been frequenting multiple emergency rooms for evaluation but despite testing no specific diagnosis has been given to  her.  In the emergency room in May she reported that the symptoms have been ongoing for months if not more, she gets pressure in her head and reports she feels like her left arm gets numb intermittently.  But no weakness or dropping things in the left. She went to the eye doctor yesterday and everything is fine, nothing unusual. She has neck tightness on the right and pain in her neck.    Headaches started 2-3 months. This is different than previous headaches. She feels pressure or like something is running in her  head however the sensations of running on her head have stopped. She say Dr. Christella Noa a month ago and everthing is stable. She has pressure on the left side in the temple area. Sometimes in the right temple as well. Also the back of there neck. She has had cardiac evals, CT Angio of the brain, not an MRI. The headaches last 30 minutes to an hour. The last one was 3 days ago. 2-3x a week. She does not wake up with them. The light bothers her, a little lightheaded, nausea, sound sensitivity, can be pounding, 2 tylenols help. She had an emg/ncs last year and it was unremarkable. She has episodic blurry vision with the headaches, painful can be very painful, positional worse with standing she has to lay down.    Reviewed notes, labs and imaging from outside physicians, which showed:   I reviewed emergency room notes.  Patient was in the emergency room Jul 16, 2018.  She reported pressure in her head that feels like water running down her head with left arm weakness on and off since February.  She has been seen numerous times with no diagnosis. She has been having headaches for months.  She has been going to multiple emergency rooms for evaluation but despite testing no specific diagnosis has been given to her.  She gets brief pressure sensations around her temple on the left.  The last for several seconds at a time and come and go.  She also has some sensation of something running down her head.  No visual symptoms.  No double vision no vision loss.  No nausea or vomiting.  She also feels like her left arm gets numb intermittently this comes and goes.  She does not find that she is dropping things or weak to function.  Both of these symptoms have been several months in duration or more.  On exam she had left temple tenderness however left arm was normal and the rest of her neurologic exam and physical exam were also normal.   She was also seen by my colleague Dr. Jannifer Franklin for leg discomfort in the past.  EMG nerve  conduction study was unremarkable.  She was seen for leg pain that is intermittent in nature and may hit one leg or the other lasting only a few seconds and going away.   REVIEW OF SYSTEMS: Out of a complete 14 system review of symptoms, the patient complains only of the following symptoms, dizziness, headache, numbness, weakness and all other reviewed systems are negative.  Epworth sleepiness scale: 8 Fatigue severity scale: 19  ALLERGIES: Allergies  Allergen Reactions   Shellfish Allergy Hives and Swelling   Ace Inhibitors Other (See Comments)    Pt cannot recall her reaction but tolerates arb    Gabapentin Other (See Comments)    headaches   Pitavastatin     Unknown reaction    Topiramate     Heart race,  Sulfa Antibiotics Other (See Comments) and Rash    Fine bumps    HOME MEDICATIONS: Outpatient Medications Prior to Visit  Medication Sig Dispense Refill   acetaminophen (TYLENOL) 325 MG tablet Take 650 mg by mouth. For headaches     albuterol (PROAIR HFA) 108 (90 Base) MCG/ACT inhaler Inhale 2 puffs into the lungs every 4 (four) hours as needed for wheezing or shortness of breath. 1 each 1   ascorbic acid (VITAMIN C) 500 MG tablet Take 500 mg by mouth daily.     Calcium Carb-Cholecalciferol (CALCIUM 600 + D PO) Take 1 tablet by mouth daily.     ELDERBERRY PO Take 50 mg by mouth daily.     EPINEPHrine (EPIPEN 2-PAK) 0.3 mg/0.3 mL IJ SOAJ injection Inject 0.3 mLs (0.3 mg total) into the muscle as needed for anaphylaxis. Per allergen immunotherapy protocol 1 each 2   fluticasone (FLONASE) 50 MCG/ACT nasal spray USE 2 SPRAYS IN NOSTRIL(S) DAILY AS NEEDED.  In the right nostril point the applicator out toward your right ear.  In the left nostril point the applicator out toward her left ear (Patient taking differently: Place 2 sprays into both nostrils daily as needed for allergies.) 16 g 9   losartan (COZAAR) 100 MG tablet Take 1 tablet (100 mg total) by mouth daily. 90 tablet 1    metoprolol succinate (TOPROL-XL) 25 MG 24 hr tablet Take 0.5 tablets (12.5 mg total) by mouth daily. 45 tablet 0   Multiple Vitamins-Minerals (MULTIVITAL) tablet Take 1 tablet by mouth daily.      omeprazole (PRILOSEC) 40 MG capsule Take 1 capsule (40 mg total) by mouth in the morning and at bedtime. 60 capsule 1   potassium chloride SA (KLOR-CON M20) 20 MEQ tablet TAKE 2 TABLETS ONE DAY THEN 1 TABLET THE NEXT 180 tablet 1   rizatriptan (MAXALT-MLT) 10 MG disintegrating tablet Take 10 mg by mouth as needed for migraine. May repeat in 2 hours if needed     rosuvastatin (CRESTOR) 10 MG tablet TAKE 1 TABLET BY MOUTH EVERY DAY (Patient taking differently: Take 10 mg by mouth daily.) 90 tablet 3   Specialty Vitamins Products (MAGNESIUM, AMINO ACID CHELATE,) 133 MG tablet Take 1 tablet by mouth daily. Dose unknown     spironolactone (ALDACTONE) 25 MG tablet Take 0.5 tablets (12.5 mg total) by mouth daily. 15 tablet 2   zinc gluconate 50 MG tablet Take 50 mg by mouth daily.     apixaban (ELIQUIS) 5 MG TABS tablet Take 1 tablet (5 mg total) by mouth 2 (two) times daily. 60 tablet 1   busPIRone (BUSPAR) 7.5 MG tablet Take 1 tablet (7.5 mg total) by mouth 2 (two) times daily. 180 tablet 0   famotidine (PEPCID) 20 MG tablet Take 1 tablet (20 mg total) by mouth 2 (two) times daily. (Patient not taking: Reported on 07/04/2020) 60 tablet 2   No facility-administered medications prior to visit.    PAST MEDICAL HISTORY: Past Medical History:  Diagnosis Date   3-vessel CAD 03/01/2015   Achalasia 09/28/2012   Allergic rhinitis 03/01/2015   Anemia    Aneurysm ([place]) 03/01/2015   BP (high blood pressure) 03/01/2015   Bronchitis 04/23/2018   Cephalalgia 08/24/2012   Overview:  ICD-10 cut over     Chest pain 04/04/2011   CN (constipation) 09/28/2012   Coughing 04/24/2017   Decreased potassium in the blood 03/01/2015   Diverticulosis    Dizziness 03/01/2015   GERD (gastroesophageal reflux disease)  Hiatal hernia     Hypercholesterolemia 03/01/2015   Hyperlipidemia    Hypertension    Ingrown toenail 08/10/2017   Inguinal hernia    Migraine headache    Onychomycosis 12/08/2017   Paresthesia of arm 03/01/2015   Schatzki's ring    Tendonitis, Achilles, right 12/08/2017   Unilateral primary osteoarthritis, left knee 10/07/2017    PAST SURGICAL HISTORY: Past Surgical History:  Procedure Laterality Date   ABDOMINAL HYSTERECTOMY     BACK SURGERY     x 3   CARPAL TUNNEL RELEASE     left wrist   CERVICAL SPINE SURGERY     HEMORRHOID SURGERY      FAMILY HISTORY: Family History  Problem Relation Age of Onset   Allergic rhinitis Sister    Diabetes Sister    Hypertension Sister    Heart attack Father 47   Heart disease Father    Stomach cancer Maternal Grandmother    Heart disease Mother    Colon cancer Neg Hx    Angioedema Neg Hx    Asthma Neg Hx    Eczema Neg Hx    Urticaria Neg Hx    Immunodeficiency Neg Hx    Breast cancer Neg Hx    Migraines Neg Hx    Headache Neg Hx     SOCIAL HISTORY: Social History   Socioeconomic History   Marital status: Divorced    Spouse name: Not on file   Number of children: 2   Years of education: Not on file   Highest education level: Some college, no degree  Occupational History   Occupation: Surveyor, quantity: [id number]  Tobacco Use   Smoking status: Former    Packs/day: 0.50    Years: 20.00    Pack years: 10.00    Types: Cigarettes    Quit date: 02/19/1984    Years since quitting: 36.5   Smokeless tobacco: Never  Vaping Use   Vaping Use: Never used  Substance and Sexual Activity   Alcohol use: No   Drug use: No   Sexual activity: Yes    Partners: Male  Other Topics Concern   Not on file  Social History Narrative   Lives alone   Caffeine use: Coffee one cup daily   Right handed    Son is her next of kin   Working at [employer] broadcast assistant Strain: Not on file  Food Insecurity: No Food  Insecurity   Worried About Charity fundraiser in the Last Year: Never true   Arboriculturist in the Last Year: Never true  Transportation Needs: No Transportation Needs   Lack of Transportation (Medical): No   Lack of Transportation (Non-Medical): No  Physical Activity: Not on file  Stress: Not on file  Social Connections: Not on file  Intimate Partner Violence: Not on file      PHYSICAL EXAM  Vitals:   08/17/20 1116  BP: 119/62  Pulse: 67  Weight: 191 lb 12.8 oz (87 kg)  Height: 5\' 5"  (1.651 m)    Body mass index is 31.92 kg/m.  Generalized: Well developed, in no acute distress  Cardiology: normal rate and rhythm, no murmur noted Respiratory: Clear to auscultation bilaterally Neurological examination  Mentation: Alert oriented to time, place, history taking. Follows all commands speech and language fluent Cranial nerve II-XII: Pupils were equal round reactive to light. Extraocular movements were full, visual field were full on confrontational test.  Facial sensation and strength were normal. Head turning and shoulder shrug  were normal and symmetric. Motor: The motor testing reveals 5 over 5 strength of all 4 extremities. Good symmetric motor tone is noted throughout.  Gait and station: Gait is normal.   DIAGNOSTIC DATA (LABS, IMAGING, TESTING) - I reviewed patient records, labs, notes, testing and imaging myself where available.  No flowsheet data found.   Lab Results  Component Value Date   WBC 6.7 07/24/2020   HGB 11.6 (L) 07/24/2020   HCT 34.0 (L) 07/24/2020   MCV 88.7 07/24/2020   PLT 311 07/24/2020      Component Value Date/Time   NA 142 07/24/2020 0304   NA 143 05/15/2020 1534   K 3.1 (L) 07/24/2020 0304   CL 103 07/24/2020 0304   CO2 27 07/24/2020 0234   GLUCOSE 104 (H) 07/24/2020 0304   BUN 15 07/24/2020 0304   BUN 9 05/18/2020 0000   CREATININE 0.70 07/24/2020 0304   CREATININE 0.95 (H) 11/10/2019 1436   CALCIUM 9.2 07/24/2020 0234   PROT 6.6  07/24/2020 0234   PROT 6.2 07/28/2018 0756   ALBUMIN 3.3 (L) 07/24/2020 0234   ALBUMIN 4.0 07/28/2018 0756   AST 19 07/24/2020 0234   ALT 12 07/24/2020 0234   ALKPHOS 59 07/24/2020 0234   BILITOT 0.4 07/24/2020 0234   BILITOT 0.4 07/28/2018 0756   GFRNONAA >60 07/24/2020 0234   GFRAA 82 05/18/2020 0000   Lab Results  Component Value Date   CHOL 161 06/02/2019   HDL 56.00 06/02/2019   LDLCALC 87 06/02/2019   TRIG 98 03/12/2020   CHOLHDL 3 06/02/2019   Lab Results  Component Value Date   HGBA1C 5.9 06/02/2019   Lab Results  Component Value Date   VITAMINB12 1,263 (H) 03/17/2020   Lab Results  Component Value Date   TSH 3.80 03/08/2020       ASSESSMENT AND PLAN 75 y.o. year old female  has a past medical history of 3-vessel CAD (03/01/2015), Achalasia (09/28/2012), Allergic rhinitis (03/01/2015), Anemia, Aneurysm ([place]) (03/01/2015), BP (high blood pressure) (03/01/2015), Bronchitis (04/23/2018), Cephalalgia (08/24/2012), Chest pain (04/04/2011), CN (constipation) (09/28/2012), Coughing (04/24/2017), Decreased potassium in the blood (03/01/2015), Diverticulosis, Dizziness (03/01/2015), GERD (gastroesophageal reflux disease), Hiatal hernia, Hypercholesterolemia (03/01/2015), Hyperlipidemia, Hypertension, Ingrown toenail (08/10/2017), Inguinal hernia, Migraine headache, Onychomycosis (12/08/2017), Paresthesia of arm (03/01/2015), Schatzki's ring, Tendonitis, Achilles, right (12/08/2017), and Unilateral primary osteoarthritis, left knee (10/07/2017). here with     ICD-10-CM   1. Migraine without aura and without status migrainosus, not intractable  G43.009     2. OSA on CPAP  G47.33 For home use only DME continuous positive airway pressure (CPAP)   Z99.89        Stephanie Parks reports improvement in headaches until about 3 months ago. Headaches have returned on a daily basis with greater than 15 migraine days a month. Nortriptyline was recently started by ENT but she does not feel it has helped. She  has failed topiramate and gabapentin. We will try Ajovy. She was given first injection int he office, today. She was educated on appropriate storage and possible side effects. Product/process development scientist given. She will continue Tylenol for abortive therapy. She is doing well on CPAP therapy. Compliance report shows excellent compliance. She was encouraged to continue nightly use for at least 4 hours. She does continue to follow Dr. Christella Noa for history of brain aneurysm.  She was advised to follow closely with primary care as well for chronic comorbidities.  She  will follow-up with me in 3-6 months, sooner if needed.  She verbalizes understanding and agreement with this plan.   Orders Placed This Encounter  Procedures   For home use only DME continuous positive airway pressure (CPAP)    Supplies    Order Specific Question:   Length of Need    Answer:   Lifetime    Order Specific Question:   Patient has OSA or probable OSA    Answer:   Yes    Order Specific Question:   Is the patient currently using CPAP in the home    Answer:   Yes    Order Specific Question:   Settings    Answer:   Other see comments    Order Specific Question:   CPAP supplies needed    Answer:   Mask, headgear, cushions, filters, heated tubing and water chamber      Meds ordered this encounter  Medications   Fremanezumab-vfrm (AJOVY) 225 MG/1.5ML SOAJ    Sig: Inject 225 mg into the skin every 30 (thirty) days.    Dispense:  4.5 mL    Refill:  3    Order Specific Question:   Supervising Provider    Answer:   Melvenia Beam [[id number]]   Fremanezumab-vfrm (AJOVY) 225 MG/1.5ML SOAJ    Sig: Inject 225 mg into the skin every 30 (thirty) days.    Dispense:  1.5 mL    Refill:  0    Order Specific Question:   Supervising Provider    Answer:   Melvenia Beam [[id number]]    Order Specific Question:   Lot Number?    Answer:   [id number]    Order Specific Question:   Expiration Date?    Answer:   09/18/2021        Debbora Presto,  FNP-C 08/17/2020, 3:10 PM [facility] [address] [place], [place] [address] [phone]  agree with assessment and plan as stated.     Sarina Ill, MD [facility]

## 2020-08-15 NOTE — Patient Instructions (Addendum)
Please continue using your CPAP regularly. While your insurance requires that you use CPAP at least 4 hours each night on 70% of the nights, I recommend, that you not skip any nights and use it throughout the night if you can. Getting used to CPAP and staying with the treatment long term does take time and patience and discipline. Untreated obstructive sleep apnea when it is moderate to severe can have an adverse impact on cardiovascular health and raise her risk for heart disease, arrhythmias, hypertension, congestive heart failure, stroke and diabetes. Untreated obstructive sleep apnea causes sleep disruption, nonrestorative sleep, and sleep deprivation. This can have an impact on your day to day functioning and cause daytime sleepiness and impairment of cognitive function, memory loss, mood disturbance, and problems focussing. Using CPAP regularly can improve these symptoms.   Below is our plan:  We will start Ajovy every 30 days for your migraines. Continue Tylenol as needed for abortive therapy   Please make sure you are staying well hydrated. I recommend 50-60 ounces daily. Well balanced diet and regular exercise encouraged. Consistent sleep schedule with 6-8 hours recommended.   Please continue follow up with care team as directed.   Follow up with me in 3-6 months   You may receive a survey regarding today's visit. I encourage you to leave honest feed back as I do use this information to improve patient care. Thank you for seeing me today!

## 2020-08-17 ENCOUNTER — Encounter: Payer: Self-pay | Admitting: Family Medicine

## 2020-08-17 ENCOUNTER — Other Ambulatory Visit: Payer: Self-pay

## 2020-08-17 ENCOUNTER — Ambulatory Visit: Payer: Medicare Other | Admitting: Family Medicine

## 2020-08-17 VITALS — BP 119/62 | HR 67 | Ht 65.0 in | Wt 191.8 lb

## 2020-08-17 DIAGNOSIS — G43009 Migraine without aura, not intractable, without status migrainosus: Secondary | ICD-10-CM

## 2020-08-17 DIAGNOSIS — Z9989 Dependence on other enabling machines and devices: Secondary | ICD-10-CM

## 2020-08-17 DIAGNOSIS — G4733 Obstructive sleep apnea (adult) (pediatric): Secondary | ICD-10-CM | POA: Diagnosis not present

## 2020-08-17 DIAGNOSIS — S134XXA Sprain of ligaments of cervical spine, initial encounter: Secondary | ICD-10-CM | POA: Diagnosis not present

## 2020-08-17 DIAGNOSIS — M542 Cervicalgia: Secondary | ICD-10-CM | POA: Diagnosis not present

## 2020-08-17 DIAGNOSIS — M5013 Cervical disc disorder with radiculopathy, cervicothoracic region: Secondary | ICD-10-CM | POA: Diagnosis not present

## 2020-08-17 DIAGNOSIS — M9901 Segmental and somatic dysfunction of cervical region: Secondary | ICD-10-CM | POA: Diagnosis not present

## 2020-08-17 MED ORDER — AJOVY 225 MG/1.5ML ~~LOC~~ SOAJ: 225.0000 mg | SUBCUTANEOUS | 3 refills | Status: DC

## 2020-08-17 MED ORDER — AJOVY 225 MG/1.5ML ~~LOC~~ SOAJ: 225.0000 mg | SUBCUTANEOUS | 0 refills | Status: DC

## 2020-08-18 ENCOUNTER — Other Ambulatory Visit: Payer: Self-pay | Admitting: Internal Medicine

## 2020-08-19 ENCOUNTER — Other Ambulatory Visit: Payer: Self-pay | Admitting: Family Medicine

## 2020-08-19 DIAGNOSIS — R0789 Other chest pain: Secondary | ICD-10-CM

## 2020-08-22 DIAGNOSIS — M542 Cervicalgia: Secondary | ICD-10-CM | POA: Diagnosis not present

## 2020-08-22 DIAGNOSIS — S134XXA Sprain of ligaments of cervical spine, initial encounter: Secondary | ICD-10-CM | POA: Diagnosis not present

## 2020-08-22 DIAGNOSIS — M9901 Segmental and somatic dysfunction of cervical region: Secondary | ICD-10-CM | POA: Diagnosis not present

## 2020-08-22 DIAGNOSIS — M5013 Cervical disc disorder with radiculopathy, cervicothoracic region: Secondary | ICD-10-CM | POA: Diagnosis not present

## 2020-08-23 DIAGNOSIS — U099 Post covid-19 condition, unspecified: Secondary | ICD-10-CM | POA: Diagnosis not present

## 2020-08-23 DIAGNOSIS — J9601 Acute respiratory failure with hypoxia: Secondary | ICD-10-CM | POA: Diagnosis not present

## 2020-08-23 DIAGNOSIS — K220 Achalasia of cardia: Secondary | ICD-10-CM | POA: Diagnosis not present

## 2020-08-24 DIAGNOSIS — U099 Post covid-19 condition, unspecified: Secondary | ICD-10-CM | POA: Diagnosis not present

## 2020-08-24 DIAGNOSIS — M5013 Cervical disc disorder with radiculopathy, cervicothoracic region: Secondary | ICD-10-CM | POA: Diagnosis not present

## 2020-08-24 DIAGNOSIS — M542 Cervicalgia: Secondary | ICD-10-CM | POA: Diagnosis not present

## 2020-08-24 DIAGNOSIS — M9901 Segmental and somatic dysfunction of cervical region: Secondary | ICD-10-CM | POA: Diagnosis not present

## 2020-08-24 DIAGNOSIS — J9601 Acute respiratory failure with hypoxia: Secondary | ICD-10-CM | POA: Diagnosis not present

## 2020-08-24 DIAGNOSIS — S134XXA Sprain of ligaments of cervical spine, initial encounter: Secondary | ICD-10-CM | POA: Diagnosis not present

## 2020-08-24 NOTE — Progress Notes (Addendum)
[facility] [address] [place], [place] [address] [phone]  Date:  08/31/2020   Name:  NILE DORNING   DOB:  12-08-1945   MRN:  [id number]  PCP:  [name], MD    Chief Complaint: Anxiety (Started back on buspirone 7.5 mg twice a day for anxiety./Pt says she is frustrated with the HA- she has received an injection in the abdomen that lasted a week- by Neurology./Also has a new pain in the right shoulder.)   History of Present Illness:  SIRENITY SHEW is a 75 y.o. very pleasant female patient who presents with the following: History of : hypertension, hyperlipidemia, allergic rhinitis, OSA on CPAP, triple vaccinated against Covid however was admitted in February 20 2020 [facility] with confusion and encephalopathy and found to have COVID-19 pneumonia. A CT scan done at that point of time also showed an acute PE. Patient required high flow nasal cannula up to 15 L and was discharged home on 4 L oxygen and was treated with remdesivir and steroids  Seen today for short term follow-up She has been off oxygen for about 3 months now  Last visit with myself 5/25- at that time she was being seen frequently in the ER with concern of CP and BP changes I started her back on buspirone in May for anxiety, which is thought to be at least in part driving her frequent ER visits  In the interim she went to the ER twice, 6/6 and 6/22 Seen by pulmonology clinic on 6/8- [facility] She also saw Amy Lomax with neurology on 6/30 due to HA- they started Ajovy for her, and she is getting Emgality the next time due to insurance coverage. She will pick up this rx and have it administered at Neurology  So far she does not feel like she has gotten benefit from her Ajovy injection   She is having esophageal manometry done at [facility] tomorrow  She last saw cardiology- Dr Irish Lack- in March of this year   Today her main concern is  her headaches Her chest pain seems to be quiet right now She is not using buspirone as she felt like it "made me jittery"   Pt declines a pneumonia vaccine today She does not want to do any vaccines today   She is due for a mammogram- I will order for her   Also, she notes that her right shoulder is painful.  She has been seeing a chiropractor for her neck, but notes that her shoulder problems have failed to resolve.  She notes pain and stiffness when she uses her right shoulder.  Left shoulder is okay Patient Active Problem List   Diagnosis Date Noted   Headache 06/07/2020   Acute respiratory failure with hypoxia (HCC) 05/02/2020   Post-COVID chronic dyspnea 05/02/2020   Pulsatile neck mass 04/26/2020   Pneumonia due to COVID-19 virus 03/12/2020   Headache disorder 01/04/2020   Seasonal allergic conjunctivitis 10/04/2019   Dysfunction of right eustachian tube 10/04/2019   Pre-diabetes 06/02/2019   Seasonal and perennial allergic rhinitis 12/22/2018   Heart palpitations 12/22/2018   History of chest pain 07/20/2018   Brain aneurysm 07/20/2018   Dyslipidemia 05/04/2018   Bronchitis 04/23/2018   Tendonitis, Achilles, right 12/08/2017   Onychomycosis 12/08/2017   Unilateral primary osteoarthritis, left knee 10/07/2017   Ingrown toenail 08/10/2017   Coughing 04/24/2017   CAD (coronary artery disease) 03/01/2015  Aneurysm ([place]) 03/01/2015   Dizziness 03/01/2015   Paresthesia of arm 03/01/2015   Seasonal allergic rhinitis 03/01/2015   Achalasia 09/28/2012   CN (constipation) 09/28/2012   Cephalalgia 08/24/2012   Essential hypertension 04/19/2011   Hyperlipidemia 04/19/2011   GERD (gastroesophageal reflux disease) 04/19/2011   Chest pain 04/04/2011    Past Medical History:  Diagnosis Date   3-vessel CAD 03/01/2015   Achalasia 09/28/2012   Allergic rhinitis 03/01/2015   Anemia    Aneurysm ([place]) 03/01/2015   BP (high blood pressure) 03/01/2015   Bronchitis 04/23/2018    Cephalalgia 08/24/2012   Overview:  ICD-10 cut over     Chest pain 04/04/2011   CN (constipation) 09/28/2012   Coughing 04/24/2017   Decreased potassium in the blood 03/01/2015   Diverticulosis    Dizziness 03/01/2015   GERD (gastroesophageal reflux disease)    Hiatal hernia    Hypercholesterolemia 03/01/2015   Hyperlipidemia    Hypertension    Ingrown toenail 08/10/2017   Inguinal hernia    Migraine headache    Onychomycosis 12/08/2017   Paresthesia of arm 03/01/2015   Schatzki's ring    Tendonitis, Achilles, right 12/08/2017   Unilateral primary osteoarthritis, left knee 10/07/2017    Past Surgical History:  Procedure Laterality Date   ABDOMINAL HYSTERECTOMY     BACK SURGERY     x 3   CARPAL TUNNEL RELEASE     left wrist   CERVICAL SPINE SURGERY     HEMORRHOID SURGERY      Social History   Tobacco Use   Smoking status: Former    Packs/day: 0.50    Years: 20.00    Pack years: 10.00    Types: Cigarettes    Quit date: 02/19/1984    Years since quitting: 36.5   Smokeless tobacco: Never  Vaping Use   Vaping Use: Never used  Substance Use Topics   Alcohol use: No   Drug use: No    Family History  Problem Relation Age of Onset   Allergic rhinitis Sister    Diabetes Sister    Hypertension Sister    Heart attack Father 42   Heart disease Father    Stomach cancer Maternal Grandmother    Heart disease Mother    Colon cancer Neg Hx    Angioedema Neg Hx    Asthma Neg Hx    Eczema Neg Hx    Urticaria Neg Hx    Immunodeficiency Neg Hx    Breast cancer Neg Hx    Migraines Neg Hx    Headache Neg Hx     Allergies  Allergen Reactions   Shellfish Allergy Hives and Swelling   Ace Inhibitors Other (See Comments)    Pt cannot recall her reaction but tolerates arb    Gabapentin Other (See Comments)    headaches   Pitavastatin     Unknown reaction    Topiramate     Heart race,    Sulfa Antibiotics Other (See Comments) and Rash    Fine bumps    Medication list has  been reviewed and updated.  Current Outpatient Medications on File Prior to Visit  Medication Sig Dispense Refill   acetaminophen (TYLENOL) 325 MG tablet Take 650 mg by mouth. For headaches     albuterol (PROAIR HFA) 108 (90 Base) MCG/ACT inhaler Inhale 2 puffs into the lungs every 4 (four) hours as needed for wheezing or shortness of breath. 1 each 1   ascorbic acid (VITAMIN C) 500 MG tablet  Take 500 mg by mouth daily.     Calcium Carb-Cholecalciferol (CALCIUM 600 + D PO) Take 1 tablet by mouth daily.     ELDERBERRY PO Take 50 mg by mouth daily.     EPINEPHrine (EPIPEN 2-PAK) 0.3 mg/0.3 mL IJ SOAJ injection Inject 0.3 mLs (0.3 mg total) into the muscle as needed for anaphylaxis. Per allergen immunotherapy protocol 1 each 2   fluticasone (FLONASE) 50 MCG/ACT nasal spray USE 2 SPRAYS IN NOSTRIL(S) DAILY AS NEEDED.  In the right nostril point the applicator out toward your right ear.  In the left nostril point the applicator out toward her left ear (Patient taking differently: Place 2 sprays into both nostrils daily as needed for allergies.) 16 g 9   Galcanezumab-gnlm (EMGALITY) 120 MG/ML SOAJ Inject 120 mg into the skin every 30 (thirty) days. 1.12 mL 11   losartan (COZAAR) 100 MG tablet Take 1 tablet (100 mg total) by mouth daily. 90 tablet 1   metoprolol succinate (TOPROL-XL) 25 MG 24 hr tablet Take 0.5 tablets (12.5 mg total) by mouth daily. 45 tablet 0   Multiple Vitamins-Minerals (MULTIVITAL) tablet Take 1 tablet by mouth daily.      omeprazole (PRILOSEC) 40 MG capsule Take 1 capsule (40 mg total) by mouth in the morning and at bedtime. 60 capsule 1   potassium chloride SA (KLOR-CON M20) 20 MEQ tablet TAKE 2 TABLETS ONE DAY THEN 1 TABLET THE NEXT 180 tablet 1   rosuvastatin (CRESTOR) 10 MG tablet TAKE 1 TABLET BY MOUTH EVERY DAY (Patient taking differently: Take 10 mg by mouth daily.) 90 tablet 3   Specialty Vitamins Products (MAGNESIUM, AMINO ACID CHELATE,) 133 MG tablet Take 1 tablet by mouth  daily. Dose unknown     UBRELVY 100 MG TABS TAKE 1 TABLET BY MOUTH AS NEEDED. CAN TAKE 1 ADDITIONAL TABLET IN 2 HOURS IF NEEDED AFTER INITIAL DOSE. NO MORE THAN 2 TABLETS IN 24 HOURS. 10 tablet 5   zinc gluconate 50 MG tablet Take 50 mg by mouth daily.     No current facility-administered medications on file prior to visit.    Review of Systems:  As per HPI- otherwise negative.   Physical Examination: Vitals:   08/31/20 0844  BP: (!) 144/62  Pulse: 78  Resp: 18  Temp: 98.1 F (36.7 C)  SpO2: 99%   Vitals:   08/31/20 0844  Weight: 191 lb 3.2 oz (86.7 kg)  Height: 5\' 5"  (1.651 m)   Body mass index is 31.82 kg/m. Ideal Body Weight: Weight in (lb) to have BMI = 25: 149.9  GEN: no acute distress. Mild obesity, looks well and her normal self HEENT: Atraumatic, Normocephalic. Bilateral TM wnl, oropharynx normal.  PEERL,EOMI.   Ears and Nose: No external deformity. CV: RRR, No M/G/R. No JVD. No thrill. No extra heart sounds. PULM: CTA B, no wheezes, crackles, rhonchi. No retractions. No resp. distress. No accessory muscle use. ABD: S, NT, ND EXTR: No c/c/e PSYCH: Normally interactive. Conversant.  Right shoulder reveals approximately 15 degrees loss of range of motion to flexion and abduction.  She also has mild restriction of internal or external rotation, and tenderness with palpation over the rotator cuff tendon insertion  Assessment and Plan: Chronic right shoulder pain - Plan: Ambulatory referral to Orthopedic Surgery  Chronic nonintractable headache, unspecified headache type  Encounter for screening mammogram for malignant neoplasm of breast - Plan: MM 3D SCREEN BREAST BILATERAL  Palpitation - Plan: metoprolol succinate (TOPROL-XL) 25 MG 24 hr  tablet  Hypokalemia - Plan: Basic metabolic panel Patient seen today for periodic follow-up visit  Discussed her headaches.  She is seeing neurology and tried some new treatments in the CGRP inhibitor class.  I hope that this  will be helpful for her  Ordered mammogram  Refill metoprolol which she uses for palpitations  Referral to orthopedics to discuss her shoulder issue  Check potassium  I asked her to follow-up in 3 months This visit occurred during the SARS-CoV-2 public health emergency.  Safety protocols were in place, including screening questions prior to the visit, additional usage of staff PPE, and extensive cleaning of exam room while observing appropriate contact time as indicated for disinfecting solutions.   Signed Lamar Blinks, MD  Received her labs as below, message to patient Results for orders placed or performed in visit on 42/87/68  Basic metabolic panel  Result Value Ref Range   Sodium 143 135 - 145 mEq/L   Potassium 3.7 3.5 - 5.1 mEq/L   Chloride 104 96 - 112 mEq/L   CO2 32 19 - 32 mEq/L   Glucose, Bld 90 70 - 99 mg/dL   BUN 15 6 - 23 mg/dL   Creatinine, Ser 0.84 0.40 - 1.20 mg/dL   GFR 68.23 >60.00 mL/min   Calcium 9.2 8.4 - 10.5 mg/dL

## 2020-08-28 ENCOUNTER — Other Ambulatory Visit: Payer: Self-pay | Admitting: Neurology

## 2020-08-28 ENCOUNTER — Telehealth: Payer: Self-pay | Admitting: Family Medicine

## 2020-08-28 DIAGNOSIS — G43009 Migraine without aura, not intractable, without status migrainosus: Secondary | ICD-10-CM

## 2020-08-28 MED ORDER — UBRELVY 100 MG PO TABS: 1.0000 | ORAL_TABLET | ORAL | 5 refills | Status: DC | PRN

## 2020-08-28 NOTE — Telephone Encounter (Signed)
Called pt back. Relayed Dr. Cathren Laine message. She is aware it can take some time for Ajovy to start working. Agreeable to try Ubrevly as a rescue medication for current migraine. She was told by pharmacy on 08/17/20 that Ajovy is not covered by her insurance. Advised we will follow up with her pharmacy to get more info on this and let her know an update once we know more. She verbalized understanding.  Called [facility]. Spoke w/ pharmacy rep. All CGRP injections require PA. Confirmed Ajovy needs PA.   ID: [id number], Kara Dies [id number], PCN: [id number], Grp: [id number], Phone# [phone].  Submitted PA Ajovy on CMM. Key: [id number] - PA Case ID: [id number]. Waiting on determination from [employer] Medicare Part D.

## 2020-08-28 NOTE — Telephone Encounter (Signed)
Called pt back. She saw Amy L, NP 08/17/20. She received Ajovy inj that day. Migraines started back up a week after this. Not on nortriptyline. Not on any other meds for migraines. She has tried/failed: topamax/gabapentin. Current migraine is constant and every day. Tylenol only helps for 15-80min and then migraine returns. Aware AL,NP out. Will speak w/ MD and call her back.

## 2020-08-28 NOTE — Telephone Encounter (Signed)
Pt called, migraine returned a week after injection. Can you prescribe something for my migraine? Would like a call from the nurse.

## 2020-08-29 ENCOUNTER — Other Ambulatory Visit: Payer: Self-pay | Admitting: Neurology

## 2020-08-29 ENCOUNTER — Other Ambulatory Visit: Payer: Self-pay | Admitting: *Deleted

## 2020-08-29 DIAGNOSIS — G43709 Chronic migraine without aura, not intractable, without status migrainosus: Secondary | ICD-10-CM

## 2020-08-29 MED ORDER — EMGALITY 120 MG/ML ~~LOC~~ SOAJ
120.0000 mg | SUBCUTANEOUS | 11 refills | Status: DC
Start: 2020-08-29 — End: 2021-02-06

## 2020-08-29 NOTE — Telephone Encounter (Signed)
Dr. Jaynee Eagles- ok to change pt to [facility]? Received fax from [employer] that PA Ajovy denied. Must try/fail Emgality first or give medical reason why this cannot be used.

## 2020-08-29 NOTE — Telephone Encounter (Addendum)
Called pt back. Relayed Dr. Cathren Laine message. She states the pharmacy already spoke w/ her and advised emgality called in but will take two days to process. She has not been able to pick up Ubrelvy either. States pharmacy left her VM letting her know they were going to reach out to our office to ask for alternative to [place]. Advised I will f/u [facility]/ [facility] to get more info on this and call her back.  Called [facility] at [phone]. Pharmacy closed for lunch until 2pm. LVM for pharmacy to call back to confirm if PA needed for [place] and Emgality. I ended up submitting PA for both. See below.  Submitted urgent PA Emgality on CMM. Key: [id number]. Waiting on determination from [employer] Medicare Part D. Received instant approval: "Request Reference Number: [id number]. EMGALITY INJ 120MG /ML is approved through 02/17/2021. Your patient may now fill this prescription and it will be covered."  Submitted urgent PA Ubrelvy on CMM. Key: [id number]. Waiting on determination from [employer]. PA approved: Request Reference Number: [id number]. UBRELVY TAB 100MG  is approved through 02/17/2021. Your patient may now fill this prescription and it will be covered.  Called [facility] back and informed both meds approved via insurance. Getting paid claim for both medications. They will get it ready for pt. Copay: 100.00 for both. I called pt to let her know both approved. She verbalized understanding and will follow up with the pharmacy.

## 2020-08-31 ENCOUNTER — Other Ambulatory Visit: Payer: Self-pay

## 2020-08-31 ENCOUNTER — Ambulatory Visit (INDEPENDENT_AMBULATORY_CARE_PROVIDER_SITE_OTHER): Payer: Medicare Other | Admitting: Family Medicine

## 2020-08-31 VITALS — BP 144/62 | HR 78 | Temp 98.1°F | Resp 18 | Ht 65.0 in | Wt 191.2 lb

## 2020-08-31 DIAGNOSIS — M25511 Pain in right shoulder: Secondary | ICD-10-CM

## 2020-08-31 DIAGNOSIS — E876 Hypokalemia: Secondary | ICD-10-CM | POA: Diagnosis not present

## 2020-08-31 DIAGNOSIS — R519 Headache, unspecified: Secondary | ICD-10-CM

## 2020-08-31 DIAGNOSIS — G8929 Other chronic pain: Secondary | ICD-10-CM | POA: Diagnosis not present

## 2020-08-31 DIAGNOSIS — Z1231 Encounter for screening mammogram for malignant neoplasm of breast: Secondary | ICD-10-CM | POA: Diagnosis not present

## 2020-08-31 DIAGNOSIS — R002 Palpitations: Secondary | ICD-10-CM

## 2020-08-31 LAB — BASIC METABOLIC PANEL
BUN: 15 mg/dL (ref 6–23)
CO2: 32 mEq/L (ref 19–32)
Calcium: 9.2 mg/dL (ref 8.4–10.5)
Chloride: 104 mEq/L (ref 96–112)
Creatinine, Ser: 0.84 mg/dL (ref 0.40–1.20)
GFR: 68.23 mL/min (ref >60.00)
Glucose, Bld: 90 mg/dL (ref 70–99)
Potassium: 3.7 mEq/L (ref 3.5–5.1)
Sodium: 143 mEq/L (ref 135–145)

## 2020-08-31 MED ORDER — METOPROLOL SUCCINATE ER 25 MG PO TB24: 12.5000 mg | ORAL_TABLET | Freq: Every day | ORAL | 3 refills | Status: DC

## 2020-08-31 NOTE — Patient Instructions (Signed)
Good to see you today- I am sorry you are having so much trouble with headaches, I do hope the injections you are getting from neurology will help We are going to get you seen by orthopedics for your right shoulder pain Your BP is ok!    Please see me in 3 months I do recommend that you get your pneumonia vaccine, shingles vaccine and covid booster if not done yet !

## 2020-09-01 ENCOUNTER — Other Ambulatory Visit: Payer: Self-pay | Admitting: Interventional Cardiology

## 2020-09-01 DIAGNOSIS — K449 Diaphragmatic hernia without obstruction or gangrene: Secondary | ICD-10-CM | POA: Diagnosis not present

## 2020-09-01 DIAGNOSIS — K220 Achalasia of cardia: Secondary | ICD-10-CM | POA: Diagnosis not present

## 2020-09-01 DIAGNOSIS — K222 Esophageal obstruction: Secondary | ICD-10-CM | POA: Diagnosis not present

## 2020-09-01 DIAGNOSIS — I10 Essential (primary) hypertension: Secondary | ICD-10-CM | POA: Diagnosis not present

## 2020-09-01 DIAGNOSIS — Z79899 Other long term (current) drug therapy: Secondary | ICD-10-CM | POA: Diagnosis not present

## 2020-09-01 DIAGNOSIS — K219 Gastro-esophageal reflux disease without esophagitis: Secondary | ICD-10-CM | POA: Diagnosis not present

## 2020-09-01 DIAGNOSIS — I671 Cerebral aneurysm, nonruptured: Secondary | ICD-10-CM | POA: Diagnosis not present

## 2020-09-01 DIAGNOSIS — J45909 Unspecified asthma, uncomplicated: Secondary | ICD-10-CM | POA: Diagnosis not present

## 2020-09-01 DIAGNOSIS — Z87891 Personal history of nicotine dependence: Secondary | ICD-10-CM | POA: Diagnosis not present

## 2020-09-01 DIAGNOSIS — R1310 Dysphagia, unspecified: Secondary | ICD-10-CM | POA: Diagnosis not present

## 2020-09-01 DIAGNOSIS — E785 Hyperlipidemia, unspecified: Secondary | ICD-10-CM | POA: Diagnosis not present

## 2020-09-01 DIAGNOSIS — G43909 Migraine, unspecified, not intractable, without status migrainosus: Secondary | ICD-10-CM | POA: Diagnosis not present

## 2020-09-06 DIAGNOSIS — M542 Cervicalgia: Secondary | ICD-10-CM | POA: Diagnosis not present

## 2020-09-06 DIAGNOSIS — M545 Low back pain, unspecified: Secondary | ICD-10-CM | POA: Diagnosis not present

## 2020-09-06 DIAGNOSIS — M25511 Pain in right shoulder: Secondary | ICD-10-CM | POA: Diagnosis not present

## 2020-09-07 ENCOUNTER — Ambulatory Visit (INDEPENDENT_AMBULATORY_CARE_PROVIDER_SITE_OTHER): Payer: Medicare Other

## 2020-09-07 DIAGNOSIS — J309 Allergic rhinitis, unspecified: Secondary | ICD-10-CM

## 2020-09-14 ENCOUNTER — Ambulatory Visit (INDEPENDENT_AMBULATORY_CARE_PROVIDER_SITE_OTHER): Payer: Medicare Other

## 2020-09-14 DIAGNOSIS — I10 Essential (primary) hypertension: Secondary | ICD-10-CM

## 2020-09-14 DIAGNOSIS — U099 Post covid-19 condition, unspecified: Secondary | ICD-10-CM

## 2020-09-14 DIAGNOSIS — K220 Achalasia of cardia: Secondary | ICD-10-CM

## 2020-09-14 DIAGNOSIS — R0609 Other forms of dyspnea: Secondary | ICD-10-CM

## 2020-09-14 DIAGNOSIS — H43813 Vitreous degeneration, bilateral: Secondary | ICD-10-CM | POA: Diagnosis not present

## 2020-09-14 NOTE — Chronic Care Management (AMB) (Signed)
Chronic Care Management   CCM RN Visit Note  09/14/2020 Name: Stephanie Parks MRN: [id number] DOB: Jun 12, 1945  Subjective: Stephanie Parks is a 75 y.o. year old female who is a primary care patient of Copland, Gay Filler, MD. The care management team was consulted for assistance with disease management and care coordination needs.    Engaged with patient by telephone for follow up visit in response to provider referral for case management and/or care coordination services.   Consent to Services:  The patient was given information about Chronic Care Management services, agreed to services, and gave verbal consent prior to initiation of services.  Please see initial visit note for detailed documentation.   Patient agreed to services and verbal consent obtained.   Assessment: Review of patient past medical history, allergies, medications, health status, including review of consultants reports, laboratory and other test data, was performed as part of comprehensive evaluation and provision of chronic care management services.   SDOH (Social Determinants of Health) assessments and interventions performed:    CCM Care Plan  Allergies  Allergen Reactions   Shellfish Allergy Hives and Swelling   Ace Inhibitors Other (See Comments)    Pt cannot recall her reaction but tolerates arb    Gabapentin Other (See Comments)    headaches   Pitavastatin     Unknown reaction    Topiramate     Heart race,    Sulfa Antibiotics Other (See Comments) and Rash    Fine bumps    Outpatient Encounter Medications as of 09/14/2020  Medication Sig Note   acetaminophen (TYLENOL) 325 MG tablet Take 650 mg by mouth. For headaches    albuterol (PROAIR HFA) 108 (90 Base) MCG/ACT inhaler Inhale 2 puffs into the lungs every 4 (four) hours as needed for wheezing or shortness of breath.    ascorbic acid (VITAMIN C) 500 MG tablet Take 500 mg by mouth daily.    Calcium Carb-Cholecalciferol (CALCIUM 600 + D PO) Take 1  tablet by mouth daily.    ELDERBERRY PO Take 50 mg by mouth daily.    EPINEPHrine (EPIPEN 2-PAK) 0.3 mg/0.3 mL IJ SOAJ injection Inject 0.3 mLs (0.3 mg total) into the muscle as needed for anaphylaxis. Per allergen immunotherapy protocol    fluticasone (FLONASE) 50 MCG/ACT nasal spray USE 2 SPRAYS IN NOSTRIL(S) DAILY AS NEEDED.  In the right nostril point the applicator out toward your right ear.  In the left nostril point the applicator out toward her left ear (Patient taking differently: Place 2 sprays into both nostrils daily as needed for allergies.)    Galcanezumab-gnlm (EMGALITY) 120 MG/ML SOAJ Inject 120 mg into the skin every 30 (thirty) days.    losartan (COZAAR) 100 MG tablet Take 1 tablet (100 mg total) by mouth daily.    METAMUCIL FIBER PO Take by mouth. 09/14/2020: Reports takes every other day (powder form)   metoprolol succinate (TOPROL-XL) 25 MG 24 hr tablet Take 0.5 tablets (12.5 mg total) by mouth daily.    Multiple Vitamins-Minerals (MULTIVITAL) tablet Take 1 tablet by mouth daily.  08/17/2020: Alive vit   omeprazole (PRILOSEC) 40 MG capsule Take 1 capsule (40 mg total) by mouth in the morning and at bedtime.    potassium chloride SA (KLOR-CON M20) 20 MEQ tablet TAKE 2 TABLETS ONE DAY THEN 1 TABLET THE NEXT 07/04/2020: Take tablet daily, but Alternates to two tablets every other day.    rosuvastatin (CRESTOR) 10 MG tablet TAKE 1 TABLET BY MOUTH EVERY DAY (  Patient taking differently: Take 10 mg by mouth daily.)    UBRELVY 100 MG TABS TAKE 1 TABLET BY MOUTH AS NEEDED. CAN TAKE 1 ADDITIONAL TABLET IN 2 HOURS IF NEEDED AFTER INITIAL DOSE. NO MORE THAN 2 TABLETS IN 24 HOURS.    zinc gluconate 50 MG tablet Take 50 mg by mouth daily.    Specialty Vitamins Products (MAGNESIUM, AMINO ACID CHELATE,) 133 MG tablet Take 1 tablet by mouth daily. Dose unknown (Patient not taking: Reported on 09/14/2020)    No facility-administered encounter medications on file as of 09/14/2020.    Patient Active  Problem List   Diagnosis Date Noted   Headache 06/07/2020   Acute respiratory failure with hypoxia (HCC) 05/02/2020   Post-COVID chronic dyspnea 05/02/2020   Pulsatile neck mass 04/26/2020   Pneumonia due to COVID-19 virus 03/12/2020   Headache disorder 01/04/2020   Seasonal allergic conjunctivitis 10/04/2019   Dysfunction of right eustachian tube 10/04/2019   Pre-diabetes 06/02/2019   Seasonal and perennial allergic rhinitis 12/22/2018   Heart palpitations 12/22/2018   History of chest pain 07/20/2018   Brain aneurysm 07/20/2018   Dyslipidemia 05/04/2018   Bronchitis 04/23/2018   Tendonitis, Achilles, right 12/08/2017   Onychomycosis 12/08/2017   Unilateral primary osteoarthritis, left knee 10/07/2017   Ingrown toenail 08/10/2017   Coughing 04/24/2017   CAD (coronary artery disease) 03/01/2015   Aneurysm ([place]) 03/01/2015   Dizziness 03/01/2015   Paresthesia of arm 03/01/2015   Seasonal allergic rhinitis 03/01/2015   Achalasia 09/28/2012   CN (constipation) 09/28/2012   Cephalalgia 08/24/2012   Essential hypertension 04/19/2011   Hyperlipidemia 04/19/2011   GERD (gastroesophageal reflux disease) 04/19/2011   Chest pain 04/04/2011    Conditions to be addressed/monitored:HTN and OSA on CPAP, Achalasia/GERD, Constipation  Care Plan : Post Covid Pneumonia  Updates made by Luretha Rued, RN since 09/14/2020 12:00 AM     Problem: Decrease in Activity and Endurance 2 months Covid   Priority: Medium     Long-Range Goal: Development of long term plan for managment of ongoing symptoms post covid pneumonia in a patient with OSA, chronic seasonal allergies, deviated nasal septum   Start Date: 05/08/2020  Expected End Date: 10/15/2020  This Visit's Progress: On track  Recent Progress: On track  Priority: Medium  Note:   Current Barriers:  Knowledge Deficits related to long term plan for management for ongoing signs/symptoms post Covid pneumonia . Client reports breathing is  "ok". Still continues with pulmonary rehab schedule, but reports has been unable to attend the past week due to migraine.    Nurse Case Manager Clinical Goal(s):  patient will verbalize understanding of plan for improvement of over time, improve ability to use energy conservation and management techniques. patient will work with pulmonary rehab to address needs related to increase mobility and endurance patient will attend all scheduled medical appointments the patient will demonstrate ongoing self health care management ability as evidenced by continuing to attend provider visits, taking medications as recommended, maintaining respiratory and cardiovascular functions during activities. Interventions:  1:1 collaboration with Copland, Gay Filler, MD regarding development and update of comprehensive plan of care as evidenced by provider attestation and co-signature Inter-disciplinary care team collaboration (see longitudinal plan of care) Medications reviewed with client Advised patient to continue self care management strategies: take medications as prescribed; attend provider visits; call provider for questions or concerns. Reviewed scheduled/upcoming provider appointments. Provided positive feedback regarding self care activities Discussed plans with patient for ongoing care management follow up and  provided patient with direct contact information for care management team Patient Goals/Self-Care Activities Discuss continue activity with pulmonary rehab. Continue to take medications as prescribed.  Continue to attend scheduled provider appointments. Continue to Eat Healthy meal plan: foods low in salt and heart healthy meals full of fruits, vegetables, whole grains, lean protein and limit fat, and sugars. It may also help to eat smaller portions 5-6 times a day.  Call your provider if you have any questions or concerns. Follow Up Plan: Telephone follow up appointment with care management team  member scheduled for: next month The patient has been provided with contact information for the care management team and has been advised to call with any health related questions or concerns.        Care Plan : Hypertension (Adult)  Updates made by Luretha Rued, RN since 09/14/2020 12:00 AM     Problem: Disease Progression (Hypertension)      Long-Range Goal: Disease Progression Prevented or Minimized   Start Date: 05/08/2020  Expected End Date: 08/06/2020  This Visit's Progress: On track  Recent Progress: On track  Priority: Medium  Note:   Objective:  Last practice recorded BP readings:  BP Readings from Last 3 Encounters:  08/31/20 (!) 144/62  08/17/20 119/62  07/24/20 (!) 156/70  Most recent eGFR/CrCl:  Lab Results  Component Value Date   EGFR 80 05/15/2020  Current Barriers:  Knowledge Deficits related to long term self-management of hypertension. Reports currently having issue with migraine. Office visit with neurologist on 08/17/20-prescribed Emgality and Ajovy. She states next dose of Emgality is due Sunday. Reports has not been going to cardiac rehab the past week due to migraine. She reports migraine is a "5" on the pain scale at this time. She reports she has both medications now, but she does not know if she can continue to afford the medication. She states she has an appointment with her neuro surgeon for additional recommendations due to history of brain aneurysm. She is also followed by Gastroenterology re: Achalasia/GERD and constipation. Endoscopy completed on 09/01/20. Client does not know the report and will call GI provider office to discuss. Client reports per recommendation of her PCP she will check blood pressure periodically to minimize anxiety over her readings. She continues to monitor weight and states her weight today is 191 pounds down 3 lbs since 07/04/20.  Case Manager Clinical Goal(s):  patient will verbalize understanding of plan for hypertension  management patient will attend all scheduled medical appointments patient will demonstrate improved adherence to prescribed treatment plan for hypertension as evidenced by taking all medications as prescribed, monitoring and recording blood pressure as directed, adhering to low sodium/DASH diet; taking medications as prescribed; knowing target parameters for blood pressure and calling provider with questions/concerns as needed. Interventions:  Collaboration with Copland, Gay Filler, MD regarding development and update of comprehensive plan of care as evidenced by provider attestation and co-signature Inter-disciplinary care team collaboration (see longitudinal plan of care) Encouraged to continue to monitor salt intake Reviewed medications with patient and encouraged to continue taking as prescribed. Reviewed scheduled/upcoming provider appointments. Discussed blood pressure readings Pharmacy referral for medication assistance: patient does not know how long she will be able to afford Emgality andAjovy. She reports she has used Linzess in the past and adding it worked before, but had difficulty affording the medication. Discussed plans with patient for ongoing care management follow up and provided patient with direct contact information for care management team Self-Care Activities: Take medications  as prescribed Attends scheduled provider appointments Calls provider office for new concerns, questions Monitors Blood pressure-taken at provider appointment. Patient Goals: continue to check blood pressure periodically as recommended by provider and take log to provider appointments. attend provider visits as scheduled continue your plan to eat healthy: low salt and heart healthy meals with fruits, vegetables, whole grains, lean protein and limit fats and sugars. continue attending cardiac rehab therapy sessions per provider recommendations.   Call your Gastroenterologist(GI) office to obtain  report from your recent procedures. Continue to take medications as prescribed Collaborate with clinic pharmacist regarding medications Follow Up Plan: The patient has been provided with contact information for the care management team and has been advised to call with any health related questions or concerns.  The care management team will reach out to the patient again next month.       Plan:Telephone follow up appointment with care management team member scheduled for:  next month and The patient has been provided with contact information for the care management team and has been advised to call with any health related questions or concerns.   Thea Silversmith, RN, MSN, BSN, CCM Care Management Coordinator [facility] [phone]

## 2020-09-14 NOTE — Patient Instructions (Addendum)
Visit Information  PATIENT GOALS:  Goals Addressed             This Visit's Progress    Track and Manage My Blood Pressure-Hypertension       Timeframe:  Long-Range Goal Priority:  Medium Start Date:  05/08/20                           Expected End Date:  03/17/21                    Follow Up Date 10/16/20  Patient Goals: continue to check blood pressure periodically as recommended by provider and take log to provider appointments. attend provider visits as scheduled continue your plan to eat healthy: low salt and heart healthy meals with fruits, vegetables, whole grains, lean protein and limit fats and sugars. continue attending cardiac rehab therapy sessions per provider recommendations.   Call your Gastroenterologist(GI) office to obtain report from your recent procedures. Continue to take medications as prescribed  Collaborate with clinic pharmacist regarding medications Review education regarding migraines. Plan to discuss at next telephone call      Migraine Headache A migraine headache is an intense, throbbing pain on one side or both sides of the head. Migraine headaches may also cause other symptoms, such as nausea, vomiting, and sensitivity to light and noise. A migraine headache can last from 4 hours to 3 days. Talk with your doctor about what things may bring on (trigger) your migraine headaches. What are the causes? The exact cause of this condition is not known. However, a migraine may be caused when nerves in the brain become irritated and release chemicals that cause inflammation of blood vessels. This inflammation causes pain. This condition may be triggered or caused by: Drinking alcohol. Smoking. Taking medicines, such as: Medicine used to treat chest pain (nitroglycerin). Birth control pills. Estrogen. Certain blood pressure medicines. Eating or drinking products that contain nitrates, glutamate, aspartame, or tyramine. Aged cheeses, chocolate, or caffeine  may also be triggers. Doing physical activity. Other things that may trigger a migraine headache include: Menstruation. Pregnancy. Hunger. Stress. Lack of sleep or too much sleep. Weather changes. Fatigue. What increases the risk? The following factors may make you more likely to experience migraine headaches: Being a certain age. This condition is more common in people who are 30-74 years old. Being female. Having a family history of migraine headaches. Being Caucasian. Having a mental health condition, such as depression or anxiety. Being obese. What are the signs or symptoms? The main symptom of this condition is pulsating or throbbing pain. This pain may: Happen in any area of the head, such as on one side or both sides. Interfere with daily activities. Get worse with physical activity. Get worse with exposure to bright lights or loud noises. Other symptoms may include: Nausea. Vomiting. Dizziness. General sensitivity to bright lights, loud noises, or smells. Before you get a migraine headache, you may get warning signs (an aura). An aura may include: Seeing flashing lights or having blind spots. Seeing bright spots, halos, or zigzag lines. Having tunnel vision or blurred vision. Having numbness or a tingling feeling. Having trouble talking. Having muscle weakness. Some people have symptoms after a migraine headache (postdromal phase), such as: Feeling tired. Difficulty concentrating. How is this diagnosed? A migraine headache can be diagnosed based on: Your symptoms. A physical exam. Tests, such as: CT scan or an MRI of the head. These imaging tests  can help rule out other causes of headaches. Taking fluid from the spine (lumbar puncture) and analyzing it (cerebrospinal fluid analysis, or CSF analysis). How is this treated? This condition may be treated with medicines that: Relieve pain. Relieve nausea. Prevent migraine headaches. Treatment for this condition  may also include: Acupuncture. Lifestyle changes like avoiding foods that trigger migraine headaches. Biofeedback. Cognitive behavioral therapy. Follow these instructions at home: Medicines Take over-the-counter and prescription medicines only as told by your health care provider. Ask your health care provider if the medicine prescribed to you: Requires you to avoid driving or using heavy machinery. Can cause constipation. You may need to take these actions to prevent or treat constipation: Drink enough fluid to keep your urine pale yellow. Take over-the-counter or prescription medicines. Eat foods that are high in fiber, such as beans, whole grains, and fresh fruits and vegetables. Limit foods that are high in fat and processed sugars, such as fried or sweet foods. Lifestyle Do not drink alcohol. Do not use any products that contain nicotine or tobacco, such as cigarettes, e-cigarettes, and chewing tobacco. If you need help quitting, ask your health care provider. Get at least 8 hours of sleep every night. Find ways to manage stress, such as meditation, deep breathing, or yoga. General instructions     Keep a journal to find out what may trigger your migraine headaches. For example, write down: What you eat and drink. How much sleep you get. Any change to your diet or medicines. If you have a migraine headache: Avoid things that make your symptoms worse, such as bright lights. It may help to lie down in a dark, quiet room. Do not drive or use heavy machinery. Ask your health care provider what activities are safe for you while you are experiencing symptoms. Keep all follow-up visits as told by your health care provider. This is important. Contact a health care provider if: You develop symptoms that are different or more severe than your usual migraine headache symptoms. You have more than 15 headache days in one month. Get help right away if: Your migraine headache becomes  severe. Your migraine headache lasts longer than 72 hours. You have a fever. You have a stiff neck. You have vision loss. Your muscles feel weak or like you cannot control them. You start to lose your balance often. You have trouble walking. You faint. You have a seizure. Summary A migraine headache is an intense, throbbing pain on one side or both sides of the head. Migraines may also cause other symptoms, such as nausea, vomiting, and sensitivity to light and noise. This condition may be treated with medicines and lifestyle changes. You may also need to avoid certain things that trigger a migraine headache. Keep a journal to find out what may trigger your migraine headaches. Contact your health care provider if you have more than 15 headache days in a month or you develop symptoms that are different or more severe than your usual migraine headache symptoms. This information is not intended to replace advice given to you by your health care provider. Make sure you discuss any questions you have with your healthcare provider. Document Revised: 05/29/2018 Document Reviewed: 03/19/2018 Elsevier Patient Education  2022 [place].   Patient verbalizes understanding of instructions provided today and agrees to view in [place].   Telephone follow up appointment with care management team member scheduled for: 10/16/20 The patient has been provided with contact information for the care management team and has been  advised to call with any health related questions or concerns.   Thea Silversmith, RN, MSN, BSN, CCM Care Management Coordinator [facility] [phone]

## 2020-09-21 DIAGNOSIS — M25552 Pain in left hip: Secondary | ICD-10-CM | POA: Diagnosis not present

## 2020-09-21 DIAGNOSIS — M4802 Spinal stenosis, cervical region: Secondary | ICD-10-CM | POA: Diagnosis not present

## 2020-09-21 DIAGNOSIS — M25511 Pain in right shoulder: Secondary | ICD-10-CM | POA: Diagnosis not present

## 2020-09-24 ENCOUNTER — Other Ambulatory Visit: Payer: Self-pay | Admitting: Internal Medicine

## 2020-09-25 ENCOUNTER — Other Ambulatory Visit: Payer: Self-pay | Admitting: Neurosurgery

## 2020-09-25 DIAGNOSIS — M25511 Pain in right shoulder: Secondary | ICD-10-CM

## 2020-09-27 ENCOUNTER — Ambulatory Visit (INDEPENDENT_AMBULATORY_CARE_PROVIDER_SITE_OTHER): Payer: Medicare Other | Admitting: Orthopaedic Surgery

## 2020-09-27 ENCOUNTER — Other Ambulatory Visit: Payer: Self-pay

## 2020-09-27 ENCOUNTER — Encounter: Payer: Self-pay | Admitting: Orthopaedic Surgery

## 2020-09-27 ENCOUNTER — Ambulatory Visit: Payer: Self-pay

## 2020-09-27 VITALS — Ht 65.5 in | Wt 195.0 lb

## 2020-09-27 DIAGNOSIS — M25552 Pain in left hip: Secondary | ICD-10-CM | POA: Diagnosis not present

## 2020-09-27 NOTE — Progress Notes (Signed)
Office Visit Note   Patient: Stephanie Parks           Date of Birth: [birth year]           MRN: [id number] Visit Date: 09/27/2020              Requested by: [name], MD [address] [place],  [place] [address] PCP: [name], MD   Assessment & Plan: Visit Diagnoses:  1. Pain in left hip     Plan: Based on findings my impression is that she has left hip DJD and as a result of the pain and altered gait she has probably flared up her trochanteric bursa.  My recommendation is to try an intra-articular injection to the left hip as I feel that this is the source of the problem and hopefully this will allow her to normalize her gait and allow the bursitis to calm down.  She does not feel any improvement from the injection I have instructed her to follow-up with me in a 2weeks to look at injecting the trochanteric bursa.  She already has a handicap placard and she has a cane which I recommended using on the opposite side for the next couple weeks until she starts to feel improvement.  Follow-Up Instructions: Return if symptoms worsen or fail to improve.   Orders:  No orders of the defined types were placed in this encounter.  No orders of the defined types were placed in this encounter.     Procedures: No procedures performed   Clinical Data: No additional findings.   Subjective: Chief Complaint  Patient presents with   Left Hip - Pain    Stephanie Parks is a very pleasant 75 year old female who comes in for evaluation of chronic left hip pain for several months without any injuries.  She is currently painting constant pain occasional groin pain.  She feels deep-seated left hip pain.  She has pain when she sleeps on the left side.  Denies any radicular symptoms or numbness and tingling.  She had x-rays couple months ago which showed moderately severe left hip DJD.   Review of Systems  Constitutional: Negative.   HENT: Negative.    Eyes: Negative.    Respiratory: Negative.    Cardiovascular: Negative.   Endocrine: Negative.   Musculoskeletal: Negative.   Neurological: Negative.   Hematological: Negative.   Psychiatric/Behavioral: Negative.    All other systems reviewed and are negative.   Objective: Vital Signs: Ht 5' 5.5" (1.664 m)   Wt 195 lb (88.5 kg)   BMI 31.96 kg/m   Physical Exam Vitals and nursing note reviewed.  Constitutional:      Appearance: She is well-developed.  HENT:     Head: Normocephalic and atraumatic.  Pulmonary:     Effort: Pulmonary effort is normal.  Abdominal:     Palpations: Abdomen is soft.  Musculoskeletal:     Cervical back: Neck supple.  Skin:    General: Skin is warm.     Capillary Refill: Capillary refill takes less than 2 seconds.  Neurological:     Mental Status: She is alert and oriented to person, place, and time.  Psychiatric:        Behavior: Behavior normal.        Thought Content: Thought content normal.        Judgment: Judgment normal.    Ortho Exam Left hip shows significant pain with attempted range of motion and logroll.  She  does have tenderness to the lateral aspect of the hip but she describes this as different pain than what she is primarily complaining of.  Negative sciatic tension signs.  Specialty Comments:  No specialty comments available.  Imaging: No results found.   PMFS History: Patient Active Problem List   Diagnosis Date Noted   Headache 06/07/2020   Acute respiratory failure with hypoxia (HCC) 05/02/2020   Post-COVID chronic dyspnea 05/02/2020   Pulsatile neck mass 04/26/2020   Pneumonia due to COVID-19 virus 03/12/2020   Headache disorder 01/04/2020   Seasonal allergic conjunctivitis 10/04/2019   Dysfunction of right eustachian tube 10/04/2019   Pre-diabetes 06/02/2019   Seasonal and perennial allergic rhinitis 12/22/2018   Heart palpitations 12/22/2018   History of chest pain 07/20/2018   Brain aneurysm 07/20/2018   Dyslipidemia  05/04/2018   Bronchitis 04/23/2018   Tendonitis, Achilles, right 12/08/2017   Onychomycosis 12/08/2017   Unilateral primary osteoarthritis, left knee 10/07/2017   Ingrown toenail 08/10/2017   Coughing 04/24/2017   CAD (coronary artery disease) 03/01/2015   Aneurysm ([place]) 03/01/2015   Dizziness 03/01/2015   Paresthesia of arm 03/01/2015   Seasonal allergic rhinitis 03/01/2015   Achalasia 09/28/2012   CN (constipation) 09/28/2012   Cephalalgia 08/24/2012   Essential hypertension 04/19/2011   Hyperlipidemia 04/19/2011   GERD (gastroesophageal reflux disease) 04/19/2011   Chest pain 04/04/2011   Past Medical History:  Diagnosis Date   3-vessel CAD 03/01/2015   Achalasia 09/28/2012   Allergic rhinitis 03/01/2015   Anemia    Aneurysm ([place]) 03/01/2015   BP (high blood pressure) 03/01/2015   Bronchitis 04/23/2018   Cephalalgia 08/24/2012   Overview:  ICD-10 cut over     Chest pain 04/04/2011   CN (constipation) 09/28/2012   Coughing 04/24/2017   Decreased potassium in the blood 03/01/2015   Diverticulosis    Dizziness 03/01/2015   GERD (gastroesophageal reflux disease)    Hiatal hernia    Hypercholesterolemia 03/01/2015   Hyperlipidemia    Hypertension    Ingrown toenail 08/10/2017   Inguinal hernia    Migraine headache    Onychomycosis 12/08/2017   Paresthesia of arm 03/01/2015   Schatzki's ring    Tendonitis, Achilles, right 12/08/2017   Unilateral primary osteoarthritis, left knee 10/07/2017    Family History  Problem Relation Age of Onset   Allergic rhinitis Sister    Diabetes Sister    Hypertension Sister    Heart attack Father 86   Heart disease Father    Stomach cancer Maternal Grandmother    Heart disease Mother    Colon cancer Neg Hx    Angioedema Neg Hx    Asthma Neg Hx    Eczema Neg Hx    Urticaria Neg Hx    Immunodeficiency Neg Hx    Breast cancer Neg Hx    Migraines Neg Hx    Headache Neg Hx     Past Surgical History:  Procedure Laterality Date   ABDOMINAL  HYSTERECTOMY     BACK SURGERY     x 3   CARPAL TUNNEL RELEASE     left wrist   CERVICAL SPINE SURGERY     HEMORRHOID SURGERY     Social History   Occupational History   Occupation: Surveyor, 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

## 2020-09-27 NOTE — Addendum Note (Signed)
Addended by: Marlyne Beards on: 09/27/2020 09:38 AM   Modules accepted: Orders

## 2020-09-27 NOTE — Progress Notes (Signed)
Subjective: Patient is here for ultrasound-guided intra-articular left hip injection.   Pain from DJD.  Objective:  Pain with IR.  Procedure: Ultrasound guided injection is preferred based studies that show increased duration, increased effect, greater accuracy, decreased procedural pain, increased response rate, and decreased cost with ultrasound guided versus blind injection.   Verbal informed consent obtained.  Time-out conducted.  Noted no overlying erythema, induration, or other signs of local infection. Ultrasound-guided left hip injection: After sterile prep with Betadine, injected 5 cc 1% lidocaine without epinephrine and 40 mg Depo-Medrol using a 22-gauge spinal needle, passing the needle through the iliofemoral ligament into the femoral head/neck junction.  Injectate seen filling joint capsule.  Good immediate relief.

## 2020-09-28 ENCOUNTER — Telehealth: Payer: Self-pay | Admitting: Orthopaedic Surgery

## 2020-09-28 ENCOUNTER — Other Ambulatory Visit: Payer: Self-pay | Admitting: Physician Assistant

## 2020-09-28 ENCOUNTER — Telehealth: Payer: Self-pay | Admitting: Family Medicine

## 2020-09-28 ENCOUNTER — Ambulatory Visit (INDEPENDENT_AMBULATORY_CARE_PROVIDER_SITE_OTHER): Payer: Medicare Other | Admitting: Pharmacist

## 2020-09-28 DIAGNOSIS — R519 Headache, unspecified: Secondary | ICD-10-CM

## 2020-09-28 DIAGNOSIS — E785 Hyperlipidemia, unspecified: Secondary | ICD-10-CM

## 2020-09-28 DIAGNOSIS — I25119 Atherosclerotic heart disease of native coronary artery with unspecified angina pectoris: Secondary | ICD-10-CM

## 2020-09-28 DIAGNOSIS — G8929 Other chronic pain: Secondary | ICD-10-CM

## 2020-09-28 DIAGNOSIS — I10 Essential (primary) hypertension: Secondary | ICD-10-CM

## 2020-09-28 DIAGNOSIS — M8580 Other specified disorders of bone density and structure, unspecified site: Secondary | ICD-10-CM

## 2020-09-28 MED ORDER — METHYLPREDNISOLONE 4 MG PO TBPK: ORAL_TABLET | ORAL | 0 refills | Status: DC

## 2020-09-28 MED ORDER — TRAMADOL HCL 50 MG PO TABS: 50.0000 mg | ORAL_TABLET | Freq: Two times a day (BID) | ORAL | 0 refills | Status: DC | PRN

## 2020-09-28 NOTE — Telephone Encounter (Signed)
Called the pt back. She has continued to suffer with migraines daily. She took Ajovy in the office visit but because insurance didn't cover it emgality was ordered and given 09/17/2020. Pt states that it is not helping. Educated that studies have shown that the engality can take up to 2-3 months before start to notice the benefit. I asked her if the ubrelvy helped. She said that she got 10 pills for 100$ and took her last one today and has not noticed any benefit. She currently is taking tylenol as well and has just been dealing with it. She states she "cant take this no more". Asked the patient had she tried a steroid dose pack before to try and break the cycle and she has taken steroids before but not for headache/migraine management.  Will talk with Amy and see what she recommends   Spoke with Amy and she recommends doing the steroid dose pack. She also states in addition to emgality we could consider restarting the medication she has tried before along with emgality.  Pt states that those caused headaches and she didn't want to add those on. I advised next steps would be botox. At this time the patient would like to see if the steroid dose pack helps

## 2020-09-28 NOTE — Telephone Encounter (Signed)
Pt called, having migraines everyday, Galcanezumab-gnlm (EMGALITY) 120 MG/ML SOAJ is not working. Would like a call from the nurse.

## 2020-09-28 NOTE — Telephone Encounter (Signed)
Pt states she got an injection yesterday. She states she is still in a lot of pain and would like some kind of medication   CB [phone]

## 2020-09-28 NOTE — Telephone Encounter (Signed)
Sent in tramadol

## 2020-10-01 NOTE — Chronic Care Management (AMB) (Signed)
Chronic Care Management Pharmacy Note  10/01/2020 Name:  Stephanie Parks MRN:  [id number] DOB:  Oct 11, 1945   Subjective: Stephanie Parks is an 75 y.o. year old female who is a primary patient of Copland, Gay Filler, MD.  The CCM team was consulted for assistance with disease management and care coordination needs.    Engaged with patient by telephone for initial visit in response to provider referral for pharmacy case management and/or care coordination services.   Consent to Services:  The patient was given the following information about Chronic Care Management services today, agreed to services, and gave verbal consent: 1. CCM service includes personalized support from designated clinical staff supervised by the primary care provider, including individualized plan of care and coordination with other care providers 2. 24/7 contact phone numbers for assistance for urgent and routine care needs. 3. Service will only be billed when office clinical staff spend 20 minutes or more in a month to coordinate care. 4. Only one practitioner may furnish and bill the service in a calendar month. 5.The patient may stop CCM services at any time (effective at the end of the month) by phone call to the office staff. 6. The patient will be responsible for cost sharing (co-pay) of up to 20% of the service fee (after annual deductible is met). Patient agreed to services and consent obtained.  Patient Care Team: Copland, Gay Filler, MD as PCP - General (Family Medicine) Jettie Booze, MD as PCP - Cardiology (Cardiology) Ashok Pall, MD as Consulting Physician (Neurosurgery) Luretha Rued, RN as Case Manager Cherre Robins, PharmD (Pharmacist)  Recent office visits: 08/31/2020 - PCP (Dr Lorelei Pont) Seen for anxiety and right shoulder pain; ordered mammogram; referred to ortho for shoulder pain  Recent consult visits: 09/27/2020 - Ortho (Dr Erlinda Parks) Left hip steriod injection. 09/15/2020 - GI Phone Call  - continued bloating and swallowing difficulty. Dr Oren Beckmann noted nothing significant to explain symptoms on EGD and manometry from 09/01/20. Recommended H.Pylori Breath test. Patient agreed with proceeding with HBT. 09/07/2020 - Immunotherapy with allergiest office 09/01/2020 - GI - esophageal manometry performed  Hospital visits: None in previous 6 months  Objective:  Lab Results  Component Value Date   CREATININE 0.84 08/31/2020   CREATININE 0.70 07/24/2020   CREATININE 0.77 07/24/2020    Lab Results  Component Value Date   HGBA1C 5.9 06/02/2019   Last diabetic Eye exam: No results found for: HMDIABEYEEXA  Last diabetic Foot exam: No results found for: HMDIABFOOTEX      Component Value Date/Time   CHOL 161 06/02/2019 1439   CHOL 176 07/28/2018 0756   TRIG 98 03/12/2020 1503   HDL 56.00 06/02/2019 1439   HDL 73 07/28/2018 0756   CHOLHDL 3 06/02/2019 1439   VLDL 17.2 06/02/2019 1439   LDLCALC 87 06/02/2019 1439   LDLCALC 93 07/28/2018 0756    Hepatic Function Latest Ref Rng & Units 07/24/2020 07/10/2020 06/14/2020  Total Protein 6.5 - 8.1 g/dL 6.6 6.9 7.4  Albumin 3.5 - 5.0 g/dL 3.3(L) 3.3(L) 3.7  AST 15 - 41 U/L 19 23 19   ALT 0 - 44 U/L 12 12 12   Alk Phosphatase 38 - 126 U/L 59 61 59  Total Bilirubin 0.3 - 1.2 mg/dL 0.4 0.5 0.5  Bilirubin, Direct 0.00 - 0.40 mg/dL - - -    Lab Results  Component Value Date/Time   TSH 3.80 03/08/2020 04:31 PM   TSH 2.64 11/10/2019 02:36 PM    CBC Latest  Ref Rng & Units 07/24/2020 07/24/2020 07/10/2020  WBC 4.0 - 10.5 K/uL - 6.7 5.9  Hemoglobin 12.0 - 15.0 g/dL 11.6(L) 11.2(L) 11.4(L)  Hematocrit 36.0 - 46.0 % 34.0(L) 34.5(L) 35.0(L)  Platelets 150 - 400 K/uL - 311 271    Lab Results  Component Value Date/Time   VD25OH 36.70 09/26/2016 10:59 AM    Clinical ASCVD: Yes  The ASCVD Risk score [name], et al., 2013) failed to calculate for the following reasons:   The patient has a prior MI or stroke diagnosis     Social History    Tobacco Use  Smoking Status Former   Packs/day: 0.50   Years: 20.00   Pack years: 10.00   Types: Cigarettes   Quit date: 02/19/1984   Years since quitting: 36.6  Smokeless Tobacco Never   BP Readings from Last 3 Encounters:  08/31/20 (!) 144/62  08/17/20 119/62  07/24/20 (!) 156/70   Pulse Readings from Last 3 Encounters:  08/31/20 78  08/17/20 67  07/24/20 (!) 53   Wt Readings from Last 3 Encounters:  09/27/20 195 lb (88.5 kg)  08/31/20 191 lb 3.2 oz (86.7 kg)  08/17/20 191 lb 12.8 oz (87 kg)    Assessment: Review of patient past medical history, allergies, medications, health status, including review of consultants reports, laboratory and other test data, was performed as part of comprehensive evaluation and provision of chronic care management services.   SDOH:  (Social Determinants of Health) assessments and interventions performed:  SDOH Interventions    Flowsheet Row Most Recent Value  SDOH Interventions   Financial Strain Interventions Other (Comment)  [Working with patient and provider to identify alternative treatments or sources of assistance for medication cost.]  Physical Activity Interventions Other (Comments)  [Exercise currently limited by daily migraine headaches]       CCM Care Plan  Allergies  Allergen Reactions   Shellfish Allergy Hives and Swelling   Ace Inhibitors Other (See Comments)    Pt cannot recall her reaction but tolerates arb    Gabapentin Other (See Comments)    headaches   Pitavastatin     Unknown reaction    Topiramate     Heart race,    Sulfa Antibiotics Other (See Comments) and Rash    Fine bumps    Medications Reviewed Today     Reviewed by Cherre Robins, PharmD (Pharmacist) on 09/28/20 at 1644  Med List Status: <None>   Medication Order Taking? Sig Documenting Provider Last Dose Status Informant  acetaminophen (TYLENOL) 325 MG tablet [id number] Yes Take 650 mg by mouth. For headaches [provider] Taking  Active Self  albuterol (PROAIR HFA) 108 (90 Base) MCG/ACT inhaler [id number] No Inhale 2 puffs into the lungs every 4 (four) hours as needed for wheezing or shortness of breath.  Patient not taking: Reported on 09/28/2020   Althea Charon, FNP Not Taking Active   Calcium Carb-Cholecalciferol (CALCIUM 600 + D PO) [id number] Yes Take 1 tablet by mouth daily. [provider] Taking Active Self  ELDERBERRY PO [id number] Yes Take 50 mg by mouth daily. Also contains zinc and vitamin C [provider] Taking Active Self  EPINEPHrine (EPIPEN 2-PAK) 0.3 mg/0.3 mL IJ SOAJ injection [id number] Yes Inject 0.3 mLs (0.3 mg total) into the muscle as needed for anaphylaxis. Per allergen immunotherapy protocol Ambs, Kathrine Cords, FNP Taking Active Self           Med Note Alesia Banda, Harriette Bouillon Mar 13, 2020  12:35 PM)    fluticasone (FLONASE) 50 MCG/ACT nasal spray [id number] Yes USE 2 SPRAYS IN NOSTRIL(S) DAILY AS NEEDED.  In the right nostril point the applicator out toward your right ear.  In the left nostril point the applicator out toward her left ear  Patient taking differently: Place 2 sprays into both nostrils daily as needed for allergies.   Dara Hoyer, FNP Taking Active            Med Note Vita Barley Mar 13, 2020 12:32 PM)    Donah Driver [facility]) 120 MG/ML Darden Palmer [id number] Yes Inject 120 mg into the skin every 30 (thirty) days. Melvenia Beam, MD Taking Active   losartan (COZAAR) 100 MG tablet [id number] Yes Take 1 tablet (100 mg total) by mouth daily. Jettie Booze, MD Taking Active   METAMUCIL FIBER PO [id number] Yes Take by mouth. [provider] Taking Active            Med Note Juleen China, Jodi Marble Sep 14, 2020  2:47 PM) Reports takes every other day (powder form)  methylPREDNISolone (MEDROL DOSEPAK) 4 MG TBPK tablet [id number]  Take as prescribed Lomax, Amy, NP  Active   metoprolol succinate (TOPROL-XL) 25 MG 24 hr tablet [id number] Yes Take 0.5  tablets (12.5 mg total) by mouth daily. Copland, Gay Filler, MD Taking Active   Multiple Vitamins-Minerals (MULTIVITAL) tablet [id number] Yes Take 1 tablet by mouth daily.  [provider] Taking Active Self           Med Note Jacobo Forest, Zara Council Aug 17, 2020 11:19 AM) Alive vit  omeprazole (PRILOSEC) 40 MG capsule [id number] Yes Take 1 capsule (40 mg total) by mouth in the morning and at bedtime. Jerene Bears, MD Taking Active   potassium chloride SA (KLOR-CON M20) 20 MEQ tablet [id number] Yes TAKE 2 TABLETS ONE DAY THEN 1 TABLET THE NEXT Copland, Gay Filler, MD Taking Active            Med Note Antony Contras, Lynelle Smoke B   Thu Sep 28, 2020  3:40 PM)    rosuvastatin (CRESTOR) 10 MG tablet [id number] Yes TAKE 1 TABLET BY MOUTH EVERY DAY  Patient taking differently: Take 10 mg by mouth daily.   Copland, Gay Filler, MD Taking Active            Med Note Vita Barley Mar 13, 2020 12:35 PM)    Specialty Vitamins Products (MAGNESIUM, AMINO ACID CHELATE,) 133 MG tablet [id number] Yes Take 1 tablet by mouth daily. Dose unknown [provider] Taking Active   UBRELVY 100 MG TABS [id number] Yes TAKE 1 TABLET BY MOUTH AS NEEDED. CAN TAKE 1 ADDITIONAL TABLET IN 2 HOURS IF NEEDED AFTER INITIAL DOSE. NO MORE THAN 2 TABLETS IN 24 HOURS. Melvenia Beam, MD Taking Active   Med List Note Lorelei Pont, Gay Filler, MD 04/06/20 1050):              Patient Active Problem List   Diagnosis Date Noted   Headache 06/07/2020   Acute respiratory failure with hypoxia ([place]) 05/02/2020   Post-COVID chronic dyspnea 05/02/2020   Pulsatile neck mass 04/26/2020   Pneumonia due to COVID-19 virus 03/12/2020   Headache disorder 01/04/2020   Seasonal allergic conjunctivitis 10/04/2019   Dysfunction of right eustachian tube 10/04/2019   Pre-diabetes 06/02/2019   Seasonal and perennial allergic rhinitis 12/22/2018   Heart palpitations 12/22/2018   History of chest  pain 07/20/2018   Brain aneurysm 07/20/2018    Dyslipidemia 05/04/2018   Bronchitis 04/23/2018   Tendonitis, Achilles, right 12/08/2017   Onychomycosis 12/08/2017   Unilateral primary osteoarthritis, left knee 10/07/2017   Ingrown toenail 08/10/2017   Coughing 04/24/2017   CAD (coronary artery disease) 03/01/2015   Aneurysm ([place]) 03/01/2015   Dizziness 03/01/2015   Paresthesia of arm 03/01/2015   Seasonal allergic rhinitis 03/01/2015   Achalasia 09/28/2012   CN (constipation) 09/28/2012   Cephalalgia 08/24/2012   Essential hypertension 04/19/2011   Hyperlipidemia 04/19/2011   GERD (gastroesophageal reflux disease) 04/19/2011   Chest pain 04/04/2011    Immunization History  Administered Date(s) Administered   PFIZER(Purple Top)SARS-COV-2 Vaccination 06/07/2019, 06/30/2019    Conditions to be addressed/monitored: CAD, HTN, HLD, Anxiety, and migraine headache; achalasia; GERD; OA; pre DM  Care Plan : General Pharmacy (Adult)  Updates made by Cherre Robins, PHARMD since 10/01/2020 12:00 AM     Problem: Chronic Headaches, Aneurysm; CAD; HDL; HTN; bronchitis; achalasia; GERD; anxiety; chronic constipation; OA   Priority: High  Onset Date: 09/28/2020     Long-Range Goal: Medication and chronic conditions management   Start Date: 09/28/2020  Priority: High  Note:   Current Barriers:  Unable to independently afford treatment regimen Unable to achieve control of migraine headaches or HTN   Pharmacist Clinical Goal(s):  Over the next 90 days, patient will verbalize ability to afford treatment regimen achieve adherence to monitoring guidelines and medication adherence to achieve therapeutic efficacy achieve control of migraine headaches as evidenced by decreasing frequency of migraine headaches maintain control of HTN as evidenced by BP <140/90  through collaboration with PharmD and provider.   Interventions: 1:1 collaboration with Copland, Gay Filler, MD regarding development and update of comprehensive plan of care as  evidenced by provider attestation and co-signature Inter-disciplinary care team collaboration (see longitudinal plan of care) Comprehensive medication review performed; medication list updated in electronic medical record  Migraine Headaches:  Uncontrolled; goal: decrease frequency of headaches to less than 15 days per month Managed by neurology - Amy Lomax Current Therapy:  Emgality - received first injection 09/17/2020 Ubrelvy 131m - take 1 tablet as needed for headache. May take an additional tablet if needed in 2 hours. No more than 2 tablets in 24 hours Previous therapies: topiramate, nortriptyline, gabapentin, Ajovy (08/17/2020 - [facility]  Patient reports she has migraines several years ago but had been rare until May 2022. She feels like return of migraine headaches is linked to COVID infection from 03/2020 She has not seen any improvement in headache frequency since starting Emgality.  She is also concerned with cost of Emgality - $100 per month Ubrelvy helps for a few hours but headache returns quickly and she is unable to take every day. She is starting to feel depressed. During our phone visit patient had call from neurology office. Neuro started Medrol dose pack.  Interventions:  Reviewed patient's formulary / called insurance - [facility] [place] N[place] and [place] all non formulary (but called insurance and could ask for coverage determination if she has tried but failed other formulary medications Emgality and Aimovig are tier 4 - $100 / 30 days I did review other possible ways to help with cost of these medications Looked to see if [place]fr[place] open however it is currently closed. Checked with her pharmacy. They did a test claim and next Emgality will cost $213.59 - patient will enter  Medicare coverage with next refill.  If patient is to stay on Emgality, could apply for patient assistance  through the [employer]. Also is assistance available for Nurtec.  WIll send patient information about above therapies.  Explained that it might take 3 to 4 months to see the full effects of Emgality  Hypertension: Uncontrolled per last 3 office BP readings; Possibly related to pain / headaches.  BP goal <140/90  BP Readings from Last 3 Encounters:  08/31/20 (!) 144/62  08/17/20 119/62  07/24/20 (!) 156/70  Current treatment: Losartan 150m daily Metoprolol succinate 275mER - take 0.5 tablets daily (also helps with palpitations)  Current home readings: 143/68 this morning and HR 68 Having daily headaches Interventions:  Discussed blood pressure goals Continue to check blood pressure and heart rate daily, record and bring to future appointments  Hyperlipidemia / CAD: No recent LDL but patient is not moderate to high intensity statin with CAD.  Current treatment:rosuvastatin 1091maily Interventions:  Continue current regimen Consider checking lipid panel with next labs  Chronic constipation:  Controlled Current treatment:  Linzess 83m32maily Previous therapies: Amitiza 8mcg73mpatient could not remember why stopped Interventions:  [place] are tier 3 or $47/month. Patient will enter Medicare coverage gap soon.  Mailed application for Linzess patient assistance.   Osteopenia:  Last DEXA 09/12/2016 T-Score for Left femur nek = -1.2 T-Score for AP Spine = + 1.3 FRAX = 3.9% for osteoporotic fracture / 0.5% hip fracture Current Therapy:  Calcium + D - 500mg 41my Previous therapy: alendronate Interventions:  Continue to take calcium + D daily - goal is to get 1200mg o98mlcium daily from supplement and diet Vitamin D goal is to get 1000 IU daily Fall prevention Consider recheck DEXA  Patient Goals/Self-Care Activities Over the next 90 days, patient will:  take medications as prescribed, check blood  pressure daily, document, and provide at future appointments, collaborate with provider on medication access solutions, and work with neurologist regarding migraine headaches   Follow Up Plan: Telephone follow up appointment with care management team member scheduled for:  2 weeks          Medication Assistance: Application for Emgality and Linzess  medication assistance program. in process.  Anticipated assistance start date 10/19/2020.  See plan of care for additional detail.  Patient's preferred pharmacy is:  [id number] - [id number], [place] [phone] Up:  Patient agrees to Care Plan and Follow-up.  Plan: Telephone follow up appointment with care management team member scheduled for:  2 to 3 weeks  Blandon Offerdahl EcCherre Robins Clinical Pharmacist [facility]

## 2020-10-01 NOTE — Patient Instructions (Signed)
Visit Information  PATIENT GOALS:  Goals Addressed             This Visit's Progress    Pharmcy Chronic Care Plan       Current Barriers:  Unable to independently afford treatment regimen Unable to achieve control of migraine headaches or HTN   Pharmacist Clinical Goal(s):  Over the next 90 days, patient will verbalize ability to afford treatment regimen achieve adherence to monitoring guidelines and medication adherence to achieve therapeutic efficacy achieve control of migraine headaches as evidenced by decreasing frequency of migraine headaches maintain control of HTN as evidenced by BP <140/90  through collaboration with PharmD and provider.   Interventions: 1:1 collaboration with Copland, Gay Filler, MD regarding development and update of comprehensive plan of care as evidenced by provider attestation and co-signature Inter-disciplinary care team collaboration (see longitudinal plan of care) Comprehensive medication review performed; medication list updated in electronic medical record  Migraine Headaches:  Goal: decrease frequency of headaches to less than 15 days per month Managed by neurology - Amy Lomax Current Therapy:  Emgality - received first injection 09/17/2020 Ubrelvy '100mg'$  - take 1 tablet as needed for headache. May take an additional tablet if needed in 2 hours. No more than 2 tablets in 24 hours During our phone visit patient had call from neurology office. Neuro started Medrol dose pack.  Interventions:  Reviewed patient's formulary / called insurance - [facility], [facility], Nurtec and [place] are all non formulary (but called insurance and could ask for coverage determination if she has tried but failed other formulary medications) Emgality and Aimovig are tier 4 - $100 / 30 days Checked with her pharmacy. They did a test claim and next Emgality will cost $213.59 - patient will enter Medicare coverage with next refill.  If patient is to stay on  Emgality, could apply for patient assistance through the [employer]. Also assistance available for Nurtec.  WIll send patient information about above therapies.  Explained that it might take 3 to 4 months to see the full effects of Emgality  Hypertension: Uncontrolled per last 3 office BP readings; Possibly related to pain / headaches.  BP goal <140/90  BP Readings from Last 3 Encounters:  08/31/20 (!) 144/62  08/17/20 119/62  07/24/20 (!) 156/70  Current treatment: Losartan '100mg'$  daily Metoprolol succinate '25mg'$  ER - take 0.5 tablets daily (also helps with palpitations)  Interventions:  Discussed blood pressure goals Continue to check blood pressure and heart rate daily, record and bring to future appointments  Hyperlipidemia / CAD: No recent LDL but patient is taking moderate to high intensity statin. Current treatment:rosuvastatin '10mg'$  daily Interventions:  Continue current regimen Consider checking lipid panel with next labs  Chronic constipation:  Current treatment:  Linzess 31mg daily Previous therapies: Amitiza 854m - patient could not remember why stopped Interventions:  Re[place] generic Amitiza are tier 3 or $47/month. Patient will enter Medicare coverage gap soon.  Mailed application for Linzess patient assistance.   Osteopenia / low bone density:  Current Therapy:  Calcium + D - '500mg'$  daily Interventions:  Continue to take calcium + D daily - goal is to get '1200mg'$  of calcium daily from supplement and diet Vitamin D goal is to get 1000 IU daily Fall prevention Consider rechecking DEXA  Patient Goals/Self-Care Activities Over the next 90 days, patient will:  take medications as prescribed,  Check blood pressure daily, document, and provide at future appointments,  Collaborate  with provider on medication access solutions, and  Work with neurologist regarding migraine headaches   Follow Up Plan: Telephone  follow up appointment with care management team member scheduled for:  2 weeks          Patient verbalizes understanding of instructions provided today and agrees to view in [place].   Telephone follow up appointment with care management team member scheduled for: 2 to 3 weeks  Cherre Robins, PharmD Clinical Pharmacist [facility] Primary Care SW [facility]

## 2020-10-02 ENCOUNTER — Ambulatory Visit (HOSPITAL_BASED_OUTPATIENT_CLINIC_OR_DEPARTMENT_OTHER)
Admission: RE | Admit: 2020-10-02 | Discharge: 2020-10-02 | Disposition: A | Discharge status: Home / Self Care | Payer: Medicare Other | Source: Ambulatory Visit | Attending: Family Medicine | Admitting: Family Medicine

## 2020-10-02 ENCOUNTER — Other Ambulatory Visit: Payer: Self-pay

## 2020-10-02 ENCOUNTER — Encounter (HOSPITAL_BASED_OUTPATIENT_CLINIC_OR_DEPARTMENT_OTHER): Payer: Self-pay

## 2020-10-02 DIAGNOSIS — Z1231 Encounter for screening mammogram for malignant neoplasm of breast: Secondary | ICD-10-CM | POA: Diagnosis not present

## 2020-10-03 ENCOUNTER — Encounter: Payer: Self-pay | Admitting: Family Medicine

## 2020-10-03 ENCOUNTER — Ambulatory Visit (INDEPENDENT_AMBULATORY_CARE_PROVIDER_SITE_OTHER): Payer: Medicare Other | Admitting: Family Medicine

## 2020-10-03 VITALS — BP 150/60 | HR 72 | Ht 65.5 in | Wt 196.2 lb

## 2020-10-03 DIAGNOSIS — K219 Gastro-esophageal reflux disease without esophagitis: Secondary | ICD-10-CM | POA: Diagnosis not present

## 2020-10-03 DIAGNOSIS — R079 Chest pain, unspecified: Secondary | ICD-10-CM

## 2020-10-03 LAB — TROPONIN I: Troponin I: <3 ng/L (ref <=47)

## 2020-10-03 MED ORDER — DEXLANSOPRAZOLE 60 MG PO CPDR: 60.0000 mg | DELAYED_RELEASE_CAPSULE | Freq: Every day | ORAL | 5 refills | Status: DC

## 2020-10-03 NOTE — Assessment & Plan Note (Signed)
Change omeprazole to dexilant  Add pepcid and f/u GI

## 2020-10-03 NOTE — Progress Notes (Addendum)
Subjective:   By signing my name below, I, Shehryar Baig, attest that this documentation has been prepared under the direction and in the presence of Dr. Roma Schanz, DO. 10/03/2020      Patient ID: Stephanie Parks, female    DOB: Sep 12, 1945, 75 y.o.   MRN: [id number]  Chief Complaint  Patient presents with   Follow-up    From urgent care. From chest pain. Was it from acid reflux.    Headache    Still going on and sees neurology     HPI Patient is in today for a urgent care follow up.  She was admitted to an urgent care for complaints of chest pain. She was treated and told to get lab work completed somewhere else. She reports she did not have chest pain at the time of seeing the urgent care. She told the urgent care she was going to see the ED but instead made an appointment with her PCP to get her labs drawn here. She does not have chest pain at this time. She has a history of acid reflux and thinks her chest pain may be related to it but wanted to be sure. She takes 40 mg omeprazole 2x daily PO to manage her reflux symptoms. She reports having a endoscopy done last month.  She is taking 4 mg methylprednisolone until tomorrow to manage her arthritis.  Ekg was done at urgent care and is in care everywhere--- they compared it to an old one--- no change   Past Medical History:  Diagnosis Date   3-vessel CAD 03/01/2015   Achalasia 09/28/2012   Allergic rhinitis 03/01/2015   Anemia    Aneurysm ([place]) 03/01/2015   BP (high blood pressure) 03/01/2015   Bronchitis 04/23/2018   Cephalalgia 08/24/2012   Overview:  ICD-10 cut over     Chest pain 04/04/2011   CN (constipation) 09/28/2012   Coughing 04/24/2017   Decreased potassium in the blood 03/01/2015   Diverticulosis    Dizziness 03/01/2015   GERD (gastroesophageal reflux disease)    Hiatal hernia    Hypercholesterolemia 03/01/2015   Hyperlipidemia    Hypertension    Ingrown toenail 08/10/2017   Inguinal hernia    Migraine headache     Onychomycosis 12/08/2017   Paresthesia of arm 03/01/2015   Schatzki's ring    Tendonitis, Achilles, right 12/08/2017   Unilateral primary osteoarthritis, left knee 10/07/2017    Past Surgical History:  Procedure Laterality Date   ABDOMINAL HYSTERECTOMY     BACK SURGERY     x 3   CARPAL TUNNEL RELEASE     left wrist   CERVICAL SPINE SURGERY     HEMORRHOID SURGERY      Family History  Problem Relation Age of Onset   Allergic rhinitis Sister    Diabetes Sister    Hypertension Sister    Heart attack Father 64   Heart disease Father    Stomach cancer Maternal Grandmother    Heart disease Mother    Colon cancer Neg Hx    Angioedema Neg Hx    Asthma Neg Hx    Eczema Neg Hx    Urticaria Neg Hx    Immunodeficiency Neg Hx    Breast cancer Neg Hx    Migraines Neg Hx    Headache Neg Hx     Social History   Socioeconomic History   Marital status: Divorced    Spouse name: Not on file   Number of children: 2  Years of education: Not on file   Highest education level: Some college, no degree  Occupational History   Occupation: Surveyor, quantity: [id number]  Tobacco Use   Smoking status: Former    Packs/day: 0.50    Years: 20.00    Pack years: 10.00    Types: Cigarettes    Quit date: 02/19/1984    Years since quitting: 36.6   Smokeless tobacco: Never  Vaping Use   Vaping Use: Never used  Substance and Sexual Activity   Alcohol use: No   Drug use: No   Sexual activity: Yes    Partners: Male  Other Topics Concern   Not on file  Social History Narrative   Lives alone   Caffeine use: Coffee one cup daily   Right handed    Son is her next of kin   Working at [employer] Strain: Medium Risk   Difficulty of Paying Living Expenses: Somewhat hard  Food Insecurity: No Food Insecurity   Worried About Charity fundraiser in the Last Year: Never true   [place] in the Last Year: Never true  Transportation Needs:  No Transportation Needs   Lack of Transportation (Medical): No   Lack of Transportation (Non-Medical): No  Physical Activity: Inactive   Days of Exercise per Week: 0 days   Minutes of Exercise per Session: 0 min  Stress: Not on file  Social Connections: Not on file  Intimate Partner Violence: Not on file    Outpatient Medications Prior to Visit  Medication Sig Dispense Refill   acetaminophen (TYLENOL) 325 MG tablet Take 650 mg by mouth. For headaches     Calcium Carb-Cholecalciferol (CALCIUM 600 + D PO) Take 1 tablet by mouth daily.     ELDERBERRY PO Take 50 mg by mouth daily. Also contains zinc and vitamin C     EPINEPHrine (EPIPEN 2-PAK) 0.3 mg/0.3 mL IJ SOAJ injection Inject 0.3 mLs (0.3 mg total) into the muscle as needed for anaphylaxis. Per allergen immunotherapy protocol 1 each 2   fluticasone (FLONASE) 50 MCG/ACT nasal spray USE 2 SPRAYS IN NOSTRIL(S) DAILY AS NEEDED.  In the right nostril point the applicator out toward your right ear.  In the left nostril point the applicator out toward her left ear (Patient taking differently: Place 2 sprays into both nostrils daily as needed for allergies.) 16 g 9   Galcanezumab-gnlm (EMGALITY) 120 MG/ML SOAJ Inject 120 mg into the skin every 30 (thirty) days. 1.12 mL 11   losartan (COZAAR) 100 MG tablet Take 1 tablet (100 mg total) by mouth daily. 90 tablet 1   METAMUCIL FIBER PO Take by mouth.     methylPREDNISolone (MEDROL DOSEPAK) 4 MG TBPK tablet Take as prescribed 21 tablet 0   metoprolol succinate (TOPROL-XL) 25 MG 24 hr tablet Take 0.5 tablets (12.5 mg total) by mouth daily. 45 tablet 3   Multiple Vitamins-Minerals (MULTIVITAL) tablet Take 1 tablet by mouth daily.      potassium chloride SA (KLOR-CON M20) 20 MEQ tablet TAKE 2 TABLETS ONE DAY THEN 1 TABLET THE NEXT 180 tablet 1   rosuvastatin (CRESTOR) 10 MG tablet TAKE 1 TABLET BY MOUTH EVERY DAY (Patient taking differently: Take 10 mg by mouth daily.) 90 tablet 3   Specialty Vitamins  Products (MAGNESIUM, AMINO ACID CHELATE,) 133 MG tablet Take 1 tablet by mouth daily. Dose unknown     UBRELVY 100 MG TABS TAKE  1 TABLET BY MOUTH AS NEEDED. CAN TAKE 1 ADDITIONAL TABLET IN 2 HOURS IF NEEDED AFTER INITIAL DOSE. NO MORE THAN 2 TABLETS IN 24 HOURS. 10 tablet 5   omeprazole (PRILOSEC) 40 MG capsule Take 1 capsule (40 mg total) by mouth in the morning and at bedtime. 60 capsule 1   traMADol (ULTRAM) 50 MG tablet Take 1 tablet (50 mg total) by mouth 2 (two) times daily as needed. (Patient not taking: Reported on 10/03/2020) 30 tablet 0   albuterol (PROAIR HFA) 108 (90 Base) MCG/ACT inhaler Inhale 2 puffs into the lungs every 4 (four) hours as needed for wheezing or shortness of breath. (Patient not taking: No sig reported) 1 each 1   No facility-administered medications prior to visit.    Allergies  Allergen Reactions   Shellfish Allergy Hives and Swelling   Ace Inhibitors Other (See Comments)    Pt cannot recall her reaction but tolerates arb    Gabapentin Other (See Comments)    headaches   Pitavastatin     Unknown reaction    Topiramate     Heart race,    Sulfa Antibiotics Other (See Comments) and Rash    Fine bumps    Review of Systems  Constitutional:  Negative for fever and malaise/fatigue.  HENT:  Negative for congestion.   Eyes:  Negative for blurred vision.  Respiratory:  Negative for cough and shortness of breath.   Cardiovascular:  Negative for chest pain (Occasional), palpitations and leg swelling.  Gastrointestinal:  Negative for vomiting.  Musculoskeletal:  Negative for back pain.  Skin:  Negative for rash.  Neurological:  Negative for loss of consciousness and headaches.      Objective:    Physical Exam Vitals and nursing note reviewed.  Constitutional:      General: She is not in acute distress.    Appearance: Normal appearance. She is not ill-appearing.  HENT:     Head: Normocephalic and atraumatic.     Right Ear: External ear normal.     Left  Ear: External ear normal.  Eyes:     Extraocular Movements: Extraocular movements intact.     Pupils: Pupils are equal, round, and reactive to light.  Cardiovascular:     Rate and Rhythm: Normal rate and regular rhythm.     Heart sounds: Normal heart sounds. No murmur heard.   No gallop.  Pulmonary:     Effort: Pulmonary effort is normal. No respiratory distress.     Breath sounds: Normal breath sounds. No wheezing or rales.  Skin:    General: Skin is warm and dry.  Neurological:     Mental Status: She is alert and oriented to person, place, and time.  Psychiatric:        Behavior: Behavior normal.        Judgment: Judgment normal.    BP (!) 150/60   Pulse 72   Ht 5' 5.5" (1.664 m)   Wt 196 lb 3.2 oz (89 kg)   SpO2 98%   BMI 32.15 kg/m  Wt Readings from Last 3 Encounters:  10/03/20 196 lb 3.2 oz (89 kg)  09/27/20 195 lb (88.5 kg)  08/31/20 191 lb 3.2 oz (86.7 kg)    Diabetic Foot Exam - Simple   No data filed    Lab Results  Component Value Date   WBC 6.7 07/24/2020   HGB 11.6 (L) 07/24/2020   HCT 34.0 (L) 07/24/2020   PLT 311 07/24/2020   GLUCOSE 90 08/31/2020  CHOL 161 06/02/2019   TRIG 98 03/12/2020   HDL 56.00 06/02/2019   LDLCALC 87 06/02/2019   ALT 12 07/24/2020   AST 19 07/24/2020   NA 143 08/31/2020   K 3.7 08/31/2020   CL 104 08/31/2020   CREATININE 0.84 08/31/2020   BUN 15 08/31/2020   CO2 32 08/31/2020   TSH 3.80 03/08/2020   INR 1.0 07/24/2020   HGBA1C 5.9 06/02/2019    Lab Results  Component Value Date   TSH 3.80 03/08/2020   Lab Results  Component Value Date   WBC 6.7 07/24/2020   HGB 11.6 (L) 07/24/2020   HCT 34.0 (L) 07/24/2020   MCV 88.7 07/24/2020   PLT 311 07/24/2020   Lab Results  Component Value Date   NA 143 08/31/2020   K 3.7 08/31/2020   CO2 32 08/31/2020   GLUCOSE 90 08/31/2020   BUN 15 08/31/2020   CREATININE 0.84 08/31/2020   BILITOT 0.4 07/24/2020   ALKPHOS 59 07/24/2020   AST 19 07/24/2020   ALT 12  07/24/2020   PROT 6.6 07/24/2020   ALBUMIN 3.3 (L) 07/24/2020   CALCIUM 9.2 08/31/2020   ANIONGAP 8 07/24/2020   EGFR 80 05/15/2020   GFR 68.23 08/31/2020   Lab Results  Component Value Date   CHOL 161 06/02/2019   Lab Results  Component Value Date   HDL 56.00 06/02/2019   Lab Results  Component Value Date   LDLCALC 87 06/02/2019   Lab Results  Component Value Date   TRIG 98 03/12/2020   Lab Results  Component Value Date   CHOLHDL 3 06/02/2019   Lab Results  Component Value Date   HGBA1C 5.9 06/02/2019       Assessment & Plan:   Problem List Items Addressed This Visit       Unprioritized   Chest pain - Primary    Pt thinks it was her reflux  ekg at urgent showed no changes Check labs today No cp now---- pt was instructed to go to ER if cp returns       Relevant Orders   CBC with Differential/Platelet   Comprehensive metabolic panel   H. pylori antibody, IgG   DG Chest 2 View   Troponin I   GERD (gastroesophageal reflux disease)    Change omeprazole to dexilant  Add pepcid and f/u GI       Relevant Medications   dexlansoprazole (DEXILANT) 60 MG capsule     Meds ordered this encounter  Medications   dexlansoprazole (DEXILANT) 60 MG capsule    Sig: Take 1 capsule (60 mg total) by mouth daily.    Dispense:  30 capsule    Refill:  5    I, Dr. Roma Schanz, DO, personally preformed the services described in this documentation.  All medical record entries made by the scribe were at my direction and in my presence.  I have reviewed the chart and discharge instructions (if applicable) and agree that the record reflects my personal performance and is accurate and complete. 10/03/2020   I,Shehryar Baig,acting as a scribe for Ann Held, DO.,have documented all relevant documentation on the behalf of Ann Held, DO,as directed by  Ann Held, DO while in the presence of Ann Held, DO.   Ann Held, DO

## 2020-10-03 NOTE — Assessment & Plan Note (Signed)
Pt thinks it was her reflux  ekg at urgent showed no changes Check labs today No cp now---- pt was instructed to go to ER if cp returns

## 2020-10-03 NOTE — Patient Instructions (Signed)
Nonspecific Chest Pain, Adult Chest pain can be caused by many different conditions. It can be caused by a condition that is life-threatening and requires treatment right away. It can also be caused by something that is not life-threatening. If you have chest pain, it can be hard to know the difference, so it is important to get helpright away to make sure that you do not have a serious condition. Some life-threatening causes of chest pain include: Heart attack. A tear in the body's main blood vessel (aortic dissection). Inflammation around your heart (pericarditis). A problem in the lungs, such as a blood clot (pulmonary embolism) or a collapsed lung (pneumothorax). Some non life-threatening causes of chest pain include: Heartburn. Anxiety or stress. Damage to the bones, muscles, and cartilage that make up your chest wall. Pneumonia or bronchitis. Shingles infection (varicella-zoster virus). Chest pain can feel like: Pain or discomfort on the surface of your chest or deep in your chest. Crushing, pressure, aching, or squeezing pain. Burning or tingling. Dull or sharp pain that is worse when you move, cough, or take a deep breath. Pain or discomfort that is also felt in your back, neck, jaw, shoulder, or arm, or pain that spreads to any of these areas. Your chest pain may come and go. It may also be constant. Your health care provider will do lab tests and other studies to find the cause of your pain.Treatment will depend on the cause of your chest pain. Follow these instructions at home: Medicines Take over-the-counter and prescription medicines only as told by your health care provider. If you were prescribed an antibiotic, take it as told by your health care provider. Do not stop taking the antibiotic even if you start to feel better. Lifestyle  Rest as directed by your health care provider. Do not use any products that contain nicotine or tobacco, such as cigarettes and e-cigarettes.  If you need help quitting, ask your health care provider. Do not drink alcohol. Make healthy lifestyle choices as recommended. These may include: Getting regular exercise. Ask your health care provider to suggest some activities that are safe for you. Eating a heart-healthy diet. This includes plenty of fresh fruits and vegetables, whole grains, low-fat (lean) protein, and low-fat dairy products. A dietitian can help you find healthy eating options. Maintaining a healthy weight. Managing any other health conditions you have, such as high blood pressure (hypertension) or diabetes. Reducing stress, such as with yoga or relaxation techniques.  General instructions Pay attention to any changes in your symptoms. Tell your health care provider about them or any new symptoms. Avoid any activities that cause chest pain. Keep all follow-up visits as told by your health care provider. This is important. This includes visits for any further testing if your chest pain does not go away. Contact a health care provider if: Your chest pain does not go away. You feel depressed. You have a fever. Get help right away if: Your chest pain gets worse. You have a cough that gets worse, or you cough up blood. You have severe pain in your abdomen. You faint. You have sudden, unexplained chest discomfort. You have sudden, unexplained discomfort in your arms, back, neck, or jaw. You have shortness of breath at any time. You suddenly start to sweat, or your skin gets clammy. You feel nausea or you vomit. You suddenly feel lightheaded or dizzy. You have severe weakness, or unexplained weakness or fatigue. Your heart begins to beat quickly, or it feels like it is  skipping beats. These symptoms may represent a serious problem that is an emergency. Do not wait to see if the symptoms will go away. Get medical help right away. Call your local emergency services (911 in the [place] Do not drive yourself to the  hospital. Summary Chest pain can be caused by a condition that is serious and requires urgent treatment. It may also be caused by something that is not life-threatening. If you have chest pain, it is very important to see your health care provider. Your health care provider may do lab tests and other studies to find the cause of your pain. Follow your health care provider's instructions on taking medicines, making lifestyle changes, and getting emergency treatment if symptoms become worse. Keep all follow-up visits as told by your health care provider. This includes visits for any further testing if your chest pain does not go away. This information is not intended to replace advice given to you by your health care provider. Make sure you discuss any questions you have with your healthcare provider. Document Revised: 08/07/2017 Document Reviewed: 08/07/2017 Elsevier Patient Education  2022 [employer].

## 2020-10-04 ENCOUNTER — Ambulatory Visit (HOSPITAL_BASED_OUTPATIENT_CLINIC_OR_DEPARTMENT_OTHER)
Admission: RE | Admit: 2020-10-04 | Discharge: 2020-10-04 | Disposition: A | Discharge status: Home / Self Care | Payer: Medicare Other | Source: Ambulatory Visit | Attending: Family Medicine | Admitting: Family Medicine

## 2020-10-04 ENCOUNTER — Other Ambulatory Visit: Payer: Self-pay

## 2020-10-04 DIAGNOSIS — R079 Chest pain, unspecified: Secondary | ICD-10-CM | POA: Insufficient documentation

## 2020-10-04 LAB — CBC WITH DIFFERENTIAL/PLATELET
Basophils Absolute: 0.1 10*3/uL (ref 0.0–0.1)
Basophils Relative: 1 % (ref 0.0–3.0)
Eosinophils Absolute: 0.1 10*3/uL (ref 0.0–0.7)
Eosinophils Relative: 1.3 % (ref 0.0–5.0)
HCT: 33.9 % — ABNORMAL LOW (ref 36.0–46.0)
Hemoglobin: 11.1 g/dL — ABNORMAL LOW (ref 12.0–15.0)
Lymphocytes Relative: 25.5 % (ref 12.0–46.0)
Lymphs Abs: 2.7 10*3/uL (ref 0.7–4.0)
MCHC: 32.8 g/dL (ref 30.0–36.0)
MCV: 87.4 fl (ref 78.0–100.0)
Monocytes Absolute: 0.8 10*3/uL (ref 0.1–1.0)
Monocytes Relative: 7.3 % (ref 3.0–12.0)
Neutro Abs: 6.8 10*3/uL (ref 1.4–7.7)
Neutrophils Relative %: 64.9 % (ref 43.0–77.0)
Platelets: 326 10*3/uL (ref 150.0–400.0)
RBC: 3.88 Mil/uL (ref 3.87–5.11)
RDW: 15.9 % — ABNORMAL HIGH (ref 11.5–15.5)
WBC: 10.4 10*3/uL (ref 4.0–10.5)

## 2020-10-04 LAB — COMPREHENSIVE METABOLIC PANEL
ALT: 10 U/L (ref 0–35)
AST: 14 U/L (ref 0–37)
Albumin: 3.7 g/dL (ref 3.5–5.2)
Alkaline Phosphatase: 70 U/L (ref 39–117)
BUN: 21 mg/dL (ref 6–23)
CO2: 31 mEq/L (ref 19–32)
Calcium: 9.2 mg/dL (ref 8.4–10.5)
Chloride: 104 mEq/L (ref 96–112)
Creatinine, Ser: 0.91 mg/dL (ref 0.40–1.20)
GFR: 61.94 mL/min (ref >60.00)
Glucose, Bld: 109 mg/dL — ABNORMAL HIGH (ref 70–99)
Potassium: 3.5 mEq/L (ref 3.5–5.1)
Sodium: 142 mEq/L (ref 135–145)
Total Bilirubin: 0.3 mg/dL (ref 0.2–1.2)
Total Protein: 6.8 g/dL (ref 6.0–8.3)

## 2020-10-04 LAB — H. PYLORI ANTIBODY, IGG: H Pylori IgG: NEGATIVE

## 2020-10-05 ENCOUNTER — Ambulatory Visit (INDEPENDENT_AMBULATORY_CARE_PROVIDER_SITE_OTHER): Payer: Medicare Other

## 2020-10-05 DIAGNOSIS — J309 Allergic rhinitis, unspecified: Secondary | ICD-10-CM | POA: Diagnosis not present

## 2020-10-05 DIAGNOSIS — R079 Chest pain, unspecified: Secondary | ICD-10-CM | POA: Diagnosis not present

## 2020-10-05 DIAGNOSIS — R001 Bradycardia, unspecified: Secondary | ICD-10-CM | POA: Diagnosis not present

## 2020-10-08 ENCOUNTER — Other Ambulatory Visit: Payer: Self-pay

## 2020-10-08 ENCOUNTER — Ambulatory Visit
Admission: RE | Admit: 2020-10-08 | Discharge: 2020-10-08 | Disposition: A | Discharge status: Home / Self Care | Payer: Medicare Other | Source: Ambulatory Visit | Attending: Neurosurgery | Admitting: Neurosurgery

## 2020-10-08 DIAGNOSIS — M25511 Pain in right shoulder: Secondary | ICD-10-CM | POA: Diagnosis not present

## 2020-10-09 ENCOUNTER — Telehealth: Payer: Self-pay | Admitting: Orthopaedic Surgery

## 2020-10-09 NOTE — Telephone Encounter (Signed)
Patient called. She said that her pain is not any better. Would like Dr. [name] to know. Her call back number is [phone]

## 2020-10-10 DIAGNOSIS — M25552 Pain in left hip: Secondary | ICD-10-CM | POA: Diagnosis not present

## 2020-10-10 DIAGNOSIS — I10 Essential (primary) hypertension: Secondary | ICD-10-CM | POA: Diagnosis not present

## 2020-10-10 DIAGNOSIS — M25511 Pain in right shoulder: Secondary | ICD-10-CM | POA: Diagnosis not present

## 2020-10-10 NOTE — Telephone Encounter (Signed)
She should follow up with [place]

## 2020-10-11 ENCOUNTER — Ambulatory Visit: Payer: Medicare Other | Admitting: Pharmacist

## 2020-10-11 DIAGNOSIS — E785 Hyperlipidemia, unspecified: Secondary | ICD-10-CM

## 2020-10-11 DIAGNOSIS — K219 Gastro-esophageal reflux disease without esophagitis: Secondary | ICD-10-CM

## 2020-10-11 DIAGNOSIS — I10 Essential (primary) hypertension: Secondary | ICD-10-CM

## 2020-10-11 DIAGNOSIS — I25119 Atherosclerotic heart disease of native coronary artery with unspecified angina pectoris: Secondary | ICD-10-CM

## 2020-10-11 DIAGNOSIS — G8929 Other chronic pain: Secondary | ICD-10-CM

## 2020-10-11 DIAGNOSIS — R519 Headache, unspecified: Secondary | ICD-10-CM

## 2020-10-11 NOTE — Chronic Care Management (AMB) (Signed)
Chronic Care Management Pharmacy Note  10/11/2020 Name:  Stephanie Parks MRN:  [id number] DOB:  Feb 02, 1946   Subjective: Stephanie Parks is an 75 y.o. year old female who is a primary patient of Copland, Gay Filler, MD.  The CCM team was consulted for assistance with disease management and care coordination needs.    Engaged with patient by telephone for follow up visit in response to provider referral for pharmacy case management and/or care coordination services.   Consent to Services:  The patient was given information about Chronic Care Management services, agreed to services, and gave verbal consent prior to initiation of services.  Please see initial visit note for detailed documentation.   Patient Care Team: Copland, Gay Filler, MD as PCP - General (Family Medicine) Jettie Booze, MD as PCP - Cardiology (Cardiology) Ashok Pall, MD as Consulting Physician (Neurosurgery) Luretha Rued, RN as Case Manager Cherre Robins, PharmD (Pharmacist)  Recent office visits: 10/03/2020 - PCP (Dr Etter Sjogren) Patient presented for acute visit with CP. Had been to urgent care at [facility] who has recommended she go to ER but patient presented to PCP office instead. Noted CP has recolved. Labs checked. Changed omeprazole to Dexilant 41m daily. Add pepcid.  08/31/2020 - PCP (Dr CLorelei Pont Seen for anxiety and right shoulder pain; ordered mammogram; referred to ortho for shoulder pain  Recent consult visits: 10[place]/2020 [place] [facility] [place] [facility]  10/05/2020 - GI (Dr HOren Beckmann Phone Message. Patient requested PPI sent to pharmacy . Omeprazole was sent. 09/27/2020 - Ortho (Dr XErlinda Parks Left hip steriod injection. 09/15/2020 - GI Phone Call - continued bloating and swallowing difficulty. Dr HOren Beckmannnoted nothing significant to explain symptoms on EGD and manometry from 09/01/20. Recommended H.Pylori Breath test. Patient agreed with proceeding with HBT. 09/07/2020 - Immunotherapy  with allergiest office 09/01/2020 - GI - esophageal manometry performed  Hospital visits: 10/03/2020 - [facility] [place] Seen for CP. EKG - showed bradycardia but otherwise normal. Suspected reflux. Sent to ED since patient wanted troponin checked. However instead patient made appt with PCP office.   Objective:  Lab Results  Component Value Date   CREATININE 0.91 10/03/2020   CREATININE 0.84 08/31/2020   CREATININE 0.70 07/24/2020    Lab Results  Component Value Date   HGBA1C 5.9 06/02/2019   Last diabetic Eye exam: No results found for: HMDIABEYEEXA  Last diabetic Foot exam: No results found for: HMDIABFOOTEX      Component Value Date/Time   CHOL 161 06/02/2019 1439   CHOL 176 07/28/2018 0756   TRIG 98 03/12/2020 1503   HDL 56.00 06/02/2019 1439   HDL 73 07/28/2018 0756   CHOLHDL 3 06/02/2019 1439   VLDL 17.2 06/02/2019 1439   LDLCALC 87 06/02/2019 1439   LDLCALC 93 07/28/2018 0756    Hepatic Function Latest Ref Rng & Units 10/03/2020 07/24/2020 07/10/2020  Total Protein 6.0 - 8.3 g/dL 6.8 6.6 6.9  Albumin 3.5 - 5.2 g/dL 3.7 3.3(L) 3.3(L)  AST 0 - 37 U/L 14 19 23   ALT 0 - 35 U/L 10 12 12   Alk Phosphatase 39 - 117 U/L 70 59 61  Total Bilirubin 0.2 - 1.2 mg/dL 0.3 0.4 0.5  Bilirubin, Direct 0.00 - 0.40 mg/dL - - -    Lab Results  Component Value Date/Time   TSH 3.80 03/08/2020 04:31 PM   TSH 2.64 11/10/2019 02:36 PM    CBC Latest Ref Rng & Units 10/03/2020 07/24/2020 07/24/2020  WBC 4.0 - 10.5 K/uL 10.4 - 6.7  Hemoglobin 12.0 - 15.0 g/dL 11.1(L) 11.6(L) 11.2(L)  Hematocrit 36.0 - 46.0 % 33.9(L) 34.0(L) 34.5(L)  Platelets 150.0 - 400.0 K/uL 326.0 - 311    Lab Results  Component Value Date/Time   VD25OH 36.70 09/26/2016 10:59 AM    Clinical ASCVD: Yes  The ASCVD Risk score [name], et al., 2013) failed to calculate for the following reasons:   The patient has a prior MI or stroke diagnosis     Social History   Tobacco Use  Smoking  Status Former   Packs/day: 0.50   Years: 20.00   Pack years: 10.00   Types: Cigarettes   Quit date: 02/19/1984   Years since quitting: 36.6  Smokeless Tobacco Never   BP Readings from Last 3 Encounters:  10/03/20 (!) 150/60  08/31/20 (!) 144/62  08/17/20 119/62   Pulse Readings from Last 3 Encounters:  10/03/20 72  08/31/20 78  08/17/20 67   Wt Readings from Last 3 Encounters:  10/03/20 196 lb 3.2 oz (89 kg)  09/27/20 195 lb (88.5 kg)  08/31/20 191 lb 3.2 oz (86.7 kg)    Assessment: Review of patient past medical history, allergies, medications, health status, including review of consultants reports, laboratory and other test data, was performed as part of comprehensive evaluation and provision of chronic care management services.   SDOH:  (Social Determinants of Health) assessments and interventions performed:     CCM Care Plan  Allergies  Allergen Reactions   Shellfish Allergy Hives and Swelling   Ace Inhibitors Other (See Comments)    Pt cannot recall her reaction but tolerates arb    Gabapentin Other (See Comments)    headaches   Pitavastatin     Unknown reaction    Topiramate     Heart race,    Sulfa Antibiotics Other (See Comments) and Rash    Fine bumps    Medications Reviewed Today     Reviewed by Cherre Robins, PharmD (Pharmacist) on 10/11/20 at 1439  Med List Status: <None>   Medication Order Taking? Sig Documenting Provider Last Dose Status Informant  acetaminophen (TYLENOL) 325 MG tablet [id number] Yes Take 650 mg by mouth. For headaches [provider] Taking Active Self  Calcium Carb-Cholecalciferol (CALCIUM 600 + D PO) [id number] Yes Take 1 tablet by mouth daily. [provider] Taking Active Self  ELDERBERRY PO [id number] Yes Take 50 mg by mouth daily. Also contains zinc and vitamin C [provider] Taking Active Self  EPINEPHrine (EPIPEN 2-PAK) 0.3 mg/0.3 mL IJ SOAJ injection [id number] Yes Inject 0.3 mLs (0.3 mg total)  into the muscle as needed for anaphylaxis. Per allergen immunotherapy protocol Ambs, Kathrine Cords, FNP Taking Active Self           Med Note Vita Barley Mar 13, 2020 12:35 PM)    fluticasone (FLONASE) 50 MCG/ACT nasal spray [id number] Yes USE 2 SPRAYS IN NOSTRIL(S) DAILY AS NEEDED.  In the right nostril point the applicator out toward your right ear.  In the left nostril point the applicator out toward her left ear  Patient taking differently: Place 2 sprays into both nostrils daily as needed for allergies.   Dara Hoyer, FNP Taking Active            Med Note Vita Barley Mar 13, 2020 12:32 PM)    Donah Driver [facility]) 120 MG/ML SOAJ [id number] Yes Inject 120 mg into  the skin every 30 (thirty) days. Melvenia Beam, MD Taking Active   losartan (COZAAR) 100 MG tablet [id number] Yes Take 1 tablet (100 mg total) by mouth daily. Jettie Booze, MD Taking Active   METAMUCIL FIBER PO [id number] Yes Take by mouth. [provider] Taking Active            Med Note Delfino Lovett Sep 14, 2020  2:47 PM) Reports takes every other day (powder form)  Patient not taking:  Discontinued 10/11/20 1438 (Completed Course)   metoprolol succinate (TOPROL-XL) 25 MG 24 hr tablet [id number] Yes Take 0.5 tablets (12.5 mg total) by mouth daily. Copland, Gay Filler, MD Taking Active   Multiple Vitamins-Minerals (MULTIVITAL) tablet [id number] Yes Take 1 tablet by mouth daily.  [provider] Taking Active Self           Med Note Pollyann Kennedy Aug 17, 2020 11:19 AM) Alive vit  omeprazole (PRILOSEC) 40 MG capsule [id number] Yes Take 40 mg by mouth daily. [provider] Taking Active   potassium chloride SA (KLOR-CON M20) 20 MEQ tablet [id number] Yes TAKE 2 TABLETS ONE DAY THEN 1 TABLET THE NEXT Copland, Gay Filler, MD Taking Active            Med Note Antony Contras, Lynelle Smoke B   Thu Sep 28, 2020  3:40 PM)    rosuvastatin (CRESTOR) 10 MG tablet [id number] Yes TAKE 1  TABLET BY MOUTH EVERY DAY  Patient taking differently: Take 10 mg by mouth daily.   Copland, Gay Filler, MD Taking Active            Med Note Vita Barley Mar 13, 2020 12:35 PM)    Specialty Vitamins Products (MAGNESIUM, AMINO ACID CHELATE,) 133 MG tablet [id number] Yes Take 1 tablet by mouth daily. Dose unknown [provider] Taking Active   traMADol (ULTRAM) 50 MG tablet [id number] Yes Take 1 tablet (50 mg total) by mouth 2 (two) times daily as needed. Aundra Dubin, PA-C Taking Active   UBRELVY 100 MG TABS [id number] Yes TAKE 1 TABLET BY MOUTH AS NEEDED. CAN TAKE 1 ADDITIONAL TABLET IN 2 HOURS IF NEEDED AFTER INITIAL DOSE. NO MORE THAN 2 TABLETS IN 24 HOURS. Melvenia Beam, MD Taking Active   Med List Note Lorelei Pont, Gay Filler, MD 04/06/20 1050):              Patient Active Problem List   Diagnosis Date Noted   Headache 06/07/2020   Acute respiratory failure with hypoxia ([place]) 05/02/2020   Post-COVID chronic dyspnea 05/02/2020   Pulsatile neck mass 04/26/2020   Pneumonia due to COVID-19 virus 03/12/2020   Headache disorder 01/04/2020   Seasonal allergic conjunctivitis 10/04/2019   Dysfunction of right eustachian tube 10/04/2019   Pre-diabetes 06/02/2019   Seasonal and perennial allergic rhinitis 12/22/2018   Heart palpitations 12/22/2018   History of chest pain 07/20/2018   Brain aneurysm 07/20/2018   Dyslipidemia 05/04/2018   Bronchitis 04/23/2018   Tendonitis, Achilles, right 12/08/2017   Onychomycosis 12/08/2017   Unilateral primary osteoarthritis, left knee 10/07/2017   Ingrown toenail 08/10/2017   Coughing 04/24/2017   CAD (coronary artery disease) 03/01/2015   Aneurysm ([place]) 03/01/2015   Dizziness 03/01/2015   Paresthesia of arm 03/01/2015   Seasonal allergic rhinitis 03/01/2015   Achalasia 09/28/2012   CN (constipation) 09/28/2012   Cephalalgia 08/24/2012   Essential hypertension 04/19/2011   Hyperlipidemia 04/19/2011   GERD  (  gastroesophageal reflux disease) 04/19/2011   Chest pain 04/04/2011    Immunization History  Administered Date(s) Administered   PFIZER(Purple Top)SARS-COV-2 Vaccination 06/07/2019, 06/30/2019    Conditions to be addressed/monitored: CAD, HTN, HLD, Anxiety, and migraine headache; achalasia; GERD; OA; pre DM  Care Plan : General Pharmacy (Adult)  Updates made by Cherre Robins, PHARMD since 10/11/2020 12:00 AM     Problem: Chronic Headaches, Aneurysm; CAD; HDL; HTN; bronchitis; achalasia; GERD; anxiety; chronic constipation; OA   Priority: High  Onset Date: 09/28/2020     Long-Range Goal: Medication and chronic conditions management   Start Date: 09/28/2020  Priority: High  Note:   Current Barriers:  Unable to independently afford treatment regimen Unable to achieve control of migraine headaches or HTN   Pharmacist Clinical Goal(s):  Over the next 90 days, patient will verbalize ability to afford treatment regimen achieve adherence to monitoring guidelines and medication adherence to achieve therapeutic efficacy achieve control of migraine headaches as evidenced by decreasing frequency of migraine headaches maintain control of HTN as evidenced by BP <140/90  through collaboration with PharmD and provider.   Interventions: 1:1 collaboration with Copland, Gay Filler, MD regarding development and update of comprehensive plan of care as evidenced by provider attestation and co-signature Inter-disciplinary care team collaboration (see longitudinal plan of care) Comprehensive medication review performed; medication list updated in electronic medical record  Migraine Headaches:  Uncontrolled but some improvement with 6 day course of methylprednisolone Having about 5 headaches per week still Goal: decrease frequency of headaches to less than 15 days per month Managed by neurology - Amy Lomax Current Therapy:  Emgality - received first injection 09/17/2020 Ubrelvy 179m - take 1  tablet as needed for headache. May take an additional tablet if needed in 2 hours. No more than 2 tablets in 24 hours Previous therapies: topiramate, nortriptyline, gabapentin, Ajovy (08/17/2020 - no [facility] by insurance [facility]  Patient reports she has migraines several years ago but had been rare until May 2022. She feels like return of migraine headaches is linked to COVID infection from 03/2020 She has not seen any improvement in headache frequency since starting Emgality.  She is also concerned with cost of Emgality - $100 per month Ubrelvy helps for a few hours but headache returns quickly and she is unable to take every day. At last visit reviewed patient's formulary / called [facility] patient assistance applications Ajovy, Vyepti, Nurtec and QLenoria Chimeare all non formulary (but called insurance and could ask for coverage determination if she has tried but failed other formulary medications Emgality and Aimovig are tier 4 - $100 / 30 days I did review other possible ways to help with cost of these medications Looked to see if HDubberlywas open however it is currently closed. Checked with her pharmacy. They did a test claim and next Emgality will cost $213.59 - patient will enter Medicare coverage with next refill.  If patient is to stay on Emgality, could apply for patient assistance through the [employer] Also is assistance available for Nurtec.  Interventions:  Discussed patient assistance applications. Patient received but has not completed yet.  Reminded that it might take 3 to 4 months to see the full effects of Emgality Continue to follow up with neurology  Hypertension: Uncontrolled per last 3 office BP readings; Possibly related to pain / headaches.  BP goal <140/90  BP Readings from Last 3 Encounters:  10/03/20 (!) 150/60  08/31/20 (!) 144/62  08/17/20 119/62  Current treatment: Losartan 169m  daily Metoprolol succinate 267mER - take 0.5 tablets daily (also helps with palpitations)  Current home readings: 138/65 this morning and HR 70 Having about 5 headaches per week still Interventions:  Discussed blood pressure goals Continue to check blood pressure and heart rate daily, record and bring to future appointments  Hyperlipidemia / CAD: No recent LDL but patient is on moderate to high intensity statin with CAD.  Current treatment:rosuvastatin 1043maily Interventions:  Continue current regimen Consider checking lipid panel with next labs  Chronic constipation:  Controlled Current treatment:  Linzess 19m55maily Previous therapies: Amitiza 8mcg97mpatient could not remember why stopped Interventions:  RevieAvageneric Amitiza are tier 3 or $47/month. Patient will enter Medicare coverage gap soon.  Mailed application for Linzess patient assistance at last visit but patient has not completed yet.  Osteopenia:  Last DEXA 09/12/2016 T-Score for Left femur nek = -1.2 T-Score for AP Spine = + 1.3 FRAX = 3.9% for osteoporotic fracture / 0.5% hip fracture Current Therapy:  Calcium + D - 500mg 47my Previous therapy: alendronate Interventions:  Continue to take calcium + D daily - goal is to get 1200mg o8mlcium daily from supplement and diet Vitamin D goal is to get 1000 IU daily Fall prevention Consider recheck DEXA  GERD:  Improved Patient was seen in ED and then in our office 10/03/2020 for chest pain that was thought to be related to acid reflux H.Pylori was neg H/H showed stable anemia Dr Lowne rEtter Sjogrenended she change omeprazole to Dexilant and add Pepcid up to bid as needed.  Patient never tried either Dexilant or Pepcid. She states her pharmacy told her she should not take both omeprazole and Dexilant.  Denied reflux symptoms or chest pain since 10/03/2020 Current therapy:  Omeprazole 40mg  d82m Interventions: Explained that she was to try Dexilant in place of omeprazole since she was having reflux symptoms. Patient reports she would like to stick with omeprazole. Med list updated.  Recommended if she has breakthrough symptoms she can try OTC Pepcid up to bid  Continue as planned to f/u with Dr Honor / Oren Beckmann September 2022.  Patient Goals/Self-Care Activities Over the next 90 days, patient will:  take medications as prescribed, check blood pressure daily, document, and provide at future appointments, collaborate with provider on medication access solutions, and work with neurologist regarding migraine headaches   Follow Up Plan: Telephone follow up appointment with care management team member scheduled for:  2 toweeks          Medication Assistance: Application for Emgality[employer]zess  medication assistance program. in process.  Anticipated assistance start date 10/19/2020.  See plan of care for additional detail. Applications were mailed at beginning of August. Patient endorses that she has received but has not completed PAP applications yet.   Patient's preferred pharmacy is:  [id number] [id number] [place] [phone] Up:  Patient agrees to Care Plan and Follow-up.  Plan: Telephone follow up appointment with care management team member scheduled for:  2 to 4 weeks  Estella Malatesta EckCherre RobinsClinical Pharmacist [facility]

## 2020-10-11 NOTE — Patient Instructions (Signed)
Visit Information  PATIENT GOALS:  Goals Addressed             This Visit's Progress    Pharmcy Chronic Care Plan   On track    Current Barriers:  Unable to independently afford treatment regimen Unable to achieve control of migraine headaches or HTN   Pharmacist Clinical Goal(s):  Over the next 90 days, patient will verbalize ability to afford treatment regimen achieve adherence to monitoring guidelines and medication adherence to achieve therapeutic efficacy achieve control of migraine headaches as evidenced by decreasing frequency of migraine headaches maintain control of HTN as evidenced by BP <140/90  through collaboration with PharmD and provider.   Interventions: 1:1 collaboration with Copland, Gay Filler, MD regarding development and update of comprehensive plan of care as evidenced by provider attestation and co-signature Inter-disciplinary care team collaboration (see longitudinal plan of care) Comprehensive medication review performed; medication list updated in electronic medical record  Migraine Headaches:  Goal: decrease frequency of headaches to less than 15 days per month Managed by neurology - Amy Lomax Current Therapy:  Emgality - received first injection 09/17/2020 Ubrelvy '100mg'$  - take 1 tablet as needed for headache. May take an additional tablet if needed in 2 hours. No more than 2 tablets in 24 hours During our phone visit patient had call from neurology office. Neuro started Medrol dose pack.  Interventions:  Reviewed patient's formulary / called insurance - [facility] [place], [place], Nurtec and [place] are all non formulary (but called insurance and could ask for coverage determination if she has tried but failed other formulary medications) Emgality and Aimovig are tier 4 - $100 / 30 days Checked with her pharmacy. They did a test claim and next Emgality will cost $213.59 - patient will enter Medicare coverage with next refill.  If patient is to  stay on Emgality, could apply for patient assistance through the [employer]. Also assistance available for Nurtec.  Sent patient information about above therapies after last appointment in early August 2022.   Explained that it might take 3 to 4 months to see the full effects of Emgality Reminded to complete patient assistance applications  Hypertension: Uncontrolled per last 3 office BP readings; Possibly related to pain / headaches.  BP goal <140/90  BP Readings from Last 3 Encounters:  10/03/20 (!) 150/60  08/31/20 (!) 144/62  08/17/20 119/62   Current treatment: Losartan '100mg'$  daily Metoprolol succinate '25mg'$  ER - take 0.5 tablets daily (also helps with palpitations)  Interventions:  Discussed blood pressure goals Continue to check blood pressure and heart rate daily, record and bring to future appointments  Hyperlipidemia / CAD: No recent LDL but patient is taking moderate to high intensity statin. Current treatment:rosuvastatin '10mg'$  daily Interventions:  Continue current regimen Consider checking lipid panel with next labs  Chronic constipation:  Current treatment:  Linzess 69mg daily Previous therapies: Amitiza 823m - patient could not remember why stopped Interventions:  ReBrownsvillend generic Amitiza are tier 3 or $47/month. Patient will enter Medicare coverage gap soon.  Mailed application for Linzess patient assistance.   Osteopenia / low bone density:  Current Therapy:  Calcium + D - '500mg'$  daily Interventions:  Continue to take calcium + D daily - goal is to get '1200mg'$  of calcium daily from supplement and diet Vitamin D goal is to get 1000 IU daily Fall prevention Consider rechecking DEXA  GERD / acid reflux:  Goal: Decrease symptoms of acid reflux Current therapy:  Omeprazole '40mg'$  daily Interventions: Explained that Dr Etter Sjogren has intended for her to [place] in place of omeprazole since she was having reflux  symptoms. Patient reports she would like to stick with omeprazole. Med list updated.  Recommended if she has breakthrough symptoms she can try OTC Pepcid up to twice a day Monitor for symptoms of acid reflux and report to office if experience these symptoms:  A burning sensation in your chest (heartburn), usually after eating, which might be worse at night. Chest pain. Difficulty swallowing. Regurgitation of food or sour liquid. Sensation of a lump in your throat. Continue as planned to f/u with Dr Oren Beckmann / GI in September 2022.  Patient Goals/Self-Care Activities Over the next 90 days, patient will:  take medications as prescribed,  Check blood pressure daily, document, and provide at future appointments,  Collaborate with provider on medication access solutions, and  Work with neurologist regarding migraine headaches   Follow Up Plan: Telephone follow up appointment with care management team member scheduled for:  2 - 4 weeks          Patient verbalizes understanding of instructions provided today and agrees to view in [place].   Telephone follow up appointment with care management team member scheduled for: 2 to 4 weeks  Cherre Robins, PharmD Clinical Pharmacist [facility]

## 2020-10-11 NOTE — Telephone Encounter (Signed)
Called patient no answer LMOM. Needs appt with Xu/Lindsey

## 2020-10-12 ENCOUNTER — Telehealth: Payer: Self-pay | Admitting: Orthopaedic Surgery

## 2020-10-12 NOTE — Telephone Encounter (Signed)
09/27/20 ov note faxed to [place]

## 2020-10-16 ENCOUNTER — Telehealth: Payer: Medicare Other

## 2020-10-16 DIAGNOSIS — M75111 Incomplete rotator cuff tear or rupture of right shoulder, not specified as traumatic: Secondary | ICD-10-CM | POA: Diagnosis not present

## 2020-10-16 DIAGNOSIS — M75121 Complete rotator cuff tear or rupture of right shoulder, not specified as traumatic: Secondary | ICD-10-CM | POA: Diagnosis not present

## 2020-10-16 DIAGNOSIS — M19011 Primary osteoarthritis, right shoulder: Secondary | ICD-10-CM | POA: Diagnosis not present

## 2020-10-16 DIAGNOSIS — S46011A Strain of muscle(s) and tendon(s) of the rotator cuff of right shoulder, initial encounter: Secondary | ICD-10-CM | POA: Diagnosis not present

## 2020-10-18 ENCOUNTER — Ambulatory Visit (INDEPENDENT_AMBULATORY_CARE_PROVIDER_SITE_OTHER): Payer: Medicare Other | Admitting: Orthopaedic Surgery

## 2020-10-18 ENCOUNTER — Other Ambulatory Visit: Payer: Self-pay

## 2020-10-18 ENCOUNTER — Other Ambulatory Visit: Payer: Self-pay | Admitting: Family Medicine

## 2020-10-18 ENCOUNTER — Encounter: Payer: Self-pay | Admitting: Orthopaedic Surgery

## 2020-10-18 DIAGNOSIS — E785 Hyperlipidemia, unspecified: Secondary | ICD-10-CM | POA: Diagnosis not present

## 2020-10-18 DIAGNOSIS — G4733 Obstructive sleep apnea (adult) (pediatric): Secondary | ICD-10-CM | POA: Diagnosis not present

## 2020-10-18 DIAGNOSIS — M7062 Trochanteric bursitis, left hip: Secondary | ICD-10-CM | POA: Diagnosis not present

## 2020-10-18 DIAGNOSIS — I25119 Atherosclerotic heart disease of native coronary artery with unspecified angina pectoris: Secondary | ICD-10-CM

## 2020-10-18 DIAGNOSIS — I10 Essential (primary) hypertension: Secondary | ICD-10-CM

## 2020-10-18 DIAGNOSIS — E876 Hypokalemia: Secondary | ICD-10-CM

## 2020-10-18 DIAGNOSIS — M1612 Unilateral primary osteoarthritis, left hip: Secondary | ICD-10-CM | POA: Insufficient documentation

## 2020-10-18 MED ORDER — LIDOCAINE HCL 1 % IJ SOLN
2.0000 mL | INTRAMUSCULAR | Status: AC | PRN
  Administered 2020-10-18: 2 mL

## 2020-10-18 MED ORDER — BUPIVACAINE HCL 0.25 % IJ SOLN
2.0000 mL | INTRAMUSCULAR | Status: AC | PRN
  Administered 2020-10-18: 2 mL via INTRA_ARTICULAR

## 2020-10-18 MED ORDER — METHYLPREDNISOLONE ACETATE 40 MG/ML IJ SUSP
80.0000 mg | INTRAMUSCULAR | Status: AC | PRN
  Administered 2020-10-18: 80 mg via INTRA_ARTICULAR

## 2020-10-18 NOTE — Progress Notes (Addendum)
Office Visit Note   Patient: Stephanie Parks           Date of Birth: December 06, 1945           MRN: [id number] Visit Date: 10/18/2020              Requested by: [name], MD [address] [place],  [place] [address] PCP: [name], MD   Assessment & Plan: Visit Diagnoses:  1. Unilateral primary osteoarthritis, left hip   2. Greater trochanteric bursitis of left hip     Plan: Stephanie Parks was a patient of mine in the past when I evaluate her for the arthritis in her left knee.  She recently has had some problems with her left hip and came today for further consultation.  She has seen Stephanie Parks who diagnosed osteoarthritis of her left hip.  Stephanie Parks recently performed an intra-articular cortisone injection.  Stephanie Parks relates that it did not help.  Stephanie Parks suggested that if it was not effective then maybe it would be worthwhile to consider injecting the greater trochanteric region as she was tender in that area as well.  She was tender over the greater trochanter today so I will inject that area with cortisone.  I suspect the real issue is the osteoarthritis of her left hip.  I did review her x-rays and there is significant narrowing of the joint space compared to the right hip.  She does have some discomfort with internal or external rotation.  I have also discussed the possibility of a total hip replacement.  I probably would consider an MRI scan before we proceed with.  She will let me know how she does with the injection over the greater trochanter  Follow-Up Instructions: Return if symptoms worsen or fail to improve.   Orders:  No orders of the defined types were placed in this encounter.  No orders of the defined types were placed in this encounter.     Procedures: Large Joint Inj: L greater trochanter on 10/18/2020 12:18 PM Indications: pain and diagnostic evaluation Details: 25 G 1.5 in needle, lateral approach  Arthrogram: No  Medications: 2 mL  lidocaine 1 %; 80 mg methylPREDNISolone acetate 40 MG/ML; 2 mL bupivacaine 0.25 % Procedure, treatment alternatives, risks and benefits explained, specific risks discussed. Consent was given by the patient. Immediately prior to procedure a time out was called to verify the correct patient, procedure, equipment, support staff and site/side marked as required. Patient was prepped and draped in the usual sterile fashion.      Clinical Data: No additional findings.   Subjective: Chief Complaint  Patient presents with   Left Hip - Pain  Patient presents today for left hip pain. She started with pain in her left leg a month ago. Then her hip started to hurt. She said that her pain is located laterally. No groin, buttock, or lower back pain. She said that it hurts constantly. She said that walking makes it worse. She takes tylenol for temporary relief. She had x-rays of her and received a intraarticular hip injection with Stephanie Parks on 09/27/2020. She said that the injection did not help, and in fact felt worse. She is walking with the assistance of a cane. Has had issues with her lumbar spine in the past with prior surgery but relates that she does not feel like there is any correlation between her back and her present left hip symptoms HPI  Review of Systems  Objective: Vital Signs: Ht 5' 5.5" (1.664 m)   Wt 196 lb (88.9 kg)   BMI 32.12 kg/m   Physical Exam Constitutional:      Appearance: She is well-developed.  Pulmonary:     Effort: Pulmonary effort is normal.  Skin:    General: Skin is warm and dry.  Neurological:     Mental Status: She is alert and oriented to person, place, and time.  Psychiatric:        Behavior: Behavior normal.    Ortho Exam awake alert and oriented x3.  Comfortable sitting.  Local tenderness over the tip of the greater trochanter left hip.  Skin intact.  No induration or ecchymosis.  There was some discomfort with internal and external rotation and mild  loss of motion of the left hip.  Straight leg raise negative.  No pain referred from her lumbar spine.  Specialty Comments:  No specialty comments available.  Imaging: No results found.   PMFS History: Patient Active Problem List   Diagnosis Date Noted   Unilateral primary osteoarthritis, left hip 10/18/2020   Greater trochanteric bursitis of left hip 10/18/2020   Headache 06/07/2020   Acute respiratory failure with hypoxia ([place]) 05/02/2020   Post-COVID chronic dyspnea 05/02/2020   Pulsatile neck mass 04/26/2020   Pneumonia due to COVID-19 virus 03/12/2020   Headache disorder 01/04/2020   Seasonal allergic conjunctivitis 10/04/2019   Dysfunction of right eustachian tube 10/04/2019   Pre-diabetes 06/02/2019   Seasonal and perennial allergic rhinitis 12/22/2018   Heart palpitations 12/22/2018   History of chest pain 07/20/2018   Brain aneurysm 07/20/2018   Dyslipidemia 05/04/2018   Bronchitis 04/23/2018   Tendonitis, Achilles, right 12/08/2017   Onychomycosis 12/08/2017   Unilateral primary osteoarthritis, left knee 10/07/2017   Ingrown toenail 08/10/2017   Coughing 04/24/2017   CAD (coronary artery disease) 03/01/2015   Aneurysm ([place]) 03/01/2015   Dizziness 03/01/2015   Paresthesia of arm 03/01/2015   Seasonal allergic rhinitis 03/01/2015   Achalasia 09/28/2012   CN (constipation) 09/28/2012   Cephalalgia 08/24/2012   Essential hypertension 04/19/2011   Hyperlipidemia 04/19/2011   GERD (gastroesophageal reflux disease) 04/19/2011   Chest pain 04/04/2011   Past Medical History:  Diagnosis Date   3-vessel CAD 03/01/2015   Achalasia 09/28/2012   Allergic rhinitis 03/01/2015   Anemia    Aneurysm ([place]) 03/01/2015   BP (high blood pressure) 03/01/2015   Bronchitis 04/23/2018   Cephalalgia 08/24/2012   Overview:  ICD-10 cut over     Chest pain 04/04/2011   CN (constipation) 09/28/2012   Coughing 04/24/2017   Decreased potassium in the blood 03/01/2015   Diverticulosis     Dizziness 03/01/2015   GERD (gastroesophageal reflux disease)    Hiatal hernia    Hypercholesterolemia 03/01/2015   Hyperlipidemia    Hypertension    Ingrown toenail 08/10/2017   Inguinal hernia    Migraine headache    Onychomycosis 12/08/2017   Paresthesia of arm 03/01/2015   Schatzki's ring    Tendonitis, Achilles, right 12/08/2017   Unilateral primary osteoarthritis, left knee 10/07/2017    Family History  Problem Relation Age of Onset   Allergic rhinitis Sister    Diabetes Sister    Hypertension Sister    Heart attack Father 27   Heart disease Father    Stomach cancer Maternal Grandmother    Heart disease Mother    Colon cancer Neg Hx    Angioedema Neg Hx    Asthma Neg Hx  Eczema Neg Hx    Urticaria Neg Hx    Immunodeficiency Neg Hx    Breast cancer Neg Hx    Migraines Neg Hx    Headache Neg Hx     Past Surgical History:  Procedure Laterality Date   ABDOMINAL HYSTERECTOMY     BACK SURGERY     x 3   CARPAL TUNNEL RELEASE     left wrist   CERVICAL SPINE SURGERY     HEMORRHOID SURGERY     Social History   Occupational History   Occupation: Surveyor, 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

## 2020-10-19 DIAGNOSIS — M19011 Primary osteoarthritis, right shoulder: Secondary | ICD-10-CM | POA: Diagnosis not present

## 2020-10-19 DIAGNOSIS — M75111 Incomplete rotator cuff tear or rupture of right shoulder, not specified as traumatic: Secondary | ICD-10-CM | POA: Diagnosis not present

## 2020-10-23 ENCOUNTER — Encounter: Payer: Self-pay | Admitting: Family Medicine

## 2020-10-24 ENCOUNTER — Other Ambulatory Visit: Payer: Self-pay | Admitting: Orthopaedic Surgery

## 2020-10-24 ENCOUNTER — Other Ambulatory Visit: Payer: Self-pay

## 2020-10-24 ENCOUNTER — Telehealth: Payer: Self-pay | Admitting: Orthopaedic Surgery

## 2020-10-24 DIAGNOSIS — M1612 Unilateral primary osteoarthritis, left hip: Secondary | ICD-10-CM

## 2020-10-24 NOTE — Telephone Encounter (Signed)
MRI left hip-do not want her to take a stronger pain med- should consider hip replacement

## 2020-10-24 NOTE — Telephone Encounter (Signed)
Pt called requesting a call back about MRI appt. Pt also need different pain medication. Med is not touching the pain. Please send to pharmacy on file. Please call pt about this matter at [phone].

## 2020-10-24 NOTE — Telephone Encounter (Signed)
Called and spoke with patient. Relayed information. Ordered MRI.

## 2020-10-26 ENCOUNTER — Ambulatory Visit (INDEPENDENT_AMBULATORY_CARE_PROVIDER_SITE_OTHER): Payer: Medicare Other

## 2020-10-26 DIAGNOSIS — I10 Essential (primary) hypertension: Secondary | ICD-10-CM

## 2020-10-26 DIAGNOSIS — E785 Hyperlipidemia, unspecified: Secondary | ICD-10-CM

## 2020-10-26 NOTE — Chronic Care Management (AMB) (Signed)
Chronic Care Management   CCM RN Visit Note  10/26/2020 Name: Stephanie Parks MRN: [id number] DOB: 1945-08-13  Subjective: Stephanie Parks is a 75 y.o. year old female who is a primary care patient of Copland, Gay Filler, MD. The care management team was consulted for assistance with disease management and care coordination needs.    Engaged with patient by telephone for follow up visit in response to provider referral for case management and/or care coordination services.   Consent to Services:  The patient was given information about Chronic Care Management services, agreed to services, and gave verbal consent prior to initiation of services.  Please see initial visit note for detailed documentation.   Patient agreed to services and verbal consent obtained.   Assessment: Review of patient past medical history, allergies, medications, health status, including review of consultants reports, laboratory and other test data, was performed as part of comprehensive evaluation and provision of chronic care management services.   SDOH (Social Determinants of Health) assessments and interventions performed:    CCM Care Plan  Allergies  Allergen Reactions   Shellfish Allergy Hives and Swelling   Ace Inhibitors Other (See Comments)    Pt cannot recall her reaction but tolerates arb    Gabapentin Other (See Comments)    headaches   Pitavastatin     Unknown reaction    Topiramate     Heart race,    Sulfa Antibiotics Other (See Comments) and Rash    Fine bumps    Outpatient Encounter Medications as of 10/26/2020  Medication Sig Note   acetaminophen (TYLENOL) 325 MG tablet Take 650 mg by mouth. For headaches    Calcium Carb-Cholecalciferol (CALCIUM 600 + D PO) Take 1 tablet by mouth daily.    ELDERBERRY PO Take 50 mg by mouth daily. Also contains zinc and vitamin C    EPINEPHrine (EPIPEN 2-PAK) 0.3 mg/0.3 mL IJ SOAJ injection Inject 0.3 mLs (0.3 mg total) into the muscle as needed for  anaphylaxis. Per allergen immunotherapy protocol    fluticasone (FLONASE) 50 MCG/ACT nasal spray USE 2 SPRAYS IN NOSTRIL(S) DAILY AS NEEDED.  In the right nostril point the applicator out toward your right ear.  In the left nostril point the applicator out toward her left ear (Patient taking differently: Place 2 sprays into both nostrils daily as needed for allergies.)    Galcanezumab-gnlm (EMGALITY) 120 MG/ML SOAJ Inject 120 mg into the skin every 30 (thirty) days.    losartan (COZAAR) 100 MG tablet Take 1 tablet (100 mg total) by mouth daily.    METAMUCIL FIBER PO Take by mouth. 09/14/2020: Reports takes every other day (powder form)   metoprolol succinate (TOPROL-XL) 25 MG 24 hr tablet Take 0.5 tablets (12.5 mg total) by mouth daily.    Multiple Vitamins-Minerals (MULTIVITAL) tablet Take 1 tablet by mouth daily.  08/17/2020: Alive vit   omeprazole (PRILOSEC) 40 MG capsule Take 40 mg by mouth daily.    potassium chloride SA (KLOR-CON M20) 20 MEQ tablet TAKE 1 TABLET BY MOUTH EVERY DAY    rosuvastatin (CRESTOR) 10 MG tablet TAKE 1 TABLET BY MOUTH EVERY DAY (Patient taking differently: Take 10 mg by mouth daily.)    Specialty Vitamins Products (MAGNESIUM, AMINO ACID CHELATE,) 133 MG tablet Take 1 tablet by mouth daily. Dose unknown    traMADol (ULTRAM) 50 MG tablet Take 1 tablet (50 mg total) by mouth 2 (two) times daily as needed.    UBRELVY 100 MG TABS TAKE 1 TABLET  BY MOUTH AS NEEDED. CAN TAKE 1 ADDITIONAL TABLET IN 2 HOURS IF NEEDED AFTER INITIAL DOSE. NO MORE THAN 2 TABLETS IN 24 HOURS.    No facility-administered encounter medications on file as of 10/26/2020.    Patient Active Problem List   Diagnosis Date Noted   Unilateral primary osteoarthritis, left hip 10/18/2020   Greater trochanteric bursitis of left hip 10/18/2020   Headache 06/07/2020   Acute respiratory failure with hypoxia ([place]) 05/02/2020   Post-COVID chronic dyspnea 05/02/2020   Pulsatile neck mass 04/26/2020   Pneumonia due  to COVID-19 virus 03/12/2020   Headache disorder 01/04/2020   Seasonal allergic conjunctivitis 10/04/2019   Dysfunction of right eustachian tube 10/04/2019   Pre-diabetes 06/02/2019   Seasonal and perennial allergic rhinitis 12/22/2018   Heart palpitations 12/22/2018   History of chest pain 07/20/2018   Brain aneurysm 07/20/2018   Dyslipidemia 05/04/2018   Bronchitis 04/23/2018   Tendonitis, Achilles, right 12/08/2017   Onychomycosis 12/08/2017   Unilateral primary osteoarthritis, left knee 10/07/2017   Ingrown toenail 08/10/2017   Coughing 04/24/2017   CAD (coronary artery disease) 03/01/2015   Aneurysm ([place]) 03/01/2015   Dizziness 03/01/2015   Paresthesia of arm 03/01/2015   Seasonal allergic rhinitis 03/01/2015   Achalasia 09/28/2012   CN (constipation) 09/28/2012   Cephalalgia 08/24/2012   Essential hypertension 04/19/2011   Hyperlipidemia 04/19/2011   GERD (gastroesophageal reflux disease) 04/19/2011   Chest pain 04/04/2011    Conditions to be addressed/monitored:HTN, HLD, Anxiety, and GERD, chronic pain  Care Plan : Post Covid Pneumonia  Updates made by Luretha Rued, RN since 10/26/2020 12:00 AM  Completed 10/26/2020   Problem: Decrease in Activity and Endurance 2 months Covid Resolved 10/26/2020  Priority: Medium     Long-Range Goal: Development of long term plan for managment of ongoing symptoms post covid pneumonia in a patient with OSA, chronic seasonal allergies, deviated nasal septum Completed 10/26/2020  Start Date: 05/08/2020  Expected End Date: 10/15/2020  This Visit's Progress: On track  Recent Progress: On track  Priority: Medium  Note:   Current Barriers:  Knowledge Deficits related to long term plan for management for ongoing signs/symptoms post Covid pneumonia . Client reports breathing is "ok". She reports increased activity and endurance and her breathing is "OK" since Covid pneumonia. Nurse Case Manager Clinical Goal(s):  patient will verbalize  understanding of plan for improvement of over time, improve ability to use energy conservation and management techniques. patient will work with pulmonary rehab to address needs related to increase mobility and endurance patient will attend all scheduled medical appointments the patient will demonstrate ongoing self health care management ability as evidenced by continuing to attend provider visits, taking medications as recommended, maintaining respiratory and cardiovascular functions during activities. Interventions:  1:1 collaboration with Copland, Gay Filler, MD regarding development and update of comprehensive plan of care as evidenced by provider attestation and co-signature Inter-disciplinary care team collaboration (see longitudinal plan of care) Medications reviewed with client Advised patient to continue self care management strategies: take medications as prescribed; attend provider visits; call provider for questions or concerns. Reviewed scheduled/upcoming provider appointments. Provided positive feedback regarding self care activities Discussed plans with patient for ongoing care management follow up and provided patient with direct contact information for care management team Patient Goals/Self-Care Activities Continue to take medications as prescribed.  Continue to attend scheduled provider appointments. Continue to Eat Healthy meal plan: foods low in salt and heart healthy meals full of fruits, vegetables, whole grains, lean protein  and limit fat, and sugars. It may also help to eat smaller portions 5-6 times a day.  Call your provider if you have any questions or concerns. Follow Up Plan: not indicated at this time     Care Plan : Hypertension (Adult)  Updates made by Luretha Rued, RN since 10/26/2020 12:00 AM     Problem: Disease Progression (Hypertension)      Long-Range Goal: Disease Progression Prevented or Minimized   Start Date: 05/08/2020  Expected End Date:  08/06/2020  This Visit's Progress: On track  Recent Progress: On track  Priority: Medium  Note:   Objective:  Last practice recorded BP readings:  BP Readings from Last 3 Encounters:  10/03/20 (!) 150/60  08/31/20 (!) 144/62  08/17/20 119/62  Most recent eGFR/CrCl:  Lab Results  Component Value Date   EGFR 80 05/15/2020  Current Barriers:  Knowledge Deficits related to long term self-management of hypertension. Reports primary issues today is Left hip pain, Managed by orthopedic provider. She reports  injection to that hip that helped for a couple days. She states no pain at rest, but can be as high as a "9" at times. She reports she has followed up with orthopedic provider and she is awaiting to be scheduled for an MRI of her hip. She also reports right shoulder pain. She states she was receiving physical therapy on that shoulder. She reports physical therapy has helped, but states she is looking for a physical therapist closer to her home town. She is also followed by Gastroenterology re: Achalasia/GERD and constipation. She states her gastroenterologist has scheduled her for a H. Pylori breath test on 11/08/20.  Patient reports she checks her blood pressure periodically, per provider recommendation. Blood pressure today 143/65 HR 61, 155/75 HR 70 on 10/25/20, 129/68 HR 72 on 10/23/20. She reports she remains active, working part-time. Case Manager Clinical Goal(s):  patient will verbalize understanding of plan for hypertension management patient will attend all scheduled medical appointments patient will demonstrate improved adherence to prescribed treatment plan for hypertension as evidenced by taking all medications as prescribed, monitoring and recording blood pressure as directed, adhering to low sodium/DASH diet; taking medications as prescribed; knowing target parameters for blood pressure and calling provider with questions/concerns as needed. Interventions:  Collaboration with Copland,  Gay Filler, MD regarding development and update of comprehensive plan of care as evidenced by provider attestation and co-signature Inter-disciplinary care team collaboration (see longitudinal plan of care) Encouraged to continue to monitor salt intake Medications reviewed with patient and encouraged to continue taking as prescribed. scheduled/upcoming provider appointments discussed. Encouraged patient to call provider if symptoms/pain worsen. Discussed plans with patient for ongoing care management follow up and provided patient with direct contact information for care management team Self-Care Activities: Take medications as prescribed Attends scheduled provider appointments Calls provider office for new concerns, questions Monitors Blood pressure Patient Goals: Call your orthopedic doctor if hip pain worsens or does not improve. Call your insurance provider to assist with finding a physical therapist provider in network. Continue to remain active per provider's recommendations. Continue to check blood pressure periodically as recommended by provider and take log to provider appointments. Attend provider visits as scheduled Continue to eat healthy: low salt and heart healthy meals with fruits, vegetables, whole grains, lean protein and limit fats and sugars. Discuss with your orthopedic provider the possibility of physical therapy for your hip pain. Continue to take medications as prescribed Continue to collaborate with clinic pharmacist regarding medications Follow  Up Plan: The patient has been provided with contact information for the care management team and has been advised to call with any health related questions or concerns.  The care management team will reach out to the patient again next month.     Plan:The patient has been provided with contact information for the care management team and has been advised to call with any health related questions or concerns.  and The care  management team will reach out to the patient again next month.  Thea Silversmith, RN, MSN, BSN, CCM Care Management Coordinator [facility] [phone]

## 2020-10-26 NOTE — Patient Instructions (Signed)
Visit Information  PATIENT GOALS:  Goals Addressed             This Visit's Progress    COMPLETED: Increase activity and improve endurance       Timeframe:  Long-Range Goal Priority:  Medium Start Date:  05/08/20                           Expected End Date:  08/07/20                     Follow up date 08/08/20  She reports increase/Improved activity and endurance post covid pneumonia. She is also back to work.         Track and Manage My Blood Pressure-Hypertension   On track    Timeframe:  Long-Range Goal Priority:  Medium Start Date:  05/08/20                           Expected End Date:  03/17/21                    Follow Up Date 11/20/20  Patient Goals: Call your orthopedic doctor if hip pain worsens or does not improve. Call your insurance provider to assist with finding a physical therapist provider in network. Continue to check blood pressure periodically as recommended by provider and take log to provider appointments. Attend provider visits as scheduled Continue to eat healthy: low salt and heart healthy meals with fruits, vegetables, whole grains, lean protein and limit fats and sugars. Discuss with your orthopedic provider the possibility of physical therapy for your hip pain. Continue to take medications as prescribed Continue to collaborate with clinic pharmacist regarding medications        Patient verbalizes understanding of instructions provided today and agrees to view in [place].   Telephone follow up appointment with care management team member scheduled for:11/20/20 The patient has been provided with contact information for the care management team and has been advised to call with any health related questions or concerns.   Thea Silversmith, RN, MSN, BSN, CCM Care Management Coordinator [facility] [phone]

## 2020-10-30 ENCOUNTER — Telehealth: Payer: Self-pay | Admitting: *Deleted

## 2020-10-30 NOTE — Telephone Encounter (Signed)
Contact Type Call Who Is Calling Patient / Member / Family / Caregiver Call Type Triage / Clinical Relationship To Patient Self Return Phone Number [phone] (Primary) Chief Complaint Heart palpitations or irregular heartbeat Reason for Call Symptomatic / Request for Health Information Initial Comment Caller wants to make an appointment. Caller has heart palpitations and high blood pressure. Translation No Nurse Assessment Nurse: Zenia Resides, RN, Diane Date/Time Eilene Ghazi Time): 10/30/2020 9:30:59 AM Confirm and document reason for call. If symptomatic, describe symptoms. ---Caller states BP is 135/65 HR 65. O2 98%. No palpitations at this time. No chest pain or sob. Pt. is concerned about fluctuation in VS. Only fluctuates 5-10 points. Hx of HTN. Yesterday BP was 150/ Does the patient have any new or worsening symptoms? ---Yes Will a triage be completed? ---Yes Related visit to physician within the last 2 weeks? ---No Does the PT have any chronic conditions? (i.e. diabetes, asthma, this includes High risk factors for pregnancy, etc.) ---Yes List chronic conditions. ---HTN, acid reflux, arthritis Is this a behavioral health or substance abuse call? ---No Guidelines Guideline Title Affirmed Question Affirmed Notes Nurse Date/Time (Eastern Time) Blood Pressure - High [id number] Systolic BP >= [id number] OR Diastolic >= 80 AND [id number] taking BP medications Zenia Resides, RN, Diane 10/30/2020 9:35:39 AM Disp. Time Eilene Ghazi Time) Disposition Final User 10/30/2020 9:37:55 AM See PCP within 2 Weeks Yes Zenia Resides, RN, Diane

## 2020-10-30 NOTE — Telephone Encounter (Signed)
Patient was already scheduled for 11/01/20

## 2020-10-30 NOTE — Progress Notes (Signed)
[facility] [place], [place], [place] [address] [phone]  Date:  11/01/2020   Name:  Stephanie Parks   DOB:  August 19, 1945   MRN:  [id number]  PCP:  [name], MD    Chief Complaint: Palpitations (Ongoing sxs)   History of Present Illness:  Stephanie Parks is a 75 y.o. very pleasant female patient who presents with the following:  Pt seen today for a BP check and with concern of palpitations and chest discomfort Last visit with myself was in July of this year; hypertension, hyperlipidemia, allergic rhinitis, OSA on CPAP  She has tended to have frequent ER visits for chest pain  We tried putting her on buspar for anxiety but she had c/o side effects and stopped using it  She did have covid 53 and had a PE in January of this year- she was on oxygen temporarily for desats  She was treated with eliquis for 3 months- now completed treatment   Today pt notes that "my body is racked with pain all over" She notes that she has had chest pains again for 1-2 weeks She would like a chest x-ray Most recent chest x-ray one month ago- negative  She is being treated by ortho right now for shoulder and hip pain- has had MRI of both and also recently had a hip steroid injection.  She is doing physical therapy for a right rotator cuff injury  She notes intermittent chest pain- this may occur up to about 3 times a day and typically seems to last 10- 15 minutes  Non- exertional, can happen any time  She notes that her heart rate seems to speed up spontaneously and then will go back to  normal   She also had a heart cath in November of 2020 per [facility]:   Discussed with patient (but may still be drowsy).   Attempted but were unable to contact family.   Angiographically normal coronary arteries.   Maximize medical therapy.   She has not seen her cardiologist since March  She is taking potassium 40 meq daily now- will  check K level and refill to reflect actual use as needed   Lab Results  Component Value Date   HGBA1C 5.9 06/02/2019   Needs flu shot- declines  Needs pneumonia vaccine-declines today  CMP, CBC on chart from last month   Patient Active Problem List   Diagnosis Date Noted   Unilateral primary osteoarthritis, left hip 10/18/2020   Greater trochanteric bursitis of left hip 10/18/2020   Headache 06/07/2020   Acute respiratory failure with hypoxia ([facility]) 05/02/2020   Post-COVID chronic dyspnea 05/02/2020   Pulsatile neck mass 04/26/2020   Pneumonia due to COVID-19 virus 03/12/2020   Headache disorder 01/04/2020   Seasonal allergic conjunctivitis 10/04/2019   Dysfunction of right eustachian tube 10/04/2019   Pre-diabetes 06/02/2019   Seasonal and perennial allergic rhinitis 12/22/2018   Heart palpitations 12/22/2018   History of chest pain 07/20/2018   Brain aneurysm 07/20/2018   Dyslipidemia 05/04/2018   Bronchitis 04/23/2018   Tendonitis, Achilles, right 12/08/2017   Onychomycosis 12/08/2017   Unilateral primary osteoarthritis, left knee 10/07/2017   Ingrown toenail 08/10/2017   Coughing 04/24/2017   CAD (coronary artery disease) 03/01/2015   Aneurysm ([facility]) 03/01/2015   Dizziness 03/01/2015   Paresthesia of arm 03/01/2015   Seasonal allergic rhinitis 03/01/2015   Achalasia 09/28/2012   CN (constipation) 09/28/2012  Cephalalgia 08/24/2012   Essential hypertension 04/19/2011   Hyperlipidemia 04/19/2011   GERD (gastroesophageal reflux disease) 04/19/2011   Chest pain 04/04/2011    Past Medical History:  Diagnosis Date   3-vessel CAD 03/01/2015   Achalasia 09/28/2012   Allergic rhinitis 03/01/2015   Anemia    Aneurysm ([facility]) 03/01/2015   BP (high blood pressure) 03/01/2015   Bronchitis 04/23/2018   Cephalalgia 08/24/2012   Overview:  ICD-10 cut over     Chest pain 04/04/2011   CN (constipation) 09/28/2012   Coughing 04/24/2017   Decreased potassium in the blood 03/01/2015    Diverticulosis    Dizziness 03/01/2015   GERD (gastroesophageal reflux disease)    Hiatal hernia    Hypercholesterolemia 03/01/2015   Hyperlipidemia    Hypertension    Ingrown toenail 08/10/2017   Inguinal hernia    Migraine headache    Onychomycosis 12/08/2017   Paresthesia of arm 03/01/2015   Schatzki's ring    Tendonitis, Achilles, right 12/08/2017   Unilateral primary osteoarthritis, left knee 10/07/2017    Past Surgical History:  Procedure Laterality Date   ABDOMINAL HYSTERECTOMY     BACK SURGERY     x 3   CARPAL TUNNEL RELEASE     left wrist   CERVICAL SPINE SURGERY     HEMORRHOID SURGERY      Social History   Tobacco Use   Smoking status: Former    Packs/day: 0.50    Years: 20.00    Pack years: 10.00    Types: Cigarettes    Quit date: 02/19/1984    Years since quitting: 36.7   Smokeless tobacco: Never  Vaping Use   Vaping Use: Never used  Substance Use Topics   Alcohol use: No   Drug use: No    Family History  Problem Relation Age of Onset   Allergic rhinitis Sister    Diabetes Sister    Hypertension Sister    Heart attack Father 47   Heart disease Father    Stomach cancer Maternal Grandmother    Heart disease Mother    Colon cancer Neg Hx    Angioedema Neg Hx    Asthma Neg Hx    Eczema Neg Hx    Urticaria Neg Hx    Immunodeficiency Neg Hx    Breast cancer Neg Hx    Migraines Neg Hx    Headache Neg Hx     Allergies  Allergen Reactions   Shellfish Allergy Hives and Swelling   Ace Inhibitors Other (See Comments)    Pt cannot recall her reaction but tolerates arb    Gabapentin Other (See Comments)    headaches   Pitavastatin     Unknown reaction    Topiramate     Heart race,    Sulfa Antibiotics Other (See Comments) and Rash    Fine bumps    Medication list has been reviewed and updated.  Current Outpatient Medications on File Prior to Visit  Medication Sig Dispense Refill   acetaminophen (TYLENOL) 325 MG tablet Take 650 mg by mouth.  For headaches     Calcium Carb-Cholecalciferol (CALCIUM 600 + D PO) Take 1 tablet by mouth daily.     ELDERBERRY PO Take 50 mg by mouth daily. Also contains zinc and vitamin C     EPINEPHrine (EPIPEN 2-PAK) 0.3 mg/0.3 mL IJ SOAJ injection Inject 0.3 mLs (0.3 mg total) into the muscle as needed for anaphylaxis. Per allergen immunotherapy protocol 1 each 2   fluticasone (FLONASE)  50 MCG/ACT nasal spray USE 2 SPRAYS IN NOSTRIL(S) DAILY AS NEEDED.  In the right nostril point the applicator out toward your right ear.  In the left nostril point the applicator out toward her left ear (Patient taking differently: Place 2 sprays into both nostrils daily as needed for allergies.) 16 g 9   Galcanezumab-gnlm (EMGALITY) 120 MG/ML SOAJ Inject 120 mg into the skin every 30 (thirty) days. 1.12 mL 11   losartan (COZAAR) 100 MG tablet Take 1 tablet (100 mg total) by mouth daily. 90 tablet 1   METAMUCIL FIBER PO Take by mouth.     metoprolol succinate (TOPROL-XL) 25 MG 24 hr tablet Take 0.5 tablets (12.5 mg total) by mouth daily. 45 tablet 3   Multiple Vitamins-Minerals (MULTIVITAL) tablet Take 1 tablet by mouth daily.      omeprazole (PRILOSEC) 40 MG capsule Take 40 mg by mouth daily.     potassium chloride SA (KLOR-CON M20) 20 MEQ tablet TAKE 1 TABLET BY MOUTH EVERY DAY 90 tablet 1   rosuvastatin (CRESTOR) 10 MG tablet TAKE 1 TABLET BY MOUTH EVERY DAY (Patient taking differently: Take 10 mg by mouth daily.) 90 tablet 3   Specialty Vitamins Products (MAGNESIUM, AMINO ACID CHELATE,) 133 MG tablet Take 1 tablet by mouth daily. Dose unknown     UBRELVY 100 MG TABS TAKE 1 TABLET BY MOUTH AS NEEDED. CAN TAKE 1 ADDITIONAL TABLET IN 2 HOURS IF NEEDED AFTER INITIAL DOSE. NO MORE THAN 2 TABLETS IN 24 HOURS. 10 tablet 5   No current facility-administered medications on file prior to visit.    Review of Systems:  As per HPI- otherwise negative.   Physical Examination: Vitals:   11/01/20 0900  BP: (!) 142/72  Pulse:  66  Resp: 17  Temp: 98 F (36.7 C)  SpO2: 97%   Vitals:   11/01/20 0900  Weight: 193 lb (87.5 kg)  Height: 5' 5.5" (1.664 m)   Body mass index is 31.63 kg/m. Ideal Body Weight: Weight in (lb) to have BMI = 25: 152.2  GEN: no acute distress.  Overweight, looks well and her normal self HEENT: Atraumatic, Normocephalic.  Bilateral TM wnl, oropharynx normal.  PEERL,EOMI.   Ears and Nose: No external deformity. CV: RRR, No M/G/R. No JVD. No thrill. No extra heart sounds. PULM: CTA B, no wheezes, crackles, rhonchi. No retractions. No resp. distress. No accessory muscle use. ABD: S, NT, ND, +BS. No rebound. No HSM. EXTR: No c/c/e PSYCH: Normally interactive. Conversant.  I am able to reproduce her chest pain by pressing on her chest wall today Her right shoulder is painful with range of motion, "clunk" sensation consistent with rotator cuff injury  EKG: SR, read as low voltage precordial leads C/w EKG from June no significant change noted  Assessment and Plan: Chest pain, unspecified type - Plan: EKG 12-Lead, DG Chest 2 View, D-Dimer, Quantitative, Basic metabolic panel, Troponin I (High Sensitivity), CANCELED: Troponin I (High Sensitivity), CANCELED: Troponin I (High Sensitivity)  Chronic right shoulder pain  Patient seen today with concern for chest pain.  She had a negative cardiac cath a little less than 2 years ago.  She has been seen several times over the last couple of years with chest pain.  At this time it seems her chest pain is likely musculoskeletal.  However, she does have a history of pulmonary embolism which may need follow-up  We decided to obtain a troponin, D-dimer, and chest film.  If D-dimer is positive she would  like to proceed to a CT scan  She is taking potassium 40 meq daily now- will check K level and refill to reflect actual use as needed    This visit occurred during the SARS-CoV-2 public health emergency.  Safety protocols were in place, including  screening questions prior to the visit, additional usage of staff PPE, and extensive cleaning of exam room while observing appropriate contact time as indicated for disinfecting solutions.   Signed Lamar Blinks, MD  Received her labs and x-ray as below  Her D-dimer is trending down, it was 2.8 back in February.  However, it is still elevated  She is taking 40 mEq of potassium daily- despite this, potassium remains slightly low She has been to see nephrology about her hypokalemia, but I do not receive their note.  We will request today  Called patient to discuss Continue current dose of potassium D-dimer is still elevated.  We will plan to pursue a CT angiogram stat Chest x-ray is negative His symptoms are worsening or any particular changes please go to the emergency room  Results for orders placed or performed in visit on 11/01/20  D-Dimer, Quantitative  Result Value Ref Range   D-Dimer, Quant 1.13 (H) <0.50 mcg/mL FEU  Basic metabolic panel  Result Value Ref Range   Sodium 141 135 - 145 mEq/L   Potassium 3.2 (L) 3.5 - 5.1 mEq/L   Chloride 103 96 - 112 mEq/L   CO2 32 19 - 32 mEq/L   Glucose, Bld 86 70 - 99 mg/dL   BUN 16 6 - 23 mg/dL   Creatinine, Ser 0.82 0.40 - 1.20 mg/dL   GFR 70.15 >60.00 mL/min   Calcium 9.0 8.4 - 10.5 mg/dL  Troponin I (High Sensitivity)  Result Value Ref Range   High Sens Troponin I 6 2 - 17 ng/L    DG Chest 2 View  Result Date: 11/01/2020 CLINICAL DATA:  Chest pain, back pain EXAM: CHEST - 2 VIEW COMPARISON:  10/04/2020 FINDINGS: Heart is upper limits normal in size. Areas of scarring in the lungs bilaterally. No acute confluent opacities or effusions. No acute bony abnormality. IMPRESSION: No active cardiopulmonary disease. Electronically Signed   By: Rolm Baptise M.D.   On: 11/01/2020 10:08   MR Hip Left w/o contrast  Result Date: 11/01/2020 CLINICAL DATA:  Hip pain EXAM: MR OF THE LEFT HIP WITHOUT CONTRAST TECHNIQUE: Multiplanar,  multisequence MR imaging was performed. No intravenous contrast was administered. COMPARISON:  None. FINDINGS: Bones: There is no evidence of acute fracture, dislocation or avascular necrosis. No focal bone lesion. Mild right SI joint degenerative change. Mild findings of osteitis pubis. Lower lumbar spine degenerative changes. Articular cartilage and labrum Articular cartilage: Severe chondrosis with joint space narrowing superior laterally, and underlying subchondral edema in the acetabulum and superolateral femoral head. Possible small subchondral fracture of the superolateral femoral head. Labrum: Degenerative superior labral tearing anteriorly and posteriorly. Joint or bursal effusion Joint effusion: Small left hip joint effusion. Bursae: No evidence of trochanteric bursitis. Muscles and tendons Muscles and tendons: The gluteal tendons are intact. The proximal hamstrings are intact.Very mild reactive intramuscular edema within the left hip adductors. No muscle atrophy of edema. Other findings Miscellaneous: The visualized internal pelvic contents appear unremarkable. IMPRESSION: Severe left hip osteoarthritis with superolateral cartilage loss and associated subchondral bony edema. Possible small subchondral fracture of the superolateral femoral head. Degenerative superior labral tearing.  Small hip effusion. Mild reactive intramuscular edema within the left hip adductors. Electronically Signed  By: Maurine Simmering M.D.   On: 11/01/2020 12:26

## 2020-10-30 NOTE — Patient Instructions (Addendum)
Good to see you today - I will be in touch with your labs and your chest x-ray If your D dimer is still elevated we can set up a CT scan of your lungs for you

## 2020-10-31 ENCOUNTER — Ambulatory Visit
Admission: RE | Admit: 2020-10-31 | Discharge: 2020-10-31 | Disposition: A | Discharge status: Home / Self Care | Payer: Medicare Other | Source: Ambulatory Visit | Attending: Orthopaedic Surgery | Admitting: Orthopaedic Surgery

## 2020-10-31 ENCOUNTER — Other Ambulatory Visit: Payer: Self-pay

## 2020-10-31 DIAGNOSIS — R600 Localized edema: Secondary | ICD-10-CM | POA: Diagnosis not present

## 2020-10-31 DIAGNOSIS — M25452 Effusion, left hip: Secondary | ICD-10-CM | POA: Diagnosis not present

## 2020-10-31 DIAGNOSIS — M1612 Unilateral primary osteoarthritis, left hip: Secondary | ICD-10-CM | POA: Diagnosis not present

## 2020-10-31 DIAGNOSIS — M533 Sacrococcygeal disorders, not elsewhere classified: Secondary | ICD-10-CM | POA: Diagnosis not present

## 2020-11-01 ENCOUNTER — Ambulatory Visit (INDEPENDENT_AMBULATORY_CARE_PROVIDER_SITE_OTHER): Payer: Medicare Other | Admitting: Family Medicine

## 2020-11-01 ENCOUNTER — Ambulatory Visit (HOSPITAL_BASED_OUTPATIENT_CLINIC_OR_DEPARTMENT_OTHER)
Admission: RE | Admit: 2020-11-01 | Discharge: 2020-11-01 | Disposition: A | Discharge status: Home / Self Care | Payer: Medicare Other | Source: Ambulatory Visit | Attending: Family Medicine | Admitting: Family Medicine

## 2020-11-01 VITALS — BP 142/72 | HR 66 | Temp 98.0°F | Resp 17 | Ht 65.5 in | Wt 193.0 lb

## 2020-11-01 DIAGNOSIS — E876 Hypokalemia: Secondary | ICD-10-CM | POA: Diagnosis not present

## 2020-11-01 DIAGNOSIS — R7989 Other specified abnormal findings of blood chemistry: Secondary | ICD-10-CM

## 2020-11-01 DIAGNOSIS — R079 Chest pain, unspecified: Secondary | ICD-10-CM | POA: Insufficient documentation

## 2020-11-01 DIAGNOSIS — G8929 Other chronic pain: Secondary | ICD-10-CM | POA: Diagnosis not present

## 2020-11-01 DIAGNOSIS — M25511 Pain in right shoulder: Secondary | ICD-10-CM

## 2020-11-01 DIAGNOSIS — R0602 Shortness of breath: Secondary | ICD-10-CM

## 2020-11-01 LAB — BASIC METABOLIC PANEL
BUN: 16 mg/dL (ref 6–23)
CO2: 32 mEq/L (ref 19–32)
Calcium: 9 mg/dL (ref 8.4–10.5)
Chloride: 103 mEq/L (ref 96–112)
Creatinine, Ser: 0.82 mg/dL (ref 0.40–1.20)
GFR: 70.15 mL/min (ref >60.00)
Glucose, Bld: 86 mg/dL (ref 70–99)
Potassium: 3.2 mEq/L — ABNORMAL LOW (ref 3.5–5.1)
Sodium: 141 mEq/L (ref 135–145)

## 2020-11-01 LAB — D-DIMER, QUANTITATIVE: D-Dimer, Quant: 1.13 mcg/mL FEU — ABNORMAL HIGH (ref <0.50)

## 2020-11-01 LAB — TROPONIN I (HIGH SENSITIVITY): High Sens Troponin I: 6 ng/L (ref 2–17)

## 2020-11-01 MED ORDER — POTASSIUM CHLORIDE CRYS ER 20 MEQ PO TBCR: EXTENDED_RELEASE_TABLET | ORAL | 1 refills | Status: DC

## 2020-11-02 ENCOUNTER — Other Ambulatory Visit: Payer: Self-pay

## 2020-11-02 ENCOUNTER — Encounter: Payer: Self-pay | Admitting: Family Medicine

## 2020-11-02 ENCOUNTER — Ambulatory Visit (INDEPENDENT_AMBULATORY_CARE_PROVIDER_SITE_OTHER): Payer: Medicare Other | Admitting: Orthopaedic Surgery

## 2020-11-02 ENCOUNTER — Ambulatory Visit
Admission: RE | Admit: 2020-11-02 | Discharge: 2020-11-02 | Disposition: A | Discharge status: Home / Self Care | Payer: Medicare Other | Source: Ambulatory Visit | Attending: Family Medicine | Admitting: Family Medicine

## 2020-11-02 ENCOUNTER — Encounter: Payer: Self-pay | Admitting: Orthopaedic Surgery

## 2020-11-02 DIAGNOSIS — R0602 Shortness of breath: Secondary | ICD-10-CM

## 2020-11-02 DIAGNOSIS — M1612 Unilateral primary osteoarthritis, left hip: Secondary | ICD-10-CM | POA: Diagnosis not present

## 2020-11-02 DIAGNOSIS — I700 Atherosclerosis of aorta: Secondary | ICD-10-CM | POA: Diagnosis not present

## 2020-11-02 DIAGNOSIS — R079 Chest pain, unspecified: Secondary | ICD-10-CM

## 2020-11-02 DIAGNOSIS — R7989 Other specified abnormal findings of blood chemistry: Secondary | ICD-10-CM

## 2020-11-02 DIAGNOSIS — K449 Diaphragmatic hernia without obstruction or gangrene: Secondary | ICD-10-CM | POA: Diagnosis not present

## 2020-11-02 DIAGNOSIS — Z86711 Personal history of pulmonary embolism: Secondary | ICD-10-CM | POA: Diagnosis not present

## 2020-11-02 MED ORDER — IOPAMIDOL (ISOVUE-370) INJECTION 76%
75.0000 mL | Freq: Once | INTRAVENOUS | Status: AC | PRN
  Administered 2020-11-02: 75 mL via INTRAVENOUS

## 2020-11-02 NOTE — Progress Notes (Signed)
Office Visit Note   Patient: Stephanie Parks           Date of Birth: [birth year]           MRN: [id number] Visit Date: 11/02/2020              Requested by: [name], MD [address] [place],  [place] [address] PCP: [name], MD   Assessment & Plan: Visit Diagnoses:  1. Unilateral primary osteoarthritis, left hip     Plan: Stephanie Parks had an MRI scan of her left hip demonstrating severe chondrosis with joint space narrowing superiorly and laterally.  There was underlying subchondral edema within the acetabulum and the superior lateral femoral head with a possible small subchondral fracture of the superior lateral femoral head.  There was also degenerative superior labral tearing anteriorly and posteriorly with a small hip in the effusion.  Gluteal tendons were intact as were the proximal hamstrings.  No muscle atrophy.  Internal pelvic contents were within normal limits.  Long discussion regarding all of the above.  She is already had an intra-articular cortisone injection without much relief.  I think at this point is worth having Dr. Ninfa Linden discussed hip replacement with her.  She agrees so we will set up the appointment  Follow-Up Instructions: Return Referral to Dr. Ninfa Linden for consideration of left total hip replacement.   Orders:  Orders Placed This Encounter  Procedures   Ambulatory referral to Orthopedic Surgery   No orders of the defined types were placed in this encounter.     Procedures: No procedures performed   Clinical Data: No additional findings.   Subjective: Chief Complaint  Patient presents with   Left Hip - Follow-up    MRI review  Patient presents today for follow up on her left hip. She had an MRI and is here today for those results.  No change in symptoms.  She is experiencing lateral left hip pain as well as groin and anterior thigh pain.  Uses a cane for ambulation  HPI  Review of  Systems   Objective: Vital Signs: There were no vitals taken for this visit.  Physical Exam Constitutional:      Appearance: She is well-developed.  Pulmonary:     Effort: Pulmonary effort is normal.  Skin:    General: Skin is warm and dry.  Neurological:     Mental Status: She is alert and oriented to person, place, and time.  Psychiatric:        Behavior: Behavior normal.    Ortho Exam awake alert and oriented x3.  Comfortable sitting there was some limited range of motion of her left hip on the extreme of internal and external rotation with pain.  No discomfort over the lateral aspect of her hip.  Uses a cane for ambulation  Specialty Comments:  No specialty comments available.  Imaging: CT Angio Chest W/Cm &/Or Wo Cm  Result Date: 11/02/2020 CLINICAL DATA:  PE suspected, chest pain, shortness of breath, history of pulmonary embolism EXAM: CT ANGIOGRAPHY CHEST WITH CONTRAST TECHNIQUE: Multidetector CT imaging of the chest was performed using the standard protocol during bolus administration of intravenous contrast. Multiplanar CT image reconstructions and MIPs were obtained to evaluate the vascular anatomy. CONTRAST:  72m ISOVUE-370 IOPAMIDOL (ISOVUE-370) INJECTION 76% COMPARISON:  03/13/2020 FINDINGS: Cardiovascular: Satisfactory opacification of the pulmonary arteries to the segmental level. No evidence of pulmonary embolism. Normal heart size. No pericardial effusion. Aortic atherosclerosis. Mediastinum/Nodes:  No enlarged mediastinal, hilar, or axillary lymph nodes. Small calcified mediastinal and right hilar lymph nodes, in keeping with prior granulomatous infection small hiatal hernia. Thyroid gland, trachea, and esophagus demonstrate no significant findings. Lungs/Pleura: Minimal irregular peripheral ground-glass and bandlike opacity (series 13, image 49). No pleural effusion or pneumothorax. Upper Abdomen: No acute abnormality. Musculoskeletal: No chest wall abnormality. No  acute or significant osseous findings. Review of the MIP images confirms the above findings. IMPRESSION: 1. Negative examination for pulmonary embolism. Previously identified right upper lobe embolus is resolved. 2. Minimal irregular peripheral ground-glass and bandlike opacity nonspecific sequelae of prior infection or inflammation, particularly suggestive of chronic sequelae of COVID airspace disease. 3. Small hiatal hernia. Aortic Atherosclerosis (ICD10-I70.0). Electronically Signed   By: Eddie Candle M.D.   On: 11/02/2020 12:13     PMFS History: Patient Active Problem List   Diagnosis Date Noted   Unilateral primary osteoarthritis, left hip 10/18/2020   Greater trochanteric bursitis of left hip 10/18/2020   Headache 06/07/2020   Acute respiratory failure with hypoxia ([facility]) 05/02/2020   Post-COVID chronic dyspnea 05/02/2020   Pulsatile neck mass 04/26/2020   Pneumonia due to COVID-19 virus 03/12/2020   Headache disorder 01/04/2020   Seasonal allergic conjunctivitis 10/04/2019   Dysfunction of right eustachian tube 10/04/2019   Pre-diabetes 06/02/2019   Seasonal and perennial allergic rhinitis 12/22/2018   Heart palpitations 12/22/2018   History of chest pain 07/20/2018   Brain aneurysm 07/20/2018   Dyslipidemia 05/04/2018   Bronchitis 04/23/2018   Tendonitis, Achilles, right 12/08/2017   Onychomycosis 12/08/2017   Unilateral primary osteoarthritis, left knee 10/07/2017   Ingrown toenail 08/10/2017   Coughing 04/24/2017   CAD (coronary artery disease) 03/01/2015   Aneurysm ([facility]) 03/01/2015   Dizziness 03/01/2015   Paresthesia of arm 03/01/2015   Seasonal allergic rhinitis 03/01/2015   Achalasia 09/28/2012   CN (constipation) 09/28/2012   Cephalalgia 08/24/2012   Essential hypertension 04/19/2011   Hyperlipidemia 04/19/2011   GERD (gastroesophageal reflux disease) 04/19/2011   Chest pain 04/04/2011   Past Medical History:  Diagnosis Date   3-vessel CAD 03/01/2015    Achalasia 09/28/2012   Allergic rhinitis 03/01/2015   Anemia    Aneurysm ([facility]) 03/01/2015   BP (high blood pressure) 03/01/2015   Bronchitis 04/23/2018   Cephalalgia 08/24/2012   Overview:  ICD-10 cut over     Chest pain 04/04/2011   CN (constipation) 09/28/2012   Coughing 04/24/2017   Decreased potassium in the blood 03/01/2015   Diverticulosis    Dizziness 03/01/2015   GERD (gastroesophageal reflux disease)    Hiatal hernia    Hypercholesterolemia 03/01/2015   Hyperlipidemia    Hypertension    Ingrown toenail 08/10/2017   Inguinal hernia    Migraine headache    Onychomycosis 12/08/2017   Paresthesia of arm 03/01/2015   Schatzki's ring    Tendonitis, Achilles, right 12/08/2017   Unilateral primary osteoarthritis, left knee 10/07/2017    Family History  Problem Relation Age of Onset   Allergic rhinitis Sister    Diabetes Sister    Hypertension Sister    Heart attack Father 81   Heart disease Father    Stomach cancer Maternal Grandmother    Heart disease Mother    Colon cancer Neg Hx    Angioedema Neg Hx    Asthma Neg Hx    Eczema Neg Hx    Urticaria Neg Hx    Immunodeficiency Neg Hx    Breast cancer Neg Hx    Migraines  Neg Hx    Headache Neg Hx     Past Surgical History:  Procedure Laterality Date   ABDOMINAL HYSTERECTOMY     BACK SURGERY     x 3   CARPAL TUNNEL RELEASE     left wrist   CERVICAL SPINE SURGERY     HEMORRHOID SURGERY     Social History   Occupational History   Occupation: Surveyor, 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

## 2020-11-07 ENCOUNTER — Other Ambulatory Visit: Payer: Self-pay

## 2020-11-07 ENCOUNTER — Emergency Department (HOSPITAL_BASED_OUTPATIENT_CLINIC_OR_DEPARTMENT_OTHER)
Admission: EM | Admit: 2020-11-07 | Discharge: 2020-11-07 | Disposition: A | Discharge status: Home / Self Care | Payer: Medicare Other | Attending: Emergency Medicine | Admitting: Emergency Medicine

## 2020-11-07 ENCOUNTER — Encounter (HOSPITAL_BASED_OUTPATIENT_CLINIC_OR_DEPARTMENT_OTHER): Payer: Self-pay | Admitting: *Deleted

## 2020-11-07 ENCOUNTER — Institutional Professional Consult (permissible substitution): Payer: Medicare Other | Admitting: Neurology

## 2020-11-07 ENCOUNTER — Emergency Department (HOSPITAL_BASED_OUTPATIENT_CLINIC_OR_DEPARTMENT_OTHER): Payer: Medicare Other

## 2020-11-07 DIAGNOSIS — R0789 Other chest pain: Secondary | ICD-10-CM | POA: Diagnosis not present

## 2020-11-07 DIAGNOSIS — Z8616 Personal history of COVID-19: Secondary | ICD-10-CM | POA: Diagnosis not present

## 2020-11-07 DIAGNOSIS — R42 Dizziness and giddiness: Secondary | ICD-10-CM | POA: Insufficient documentation

## 2020-11-07 DIAGNOSIS — Z87891 Personal history of nicotine dependence: Secondary | ICD-10-CM | POA: Insufficient documentation

## 2020-11-07 DIAGNOSIS — H40023 Open angle with borderline findings, high risk, bilateral: Secondary | ICD-10-CM | POA: Diagnosis not present

## 2020-11-07 DIAGNOSIS — H25013 Cortical age-related cataract, bilateral: Secondary | ICD-10-CM | POA: Diagnosis not present

## 2020-11-07 DIAGNOSIS — R519 Headache, unspecified: Secondary | ICD-10-CM | POA: Insufficient documentation

## 2020-11-07 DIAGNOSIS — I2510 Atherosclerotic heart disease of native coronary artery without angina pectoris: Secondary | ICD-10-CM | POA: Diagnosis not present

## 2020-11-07 DIAGNOSIS — H2513 Age-related nuclear cataract, bilateral: Secondary | ICD-10-CM | POA: Diagnosis not present

## 2020-11-07 DIAGNOSIS — H18413 Arcus senilis, bilateral: Secondary | ICD-10-CM | POA: Diagnosis not present

## 2020-11-07 DIAGNOSIS — I10 Essential (primary) hypertension: Secondary | ICD-10-CM | POA: Insufficient documentation

## 2020-11-07 DIAGNOSIS — H2511 Age-related nuclear cataract, right eye: Secondary | ICD-10-CM | POA: Diagnosis not present

## 2020-11-07 LAB — URINALYSIS, ROUTINE W REFLEX MICROSCOPIC
Bilirubin Urine: NEGATIVE
Glucose, UA: NEGATIVE mg/dL
Hgb urine dipstick: NEGATIVE
Ketones, ur: NEGATIVE mg/dL
Leukocytes,Ua: NEGATIVE
Nitrite: NEGATIVE
Protein, ur: NEGATIVE mg/dL
Specific Gravity, Urine: 1.015 (ref 1.005–1.030)
pH: 7 (ref 5.0–8.0)

## 2020-11-07 LAB — CBC WITH DIFFERENTIAL/PLATELET
Abs Immature Granulocytes: 0.01 10*3/uL (ref 0.00–0.07)
Basophils Absolute: 0 10*3/uL (ref 0.0–0.1)
Basophils Relative: 1 %
Eosinophils Absolute: 0.3 10*3/uL (ref 0.0–0.5)
Eosinophils Relative: 5 %
HCT: 33.3 % — ABNORMAL LOW (ref 36.0–46.0)
Hemoglobin: 10.9 g/dL — ABNORMAL LOW (ref 12.0–15.0)
Immature Granulocytes: 0 %
Lymphocytes Relative: 36 %
Lymphs Abs: 2.4 10*3/uL (ref 0.7–4.0)
MCH: 28.8 pg (ref 26.0–34.0)
MCHC: 32.7 g/dL (ref 30.0–36.0)
MCV: 87.9 fL (ref 80.0–100.0)
Monocytes Absolute: 0.5 10*3/uL (ref 0.1–1.0)
Monocytes Relative: 7 %
Neutro Abs: 3.4 10*3/uL (ref 1.7–7.7)
Neutrophils Relative %: 51 %
Platelets: 250 10*3/uL (ref 150–400)
RBC: 3.79 MIL/uL — ABNORMAL LOW (ref 3.87–5.11)
RDW: 15.3 % (ref 11.5–15.5)
WBC: 6.6 10*3/uL (ref 4.0–10.5)
nRBC: 0 % (ref 0.0–0.2)

## 2020-11-07 LAB — COMPREHENSIVE METABOLIC PANEL
ALT: 14 U/L (ref 0–44)
AST: 18 U/L (ref 15–41)
Albumin: 3.4 g/dL — ABNORMAL LOW (ref 3.5–5.0)
Alkaline Phosphatase: 63 U/L (ref 38–126)
Anion gap: 6 (ref 5–15)
BUN: 16 mg/dL (ref 8–23)
CO2: 31 mmol/L (ref 22–32)
Calcium: 9 mg/dL (ref 8.9–10.3)
Chloride: 104 mmol/L (ref 98–111)
Creatinine, Ser: 0.86 mg/dL (ref 0.44–1.00)
GFR, Estimated: >60 mL/min (ref >60)
Glucose, Bld: 101 mg/dL — ABNORMAL HIGH (ref 70–99)
Potassium: 3.5 mmol/L (ref 3.5–5.1)
Sodium: 141 mmol/L (ref 135–145)
Total Bilirubin: 0.3 mg/dL (ref 0.3–1.2)
Total Protein: 6.9 g/dL (ref 6.5–8.1)

## 2020-11-07 LAB — TROPONIN I (HIGH SENSITIVITY): Troponin I (High Sensitivity): 5 ng/L (ref <18)

## 2020-11-07 MED ORDER — METOCLOPRAMIDE HCL 5 MG/ML IJ SOLN
10.0000 mg | Freq: Once | INTRAMUSCULAR | Status: AC
  Administered 2020-11-07: 10 mg via INTRAVENOUS
  Filled 2020-11-07: qty 2

## 2020-11-07 MED ORDER — HYDRALAZINE HCL 20 MG/ML IJ SOLN
10.0000 mg | Freq: Once | INTRAMUSCULAR | Status: AC
  Administered 2020-11-07: 10 mg via INTRAVENOUS
  Filled 2020-11-07: qty 1

## 2020-11-07 MED ORDER — DIPHENHYDRAMINE HCL 50 MG/ML IJ SOLN
25.0000 mg | Freq: Once | INTRAMUSCULAR | Status: AC
  Administered 2020-11-07: 25 mg via INTRAVENOUS
  Filled 2020-11-07: qty 1

## 2020-11-07 NOTE — Discharge Instructions (Signed)
Your CT scan and labs were unremarkable today.  Please follow-up with your doctor and keep a detailed blood pressure log.  Please continue taking losartan right now  See your doctor in a week  Return to ER if you have worse headache, dizziness, chest pain.

## 2020-11-07 NOTE — ED Triage Notes (Signed)
She had an eye exam today. She was given eye drops. She took a shower and went to bed when she got home. She turned over in bed and woke up with dizziness, light headedness and double vision when she got up and walked around.

## 2020-11-07 NOTE — ED Provider Notes (Signed)
[facility] Provider Note   CSN: [id number] Arrival date & time: 11/07/20  2052     History Chief Complaint  Patient presents with   Hypertension    Stephanie Parks is a 75 y.o. female hx of HL, HTN, here presenting with headache and dizziness and hypertension.  Patient states that she went to the eye doctor for dilated eye exam earlier today.  She states that she went home and then had sudden onset headache.  She also felt very dizzy at that time.  She has chronic chest pain that did not get worse.  Denies any shortness of breath.  She states that she check her blood pressure was elevated around 160.  Patient takes losartan 100 mg and did take it today.  The history is provided by the patient.      Past Medical History:  Diagnosis Date   3-vessel CAD 03/01/2015   Achalasia 09/28/2012   Allergic rhinitis 03/01/2015   Anemia    Aneurysm ([place]) 03/01/2015   BP (high blood pressure) 03/01/2015   Bronchitis 04/23/2018   Cephalalgia 08/24/2012   Overview:  ICD-10 cut over     Chest pain 04/04/2011   CN (constipation) 09/28/2012   Coughing 04/24/2017   Decreased potassium in the blood 03/01/2015   Diverticulosis    Dizziness 03/01/2015   GERD (gastroesophageal reflux disease)    Hiatal hernia    Hypercholesterolemia 03/01/2015   Hyperlipidemia    Hypertension    Ingrown toenail 08/10/2017   Inguinal hernia    Migraine headache    Onychomycosis 12/08/2017   Paresthesia of arm 03/01/2015   Schatzki's ring    Tendonitis, Achilles, right 12/08/2017   Unilateral primary osteoarthritis, left knee 10/07/2017    Patient Active Problem List   Diagnosis Date Noted   Unilateral primary osteoarthritis, left hip 10/18/2020   Greater trochanteric bursitis of left hip 10/18/2020   Headache 06/07/2020   Acute respiratory failure with hypoxia ([place]) 05/02/2020   Post-COVID chronic dyspnea 05/02/2020   Pulsatile neck mass 04/26/2020   Pneumonia due to COVID-19 virus  03/12/2020   Headache disorder 01/04/2020   Seasonal allergic conjunctivitis 10/04/2019   Dysfunction of right eustachian tube 10/04/2019   Pre-diabetes 06/02/2019   Seasonal and perennial allergic rhinitis 12/22/2018   Heart palpitations 12/22/2018   History of chest pain 07/20/2018   Brain aneurysm 07/20/2018   Dyslipidemia 05/04/2018   Bronchitis 04/23/2018   Tendonitis, Achilles, right 12/08/2017   Onychomycosis 12/08/2017   Unilateral primary osteoarthritis, left knee 10/07/2017   Ingrown toenail 08/10/2017   Coughing 04/24/2017   CAD (coronary artery disease) 03/01/2015   Aneurysm ([place]) 03/01/2015   Dizziness 03/01/2015   Paresthesia of arm 03/01/2015   Seasonal allergic rhinitis 03/01/2015   Achalasia 09/28/2012   CN (constipation) 09/28/2012   Cephalalgia 08/24/2012   Essential hypertension 04/19/2011   Hyperlipidemia 04/19/2011   GERD (gastroesophageal reflux disease) 04/19/2011   Chest pain 04/04/2011    Past Surgical History:  Procedure Laterality Date   ABDOMINAL HYSTERECTOMY     BACK SURGERY     x 3   CARPAL TUNNEL RELEASE     left wrist   CERVICAL SPINE SURGERY     HEMORRHOID SURGERY       OB History   No obstetric history on file.     Family History  Problem Relation Age of Onset   Allergic rhinitis Sister    Diabetes Sister    Hypertension Sister    Heart  attack Father 51   Heart disease Father    Stomach cancer Maternal Grandmother    Heart disease Mother    Colon cancer Neg Hx    Angioedema Neg Hx    Asthma Neg Hx    Eczema Neg Hx    Urticaria Neg Hx    Immunodeficiency Neg Hx    Breast cancer Neg Hx    Migraines Neg Hx    Headache Neg Hx     Social History   Tobacco Use   Smoking status: Former    Packs/day: 0.50    Years: 20.00    Pack years: 10.00    Types: Cigarettes    Quit date: 02/19/1984    Years since quitting: 36.7   Smokeless tobacco: Never  Vaping Use   Vaping Use: Never used  Substance Use Topics   Alcohol  use: No   Drug use: No    Home Medications Prior to Admission medications   Medication Sig Start Date End Date Taking? Authorizing Provider  acetaminophen (TYLENOL) 325 MG tablet Take 650 mg by mouth. For headaches    [provider]  Calcium Carb-Cholecalciferol (CALCIUM 600 + D PO) Take 1 tablet by mouth daily.    [provider]  ELDERBERRY PO Take 50 mg by mouth daily. Also contains zinc and vitamin C    [provider]  EPINEPHrine (EPIPEN 2-PAK) 0.3 mg/0.3 mL IJ SOAJ injection Inject 0.3 mLs (0.3 mg total) into the muscle as needed for anaphylaxis. Per allergen immunotherapy protocol 10/04/19   Ambs, Kathrine Cords, FNP  fluticasone (FLONASE) 50 MCG/ACT nasal spray USE 2 SPRAYS IN NOSTRIL(S) DAILY AS NEEDED.  In the right nostril point the applicator out toward your right ear.  In the left nostril point the applicator out toward her left ear Patient taking differently: Place 2 sprays into both nostrils daily as needed for allergies. 10/04/19   Ambs, Kathrine Cords, FNP  Galcanezumab-gnlm (EMGALITY) 120 MG/ML SOAJ Inject 120 mg into the skin every 30 (thirty) days. 08/29/20   Melvenia Beam, MD  losartan (COZAAR) 100 MG tablet Take 1 tablet (100 mg total) by mouth daily. 06/21/20   Jettie Booze, MD  METAMUCIL FIBER PO Take by mouth.    [provider]  metoprolol succinate (TOPROL-XL) 25 MG 24 hr tablet Take 0.5 tablets (12.5 mg total) by mouth daily. 08/31/20   Copland, Gay Filler, MD  Multiple Vitamins-Minerals (MULTIVITAL) tablet Take 1 tablet by mouth daily.     [provider]  omeprazole (PRILOSEC) 40 MG capsule Take 40 mg by mouth daily. 10/05/20   [provider]  potassium chloride SA (KLOR-CON M20) 20 MEQ tablet TAKE 2 TABLETS BY MOUTH EVERY DAY 11/01/20   Copland, Gay Filler, MD  rosuvastatin (CRESTOR) 10 MG tablet TAKE 1 TABLET BY MOUTH EVERY DAY Patient taking differently: Take 10 mg by mouth daily. 01/11/20   Copland, Gay Filler, MD   Specialty Vitamins Products (MAGNESIUM, AMINO ACID CHELATE,) 133 MG tablet Take 1 tablet by mouth daily. Dose unknown    [provider]  UBRELVY 100 MG TABS TAKE 1 TABLET BY MOUTH AS NEEDED. CAN TAKE 1 ADDITIONAL TABLET IN 2 HOURS IF NEEDED AFTER INITIAL DOSE. NO MORE THAN 2 TABLETS IN 24 HOURS. 08/28/20   Melvenia Beam, MD    Allergies    Shellfish allergy, Ace inhibitors, Gabapentin, Pitavastatin, Topiramate, and Sulfa antibiotics  Review of Systems   Review of Systems  Neurological:  Positive for dizziness  and headaches.  All other systems reviewed and are negative.  Physical Exam Updated Vital Signs BP (!) 159/82 (BP Location: Right Arm)   Pulse (!) 102   Temp 98 F (36.7 C) (Oral)   Resp 17   Ht 5' 5.5" (1.664 m)   Wt 87.5 kg   SpO2 100%   BMI 31.61 kg/m   Physical Exam Vitals and nursing note reviewed.  Constitutional:      Appearance: Normal appearance.  HENT:     Head: Normocephalic.     Nose: Nose normal.     Mouth/Throat:     Mouth: Mucous membranes are moist.  Eyes:     Extraocular Movements: Extraocular movements intact.     Pupils: Pupils are equal, round, and reactive to light.  Cardiovascular:     Rate and Rhythm: Normal rate and regular rhythm.     Pulses: Normal pulses.     Heart sounds: Normal heart sounds.  Pulmonary:     Effort: Pulmonary effort is normal.     Breath sounds: Normal breath sounds.  Abdominal:     General: Abdomen is flat.     Palpations: Abdomen is soft.  Musculoskeletal:        General: Normal range of motion.     Cervical back: Normal range of motion and neck supple.  Skin:    General: Skin is warm.     Capillary Refill: Capillary refill takes less than 2 seconds.  Neurological:     General: No focal deficit present.     Mental Status: She is alert and oriented to person, place, and time.     Cranial Nerves: No cranial nerve deficit.     Sensory: No sensory deficit.     Motor: No weakness.     Coordination:  Coordination normal.     Comments: Normal strength and sensation bilaterally.  Normal finger-to-nose bilaterally.  Normal gait  Psychiatric:        Mood and Affect: Mood normal.        Behavior: Behavior normal.    ED Results / Procedures / Treatments   Labs (all labs ordered are listed, but only abnormal results are displayed) Labs Reviewed  URINALYSIS, ROUTINE W REFLEX MICROSCOPIC - Abnormal; Notable for the following components:      Result Value   Color, Urine STRAW (*)    All other components within normal limits  CBC WITH DIFFERENTIAL/PLATELET - Abnormal; Notable for the following components:   RBC 3.79 (*)    Hemoglobin 10.9 (*)    HCT 33.3 (*)    All other components within normal limits  COMPREHENSIVE METABOLIC PANEL - Abnormal; Notable for the following components:   Glucose, Bld 101 (*)    Albumin 3.4 (*)    All other components within normal limits  TROPONIN I (HIGH SENSITIVITY)  TROPONIN I (HIGH SENSITIVITY)    EKG EKG Interpretation  Date/Time:  Tuesday November 07 2020 21:04:30 EDT Ventricular Rate:  59 PR Interval:  212 QRS Duration: 92 QT Interval:  406 QTC Calculation: 401 R Axis:   -2 Text Interpretation: Unusual P axis, possible ectopic atrial bradycardia Low voltage QRS Cannot rule out Anterior infarct , age undetermined Abnormal ECG No significant change since last tracing Confirmed by Wandra Arthurs [phone]) on 11/07/2020 9:36:23 PM  Radiology CT HEAD WO CONTRAST (5MM)  Result Date: 11/07/2020 CLINICAL DATA:  Cerebral hemorrhage suspected.  Hypertension. EXAM: CT HEAD WITHOUT CONTRAST TECHNIQUE: Contiguous axial images were obtained from the base of  the skull through the vertex without intravenous contrast. COMPARISON:  07/26/2020 FINDINGS: Brain: No evidence of acute infarction, hemorrhage, hydrocephalus, extra-axial collection or mass lesion/mass effect. Vascular: No hyperdense vessel or unexpected calcification. Skull: Normal. Negative for fracture or  focal lesion. Sinuses/Orbits: No acute finding. Other: None. IMPRESSION: No acute intracranial abnormalities. Electronically Signed   By: Lucienne Capers M.D.   On: 11/07/2020 23:00    Procedures Procedures   Medications Ordered in ED Medications  hydrALAZINE (APRESOLINE) injection 10 mg (10 mg Intravenous Given 11/07/20 2206)  metoCLOPramide (REGLAN) injection 10 mg (10 mg Intravenous Given 11/07/20 2203)  diphenhydrAMINE (BENADRYL) injection 25 mg (25 mg Intravenous Given 11/07/20 2202)    ED Course  I have reviewed the triage vital signs and the nursing notes.  Pertinent labs & imaging results that were available during my care of the patient were reviewed by me and considered in my medical decision making (see chart for details).    MDM Rules/Calculators/A&P                          ADRIONA KANEY is a 75 y.o. female here presenting with dizziness and headache.  Patient was noted to be hypertensive over 200.  I think likely symptomatic hypertension versus head bleed. Will get CT head to rule out a bleed.  We will give hydralazine and check blood work to rule out end organ damage.   11:18 PM Labs unremarkable and CT head did not show any bleeding.  Blood pressure is down to 150s now.  She states that she has labile blood pressure and sometimes will go up to 180s and go back down to 130.  She is already taking losartan 100 mg daily.  I told her to take it as prescribed and keep a detailed blood pressure log and follow-up with PCP.  Final Clinical Impression(s) / ED Diagnoses Final diagnoses:  None    Rx / DC Orders ED Discharge Orders     None        Drenda Freeze, MD 11/07/20 2318

## 2020-11-08 DIAGNOSIS — R109 Unspecified abdominal pain: Secondary | ICD-10-CM | POA: Diagnosis not present

## 2020-11-08 DIAGNOSIS — K6389 Other specified diseases of intestine: Secondary | ICD-10-CM | POA: Diagnosis not present

## 2020-11-08 DIAGNOSIS — R142 Eructation: Secondary | ICD-10-CM | POA: Diagnosis not present

## 2020-11-08 DIAGNOSIS — R140 Abdominal distension (gaseous): Secondary | ICD-10-CM | POA: Diagnosis not present

## 2020-11-08 DIAGNOSIS — K5900 Constipation, unspecified: Secondary | ICD-10-CM | POA: Diagnosis not present

## 2020-11-08 DIAGNOSIS — R12 Heartburn: Secondary | ICD-10-CM | POA: Diagnosis not present

## 2020-11-09 ENCOUNTER — Ambulatory Visit (INDEPENDENT_AMBULATORY_CARE_PROVIDER_SITE_OTHER): Payer: Medicare Other

## 2020-11-09 ENCOUNTER — Ambulatory Visit: Payer: Medicare Other | Admitting: Pharmacist

## 2020-11-09 DIAGNOSIS — J309 Allergic rhinitis, unspecified: Secondary | ICD-10-CM | POA: Diagnosis not present

## 2020-11-09 DIAGNOSIS — G8929 Other chronic pain: Secondary | ICD-10-CM

## 2020-11-09 DIAGNOSIS — I25119 Atherosclerotic heart disease of native coronary artery with unspecified angina pectoris: Secondary | ICD-10-CM

## 2020-11-09 DIAGNOSIS — F411 Generalized anxiety disorder: Secondary | ICD-10-CM

## 2020-11-09 DIAGNOSIS — K219 Gastro-esophageal reflux disease without esophagitis: Secondary | ICD-10-CM

## 2020-11-09 DIAGNOSIS — I10 Essential (primary) hypertension: Secondary | ICD-10-CM

## 2020-11-09 DIAGNOSIS — R519 Headache, unspecified: Secondary | ICD-10-CM

## 2020-11-10 ENCOUNTER — Telehealth: Payer: Self-pay | Admitting: Pharmacist

## 2020-11-10 NOTE — Telephone Encounter (Signed)
Patient called today frustrated. She went to request refills for [name] and [name] and was surprised by the cost. She is in [employer] coverage gap and cost of Emgality was $300 and Ubrelvy $150.  Last month I had explained to patient that she would be in Medicare Coverage gap when she refilled again and sent applications for patient assistance as well as recommended she get in touch with neurology office to discuss options. I also tried to explain this to her yesterday during out follow up phone visit.   Stephanie Parks tried to call neurology office but they are closed today.  I was able to locate #2 samples of Ubrelvy in our office until she can call neurology office on Monday.

## 2020-11-12 NOTE — Progress Notes (Addendum)
[facility] [address] [place], [place] [address] [phone]  Date:  11/15/2020   Name:  Stephanie Parks   DOB:  October 07, 1945   MRN:  [id number]  PCP:  [name], MD    Chief Complaint: ER follow up (Seen at the ER last week for HTN- Neuro has added Nortriptyline. Pt c/o lightheadedness. /Flu shot today: declines)   History of Present Illness:  Stephanie Parks is a 75 y.o. very pleasant female patient who presents with the following:   Patient seen today for follow-up Most recent visit with myself on 9/14- hypertension, hyperlipidemia, allergic rhinitis, OSA on CPAP. She has tended to have frequent ER visits for chest pain likely related to anxiety She also had [id number] complicated by pulmonary embolism in January of this year.  She has completed treatment with Eliquis and is no longer using oxygen.  At her last visit she was taking 40 mill equivalents of potassium daily but still had mild hypokalemia.  She complained of continued shortness of breath at that time, I repeated a CT angiogram which was negative for pulmonary embolism  She then went to the emergency department on 9/20-at that time she had concern of headache, dizziness and hypertension.  She underwent a dilated eye exam earlier that day and was concerned that her vision was still altered She was evaluated with labs and a CT head, blood pressure noted to be mildly elevated Potassium was normal at 3.5 She was discharged home  Will again try to encourage patient to start antianxiety medication  However, she was actually seen by neurology this am and they started nortriptyline Stephanie Parks continues to have regular headaches with migrainous symptoms. Ajovy did seem to be effective but was not covered. She took one Emgality injection and called two weeks later to report it was not helping. Unsure if enough time was given to determine effectiveness,  however, medication is too expensive for her. Review of Epic shows that she was doing the best with previous neurologist and was taking nortriptyline 50mg  daily at bedtime. She has been inconsistent with taking this medication in the past. I have had a lengthy conversation with her, today, detailing the importance of taking medication consistency and expectations regarding length of time needed to determine if it is going to be effective. I will have her start nortriptyline 25mg  nightly for 1 week then increase dose to 50mg  every night. I have given her [name] 100mg  samples (5 tablets) and given her contact information for Ubrelvy. I have encouraged her to apply for patient assistance with [name]. She will continue Tylenol for abortive therapy. She is doing well on CPAP therapy. Compliance report shows excellent compliance. She was encouraged to continue nightly use for at least 4 hours. She does continue to follow Dr. Christella Noa for history of brain aneurysm. Last CT angio stable 10/2019. She was advised to follow closely with primary care as well for chronic comorbidities, specifically BP management. May be beneficial to increase metoprolol which can also help with migraines if PCP feels this is appropriate.  She will follow-up with me in 3-6 months, sooner if needed.  She verbalizes understanding and agreement with this plan.  Today Stephanie Parks expresses being very frustrated about her frequent trips to the ER and other doctors.  She relates that her frequent ER visits are causing financial distress.  She wants to know why she feels lightheaded when she wakes up  in the morning.  She wonders if this is due to high blood pressure.  She does feel that anxiety is likely playing a role in her symptoms  Of note, chest pain is not as much of a frequent issue recently.  She does have a cardiology appointment in November BP Readings from Last 3 Encounters:  11/15/20 (!) 152/80  11/15/20 (!) 151/73  11/07/20 (!) 164/60     Patient Active Problem List   Diagnosis Date Noted   Unilateral primary osteoarthritis, left hip 10/18/2020   Greater trochanteric bursitis of left hip 10/18/2020   Headache 06/07/2020   Acute respiratory failure with hypoxia ([place]) 05/02/2020   Post-COVID chronic dyspnea 05/02/2020   Pulsatile neck mass 04/26/2020   Pneumonia due to COVID-19 virus 03/12/2020   Headache disorder 01/04/2020   Seasonal allergic conjunctivitis 10/04/2019   Dysfunction of right eustachian tube 10/04/2019   Pre-diabetes 06/02/2019   Seasonal and perennial allergic rhinitis 12/22/2018   Heart palpitations 12/22/2018   History of chest pain 07/20/2018   Brain aneurysm 07/20/2018   Dyslipidemia 05/04/2018   Bronchitis 04/23/2018   Tendonitis, Achilles, right 12/08/2017   Onychomycosis 12/08/2017   Unilateral primary osteoarthritis, left knee 10/07/2017   Ingrown toenail 08/10/2017   Coughing 04/24/2017   CAD (coronary artery disease) 03/01/2015   Aneurysm ([place]) 03/01/2015   Dizziness 03/01/2015   Paresthesia of arm 03/01/2015   Seasonal allergic rhinitis 03/01/2015   Achalasia 09/28/2012   CN (constipation) 09/28/2012   Cephalalgia 08/24/2012   Essential hypertension 04/19/2011   Hyperlipidemia 04/19/2011   GERD (gastroesophageal reflux disease) 04/19/2011   Chest pain 04/04/2011    Past Medical History:  Diagnosis Date   3-vessel CAD 03/01/2015   Achalasia 09/28/2012   Allergic rhinitis 03/01/2015   Anemia    Aneurysm ([place]) 03/01/2015   BP (high blood pressure) 03/01/2015   Bronchitis 04/23/2018   Cephalalgia 08/24/2012   Overview:  ICD-10 cut over     Chest pain 04/04/2011   CN (constipation) 09/28/2012   Coughing 04/24/2017   Decreased potassium in the blood 03/01/2015   Diverticulosis    Dizziness 03/01/2015   GERD (gastroesophageal reflux disease)    Hiatal hernia    Hypercholesterolemia 03/01/2015   Hyperlipidemia    Hypertension    Ingrown toenail 08/10/2017   Inguinal hernia     Migraine headache    Onychomycosis 12/08/2017   Paresthesia of arm 03/01/2015   Schatzki's ring    Tendonitis, Achilles, right 12/08/2017   Unilateral primary osteoarthritis, left knee 10/07/2017    Past Surgical History:  Procedure Laterality Date   ABDOMINAL HYSTERECTOMY     BACK SURGERY     x 3   CARPAL TUNNEL RELEASE     left wrist   CERVICAL SPINE SURGERY     HEMORRHOID SURGERY      Social History   Tobacco Use   Smoking status: Former    Packs/day: 0.50    Years: 20.00    Pack years: 10.00    Types: Cigarettes    Quit date: 02/19/1984    Years since quitting: 36.7   Smokeless tobacco: Never  Vaping Use   Vaping Use: Never used  Substance Use Topics   Alcohol use: No   Drug use: No    Family History  Problem Relation Age of Onset   Allergic rhinitis Sister    Diabetes Sister    Hypertension Sister    Heart attack Father 73   Heart disease Father    Stomach  cancer Maternal Grandmother    Heart disease Mother    Colon cancer Neg Hx    Angioedema Neg Hx    Asthma Neg Hx    Eczema Neg Hx    Urticaria Neg Hx    Immunodeficiency Neg Hx    Breast cancer Neg Hx    Migraines Neg Hx    Headache Neg Hx     Allergies  Allergen Reactions   Shellfish Allergy Hives and Swelling   Ace Inhibitors Other (See Comments)    Pt cannot recall her reaction but tolerates arb    Gabapentin Other (See Comments)    headaches   Pitavastatin     Unknown reaction    Topiramate     Heart race,    Sulfa Antibiotics Other (See Comments) and Rash    Fine bumps    Medication list has been reviewed and updated.  Current Outpatient Medications on File Prior to Visit  Medication Sig Dispense Refill   acetaminophen (TYLENOL) 325 MG tablet Take 650 mg by mouth. For headaches     Calcium Carb-Cholecalciferol (CALCIUM 600 + D PO) Take 1 tablet by mouth daily.     ELDERBERRY PO Take 50 mg by mouth daily. Also contains zinc and vitamin C     EPINEPHrine (EPIPEN 2-PAK) 0.3 mg/0.3 mL  IJ SOAJ injection Inject 0.3 mLs (0.3 mg total) into the muscle as needed for anaphylaxis. Per allergen immunotherapy protocol 1 each 2   fluticasone (FLONASE) 50 MCG/ACT nasal spray USE 2 SPRAYS IN NOSTRIL(S) DAILY AS NEEDED.  In the right nostril point the applicator out toward your right ear.  In the left nostril point the applicator out toward her left ear (Patient taking differently: Place 2 sprays into both nostrils daily as needed for allergies.) 16 g 9   Galcanezumab-gnlm (EMGALITY) 120 MG/ML SOAJ Inject 120 mg into the skin every 30 (thirty) days. 1.12 mL 11   losartan (COZAAR) 100 MG tablet Take 1 tablet (100 mg total) by mouth daily. 90 tablet 1   METAMUCIL FIBER PO Take by mouth.     metoprolol succinate (TOPROL-XL) 25 MG 24 hr tablet Take 0.5 tablets (12.5 mg total) by mouth daily. 45 tablet 3   Multiple Vitamins-Minerals (MULTIVITAL) tablet Take 1 tablet by mouth daily.      omeprazole (PRILOSEC) 40 MG capsule Take 40 mg by mouth daily.     potassium chloride SA (KLOR-CON M20) 20 MEQ tablet TAKE 2 TABLETS BY MOUTH EVERY DAY 180 tablet 1   rosuvastatin (CRESTOR) 10 MG tablet TAKE 1 TABLET BY MOUTH EVERY DAY (Patient taking differently: Take 10 mg by mouth daily.) 90 tablet 3   Specialty Vitamins Products (MAGNESIUM, AMINO ACID CHELATE,) 133 MG tablet Take 1 tablet by mouth daily. Dose unknown     UBRELVY 100 MG TABS TAKE 1 TABLET BY MOUTH AS NEEDED. CAN TAKE 1 ADDITIONAL TABLET IN 2 HOURS IF NEEDED AFTER INITIAL DOSE. NO MORE THAN 2 TABLETS IN 24 HOURS. 10 tablet 5   No current facility-administered medications on file prior to visit.    Review of Systems:  As per HPI- otherwise negative.   Physical Examination: Vitals:   11/15/20 1000  BP: (!) 152/80  Pulse: 68  Resp: 18  Temp: 98.2 F (36.8 C)  SpO2: 100%   Vitals:   11/15/20 1000  Weight: 196 lb 9.6 oz (89.2 kg)  Height: 5\' 6"  (1.676 m)   Body mass index is 31.73 kg/m. Ideal Body Weight: Weight in (  lb) to have  BMI = 25: 154.6  GEN: no acute distress. Overweight, looks well  HEENT: Atraumatic, Normocephalic.  Ears and Nose: No external deformity. CV: RRR, No M/G/R. No JVD. No thrill. No extra heart sounds. PULM: CTA B, no wheezes, crackles, rhonchi. No retractions. No resp. distress. No accessory muscle use. ABD: S, NT, ND, +BS. No rebound. No HSM. EXTR: No c/c/e PSYCH: Normally interactive. Conversant.    Assessment and Plan: Hospital discharge follow-up  Essential hypertension  Hyperlipidemia, unspecified hyperlipidemia type  Palpitation - Plan: metoprolol succinate (TOPROL-XL) 25 MG 24 hr tablet  Patient seen today after ER visit.  She went to the ER she felt lightheaded, noted that her vision seemed abnormal (dilated eye exam earlier that day) concern.  She was evaluated and released to home  We discussed her situation in detail today.  We discussed the fact that checking her blood pressure tends to cause a lot of anxiety.  I counseled her that high blood pressure is unlikely to cause her to feel lightheaded-I am not certain what is causing her lightheadedness but I do not think it is hypertension.  Her blood pressures have been moderately elevated over her last few visits, we will try increasing her metoprolol from 12.5 to 25mg .  We will need to advance her treatment cautiously due to lower diastolic numbers  I asked her to give it about a week and then check her blood pressure and pulse at home.  I encouraged her to please contact me with her readings and not panic if her reading is high, I will get back to her promptly and we can make a plan.  Also, we are hopeful that nortriptyline will help with her headaches and also may be some anxiety  This visit occurred during the SARS-CoV-2 public health emergency.  Safety protocols were in place, including screening questions prior to the visit, additional usage of staff PPE, and extensive cleaning of exam room while observing appropriate contact  time as indicated for disinfecting solutions.   Signed Lamar Blinks, MD

## 2020-11-12 NOTE — Patient Instructions (Signed)
Visit Information  PATIENT GOALS:  Goals Addressed             This Visit's Progress    Pharmcy Chronic Care Plan   On track    Current Barriers:  Unable to independently afford treatment regimen Unable to achieve control of migraine headaches or HTN   Pharmacist Clinical Goal(s):  Over the next 90 days, patient will verbalize ability to afford treatment regimen achieve adherence to monitoring guidelines and medication adherence to achieve therapeutic efficacy achieve control of migraine headaches as evidenced by decreasing frequency of migraine headaches maintain control of HTN as evidenced by BP <140/90  through collaboration with PharmD and provider.   Interventions: 1:1 collaboration with Copland, Gay Filler, MD regarding development and update of comprehensive plan of care as evidenced by provider attestation and co-signature Inter-disciplinary care team collaboration (see longitudinal plan of care) Comprehensive medication review performed; medication list updated in electronic medical record  Migraine Headaches:  Goal: decrease frequency of headaches to less than 15 days per month Managed by neurology - Amy Lomax Current Therapy:  Emgality - received first injection 09/17/2020 Ubrelvy 100mg  - take 1 tablet as needed for headache. May take an additional tablet if needed in 2 hours. No more than 2 tablets in 24 hours During our phone visit patient had call from neurology office. Neuro started Medrol dose pack.  Interventions:  Reviewed patient's formulary / called insurance - [facility] [place], [place], Nurtec and [place] are all non formulary (but called insurance and could ask for coverage determination if she has tried but failed other formulary medications) Emgality and Aimovig are tier 4 - $100 / 30 days Checked with her pharmacy. They did a test claim and next Emgality will cost $213.59 - patient will enter Medicare coverage with next refill.  If patient is to  stay on Emgality, could apply for patient assistance through the [employer]. Also assistance available for Nurtec.  Sent patient information about above therapies after last appointment in early August 2022.   Explained that it might take 3 to 4 months to see the full effects of Emgality Reminded to complete patient assistance applications  Hypertension: Uncontrolled per last 3 office BP readings; Possibly related to pain / headaches.  BP goal <140/90  BP Readings from Last 3 Encounters:  11/07/20 (!) 164/60  11/01/20 (!) 142/72  10/03/20 (!) 150/60   Current treatment: Losartan 100mg  daily Metoprolol succinate 25mg  ER - take 0.5 tablets daily (also helps with palpitations)  Interventions:  Discussed blood pressure goals Continue to check blood pressure and heart rate daily, record and bring to future appointments  Hyperlipidemia / CAD: No recent LDL but patient is taking moderate to high intensity statin. Current treatment: rosuvastatin 10mg  daily Interventions:  Continue current regimen Consider checking lipid panel with next labs  Chronic constipation:  Current treatment:  Linzess 93mcg daily Previous therapies: Amitiza 35mcg - patient could not remember why stopped Interventions:  [place], both Linzess and generic Amitiza are tier 3 or $47/month. Patient will enter Medicare coverage gap soon.  Mailed application for Linzess patient assistance.   Osteopenia / low bone density:  Current Therapy:  Calcium + D - 500mg  daily Interventions:  Continue to take calcium + D daily - goal is to get 1200mg  of calcium daily from supplement and diet Vitamin D goal is to get 1000 IU daily Fall prevention Consider rechecking DEXA  GERD / acid reflux:  Goal: Decrease symptoms of acid reflux  Current therapy:  Omeprazole 40mg  daily Interventions:  Recommended if she has breakthrough symptoms she can try OTC Pepcid up to twice a day Monitor for  symptoms of acid reflux and report to office if experience these symptoms:  A burning sensation in your chest (heartburn), usually after eating, which might be worse at night. Chest pain. Difficulty swallowing. Regurgitation of food or sour liquid. Sensation of a lump in your throat. Continue as planned to f/u with Dr Oren Beckmann / GI in September 2022.  Patient Goals/Self-Care Activities Over the next 90 days, patient will:  take medications as prescribed,  Check blood pressure daily, document, and provide at future appointments,  Collaborate with provider on medication access solutions, and  Work with neurologist regarding migraine headaches   Follow Up Plan: Telephone follow up appointment with care management team member scheduled for:  2 - 4 weeks          The patient verbalized understanding of instructions, educational materials, and care plan provided today and declined offer to receive copy of patient instructions, educational materials, and care plan.   Telephone follow up appointment with care management team member scheduled for:  Cherre Robins, PharmD Clinical Pharmacist [facility]

## 2020-11-12 NOTE — Chronic Care Management (AMB) (Signed)
Chronic Care Management Pharmacy Note  11/12/2020 Name:  Stephanie Parks MRN:  [id number] DOB:  [birth year]   Subjective: Stephanie Parks is an 75 y.o. year old female who is a primary patient of Copland, Gay Filler, MD.  The CCM team was consulted for assistance with disease management and care coordination needs.    Engaged with patient by telephone for follow up visit in response to provider referral for pharmacy case management and/or care coordination services.   Consent to Services:  The patient was given information about Chronic Care Management services, agreed to services, and gave verbal consent prior to initiation of services.  Please see initial visit note for detailed documentation.   Patient Care Team: Copland, Gay Filler, MD as PCP - General (Family Medicine) Jettie Booze, MD as PCP - Cardiology (Cardiology) Ashok Pall, MD as Consulting Physician (Neurosurgery) Luretha Rued, RN as Case Manager Cherre Robins, PharmD (Pharmacist)  Recent office visits: 11/01/2020 - PCP (Dr Lorelei Pont) F/U fluctuating BP, palpitations and chest discomfort. Felt that chest pain was musculoskeletal but checked EKG, D-Dimer, troponin and BMP. D-DImer was still high - ordered CT angiogram which was negative for blood clot / PE. Noted to have low potassium despite taking 35mq daily 10/03/2020 - PCP (Dr LEtter Sjogren Patient presented for acute visit with CP. [facility] but patient presented to PCP office instead. Noted CP has recolved. Labs checked. Changed omeprazole to Dexilant 626mdaily. Add pepcid.  08/31/2020 - PCP (Dr CoLorelei PontSeen for anxiety and right shoulder pain; ordered mammogram; referred to ortho for shoulder pain  Recent consult visits: 11/02/2020 - Ortho Surgery (Dr WhDurward FortesReviwed Left hip MRI with patient. Referred to Dr BlNinfa Lindeno discuss left hip replacement 10/24/2020 - Ortho Surger - Phone call. Patient  requesting stonger pain medication. Recommended MRI and possible hip replacement.  10/18/2020 - Orthopedic surgery (Dr WhDurward FortesSeen for OA and pain of left hip. Received injection in left hip of lidocaine, methyprednisolone and bupivacaine.  10/05/2020 - [facility]. Recieved immunotherpay  10/05/2020 - GI (Dr HoOren BeckmannPhone Message. Patient requested PPI sent to pharmacy . Omeprazole was sent. 09/27/2020 - Ortho (Dr XuErlinda HongLeft hip steriod injection. 09/15/2020 - GI Phone Call - continued bloating and swallowing difficulty. Dr HoOren Beckmannoted nothing significant to explain symptoms on EGD and manometry from 09/01/20. Recommended H.Pylori Breath test. Patient agreed with proceeding with HBT. 09/07/2020 - Immunotherapy with allergiest office 09/01/2020 - GI - esophageal manometry performed  Hospital visits: [facility]07/2020 [place] [facility] Seen for headache, dizziness and HTN. SBP at home was 160. BP in ED was 159/82 and HR 102. EKG and CT head ordered. GIven hydralazine in ED. Lab and head CT unremarkable. BP decreased. No med changes noted. [facility]03[facility] - showed bradycardia but otherwise normal. Suspected reflux. Sent to ED since patient wanted troponin checked. However instead patient made appt with PCP office.   Objective:  Lab Results  Component Value Date   CREATININE 0.86 11/07/2020   CREATININE 0.82 11/01/2020   CREATININE 0.91 10/03/2020    Lab Results  Component Value Date   HGBA1C 5.9 06/02/2019   Last diabetic Eye exam: No results found for: HMDIABEYEEXA  Last diabetic Foot exam: No results found for: HMDIABFOOTEX      Component Value Date/Time   CHOL 161 06/02/2019 1439  CHOL 176 07/28/2018 0756   TRIG 98 03/12/2020 1503   HDL 56.00 06/02/2019 1439   HDL 73 07/28/2018 0756   CHOLHDL 3 06/02/2019 1439   VLDL 17.2 06/02/2019 1439   LDLCALC 87 06/02/2019 1439   LDLCALC 93 07/28/2018 0756     Hepatic Function Latest Ref Rng & Units 11/07/2020 10/03/2020 07/24/2020  Total Protein 6.5 - 8.1 g/dL 6.9 6.8 6.6  Albumin 3.5 - 5.0 g/dL 3.4(L) 3.7 3.3(L)  AST 15 - 41 U/L _0 ALT 0 - 44 U/L _1 Alk Phosphatase 38 - 126 U/L 63 70 59  Total Bilirubin 0.3 - 1.2 mg/dL 0.3 0.3 0.4  Bilirubin, Direct 0.00 - 0.40 mg/dL - - -    Lab Results  Component Value Date/Time   TSH 3.80 03/08/2020 04:31 PM   TSH 2.64 11/10/2019 02:36 PM    CBC Latest Ref Rng & Units 11/07/2020 10/03/2020 07/24/2020  WBC 4.0 - 10.5 K/uL 6.6 10.4 -  Hemoglobin 12.0 - 15.0 g/dL 10.9(L) 11.1(L) 11.6(L)  Hematocrit 36.0 - 46.0 % 33.3(L) 33.9(L) 34.0(L)  Platelets 150 - 400 K/uL 250 326.0 -    Lab Results  Component Value Date/Time   VD25OH 36.70 09/26/2016 10:59 AM    Clinical ASCVD: Yes  The ASCVD Risk score (Arnett DK, et al., 2019) failed to calculate for the following reasons:   The patient has a prior MI or stroke diagnosis     Social History   Tobacco Use  Smoking Status Former   Packs/day: 0.50   Years: 20.00   Pack years: 10.00   Types: Cigarettes   Quit date: 02/19/1984   Years since quitting: 36.7  Smokeless Tobacco Never   BP Readings from Last 3 Encounters:  11/07/20 (!) 164/60  11/01/20 (!) 142/72  10/03/20 (!) 150/60   Pulse Readings from Last 3 Encounters:  11/07/20 (!) 59  11/01/20 66  10/03/20 72   Wt Readings from Last 3 Encounters:  11/07/20 192 lb 14.4 oz (87.5 kg)  11/01/20 193 lb (87.5 kg)  10/18/20 196 lb (88.9 kg)    Assessment: Review of patient past medical history, allergies, medications, health status, including review of consultants reports, laboratory and other test data, was performed as part of comprehensive evaluation and provision of chronic care management services.   SDOH:  (Social Determinants of Health) assessments and interventions performed:     CCM Care Plan  Allergies  Allergen Reactions   Shellfish Allergy Hives and Swelling   Ace  Inhibitors Other (See Comments)    Pt cannot recall her reaction but tolerates arb    Gabapentin Other (See Comments)    headaches   Pitavastatin     Unknown reaction    Topiramate     Heart race,    Sulfa Antibiotics Other (See Comments) and Rash    Fine bumps    Medications Reviewed Today     Reviewed by Cherre Robins, PharmD (Pharmacist) on 11/12/20 at 2142  Med List Status: <None>   Medication Order Taking? Sig Documenting Provider Last Dose Status Informant  acetaminophen (TYLENOL) 325 MG tablet [id number] Yes Take 650 mg by mouth. For headaches [provider] Taking Active Self  Calcium Carb-Cholecalciferol (CALCIUM 600 + D PO) [id number] Yes Take 1 tablet by mouth daily. [provider] Taking Active Self  ELDERBERRY PO [id number] Yes Take 50 mg by mouth daily. Also contains zinc and vitamin C [provider] Taking Active Self  EPINEPHrine (EPIPEN 2-PAK)  0.3 mg/0.3 mL IJ SOAJ injection [id number] Yes Inject 0.3 mLs (0.3 mg total) into the muscle as needed for anaphylaxis. Per allergen immunotherapy protocol Ambs, Kathrine Cords, FNP Taking Active Self           Med Note Vita Barley Mar 13, 2020 12:35 PM)    fluticasone (FLONASE) 50 MCG/ACT nasal spray [id number] Yes USE 2 SPRAYS IN NOSTRIL(S) DAILY AS NEEDED.  In the right nostril point the applicator out toward your right ear.  In the left nostril point the applicator out toward her left ear  Patient taking differently: Place 2 sprays into both nostrils daily as needed for allergies.   Dara Hoyer, FNP Taking Active            Med Note Vita Barley Mar 13, 2020 12:32 PM)    Donah Driver [facility]) 120 MG/ML Darden Palmer [id number] Yes Inject 120 mg into the skin every 30 (thirty) days. Melvenia Beam, MD Taking Active   losartan (COZAAR) 100 MG tablet [id number] Yes Take 1 tablet (100 mg total) by mouth daily. Jettie Booze, MD Taking Active   METAMUCIL FIBER PO [id number] Yes Take by  mouth. [provider] Taking Active            Med Note Juleen China, Jodi Marble Sep 14, 2020  2:47 PM) Reports takes every other day (powder form)  metoprolol succinate (TOPROL-XL) 25 MG 24 hr tablet [id number] Yes Take 0.5 tablets (12.5 mg total) by mouth daily. Copland, Gay Filler, MD Taking Active   Multiple Vitamins-Minerals (MULTIVITAL) tablet [id number] Yes Take 1 tablet by mouth daily.  [provider] Taking Active Self           Med Note Pollyann Kennedy Aug 17, 2020 11:19 AM) Alive vit  omeprazole (PRILOSEC) 40 MG capsule [id number] Yes Take 40 mg by mouth daily. [provider] Taking Active   potassium chloride SA (KLOR-CON M20) 20 MEQ tablet [id number] Yes TAKE 2 TABLETS BY MOUTH EVERY DAY Copland, Gay Filler, MD Taking Active   rosuvastatin (CRESTOR) 10 MG tablet [id number] Yes TAKE 1 TABLET BY MOUTH EVERY DAY  Patient taking differently: Take 10 mg by mouth daily.   Copland, Gay Filler, MD Taking Active            Med Note Vita Barley Mar 13, 2020 12:35 PM)    Specialty Vitamins Products (MAGNESIUM, AMINO ACID CHELATE,) 133 MG tablet [id number] Yes Take 1 tablet by mouth daily. Dose unknown [provider] Taking Active   UBRELVY 100 MG TABS [id number] Yes TAKE 1 TABLET BY MOUTH AS NEEDED. CAN TAKE 1 ADDITIONAL TABLET IN 2 HOURS IF NEEDED AFTER INITIAL DOSE. NO MORE THAN 2 TABLETS IN 24 HOURS. Melvenia Beam, MD Taking Active   Med List Note Lorelei Pont, Gay Filler, MD 04/06/20 1050):              Patient Active Problem List   Diagnosis Date Noted   Unilateral primary osteoarthritis, left hip 10/18/2020   Greater trochanteric bursitis of left hip 10/18/2020   Headache 06/07/2020   Acute respiratory failure with hypoxia ([place]) 05/02/2020   Post-COVID chronic dyspnea 05/02/2020   Pulsatile neck mass 04/26/2020   Pneumonia due to COVID-19 virus 03/12/2020   Headache disorder 01/04/2020   Seasonal allergic conjunctivitis  10/04/2019   Dysfunction of right eustachian tube 10/04/2019   Pre-diabetes 06/02/2019   Seasonal and  perennial allergic rhinitis 12/22/2018   Heart palpitations 12/22/2018   History of chest pain 07/20/2018   Brain aneurysm 07/20/2018   Dyslipidemia 05/04/2018   Bronchitis 04/23/2018   Tendonitis, Achilles, right 12/08/2017   Onychomycosis 12/08/2017   Unilateral primary osteoarthritis, left knee 10/07/2017   Ingrown toenail 08/10/2017   Coughing 04/24/2017   CAD (coronary artery disease) 03/01/2015   Aneurysm ([place]) 03/01/2015   Dizziness 03/01/2015   Paresthesia of arm 03/01/2015   Seasonal allergic rhinitis 03/01/2015   Achalasia 09/28/2012   CN (constipation) 09/28/2012   Cephalalgia 08/24/2012   Essential hypertension 04/19/2011   Hyperlipidemia 04/19/2011   GERD (gastroesophageal reflux disease) 04/19/2011   Chest pain 04/04/2011    Immunization History  Administered Date(s) Administered   PFIZER(Purple Top)SARS-COV-2 Vaccination 06/07/2019, 06/30/2019    Conditions to be addressed/monitored: CAD, HTN, HLD, Anxiety, and migraine headache; achalasia; GERD; OA; pre DM  Care Plan : General Pharmacy (Adult)  Updates made by Cherre Robins, PHARMD since 11/12/2020 12:00 AM     Problem: Chronic Headaches, Aneurysm; CAD; HDL; HTN; bronchitis; achalasia; GERD; anxiety; chronic constipation; OA   Priority: High  Onset Date: 09/28/2020     Long-Range Goal: Medication and chronic conditions management   Start Date: 09/28/2020  Priority: High  Note:   Current Barriers:  Unable to independently afford treatment regimen Unable to achieve control of migraine headaches or HTN   Pharmacist Clinical Goal(s):  Over the next 90 days, patient will verbalize ability to afford treatment regimen achieve adherence to monitoring guidelines and medication adherence to achieve therapeutic efficacy achieve control of migraine headaches as evidenced by decreasing frequency of migraine  headaches maintain control of HTN as evidenced by BP <140/90  through collaboration with PharmD and provider.   Interventions: 1:1 collaboration with Copland, Gay Filler, MD regarding development and update of comprehensive plan of care as evidenced by provider attestation and co-signature Inter-disciplinary care team collaboration (see longitudinal plan of care) Comprehensive medication review performed; medication list updated in electronic medical record  Migraine Headaches:  Uncontrolled but patient reports that she thinks headaches frequency has improved a little since starting Emgality Having about 4 to 5 headaches per week Goal: decrease frequency of headaches to less than 15 days per month Managed by neurology - Amy Lomax Current Therapy:  Emgality - received first injection 09/17/2020 Ubrelvy 189m - take 1 tablet as needed for headache. May take an additional tablet if needed in 2 hours. No more than 2 tablets in 24 hours Previous therapies: topiramate, nortriptyline, gabapentin, Ajovy (08/17/2020 - no covered by insurance so [facility]  Patient reports she has migraines several years ago but had been rare until May 2022. She feels like return of migraine headaches is linked to COVID infection from 03/2020 She has not seen any improvement in headache frequency since starting Emgality.  She is also concerned with cost of Emgality - $100 per month Ubrelvy helps for a few hours but headache returns quickly and she is unable to take every day. At last visit reviewed patient's formulary / [facility] Ajovy, Vyepti, Nurtec and QLenoria Chimeare all non formulary (but called insurance and could ask for coverage determination if she has tried but failed other formulary medications Emgality and Aimovig are tier 4 - $100 / 30 days I did review other possible ways to help with cost of these medications [place]  [place]. If patient is to stay on Emgality, could apply for patient assistance through the [employer]. Also is assistance available for Nurtec. Application has been sent but patient reports she has not completed Interventions:  Discussed patient assistance applications. Patient received but has not completed yet.  Reminded that it might take 3 to 4 months to see the full effects of Emgality Continue to follow up with neurology  Hypertension: Uncontrolled per last 3 office BP readings; Possibly related to pain / headaches.  BP goal <140/90  BP Readings from Last 3 Encounters:  11/07/20 (!) 164/60  11/01/20 (!) 142/72  10/03/20 (!) 150/60  Current treatment: Losartan 110m daily Metoprolol succinate 260mER - take 0.5 tablets daily (also helps with palpitations)  Current home readings: 143/65; 155/75; 129/68 HR: 61, 70 72 Believe pain from hip and headaches could have effect on her variable blood pressure  Interventions:  Discussed blood pressure goals Continue to check blood pressure and heart rate daily, record and bring to future appointments Appointment made with PCP for follow up (patient seen in ED for blood pressure)  Hyperlipidemia / CAD: No recent LDL but patient is on moderate to high intensity statin with CAD.  Current treatment: Rosuvastatin 1083maily Interventions:  Continue current regimen Consider checking lipid panel with next labs  Chronic constipation:  Controlled Current treatment:  Linzess 33m38maily Previous therapies: Amitiza 8mcg32mpatient could not remember why stopped Interventions:  [place] Linzess and generic Amitiza are tier 3 or $47/month. Patient will enter Medicare coverage gap soon.  Mailed application for Linzess patient assistance at last visit but patient has not completed yet.  Osteopenia:  Last DEXA 09/12/2016 T-Score for Left  femur nek = -1.2 T-Score for AP Spine = + 1.3 FRAX = 3.9% for osteoporotic fracture / 0.5% hip fracture Current Therapy:  Calcium + D - 500mg 11my Previous therapy: alendronate Interventions:  Continue to take calcium + D daily - goal is to get 1200mg o21mlcium daily from supplement and diet Vitamin D goal is to get 1000 IU daily Fall prevention Consider recheck DEXA  GERD:  Improved Patient was seen in ED and then in our office 10/03/2020 for chest pain that was thought to be related to acid reflux H.Pylori was neg H/H showed stable anemia Current therapy:  Omeprazole 40mg da67mDr Lowne reEtter Sjogrennded she change omeprazole to Dexilant and add Pepcid up to bid as needed.  Patient never tried either Dexilant or Pepcid. She states her pharmacy told her she should not take both omeprazole and Dexilant.  Denied reflux symptoms or chest pain since 10/03/2020 Interventions: Explained that she was to try Dexilant in place of omeprazole since she was having reflux symptoms. Patient reports she would like to stick with omeprazole. Med list updated.  Recommended if she has breakthrough symptoms she can try OTC Pepcid up to bid  Continue as planned to f/u with Dr Honor / Oren Beckmann09/27/2022  Patient Goals/Self-Care Activities Over the next 90 days, patient will:  take medications as prescribed, check blood pressure daily, document, and provide at future appointments, collaborate with provider on medication access solutions, and work with neurologist regarding migraine headaches   Follow Up Plan: Telephone follow up appointment with care management team member scheduled for:  1 month          Medication Assistance: Application for Emgality and Linzess  medication assistance program. in process.  Anticipated assistance start date 10/19/2020.  See plan  of care for additional detail. Applications were mailed at beginning of August. Patient endorses that she has received but has not completed PAP  applications yet.   Patient's preferred pharmacy is:  [id number] [place] [id number]: [phone]: [phone]  Follow Up:  Patient agrees to Care Plan and Follow-up.  Plan: Telephone follow up appointment with care management team member scheduled for:  2 to 4 weeks  TCherre Robins PharmD Clinical Pharmacist [facility]

## 2020-11-13 ENCOUNTER — Telehealth: Payer: Self-pay

## 2020-11-13 ENCOUNTER — Telehealth: Payer: Self-pay | Admitting: Family Medicine

## 2020-11-13 NOTE — Telephone Encounter (Signed)
I would also recommend that she contact her eye doctor to update them. Thank you

## 2020-11-13 NOTE — Telephone Encounter (Signed)
Pt has called to discuss the injection med and [name] have gone up in cost.  Pt unable to afford either (injection $170, Ubrelvy$330) pt is asking to have something called in for the terrible migraines she is having.  Please call

## 2020-11-13 NOTE — Telephone Encounter (Signed)
LVM advising pt we would need to get her in for an appt to discuss different options for her migraines. Offered 11/15/20 w/ AL,NP at 830am. Appt placed on hold. Asked pt to call back.

## 2020-11-13 NOTE — Telephone Encounter (Signed)
Came to get allergy injection Thursday but also had a test done at the eye doctor as well Thursday that put 3 different drops on her arm to see if she had any allergic reactions to them:  Prolensa  Prednesolone  Phenylephrine  She didn't show any reactions on her arm but she's saying her chest is itching really bad while circles on her chest.  I recommended her to get over the counter hydrocortisone cream and if that doesn't give her any relief to call our office back.  [phone]

## 2020-11-14 NOTE — Progress Notes (Signed)
PATIENT: Stephanie Parks DOB: [birth year]  REASON FOR VISIT: follow up HISTORY FROM: patient  Chief Complaint  Patient presents with   Follow-up    Pt alone, rm 10 she is struggling with her headaches and unable to afford the medication that she is on prices continue to go up. Still using CPAP, DME. [facility]    Stephanie Parks: headaches Stephanie Parks: sleep  HISTORY OF PRESENT ILLNESS: 11/15/2020 ALL: Yazleen returns for worsening headaches. We started her on Ajovy at last visit in 07/2020. She had first injection in the office. Insurance would not cover and she was switched to [employer] with first injection around 09/16/2020. She called 09/28/2020 reporting that Emgality was not helping and migraines were worsening. [name] was very expensive and not effective. Steroid taper was called in at that time. She called 9/26 reporting cost of Emgality and [name] was too expensive and wished to schedule follow up for change in therapy. She feels that [place] were effective.   She was seen in the ER 11/07/2020 for dizziness, headache and hypertension. She had reported a sudden onset of intractable headache following a dilated eye exam that day. BP was > 413 systolic. She was treated with hydralazine. Follow up BP 159/82. Patient reportedly taking losartan 100mg  daily and metoprolol ER 25mg  (taking 1/2 tablet-12.5mg ). CT was negative. Blood work unremarkable. She was advised to keep BP log and follow up with PCP. She has a BP log with her today showing readings from 244'W-102'V systolic. She has an appt with her PCP today to discuss.   I have reviewed notes from previous neurologist. She seemed to do the best on nortriptyline. She did try this medication for about 2-3 weeks earlier this year but did not feel it was effective. She admits that anxiety could be contributing to symptoms. She has been hesitant to try sumatriptan due to potential side effects.   Patient has tried and  failed: Preventative: Ajovy (not covered by insurance), Emgality (ineffective and too expensive), Topiramate (palpitations), metoprolol (on now), gabapentin (caused headaches), nortriptyline (ineffective, history of constipation and dry mouth)  Abortive: [place], Tylenol   08/17/2020 ALL:  Stephanie Parks returns for follow up for migraines. I last saw her 10/2018 and started topiramate 25mg  BID. She was seen by Dr Rexene Alberts 11/2018 and diagnosed with sleep apnea. CPAP was started. She was seen by [facility] in follow up for OSA on CPAP and doing well. She reported improvement in sleep and decreased palpitations. Uncertain if CPAP was helpful in migraine management. She continues CPAP nightly. She denies difficulty with machine or supplies but admits that she has not changed her mask recently.   She was diagnosed with Covid in 02/2020. She was hospitalized for about 6 days. She was doing well from a headache perspective until around April. Since, she has had trouble with bilateral pressure of both temples. She is taking Tyelnol 1300mg  twice daily that She was seen by Dr Hassell Done, ENT, 06/28/2020 for concerns of vestibular migraines. He started her on nortriptyline. She took nortriptyline for about 3 weeks and states it did not help. She has taken gabapentin in the past that made headaches worse. She could not tolerate topiramate started in 10/2018 due to palpitations. She has trouble with dry mouth and constipation.      11/16/2018 ALL:  Stephanie Parks is a 75 y.o. female here today for follow up of migraines. MRI unremarkable in 09/2018. She continues to have left sided tension type pain nearly everyday. It  is pounding at times. She has light and sound sensitivity. Pain radiates to the back of her head and into her neck. She can have headache with or without neck pain. She has changed her pillow recently that has helped with neck pain. MR cervical spine in 07/2017 showed stenosis but no cord injury. She was seen in ER  last week for elevated BP and leg swelling. She continues to check BP at home and reports readings are 140-150/60-70. She reports following up with PCP closely for this. She recently started on losartan. Her potassium was 3.3 at ER last week. She was also started on sertraline 25mg . She does feel that stress is making her headaches worse. She did not take sumatriptan due to concerns of having a reaction. She was given Tylenol # but reports that she felt dizzy and did not like how she felt. She takes Tylenol Arthritis 1-2 tablets maybe twice daily.   She works at [employer] with varying schedules. She gets about 5 hours of sleep at night. She lives alone. She has been told that she snores. She has woken at night from time to time coughing. She wakes twice nightly to urinate. She does not feel rested when she wakes. She does have morning headaches. Her sister has sleep apnea.    HISTORY: (copied from Dr Cathren Laine note on 09/02/2018)  HPI:  Stephanie Parks is a 75 y.o. female here as requested by Copland, Gay Filler, MD for headaches.  Past medical history of stable right-sided anterior aneurysm, hypertension, bronchitis, chest pain, coughing, dizziness, hypercholesterolemia, hyperlipidemia, migraines.  She is a former smoker half pack a day for 20 years, 10 pack years.  Per emergency room notes, she was seen May 20, she has been frequenting multiple emergency rooms for evaluation but despite testing no specific diagnosis has been given to her.  In the emergency room in May she reported that the symptoms have been ongoing for months if not more, she gets pressure in her head and reports she feels like her left arm gets numb intermittently.  But no weakness or dropping things in the left. She went to the eye doctor yesterday and everything is fine, nothing unusual. She has neck tightness on the right and pain in her neck.    Headaches started 2-3 months. This is different than previous headaches. She feels pressure  or like something is running in her head however the sensations of running on her head have stopped. She say Dr. Christella Noa a month ago and everthing is stable. She has pressure on the left side in the temple area. Sometimes in the right temple as well. Also the back of there neck. She has had cardiac evals, CT Angio of the brain, not an MRI. The headaches last 30 minutes to an hour. The last one was 3 days ago. 2-3x a week. She does not wake up with them. The light bothers her, a little lightheaded, nausea, sound sensitivity, can be pounding, 2 tylenols help. She had an emg/ncs last year and it was unremarkable. She has episodic blurry vision with the headaches, painful can be very painful, positional worse with standing she has to lay down.    Reviewed notes, labs and imaging from outside physicians, which showed:   I reviewed emergency room notes.  Patient was in the emergency room Jul 16, 2018.  She reported pressure in her head that feels like water running down her head with left arm weakness on and off since February.  She  has been seen numerous times with no diagnosis. She has been having headaches for months.  She has been going to multiple emergency rooms for evaluation but despite testing no specific diagnosis has been given to her.  She gets brief pressure sensations around her temple on the left.  The last for several seconds at a time and come and go.  She also has some sensation of something running down her head.  No visual symptoms.  No double vision no vision loss.  No nausea or vomiting.  She also feels like her left arm gets numb intermittently this comes and goes.  She does not find that she is dropping things or weak to function.  Both of these symptoms have been several months in duration or more.  On exam she had left temple tenderness however left arm was normal and the rest of her neurologic exam and physical exam were also normal.   She was also seen by my colleague Dr. Jannifer Franklin for leg  discomfort in the past.  EMG nerve conduction study was unremarkable.  She was seen for leg pain that is intermittent in nature and may hit one leg or the other lasting only a few seconds and going away.   REVIEW OF SYSTEMS: Out of a complete 14 system review of symptoms, the patient complains only of the following symptoms, dizziness, headache, numbness, weakness and all other reviewed systems are negative.  Epworth sleepiness scale: 8 Fatigue severity scale: 19  ALLERGIES: Allergies  Allergen Reactions   Shellfish Allergy Hives and Swelling   Ace Inhibitors Other (See Comments)    Pt cannot recall her reaction but tolerates arb    Gabapentin Other (See Comments)    headaches   Pitavastatin     Unknown reaction    Topiramate     Heart race,    Sulfa Antibiotics Other (See Comments) and Rash    Fine bumps    HOME MEDICATIONS: Outpatient Medications Prior to Visit  Medication Sig Dispense Refill   acetaminophen (TYLENOL) 325 MG tablet Take 650 mg by mouth. For headaches     Calcium Carb-Cholecalciferol (CALCIUM 600 + D PO) Take 1 tablet by mouth daily.     ELDERBERRY PO Take 50 mg by mouth daily. Also contains zinc and vitamin C     EPINEPHrine (EPIPEN 2-PAK) 0.3 mg/0.3 mL IJ SOAJ injection Inject 0.3 mLs (0.3 mg total) into the muscle as needed for anaphylaxis. Per allergen immunotherapy protocol 1 each 2   fluticasone (FLONASE) 50 MCG/ACT nasal spray USE 2 SPRAYS IN NOSTRIL(S) DAILY AS NEEDED.  In the right nostril point the applicator out toward your right ear.  In the left nostril point the applicator out toward her left ear (Patient taking differently: Place 2 sprays into both nostrils daily as needed for allergies.) 16 g 9   Galcanezumab-gnlm (EMGALITY) 120 MG/ML SOAJ Inject 120 mg into the skin every 30 (thirty) days. 1.12 mL 11   losartan (COZAAR) 100 MG tablet Take 1 tablet (100 mg total) by mouth daily. 90 tablet 1   METAMUCIL FIBER PO Take by mouth.     metoprolol  succinate (TOPROL-XL) 25 MG 24 hr tablet Take 0.5 tablets (12.5 mg total) by mouth daily. 45 tablet 3   Multiple Vitamins-Minerals (MULTIVITAL) tablet Take 1 tablet by mouth daily.      omeprazole (PRILOSEC) 40 MG capsule Take 40 mg by mouth daily.     potassium chloride SA (KLOR-CON M20) 20 MEQ tablet TAKE 2 TABLETS  BY MOUTH EVERY DAY 180 tablet 1   rosuvastatin (CRESTOR) 10 MG tablet TAKE 1 TABLET BY MOUTH EVERY DAY (Patient taking differently: Take 10 mg by mouth daily.) 90 tablet 3   Specialty Vitamins Products (MAGNESIUM, AMINO ACID CHELATE,) 133 MG tablet Take 1 tablet by mouth daily. Dose unknown     UBRELVY 100 MG TABS TAKE 1 TABLET BY MOUTH AS NEEDED. CAN TAKE 1 ADDITIONAL TABLET IN 2 HOURS IF NEEDED AFTER INITIAL DOSE. NO MORE THAN 2 TABLETS IN 24 HOURS. 10 tablet 5   No facility-administered medications prior to visit.    PAST MEDICAL HISTORY: Past Medical History:  Diagnosis Date   3-vessel CAD 03/01/2015   Achalasia 09/28/2012   Allergic rhinitis 03/01/2015   Anemia    Aneurysm ([place]) 03/01/2015   BP (high blood pressure) 03/01/2015   Bronchitis 04/23/2018   Cephalalgia 08/24/2012   Overview:  ICD-10 cut over     Chest pain 04/04/2011   CN (constipation) 09/28/2012   Coughing 04/24/2017   Decreased potassium in the blood 03/01/2015   Diverticulosis    Dizziness 03/01/2015   GERD (gastroesophageal reflux disease)    Hiatal hernia    Hypercholesterolemia 03/01/2015   Hyperlipidemia    Hypertension    Ingrown toenail 08/10/2017   Inguinal hernia    Migraine headache    Onychomycosis 12/08/2017   Paresthesia of arm 03/01/2015   Schatzki's ring    Tendonitis, Achilles, right 12/08/2017   Unilateral primary osteoarthritis, left knee 10/07/2017    PAST SURGICAL HISTORY: Past Surgical History:  Procedure Laterality Date   ABDOMINAL HYSTERECTOMY     BACK SURGERY     x 3   CARPAL TUNNEL RELEASE     left wrist   CERVICAL SPINE SURGERY     HEMORRHOID SURGERY      FAMILY  HISTORY: Family History  Problem Relation Age of Onset   Allergic rhinitis Sister    Diabetes Sister    Hypertension Sister    Heart attack Father 55   Heart disease Father    Stomach cancer Maternal Grandmother    Heart disease Mother    Colon cancer Neg Hx    Angioedema Neg Hx    Asthma Neg Hx    Eczema Neg Hx    Urticaria Neg Hx    Immunodeficiency Neg Hx    Breast cancer Neg Hx    Migraines Neg Hx    Headache Neg Hx     SOCIAL HISTORY: Social History   Socioeconomic History   Marital status: Divorced    Spouse name: Not on file   Number of children: 2   Years of education: Not on file   Highest education level: Some college, no degree  Occupational History   Occupation: Surveyor, quantity: [id number]  Tobacco Use   Smoking status: Former    Packs/day: 0.50    Years: 20.00    Pack years: 10.00    Types: Cigarettes    Quit date: 02/19/1984    Years since quitting: 36.7   Smokeless tobacco: Never  Vaping Use   Vaping Use: Never used  Substance and Sexual Activity   Alcohol use: No   Drug use: No   Sexual activity: Yes    Partners: Male  Other Topics Concern   Not on file  Social History Narrative   Lives alone   Caffeine use: Coffee one cup daily   Right handed    Son is her next of kin  Working at [employer] Strain: Medium Risk   Difficulty of Paying Living Expenses: Somewhat hard  Food Insecurity: No Food Insecurity   Worried About Charity fundraiser in the Last Year: Never true   Arboriculturist in the Last Year: Never true  Transportation Needs: No Transportation Needs   Lack of Transportation (Medical): No   Lack of Transportation (Non-Medical): No  Physical Activity: Inactive   Days of Exercise per Week: 0 days   Minutes of Exercise per Session: 0 min  Stress: Not on file  Social Connections: Not on file  Intimate Partner Violence: Not on file      PHYSICAL EXAM  Vitals:   11/15/20  0820  BP: (!) 151/73  Pulse: 77  Weight: 195 lb (88.5 kg)  Height: 5\' 5"  (1.651 m)     Body mass index is 32.45 kg/m.  Generalized: Well developed, in no acute distress  Cardiology: normal rate and rhythm, no murmur noted Respiratory: Clear to auscultation bilaterally Neurological examination  Mentation: Alert oriented to time, place, history taking. Follows all commands speech and language fluent Cranial nerve II-XII: Pupils were equal round reactive to light. Extraocular movements were full, visual field were full on confrontational test. Facial sensation and strength were normal. Head turning and shoulder shrug  were normal and symmetric. Motor: The motor testing reveals 5 over 5 strength of all 4 extremities. Good symmetric motor tone is noted throughout.  Gait and station: Gait is normal.   DIAGNOSTIC DATA (LABS, IMAGING, TESTING) - I reviewed patient records, labs, notes, testing and imaging myself where available.  No flowsheet data found.   Lab Results  Component Value Date   WBC 6.6 11/07/2020   HGB 10.9 (L) 11/07/2020   HCT 33.3 (L) 11/07/2020   MCV 87.9 11/07/2020   PLT 250 11/07/2020      Component Value Date/Time   NA 141 11/07/2020 2158   NA 143 05/15/2020 1534   K 3.5 11/07/2020 2158   CL 104 11/07/2020 2158   CO2 31 11/07/2020 2158   GLUCOSE 101 (H) 11/07/2020 2158   BUN 16 11/07/2020 2158   BUN 9 05/18/2020 0000   CREATININE 0.86 11/07/2020 2158   CREATININE 0.95 (H) 11/10/2019 1436   CALCIUM 9.0 11/07/2020 2158   PROT 6.9 11/07/2020 2158   PROT 6.2 07/28/2018 0756   ALBUMIN 3.4 (L) 11/07/2020 2158   ALBUMIN 4.0 07/28/2018 0756   AST 18 11/07/2020 2158   ALT 14 11/07/2020 2158   ALKPHOS 63 11/07/2020 2158   BILITOT 0.3 11/07/2020 2158   BILITOT 0.4 07/28/2018 0756   GFRNONAA >60 11/07/2020 2158   GFRAA 82 05/18/2020 0000   Lab Results  Component Value Date   CHOL 161 06/02/2019   HDL 56.00 06/02/2019   LDLCALC 87 06/02/2019   TRIG 98  03/12/2020   CHOLHDL 3 06/02/2019   Lab Results  Component Value Date   HGBA1C 5.9 06/02/2019   Lab Results  Component Value Date   VITAMINB12 1,263 (H) 03/17/2020   Lab Results  Component Value Date   TSH 3.80 03/08/2020       ASSESSMENT AND PLAN 75 y.o. year old female  has a past medical history of 3-vessel CAD (03/01/2015), Achalasia (09/28/2012), Allergic rhinitis (03/01/2015), Anemia, Aneurysm ([place]) (03/01/2015), BP (high blood pressure) (03/01/2015), Bronchitis (04/23/2018), Cephalalgia (08/24/2012), Chest pain (04/04/2011), CN (constipation) (09/28/2012), Coughing (04/24/2017), Decreased potassium in the blood (03/01/2015), Diverticulosis, Dizziness (03/01/2015),  GERD (gastroesophageal reflux disease), Hiatal hernia, Hypercholesterolemia (03/01/2015), Hyperlipidemia, Hypertension, Ingrown toenail (08/10/2017), Inguinal hernia, Migraine headache, Onychomycosis (12/08/2017), Paresthesia of arm (03/01/2015), Schatzki's ring, Tendonitis, Achilles, right (12/08/2017), and Unilateral primary osteoarthritis, left knee (10/07/2017). here with     ICD-10-CM   1. Chronic migraine without aura without status migrainosus, not intractable  G43.709         Stephanie Parks continues to have regular headaches with migrainous symptoms. Ajovy did seem to be effective but was not covered. She took one Emgality injection and called two weeks later to report it was not helping. Unsure if enough time was given to determine effectiveness, however, medication is too expensive for her. Review of Epic shows that she was doing the best with previous neurologist and was taking nortriptyline 50mg  daily at bedtime. She has been inconsistent with taking this medication in the past. I have had a lengthy conversation with her, today, detailing the importance of taking medication consistency and expectations regarding length of time needed to determine if it is going to be effective. I will have her start nortriptyline 25mg  nightly for 1  week then increase dose to 50mg  every night. I have given her [name] 100mg  samples (5 tablets) and given her contact information for Ubrelvy. I have encouraged her to apply for patient assistance with [name]. She will continue Tylenol for abortive therapy. She is doing well on CPAP therapy. Compliance report shows excellent compliance. She was encouraged to continue nightly use for at least 4 hours. She does continue to follow Dr. Christella Noa for history of brain aneurysm. Last CT angio stable 10/2019. She was advised to follow closely with primary care as well for chronic comorbidities, specifically BP management. May be beneficial to increase metoprolol which can also help with migraines if PCP feels this is appropriate.  She will follow-up with me in 3-6 months, sooner if needed.  She verbalizes understanding and agreement with this plan.   I spent 40 minutes with this patient explaining need for BP management and relationship of elevated BP to migraine headaches, review of medications and discussion of care plan.   No orders of the defined types were placed in this encounter.     No orders of the defined types were placed in this encounter.    Debbora Presto, FNP-C 11/15/2020, 8:27 AM [facility] [address] [place], [place] [address] [phone]

## 2020-11-14 NOTE — Progress Notes (Signed)
VIAL MADE. EXP 11-14-21

## 2020-11-15 ENCOUNTER — Encounter: Payer: Self-pay | Admitting: Family Medicine

## 2020-11-15 ENCOUNTER — Ambulatory Visit: Payer: Medicare Other | Admitting: Family Medicine

## 2020-11-15 ENCOUNTER — Other Ambulatory Visit: Payer: Self-pay

## 2020-11-15 ENCOUNTER — Ambulatory Visit (INDEPENDENT_AMBULATORY_CARE_PROVIDER_SITE_OTHER): Payer: Medicare Other | Admitting: Family Medicine

## 2020-11-15 VITALS — BP 151/73 | HR 77 | Ht 65.0 in | Wt 195.0 lb

## 2020-11-15 VITALS — BP 152/80 | HR 68 | Temp 98.2°F | Resp 18 | Ht 66.0 in | Wt 196.6 lb

## 2020-11-15 DIAGNOSIS — E785 Hyperlipidemia, unspecified: Secondary | ICD-10-CM

## 2020-11-15 DIAGNOSIS — I10 Essential (primary) hypertension: Secondary | ICD-10-CM

## 2020-11-15 DIAGNOSIS — Z09 Encounter for follow-up examination after completed treatment for conditions other than malignant neoplasm: Secondary | ICD-10-CM

## 2020-11-15 DIAGNOSIS — G43709 Chronic migraine without aura, not intractable, without status migrainosus: Secondary | ICD-10-CM

## 2020-11-15 DIAGNOSIS — R002 Palpitations: Secondary | ICD-10-CM

## 2020-11-15 MED ORDER — NORTRIPTYLINE HCL 25 MG PO CAPS: 50.0000 mg | ORAL_CAPSULE | Freq: Every day | ORAL | 3 refills | Status: DC

## 2020-11-15 MED ORDER — METOPROLOL SUCCINATE ER 25 MG PO TB24: 25.0000 mg | ORAL_TABLET | Freq: Every day | ORAL | 3 refills | Status: DC

## 2020-11-15 MED ORDER — UBRELVY 100 MG PO TABS: 100.0000 mg | ORAL_TABLET | Freq: Every day | ORAL | 0 refills | Status: DC | PRN

## 2020-11-15 MED ORDER — UBRELVY 50 MG PO TABS: 50.0000 mg | ORAL_TABLET | Freq: Every day | ORAL | 0 refills | Status: DC | PRN

## 2020-11-15 NOTE — Patient Instructions (Signed)
Below is our plan:  We will restart nortriptyline. Please take 1 tablet at bedtime for 1 week then increase dose to 2 tablets every night. Please take this medication consistently for at least 6-8 weeks before determining if you feel it is effective. If you can not tolerate it for any reason, please call me. I do not want you to abruptly stop this medication. I am providing you with 5 samples of Ubrelvy. Please use as needed for migraine abortion. Call [name] as directed to see if you qualify for patient assistance.   Please work closely with PCP for management of your blood pressure.   Please make sure you are staying well hydrated. I recommend 50-60 ounces daily. Well balanced diet and regular exercise encouraged. Consistent sleep schedule with 6-8 hours recommended.   Please continue follow up with care team as directed.   Follow up with me in 4 months   You may receive a survey regarding today's visit. I encourage you to leave honest feed back as I do use this information to improve patient care. Thank you for seeing me today!

## 2020-11-15 NOTE — Patient Instructions (Signed)
It was good to see you again today Please try increasing your metoprolol to 25 mg daily You can then check your BP in a week or so, but please do not panic if the numbers should be elevated. Please let me know your BP and pulse in about a week

## 2020-11-16 DIAGNOSIS — J3089 Other allergic rhinitis: Secondary | ICD-10-CM | POA: Diagnosis not present

## 2020-11-16 NOTE — Telephone Encounter (Signed)
Lm for pt stating she needed to follow up with her eye dr with the issues concerning the circle on chest after marks on arm

## 2020-11-17 DIAGNOSIS — I25119 Atherosclerotic heart disease of native coronary artery with unspecified angina pectoris: Secondary | ICD-10-CM

## 2020-11-17 DIAGNOSIS — I10 Essential (primary) hypertension: Secondary | ICD-10-CM | POA: Diagnosis not present

## 2020-11-17 DIAGNOSIS — E785 Hyperlipidemia, unspecified: Secondary | ICD-10-CM

## 2020-11-20 ENCOUNTER — Telehealth: Payer: Medicare Other

## 2020-11-20 ENCOUNTER — Telehealth: Payer: Self-pay

## 2020-11-20 NOTE — Telephone Encounter (Signed)
  Care Management   Follow Up Note   11/20/2020 Name: Stephanie Parks MRN: [id number] DOB: 24-Jul-1945   Referred by: [name], MD Reason for referral : Chronic Care Management (RNCM follow up)   An unsuccessful telephone outreach was attempted today. The patient was referred to the case management team for assistance with care management and care coordination.   Follow Up Plan: The care management team will reach out to the patient again over the next 30 days.   Thea Silversmith, RN, MSN, BSN, CCM Care Management Coordinator [facility] [phone]

## 2020-11-21 ENCOUNTER — Telehealth: Payer: Self-pay | Admitting: *Deleted

## 2020-11-21 NOTE — Chronic Care Management (AMB) (Signed)
  Care Management   Note  11/21/2020 Name: WISDOM SEYBOLD MRN: [id number] DOB: November 18, 1945  [name] is a 75 y.o. year old female who is a primary care patient of Copland, Gay Filler, MD and is actively engaged with the care management team. I reached out to [name] by phone today to assist with re-scheduling a follow up visit with the RN Case Manager  Follow up plan: Unsuccessful telephone outreach attempt made. A HIPAA compliant phone message was left for the patient providing contact information and requesting a return call.   Julian Hy, [place] Management  Direct Dial: [phone]

## 2020-11-22 ENCOUNTER — Encounter: Payer: Self-pay | Admitting: Orthopaedic Surgery

## 2020-11-22 ENCOUNTER — Ambulatory Visit (INDEPENDENT_AMBULATORY_CARE_PROVIDER_SITE_OTHER): Payer: Medicare Other | Admitting: Orthopaedic Surgery

## 2020-11-22 DIAGNOSIS — M1612 Unilateral primary osteoarthritis, left hip: Secondary | ICD-10-CM | POA: Diagnosis not present

## 2020-11-22 NOTE — Progress Notes (Signed)
Office Visit Note   Patient: Stephanie Parks           Date of Birth: May 21, 1945           MRN: [id number] Visit Date: 11/22/2020              Requested by: Garald Balding, [place],  [place] [id number] PCP: [name], MD   Assessment & Plan: Visit Diagnoses:  1. Unilateral primary osteoarthritis, left hip     Plan: Discussed treatment options with the patient.  She has failed conservative measures at this point and would like to proceed with a left total hip arthroplasty in the future.  However due to her blood pressure issues and occasional "atrial flutter" she will need cardiac clearance prior to any surgical intervention for the left hip.  Left hip arthroplasty surgery was discussed with patient at length perioperative and postoperative protocol discussed.  Risk benefits discussed with her at length.  Risk include but are not limited to PE/DVT, wound healing problems, infection, prolonged pain, nerve vessel injury and leg length discrepancy.  She is due to see her cardiologist in November we will need clearance prior to scheduling for surgery.  Surgical sheet was filled out however.  She will call us and let us know when she has her blood pressure under control and has clearance from cardiology.  Questions encouraged and answered by Dr. Ninfa Linden myself  Follow-Up Instructions: No follow-ups on file.   Orders:  No orders of the defined types were placed in this encounter.  No orders of the defined types were placed in this encounter.     Procedures: No procedures performed   Clinical Data: No additional findings.   Subjective: Chief Complaint  Patient presents with   Left Hip - Pain    HPI Mrs. Rabalais is a pleasant 75 year old female comes in today for left hip pain.  She seen other providers in the office and wanted to see Dr. Ninfa Linden for possible left hip replacement.  She did undergo a MRI of her left hip was ordered by Dr. Durward Fortes.   This is reviewed and shows severe left hip arthritis with superior lateral cartilage loss.  Also subchondral bony edema in this area.  No evidence of AVN.  Degenerative superior labral tear was also noted.  Previously she has had left hip intra-articular injection which gave her no real relief.  She is ambulating well with a cane.  She takes Tylenol for the pain helps some.  Pain is located left hip groin does radiate down the legs.  She has had no known injury.  She has some difficulty donning socks and shoes has to pull her left hip up. Prior radiographs AP pelvis and lateral view of the left hip shows periarticular spurring with near bone-on-bone narrowing of the left hip joint.  Earlier this year she was hospitalized with COVID and developed a pulmonary embolism.  She was placed on Eliquis but has came off of Eliquis since April of this year.  She denies any fevers chills, chest pain shortness of breath.  However she does note that she has bouts of hypertension and hypotension occasional atrial flutter.  Review of Systems  Constitutional:  Negative for chills and fever.  Cardiovascular:  Negative for chest pain.    Objective: Vital Signs: There were no vitals taken for this visit.  Physical Exam Constitutional:      Appearance: She is not ill-appearing or diaphoretic.  Pulmonary:  Effort: Pulmonary effort is normal.  Neurological:     Mental Status: She is alert and oriented to person, place, and time.  Psychiatric:        Mood and Affect: Mood normal.    Ortho Exam Right hip excellent range of motion without pain.  Left hip she has full external rotation of the hip without pain.  Internal rotation is limited and painful.  Dorsiflexion plantarflexion bilateral ankles intact. Specialty Comments:  No specialty comments available.  Imaging: No results found.   PMFS History: Patient Active Problem List   Diagnosis Date Noted   Unilateral primary osteoarthritis, left hip  10/18/2020   Greater trochanteric bursitis of left hip 10/18/2020   Headache 06/07/2020   Acute respiratory failure with hypoxia ([place]) 05/02/2020   Post-COVID chronic dyspnea 05/02/2020   Pulsatile neck mass 04/26/2020   Pneumonia due to COVID-19 virus 03/12/2020   Headache disorder 01/04/2020   Seasonal allergic conjunctivitis 10/04/2019   Dysfunction of right eustachian tube 10/04/2019   Pre-diabetes 06/02/2019   Seasonal and perennial allergic rhinitis 12/22/2018   Heart palpitations 12/22/2018   History of chest pain 07/20/2018   Brain aneurysm 07/20/2018   Dyslipidemia 05/04/2018   Bronchitis 04/23/2018   Tendonitis, Achilles, right 12/08/2017   Onychomycosis 12/08/2017   Unilateral primary osteoarthritis, left knee 10/07/2017   Ingrown toenail 08/10/2017   Coughing 04/24/2017   CAD (coronary artery disease) 03/01/2015   Aneurysm ([place]) 03/01/2015   Dizziness 03/01/2015   Paresthesia of arm 03/01/2015   Seasonal allergic rhinitis 03/01/2015   Achalasia 09/28/2012   CN (constipation) 09/28/2012   Cephalalgia 08/24/2012   Essential hypertension 04/19/2011   Hyperlipidemia 04/19/2011   GERD (gastroesophageal reflux disease) 04/19/2011   Chest pain 04/04/2011   Past Medical History:  Diagnosis Date   3-vessel CAD 03/01/2015   Achalasia 09/28/2012   Allergic rhinitis 03/01/2015   Anemia    Aneurysm ([place]) 03/01/2015   BP (high blood pressure) 03/01/2015   Bronchitis 04/23/2018   Cephalalgia 08/24/2012   Overview:  ICD-10 cut over     Chest pain 04/04/2011   CN (constipation) 09/28/2012   Coughing 04/24/2017   Decreased potassium in the blood 03/01/2015   Diverticulosis    Dizziness 03/01/2015   GERD (gastroesophageal reflux disease)    Hiatal hernia    Hypercholesterolemia 03/01/2015   Hyperlipidemia    Hypertension    Ingrown toenail 08/10/2017   Inguinal hernia    Migraine headache    Onychomycosis 12/08/2017   Paresthesia of arm 03/01/2015   Schatzki's ring     Tendonitis, Achilles, right 12/08/2017   Unilateral primary osteoarthritis, left knee 10/07/2017    Family History  Problem Relation Age of Onset   Allergic rhinitis Sister    Diabetes Sister    Hypertension Sister    Heart attack Father 13   Heart disease Father    Stomach cancer Maternal Grandmother    Heart disease Mother    Colon cancer Neg Hx    Angioedema Neg Hx    Asthma Neg Hx    Eczema Neg Hx    Urticaria Neg Hx    Immunodeficiency Neg Hx    Breast cancer Neg Hx    Migraines Neg Hx    Headache Neg Hx     Past Surgical History:  Procedure Laterality Date   ABDOMINAL HYSTERECTOMY     BACK SURGERY     x 3   CARPAL TUNNEL RELEASE     left wrist   CERVICAL SPINE  SURGERY     HEMORRHOID SURGERY     Social History   Occupational History   Occupation: Surveyor, quantity: [id number]  Tobacco Use   Smoking status: Former    Packs/day: 0.50    Years: 20.00    Pack years: 10.00    Types: Cigarettes    Quit date: 02/19/1984    Years since quitting: 36.7   Smokeless tobacco: Never  Vaping Use   Vaping Use: Never used  Substance and Sexual Activity   Alcohol use: No   Drug use: No   Sexual activity: Yes    Partners: Male

## 2020-11-23 NOTE — Chronic Care Management (AMB) (Signed)
  Care Management   Note  11/23/2020 Name: [name] MRN: [id number] DOB: 03-May-1945  Stephanie Parks is a 75 y.o. year old female who is a primary care patient of Copland, Gay Filler, MD and is actively engaged with the care management team. I reached out to Stephanie Parks by phone today to assist with re-scheduling a follow up visit with the RN Case Manager  Follow up plan: Telephone appointment with care management team member scheduled for: 11/28/2020  Julian Hy, [place], [place] Management  Direct Dial: [phone]

## 2020-11-27 NOTE — Progress Notes (Deleted)
FOLLOW UP Date of Service/Encounter:  11/27/20   Subjective:  Stephanie Parks (DOB: 1945-07-28) is a 75 y.o. female who returns to the [facility] on 11/29/2020 in re-evaluation of the following: *** History obtained from: chart review and {Persons; PED relatives w/patient:19415::"patient"}.  For Review, LV was on 04/04/2020 with Althea Charon, FNP seen for allergic rhinitis and conjunctivitis (Flonase, azelastine, nasal saline refresh eyedrops per ophthalmology and allergy shots), eustachian tube dysfunction, and new problem of dyspnea following COVID-19 pneumonia now on oxygen.   She was provided albuterol, but instructed to follow-up with pulmonary due to restrictive lung disease and new oxygen requirement. She is on allergy shots, and has been on maintenance since May 2009.  Tried stopping immunotherapy for period of 4 weeks but felt symptoms worsen, so has resumed injections and would like to continue indefinitely.  Last administered on 11/09/2020.  Received 1 vial-molds, cockroach, weeds.  She is in maintenance vial at 0.5 every 4 weeks. Last spiro: FVC 0.98 L, FEV1 0.97 L.  Predicted FVC 2.35 L, FEV1 1.82 L.  Spirometry indicates severe restriction.  Status post bronchodilator response shows FVC 1.02 L, FEV1 0.97 L.  Spirometry indicates severe restriction with no significant bronchodilator response. Allergies as of 11/29/2020       Reactions   Shellfish Allergy Hives, Swelling   Ace Inhibitors Other (See Comments)   Pt cannot recall her reaction but tolerates arb    Gabapentin Other (See Comments)   headaches   Pitavastatin    Unknown reaction    Topiramate    Heart race,    Sulfa Antibiotics Other (See Comments), Rash   Fine bumps        Medication List        Accurate as of November 27, 2020  8:56 AM. If you have any questions, ask your nurse or doctor.          acetaminophen 325 MG tablet Commonly known as: TYLENOL Take 650 mg by mouth. For  headaches   CALCIUM 600 + D PO Take 1 tablet by mouth daily.   ELDERBERRY PO Take 50 mg by mouth daily. Also contains zinc and vitamin C   Emgality 120 MG/ML Soaj Generic drug: Galcanezumab-gnlm Inject 120 mg into the skin every 30 (thirty) days.   EPINEPHrine 0.3 mg/0.3 mL Soaj injection Commonly known as: EpiPen 2-Pak Inject 0.3 mLs (0.3 mg total) into the muscle as needed for anaphylaxis. Per allergen immunotherapy protocol   fluticasone 50 MCG/ACT nasal spray Commonly known as: FLONASE USE 2 SPRAYS IN NOSTRIL(S) DAILY AS NEEDED.  In the right nostril point the applicator out toward your right ear.  In the left nostril point the applicator out toward her left ear What changed:  how much to take how to take this when to take this reasons to take this additional instructions   losartan 100 MG tablet Commonly known as: COZAAR Take 1 tablet (100 mg total) by mouth daily.   magnesium (amino acid chelate) 133 MG tablet Take 1 tablet by mouth daily. Dose unknown   METAMUCIL FIBER PO Take by mouth.   metoprolol succinate 25 MG 24 hr tablet Commonly known as: TOPROL-XL Take 1 tablet (25 mg total) by mouth daily.   Multivital tablet Take 1 tablet by mouth daily.   nortriptyline 25 MG capsule Commonly known as: PAMELOR Take 2 capsules (50 mg total) by mouth at bedtime.   omeprazole 40 MG capsule Commonly known as: PRILOSEC Take 40 mg by  mouth daily.   potassium chloride SA 20 MEQ tablet Commonly known as: Klor-Con M20 TAKE 2 TABLETS BY MOUTH EVERY DAY   rosuvastatin 10 MG tablet Commonly known as: CRESTOR TAKE 1 TABLET BY MOUTH EVERY DAY   Ubrelvy 100 MG Tabs Generic drug: Ubrogepant TAKE 1 TABLET BY MOUTH AS NEEDED. CAN TAKE 1 ADDITIONAL TABLET IN 2 HOURS IF NEEDED AFTER INITIAL DOSE. NO MORE THAN 2 TABLETS IN 24 HOURS.   Ubrelvy 100 MG Tabs Generic drug: Ubrogepant Take 100 mg by mouth daily as needed. Take one tablet at onset of headache, may repeat 1  tablet in 2 hours, no more than 2 tablets in 24 hours       Past Medical History:  Diagnosis Date   3-vessel CAD 03/01/2015   Achalasia 09/28/2012   Allergic rhinitis 03/01/2015   Anemia    Aneurysm ([place]) 03/01/2015   BP (high blood pressure) 03/01/2015   Bronchitis 04/23/2018   Cephalalgia 08/24/2012   Overview:  ICD-10 cut over     Chest pain 04/04/2011   CN (constipation) 09/28/2012   Coughing 04/24/2017   Decreased potassium in the blood 03/01/2015   Diverticulosis    Dizziness 03/01/2015   GERD (gastroesophageal reflux disease)    Hiatal hernia    Hypercholesterolemia 03/01/2015   Hyperlipidemia    Hypertension    Ingrown toenail 08/10/2017   Inguinal hernia    Migraine headache    Onychomycosis 12/08/2017   Paresthesia of arm 03/01/2015   Schatzki's ring    Tendonitis, Achilles, right 12/08/2017   Unilateral primary osteoarthritis, left knee 10/07/2017   Past Surgical History:  Procedure Laterality Date   ABDOMINAL HYSTERECTOMY     BACK SURGERY     x 3   CARPAL TUNNEL RELEASE     left wrist   CERVICAL SPINE SURGERY     HEMORRHOID SURGERY     Otherwise, there have been no changes to her past medical history, surgical history, family history, or social history.  ROS: All others negative except as noted per HPI.   Objective:  There were no vitals taken for this visit. There is no height or weight on file to calculate BMI. Physical Exam: General Appearance:  Alert, cooperative, no distress, appears stated age  Head:  Normocephalic, without obvious abnormality, atraumatic  Eyes:  Conjunctiva clear, EOM's intact  Nose: Nares normal  Throat: Lips, tongue normal; teeth and gums normal  Neck: Supple, symmetrical  Lungs:   Respirations unlabored, no coughing  Heart:  Appears well perfused  Extremities: No edema  Skin: Skin color, texture, turgor normal, no rashes or lesions on visualized portions of skin  Neurologic: No gross deficits   Reviewed: ***  Spirometry:   Tracings reviewed. Her effort: {Blank single:19197::"Good reproducible efforts.","It was hard to get consistent efforts and there is a question as to whether this reflects a maximal maneuver.","Poor effort, data can not be interpreted."} FVC: ***L FEV1: ***L, ***% predicted FEV1/FVC ratio: ***% Interpretation: {Blank single:19197::"Spirometry consistent with mild obstructive disease","Spirometry consistent with moderate obstructive disease","Spirometry consistent with severe obstructive disease","Spirometry consistent with possible restrictive disease","Spirometry consistent with mixed obstructive and restrictive disease","Spirometry uninterpretable due to technique","Spirometry consistent with normal pattern","No overt abnormalities noted given today's efforts"}.  Please see scanned spirometry results for details.  Skin Testing: {Blank single:19197::"Select foods","Environmental allergy panel","Environmental allergy panel and select foods","Food allergy panel","None","Deferred due to recent antihistamines use"}. Positive test to: ***. Negative test to: ***.  Results discussed with patient/family.   {Blank single:19197::"Allergy testing results were  read and interpreted by myself, documented by clinical staff."," "}  Assessment:  No diagnosis found.  Plan/Recommendations:  There are no Patient Instructions on file for this visit.  No follow-ups on file.  Sigurd Sos, MD  [facility] [facility]

## 2020-11-28 ENCOUNTER — Ambulatory Visit (INDEPENDENT_AMBULATORY_CARE_PROVIDER_SITE_OTHER): Payer: Medicare Other

## 2020-11-28 DIAGNOSIS — E785 Hyperlipidemia, unspecified: Secondary | ICD-10-CM

## 2020-11-28 DIAGNOSIS — I10 Essential (primary) hypertension: Secondary | ICD-10-CM

## 2020-11-28 NOTE — Patient Instructions (Signed)
Visit Information  PATIENT GOALS:  Goals Addressed             This Visit's Progress    Track and Manage My Blood Pressure-Hypertension       Timeframe:  Long-Range Goal Priority:  Medium Start Date:  05/08/20                           Expected End Date:  03/17/21                    Follow Up Date 12/28/20  Patient Goals: Contact cardiologist or primary care provider if heart racing episode continues or worsens. Ask provider "what is my target blood pressure range". Continue to remain active per provider's recommendations. Continue to check blood pressure periodically as recommended by provider and take log to provider appointments. Attend provider visits as scheduled Continue to eat healthy: low salt and heart healthy meals with fruits, vegetables, whole grains, lean protein and limit fats and sugars. Continue to take medications as prescribed Continue to collaborate with clinic pharmacist regarding medications        Patient verbalizes understanding of instructions provided today and agrees to view in [place].   Telephone follow up appointment with care management team member scheduled for:12/28/20 The patient has been provided with contact information for the care management team and has been advised to call with any health related questions or concerns.   Thea Silversmith, RN, MSN, BSN, CCM Care Management Coordinator [facility] [phone]

## 2020-11-28 NOTE — Chronic Care Management (AMB) (Signed)
Chronic Care Management   CCM RN Visit Note  11/28/2020 Name: Stephanie Parks MRN: [id number] DOB: 10-Jun-1945  Subjective: Stephanie Parks is a 75 y.o. year old female who is a primary care patient of Copland, Gay Filler, MD. The care management team was consulted for assistance with disease management and care coordination needs.    Engaged with patient by telephone for follow up visit in response to provider referral for case management and/or care coordination services.   Consent to Services:  The patient was given information about Chronic Care Management services, agreed to services, and gave verbal consent prior to initiation of services.  Please see initial visit note for detailed documentation.   Patient agreed to services and verbal consent obtained.   Assessment: Review of patient past medical history, allergies, medications, health status, including review of consultants reports, laboratory and other test data, was performed as part of comprehensive evaluation and provision of chronic care management services.   SDOH (Social Determinants of Health) assessments and interventions performed:    CCM Care Plan  Allergies  Allergen Reactions   Shellfish Allergy Hives and Swelling   Ace Inhibitors Other (See Comments)    Pt cannot recall her reaction but tolerates arb    Gabapentin Other (See Comments)    headaches   Pitavastatin     Unknown reaction    Topiramate     Heart race,    Sulfa Antibiotics Other (See Comments) and Rash    Fine bumps    Outpatient Encounter Medications as of 11/28/2020  Medication Sig Note   acetaminophen (TYLENOL) 325 MG tablet Take 650 mg by mouth. For headaches    Calcium Carb-Cholecalciferol (CALCIUM 600 + D PO) Take 1 tablet by mouth daily.    ELDERBERRY PO Take 50 mg by mouth daily. Also contains zinc and vitamin C    fluticasone (FLONASE) 50 MCG/ACT nasal spray USE 2 SPRAYS IN NOSTRIL(S) DAILY AS NEEDED.  In the right nostril point the  applicator out toward your right ear.  In the left nostril point the applicator out toward her left ear (Patient taking differently: Place 2 sprays into both nostrils daily as needed for allergies.)    losartan (COZAAR) 100 MG tablet Take 1 tablet (100 mg total) by mouth daily.    METAMUCIL FIBER PO Take by mouth. 09/14/2020: Reports takes every other day (powder form)   metoprolol succinate (TOPROL-XL) 25 MG 24 hr tablet Take 1 tablet (25 mg total) by mouth daily. 11/28/2020: Reports she is taking 1/2 daily   Multiple Vitamins-Minerals (MULTIVITAL) tablet Take 1 tablet by mouth daily.  08/17/2020: Alive vit   nortriptyline (PAMELOR) 25 MG capsule Take 2 capsules (50 mg total) by mouth at bedtime.    potassium chloride SA (KLOR-CON M20) 20 MEQ tablet TAKE 2 TABLETS BY MOUTH EVERY DAY 11/28/2020: Reports she is taking every other day.   rosuvastatin (CRESTOR) 10 MG tablet TAKE 1 TABLET BY MOUTH EVERY DAY (Patient taking differently: Take 10 mg by mouth daily.)    UBRELVY 100 MG TABS TAKE 1 TABLET BY MOUTH AS NEEDED. CAN TAKE 1 ADDITIONAL TABLET IN 2 HOURS IF NEEDED AFTER INITIAL DOSE. NO MORE THAN 2 TABLETS IN 24 HOURS.    EPINEPHrine (EPIPEN 2-PAK) 0.3 mg/0.3 mL IJ SOAJ injection Inject 0.3 mLs (0.3 mg total) into the muscle as needed for anaphylaxis. Per allergen immunotherapy protocol    Galcanezumab-gnlm (EMGALITY) 120 MG/ML SOAJ Inject 120 mg into the skin every 30 (thirty) days. (Patient  not taking: Reported on 11/28/2020)    omeprazole (PRILOSEC) 40 MG capsule Take 40 mg by mouth daily.    Specialty Vitamins Products (MAGNESIUM, AMINO ACID CHELATE,) 133 MG tablet Take 1 tablet by mouth daily. Dose unknown (Patient not taking: Reported on 11/28/2020)    Ubrogepant (UBRELVY) 100 MG TABS Take 100 mg by mouth daily as needed. Take one tablet at onset of headache, may repeat 1 tablet in 2 hours, no more than 2 tablets in 24 hours    No facility-administered encounter medications on file as of  11/28/2020.    Patient Active Problem List   Diagnosis Date Noted   Unilateral primary osteoarthritis, left hip 10/18/2020   Greater trochanteric bursitis of left hip 10/18/2020   Headache 06/07/2020   Acute respiratory failure with hypoxia ([place]) 05/02/2020   Post-COVID chronic dyspnea 05/02/2020   Pulsatile neck mass 04/26/2020   Pneumonia due to COVID-19 virus 03/12/2020   Headache disorder 01/04/2020   Seasonal allergic conjunctivitis 10/04/2019   Dysfunction of right eustachian tube 10/04/2019   Pre-diabetes 06/02/2019   Seasonal and perennial allergic rhinitis 12/22/2018   Heart palpitations 12/22/2018   History of chest pain 07/20/2018   Brain aneurysm 07/20/2018   Dyslipidemia 05/04/2018   Bronchitis 04/23/2018   Tendonitis, Achilles, right 12/08/2017   Onychomycosis 12/08/2017   Unilateral primary osteoarthritis, left knee 10/07/2017   Ingrown toenail 08/10/2017   Coughing 04/24/2017   CAD (coronary artery disease) 03/01/2015   Aneurysm ([place]) 03/01/2015   Dizziness 03/01/2015   Paresthesia of arm 03/01/2015   Seasonal allergic rhinitis 03/01/2015   Achalasia 09/28/2012   CN (constipation) 09/28/2012   Cephalalgia 08/24/2012   Essential hypertension 04/19/2011   Hyperlipidemia 04/19/2011   GERD (gastroesophageal reflux disease) 04/19/2011   Chest pain 04/04/2011    Conditions to be addressed/monitored:HTN, HLD, and GERD, Palpitations  Care Plan : Hypertension (Adult)  Updates made by Luretha Rued, RN since 11/28/2020 12:00 AM     Problem: Disease Progression (Hypertension)      Long-Range Goal: Disease Progression Prevented or Minimized   Start Date: 05/08/2020  Expected End Date: 08/06/2020  This Visit's Progress: On track  Recent Progress: On track  Priority: Medium  Note:   Objective:  Last practice recorded BP readings:  BP Readings from Last 3 Encounters:  11/15/20 (!) 152/80  11/15/20 (!) 151/73  11/07/20 (!) 164/60  Most recent eGFR/CrCl:   Lab Results  Component Value Date   EGFR 80 05/15/2020  Current Barriers:  Knowledge Deficits related to long term self-management of hypertension in a patient with history of GERD, Pre-diabetes, CAD, Brain aneurysm. She states she has not checked blood pressure in a while. She also reports heart racing episodes for past couple weeks-not having at time of telephone assessment. She last checked blood pressure at home on 11/15/20 143/65 HR 60. Patient checked blood pressure during assessment with RNCM today and reports BP 139/72 HR 77. She states she has never checked her blood pressure/HR during an episode of feeling her heart racing. Reports per orthopedic provider she is in need of left hip replacement, but has to have blood pressure under control. She reports some improvement of migraines with initiation of Nortriptyline. Per patient, stomach test was negative, but states she still feels bloating when she eats. Continues to work part-time. Case Manager Clinical Goal(s):  patient will verbalize understanding of plan for hypertension management patient will attend all scheduled medical appointments patient will demonstrate improved adherence to prescribed treatment plan for hypertension  as evidenced by taking all medications as prescribed, monitoring and recording blood pressure as directed, adhering to low sodium/DASH diet; taking medications as prescribed; knowing target parameters for blood pressure and calling provider with questions/concerns as needed. Interventions:  Collaboration with Copland, Gay Filler, MD regarding development and update of comprehensive plan of care as evidenced by provider attestation and co-signature Inter-disciplinary care team collaboration (see longitudinal plan of care) Discussed low salt diet, food to avoid and foods to eat.  Encouraged patient to check blood pressure/HR when she feels "heart racing episodes". Next cardiologist appointment scheduled for 12/20/20.  Patient states she will call to see if she is able to get a sooner appointment. Medications reviewed with patient and encouraged to continue taking as prescribed- patient is taking some medications differently than prescribed(Metoprolol). Reviewed upcoming/scheduled appointments. Discussed plans with patient for ongoing care management follow up and provided patient with direct contact information for care management team Self-Care Activities: Take medications as prescribed Attends scheduled provider appointments Calls provider office for new concerns, questions Monitors Blood pressure Patient Goals: Contact cardiologist or primary care provider if heart racing episode continues or worsens. Ask provider "what is my target blood pressure range". Continue to remain active per provider's recommendations. Continue to check blood pressure periodically as recommended by provider and take log to provider appointments. Attend provider visits as scheduled Continue to eat healthy: low salt and heart healthy meals with fruits, vegetables, whole grains, lean protein and limit fats and sugars. Continue to take medications as prescribed Continue to collaborate with clinic pharmacist regarding medications Follow Up Plan: The patient has been provided with contact information for the care management team and has been advised to call with any health related questions or concerns.  The care management team will reach out to the patient again next month.     Plan:Telephone follow up appointment with care management team member scheduled for:  next month and The patient has been provided with contact information for the care management team and has been advised to call with any health related questions or concerns.   Thea Silversmith, RN, MSN, BSN, CCM Care Management Coordinator [facility] [phone]

## 2020-11-29 ENCOUNTER — Ambulatory Visit: Payer: Medicare Other | Admitting: Internal Medicine

## 2020-12-06 ENCOUNTER — Ambulatory Visit: Payer: Medicare Other | Admitting: Family Medicine

## 2020-12-11 ENCOUNTER — Telehealth: Payer: Self-pay

## 2020-12-11 NOTE — Chronic Care Management (AMB) (Signed)
    Chronic Care Management Pharmacy Assistant   Name: Stephanie Parks  MRN: [id number] DOB: [birth year]  12/11/20 Attempted to call patient in reguards to linzess and Emgality paps. Patient was supposed to send in those forms.   12/14/20 I was able to reach patient and she stated that she has not sent in those forms yet, She stated that she is going to turn those in today.   Andee Poles, CMA

## 2020-12-11 NOTE — Progress Notes (Deleted)
[place] [facility] [address] [place], [place] [address] [phone]  Date:  12/13/2020   Name:  Stephanie Parks   DOB:  1946-01-08   MRN:  [id number]  PCP:  [name], MD    Chief Complaint: No chief complaint on file.   History of Present Illness:  Stephanie Parks is a 75 y.o. very pleasant female patient who presents with the following:  Patient seen today for short-term follow-up Most recent visit with myself in September for ER follow-up History of hypertension, hyperlipidemia, allergic rhinitis, OSA on CPAP. She has tended to have frequent ER visits for chest pain likely related to anxiety, and also for concerns about blood pressure She also had [id number] complicated by pulmonary embolism in January of this year.  She has completed treatment with Eliquis and is no longer using oxygen  At her last visit she had recently started on nortriptyline for headaches, we are hopeful this might also decrease anxiety We also cautiously increase her dose of beta-blocker for elevated blood pressure  Pneumonia vaccine Tetanus Shingrix COVID booster Flu shot  Patient Active Problem List   Diagnosis Date Noted   Unilateral primary osteoarthritis, left hip 10/18/2020   Greater trochanteric bursitis of left hip 10/18/2020   Headache 06/07/2020   Acute respiratory failure with hypoxia ([place]) 05/02/2020   Post-COVID chronic dyspnea 05/02/2020   Pulsatile neck mass 04/26/2020   Pneumonia due to COVID-19 virus 03/12/2020   Headache disorder 01/04/2020   Seasonal allergic conjunctivitis 10/04/2019   Dysfunction of right eustachian tube 10/04/2019   Pre-diabetes 06/02/2019   Seasonal and perennial allergic rhinitis 12/22/2018   Heart palpitations 12/22/2018   History of chest pain 07/20/2018   Brain aneurysm 07/20/2018   Dyslipidemia 05/04/2018   Bronchitis 04/23/2018   Tendonitis, Achilles, right 12/08/2017    Onychomycosis 12/08/2017   Unilateral primary osteoarthritis, left knee 10/07/2017   Ingrown toenail 08/10/2017   Coughing 04/24/2017   CAD (coronary artery disease) 03/01/2015   Aneurysm ([place]) 03/01/2015   Dizziness 03/01/2015   Paresthesia of arm 03/01/2015   Seasonal allergic rhinitis 03/01/2015   Achalasia 09/28/2012   CN (constipation) 09/28/2012   Cephalalgia 08/24/2012   Essential hypertension 04/19/2011   Hyperlipidemia 04/19/2011   GERD (gastroesophageal reflux disease) 04/19/2011   Chest pain 04/04/2011    Past Medical History:  Diagnosis Date   3-vessel CAD 03/01/2015   Achalasia 09/28/2012   Allergic rhinitis 03/01/2015   Anemia    Aneurysm ([place]) 03/01/2015   BP (high blood pressure) 03/01/2015   Bronchitis 04/23/2018   Cephalalgia 08/24/2012   Overview:  ICD-10 cut over     Chest pain 04/04/2011   CN (constipation) 09/28/2012   Coughing 04/24/2017   Decreased potassium in the blood 03/01/2015   Diverticulosis    Dizziness 03/01/2015   GERD (gastroesophageal reflux disease)    Hiatal hernia    Hypercholesterolemia 03/01/2015   Hyperlipidemia    Hypertension    Ingrown toenail 08/10/2017   Inguinal hernia    Migraine headache    Onychomycosis 12/08/2017   Paresthesia of arm 03/01/2015   Schatzki's ring    Tendonitis, Achilles, right 12/08/2017   Unilateral primary osteoarthritis, left knee 10/07/2017    Past Surgical History:  Procedure Laterality Date   ABDOMINAL HYSTERECTOMY     BACK SURGERY     x 3   CARPAL TUNNEL RELEASE     left wrist   CERVICAL SPINE  SURGERY     HEMORRHOID SURGERY      Social History   Tobacco Use   Smoking status: Former    Packs/day: 0.50    Years: 20.00    Pack years: 10.00    Types: Cigarettes    Quit date: 02/19/1984    Years since quitting: 36.8   Smokeless tobacco: Never  Vaping Use   Vaping Use: Never used  Substance Use Topics   Alcohol use: No   Drug use: No    Family History  Problem Relation Age of Onset    Allergic rhinitis Sister    Diabetes Sister    Hypertension Sister    Heart attack Father 35   Heart disease Father    Stomach cancer Maternal Grandmother    Heart disease Mother    Colon cancer Neg Hx    Angioedema Neg Hx    Asthma Neg Hx    Eczema Neg Hx    Urticaria Neg Hx    Immunodeficiency Neg Hx    Breast cancer Neg Hx    Migraines Neg Hx    Headache Neg Hx     Allergies  Allergen Reactions   Shellfish Allergy Hives and Swelling   Ace Inhibitors Other (See Comments)    Pt cannot recall her reaction but tolerates arb    Gabapentin Other (See Comments)    headaches   Pitavastatin     Unknown reaction    Topiramate     Heart race,    Sulfa Antibiotics Other (See Comments) and Rash    Fine bumps    Medication list has been reviewed and updated.  Current Outpatient Medications on File Prior to Visit  Medication Sig Dispense Refill   acetaminophen (TYLENOL) 325 MG tablet Take 650 mg by mouth. For headaches     Calcium Carb-Cholecalciferol (CALCIUM 600 + D PO) Take 1 tablet by mouth daily.     ELDERBERRY PO Take 50 mg by mouth daily. Also contains zinc and vitamin C     EPINEPHrine (EPIPEN 2-PAK) 0.3 mg/0.3 mL IJ SOAJ injection Inject 0.3 mLs (0.3 mg total) into the muscle as needed for anaphylaxis. Per allergen immunotherapy protocol 1 each 2   fluticasone (FLONASE) 50 MCG/ACT nasal spray USE 2 SPRAYS IN NOSTRIL(S) DAILY AS NEEDED.  In the right nostril point the applicator out toward your right ear.  In the left nostril point the applicator out toward her left ear (Patient taking differently: Place 2 sprays into both nostrils daily as needed for allergies.) 16 g 9   Galcanezumab-gnlm (EMGALITY) 120 MG/ML SOAJ Inject 120 mg into the skin every 30 (thirty) days. (Patient not taking: Reported on 11/28/2020) 1.12 mL 11   losartan (COZAAR) 100 MG tablet Take 1 tablet (100 mg total) by mouth daily. 90 tablet 1   METAMUCIL FIBER PO Take by mouth.     metoprolol succinate  (TOPROL-XL) 25 MG 24 hr tablet Take 1 tablet (25 mg total) by mouth daily. 90 tablet 3   Multiple Vitamins-Minerals (MULTIVITAL) tablet Take 1 tablet by mouth daily.      nortriptyline (PAMELOR) 25 MG capsule Take 2 capsules (50 mg total) by mouth at bedtime. 180 capsule 3   omeprazole (PRILOSEC) 40 MG capsule Take 40 mg by mouth daily.     potassium chloride SA (KLOR-CON M20) 20 MEQ tablet TAKE 2 TABLETS BY MOUTH EVERY DAY 180 tablet 1   rosuvastatin (CRESTOR) 10 MG tablet TAKE 1 TABLET BY MOUTH EVERY DAY (Patient taking  differently: Take 10 mg by mouth daily.) 90 tablet 3   Specialty Vitamins Products (MAGNESIUM, AMINO ACID CHELATE,) 133 MG tablet Take 1 tablet by mouth daily. Dose unknown (Patient not taking: Reported on 11/28/2020)     UBRELVY 100 MG TABS TAKE 1 TABLET BY MOUTH AS NEEDED. CAN TAKE 1 ADDITIONAL TABLET IN 2 HOURS IF NEEDED AFTER INITIAL DOSE. NO MORE THAN 2 TABLETS IN 24 HOURS. 10 tablet 5   Ubrogepant (UBRELVY) 100 MG TABS Take 100 mg by mouth daily as needed. Take one tablet at onset of headache, may repeat 1 tablet in 2 hours, no more than 2 tablets in 24 hours 5 tablet 0   No current facility-administered medications on file prior to visit.    Review of Systems:  As per HPI- otherwise negative.   Physical Examination: There were no vitals filed for this visit. There were no vitals filed for this visit. There is no height or weight on file to calculate BMI. Ideal Body Weight:    GEN: no acute distress. HEENT: Atraumatic, Normocephalic.  Ears and Nose: No external deformity. CV: RRR, No M/G/R. No JVD. No thrill. No extra heart sounds. PULM: CTA B, no wheezes, crackles, rhonchi. No retractions. No resp. distress. No accessory muscle use. ABD: S, NT, ND, +BS. No rebound. No HSM. EXTR: No c/c/e PSYCH: Normally interactive. Conversant.    Assessment and Plan: ***  Signed Lamar Blinks, MD

## 2020-12-12 DIAGNOSIS — R140 Abdominal distension (gaseous): Secondary | ICD-10-CM | POA: Diagnosis not present

## 2020-12-12 DIAGNOSIS — K5900 Constipation, unspecified: Secondary | ICD-10-CM | POA: Diagnosis not present

## 2020-12-12 DIAGNOSIS — K219 Gastro-esophageal reflux disease without esophagitis: Secondary | ICD-10-CM | POA: Diagnosis not present

## 2020-12-13 ENCOUNTER — Ambulatory Visit: Payer: Medicare Other | Admitting: Family Medicine

## 2020-12-18 DIAGNOSIS — I10 Essential (primary) hypertension: Secondary | ICD-10-CM

## 2020-12-18 DIAGNOSIS — E785 Hyperlipidemia, unspecified: Secondary | ICD-10-CM | POA: Diagnosis not present

## 2020-12-19 NOTE — Progress Notes (Signed)
Cardiology Office Note   Date:  12/20/2020   ID:  Stephanie Parks, Stephanie Parks 1945-05-29, MRN [id number]  PCP:  [name], MD    No chief complaint on file.    Wt Readings from Last 3 Encounters:  12/20/20 202 lb (91.6 kg)  11/15/20 196 lb 9.6 oz (89.2 kg)  11/15/20 195 lb (88.5 kg)       History of Present Illness: Stephanie Parks is a 75 y.o. female    has had HTN and hyperlipidemia.  She has a h/o brain aneurysm.  She had a cath many years ago and no PCI was needed.     She has had palpitations, monitor in 2018 showed: Normal sinus rhythm with occasional PACs and PVCs. Occasional sinus tachycardia noted. Patient's symptoms of fluttering did not correspond to an arrhythmia.   SHe has had atypical chest discomfort in the past.  She declined stress test as symptoms were mild and quite atypical including improving with exercise and belching.   Lovastatin was switched for Crestor in 2020.    12/2018 echo essentially normal at [facility].   Cath in 2020 at [facility]: "[place] and coronary angiogram via R CFA (accessed R radial but severe  tortuosity of R SCA and unable to wire into Ao so transitioned to femoral)  for chest pain (suspect the positive troponin was lab error).  LM engaged,  normal.  LAD normal, LCx normal.  RCA engaged, normal.  Angiogram of  innominate performed, no complication from attempted wiring via radial,  severe tortuosity confirmed; " LVEDP 20 mm Hg.  11/2019 monitor showed: "Normal sinus rhythm with rare PACs and PVCs, none of which correlated to symptoms. No pathologic arrhythmias. No atrial fibrillation."   Main stress is working at [employer]. She is frustrated that everything has been ok on several ER trips.    She has made several trips to the urgent care with negative w/us both at [place].  She continues to have chest pain. She reports left arm pain and left leg pain.  She noted that the pain is reduced with belching. She has an  appt with pulmonary.     Admits to getting panicked with seeing high BP readings.   In 04/2020: "We spoke about deep breathing and relaxation techniques as well.  I think anxiety plays a large role in her symptoms.  Will defer to PCP to see if SSRI would be helpful."  She needs a left hip replacement with Dr. Rush Farmer, but it won't be done until BP readings are more controlled.  176H-607 systolic at home.   Past Medical History:  Diagnosis Date   3-vessel CAD 03/01/2015   Achalasia 09/28/2012   Allergic rhinitis 03/01/2015   Anemia    Aneurysm ([place]) 03/01/2015   BP (high blood pressure) 03/01/2015   Bronchitis 04/23/2018   Cephalalgia 08/24/2012   Overview:  ICD-10 cut over     Chest pain 04/04/2011   CN (constipation) 09/28/2012   Coughing 04/24/2017   Decreased potassium in the blood 03/01/2015   Diverticulosis    Dizziness 03/01/2015   GERD (gastroesophageal reflux disease)    Hiatal hernia    Hypercholesterolemia 03/01/2015   Hyperlipidemia    Hypertension    Ingrown toenail 08/10/2017   Inguinal hernia    Migraine headache    Onychomycosis 12/08/2017   Paresthesia of arm 03/01/2015   Schatzki's ring    Tendonitis, Achilles, right 12/08/2017   Unilateral primary osteoarthritis, left knee  10/07/2017    Past Surgical History:  Procedure Laterality Date   ABDOMINAL HYSTERECTOMY     BACK SURGERY     x 3   CARPAL TUNNEL RELEASE     left wrist   CERVICAL SPINE SURGERY     HEMORRHOID SURGERY       Current Outpatient Medications  Medication Sig Dispense Refill   acetaminophen (TYLENOL) 325 MG tablet Take 650 mg by mouth. For headaches     Calcium Carb-Cholecalciferol (CALCIUM 600 + D PO) Take 1 tablet by mouth daily.     ELDERBERRY PO Take 50 mg by mouth daily. Also contains zinc and vitamin C     EPINEPHrine (EPIPEN 2-PAK) 0.3 mg/0.3 mL IJ SOAJ injection Inject 0.3 mLs (0.3 mg total) into the muscle as needed for anaphylaxis. Per allergen immunotherapy protocol 1 each 2    fluticasone (FLONASE) 50 MCG/ACT nasal spray USE 2 SPRAYS IN NOSTRIL(S) DAILY AS NEEDED.  In the right nostril point the applicator out toward your right ear.  In the left nostril point the applicator out toward her left ear (Patient taking differently: Place 2 sprays into both nostrils daily as needed for allergies.) 16 g 9   Galcanezumab-gnlm (EMGALITY) 120 MG/ML SOAJ Inject 120 mg into the skin every 30 (thirty) days. 1.12 mL 11   losartan (COZAAR) 100 MG tablet Take 1 tablet (100 mg total) by mouth daily. 90 tablet 1   METAMUCIL FIBER PO Take by mouth.     metoprolol succinate (TOPROL-XL) 25 MG 24 hr tablet Take 1 tablet (25 mg total) by mouth daily. 90 tablet 3   Multiple Vitamins-Minerals (MULTIVITAL) tablet Take 1 tablet by mouth daily.      nortriptyline (PAMELOR) 25 MG capsule Take 2 capsules (50 mg total) by mouth at bedtime. 180 capsule 3   omeprazole (PRILOSEC) 40 MG capsule Take 40 mg by mouth daily.     potassium chloride SA (KLOR-CON M20) 20 MEQ tablet TAKE 2 TABLETS BY MOUTH EVERY DAY 180 tablet 1   rosuvastatin (CRESTOR) 10 MG tablet TAKE 1 TABLET BY MOUTH EVERY DAY (Patient taking differently: Take 10 mg by mouth daily.) 90 tablet 3   Specialty Vitamins Products (MAGNESIUM, AMINO ACID CHELATE,) 133 MG tablet Take 1 tablet by mouth daily. Dose unknown     UBRELVY 100 MG TABS TAKE 1 TABLET BY MOUTH AS NEEDED. CAN TAKE 1 ADDITIONAL TABLET IN 2 HOURS IF NEEDED AFTER INITIAL DOSE. NO MORE THAN 2 TABLETS IN 24 HOURS. 10 tablet 5   Ubrogepant (UBRELVY) 100 MG TABS Take 100 mg by mouth daily as needed. Take one tablet at onset of headache, may repeat 1 tablet in 2 hours, no more than 2 tablets in 24 hours 5 tablet 0   No current facility-administered medications for this visit.    Allergies:   Shellfish allergy, Ace inhibitors, Gabapentin, Pitavastatin, Topiramate, and Sulfa antibiotics    Social History:  The patient  reports that she quit smoking about 36 years ago. Her smoking use  included cigarettes. She has a 10.00 pack-year smoking history. She has never used smokeless tobacco. She reports that she does not drink alcohol and does not use drugs.   Family History:  The patient's family history includes Allergic rhinitis in her sister; Diabetes in her sister; Heart attack (age of onset: 70) in her father; Heart disease in her father and mother; Hypertension in her sister; Stomach cancer in her maternal grandmother.    ROS:  Please see the history  of present illness.   Otherwise, review of systems are positive for palpitations with increased metoprolol.   All other systems are reviewed and negative.    PHYSICAL EXAM: VS:  BP (!) 160/84   Pulse 72   Ht 5\' 5"  (1.651 m)   Wt 202 lb (91.6 kg)   SpO2 100%   BMI 33.61 kg/m  , BMI Body mass index is 33.61 kg/m. GEN: Well nourished, well developed, in no acute distress HEENT: normal Neck: no JVD, carotid bruits, or masses Cardiac: RRR; no murmurs, rubs, or gallops, 1+ bilateral LE edema  Respiratory:  clear to auscultation bilaterally, normal work of breathing GI: soft, nontender, nondistended, + BS MS: no deformity or atrophy Skin: warm and dry, no rash Neuro:  Strength and sensation are intact Psych: euthymic mood, full affect, anxious   EKG:   The ekg ordered today demonstrates    Recent Labs: 03/08/2020: TSH 3.80 03/17/2020: B Natriuretic Peptide 75.1 03/21/2020: Magnesium 2.3 04/10/2020: Pro B Natriuretic peptide (BNP) 44.0 11/07/2020: ALT 14; BUN 16; Creatinine, Ser 0.86; Hemoglobin 10.9; Platelets 250; Potassium 3.5; Sodium 141   Lipid Panel    Component Value Date/Time   CHOL 161 06/02/2019 1439   CHOL 176 07/28/2018 0756   TRIG 98 03/12/2020 1503   HDL 56.00 06/02/2019 1439   HDL 73 07/28/2018 0756   CHOLHDL 3 06/02/2019 1439   VLDL 17.2 06/02/2019 1439   LDLCALC 87 06/02/2019 1439   LDLCALC 93 07/28/2018 0756     Other studies Reviewed: Additional studies/ records that were reviewed today with  results demonstrating: LDL 87 in 4/21.   ASSESSMENT AND PLAN:  Palpitations: Persistent.  Negative monitor in the past.  She decrease metoprolol dose on her own due to perceived palpitations. Chest /arm pain/preoperative cardiovascular eval: Stable. Negative troponin in 10/2020.  Given that she had a negative cath in 2020, no further cardiac testing needed before her hip surgery. HTN: Stop metoprolol due to her perceived side effects. Add amlodipine 5 mg daily.  Shoulder pain and hip pain may make BP worse.  Could also consider using carvedilol.  She has had several symptoms and perceived side effects that were thought to be due to anxiety.  Her primary care doctor told her not to check her blood pressure daily as this may also be a source of anxiety. Unable to have cath from right radial approach due to subclavian tortuosity.  Hyperlipidemia: Needs lipids checked. COntinue rosuvastatin.  Liver tests were checked in September 2022 and were normal. She had had LE edema for years per her report.  Would not stop amlodipine or mild swelling as this has occurred before we started the amlodipine today.    Current medicines are reviewed at length with the patient today.  The patient concerns regarding her medicines were addressed.  The following changes have been made:    Labs/ tests ordered today include:  No orders of the defined types were placed in this encounter.   Recommend 150 minutes/week of aerobic exercise Low fat, low carb, high fiber diet recommended  Disposition:   FU in 2 weeks with PharmD HTN clinic, 1 year f/u   Signed, Larae Grooms, MD  12/20/2020 9:36 AM    [facility] [address], [place], [place]  [address] Phone: [phone]; Fax: [phone]

## 2020-12-20 ENCOUNTER — Encounter: Payer: Self-pay | Admitting: Interventional Cardiology

## 2020-12-20 ENCOUNTER — Ambulatory Visit (INDEPENDENT_AMBULATORY_CARE_PROVIDER_SITE_OTHER): Payer: Medicare Other | Admitting: Interventional Cardiology

## 2020-12-20 VITALS — BP 160/84 | HR 72 | Ht 65.0 in | Wt 202.0 lb

## 2020-12-20 DIAGNOSIS — Z87898 Personal history of other specified conditions: Secondary | ICD-10-CM | POA: Diagnosis not present

## 2020-12-20 DIAGNOSIS — R002 Palpitations: Secondary | ICD-10-CM | POA: Diagnosis not present

## 2020-12-20 DIAGNOSIS — E782 Mixed hyperlipidemia: Secondary | ICD-10-CM

## 2020-12-20 DIAGNOSIS — I2510 Atherosclerotic heart disease of native coronary artery without angina pectoris: Secondary | ICD-10-CM

## 2020-12-20 DIAGNOSIS — Z01810 Encounter for preprocedural cardiovascular examination: Secondary | ICD-10-CM | POA: Diagnosis not present

## 2020-12-20 DIAGNOSIS — I10 Essential (primary) hypertension: Secondary | ICD-10-CM

## 2020-12-20 MED ORDER — AMLODIPINE BESYLATE 5 MG PO TABS
5.0000 mg | ORAL_TABLET | Freq: Every day | ORAL | 3 refills | Status: DC
Start: 2020-12-20 — End: 2021-01-10

## 2020-12-20 NOTE — Patient Instructions (Addendum)
Medication Instructions:  1.Stop metoprolol succinate (Toprol-XL) 2.Start amlodipine (Norvasc) 5 mg, take one tablet by mouth daily *If you need a refill on your cardiac medications before your next appointment, please call your pharmacy*   Lab Work: None If you have labs (blood work) drawn today and your tests are completely normal, you will receive your results only by: [place] (if you have MyChart) OR A paper copy in the mail If you have any lab test that is abnormal or we need to change your treatment, we will call you to review the results.   Follow-Up: At [facility], you and your health needs are our priority.  As part of our continuing mission to provide you with exceptional heart care, we have created designated Provider Care Teams.  These Care Teams include your primary Cardiologist (physician) and Advanced Practice Providers (APPs -  Physician Assistants and Nurse Practitioners) who all work together to provide you with the care you need, when you need it.    Your next appointment:   1 year(s)  The format for your next appointment:   In Person  Provider:   Casandra Doffing, MD   Other Instructions You have been referred to see our PharmD in the hypertension clinic here in our office in 2 weeks.

## 2020-12-21 ENCOUNTER — Ambulatory Visit (INDEPENDENT_AMBULATORY_CARE_PROVIDER_SITE_OTHER): Payer: Medicare Other | Admitting: Pharmacist

## 2020-12-21 DIAGNOSIS — K5900 Constipation, unspecified: Secondary | ICD-10-CM

## 2020-12-21 DIAGNOSIS — K219 Gastro-esophageal reflux disease without esophagitis: Secondary | ICD-10-CM

## 2020-12-21 DIAGNOSIS — I10 Essential (primary) hypertension: Secondary | ICD-10-CM

## 2020-12-24 NOTE — Patient Instructions (Addendum)
Migraine Headaches:  Goal: decrease frequency of headaches to less than 15 days per month Managed by neurology - Amy Lomax Current Therapy:  Nortriptyline 25mg  - take 2 capsules = 50mg  at bedtime Ubrelvy 100mg  - take 1 tablet as needed for headache. May take an additional tablet if needed in 2 hours. No more than 2 tablets in 24 hours Interventions:    Continue current therapy for migraine prevention and treatment Continue to follow up with neurology  Hypertension: Uncontrolled per last 3 office BP readings; Possibly related to pain / headaches.  BP goal <140/90  BP Readings from Last 3 Encounters:  12/20/20 (!) 160/84  11/15/20 (!) 152/80  11/15/20 (!) 151/73    Current treatment: Losartan 100mg  daily Amlodipine 5mg  daily Interventions:  Discussed blood pressure goals Continue to check blood pressure and heart rate twice per week, record and bring to future appointments Follow up with cardiologist as planned  Hyperlipidemia / CAD: No recent LDL but patient is taking moderate to high intensity statin. Current treatment: rosuvastatin 10mg  daily Interventions:  Continue current regimen Consider checking lipid panel with next labs  Chronic constipation:  Current treatment:  Linzess 30mcg daily Previous therapies: Amitiza 56mcg - patient could not remember why stopped Interventions:  [place], both Linzess and generic Amitiza are tier 3 or $47/month. Patient will enter Medicare coverage gap soon.  Completed patient portion of medication assistance program application for Linzess - will forward to either Dr Copland or Dr Oren Beckmann for review and signature and fax to program.   Osteopenia / low bone density:  Current Therapy:  Calcium + D - 500mg  daily Interventions:  Continue to take calcium + D daily - goal is to get 1200mg  of calcium daily from supplement and diet Vitamin D goal is to get 1000 IU daily Fall prevention Consider rechecking  DEXA  GERD / acid reflux:  Goal: Decrease symptoms of acid reflux Current therapy:  Omeprazole 40mg  daily Interventions:  Recommended if she has breakthrough symptoms she can try OTC Pepcid up to twice a day Monitor for symptoms of acid reflux and report to office if experience these symptoms:  A burning sensation in your chest (heartburn), usually after eating, which might be worse at night. Chest pain. Difficulty swallowing. Regurgitation of food or sour liquid. Sensation of a lump in your throat. Continue as planned to f/u with Dr Oren Beckmann / Gastroenterologist   Health Maintenance:  Reviewed vaccination history and discussed benefits of annual flu, COVID bivalent booster, Shingrix, pneumonia and Tdap vaccinations Patient declined annual flu and COVID bivalent booster Consider Tetanus, pneumonia and Shingrix vaccinations in 2023   Patient Goals/Self-Care Activities Over the next 90 days, patient will:  Take medications as prescribed,  Check blood pressure twice per week, document, and provide at future appointments,  Collaborate with provider on medication access solutions, and  Work with neurologist regarding migraine headaches   Patient verbalizes understanding of instructions provided today and agrees to view in [place].   Telephone follow up appointment with care management team member scheduled for: 2 to 4 months  Cherre Robins, PharmD Clinical Pharmacist [facility]

## 2020-12-24 NOTE — Chronic Care Management (AMB) (Signed)
Chronic Care Management Pharmacy Note  12/24/2020 Name:  Stephanie Parks MRN:  [id number] DOB:  [birth year]   Subjective: Stephanie Parks is an 75 y.o. year old female who is a primary patient of Copland, Gay Filler, MD.  The CCM team was consulted for assistance with disease management and care coordination needs.    Engaged with patient by telephone for follow up visit in response to provider referral for pharmacy case management and/or care coordination services.   Consent to Services:  The patient was given information about Chronic Care Management services, agreed to services, and gave verbal consent prior to initiation of services.  Please see initial visit note for detailed documentation.   Patient Care Team: Copland, Gay Filler, MD as PCP - General (Family Medicine) Jettie Booze, MD as PCP - Cardiology (Cardiology) Ashok Pall, MD as Consulting Physician (Neurosurgery) Luretha Rued, RN as Case Manager Cherre Robins, RPH-CPP (Pharmacist)  Recent office visits: 11/15/2020 - Fam Med (Dr Lorelei Pont) F/U HTN. Increased metoprolol to 75m daily.  11/01/2020 - PCP (Dr CLorelei Pont F/U fluctuating BP, palpitations and chest discomfort. Felt that chest pain was musculoskeletal but checked EKG, D-Dimer, troponin and BMP. D-DImer was still high - ordered CT angiogram which was negative for blood clot / PE. Noted to have low potassium despite taking 456m daily 10/03/2020 - PCP (Dr [name] presented for acute visit with CP. [facility] but patient presented to PCP office instead. Noted CP has recolved. Labs checked. Changed omeprazole to Dexilant 6033maily. Add pepcid.  08/31/2020 - PCP (Dr CopLorelei Ponteen for anxiety and right shoulder pain; ordered mammogram; referred to ortho for shoulder pain  Recent consult visits: 12/20/2020 - Cardio (dr VarIrish Lackeen for preop cardio evaluation. Noted elevated BP. Metoprolol  stopped. Started amlodipine 5mg79mily  12/12/2020 - GI (Dr HonoOren Beckmanntrium /[facility]) Seen for bloating and constipation. Prescribed Linzess 72mg28mntinue fiber supplement (Benefiber or Miralax) 11/22/2020 - Ortho Surgery (Dr BlackNinfa Lindenn of OA of hip. Wil scheduled left total hip arthoplasty once cleared by cardiologist.  11/15/2020 - Neuro (LomaUbaldo Glassing Seen for migraine HA. Restarted nortriptyline 25mg 59might for 1 week, then increase to 50mg a34mght. Was given #5 Ubrelvy 100mg sa48ms 11/02/2020 - Ortho Surgery (Dr WhitfielDurward Fortesd Left hip MRI with patient. Referred to Dr BlackmanNinfa Lindenuss left hip replacement 10/24/2020 - Ortho Surger - Phone call. Patient requesting stonger pain medication. Recommended MRI and possible hip replacement.  10/18/2020 - Orthopedic surgery (Dr WhitfielDurward Fortesor OA and pain of left hip. Received injection in left hip of lidocaine, methyprednisolone and bupivacaine.  10/05/2020 - Allergy and [facility]. Recieved immunotherpay  10/05/2020 - GI (Dr Honor) POren BeckmannMessage. Patient requested PPI sent to pharmacy . Omeprazole was sent. 09/27/2020 - Ortho (Dr Xu) LeftErlinda Hongip steriod injection. 09/15/2020 - GI Phone Call - continued bloating and swallowing difficulty. Dr Honor noOren Beckmannothing significant to explain symptoms on EGD and manometry from 09/01/20. Recommended H.Pylori Breath test. Patient agreed with proceeding with HBT. 09/07/2020 - Immunotherapy with allergiest office 09/01/2020 - GI - esophageal manometry performed  Hospital visits: 11/07/2020 - [facility] Seen for headache, dizziness and HTN. SBP at home was 160. BP in ED was 159/82 and HR 102. EKG and CT head ordered. GIven hydralazine in ED. Lab and head CT unremarkable. BP decreased. No med changes noted. 10/03/2020 - Urgent Care -  [facility] @ [place]. Seen for CP. EKG - showed bradycardia but otherwise normal. Suspected reflux. Sent to ED since patient wanted troponin checked.  However instead patient made appt with PCP office.   Objective:  Lab Results  Component Value Date   CREATININE 0.86 11/07/2020   CREATININE 0.82 11/01/2020   CREATININE 0.91 10/03/2020    Lab Results  Component Value Date   HGBA1C 5.9 06/02/2019   Last diabetic Eye exam: No results found for: HMDIABEYEEXA  Last diabetic Foot exam: No results found for: HMDIABFOOTEX      Component Value Date/Time   CHOL 161 06/02/2019 1439   CHOL 176 07/28/2018 0756   TRIG 98 03/12/2020 1503   HDL 56.00 06/02/2019 1439   HDL 73 07/28/2018 0756   CHOLHDL 3 06/02/2019 1439   VLDL 17.2 06/02/2019 1439   LDLCALC 87 06/02/2019 1439   LDLCALC 93 07/28/2018 0756    Hepatic Function Latest Ref Rng & Units 11/07/2020 10/03/2020 07/24/2020  Total Protein 6.5 - 8.1 g/dL 6.9 6.8 6.6  Albumin 3.5 - 5.0 g/dL 3.4(L) 3.7 3.3(L)  AST 15 - 41 U/L _0 ALT 0 - 44 U/L _1 Alk Phosphatase 38 - 126 U/L 63 70 59  Total Bilirubin 0.3 - 1.2 mg/dL 0.3 0.3 0.4  Bilirubin, Direct 0.00 - 0.40 mg/dL - - -    Lab Results  Component Value Date/Time   TSH 3.80 03/08/2020 04:31 PM   TSH 2.64 11/10/2019 02:36 PM    CBC Latest Ref Rng & Units 11/07/2020 10/03/2020 07/24/2020  WBC 4.0 - 10.5 K/uL 6.6 10.4 -  Hemoglobin 12.0 - 15.0 g/dL 10.9(L) 11.1(L) 11.6(L)  Hematocrit 36.0 - 46.0 % 33.3(L) 33.9(L) 34.0(L)  Platelets 150 - 400 K/uL 250 326.0 -    Lab Results  Component Value Date/Time   VD25OH 36.70 09/26/2016 10:59 AM    Clinical ASCVD: Yes  The ASCVD Risk score (Arnett DK, et al., 2019) failed to calculate for the following reasons:   The patient has a prior MI or stroke diagnosis     Social History   Tobacco Use  Smoking Status Former   Packs/day: 0.50   Years: 20.00   Pack years: 10.00   Types: Cigarettes   Quit date: 02/19/1984   Years since quitting: 36.8  Smokeless Tobacco Never   BP Readings from Last 3 Encounters:  12/20/20 (!) 160/84  11/15/20 (!) 152/80  11/15/20 (!) 151/73    Pulse Readings from Last 3 Encounters:  12/20/20 72  11/15/20 68  11/15/20 77   Wt Readings from Last 3 Encounters:  12/20/20 202 lb (91.6 kg)  11/15/20 196 lb 9.6 oz (89.2 kg)  11/15/20 195 lb (88.5 kg)    Assessment: Review of patient past medical history, allergies, medications, health status, including review of consultants reports, laboratory and other test data, was performed as part of comprehensive evaluation and provision of chronic care management services.   SDOH:  (Social Determinants of Health) assessments and interventions performed:  SDOH Interventions    Flowsheet Row Most Recent Value  SDOH Interventions   Financial Strain Interventions Other (Comment)  [plan to apply for MAP - Linzess]        CCM Care Plan  Allergies  Allergen Reactions   Shellfish Allergy Hives and Swelling   Ace Inhibitors Other (See Comments)    Pt cannot recall her reaction but tolerates arb    Gabapentin Other (See Comments)    headaches  Pitavastatin     Unknown reaction    Topiramate     Heart race,    Sulfa Antibiotics Other (See Comments) and Rash    Fine bumps    Medications Reviewed Today     Reviewed by Cherre Robins, RPH-CPP (Pharmacist) on 12/21/20 at 1539  Med List Status: <None>   Medication Order Taking? Sig Documenting Provider Last Dose Status Informant  acetaminophen (TYLENOL) 325 MG tablet [id number] Yes Take 650 mg by mouth. For headaches [provider] Taking Active Self  amLODipine (NORVASC) 5 MG tablet [id number] Yes Take 1 tablet (5 mg total) by mouth daily. Jettie Booze, MD Taking Active   Calcium Carb-Cholecalciferol (CALCIUM 600 + D PO) [id number] Yes Take 1 tablet by mouth daily. [provider] Taking Active Self  ELDERBERRY PO [id number] Yes Take 50 mg by mouth daily. Also contains zinc and vitamin C [provider] Taking Active Self  EPINEPHrine (EPIPEN 2-PAK) 0.3 mg/0.3 mL IJ SOAJ injection [id number] Yes Inject  0.3 mLs (0.3 mg total) into the muscle as needed for anaphylaxis. Per allergen immunotherapy protocol Ambs, Kathrine Cords, FNP Taking Active Self           Med Note Vita Barley Mar 13, 2020 12:35 PM)    fluticasone (FLONASE) 50 MCG/ACT nasal spray [id number] Yes USE 2 SPRAYS IN NOSTRIL(S) DAILY AS NEEDED.  In the right nostril point the applicator out toward your right ear.  In the left nostril point the applicator out toward her left ear  Patient taking differently: Place 2 sprays into both nostrils daily as needed for allergies.   Dara Hoyer, FNP Taking Active            Med Note Vita Barley Mar 13, 2020 12:32 PM)    Donah Driver [facility]) 120 MG/ML SOAJ [id number] No Inject 120 mg into the skin every 30 (thirty) days.  Patient not taking: Reported on 12/21/2020   Melvenia Beam, MD Not Taking Active   losartan (COZAAR) 100 MG tablet [id number] Yes Take 1 tablet (100 mg total) by mouth daily. Jettie Booze, MD Taking Active   METAMUCIL FIBER PO [id number] Yes Take by mouth. [provider] Taking Active            Med Note Juleen China, Jodi Marble Sep 14, 2020  2:47 PM) Reports takes every other day (powder form)  Multiple Vitamins-Minerals (MULTIVITAL) tablet [id number] Yes Take 1 tablet by mouth daily.  [provider] Taking Active Self           Med Note Pollyann Kennedy Aug 17, 2020 11:19 AM) Alive vit  nortriptyline (PAMELOR) 25 MG capsule [id number] Yes Take 2 capsules (50 mg total) by mouth at bedtime. Lomax, Amy, NP Taking Active   omeprazole (PRILOSEC) 40 MG capsule [id number] Yes Take 40 mg by mouth daily. [provider] Taking Active   potassium chloride SA (KLOR-CON M20) 20 MEQ tablet [id number] Yes TAKE 2 TABLETS BY MOUTH EVERY DAY Copland, Gay Filler, MD Taking Active            Med Note Luretha Rued   Tue Nov 28, 2020  2:34 PM) Reports she is taking every other day.  rosuvastatin (CRESTOR) 10 MG tablet [id number]  Yes TAKE 1 TABLET BY MOUTH EVERY DAY Copland, Gay Filler, MD Taking Active            Med Note (SUTPHIN, CHASIE  Wanda Plump   Mon Mar 13, 2020 12:35 PM)    Specialty Vitamins Products (MAGNESIUM, AMINO ACID CHELATE,) 133 MG tablet [id number] Yes Take 1 tablet by mouth daily. Dose unknown [provider] Taking Active   UBRELVY 100 MG TABS [id number] Yes TAKE 1 TABLET BY MOUTH AS NEEDED. CAN TAKE 1 ADDITIONAL TABLET IN 2 HOURS IF NEEDED AFTER INITIAL DOSE. NO MORE THAN 2 TABLETS IN 24 HOURS. Melvenia Beam, MD Taking Active   Ubrogepant [facility]) 100 MG TABS [id number] Yes Take 100 mg by mouth daily as needed. Take one tablet at onset of headache, may repeat 1 tablet in 2 hours, no more than 2 tablets in 24 hours Lomax, Amy, NP Taking Active   Med List Note (Copland, Gay Filler, MD 04/06/20 1050):              Patient Active Problem List   Diagnosis Date Noted   Unilateral primary osteoarthritis, left hip 10/18/2020   Greater trochanteric bursitis of left hip 10/18/2020   Headache 06/07/2020   Acute respiratory failure with hypoxia ([place]) 05/02/2020   Post-COVID chronic dyspnea 05/02/2020   Pulsatile neck mass 04/26/2020   Pneumonia due to COVID-19 virus 03/12/2020   Headache disorder 01/04/2020   Seasonal allergic conjunctivitis 10/04/2019   Dysfunction of right eustachian tube 10/04/2019   Pre-diabetes 06/02/2019   Seasonal and perennial allergic rhinitis 12/22/2018   Heart palpitations 12/22/2018   History of chest pain 07/20/2018   Brain aneurysm 07/20/2018   Dyslipidemia 05/04/2018   Bronchitis 04/23/2018   Tendonitis, Achilles, right 12/08/2017   Onychomycosis 12/08/2017   Unilateral primary osteoarthritis, left knee 10/07/2017   Ingrown toenail 08/10/2017   Coughing 04/24/2017   CAD (coronary artery disease) 03/01/2015   Aneurysm ([place]) 03/01/2015   Dizziness 03/01/2015   Paresthesia of arm 03/01/2015   Seasonal allergic rhinitis 03/01/2015   Achalasia 09/28/2012   CN  (constipation) 09/28/2012   Cephalalgia 08/24/2012   Essential hypertension 04/19/2011   Hyperlipidemia 04/19/2011   GERD (gastroesophageal reflux disease) 04/19/2011   Chest pain 04/04/2011    Immunization History  Administered Date(s) Administered   PFIZER(Purple Top)SARS-COV-2 Vaccination 06/07/2019, 06/30/2019    Conditions to be addressed/monitored: CAD, HTN, HLD, Anxiety, and migraine headache; achalasia; GERD; OA; pre DM   Care Plan : General Pharmacy (Adult)  Updates made by Cherre Robins, RPH-CPP since 12/24/2020 12:00 AM     Problem: Chronic Headaches, Aneurysm; CAD; HDL; HTN; bronchitis; achalasia; GERD; anxiety; chronic constipation; OA   Priority: High  Onset Date: 09/28/2020     Long-Range Goal: Medication and chronic conditions management   Start Date: 09/28/2020  Priority: High  Note:   Current Barriers:  Unable to independently afford treatment regimen Unable to achieve control of migraine headaches or HTN   Pharmacist Clinical Goal(s):  Over the next 90 days, patient will verbalize ability to afford treatment regimen achieve adherence to monitoring guidelines and medication adherence to achieve therapeutic efficacy achieve control of migraine headaches as evidenced by decreasing frequency of migraine headaches maintain control of HTN as evidenced by BP <140/90  through collaboration with PharmD and provider.   Interventions: 1:1 collaboration with Copland, Gay Filler, MD regarding development and update of comprehensive plan of care as evidenced by provider attestation and co-signature Inter-disciplinary care team collaboration (see longitudinal plan of care) Comprehensive medication review performed; medication list updated in electronic medical record  Migraine Headaches:  Improved since starting nortriptyline  Has only needed rescue / acute medication, Ubrelvy once since started nortryptyline  Goal: decrease frequency of headaches to less than 10 days  per month Managed by neurology - Amy Lomax Current Therapy:  Emgality - on med list but not taking currently due to cost Ubrelvy 127m - take 1 tablet as needed for headache. May take an additional tablet if needed in 2 hours. No more than 2 tablets in 24 hours Nortriptyline 560mat bedtime Previous therapies: topiramate, nortriptyline, gabapentin, Ajovy (08/17/2020 - not covered by insurance so changed to [employer] cost and questionable efficacy Patient reports she had migraines several years ago but had been rare until May 2022. She feels like return of migraine headaches is linked to COVID infection from 03/2020 10/2020: She has not seen any improvement in headache frequency since starting Emgality.  She is also concerned with cost of Emgality - $100 per month 10/2020: Patient reported that UbRoselyn Meierelps for a few hours but headache returns quickly and she is unable to take every day. 10/2020: At last visit reviewed patient's formulary / called insurance - [facility] patient assistance applications Ajovy, Vyepti, Nurtec and QuLenoria Chimere all non formulary (but called insurance and could ask for coverage determination if she has tried but failed other formulary medications Emgality and Aimovig are tier 4 - $100 / 30 days I did review other possible ways to help with cost of these medications Looked to see if [employer] open however it is currently closed. If patient is to stay on Emgality, could apply for patient assistance through the [employer] is assistance available for Nurtec. Application has been sent but patient reports she has not completed 12/21/2020: Headache frequency has decreased significantly per patient since restarting nortriptyline. She states UbRoselyn Meiers expensive by has only needed to use once recently and has 2 more samples if needed.There is not currently a medication assistance program for Ubrelvy   Interventions:  Continue current therapy for headache prevention and treatment Continue to follow up with neurology  Hypertension: Uncontrolled per last 3 office BP readings; Possibly related to pain / headaches.   Patient saw cardiologist 12/20/20 - metoprolol stopped and amlodipine started BP goal <140/90  BP Readings from Last 3 Encounters:  12/20/20 (!) 160/84  11/15/20 (!) 152/80  11/15/20 (!) 151/73  Current treatment: Losartan 10065maily Amlodipine 5mg52mily (stated 12/20/2020) Current home readings: Hasn't checked in last few days - recommended by PCP and cardiologist to only check twice per week due to variable results causing aniety Believe pain from hip and headaches could have effect on her variable blood pressure  Interventions:  Discussed blood pressure goals Recommend she only check blood pressure and heart rate twice per week , record and bring to future appointments Follow up with cardiology office as planned 12/2020  Hyperlipidemia / CAD: No recent LDL but patient is on moderate to high intensity statin with CAD.  Current treatment: Rosuvastatin 10mg48mly Interventions:  Continue current regimen Consider checking lipid panel with next labs  Chronic constipation:  Controlled Current treatment:  Linzess 72mcg6mly Previous therapies: Amitiza 8mcg -56mtient could not remember why stopped Interventions:  RevieweStonewallLinzess and generic Amitiza are tier 3 or $47/month. Patient will enter Medicare coverage gap soon.  Mailed application for Linzess patient assistance at last visit but patient has not completed yet.  Osteopenia:  Last DEXA 09/12/2016 T-Score for Left femur nek = -1.2 T-Score for AP Spine = + 1.3 FRAX = 3.9% for osteoporotic fracture /  0.5% hip fracture Current Therapy:  Calcium + D - 563m daily Previous therapy: alendronate Interventions:  Continue to take calcium + D daily - goal is to get 12076mof  calcium daily from supplement and diet Vitamin D goal is to get 1000 IU daily Fall prevention Consider recheck DEXA  GERD constipation :  Improved Saw gastroenterologist - Dr HoOren Beckmannurrent therapy:  Omeprazole 4045maily Miralax or Benefiber daily Linzess 1m22maily (not taking due to cost)  Dr LownEtter Sjogrenommended she change omeprazole to Dexilant and add Pepcid up to bid as needed.  Patient never tried either Dexilant or Pepcid. She states her pharmacy told her she should not take both omeprazole and Dexilant.  Denies reflux symptoms or chest pain since 10/03/2020 Interventions: Explained that she was to try Dexilant in place of omeprazole since she was having reflux symptoms. Patient reports she would like to stick with omeprazole. Med list updated.  Recommended if she has breakthrough symptoms she can try OTC Pepcid up to twice a day  Patient has returned paperwork for patient assistance for Linzess. Will complete provider portion and forward to either Dr HonoOren BeckmannDr CoplLorelei Pont Health Maintenance:  Reviewed vaccination history and discussed benefits of annual flu, COVID bivalent booster, Shingrix, pneumonia and Tdap vaccinations Patient declined annual flu and COVID bivalent booster Considering Tetanus, pneumonia and Shingrix but would like to delay until 2023.   Patient Goals/Self-Care Activities Over the next 90 days, patient will:  take medications as prescribed, check blood pressure daily, document, and provide at future appointments, collaborate with provider on medication access solutions, and work with neurologist regarding migraine headaches   Follow Up Plan: Telephone follow up appointment with care management team member scheduled for:  1 month          Medication Assistance: Application for Linzess  medication assistance program. in process.  Anticipated assistance start date 01/18/2021.  See plan of care for additional detail.  Patient's [facility]  [facility]  [id number] [place] [facility] [id number]: [phone] Up:  Patient agrees to Care Plan and Follow-up.  Plan: Telephone follow up appointment with care management team member scheduled for:  2 to 4 weeks  TammyCherre RobinsrmD Clinical Pharmacist [facility]

## 2020-12-27 ENCOUNTER — Ambulatory Visit: Payer: Medicare Other | Admitting: Orthopaedic Surgery

## 2020-12-28 ENCOUNTER — Telehealth: Payer: Self-pay

## 2020-12-28 ENCOUNTER — Telehealth: Payer: Medicare Other

## 2020-12-28 NOTE — Telephone Encounter (Signed)
  Care Management   Follow Up Note   12/28/2020 Name: Stephanie Parks MRN: [id number] DOB: [birth year]   Referred by: [name], MD Reason for referral : Chronic Care Management (RNCM follow up)   An unsuccessful telephone outreach was attempted today. The patient was referred to the case management team for assistance with care management and care coordination.   Follow Up Plan: The care management team will reach out to the patient again over the next 30 days.    Thea Silversmith, RN, MSN, BSN, CCM Care Management Coordinator [facility] [phone]

## 2020-12-29 ENCOUNTER — Telehealth: Payer: Self-pay | Admitting: Pharmacist

## 2020-12-29 NOTE — Telephone Encounter (Signed)
Patient is asking if our office received paperwork for COVID relief fund that she is applying for assistance. I couldn't see anything on file that looked like it would be what patient was looking for. Had Trixie Rude also review records and she did not see any documentation of forms coming in or being dropped off. Will forward to PCP / CMA to see if they remember any paperwork regarding [place] coming in.

## 2020-12-29 NOTE — Telephone Encounter (Signed)
Opened in error

## 2020-12-31 ENCOUNTER — Emergency Department (HOSPITAL_BASED_OUTPATIENT_CLINIC_OR_DEPARTMENT_OTHER): Payer: Medicare Other

## 2020-12-31 ENCOUNTER — Encounter (HOSPITAL_BASED_OUTPATIENT_CLINIC_OR_DEPARTMENT_OTHER): Payer: Self-pay | Admitting: Emergency Medicine

## 2020-12-31 ENCOUNTER — Other Ambulatory Visit: Payer: Self-pay

## 2020-12-31 ENCOUNTER — Emergency Department (HOSPITAL_BASED_OUTPATIENT_CLINIC_OR_DEPARTMENT_OTHER)
Admission: EM | Admit: 2020-12-31 | Discharge: 2020-12-31 | Disposition: A | Discharge status: Home / Self Care | Payer: Medicare Other | Attending: Emergency Medicine | Admitting: Emergency Medicine

## 2020-12-31 DIAGNOSIS — R61 Generalized hyperhidrosis: Secondary | ICD-10-CM | POA: Diagnosis not present

## 2020-12-31 DIAGNOSIS — R600 Localized edema: Secondary | ICD-10-CM | POA: Diagnosis not present

## 2020-12-31 DIAGNOSIS — I700 Atherosclerosis of aorta: Secondary | ICD-10-CM | POA: Diagnosis not present

## 2020-12-31 DIAGNOSIS — I10 Essential (primary) hypertension: Secondary | ICD-10-CM | POA: Insufficient documentation

## 2020-12-31 DIAGNOSIS — Z8616 Personal history of COVID-19: Secondary | ICD-10-CM | POA: Diagnosis not present

## 2020-12-31 DIAGNOSIS — Z79899 Other long term (current) drug therapy: Secondary | ICD-10-CM | POA: Insufficient documentation

## 2020-12-31 DIAGNOSIS — Z87891 Personal history of nicotine dependence: Secondary | ICD-10-CM | POA: Diagnosis not present

## 2020-12-31 DIAGNOSIS — R072 Precordial pain: Secondary | ICD-10-CM | POA: Insufficient documentation

## 2020-12-31 DIAGNOSIS — I2510 Atherosclerotic heart disease of native coronary artery without angina pectoris: Secondary | ICD-10-CM | POA: Insufficient documentation

## 2020-12-31 DIAGNOSIS — R079 Chest pain, unspecified: Secondary | ICD-10-CM | POA: Diagnosis not present

## 2020-12-31 LAB — BASIC METABOLIC PANEL
Anion gap: 12 (ref 5–15)
BUN: 14 mg/dL (ref 8–23)
CO2: 24 mmol/L (ref 22–32)
Calcium: 9.3 mg/dL (ref 8.9–10.3)
Chloride: 103 mmol/L (ref 98–111)
Creatinine, Ser: 0.86 mg/dL (ref 0.44–1.00)
GFR, Estimated: >60 mL/min (ref >60)
Glucose, Bld: 92 mg/dL (ref 70–99)
Potassium: 3.4 mmol/L — ABNORMAL LOW (ref 3.5–5.1)
Sodium: 139 mmol/L (ref 135–145)

## 2020-12-31 LAB — CBC
HCT: 36.3 % (ref 36.0–46.0)
Hemoglobin: 11.7 g/dL — ABNORMAL LOW (ref 12.0–15.0)
MCH: 28.5 pg (ref 26.0–34.0)
MCHC: 32.2 g/dL (ref 30.0–36.0)
MCV: 88.5 fL (ref 80.0–100.0)
Platelets: 303 10*3/uL (ref 150–400)
RBC: 4.1 MIL/uL (ref 3.87–5.11)
RDW: 14.7 % (ref 11.5–15.5)
WBC: 7.7 10*3/uL (ref 4.0–10.5)
nRBC: 0 % (ref 0.0–0.2)

## 2020-12-31 LAB — TROPONIN I (HIGH SENSITIVITY)
Troponin I (High Sensitivity): 3 ng/L (ref <18)
Troponin I (High Sensitivity): 4 ng/L (ref <18)

## 2020-12-31 MED ORDER — ASPIRIN 325 MG PO TABS
325.0000 mg | ORAL_TABLET | Freq: Once | ORAL | Status: AC
  Administered 2020-12-31: 325 mg via ORAL
  Filled 2020-12-31: qty 1

## 2020-12-31 MED ORDER — CARVEDILOL 6.25 MG PO TABS: 6.2500 mg | ORAL_TABLET | Freq: Two times a day (BID) | ORAL | 0 refills | Status: DC

## 2020-12-31 NOTE — ED Provider Notes (Signed)
[facility] EMERGENCY DEPARTMENT Provider Note   CSN: [id number] Arrival date & time: 12/31/20  1611     History Chief Complaint  Patient presents with   Chest Pain    Stephanie Parks is a 75 y.o. female hx of CAD with no stents, HTN, HL, here with chest pain.  Patient has acute onset of substernal chest pain radiates to her back that is associated with diaphoresis around 10 AM.  She states that she is hanging curtains at that time.  She states that it lasted several seconds.  Patient states that she is pain-free now she actually saw cardiology about a week ago and her last catheterization was about 2 years ago.  She did not have any heart stents at that time.  She states that she occasionally has some palpitations.  She also saw cardiology because her blood pressure was elevated and that prevented her from getting hip surgery.  She states that she was started on Norvasc a week ago and her legs are swollen  The history is provided by the patient.      Past Medical History:  Diagnosis Date   3-vessel CAD 03/01/2015   Achalasia 09/28/2012   Allergic rhinitis 03/01/2015   Anemia    Aneurysm ([place]) 03/01/2015   BP (high blood pressure) 03/01/2015   Bronchitis 04/23/2018   Cephalalgia 08/24/2012   Overview:  ICD-10 cut over     Chest pain 04/04/2011   CN (constipation) 09/28/2012   Coughing 04/24/2017   Decreased potassium in the blood 03/01/2015   Diverticulosis    Dizziness 03/01/2015   GERD (gastroesophageal reflux disease)    Hiatal hernia    Hypercholesterolemia 03/01/2015   Hyperlipidemia    Hypertension    Ingrown toenail 08/10/2017   Inguinal hernia    Migraine headache    Onychomycosis 12/08/2017   Paresthesia of arm 03/01/2015   Schatzki's ring    Tendonitis, Achilles, right 12/08/2017   Unilateral primary osteoarthritis, left knee 10/07/2017    Patient Active Problem List   Diagnosis Date Noted   Unilateral primary osteoarthritis, left hip 10/18/2020   Greater  trochanteric bursitis of left hip 10/18/2020   Headache 06/07/2020   Acute respiratory failure with hypoxia ([place]) 05/02/2020   Post-COVID chronic dyspnea 05/02/2020   Pulsatile neck mass 04/26/2020   Pneumonia due to COVID-19 virus 03/12/2020   Headache disorder 01/04/2020   Seasonal allergic conjunctivitis 10/04/2019   Dysfunction of right eustachian tube 10/04/2019   Pre-diabetes 06/02/2019   Seasonal and perennial allergic rhinitis 12/22/2018   Heart palpitations 12/22/2018   History of chest pain 07/20/2018   Brain aneurysm 07/20/2018   Dyslipidemia 05/04/2018   Bronchitis 04/23/2018   Tendonitis, Achilles, right 12/08/2017   Onychomycosis 12/08/2017   Unilateral primary osteoarthritis, left knee 10/07/2017   Ingrown toenail 08/10/2017   Coughing 04/24/2017   CAD (coronary artery disease) 03/01/2015   Aneurysm ([place]) 03/01/2015   Dizziness 03/01/2015   Paresthesia of arm 03/01/2015   Seasonal allergic rhinitis 03/01/2015   Achalasia 09/28/2012   CN (constipation) 09/28/2012   Cephalalgia 08/24/2012   Essential hypertension 04/19/2011   Hyperlipidemia 04/19/2011   GERD (gastroesophageal reflux disease) 04/19/2011   Chest pain 04/04/2011    Past Surgical History:  Procedure Laterality Date   ABDOMINAL HYSTERECTOMY     BACK SURGERY     x 3   CARPAL TUNNEL RELEASE     left wrist   CERVICAL SPINE SURGERY     HEMORRHOID SURGERY  OB History   No obstetric history on file.     Family History  Problem Relation Age of Onset   Allergic rhinitis Sister    Diabetes Sister    Hypertension Sister    Heart attack Father 38   Heart disease Father    Stomach cancer Maternal Grandmother    Heart disease Mother    Colon cancer Neg Hx    Angioedema Neg Hx    Asthma Neg Hx    Eczema Neg Hx    Urticaria Neg Hx    Immunodeficiency Neg Hx    Breast cancer Neg Hx    Migraines Neg Hx    Headache Neg Hx     Social History   Tobacco Use   Smoking status: Former     Packs/day: 0.50    Years: 20.00    Pack years: 10.00    Types: Cigarettes    Quit date: 02/19/1984    Years since quitting: 36.8   Smokeless tobacco: Never  Vaping Use   Vaping Use: Never used  Substance Use Topics   Alcohol use: No   Drug use: No    Home Medications Prior to Admission medications   Medication Sig Start Date End Date Taking? Authorizing Provider  acetaminophen (TYLENOL) 325 MG tablet Take 650 mg by mouth. For headaches    [provider]  amLODipine (NORVASC) 5 MG tablet Take 1 tablet (5 mg total) by mouth daily. 12/20/20   Jettie Booze, MD  Calcium Carb-Cholecalciferol (CALCIUM 600 + D PO) Take 1 tablet by mouth daily.    [provider]  ELDERBERRY PO Take 50 mg by mouth daily. Also contains zinc and vitamin C    [provider]  EPINEPHrine (EPIPEN 2-PAK) 0.3 mg/0.3 mL IJ SOAJ injection Inject 0.3 mLs (0.3 mg total) into the muscle as needed for anaphylaxis. Per allergen immunotherapy protocol 10/04/19   Ambs, Kathrine Cords, FNP  fluticasone (FLONASE) 50 MCG/ACT nasal spray USE 2 SPRAYS IN NOSTRIL(S) DAILY AS NEEDED.  In the right nostril point the applicator out toward your right ear.  In the left nostril point the applicator out toward her left ear Patient taking differently: Place 2 sprays into both nostrils daily as needed for allergies. 10/04/19   Ambs, Kathrine Cords, FNP  Galcanezumab-gnlm (EMGALITY) 120 MG/ML SOAJ Inject 120 mg into the skin every 30 (thirty) days. Patient not taking: Reported on 12/21/2020 08/29/20   Melvenia Beam, MD  linaclotide [facility]) 72 MCG capsule Take 72 mcg by mouth daily before breakfast.    [provider]  losartan (COZAAR) 100 MG tablet Take 1 tablet (100 mg total) by mouth daily. 06/21/20   Jettie Booze, MD  METAMUCIL FIBER PO Take by mouth.    [provider]  Multiple Vitamins-Minerals (MULTIVITAL) tablet Take 1 tablet by mouth daily.     [provider]  nortriptyline  (PAMELOR) 25 MG capsule Take 2 capsules (50 mg total) by mouth at bedtime. 11/15/20   Lomax, Amy, NP  omeprazole (PRILOSEC) 40 MG capsule Take 40 mg by mouth daily. 10/05/20   [provider]  potassium chloride SA (KLOR-CON M20) 20 MEQ tablet TAKE 2 TABLETS BY MOUTH EVERY DAY 11/01/20   Copland, Gay Filler, MD  rosuvastatin (CRESTOR) 10 MG tablet TAKE 1 TABLET BY MOUTH EVERY DAY 01/11/20   Copland, Gay Filler, MD  Specialty Vitamins Products (MAGNESIUM, AMINO ACID CHELATE,) 133 MG tablet Take 1 tablet by mouth daily. Dose unknown  [provider]  UBRELVY 100 MG TABS TAKE 1 TABLET BY MOUTH AS NEEDED. CAN TAKE 1 ADDITIONAL TABLET IN 2 HOURS IF NEEDED AFTER INITIAL DOSE. NO MORE THAN 2 TABLETS IN 24 HOURS. 08/28/20   Melvenia Beam, MD  Ubrogepant (UBRELVY) 100 MG TABS Take 100 mg by mouth daily as needed. Take one tablet at onset of headache, may repeat 1 tablet in 2 hours, no more than 2 tablets in 24 hours 11/15/20   Lomax, Amy, NP    Allergies    Shellfish allergy, Ace inhibitors, Gabapentin, Pitavastatin, Topiramate, and Sulfa antibiotics  Review of Systems   Review of Systems  Cardiovascular:  Positive for chest pain.  All other systems reviewed and are negative.  Physical Exam Updated Vital Signs BP (!) 167/91   Pulse 91   Temp 98 F (36.7 C) (Oral)   Resp 18   Ht 5\' 5"  (1.651 m)   Wt 90.3 kg   SpO2 99%   BMI 33.12 kg/m   Physical Exam Vitals and nursing note reviewed.  Constitutional:      Comments: Slightly anxious   HENT:     Head: Normocephalic.  Eyes:     Pupils: Pupils are equal, round, and reactive to light.  Cardiovascular:     Rate and Rhythm: Normal rate and regular rhythm.     Heart sounds: Normal heart sounds.  Pulmonary:     Effort: Pulmonary effort is normal.     Breath sounds: Normal breath sounds.  Abdominal:     General: Bowel sounds are normal.     Palpations: Abdomen is soft.  Musculoskeletal:        General: Normal range of  motion.     Comments: Nonpitting edema bilateral legs   Skin:    General: Skin is warm.     Capillary Refill: Capillary refill takes less than 2 seconds.  Neurological:     General: No focal deficit present.     Mental Status: She is alert and oriented to person, place, and time.  Psychiatric:        Mood and Affect: Mood normal.        Behavior: Behavior normal.    ED Results / Procedures / Treatments   Labs (all labs ordered are listed, but only abnormal results are displayed) Labs Reviewed  CBC - Abnormal; Notable for the following components:      Result Value   Hemoglobin 11.7 (*)    All other components within normal limits  BASIC METABOLIC PANEL  TROPONIN I (HIGH SENSITIVITY)    EKG EKG Interpretation  Date/Time:  Sunday December 31 2020 16:19:54 EST Ventricular Rate:  95 PR Interval:  190 QRS Duration: 90 QT Interval:  334 QTC Calculation: 419 R Axis:   16 Text Interpretation: Normal sinus rhythm Low voltage QRS Borderline ECG No significant change since last tracing Confirmed by Wandra Arthurs [phone]) on 12/31/2020 4:34:34 PM  Radiology No results found.  Procedures Procedures   Medications Ordered in ED Medications - No data to display  ED Course  I have reviewed the triage vital signs and the nursing notes.  Pertinent labs & imaging results that were available during my care of the patient were reviewed by me and considered in my medical decision making (see chart for details).    MDM Rules/Calculators/A&P                           Joycelyn Schmid  A Garson is a 75 y.o. female here presenting with chest pain.  Patient had chest pain with diaphoresis earlier today.  She has no known CAD and had a unremarkable cath about 2 years ago.  She just saw cardiology recently as well for hypertension. I doubt dissection or PE.  Since she is high risk for ACS, will consult cardiology after first troponin  8:10 PM I discussed case with Dr. Kalman Shan from cardiology.  He  states that since patient only has several seconds of chest pain and now is resolved, he does not recommend admission.  He states that since the second troponin is negative, patient can be followed up outpatient.  Of note, patient does have some leg swelling likely secondary to her Norvasc.  Per the cardiology note, patient can be put on carvedilol instead of Norvasc.  She has occasional palpitations and I think carvedilol will help with that as well.  Told her to call cardiology tomorrow for appointment in a week   Final Clinical Impression(s) / ED Diagnoses Final diagnoses:  None    Rx / DC Orders ED Discharge Orders     None        Drenda Freeze, MD 12/31/20 2012

## 2020-12-31 NOTE — Discharge Instructions (Signed)
Stop taking Norvasc  Please take Coreg 6.25 twice daily instead.   Please call Dr. Irish Lack tomorrow for appointment in a week.  Return to ER if you have worse chest pain, trouble breathing, leg swelling

## 2020-12-31 NOTE — ED Triage Notes (Signed)
Pt c/o intermittent mid CP since around 10am; +diaphoresis when pain happens

## 2021-01-01 ENCOUNTER — Telehealth: Payer: Self-pay | Admitting: *Deleted

## 2021-01-01 ENCOUNTER — Encounter: Payer: Self-pay | Admitting: Family Medicine

## 2021-01-01 NOTE — Telephone Encounter (Signed)
I do not recall this, do you?

## 2021-01-01 NOTE — Chronic Care Management (AMB) (Signed)
  Care Management   Note  01/01/2021 Name: WESSIE SHANKS MRN: [id number] DOB: July 30, 1945  [name] is a 75 y.o. year old female who is a primary care patient of Copland, Gay Filler, MD and is actively engaged with the care management team. I reached out to [name] by phone today to assist with re-scheduling a follow up visit with the RN Case Manager  Follow up plan: Unsuccessful telephone outreach attempt made. A HIPAA compliant phone message was left for the patient providing contact information and requesting a return call.   Julian Hy, [place] Management  Direct Dial: [phone]

## 2021-01-03 ENCOUNTER — Other Ambulatory Visit: Payer: Self-pay

## 2021-01-03 ENCOUNTER — Emergency Department (HOSPITAL_COMMUNITY): Payer: Medicare Other

## 2021-01-03 ENCOUNTER — Emergency Department (HOSPITAL_COMMUNITY)
Admission: EM | Admit: 2021-01-03 | Discharge: 2021-01-03 | Disposition: A | Discharge status: Home / Self Care | Payer: Medicare Other | Attending: Emergency Medicine | Admitting: Emergency Medicine

## 2021-01-03 DIAGNOSIS — I10 Essential (primary) hypertension: Secondary | ICD-10-CM | POA: Diagnosis not present

## 2021-01-03 DIAGNOSIS — I2510 Atherosclerotic heart disease of native coronary artery without angina pectoris: Secondary | ICD-10-CM | POA: Insufficient documentation

## 2021-01-03 DIAGNOSIS — R072 Precordial pain: Secondary | ICD-10-CM | POA: Insufficient documentation

## 2021-01-03 DIAGNOSIS — Z87891 Personal history of nicotine dependence: Secondary | ICD-10-CM | POA: Insufficient documentation

## 2021-01-03 DIAGNOSIS — R079 Chest pain, unspecified: Secondary | ICD-10-CM | POA: Diagnosis not present

## 2021-01-03 DIAGNOSIS — R0789 Other chest pain: Secondary | ICD-10-CM | POA: Diagnosis not present

## 2021-01-03 DIAGNOSIS — R0602 Shortness of breath: Secondary | ICD-10-CM | POA: Diagnosis not present

## 2021-01-03 LAB — BASIC METABOLIC PANEL
Anion gap: 9 (ref 5–15)
BUN: 16 mg/dL (ref 8–23)
CO2: 29 mmol/L (ref 22–32)
Calcium: 9 mg/dL (ref 8.9–10.3)
Chloride: 103 mmol/L (ref 98–111)
Creatinine, Ser: 0.83 mg/dL (ref 0.44–1.00)
GFR, Estimated: >60 mL/min (ref >60)
Glucose, Bld: 105 mg/dL — ABNORMAL HIGH (ref 70–99)
Potassium: 3.3 mmol/L — ABNORMAL LOW (ref 3.5–5.1)
Sodium: 141 mmol/L (ref 135–145)

## 2021-01-03 LAB — CBC
HCT: 33.4 % — ABNORMAL LOW (ref 36.0–46.0)
Hemoglobin: 10.4 g/dL — ABNORMAL LOW (ref 12.0–15.0)
MCH: 28.1 pg (ref 26.0–34.0)
MCHC: 31.1 g/dL (ref 30.0–36.0)
MCV: 90.3 fL (ref 80.0–100.0)
Platelets: 284 10*3/uL (ref 150–400)
RBC: 3.7 MIL/uL — ABNORMAL LOW (ref 3.87–5.11)
RDW: 14.6 % (ref 11.5–15.5)
WBC: 6.5 10*3/uL (ref 4.0–10.5)
nRBC: 0 % (ref 0.0–0.2)

## 2021-01-03 LAB — TROPONIN I (HIGH SENSITIVITY)
Troponin I (High Sensitivity): 3 ng/L (ref <18)
Troponin I (High Sensitivity): 4 ng/L (ref <18)

## 2021-01-03 MED ORDER — MAALOX MAX 400-400-40 MG/5ML PO SUSP
10.0000 mL | Freq: Four times a day (QID) | ORAL | 0 refills | Status: DC | PRN
Start: 2021-01-03 — End: 2021-09-04

## 2021-01-03 NOTE — ED Provider Notes (Signed)
Emergency Department Provider Note   I have reviewed the triage vital signs and the nursing notes.   HISTORY  Chief Complaint Chest Pain and Shortness of Breath   HPI DAPHNEE PREISS is a 75 y.o. female with past history reviewed below who presents to the emergency department with burning chest discomfort and elevated blood pressure.  Patient states this has been a recurrent issue for her over the past several months.  She is followed by her cardiologist, Dr. Irish Lack, and was last see on 11/2. She last had a heart cath in 2020 at the [facility].  She is tells me that she was seen in the emergency department last week and started on a different blood pressure medication with similar complaints.  Today, she was shopping and developed a central, burning chest discomfort without radiation.  No clear provoking factors.  She was at [facility] and so went to check her blood pressure and found it to be elevated along with pulse rate of 107.  She occasionally has palpitations.  She denies any syncope/near syncope symptoms.  No cough or shortness of breath.  She is been compliant with her home medications.  She states that with her cardiology team she had made a plan to return later this month to see their pharmacist and follow-up on some of her blood pressure medications. She notes that she is waiting on better BP control so that she can get a left hip surgery.    Past Medical History:  Diagnosis Date   3-vessel CAD 03/01/2015   Achalasia 09/28/2012   Allergic rhinitis 03/01/2015   Anemia    Aneurysm ([place]) 03/01/2015   BP (high blood pressure) 03/01/2015   Bronchitis 04/23/2018   Cephalalgia 08/24/2012   Overview:  ICD-10 cut over     Chest pain 04/04/2011   CN (constipation) 09/28/2012   Coughing 04/24/2017   Decreased potassium in the blood 03/01/2015   Diverticulosis    Dizziness 03/01/2015   GERD (gastroesophageal reflux disease)    Hiatal hernia    Hypercholesterolemia 03/01/2015    Hyperlipidemia    Hypertension    Ingrown toenail 08/10/2017   Inguinal hernia    Migraine headache    Onychomycosis 12/08/2017   Paresthesia of arm 03/01/2015   Schatzki's ring    Tendonitis, Achilles, right 12/08/2017   Unilateral primary osteoarthritis, left knee 10/07/2017    Patient Active Problem List   Diagnosis Date Noted   Unilateral primary osteoarthritis, left hip 10/18/2020   Greater trochanteric bursitis of left hip 10/18/2020   Headache 06/07/2020   Acute respiratory failure with hypoxia ([place]) 05/02/2020   Post-COVID chronic dyspnea 05/02/2020   Pulsatile neck mass 04/26/2020   Pneumonia due to COVID-19 virus 03/12/2020   Headache disorder 01/04/2020   Seasonal allergic conjunctivitis 10/04/2019   Dysfunction of right eustachian tube 10/04/2019   Pre-diabetes 06/02/2019   Seasonal and perennial allergic rhinitis 12/22/2018   Heart palpitations 12/22/2018   History of chest pain 07/20/2018   Brain aneurysm 07/20/2018   Dyslipidemia 05/04/2018   Bronchitis 04/23/2018   Tendonitis, Achilles, right 12/08/2017   Onychomycosis 12/08/2017   Unilateral primary osteoarthritis, left knee 10/07/2017   Ingrown toenail 08/10/2017   Coughing 04/24/2017   CAD (coronary artery disease) 03/01/2015   Aneurysm ([place]) 03/01/2015   Dizziness 03/01/2015   Paresthesia of arm 03/01/2015   Seasonal allergic rhinitis 03/01/2015   Achalasia 09/28/2012   CN (constipation) 09/28/2012   Cephalalgia 08/24/2012   Essential hypertension 04/19/2011   Hyperlipidemia  04/19/2011   GERD (gastroesophageal reflux disease) 04/19/2011   Chest pain 04/04/2011    Past Surgical History:  Procedure Laterality Date   ABDOMINAL HYSTERECTOMY     BACK SURGERY     x 3   CARPAL TUNNEL RELEASE     left wrist   CERVICAL SPINE SURGERY     HEMORRHOID SURGERY      Allergies Shellfish allergy, Ace inhibitors, Gabapentin, Pitavastatin, Topiramate, and Sulfa antibiotics  Family History  Problem  Relation Age of Onset   Allergic rhinitis Sister    Diabetes Sister    Hypertension Sister    Heart attack Father 43   Heart disease Father    Stomach cancer Maternal Grandmother    Heart disease Mother    Colon cancer Neg Hx    Angioedema Neg Hx    Asthma Neg Hx    Eczema Neg Hx    Urticaria Neg Hx    Immunodeficiency Neg Hx    Breast cancer Neg Hx    Migraines Neg Hx    Headache Neg Hx     Social History Social History   Tobacco Use   Smoking status: Former    Packs/day: 0.50    Years: 20.00    Pack years: 10.00    Types: Cigarettes    Quit date: 02/19/1984    Years since quitting: 36.8   Smokeless tobacco: Never  Vaping Use   Vaping Use: Never used  Substance Use Topics   Alcohol use: No   Drug use: No    Review of Systems  Constitutional: No fever/chills Eyes: No visual changes. ENT: No sore throat. Cardiovascular: Positive chest pain and elevated BP.  Respiratory: Denies shortness of breath. Gastrointestinal: No abdominal pain.  No nausea, no vomiting.  No diarrhea.  No constipation. Genitourinary: Negative for dysuria. Musculoskeletal: Chronic left hip pain.  Skin: Negative for rash. Neurological: Negative for headaches, focal weakness or numbness.  10-point ROS otherwise negative.  ____________________________________________   PHYSICAL EXAM:  VITAL SIGNS: ED Triage Vitals  Enc Vitals Group     BP 01/03/21 1328 (!) 157/75     Pulse Rate 01/03/21 1328 (!) 106     Resp 01/03/21 1328 18     Temp 01/03/21 1328 98.2 F (36.8 C)     Temp Source 01/03/21 1328 Oral     SpO2 01/03/21 1328 97 %   Constitutional: Alert and oriented. Well appearing and in no acute distress. Eyes: Conjunctivae are normal. Head: Atraumatic. Nose: No congestion/rhinnorhea. Mouth/Throat: Mucous membranes are moist. Neck: No stridor.   Cardiovascular: Tachycardia. Good peripheral circulation. Grossly normal heart sounds.   Respiratory: Normal respiratory effort.  No  retractions. Lungs CTAB. Gastrointestinal: No distention.  Musculoskeletal: No gross deformities of extremities. Neurologic:  Normal speech and language.  Skin:  Skin is warm, dry and intact. No rash noted.  ____________________________________________   LABS (all labs ordered are listed, but only abnormal results are displayed)  Labs Reviewed  BASIC METABOLIC PANEL - Abnormal; Notable for the following components:      Result Value   Potassium 3.3 (*)    Glucose, Bld 105 (*)    All other components within normal limits  CBC - Abnormal; Notable for the following components:   RBC 3.70 (*)    Hemoglobin 10.4 (*)    HCT 33.4 (*)    All other components within normal limits  TROPONIN I (HIGH SENSITIVITY)  TROPONIN I (HIGH SENSITIVITY)   ____________________________________________  EKG  Rate: 88 PR:  206 QTc: 445  Sinus rhythm. Narrow QRS. No ST elevation or depression. Normal T waves. No STEMI.  ____________________________________________  RADIOLOGY  DG Chest 2 View  Result Date: 01/03/2021 CLINICAL DATA:  Chest pain for several months with shortness of breath, initial encounter EXAM: CHEST - 2 VIEW COMPARISON:  12/31/2020 FINDINGS: Cardiac shadow is mildly prominent but accentuated by frontal technique. The lungs are well aerated bilaterally. No focal infiltrate or sizable effusion is noted. Degenerative changes of the thoracic spine are noted. Postsurgical changes in the cervical spine are seen. Stable scarring in the upper lobes is noted. IMPRESSION: No acute abnormality noted Electronically Signed   By: Inez Catalina M.D.   On: 01/03/2021 15:26    ____________________________________________   PROCEDURES  Procedure(s) performed:   Procedures  None  ____________________________________________   INITIAL IMPRESSION / ASSESSMENT AND PLAN / ED COURSE  Pertinent labs & imaging results that were available during my care of the patient were reviewed by me and  considered in my medical decision making (see chart for details).   Patient returns to the ED with HTN and atypical CP. Saw her cardiologist earlier this month for the same.  Her pain sounds fairly atypical based on her description.  She describes it more of a burning type pain.  Question some component of reflux.  ACS is a consideration although she had a fairly unremarkable heart cath in 2020.  She has had negative troponins including earlier this month.  Her troponin from the MSE process here is within normal limits and I do plan for trending.  Mild tachycardia on arrival but that is improved without intervention here.  Blood pressure with systolic 094B.  No evidence of hypertensive emergency.  Differential includes all life-threatening causes for chest pain. This includes but is not exclusive to acute coronary syndrome, aortic dissection, pulmonary embolism, cardiac tamponade, community-acquired pneumonia, pericarditis, musculoskeletal chest wall pain, etc.  We had a Kelvon Giannini discussion regarding her symptoms and her frustrations regarding these 2 issues.  She does have a follow-up plan with her cardiology team to reassess her blood pressure and adjust medications as needed with the pharmacist.  In terms of her chest pain.  We discussed ruling out AMI here.  She takes omeprazole daily.  Will consider adding Maalox for when she develops these symptoms.  No additional provocative testing or heart cath planned by her cardiologist, per note review, which seems appropriate based on my history and lab testing here to this point.   Repeat troponin is negative. Plan for discharge with Maalox and PCP/Cardiology follow up plan.   ____________________________________________  FINAL CLINICAL IMPRESSION(S) / ED DIAGNOSES  Final diagnoses:  Precordial chest pain    NEW OUTPATIENT MEDICATIONS STARTED DURING THIS VISIT:  New Prescriptions   ALUM & MAG HYDROXIDE-SIMETH (MAALOX MAX) 096-283-66 MG/5ML SUSPENSION     Take 10 mLs by mouth every 6 (six) hours as needed for indigestion.    Note:  This document was prepared using Dragon voice recognition software and may include unintentional dictation errors.  Nanda Quinton, MD, [facility] Emergency Medicine    Adilen Pavelko, [place] [place], MD 01/03/21 Valerie Roys

## 2021-01-03 NOTE — Discharge Instructions (Signed)
You were seen in the emergency room today with chest discomfort.  I am adding Maalox to your prescription medicines to take as needed for indigestion pain.  Please continue to take your blood pressure medications as prescribed and follow closely with your primary care as well as cardiology team as scheduled. Return with any new or suddenly worsening symptoms.

## 2021-01-03 NOTE — ED Provider Notes (Signed)
Emergency Medicine Provider Triage Evaluation Note  Stephanie Parks , a 75 y.o. female  was evaluated in triage.  Pt complains of chest pain and shortness of breath that has been ongoing for many months.  Reports "nobody is trying to help me."  Was seen at [facility] and had full evaluation 3 days ago.  Sees a cardiologist.  Unable to get in with her cardiologist until December.  Review of Systems  Positive: As above Negative: Dizzy or syncope  Physical Exam  BP (!) 157/75 (BP Location: Right Arm)   Pulse (!) 106   Temp 98.2 F (36.8 C) (Oral)   Resp 18   SpO2 97%  Gen:   Awake, no distress   Resp:  Normal effort  MSK:   Moves extremities without difficulty  Other:  Regular rhythm, tachycardic.  Lung sounds clear  Medical Decision Making  Medically screening exam initiated at 2:23 PM.  Appropriate orders placed.  Stephanie Parks was informed that the remainder of the evaluation will be completed by another provider, this initial triage assessment does not replace that evaluation, and the importance of remaining in the ED until their evaluation is complete.     Rhae Hammock, PA-C 01/03/21 [place], [place], DO 01/03/21 2992

## 2021-01-03 NOTE — ED Triage Notes (Signed)
Pt with intermittent chest pain and shob x several months. Has been seen by multiple providers for this. Also c/o high blood pressure with hx of same. Had meds adjusted this weekend.

## 2021-01-10 ENCOUNTER — Ambulatory Visit: Payer: Medicare Other | Admitting: Orthopaedic Surgery

## 2021-01-10 ENCOUNTER — Ambulatory Visit (INDEPENDENT_AMBULATORY_CARE_PROVIDER_SITE_OTHER): Payer: Medicare Other | Admitting: Pharmacist

## 2021-01-10 ENCOUNTER — Other Ambulatory Visit: Payer: Self-pay

## 2021-01-10 VITALS — BP 128/60 | HR 68

## 2021-01-10 DIAGNOSIS — I10 Essential (primary) hypertension: Secondary | ICD-10-CM | POA: Diagnosis not present

## 2021-01-10 LAB — BASIC METABOLIC PANEL
BUN/Creatinine Ratio: 15 (ref 12–28)
BUN: 15 mg/dL (ref 8–27)
CO2: 29 mmol/L (ref 20–29)
Calcium: 8.9 mg/dL (ref 8.7–10.3)
Chloride: 101 mmol/L (ref 96–106)
Creatinine, Ser: 0.99 mg/dL (ref 0.57–1.00)
Glucose: 90 mg/dL (ref 70–99)
Potassium: 3.7 mmol/L (ref 3.5–5.2)
Sodium: 141 mmol/L (ref 134–144)
eGFR: 59 mL/min/{1.73_m2} — ABNORMAL LOW (ref >59)

## 2021-01-10 MED ORDER — CARVEDILOL 6.25 MG PO TABS: 6.2500 mg | ORAL_TABLET | Freq: Two times a day (BID) | ORAL | 3 refills | Status: DC

## 2021-01-10 NOTE — Progress Notes (Signed)
Patient ID: Stephanie [place]                 DOB: 10/15/1945                      MRN: [id number]     HPI: Stephanie Parks is a 75 y.o. female referred by Dr. Irish [place] to HTN clinic. PMH is significant for HTN, HLD, brain aneurysm, palpitations, frequent trips to urgent care ER for chest pain, cath with no PCI needed and knee arthritis needing knee replacement. Patient saw Dr. Irish [place] on 11/2. Her metoprolol was stopped and she was started on amlodipine. Per Dr. Irish [place] note, patient has LLE at baseline. Patient was seen in the ED on 11/13 for chest pain, complained of swelling of her legs and her amlodipine was stopped. She was started on carvedilol. She was seen again in the ED on 11/16 for chest pain/burning. Thought to be from GERD. Advised to use Mallox.  Patient presents to clinic today. She denies dizziness, lightheadedness. She states she has swelling in her ankle and foot. States swelling in her foot is new and thinks its from the carvedilol. Very minimal swelling seen on exam. States she elevates her feet. Says compression stocking are too hard to get on. She only checks her BP at home once a week bc when its high it causes her anxiety. She is in pain, walks with a cane.   Current HTN meds: carvedilol 6.25mg  twice a day, losartan 100mg  daily,  Previously tried: spironolactone, irbesartan, amlodipine BP goal: <130/80  Family History: The patient's family history includes Allergic rhinitis in her sister; Diabetes in her sister; Heart attack (age of onset: 25) in her father; Heart disease in her father and mother; Hypertension in her sister; Stomach cancer in her maternal grandmother.   Social History: The patient  reports that she quit smoking about 36 years ago. Her smoking use included cigarettes. She has a 10.00 pack-year smoking history. She has never used smokeless tobacco. She reports that she does not drink alcohol and does not use drugs.   Diet: coffee once or twice a week, tea  very seldom, gingerale, chocolate  Exercise: limited due to hip pain  Home BP readings: 136/77   Wt Readings from Last 3 Encounters:  12/31/20 199 lb (90.3 kg)  12/20/20 202 lb (91.6 kg)  11/15/20 196 lb 9.6 oz (89.2 kg)   BP Readings from Last 3 Encounters:  01/03/21 (!) 155/80  12/31/20 (!) 154/78  12/20/20 (!) 160/84   Pulse Readings from Last 3 Encounters:  01/03/21 76  12/31/20 79  12/20/20 72    Renal function: Estimated Creatinine Clearance: 65 mL/min (by C-G formula based on SCr of 0.83 mg/dL).  Past Medical History:  Diagnosis Date   3-vessel CAD 03/01/2015   Achalasia 09/28/2012   Allergic rhinitis 03/01/2015   Anemia    Aneurysm ([place]) 03/01/2015   BP (high blood pressure) 03/01/2015   Bronchitis 04/23/2018   Cephalalgia 08/24/2012   Overview:  ICD-10 cut over     Chest pain 04/04/2011   CN (constipation) 09/28/2012   Coughing 04/24/2017   Decreased potassium in the blood 03/01/2015   Diverticulosis    Dizziness 03/01/2015   GERD (gastroesophageal reflux disease)    Hiatal hernia    Hypercholesterolemia 03/01/2015   Hyperlipidemia    Hypertension    Ingrown toenail 08/10/2017   Inguinal hernia    Migraine headache    Onychomycosis 12/08/2017   Paresthesia  of arm 03/01/2015   Schatzki's ring    Tendonitis, Achilles, right 12/08/2017   Unilateral primary osteoarthritis, left knee 10/07/2017    Current Outpatient Medications on File Prior to Visit  Medication Sig Dispense Refill   acetaminophen (TYLENOL) 325 MG tablet Take 650 mg by mouth. For headaches     alum & mag hydroxide-simeth (MAALOX MAX) 400-400-40 MG/5ML suspension Take 10 mLs by mouth every 6 (six) hours as needed for indigestion. 355 mL 0   amLODipine (NORVASC) 5 MG tablet Take 1 tablet (5 mg total) by mouth daily. 90 tablet 3   Calcium Carb-Cholecalciferol (CALCIUM 600 + D PO) Take 1 tablet by mouth daily.     carvedilol (COREG) 6.25 MG tablet Take 1 tablet (6.25 mg total) by mouth 2 (two) times daily  with a meal. 30 tablet 0   EPINEPHrine (EPIPEN 2-PAK) 0.3 mg/0.3 mL IJ SOAJ injection Inject 0.3 mLs (0.3 mg total) into the muscle as needed for anaphylaxis. Per allergen immunotherapy protocol 1 each 2   fluticasone (FLONASE) 50 MCG/ACT nasal spray USE 2 SPRAYS IN NOSTRIL(S) DAILY AS NEEDED.  In the right nostril point the applicator out toward your right ear.  In the left nostril point the applicator out toward her left ear (Patient taking differently: Place 2 sprays into both nostrils daily as needed for allergies.) 16 g 9   Galcanezumab-gnlm (EMGALITY) 120 MG/ML SOAJ Inject 120 mg into the skin every 30 (thirty) days. (Patient not taking: No sig reported) 1.12 mL 11   linaclotide (LINZESS) 72 MCG capsule Take 72 mcg by mouth daily before breakfast.     losartan (COZAAR) 100 MG tablet Take 1 tablet (100 mg total) by mouth daily. 90 tablet 1   METAMUCIL FIBER PO Take by mouth.     Multiple Vitamins-Minerals (MULTIVITAL) tablet Take 1 tablet by mouth daily.      nortriptyline (PAMELOR) 25 MG capsule Take 2 capsules (50 mg total) by mouth at bedtime. 180 capsule 3   omeprazole (PRILOSEC) 40 MG capsule Take 40 mg by mouth daily.     potassium chloride SA (KLOR-CON M20) 20 MEQ tablet TAKE 2 TABLETS BY MOUTH EVERY DAY 180 tablet 1   rosuvastatin (CRESTOR) 10 MG tablet TAKE 1 TABLET BY MOUTH EVERY DAY 90 tablet 3   UBRELVY 100 MG TABS TAKE 1 TABLET BY MOUTH AS NEEDED. CAN TAKE 1 ADDITIONAL TABLET IN 2 HOURS IF NEEDED AFTER INITIAL DOSE. NO MORE THAN 2 TABLETS IN 24 HOURS. (Patient not taking: No sig reported) 10 tablet 5   Ubrogepant (UBRELVY) 100 MG TABS Take 100 mg by mouth daily as needed. Take one tablet at onset of headache, may repeat 1 tablet in 2 hours, no more than 2 tablets in 24 hours 5 tablet 0   No current facility-administered medications on file prior to visit.    Allergies  Allergen Reactions   Shellfish Allergy Hives and Swelling   Ace Inhibitors Other (See Comments)    Pt cannot  recall her reaction but tolerates arb    Gabapentin Other (See Comments)    headaches   Pitavastatin     Unknown reaction    Topiramate     Heart race,    Sulfa Antibiotics Other (See Comments) and Rash    Fine bumps    There were no vitals taken for this visit.   Assessment/Plan:  1. Hypertension - BP is at goal of <130/80 today in clinic. Do not know if its at goal at home.  Patient thinks swelling is from carvedilol. Although possible, she had swelling for years prior to starting carvedilol. I advised that she look for a pair of zip or velcro compression stocking and elevate feet whenever she is sitting. I gave her the option of continuing her current medications, which based off of todays readings is working well vs changing losartan to a different ARB or changing carvedilol to spironolactone. She was on spironolactone prior but said it caused her dizziness, but that didn't improve off of medication. She was on irbesartan prior but also said it caused dizziness. Will remain on losartan 100mg  daily and carvedilol 6.25mg  twice a day. I have asked her to check her BP at home just a few times a week. Call me if elevated. Her potassium was low in ED 11/16. She is taking KCL 20MEQ alternating with 10MEQ. Will check BMP today.  We also discussed her feelings of palpitations/heart pounding, sweating. We discussed how this could be more related to anxiety. I recommended she focus on deep breaths. I also recommended she talk with her PCP about being referred for some behavioral therapy to help with anxiety.   Thank you  Ramond Dial, Pharm.D, BCPS, CPP [facility]  [address] [place], [place] [address]  Phone: [phone]; Fax: [phone]

## 2021-01-10 NOTE — Patient Instructions (Addendum)
Please continue taking losartan 100mg  daily and carvedilol 6.25mg  twice a day  Check your blood pressure a few times a week.  Please call me at [phone] with any questions

## 2021-01-15 ENCOUNTER — Telehealth: Payer: Self-pay | Admitting: Pharmacist

## 2021-01-15 NOTE — Telephone Encounter (Signed)
Called pt to review results of her lab. K is good. She should continue alternating KCL 20MEQ and 10MEW every other day. LVM for pt to call back.

## 2021-01-15 NOTE — Chronic Care Management (AMB) (Signed)
  Care Management   Note  01/15/2021 Name: [name] MRN: [id number] DOB: 06-05-1945  Stephanie [place] is a 75 y.o. year old female who is a primary care patient of Copland, Gay Filler, MD and is actively engaged with the care management team. I reached out to Stephanie [place] by phone today to assist with re-scheduling a follow up visit with the RN Case Manager  Follow up plan: 2nd Unsuccessful telephone outreach attempt made. A HIPAA compliant phone message was left for the patient providing contact information and requesting a return call.   Julian Hy, [facility]  Direct Dial: [phone]

## 2021-01-15 NOTE — Telephone Encounter (Signed)
Pt made aware of results.

## 2021-01-17 ENCOUNTER — Other Ambulatory Visit: Payer: Self-pay

## 2021-01-17 ENCOUNTER — Other Ambulatory Visit: Payer: Self-pay | Admitting: Family Medicine

## 2021-01-17 ENCOUNTER — Ambulatory Visit (INDEPENDENT_AMBULATORY_CARE_PROVIDER_SITE_OTHER): Payer: Medicare Other | Admitting: Orthopaedic Surgery

## 2021-01-17 ENCOUNTER — Encounter: Payer: Self-pay | Admitting: Orthopaedic Surgery

## 2021-01-17 DIAGNOSIS — I10 Essential (primary) hypertension: Secondary | ICD-10-CM

## 2021-01-17 DIAGNOSIS — E785 Hyperlipidemia, unspecified: Secondary | ICD-10-CM

## 2021-01-17 DIAGNOSIS — M1612 Unilateral primary osteoarthritis, left hip: Secondary | ICD-10-CM | POA: Diagnosis not present

## 2021-01-17 NOTE — Progress Notes (Signed)
HPI: Stephanie Parks returns today follow-up of her left hip.  She states the pain is still 10 out of 10 at times.  She is using a cane to ambulate.  She did see cardiology in initially was cleared for surgery however since then she has been to the ER on 2 different occasions with intermittent chest pain.  Her troponins were negative on both visits.  There was some question of maybe this possibly anxiety.  Patient states however this did not feel like anxiety she feels like she is having occasional fluttering of her heart.  She is also returning to gain better control of blood pressure.  A current medication she has been placed on is causing lower extremity edema.  She has not followed up with her primary care physician since being seen in the ER.  She has no follow-up with cardiologist until next year.   Review of systems see: HPI otherwise negative  Physical exam: General well-developed well-nourished female no acute distress ambulates with a antalgic gait and a cane.   Left hip: Full external rotation without pain.  Limited internal rotation quite painful.  Impression: Left hip severe osteoarthritis  Plan: Recommend she see her primary care physician due to the atypical chest pain and "fluttering" heart.  To try to temporize her pain we will have her undergo an intra-articular injection of her left hip under fluoroscopy.  She will follow-up with [place] when she is cleared from cardiac standpoint and her blood pressures under good control.  Questions were encouraged and answered at length

## 2021-01-17 NOTE — Addendum Note (Signed)
Addended by: Robyne Peers on: 01/17/2021 10:37 AM   Modules accepted: Orders

## 2021-01-18 ENCOUNTER — Ambulatory Visit (INDEPENDENT_AMBULATORY_CARE_PROVIDER_SITE_OTHER): Payer: Medicare Other | Admitting: Pharmacist

## 2021-01-18 DIAGNOSIS — K219 Gastro-esophageal reflux disease without esophagitis: Secondary | ICD-10-CM

## 2021-01-18 DIAGNOSIS — I25119 Atherosclerotic heart disease of native coronary artery with unspecified angina pectoris: Secondary | ICD-10-CM

## 2021-01-18 DIAGNOSIS — E785 Hyperlipidemia, unspecified: Secondary | ICD-10-CM

## 2021-01-18 DIAGNOSIS — K5900 Constipation, unspecified: Secondary | ICD-10-CM

## 2021-01-18 DIAGNOSIS — I10 Essential (primary) hypertension: Secondary | ICD-10-CM

## 2021-01-21 NOTE — Chronic Care Management (AMB) (Signed)
Chronic Care Management Pharmacy Note  01/21/2021 Name:  SHAYLEIGH BOULDIN MRN:  [id number] DOB:  May 06, 1945   Subjective: Stephanie Parks is an 75 y.o. year old female who is a primary patient of Copland, Gay Filler, MD.  The CCM team was consulted for assistance with disease management and care coordination needs.    Engaged with patient by telephone for follow up visit in response to provider referral for pharmacy case management and/or care coordination services.   Consent to Services:  The patient was given information about Chronic Care Management services, agreed to services, and gave verbal consent prior to initiation of services.  Please see initial visit note for detailed documentation.   Patient Care Team: Copland, Gay Filler, MD as PCP - General (Family Medicine) Jettie Booze, MD as PCP - Cardiology (Cardiology) Ashok Pall, MD as Consulting Physician (Neurosurgery) Luretha Rued, RN as Case Manager Cherre Robins, RPH-CPP (Pharmacist)  Recent office visits: 11/15/2020 - Fam Med (Dr Lorelei Pont) F/U HTN. Increased metoprolol to 92m daily.  11/01/2020 - PCP (Dr CLorelei Pont F/U fluctuating BP, palpitations and chest discomfort. Felt that chest pain was musculoskeletal but checked EKG, D-Dimer, troponin and BMP. D-DImer was still high - ordered CT angiogram which was negative for blood clot / PE. Noted to have low potassium despite taking 471m daily 10/03/2020 - PCP (Dr [name] presented for acute visit with CP. [facility] but patient presented to PCP office instead. Noted CP has recolved. Labs checked. Changed omeprazole to Dexilant 6088maily. Add pepcid.  08/31/2020 - PCP (Dr CopLorelei Ponteen for anxiety and right shoulder pain; ordered mammogram; referred to ortho for shoulder pain  Recent consult visits: [facility] Seen for HTN. BP was at goal at this visit. Noted LEE -  recommended zip or velcro compression stocking and elevated feet. Checked BMP. Potassium was WNL.  01/17/2021 - Ortho Surgery (Dr BlaNinfa Lindenee for let hip pain 12/20/2020 - Cardio (Dr VarIrish Lackeen for preop cardio evaluation. Noted elevated BP. Metoprolol stopped. Started amlodipine 5mg57mily  12/12/2020 - GI (Dr HonoOren [facility]trium /[facility]) Seen for bloating and constipation. Prescribed Linzess 72mg5mntinue fiber supplement (Benefiber or Miralax) 11/22/2020 - Ortho Surgery (Dr BlackNinfa Lindenn of OA of hip. Wil scheduled left total hip arthoplasty once cleared by cardiologist.  11/15/2020 - Neuro (LomaUbaldo Glassing Seen for migraine HA. Restarted nortriptyline 25mg 38might for 1 week, then increase to 50mg a70mght. Was given #5 Ubrelvy 100mg sa65ms 11/02/2020 - Ortho Surgery (Dr WhitfielDurward Fortesd Left hip MRI with patient. Referred to Dr BlackmanNinfa Lindenuss left hip replacement 10/24/2020 - Ortho Surger - Phone call. Patient requesting stonger pain medication. Recommended MRI and possible hip replacement.  10/18/2020 - Orthopedic surgery (Dr WhitfielDurward Fortesor OA and pain of left hip. Received injection in left hip of lidocaine, methyprednisolone and bupivacaine.  10/05/2020 - Allergy and [facility]. Recieved immunotherpay  10/05/2020 - GI (Dr Honor) POren BeckmannMessage. Patient requested PPI sent to pharmacy . Omeprazole was sent. 09/27/2020 - Ortho (Dr Xu) LeftErlinda Hongip steriod injection. 09/15/2020 - GI Phone Call - continued bloating and swallowing difficulty. Dr Honor noOren Beckmannothing significant to explain symptoms on EGD and manometry from 09/01/20. Recommended H.Pylori Breath test. Patient agreed with proceeding with HBT. 09/07/2020 - Immunotherapy with allergiest office 09/01/2020 - GI - esophageal manometry performed  Hospital visits: [facility]2021 [facility] and [facility]. BP was 157/75 and HR 106. Recommended continue omeprazole for reflux and add Maalox suspension - take 10  mL eveyr 6 hours as needed for indigestion. 12/31/2020 [facility]. BP noted to be elevated and noted edema in LE. Discontinued amlodipine. Started carvedilo 6.32m twice a day. Recommended f/u with cardiologist regarding CP in 1 week. [place]07/2020 [place] for headache, dizziness and HTN. SBP at home was 160. BP in ED was 159/82 and HR 102. EKG and CT head ordered. GIven hydralazine in ED. Lab and head CT unremarkable. BP decreased. No med changes noted. 10[facility]03/2020 [facility] [place] for CP. EKG - showed bradycardia but otherwise normal. Suspected reflux. Sent to ED since patient wanted troponin checked. However instead patient made appt with PCP office.   Objective:  Lab Results  Component Value Date   CREATININE 0.99 01/10/2021   CREATININE 0.83 01/03/2021   CREATININE 0.86 12/31/2020    Lab Results  Component Value Date   HGBA1C 5.9 06/02/2019   Last diabetic Eye exam: No results found for: HMDIABEYEEXA  Last diabetic Foot exam: No results found for: HMDIABFOOTEX      Component Value Date/Time   CHOL 161 06/02/2019 1439   CHOL 176 07/28/2018 0756   TRIG 98 03/12/2020 1503   HDL 56.00 06/02/2019 1439   HDL 73 07/28/2018 0756   CHOLHDL 3 06/02/2019 1439   VLDL 17.2 06/02/2019 1439   LDLCALC 87 06/02/2019 1439   LDLCALC 93 07/28/2018 0756    Hepatic Function Latest Ref Rng & Units 11/07/2020 10/03/2020 07/24/2020  Total Protein 6.5 - 8.1 g/dL 6.9 6.8 6.6  Albumin 3.5 - 5.0 g/dL 3.4(L) 3.7 3.3(L)  AST 15 - 41 U/L 18 14 19   ALT 0 - 44 U/L 14 10 12   Alk Phosphatase 38 - 126 U/L 63 70 59  Total Bilirubin 0.3 - 1.2 mg/dL 0.3 0.3 0.4  Bilirubin, Direct 0.00 - 0.40 mg/dL - - -    Lab Results  Component Value Date/Time   TSH 3.80 03/08/2020 04:31 PM   TSH 2.64 11/10/2019 02:36 PM    CBC Latest Ref Rng & Units 01/03/2021 12/31/2020 11/07/2020  WBC 4.0 - 10.5 K/uL 6.5 7.7 6.6   Hemoglobin 12.0 - 15.0 g/dL 10.4(L) 11.7(L) 10.9(L)  Hematocrit 36.0 - 46.0 % 33.4(L) 36.3 33.3(L)  Platelets 150 - 400 K/uL 284 303 250    Lab Results  Component Value Date/Time   VD25OH 36.70 09/26/2016 10:59 AM    Clinical ASCVD: Yes  The ASCVD Risk score (Arnett DK, et al., 2019) failed to calculate for the following reasons:   The patient has a prior MI or stroke diagnosis     Social History   Tobacco Use  Smoking Status Former   Packs/day: 0.50   Years: 20.00   Pack years: 10.00   Types: Cigarettes   Quit date: 02/19/1984   Years since quitting: 36.9  Smokeless Tobacco Never   BP Readings from Last 3 Encounters:  01/10/21 128/60  01/03/21 (!) 155/80  12/31/20 (!) 154/78   Pulse Readings from Last 3 Encounters:  01/10/21 68  01/03/21 76  12/31/20 79   Wt Readings from Last 3 Encounters:  12/31/20 199 lb (90.3 kg)  12/20/20 202 lb (91.6 kg)  11/15/20 196 lb 9.6 oz (89.2 kg)    Assessment: Review of patient past medical history, allergies,  medications, health status, including review of consultants reports, laboratory and other test data, was performed as part of comprehensive evaluation and provision of chronic care management services.   SDOH:  (Social Determinants of Health) assessments and interventions performed:  SDOH Interventions    Flowsheet Row Most Recent Value  SDOH Interventions   Physical Activity Interventions Other (Comments)  [exercise currently limiting by hip pain. Awaiting surgery]        CCM Care Plan  Allergies  Allergen Reactions   Shellfish Allergy Hives and Swelling   Ace Inhibitors Other (See Comments)    Pt cannot recall her reaction but tolerates arb    Gabapentin Other (See Comments)    headaches   Pitavastatin     Unknown reaction    Topiramate     Heart race,    Sulfa Antibiotics Other (See Comments) and Rash    Fine bumps    Medications Reviewed Today     Reviewed by Cherre Robins, RPH-CPP (Pharmacist) on  01/21/21 at 2055  Med List Status: <None>   Medication Order Taking? Sig Documenting Provider Last Dose Status Informant  acetaminophen (TYLENOL) 325 MG tablet [id number] Yes Take 650 mg by mouth. For headaches [provider] Taking Active Self  alum & mag hydroxide-simeth (MAALOX MAX) 400-400-40 MG/5ML suspension [id number] Yes Take 10 mLs by mouth every 6 (six) hours as needed for indigestion. Margette Fast, MD Taking Active   Calcium Carb-Cholecalciferol (CALCIUM 600 + D PO) [id number] Yes Take 1 tablet by mouth daily. [provider] Taking Active Self  carvedilol (COREG) 6.25 MG tablet [id number] Yes Take 1 tablet (6.25 mg total) by mouth 2 (two) times daily with a meal. Jettie Booze, MD Taking Active   EPINEPHrine (EPIPEN 2-PAK) 0.3 mg/0.3 mL IJ SOAJ injection [id number] Yes Inject 0.3 mLs (0.3 mg total) into the muscle as needed for anaphylaxis. Per allergen immunotherapy protocol Ambs, Kathrine Cords, FNP Taking Active Self           Med Note Vita Barley Mar 13, 2020 12:35 PM)    fluticasone (FLONASE) 50 MCG/ACT nasal spray [id number] Yes USE 2 SPRAYS IN NOSTRIL(S) DAILY AS NEEDED.  In the right nostril point the applicator out toward your right ear.  In the left nostril point the applicator out toward her left ear  Patient taking differently: Place 2 sprays into both nostrils daily as needed for allergies.   Dara Hoyer, FNP Taking Active Self           Med Note Vita Barley Mar 13, 2020 12:32 PM)    Donah Driver [facility]) 120 MG/ML SOAJ [id number] No Inject 120 mg into the skin every 30 (thirty) days.  Patient not taking: Reported on 01/21/2021   Melvenia Beam, MD Not Taking Active Self  losartan (COZAAR) 100 MG tablet [id number] Yes Take 1 tablet (100 mg total) by mouth daily. Jettie Booze, MD Taking Active Self  METAMUCIL FIBER PO [id number] Yes Take by mouth. [provider] Taking Active Self           Med Note Juleen China,  Jodi Marble Sep 14, 2020  2:47 PM) Reports takes every other day (powder form)  Multiple Vitamins-Minerals (MULTIVITAL) tablet [id number] Yes Take 1 tablet by mouth daily.  [provider] Taking Active Self           Med Note Pollyann Kennedy Aug 17, 2020 11:19 AM) Alive  vit  nortriptyline (PAMELOR) 25 MG capsule [id number] Yes Take 2 capsules (50 mg total) by mouth at bedtime. Lomax, Amy, NP Taking Active Self  omeprazole (PRILOSEC) 40 MG capsule [id number] Yes Take 40 mg by mouth daily. [provider] Taking Active Self  potassium chloride SA (KLOR-CON M20) 20 MEQ tablet [id number] Yes TAKE 2 TABLETS BY MOUTH EVERY DAY  Patient taking differently: Pt is alternating 2 tablet and 1 tablet every other day   Copland, Gay Filler, MD Taking Active Self           Med Note Luretha Rued   Tue Nov 28, 2020  2:34 PM) Reports she is taking every other day.  rosuvastatin (CRESTOR) 10 MG tablet [id number] Yes TAKE 1 TABLET BY MOUTH EVERY DAY Copland, Gay Filler, MD Taking Active   UBRELVY 100 MG TABS [id number] Yes TAKE 1 TABLET BY MOUTH AS NEEDED. CAN TAKE 1 ADDITIONAL TABLET IN 2 HOURS IF NEEDED AFTER INITIAL DOSE. NO MORE THAN 2 TABLETS IN 24 HOURS. Melvenia Beam, MD Taking Active Self  Ubrogepant (UBRELVY) 100 MG TABS [id number] Yes Take 100 mg by mouth daily as needed. Take one tablet at onset of headache, may repeat 1 tablet in 2 hours, no more than 2 tablets in 24 hours Debbora Presto, NP Taking Active Self  Med List Note (Copland, Gay Filler, MD 04/06/20 1050):              Patient Active Problem List   Diagnosis Date Noted   Unilateral primary osteoarthritis, left hip 10/18/2020   Greater trochanteric bursitis of left hip 10/18/2020   Headache 06/07/2020   Acute respiratory failure with hypoxia ([place]) 05/02/2020   Post-COVID chronic dyspnea 05/02/2020   Pulsatile neck mass 04/26/2020   Pneumonia due to COVID-19 virus 03/12/2020   Headache disorder 01/04/2020    Seasonal allergic conjunctivitis 10/04/2019   Dysfunction of right eustachian tube 10/04/2019   Pre-diabetes 06/02/2019   Seasonal and perennial allergic rhinitis 12/22/2018   Heart palpitations 12/22/2018   History of chest pain 07/20/2018   Brain aneurysm 07/20/2018   Dyslipidemia 05/04/2018   Bronchitis 04/23/2018   Tendonitis, Achilles, right 12/08/2017   Onychomycosis 12/08/2017   Unilateral primary osteoarthritis, left knee 10/07/2017   Ingrown toenail 08/10/2017   Coughing 04/24/2017   CAD (coronary artery disease) 03/01/2015   Aneurysm ([place]) 03/01/2015   Dizziness 03/01/2015   Paresthesia of arm 03/01/2015   Seasonal allergic rhinitis 03/01/2015   Achalasia 09/28/2012   CN (constipation) 09/28/2012   Cephalalgia 08/24/2012   Essential hypertension 04/19/2011   Hyperlipidemia 04/19/2011   GERD (gastroesophageal reflux disease) 04/19/2011   Chest pain 04/04/2011    Immunization History  Administered Date(s) Administered   PFIZER(Purple Top)SARS-COV-2 Vaccination 06/07/2019, 06/30/2019    Conditions to be addressed/monitored: CAD, HTN, HLD, Anxiety, and migraine headache; achalasia; GERD; OA; pre DM  Care Plan : General Pharmacy (Adult)  Updates made by Cherre Robins, RPH-CPP since 01/21/2021 12:00 AM     Problem: Chronic Headaches, Aneurysm; CAD; HDL; HTN; bronchitis; achalasia; GERD; anxiety; chronic constipation; OA   Priority: High  Onset Date: 09/28/2020     Long-Range Goal: Medication and chronic conditions management   Start Date: 09/28/2020  This Visit's Progress: On track  Priority: High  Note:   Current Barriers:  Unable to independently afford treatment regimen Unable to achieve control of migraine headaches or HTN   Pharmacist Clinical Goal(s):  Over the next 90 days, patient will verbalize ability to afford treatment regimen achieve  adherence to monitoring guidelines and medication adherence to achieve therapeutic efficacy achieve control of  migraine headaches as evidenced by decreasing frequency of migraine headaches maintain control of HTN as evidenced by BP <140/90  through collaboration with PharmD and provider.   Interventions: 1:1 collaboration with Copland, Gay Filler, MD regarding development and update of comprehensive plan of care as evidenced by provider attestation and co-signature Inter-disciplinary care team collaboration (see longitudinal plan of care) Comprehensive medication review performed; medication list updated in electronic medical record  Migraine Headaches:  Improved since starting nortriptyline  Has only needed rescue / acute medication, Ubrelvy once since started nortryptyline Goal: decrease frequency of headaches to less than 10 days per month Managed by neurology - Amy Lomax Current Therapy:  Emgality - on med list but not taking currently due to cost Ubrelvy 183m - take 1 tablet as needed for headache. May take an additional tablet if needed in 2 hours. No more than 2 tablets in 24 hours Nortriptyline 565mat bedtime Previous therapies: topiramate, nortriptyline, gabapentin, Ajovy (08/17/2020 - not covered by insurance so changed to [employer] cost and questionable efficacy Patient reports she had migraines several years ago but had been rare until May 2022. She feels like return of migraine headaches is linked to COVID infection from 03/2020 10/2020: She has not seen any improvement in headache frequency since starting Emgality.  She is also concerned with cost of Emgality - $100 per month 10/2020: Patient reported that UbRoselyn Meierelps for a few hours but headache returns quickly and she is unable to take every day. 10/2020: At last visit reviewed patient's formulary / called insurance - [facility] patient assistance applications Ajovy, Vyepti, Nurtec and QuLenoria Chimere all non formulary (but called insurance and could ask for coverage determination if she has tried but failed  other formulary medications Emgality and Aimovig are tier 4 - $100 / 30 days I did review other possible ways to help with cost of these medications Looked to see if [employer] open however it is currently closed. If patient is to stay on Emgality, could apply for patient assistance through the [employer] is assistance available for Nurtec. Application has been sent but patient reports she has not completed 12/21/2020: Headache frequency has decreased significantly per patient since restarting nortriptyline. She states UbRoselyn Meiers expensive by has only needed to use once recently and has 2 more samples if needed.There is not currently a medication assistance program for Ubrelvy  01/18/2021: Headaches frequency continues to improve since starting nortriptyline. Has not needed UbRoselyn Meierecently. Still has samples. Medicare benefits will restart Jan 2023  Interventions:  Continue current therapy for headache prevention and treatment Continue to follow up with neurology  Hypertension: Improving since started carvedilol; BP goal per cardiology notes <130/80 Patient saw cardiologist 01/01/2021 - started carvedilol 6.2592mwice a day Patient has consult with clinical pharmacist at cardiologist's office 01/10/2021. Blood pressure noted to be at goal.   BP Readings from Last 3 Encounters:  01/10/21 128/60  01/03/21 (!) 155/80  12/31/20 (!) 154/78  Current treatment: Losartan 100m20mily Carvedilol 6.25mg19mce a day Current home readings: 140/87 Past medications tried: amlodipine; metoprolol; triamterene-hydrochlorothiazide (stopped 03/2020 during hospitalization due to normal blood pressure) Patient reports edema. She feels it is related to carvedilol. She has agreed to continue for a few more days and will follow up with clinical pharmacist at cardiologist office if edema continues.  Interventions:  Discussed blood pressure  goals Recommend she only check  blood pressure and heart rate twice per week , record and bring to future appointments Future considerations: restart diuretic (triamterene-hydrochlorothiazide) or increase carvedilol  Hyperlipidemia / CAD: No recent LDL but patient is on moderate to high intensity statin with CAD.  Current treatment: Rosuvastatin 76m daily Interventions:  Continue current regimen Consider checking lipid panel with next labs  Chronic constipation:  Controlled Current treatment:  Linzess 771m daily Previous therapies: Amitiza 66m71m- patient could not remember why stopped Interventions:  RevSchertzoth Linzess and generic Amitiza are tier 3 or $47/month. Patient will enter Medicare coverage gap soon.  Mailed application for Linzess patient assistance at last visit but patient has not completed yet.  Osteopenia:  Last DEXA 09/12/2016 T-Score for Left femur nek = -1.2 T-Score for AP Spine = + 1.3 FRAX = 3.9% for osteoporotic fracture / 0.5% hip fracture Current Therapy:  Calcium + D - 500m466mily Previous therapy: alendronate Interventions:  Continue to take calcium + D daily - goal is to get 1200mg71mcalcium daily from supplement and diet Vitamin D goal is to get 1000 IU daily Fall prevention Consider recheck DEXA  GERD constipation :  Improved Saw gastroenterologist - Dr HonorOren Beckmannent therapy:  Omeprazole 40mg 766my Miralax or Benefiber daily Linzess 72mcg 366my (not taking due to cost)  Dr Lowne rEtter Sjogrenended she change omeprazole to Dexilant and add Pepcid up to bid as needed.  Patient never tried either Dexilant or Pepcid. She states her pharmacy told her she should not take both omeprazole and Dexilant.  Denies reflux symptoms or chest pain since 08/16/245/80/9983ted application for Linzess patient assistance program at last vist Interventions: Explained that she was to try Dexilant in place of omeprazole since she was having reflux symptoms. Patient  reports she would like to stick with omeprazole. Med list updated.  Recommended if she has breakthrough symptoms she can try OTC Pepcid up to twice a day  Called [employer] patient assistance program. Application for Linzess received 01/05/2021 but is still currently in process. They recommended follow up next week.   Health Maintenance:  Reviewed vaccination history and discussed benefits of annual flu, COVID bivalent booster, Shingrix, pneumonia and Tdap vaccinations Patient declined annual flu and COVID bivalent booster Considering Tetanus, pneumonia and Shingrix but would like to delay until 2023.   Patient Goals/Self-Care Activities Over the next 90 days, patient will:  take medications as prescribed, check blood pressure daily, document, and provide at future appointments, collaborate with provider on medication access solutions, and work with neurologist regarding migraine headaches   Follow Up Plan: Telephone follow up appointment with care management team member scheduled for:  1 to 2 months; Will check on patient assistance program application in 1 to 2 weeks.         Medication Assistance: Application for Linzess  medication assistance program. in process.  Anticipated assistance start date 02/01/2021.  See plan of care for additional detail.  Patient's preferred pharmacy is:  [id number] - [id number], [place] [phone] Up:  Patient agrees to Care Plan and Follow-up.  Plan: Telephone follow up appointment with care management team member scheduled for:  4 to 8 weeks  Efraim Vanallen EcCherre Robins Clinical Pharmacist [facility]

## 2021-01-21 NOTE — Patient Instructions (Signed)
Mrs. Wah It was a pleasure speaking with you today.  I have attached a summary of our visit today and information about your health goals.   Our next appointment is by telephone on January 5th, 2023 at 2:30pm  Please call the care guide team at [phone] if you need to cancel or reschedule your appointment.   If you have any questions or concerns, please feel free to contact me either at the phone number below or with a MyChart message.   Keep up the good work!  Cherre Robins, PharmD Clinical Pharmacist [facility] [phone] (direct line)  [phone] (main office number)   Migraine Headaches:  Goal: decrease frequency of headaches to less than 15 days per month Managed by neurology - Amy Lomax Current Therapy:  Nortriptyline 25mg  - take 2 capsules = 50mg  at bedtime Ubrelvy 100mg  - take 1 tablet as needed for headache. May take an additional tablet if needed in 2 hours. No more than 2 tablets in 24 hours Interventions:    Continue current therapy for migraine prevention and treatment Continue to follow up with neurology  Hypertension: Improving;   BP goal <130/80 (pre cardiology)  BP Readings from Last 3 Encounters:  01/10/21 128/60  01/03/21 (!) 155/80  12/31/20 (!) 154/78   Current treatment: Carvedilol 6.25mg  twice a day Interventions:  Discussed blood pressure goals Continue to check blood pressure and heart rate twice per week, record and bring to future appointments Follow up with cardiologist / clinical pharmacist next week if edema not improved.   Hyperlipidemia / CAD: Taking moderate to high intensity statin. Current treatment: rosuvastatin 10mg  daily Interventions:  Continue current regimen Consider checking lipid panel with next labs  Chronic constipation:  Current treatment:  Linzess 35mcg daily Previous therapies: Amitiza 54mcg - patient could not remember why stopped Interventions:  [place], both Linzess and generic Amitiza are tier 3 or $47/month. Patient in Medicare coverage gap. Called [employer] patient assistance program. Application for Linzess received 01/05/2021 but is still currently in process. They recommended follow up next week.   Osteopenia / low bone density:  Current Therapy:  Calcium + D - 500mg  daily Interventions:  Continue to take calcium + D daily - goal is to get 1200mg  of calcium daily from supplement and diet Vitamin D goal is to get 1000 IU daily Fall prevention Consider rechecking DEXA  GERD / acid reflux:  Goal: Decrease symptoms of acid reflux Current therapy:  Omeprazole 40mg  daily Interventions:  Recommended if she has breakthrough symptoms she can try OTC Pepcid up to twice a day Monitor for symptoms of acid reflux and report to office if experience these symptoms:  A burning sensation in your chest (heartburn), usually after eating, which might be worse at night. Chest pain. Difficulty swallowing. Regurgitation of food or sour liquid. Sensation of a lump in your throat. Continue as planned to f/u with Dr Oren Beckmann / Gastroenterologist   Health Maintenance:  Reviewed vaccination history and discussed benefits of annual flu, COVID bivalent booster, Shingrix, pneumonia and Tdap vaccinations Patient declined annual flu and COVID bivalent booster Consider Tetanus, pneumonia and Shingrix vaccinations in 2023  Patient Goals/Self-Care Activities Over the next 90 days, patient will:  Take medications as prescribed,  Check blood pressure twice per week, document, and provide at future appointments,  Collaborate with provider on medication access solutions, and  Work with neurologist regarding migraine headaches   Follow Up Plan: Telephone follow up appointment with  care management team member scheduled for: 4 to 8 weeks    Patient verbalizes understanding of instructions provided today and agrees to view in [place].

## 2021-01-22 ENCOUNTER — Ambulatory Visit (INDEPENDENT_AMBULATORY_CARE_PROVIDER_SITE_OTHER): Payer: Medicare Other

## 2021-01-22 DIAGNOSIS — J309 Allergic rhinitis, unspecified: Secondary | ICD-10-CM | POA: Diagnosis not present

## 2021-01-23 NOTE — Chronic Care Management (AMB) (Signed)
  Care Management   Note  01/23/2021 Name: Stephanie Parks MRN: [id number] DOB: 05-Dec-1945  Stephanie Parks is a 75 y.o. year old female who is a primary care patient of Copland, Gay Filler, MD and is actively engaged with the care management team. I reached out to Stephanie Parks by phone today to assist with re-scheduling a follow up visit with the RN Case Manager  Follow up plan: We have been unable to make contact with the patient for follow up. The care management team is available to follow up with the patient after provider conversation with the patient regarding recommendation for care management engagement and subsequent re-referral to the care management team.   Julian Hy, [place] Management  Direct Dial: [phone]

## 2021-01-24 ENCOUNTER — Telehealth: Payer: Self-pay | Admitting: Pharmacist

## 2021-01-24 ENCOUNTER — Ambulatory Visit: Payer: Self-pay

## 2021-01-24 DIAGNOSIS — E785 Hyperlipidemia, unspecified: Secondary | ICD-10-CM

## 2021-01-24 DIAGNOSIS — E876 Hypokalemia: Secondary | ICD-10-CM

## 2021-01-24 DIAGNOSIS — I10 Essential (primary) hypertension: Secondary | ICD-10-CM

## 2021-01-24 MED ORDER — SPIRONOLACTONE 25 MG PO TABS: 12.5000 mg | ORAL_TABLET | Freq: Every day | ORAL | 11 refills | Status: DC

## 2021-01-24 NOTE — Chronic Care Management (AMB) (Signed)
  Care Management   Follow Up Note   01/24/2021 Name: Stephanie Parks MRN: [id number] DOB: [birth year]   Referred by: [name], MD Reason for referral : Chronic Care Management   RNCM received message from Care Guide of unsuccessful outreach x3 attempts to reschedule patient with no response. RNCM is available to follow up with the patient after provider conversation with the patient regarding recommendation for care management engagement and subsequent re-referral for RNCM.The patient is currently active with the embedded clinical pharmacist, [name] Eckard.   Follow Up Plan:  RNCM will close out of Case.  Embedded Clinical Pharmacist will still continue to follow. Clinical pharmacist updated.   Thea Silversmith, RN, MSN, BSN, CCM Care Management Coordinator [facility] [phone]

## 2021-01-24 NOTE — Telephone Encounter (Signed)
Patient called stating that she thinks the carvedilol is causing her swelling and she wants to change medications. Reports BP being up. Will stop carvedilol and start spironolactone 12.5mg  daily. Decrease KCL supplements to 10 MEQ daily. Check BMP on 12/15. Office visit 1/4.

## 2021-01-24 NOTE — Patient Instructions (Signed)
Visit Information  Thank you for allowing me to share the care management and care coordination services that are available to you as part of your health plan and services through your primary care provider and medical home. Please reach out to me at [phone] if the care management/care coordination team may be of assistance to you in the future.   Desiraye Rolfson, RN, MSN, BSN, CCM Care Management Coordinator [facility] [phone]  

## 2021-01-30 ENCOUNTER — Other Ambulatory Visit: Payer: Self-pay | Admitting: Orthopaedic Surgery

## 2021-01-30 ENCOUNTER — Telehealth: Payer: Self-pay | Admitting: Orthopaedic Surgery

## 2021-01-30 MED ORDER — TRAMADOL HCL 50 MG PO TABS: 50.0000 mg | ORAL_TABLET | Freq: Four times a day (QID) | ORAL | 0 refills | Status: DC | PRN

## 2021-01-30 NOTE — Telephone Encounter (Signed)
Please advise 

## 2021-01-30 NOTE — Telephone Encounter (Signed)
Lvm for pt to cb to advise 

## 2021-01-30 NOTE — Telephone Encounter (Signed)
Pt called requesting pain medication. Pt states she can barely walk. Please send to pharmacy on file. Pt phone number is [phone].

## 2021-02-01 ENCOUNTER — Other Ambulatory Visit: Payer: Self-pay

## 2021-02-01 ENCOUNTER — Other Ambulatory Visit: Payer: Medicare Other

## 2021-02-01 ENCOUNTER — Ambulatory Visit (INDEPENDENT_AMBULATORY_CARE_PROVIDER_SITE_OTHER): Payer: Medicare Other

## 2021-02-01 DIAGNOSIS — J309 Allergic rhinitis, unspecified: Secondary | ICD-10-CM

## 2021-02-01 DIAGNOSIS — I10 Essential (primary) hypertension: Secondary | ICD-10-CM

## 2021-02-01 DIAGNOSIS — E876 Hypokalemia: Secondary | ICD-10-CM

## 2021-02-01 NOTE — Telephone Encounter (Signed)
Left another message.

## 2021-02-02 ENCOUNTER — Telehealth: Payer: Self-pay | Admitting: Pharmacist

## 2021-02-02 LAB — BASIC METABOLIC PANEL
BUN/Creatinine Ratio: 20 (ref 12–28)
BUN: 16 mg/dL (ref 8–27)
CO2: 27 mmol/L (ref 20–29)
Calcium: 9 mg/dL (ref 8.7–10.3)
Chloride: 101 mmol/L (ref 96–106)
Creatinine, Ser: 0.8 mg/dL (ref 0.57–1.00)
Glucose: 113 mg/dL — ABNORMAL HIGH (ref 70–99)
Potassium: 3.7 mmol/L (ref 3.5–5.2)
Sodium: 141 mmol/L (ref 134–144)
eGFR: 77 mL/min/{1.73_m2} (ref >59)

## 2021-02-02 NOTE — Telephone Encounter (Signed)
BMP is stable. Patient to continue spironolactone and KCL 20 MEQ daily. F/u in clinic 1/4. Need to confirm if she is taking 10 MEQ or 20 MEQ of KCL. LVM for pt to call back

## 2021-02-06 ENCOUNTER — Encounter: Payer: Self-pay | Admitting: Internal Medicine

## 2021-02-06 ENCOUNTER — Ambulatory Visit (INDEPENDENT_AMBULATORY_CARE_PROVIDER_SITE_OTHER): Payer: Medicare Other | Admitting: Internal Medicine

## 2021-02-06 VITALS — BP 162/98 | HR 105 | Temp 98.0°F | Resp 18 | Ht 65.0 in | Wt 207.0 lb

## 2021-02-06 DIAGNOSIS — R600 Localized edema: Secondary | ICD-10-CM

## 2021-02-06 DIAGNOSIS — I10 Essential (primary) hypertension: Secondary | ICD-10-CM | POA: Diagnosis not present

## 2021-02-06 DIAGNOSIS — R601 Generalized edema: Secondary | ICD-10-CM | POA: Diagnosis not present

## 2021-02-06 MED ORDER — ETHACRYNIC ACID 25 MG PO TABS: 25.0000 mg | ORAL_TABLET | Freq: Every day | ORAL | 0 refills | Status: DC

## 2021-02-06 MED ORDER — CARVEDILOL 6.25 MG PO TABS: 6.2500 mg | ORAL_TABLET | Freq: Two times a day (BID) | ORAL | 1 refills | Status: DC

## 2021-02-06 NOTE — Progress Notes (Signed)
Subjective:    Patient ID: Stephanie Parks, female    DOB: August 27, 1945, 75 y.o.   MRN: [id number]  DOS:  02/06/2021 Type of visit - description: acute  Main concern today is lower extremity edema.  She saw cardiology 01/10/2021, they noted that she was seen in the ED 01/02/2021, due to leg swelling, amlodipine was discontinued and she started carvedilol.  On 01/24/2021 she  report ongoing swelling, carvedilol was discontinued and she was recommended spironolactone.  Since then, BP has been in the high side, 150s. Heart rate around 105.  She denies any chest pain, admits to  mild shortness of breath. Orthopnea?  For a while she has been sleeping with the head elevated thus unlikely she has orthopnea.   No cough fever or chills.  Wt Readings from Last 3 Encounters:  02/06/21 207 lb (93.9 kg)  12/31/20 199 lb (90.3 kg)  12/20/20 202 lb (91.6 kg)    Review of Systems See above   Past Medical History:  Diagnosis Date   3-vessel CAD 03/01/2015   Achalasia 09/28/2012   Allergic rhinitis 03/01/2015   Anemia    Aneurysm ([place]) 03/01/2015   BP (high blood pressure) 03/01/2015   Bronchitis 04/23/2018   Cephalalgia 08/24/2012   Overview:  ICD-10 cut over     Chest pain 04/04/2011   CN (constipation) 09/28/2012   Coughing 04/24/2017   Decreased potassium in the blood 03/01/2015   Diverticulosis    Dizziness 03/01/2015   GERD (gastroesophageal reflux disease)    Hiatal hernia    Hypercholesterolemia 03/01/2015   Hyperlipidemia    Hypertension    Ingrown toenail 08/10/2017   Inguinal hernia    Migraine headache    Onychomycosis 12/08/2017   Paresthesia of arm 03/01/2015   Schatzki's ring    Tendonitis, Achilles, right 12/08/2017   Unilateral primary osteoarthritis, left knee 10/07/2017    Past Surgical History:  Procedure Laterality Date   ABDOMINAL HYSTERECTOMY     BACK SURGERY     x 3   CARPAL TUNNEL RELEASE     left wrist   CERVICAL SPINE SURGERY     HEMORRHOID SURGERY       Allergies as of 02/06/2021       Reactions   Shellfish Allergy Hives, Swelling   Ace Inhibitors Other (See Comments)   Pt cannot recall her reaction but tolerates arb    Gabapentin Other (See Comments)   headaches   Pitavastatin    Unknown reaction    Topiramate    Heart race,    Sulfa Antibiotics Other (See Comments), Rash   Fine bumps        Medication List        Accurate as of February 06, 2021 11:59 PM. If you have any questions, ask your nurse or doctor.          STOP taking these medications    Emgality 120 MG/ML Soaj Generic drug: Galcanezumab-gnlm Stopped by: Kathlene November, MD   spironolactone 25 MG tablet Commonly known as: ALDACTONE Stopped by: Kathlene November, MD       TAKE these medications    acetaminophen 325 MG tablet Commonly known as: TYLENOL Take 650 mg by mouth. For headaches   CALCIUM 600 + D PO Take 1 tablet by mouth daily.   carvedilol 6.25 MG tablet Commonly known as: COREG Take 1 tablet (6.25 mg total) by mouth 2 (two) times daily with a meal. Started by: Kathlene November, MD   EPINEPHrine 0.3  mg/0.3 mL Soaj injection Commonly known as: EpiPen 2-Pak Inject 0.3 mLs (0.3 mg total) into the muscle as needed for anaphylaxis. Per allergen immunotherapy protocol   ethacrynic acid 25 MG tablet Commonly known as: Edecrin Take 1 tablet (25 mg total) by mouth daily. Started by: Kathlene November, MD   fluticasone 50 MCG/ACT nasal spray Commonly known as: FLONASE USE 2 SPRAYS IN NOSTRIL(S) DAILY AS NEEDED.  In the right nostril point the applicator out toward your right ear.  In the left nostril point the applicator out toward her left ear What changed:  how much to take how to take this when to take this reasons to take this additional instructions   losartan 100 MG tablet Commonly known as: COZAAR Take 1 tablet (100 mg total) by mouth daily.   Maalox Max 025-852-77 MG/5ML suspension Generic drug: alum & mag hydroxide-simeth Take 10 mLs by mouth  every 6 (six) hours as needed for indigestion.   METAMUCIL FIBER PO Take by mouth.   Multivital tablet Take 1 tablet by mouth daily.   nortriptyline 25 MG capsule Commonly known as: PAMELOR Take 2 capsules (50 mg total) by mouth at bedtime.   omeprazole 40 MG capsule Commonly known as: PRILOSEC Take 40 mg by mouth daily.   potassium chloride SA 20 MEQ tablet Commonly known as: KLOR-CON M TAKE 1 TABLET BY MOUTH EVERY DAY   rosuvastatin 10 MG tablet Commonly known as: CRESTOR TAKE 1 TABLET BY MOUTH EVERY DAY   traMADol 50 MG tablet Commonly known as: ULTRAM Take 1-2 tablets (50-100 mg total) by mouth every 6 (six) hours as needed.   Ubrelvy 100 MG Tabs Generic drug: Ubrogepant TAKE 1 TABLET BY MOUTH AS NEEDED. CAN TAKE 1 ADDITIONAL TABLET IN 2 HOURS IF NEEDED AFTER INITIAL DOSE. NO MORE THAN 2 TABLETS IN 24 HOURS.           Objective:   Physical Exam BP (!) 162/98 (BP Location: Left Arm, Patient Position: Sitting, Cuff Size: Small)    Pulse (!) 105    Temp 98 F (36.7 C) (Oral)    Resp 18    Ht 5\' 5"  (1.651 m)    Wt 207 lb (93.9 kg)    SpO2 96%    BMI 34.45 kg/m  General:   Well developed, NAD, BMI noted. HEENT:  Normocephalic . Face symmetric, atraumatic Neck at 45 degrees: Pulsatile mass at the right carotid artery, no apparent JVD. Lungs:  CTA B Normal respiratory effort, no intercostal retractions, no accessory muscle use. Heart: RRR,  no murmur.  Lower extremities: Trace edema bilaterally  Skin: Not pale. Not jaundice Neurologic:  alert & oriented X3.  Speech normal, gait appropriate for age and unassisted Psych--  Cognition and judgment appear intact.  Cooperative with normal attention span and concentration.  Behavior appropriate. No anxious or depressed appearing.      Assessment     75 year old female, PMH includes   high cholesterol, HTN, CAD? >>>  Negative cardiac catheterization 2020, allergic to sulfa, presents with:  Lower extremity  edema, uncontrolled hypertension. Recently amlodipine was switched to carvedilol for edema, swelling did not improve, subsequently carvedilol discontinue and she was rx  spironolactone. Weight has increased. She has mild shortness of breath but no JVD on exam. She is allergic to sulfa.  Avoid Lasix. Plan: BMP, BNP, chest x-ray to assess volume status. Add Edecrin 25 mg Restart low-dose of carvedilol Discontinue spironolactone Continue losartan. Continue potassium Monitor BPs and weight Follow-up 2 weeks.  This visit occurred during the SARS-CoV-2 public health emergency.  Safety protocols were in place, including screening questions prior to the visit, additional usage of staff PPE, and extensive cleaning of exam room while observing appropriate contact time as indicated for disinfecting solutions.

## 2021-02-06 NOTE — Patient Instructions (Addendum)
Get your blood work  Stop spironolactone  Start Edecrin 25 mg 1 tablet a day  Restart Carvedilol   Other  medications the same  Check the  blood pressure daily  BP GOAL is between 110/65 and  135/85. If it is consistently higher or lower, let me know  Check your weight daily, keep a log  Low-salt diet   GO TO THE FRONT DESK, [place] back for   a checkup in 2 weeks

## 2021-02-07 ENCOUNTER — Ambulatory Visit (INDEPENDENT_AMBULATORY_CARE_PROVIDER_SITE_OTHER): Payer: Medicare Other | Admitting: Physical Medicine and Rehabilitation

## 2021-02-07 ENCOUNTER — Ambulatory Visit: Payer: Self-pay

## 2021-02-07 ENCOUNTER — Other Ambulatory Visit: Payer: Self-pay

## 2021-02-07 ENCOUNTER — Telehealth: Payer: Self-pay | Admitting: Family Medicine

## 2021-02-07 ENCOUNTER — Encounter: Payer: Self-pay | Admitting: Physical Medicine and Rehabilitation

## 2021-02-07 DIAGNOSIS — M25552 Pain in left hip: Secondary | ICD-10-CM

## 2021-02-07 LAB — BASIC METABOLIC PANEL
BUN: 13 mg/dL (ref 6–23)
CO2: 33 mEq/L — ABNORMAL HIGH (ref 19–32)
Calcium: 9.3 mg/dL (ref 8.4–10.5)
Chloride: 102 mEq/L (ref 96–112)
Creatinine, Ser: 0.89 mg/dL (ref 0.40–1.20)
GFR: 63.46 mL/min (ref >60.00)
Glucose, Bld: 96 mg/dL (ref 70–99)
Potassium: 3.6 mEq/L (ref 3.5–5.1)
Sodium: 141 mEq/L (ref 135–145)

## 2021-02-07 LAB — BRAIN NATRIURETIC PEPTIDE: Pro B Natriuretic peptide (BNP): 18 pg/mL (ref 0.0–100.0)

## 2021-02-07 MED ORDER — BUPIVACAINE HCL 0.25 % IJ SOLN
4.0000 mL | INTRAMUSCULAR | Status: AC | PRN
  Administered 2021-02-07: 13:00:00 4 mL via INTRA_ARTICULAR

## 2021-02-07 MED ORDER — TRIAMCINOLONE ACETONIDE 40 MG/ML IJ SUSP
60.0000 mg | INTRAMUSCULAR | Status: AC | PRN
Start: 2021-02-07 — End: 2021-02-07
  Administered 2021-02-07: 13:00:00 60 mg via INTRA_ARTICULAR

## 2021-02-07 NOTE — Telephone Encounter (Signed)
We could try a different pharmacy (like our pharmacy downstairs) and she could pay out-of-pocket (is a generic medicine, hopefully she will be able to afford it for few weeks)

## 2021-02-07 NOTE — Telephone Encounter (Signed)
Patient states Dr. Larose Kells had her start her Carvedilol medication yesterday during her OV, but her pharmacy wont refill her prescription since its not due for a refill. She states that an ER doctor gave her that medication about a month ago and she threw it all away, so because of that her insurance wont cover it. She would like advice on what to do. Please advice.   She can be reached at [phone].

## 2021-02-07 NOTE — Telephone Encounter (Signed)
Spoke w/ Pt- informed of recommendations- telephone number to pharmacy downstairs given to Pt- she will call them to see cost out of pocket- also informed she could call insurance to see if they will allow for 1 time override. Pt verbalized understanding.

## 2021-02-07 NOTE — Telephone Encounter (Signed)
Please advise 

## 2021-02-07 NOTE — Progress Notes (Signed)
Pt state left hip pain. Pt state walking makes the pain worse. Pt state she takes over the counter pain meds, uses heat and pain creams to help ease her pain.  Numeric Pain Rating Scale and Functional Assessment Average Pain 8   In the last MONTH (on 0-10 scale) has pain interfered with the following?  1. General activity like being  able to carry out your everyday physical activities such as walking, climbing stairs, carrying groceries, or moving a chair?  Rating(10.)   -BT, -Dye Allergies.

## 2021-02-07 NOTE — Progress Notes (Signed)
° °  Stephanie Parks - 75 y.o. female MRN [id number]  Date of birth: Apr 02, 1945  Office Visit Note: Visit Date: 02/07/2021 PCP: [name], MD Referred by: [name], MD  Subjective: Chief Complaint  Patient presents with   Left Hip - Pain   HPI:  Stephanie Parks is a 75 y.o. female who comes in today at the request of Dr. Jean Rosenthal for planned Left anesthetic hip arthrogram with fluoroscopic guidance.  The patient has failed conservative care including home exercise, medications, time and activity modification.  This injection will be diagnostic and hopefully therapeutic.  Please see requesting physician notes for further details and justification.   ROS Otherwise per HPI.  Assessment & Plan: Visit Diagnoses:    ICD-10-CM   1. Pain in left hip  M25.552 XR C-ARM NO REPORT    Large Joint Inj: L hip joint      Plan: No additional findings.   Meds & Orders: No orders of the defined types were placed in this encounter.   Orders Placed This Encounter  Procedures   Large Joint Inj: L hip joint   XR C-ARM NO REPORT    Follow-up: Return for visit to requesting provider as needed.   Procedures: Large Joint Inj: L hip joint on 02/07/2021 1:25 PM Indications: diagnostic evaluation and pain Details: 22 G 3.5 in needle, fluoroscopy-guided anterior approach  Arthrogram: No  Medications: 4 mL bupivacaine 0.25 %; 60 mg triamcinolone acetonide 40 MG/ML Outcome: tolerated well, no immediate complications  There was excellent flow of contrast producing a partial arthrogram of the hip. The patient did have relief of symptoms during the anesthetic phase of the injection. Procedure, treatment alternatives, risks and benefits explained, specific risks discussed. Consent was given by the patient. Immediately prior to procedure a time out was called to verify the correct patient, procedure, equipment, support staff and site/side marked as required. Patient was prepped  and draped in the usual sterile fashion.         Clinical History: No specialty comments available.     Objective:  VS:  HT:     WT:    BMI:      BP:    HR: bpm   TEMP: ( )   RESP:  Physical Exam   Imaging: No results found.

## 2021-02-17 DIAGNOSIS — I10 Essential (primary) hypertension: Secondary | ICD-10-CM

## 2021-02-17 DIAGNOSIS — E785 Hyperlipidemia, unspecified: Secondary | ICD-10-CM

## 2021-02-17 DIAGNOSIS — I25119 Atherosclerotic heart disease of native coronary artery with unspecified angina pectoris: Secondary | ICD-10-CM

## 2021-02-20 ENCOUNTER — Ambulatory Visit: Payer: Medicaid Other | Admitting: Internal Medicine

## 2021-02-20 ENCOUNTER — Telehealth: Payer: Self-pay

## 2021-02-20 NOTE — Telephone Encounter (Signed)
Received a PA request for Emgality on CMM, I completed it and received this message:   This medication or product was previously approved on [id number] from 2021-02-18 to 2022-02-17.

## 2021-02-21 ENCOUNTER — Ambulatory Visit: Payer: Medicare Other

## 2021-02-21 NOTE — Progress Notes (Deleted)
Patient ID: Stephanie Parks                 DOB: [birth year]                      MRN: [id number]     HPI: Stephanie Parks is a 76 y.o. female referred by Stephanie Parks to HTN clinic. PMH is significant for HTN, HLD, brain aneurysm, palpitations, frequent trips to urgent care ER for chest pain, cath with no PCI needed and knee arthritis needing knee replacement. Patient saw Stephanie Parks on 11/2. Her metoprolol was stopped and she was started on amlodipine. Per Stephanie Parks note, patient has LLE at baseline. Patient was seen in the ED on 11/13 for chest pain, complained of swelling of her legs and her amlodipine was stopped. She was started on carvedilol. She was seen again in the ED on 11/16 for chest pain/burning. Thought to be from GERD. Advised to use Mallox.  Patient was seen in PharmD clinic on 01/10/21. Her BP was well controlled. She decided to stay on carvedilol even though she thought it was causing swelling. She had hx of swelling prior to carvedilol. We discussed talking with her PCP about her anxiety. She called the office on 12/7 stating she wanted to change blood pressure medications due to swelling with carvedilol. Carvedilol was stopped and spironolactone 12.5mg  daily was started.I  asked her to decrease her KCL tablets to 10MEQ daily. Repeat BMP was stable. However she saw her PCP on 12/20. Her BP was up and she still had swelling. Spironolactone was stopped, carvedilol was resumed and ethacrynic acid (sulfa allergy) was started.   What OTC for pain Needs BMP Taking ethcryinic acid? Home BP? Dizziness, lightheadedness, headache, blurred vision, SOB, swelling   Patient presents to clinic today. She denies dizziness, lightheadedness. She states she has swelling in her ankle and foot. States swelling in her foot is new and thinks its from the carvedilol. Very minimal swelling seen on exam. States she elevates her feet. Says compression stocking are too hard to get on. She only checks her  BP at home once a week bc when its high it causes her anxiety. She is in pain, walks with a cane.   Current HTN meds: carvedilol6.25mg  twice a day, losartan 100mg  daily, ethacrynic acid 25mg  daily, potassium chloride 20 MEQ Previously tried: spironolactone, irbesartan, amlodipine, carvedilol (swelling) BP goal: <130/80  Family History: The patient's family history includes Allergic rhinitis in her sister; Diabetes in her sister; Heart attack (age of onset: 96) in her father; Heart disease in her father and mother; Hypertension in her sister; Stomach cancer in her maternal grandmother.   Social History: The patient  reports that she quit smoking about 36 years ago. Her smoking use included cigarettes. She has a 10.00 pack-year smoking history. She has never used smokeless tobacco. She reports that she does not drink alcohol and does not use drugs.   Diet: coffee once or twice a week, tea very seldom, gingerale, chocolate  Exercise: limited due to hip pain  Home BP readings: 136/77   Wt Readings from Last 3 Encounters:  02/06/21 207 lb (93.9 kg)  12/31/20 199 lb (90.3 kg)  12/20/20 202 lb (91.6 kg)   BP Readings from Last 3 Encounters:  02/06/21 (!) 162/98  01/10/21 128/60  01/03/21 (!) 155/80   Pulse Readings from Last 3 Encounters:  02/06/21 (!) 105  01/10/21 68  01/03/21 76    Renal function:  CrCl cannot be calculated (Unknown ideal weight.).  Past Medical History:  Diagnosis Date   3-vessel CAD 03/01/2015   Achalasia 09/28/2012   Allergic rhinitis 03/01/2015   Anemia    Aneurysm ([place]) 03/01/2015   BP (high blood pressure) 03/01/2015   Bronchitis 04/23/2018   Cephalalgia 08/24/2012   Overview:  ICD-10 cut over     Chest pain 04/04/2011   CN (constipation) 09/28/2012   Coughing 04/24/2017   Decreased potassium in the blood 03/01/2015   Diverticulosis    Dizziness 03/01/2015   GERD (gastroesophageal reflux disease)    Hiatal hernia    Hypercholesterolemia 03/01/2015    Hyperlipidemia    Hypertension    Ingrown toenail 08/10/2017   Inguinal hernia    Migraine headache    Onychomycosis 12/08/2017   Paresthesia of arm 03/01/2015   Schatzki's ring    Tendonitis, Achilles, right 12/08/2017   Unilateral primary osteoarthritis, left knee 10/07/2017    Current Outpatient Medications on File Prior to Visit  Medication Sig Dispense Refill   acetaminophen (TYLENOL) 325 MG tablet Take 650 mg by mouth. For headaches     alum & mag hydroxide-simeth (MAALOX MAX) 400-400-40 MG/5ML suspension Take 10 mLs by mouth every 6 (six) hours as needed for indigestion. 355 mL 0   Calcium Carb-Cholecalciferol (CALCIUM 600 + D PO) Take 1 tablet by mouth daily.     carvedilol (COREG) 6.25 MG tablet Take 1 tablet (6.25 mg total) by mouth 2 (two) times daily with a meal. 60 tablet 1   EPINEPHrine (EPIPEN 2-PAK) 0.3 mg/0.3 mL IJ SOAJ injection Inject 0.3 mLs (0.3 mg total) into the muscle as needed for anaphylaxis. Per allergen immunotherapy protocol 1 each 2   ethacrynic acid (EDECRIN) 25 MG tablet Take 1 tablet (25 mg total) by mouth daily. 30 tablet 0   fluticasone (FLONASE) 50 MCG/ACT nasal spray USE 2 SPRAYS IN NOSTRIL(S) DAILY AS NEEDED.  In the right nostril point the applicator out toward your right ear.  In the left nostril point the applicator out toward her left ear (Patient taking differently: Place 2 sprays into both nostrils daily as needed for allergies.) 16 g 9   losartan (COZAAR) 100 MG tablet Take 1 tablet (100 mg total) by mouth daily. 90 tablet 1   METAMUCIL FIBER PO Take by mouth.     Multiple Vitamins-Minerals (MULTIVITAL) tablet Take 1 tablet by mouth daily.      nortriptyline (PAMELOR) 25 MG capsule Take 2 capsules (50 mg total) by mouth at bedtime. 180 capsule 3   omeprazole (PRILOSEC) 40 MG capsule Take 40 mg by mouth daily.     potassium chloride SA (KLOR-CON M) 20 MEQ tablet TAKE 1 TABLET BY MOUTH EVERY DAY 180 tablet 1   rosuvastatin (CRESTOR) 10 MG tablet TAKE  1 TABLET BY MOUTH EVERY DAY 90 tablet 3   traMADol (ULTRAM) 50 MG tablet Take 1-2 tablets (50-100 mg total) by mouth every 6 (six) hours as needed. 30 tablet 0   No current facility-administered medications on file prior to visit.    Allergies  Allergen Reactions   Shellfish Allergy Hives and Swelling   Ace Inhibitors Other (See Comments)    Pt cannot recall her reaction but tolerates arb    Gabapentin Other (See Comments)    headaches   Pitavastatin     Unknown reaction    Topiramate     Heart race,    Sulfa Antibiotics Other (See Comments) and Rash    Fine bumps  There were no vitals taken for this visit.   Assessment/Plan:  1. Hypertension - BP is at goal of <130/80 today in clinic. Do not know if its at goal at home. Patient thinks swelling is from carvedilol. Although possible, she had swelling for years prior to starting carvedilol. I advised that she look for a pair of zip or velcro compression stocking and elevate feet whenever she is sitting. I gave her the option of continuing her current medications, which based off of todays readings is working well vs changing losartan to a different ARB or changing carvedilol to spironolactone. She was on spironolactone prior but said it caused her dizziness, but that didn't improve off of medication. She was on irbesartan prior but also said it caused dizziness. Will remain on losartan 100mg  daily and carvedilol 6.25mg  twice a day. I have asked her to check her BP at home just a few times a week. Call me if elevated. Her potassium was low in ED 11/16. She is taking KCL 20MEQ alternating with 10MEQ. Will check BMP today.  We also discussed her feelings of palpitations/heart pounding, sweating. We discussed how this could be more related to anxiety. I recommended she focus on deep breaths. I also recommended she talk with her PCP about being referred for some behavioral therapy to help with anxiety.   Thank you  Ramond Dial,  Pharm.D, BCPS, CPP [facility]  [address] [place], [place] [address]  Phone: [phone]; Fax: [phone]

## 2021-02-22 ENCOUNTER — Telehealth: Payer: Medicaid Other

## 2021-02-22 ENCOUNTER — Telehealth: Payer: Self-pay | Admitting: Pharmacist

## 2021-02-22 NOTE — Telephone Encounter (Signed)
Attempted to contact patient for Chronic Care Management follow up phone visit. Unable to reach patient. LM on VM with my contact number [phone] Patient will be in office tomorrow to see Dr Larose Kells for hypertension follow up. Included in message that she can check in with me when in office tomorrow.

## 2021-02-23 ENCOUNTER — Encounter: Payer: Self-pay | Admitting: Internal Medicine

## 2021-02-23 ENCOUNTER — Ambulatory Visit (INDEPENDENT_AMBULATORY_CARE_PROVIDER_SITE_OTHER): Payer: Medicare Other | Admitting: Internal Medicine

## 2021-02-23 VITALS — BP 144/76 | HR 83 | Temp 98.2°F | Resp 18 | Ht 65.0 in | Wt 209.1 lb

## 2021-02-23 DIAGNOSIS — I10 Essential (primary) hypertension: Secondary | ICD-10-CM | POA: Diagnosis not present

## 2021-02-23 DIAGNOSIS — R739 Hyperglycemia, unspecified: Secondary | ICD-10-CM | POA: Diagnosis not present

## 2021-02-23 DIAGNOSIS — R635 Abnormal weight gain: Secondary | ICD-10-CM

## 2021-02-23 DIAGNOSIS — E785 Hyperlipidemia, unspecified: Secondary | ICD-10-CM | POA: Diagnosis not present

## 2021-02-23 MED ORDER — ETHACRYNIC ACID 25 MG PO TABS: 50.0000 mg | ORAL_TABLET | Freq: Every day | ORAL | 1 refills | Status: DC

## 2021-02-23 NOTE — Progress Notes (Signed)
Subjective:    Patient ID: Stephanie Parks, female    DOB: January 08, 1946, 76 y.o.   MRN: [id number]  DOS:  02/23/2021 Type of visit - description: Here for BP follow-up  Was seen by me 02/06/2021 for BP management. Meds adjusted, good compliance.  No apparent side effects. She denies chest pain, occasional short of breath. Edema "about the same".  Weight gain noted.  Admits she has not been eating healthy lately.  Wt Readings from Last 3 Encounters:  02/23/21 209 lb 2 oz (94.9 kg)  02/06/21 207 lb (93.9 kg)  12/31/20 199 lb (90.3 kg)     Review of Systems See above   Past Medical History:  Diagnosis Date   3-vessel CAD 03/01/2015   Achalasia 09/28/2012   Allergic rhinitis 03/01/2015   Anemia    Aneurysm ([place]) 03/01/2015   BP (high blood pressure) 03/01/2015   Bronchitis 04/23/2018   Cephalalgia 08/24/2012   Overview:  ICD-10 cut over     Chest pain 04/04/2011   CN (constipation) 09/28/2012   Coughing 04/24/2017   Decreased potassium in the blood 03/01/2015   Diverticulosis    Dizziness 03/01/2015   GERD (gastroesophageal reflux disease)    Hiatal hernia    Hypercholesterolemia 03/01/2015   Hyperlipidemia    Hypertension    Ingrown toenail 08/10/2017   Inguinal hernia    Migraine headache    Onychomycosis 12/08/2017   Paresthesia of arm 03/01/2015   Schatzki's ring    Tendonitis, Achilles, right 12/08/2017   Unilateral primary osteoarthritis, left knee 10/07/2017    Past Surgical History:  Procedure Laterality Date   ABDOMINAL HYSTERECTOMY     BACK SURGERY     x 3   CARPAL TUNNEL RELEASE     left wrist   CERVICAL SPINE SURGERY     HEMORRHOID SURGERY      Current Outpatient Medications  Medication Instructions   acetaminophen (TYLENOL) 650 mg, Oral, For headaches   alum & mag hydroxide-simeth (MAALOX MAX) 400-400-40 MG/5ML suspension 10 mLs, Oral, Every 6 hours PRN   Calcium Carb-Cholecalciferol (CALCIUM 600 + D PO) 1 tablet, Oral, Daily   carvedilol (COREG) 6.25 mg,  Oral, 2 times daily with meals   EPINEPHrine (EPIPEN 2-PAK) 0.3 mg, Intramuscular, As needed, Per allergen immunotherapy protocol   ethacrynic acid (EDECRIN) 50 mg, Oral, Daily   fluticasone (FLONASE) 50 MCG/ACT nasal spray USE 2 SPRAYS IN NOSTRIL(S) DAILY AS NEEDED.  In the right nostril point the applicator out toward your right ear.  In the left nostril point the applicator out toward her left ear   losartan (COZAAR) 100 mg, Oral, Daily   METAMUCIL FIBER PO Oral   Multiple Vitamins-Minerals (MULTIVITAL) tablet 1 tablet, Oral, Daily   nortriptyline (PAMELOR) 50 mg, Oral, Daily at bedtime   omeprazole (PRILOSEC) 40 mg, Oral, Daily   potassium chloride SA (KLOR-CON M) 20 MEQ tablet TAKE 1 TABLET BY MOUTH EVERY DAY   rosuvastatin (CRESTOR) 10 MG tablet TAKE 1 TABLET BY MOUTH EVERY DAY   traMADol (ULTRAM) 50-100 mg, Oral, Every 6 hours PRN       Objective:   Physical Exam BP (!) 144/76 (BP Location: Left Arm, Patient Position: Sitting, Cuff Size: Normal)    Pulse 83    Temp 98.2 F (36.8 C) (Oral)    Resp 18    Ht 5\' 5"  (1.651 m)    Wt 209 lb 2 oz (94.9 kg)    SpO2 96%    BMI 34.80 kg/m  General:   Well developed, NAD, BMI noted. HEENT:  Normocephalic . Face symmetric, atraumatic Lungs:  CTA B Normal respiratory effort, no intercostal retractions, no accessory muscle use. Heart: RRR,  no murmur.  Lower extremities: She does have a sock mark but otherwise she has trace pretibial edema Skin: Not pale. Not jaundice Neurologic:  alert & oriented X3.  Speech normal, gait appropriate for age and unassisted Psych--  Cognition and judgment appear intact.  Cooperative with normal attention span and concentration.  Behavior appropriate. No anxious or depressed appearing.      Assessment      76 year old female, PMH includes   high cholesterol, HTN, CAD? >>>  Negative cardiac catheterization 2020, allergic to sulfa, presents with:  Lower extremity edema, hypertension: Since the last  office visit, BMP and  BNP were okay.  Pt did not pursue chest x-ray. Good compliance with medication. Fortunately her blood pressure is doing much better, typically 120-130/ 60-50.  Heart rate usually 70-80. Still c/o  of edema which is minimal.  Request to increase diuretics. She continue complaining of weight gain, I believe it is rather diet related not fluid retention. Plan: BMP Increased endocrine from 25 mg to 50 mg and reassess in 2 to 3 weeks Continue carvedilol, KCl, losartan. Heathy diet  RTC to see PCP in 2-week   This visit occurred during the SARS-CoV-2 public health emergency.  Safety protocols were in place, including screening questions prior to the visit, additional usage of staff PPE, and extensive cleaning of exam room while observing appropriate contact time as indicated for disinfecting solutions.

## 2021-02-23 NOTE — Patient Instructions (Addendum)
Check the  blood pressure regularly as you are doing BP GOAL is between 110/65 and  135/85. If it is consistently higher or lower, let me know  Increase Edecrin 25 mg: Take 2 tablets every morning Other medications the same  Watch your salt intake  Try to eat healthier with less carbohydrates (pasta, bread, potato, rice), less candy and processed foods.   GO TO THE LAB : Get the blood work     [place], [place] back for a checkup with Dr. Wylene Simmer in 2 or 3 weeks

## 2021-02-24 LAB — BASIC METABOLIC PANEL
BUN: 15 mg/dL (ref 7–25)
CO2: 31 mmol/L (ref 20–32)
Calcium: 8.9 mg/dL (ref 8.6–10.4)
Chloride: 104 mmol/L (ref 98–110)
Creat: 0.85 mg/dL (ref 0.60–1.00)
Glucose, Bld: 91 mg/dL (ref 65–99)
Potassium: 3.5 mmol/L (ref 3.5–5.3)
Sodium: 143 mmol/L (ref 135–146)

## 2021-02-24 LAB — HEMOGLOBIN A1C
Hgb A1c MFr Bld: 5.9 % of total Hgb — ABNORMAL HIGH (ref <5.7)
Mean Plasma Glucose: 123 mg/dL
eAG (mmol/L): 6.8 mmol/L

## 2021-02-26 ENCOUNTER — Ambulatory Visit (INDEPENDENT_AMBULATORY_CARE_PROVIDER_SITE_OTHER): Payer: Medicare Other

## 2021-02-26 DIAGNOSIS — J309 Allergic rhinitis, unspecified: Secondary | ICD-10-CM

## 2021-02-28 ENCOUNTER — Other Ambulatory Visit: Payer: Self-pay | Admitting: Internal Medicine

## 2021-03-01 ENCOUNTER — Ambulatory Visit: Payer: Medicare Other

## 2021-03-04 NOTE — Progress Notes (Signed)
[facility] [address], [place], [place] [address] [phone]  Date:  03/07/2021   Name:  Stephanie Parks   DOB:  December 18, 1945   MRN:  [id number]  PCP:  [name], MD    Chief Complaint: Follow-up (02/23/21 OV with JP: Pt was advised to increase Edecrin 25 mg: Take 2 tablets every morning./Concerns/ questions: pt says she has started taking her Carvedilol only once a day due to edema and ShOB.)   History of Present Illness:  Stephanie Parks is a 76 y.o. very pleasant female patient who presents with the following:  Patient seen today for periodic follow-up-  hypertension, hyperlipidemia, allergic rhinitis, OSA on CPAP. She has tended to have frequent ER visits for chest pain likely related to anxiety She also had [id number] complicated by pulmonary embolism in January of last year.  She has completed treatment with Eliquis and is no longer using oxygen  Most recent visit with myself was in September She has also seen my partner Dr. Larose Kells twice in the interim-December 20 and January 6 with concern of hypertension  At last visit on January 6 her blood pressure looked good but she was concerned about swelling-Dr. Larose Kells increased her Edecrine from 25 to 50 mg and asked her to see me in 2 to 3 weeks Pt notes the medication was expensive when she went to refill it (probably because of the new year and re-set of her out of pocket) so she did not get the new prescription, has continued to take previous dose  She has been checking her vitals- BP running 127- 145/ 52- 78  BMP done January 6, looked okay  Patient got 2 COVID vaccines and has declined any other vaccinations including flu or pneumonia   Wt Readings from Last 3 Encounters:  03/07/21 208 lb 9.6 oz (94.6 kg)  02/23/21 209 lb 2 oz (94.9 kg)  02/06/21 207 lb (93.9 kg)    Weight has gone up- in September she was 193 She did eat more over the holidays but nothing  major -she is concerned about this unexpected weight gain  She is planning for a left hip replacement at some point   Echo a year ago  1. Left ventricular ejection fraction, by estimation, is 55 to 60%. The  left ventricle has normal function. The left ventricle has no regional  wall motion abnormalities. There is moderate left ventricular hypertrophy.  Left ventricular diastolic  parameters are consistent with Grade I diastolic dysfunction (impaired  relaxation).   She has felt SOB for about 2 weeks No chest pain this time-however patient later goes back and states she has occasional sharp pains in her left chest.  Most recently occurred yesterday.  Does not follow any particular pattern, consistent with pain she has had for years Not couging in particular  She has felt lightheaded- which is not really new- no fever, no body aches She has noted chills  She is concerned about having COVID again, as above last time she had COVID-19 her illness was complicated by pulmonary embolism and oxygen requirement  Patient Active Problem List   Diagnosis Date Noted   Unilateral primary osteoarthritis, left hip 10/18/2020   Greater trochanteric bursitis of left hip 10/18/2020   Headache 06/07/2020   Acute respiratory failure with hypoxia ([place]) 05/02/2020   Post-COVID chronic dyspnea 05/02/2020   Pulsatile neck mass 04/26/2020   Pneumonia due to COVID-19 virus 03/12/2020  Headache disorder 01/04/2020   Seasonal allergic conjunctivitis 10/04/2019   Dysfunction of right eustachian tube 10/04/2019   Pre-diabetes 06/02/2019   Seasonal and perennial allergic rhinitis 12/22/2018   Heart palpitations 12/22/2018   History of chest pain 07/20/2018   Brain aneurysm 07/20/2018   Dyslipidemia 05/04/2018   Tendonitis, Achilles, right 12/08/2017   Unilateral primary osteoarthritis, left knee 10/07/2017   Ingrown toenail 08/10/2017   Coughing 04/24/2017   CAD (coronary artery disease) 03/01/2015    Aneurysm ([place]) 03/01/2015   Dizziness 03/01/2015   Paresthesia of arm 03/01/2015   Achalasia 09/28/2012   CN (constipation) 09/28/2012   Cephalalgia 08/24/2012   Essential hypertension 04/19/2011   Hyperlipidemia 04/19/2011   GERD (gastroesophageal reflux disease) 04/19/2011   Chest pain 04/04/2011    Past Medical History:  Diagnosis Date   3-vessel CAD 03/01/2015   Achalasia 09/28/2012   Allergic rhinitis 03/01/2015   Anemia    Aneurysm ([place]) 03/01/2015   BP (high blood pressure) 03/01/2015   Bronchitis 04/23/2018   Cephalalgia 08/24/2012   Overview:  ICD-10 cut over     Chest pain 04/04/2011   CN (constipation) 09/28/2012   Coughing 04/24/2017   Decreased potassium in the blood 03/01/2015   Diverticulosis    Dizziness 03/01/2015   GERD (gastroesophageal reflux disease)    Hiatal hernia    Hypercholesterolemia 03/01/2015   Hyperlipidemia    Hypertension    Ingrown toenail 08/10/2017   Inguinal hernia    Migraine headache    Onychomycosis 12/08/2017   Paresthesia of arm 03/01/2015   Schatzki's ring    Tendonitis, Achilles, right 12/08/2017   Unilateral primary osteoarthritis, left knee 10/07/2017    Past Surgical History:  Procedure Laterality Date   ABDOMINAL HYSTERECTOMY     BACK SURGERY     x 3   CARPAL TUNNEL RELEASE     left wrist   CERVICAL SPINE SURGERY     HEMORRHOID SURGERY      Social History   Tobacco Use   Smoking status: Former    Packs/day: 0.50    Years: 20.00    Pack years: 10.00    Types: Cigarettes    Quit date: 02/19/1984    Years since quitting: 37.0   Smokeless tobacco: Never  Vaping Use   Vaping Use: Never used  Substance Use Topics   Alcohol use: No   Drug use: No    Family History  Problem Relation Age of Onset   Allergic rhinitis Sister    Diabetes Sister    Hypertension Sister    Heart attack Father 14   Heart disease Father    Stomach cancer Maternal Grandmother    Heart disease Mother    Colon cancer Neg Hx    Angioedema Neg Hx     Asthma Neg Hx    Eczema Neg Hx    Urticaria Neg Hx    Immunodeficiency Neg Hx    Breast cancer Neg Hx    Migraines Neg Hx    Headache Neg Hx     Allergies  Allergen Reactions   Shellfish Allergy Hives and Swelling   Ace Inhibitors Other (See Comments)    Pt cannot recall her reaction but tolerates arb    Gabapentin Other (See Comments)    headaches   Pitavastatin     Unknown reaction    Topiramate     Heart race,    Sulfa Antibiotics Other (See Comments) and Rash    Fine bumps    Medication  list has been reviewed and updated.  Current Outpatient Medications on File Prior to Visit  Medication Sig Dispense Refill   acetaminophen (TYLENOL) 325 MG tablet Take 650 mg by mouth. For headaches     alum & mag hydroxide-simeth (MAALOX MAX) 400-400-40 MG/5ML suspension Take 10 mLs by mouth every 6 (six) hours as needed for indigestion. 355 mL 0   Calcium Carb-Cholecalciferol (CALCIUM 600 + D PO) Take 1 tablet by mouth daily.     carvedilol (COREG) 6.25 MG tablet Take 1 tablet (6.25 mg total) by mouth 2 (two) times daily with a meal. 60 tablet 1   EPINEPHrine (EPIPEN 2-PAK) 0.3 mg/0.3 mL IJ SOAJ injection Inject 0.3 mLs (0.3 mg total) into the muscle as needed for anaphylaxis. Per allergen immunotherapy protocol 1 each 2   ethacrynic acid (EDECRIN) 25 MG tablet Take 2 tablets (50 mg total) by mouth daily. 60 tablet 1   fluticasone (FLONASE) 50 MCG/ACT nasal spray USE 2 SPRAYS IN NOSTRIL(S) DAILY AS NEEDED.  In the right nostril point the applicator out toward your right ear.  In the left nostril point the applicator out toward her left ear (Patient taking differently: Place 2 sprays into both nostrils daily as needed for allergies.) 16 g 9   losartan (COZAAR) 100 MG tablet Take 1 tablet (100 mg total) by mouth daily. 90 tablet 1   METAMUCIL FIBER PO Take by mouth.     Multiple Vitamins-Minerals (MULTIVITAL) tablet Take 1 tablet by mouth daily.      nortriptyline (PAMELOR) 25 MG capsule  Take 2 capsules (50 mg total) by mouth at bedtime. 180 capsule 3   omeprazole (PRILOSEC) 40 MG capsule Take 40 mg by mouth daily.     potassium chloride SA (KLOR-CON M) 20 MEQ tablet TAKE 1 TABLET BY MOUTH EVERY DAY 180 tablet 1   rosuvastatin (CRESTOR) 10 MG tablet TAKE 1 TABLET BY MOUTH EVERY DAY 90 tablet 3   traMADol (ULTRAM) 50 MG tablet Take 1-2 tablets (50-100 mg total) by mouth every 6 (six) hours as needed. 30 tablet 0   No current facility-administered medications on file prior to visit.    Review of Systems:  As per HPI- otherwise negative.   Physical Examination: Vitals:   03/07/21 1017  BP: (!) 144/70  Pulse: 94  Resp: 18  Temp: 98.1 F (36.7 C)  SpO2: 99%   Vitals:   03/07/21 1017  Weight: 208 lb 9.6 oz (94.6 kg)  Height: 5\' 5"  (1.651 m)   Body mass index is 34.71 kg/m. Ideal Body Weight: Weight in (lb) to have BMI = 25: 149.9  GEN: no acute distress.  Overweight, appears her normal self HEENT: Atraumatic, Normocephalic.  Ears and Nose: No external deformity. CV: RRR, No M/G/R. No JVD. No thrill. No extra heart sounds. PULM: CTA B, no wheezes, crackles, rhonchi. No retractions. No resp. distress. No accessory muscle use. ABD: S, NT, ND, +BS. No rebound. No HSM. EXTR: No c/c/she has mild edema in the right ankle which is stable.  Left ankle is normal PSYCH: Normally interactive. Conversant.   EKG: SR,  compared with tracing from 11/22 there is decreased voltage in V2/3- possible pulmonary disease  Assessment and Plan: SOB (shortness of breath) - Plan: B Nat Peptide, DG Chest 2 View, D-Dimer, Quantitative, POC COVID-19 BinaxNow, Comprehensive metabolic panel, EKG 46-TKPT, Troponin I (High Sensitivity), CT Angio Chest W/Cm &/Or Wo Cm  Weight gain - Plan: TSH  Essential hypertension - Plan: TSH, CBC, Comprehensive metabolic  panel  Generalized edema  Chest pain, unspecified type - Plan: CT Angio Chest W/Cm &/Or Wo Cm  History of pulmonary embolus (PE) -  Plan: CT Angio Chest W/Cm &/Or Wo Cm  Patient seen today for follow-up.  She tends to have history of atypical chest pain.  She once again notices this pain, most recently occurred yesterday.  Not active currently She also notes shortness of breath, as well as weight gain. Will obtain a chest film and BNP to evaluate for possible CHF exacerbation As above, history of pulmonary embolism the last time she had COVID-19.  We will obtain a D-dimer today. She has noted recent chest pain which is not unusual for her.  We will check a troponin today  Signed Lamar Blinks, MD  Received her chest x-ray and D-dimer.  Called patient due to positive D-dimer.  She is okay with proceeding to CT angiogram  DG Chest 2 View  Result Date: 03/07/2021 CLINICAL DATA:  76 year old female with shortness of breath for 2 weeks and cough. Former smoker. EXAM: CHEST - 2 VIEW COMPARISON:  Chest radiographs 01/03/2021 and earlier. FINDINGS: Lung volumes and mediastinal contours remain within normal limits. No pneumothorax, pulmonary edema, pleural effusion or acute pulmonary opacity. Mild diffuse increased interstitial markings appear stable from last year. Visualized tracheal air column is within normal limits. Prior cervical ACDF. No acute osseous abnormality identified. Negative visible bowel gas. IMPRESSION: No acute cardiopulmonary abnormality. Electronically Signed   By: Genevie Ann M.D.   On: 03/07/2021 11:37    Received other lab work as below, message to patient Results for orders placed or performed in visit on 03/07/21  TSH  Result Value Ref Range   TSH 2.93 0.35 - 5.50 uIU/mL  B Nat Peptide  Result Value Ref Range   Pro B Natriuretic peptide (BNP) 38.0 0.0 - 100.0 pg/mL  CBC  Result Value Ref Range   WBC 5.9 4.0 - 10.5 K/uL   RBC 3.86 (L) 3.87 - 5.11 Mil/uL   Platelets 282.0 150.0 - 400.0 K/uL   Hemoglobin 10.8 (L) 12.0 - 15.0 g/dL   HCT 33.6 (L) 36.0 - 46.0 %   MCV 87.0 78.0 - 100.0 fl   MCHC 32.3 30.0  - 36.0 g/dL   RDW 15.3 11.5 - 15.5 %  D-Dimer, Quantitative  Result Value Ref Range   D-Dimer, Quant 0.93 (H) <0.50 mcg/mL FEU  Comprehensive metabolic panel  Result Value Ref Range   Sodium 142 135 - 145 mEq/L   Potassium 3.8 3.5 - 5.1 mEq/L   Chloride 102 96 - 112 mEq/L   CO2 33 (H) 19 - 32 mEq/L   Glucose, Bld 93 70 - 99 mg/dL   BUN 14 6 - 23 mg/dL   Creatinine, Ser 0.86 0.40 - 1.20 mg/dL   Total Bilirubin 0.4 0.2 - 1.2 mg/dL   Alkaline Phosphatase 77 39 - 117 U/L   AST 18 0 - 37 U/L   ALT 12 0 - 35 U/L   Total Protein 6.4 6.0 - 8.3 g/dL   Albumin 3.7 3.5 - 5.2 g/dL   GFR 66.09 >60.00 mL/min   Calcium 8.8 8.4 - 10.5 mg/dL  POC COVID-19 BinaxNow  Result Value Ref Range   SARS Coronavirus 2 Ag Negative Negative  Troponin I (High Sensitivity)  Result Value Ref Range   High Sens Troponin I 7 2 - 17 ng/L

## 2021-03-07 ENCOUNTER — Ambulatory Visit (INDEPENDENT_AMBULATORY_CARE_PROVIDER_SITE_OTHER): Payer: Medicare Other

## 2021-03-07 ENCOUNTER — Ambulatory Visit (INDEPENDENT_AMBULATORY_CARE_PROVIDER_SITE_OTHER): Payer: Medicare Other | Admitting: Family Medicine

## 2021-03-07 ENCOUNTER — Ambulatory Visit (HOSPITAL_BASED_OUTPATIENT_CLINIC_OR_DEPARTMENT_OTHER)
Admission: RE | Admit: 2021-03-07 | Discharge: 2021-03-07 | Disposition: A | Discharge status: Home / Self Care | Payer: Medicare Other | Source: Ambulatory Visit | Attending: Family Medicine | Admitting: Family Medicine

## 2021-03-07 ENCOUNTER — Encounter: Payer: Self-pay | Admitting: Family Medicine

## 2021-03-07 ENCOUNTER — Other Ambulatory Visit: Payer: Self-pay

## 2021-03-07 VITALS — BP 144/70 | HR 94 | Temp 98.1°F | Resp 18 | Ht 65.0 in | Wt 208.6 lb

## 2021-03-07 DIAGNOSIS — R601 Generalized edema: Secondary | ICD-10-CM | POA: Diagnosis not present

## 2021-03-07 DIAGNOSIS — R079 Chest pain, unspecified: Secondary | ICD-10-CM | POA: Diagnosis not present

## 2021-03-07 DIAGNOSIS — R0602 Shortness of breath: Secondary | ICD-10-CM | POA: Diagnosis not present

## 2021-03-07 DIAGNOSIS — Z86711 Personal history of pulmonary embolism: Secondary | ICD-10-CM

## 2021-03-07 DIAGNOSIS — J309 Allergic rhinitis, unspecified: Secondary | ICD-10-CM | POA: Diagnosis not present

## 2021-03-07 DIAGNOSIS — I10 Essential (primary) hypertension: Secondary | ICD-10-CM

## 2021-03-07 DIAGNOSIS — D649 Anemia, unspecified: Secondary | ICD-10-CM

## 2021-03-07 DIAGNOSIS — R635 Abnormal weight gain: Secondary | ICD-10-CM

## 2021-03-07 DIAGNOSIS — R059 Cough, unspecified: Secondary | ICD-10-CM | POA: Diagnosis not present

## 2021-03-07 LAB — COMPREHENSIVE METABOLIC PANEL
ALT: 12 U/L (ref 0–35)
AST: 18 U/L (ref 0–37)
Albumin: 3.7 g/dL (ref 3.5–5.2)
Alkaline Phosphatase: 77 U/L (ref 39–117)
BUN: 14 mg/dL (ref 6–23)
CO2: 33 mEq/L — ABNORMAL HIGH (ref 19–32)
Calcium: 8.8 mg/dL (ref 8.4–10.5)
Chloride: 102 mEq/L (ref 96–112)
Creatinine, Ser: 0.86 mg/dL (ref 0.40–1.20)
GFR: 66.09 mL/min (ref >60.00)
Glucose, Bld: 93 mg/dL (ref 70–99)
Potassium: 3.8 mEq/L (ref 3.5–5.1)
Sodium: 142 mEq/L (ref 135–145)
Total Bilirubin: 0.4 mg/dL (ref 0.2–1.2)
Total Protein: 6.4 g/dL (ref 6.0–8.3)

## 2021-03-07 LAB — POC COVID19 BINAXNOW: SARS Coronavirus 2 Ag: NEGATIVE

## 2021-03-07 LAB — BRAIN NATRIURETIC PEPTIDE: Pro B Natriuretic peptide (BNP): 38 pg/mL (ref 0.0–100.0)

## 2021-03-07 LAB — CBC
HCT: 33.6 % — ABNORMAL LOW (ref 36.0–46.0)
Hemoglobin: 10.8 g/dL — ABNORMAL LOW (ref 12.0–15.0)
MCHC: 32.3 g/dL (ref 30.0–36.0)
MCV: 87 fl (ref 78.0–100.0)
Platelets: 282 10*3/uL (ref 150.0–400.0)
RBC: 3.86 Mil/uL — ABNORMAL LOW (ref 3.87–5.11)
RDW: 15.3 % (ref 11.5–15.5)
WBC: 5.9 10*3/uL (ref 4.0–10.5)

## 2021-03-07 LAB — TSH: TSH: 2.93 u[IU]/mL (ref 0.35–5.50)

## 2021-03-07 LAB — TROPONIN I (HIGH SENSITIVITY): High Sens Troponin I: 7 ng/L (ref 2–17)

## 2021-03-07 LAB — D-DIMER, QUANTITATIVE: D-Dimer, Quant: 0.93 mcg/mL FEU — ABNORMAL HIGH (ref <0.50)

## 2021-03-07 NOTE — Patient Instructions (Signed)
Good to see you today I think your BP looks ok on current regimen I will be in touch with your labs and x-ray asap!   Negative for covid today

## 2021-03-08 ENCOUNTER — Telehealth: Payer: Self-pay | Admitting: *Deleted

## 2021-03-09 ENCOUNTER — Encounter: Payer: Self-pay | Admitting: Family Medicine

## 2021-03-09 ENCOUNTER — Other Ambulatory Visit: Payer: Self-pay

## 2021-03-09 ENCOUNTER — Ambulatory Visit (HOSPITAL_BASED_OUTPATIENT_CLINIC_OR_DEPARTMENT_OTHER)
Admission: RE | Admit: 2021-03-09 | Discharge: 2021-03-09 | Disposition: A | Discharge status: Home / Self Care | Payer: Medicare Other | Source: Ambulatory Visit | Attending: Family Medicine | Admitting: Family Medicine

## 2021-03-09 DIAGNOSIS — R079 Chest pain, unspecified: Secondary | ICD-10-CM | POA: Diagnosis not present

## 2021-03-09 DIAGNOSIS — Z86711 Personal history of pulmonary embolism: Secondary | ICD-10-CM | POA: Diagnosis not present

## 2021-03-09 DIAGNOSIS — R0602 Shortness of breath: Secondary | ICD-10-CM | POA: Diagnosis not present

## 2021-03-09 DIAGNOSIS — J8410 Pulmonary fibrosis, unspecified: Secondary | ICD-10-CM | POA: Diagnosis not present

## 2021-03-09 DIAGNOSIS — K449 Diaphragmatic hernia without obstruction or gangrene: Secondary | ICD-10-CM | POA: Diagnosis not present

## 2021-03-09 MED ORDER — IOHEXOL 350 MG/ML SOLN
75.0000 mL | Freq: Once | INTRAVENOUS | Status: AC | PRN
  Administered 2021-03-09: 75 mL via INTRAVENOUS

## 2021-03-09 NOTE — Telephone Encounter (Signed)
Called pt- no answer.  LMOM- please call imaging to schedule CT angiogram

## 2021-03-09 NOTE — Telephone Encounter (Signed)
CT scheduled for 5 pm today- pt updated

## 2021-03-12 ENCOUNTER — Other Ambulatory Visit: Payer: Self-pay

## 2021-03-12 ENCOUNTER — Encounter: Payer: Self-pay | Admitting: Hematology & Oncology

## 2021-03-12 ENCOUNTER — Inpatient Hospital Stay: Payer: Medicare Other | Attending: Hematology & Oncology

## 2021-03-12 ENCOUNTER — Inpatient Hospital Stay (HOSPITAL_BASED_OUTPATIENT_CLINIC_OR_DEPARTMENT_OTHER): Payer: Medicare Other | Admitting: Hematology & Oncology

## 2021-03-12 ENCOUNTER — Ambulatory Visit (HOSPITAL_BASED_OUTPATIENT_CLINIC_OR_DEPARTMENT_OTHER): Payer: Medicare Other

## 2021-03-12 VITALS — BP 133/64 | HR 83 | Temp 98.8°F | Resp 16 | Wt 207.0 lb

## 2021-03-12 DIAGNOSIS — D649 Anemia, unspecified: Secondary | ICD-10-CM | POA: Insufficient documentation

## 2021-03-12 DIAGNOSIS — Z87891 Personal history of nicotine dependence: Secondary | ICD-10-CM | POA: Insufficient documentation

## 2021-03-12 DIAGNOSIS — Z79899 Other long term (current) drug therapy: Secondary | ICD-10-CM

## 2021-03-12 DIAGNOSIS — D500 Iron deficiency anemia secondary to blood loss (chronic): Secondary | ICD-10-CM

## 2021-03-12 DIAGNOSIS — D508 Other iron deficiency anemias: Secondary | ICD-10-CM

## 2021-03-12 DIAGNOSIS — E611 Iron deficiency: Secondary | ICD-10-CM | POA: Diagnosis not present

## 2021-03-12 DIAGNOSIS — D631 Anemia in chronic kidney disease: Secondary | ICD-10-CM

## 2021-03-12 LAB — RETICULOCYTES
Immature Retic Fract: 11.1 % (ref 2.3–15.9)
RBC.: 3.85 MIL/uL — ABNORMAL LOW (ref 3.87–5.11)
Retic Count, Absolute: 54.7 10*3/uL (ref 19.0–186.0)
Retic Ct Pct: 1.4 % (ref 0.4–3.1)

## 2021-03-12 LAB — CMP (CANCER CENTER ONLY)
ALT: 13 U/L (ref 0–44)
AST: 20 U/L (ref 15–41)
Albumin: 3.9 g/dL (ref 3.5–5.0)
Alkaline Phosphatase: 79 U/L (ref 38–126)
Anion gap: 5 (ref 5–15)
BUN: 15 mg/dL (ref 8–23)
CO2: 34 mmol/L — ABNORMAL HIGH (ref 22–32)
Calcium: 9.7 mg/dL (ref 8.9–10.3)
Chloride: 102 mmol/L (ref 98–111)
Creatinine: 0.92 mg/dL (ref 0.44–1.00)
GFR, Estimated: >60 mL/min (ref >60)
Glucose, Bld: 89 mg/dL (ref 70–99)
Potassium: 3.8 mmol/L (ref 3.5–5.1)
Sodium: 141 mmol/L (ref 135–145)
Total Bilirubin: 0.3 mg/dL (ref 0.3–1.2)
Total Protein: 6.8 g/dL (ref 6.5–8.1)

## 2021-03-12 LAB — CBC WITH DIFFERENTIAL (CANCER CENTER ONLY)
Abs Immature Granulocytes: 0.05 10*3/uL (ref 0.00–0.07)
Basophils Absolute: 0 10*3/uL (ref 0.0–0.1)
Basophils Relative: 0 %
Eosinophils Absolute: 0.3 10*3/uL (ref 0.0–0.5)
Eosinophils Relative: 4 %
HCT: 34.2 % — ABNORMAL LOW (ref 36.0–46.0)
Hemoglobin: 11 g/dL — ABNORMAL LOW (ref 12.0–15.0)
Immature Granulocytes: 1 %
Lymphocytes Relative: 39 %
Lymphs Abs: 2.8 10*3/uL (ref 0.7–4.0)
MCH: 28.4 pg (ref 26.0–34.0)
MCHC: 32.2 g/dL (ref 30.0–36.0)
MCV: 88.4 fL (ref 80.0–100.0)
Monocytes Absolute: 0.5 10*3/uL (ref 0.1–1.0)
Monocytes Relative: 7 %
Neutro Abs: 3.5 10*3/uL (ref 1.7–7.7)
Neutrophils Relative %: 49 %
Platelet Count: 293 10*3/uL (ref 150–400)
RBC: 3.87 MIL/uL (ref 3.87–5.11)
RDW: 14.6 % (ref 11.5–15.5)
WBC Count: 7.2 10*3/uL (ref 4.0–10.5)
nRBC: 0 % (ref 0.0–0.2)

## 2021-03-12 LAB — SAVE SMEAR(SSMR), FOR PROVIDER SLIDE REVIEW

## 2021-03-12 NOTE — Progress Notes (Signed)
Referral MD  Reason for Referral: Normochromic, normocytic anemia  Chief Complaint  Patient presents with   New Patient (Initial Visit)  : I just do not feel well.  HPI: [name] is a very nice 76 year old African-American female.  She lives in [place].  She works at [employer] as a Scientist, water quality..  She has numerous health issues.  She had COVID a year ago.  She says since then, she is never felt good.  She has a chronic mild anemia.  Her last colonoscopy was back in 2020.  Everything looks fine.  Showed some polyps that were tubular adenomas.  She does not have sickle cell disease.  Her big problem right now is that she needs hip surgery for the left hip.  However, she has had blood pressure problems and she says that the Orthopedic surgeon is not going to operate on her until her blood pressure stabilizes.  Of note, come back to 2011, she was anemic.  At that time, her white cell count was 7.4.  Hemoglobin 11.8.  Platelet count 323,000.  MCV was 87.  In April 2021, her white cell count was 7.0.  Hemoglobin 11.0.  Platelet count 330,000.  Blood sinuses I see just a ferritin that was done on 03/21/2020.  This was normal at 258.  In January 2022, her vitamin B12 was 1263.  She has had multiple surgeries.  She has had a hysterectomy.  She smoked but stopped in 1986.  She has not lost weight.  She has had a good appetite.  She is not a vegetarian.  She was, referred to the [facility] for an evaluation.  I really do not think that her mild anemia really is a source of all of her issues.  Currently, I would say performance status is probably ECOG 1.  Past Medical History:  Diagnosis Date   3-vessel CAD 03/01/2015   Achalasia 09/28/2012   Allergic rhinitis 03/01/2015   Anemia    Aneurysm ([place]) 03/01/2015   BP (high blood pressure) 03/01/2015   Bronchitis 04/23/2018   Cephalalgia 08/24/2012   Overview:  ICD-10 cut over     Chest pain 04/04/2011   CN  (constipation) 09/28/2012   Coughing 04/24/2017   Decreased potassium in the blood 03/01/2015   Diverticulosis    Dizziness 03/01/2015   GERD (gastroesophageal reflux disease)    Hiatal hernia    Hypercholesterolemia 03/01/2015   Hyperlipidemia    Hypertension    Ingrown toenail 08/10/2017   Inguinal hernia    Migraine headache    Onychomycosis 12/08/2017   Paresthesia of arm 03/01/2015   Schatzki's ring    Tendonitis, Achilles, right 12/08/2017   Unilateral primary osteoarthritis, left knee 10/07/2017  :   Past Surgical History:  Procedure Laterality Date   ABDOMINAL HYSTERECTOMY     BACK SURGERY     x 3   CARPAL TUNNEL RELEASE     left wrist   CERVICAL SPINE SURGERY     HEMORRHOID SURGERY    :   Current Outpatient Medications:    acetaminophen (TYLENOL) 325 MG tablet, Take 650 mg by mouth. For headaches, Disp: , Rfl:    alum & mag hydroxide-simeth (MAALOX MAX) 400-400-40 MG/5ML suspension, Take 10 mLs by mouth every 6 (six) hours as needed for indigestion., Disp: 355 mL, Rfl: 0   Calcium Carb-Cholecalciferol (CALCIUM 600 + D PO), Take 1 tablet by mouth daily., Disp: , Rfl:    carvedilol (COREG) 6.25 MG tablet, Take 1  tablet (6.25 mg total) by mouth 2 (two) times daily with a meal., Disp: 60 tablet, Rfl: 1   EPINEPHrine (EPIPEN 2-PAK) 0.3 mg/0.3 mL IJ SOAJ injection, Inject 0.3 mLs (0.3 mg total) into the muscle as needed for anaphylaxis. Per allergen immunotherapy protocol, Disp: 1 each, Rfl: 2   ethacrynic acid (EDECRIN) 25 MG tablet, Take 2 tablets (50 mg total) by mouth daily., Disp: 60 tablet, Rfl: 1   fluticasone (FLONASE) 50 MCG/ACT nasal spray, USE 2 SPRAYS IN NOSTRIL(S) DAILY AS NEEDED.  In the right nostril point the applicator out toward your right ear.  In the left nostril point the applicator out toward her left ear (Patient taking differently: Place 2 sprays into both nostrils daily as needed for allergies.), Disp: 16 g, Rfl: 9   losartan (COZAAR) 100 MG tablet, Take 1  tablet (100 mg total) by mouth daily., Disp: 90 tablet, Rfl: 1   METAMUCIL FIBER PO, Take by mouth., Disp: , Rfl:    Multiple Vitamins-Minerals (MULTIVITAL) tablet, Take 1 tablet by mouth daily. , Disp: , Rfl:    nortriptyline (PAMELOR) 25 MG capsule, Take 2 capsules (50 mg total) by mouth at bedtime., Disp: 180 capsule, Rfl: 3   omeprazole (PRILOSEC) 40 MG capsule, Take 40 mg by mouth daily., Disp: , Rfl:    potassium chloride SA (KLOR-CON M) 20 MEQ tablet, TAKE 1 TABLET BY MOUTH EVERY DAY, Disp: 180 tablet, Rfl: 1   rosuvastatin (CRESTOR) 10 MG tablet, TAKE 1 TABLET BY MOUTH EVERY DAY, Disp: 90 tablet, Rfl: 3   traMADol (ULTRAM) 50 MG tablet, Take 1-2 tablets (50-100 mg total) by mouth every 6 (six) hours as needed., Disp: 30 tablet, Rfl: 0:  :   Allergies  Allergen Reactions   Shellfish Allergy Hives and Swelling   Ace Inhibitors Other (See Comments)    Pt cannot recall her reaction but tolerates arb    Gabapentin Other (See Comments)    headaches   Pitavastatin     Unknown reaction    Topiramate     Heart race,    Sulfa Antibiotics Other (See Comments) and Rash    Fine bumps  :   Family History  Problem Relation Age of Onset   Allergic rhinitis Sister    Diabetes Sister    Hypertension Sister    Heart attack Father 55   Heart disease Father    Stomach cancer Maternal Grandmother    Heart disease Mother    Colon cancer Neg Hx    Angioedema Neg Hx    Asthma Neg Hx    Eczema Neg Hx    Urticaria Neg Hx    Immunodeficiency Neg Hx    Breast cancer Neg Hx    Migraines Neg Hx    Headache Neg Hx   :   Social History   Socioeconomic History   Marital status: Divorced    Spouse name: Not on file   Number of children: 2   Years of education: Not on file   Highest education level: Some college, no degree  Occupational History   Occupation: Surveyor, quantity: [id number]  Tobacco Use   Smoking status: Former    Packs/day: 0.50    Years: 20.00    Pack years: 10.00     Types: Cigarettes    Quit date: 02/19/1984    Years since quitting: 37.0   Smokeless tobacco: Never  Vaping Use   Vaping Use: Never used  Substance and Sexual Activity  Alcohol use: No   Drug use: No   Sexual activity: Yes    Partners: Male  Other Topics Concern   Not on file  Social History Narrative   Lives alone   Caffeine use: Coffee one cup daily   Right handed    Son is her next of kin   Working at [employer] Strain: Medium Risk   Difficulty of Paying Living Expenses: Somewhat hard  Food Insecurity: No Food Insecurity   Worried About Charity fundraiser in the Last Year: Never true   Arboriculturist in the Last Year: Never true  Transportation Needs: No Transportation Needs   Lack of Transportation (Medical): No   Lack of Transportation (Non-Medical): No  Physical Activity: Inactive   Days of Exercise per Week: 0 days   Minutes of Exercise per Session: 0 min  Stress: Not on file  Social Connections: Not on file  Intimate Partner Violence: Not on file  :  Review of Systems  Constitutional:  Positive for malaise/fatigue.  HENT: Negative.    Eyes: Negative.   Respiratory: Negative.    Cardiovascular:  Positive for palpitations and leg swelling.  Gastrointestinal:  Positive for diarrhea and nausea.  Genitourinary: Negative.   Musculoskeletal:  Positive for joint pain and myalgias.  Skin: Negative.   Neurological:  Positive for dizziness, tingling and focal weakness.  Endo/Heme/Allergies: Negative.   Psychiatric/Behavioral: Negative.      Exam: @IPVITALS @ Physical Exam Vitals reviewed.  HENT:     Head: Normocephalic and atraumatic.  Eyes:     Pupils: Pupils are equal, round, and reactive to light.  Cardiovascular:     Rate and Rhythm: Normal rate and regular rhythm.     Heart sounds: Normal heart sounds.  Pulmonary:     Effort: Pulmonary effort is normal.     Breath sounds: Normal breath sounds.   Abdominal:     General: Bowel sounds are normal.     Palpations: Abdomen is soft.  Musculoskeletal:        General: No tenderness or deformity. Normal range of motion.     Cervical back: Normal range of motion.  Lymphadenopathy:     Cervical: No cervical adenopathy.  Skin:    General: Skin is warm and dry.     Findings: No erythema or rash.  Neurological:     Mental Status: She is alert and oriented to person, place, and time.  Psychiatric:        Behavior: Behavior normal.        Thought Content: Thought content normal.        Judgment: Judgment normal.    Recent Labs    03/12/21 1332  WBC 7.2  HGB 11.0*  HCT 34.2*  PLT 293    Recent Labs    03/12/21 1332  NA 141  K 3.8  CL 102  CO2 34*  GLUCOSE 89  BUN 15  CREATININE 0.92  CALCIUM 9.7    Blood smear review: Normochromic and normocytic population of red blood cells.  I see no schistocytes.  There are no sickle cells.  She has no teardrop cells.  I see no nucleated red blood cells.  There is no rouleaux formation.  She has no target cells.  White blood cells appear normal in morphology maturation.  I do not see any hypersegmented polys.  There is no immature myeloid or lymphoid forms.  Platelets are adequate number and  size.  Pathology: None    Assessment and Plan: Ms. Canter is a very charming 76 year old Afro-American female.  She has mild anemia.  I suspect this might be anemia of low erythropoietin level.  We will see what her erythropoietin level is.  I would not think that she would be iron deficient.  I would not think that she has pernicious anemia.  I suppose that myelodysplasia could always be an issue at her age.  The blood smear certainly looked relatively benign for a myelodysplasia.  She has normal white cells and normal platelets.  She has normal white cell ratios.  I think that a lot of her health issues are not from the anemia.  Her anemia is very mild.  I realize that having COVID concerns a  cause long-term problems.  It looks like she really needs to have the surgery for her left hip.  I do not see a problem with her having this from my perspective.  We can always give her iron if her iron is low.  If her EPO level is low, we can always give her ESA.  I think we can be somewhat conservative with our approach.  Again I think a lot of her issues are more so from having COVID and probably represent some long-term complications of COVID.  We will get the results back from her labs, we will then plan to get her back and see how we can help and hopefully make her feel better.

## 2021-03-13 ENCOUNTER — Telehealth: Payer: Self-pay | Admitting: *Deleted

## 2021-03-13 ENCOUNTER — Encounter: Payer: Self-pay | Admitting: Hematology & Oncology

## 2021-03-13 ENCOUNTER — Other Ambulatory Visit: Payer: Self-pay | Admitting: Interventional Cardiology

## 2021-03-13 DIAGNOSIS — D509 Iron deficiency anemia, unspecified: Secondary | ICD-10-CM

## 2021-03-13 DIAGNOSIS — I10 Essential (primary) hypertension: Secondary | ICD-10-CM

## 2021-03-13 DIAGNOSIS — D631 Anemia in chronic kidney disease: Secondary | ICD-10-CM

## 2021-03-13 HISTORY — DX: Iron deficiency anemia, unspecified: D50.9

## 2021-03-13 HISTORY — DX: Anemia in chronic kidney disease: D63.1

## 2021-03-13 LAB — IRON AND IRON BINDING CAPACITY (CC-WL,HP ONLY)
Iron: 53 ug/dL (ref 28–170)
Saturation Ratios: 12 % (ref 10.4–31.8)
TIBC: 444 ug/dL (ref 250–450)
UIBC: 391 ug/dL (ref 148–442)

## 2021-03-13 LAB — ERYTHROPOIETIN: Erythropoietin: 26.3 m[IU]/mL — ABNORMAL HIGH (ref 2.6–18.5)

## 2021-03-13 LAB — FERRITIN: Ferritin: 11 ng/mL (ref 11–307)

## 2021-03-13 NOTE — Telephone Encounter (Signed)
-----   Message from Volanda Napoleon, MD sent at 03/13/2021 12:07 PM EST ----- Please call and let her know that the iron level is on the low side.  We really need to give her dose of IV iron.  Please set her up.

## 2021-03-13 NOTE — Telephone Encounter (Signed)
Per scheduling message Stanton Kidney, called and lvm of a call back to schedule (1) dose of IV Iron

## 2021-03-13 NOTE — Telephone Encounter (Signed)
Unable  to reach pt. LMOVM with results and to expect a call from scheduling regarding a IV Iron infusion. Msg to scheduling.

## 2021-03-13 NOTE — Addendum Note (Signed)
Addended by: Burney Gauze R on: 03/13/2021 03:51 PM   Modules accepted: Orders

## 2021-03-14 ENCOUNTER — Ambulatory Visit: Payer: Medicare Other

## 2021-03-14 DIAGNOSIS — H5711 Ocular pain, right eye: Secondary | ICD-10-CM | POA: Diagnosis not present

## 2021-03-14 DIAGNOSIS — H40021 Open angle with borderline findings, high risk, right eye: Secondary | ICD-10-CM | POA: Diagnosis not present

## 2021-03-14 LAB — HGB FRACTIONATION CASCADE
Hgb A2: 2.6 % (ref 1.8–3.2)
Hgb A: 97.4 % (ref 96.4–98.8)
Hgb F: 0 % (ref 0.0–2.0)
Hgb S: 0 %

## 2021-03-15 ENCOUNTER — Inpatient Hospital Stay: Payer: Medicare Other

## 2021-03-15 ENCOUNTER — Other Ambulatory Visit: Payer: Self-pay

## 2021-03-15 VITALS — BP 142/64 | HR 73 | Temp 98.2°F | Resp 17

## 2021-03-15 DIAGNOSIS — E611 Iron deficiency: Secondary | ICD-10-CM | POA: Diagnosis not present

## 2021-03-15 DIAGNOSIS — Z87891 Personal history of nicotine dependence: Secondary | ICD-10-CM | POA: Diagnosis not present

## 2021-03-15 DIAGNOSIS — D500 Iron deficiency anemia secondary to blood loss (chronic): Secondary | ICD-10-CM

## 2021-03-15 DIAGNOSIS — Z79899 Other long term (current) drug therapy: Secondary | ICD-10-CM | POA: Diagnosis not present

## 2021-03-15 DIAGNOSIS — D649 Anemia, unspecified: Secondary | ICD-10-CM | POA: Diagnosis not present

## 2021-03-15 MED ORDER — SODIUM CHLORIDE 0.9 % IV SOLN
1000.0000 mg | Freq: Once | INTRAVENOUS | Status: AC
  Administered 2021-03-15: 1000 mg via INTRAVENOUS
  Filled 2021-03-15: qty 10

## 2021-03-15 MED ORDER — SODIUM CHLORIDE 0.9 % IV SOLN: Freq: Once | INTRAVENOUS | Status: AC

## 2021-03-15 NOTE — Patient Instructions (Signed)

## 2021-03-20 NOTE — Progress Notes (Signed)
PATIENT: Stephanie Parks DOB: 1945/04/18  REASON FOR VISIT: follow up HISTORY FROM: patient  Chief Complaint  Patient presents with   Follow-up   Obstructive Sleep Apnea    RM 11, alone. Here for CPAP and migraine f/u. Pt reports doing well on CPAP, no issues or concerns. Pt reports having no HA since starting nortriptalyline. Pt reports sharp pn in her eyes last week, came and went.      HISTORY OF PRESENT ILLNESS:  03/21/21 ALL:  Stephanie Parks is a 76 y.o. female here today for follow up for OSA on CPAP.  Her compliance for the last 30 days demonstrates 100% compliance for days used and 100% compliance for >4 hours. Her AHI is 0.9 and she does have a leak of 59.6 L/min, however she doesn't have any concerns with it. She reports having issues with getting her mask detached from her headgear.   We started nortriptyline 50mg  at bedtime at last visit 10/2020. She is not taking the Emgality or [name] as it was not covered.  She reports that she has been doing much better with this and has not been having any headaches. She doesn't have any issues with missing medications or concerns for side effects. She does report that she has an episode of pain in her eyes last week, sudden sharp pain in one eye that instantly resolved, then it happened again two days later in the other eye. She visited her eye doctor and PCP since. No obvious triggers. No recurrence.      HISTORY: (copied from previous note)  11/15/2020 ALL: [name] returns for worsening headaches. We started her on Ajovy at last visit in 07/2020. She had first injection in the office. Insurance would not cover and she was switched to [employer] with first injection around 09/16/2020. She called 09/28/2020 reporting that Emgality was not helping and migraines were worsening. [name] was very expensive and not effective. Steroid taper was called in at that time. She called 9/26 reporting cost of Emgality and [name] was too expensive  and wished to schedule follow up for change in therapy. She feels that [place] were effective.    She was seen in the ER 11/07/2020 for dizziness, headache and hypertension. She had reported a sudden onset of intractable headache following a dilated eye exam that day. BP was > 536 systolic. She was treated with hydralazine. Follow up BP 159/82. Patient reportedly taking losartan 100mg  daily and metoprolol ER 25mg  (taking 1/2 tablet-12.5mg ). CT was negative. Blood work unremarkable. She was advised to keep BP log and follow up with PCP. She has a BP log with her today showing readings from 644'I-347'Q systolic. She has an appt with her PCP today to discuss.    I have reviewed notes from previous neurologist. She seemed to do the best on nortriptyline. She did try this medication for about 2-3 weeks earlier this year but did not feel it was effective. She admits that anxiety could be contributing to symptoms. She has been hesitant to try sumatriptan due to potential side effects.    Patient has tried and failed: Preventative: Ajovy (not covered by insurance), Emgality (ineffective and too expensive), Topiramate (palpitations), metoprolol (on now), gabapentin (caused headaches), nortriptyline (ineffective, history of constipation and dry mouth)   Abortive: [place], Tylenol   MM 05/19/19: Ms. Hoelzer is a 76 year old female with a history of obstructive sleep apnea on CPAP.  Her download indicates that she used her machine 25 out of 30  days for compliance of 83%.  She used her machine greater than 4 hours 24 days for compliance of 80%.  On average she uses her machine 6 hours and 34 minutes.  Residual AHI is 2.8 on 7 cm of water with EPR 3.  Leak in the 95th percentile is 26.1 L/min.She reports that she does see the benefit.  Reports that she is no longer having heart palpitations at night.  REVIEW OF SYSTEMS: Out of a complete 14 system review of symptoms, the patient complains only of the following  symptoms, eye pain and all other reviewed systems are negative.  ESS: 4  ALLERGIES: Allergies  Allergen Reactions   Shellfish Allergy Hives and Swelling   Ace Inhibitors Other (See Comments)    Pt cannot recall her reaction but tolerates arb    Gabapentin Other (See Comments)    headaches   Pitavastatin     Unknown reaction    Topiramate     Heart race,    Sulfa Antibiotics Other (See Comments) and Rash    Fine bumps    HOME MEDICATIONS: Outpatient Medications Prior to Visit  Medication Sig Dispense Refill   acetaminophen (TYLENOL) 325 MG tablet Take 650 mg by mouth. For headaches     alum & mag hydroxide-simeth (MAALOX MAX) 400-400-40 MG/5ML suspension Take 10 mLs by mouth every 6 (six) hours as needed for indigestion. 355 mL 0   Calcium Carb-Cholecalciferol (CALCIUM 600 + D PO) Take 1 tablet by mouth daily.     carvedilol (COREG) 6.25 MG tablet Take 1 tablet (6.25 mg total) by mouth 2 (two) times daily with a meal. 60 tablet 1   EPINEPHrine (EPIPEN 2-PAK) 0.3 mg/0.3 mL IJ SOAJ injection Inject 0.3 mLs (0.3 mg total) into the muscle as needed for anaphylaxis. Per allergen immunotherapy protocol 1 each 2   ethacrynic acid (EDECRIN) 25 MG tablet Take 2 tablets (50 mg total) by mouth daily. 60 tablet 1   fluticasone (FLONASE) 50 MCG/ACT nasal spray USE 2 SPRAYS IN NOSTRIL(S) DAILY AS NEEDED.  In the right nostril point the applicator out toward your right ear.  In the left nostril point the applicator out toward her left ear (Patient taking differently: Place 2 sprays into both nostrils daily as needed for allergies.) 16 g 9   losartan (COZAAR) 100 MG tablet TAKE 1 TABLET BY MOUTH EVERY DAY 90 tablet 3   METAMUCIL FIBER PO Take by mouth.     Multiple Vitamins-Minerals (MULTIVITAL) tablet Take 1 tablet by mouth daily.      omeprazole (PRILOSEC) 40 MG capsule Take 40 mg by mouth daily.     potassium chloride SA (KLOR-CON M) 20 MEQ tablet TAKE 1 TABLET BY MOUTH EVERY DAY 180 tablet 1    rosuvastatin (CRESTOR) 10 MG tablet TAKE 1 TABLET BY MOUTH EVERY DAY 90 tablet 3   traMADol (ULTRAM) 50 MG tablet Take 1-2 tablets (50-100 mg total) by mouth every 6 (six) hours as needed. 30 tablet 0   nortriptyline (PAMELOR) 25 MG capsule Take 2 capsules (50 mg total) by mouth at bedtime. 180 capsule 3   No facility-administered medications prior to visit.    PAST MEDICAL HISTORY: Past Medical History:  Diagnosis Date   3-vessel CAD 03/01/2015   Achalasia 09/28/2012   Allergic rhinitis 03/01/2015   Anemia    Aneurysm ([place]) 03/01/2015   BP (high blood pressure) 03/01/2015   Bronchitis 04/23/2018   Cephalalgia 08/24/2012   Overview:  ICD-10 cut over  Chest pain 04/04/2011   CN (constipation) 09/28/2012   Coughing 04/24/2017   Decreased potassium in the blood 03/01/2015   Diverticulosis    Dizziness 03/01/2015   Erythropoietin deficiency anemia 03/13/2021   GERD (gastroesophageal reflux disease)    Hiatal hernia    Hypercholesterolemia 03/01/2015   Hyperlipidemia    Hypertension    Ingrown toenail 08/10/2017   Inguinal hernia    Iron deficiency anemia 03/13/2021   Migraine headache    Onychomycosis 12/08/2017   Paresthesia of arm 03/01/2015   Schatzki's ring    Tendonitis, Achilles, right 12/08/2017   Unilateral primary osteoarthritis, left knee 10/07/2017    PAST SURGICAL HISTORY: Past Surgical History:  Procedure Laterality Date   ABDOMINAL HYSTERECTOMY     BACK SURGERY     x 3   CARPAL TUNNEL RELEASE     left wrist   CERVICAL SPINE SURGERY     HEMORRHOID SURGERY      FAMILY HISTORY: Family History  Problem Relation Age of Onset   Allergic rhinitis Sister    Diabetes Sister    Hypertension Sister    Heart attack Father 59   Heart disease Father    Stomach cancer Maternal Grandmother    Heart disease Mother    Colon cancer Neg Hx    Angioedema Neg Hx    Asthma Neg Hx    Eczema Neg Hx    Urticaria Neg Hx    Immunodeficiency Neg Hx    Breast cancer Neg Hx     Migraines Neg Hx    Headache Neg Hx     SOCIAL HISTORY: Social History   Socioeconomic History   Marital status: Divorced    Spouse name: Not on file   Number of children: 2   Years of education: Not on file   Highest education level: Some college, no degree  Occupational History   Occupation: Surveyor, quantity: [employer]  Tobacco Use   Smoking status: Former    Packs/day: 0.50    Years: 20.00    Pack years: 10.00    Types: Cigarettes    Quit date: 02/19/1984    Years since quitting: 37.1   Smokeless tobacco: Never  Vaping Use   Vaping Use: Never used  Substance and Sexual Activity   Alcohol use: No   Drug use: No   Sexual activity: Yes    Partners: Male  Other Topics Concern   Not on file  Social History Narrative   Lives alone   Caffeine use: Coffee one cup daily   Right handed    Son is her next of kin   Working at [employer]   Financial Resource Strain: Medium Risk   Difficulty of Paying Living Expenses: Somewhat hard  Food Insecurity: No Food Insecurity   Worried About Charity fundraiser in the Last Year: Never true   Arboriculturist in the Last Year: Never true  Transportation Needs: No Transportation Needs   Lack of Transportation (Medical): No   Lack of Transportation (Non-Medical): No  Physical Activity: Inactive   Days of Exercise per Week: 0 days   Minutes of Exercise per Session: 0 min  Stress: Not on file  Social Connections: Not on file  Intimate Partner Violence: Not on file     PHYSICAL EXAM  Vitals:   03/21/21 0828  BP: (!) 169/84  Pulse: 93  Weight: 212 lb (96.2 kg)  Height: 5\' 5"  (1.651 m)  Body mass index is 35.28 kg/m.  Generalized: Well developed, in no acute distress  Cardiology: normal rate and rhythm, no murmur noted Respiratory: clear to auscultation bilaterally  Neurological examination  Mentation: Alert oriented to time, place, history taking. Follows all commands speech and language  fluent Cranial nerve II-XII: Pupils were equal round reactive to light. Extraocular movements were full, visual field were full  Motor: The motor testing reveals 5 over 5 strength of all 4 extremities. Good symmetric motor tone is noted throughout.  Gait and station: Gait is normal.    DIAGNOSTIC DATA (LABS, IMAGING, TESTING) - I reviewed patient records, labs, notes, testing and imaging myself where available.  No flowsheet data found.   Lab Results  Component Value Date   WBC 7.2 03/12/2021   HGB 11.0 (L) 03/12/2021   HCT 34.2 (L) 03/12/2021   MCV 88.4 03/12/2021   PLT 293 03/12/2021      Component Value Date/Time   NA 141 03/12/2021 1332   NA 141 02/01/2021 1505   K 3.8 03/12/2021 1332   CL 102 03/12/2021 1332   CO2 34 (H) 03/12/2021 1332   GLUCOSE 89 03/12/2021 1332   BUN 15 03/12/2021 1332   BUN 16 02/01/2021 1505   CREATININE 0.92 03/12/2021 1332   CREATININE 0.85 02/23/2021 1434   CALCIUM 9.7 03/12/2021 1332   PROT 6.8 03/12/2021 1332   PROT 6.2 07/28/2018 0756   ALBUMIN 3.9 03/12/2021 1332   ALBUMIN 4.0 07/28/2018 0756   AST 20 03/12/2021 1332   ALT 13 03/12/2021 1332   ALKPHOS 79 03/12/2021 1332   BILITOT 0.3 03/12/2021 1332   GFRNONAA >60 03/12/2021 1332   GFRAA 82 05/18/2020 0000   Lab Results  Component Value Date   CHOL 161 06/02/2019   HDL 56.00 06/02/2019   LDLCALC 87 06/02/2019   TRIG 98 03/12/2020   CHOLHDL 3 06/02/2019   Lab Results  Component Value Date   HGBA1C 5.9 (H) 02/23/2021   Lab Results  Component Value Date   VITAMINB12 1,263 (H) 03/17/2020   Lab Results  Component Value Date   TSH 2.93 03/07/2021     ASSESSMENT AND PLAN 76 y.o. year old female  has a past medical history of 3-vessel CAD (03/01/2015), Achalasia (09/28/2012), Allergic rhinitis (03/01/2015), Anemia, Aneurysm ([place]) (03/01/2015), BP (high blood pressure) (03/01/2015), Bronchitis (04/23/2018), Cephalalgia (08/24/2012), Chest pain (04/04/2011), CN (constipation)  (09/28/2012), Coughing (04/24/2017), Decreased potassium in the blood (03/01/2015), Diverticulosis, Dizziness (03/01/2015), Erythropoietin deficiency anemia (03/13/2021), GERD (gastroesophageal reflux disease), Hiatal hernia, Hypercholesterolemia (03/01/2015), Hyperlipidemia, Hypertension, Ingrown toenail (08/10/2017), Inguinal hernia, Iron deficiency anemia (03/13/2021), Migraine headache, Onychomycosis (12/08/2017), Paresthesia of arm (03/01/2015), Schatzki's ring, Tendonitis, Achilles, right (12/08/2017), and Unilateral primary osteoarthritis, left knee (10/07/2017). here with     ICD-10-CM   1. Chronic migraine without aura without status migrainosus, not intractable  G43.709     2. OSA on CPAP  G47.33 For home use only DME continuous positive airway pressure (CPAP)   Z99.89         Stephanie Parks is doing well on CPAP therapy. Compliance report reveals 100% compliance. She was encouraged to continue using CPAP nightly and for greater than 4 hours each night. Patient will watch her leak at home and if she begins to have any concerns with it she will reach out to [place]. We encouraged the patient to reach out to her DME company about her issues with the headgear. We will update supply orders as indicated. Risks of untreated sleep apnea  review and education materials provided. Migraines are doing much better with the Nortriptyline and we will continue this for her migraines. She was encouraged to monitor BP at home. Contact PCP if readings remain elevated. Healthy lifestyle habits encouraged. She will follow up in 1 year, sooner if needed. She verbalizes understanding and agreement with this plan.    Orders Placed This Encounter  Procedures   For home use only DME continuous positive airway pressure (CPAP)    Supplies    Order Specific Question:   Length of Need    Answer:   Lifetime    Order Specific Question:   Patient has OSA or probable OSA    Answer:   Yes    Order Specific Question:   Is the patient  currently using CPAP in the home    Answer:   Yes    Order Specific Question:   Settings    Answer:   Other see comments    Order Specific Question:   CPAP supplies needed    Answer:   Mask, headgear, cushions, filters, heated tubing and water chamber     Meds ordered this encounter  Medications   nortriptyline (PAMELOR) 25 MG capsule    Sig: Take 2 capsules (50 mg total) by mouth at bedtime.    Dispense:  180 capsule    Refill:  3    Order Specific Question:   Supervising Provider    Answer:   Melvenia Beam [[id number]]      PPG FQMKJ, FNP-C 03/21/2021, 9:24 AM [facility] [address] [place], [place] [address] [phone]

## 2021-03-21 ENCOUNTER — Ambulatory Visit (INDEPENDENT_AMBULATORY_CARE_PROVIDER_SITE_OTHER): Payer: Medicare Other

## 2021-03-21 ENCOUNTER — Ambulatory Visit: Payer: Medicare Other | Admitting: Family Medicine

## 2021-03-21 ENCOUNTER — Encounter: Payer: Self-pay | Admitting: Family Medicine

## 2021-03-21 ENCOUNTER — Telehealth: Payer: Self-pay | Admitting: Family Medicine

## 2021-03-21 VITALS — BP 169/84 | HR 93 | Ht 65.0 in | Wt 212.0 lb

## 2021-03-21 DIAGNOSIS — G43709 Chronic migraine without aura, not intractable, without status migrainosus: Secondary | ICD-10-CM | POA: Diagnosis not present

## 2021-03-21 DIAGNOSIS — Z9989 Dependence on other enabling machines and devices: Secondary | ICD-10-CM

## 2021-03-21 DIAGNOSIS — J309 Allergic rhinitis, unspecified: Secondary | ICD-10-CM | POA: Diagnosis not present

## 2021-03-21 DIAGNOSIS — G4733 Obstructive sleep apnea (adult) (pediatric): Secondary | ICD-10-CM | POA: Diagnosis not present

## 2021-03-21 MED ORDER — NORTRIPTYLINE HCL 25 MG PO CAPS: 50.0000 mg | ORAL_CAPSULE | Freq: Every day | ORAL | 3 refills | Status: DC

## 2021-03-21 NOTE — Patient Instructions (Addendum)
Below is our plan:  We will continue with the nortriptyline 50 mg at bedtime.   Please keep an eye on your blood pressure at home. Today's reading was elevated, however, previous readings were not as bad. Please call PCP if BP readings remain elevated at home.   Please make sure you are staying well hydrated. I recommend 50-60 ounces daily. Well balanced diet and regular exercise encouraged. Consistent sleep schedule with 6-8 hours recommended.   Please continue using your CPAP regularly. While your insurance requires that you use CPAP at least 4 hours each night on 70% of the nights, I recommend, that you not skip any nights and use it throughout the night if you can. Getting used to CPAP and staying with the treatment long term does take time and patience and discipline. Untreated obstructive sleep apnea when it is moderate to severe can have an adverse impact on cardiovascular health and raise her risk for heart disease, arrhythmias, hypertension, congestive heart failure, stroke and diabetes. Untreated obstructive sleep apnea causes sleep disruption, nonrestorative sleep, and sleep deprivation. This can have an impact on your day to day functioning and cause daytime sleepiness and impairment of cognitive function, memory loss, mood disturbance, and problems focussing. Using CPAP regularly can improve these symptoms.  Please continue follow up with care team as directed.   Follow up with me in 1 year  You may receive a survey regarding today's visit. I encourage you to leave honest feed back as I do use this information to improve patient care. Thank you for seeing me today!

## 2021-03-21 NOTE — Telephone Encounter (Signed)
Pt states she is having a problem with insurance and pharmacy has been unable to refill coreg. She is completely out and her bp has been going up again. Last time she took it, it was 169/84. Did ask pt if she wanted to speak with a nurse since bp was high, she declined and said she would take it again. Please advise.   Medication: carvedilol (COREG) 6.25 MG tablet   Has the patient contacted their pharmacy? Yes.     Preferred Pharmacy: [id number] - [place], [place] [address], [place] [address]  Phone:  [phone]  Fax:  [phone]

## 2021-03-21 NOTE — Telephone Encounter (Signed)
Called the pharmacy, and the rep said that she had refills on file and he saw nothing wrong with her insurance on his end- he started processing her refill while on the phone.   Called the pt back to let her know- Pt aware and voices understanding. And also let her know to call and make sure her insurance covered the Rx before going to the store. She mentioned a 13lb weight gain in the  last 2 weeks that she wanted your advise on as well, has not rpt her BP since prior reading.  Please advise.

## 2021-03-21 NOTE — Progress Notes (Signed)
CM sent to [facility] for new orders.

## 2021-03-22 ENCOUNTER — Telehealth: Payer: Self-pay | Admitting: Interventional Cardiology

## 2021-03-22 DIAGNOSIS — R0600 Dyspnea, unspecified: Secondary | ICD-10-CM

## 2021-03-22 NOTE — Telephone Encounter (Signed)
New Message:   Patient said her primary doctor wants her to see Dr Irish Lack for shortness of breath and discomfort on leftt side of her chest under her breast.  Pt c/o Shortness Of Breath: STAT if SOB developed within the last 24 hours or pt is noticeably SOB on the phone  1. Are you currently SOB (can you hear that pt is SOB on the phone)? Not at this exact time  2. How long have you been experiencing SOB? about a month  3. Are you SOB when sitting or when up moving around? Moving around  4.  Are you currently experiencing any other symptoms? Hurting under breast on left side of chest- patient wants to be seen

## 2021-03-22 NOTE — Telephone Encounter (Signed)
Called her back-  She got weighed at her neurologist yesterday am and noted that her weight had gone up to 212lbs However she just weighed herself at home during our conversation and her weight is 209 which is normal for her  This is reassuring, she will let me know if anything else is needed   Wt Readings from Last 3 Encounters:  03/21/21 212 lb (96.2 kg)  03/12/21 207 lb (93.9 kg)  03/07/21 208 lb 9.6 oz (94.6 kg)

## 2021-03-22 NOTE — Telephone Encounter (Signed)
The patient is complaining of SOB and pain under the left side of her breast that comes and goes for the last month, lasting only for a couple of seconds, sharp in nature. The dyspnea occurs when she is up and moving around. The pain occurs randomly at rest or activity.  The patient has not missed any doses of medication.  VS: 02/19/21 134/58 80 02/20/21 128/54 84  02/21/21 138/68 77 02/23/20 137/63 99 02/24/20 160/81 88 03/04/20 127/62  73 03/21/21 169/64 148/75 82  Swelling in right and left feet.  Weights: 02/07/21 202 lbs 02/13/21 205 lbs 02/18/21 206 lbs 03/21/21 212 lbs 03/22/21 209 lbs  No lifestyle changes.  ED precautions given. Verbalized understanding and agreement.  Will route to MD for advisement.

## 2021-03-25 ENCOUNTER — Encounter (HOSPITAL_BASED_OUTPATIENT_CLINIC_OR_DEPARTMENT_OTHER): Payer: Self-pay

## 2021-03-25 ENCOUNTER — Emergency Department (HOSPITAL_BASED_OUTPATIENT_CLINIC_OR_DEPARTMENT_OTHER): Payer: Medicare Other

## 2021-03-25 ENCOUNTER — Other Ambulatory Visit: Payer: Self-pay

## 2021-03-25 ENCOUNTER — Emergency Department (HOSPITAL_BASED_OUTPATIENT_CLINIC_OR_DEPARTMENT_OTHER)
Admission: EM | Admit: 2021-03-25 | Discharge: 2021-03-25 | Disposition: A | Discharge status: Home / Self Care | Payer: Medicare Other | Attending: Emergency Medicine | Admitting: Emergency Medicine

## 2021-03-25 DIAGNOSIS — G43809 Other migraine, not intractable, without status migrainosus: Secondary | ICD-10-CM

## 2021-03-25 DIAGNOSIS — I672 Cerebral atherosclerosis: Secondary | ICD-10-CM | POA: Diagnosis not present

## 2021-03-25 DIAGNOSIS — R519 Headache, unspecified: Secondary | ICD-10-CM | POA: Diagnosis present

## 2021-03-25 DIAGNOSIS — I6503 Occlusion and stenosis of bilateral vertebral arteries: Secondary | ICD-10-CM | POA: Diagnosis not present

## 2021-03-25 DIAGNOSIS — M542 Cervicalgia: Secondary | ICD-10-CM | POA: Diagnosis not present

## 2021-03-25 LAB — CBC WITH DIFFERENTIAL/PLATELET
Abs Immature Granulocytes: 0.01 K/uL (ref 0.00–0.07)
Basophils Absolute: 0 K/uL (ref 0.0–0.1)
Basophils Relative: 1 %
Eosinophils Absolute: 0.2 K/uL (ref 0.0–0.5)
Eosinophils Relative: 3 %
HCT: 32.7 % — ABNORMAL LOW (ref 36.0–46.0)
Hemoglobin: 10.7 g/dL — ABNORMAL LOW (ref 12.0–15.0)
Immature Granulocytes: 0 %
Lymphocytes Relative: 36 %
Lymphs Abs: 2 K/uL (ref 0.7–4.0)
MCH: 28.8 pg (ref 26.0–34.0)
MCHC: 32.7 g/dL (ref 30.0–36.0)
MCV: 87.9 fL (ref 80.0–100.0)
Monocytes Absolute: 0.4 K/uL (ref 0.1–1.0)
Monocytes Relative: 7 %
Neutro Abs: 3 K/uL (ref 1.7–7.7)
Neutrophils Relative %: 53 %
Platelets: 237 K/uL (ref 150–400)
RBC: 3.72 MIL/uL — ABNORMAL LOW (ref 3.87–5.11)
RDW: 15.2 % (ref 11.5–15.5)
WBC: 5.7 K/uL (ref 4.0–10.5)
nRBC: 0 % (ref 0.0–0.2)

## 2021-03-25 LAB — BASIC METABOLIC PANEL
Anion gap: 6 (ref 5–15)
BUN: 12 mg/dL (ref 8–23)
CO2: 29 mmol/L (ref 22–32)
Calcium: 8.8 mg/dL — ABNORMAL LOW (ref 8.9–10.3)
Chloride: 103 mmol/L (ref 98–111)
Creatinine, Ser: 0.83 mg/dL (ref 0.44–1.00)
GFR, Estimated: >60 mL/min (ref >60)
Glucose, Bld: 83 mg/dL (ref 70–99)
Potassium: 2.9 mmol/L — ABNORMAL LOW (ref 3.5–5.1)
Sodium: 138 mmol/L (ref 135–145)

## 2021-03-25 MED ORDER — IOHEXOL 350 MG/ML SOLN
80.0000 mL | Freq: Once | INTRAVENOUS | Status: AC | PRN
  Administered 2021-03-25: 80 mL via INTRAVENOUS

## 2021-03-25 MED ORDER — PROCHLORPERAZINE EDISYLATE 10 MG/2ML IJ SOLN
10.0000 mg | Freq: Once | INTRAMUSCULAR | Status: AC
  Administered 2021-03-25: 10 mg via INTRAVENOUS
  Filled 2021-03-25: qty 2

## 2021-03-25 MED ORDER — POTASSIUM CHLORIDE CRYS ER 20 MEQ PO TBCR
40.0000 meq | EXTENDED_RELEASE_TABLET | Freq: Once | ORAL | Status: AC
  Administered 2021-03-25: 40 meq via ORAL
  Filled 2021-03-25: qty 2

## 2021-03-25 MED ORDER — DIPHENHYDRAMINE HCL 50 MG/ML IJ SOLN
25.0000 mg | Freq: Once | INTRAMUSCULAR | Status: AC
  Administered 2021-03-25: 25 mg via INTRAVENOUS
  Filled 2021-03-25: qty 1

## 2021-03-25 NOTE — ED Notes (Signed)
BEFAST NEGATIVE

## 2021-03-25 NOTE — ED Provider Notes (Signed)
[facility]  Provider Note  CSN: [id number] Arrival date & time: 03/25/21 1446  History Chief Complaint  Patient presents with   Headache    Stephanie Parks is a 76 y.o. female with known history of cerebral aneurysm which has been monitored and has not required intervention also has history of headaches, followed by Neurology and has had good control recently on amitriptyline. She reports around 1230hrs today she had sudden onset of diffuse throbbing headache which has since localized to left head. Some neck pain. No blurry vision but she has had some nausea. This headache has some features similar to and some different from her prior headaches. She took one [place] that she had been previously given by her Neurologist with moderate improvement.    Home Medications Prior to Admission medications   Medication Sig Start Date End Date Taking? Authorizing Provider  acetaminophen (TYLENOL) 325 MG tablet Take 650 mg by mouth. For headaches    [provider]  alum & mag hydroxide-simeth (MAALOX MAX) 400-400-40 MG/5ML suspension Take 10 mLs by mouth every 6 (six) hours as needed for indigestion. 01/03/21   Long, Wonda Olds, MD  Calcium Carb-Cholecalciferol (CALCIUM 600 + D PO) Take 1 tablet by mouth daily.    [provider]  carvedilol (COREG) 6.25 MG tablet Take 1 tablet (6.25 mg total) by mouth 2 (two) times daily with a meal. 02/06/21   Colon Branch, MD  EPINEPHrine (EPIPEN 2-PAK) 0.3 mg/0.3 mL IJ SOAJ injection Inject 0.3 mLs (0.3 mg total) into the muscle as needed for anaphylaxis. Per allergen immunotherapy protocol 10/04/19   Ambs, Kathrine Cords, FNP  ethacrynic acid (EDECRIN) 25 MG tablet Take 2 tablets (50 mg total) by mouth daily. 02/23/21   Colon Branch, MD  fluticasone (FLONASE) 50 MCG/ACT nasal spray USE 2 SPRAYS IN NOSTRIL(S) DAILY AS NEEDED.  In the right nostril point the applicator out toward your right ear.  In the left nostril point the  applicator out toward her left ear Patient taking differently: Place 2 sprays into both nostrils daily as needed for allergies. 10/04/19   Dara Hoyer, FNP  losartan (COZAAR) 100 MG tablet TAKE 1 TABLET BY MOUTH EVERY DAY 03/13/21   Jettie Booze, MD  METAMUCIL FIBER PO Take by mouth.    [provider]  Multiple Vitamins-Minerals (MULTIVITAL) tablet Take 1 tablet by mouth daily.     [provider]  nortriptyline (PAMELOR) 25 MG capsule Take 2 capsules (50 mg total) by mouth at bedtime. 03/21/21   Lomax, Amy, NP  omeprazole (PRILOSEC) 40 MG capsule Take 40 mg by mouth daily. 10/05/20   [provider]  potassium chloride SA (KLOR-CON M) 20 MEQ tablet TAKE 1 TABLET BY MOUTH EVERY DAY 01/24/21   Jettie Booze, MD  rosuvastatin (CRESTOR) 10 MG tablet TAKE 1 TABLET BY MOUTH EVERY DAY 01/17/21   Copland, Gay Filler, MD  traMADol (ULTRAM) 50 MG tablet Take 1-2 tablets (50-100 mg total) by mouth every 6 (six) hours as needed. 01/30/21   Mcarthur Rossetti, MD     Allergies    Shellfish allergy, Ace inhibitors, Gabapentin, Pitavastatin, Topiramate, and Sulfa antibiotics   Review of Systems   Review of Systems Please see HPI for pertinent positives and negatives  Physical Exam BP (!) 160/75 (BP Location: Right Arm)    Pulse 81    Temp 98.1 F (36.7 C) (Oral)    Resp 18    SpO2  99%   Physical Exam Vitals and nursing note reviewed.  Constitutional:      Appearance: Normal appearance.  HENT:     Head: Normocephalic and atraumatic.     Nose: Nose normal.     Mouth/Throat:     Mouth: Mucous membranes are moist.  Eyes:     Extraocular Movements: Extraocular movements intact.     Conjunctiva/sclera: Conjunctivae normal.  Cardiovascular:     Rate and Rhythm: Normal rate.  Pulmonary:     Effort: Pulmonary effort is normal.     Breath sounds: Normal breath sounds.  Abdominal:     General: Abdomen is flat.     Palpations: Abdomen is soft.      Tenderness: There is no abdominal tenderness.  Musculoskeletal:        General: No swelling. Normal range of motion.     Cervical back: Neck supple.  Skin:    General: Skin is warm and dry.  Neurological:     General: No focal deficit present.     Mental Status: She is alert.  Psychiatric:        Mood and Affect: Mood normal.    ED Results / Procedures / Treatments   EKG None  Procedures Procedures  Medications Ordered in the ED Medications  prochlorperazine (COMPAZINE) injection 10 mg (has no administration in time range)  diphenhydrAMINE (BENADRYL) injection 25 mg (has no administration in time range)    Initial Impression and Plan  Patient with known aneurysm here with sudden onset headache, different from her typical. She has not had her aneurysm imaged in about 2 years. Will check CTA head and basic labs. Compazine/benadryl for headache.  ED Course   Clinical Course as of 03/25/21 1839  Nancy Fetter Mar 25, 2021  1705 Initial blood work was lost in the process and had to be redrawn. CBC is unremarkable.  [CS]  0737 BMP is unremarkable aside from mild hypokalemia. Will give an oral dose to replete.  [CS]  1062 CTA images and results reviewed, no change in size of aneurysm. Patient's headache has resolved and she wants to go home. Advised to continue her home meds, follow up with Neurology if headache persists. RTED for any other concerns.  [CS]    Clinical Course User Index [CS] Truddie Hidden, MD     MDM Rules/Calculators/A&P Medical Decision Making Problems Addressed: Other migraine without status migrainosus, not intractable: chronic illness or injury with exacerbation, progression, or side effects of treatment  Amount and/or Complexity of Data Reviewed Labs: ordered. Decision-making details documented in ED Course. Radiology: ordered and independent interpretation performed. Decision-making details documented in ED Course.  Risk Prescription drug  management. Decision regarding hospitalization.    Final Clinical Impression(s) / ED Diagnoses Final diagnoses:  Other migraine without status migrainosus, not intractable    Rx / DC Orders ED Discharge Orders     None        Truddie Hidden, MD 03/25/21 1840

## 2021-03-25 NOTE — ED Triage Notes (Signed)
Pt reports left side headache since 1230. States headache started while she was cleaning "came on real fast". Reports hx of migraine in the past and "small brain aneurysm"

## 2021-03-25 NOTE — ED Notes (Signed)
Pt labs redrawn, lab unable to process original collection

## 2021-03-25 NOTE — ED Notes (Signed)
ED Provider at bedside. 

## 2021-03-26 NOTE — Telephone Encounter (Signed)
She has had some of these sx chronically when looking back at my note.  WOuld check echo to look for any structural heart disease. F/u based on the echo.

## 2021-03-26 NOTE — Telephone Encounter (Signed)
Spoke with the patient and advised her that Dr. Irish Lack has ordered an echocardiogram. Patient verbalized understanding.

## 2021-03-29 ENCOUNTER — Encounter: Payer: Self-pay | Admitting: Family Medicine

## 2021-03-29 NOTE — Telephone Encounter (Signed)
Appointment note from cancelled GI appointment says:  "**Pt is a pt of [facility]-referred back to them unless she wishes to pursue a transfer of care with Dr. Hilarie Fredrickson.  Appt cancelled.  LVM for pt to pursue care with [facility]**  bloating, gassy, feels like food sits in upper stomach, uncontrolled BM's/scheduled with patient/Medicaid/lsw"

## 2021-03-30 ENCOUNTER — Telehealth: Payer: Self-pay | Admitting: Family Medicine

## 2021-03-30 NOTE — Telephone Encounter (Signed)
Copied from [place] [phone]. Topic: Medicare AWV >> Mar 30, 2021  9:12 AM Cher Nakai R wrote: Reason for CRM:  Left message for patient to call back and schedule Medicare Annual Wellness Visit (AWV) in office.   If not able to come in office, please offer to do virtually or by telephone.   Last AWV:  Please schedule at anytime with [place].

## 2021-04-03 ENCOUNTER — Ambulatory Visit (HOSPITAL_COMMUNITY): Payer: Medicare Other | Attending: Cardiology

## 2021-04-03 ENCOUNTER — Other Ambulatory Visit: Payer: Self-pay

## 2021-04-03 ENCOUNTER — Ambulatory Visit: Payer: Medicare Other | Admitting: Pharmacist

## 2021-04-03 VITALS — BP 142/68 | HR 91

## 2021-04-03 DIAGNOSIS — I10 Essential (primary) hypertension: Secondary | ICD-10-CM | POA: Diagnosis not present

## 2021-04-03 DIAGNOSIS — R0609 Other forms of dyspnea: Secondary | ICD-10-CM

## 2021-04-03 DIAGNOSIS — R0600 Dyspnea, unspecified: Secondary | ICD-10-CM | POA: Insufficient documentation

## 2021-04-03 LAB — ECHOCARDIOGRAM COMPLETE
Area-P 1/2: 3.95 cm2
S' Lateral: 2.7 cm

## 2021-04-03 MED ORDER — CARVEDILOL 6.25 MG PO TABS: 6.2500 mg | ORAL_TABLET | Freq: Two times a day (BID) | ORAL | 3 refills | Status: DC

## 2021-04-03 NOTE — Patient Instructions (Addendum)
Please start taking carvedilol 6.25mg  twice a day at 4:30 AM and 4:30 PM Continue losartan 100mg  daily  Please call me in 2 weeks for blood pressure follow up

## 2021-04-03 NOTE — Progress Notes (Signed)
Patient ID: Stephanie Parks                 DOB: [birth year]                      MRN: [id number]     HPI: Stephanie Parks is a 76 y.o. female referred by Dr. Irish Lack to HTN clinic. PMH is significant for HTN, HLD, brain aneurysm, palpitations, frequent trips to urgent care ER for chest pain, cath with no PCI needed and knee arthritis needing knee replacement. Patient saw Dr. Irish Lack on 11/2. Her metoprolol was stopped and she was started on amlodipine. Per Dr. Irish Lack note, patient has LLE at baseline. Patient was seen in the ED on 11/13 for chest pain, complained of swelling of her legs and her amlodipine was stopped. She was started on carvedilol. She was seen again in the ED on 11/16 for chest pain/burning. Thought to be from GERD. Advised to use Mallox. Patient seen initially in Nov 2022 by pharmD. Had swelling in her foot that she thought was from carvedilol. BP was well controlled and she decided to stay on same therapy. She called office in Dec stating she wanted to get off of carvedilol. She was switched to spironolactone 12.5mg  daily.  She saw Dr. Larose Kells on 12/20 for swelling/HTN. Spironolactone was stopped and carvedilol was resumed. Edecrin was started due to sulfa allergy. Still with SOB. PE ruled out, found to have very mild iron deficiency anemia which Dr. Marin Olp did not feel was the cause of her symptoms. Echo scheduled for today.  Patient presents today to clinic. She is no longer taking ethacryinc acid. It cost $100 at the pharmacy. She states her hip doesn't hurt her as much and she has been doing so leg raises. She is only taking carvedilol once a day as taking it twice a day caused her to feel lightheaded. However, she was taking the two doses only 5 hours apart when she was feeling dizzy. Has not taken carvedilol yet today.  Current HTN meds: carvedilol 6.25mg  twice a day, losartan 100mg  daily,  Previously tried: spironolactone, irbesartan, amlodipine BP goal: <130/80  Family  History: The patient's family history includes Allergic rhinitis in her sister; Diabetes in her sister; Heart attack (age of onset: 17) in her father; Heart disease in her father and mother; Hypertension in her sister; Stomach cancer in her maternal grandmother.   Social History: The patient  reports that she quit smoking about 36 years ago. Her smoking use included cigarettes. She has a 10.00 pack-year smoking history. She has never used smokeless tobacco. She reports that she does not drink alcohol and does not use drugs.   Diet: coffee once or twice a week, tea very seldom, gingerale, chocolate  Exercise: doing left lifts, walking doesn't bother her hip as much  Home BP readings: 150's  Wt Readings from Last 3 Encounters:  03/21/21 212 lb (96.2 kg)  03/12/21 207 lb (93.9 kg)  03/07/21 208 lb 9.6 oz (94.6 kg)   BP Readings from Last 3 Encounters:  03/25/21 (!) 154/76  03/21/21 (!) 169/84  03/15/21 (!) 142/64   Pulse Readings from Last 3 Encounters:  03/25/21 70  03/21/21 93  03/15/21 73    Renal function: Estimated Creatinine Clearance: 67.2 mL/min (by C-G formula based on SCr of 0.83 mg/dL).  Past Medical History:  Diagnosis Date   3-vessel CAD 03/01/2015   Achalasia 09/28/2012   Allergic rhinitis 03/01/2015   Anemia  Aneurysm ([place]) 03/01/2015   BP (high blood pressure) 03/01/2015   Bronchitis 04/23/2018   Cephalalgia 08/24/2012   Overview:  ICD-10 cut over     Chest pain 04/04/2011   CN (constipation) 09/28/2012   Coughing 04/24/2017   Decreased potassium in the blood 03/01/2015   Diverticulosis    Dizziness 03/01/2015   Erythropoietin deficiency anemia 03/13/2021   GERD (gastroesophageal reflux disease)    Hiatal hernia    Hypercholesterolemia 03/01/2015   Hyperlipidemia    Hypertension    Ingrown toenail 08/10/2017   Inguinal hernia    Iron deficiency anemia 03/13/2021   Migraine headache    Onychomycosis 12/08/2017   Paresthesia of arm 03/01/2015   Schatzki's ring     Tendonitis, Achilles, right 12/08/2017   Unilateral primary osteoarthritis, left knee 10/07/2017    Current Outpatient Medications on File Prior to Visit  Medication Sig Dispense Refill   acetaminophen (TYLENOL) 325 MG tablet Take 650 mg by mouth. For headaches     alum & mag hydroxide-simeth (MAALOX MAX) 400-400-40 MG/5ML suspension Take 10 mLs by mouth every 6 (six) hours as needed for indigestion. 355 mL 0   Calcium Carb-Cholecalciferol (CALCIUM 600 + D PO) Take 1 tablet by mouth daily.     carvedilol (COREG) 6.25 MG tablet Take 1 tablet (6.25 mg total) by mouth 2 (two) times daily with a meal. 60 tablet 1   EPINEPHrine (EPIPEN 2-PAK) 0.3 mg/0.3 mL IJ SOAJ injection Inject 0.3 mLs (0.3 mg total) into the muscle as needed for anaphylaxis. Per allergen immunotherapy protocol 1 each 2   ethacrynic acid (EDECRIN) 25 MG tablet Take 2 tablets (50 mg total) by mouth daily. 60 tablet 1   fluticasone (FLONASE) 50 MCG/ACT nasal spray USE 2 SPRAYS IN NOSTRIL(S) DAILY AS NEEDED.  In the right nostril point the applicator out toward your right ear.  In the left nostril point the applicator out toward her left ear (Patient taking differently: Place 2 sprays into both nostrils daily as needed for allergies.) 16 g 9   losartan (COZAAR) 100 MG tablet TAKE 1 TABLET BY MOUTH EVERY DAY 90 tablet 3   METAMUCIL FIBER PO Take by mouth.     Multiple Vitamins-Minerals (MULTIVITAL) tablet Take 1 tablet by mouth daily.      nortriptyline (PAMELOR) 25 MG capsule Take 2 capsules (50 mg total) by mouth at bedtime. 180 capsule 3   omeprazole (PRILOSEC) 40 MG capsule Take 40 mg by mouth daily.     potassium chloride SA (KLOR-CON M) 20 MEQ tablet TAKE 1 TABLET BY MOUTH EVERY DAY 180 tablet 1   rosuvastatin (CRESTOR) 10 MG tablet TAKE 1 TABLET BY MOUTH EVERY DAY 90 tablet 3   traMADol (ULTRAM) 50 MG tablet Take 1-2 tablets (50-100 mg total) by mouth every 6 (six) hours as needed. 30 tablet 0   No current facility-administered  medications on file prior to visit.    Allergies  Allergen Reactions   Shellfish Allergy Hives and Swelling   Ace Inhibitors Other (See Comments)    Pt cannot recall her reaction but tolerates arb    Gabapentin Other (See Comments)    headaches   Pitavastatin     Unknown reaction    Topiramate     Heart race,    Sulfa Antibiotics Other (See Comments) and Rash    Fine bumps    There were no vitals taken for this visit.   Assessment/Plan:  1. Hypertension - BP is above goal of <130/80  today in clinic. I have asked the patient to start taking carvedilol twice a day, but to take it 12 hours apart. She should take the first dose with her losartan at 4:30AM and then her second dose at 4:30PM. Check BP at home occasionally since it causes patient anxiety. She declined in person follow up with me. She will call me in 2 weeks with some of her blood pressure readings.   Thank you  Ramond Dial, Pharm.D, BCPS, CPP [place]  [address], [place], [place] [address]  Phone: [phone]; Fax: [phone]

## 2021-04-04 ENCOUNTER — Ambulatory Visit: Payer: Medicare Other

## 2021-04-04 DIAGNOSIS — M75121 Complete rotator cuff tear or rupture of right shoulder, not specified as traumatic: Secondary | ICD-10-CM | POA: Diagnosis not present

## 2021-04-10 DIAGNOSIS — K5900 Constipation, unspecified: Secondary | ICD-10-CM | POA: Diagnosis not present

## 2021-04-11 ENCOUNTER — Ambulatory Visit (INDEPENDENT_AMBULATORY_CARE_PROVIDER_SITE_OTHER): Payer: Medicare Other

## 2021-04-11 ENCOUNTER — Other Ambulatory Visit: Payer: Self-pay | Admitting: Family Medicine

## 2021-04-11 DIAGNOSIS — J309 Allergic rhinitis, unspecified: Secondary | ICD-10-CM

## 2021-04-17 ENCOUNTER — Encounter: Payer: Self-pay | Admitting: Family Medicine

## 2021-04-23 IMAGING — CT CT ABD-PELV W/ CM
2 of 5 series · 16 of 46 positions shown, 18 images · IV contrast (APPLIED)
Comparison: 03/24/2018

CLINICAL DATA: Epigastric pain/bloating

EXAM:
CT ABDOMEN AND PELVIS WITH CONTRAST
TECHNIQUE: Multidetector CT imaging of the abdomen and pelvis was performed
using the standard protocol following bolus administration of
intravenous contrast.
CONTRAST:  100mL OMNIPAQUE IOHEXOL 300 MG/ML  SOLN

[Series 2: axial st · axial · 0.92mm/px · z∈[+950,+1360]mm · 13 of 94 slices shown, 15 images]
[im 6/94  soft-tissue]
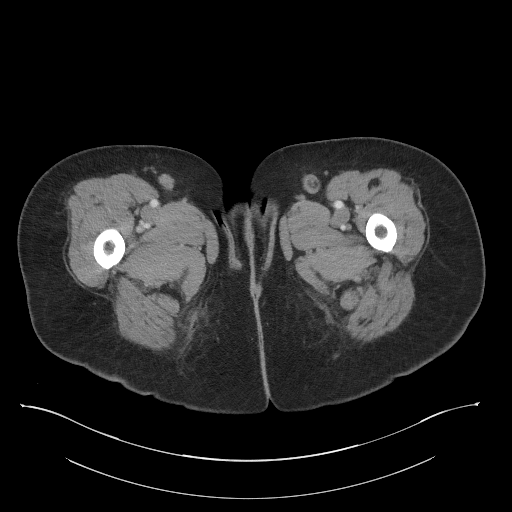
[im 6/94  bone]
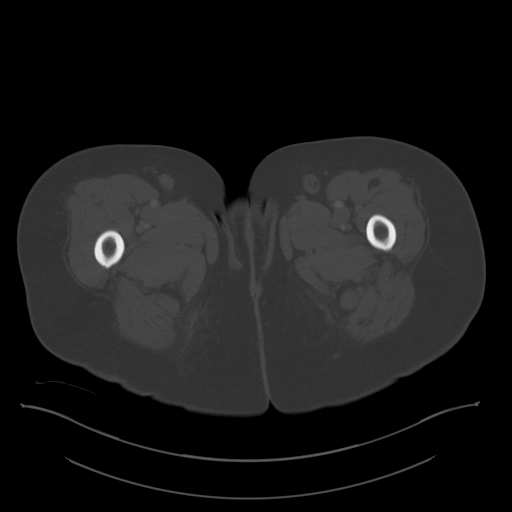
[im 11/94  soft-tissue]
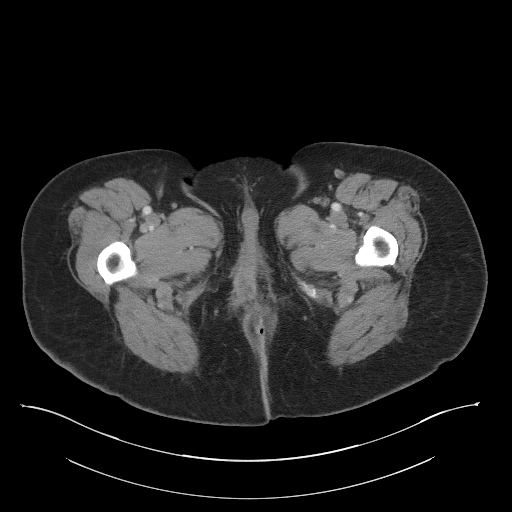
[im 22/94  soft-tissue]
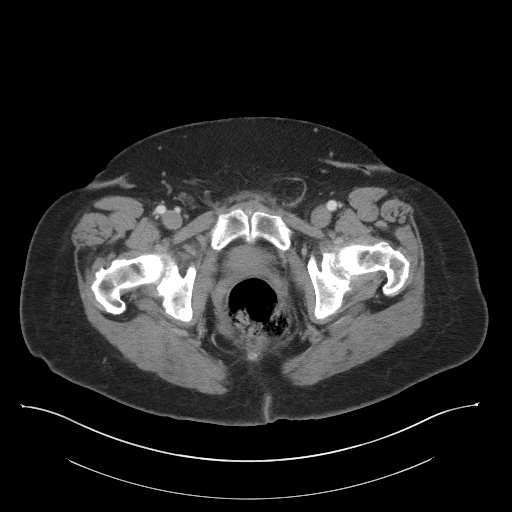
[im 28/94  soft-tissue]
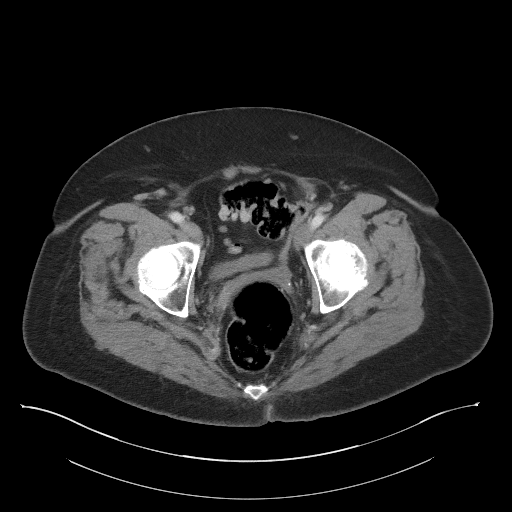
[im 33/94  soft-tissue]
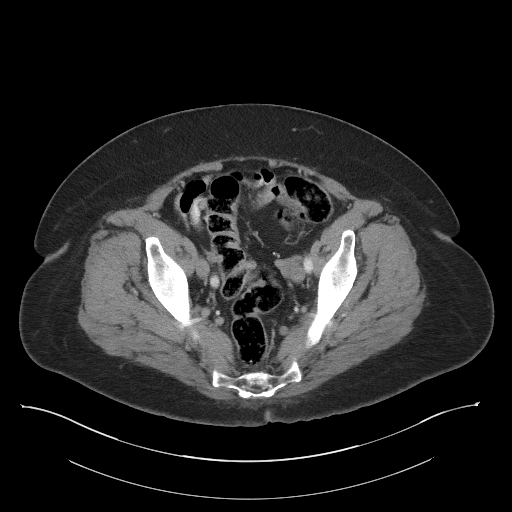
[im 39/94  soft-tissue]
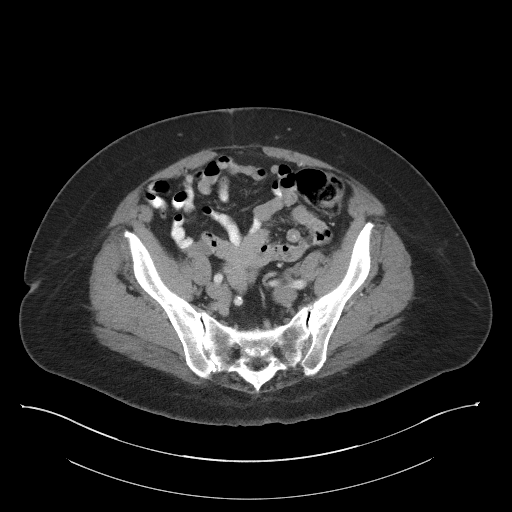
[im 50/94  soft-tissue]
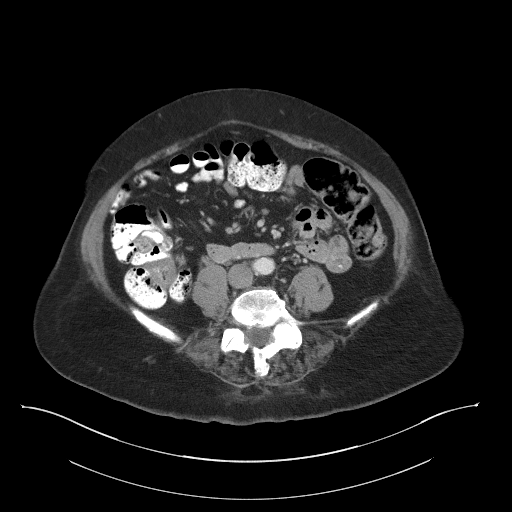
[im 55/94  soft-tissue]
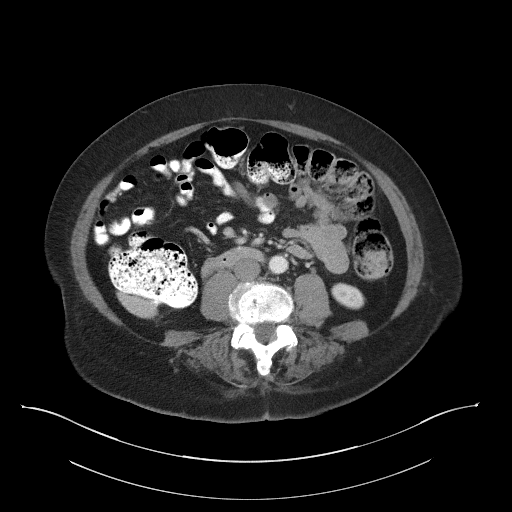
[im 61/94  soft-tissue]
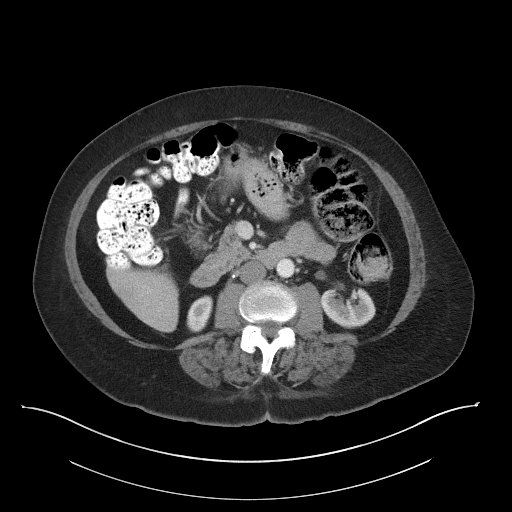
[im 61/94  bone]
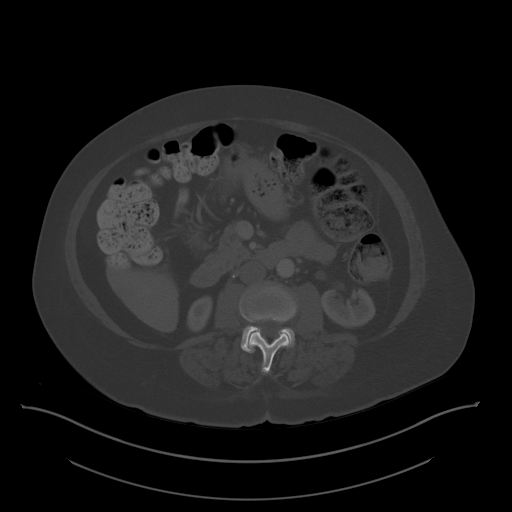
[im 66/94  soft-tissue]
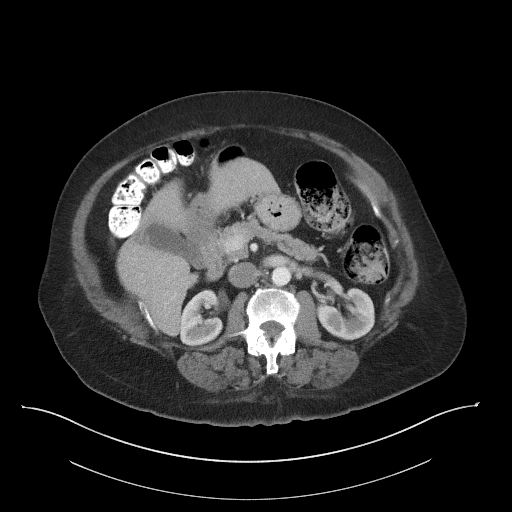
[im 72/94  soft-tissue]
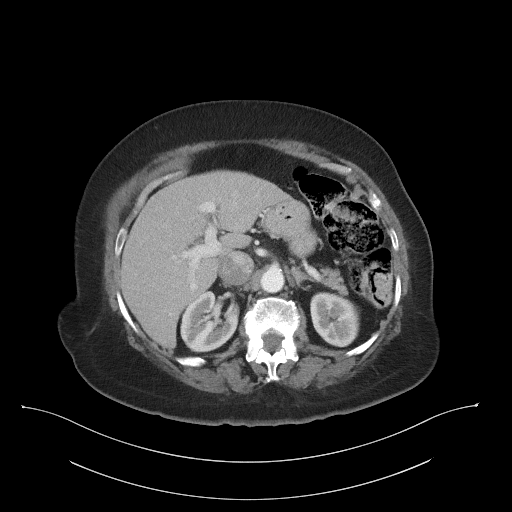
[im 83/94  soft-tissue]
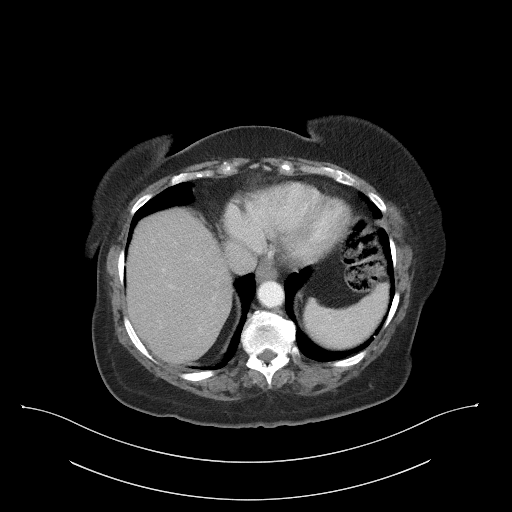
[im 88/94  soft-tissue]
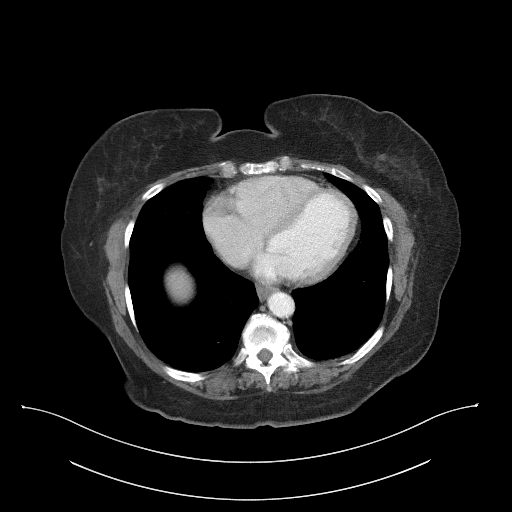

[Series 5: coronal st · coronal · 0.76mm/px · 3 of 103 slices shown]
[im 35/103  soft-tissue]
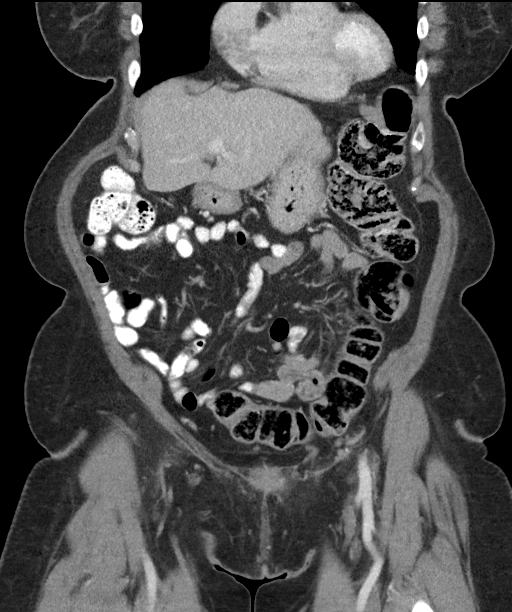
[im 46/103  soft-tissue]
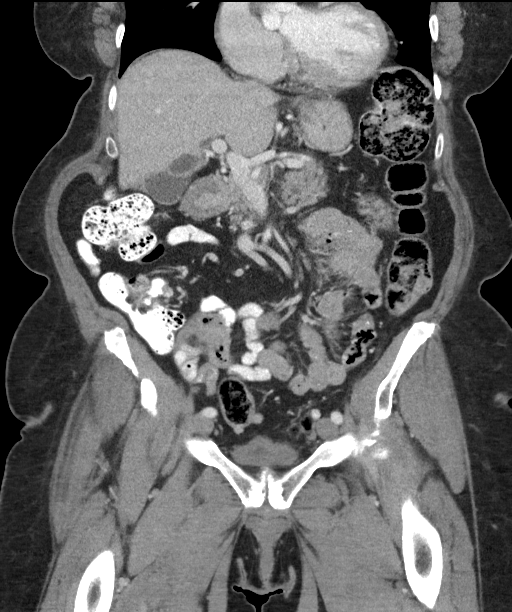
[im 57/103  soft-tissue]
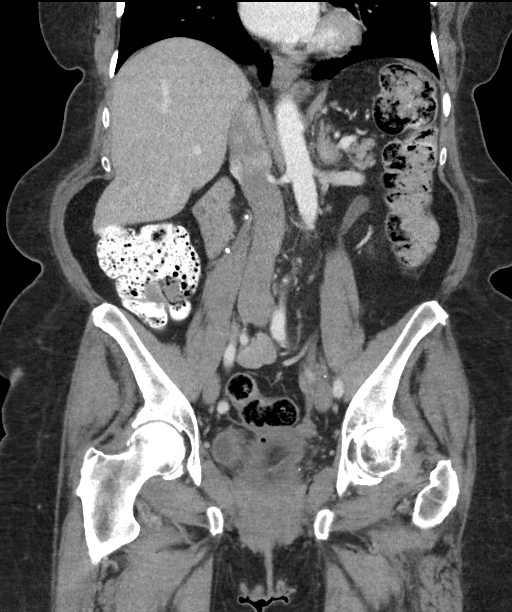

[16 of 46 positions shown; findings below may reference images not displayed]

FINDINGS: Lower chest: Lung bases are clear.

Hepatobiliary: Liver is within normal limits.

Gallbladder is unremarkable. No intrahepatic or extrahepatic ductal
dilatation.

Pancreas: Within normal limits.

Spleen: Within normal limits.

Adrenals/Urinary Tract: Adrenal glands are within normal limits.

Kidneys are within normal limits.  No hydronephrosis.

Bladder is underdistended but unremarkable.

Stomach/Bowel: Stomach is notable for a tiny hiatal hernia.

No evidence of bowel obstruction.

Normal appendix (series 2/image 55).

Mild sigmoid diverticulosis, without evidence of diverticulitis.
Mild to moderate right colonic stool burden.

Vascular/Lymphatic: No evidence of abdominal aortic aneurysm.

Mild atherosclerotic calcifications the abdominal aorta and branch
vessels.

No suspicious abdominopelvic lymphadenopathy.

Reproductive: Status post hysterectomy.

Bilateral ovaries are within normal limits.

Other: No abdominopelvic ascites.

Musculoskeletal: Degenerative changes of the visualized
thoracolumbar spine.
IMPRESSION: Negative CT abdomen/pelvis.

## 2021-05-01 NOTE — Progress Notes (Addendum)
Therapist, music at [facility] ?[address], [address] ?[place], [place] [address] ?336 [phone] ?Fax [phone] ? ?Date:  05/02/2021  ? ?Name:  Stephanie Parks   DOB:  1945/02/28   MRN:  [id number] ? ?PCP:  [name], MD  ? ? ?Chief Complaint: Leg Pain (Pain from thigh to foot, 2 weeks/Needs hip replacement but BP too high) ? ? ?History of Present Illness: ? ?Stephanie Parks is a 75 y.o. very pleasant female patient who presents with the following: ? ?Patient seen today with concern of swelling of her right leg ?Most recent visit with myself was in January ? ?hypertension, hyperlipidemia, allergic rhinitis, OSA on CPAP. She has tended to have frequent ER visits for chest pain likely related to anxiety ?She also had CNOBS-96 complicated by pulmonary embolism in January of last year.  She has completed treatment with Eliquis and is no longer using oxygen ? ?She has noted this swelling in her right leg for about 2 weeks- not painful at all ?It may improve overnight but not always ?The left leg is not swollen that she can appreciate ? ?She feels like her breathing is about the same as normal.  She may get a bit short of breath if she walks quickly ?Her weight is up a few pounds ?Of note she had an echocardiogram last month which showed just minor diastolic dysfunction ? ?Wt Readings from Last 3 Encounters:  ?05/02/21 215 lb 12.8 oz (97.9 kg)  ?03/21/21 212 lb (96.2 kg)  ?03/12/21 207 lb (93.9 kg)  ? ?She is taking potassium 20 meq- alternates 20 and 40 meq  ? ?Patient Active Problem List  ? Diagnosis Date Noted  ? Iron deficiency anemia 03/13/2021  ? Erythropoietin deficiency anemia 03/13/2021  ? Unilateral primary osteoarthritis, left hip 10/18/2020  ? Greater trochanteric bursitis of left hip 10/18/2020  ? Headache 06/07/2020  ? Acute respiratory failure with hypoxia ([place]) 05/02/2020  ? Post-COVID chronic dyspnea 05/02/2020  ? Pulsatile neck mass 04/26/2020  ? Pneumonia due to COVID-19  virus 03/12/2020  ? Headache disorder 01/04/2020  ? Seasonal allergic conjunctivitis 10/04/2019  ? Dysfunction of right eustachian tube 10/04/2019  ? Pre-diabetes 06/02/2019  ? Seasonal and perennial allergic rhinitis 12/22/2018  ? Heart palpitations 12/22/2018  ? History of chest pain 07/20/2018  ? Brain aneurysm 07/20/2018  ? Dyslipidemia 05/04/2018  ? Tendonitis, Achilles, right 12/08/2017  ? Unilateral primary osteoarthritis, left knee 10/07/2017  ? Ingrown toenail 08/10/2017  ? Coughing 04/24/2017  ? CAD (coronary artery disease) 03/01/2015  ? Aneurysm ([place]) 03/01/2015  ? Dizziness 03/01/2015  ? Paresthesia of arm 03/01/2015  ? Achalasia 09/28/2012  ? CN (constipation) 09/28/2012  ? Cephalalgia 08/24/2012  ? Essential hypertension 04/19/2011  ? Hyperlipidemia 04/19/2011  ? GERD (gastroesophageal reflux disease) 04/19/2011  ? Chest pain 04/04/2011  ? ? ?Past Medical History:  ?Diagnosis Date  ? 3-vessel CAD 03/01/2015  ? Achalasia 09/28/2012  ? Allergic rhinitis 03/01/2015  ? Anemia   ? Aneurysm ([place]) 03/01/2015  ? BP (high blood pressure) 03/01/2015  ? Bronchitis 04/23/2018  ? Cephalalgia 08/24/2012  ? Overview:  ICD-10 cut over    ? Chest pain 04/04/2011  ? CN (constipation) 09/28/2012  ? Coughing 04/24/2017  ? Decreased potassium in the blood 03/01/2015  ? Diverticulosis   ? Dizziness 03/01/2015  ? Erythropoietin deficiency anemia 03/13/2021  ? GERD (gastroesophageal reflux disease)   ? Hiatal hernia   ? Hypercholesterolemia 03/01/2015  ? Hyperlipidemia   ?  Hypertension   ? Ingrown toenail 08/10/2017  ? Inguinal hernia   ? Iron deficiency anemia 03/13/2021  ? Migraine headache   ? Onychomycosis 12/08/2017  ? Paresthesia of arm 03/01/2015  ? Schatzki's ring   ? Tendonitis, Achilles, right 12/08/2017  ? Unilateral primary osteoarthritis, left knee 10/07/2017  ? ? ?Past Surgical History:  ?Procedure Laterality Date  ? ABDOMINAL HYSTERECTOMY    ? BACK SURGERY    ? x 3  ? CARPAL TUNNEL RELEASE    ? left wrist  ? CERVICAL SPINE SURGERY     ? HEMORRHOID SURGERY    ? ? ?Social History  ? ?Tobacco Use  ? Smoking status: Former  ?  Packs/day: 0.50  ?  Years: 20.00  ?  Pack years: 10.00  ?  Types: Cigarettes  ?  Quit date: 02/19/1984  ?  Years since quitting: 37.2  ? Smokeless tobacco: Never  ?Vaping Use  ? Vaping Use: Never used  ?Substance Use Topics  ? Alcohol use: No  ? Drug use: No  ? ? ?Family History  ?Problem Relation Age of Onset  ? Allergic rhinitis Sister   ? Diabetes Sister   ? Hypertension Sister   ? Heart attack Father 55  ? Heart disease Father   ? Stomach cancer Maternal Grandmother   ? Heart disease Mother   ? Colon cancer Neg Hx   ? Angioedema Neg Hx   ? Asthma Neg Hx   ? Eczema Neg Hx   ? Urticaria Neg Hx   ? Immunodeficiency Neg Hx   ? Breast cancer Neg Hx   ? Migraines Neg Hx   ? Headache Neg Hx   ? ? ?Allergies  ?Allergen Reactions  ? Shellfish Allergy Hives and Swelling  ? Ace Inhibitors Other (See Comments)  ?  Pt cannot recall her reaction but tolerates arb   ? Gabapentin Other (See Comments)  ?  headaches  ? Pitavastatin   ?  Unknown reaction   ? Topiramate   ?  Heart race,   ? Sulfa Antibiotics Other (See Comments) and Rash  ?  Fine bumps  ? ? ?Medication list has been reviewed and updated. ? ?Current Outpatient Medications on File Prior to Visit  ?Medication Sig Dispense Refill  ? acetaminophen (TYLENOL) 325 MG tablet Take 650 mg by mouth. For headaches    ? alum & mag hydroxide-simeth (MAALOX MAX) 400-400-40 MG/5ML suspension Take 10 mLs by mouth every 6 (six) hours as needed for indigestion. 355 mL 0  ? Calcium Carb-Cholecalciferol (CALCIUM 600 + D PO) Take 1 tablet by mouth daily.    ? carvedilol (COREG) 6.25 MG tablet Take 1 tablet (6.25 mg total) by mouth 2 (two) times daily. 180 tablet 3  ? EPINEPHrine (EPIPEN 2-PAK) 0.3 mg/0.3 mL IJ SOAJ injection Inject 0.3 mLs (0.3 mg total) into the muscle as needed for anaphylaxis. Per allergen immunotherapy protocol 1 each 2  ? fluticasone (FLONASE) 50 MCG/ACT nasal spray Place 2  sprays into both nostrils daily as needed for allergies. 48 g 3  ? losartan (COZAAR) 100 MG tablet TAKE 1 TABLET BY MOUTH EVERY DAY 90 tablet 3  ? METAMUCIL FIBER PO Take by mouth.    ? Multiple Vitamins-Minerals (MULTIVITAL) tablet Take 1 tablet by mouth daily.     ? nortriptyline (PAMELOR) 25 MG capsule Take 2 capsules (50 mg total) by mouth at bedtime. 180 capsule 3  ? omeprazole (PRILOSEC) 40 MG capsule Take 40 mg by mouth daily.    ?  potassium chloride SA (KLOR-CON M) 20 MEQ tablet TAKE 1 TABLET BY MOUTH EVERY DAY 180 tablet 1  ? rosuvastatin (CRESTOR) 10 MG tablet TAKE 1 TABLET BY MOUTH EVERY DAY 90 tablet 3  ? traMADol (ULTRAM) 50 MG tablet Take 1-2 tablets (50-100 mg total) by mouth every 6 (six) hours as needed. 30 tablet 0  ? LINZESS 72 MCG capsule Take 72 mcg by mouth daily.    ? ?No current facility-administered medications on file prior to visit.  ? ? ?Review of Systems: ? ?As per HPI- otherwise negative. ? ? ?Physical Examination: ?Vitals:  ? 05/02/21 1058  ?BP: (!) 142/70  ?Pulse: 84  ?Resp: 20  ?SpO2: 98%  ? ?Vitals:  ? 05/02/21 1058  ?Weight: 215 lb 12.8 oz (97.9 kg)  ?Height: '5\' 5"'$  (1.651 m)  ? ?Body mass index is 35.91 kg/m?. ?Ideal Body Weight: Weight in (lb) to have BMI = 25: 149.9 ? ?GEN: no acute distress.  Obese, looks well ?HEENT: Atraumatic, Normocephalic.  ?Ears and Nose: No external deformity. ?CV: RRR, No M/G/R. No JVD. No thrill. No extra heart sounds. ?PULM: CTA B, no wheezes, crackles, rhonchi. No retractions. No resp. distress. No accessory muscle use. ?EXTR: No c/c/e ?PSYCH: Normally interactive. Conversant.  ?Normal pulses in both feet, feet are warm and well-perfused.  Patient does have some possible swelling at the right medial calf.  She has not tenderness in this area as well.  She denies any recent injury ? ?Assessment and Plan: ?Right leg swelling - Plan: B Nat Peptide, US Venous Img Lower Unilateral Right ? ?Medication monitoring encounter - Plan: CBC ? ?Hypokalemia - Plan:  Basic metabolic panel ? ?Patient seen today with swelling of her right leg.  She had a pulmonary embolism last year in the setting of COVID-19.  No longer taking blood thinners.  We will obtain an [employer]

## 2021-05-02 ENCOUNTER — Encounter: Payer: Self-pay | Admitting: Family Medicine

## 2021-05-02 ENCOUNTER — Ambulatory Visit (INDEPENDENT_AMBULATORY_CARE_PROVIDER_SITE_OTHER): Payer: Medicare Other | Admitting: Family Medicine

## 2021-05-02 ENCOUNTER — Ambulatory Visit (HOSPITAL_BASED_OUTPATIENT_CLINIC_OR_DEPARTMENT_OTHER)
Admission: RE | Admit: 2021-05-02 | Discharge: 2021-05-02 | Disposition: A | Discharge status: Home / Self Care | Payer: Medicare Other | Source: Ambulatory Visit | Attending: Family Medicine | Admitting: Family Medicine

## 2021-05-02 ENCOUNTER — Other Ambulatory Visit: Payer: Self-pay

## 2021-05-02 VITALS — BP 142/70 | HR 84 | Resp 20 | Ht 65.0 in | Wt 215.8 lb

## 2021-05-02 DIAGNOSIS — M7989 Other specified soft tissue disorders: Secondary | ICD-10-CM | POA: Insufficient documentation

## 2021-05-02 DIAGNOSIS — E876 Hypokalemia: Secondary | ICD-10-CM | POA: Diagnosis not present

## 2021-05-02 DIAGNOSIS — Z5181 Encounter for therapeutic drug level monitoring: Secondary | ICD-10-CM

## 2021-05-02 LAB — BASIC METABOLIC PANEL
BUN: 11 mg/dL (ref 6–23)
CO2: 33 mEq/L — ABNORMAL HIGH (ref 19–32)
Calcium: 9.2 mg/dL (ref 8.4–10.5)
Chloride: 104 mEq/L (ref 96–112)
Creatinine, Ser: 0.82 mg/dL (ref 0.40–1.20)
GFR: 69.9 mL/min (ref >60.00)
Glucose, Bld: 94 mg/dL (ref 70–99)
Potassium: 3.5 mEq/L (ref 3.5–5.1)
Sodium: 142 mEq/L (ref 135–145)

## 2021-05-02 LAB — BRAIN NATRIURETIC PEPTIDE: Pro B Natriuretic peptide (BNP): 24 pg/mL (ref 0.0–100.0)

## 2021-05-02 LAB — CBC
HCT: 34.6 % — ABNORMAL LOW (ref 36.0–46.0)
Hemoglobin: 11.5 g/dL — ABNORMAL LOW (ref 12.0–15.0)
MCHC: 33.4 g/dL (ref 30.0–36.0)
MCV: 89.7 fl (ref 78.0–100.0)
Platelets: 286 10*3/uL (ref 150.0–400.0)
RBC: 3.86 Mil/uL — ABNORMAL LOW (ref 3.87–5.11)
RDW: 16.2 % — ABNORMAL HIGH (ref 11.5–15.5)
WBC: 4.8 10*3/uL (ref 4.0–10.5)

## 2021-05-02 NOTE — Patient Instructions (Signed)
Ultrasound today at 2pm!  ?

## 2021-05-08 DIAGNOSIS — K5900 Constipation, unspecified: Secondary | ICD-10-CM | POA: Diagnosis not present

## 2021-05-08 DIAGNOSIS — M6289 Other specified disorders of muscle: Secondary | ICD-10-CM | POA: Diagnosis not present

## 2021-05-11 ENCOUNTER — Ambulatory Visit (INDEPENDENT_AMBULATORY_CARE_PROVIDER_SITE_OTHER): Payer: Medicare Other

## 2021-05-11 DIAGNOSIS — J309 Allergic rhinitis, unspecified: Secondary | ICD-10-CM | POA: Diagnosis not present

## 2021-05-14 NOTE — Telephone Encounter (Signed)
Pt says her sxs are not new and are not worsening. She is aware that she needs to go to the ER should her sxs worsen. She will contact pharmacy as instructed before.  ?

## 2021-05-14 NOTE — Telephone Encounter (Signed)
Left vm to return call.    

## 2021-05-15 ENCOUNTER — Ambulatory Visit: Payer: Medicaid Other | Admitting: Internal Medicine

## 2021-05-15 ENCOUNTER — Telehealth: Payer: Self-pay | Admitting: Pharmacist

## 2021-05-15 DIAGNOSIS — Z5181 Encounter for therapeutic drug level monitoring: Secondary | ICD-10-CM

## 2021-05-15 MED ORDER — SPIRONOLACTONE 25 MG PO TABS: 25.0000 mg | ORAL_TABLET | Freq: Every day | ORAL | 3 refills | Status: DC

## 2021-05-15 NOTE — Telephone Encounter (Signed)
Patient called today- left a VM that she wants to change her carvedilol because it is causing shortness of breath and swelling. ? ?Patient has a long history of swelling and I do not think her carvedilol is the cause. Her PCP instructed her to wear compression stockings which I agree with. She could potentially benefit from a diuretic. She was on Edecrin briefly but it is expensive. She has a sulfa antibiotic allergy (rash, fine bumps). Cross sensitivity is low. She could potentially try furosemide. ? ?Or we could add spironolactone 12.'5mg'$  for both swelling and BP. I do not think she needs to stop carvedilol.  ? ?LVM for patient to call back and discuss ?

## 2021-05-15 NOTE — Telephone Encounter (Signed)
I spoke with patient in regards to below. She insists she come off of carvedilol. Reports ?SOB if she tries to walk fast. Has gained weight. ?Compression stocking make it worse per her report. Too tight. Advised they are supposed to be tight. ?Talked about using furosemide but patient hesitant due to potentially cross reactivity (low risk).  ?Will Stop carvedilol and start spironolactone '25mg'$  daily. Her PCP office is closer and wishes to go there for labs. Will see if Dr. Lorelei Pont would be willing to order BMP for her to have drawn next Wed. ?F/u in office 4/20. ?

## 2021-05-18 ENCOUNTER — Ambulatory Visit: Payer: Medicaid Other

## 2021-05-23 ENCOUNTER — Ambulatory Visit (INDEPENDENT_AMBULATORY_CARE_PROVIDER_SITE_OTHER): Payer: Medicare Other

## 2021-05-23 DIAGNOSIS — J309 Allergic rhinitis, unspecified: Secondary | ICD-10-CM | POA: Diagnosis not present

## 2021-05-30 ENCOUNTER — Telehealth: Payer: Self-pay

## 2021-05-30 DIAGNOSIS — G4733 Obstructive sleep apnea (adult) (pediatric): Secondary | ICD-10-CM | POA: Diagnosis not present

## 2021-05-30 NOTE — Telephone Encounter (Signed)
Patient called and LVM on my machine. I called her back. She reports her blood pressure and HR lately.  ?123/68 HR 100 ?114/57 HR 102 ?Feels a little lightheaded occasionally ?Drank coffee for the first time is a while today and yesterday. ?I advised that she did stop her carvedilol (6.'25mg'$ ) which was lowering her HR (possible a little reflex but she was on a pretty low dose).  ?Continue to monitor BP and HR. Drink plenty of water ?She will go for lab work this week at PCP ?She will follow up with me either Friday or Monday. ?

## 2021-05-30 NOTE — Telephone Encounter (Signed)
Called and lmom pt to please get labs drawn at pcp prior to pharmd appt 06/07/21 at chst office. Will route this to my own pt calls to continue following up.  ?

## 2021-05-30 NOTE — Telephone Encounter (Signed)
-----   Message from Ramond Dial, [place] sent at 05/29/2021 10:23 AM EDT ----- ?Can you remind her that she needs to get her labs drawn at PCP office? ?----- Message ----- ?From: Ramond Dial, RPH-CPP ?Sent: 05/23/2021  12:00 AM EDT ?To: Ramond Dial, RPH-CPP ? ?BMP at PCP ? ? ?

## 2021-05-31 ENCOUNTER — Ambulatory Visit (INDEPENDENT_AMBULATORY_CARE_PROVIDER_SITE_OTHER): Payer: Medicare Other

## 2021-05-31 ENCOUNTER — Encounter: Payer: Self-pay | Admitting: Hematology & Oncology

## 2021-05-31 ENCOUNTER — Other Ambulatory Visit (INDEPENDENT_AMBULATORY_CARE_PROVIDER_SITE_OTHER): Payer: Medicare Other

## 2021-05-31 DIAGNOSIS — Z5181 Encounter for therapeutic drug level monitoring: Secondary | ICD-10-CM

## 2021-05-31 DIAGNOSIS — J309 Allergic rhinitis, unspecified: Secondary | ICD-10-CM | POA: Diagnosis not present

## 2021-05-31 NOTE — Telephone Encounter (Signed)
2nd Lmom the pt to get labs prior to pharmd appt  ?

## 2021-06-01 ENCOUNTER — Encounter: Payer: Self-pay | Admitting: Family Medicine

## 2021-06-01 LAB — BASIC METABOLIC PANEL
BUN/Creatinine Ratio: 17 (ref 12–28)
BUN: 14 mg/dL (ref 8–27)
CO2: 28 mmol/L (ref 20–29)
Calcium: 9.5 mg/dL (ref 8.7–10.3)
Chloride: 102 mmol/L (ref 96–106)
Creatinine, Ser: 0.84 mg/dL (ref 0.57–1.00)
Glucose: 100 mg/dL — ABNORMAL HIGH (ref 70–99)
Potassium: 3.7 mmol/L (ref 3.5–5.2)
Sodium: 143 mmol/L (ref 134–144)
eGFR: 72 mL/min/{1.73_m2} (ref >59)

## 2021-06-04 NOTE — Telephone Encounter (Signed)
3rd and final lmom to get labs prior to appt  ?

## 2021-06-07 ENCOUNTER — Ambulatory Visit (INDEPENDENT_AMBULATORY_CARE_PROVIDER_SITE_OTHER): Payer: Medicare Other

## 2021-06-07 ENCOUNTER — Ambulatory Visit: Payer: Medicare Other

## 2021-06-07 DIAGNOSIS — J309 Allergic rhinitis, unspecified: Secondary | ICD-10-CM | POA: Diagnosis not present

## 2021-06-15 ENCOUNTER — Ambulatory Visit: Payer: Medicare Other | Admitting: Pharmacist

## 2021-06-15 VITALS — BP 128/60 | HR 108

## 2021-06-15 DIAGNOSIS — I10 Essential (primary) hypertension: Secondary | ICD-10-CM | POA: Diagnosis not present

## 2021-06-15 NOTE — Progress Notes (Signed)
Patient ID: Stephanie Parks                 DOB: 25-Sep-1945                      MRN: [id number] ? ? ? ? ?HPI: ?Stephanie Parks is a 76 y.o. female referred by Dr. Irish Parks to HTN clinic. PMH is significant for HTN, HLD, brain aneurysm, palpitations, frequent trips to urgent care ER for chest pain, cath with no PCI needed and knee arthritis needing knee replacement. Patient saw Dr. Irish Parks on 11/2. Her metoprolol was stopped and she was started on amlodipine. Per Dr. Irish Parks note, patient has LLE at baseline. Patient was seen in the ED on 11/13 for chest pain, complained of swelling of her legs and her amlodipine was stopped. She was started on carvedilol. She was seen again in the ED on 11/16 for chest pain/burning. Thought to be from GERD. Advised to use Mallox. ?Patient seen initially in Nov 2022 by pharmD. Had swelling in her foot that she thought was from carvedilol. BP was well controlled and she decided to stay on same therapy. She called office in Dec stating she wanted to get off of carvedilol. She was switched to spironolactone 12.'5mg'$  daily.  ?She saw Dr. Larose Parks on 12/20 for swelling/HTN. Spironolactone was stopped and carvedilol was resumed. Edecrin was started due to sulfa allergy. Still with SOB. PE ruled out, found to have very mild iron deficiency anemia which Dr. Marin Parks did not feel was the cause of her symptoms.  ? ?Patient last seen in clinic 2/14. She was not taking her carvedilol twice a day. I asked her to take it every 12 hours. Patient called office on 3/28 insisting that carvedilol was causing her to swell and she wanted to stop taking it. She was agreeable to re-trying spironolactone '25mg'$  daily. Patient called office on 4/12 reporting BP of 123/68 and 114/57 with HR ~100. Her follow up lab work was stable and HR came down.  ? ?Patient presents today to clinic. She states her home BP has been in the 500'X systolic. I have not verified her home BP cuff. States he has an upper arm cuff and  rests 5-10 min before checking. States that her HR has been elevated. Feels like her heart is racing. Can happen just when she is sitting there. She is checking her HR in the afternoon. Denies dizziness/lightheadedness. Has a lot of pain from her hip and shoulder. Wants to get her blood pressure down so she can have a hip replacement. Patient states she has swelling. Very mild on exam. ? ? ?Current HTN meds: spironolactone '25mg'$  daily, losartan '100mg'$  daily  ?Previously tried: spironolactone, irbesartan, amlodipine ?BP goal: <130/80 ? ?Family History: The patient's family history includes Allergic rhinitis in her sister; Diabetes in her sister; Heart attack (age of onset: 50) in her father; Heart disease in her father and mother; Hypertension in her sister; Stomach cancer in her maternal grandmother.  ? ?Social History: The patient reports that she quit smoking about 36 years ago. Her smoking use included cigarettes. She has a 10.00 pack-year smoking history. She has never used smokeless tobacco. She reports that she does not drink alcohol and does not use drugs.  ? ?Diet: coffee once or twice a week, tea very seldom, gingerale, chocolate ? ?Exercise: doing left lifts, walking doesn't bother her hip as much ? ?Home BP readings: 160/74-80 ? ?Wt Readings from Last 3 Encounters:  ?05/02/21  215 lb 12.8 oz (97.9 kg)  ?03/21/21 212 lb (96.2 kg)  ?03/12/21 207 lb (93.9 kg)  ? ?BP Readings from Last 3 Encounters:  ?05/02/21 (!) 142/70  ?04/03/21 (!) 142/68  ?03/25/21 (!) 154/76  ? ?Pulse Readings from Last 3 Encounters:  ?05/02/21 84  ?04/03/21 91  ?03/25/21 70  ? ? ?Renal function: ?CrCl cannot be calculated (Unknown ideal weight.). ? ?Past Medical History:  ?Diagnosis Date  ? 3-vessel CAD 03/01/2015  ? Achalasia 09/28/2012  ? Allergic rhinitis 03/01/2015  ? Anemia   ? Aneurysm ([place]) 03/01/2015  ? BP (high blood pressure) 03/01/2015  ? Bronchitis 04/23/2018  ? Cephalalgia 08/24/2012  ? Overview:  ICD-10 cut over    ? Chest pain  04/04/2011  ? CN (constipation) 09/28/2012  ? Coughing 04/24/2017  ? Decreased potassium in the blood 03/01/2015  ? Diverticulosis   ? Dizziness 03/01/2015  ? Erythropoietin deficiency anemia 03/13/2021  ? GERD (gastroesophageal reflux disease)   ? Hiatal hernia   ? Hypercholesterolemia 03/01/2015  ? Hyperlipidemia   ? Hypertension   ? Ingrown toenail 08/10/2017  ? Inguinal hernia   ? Iron deficiency anemia 03/13/2021  ? Migraine headache   ? Onychomycosis 12/08/2017  ? Paresthesia of arm 03/01/2015  ? Schatzki's ring   ? Tendonitis, Achilles, right 12/08/2017  ? Unilateral primary osteoarthritis, left knee 10/07/2017  ? ? ?Current Outpatient Medications on File Prior to Visit  ?Medication Sig Dispense Refill  ? acetaminophen (TYLENOL) 325 MG tablet Take 650 mg by mouth. For headaches    ? alum & mag hydroxide-simeth (MAALOX MAX) 400-400-40 MG/5ML suspension Take 10 mLs by mouth every 6 (six) hours as needed for indigestion. 355 mL 0  ? Calcium Carb-Cholecalciferol (CALCIUM 600 + D PO) Take 1 tablet by mouth daily.    ? EPINEPHrine (EPIPEN 2-PAK) 0.3 mg/0.3 mL IJ SOAJ injection Inject 0.3 mLs (0.3 mg total) into the muscle as needed for anaphylaxis. Per allergen immunotherapy protocol 1 each 2  ? fluticasone (FLONASE) 50 MCG/ACT nasal spray Place 2 sprays into both nostrils daily as needed for allergies. 48 g 3  ? LINZESS 72 MCG capsule Take 72 mcg by mouth daily.    ? losartan (COZAAR) 100 MG tablet TAKE 1 TABLET BY MOUTH EVERY DAY 90 tablet 3  ? METAMUCIL FIBER PO Take by mouth.    ? Multiple Vitamins-Minerals (MULTIVITAL) tablet Take 1 tablet by mouth daily.     ? nortriptyline (PAMELOR) 25 MG capsule Take 2 capsules (50 mg total) by mouth at bedtime. 180 capsule 3  ? omeprazole (PRILOSEC) 40 MG capsule Take 40 mg by mouth daily.    ? potassium chloride SA (KLOR-CON M) 20 MEQ tablet TAKE 1 TABLET BY MOUTH EVERY DAY 180 tablet 1  ? rosuvastatin (CRESTOR) 10 MG tablet TAKE 1 TABLET BY MOUTH EVERY DAY 90 tablet 3  ?  spironolactone (ALDACTONE) 25 MG tablet Take 1 tablet (25 mg total) by mouth daily. 90 tablet 3  ? traMADol (ULTRAM) 50 MG tablet Take 1-2 tablets (50-100 mg total) by mouth every 6 (six) hours as needed. 30 tablet 0  ? ?No current facility-administered medications on file prior to visit.  ? ? ?Allergies  ?Allergen Reactions  ? Shellfish Allergy Hives and Swelling  ? Ace Inhibitors Other (See Comments)  ?  Pt cannot recall her reaction but tolerates arb   ? Gabapentin Other (See Comments)  ?  headaches  ? Pitavastatin   ?  Unknown reaction   ? Topiramate   ?  Heart race,   ? Sulfa Antibiotics Other (See Comments) and Rash  ?  Fine bumps  ? ? ?There were no vitals taken for this visit. ? ? ?Assessment/Plan: ? ?1. Hypertension - BP is above at goal of <130/80 today in clinic. Per pt report BP is high at home. States it will be good one day and then high the next. Appears to possibly be driven by pain and anxiety. We discussed breathing techniques for calming especially when she feels like her heart is racing. Home BP has not been validated. I have asked her to bring cuff and her log to next appointment. Patient wore monitor in past that showed NSR with some PAC, PVC and some sinus tach. She was on carvedilol but insisted it was causing her swelling and thus was changed to spironolactone.  Continue losartan '100mg'$  and spironolactone '25mg'$  daily for now. Could consider another beta blocker or diltiazem in the future.  ? ?Thank you ? ?Ramond Dial, Pharm.D, BCPS, CPP ?[facility] [place], [place] [address]  ?Phone: [phone]; Fax: [phone]  ? ? ?

## 2021-06-15 NOTE — Patient Instructions (Addendum)
Continue spironolactone '25mg'$  daily, losartan '100mg'$  daily  ?Try breathing techniques to help calm you inhale (belly breath) for 4 seconds and exhale for 4 seconds ?Keep stretching. Start stretching in the evening ?Call me [phone] with any issues ?Please bring blood pressure cuff and log with you to next appointment. ?

## 2021-06-18 DIAGNOSIS — R198 Other specified symptoms and signs involving the digestive system and abdomen: Secondary | ICD-10-CM | POA: Diagnosis not present

## 2021-06-18 DIAGNOSIS — K5900 Constipation, unspecified: Secondary | ICD-10-CM | POA: Diagnosis not present

## 2021-06-18 DIAGNOSIS — R278 Other lack of coordination: Secondary | ICD-10-CM | POA: Diagnosis not present

## 2021-06-18 DIAGNOSIS — M6281 Muscle weakness (generalized): Secondary | ICD-10-CM | POA: Diagnosis not present

## 2021-06-20 ENCOUNTER — Ambulatory Visit (HOSPITAL_BASED_OUTPATIENT_CLINIC_OR_DEPARTMENT_OTHER)
Admission: RE | Admit: 2021-06-20 | Discharge: 2021-06-20 | Disposition: A | Discharge status: Home / Self Care | Payer: Medicare Other | Source: Ambulatory Visit | Attending: Family Medicine | Admitting: Family Medicine

## 2021-06-20 ENCOUNTER — Ambulatory Visit (INDEPENDENT_AMBULATORY_CARE_PROVIDER_SITE_OTHER): Payer: Medicare Other | Admitting: Family Medicine

## 2021-06-20 ENCOUNTER — Encounter: Payer: Self-pay | Admitting: Family Medicine

## 2021-06-20 VITALS — BP 133/64 | HR 82 | Ht 65.0 in | Wt 219.4 lb

## 2021-06-20 DIAGNOSIS — R140 Abdominal distension (gaseous): Secondary | ICD-10-CM | POA: Insufficient documentation

## 2021-06-20 DIAGNOSIS — Z8719 Personal history of other diseases of the digestive system: Secondary | ICD-10-CM | POA: Insufficient documentation

## 2021-06-20 DIAGNOSIS — M79661 Pain in right lower leg: Secondary | ICD-10-CM

## 2021-06-20 DIAGNOSIS — M47816 Spondylosis without myelopathy or radiculopathy, lumbar region: Secondary | ICD-10-CM | POA: Diagnosis not present

## 2021-06-20 DIAGNOSIS — M160 Bilateral primary osteoarthritis of hip: Secondary | ICD-10-CM | POA: Diagnosis not present

## 2021-06-20 DIAGNOSIS — R635 Abnormal weight gain: Secondary | ICD-10-CM

## 2021-06-20 DIAGNOSIS — I878 Other specified disorders of veins: Secondary | ICD-10-CM | POA: Diagnosis not present

## 2021-06-20 LAB — COMPREHENSIVE METABOLIC PANEL
ALT: 13 U/L (ref 0–35)
AST: 18 U/L (ref 0–37)
Albumin: 3.9 g/dL (ref 3.5–5.2)
Alkaline Phosphatase: 81 U/L (ref 39–117)
BUN: 10 mg/dL (ref 6–23)
CO2: 33 mEq/L — ABNORMAL HIGH (ref 19–32)
Calcium: 9.4 mg/dL (ref 8.4–10.5)
Chloride: 104 mEq/L (ref 96–112)
Creatinine, Ser: 0.76 mg/dL (ref 0.40–1.20)
GFR: 76.5 mL/min (ref >60.00)
Glucose, Bld: 86 mg/dL (ref 70–99)
Potassium: 3.7 mEq/L (ref 3.5–5.1)
Sodium: 142 mEq/L (ref 135–145)
Total Bilirubin: 0.5 mg/dL (ref 0.2–1.2)
Total Protein: 6.4 g/dL (ref 6.0–8.3)

## 2021-06-20 LAB — CBC
HCT: 34.9 % — ABNORMAL LOW (ref 36.0–46.0)
Hemoglobin: 11.5 g/dL — ABNORMAL LOW (ref 12.0–15.0)
MCHC: 32.8 g/dL (ref 30.0–36.0)
MCV: 90.7 fl (ref 78.0–100.0)
Platelets: 250 10*3/uL (ref 150.0–400.0)
RBC: 3.85 Mil/uL — ABNORMAL LOW (ref 3.87–5.11)
RDW: 15 % (ref 11.5–15.5)
WBC: 5.2 10*3/uL (ref 4.0–10.5)

## 2021-06-20 LAB — BRAIN NATRIURETIC PEPTIDE: Pro B Natriuretic peptide (BNP): 28 pg/mL (ref 0.0–100.0)

## 2021-06-20 LAB — TSH: TSH: 2.6 u[IU]/mL (ref 0.35–5.50)

## 2021-06-20 NOTE — Patient Instructions (Signed)
1. Weight gain ?Let's check some labs to make sure you are not retaining any fluid. Adding thyroid level at your request.  ?- CBC ?- Comprehensive metabolic panel ?- Brain natriuretic peptide ?- TSH ? ?2. Right calf pain ?Negative ultrasound in March, so we will just start with a blood test. This could be musculoskeletal - recommend ice, heat, stretches, occasional ibuprofen/tylenol. If no signs of clots and not improving with supportive measures we can refer to sports medicine.  ?- D-Dimer, Quantitative ? ?3. Abdominal bloating ?4. History of constipation ?We will get some basic labs and go ahead and check an abdominal xray to ensure this is not constipation. No distention/tightness felt on exam which is a good sign. If this gets worse or you develop any new symptoms please let us know.  ?- CBC ?- Comprehensive metabolic panel ?- DG Abd 1 View; Future ? ? ?Please contact office for follow-up if symptoms do not improve or worsen. Seek emergency care if symptoms become severe. ? ? ?

## 2021-06-20 NOTE — Progress Notes (Signed)
? ?Acute Office Visit ? ?Subjective:  ? ?  ?Patient ID: Stephanie Parks, female    DOB: 20-Oct-1945, 76 y.o.   MRN: [id number] ? ?CC: weight gain, bloating, calf pain  ? ? ?HPI ?Patient is in today for weight gain, bloating, calf pain. ? ?Patient reports for the past week or so she has been noticing some weight gain (per our records she has gained about  3-4 pounds in the past 6 weeks). She states the she feels like her belly is bigger and she feel bloated. Bloating is worse after meals. She denies any pain, diarrhea, blood in stool, nausea, vomiting. States she does have a history of constipation but had a normal BM yesterday. She stopped taking Linzess because it was too expensive, and has switched to Metamucil but states she hasn't seen a significant improvement. Additionally she reports some occasional ankle swelling, but not pitting/weeping. No chest pain, cough, dyspnea at rest, wheezing. She also is concerned about right calf tingling and pain with dorsiflexion, walking. She denies any bruising, ankle/foot pain, numbness, tingling.  ? ? ? ?ROS ?All review of systems negative except what is listed in the HPI ? ? ?   ?Objective:  ?  ?BP 133/64   Pulse 82   Ht '5\' 5"'$  (1.651 m)   Wt 219 lb 6.4 oz (99.5 kg)   BMI 36.51 kg/m?  ?  ? ?Physical Exam ?Vitals reviewed.  ?Constitutional:   ?   Appearance: Normal appearance. She is obese.  ?Cardiovascular:  ?   Rate and Rhythm: Normal rate and regular rhythm.  ?Pulmonary:  ?   Effort: Pulmonary effort is normal.  ?   Breath sounds: Normal breath sounds.  ?Abdominal:  ?   General: Bowel sounds are normal. There is no distension.  ?   Palpations: Abdomen is soft. There is no mass.  ?   Tenderness: There is no abdominal tenderness. There is no guarding or rebound.  ?Musculoskeletal:     ?   General: No swelling.  ?   Right lower leg: No edema.  ?   Left lower leg: No edema.  ?   Comments: + homans sign (right)  ?Skin: ?   General: Skin is warm and dry.  ?   Findings: No  erythema.  ?Neurological:  ?   General: No focal deficit present.  ?   Mental Status: She is alert and oriented to person, place, and time. Mental status is at baseline.  ?   Motor: No weakness.  ?   Coordination: Coordination normal.  ?   Gait: Gait normal.  ?Psychiatric:     ?   Mood and Affect: Mood normal.     ?   Behavior: Behavior normal.     ?   Thought Content: Thought content normal.     ?   Judgment: Judgment normal.  ? ? ?No results found for any visits on 06/20/21. ? ? ?   ?Assessment & Plan:  ? ?1. Weight gain ?Let's check some labs to make sure you are not retaining any fluid. Adding thyroid level at your request.  ?- CBC ?- Comprehensive metabolic panel ?- Brain natriuretic peptide ?- TSH ? ?2. Right calf pain ?Negative ultrasound in March, so we will just start with a blood test. This could be musculoskeletal - recommend ice, heat, stretches, occasional ibuprofen/tylenol. If no signs of clots and not improving with supportive measures we can refer to sports medicine.  ?- D-Dimer, Quantitative ? ?3. Abdominal  bloating ?4. History of constipation ?We will get some basic labs and go ahead and check an abdominal xray to ensure this is not constipation. No distention/tightness felt on exam which is a good sign. If this gets worse or you develop any new symptoms please let us know.  ?- CBC ?- Comprehensive metabolic panel ?- DG Abd 1 View; Future ? ? ? ? ?Return if symptoms worsen or fail to improve. ? ?Terrilyn Saver, NP ? ? ?

## 2021-06-21 ENCOUNTER — Ambulatory Visit (INDEPENDENT_AMBULATORY_CARE_PROVIDER_SITE_OTHER): Payer: Medicare Other | Admitting: Orthopaedic Surgery

## 2021-06-21 VITALS — Ht 65.0 in | Wt 219.4 lb

## 2021-06-21 DIAGNOSIS — M25552 Pain in left hip: Secondary | ICD-10-CM | POA: Diagnosis not present

## 2021-06-21 DIAGNOSIS — M1612 Unilateral primary osteoarthritis, left hip: Secondary | ICD-10-CM | POA: Diagnosis not present

## 2021-06-21 LAB — D-DIMER, QUANTITATIVE: D-Dimer, Quant: 0.81 mcg/mL FEU — ABNORMAL HIGH (ref <0.50)

## 2021-06-21 NOTE — Progress Notes (Signed)
The patient is well-known to Stephanie Parks.  She is a 76 year old female who has debilitating end-stage arthritis of her left hip that is been well-documented with plain films and a MRI of the left hip.  This is been going on for at least 3 years now.  She had been in and out of the ER with chest pain and they feel this may be anxiety related.  She had been on Eliquis but is off of blood thinners now.  She does have fluctuations with her high blood pressure and she is monitoring this closely.  She is ambulate with a cane and has severe debilitating left hip pain at this point.  It is 10 out of 10 and daily and it is detrimentally affecting her mobility, her quality of life and her actives daily living.  She is at the point where she is interested in hip replacement surgery.  She has also had an intra-articular injection in her left hip that did help for a while but is not helping now. ? ?On exam she has severe pain with attempts of internal and external rotation of her left hip.  It does hurt in the groin.  I did review her previous MRI and plain films of her left hip showing severe osteoarthritis. ? ?At this point I am okay with working on scheduling to have her left hip replaced.  I described what the surgery involves as well as the risk and benefits of surgery.  We talked about what to expect from an intraoperative and postoperative course.  She will need at least clearance from her primary care physician or cardiologist given her blood pressure issues.  She has an appoint with them later this month.  I gave her my card to give them to send Stephanie Parks notes for her being cleared for surgery and we can go ahead and work on getting her on the schedule since I am usually booked out 5 to 6 weeks.  All question concerns were answered and addressed. ?

## 2021-06-21 NOTE — Addendum Note (Signed)
Addended by: Caleen Jobs B on: 06/21/2021 08:53 AM ? ? Modules accepted: Orders ? ?

## 2021-06-22 ENCOUNTER — Telehealth: Payer: Self-pay | Admitting: Orthopaedic Surgery

## 2021-06-22 ENCOUNTER — Other Ambulatory Visit: Payer: Self-pay | Admitting: Orthopaedic Surgery

## 2021-06-22 MED ORDER — TIZANIDINE HCL 4 MG PO TABS: 4.0000 mg | ORAL_TABLET | Freq: Three times a day (TID) | ORAL | 0 refills | Status: DC | PRN

## 2021-06-22 NOTE — Telephone Encounter (Signed)
I called patient and advised. 

## 2021-06-22 NOTE — Telephone Encounter (Signed)
Patient called advised the muscle relaxer is not showing sent to the pharmacy yet. Patient asked if she can get a call back when Rx is sent to the pharmacy?  The number to contact patient is [phone] ?

## 2021-06-22 NOTE — Telephone Encounter (Signed)
Please advise. I did not see where muscle relaxer was sent in for patient. ?Thanks. ?

## 2021-06-25 DIAGNOSIS — J3089 Other allergic rhinitis: Secondary | ICD-10-CM | POA: Diagnosis not present

## 2021-06-25 NOTE — Progress Notes (Signed)
VIAL EXP 06-26-22 ?

## 2021-06-26 ENCOUNTER — Encounter: Payer: Self-pay | Admitting: *Deleted

## 2021-06-28 ENCOUNTER — Ambulatory Visit (HOSPITAL_BASED_OUTPATIENT_CLINIC_OR_DEPARTMENT_OTHER)
Admission: RE | Admit: 2021-06-28 | Discharge: 2021-06-28 | Disposition: A | Discharge status: Home / Self Care | Payer: Medicare Other | Source: Ambulatory Visit | Attending: Family Medicine | Admitting: Family Medicine

## 2021-06-28 ENCOUNTER — Telehealth: Payer: Self-pay | Admitting: *Deleted

## 2021-06-28 DIAGNOSIS — M79661 Pain in right lower leg: Secondary | ICD-10-CM | POA: Diagnosis not present

## 2021-06-28 NOTE — Telephone Encounter (Signed)
Tele pre op appt 07/02/21 @ 3 pm. Med rec and consent are done.  ?

## 2021-06-28 NOTE — Telephone Encounter (Signed)
? ?  Pre-operative Risk Assessment  ?  ?Patient Name: Stephanie Parks  ?DOB: [birth year] ?MRN: [id number]  ? ?  ? ?Request for Surgical Clearance   ? ?Procedure:   LEFT TOTAL HIP ARTHROPLASTY ? ?Date of Surgery:  Clearance TBD                              ? ?Surgeon:  DR. Jean Rosenthal ?Surgeon's Group or Practice Name:  [facility] ?Phone number:  [phone] ?Fax number:  [phone] ATTN: SHERRIE ?  ?Type of Clearance Requested:   ?- Medical  ?  ?Type of Anesthesia:  Spinal ?  ?Additional requests/questions:   ? ?Signed, ?Julaine Hua   ?06/28/2021, 8:10 AM  ? ?

## 2021-06-28 NOTE — Telephone Encounter (Signed)
Pt returning nurses call regarding Pre Op appt. Please advise 

## 2021-06-28 NOTE — Telephone Encounter (Signed)
Preoperative team, please contact this patient and set up a phone call appointment for further cardiac evaluation.  Thank you for your help. ? ?Jossie Ng. Estelle Skibicki NP-C ? ?  ?06/28/2021, 11:15 AM ?[facility] ?[address] ?Office [phone] Fax [phone] ? ?

## 2021-06-28 NOTE — Telephone Encounter (Signed)
Left message for the pt to call for tele pre op appt 

## 2021-06-28 NOTE — Telephone Encounter (Signed)
Tele pre op appt 07/02/21 @ 3 pm. Med rec and consent are done.  ? ?  ?Patient Consent for Virtual Visit  ? ? ?   ? ?Stephanie Parks has provided verbal consent on 06/28/2021 for a virtual visit (video or telephone). ? ? ?CONSENT FOR VIRTUAL VISIT FOR:  Stephanie Parks  ?By participating in this virtual visit I agree to the following: ? ?I hereby voluntarily request, consent and authorize [facility] and its employed or contracted physicians, physician assistants, nurse practitioners or other licensed health care professionals (the Practitioner), to provide me with telemedicine health care services (the ?Services") as deemed necessary by the treating Practitioner. I acknowledge and consent to receive the Services by the Practitioner via telemedicine. I understand that the telemedicine visit will involve communicating with the Practitioner through live audiovisual communication technology and the disclosure of certain medical information by electronic transmission. I acknowledge that I have been given the opportunity to request an in-person assessment or other available alternative prior to the telemedicine visit and am voluntarily participating in the telemedicine visit. ? ?I understand that I have the right to withhold or withdraw my consent to the use of telemedicine in the course of my care at any time, without affecting my right to future care or treatment, and that the Practitioner or I may terminate the telemedicine visit at any time. I understand that I have the right to inspect all information obtained and/or recorded in the course of the telemedicine visit and may receive copies of available information for a reasonable fee.  I understand that some of the potential risks of receiving the Services via telemedicine include:  ?Delay or interruption in medical evaluation due to technological equipment failure or disruption; ?Information transmitted may not be sufficient (e.g. poor resolution of images) to  allow for appropriate medical decision making by the Practitioner; and/or  ?In rare instances, security protocols could fail, causing a breach of personal health information. ? ?Furthermore, I acknowledge that it is my responsibility to provide information about my medical history, conditions and care that is complete and accurate to the best of my ability. I acknowledge that Practitioner's advice, recommendations, and/or decision may be based on factors not within their control, such as incomplete or inaccurate data provided by me or distortions of diagnostic images or specimens that may result from electronic transmissions. I understand that the practice of medicine is not an exact science and that Practitioner makes no warranties or guarantees regarding treatment outcomes. I acknowledge that a copy of this consent can be made available to me via my patient portal ([facility]), or I can request a printed copy by calling the office of [facility].   ? ?I understand that my insurance will be billed for this visit.  ? ?I have read or had this consent read to me. ?I understand the contents of this consent, which adequately explains the benefits and risks of the Services being provided via telemedicine.  ?I have been provided ample opportunity to ask questions regarding this consent and the Services and have had my questions answered to my satisfaction. ?I give my informed consent for the services to be provided through the use of telemedicine in my medical care ? ? ? ?

## 2021-07-02 ENCOUNTER — Telehealth: Payer: Self-pay | Admitting: Family Medicine

## 2021-07-02 ENCOUNTER — Encounter: Payer: Self-pay | Admitting: Nurse Practitioner

## 2021-07-02 ENCOUNTER — Encounter: Payer: Self-pay | Admitting: Family Medicine

## 2021-07-02 ENCOUNTER — Ambulatory Visit (INDEPENDENT_AMBULATORY_CARE_PROVIDER_SITE_OTHER): Payer: Medicare Other | Admitting: Nurse Practitioner

## 2021-07-02 DIAGNOSIS — Z01810 Encounter for preprocedural cardiovascular examination: Secondary | ICD-10-CM | POA: Diagnosis not present

## 2021-07-02 NOTE — Telephone Encounter (Signed)
Pt states she seen Stephanie Parks on 5/3 who informed her to take Miralax to move bowels. Pt states she's tried cranberry juice as well but it's not helping. Pt would like suggestions or rx  ?

## 2021-07-02 NOTE — Telephone Encounter (Signed)
Please advise 

## 2021-07-02 NOTE — Progress Notes (Signed)
? ?Virtual Visit via Telephone Note  ? ?This visit type was conducted due to national recommendations for restrictions regarding the COVID-19 Pandemic (e.g. social distancing) in an effort to limit this patient's exposure and mitigate transmission in our community.  Due to her co-morbid illnesses, this patient is at least at moderate risk for complications without adequate follow up.  This format is felt to be most appropriate for this patient at this time.  The patient did not have access to video technology/had technical difficulties with video requiring transitioning to audio format only (telephone).  All issues noted in this document were discussed and addressed.  No physical exam could be performed with this format.  Please refer to the patient's chart for her  consent to telehealth for [facility]. ? ?Evaluation Performed:  Preoperative cardiovascular risk assessment ?_____________  ? ?Date:  07/02/2021  ? ?Patient ID:  Stephanie Parks, Stephanie Parks 27-Apr-1945, MRN [id number] ?Patient Location:  ?Home ?Provider location:   ?Office ? ?Primary Care Provider:  [name], MD ?Primary Cardiologist:  Larae Grooms, MD ? ?Chief Complaint  ?  ?77 y.o. y/o female with a h/o nonobstructive CAD (catheterization at [facility] in 2020 showed no evidence of CAD), persistent palpitations (cardiac monitor in 2021 showed sinus rhythm with rare PACs and PVCs, no significant arrhythmia), hypertension (followed in the hypertension clinic), hyperlipidemia, chronic bilateral lower extremity edema, arthritis, and brain aneurysm, who is pending L total hip arthroplasty, date TBD, with Dr. Jean Rosenthal of [facility], and presents today for telephonic preoperative cardiovascular risk assessment. ? ?Past Medical History  ?  ?Past Medical History:  ?Diagnosis Date  ? 3-vessel CAD 03/01/2015  ? Achalasia 09/28/2012  ? Allergic rhinitis 03/01/2015  ? Anemia   ? Aneurysm ([place]) 03/01/2015  ? BP (high blood pressure)  03/01/2015  ? Bronchitis 04/23/2018  ? Cephalalgia 08/24/2012  ? Overview:  ICD-10 cut over    ? Chest pain 04/04/2011  ? CN (constipation) 09/28/2012  ? Coughing 04/24/2017  ? Decreased potassium in the blood 03/01/2015  ? Diverticulosis   ? Dizziness 03/01/2015  ? Erythropoietin deficiency anemia 03/13/2021  ? GERD (gastroesophageal reflux disease)   ? Hiatal hernia   ? Hypercholesterolemia 03/01/2015  ? Hyperlipidemia   ? Hypertension   ? Ingrown toenail 08/10/2017  ? Inguinal hernia   ? Iron deficiency anemia 03/13/2021  ? Migraine headache   ? Onychomycosis 12/08/2017  ? Paresthesia of arm 03/01/2015  ? Schatzki's ring   ? Tendonitis, Achilles, right 12/08/2017  ? Unilateral primary osteoarthritis, left knee 10/07/2017  ? ?Past Surgical History:  ?Procedure Laterality Date  ? ABDOMINAL HYSTERECTOMY    ? BACK SURGERY    ? x 3  ? CARPAL TUNNEL RELEASE    ? left wrist  ? CERVICAL SPINE SURGERY    ? HEMORRHOID SURGERY    ? ? ?Allergies ? ?Allergies  ?Allergen Reactions  ? Shellfish Allergy Hives and Swelling  ? Ace Inhibitors Other (See Comments)  ?  Pt cannot recall her reaction but tolerates arb   ? Gabapentin Other (See Comments)  ?  headaches  ? Pitavastatin   ?  Unknown reaction   ? Topiramate   ?  Heart race,   ? Sulfa Antibiotics Other (See Comments) and Rash  ?  Fine bumps  ? ? ?History of Present Illness  ?  ?[name] is a 76 y.o. female who presents via audio/video conferencing for a telehealth visit today.  Pt was last seen  in cardiology clinic on 12/20/2020 by Dr.Varanasi.  At that time AZADEH HYDER was doing well overall from a cardiac standpoint.  She reported stable persistent palpitations. She was cleared for hip surgery at the time.  She follows with the hypertension clinic and has an upcoming appointment scheduled with our hypertension clinic Pharm.D. on 07/10/2021. The patient is now pending procedure as outlined above. Since her last visit, she has been stable overall from a cardiac standpoint.  Her BP  has improved with home readings generally in the 130s/70s. She denies chest pain, worsening palpitations, dyspnea, pnd, orthopnea, n, v, dizziness, syncope, worsening edema, weight gain, or early satiety. She does complain of L hip pain and constipation.  Otherwise, from a cardiac standpoint, she reports feeling well denies any new concerns today. ? ?Home Medications  ?  ?Prior to Admission medications   ?Medication Sig Start Date End Date Taking? Authorizing Provider  ?acetaminophen (TYLENOL) 325 MG tablet Take 650 mg by mouth. For headaches    [provider]  ?alum & mag hydroxide-simeth (MAALOX MAX) 400-400-40 MG/5ML suspension Take 10 mLs by mouth every 6 (six) hours as needed for indigestion. 01/03/21   Long, Wonda Olds, MD  ?Calcium Carb-Cholecalciferol (CALCIUM 600 + D PO) Take 1 tablet by mouth daily.    [provider]  ?EPINEPHrine (EPIPEN 2-PAK) 0.3 mg/0.3 mL IJ SOAJ injection Inject 0.3 mLs (0.3 mg total) into the muscle as needed for anaphylaxis. Per allergen immunotherapy protocol 10/04/19   Ambs, Kathrine Cords, FNP  ?fluticasone (FLONASE) 50 MCG/ACT nasal spray Place 2 sprays into both nostrils daily as needed for allergies. 04/11/21   Copland, Gay Filler, MD  ?losartan (COZAAR) 100 MG tablet TAKE 1 TABLET BY MOUTH EVERY DAY 03/13/21   Jettie Booze, MD  ?METAMUCIL FIBER PO Take by mouth. ?Patient not taking: Reported on 06/28/2021    [provider]  ?Multiple Vitamins-Minerals (MULTIVITAL) tablet Take 1 tablet by mouth daily.     [provider]  ?nortriptyline (PAMELOR) 25 MG capsule Take 2 capsules (50 mg total) by mouth at bedtime. 03/21/21   Lomax, Amy, NP  ?omeprazole (PRILOSEC) 40 MG capsule Take 40 mg by mouth daily. 10/05/20   [provider]  ?potassium chloride SA (KLOR-CON M) 20 MEQ tablet TAKE 1 TABLET BY MOUTH EVERY DAY 01/24/21   Jettie Booze, MD  ?rosuvastatin (CRESTOR) 10 MG tablet TAKE 1 TABLET BY MOUTH EVERY DAY 01/17/21   Copland, Gay Filler, MD  ?spironolactone (ALDACTONE) 25 MG tablet Take 1 tablet (25 mg total) by mouth daily. 05/15/21   Jettie Booze, MD  ?tiZANidine (ZANAFLEX) 4 MG tablet Take 1 tablet (4 mg total) by mouth every 8 (eight) hours as needed for muscle spasms. 06/22/21   Mcarthur Rossetti, MD  ?traMADol Veatrice Bourbon) 50 MG tablet Take 1-2 tablets (50-100 mg total) by mouth every 6 (six) hours as needed. ?Patient not taking: Reported on 06/28/2021 01/30/21   Mcarthur Rossetti, MD  ? ? ?Physical Exam  ?  ?Vital Signs:  CALDONIA LEAP does not have vital signs available for review today. ? ?Given telephonic nature of communication, physical exam is limited. ?AAOx3. NAD. Normal affect.  Speech and respirations are unlabored. ? ?Accessory Clinical Findings  ?  ?None ? ?Assessment & Plan  ?  ?1.  Preoperative Cardiovascular Risk Assessment: ? ?According to the Revised Cardiac Risk Index (RCRI), her Perioperative Risk of Major Cardiac Event is (%): 0.9. Her Functional Capacity in METs is:  5.81 according to the Duke Activity Status Index (DASI). Therefore, based on ACC/AHA guidelines, patient would be at acceptable risk for the planned procedure without further cardiovascular testing.  ? ? ?A copy of this note will be routed to requesting surgeon. ? ?Time:   ?Today, I have spent 9 minutes with the patient with telehealth technology discussing medical history, symptoms, and management plan.   ? ? ?Lenna Sciara, NP ? ?07/02/2021, 2:58 PM ? ?

## 2021-07-03 ENCOUNTER — Encounter: Payer: Self-pay | Admitting: Hematology & Oncology

## 2021-07-05 ENCOUNTER — Ambulatory Visit (INDEPENDENT_AMBULATORY_CARE_PROVIDER_SITE_OTHER): Payer: Medicare Other

## 2021-07-05 ENCOUNTER — Ambulatory Visit (INDEPENDENT_AMBULATORY_CARE_PROVIDER_SITE_OTHER): Payer: Medicare Other | Admitting: Family Medicine

## 2021-07-05 VITALS — BP 122/66 | HR 97 | Temp 97.8°F | Resp 18 | Ht 65.0 in | Wt 217.0 lb

## 2021-07-05 DIAGNOSIS — J309 Allergic rhinitis, unspecified: Secondary | ICD-10-CM

## 2021-07-05 DIAGNOSIS — Z8719 Personal history of other diseases of the digestive system: Secondary | ICD-10-CM | POA: Diagnosis not present

## 2021-07-05 DIAGNOSIS — R140 Abdominal distension (gaseous): Secondary | ICD-10-CM | POA: Diagnosis not present

## 2021-07-05 NOTE — Progress Notes (Signed)
[facility] [address] [place], [place] [address] [phone]  Date:  07/05/2021   Name:  Stephanie Parks   DOB:  February 19, 1945   MRN:  [id number]  PCP:  [name], MD    Chief Complaint: Constipation (Pt says her sxs are min better. /Concerns/ questions: 1.dark places on her skin. 2. Swelling in the ankles./)   History of Present Illness:  Stephanie Parks is a 76 y.o. very pleasant female patient who presents with the following:  Here today for concern of constipation follow-up  She notes she had a loose stool yesterday- she tried taking 3 doses of miralax and this finally worked She did not take any MiraLAX today because she was concerned about leaving the house  Pt feels like she first noted constipation about 2 weeks ago We did an abd film on 5/3 ABDOMEN - 1 VIEW COMPARISON:  None Available.   FINDINGS: Lung bases are clear. Stool throughout the colon. Relative paucity of small bowel gas. Lumbar spine degenerative changes. Pelvic phleboliths. Left-greater-than-right hip joint degenerative changes. IMPRESSION: Stool throughout the colon as can be seen with constipation. Lumbar spine and bilateral hip joint degenerative changes.  No belly pain or vomiting  Pt notes she is under some stress, she has some pain from her hip which needs to be replaced.  She is not taking narcotics however  She is eating better again now that she did have a BM and feels less bloated No blood in her stools   She actually went to [facility] in February for constipation- Dr Jamey Reas.  They had her try some PT - she just went once, but she has an appt next week for another session Patient Active Problem List   Diagnosis Date Noted   Iron deficiency anemia 03/13/2021   Erythropoietin deficiency anemia 03/13/2021   Unilateral primary osteoarthritis, left hip 10/18/2020   Greater trochanteric bursitis of left hip 10/18/2020    Headache 06/07/2020   Acute respiratory failure with hypoxia ([place]) 05/02/2020   Post-COVID chronic dyspnea 05/02/2020   Pulsatile neck mass 04/26/2020   Pneumonia due to COVID-19 virus 03/12/2020   Headache disorder 01/04/2020   Seasonal allergic conjunctivitis 10/04/2019   Dysfunction of right eustachian tube 10/04/2019   Pre-diabetes 06/02/2019   Seasonal and perennial allergic rhinitis 12/22/2018   Heart palpitations 12/22/2018   History of chest pain 07/20/2018   Brain aneurysm 07/20/2018   Dyslipidemia 05/04/2018   Tendonitis, Achilles, right 12/08/2017   Unilateral primary osteoarthritis, left knee 10/07/2017   Ingrown toenail 08/10/2017   Coughing 04/24/2017   CAD (coronary artery disease) 03/01/2015   Aneurysm ([place]) 03/01/2015   Dizziness 03/01/2015   Paresthesia of arm 03/01/2015   Achalasia 09/28/2012   CN (constipation) 09/28/2012   Cephalalgia 08/24/2012   Essential hypertension 04/19/2011   Hyperlipidemia 04/19/2011   GERD (gastroesophageal reflux disease) 04/19/2011   Chest pain 04/04/2011    Past Medical History:  Diagnosis Date   3-vessel CAD 03/01/2015   Achalasia 09/28/2012   Allergic rhinitis 03/01/2015   Anemia    Aneurysm ([place]) 03/01/2015   BP (high blood pressure) 03/01/2015   Bronchitis 04/23/2018   Cephalalgia 08/24/2012   Overview:  ICD-10 cut over     Chest pain 04/04/2011   CN (constipation) 09/28/2012   Coughing 04/24/2017   Decreased potassium in the blood 03/01/2015   Diverticulosis    Dizziness 03/01/2015   Erythropoietin deficiency  anemia 03/13/2021   GERD (gastroesophageal reflux disease)    Hiatal hernia    Hypercholesterolemia 03/01/2015   Hyperlipidemia    Hypertension    Ingrown toenail 08/10/2017   Inguinal hernia    Iron deficiency anemia 03/13/2021   Migraine headache    Onychomycosis 12/08/2017   Paresthesia of arm 03/01/2015   Schatzki's ring    Tendonitis, Achilles, right 12/08/2017   Unilateral primary osteoarthritis, left knee  10/07/2017    Past Surgical History:  Procedure Laterality Date   ABDOMINAL HYSTERECTOMY     BACK SURGERY     x 3   CARPAL TUNNEL RELEASE     left wrist   CERVICAL SPINE SURGERY     HEMORRHOID SURGERY      Social History   Tobacco Use   Smoking status: Former    Packs/day: 0.50    Years: 20.00    Pack years: 10.00    Types: Cigarettes    Quit date: 02/19/1984    Years since quitting: 37.4   Smokeless tobacco: Never  Vaping Use   Vaping Use: Never used  Substance Use Topics   Alcohol use: No   Drug use: No    Family History  Problem Relation Age of Onset   Allergic rhinitis Sister    Diabetes Sister    Hypertension Sister    Heart attack Father 26   Heart disease Father    Stomach cancer Maternal Grandmother    Heart disease Mother    Colon cancer Neg Hx    Angioedema Neg Hx    Asthma Neg Hx    Eczema Neg Hx    Urticaria Neg Hx    Immunodeficiency Neg Hx    Breast cancer Neg Hx    Migraines Neg Hx    Headache Neg Hx     Allergies  Allergen Reactions   Shellfish Allergy Hives and Swelling   Ace Inhibitors Other (See Comments)    Pt cannot recall her reaction but tolerates arb    Gabapentin Other (See Comments)    headaches   Pitavastatin     Unknown reaction    Topiramate     Heart race,    Sulfa Antibiotics Other (See Comments) and Rash    Fine bumps    Medication list has been reviewed and updated.  Current Outpatient Medications on File Prior to Visit  Medication Sig Dispense Refill   acetaminophen (TYLENOL) 325 MG tablet Take 650 mg by mouth. For headaches     alum & mag hydroxide-simeth (MAALOX MAX) 400-400-40 MG/5ML suspension Take 10 mLs by mouth every 6 (six) hours as needed for indigestion. 355 mL 0   Calcium Carb-Cholecalciferol (CALCIUM 600 + D PO) Take 1 tablet by mouth daily.     EPINEPHrine (EPIPEN 2-PAK) 0.3 mg/0.3 mL IJ SOAJ injection Inject 0.3 mLs (0.3 mg total) into the muscle as needed for anaphylaxis. Per allergen immunotherapy  protocol 1 each 2   fluticasone (FLONASE) 50 MCG/ACT nasal spray Place 2 sprays into both nostrils daily as needed for allergies. 48 g 3   losartan (COZAAR) 100 MG tablet TAKE 1 TABLET BY MOUTH EVERY DAY 90 tablet 3   Multiple Vitamins-Minerals (MULTIVITAL) tablet Take 1 tablet by mouth daily.      nortriptyline (PAMELOR) 25 MG capsule Take 2 capsules (50 mg total) by mouth at bedtime. 180 capsule 3   omeprazole (PRILOSEC) 40 MG capsule Take 40 mg by mouth daily.     potassium chloride SA (KLOR-CON M)  20 MEQ tablet TAKE 1 TABLET BY MOUTH EVERY DAY 180 tablet 1   rosuvastatin (CRESTOR) 10 MG tablet TAKE 1 TABLET BY MOUTH EVERY DAY 90 tablet 3   spironolactone (ALDACTONE) 25 MG tablet Take 1 tablet (25 mg total) by mouth daily. 90 tablet 3   tiZANidine (ZANAFLEX) 4 MG tablet Take 1 tablet (4 mg total) by mouth every 8 (eight) hours as needed for muscle spasms. 30 tablet 0   No current facility-administered medications on file prior to visit.    Review of Systems:  As per HPI- otherwise negative.   Physical Examination: Vitals:   07/05/21 1501  BP: 122/66  Pulse: 97  Resp: 18  Temp: 97.8 F (36.6 C)  SpO2: 97%   Vitals:   07/05/21 1501  Weight: 217 lb (98.4 kg)  Height: '5\' 5"'$  (1.651 m)   Body mass index is 36.11 kg/m. Ideal Body Weight: Weight in (lb) to have BMI = 25: 149.9  GEN: no acute distress.  Obese, looks well HEENT: Atraumatic, Normocephalic.  Ears and Nose: No external deformity. CV: RRR, No M/G/R. No JVD. No thrill. No extra heart sounds. PULM: CTA B, no wheezes, crackles, rhonchi. No retractions. No resp. distress. No accessory muscle use. ABD: S, NT, ND, +BS. No rebound. No HSM.  Belly is soft and benign EXTR: No c/c/e PSYCH: Normally interactive. Conversant.    Assessment and Plan: History of constipation  Abdominal bloating  Patient seen today with constipation and bloating for 2 to 3 weeks.  We have her using some increased dose of MiraLAX and this  does seem to be working.  She is feeling improved.  She actually has GI specialty care through [facility].  I asked her to follow-up with them if she does not continue to see improvement She also has physical therapy through GI pending next week  Signed Lamar Blinks, MD

## 2021-07-10 ENCOUNTER — Ambulatory Visit (INDEPENDENT_AMBULATORY_CARE_PROVIDER_SITE_OTHER): Payer: Medicare Other | Admitting: Pharmacist

## 2021-07-10 VITALS — BP 128/60 | HR 104

## 2021-07-10 DIAGNOSIS — I10 Essential (primary) hypertension: Secondary | ICD-10-CM

## 2021-07-10 NOTE — Patient Instructions (Addendum)
Please continue spironolactone '25mg'$  daily, losartan '100mg'$  daily  Call me with any questions at [phone]

## 2021-07-10 NOTE — Progress Notes (Signed)
Patient ID: [name]                 DOB: 12/07/1945                      MRN: [id number]     HPI: Stephanie Parks is a 76 y.o. female referred by Dr. Irish Parks to HTN clinic. PMH is significant for HTN, HLD, brain aneurysm, palpitations, frequent trips to urgent care ER for chest pain, cath with no PCI needed and knee arthritis needing knee replacement. Patient saw Dr. Irish Parks on 11/2. Her metoprolol was stopped and she was started on amlodipine. Per Dr. Irish Parks note, patient has LLE at baseline. Patient was seen in the ED on 11/13 for chest pain, complained of swelling of her legs and her amlodipine was stopped. She was started on carvedilol. She was seen again in the ED on 11/16 for chest pain/burning. Thought to be from GERD. Advised to use Mallox. Patient seen initially in Nov 2022 by pharmD. Had swelling in her foot that she thought was from carvedilol. BP was well controlled and she decided to stay on same therapy. She called office in Dec stating she wanted to get off of carvedilol. She was switched to spironolactone 12.'5mg'$  daily.  She saw Dr. Larose Parks on 12/20 for swelling/HTN. Spironolactone was stopped and carvedilol was resumed. Edecrin was started due to sulfa allergy. Still with SOB. PE ruled out, found to have very mild iron deficiency anemia which Dr. Marin Parks did not feel was the cause of her symptoms.   Patient seen in clinic 2/14. She was not taking her carvedilol twice a day. I asked her to take it every 12 hours. Patient called office on 3/28 insisting that carvedilol was causing her to swell and she wanted to stop taking it. She was agreeable to re-trying spironolactone '25mg'$  daily. Patient called office on 4/12 reporting BP of 123/68 and 114/57 with HR ~100. Her follow up lab work was stable and HR came down. Last seen by me on 06/15/21. Her BP in clinic was 128/60. Reported home BP was 160/74. I asked her to bring her home cuff with her to her next appointment. We discussed  breathing techniques.  Patient presents today to HTN clinic. She brings in her home cuff and log book. She denies dizziness, lightheadedness, headache, blurred vision, SOB, swelling. Is using breathing techniques to help calm her down when she feels like her heart is racing.   Home BP cuff 135/70 HR 109 Home BP cuff 131/71 HR 108 Clinic cuff: 128/64 Clinic cuff: 124/60 Home BC cuff: 142/74  Current HTN meds: spironolactone '25mg'$  daily, losartan '100mg'$  daily  Previously tried: spironolactone, irbesartan, amlodipine BP goal: <130/80  Family History: The patient's family history includes Allergic rhinitis in her sister; Diabetes in her sister; Heart attack (age of onset: 10) in her father; Heart disease in her father and mother; Hypertension in her sister; Stomach cancer in her maternal grandmother.   Social History: The patient reports that she quit smoking about 36 years ago. Her smoking use included cigarettes. She has a 10.00 pack-year smoking history. She has never used smokeless tobacco. She reports that she does not drink alcohol and does not use drugs.   Diet: coffee once or twice a week, tea very seldom, gingerale, chocolate  Exercise: doing left lifts, walking doesn't bother her hip as much  Home BP readings: 129/66, 140/75, 132/67, 135/72, 143/72, 136/70, 138/72, 110/50, 147/70, 113/65, 134/72, 152/72, 147/80, 137/72,  132/64, 122/66, 143/62, 123/59,  HR 80-90 Wt Readings from Last 3 Encounters:  07/05/21 217 lb (98.4 kg)  06/21/21 219 lb 6.4 oz (99.5 kg)  06/20/21 219 lb 6.4 oz (99.5 kg)   BP Readings from Last 3 Encounters:  07/05/21 122/66  06/20/21 133/64  06/15/21 128/60   Pulse Readings from Last 3 Encounters:  07/05/21 97  06/20/21 82  06/15/21 (!) 108    Renal function: Estimated Creatinine Clearance: 70.6 mL/min (by C-G formula based on SCr of 0.76 mg/dL).  Past Medical History:  Diagnosis Date   3-vessel CAD 03/01/2015   Achalasia 09/28/2012   Allergic  rhinitis 03/01/2015   Anemia    Aneurysm ([place]) 03/01/2015   BP (high blood pressure) 03/01/2015   Bronchitis 04/23/2018   Cephalalgia 08/24/2012   Overview:  ICD-10 cut over     Chest pain 04/04/2011   CN (constipation) 09/28/2012   Coughing 04/24/2017   Decreased potassium in the blood 03/01/2015   Diverticulosis    Dizziness 03/01/2015   Erythropoietin deficiency anemia 03/13/2021   GERD (gastroesophageal reflux disease)    Hiatal hernia    Hypercholesterolemia 03/01/2015   Hyperlipidemia    Hypertension    Ingrown toenail 08/10/2017   Inguinal hernia    Iron deficiency anemia 03/13/2021   Migraine headache    Onychomycosis 12/08/2017   Paresthesia of arm 03/01/2015   Schatzki's ring    Tendonitis, Achilles, right 12/08/2017   Unilateral primary osteoarthritis, left knee 10/07/2017    Current Outpatient Medications on File Prior to Visit  Medication Sig Dispense Refill   acetaminophen (TYLENOL) 325 MG tablet Take 650 mg by mouth. For headaches     alum & mag hydroxide-simeth (MAALOX MAX) 400-400-40 MG/5ML suspension Take 10 mLs by mouth every 6 (six) hours as needed for indigestion. 355 mL 0   Calcium Carb-Cholecalciferol (CALCIUM 600 + D PO) Take 1 tablet by mouth daily.     EPINEPHrine (EPIPEN 2-PAK) 0.3 mg/0.3 mL IJ SOAJ injection Inject 0.3 mLs (0.3 mg total) into the muscle as needed for anaphylaxis. Per allergen immunotherapy protocol 1 each 2   fluticasone (FLONASE) 50 MCG/ACT nasal spray Place 2 sprays into both nostrils daily as needed for allergies. 48 g 3   losartan (COZAAR) 100 MG tablet TAKE 1 TABLET BY MOUTH EVERY DAY 90 tablet 3   Multiple Vitamins-Minerals (MULTIVITAL) tablet Take 1 tablet by mouth daily.      nortriptyline (PAMELOR) 25 MG capsule Take 2 capsules (50 mg total) by mouth at bedtime. 180 capsule 3   omeprazole (PRILOSEC) 40 MG capsule Take 40 mg by mouth daily.     potassium chloride SA (KLOR-CON M) 20 MEQ tablet TAKE 1 TABLET BY MOUTH EVERY DAY 180 tablet 1    rosuvastatin (CRESTOR) 10 MG tablet TAKE 1 TABLET BY MOUTH EVERY DAY 90 tablet 3   spironolactone (ALDACTONE) 25 MG tablet Take 1 tablet (25 mg total) by mouth daily. 90 tablet 3   tiZANidine (ZANAFLEX) 4 MG tablet Take 1 tablet (4 mg total) by mouth every 8 (eight) hours as needed for muscle spasms. 30 tablet 0   No current facility-administered medications on file prior to visit.    Allergies  Allergen Reactions   Shellfish Allergy Hives and Swelling   Ace Inhibitors Other (See Comments)    Pt cannot recall her reaction but tolerates arb    Gabapentin Other (See Comments)    headaches   Pitavastatin     Unknown reaction  Topiramate     Heart race,    Sulfa Antibiotics Other (See Comments) and Rash    Fine bumps    There were no vitals taken for this visit.   Assessment/Plan:  1. Hypertension - BP is at goal of <130/80 today in clinic. HR is elevated. Patient declines medication for HR at this time. Continue spironolactone '25mg'$  daily and losartan '100mg'$  daily. Follow up as needed.  Thank you  Ramond Dial, Pharm.D, BCPS, CPP [facility]  [address] [place], [place] [address]  Phone: [phone]; Fax: [phone]

## 2021-07-11 DIAGNOSIS — M6281 Muscle weakness (generalized): Secondary | ICD-10-CM | POA: Diagnosis not present

## 2021-07-11 DIAGNOSIS — R198 Other specified symptoms and signs involving the digestive system and abdomen: Secondary | ICD-10-CM | POA: Diagnosis not present

## 2021-07-11 DIAGNOSIS — K5900 Constipation, unspecified: Secondary | ICD-10-CM | POA: Diagnosis not present

## 2021-07-11 DIAGNOSIS — R278 Other lack of coordination: Secondary | ICD-10-CM | POA: Diagnosis not present

## 2021-07-16 NOTE — Progress Notes (Unsigned)
[place] at [facility] [address], [place], [place] [address] [phone]  Date:  07/18/2021   Name:  Stephanie Parks   DOB:  08-27-1945   MRN:  [id number]  PCP:  [name], MD    Chief Complaint: No chief complaint on file.   History of Present Illness:  Stephanie Parks is a 76 y.o. very pleasant female patient who presents with the following:  Patient seen today with concern of groin pain History of hypertension, hyperlipidemia, allergic rhinitis, OSA on CPAP. She has tended to have frequent ER visits for chest pain likely related to anxiety She also had IOMBT-59 complicated by pulmonary embolism -She has completed treatment with Eliquis and is no longer using oxygen Last seen by myself May 18 to follow-up constipation  Colon cancer screening Shingles vaccine  Patient Active Problem List   Diagnosis Date Noted   Iron deficiency anemia 03/13/2021   Erythropoietin deficiency anemia 03/13/2021   Unilateral primary osteoarthritis, left hip 10/18/2020   Greater trochanteric bursitis of left hip 10/18/2020   Headache 06/07/2020   Acute respiratory failure with hypoxia ([place]) 05/02/2020   Post-COVID chronic dyspnea 05/02/2020   Pulsatile neck mass 04/26/2020   Pneumonia due to COVID-19 virus 03/12/2020   Headache disorder 01/04/2020   Seasonal allergic conjunctivitis 10/04/2019   Dysfunction of right eustachian tube 10/04/2019   Pre-diabetes 06/02/2019   Seasonal and perennial allergic rhinitis 12/22/2018   Heart palpitations 12/22/2018   History of chest pain 07/20/2018   Brain aneurysm 07/20/2018   Dyslipidemia 05/04/2018   Tendonitis, Achilles, right 12/08/2017   Unilateral primary osteoarthritis, left knee 10/07/2017   Ingrown toenail 08/10/2017   Coughing 04/24/2017   CAD (coronary artery disease) 03/01/2015   Aneurysm ([place]) 03/01/2015   Dizziness 03/01/2015   Paresthesia of arm 03/01/2015   Achalasia  09/28/2012   CN (constipation) 09/28/2012   Cephalalgia 08/24/2012   Essential hypertension 04/19/2011   Hyperlipidemia 04/19/2011   GERD (gastroesophageal reflux disease) 04/19/2011   Chest pain 04/04/2011    Past Medical History:  Diagnosis Date   3-vessel CAD 03/01/2015   Achalasia 09/28/2012   Allergic rhinitis 03/01/2015   Anemia    Aneurysm ([place]) 03/01/2015   BP (high blood pressure) 03/01/2015   Bronchitis 04/23/2018   Cephalalgia 08/24/2012   Overview:  ICD-10 cut over     Chest pain 04/04/2011   CN (constipation) 09/28/2012   Coughing 04/24/2017   Decreased potassium in the blood 03/01/2015   Diverticulosis    Dizziness 03/01/2015   Erythropoietin deficiency anemia 03/13/2021   GERD (gastroesophageal reflux disease)    Hiatal hernia    Hypercholesterolemia 03/01/2015   Hyperlipidemia    Hypertension    Ingrown toenail 08/10/2017   Inguinal hernia    Iron deficiency anemia 03/13/2021   Migraine headache    Onychomycosis 12/08/2017   Paresthesia of arm 03/01/2015   Schatzki's ring    Tendonitis, Achilles, right 12/08/2017   Unilateral primary osteoarthritis, left knee 10/07/2017    Past Surgical History:  Procedure Laterality Date   ABDOMINAL HYSTERECTOMY     BACK SURGERY     x 3   CARPAL TUNNEL RELEASE     left wrist   CERVICAL SPINE SURGERY     HEMORRHOID SURGERY      Social History   Tobacco Use   Smoking status: Former    Packs/day: 0.50    Years: 20.00    Pack years:  10.00    Types: Cigarettes    Quit date: 02/19/1984    Years since quitting: 37.4   Smokeless tobacco: Never  Vaping Use   Vaping Use: Never used  Substance Use Topics   Alcohol use: No   Drug use: No    Family History  Problem Relation Age of Onset   Allergic rhinitis Sister    Diabetes Sister    Hypertension Sister    Heart attack Father 73   Heart disease Father    Stomach cancer Maternal Grandmother    Heart disease Mother    Colon cancer Neg Hx    Angioedema Neg Hx    Asthma  Neg Hx    Eczema Neg Hx    Urticaria Neg Hx    Immunodeficiency Neg Hx    Breast cancer Neg Hx    Migraines Neg Hx    Headache Neg Hx     Allergies  Allergen Reactions   Shellfish Allergy Hives and Swelling   Ace Inhibitors Other (See Comments)    Pt cannot recall her reaction but tolerates arb    Gabapentin Other (See Comments)    headaches   Pitavastatin     Unknown reaction    Topiramate     Heart race,    Sulfa Antibiotics Other (See Comments) and Rash    Fine bumps    Medication list has been reviewed and updated.  Current Outpatient Medications on File Prior to Visit  Medication Sig Dispense Refill   acetaminophen (TYLENOL) 325 MG tablet Take 650 mg by mouth. For headaches     alum & mag hydroxide-simeth (MAALOX MAX) 400-400-40 MG/5ML suspension Take 10 mLs by mouth every 6 (six) hours as needed for indigestion. 355 mL 0   Calcium Carb-Cholecalciferol (CALCIUM 600 + D PO) Take 1 tablet by mouth daily.     EPINEPHrine (EPIPEN 2-PAK) 0.3 mg/0.3 mL IJ SOAJ injection Inject 0.3 mLs (0.3 mg total) into the muscle as needed for anaphylaxis. Per allergen immunotherapy protocol 1 each 2   fluticasone (FLONASE) 50 MCG/ACT nasal spray Place 2 sprays into both nostrils daily as needed for allergies. 48 g 3   losartan (COZAAR) 100 MG tablet TAKE 1 TABLET BY MOUTH EVERY DAY 90 tablet 3   Multiple Vitamins-Minerals (MULTIVITAL) tablet Take 1 tablet by mouth daily.      nortriptyline (PAMELOR) 25 MG capsule Take 2 capsules (50 mg total) by mouth at bedtime. 180 capsule 3   omeprazole (PRILOSEC) 40 MG capsule Take 40 mg by mouth daily.     potassium chloride SA (KLOR-CON M) 20 MEQ tablet TAKE 1 TABLET BY MOUTH EVERY DAY 180 tablet 1   rosuvastatin (CRESTOR) 10 MG tablet TAKE 1 TABLET BY MOUTH EVERY DAY 90 tablet 3   spironolactone (ALDACTONE) 25 MG tablet Take 1 tablet (25 mg total) by mouth daily. 90 tablet 3   tiZANidine (ZANAFLEX) 4 MG tablet Take 1 tablet (4 mg total) by mouth every  8 (eight) hours as needed for muscle spasms. 30 tablet 0   No current facility-administered medications on file prior to visit.    Review of Systems:  As per HPI- otherwise negative.   Physical Examination: There were no vitals filed for this visit. There were no vitals filed for this visit. There is no height or weight on file to calculate BMI. Ideal Body Weight:    GEN: no acute distress. HEENT: Atraumatic, Normocephalic.  Ears and Nose: No external deformity. CV: RRR, No M/G/R. No  JVD. No thrill. No extra heart sounds. PULM: CTA B, no wheezes, crackles, rhonchi. No retractions. No resp. distress. No accessory muscle use. ABD: S, NT, ND, +BS. No rebound. No HSM. EXTR: No c/c/e PSYCH: Normally interactive. Conversant.    Assessment and Plan: ***  Signed Lamar Blinks, MD

## 2021-07-18 ENCOUNTER — Other Ambulatory Visit: Payer: Self-pay

## 2021-07-18 ENCOUNTER — Ambulatory Visit (INDEPENDENT_AMBULATORY_CARE_PROVIDER_SITE_OTHER): Payer: Medicare Other | Admitting: Family Medicine

## 2021-07-18 ENCOUNTER — Encounter: Payer: Self-pay | Admitting: Family Medicine

## 2021-07-18 VITALS — BP 122/62 | HR 100 | Temp 98.1°F | Resp 18 | Ht 65.0 in | Wt 216.4 lb

## 2021-07-18 DIAGNOSIS — R208 Other disturbances of skin sensation: Secondary | ICD-10-CM | POA: Diagnosis not present

## 2021-07-18 DIAGNOSIS — K591 Functional diarrhea: Secondary | ICD-10-CM | POA: Diagnosis not present

## 2021-07-18 MED ORDER — DICLOFENAC SODIUM 1 % EX GEL: CUTANEOUS | 3 refills | Status: DC

## 2021-07-18 NOTE — Patient Instructions (Signed)
It was good to see you today- try the voltaren gel as needed for the burning on your skin Please let me know if not improving with this pain or with your diarrhea in the next week or so- if this persists we can do a CT scan

## 2021-07-24 DIAGNOSIS — H35361 Drusen (degenerative) of macula, right eye: Secondary | ICD-10-CM | POA: Diagnosis not present

## 2021-07-24 DIAGNOSIS — H31003 Unspecified chorioretinal scars, bilateral: Secondary | ICD-10-CM | POA: Diagnosis not present

## 2021-07-24 DIAGNOSIS — H02835 Dermatochalasis of left lower eyelid: Secondary | ICD-10-CM | POA: Diagnosis not present

## 2021-07-24 DIAGNOSIS — H0288B Meibomian gland dysfunction left eye, upper and lower eyelids: Secondary | ICD-10-CM | POA: Diagnosis not present

## 2021-07-24 DIAGNOSIS — H0288A Meibomian gland dysfunction right eye, upper and lower eyelids: Secondary | ICD-10-CM | POA: Diagnosis not present

## 2021-07-24 DIAGNOSIS — H02832 Dermatochalasis of right lower eyelid: Secondary | ICD-10-CM | POA: Diagnosis not present

## 2021-07-24 DIAGNOSIS — H02834 Dermatochalasis of left upper eyelid: Secondary | ICD-10-CM | POA: Diagnosis not present

## 2021-07-24 DIAGNOSIS — H02831 Dermatochalasis of right upper eyelid: Secondary | ICD-10-CM | POA: Diagnosis not present

## 2021-07-24 DIAGNOSIS — H47291 Other optic atrophy, right eye: Secondary | ICD-10-CM | POA: Diagnosis not present

## 2021-07-24 DIAGNOSIS — H35363 Drusen (degenerative) of macula, bilateral: Secondary | ICD-10-CM | POA: Diagnosis not present

## 2021-07-24 DIAGNOSIS — H25813 Combined forms of age-related cataract, bilateral: Secondary | ICD-10-CM | POA: Diagnosis not present

## 2021-07-31 ENCOUNTER — Emergency Department (HOSPITAL_BASED_OUTPATIENT_CLINIC_OR_DEPARTMENT_OTHER): Payer: Medicare Other

## 2021-07-31 ENCOUNTER — Other Ambulatory Visit: Payer: Self-pay

## 2021-07-31 ENCOUNTER — Encounter (HOSPITAL_BASED_OUTPATIENT_CLINIC_OR_DEPARTMENT_OTHER): Payer: Self-pay

## 2021-07-31 ENCOUNTER — Emergency Department (HOSPITAL_BASED_OUTPATIENT_CLINIC_OR_DEPARTMENT_OTHER)
Admission: EM | Admit: 2021-07-31 | Discharge: 2021-07-31 | Disposition: A | Discharge status: Home / Self Care | Payer: Medicare Other | Attending: Emergency Medicine | Admitting: Emergency Medicine

## 2021-07-31 DIAGNOSIS — I2510 Atherosclerotic heart disease of native coronary artery without angina pectoris: Secondary | ICD-10-CM | POA: Diagnosis not present

## 2021-07-31 DIAGNOSIS — K5900 Constipation, unspecified: Secondary | ICD-10-CM | POA: Diagnosis not present

## 2021-07-31 DIAGNOSIS — Z7951 Long term (current) use of inhaled steroids: Secondary | ICD-10-CM | POA: Diagnosis not present

## 2021-07-31 DIAGNOSIS — Z79899 Other long term (current) drug therapy: Secondary | ICD-10-CM | POA: Diagnosis not present

## 2021-07-31 DIAGNOSIS — I10 Essential (primary) hypertension: Secondary | ICD-10-CM | POA: Diagnosis not present

## 2021-07-31 DIAGNOSIS — R109 Unspecified abdominal pain: Secondary | ICD-10-CM | POA: Diagnosis not present

## 2021-07-31 DIAGNOSIS — R1031 Right lower quadrant pain: Secondary | ICD-10-CM | POA: Diagnosis present

## 2021-07-31 DIAGNOSIS — I700 Atherosclerosis of aorta: Secondary | ICD-10-CM | POA: Diagnosis not present

## 2021-07-31 LAB — URINALYSIS, MICROSCOPIC (REFLEX)
Bacteria, UA: NONE SEEN
RBC / HPF: NONE SEEN RBC/hpf (ref 0–5)

## 2021-07-31 LAB — COMPREHENSIVE METABOLIC PANEL
ALT: 14 U/L (ref 0–44)
AST: 20 U/L (ref 15–41)
Albumin: 3.8 g/dL (ref 3.5–5.0)
Alkaline Phosphatase: 79 U/L (ref 38–126)
Anion gap: 7 (ref 5–15)
BUN: 15 mg/dL (ref 8–23)
CO2: 29 mmol/L (ref 22–32)
Calcium: 9.5 mg/dL (ref 8.9–10.3)
Chloride: 103 mmol/L (ref 98–111)
Creatinine, Ser: 0.97 mg/dL (ref 0.44–1.00)
GFR, Estimated: >60 mL/min (ref >60)
Glucose, Bld: 92 mg/dL (ref 70–99)
Potassium: 4.2 mmol/L (ref 3.5–5.1)
Sodium: 139 mmol/L (ref 135–145)
Total Bilirubin: 0.4 mg/dL (ref 0.3–1.2)
Total Protein: 7.2 g/dL (ref 6.5–8.1)

## 2021-07-31 LAB — CBC
HCT: 35.8 % — ABNORMAL LOW (ref 36.0–46.0)
Hemoglobin: 11.9 g/dL — ABNORMAL LOW (ref 12.0–15.0)
MCH: 30.5 pg (ref 26.0–34.0)
MCHC: 33.2 g/dL (ref 30.0–36.0)
MCV: 91.8 fL (ref 80.0–100.0)
Platelets: 281 10*3/uL (ref 150–400)
RBC: 3.9 MIL/uL (ref 3.87–5.11)
RDW: 14.1 % (ref 11.5–15.5)
WBC: 8.3 10*3/uL (ref 4.0–10.5)
nRBC: 0 % (ref 0.0–0.2)

## 2021-07-31 LAB — URINALYSIS, ROUTINE W REFLEX MICROSCOPIC
Bilirubin Urine: NEGATIVE
Glucose, UA: NEGATIVE mg/dL
Hgb urine dipstick: NEGATIVE
Ketones, ur: NEGATIVE mg/dL
Nitrite: NEGATIVE
Protein, ur: NEGATIVE mg/dL
Specific Gravity, Urine: 1.01 (ref 1.005–1.030)
pH: 6.5 (ref 5.0–8.0)

## 2021-07-31 LAB — LIPASE, BLOOD: Lipase: 32 U/L (ref 11–51)

## 2021-07-31 MED ORDER — ACETAMINOPHEN 325 MG PO TABS
650.0000 mg | ORAL_TABLET | Freq: Once | ORAL | Status: AC
Start: 2021-07-31 — End: 2021-07-31
  Administered 2021-07-31: 650 mg via ORAL
  Filled 2021-07-31: qty 2

## 2021-07-31 MED ORDER — IOHEXOL 300 MG/ML  SOLN
100.0000 mL | Freq: Once | INTRAMUSCULAR | Status: AC | PRN
  Administered 2021-07-31: 100 mL via INTRAVENOUS

## 2021-07-31 MED ORDER — SODIUM CHLORIDE 0.9 % IV BOLUS
1000.0000 mL | Freq: Once | INTRAVENOUS | Status: AC
Start: 2021-07-31 — End: 2021-07-31
  Administered 2021-07-31: 1000 mL via INTRAVENOUS

## 2021-07-31 NOTE — ED Provider Notes (Signed)
[facility] EMERGENCY DEPARTMENT Provider Note   CSN: [id number] Arrival date & time: 07/31/21  1411     History  Chief Complaint  Patient presents with   Abdominal Pain    Stephanie Parks is a 76 y.o. female.  Patient here with right lower abdominal pain for the last week.  Nothing makes it worse or better.  She has been trying to take Tylenol without much improvement.  Denies any pain with urination.  Denies history of kidney stone.  She has had a hysterectomy in the past but no other abdominal surgeries to her knowledge.  She has been constipated.  She has tried MiraLAX.  History of hypertension, high cholesterol, CAD.  Denies any pain with urination, hematuria, chest pain, shortness of breath.  The history is provided by the patient.       Home Medications Prior to Admission medications   Medication Sig Start Date End Date Taking? Authorizing Provider  acetaminophen (TYLENOL) 325 MG tablet Take 650 mg by mouth. For headaches    [provider]  alum & mag hydroxide-simeth (MAALOX MAX) 400-400-40 MG/5ML suspension Take 10 mLs by mouth every 6 (six) hours as needed for indigestion. 01/03/21   Long, Wonda Olds, MD  Calcium Carb-Cholecalciferol (CALCIUM 600 + D PO) Take 1 tablet by mouth daily.    [provider]  diclofenac Sodium (VOLTAREN ARTHRITIS PAIN) 1 % GEL Apply a small amount to skin 3-4x a day as needed 07/18/21   Copland, Gay Filler, MD  EPINEPHrine (EPIPEN 2-PAK) 0.3 mg/0.3 mL IJ SOAJ injection Inject 0.3 mLs (0.3 mg total) into the muscle as needed for anaphylaxis. Per allergen immunotherapy protocol 10/04/19   Ambs, Kathrine Cords, FNP  fluticasone [facility]) 50 MCG/ACT nasal spray Place 2 sprays into both nostrils daily as needed for allergies. 04/11/21   Copland, Gay Filler, MD  losartan (COZAAR) 100 MG tablet TAKE 1 TABLET BY MOUTH EVERY DAY 03/13/21   Jettie Booze, MD  Multiple Vitamins-Minerals (MULTIVITAL) tablet Take 1 tablet by mouth daily.      [provider]  nortriptyline (PAMELOR) 25 MG capsule Take 2 capsules (50 mg total) by mouth at bedtime. 03/21/21   Lomax, Amy, NP  omeprazole (PRILOSEC) 40 MG capsule Take 40 mg by mouth daily. 10/05/20   [provider]  potassium chloride SA (KLOR-CON M) 20 MEQ tablet TAKE 1 TABLET BY MOUTH EVERY DAY 01/24/21   Jettie Booze, MD  rosuvastatin (CRESTOR) 10 MG tablet TAKE 1 TABLET BY MOUTH EVERY DAY 01/17/21   Copland, Gay Filler, MD  spironolactone (ALDACTONE) 25 MG tablet Take 1 tablet (25 mg total) by mouth daily. 05/15/21   Jettie Booze, MD  tiZANidine (ZANAFLEX) 4 MG tablet Take 1 tablet (4 mg total) by mouth every 8 (eight) hours as needed for muscle spasms. 06/22/21   Mcarthur Rossetti, MD      Allergies    Shellfish allergy, Ace inhibitors, Gabapentin, Pitavastatin, Topiramate, and Sulfa antibiotics    Review of Systems   Review of Systems  Physical Exam Updated Vital Signs BP 135/68   Pulse 77   Temp 98.4 F (36.9 C) (Oral)   Resp 16   Ht '5\' 5"'$  (1.651 m)   Wt 98.4 kg   SpO2 99%   BMI 36.11 kg/m  Physical Exam Vitals and nursing note reviewed.  Constitutional:      General: She is not in acute distress.    Appearance: She is well-developed. She is not  ill-appearing.  HENT:     Head: Normocephalic and atraumatic.     Mouth/Throat:     Mouth: Mucous membranes are moist.  Eyes:     Extraocular Movements: Extraocular movements intact.     Conjunctiva/sclera: Conjunctivae normal.     Pupils: Pupils are equal, round, and reactive to light.  Cardiovascular:     Rate and Rhythm: Normal rate and regular rhythm.     Heart sounds: Normal heart sounds. No murmur heard. Pulmonary:     Effort: Pulmonary effort is normal. No respiratory distress.     Breath sounds: Normal breath sounds.  Abdominal:     General: Abdomen is flat.     Palpations: Abdomen is soft.     Tenderness: There is abdominal tenderness in the right lower quadrant.   Musculoskeletal:        General: No swelling.     Cervical back: Neck supple.  Skin:    General: Skin is warm and dry.     Capillary Refill: Capillary refill takes less than 2 seconds.  Neurological:     Mental Status: She is alert.  Psychiatric:        Mood and Affect: Mood normal.     ED Results / Procedures / Treatments   Labs (all labs ordered are listed, but only abnormal results are displayed) Labs Reviewed  CBC - Abnormal; Notable for the following components:      Result Value   Hemoglobin 11.9 (*)    HCT 35.8 (*)    All other components within normal limits  URINALYSIS, ROUTINE W REFLEX MICROSCOPIC - Abnormal; Notable for the following components:   Leukocytes,Ua TRACE (*)    All other components within normal limits  LIPASE, BLOOD  COMPREHENSIVE METABOLIC PANEL  URINALYSIS, MICROSCOPIC (REFLEX)    EKG None  Radiology CT ABDOMEN PELVIS W CONTRAST  Result Date: 07/31/2021 CLINICAL DATA:  Acute abdominal pain. EXAM: CT ABDOMEN AND PELVIS WITH CONTRAST TECHNIQUE: Multidetector CT imaging of the abdomen and pelvis was performed using the standard protocol following bolus administration of intravenous contrast. RADIATION DOSE REDUCTION: This exam was performed according to the departmental dose-optimization program which includes automated exposure control, adjustment of the mA and/or kV according to patient size and/or use of iterative reconstruction technique. CONTRAST:  174m OMNIPAQUE IOHEXOL 300 MG/ML  SOLN COMPARISON:  CT abdomen and pelvis 11/24/2019. FINDINGS: Lower chest: No acute abnormality. There is a calcified granuloma in the left lower lobe. Hepatobiliary: There is a stable rounded hypodensity in the left lobe of the liver which is too small to characterize, but likely a cyst. The gallbladder and bile ducts are within normal limits. Pancreas: Unremarkable. No pancreatic ductal dilatation or surrounding inflammatory changes. Spleen: Normal in size without focal  abnormality. Adrenals/Urinary Tract: Adrenal glands are unremarkable. Kidneys are normal, without renal calculi, focal lesion, or hydronephrosis. Bladder is unremarkable. Stomach/Bowel: Appendix appears normal. No evidence of bowel wall thickening, distention, or inflammatory changes. There is colonic diverticulosis without evidence for acute diverticulitis. There is a large amount of stool throughout the colon. Small hiatal hernia is present. The stomach is otherwise within normal limits. Small bowel loops appear within normal limits. Vascular/Lymphatic: Aortic atherosclerosis. No enlarged abdominal or pelvic lymph nodes. Reproductive: Status post hysterectomy. No adnexal masses. Other: Small fat containing left inguinal hernia. No ascites or free air. Musculoskeletal: Degenerative changes affect the spine. There are severe degenerative changes of the left hip. IMPRESSION: 1. No acute localizing process in the abdomen or pelvis. 2.  Colonic diverticulosis. 3. Large stool burden. 4. Fat containing left inguinal hernia. 5. Small hiatal hernia. 6.  Aortic Atherosclerosis (ICD10-I70.0). Electronically Signed   By: Ronney Asters M.D.   On: 07/31/2021 17:57    Procedures Procedures    Medications Ordered in ED Medications  acetaminophen (TYLENOL) tablet 650 mg (650 mg Oral Given 07/31/21 1703)  sodium chloride 0.9 % bolus 1,000 mL (1,000 mLs Intravenous New Bag/Given 07/31/21 1704)  iohexol (OMNIPAQUE) 300 MG/ML solution 100 mL (100 mLs Intravenous Contrast Given 07/31/21 1750)    ED Course/ Medical Decision Making/ A&P                           Medical Decision Making Amount and/or Complexity of Data Reviewed Labs: ordered. Radiology: ordered.  Risk OTC drugs. Prescription drug management.   [name] is here with right lower abdominal pain for the past week.  Unremarkable vitals.  No fever.  Differential diagnosis is bowel obstruction versus appendicitis versus cholecystitis versus  constipation versus less likely kidney stone/UTI.  We will get CBC, CMP, lipase, urinalysis, CT scan abdomen pelvis.  Will give IV fluid bolus.  She would like to start with Tylenol for pain at this time.  Urinalysis per my review and interpretation is unremarkable.  No concern for infection.  Per my review and interpretation of labs there is no significant anemia, electrolyte abnormality, kidney injury or leukocytosis.  CT scan overall unremarkable.  Large stool burden.  I suspect constipation as a cause of her pain.  Recommend MiraLAX.  Discharged in good condition.  This chart was dictated using voice recognition software.  Despite best efforts to proofread,  errors can occur which can change the documentation meaning.         Final Clinical Impression(s) / ED Diagnoses Final diagnoses:  Constipation, unspecified constipation type    Rx / DC Orders ED Discharge Orders     None         Lennice Sites, DO 07/31/21 1815

## 2021-07-31 NOTE — ED Triage Notes (Signed)
C/o RLQ abdominal pain x 1 week. Denies N/V/D.

## 2021-07-31 NOTE — Discharge Instructions (Signed)
Titrate MiraLAX to effect as discussed. 

## 2021-08-03 ENCOUNTER — Ambulatory Visit (INDEPENDENT_AMBULATORY_CARE_PROVIDER_SITE_OTHER): Payer: Medicare Other

## 2021-08-03 DIAGNOSIS — J309 Allergic rhinitis, unspecified: Secondary | ICD-10-CM

## 2021-08-08 DIAGNOSIS — H47291 Other optic atrophy, right eye: Secondary | ICD-10-CM | POA: Diagnosis not present

## 2021-08-08 DIAGNOSIS — I720 Aneurysm of carotid artery: Secondary | ICD-10-CM | POA: Diagnosis not present

## 2021-08-16 DIAGNOSIS — H02831 Dermatochalasis of right upper eyelid: Secondary | ICD-10-CM | POA: Diagnosis not present

## 2021-08-16 DIAGNOSIS — H468 Other optic neuritis: Secondary | ICD-10-CM | POA: Diagnosis not present

## 2021-08-16 DIAGNOSIS — H0288B Meibomian gland dysfunction left eye, upper and lower eyelids: Secondary | ICD-10-CM | POA: Diagnosis not present

## 2021-08-16 DIAGNOSIS — H43812 Vitreous degeneration, left eye: Secondary | ICD-10-CM | POA: Diagnosis not present

## 2021-08-16 DIAGNOSIS — H47291 Other optic atrophy, right eye: Secondary | ICD-10-CM | POA: Diagnosis not present

## 2021-08-16 DIAGNOSIS — H0102A Squamous blepharitis right eye, upper and lower eyelids: Secondary | ICD-10-CM | POA: Diagnosis not present

## 2021-08-16 DIAGNOSIS — H0288A Meibomian gland dysfunction right eye, upper and lower eyelids: Secondary | ICD-10-CM | POA: Diagnosis not present

## 2021-08-16 DIAGNOSIS — H25813 Combined forms of age-related cataract, bilateral: Secondary | ICD-10-CM | POA: Diagnosis not present

## 2021-08-16 DIAGNOSIS — H0102B Squamous blepharitis left eye, upper and lower eyelids: Secondary | ICD-10-CM | POA: Diagnosis not present

## 2021-08-16 DIAGNOSIS — H35361 Drusen (degenerative) of macula, right eye: Secondary | ICD-10-CM | POA: Diagnosis not present

## 2021-08-16 DIAGNOSIS — H02834 Dermatochalasis of left upper eyelid: Secondary | ICD-10-CM | POA: Diagnosis not present

## 2021-08-20 ENCOUNTER — Telehealth: Payer: Self-pay | Admitting: Orthopaedic Surgery

## 2021-08-20 NOTE — Telephone Encounter (Signed)
Pt submitted medical release form, Short term disability forms, and $25.00 cahs payment to [employer]. Accepted 08/20/21

## 2021-08-27 ENCOUNTER — Other Ambulatory Visit: Payer: Self-pay | Admitting: Physician Assistant

## 2021-08-28 NOTE — Pre-Procedure Instructions (Signed)
Surgical Instructions    Your procedure is scheduled on September 04, 2021.  Report to [facility] Main Entrance "A" at 9:50 A.M., then check in with the Admitting office.  Call this number if you have problems the morning of surgery:  [phone]   If you have any questions prior to your surgery date call [phone]: Open Monday-Friday 8am-4pm    Remember:  Do not eat after midnight the night before your surgery  You may drink clear liquids until 8:50 AM the morning of your surgery.   Clear liquids allowed are: Water, Non-Citrus Juices (without pulp), Carbonated Beverages, Clear Tea, Black Coffee Only (NO MILK, CREAM OR POWDERED CREAMER of any kind), and Gatorade.  Patient Instructions  The night before surgery:  No food after midnight. ONLY clear liquids after midnight  The day of surgery (if you do NOT have diabetes):  Drink ONE (1) Pre-Surgery Clear Ensure by 8:50 AM the morning of surgery. Drink in one sitting. Do not sip.  This drink was given to you during your hospital  pre-op appointment visit.  Nothing else to drink after completing the  Pre-Surgery Clear Ensure.         If you have questions, please contact your surgeon's office.      Take these medicines the morning of surgery with A SIP OF WATER:  omeprazole (PRILOSEC)   rosuvastatin (CRESTOR)    Take these medicines the morning of surgery AS NEEDED:  acetaminophen (TYLENOL)  carboxymethylcellulose (REFRESH PLUS)  fluticasone (FLONASE)  tiZANidine (ZANAFLEX)   EPINEPHrine - please let hospital staff know if you take this day of surgery  As of today, STOP taking any Aspirin (unless otherwise instructed by your surgeon) Aleve, Naproxen, Ibuprofen, Motrin, Advil, Goody's, BC's, all herbal medications, fish oil, and all vitamins. This includes your medication: diclofenac Sodium (VOLTAREN ARTHRITIS PAIN)                     Do NOT Smoke (Tobacco/Vaping) for 24 hours prior to your procedure.  If you use a CPAP at  night, you may bring your mask/headgear for your overnight stay.   Contacts, glasses, piercing's, hearing aid's, dentures or partials may not be worn into surgery, please bring cases for these belongings.    For patients admitted to the hospital, discharge time will be determined by your treatment team.   Patients discharged the day of surgery will not be allowed to drive home, and someone needs to stay with them for 24 hours.  SURGICAL WAITING ROOM VISITATION Patients having surgery or a procedure may have two support people in the waiting room. These visitors may be switched out with other visitors if needed. Children under the age of 91 must have an adult accompany them who is not the patient. If the patient needs to stay at the hospital during part of their recovery, the visitor guidelines for inpatient rooms apply.  Please refer to the [facility] website for the visitor guidelines for Inpatients (after your surgery is over and you are in a regular room).    Special instructions:   [facility]- Preparing For Surgery  Before surgery, you can play an important role. Because skin is not sterile, your skin needs to be as free of germs as possible. You can reduce the number of germs on your skin by washing with CHG (chlorahexidine gluconate) Soap before surgery.  CHG is an antiseptic cleaner which kills germs and bonds with the skin to continue killing germs even after  washing.    Oral Hygiene is also important to reduce your risk of infection.  Remember - BRUSH YOUR TEETH THE MORNING OF SURGERY WITH YOUR REGULAR TOOTHPASTE  Please do not use if you have an allergy to CHG or antibacterial soaps. If your skin becomes reddened/irritated stop using the CHG.  Do not shave (including legs and underarms) for at least 48 hours prior to first CHG shower. It is OK to shave your face.  Please follow these instructions carefully.   Shower the NIGHT BEFORE SURGERY and the MORNING OF SURGERY  If you  chose to wash your hair, wash your hair first as usual with your normal shampoo.  After you shampoo, rinse your hair and body thoroughly to remove the shampoo.  Use CHG Soap as you would any other liquid soap. You can apply CHG directly to the skin and wash gently with a scrungie or a clean washcloth.   Apply the CHG Soap to your body ONLY FROM THE NECK DOWN.  Do not use on open wounds or open sores. Avoid contact with your eyes, ears, mouth and genitals (private parts). Wash Face and genitals (private parts)  with your normal soap.   Wash thoroughly, paying special attention to the area where your surgery will be performed.  Thoroughly rinse your body with warm water from the neck down.  DO NOT shower/wash with your normal soap after using and rinsing off the CHG Soap.  Pat yourself dry with a CLEAN TOWEL.  Wear CLEAN PAJAMAS to bed the night before surgery  Place CLEAN SHEETS on your bed the night before your surgery  DO NOT SLEEP WITH PETS.   Day of Surgery: Take a shower with CHG soap. Do not wear jewelry or makeup Do not wear lotions, powders, perfumes/colognes, or deodorant. Do not shave 48 hours prior to surgery.  Men may shave face and neck. Do not bring valuables to the hospital.  [facility] is not responsible for any belongings or valuables. Do not wear nail polish, gel polish, artificial nails, or any other type of covering on natural nails (fingers and toes) If you have artificial nails or gel coating that need to be removed by a nail salon, please have this removed prior to surgery. Artificial nails or gel coating may interfere with anesthesia's ability to adequately monitor your vital signs. Wear Clean/Comfortable clothing the morning of surgery Remember to brush your teeth WITH YOUR REGULAR TOOTHPASTE.   Please read over the following fact sheets that you were given.    If you received a COVID test during your pre-op visit  it is requested that you wear a mask  when out in public, stay away from anyone that may not be feeling well and notify your surgeon if you develop symptoms. If you have been in contact with anyone that has tested positive in the last 10 days please notify you surgeon.

## 2021-08-29 ENCOUNTER — Other Ambulatory Visit: Payer: Self-pay

## 2021-08-29 ENCOUNTER — Encounter (HOSPITAL_COMMUNITY): Payer: Self-pay

## 2021-08-29 ENCOUNTER — Encounter (HOSPITAL_COMMUNITY)
Admission: RE | Admit: 2021-08-29 | Discharge: 2021-08-29 | Disposition: A | Discharge status: Home / Self Care | Payer: Medicare Other | Source: Ambulatory Visit | Attending: Orthopaedic Surgery | Admitting: Orthopaedic Surgery

## 2021-08-29 VITALS — BP 126/81 | HR 107 | Temp 98.3°F | Resp 17 | Ht 65.0 in | Wt 217.0 lb

## 2021-08-29 DIAGNOSIS — Z01812 Encounter for preprocedural laboratory examination: Secondary | ICD-10-CM | POA: Diagnosis not present

## 2021-08-29 DIAGNOSIS — I10 Essential (primary) hypertension: Secondary | ICD-10-CM | POA: Insufficient documentation

## 2021-08-29 DIAGNOSIS — Z01818 Encounter for other preprocedural examination: Secondary | ICD-10-CM

## 2021-08-29 LAB — CBC
HCT: 35.1 % — ABNORMAL LOW (ref 36.0–46.0)
Hemoglobin: 11.4 g/dL — ABNORMAL LOW (ref 12.0–15.0)
MCH: 29.9 pg (ref 26.0–34.0)
MCHC: 32.5 g/dL (ref 30.0–36.0)
MCV: 92.1 fL (ref 80.0–100.0)
Platelets: 273 10*3/uL (ref 150–400)
RBC: 3.81 MIL/uL — ABNORMAL LOW (ref 3.87–5.11)
RDW: 13.8 % (ref 11.5–15.5)
WBC: 6.8 10*3/uL (ref 4.0–10.5)
nRBC: 0 % (ref 0.0–0.2)

## 2021-08-29 LAB — BASIC METABOLIC PANEL
Anion gap: 7 (ref 5–15)
BUN: 17 mg/dL (ref 8–23)
CO2: 28 mmol/L (ref 22–32)
Calcium: 9.2 mg/dL (ref 8.9–10.3)
Chloride: 104 mmol/L (ref 98–111)
Creatinine, Ser: 1.03 mg/dL — ABNORMAL HIGH (ref 0.44–1.00)
GFR, Estimated: 57 mL/min — ABNORMAL LOW (ref >60)
Glucose, Bld: 99 mg/dL (ref 70–99)
Potassium: 3.6 mmol/L (ref 3.5–5.1)
Sodium: 139 mmol/L (ref 135–145)

## 2021-08-29 LAB — SURGICAL PCR SCREEN
MRSA, PCR: NEGATIVE
Staphylococcus aureus: NEGATIVE

## 2021-08-29 NOTE — Progress Notes (Signed)
PCP - Dr. Janett Billow Copland Cardiologist - Dr. Larae Grooms  PPM/ICD - denies   Chest x-ray - 03/07/21 EKG - 03/07/21 Stress Test - 04/18/11 ECHO - 04/03/21 Cardiac Cath - 2003  Sleep Study - around 5 years ago per pt, OSA+ CPAP - nightly  DM- denies  ASA/Blood Thinner Instructions: n/a   ERAS Protcol - yes PRE-SURGERY Ensure given at PAT  COVID TEST- n/a   Anesthesia review: yes, cardiac hx  Patient denies shortness of breath, fever, cough and chest pain at PAT appointment   All instructions explained to the patient, with a verbal understanding of the material. Patient agrees to go over the instructions while at home for a better understanding. The opportunity to ask questions was provided.

## 2021-08-30 NOTE — Progress Notes (Signed)
Anesthesia Chart Review:  Case: [id number] Date/Time: 09/04/21 1141   Procedure: LEFT TOTAL HIP ARTHROPLASTY ANTERIOR APPROACH (Left: Hip)   Anesthesia type: Spinal   Pre-op diagnosis: osteoarthritis left hip   Location: [facility] OR ROOM 05 / [facility]   Surgeons: Mcarthur Rossetti, MD       DISCUSSION: Patient is a 76 year old female scheduled for the above procedure.  History includes former smoker, HTN, HLD, chest pain (recurrent chest pain dating back > 2013; had CP/elevated troponin 4>32 with normal coronaries 12/29/18 in [facility]; "Unable to have cath from right radial approach due to subclavian tortuosity"), PE (03/13/20 in setting of COVID-19, s/p Eliquis x 3 months), GERD, hiatal hernia, migraines, achalasia, OSA (uses CPAP), anemia, cerebral aneurysm (stable 5 mm periophthalmic right ICA aneurysm 03/25/21 CTA), spinal surgery (C4-6 ACDF 04/22/01; left L5-S1 hemilaminectomy 12/27/09).  BMI is consistent with obesity.  Cardiologist is Dr. Irish Lack. He last saw her on 12/20/20. Medications adjusted due to side effects and need for better HTN control. He noted plans for left THA and wrote, "Chest /arm pain/preoperative cardiovascular eval: Stable. Negative troponin in 10/2020.  Given that she had a negative cath in 2020, no further cardiac testing needed before her hip surgery." She also had an updated clearance on 07/02/21 per Progress Note by Diona Browner, NP, "According to the Revised Cardiac Risk Index (RCRI), her Perioperative Risk of Major Cardiac Event is (%): 0.9. Her Functional Capacity in METs is: 5.81 according to the [facility] Activity Status Index (DASI). Therefore, based on ACC/AHA guidelines, patient would be at acceptable risk for the planned procedure without further cardiovascular testing."   Anesthesia team to evaluate on the day of surgery.    VS: BP 126/81   Pulse (!) 107   Temp 36.8 C (Oral)   Resp 17   Ht '5\' 5"'$  (1.651 m)   Wt 98.4 kg   SpO2 99%   BMI 36.11 kg/m     PROVIDERS: Copland, Gay Filler, MD is PCP   Larae Grooms, MD is cardiologist. He last saw on 12/20/20. She was awaiting left THA, but was waiting of better HTN control.   Orvan July, MD is pulmonologist ([facility]). Lalst visit 07/26/20. Acute hypoxic respiratory failure in setting of COVID PNA and PE resolved. Complete pulmonary rehab. Can follow-up as needed.   Zenovia Jarred, MD is GI  Star Age, MD is neurologist  Burney Gauze, MD is HEM. Evaluation 03/12/21 for normochromic normocytic anemia. Anemia was mild. He wrote, "I suppose that myelodysplasia could always be an issue at her age.  The blood smear certainly looked relatively benign for a myelodysplasia.  She has normal white cells and normal platelets.  She has normal white cell ratios.Marland KitchenMarland KitchenI think that a lot of her health issues are not from the anemia.Marland KitchenMarland KitchenIt looks like she really needs to have the surgery for her left hip.  I do not see a problem with her having this from my perspective.  We can always give her iron if her iron is low.  If her EPO level is low, we can always give her ESA."   LABS: Labs reviewed: Acceptable for surgery. LFTs normal 07/31/21.  (all labs ordered are listed, but only abnormal results are displayed)  Labs Reviewed  BASIC METABOLIC PANEL - Abnormal; Notable for the following components:      Result Value   Creatinine, Ser 1.03 (*)    GFR, Estimated 57 (*)    All other components within normal limits  CBC - Abnormal;  Notable for the following components:   RBC 3.81 (*)    Hemoglobin 11.4 (*)    HCT 35.1 (*)    All other components within normal limits  SURGICAL PCR SCREEN    Spirometry 04/04/20 (in the setting of post-COVID and required short term O2, completed Pulmonary Rehab): "FVC 0.98 L, FEV1 0.97 L.  Predicted FVC 2.35 L, FEV1 1.82 L.  Spirometry indicates severe restriction.  Status post bronchodilator response shows FVC 1.02 L, FEV1 0.97 L.  Spirometry indicates severe restriction with  no significant bronchodilator response."   IMAGES: CTA Head/Neck 08/08/21 ([facility]): Impression: 1.  No acute intracranial abnormality.  2.  No acute arterial abnormality in the head or neck.  3.  Similar appearance of a 4 to 5 mm right paraclinoid internal carotid artery aneurysm since 05/28/2014.  4.  Increasing burden of sialoliths within accessory right submandibular gland tissue. This accessory tissue has atrophied since 2016 suggesting chronic sialoadenitis.  CT Abd/pelvis 07/31/21: IMPRESSION: 1. No acute localizing process in the abdomen or pelvis. 2. Colonic diverticulosis. 3. Large stool burden. 4. Fat containing left inguinal hernia. 5. Small hiatal hernia. 6.  Aortic Atherosclerosis (ICD10-I70.0).   CTA Chest 03/09/21: IMPRESSION: - No evidence for acute pulmonary embolus. - Redemonstrated peripheral ground-glass opacities which may represent sequelae of prior infection or potentially scarring. - Small hiatal hernia. - Aortic Atherosclerosis (ICD10-I70.0).   EKG: 03/07/21: Sinus  Rhythm  Low voltage in precordial leads.   -Poor R-wave progression -may be secondary to pulmonary disease   consider old anterior infarct.    CV: RLE Venous US 06/28/21: IMPRESSION: Negative ultrasound assessment for deep venous thrombosis.    Echo 04/03/21: IMPRESSIONS   1. Left ventricular ejection fraction, by estimation, is 60 to 65%. The  left ventricle has normal function. The left ventricle has no regional  wall motion abnormalities. There is mild left ventricular hypertrophy.  Left ventricular diastolic parameters  are consistent with Grade I diastolic dysfunction (impaired relaxation).  The average left ventricular global longitudinal strain is -25.3 %. The  global longitudinal strain is normal.   2. Right ventricular systolic function is normal. The right ventricular  size is normal.   3. The mitral valve is normal in structure. No evidence of mitral valve  regurgitation.  No evidence of mitral stenosis.   4. The aortic valve is tricuspid. Aortic valve regurgitation is not  visualized. Aortic valve sclerosis is present, with no evidence of aortic  valve stenosis.   5. The inferior vena cava is normal in size with greater than 50%  respiratory variability, suggesting right atrial pressure of 3 mmHg.    US Carotid 06/09/20: IMPRESSION: - Minor bilateral carotid atherosclerosis. No hemodynamically significant ICA stenosis. Degree of narrowing less than 50% bilaterally by ultrasound criteria. - Patent antegrade vertebral flow bilaterally   Long term monitor 11/20/19-12/04/19: Normal sinus rhythm with rare PACs and PVCs, none of which correlated to symptoms. No pathologic arrhythmias. No atrial fibrillation.   Cardiac cath 12/29/18 ([facility]):  Angiographically normal coronary arteries.   Maximize medical therapy.     CT Coronary 08/20/18: FINDINGS: - Non-cardiac: See separate report from [facility]. - Pulmonary veins drain normally to the left [facility]. Cannot rule out a small PFO. - Calcium Score: 0 Agatston units. - Coronary Arteries: Right dominant with no anomalies - LM: No plaque or stenosis. - LAD system:  No plaque or stenosis. - Circumflex system: No plaque or stenosis. - RCA system:  No plaque or stenosis.  IMPRESSION: 1. Coronary artery calcium score 0 Agatston units. This suggests low risk for future cardiac events. 2.  No significant coronary disease noted.   Past Medical History:  Diagnosis Date   Achalasia 09/28/2012   Allergic rhinitis 03/01/2015   Anemia    Aneurysm ([place]) 03/01/2015   stable 5 mm periopthalmic right ICA aneurysm 03/25/21 CTA   BP (high blood pressure) 03/01/2015   Bronchitis 04/23/2018   Cephalalgia 08/24/2012   Overview:  ICD-10 cut over     Chest pain 04/04/2011   Coronary Ca Score 0, no significant CAD 08/20/18; CP with troponin 4>32 s/p LHC showing normal coronaries 12/29/18   CN (constipation)  09/28/2012   Coughing 04/24/2017   Decreased potassium in the blood 03/01/2015   Diverticulosis    Dizziness 03/01/2015   Erythropoietin deficiency anemia 03/13/2021   GERD (gastroesophageal reflux disease)    Hiatal hernia    Hypercholesterolemia 03/01/2015   Hyperlipidemia    Hypertension    Ingrown toenail 08/10/2017   Inguinal hernia    Iron deficiency anemia 03/13/2021   Migraine headache    Onychomycosis 12/08/2017   Paresthesia of arm 03/01/2015   PE (pulmonary thromboembolism) ([place]) 03/13/2020   in setting of + COVID-19, s/p Eliquis x 3 month   Schatzki's ring    Tendonitis, Achilles, right 12/08/2017   Unilateral primary osteoarthritis, left knee 10/07/2017    Past Surgical History:  Procedure Laterality Date   ABDOMINAL HYSTERECTOMY     BACK SURGERY     x 3   CARDIAC CATHETERIZATION  12/29/2018   angiographically normal coronary arteries 12/29/18 ([place])   CARPAL TUNNEL RELEASE     left wrist   CERVICAL SPINE SURGERY     HEMORRHOID SURGERY      MEDICATIONS:  acetaminophen (TYLENOL) 325 MG tablet   alum & mag hydroxide-simeth (MAALOX MAX) 400-400-40 MG/5ML suspension   Calcium Carb-Cholecalciferol (CALCIUM 600 + D PO)   Camphor-Menthol-Methyl Sal (SALONPAS) 3.02-24-08 % PTCH   carboxymethylcellulose (REFRESH PLUS) 0.5 % SOLN   diclofenac Sodium (VOLTAREN ARTHRITIS PAIN) 1 % GEL   ELDERBERRY PO   EPINEPHrine (EPIPEN 2-PAK) 0.3 mg/0.3 mL IJ SOAJ injection   fluticasone (FLONASE) 50 MCG/ACT nasal spray   losartan (COZAAR) 100 MG tablet   MAGNESIUM PO   Multiple Vitamins-Minerals (ALIVE MULTI-VITAMIN PO)   NON FORMULARY   nortriptyline (PAMELOR) 25 MG capsule   omeprazole (PRILOSEC) 40 MG capsule   potassium chloride SA (KLOR-CON M) 20 MEQ tablet   rosuvastatin (CRESTOR) 10 MG tablet   spironolactone (ALDACTONE) 25 MG tablet   tiZANidine (ZANAFLEX) 4 MG tablet   zinc gluconate 50 MG tablet   No current facility-administered medications for this  encounter.    Myra Gianotti, PA-C Surgical Short Stay/Anesthesiology [facility] Phone [phone] [facility] Phone [phone] 08/31/2021 10:25 AM

## 2021-08-31 ENCOUNTER — Encounter (HOSPITAL_COMMUNITY): Payer: Self-pay

## 2021-08-31 NOTE — Anesthesia Preprocedure Evaluation (Signed)
Anesthesia Evaluation  Patient identified by MRN, date of birth, ID band Patient awake    Reviewed: Allergy & Precautions, H&P , NPO status , Patient's Chart, lab work & pertinent test results  Airway Mallampati: II  TM Distance: >3 FB Neck ROM: Full    Dental no notable dental hx. (+) Edentulous Upper, Dental Advisory Given   Pulmonary neg pulmonary ROS, former smoker,    Pulmonary exam normal breath sounds clear to auscultation       Cardiovascular hypertension, Pt. on medications  Rhythm:Regular Rate:Normal  Echo 04/03/21: IMPRESSIONS  1. Left ventricular ejection fraction, by estimation, is 60 to 65%. The  left ventricle has normal function. The left ventricle has no regional  wall motion abnormalities. There is mild left ventricular hypertrophy.  Left ventricular diastolic parameters  are consistent with Grade I diastolic dysfunction (impaired relaxation).  The average left ventricular global longitudinal strain is -25.3 %. The  global longitudinal strain is normal.  2. Right ventricular systolic function is normal. The right ventricular  size is normal.  3. The mitral valve is normal in structure. No evidence of mitral valve  regurgitation. No evidence of mitral stenosis.  4. The aortic valve is tricuspid. Aortic valve regurgitation is not  visualized. Aortic valve sclerosis is present, with no evidence of aortic  valve stenosis.  5. The inferior vena cava is normal in size with greater than 50%  respiratory variability, suggesting right atrial pressure of 3 mmHg.     Neuro/Psych  Headaches, negative psych ROS   GI/Hepatic Neg liver ROS, hiatal hernia, GERD  Medicated,  Endo/Other  Morbid obesity  Renal/GU negative Renal ROS  negative genitourinary   Musculoskeletal  (+) Arthritis , Osteoarthritis,    Abdominal   Peds  Hematology  (+) Blood dyscrasia, anemia ,   Anesthesia Other Findings    Reproductive/Obstetrics negative OB ROS                           Anesthesia Physical Anesthesia Plan  ASA: 3  Anesthesia Plan: Spinal   Post-op Pain Management: Tylenol PO (pre-op)*   Induction: Intravenous  PONV Risk Score and Plan: 3 and Ondansetron, Dexamethasone and Propofol infusion  Airway Management Planned: Natural Airway and Simple Face Mask  Additional Equipment:   Intra-op Plan:   Post-operative Plan:   Informed Consent: I have reviewed the patients History and Physical, chart, labs and discussed the procedure including the risks, benefits and alternatives for the proposed anesthesia with the patient or authorized representative who has indicated his/her understanding and acceptance.     Dental advisory given  Plan Discussed with: CRNA  Anesthesia Plan Comments: (PAT note written 08/31/2021 by Myra Gianotti, PA-C. )       Anesthesia Quick Evaluation

## 2021-09-03 ENCOUNTER — Telehealth: Payer: Self-pay | Admitting: *Deleted

## 2021-09-03 NOTE — Telephone Encounter (Signed)
Ortho bundle Pre-op call completed. 

## 2021-09-03 NOTE — Care Plan (Signed)
[facility] RNCM call to patient to discuss her upcoming Left total hip arthroplasty with Dr. Ninfa Linden on 09/04/21. She is an Ortho bundle through [facility] and is agreeable to case management. She has a friend that will be assisting her after surgery at her home. She has a 3in1/BSC already, but will need a RW. Anticipate HHPT will be needed after a short hospital stay. Referral made to [facility] after choice provided. Reviewed all post op care instructions. Will continue to follow for needs.

## 2021-09-04 ENCOUNTER — Encounter (HOSPITAL_COMMUNITY): Payer: Self-pay | Admitting: Orthopaedic Surgery

## 2021-09-04 ENCOUNTER — Encounter (HOSPITAL_COMMUNITY)
Admission: RE | Disposition: A | Discharge status: Home Health | Payer: Self-pay | Source: Home / Self Care | Attending: Orthopaedic Surgery

## 2021-09-04 ENCOUNTER — Other Ambulatory Visit: Payer: Self-pay

## 2021-09-04 ENCOUNTER — Ambulatory Visit (HOSPITAL_COMMUNITY): Payer: Medicare Other

## 2021-09-04 ENCOUNTER — Observation Stay (HOSPITAL_COMMUNITY)
Admission: RE | Admit: 2021-09-04 | Discharge: 2021-09-06 | Disposition: A | Discharge status: Home Health | Payer: Medicare Other | Attending: Orthopaedic Surgery | Admitting: Orthopaedic Surgery

## 2021-09-04 ENCOUNTER — Ambulatory Visit (HOSPITAL_COMMUNITY): Payer: Medicare Other | Admitting: Vascular Surgery

## 2021-09-04 ENCOUNTER — Ambulatory Visit (HOSPITAL_BASED_OUTPATIENT_CLINIC_OR_DEPARTMENT_OTHER): Payer: Medicare Other | Admitting: Certified Registered Nurse Anesthetist

## 2021-09-04 ENCOUNTER — Observation Stay (HOSPITAL_COMMUNITY): Payer: Medicare Other

## 2021-09-04 DIAGNOSIS — M1612 Unilateral primary osteoarthritis, left hip: Secondary | ICD-10-CM

## 2021-09-04 DIAGNOSIS — Z87891 Personal history of nicotine dependence: Secondary | ICD-10-CM | POA: Diagnosis not present

## 2021-09-04 DIAGNOSIS — Z86711 Personal history of pulmonary embolism: Secondary | ICD-10-CM | POA: Insufficient documentation

## 2021-09-04 DIAGNOSIS — I2510 Atherosclerotic heart disease of native coronary artery without angina pectoris: Secondary | ICD-10-CM | POA: Diagnosis not present

## 2021-09-04 DIAGNOSIS — Z471 Aftercare following joint replacement surgery: Secondary | ICD-10-CM | POA: Diagnosis not present

## 2021-09-04 DIAGNOSIS — I10 Essential (primary) hypertension: Secondary | ICD-10-CM

## 2021-09-04 DIAGNOSIS — Z8616 Personal history of COVID-19: Secondary | ICD-10-CM | POA: Diagnosis not present

## 2021-09-04 DIAGNOSIS — Z96642 Presence of left artificial hip joint: Secondary | ICD-10-CM | POA: Diagnosis not present

## 2021-09-04 DIAGNOSIS — Z6836 Body mass index (BMI) 36.0-36.9, adult: Secondary | ICD-10-CM | POA: Diagnosis not present

## 2021-09-04 DIAGNOSIS — M1611 Unilateral primary osteoarthritis, right hip: Secondary | ICD-10-CM | POA: Diagnosis not present

## 2021-09-04 DIAGNOSIS — D649 Anemia, unspecified: Secondary | ICD-10-CM | POA: Diagnosis not present

## 2021-09-04 DIAGNOSIS — M1990 Unspecified osteoarthritis, unspecified site: Secondary | ICD-10-CM | POA: Diagnosis present

## 2021-09-04 DIAGNOSIS — Z79899 Other long term (current) drug therapy: Secondary | ICD-10-CM | POA: Insufficient documentation

## 2021-09-04 HISTORY — PX: TOTAL HIP ARTHROPLASTY: SHX124

## 2021-09-04 LAB — TYPE AND SCREEN
ABO/RH(D): A POS
Antibody Screen: NEGATIVE

## 2021-09-04 LAB — ABO/RH: ABO/RH(D): A POS

## 2021-09-04 SURGERY — ARTHROPLASTY, HIP, TOTAL, ANTERIOR APPROACH
Anesthesia: Spinal | Site: Hip | Laterality: Left

## 2021-09-04 MED ORDER — ALUM & MAG HYDROXIDE-SIMETH 200-200-20 MG/5ML PO SUSP: 30.0000 mL | ORAL | Status: DC | PRN

## 2021-09-04 MED ORDER — CEFAZOLIN SODIUM-DEXTROSE 1-4 GM/50ML-% IV SOLN
1.0000 g | Freq: Four times a day (QID) | INTRAVENOUS | Status: AC
  Administered 2021-09-04 – 2021-09-05 (×2): 1 g via INTRAVENOUS
  Filled 2021-09-04 (×2): qty 50

## 2021-09-04 MED ORDER — ONDANSETRON HCL 4 MG PO TABS: 4.0000 mg | ORAL_TABLET | Freq: Four times a day (QID) | ORAL | Status: DC | PRN

## 2021-09-04 MED ORDER — SODIUM CHLORIDE 0.9 % IV SOLN: INTRAVENOUS | Status: DC

## 2021-09-04 MED ORDER — 0.9 % SODIUM CHLORIDE (POUR BTL) OPTIME
TOPICAL | Status: DC | PRN
  Administered 2021-09-04: 1000 mL

## 2021-09-04 MED ORDER — CARBOXYMETHYLCELLULOSE SODIUM 0.5 % OP SOLN
1.0000 [drp] | Freq: Three times a day (TID) | OPHTHALMIC | Status: DC | PRN
Start: 2021-09-04 — End: 2021-09-04

## 2021-09-04 MED ORDER — ORAL CARE MOUTH RINSE: 15.0000 mL | Freq: Once | OROMUCOSAL | Status: AC

## 2021-09-04 MED ORDER — DEXAMETHASONE SODIUM PHOSPHATE 10 MG/ML IJ SOLN
INTRAMUSCULAR | Status: AC
  Filled 2021-09-04: qty 1

## 2021-09-04 MED ORDER — PANTOPRAZOLE SODIUM 40 MG PO TBEC
40.0000 mg | DELAYED_RELEASE_TABLET | Freq: Every day | ORAL | Status: DC
  Administered 2021-09-04 – 2021-09-06 (×3): 40 mg via ORAL
  Filled 2021-09-04 (×3): qty 1

## 2021-09-04 MED ORDER — LOSARTAN POTASSIUM 50 MG PO TABS
100.0000 mg | ORAL_TABLET | Freq: Every day | ORAL | Status: DC
  Administered 2021-09-04 – 2021-09-06 (×3): 100 mg via ORAL
  Filled 2021-09-04 (×3): qty 2

## 2021-09-04 MED ORDER — ONDANSETRON HCL 4 MG/2ML IJ SOLN
INTRAMUSCULAR | Status: AC
  Filled 2021-09-04: qty 2

## 2021-09-04 MED ORDER — TRANEXAMIC ACID-NACL 1000-0.7 MG/100ML-% IV SOLN
1000.0000 mg | INTRAVENOUS | Status: AC
  Administered 2021-09-04: 1000 mg via INTRAVENOUS
  Filled 2021-09-04: qty 100

## 2021-09-04 MED ORDER — PHENYLEPHRINE HCL-NACL 20-0.9 MG/250ML-% IV SOLN
INTRAVENOUS | Status: DC | PRN
  Administered 2021-09-04: 50 ug/min via INTRAVENOUS

## 2021-09-04 MED ORDER — ROSUVASTATIN CALCIUM 5 MG PO TABS
10.0000 mg | ORAL_TABLET | Freq: Every day | ORAL | Status: DC
  Administered 2021-09-05 – 2021-09-06 (×2): 10 mg via ORAL
  Filled 2021-09-04 (×2): qty 2

## 2021-09-04 MED ORDER — PROPOFOL 500 MG/50ML IV EMUL
INTRAVENOUS | Status: DC | PRN
  Administered 2021-09-04: 50 ug/kg/min via INTRAVENOUS

## 2021-09-04 MED ORDER — METHOCARBAMOL 1000 MG/10ML IJ SOLN
500.0000 mg | Freq: Four times a day (QID) | INTRAVENOUS | Status: DC | PRN
  Filled 2021-09-04: qty 5

## 2021-09-04 MED ORDER — FENTANYL CITRATE (PF) 100 MCG/2ML IJ SOLN: 25.0000 ug | INTRAMUSCULAR | Status: DC | PRN

## 2021-09-04 MED ORDER — POVIDONE-IODINE 10 % EX SWAB: 2.0000 | Freq: Once | CUTANEOUS | Status: DC

## 2021-09-04 MED ORDER — DEXAMETHASONE SODIUM PHOSPHATE 10 MG/ML IJ SOLN
INTRAMUSCULAR | Status: DC | PRN
  Administered 2021-09-04: 10 mg via INTRAVENOUS

## 2021-09-04 MED ORDER — POLYVINYL ALCOHOL 1.4 % OP SOLN: 1.0000 [drp] | OPHTHALMIC | Status: DC | PRN

## 2021-09-04 MED ORDER — LIDOCAINE 2% (20 MG/ML) 5 ML SYRINGE
INTRAMUSCULAR | Status: DC | PRN
  Administered 2021-09-04: 60 mg via INTRAVENOUS

## 2021-09-04 MED ORDER — OXYCODONE HCL 5 MG PO TABS
10.0000 mg | ORAL_TABLET | ORAL | Status: DC | PRN
  Administered 2021-09-04: 10 mg via ORAL
  Administered 2021-09-05: 15 mg via ORAL
  Administered 2021-09-06: 10 mg via ORAL
  Filled 2021-09-04: qty 3

## 2021-09-04 MED ORDER — PROPOFOL 10 MG/ML IV BOLUS
INTRAVENOUS | Status: DC | PRN
  Administered 2021-09-04: 25 mg via INTRAVENOUS

## 2021-09-04 MED ORDER — HYDROMORPHONE HCL 1 MG/ML IJ SOLN
0.5000 mg | INTRAMUSCULAR | Status: DC | PRN
  Administered 2021-09-04: 0.5 mg via INTRAVENOUS
  Administered 2021-09-05: 1 mg via INTRAVENOUS
  Filled 2021-09-04: qty 1

## 2021-09-04 MED ORDER — ACETAMINOPHEN 325 MG PO TABS: 325.0000 mg | ORAL_TABLET | Freq: Four times a day (QID) | ORAL | Status: DC | PRN

## 2021-09-04 MED ORDER — OXYCODONE HCL 5 MG PO TABS
5.0000 mg | ORAL_TABLET | ORAL | Status: DC | PRN
  Administered 2021-09-04 – 2021-09-05 (×2): 10 mg via ORAL
  Administered 2021-09-06: 5 mg via ORAL
  Filled 2021-09-04 (×2): qty 2
  Filled 2021-09-04: qty 1
  Filled 2021-09-04: qty 2

## 2021-09-04 MED ORDER — OXYCODONE HCL 5 MG PO TABS
ORAL_TABLET | ORAL | Status: AC
  Filled 2021-09-04: qty 2

## 2021-09-04 MED ORDER — BUPIVACAINE IN DEXTROSE 0.75-8.25 % IT SOLN
INTRATHECAL | Status: DC | PRN
  Administered 2021-09-04: 1.6 mL via INTRATHECAL

## 2021-09-04 MED ORDER — SODIUM CHLORIDE 0.9 % IR SOLN
Status: DC | PRN
  Administered 2021-09-04: 1000 mL

## 2021-09-04 MED ORDER — ZINC SULFATE 220 (50 ZN) MG PO CAPS
220.0000 mg | ORAL_CAPSULE | Freq: Every day | ORAL | Status: DC
  Administered 2021-09-04 – 2021-09-06 (×3): 220 mg via ORAL
  Filled 2021-09-04 (×3): qty 1

## 2021-09-04 MED ORDER — DIPHENHYDRAMINE HCL 12.5 MG/5ML PO ELIX: 12.5000 mg | ORAL_SOLUTION | ORAL | Status: DC | PRN

## 2021-09-04 MED ORDER — NORTRIPTYLINE HCL 25 MG PO CAPS
50.0000 mg | ORAL_CAPSULE | Freq: Every day | ORAL | Status: DC
  Administered 2021-09-04 – 2021-09-05 (×2): 50 mg via ORAL
  Filled 2021-09-04 (×3): qty 2

## 2021-09-04 MED ORDER — SPIRONOLACTONE 12.5 MG HALF TABLET
12.5000 mg | ORAL_TABLET | Freq: Every day | ORAL | Status: DC
  Administered 2021-09-04 – 2021-09-06 (×3): 12.5 mg via ORAL
  Filled 2021-09-04 (×3): qty 1

## 2021-09-04 MED ORDER — CEFAZOLIN SODIUM-DEXTROSE 2-4 GM/100ML-% IV SOLN
2.0000 g | INTRAVENOUS | Status: AC
  Administered 2021-09-04: 2 g via INTRAVENOUS
  Filled 2021-09-04: qty 100

## 2021-09-04 MED ORDER — ONDANSETRON HCL 4 MG/2ML IJ SOLN
INTRAMUSCULAR | Status: DC | PRN
  Administered 2021-09-04: 4 mg via INTRAVENOUS

## 2021-09-04 MED ORDER — CHLORHEXIDINE GLUCONATE 0.12 % MT SOLN
15.0000 mL | Freq: Once | OROMUCOSAL | Status: AC
  Administered 2021-09-04: 15 mL via OROMUCOSAL
  Filled 2021-09-04: qty 15

## 2021-09-04 MED ORDER — ZINC GLUCONATE 50 MG PO TABS: 50.0000 mg | ORAL_TABLET | Freq: Every day | ORAL | Status: DC

## 2021-09-04 MED ORDER — ASPIRIN 81 MG PO CHEW
81.0000 mg | CHEWABLE_TABLET | Freq: Two times a day (BID) | ORAL | Status: DC
  Administered 2021-09-04 – 2021-09-06 (×4): 81 mg via ORAL
  Filled 2021-09-04 (×4): qty 1

## 2021-09-04 MED ORDER — LIDOCAINE 2% (20 MG/ML) 5 ML SYRINGE
INTRAMUSCULAR | Status: AC
  Filled 2021-09-04: qty 5

## 2021-09-04 MED ORDER — ACETAMINOPHEN 500 MG PO TABS
1000.0000 mg | ORAL_TABLET | Freq: Once | ORAL | Status: AC
Start: 2021-09-04 — End: 2021-09-04
  Administered 2021-09-04: 1000 mg via ORAL
  Filled 2021-09-04: qty 2

## 2021-09-04 MED ORDER — METHOCARBAMOL 500 MG PO TABS
500.0000 mg | ORAL_TABLET | Freq: Four times a day (QID) | ORAL | Status: DC | PRN
  Administered 2021-09-04 – 2021-09-06 (×3): 500 mg via ORAL
  Filled 2021-09-04 (×2): qty 1

## 2021-09-04 MED ORDER — HYDROMORPHONE HCL 1 MG/ML IJ SOLN
INTRAMUSCULAR | Status: AC
  Filled 2021-09-04: qty 1

## 2021-09-04 MED ORDER — METHOCARBAMOL 500 MG PO TABS
ORAL_TABLET | ORAL | Status: AC
  Filled 2021-09-04: qty 1

## 2021-09-04 MED ORDER — MENTHOL 3 MG MT LOZG: 1.0000 | LOZENGE | OROMUCOSAL | Status: DC | PRN

## 2021-09-04 MED ORDER — DOCUSATE SODIUM 100 MG PO CAPS
100.0000 mg | ORAL_CAPSULE | Freq: Two times a day (BID) | ORAL | Status: DC
  Administered 2021-09-04 – 2021-09-06 (×4): 100 mg via ORAL
  Filled 2021-09-04 (×4): qty 1

## 2021-09-04 MED ORDER — METOCLOPRAMIDE HCL 5 MG PO TABS: 5.0000 mg | ORAL_TABLET | Freq: Three times a day (TID) | ORAL | Status: DC | PRN

## 2021-09-04 MED ORDER — ONDANSETRON HCL 4 MG/2ML IJ SOLN: 4.0000 mg | Freq: Four times a day (QID) | INTRAMUSCULAR | Status: DC | PRN

## 2021-09-04 MED ORDER — METOCLOPRAMIDE HCL 5 MG/ML IJ SOLN: 5.0000 mg | Freq: Three times a day (TID) | INTRAMUSCULAR | Status: DC | PRN

## 2021-09-04 MED ORDER — PHENOL 1.4 % MT LIQD: 1.0000 | OROMUCOSAL | Status: DC | PRN

## 2021-09-04 MED ORDER — LACTATED RINGERS IV SOLN: INTRAVENOUS | Status: DC

## 2021-09-04 SURGICAL SUPPLY — 58 items
APL SKNCLS STERI-STRIP NONHPOA (GAUZE/BANDAGES/DRESSINGS) ×1
BAG COUNTER SPONGE SURGICOUNT (BAG) ×2 IMPLANT
BAG SPNG CNTER NS LX DISP (BAG) ×1
BENZOIN TINCTURE PRP APPL 2/3 (GAUZE/BANDAGES/DRESSINGS) ×2 IMPLANT
BLADE CLIPPER SURG (BLADE) IMPLANT
BLADE SAW SGTL 18X1.27X75 (BLADE) ×2 IMPLANT
COVER SURGICAL LIGHT HANDLE (MISCELLANEOUS) ×2 IMPLANT
CUP SECTOR GRIPTON 50MM (Cup) ×1 IMPLANT
DRAPE C-ARM 42X72 X-RAY (DRAPES) ×2 IMPLANT
DRAPE STERI IOBAN 125X83 (DRAPES) ×2 IMPLANT
DRAPE U-SHAPE 47X51 STRL (DRAPES) ×6 IMPLANT
DRESSING AQUACEL AG SP 3.5X10 (GAUZE/BANDAGES/DRESSINGS) IMPLANT
DRSG AQUACEL AG ADV 3.5X10 (GAUZE/BANDAGES/DRESSINGS) ×2 IMPLANT
DRSG AQUACEL AG SP 3.5X10 (GAUZE/BANDAGES/DRESSINGS) ×2
DURAPREP 26ML APPLICATOR (WOUND CARE) ×2 IMPLANT
ELECT BLADE 4.0 EZ CLEAN MEGAD (MISCELLANEOUS) ×2
ELECT BLADE 6.5 EXT (BLADE) IMPLANT
ELECT REM PT RETURN 9FT ADLT (ELECTROSURGICAL) ×2
ELECTRODE BLDE 4.0 EZ CLN MEGD (MISCELLANEOUS) ×1 IMPLANT
ELECTRODE REM PT RTRN 9FT ADLT (ELECTROSURGICAL) ×1 IMPLANT
FACESHIELD WRAPAROUND (MASK) ×4 IMPLANT
FACESHIELD WRAPAROUND OR TEAM (MASK) ×2 IMPLANT
GLOVE BIOGEL PI IND STRL 8 (GLOVE) ×2 IMPLANT
GLOVE BIOGEL PI INDICATOR 8 (GLOVE) ×2
GLOVE ECLIPSE 8.0 STRL XLNG CF (GLOVE) ×2 IMPLANT
GLOVE ORTHO TXT STRL SZ7.5 (GLOVE) ×4 IMPLANT
GOWN STRL REUS W/ TWL LRG LVL3 (GOWN DISPOSABLE) ×2 IMPLANT
GOWN STRL REUS W/ TWL XL LVL3 (GOWN DISPOSABLE) ×2 IMPLANT
GOWN STRL REUS W/TWL LRG LVL3 (GOWN DISPOSABLE) ×4
GOWN STRL REUS W/TWL XL LVL3 (GOWN DISPOSABLE) ×4
HANDPIECE INTERPULSE COAX TIP (DISPOSABLE) ×2
HEAD FEM STD 32X+1 STRL (Hips) ×1 IMPLANT
KIT BASIN OR (CUSTOM PROCEDURE TRAY) ×2 IMPLANT
KIT TURNOVER KIT B (KITS) ×2 IMPLANT
LINER ACET PNNCL PLUS4 NEUTRAL (Hips) IMPLANT
MANIFOLD NEPTUNE II (INSTRUMENTS) ×2 IMPLANT
NS IRRIG 1000ML POUR BTL (IV SOLUTION) ×2 IMPLANT
PACK TOTAL JOINT (CUSTOM PROCEDURE TRAY) ×2 IMPLANT
PAD ARMBOARD 7.5X6 YLW CONV (MISCELLANEOUS) ×2 IMPLANT
PINNACLE PLUS 4 NEUTRAL (Hips) ×2 IMPLANT
SET HNDPC FAN SPRY TIP SCT (DISPOSABLE) ×1 IMPLANT
STAPLER VISISTAT 35W (STAPLE) IMPLANT
STEM FEM ACTIS STD SZ4 (Stem) ×1 IMPLANT
STRIP CLOSURE SKIN 1/2X4 (GAUZE/BANDAGES/DRESSINGS) ×4 IMPLANT
SUT ETHIBOND NAB CT1 #1 30IN (SUTURE) ×2 IMPLANT
SUT MNCRL AB 4-0 PS2 18 (SUTURE) IMPLANT
SUT VIC AB 0 CT1 27 (SUTURE) ×2
SUT VIC AB 0 CT1 27XBRD ANBCTR (SUTURE) ×1 IMPLANT
SUT VIC AB 1 CT1 27 (SUTURE) ×2
SUT VIC AB 1 CT1 27XBRD ANBCTR (SUTURE) ×1 IMPLANT
SUT VIC AB 2-0 CT1 27 (SUTURE) ×2
SUT VIC AB 2-0 CT1 TAPERPNT 27 (SUTURE) ×1 IMPLANT
TOWEL GREEN STERILE (TOWEL DISPOSABLE) ×2 IMPLANT
TOWEL GREEN STERILE FF (TOWEL DISPOSABLE) ×2 IMPLANT
TRAY CATH 16FR W/PLASTIC CATH (SET/KITS/TRAYS/PACK) IMPLANT
TRAY FOLEY W/BAG SLVR 16FR (SET/KITS/TRAYS/PACK)
TRAY FOLEY W/BAG SLVR 16FR ST (SET/KITS/TRAYS/PACK) IMPLANT
WATER STERILE IRR 1000ML POUR (IV SOLUTION) ×4 IMPLANT

## 2021-09-04 NOTE — Progress Notes (Signed)
Pt seen in room,alert/oriented in no acute distress. Pt orientated to room/equipments. Hospital valuables policy has been reviewed with no complaints. Menu guide provided. Bed in lowest position wit 3 side rails up, call bell/room phone within reach and all wheels locked.

## 2021-09-04 NOTE — Anesthesia Postprocedure Evaluation (Signed)
Anesthesia Post Note  Patient: Stephanie Parks  Procedure(s) Performed: LEFT TOTAL HIP ARTHROPLASTY ANTERIOR APPROACH (Left: Hip)     Patient location during evaluation: PACU Anesthesia Type: Spinal Level of consciousness: oriented and awake and alert Pain management: pain level controlled Vital Signs Assessment: post-procedure vital signs reviewed and stable Respiratory status: spontaneous breathing and respiratory function stable Cardiovascular status: blood pressure returned to baseline and stable Postop Assessment: no headache, no backache, no apparent nausea or vomiting, spinal receding and patient able to bend at knees Anesthetic complications: no   No notable events documented.  Last Vitals:  Vitals:   09/04/21 1430 09/04/21 1500  BP: 132/65 139/79  Pulse: 68 72  Resp: 12 10  Temp:    SpO2: 100% 94%    Last Pain:  Vitals:   09/04/21 1500  PainSc: Asleep                 Delenn Ahn,W. EDMOND

## 2021-09-04 NOTE — Transfer of Care (Signed)
Immediate Anesthesia Transfer of Care Note  Patient: Stephanie Parks  Procedure(s) Performed: LEFT TOTAL HIP ARTHROPLASTY ANTERIOR APPROACH (Left: Hip)  Patient Location: PACU  Anesthesia Type:Spinal  Level of Consciousness: awake, alert  and oriented  Airway & Oxygen Therapy: Patient Spontanous Breathing  Post-op Assessment: Report given to RN and Patient able to stick tongue midline  Post vital signs: Reviewed  Last Vitals:  Vitals Value Taken Time  BP 124/65   Temp 98.4   Pulse 74 09/04/21 1357  Resp 15 09/04/21 1357  SpO2 99 % 09/04/21 1357  Vitals shown include unvalidated device data.  Last Pain:  Vitals:   09/04/21 1032  PainSc: 0-No pain         Complications: No notable events documented.

## 2021-09-04 NOTE — Brief Op Note (Signed)
09/04/2021  1:34 PM  PATIENT:  [name]  76 y.o. female  PRE-OPERATIVE DIAGNOSIS:  osteoarthritis left hip  POST-OPERATIVE DIAGNOSIS:  osteoarthritis left hip  PROCEDURE:  Procedure(s): LEFT TOTAL HIP ARTHROPLASTY ANTERIOR APPROACH (Left)  SURGEON:  Surgeon(s) and Role:    Mcarthur Rossetti, MD - Primary  PHYSICIAN ASSISTANT:  Benita Stabile, PA-C  ANESTHESIA:   spinal  EBL:  250 mL   COUNTS:  YES  DICTATION: .Other Dictation: Dictation Number [id number]  PLAN OF CARE: Admit for overnight observation  PATIENT DISPOSITION:  PACU - hemodynamically stable.   Delay start of Pharmacological VTE agent (>24hrs) due to surgical blood loss or risk of bleeding: no

## 2021-09-04 NOTE — Anesthesia Procedure Notes (Signed)
Spinal  Patient location during procedure: OR Start time: 09/04/2021 12:12 PM End time: 09/04/2021 12:17 PM Reason for block: surgical anesthesia Staffing Performed: anesthesiologist  Anesthesiologist: Roderic Palau, MD Performed by: Roderic Palau, MD Authorized by: Roderic Palau, MD   Preanesthetic Checklist Completed: patient identified, IV checked, risks and benefits discussed, surgical consent, monitors and equipment checked, pre-op evaluation and timeout performed Spinal Block Patient position: sitting Prep: DuraPrep Patient monitoring: cardiac monitor, continuous pulse ox and blood pressure Approach: midline Location: L2-3 Injection technique: single-shot Needle Needle type: Pencan  Needle gauge: 24 G Needle length: 9 cm Assessment Sensory level: T8 Events: CSF return Additional Notes Functioning IV was confirmed and monitors were applied. Sterile prep and drape, including hand hygiene and sterile gloves were used. The patient was positioned and the spine was prepped. The skin was anesthetized with lidocaine.  Free flow of clear CSF was obtained prior to injecting local anesthetic into the CSF.  The spinal needle aspirated freely following injection.  The needle was carefully withdrawn.  The patient tolerated the procedure well.

## 2021-09-04 NOTE — Anesthesia Procedure Notes (Signed)
Procedure Name: MAC Date/Time: 09/04/2021 12:16 PM  Performed by: Renato Shin, CRNAPre-anesthesia Checklist: Patient identified, Emergency Drugs available, Suction available and Patient being monitored Patient Re-evaluated:Patient Re-evaluated prior to induction Oxygen Delivery Method: Simple face mask Preoxygenation: Pre-oxygenation with 100% oxygen Induction Type: IV induction Placement Confirmation: positive ETCO2 and breath sounds checked- equal and bilateral Dental Injury: Teeth and Oropharynx as per pre-operative assessment

## 2021-09-04 NOTE — Progress Notes (Addendum)
PT Cancellation Note  Patient Details Name: VAL FARNAM MRN: [id number] DOB: Jul 11, 1945   Cancelled Treatment:    Reason Eval/Treat Not Completed: Other (comment) Pt just getting to PACU from OR. Will follow up as pt appropriate and as schedule allows.   1556: Pt still in PACU and not appropriate for PT. Will follow up as schedule allows.  Lou Miner, DPT  Acute Rehabilitation Services  Office: [phone]   Rudean Hitt 09/04/2021, 2:29 PM

## 2021-09-04 NOTE — Op Note (Signed)
NAME: Stephanie Parks, Stephanie Parks MEDICAL RECORD NO: [id number] ACCOUNT NO: [id number] DATE OF BIRTH: 1945-09-27 FACILITY: [facility] LOCATION: [facility] PHYSICIAN: Lind Guest. Ninfa Linden, MD  Operative Report   DATE OF PROCEDURE: 09/04/2021  PREOPERATIVE DIAGNOSIS:  Primary osteoarthritis and degenerative joint disease, left hip.  POSTOPERATIVE DIAGNOSIS:  Primary osteoarthritis and degenerative joint disease, left hip.  PROCEDURE:  Left total hip arthroplasty through direct anterior approach.  IMPLANTS:  DePuy sector Gription acetabular component size 50, size 32+4 neutral polyethylene liner, size 4 ACTIS femoral component with standard offset, size 32+1 metal hip ball.  SURGEON:  Lind Guest. Ninfa Linden, MD  ASSISTANT:  Benita Stabile, PA-C.  ANESTHESIA:  Spinal.  ANTIBIOTICS:  2 g IV Ancef.  ESTIMATED BLOOD LOSS:  706 mL  COMPLICATIONS:  None.  INDICATIONS:  The patient is a 76 year old female well known to me.  She has well documented debilitating arthritis involving her left hip that now hurts on a daily basis.  It is detrimentally affecting her mobility, her quality of life and activities of  daily living.  It has been getting worse for several years now.  She has tried and failed all forms of conservative treatment and wishes at this point to proceed with a hip replacement and we have recommended this as well.  We did talk about the risk of  acute blood loss anemia, nerve or vessel injury, fracture, infection, dislocation, DVT, implant failure, leg length differences and skin and soft tissue issues.  We talked about our goals being decreased pain, improved mobility and overall improved  quality of life.  DESCRIPTION OF PROCEDURE:  After informed consent was obtained, appropriate left hip was marked.  She was brought to the operating room and sat up on the stretcher where spinal anesthesia was obtained.  She was laid in supine position on the stretcher.   Foley catheter was placed and traction  boots were placed on both her feet.  Next, she was placed supine on the Hana fracture table, the perineal post in place and both legs in line with skeletal traction device and no traction applied.  Her left  operative hip was prepped and draped in DuraPrep and sterile drapes.  A timeout was called.  She was identified correct patient, correct left hip.  I then made an incision just inferior and posterior to the anterior superior iliac spine and carried this  obliquely down the leg.  We dissected down tensor fascia lata muscle.  Tensor fascia was then divided longitudinally to proceed with direct anterior approach to the hip.  We identified and cauterized circumflex vessels and then identified the hip  capsule, opened the hip capsule in L-type format finding a moderate joint effusion.  We placed curved retractors around the medial and lateral femoral neck and made our femoral neck cut with an oscillating saw just proximal to the lesser trochanter and  completed this with an osteotome.  I placed a corkscrew guide in the femoral head and removed the femoral head in its entirety and found a wide area devoid of cartilage.  I then placed a bent Hohmann over the medial acetabular rim and removed the  remnants of acetabular labrum and other debris.  I then began reaming under direct visualization from a size 43 reamer in a stepwise increments going up to a size 49 reamer with all reamers placed under direct visualization.  Last reamer was placed under  direct fluoroscopy, so we could obtain our depth of reaming, our inclination and anteversion.  I then placed  real DePuy sector Gription acetabular component size 50 and a 32+4 polyethylene liner based on her offset and medialization of the cup.   Attention was then turned to the femur.  With the leg externally rotated to 120 degrees and extended and adducted we were able to place a Mueller retractor medially and Hohman retractor behind the greater trochanter.  We  released lateral joint capsule  and used a box cutting osteotome to enter the femoral canal and a rongeur to lateralize. We then began broaching using the ACTIS broaching system from a size 0 going up to a size 4.  With a size 4 in place, we trialed a standard offset femoral neck and a  32+1 trial hip ball, we reduced this in the pelvis and we were pleased with leg length, offset, range of motion and stability assessed mechanically and radiographically.  We then dislocated the hip, removed the trial components.  We placed the real  ACTIS femoral component with standard offset size 4 and the real 32+1 metal hip ball and again reduced this in the acetabulum.  We were pleased with the stability.  We then irrigated the soft tissue with normal saline solution.  We assessed the hip  radiographically again to make sure that we were pleased with our leg length and offset.  We then closed the joint capsule with interrupted #1 Ethibond suture followed by #1 Vicryl to close the tensor fascia.  0 Vicryl was used to close deep tissue and  2-0 Vicryl was used to close the subcutaneous tissue.  The skin was closed with staples.  An Aquacel dressing was applied.  She was taken off the Hana table and taken to recovery room in stable condition with all final counts being correct.  No  complications noted.  Of note, Benita Stabile, PA-C, assisted during the entire case from beginning to end.  His assistance was crucial and medically necessary for helping retract soft tissues as well as guiding implant placement.  He was directly involved  with layered closure of the wound.   PUS D: 09/04/2021 1:33:14 pm T: 09/04/2021 2:49:00 pm  JOB: [id number]/ [id number]

## 2021-09-04 NOTE — H&P (Signed)
TOTAL HIP ADMISSION H&P  Patient is admitted for left total hip arthroplasty.  Subjective:  Chief Complaint: left hip pain  HPI: Stephanie Parks, 76 y.o. female, has a history of pain and functional disability in the left hip(s) due to arthritis and patient has failed non-surgical conservative treatments for greater than 12 weeks to include NSAID's and/or analgesics, corticosteriod injections, flexibility and strengthening excercises, use of assistive devices, weight reduction as appropriate, and activity modification.  Onset of symptoms was gradual starting 3 years ago with gradually worsening course since that time.The patient noted no past surgery on the left hip(s).  Patient currently rates pain in the left hip at 10 out of 10 with activity. Patient has night pain, worsening of pain with activity and weight bearing, pain that interfers with activities of daily living, and pain with passive range of motion. Patient has evidence of subchondral sclerosis, periarticular osteophytes, and joint space narrowing by imaging studies. This condition presents safety issues increasing the risk of falls.  There is no current active infection.  Patient Active Problem List   Diagnosis Date Noted   Iron deficiency anemia 03/13/2021   Erythropoietin deficiency anemia 03/13/2021   Unilateral primary osteoarthritis, left hip 10/18/2020   Greater trochanteric bursitis of left hip 10/18/2020   Headache 06/07/2020   Acute respiratory failure with hypoxia (HCC) 05/02/2020   Post-COVID chronic dyspnea 05/02/2020   Pulsatile neck mass 04/26/2020   Pneumonia due to COVID-19 virus 03/12/2020   Headache disorder 01/04/2020   Seasonal allergic conjunctivitis 10/04/2019   Dysfunction of right eustachian tube 10/04/2019   Pre-diabetes 06/02/2019   Seasonal and perennial allergic rhinitis 12/22/2018   Heart palpitations 12/22/2018   History of chest pain 07/20/2018   Brain aneurysm 07/20/2018   Dyslipidemia  05/04/2018   Tendonitis, Achilles, right 12/08/2017   Unilateral primary osteoarthritis, left knee 10/07/2017   Ingrown toenail 08/10/2017   Coughing 04/24/2017   CAD (coronary artery disease) 03/01/2015   Aneurysm ([place]) 03/01/2015   Dizziness 03/01/2015   Paresthesia of arm 03/01/2015   Achalasia 09/28/2012   CN (constipation) 09/28/2012   Cephalalgia 08/24/2012   Essential hypertension 04/19/2011   Hyperlipidemia 04/19/2011   GERD (gastroesophageal reflux disease) 04/19/2011   Chest pain 04/04/2011   Past Medical History:  Diagnosis Date   Achalasia 09/28/2012   Allergic rhinitis 03/01/2015   Anemia    Aneurysm ([place]) 03/01/2015   stable 5 mm periopthalmic right ICA aneurysm 03/25/21 CTA   BP (high blood pressure) 03/01/2015   Bronchitis 04/23/2018   Cephalalgia 08/24/2012   Overview:  ICD-10 cut over     Chest pain 04/04/2011   Coronary Ca Score 0, no significant CAD 08/20/18; CP with troponin 4>32 s/p LHC showing normal coronaries 12/29/18   CN (constipation) 09/28/2012   Coughing 04/24/2017   Decreased potassium in the blood 03/01/2015   Diverticulosis    Dizziness 03/01/2015   Erythropoietin deficiency anemia 03/13/2021   GERD (gastroesophageal reflux disease)    Hiatal hernia    Hypercholesterolemia 03/01/2015   Hyperlipidemia    Hypertension    Ingrown toenail 08/10/2017   Inguinal hernia    Iron deficiency anemia 03/13/2021   Migraine headache    Onychomycosis 12/08/2017   Paresthesia of arm 03/01/2015   PE (pulmonary thromboembolism) ([place]) 03/13/2020   in setting of + COVID-19, s/p Eliquis x 3 month   Schatzki's ring    Tendonitis, Achilles, right 12/08/2017   Unilateral primary osteoarthritis, left knee 10/07/2017    Past Surgical History:  Procedure Laterality Date   ABDOMINAL HYSTERECTOMY     BACK SURGERY     x 3   CARDIAC CATHETERIZATION  12/29/2018   angiographically normal coronary arteries 12/29/18 ([place])   CARPAL TUNNEL RELEASE     left  wrist   CERVICAL SPINE SURGERY     HEMORRHOID SURGERY      Current Facility-Administered Medications  Medication Dose Route Frequency Provider Last Rate Last Admin   ceFAZolin (ANCEF) IVPB 2g/100 mL premix  2 g Intravenous On Call to OR Pete Pelt, PA-C       lactated ringers infusion   Intravenous Continuous Roderic Palau, MD 10 mL/hr at 09/04/21 1042 Continued from Pre-op at 09/04/21 1042   povidone-iodine 10 % swab 2 Application  2 Application Topical Once Pete Pelt, PA-C       tranexamic acid (CYKLOKAPRON) IVPB 1,000 mg  1,000 mg Intravenous To OR Pete Pelt, PA-C       Allergies  Allergen Reactions   Shellfish Allergy Hives and Swelling   Ace Inhibitors Other (See Comments)    Pt cannot recall her reaction but tolerates arb    Gabapentin Other (See Comments)    headaches   Pitavastatin     Unknown reaction    Sulfa Antibiotics Other (See Comments) and Rash    Fine bumps   Topiramate Palpitations    Heart race,     Social History   Tobacco Use   Smoking status: Former    Packs/day: 0.50    Years: 20.00    Total pack years: 10.00    Types: Cigarettes    Quit date: 02/19/1984    Years since quitting: 37.5   Smokeless tobacco: Never  Substance Use Topics   Alcohol use: No    Family History  Problem Relation Age of Onset   Allergic rhinitis Sister    Diabetes Sister    Hypertension Sister    Heart attack Father 51   Heart disease Father    Stomach cancer Maternal Grandmother    Heart disease Mother    Colon cancer Neg Hx    Angioedema Neg Hx    Asthma Neg Hx    Eczema Neg Hx    Urticaria Neg Hx    Immunodeficiency Neg Hx    Breast cancer Neg Hx    Migraines Neg Hx    Headache Neg Hx      Review of Systems  Musculoskeletal:  Positive for gait problem.  All other systems reviewed and are negative.   Objective:  Physical Exam Vitals reviewed.  Constitutional:      Appearance: Normal appearance.  HENT:     Head: Normocephalic  and atraumatic.  Eyes:     Extraocular Movements: Extraocular movements intact.     Pupils: Pupils are equal, round, and reactive to light.  Cardiovascular:     Rate and Rhythm: Normal rate and regular rhythm.     Pulses: Normal pulses.  Pulmonary:     Effort: Pulmonary effort is normal.     Breath sounds: Normal breath sounds.  Abdominal:     Palpations: Abdomen is soft.  Musculoskeletal:     Cervical back: Normal range of motion and neck supple.     Left hip: Tenderness and bony tenderness present. Decreased range of motion. Decreased strength.  Neurological:     Mental Status: She is alert and oriented to person, place, and time.  Psychiatric:        Behavior: Behavior normal.  Vital signs in last 24 hours: Temp:  [98.4 F (36.9 C)] 98.4 F (36.9 C) (07/18 0959) Pulse Rate:  [100] 100 (07/18 0959) Resp:  [18] 18 (07/18 0959) BP: (161)/(86) 161/86 (07/18 0959) SpO2:  [98 %] 98 % (07/18 0959) Weight:  [98.4 kg] 98.4 kg (07/18 0959)  Labs:   Estimated body mass index is 36.11 kg/m as calculated from the following:   Height as of this encounter: '5\' 5"'$  (1.651 m).   Weight as of this encounter: 98.4 kg.   Imaging Review Plain radiographs demonstrate severe degenerative joint disease of the left hip(s). The bone quality appears to be good for age and reported activity level.      Assessment/Plan:  End stage arthritis, left hip(s)  The patient history, physical examination, clinical judgement of the provider and imaging studies are consistent with end stage degenerative joint disease of the left hip(s) and total hip arthroplasty is deemed medically necessary. The treatment options including medical management, injection therapy, arthroscopy and arthroplasty were discussed at length. The risks and benefits of total hip arthroplasty were presented and reviewed. The risks due to aseptic loosening, infection, stiffness, dislocation/subluxation,  thromboembolic  complications and other imponderables were discussed.  The patient acknowledged the explanation, agreed to proceed with the plan and consent was signed. Patient is being admitted for inpatient treatment for surgery, pain control, PT, OT, prophylactic antibiotics, VTE prophylaxis, progressive ambulation and ADL's and discharge planning.The patient is planning to be discharged home with home health services

## 2021-09-05 ENCOUNTER — Encounter (HOSPITAL_COMMUNITY): Payer: Self-pay | Admitting: Orthopaedic Surgery

## 2021-09-05 DIAGNOSIS — I2510 Atherosclerotic heart disease of native coronary artery without angina pectoris: Secondary | ICD-10-CM | POA: Diagnosis not present

## 2021-09-05 DIAGNOSIS — Z87891 Personal history of nicotine dependence: Secondary | ICD-10-CM | POA: Diagnosis not present

## 2021-09-05 DIAGNOSIS — I10 Essential (primary) hypertension: Secondary | ICD-10-CM | POA: Diagnosis not present

## 2021-09-05 DIAGNOSIS — Z79899 Other long term (current) drug therapy: Secondary | ICD-10-CM | POA: Diagnosis not present

## 2021-09-05 DIAGNOSIS — Z8616 Personal history of COVID-19: Secondary | ICD-10-CM | POA: Diagnosis not present

## 2021-09-05 DIAGNOSIS — Z86711 Personal history of pulmonary embolism: Secondary | ICD-10-CM | POA: Diagnosis not present

## 2021-09-05 DIAGNOSIS — M1612 Unilateral primary osteoarthritis, left hip: Secondary | ICD-10-CM | POA: Diagnosis not present

## 2021-09-05 LAB — BASIC METABOLIC PANEL
Anion gap: 8 (ref 5–15)
BUN: 13 mg/dL (ref 8–23)
CO2: 29 mmol/L (ref 22–32)
Calcium: 8.8 mg/dL — ABNORMAL LOW (ref 8.9–10.3)
Chloride: 101 mmol/L (ref 98–111)
Creatinine, Ser: 1.08 mg/dL — ABNORMAL HIGH (ref 0.44–1.00)
GFR, Estimated: 54 mL/min — ABNORMAL LOW (ref >60)
Glucose, Bld: 150 mg/dL — ABNORMAL HIGH (ref 70–99)
Potassium: 4.1 mmol/L (ref 3.5–5.1)
Sodium: 138 mmol/L (ref 135–145)

## 2021-09-05 LAB — CBC
HCT: 32.6 % — ABNORMAL LOW (ref 36.0–46.0)
Hemoglobin: 10.8 g/dL — ABNORMAL LOW (ref 12.0–15.0)
MCH: 30.1 pg (ref 26.0–34.0)
MCHC: 33.1 g/dL (ref 30.0–36.0)
MCV: 90.8 fL (ref 80.0–100.0)
Platelets: 259 10*3/uL (ref 150–400)
RBC: 3.59 MIL/uL — ABNORMAL LOW (ref 3.87–5.11)
RDW: 13.6 % (ref 11.5–15.5)
WBC: 8.2 10*3/uL (ref 4.0–10.5)
nRBC: 0 % (ref 0.0–0.2)

## 2021-09-05 NOTE — Discharge Instructions (Signed)

## 2021-09-05 NOTE — Progress Notes (Signed)
Foley removed w/o complications. Pt understands that she is due to void.

## 2021-09-05 NOTE — Progress Notes (Signed)
Physical Therapy Treatment Patient Details Name: Stephanie Parks MRN: [id number] DOB: 1945/09/27 Today's Date: 09/05/2021   History of Present Illness 76 yo female with onset of OA on L hip with failed conservative measures was admitted on 7/18 for THA with WBAT and no movement limits.  PMHx:  Covid PNA, PE, Schatzki's ring, migraines, chest pain, diverticulosis, achalasia, anemia, R ICA aneurysm    PT Comments    Pt was seen for return to bed with short stop on BSC.  Pt is making headway with assisting to stand and controlling her hip in standing, but has not made much increase in her walking distance.  Will need to try to climb stairs tomorrow, as her plan is to try to go home with a 5 step entrance.  Follow up with her for BID tomorrow, focusing on longer standing time and sequencing stairs as tolerated.  Follow MD recommendations for discharge location.   Recommendations for follow up therapy are one component of a multi-disciplinary discharge planning process, led by the attending physician.  Recommendations may be updated based on patient status, additional functional criteria and insurance authorization.  Follow Up Recommendations  Follow physician's recommendations for discharge plan and follow up therapies     Assistance Recommended at Discharge Frequent or constant Supervision/Assistance  Patient can return home with the following A lot of help with walking and/or transfers;A little help with bathing/dressing/bathroom;Two people to help with bathing/dressing/bathroom;Assistance with cooking/housework;Direct supervision/assist for medications management;Direct supervision/assist for financial management;Assist for transportation;Help with stairs or ramp for entrance   Equipment Recommendations  Rolling walker (2 wheels)    Recommendations for Other Services       Precautions / Restrictions Precautions Precautions: Fall Restrictions Weight Bearing Restrictions: Yes LLE Weight  Bearing: Weight bearing as tolerated     Mobility  Bed Mobility Overal bed mobility: Needs Assistance Bed Mobility: Sit to Supine     Supine to sit: Min assist     General bed mobility comments: minor help for LLE and for trunk support    Transfers Overall transfer level: Needs assistance Equipment used: Rolling walker (2 wheels) Transfers: Sit to/from Stand Sit to Stand: Mod assist           General transfer comment: mod to power up from recliner elevation    Ambulation/Gait Ambulation/Gait assistance: Min assist Gait Distance (Feet): 6 Feet Assistive device: Rolling walker (2 wheels), 1 person hand held assist Gait Pattern/deviations: Step-to pattern, Wide base of support, Decreased weight shift to left (sidesteps mainly were employed) Gait velocity: reduced   Pre-gait activities: standing balance correction General Gait Details: slow shuffled steps with care to avoid overdoing on LLE, offloading to RLE   Stairs             Wheelchair Mobility    Modified Rankin (Stroke Patients Only)       Balance Overall balance assessment: Needs assistance Sitting-balance support: Feet supported Sitting balance-Leahy Scale: Fair     Standing balance support: Bilateral upper extremity supported, During functional activity Standing balance-Leahy Scale: Poor Standing balance comment: RW to support LLE                            Cognition Arousal/Alertness: Awake/alert Behavior During Therapy: WFL for tasks assessed/performed Overall Cognitive Status: Within Functional Limits for tasks assessed  General Comments: good effort and good attitude about rehab        Exercises Total Joint Exercises Ankle Circles/Pumps: AROM, 5 reps Quad Sets: AROM, 10 reps Gluteal Sets: AROM, 10 reps    General Comments General comments (skin integrity, edema, etc.): Pain is better after being up in chair and then  returning to bed      Pertinent Vitals/Pain Pain Assessment Pain Assessment: 0-10 Pain Score: 7  Breathing: normal Negative Vocalization: none Facial Expression: smiling or inexpressive Body Language: relaxed Consolability: no need to console PAINAD Score: 0 Pain Location: L hip Pain Descriptors / Indicators: Guarding, Grimacing Pain Intervention(s): Limited activity within patient's tolerance, Monitored during session, Premedicated before session, Repositioned, Ice applied    Home Living                          Prior Function            PT Goals (current goals can now be found in the care plan section) Acute Rehab PT Goals Patient Stated Goal: TO walk and return home PT Goal Formulation: With patient Time For Goal Achievement: 09/19/21 Potential to Achieve Goals: Good    Frequency    7X/week      PT Plan      Co-evaluation              AM-PAC PT "6 Clicks" Mobility   Outcome Measure  Help needed turning from your back to your side while in a flat bed without using bedrails?: A Lot Help needed moving from lying on your back to sitting on the side of a flat bed without using bedrails?: A Lot Help needed moving to and from a bed to a chair (including a wheelchair)?: A Lot Help needed standing up from a chair using your arms (e.g., wheelchair or bedside chair)?: A Lot Help needed to walk in hospital room?: A Lot Help needed climbing 3-5 steps with a railing? : Total 6 Click Score: 11    End of Session Equipment Utilized During Treatment: Gait belt Activity Tolerance: Patient limited by pain;Patient limited by fatigue Patient left: in chair;with call bell/phone within reach;with chair alarm set Nurse Communication: Mobility status PT Visit Diagnosis: Unsteadiness on feet (R26.81);Muscle weakness (generalized) (M62.81);Pain Pain - Right/Left: Left Pain - part of body: Hip     Time: 1638-4665 PT Time Calculation (min) (ACUTE ONLY): 29  min  Charges:  $Gait Training: 8-22 mins $Therapeutic Exercise: 8-22 mins      Ramond Dial 09/05/2021, 5:02 PM  Mee Hives, PT PhD Acute Rehab Dept. Number: [place] [facility] [place]

## 2021-09-05 NOTE — Progress Notes (Signed)
Subjective: 1 Day Post-Op Procedure(s) (LRB): LEFT TOTAL HIP ARTHROPLASTY ANTERIOR APPROACH (Left) Patient reports pain as moderate.    Objective: Vital signs in last 24 hours: Temp:  [97.6 F (36.4 C)-98.6 F (37 C)] 97.8 F (36.6 C) (07/18 1954) Pulse Rate:  [68-100] 91 (07/19 0526) Resp:  [10-20] 16 (07/19 0526) BP: (122-161)/(59-95) 138/69 (07/19 0526) SpO2:  [93 %-100 %] 99 % (07/19 0526) Weight:  [98.4 kg] 98.4 kg (07/18 0959)  Intake/Output from previous day: 07/18 0701 - 07/19 0700 In: 2306 [P.O.:240; I.V.:1966; IV Piggyback:100] Out: 1125 [Urine:875; Blood:250] Intake/Output this shift: No intake/output data recorded.  Recent Labs    09/05/21 0213  HGB 10.8*   Recent Labs    09/05/21 0213  WBC 8.2  RBC 3.59*  HCT 32.6*  PLT 259   Recent Labs    09/05/21 0213  NA 138  K 4.1  CL 101  CO2 29  BUN 13  CREATININE 1.08*  GLUCOSE 150*  CALCIUM 8.8*   No results for input(s): "LABPT", "INR" in the last 72 hours.  Sensation intact distally Intact pulses distally Dorsiflexion/Plantar flexion intact Incision: scant drainage   Assessment/Plan: 1 Day Post-Op Procedure(s) (LRB): LEFT TOTAL HIP ARTHROPLASTY ANTERIOR APPROACH (Left) Up with therapy      Mcarthur Rossetti 09/05/2021, 7:49 AM

## 2021-09-05 NOTE — Evaluation (Signed)
Physical Therapy Evaluation Patient Details Name: Stephanie Parks MRN: [id number] DOB: [birth year] Today's Date: 09/05/2021  History of Present Illness  76 yo female with onset of OA on L hip with failed conservative measures was admitted on 7/18 for THA with WBAT and no movement limits.  PMHx:  Covid PNA, PE, Schatzki's ring, migraines, chest pain, diverticulosis, achalasia, anemia, R ICA aneurysm  Clinical Impression  Pt was seen for progression of movement to get up to stand and walk a short trip to the chair.   Pt is declining to walk a longer trip, but did agree she wants to get there soon.  Pt is expecting to go home tomorrow, and per her plan is going to need to step up 5 steps if she wants to get there.  Will focus on time OOB to chair, and to get her to stand with greater L hip control.  Pt is able to demonstrate as instructed the hip joint exercises, and will add to her routine later today.  Follow up as time and pt allow for PM session.     Recommendations for follow up therapy are one component of a multi-disciplinary discharge planning process, led by the attending physician.  Recommendations may be updated based on patient status, additional functional criteria and insurance authorization.  Follow Up Recommendations Follow physician's recommendations for discharge plan and follow up therapies      Assistance Recommended at Discharge Frequent or constant Supervision/Assistance  Patient can return home with the following  A lot of help with walking and/or transfers;A little help with bathing/dressing/bathroom;Two people to help with bathing/dressing/bathroom;Assistance with cooking/housework;Direct supervision/assist for medications management;Direct supervision/assist for financial management;Assist for transportation;Help with stairs or ramp for entrance    Equipment Recommendations Rolling walker (2 wheels)  Recommendations for Other Services       Functional Status Assessment  Patient has had a recent decline in their functional status and demonstrates the ability to make significant improvements in function in a reasonable and predictable amount of time.     Precautions / Restrictions Precautions Precautions: Fall Restrictions Weight Bearing Restrictions: Yes LLE Weight Bearing: Weight bearing as tolerated      Mobility  Bed Mobility Overal bed mobility: Needs Assistance Bed Mobility: Supine to Sit     Supine to sit: Min assist     General bed mobility comments: minor help for LLE and for trunk support    Transfers Overall transfer level: Needs assistance Equipment used: Rolling walker (2 wheels) Transfers: Sit to/from Stand Sit to Stand: Mod assist           General transfer comment: mod to power up from bed side elevation    Ambulation/Gait Ambulation/Gait assistance: Min assist Gait Distance (Feet): 8 Feet Assistive device: Rolling walker (2 wheels), 1 person hand held assist Gait Pattern/deviations: Step-to pattern, Wide base of support, Decreased weight shift to left (sidesteps mainly were employed) Gait velocity: reduced   Pre-gait activities: standing balance ck General Gait Details: slow shuffled steps with care to avoid overdoing on LLE, offloading to RLE  Stairs            Wheelchair Mobility    Modified Rankin (Stroke Patients Only)       Balance Overall balance assessment: Needs assistance Sitting-balance support: Feet supported Sitting balance-Leahy Scale: Fair     Standing balance support: Bilateral upper extremity supported, During functional activity Standing balance-Leahy Scale: Poor Standing balance comment: RW to support LLE  Pertinent Vitals/Pain Pain Assessment Pain Assessment: 0-10 Pain Score: 8  Breathing: normal Negative Vocalization: none Facial Expression: smiling or inexpressive Body Language: relaxed Consolability: no need to console PAINAD  Score: 0 Pain Location: L hip Pain Descriptors / Indicators: Guarding, Grimacing Pain Intervention(s): Limited activity within patient's tolerance, Monitored during session, Premedicated before session, Repositioned, Ice applied    Home Living Family/patient expects to be discharged to:: Private residence Living Arrangements: Alone Available Help at Discharge: Family;Friend(s);Available 24 hours/day Type of Home: House Home Access: Stairs to enter Entrance Stairs-Rails: Psychiatric nurse of Steps: 5   Home Layout: One level Home Equipment: Cane - single point;BSC/3in1 Additional Comments: pt is hoping to get directly home    Prior Function Prior Level of Function : Independent/Modified Independent             Mobility Comments: was on [facility] for gait prior to surgery       Hand Dominance   Dominant Hand: Right    Extremity/Trunk Assessment   Upper Extremity Assessment Upper Extremity Assessment: Overall WFL for tasks assessed    Lower Extremity Assessment Lower Extremity Assessment: LLE deficits/detail LLE Deficits / Details: post op pain and weakness LLE Coordination: decreased gross motor    Cervical / Trunk Assessment Cervical / Trunk Assessment: Kyphotic (mild)  Communication   Communication: No difficulties  Cognition Arousal/Alertness: Awake/alert Behavior During Therapy: WFL for tasks assessed/performed Overall Cognitive Status: Within Functional Limits for tasks assessed                                 General Comments: good effort and good attitude about rehab        General Comments General comments (skin integrity, edema, etc.): Pt is reporting 8/10 pain after she is beginning movement    Exercises Total Joint Exercises Ankle Circles/Pumps: AROM, 5 reps Quad Sets: AROM, 10 reps Gluteal Sets: AROM, 10 reps   Assessment/Plan    PT Assessment Patient needs continued PT services  PT Problem List Decreased  strength;Decreased range of motion;Decreased activity tolerance;Decreased balance;Decreased mobility;Decreased coordination;Decreased safety awareness;Decreased skin integrity;Pain       PT Treatment Interventions DME instruction;Gait training;Functional mobility training;Therapeutic activities;Therapeutic exercise;Balance training;Neuromuscular re-education;Patient/family education    PT Goals (Current goals can be found in the Care Plan section)  Acute Rehab PT Goals Patient Stated Goal: TO walk and return home PT Goal Formulation: With patient Time For Goal Achievement: 09/19/21 Potential to Achieve Goals: Good    Frequency 7X/week     Co-evaluation               AM-PAC PT "6 Clicks" Mobility  Outcome Measure Help needed turning from your back to your side while in a flat bed without using bedrails?: A Lot Help needed moving from lying on your back to sitting on the side of a flat bed without using bedrails?: A Lot Help needed moving to and from a bed to a chair (including a wheelchair)?: A Lot Help needed standing up from a chair using your arms (e.g., wheelchair or bedside chair)?: A Lot Help needed to walk in hospital room?: A Lot Help needed climbing 3-5 steps with a railing? : Total 6 Click Score: 11    End of Session Equipment Utilized During Treatment: Gait belt Activity Tolerance: Patient limited by pain;Patient limited by fatigue Patient left: in chair;with call bell/phone within reach;with chair alarm set Nurse Communication: Mobility status PT Visit Diagnosis:  Unsteadiness on feet (R26.81);Muscle weakness (generalized) (M62.81);Pain Pain - Right/Left: Left Pain - part of body: Hip    Time: 6122-4497 PT Time Calculation (min) (ACUTE ONLY): 27 min   Charges:   PT Evaluation $PT Eval Moderate Complexity: 1 Mod PT Treatments $Therapeutic Activity: 8-22 mins       Ramond Dial 09/05/2021, 4:20 PM  Mee Hives, PT PhD Acute Rehab Dept. Number: [place]

## 2021-09-06 ENCOUNTER — Encounter: Payer: Self-pay | Admitting: Hematology & Oncology

## 2021-09-06 ENCOUNTER — Other Ambulatory Visit (HOSPITAL_COMMUNITY): Payer: Self-pay

## 2021-09-06 DIAGNOSIS — Z87891 Personal history of nicotine dependence: Secondary | ICD-10-CM | POA: Diagnosis not present

## 2021-09-06 DIAGNOSIS — M1612 Unilateral primary osteoarthritis, left hip: Secondary | ICD-10-CM | POA: Diagnosis not present

## 2021-09-06 DIAGNOSIS — Z86711 Personal history of pulmonary embolism: Secondary | ICD-10-CM | POA: Diagnosis not present

## 2021-09-06 DIAGNOSIS — G4733 Obstructive sleep apnea (adult) (pediatric): Secondary | ICD-10-CM | POA: Diagnosis not present

## 2021-09-06 DIAGNOSIS — I2510 Atherosclerotic heart disease of native coronary artery without angina pectoris: Secondary | ICD-10-CM | POA: Diagnosis not present

## 2021-09-06 DIAGNOSIS — I10 Essential (primary) hypertension: Secondary | ICD-10-CM | POA: Diagnosis not present

## 2021-09-06 DIAGNOSIS — Z8616 Personal history of COVID-19: Secondary | ICD-10-CM | POA: Diagnosis not present

## 2021-09-06 DIAGNOSIS — Z79899 Other long term (current) drug therapy: Secondary | ICD-10-CM | POA: Diagnosis not present

## 2021-09-06 MED ORDER — METHOCARBAMOL 500 MG PO TABS
500.0000 mg | ORAL_TABLET | Freq: Four times a day (QID) | ORAL | 0 refills | Status: DC | PRN
  Filled 2021-09-06: qty 30, 8d supply, fill #0

## 2021-09-06 MED ORDER — ASPIRIN 81 MG PO CHEW
81.0000 mg | CHEWABLE_TABLET | Freq: Two times a day (BID) | ORAL | 0 refills | Status: DC
  Filled 2021-09-06: qty 30, 15d supply, fill #0

## 2021-09-06 MED ORDER — OXYCODONE HCL 5 MG PO TABS
5.0000 mg | ORAL_TABLET | Freq: Four times a day (QID) | ORAL | 0 refills | Status: DC | PRN
  Filled 2021-09-06: qty 30, 4d supply, fill #0

## 2021-09-06 NOTE — Plan of Care (Signed)

## 2021-09-06 NOTE — TOC Transition Note (Addendum)
Transition of Care [facility]) - CM/SW Discharge Note   Patient Details  Name: Stephanie Parks MRN: [id number] Date of Birth: October 15, 1945  Transition of Care [facility]) CM/SW Contact:  Sharin Mons, RN Phone Number: 09/06/2021, 11:48 AM   Clinical Narrative:    Patient will DC to: home  Anticipated DC date: 09/06/2021 Family notified: yes Transport by: car        -s/p L TOTAL HIP ARTHROPLASTY, 7/18    Per MD patient ready for DC today after working with therapy. RN, patient, patient's family, and [place] notified of DC. Pt states from home alone. States her friend from out of town friend will assist with  her care once d/c. Order noted for DME. Pt states already has  BSC, only need RW. Referral made with [employer] for RW. Equipment will be delivered to bedside prior to discharge. Pt without Rx med concerns. [place] pharmacy to deliver Rx meds to bedside prior to discharge.  Post hospital f/u noted on AVS. Friend to provide transportation to home.  RNCM will sign off for now as intervention is no longer needed. Please consult [place] again if new needs arise.    Final next level of care: Home w Home Health Services Barriers to Discharge: No Barriers Identified   Patient Goals and CMS Choice     Choice offered to / list presented to : Patient  Discharge Placement                       Discharge Plan and Services   Discharge Planning Services: CM Consult            DME Arranged: Walker rolling (already has [facility]) DME Agency: [employer] Date DME Agency Contacted: 09/06/21 Time DME Agency Contacted: [phone] Representative spoke with at DME Agency: requested via text message [place] Arranged: PT [place]: [place] Date [place]: 09/06/21   Representative spoke with at [place]: prearranged per provider's office  Social Determinants of Health ([place]) Interventions     Readmission Risk Interventions     No data to display

## 2021-09-06 NOTE — Progress Notes (Signed)
Pt discharged home with son in stable condition. Discharge instructions and scripts given. DME equipment delivered to room. No immediate questions or concerns at this time and pt verbalized understanding. DC'd from unit via wheelchair.

## 2021-09-06 NOTE — Discharge Summary (Signed)
Patient ID: Stephanie Parks MRN: [id number] DOB/AGE: August 21, 1945 76 y.o.  Admit date: 09/04/2021 Discharge date: 09/06/2021  Admission Diagnoses:  Principal Problem:   Unilateral primary osteoarthritis, left hip Active Problems:   Status post total replacement of left hip   DJD (degenerative joint disease)   Discharge Diagnoses:  Same  Past Medical History:  Diagnosis Date   Achalasia 09/28/2012   Allergic rhinitis 03/01/2015   Anemia    Aneurysm ([place]) 03/01/2015   stable 5 mm periopthalmic right ICA aneurysm 03/25/21 CTA   BP (high blood pressure) 03/01/2015   Bronchitis 04/23/2018   Cephalalgia 08/24/2012   Overview:  ICD-10 cut over     Chest pain 04/04/2011   Coronary Ca Score 0, no significant CAD 08/20/18; CP with troponin 4>32 s/p LHC showing normal coronaries 12/29/18   CN (constipation) 09/28/2012   Coughing 04/24/2017   Decreased potassium in the blood 03/01/2015   Diverticulosis    Dizziness 03/01/2015   Erythropoietin deficiency anemia 03/13/2021   GERD (gastroesophageal reflux disease)    Hiatal hernia    Hypercholesterolemia 03/01/2015   Hyperlipidemia    Hypertension    Ingrown toenail 08/10/2017   Inguinal hernia    Iron deficiency anemia 03/13/2021   Migraine headache    Onychomycosis 12/08/2017   Paresthesia of arm 03/01/2015   PE (pulmonary thromboembolism) ([place]) 03/13/2020   in setting of + COVID-19, s/p Eliquis x 3 month   Schatzki's ring    Tendonitis, Achilles, right 12/08/2017   Unilateral primary osteoarthritis, left knee 10/07/2017    Surgeries: Procedure(s): LEFT TOTAL HIP ARTHROPLASTY ANTERIOR APPROACH on 09/04/2021   Consultants:   Discharged Condition: Improved  Hospital Course: Stephanie Parks is an 76 y.o. female who was admitted 09/04/2021 for operative treatment ofUnilateral primary osteoarthritis, left hip. Patient has severe unremitting pain that affects sleep, daily activities, and work/hobbies. After pre-op clearance the  patient was taken to the operating room on 09/04/2021 and underwent  Procedure(s): LEFT TOTAL HIP ARTHROPLASTY ANTERIOR APPROACH.    Patient was given perioperative antibiotics:  Anti-infectives (From admission, onward)    Start     Dose/Rate Route Frequency Ordered Stop   09/04/21 1900  ceFAZolin (ANCEF) IVPB 1 g/50 mL premix        1 g 100 mL/hr over 30 Minutes Intravenous Every 6 hours 09/04/21 1445 09/05/21 0106   09/04/21 1000  ceFAZolin (ANCEF) IVPB 2g/100 mL premix        2 g 200 mL/hr over 30 Minutes Intravenous On call to O.R. 09/04/21 0954 09/04/21 1220        Patient was given sequential compression devices, early ambulation, and chemoprophylaxis to prevent DVT.  Patient benefited maximally from hospital stay and there were no complications.    Recent vital signs: Patient Vitals for the past 24 hrs:  BP Temp Temp src Pulse Resp SpO2  09/06/21 0847 (!) 131/50 98.8 F (37.1 C) Oral 96 17 92 %  09/06/21 0437 110/61 100.3 F (37.9 C) Oral 94 -- 96 %  09/05/21 1942 139/65 99 F (37.2 C) Oral (!) 109 17 100 %  09/05/21 1602 (!) 145/77 99.2 F (37.3 C) Oral 98 18 96 %     Recent laboratory studies:  Recent Labs    09/05/21 0213  WBC 8.2  HGB 10.8*  HCT 32.6*  PLT 259  NA 138  K 4.1  CL 101  CO2 29  BUN 13  CREATININE 1.08*  GLUCOSE 150*  CALCIUM 8.8*  Discharge Medications:   Allergies as of 09/06/2021       Reactions   Shellfish Allergy Hives, Swelling   Ace Inhibitors Other (See Comments)   Pt cannot recall her reaction but tolerates arb    Gabapentin Other (See Comments)   headaches   Pitavastatin    Unknown reaction    Sulfa Antibiotics Other (See Comments), Rash   Fine bumps   Topiramate Palpitations   Heart race,         Medication List     STOP taking these medications    tiZANidine 4 MG tablet Commonly known as: Zanaflex       TAKE these medications    acetaminophen 325 MG tablet Commonly known as: TYLENOL Take 650 mg  by mouth every 6 (six) hours as needed for moderate pain or headache.   ALIVE MULTI-VITAMIN PO Take 1 tablet by mouth daily.   Aspirin Low Dose 81 MG chewable tablet Generic drug: aspirin Chew 1 tablet (81 mg total) by mouth 2 (two) times daily.   CALCIUM 600 + D PO Take 1 tablet by mouth daily.   carboxymethylcellulose 0.5 % Soln Commonly known as: REFRESH PLUS Place 1 drop into both eyes 3 (three) times daily as needed (dry eyes).   ELDERBERRY PO Take 1 capsule by mouth daily.   EPINEPHrine 0.3 mg/0.3 mL Soaj injection Commonly known as: EpiPen 2-Pak Inject 0.3 mLs (0.3 mg total) into the muscle as needed for anaphylaxis. Per allergen immunotherapy protocol   fluticasone 50 MCG/ACT nasal spray Commonly known as: FLONASE Place 2 sprays into both nostrils daily as needed for allergies.   losartan 100 MG tablet Commonly known as: COZAAR TAKE 1 TABLET BY MOUTH EVERY DAY   MAGNESIUM PO Take 1 tablet by mouth daily.   methocarbamol 500 MG tablet Commonly known as: ROBAXIN Take 1 tablet (500 mg total) by mouth every 6 (six) hours as needed for muscle spasms.   NON FORMULARY Pt uses a cpap nightly   nortriptyline 25 MG capsule Commonly known as: PAMELOR Take 2 capsules (50 mg total) by mouth at bedtime.   omeprazole 40 MG capsule Commonly known as: PRILOSEC Take 40 mg by mouth daily.   oxyCODONE 5 MG immediate release tablet Commonly known as: Oxy IR/ROXICODONE Take 1-2 tablets (5-10 mg total) by mouth every 6 (six) hours as needed for moderate pain (pain score 4-6).   potassium chloride SA 20 MEQ tablet Commonly known as: KLOR-CON M 20-40 mEq See admin instructions. Take 20 mEq by mouth every other day, alternating with 40 mEq   rosuvastatin 10 MG tablet Commonly known as: CRESTOR TAKE 1 TABLET BY MOUTH EVERY DAY   Salonpas 3.02-24-08 % Ptch Generic drug: Camphor-Menthol-Methyl Sal Place 1 patch onto the skin daily as needed (pain).   spironolactone 25 MG  tablet Commonly known as: ALDACTONE Take 1 tablet (25 mg total) by mouth daily. What changed: how much to take   zinc gluconate 50 MG tablet Take 50 mg by mouth daily.               Durable Medical Equipment  (From admission, onward)           Start     Ordered   09/04/21 1827  DME 3 n 1  Once        09/04/21 1827   09/04/21 1827  DME Walker rolling  Once       Question Answer Comment  Walker: With [place]  Patient needs a walker to treat with the following condition Status post total replacement of left hip      09/04/21 1827            Diagnostic Studies: DG Pelvis Portable  Result Date: 09/04/2021 CLINICAL DATA:  [id number] left hip prosthesis EXAM: PORTABLE PELVIS 1-2 VIEWS COMPARISON:  Jun 20, 2021 FINDINGS: There has been interval left hip prosthesis. The prosthetic components are in near anatomical alignment. Soft tissue emphysema. Skin staples. Moderate degenerative changes of the right hip joint with some narrowing of the joint space. IMPRESSION: There has been interval left hip prosthesis and the prosthetic components are in near anatomical alignment. Electronically Signed   By: Frazier Richards M.D.   On: 09/04/2021 14:52   DG HIP UNILAT WITH PELVIS 1V LEFT  Result Date: 09/04/2021 CLINICAL DATA:  Left total hip arthroplasty EXAM: DG HIP (WITH OR WITHOUT PELVIS) 1V*L* COMPARISON:  05/29/2020 FINDINGS: Three fluoroscopic images are obtained during the performance of the procedure and are provided for interpretation only. Images demonstrate left total hip arthroplasty, with prosthetic components in near anatomic alignment. No perihardware fracture. Degenerative changes in the right hip. Fluoroscopy time: 20 seconds 2.51 mGy IMPRESSION: Expected intraoperative appearance of left total hip arthroplasty. Electronically Signed   By: Merilyn Baba M.D.   On: 09/04/2021 13:31   DG C-Arm 1-60 Min-No Report  Result Date: 09/04/2021 Fluoroscopy was utilized by the  requesting physician.  No radiographic interpretation.    Disposition: Discharge disposition: 01-Home or Self Care          Follow-up Information     Mcarthur Rossetti, MD Follow up in 2 week(s).   Specialty: Orthopedic Surgery Contact information: [place] [address] [place], [name] Follow up.   Specialty: Home Health Services Why: Home health services will be provided by [facility] information: [place]  [phone]                  Signed: Mcarthur Rossetti 09/06/2021, 2:56 PM

## 2021-09-06 NOTE — Progress Notes (Signed)
Physical Therapy Treatment Patient Details Name: Stephanie Parks MRN: [id number] DOB: 1946-01-07 Today's Date: 09/06/2021   History of Present Illness 75 yo female with onset of OA on L hip with failed conservative measures was admitted on 7/18 for THA with WBAT and no movement limits.  PMHx:  Covid PNA, PE, Schatzki's ring, migraines, chest pain, diverticulosis, achalasia, anemia, R ICA aneurysm    PT Comments    Patient making progress towards physical therapy goals. Patient ambulated 25' with min guard and RW. Able to negotiate 2 stairs with L hand rail sideways and min guard. Encouraged continued mobility to/from bathroom to improve overall mobility. Patient states she will have necessary assistance at home from friends. D/c plan remains appropriate.     Recommendations for follow up therapy are one component of a multi-disciplinary discharge planning process, led by the attending physician.  Recommendations may be updated based on patient status, additional functional criteria and insurance authorization.  Follow Up Recommendations  Follow physician's recommendations for discharge plan and follow up therapies     Assistance Recommended at Discharge Frequent or constant Supervision/Assistance  Patient can return home with the following A little help with walking and/or transfers;A little help with bathing/dressing/bathroom;Assistance with cooking/housework;Assist for transportation;Help with stairs or ramp for entrance   Equipment Recommendations  Rolling Cage [place]pton (2 wheels)    Recommendations for Other Services       Precautions / Restrictions Precautions Precautions: Fall Restrictions Weight Bearing Restrictions: Yes LLE Weight Bearing: Weight bearing as tolerated     Mobility  Bed Mobility Overal bed mobility: Needs Assistance Bed Mobility: Supine to Sit     Supine to sit: Min assist     General bed mobility comments: assist for LLE towards EOB     Transfers Overall transfer level: Needs assistance Equipment used: Rolling Surie Suchocki (2 wheels) Transfers: Sit to/from Stand Sit to Stand: Mod assist, Min guard           General transfer comment: modA to power up initially from EOB. Min guard from recliner x 3 during session. Cues for hand placement    Ambulation/Gait Ambulation/Gait assistance: Min guard Gait Distance (Feet): 8 Feet (+25') Assistive device: Rolling Andrea Ferrer (2 wheels) Gait Pattern/deviations: Step-to pattern, Decreased stride length, Decreased stance time - left, Trunk flexed Gait velocity: decreased     General Gait Details: min guard for safety. Very slow steady gait. Decreased stance time on L due to pain. Cues for heel strike   Stairs Stairs: Yes Stairs assistance: Min guard Stair Management: One rail Left, Step to pattern, Sideways Number of Stairs: 2 General stair comments: able to negotiate 2 stairs sideways with min guard. Cues for sequencing   Wheelchair Mobility    Modified Rankin (Stroke Patients Only)       Balance Overall balance assessment: Needs assistance Sitting-balance support: Feet supported Sitting balance-Leahy Scale: Fair     Standing balance support: Bilateral upper extremity supported, During functional activity Standing balance-Leahy Scale: Poor                              Cognition Arousal/Alertness: Awake/alert Behavior During Therapy: WFL for tasks assessed/performed Overall Cognitive Status: Within Functional Limits for tasks assessed                                          Exercises  General Comments        Pertinent Vitals/Pain Pain Assessment Pain Assessment: Faces Faces Pain Scale: Hurts even more Pain Location: L hip Pain Descriptors / Indicators: Guarding, Grimacing Pain Intervention(s): Monitored during session, Repositioned    Home Living                          Prior Function             PT Goals (current goals can now be found in the care plan section) Acute Rehab PT Goals PT Goal Formulation: With patient Time For Goal Achievement: 09/19/21 Potential to Achieve Goals: Good Progress towards PT goals: Progressing toward goals    Frequency    7X/week      PT Plan Current plan remains appropriate    Co-evaluation              AM-PAC PT "6 Clicks" Mobility   Outcome Measure  Help needed turning from your back to your side while in a flat bed without using bedrails?: A Little Help needed moving from lying on your back to sitting on the side of a flat bed without using bedrails?: A Little Help needed moving to and from a bed to a chair (including a wheelchair)?: A Little Help needed standing up from a chair using your arms (e.g., wheelchair or bedside chair)?: A Little Help needed to walk in hospital room?: A Little Help needed climbing 3-5 steps with a railing? : A Little 6 Click Score: 18    End of Session Equipment Utilized During Treatment: Gait belt Activity Tolerance: Patient tolerated treatment well Patient left: in chair;with call bell/phone within reach Nurse Communication: Mobility status PT Visit Diagnosis: Unsteadiness on feet (R26.81);Muscle weakness (generalized) (M62.81);Pain Pain - Right/Left: Left Pain - part of body: Hip     Time: 8185-6314 PT Time Calculation (min) (ACUTE ONLY): 42 min  Charges:  $Gait Training: 23-37 mins $Therapeutic Activity: 8-22 mins                     Adolphus Hanf A. Gilford Rile PT, DPT Acute Rehabilitation Services Office [phone]    Linna Hoff 09/06/2021, 12:56 PM

## 2021-09-06 NOTE — Progress Notes (Signed)
Subjective: 2 Days Post-Op Procedure(s) (LRB): LEFT TOTAL HIP ARTHROPLASTY ANTERIOR APPROACH (Left) Patient reports pain as moderate.    Objective: Vital signs in last 24 hours: Temp:  [98.8 F (37.1 C)-100.3 F (37.9 C)] 98.8 F (37.1 C) (07/20 0847) Pulse Rate:  [94-109] 96 (07/20 0847) Resp:  [17-18] 17 (07/20 0847) BP: (110-145)/(50-77) 131/50 (07/20 0847) SpO2:  [92 %-100 %] 92 % (07/20 0847)  Intake/Output from previous day: 07/19 0701 - 07/20 0700 In: 1080 [P.O.:1080] Out: 400 [Urine:400] Intake/Output this shift: Total I/O In: -  Out: 200 [Urine:200]  Recent Labs    09/05/21 0213  HGB 10.8*   Recent Labs    09/05/21 0213  WBC 8.2  RBC 3.59*  HCT 32.6*  PLT 259   Recent Labs    09/05/21 0213  NA 138  K 4.1  CL 101  CO2 29  BUN 13  CREATININE 1.08*  GLUCOSE 150*  CALCIUM 8.8*   No results for input(s): "LABPT", "INR" in the last 72 hours.  Sensation intact distally Intact pulses distally Dorsiflexion/Plantar flexion intact Incision: scant drainage   Assessment/Plan: 2 Days Post-Op Procedure(s) (LRB): LEFT TOTAL HIP ARTHROPLASTY ANTERIOR APPROACH (Left) Up with therapy Discharge home with home health      Mcarthur Rossetti 09/06/2021, 10:19 AM

## 2021-09-07 ENCOUNTER — Telehealth: Payer: Self-pay | Admitting: *Deleted

## 2021-09-07 DIAGNOSIS — Z8616 Personal history of COVID-19: Secondary | ICD-10-CM | POA: Diagnosis not present

## 2021-09-07 DIAGNOSIS — Z86711 Personal history of pulmonary embolism: Secondary | ICD-10-CM | POA: Diagnosis not present

## 2021-09-07 DIAGNOSIS — D509 Iron deficiency anemia, unspecified: Secondary | ICD-10-CM | POA: Diagnosis not present

## 2021-09-07 DIAGNOSIS — Z7982 Long term (current) use of aspirin: Secondary | ICD-10-CM | POA: Diagnosis not present

## 2021-09-07 DIAGNOSIS — I2510 Atherosclerotic heart disease of native coronary artery without angina pectoris: Secondary | ICD-10-CM | POA: Diagnosis not present

## 2021-09-07 DIAGNOSIS — I10 Essential (primary) hypertension: Secondary | ICD-10-CM | POA: Diagnosis not present

## 2021-09-07 DIAGNOSIS — Z87891 Personal history of nicotine dependence: Secondary | ICD-10-CM | POA: Diagnosis not present

## 2021-09-07 DIAGNOSIS — K5790 Diverticulosis of intestine, part unspecified, without perforation or abscess without bleeding: Secondary | ICD-10-CM | POA: Diagnosis not present

## 2021-09-07 DIAGNOSIS — I729 Aneurysm of unspecified site: Secondary | ICD-10-CM | POA: Diagnosis not present

## 2021-09-07 DIAGNOSIS — Z96642 Presence of left artificial hip joint: Secondary | ICD-10-CM | POA: Diagnosis not present

## 2021-09-07 DIAGNOSIS — R7303 Prediabetes: Secondary | ICD-10-CM | POA: Diagnosis not present

## 2021-09-07 DIAGNOSIS — K219 Gastro-esophageal reflux disease without esophagitis: Secondary | ICD-10-CM | POA: Diagnosis not present

## 2021-09-07 DIAGNOSIS — I509 Heart failure, unspecified: Secondary | ICD-10-CM | POA: Diagnosis not present

## 2021-09-07 DIAGNOSIS — Z471 Aftercare following joint replacement surgery: Secondary | ICD-10-CM | POA: Diagnosis not present

## 2021-09-07 DIAGNOSIS — E7800 Pure hypercholesterolemia, unspecified: Secondary | ICD-10-CM | POA: Diagnosis not present

## 2021-09-07 DIAGNOSIS — G43909 Migraine, unspecified, not intractable, without status migrainosus: Secondary | ICD-10-CM | POA: Diagnosis not present

## 2021-09-07 DIAGNOSIS — H1010 Acute atopic conjunctivitis, unspecified eye: Secondary | ICD-10-CM | POA: Diagnosis not present

## 2021-09-07 NOTE — Telephone Encounter (Signed)
Attempted D/C call to patient's home and cell numbers. Left VM requesting call back.

## 2021-09-10 ENCOUNTER — Telehealth: Payer: Self-pay | Admitting: *Deleted

## 2021-09-10 NOTE — Telephone Encounter (Signed)
Ortho bundle D/C call completed after 3rd attempt. Will continue to follow for needs.

## 2021-09-12 ENCOUNTER — Telehealth: Payer: Self-pay | Admitting: *Deleted

## 2021-09-12 ENCOUNTER — Telehealth: Payer: Self-pay | Admitting: Orthopaedic Surgery

## 2021-09-12 NOTE — Telephone Encounter (Signed)
Ortho bundle 7 day call completed. 

## 2021-09-12 NOTE — Telephone Encounter (Signed)
Pt is bringing in forms that was not completed

## 2021-09-17 ENCOUNTER — Ambulatory Visit (INDEPENDENT_AMBULATORY_CARE_PROVIDER_SITE_OTHER): Payer: Medicare Other | Admitting: Orthopaedic Surgery

## 2021-09-17 ENCOUNTER — Telehealth: Payer: Self-pay | Admitting: *Deleted

## 2021-09-17 DIAGNOSIS — Z96642 Presence of left artificial hip joint: Secondary | ICD-10-CM

## 2021-09-17 MED ORDER — METHOCARBAMOL 500 MG PO TABS: 500.0000 mg | ORAL_TABLET | Freq: Four times a day (QID) | ORAL | 0 refills | Status: DC | PRN

## 2021-09-17 MED ORDER — OXYCODONE HCL 5 MG PO TABS: 5.0000 mg | ORAL_TABLET | Freq: Four times a day (QID) | ORAL | 0 refills | Status: DC | PRN

## 2021-09-17 NOTE — Progress Notes (Signed)
This is the first postoperative visit for Mrs. Stephanie Parks.  She is 2 weeks status post a left total hip arthroplasty.  She is ambulating with a rolling walker.  She states that physical therapy plans to come out to her house this week and next week.  The goal is to transition to a cane when she can.  She has been compliant with a baby aspirin twice a day.  Her calf is soft and there is no foot and ankle swelling.  Her incision on the left hip looks good.  The staples were removed and Steri-Strips applied.  She will continue to work on mobility with home health therapy.  She can stop her aspirin.  I will see her back in 4 weeks to see how she is doing overall but no x-rays are needed.  I did send in refills for her pain medicine and muscle relaxant.  All questions and concerns were answered and addressed.

## 2021-09-17 NOTE — Telephone Encounter (Signed)
Ortho bundle 14 day in office meeting completed. °

## 2021-09-18 DIAGNOSIS — E7800 Pure hypercholesterolemia, unspecified: Secondary | ICD-10-CM | POA: Diagnosis not present

## 2021-09-18 DIAGNOSIS — G43909 Migraine, unspecified, not intractable, without status migrainosus: Secondary | ICD-10-CM | POA: Diagnosis not present

## 2021-09-18 DIAGNOSIS — I509 Heart failure, unspecified: Secondary | ICD-10-CM | POA: Diagnosis not present

## 2021-09-18 DIAGNOSIS — Z471 Aftercare following joint replacement surgery: Secondary | ICD-10-CM | POA: Diagnosis not present

## 2021-09-18 DIAGNOSIS — Z86711 Personal history of pulmonary embolism: Secondary | ICD-10-CM | POA: Diagnosis not present

## 2021-09-18 DIAGNOSIS — R7303 Prediabetes: Secondary | ICD-10-CM | POA: Diagnosis not present

## 2021-09-18 DIAGNOSIS — Z87891 Personal history of nicotine dependence: Secondary | ICD-10-CM | POA: Diagnosis not present

## 2021-09-18 DIAGNOSIS — I2510 Atherosclerotic heart disease of native coronary artery without angina pectoris: Secondary | ICD-10-CM | POA: Diagnosis not present

## 2021-09-18 DIAGNOSIS — Z7982 Long term (current) use of aspirin: Secondary | ICD-10-CM | POA: Diagnosis not present

## 2021-09-18 DIAGNOSIS — D509 Iron deficiency anemia, unspecified: Secondary | ICD-10-CM | POA: Diagnosis not present

## 2021-09-18 DIAGNOSIS — Z96642 Presence of left artificial hip joint: Secondary | ICD-10-CM | POA: Diagnosis not present

## 2021-09-18 DIAGNOSIS — K219 Gastro-esophageal reflux disease without esophagitis: Secondary | ICD-10-CM | POA: Diagnosis not present

## 2021-09-18 DIAGNOSIS — K5790 Diverticulosis of intestine, part unspecified, without perforation or abscess without bleeding: Secondary | ICD-10-CM | POA: Diagnosis not present

## 2021-09-18 DIAGNOSIS — I10 Essential (primary) hypertension: Secondary | ICD-10-CM | POA: Diagnosis not present

## 2021-09-18 DIAGNOSIS — H1010 Acute atopic conjunctivitis, unspecified eye: Secondary | ICD-10-CM | POA: Diagnosis not present

## 2021-09-18 DIAGNOSIS — I729 Aneurysm of unspecified site: Secondary | ICD-10-CM | POA: Diagnosis not present

## 2021-09-18 DIAGNOSIS — Z8616 Personal history of COVID-19: Secondary | ICD-10-CM | POA: Diagnosis not present

## 2021-09-20 DIAGNOSIS — K5790 Diverticulosis of intestine, part unspecified, without perforation or abscess without bleeding: Secondary | ICD-10-CM | POA: Diagnosis not present

## 2021-09-20 DIAGNOSIS — D509 Iron deficiency anemia, unspecified: Secondary | ICD-10-CM | POA: Diagnosis not present

## 2021-09-20 DIAGNOSIS — G43909 Migraine, unspecified, not intractable, without status migrainosus: Secondary | ICD-10-CM | POA: Diagnosis not present

## 2021-09-20 DIAGNOSIS — Z96642 Presence of left artificial hip joint: Secondary | ICD-10-CM | POA: Diagnosis not present

## 2021-09-20 DIAGNOSIS — I2510 Atherosclerotic heart disease of native coronary artery without angina pectoris: Secondary | ICD-10-CM | POA: Diagnosis not present

## 2021-09-20 DIAGNOSIS — I729 Aneurysm of unspecified site: Secondary | ICD-10-CM | POA: Diagnosis not present

## 2021-09-20 DIAGNOSIS — Z87891 Personal history of nicotine dependence: Secondary | ICD-10-CM | POA: Diagnosis not present

## 2021-09-20 DIAGNOSIS — Z8616 Personal history of COVID-19: Secondary | ICD-10-CM | POA: Diagnosis not present

## 2021-09-20 DIAGNOSIS — K219 Gastro-esophageal reflux disease without esophagitis: Secondary | ICD-10-CM | POA: Diagnosis not present

## 2021-09-20 DIAGNOSIS — I10 Essential (primary) hypertension: Secondary | ICD-10-CM | POA: Diagnosis not present

## 2021-09-20 DIAGNOSIS — E7800 Pure hypercholesterolemia, unspecified: Secondary | ICD-10-CM | POA: Diagnosis not present

## 2021-09-20 DIAGNOSIS — I509 Heart failure, unspecified: Secondary | ICD-10-CM | POA: Diagnosis not present

## 2021-09-20 DIAGNOSIS — H1010 Acute atopic conjunctivitis, unspecified eye: Secondary | ICD-10-CM | POA: Diagnosis not present

## 2021-09-20 DIAGNOSIS — Z7982 Long term (current) use of aspirin: Secondary | ICD-10-CM | POA: Diagnosis not present

## 2021-09-20 DIAGNOSIS — Z86711 Personal history of pulmonary embolism: Secondary | ICD-10-CM | POA: Diagnosis not present

## 2021-09-20 DIAGNOSIS — R7303 Prediabetes: Secondary | ICD-10-CM | POA: Diagnosis not present

## 2021-09-20 DIAGNOSIS — Z471 Aftercare following joint replacement surgery: Secondary | ICD-10-CM | POA: Diagnosis not present

## 2021-09-25 DIAGNOSIS — Z7982 Long term (current) use of aspirin: Secondary | ICD-10-CM | POA: Diagnosis not present

## 2021-09-25 DIAGNOSIS — D509 Iron deficiency anemia, unspecified: Secondary | ICD-10-CM | POA: Diagnosis not present

## 2021-09-25 DIAGNOSIS — Z8616 Personal history of COVID-19: Secondary | ICD-10-CM | POA: Diagnosis not present

## 2021-09-25 DIAGNOSIS — Z471 Aftercare following joint replacement surgery: Secondary | ICD-10-CM | POA: Diagnosis not present

## 2021-09-25 DIAGNOSIS — I729 Aneurysm of unspecified site: Secondary | ICD-10-CM | POA: Diagnosis not present

## 2021-09-25 DIAGNOSIS — I2510 Atherosclerotic heart disease of native coronary artery without angina pectoris: Secondary | ICD-10-CM | POA: Diagnosis not present

## 2021-09-25 DIAGNOSIS — G43909 Migraine, unspecified, not intractable, without status migrainosus: Secondary | ICD-10-CM | POA: Diagnosis not present

## 2021-09-25 DIAGNOSIS — K219 Gastro-esophageal reflux disease without esophagitis: Secondary | ICD-10-CM | POA: Diagnosis not present

## 2021-09-25 DIAGNOSIS — I10 Essential (primary) hypertension: Secondary | ICD-10-CM | POA: Diagnosis not present

## 2021-09-25 DIAGNOSIS — K5790 Diverticulosis of intestine, part unspecified, without perforation or abscess without bleeding: Secondary | ICD-10-CM | POA: Diagnosis not present

## 2021-09-25 DIAGNOSIS — I509 Heart failure, unspecified: Secondary | ICD-10-CM | POA: Diagnosis not present

## 2021-09-25 DIAGNOSIS — E7800 Pure hypercholesterolemia, unspecified: Secondary | ICD-10-CM | POA: Diagnosis not present

## 2021-09-25 DIAGNOSIS — Z96642 Presence of left artificial hip joint: Secondary | ICD-10-CM | POA: Diagnosis not present

## 2021-09-25 DIAGNOSIS — Z87891 Personal history of nicotine dependence: Secondary | ICD-10-CM | POA: Diagnosis not present

## 2021-09-25 DIAGNOSIS — Z86711 Personal history of pulmonary embolism: Secondary | ICD-10-CM | POA: Diagnosis not present

## 2021-09-25 DIAGNOSIS — R7303 Prediabetes: Secondary | ICD-10-CM | POA: Diagnosis not present

## 2021-09-25 DIAGNOSIS — H1010 Acute atopic conjunctivitis, unspecified eye: Secondary | ICD-10-CM | POA: Diagnosis not present

## 2021-09-26 ENCOUNTER — Ambulatory Visit (INDEPENDENT_AMBULATORY_CARE_PROVIDER_SITE_OTHER): Payer: Medicare Other

## 2021-09-26 DIAGNOSIS — J309 Allergic rhinitis, unspecified: Secondary | ICD-10-CM

## 2021-09-27 ENCOUNTER — Telehealth: Payer: Self-pay | Admitting: Interventional Cardiology

## 2021-09-27 ENCOUNTER — Telehealth: Payer: Self-pay | Admitting: Family Medicine

## 2021-09-27 DIAGNOSIS — K219 Gastro-esophageal reflux disease without esophagitis: Secondary | ICD-10-CM | POA: Diagnosis not present

## 2021-09-27 DIAGNOSIS — R7303 Prediabetes: Secondary | ICD-10-CM | POA: Diagnosis not present

## 2021-09-27 DIAGNOSIS — E7800 Pure hypercholesterolemia, unspecified: Secondary | ICD-10-CM | POA: Diagnosis not present

## 2021-09-27 DIAGNOSIS — Z8616 Personal history of COVID-19: Secondary | ICD-10-CM | POA: Diagnosis not present

## 2021-09-27 DIAGNOSIS — Z7982 Long term (current) use of aspirin: Secondary | ICD-10-CM | POA: Diagnosis not present

## 2021-09-27 DIAGNOSIS — I2510 Atherosclerotic heart disease of native coronary artery without angina pectoris: Secondary | ICD-10-CM | POA: Diagnosis not present

## 2021-09-27 DIAGNOSIS — Z86711 Personal history of pulmonary embolism: Secondary | ICD-10-CM | POA: Diagnosis not present

## 2021-09-27 DIAGNOSIS — Z96642 Presence of left artificial hip joint: Secondary | ICD-10-CM | POA: Diagnosis not present

## 2021-09-27 DIAGNOSIS — I10 Essential (primary) hypertension: Secondary | ICD-10-CM | POA: Diagnosis not present

## 2021-09-27 DIAGNOSIS — D509 Iron deficiency anemia, unspecified: Secondary | ICD-10-CM | POA: Diagnosis not present

## 2021-09-27 DIAGNOSIS — K5790 Diverticulosis of intestine, part unspecified, without perforation or abscess without bleeding: Secondary | ICD-10-CM | POA: Diagnosis not present

## 2021-09-27 DIAGNOSIS — I509 Heart failure, unspecified: Secondary | ICD-10-CM | POA: Diagnosis not present

## 2021-09-27 DIAGNOSIS — Z471 Aftercare following joint replacement surgery: Secondary | ICD-10-CM | POA: Diagnosis not present

## 2021-09-27 DIAGNOSIS — G43909 Migraine, unspecified, not intractable, without status migrainosus: Secondary | ICD-10-CM | POA: Diagnosis not present

## 2021-09-27 DIAGNOSIS — I729 Aneurysm of unspecified site: Secondary | ICD-10-CM | POA: Diagnosis not present

## 2021-09-27 DIAGNOSIS — Z87891 Personal history of nicotine dependence: Secondary | ICD-10-CM | POA: Diagnosis not present

## 2021-09-27 DIAGNOSIS — H1010 Acute atopic conjunctivitis, unspecified eye: Secondary | ICD-10-CM | POA: Diagnosis not present

## 2021-09-27 MED ORDER — EPINEPHRINE 0.3 MG/0.3ML IJ SOAJ
0.3000 mg | INTRAMUSCULAR | 2 refills | Status: DC | PRN
Start: 2021-09-27 — End: 2023-03-17

## 2021-09-27 NOTE — Telephone Encounter (Signed)
STAT if HR is under 50 or over 120 (normal HR is 60-100 beats per minute)  What is your heart rate? 108  Do you have a log of your heart rate readings (document readings)? Yes   Do you have any other symptoms? Asystematic; abnormal range for patient; normal range is between 60-100

## 2021-09-27 NOTE — Telephone Encounter (Signed)
Stephanie Parks [address] [facility]) called stating the following pt vital signs:   Radial Pulse 108 resting - Outside of normal range  No other symptoms

## 2021-09-27 NOTE — Telephone Encounter (Signed)
Called pt-cardiology already contacted her.  That we will plan to monitor her pulse and take further action if tachycardia persists. She says she feels fine, she is able to check her pulse at home She does need a new EpiPen, I prescribed

## 2021-09-27 NOTE — Telephone Encounter (Signed)
Fyi.

## 2021-09-27 NOTE — Telephone Encounter (Signed)
I spoke with patient. She reports physical therapist saw her today and her heart rate was 108. This was before doing therapy.  She has not rechecked.  Patient with recent hip replacement surgery.  Patient states this is the first time her heart rate has been this high since being seen by PT.  Chart reviewed and heart rate was elevated when patient saw pharmacist earlier this year.  Patient did not want to start medication to lower her heart rate at that time.   I advised patient to limit caffeine and chocolate.  I asked her to continue to monitor heart rate and if it becomes elevated above usual again to let us know.

## 2021-09-29 ENCOUNTER — Emergency Department (HOSPITAL_BASED_OUTPATIENT_CLINIC_OR_DEPARTMENT_OTHER): Payer: Medicare Other

## 2021-09-29 ENCOUNTER — Encounter (HOSPITAL_BASED_OUTPATIENT_CLINIC_OR_DEPARTMENT_OTHER): Payer: Self-pay | Admitting: Emergency Medicine

## 2021-09-29 ENCOUNTER — Emergency Department (HOSPITAL_BASED_OUTPATIENT_CLINIC_OR_DEPARTMENT_OTHER)
Admission: EM | Admit: 2021-09-29 | Discharge: 2021-09-29 | Disposition: A | Discharge status: Home / Self Care | Payer: Medicare Other | Attending: Emergency Medicine | Admitting: Emergency Medicine

## 2021-09-29 DIAGNOSIS — M47816 Spondylosis without myelopathy or radiculopathy, lumbar region: Secondary | ICD-10-CM | POA: Diagnosis not present

## 2021-09-29 DIAGNOSIS — Z96642 Presence of left artificial hip joint: Secondary | ICD-10-CM | POA: Insufficient documentation

## 2021-09-29 DIAGNOSIS — Z7982 Long term (current) use of aspirin: Secondary | ICD-10-CM | POA: Diagnosis not present

## 2021-09-29 DIAGNOSIS — D649 Anemia, unspecified: Secondary | ICD-10-CM | POA: Diagnosis not present

## 2021-09-29 DIAGNOSIS — I10 Essential (primary) hypertension: Secondary | ICD-10-CM | POA: Diagnosis not present

## 2021-09-29 DIAGNOSIS — M25552 Pain in left hip: Secondary | ICD-10-CM | POA: Diagnosis not present

## 2021-09-29 DIAGNOSIS — R42 Dizziness and giddiness: Secondary | ICD-10-CM | POA: Insufficient documentation

## 2021-09-29 DIAGNOSIS — N390 Urinary tract infection, site not specified: Secondary | ICD-10-CM | POA: Diagnosis not present

## 2021-09-29 DIAGNOSIS — Z471 Aftercare following joint replacement surgery: Secondary | ICD-10-CM | POA: Diagnosis not present

## 2021-09-29 LAB — CBC WITH DIFFERENTIAL/PLATELET
Abs Immature Granulocytes: 0.02 10*3/uL (ref 0.00–0.07)
Basophils Absolute: 0 10*3/uL (ref 0.0–0.1)
Basophils Relative: 0 %
Eosinophils Absolute: 0.4 10*3/uL (ref 0.0–0.5)
Eosinophils Relative: 6 %
HCT: 30.6 % — ABNORMAL LOW (ref 36.0–46.0)
Hemoglobin: 9.9 g/dL — ABNORMAL LOW (ref 12.0–15.0)
Immature Granulocytes: 0 %
Lymphocytes Relative: 26 %
Lymphs Abs: 1.8 10*3/uL (ref 0.7–4.0)
MCH: 29.6 pg (ref 26.0–34.0)
MCHC: 32.4 g/dL (ref 30.0–36.0)
MCV: 91.6 fL (ref 80.0–100.0)
Monocytes Absolute: 0.4 10*3/uL (ref 0.1–1.0)
Monocytes Relative: 6 %
Neutro Abs: 4.3 10*3/uL (ref 1.7–7.7)
Neutrophils Relative %: 62 %
Platelets: 332 10*3/uL (ref 150–400)
RBC: 3.34 MIL/uL — ABNORMAL LOW (ref 3.87–5.11)
RDW: 14 % (ref 11.5–15.5)
WBC: 7 10*3/uL (ref 4.0–10.5)
nRBC: 0 % (ref 0.0–0.2)

## 2021-09-29 LAB — URINALYSIS, ROUTINE W REFLEX MICROSCOPIC
Bilirubin Urine: NEGATIVE
Glucose, UA: NEGATIVE mg/dL
Hgb urine dipstick: NEGATIVE
Ketones, ur: NEGATIVE mg/dL
Nitrite: NEGATIVE
Protein, ur: NEGATIVE mg/dL
Specific Gravity, Urine: 1.02 (ref 1.005–1.030)
pH: 5 (ref 5.0–8.0)

## 2021-09-29 LAB — URINALYSIS, MICROSCOPIC (REFLEX)

## 2021-09-29 LAB — COMPREHENSIVE METABOLIC PANEL
ALT: 10 U/L (ref 0–44)
AST: 14 U/L — ABNORMAL LOW (ref 15–41)
Albumin: 3.3 g/dL — ABNORMAL LOW (ref 3.5–5.0)
Alkaline Phosphatase: 98 U/L (ref 38–126)
Anion gap: 7 (ref 5–15)
BUN: 12 mg/dL (ref 8–23)
CO2: 30 mmol/L (ref 22–32)
Calcium: 9.1 mg/dL (ref 8.9–10.3)
Chloride: 101 mmol/L (ref 98–111)
Creatinine, Ser: 0.94 mg/dL (ref 0.44–1.00)
GFR, Estimated: >60 mL/min (ref >60)
Glucose, Bld: 104 mg/dL — ABNORMAL HIGH (ref 70–99)
Potassium: 4 mmol/L (ref 3.5–5.1)
Sodium: 138 mmol/L (ref 135–145)
Total Bilirubin: 0.4 mg/dL (ref 0.3–1.2)
Total Protein: 7 g/dL (ref 6.5–8.1)

## 2021-09-29 MED ORDER — MECLIZINE HCL 25 MG PO TABS: 25.0000 mg | ORAL_TABLET | Freq: Three times a day (TID) | ORAL | 0 refills | Status: DC | PRN

## 2021-09-29 MED ORDER — CEPHALEXIN 500 MG PO CAPS: 500.0000 mg | ORAL_CAPSULE | Freq: Two times a day (BID) | ORAL | 0 refills | Status: DC

## 2021-09-29 MED ORDER — CEPHALEXIN 250 MG PO CAPS
500.0000 mg | ORAL_CAPSULE | Freq: Once | ORAL | Status: AC
  Administered 2021-09-29: 500 mg via ORAL
  Filled 2021-09-29: qty 2

## 2021-09-29 MED ORDER — MECLIZINE HCL 25 MG PO TABS
25.0000 mg | ORAL_TABLET | Freq: Once | ORAL | Status: AC
Start: 2021-09-29 — End: 2021-09-29
  Administered 2021-09-29: 25 mg via ORAL
  Filled 2021-09-29: qty 1

## 2021-09-29 NOTE — ED Provider Notes (Signed)
[facility] Provider Note   CSN: [id number] Arrival date & time: 09/29/21  1856     History  Chief Complaint  Patient presents with   Dizziness    Stephanie Parks is a 76 y.o. female.  Patient is a 76 year old female with a history of hypertension, GERD, hyperlipidemia, right stable periophthalmic ICA aneurysm and recent hip replacement last month.  She presents with dizziness.  She states she was turning to get out of bed this afternoon and had sudden onset of dizziness.  She described a spinning sensation.  She says it is worse when she turns her head.  She said it had eased off and then came back again.  She does not complain of much dizziness right now.  No headache.  No vision changes.  No speech deficits.  No numbness or weakness to her extremities.  No chest pain or shortness of breath.  No history of similar symptoms in the past.  No nausea or vomiting.  No recent URI symptoms.       Home Medications Prior to Admission medications   Medication Sig Start Date End Date Taking? Authorizing Provider  cephALEXin (KEFLEX) 500 MG capsule Take 1 capsule (500 mg total) by mouth 2 (two) times daily. 09/29/21  Yes Malvin Johns, MD  meclizine (ANTIVERT) 25 MG tablet Take 1 tablet (25 mg total) by mouth 3 (three) times daily as needed for dizziness. 09/29/21  Yes Malvin Johns, MD  acetaminophen (TYLENOL) 325 MG tablet Take 650 mg by mouth every 6 (six) hours as needed for moderate pain or headache.    [provider]  aspirin 81 MG chewable tablet Chew 1 tablet (81 mg total) by mouth 2 (two) times daily. 09/06/21   Mcarthur Rossetti, MD  Calcium Carb-Cholecalciferol (CALCIUM 600 + D PO) Take 1 tablet by mouth daily.    [provider]  Camphor-Menthol-Methyl Sal (SALONPAS) 3.02-24-08 % PTCH Place 1 patch onto the skin daily as needed (pain).    [provider]  carboxymethylcellulose (REFRESH PLUS) 0.5 % SOLN Place 1 drop into  both eyes 3 (three) times daily as needed (dry eyes).    [provider]  ELDERBERRY PO Take 1 capsule by mouth daily.    [provider]  EPINEPHrine (EPIPEN 2-PAK) 0.3 mg/0.3 mL IJ SOAJ injection Inject 0.3 mg into the muscle as needed for anaphylaxis. Per allergen immunotherapy protocol 09/27/21   Copland, Gay Filler, MD  fluticasone (FLONASE) 50 MCG/ACT nasal spray Place 2 sprays into both nostrils daily as needed for allergies. 04/11/21   Copland, Gay Filler, MD  losartan (COZAAR) 100 MG tablet TAKE 1 TABLET BY MOUTH EVERY DAY 03/13/21   Jettie Booze, MD  MAGNESIUM PO Take 1 tablet by mouth daily.    [provider]  methocarbamol (ROBAXIN) 500 MG tablet Take 1 tablet (500 mg total) by mouth every 6 (six) hours as needed for muscle spasms. 09/17/21   Mcarthur Rossetti, MD  Multiple Vitamins-Minerals (ALIVE MULTI-VITAMIN PO) Take 1 tablet by mouth daily.    [provider]  NON FORMULARY Pt uses a cpap nightly    [provider]  nortriptyline (PAMELOR) 25 MG capsule Take 2 capsules (50 mg total) by mouth at bedtime. 03/21/21   Lomax, Amy, NP  omeprazole (PRILOSEC) 40 MG capsule Take 40 mg by mouth daily. 10/05/20   [provider]  oxyCODONE (OXY IR/ROXICODONE) 5 MG immediate release tablet Take 1-2 tablets (5-10 mg total) by  mouth every 6 (six) hours as needed for moderate pain (pain score 4-6). 09/17/21   Mcarthur Rossetti, MD  potassium chloride SA (KLOR-CON M) 20 MEQ tablet 20-40 mEq See admin instructions. Take 20 mEq by mouth every other day, alternating with 40 mEq 01/24/21   Jettie Booze, MD  rosuvastatin (CRESTOR) 10 MG tablet TAKE 1 TABLET BY MOUTH EVERY DAY 01/17/21   Copland, Gay Filler, MD  spironolactone (ALDACTONE) 25 MG tablet Take 1 tablet (25 mg total) by mouth daily. Patient taking differently: Take 12.5 mg by mouth daily. 05/15/21   Jettie Booze, MD  zinc gluconate 50 MG tablet Take 50 mg by mouth  daily.    [provider]      Allergies    Shellfish allergy, Ace inhibitors, Gabapentin, Pitavastatin, Sulfa antibiotics, and Topiramate    Review of Systems   Review of Systems  Constitutional:  Negative for chills, diaphoresis, fatigue and fever.  HENT:  Negative for congestion, rhinorrhea and sneezing.   Eyes: Negative.   Respiratory:  Negative for cough, chest tightness and shortness of breath.   Cardiovascular:  Negative for chest pain and leg swelling.  Gastrointestinal:  Negative for abdominal pain, blood in stool, diarrhea, nausea and vomiting.  Genitourinary:  Negative for difficulty urinating, flank pain, frequency and hematuria.  Musculoskeletal:  Negative for arthralgias and back pain.  Skin:  Negative for rash.  Neurological:  Positive for dizziness. Negative for speech difficulty, weakness, numbness and headaches.    Physical Exam Updated Vital Signs BP (!) 143/76 (BP Location: Right Arm)   Pulse 81   Temp 98 F (36.7 C) (Oral)   Resp 16   Ht '5\' 5"'$  (1.651 m)   Wt 98.4 kg   SpO2 100%   BMI 36.11 kg/m  Physical Exam Constitutional:      Appearance: She is well-developed.  HENT:     Head: Normocephalic and atraumatic.  Eyes:     Pupils: Pupils are equal, round, and reactive to light.     Comments: No nystagmus, visual fields full to confrontation, HINTS exam negative  Cardiovascular:     Rate and Rhythm: Normal rate and regular rhythm.     Heart sounds: Normal heart sounds.  Pulmonary:     Effort: Pulmonary effort is normal. No respiratory distress.     Breath sounds: Normal breath sounds. No wheezing or rales.  Chest:     Chest wall: No tenderness.  Abdominal:     General: Bowel sounds are normal.     Palpations: Abdomen is soft.     Tenderness: There is no abdominal tenderness. There is no guarding or rebound.  Musculoskeletal:        General: Normal range of motion.     Cervical back: Normal range of motion and neck supple.   Lymphadenopathy:     Cervical: No cervical adenopathy.  Skin:    General: Skin is warm and dry.     Findings: No rash.  Neurological:     Mental Status: She is alert and oriented to person, place, and time.     Comments: Motor 5/5 all extremities (has limited ability to do a straight leg raise on the left due to recent hip surgery, but normal lower leg strength) Sensation grossly intact to LT all extremities Finger to Nose intact, no pronator drift CN II-XII grossly intact       ED Results / Procedures / Treatments   Labs (all labs ordered are listed, but only  abnormal results are displayed) Labs Reviewed  CBC WITH DIFFERENTIAL/PLATELET - Abnormal; Notable for the following components:      Result Value   RBC 3.34 (*)    Hemoglobin 9.9 (*)    HCT 30.6 (*)    All other components within normal limits  COMPREHENSIVE METABOLIC PANEL - Abnormal; Notable for the following components:   Glucose, Bld 104 (*)    Albumin 3.3 (*)    AST 14 (*)    All other components within normal limits  URINALYSIS, ROUTINE W REFLEX MICROSCOPIC - Abnormal; Notable for the following components:   Leukocytes,Ua SMALL (*)    All other components within normal limits  URINALYSIS, MICROSCOPIC (REFLEX) - Abnormal; Notable for the following components:   Bacteria, UA FEW (*)    All other components within normal limits    EKG EKG Interpretation  Date/Time:  Saturday September 29 2021 19:13:24 EDT Ventricular Rate:  95 PR Interval:  194 QRS Duration: 88 QT Interval:  342 QTC Calculation: 429 R Axis:   2 Text Interpretation: Normal sinus rhythm Cannot rule out Anterior infarct , age undetermined Abnormal ECG When compared with ECG of 03-Jan-2021 14:09, PREVIOUS ECG IS PRESENT since last tracing no significant change Confirmed by Malvin Johns [phone]) on 09/29/2021 7:25:51 PM  Radiology DG Hip Unilat W or Wo Pelvis 2-3 Views Left  Result Date: 09/29/2021 CLINICAL DATA:  Pain in hip history of hip  replacement EXAM: DG HIP (WITH OR WITHOUT PELVIS) 2-3V LEFT COMPARISON:  Radiographs 09/04/2021 FINDINGS: Left THA. No radiographic evidence of loosening. No acute fracture or dislocation. Degenerative changes pubic symphysis, right hip, SI joints and lumbar spine. IMPRESSION: Left THA.  No evidence of loosening or fracture. Electronically Signed   By: Placido Sou M.D.   On: 09/29/2021 22:50   CT Head Wo Contrast  Result Date: 09/29/2021 CLINICAL DATA:  1 hour of dizziness vertigo. EXAM: CT HEAD WITHOUT CONTRAST TECHNIQUE: Contiguous axial images were obtained from the base of the skull through the vertex without intravenous contrast. RADIATION DOSE REDUCTION: This exam was performed according to the departmental dose-optimization program which includes automated exposure control, adjustment of the mA and/or kV according to patient size and/or use of iterative reconstruction technique. COMPARISON:  Head CT March 25, 2021. FINDINGS: Brain: No evidence of acute infarction, hemorrhage, hydrocephalus, extra-axial collection or mass lesion/mass effect. Vascular: No hyperdense vessel or unexpected calcification. Skull: Hyperostosis frontalis. Negative for fracture or focal lesion. Sinuses/Orbits: Visualized portions of the paranasal sinuses and ethmoid air cells are predominantly clear. Orbits are grossly unremarkable. Other: Mastoid air cells are predominantly clear. IMPRESSION: No acute intracranial abnormality. Electronically Signed   By: Dahlia Bailiff M.D.   On: 09/29/2021 19:46    Procedures Procedures    Medications Ordered in ED Medications  meclizine (ANTIVERT) tablet 25 mg (25 mg Oral Given 09/29/21 1929)  cephALEXin (KEFLEX) capsule 500 mg (500 mg Oral Given 09/29/21 2306)    ED Course/ Medical Decision Making/ A&P                           Medical Decision Making Amount and/or Complexity of Data Reviewed Labs: ordered. Radiology: ordered.  Risk Prescription drug  management.   Patient is a 76 year old female who presents with dizziness.  It started after she was turning over in the bed.  It is worse with head movements.  It sounds vertiginous in nature.  She had a head CT which shows no  acute abnormality.  She does not have any neurologic deficits or suggestions of a stroke.  Hints testing was negative for indication of a central etiology.  She was given dose of meclizine and is feeling much better after this.  She says the dizziness is minimal.  We did not ambulate her given her recent hip surgery with pain but she does not have any ongoing dizziness.  Her labs are nonconcerning.  Her hemoglobin is 9.9.  On chart review, it looks like it has been ranging between 10 and 11.  It is just under her baseline values.  This is likely from the recent surgery.  Her urine is somewhat suggestive of infection.  We will start Keflex.  Her EKG does not show any ischemic changes or arrhythmias.  She was discharged home in good condition.  She was given a prescription for Keflex and meclizine.  She was encouraged to follow-up with her PCP within the next week for recheck.  Return precautions were given.  She was complaining of a knot around where her hip surgery was done and some increased pain to this area.  The incision is well-appearing without signs of infection.  There is a firm area to the proximal area of the wound.  There is no suggestions of infection/abscess.  X-rays were performed which shows no acute abnormalities.  This was interpreted by me and confirmed by the radiologist.  Encouraged her to follow-up with her orthopedic surgeon regarding this.   Final Clinical Impression(s) / ED Diagnoses Final diagnoses:  Vertigo  Lower urinary tract infectious disease    Rx / DC Orders ED Discharge Orders          Ordered    cephALEXin (KEFLEX) 500 MG capsule  2 times daily        09/29/21 2247    meclizine (ANTIVERT) 25 MG tablet  3 times daily PRN        09/29/21  2247              Malvin Johns, MD 09/29/21 2310

## 2021-09-29 NOTE — ED Triage Notes (Signed)
Pt reports dizziness x 1 hr, describes as room spinning, but did resolve; also c/o LLE pain since Thurs after PT

## 2021-10-01 ENCOUNTER — Telehealth: Payer: Self-pay | Admitting: Orthopaedic Surgery

## 2021-10-01 NOTE — Progress Notes (Unsigned)
[facility] [address] [place], [place] [address] [phone]  Date:  10/03/2021   Name:  Stephanie Parks   DOB:  03/27/1945   MRN:  [id number]  PCP:  [name], MD    Chief Complaint: No chief complaint on file.   History of Present Illness:  Stephanie Parks is a 76 y.o. very pleasant female patient who presents with the following:  Patient is seen today for emergency room follow-up Most recent visit with myself was in May of this year-in the interim she had a hip replacement in July History of hypertension, hyperlipidemia, allergic rhinitis, OSA on CPAP. She has tended to have frequent ER visits for chest pain likely related to anxiety She also had CHYIF-02 complicated by pulmonary embolism -She has completed treatment with Eliquis and is no longer using oxygen  She was seen in the ER on 8/12 with concern of vertigo; her evaluation was benign and she was started on Keflex for possible UTI, I do not think a urine culture was done She notes that she has been seeing "spots before my eyes" but not floaters-she describes as more like flashing lights- that will come and go for a week to 10 days Her vision is otherwise normal- Her eye doctor is in [place] Address: [address], [place], [place] [address] Phone: [phone]  She is not having any urinary sx  She has noted numbness of both thighs and the toes on both feet coming and going for about one month She has noted lower back pain for about 6 weeks Back pain also will come and go  She does not note any numbness of her legs except left leg weakness is related to recent hip replacement  History of lumbar decompression- she had an MRI in 6/21 IMPRESSION: L5 is a transitional vertebra, consistent with the numbering terminology used on the previous MRI 03/02/2016. 2 mm retrolisthesis T11-12. 3 mm  retrolisthesis T12-L1. Disc bulges at those levels. No canal stenosis. Foraminal narrowing at T12-L1, right more than left. No significant change since the previous study. Mild degenerative change without compressive pathology at L1-2 or L2-3. L3-4 and L4-5 show previous posterior decompression procedures. Wide patency of the canal at those levels. At L3-4, there is foraminal narrowing on the right because of encroachment by osteophyte and bulging disc material that could possibly affect the right L3 nerve. At L4-5, there is foraminal narrowing on the left due to encroachment by osteophyte and bulging disc material that could possibly affect the exiting left L4 nerve. These findings appear unchanged since 2018.  She had a lumbar decompression in 1998 per DR Cyndy Freeze -otherwise has not recently seen neurosurgery Patient Active Problem List   Diagnosis Date Noted   Status post total replacement of left hip 09/04/2021   DJD (degenerative joint disease) 09/04/2021   Iron deficiency anemia 03/13/2021   Erythropoietin deficiency anemia 03/13/2021   Unilateral primary osteoarthritis, left hip 10/18/2020   Greater trochanteric bursitis of left hip 10/18/2020   Headache 06/07/2020   Acute respiratory failure with hypoxia ([place]) 05/02/2020   Post-COVID chronic dyspnea 05/02/2020   Pulsatile neck mass 04/26/2020   Pneumonia due to COVID-19 virus 03/12/2020   Headache disorder 01/04/2020   Seasonal allergic conjunctivitis 10/04/2019   Dysfunction of right eustachian tube 10/04/2019   Pre-diabetes 06/02/2019   Seasonal and perennial allergic rhinitis 12/22/2018   Heart palpitations  12/22/2018   History of chest pain 07/20/2018   Brain aneurysm 07/20/2018   Dyslipidemia 05/04/2018   Tendonitis, Achilles, right 12/08/2017   Unilateral primary osteoarthritis, left knee 10/07/2017   Ingrown toenail 08/10/2017   Coughing 04/24/2017   CAD (coronary artery disease) 03/01/2015   Aneurysm ([place])  03/01/2015   Dizziness 03/01/2015   Paresthesia of arm 03/01/2015   Achalasia 09/28/2012   CN (constipation) 09/28/2012   Cephalalgia 08/24/2012   Essential hypertension 04/19/2011   Hyperlipidemia 04/19/2011   GERD (gastroesophageal reflux disease) 04/19/2011   Chest pain 04/04/2011    Past Medical History:  Diagnosis Date   Achalasia 09/28/2012   Allergic rhinitis 03/01/2015   Anemia    Aneurysm ([place]) 03/01/2015   stable 5 mm periopthalmic right ICA aneurysm 03/25/21 CTA   BP (high blood pressure) 03/01/2015   Bronchitis 04/23/2018   Cephalalgia 08/24/2012   Overview:  ICD-10 cut over     Chest pain 04/04/2011   Coronary Ca Score 0, no significant CAD 08/20/18; CP with troponin 4>32 s/p LHC showing normal coronaries 12/29/18   CN (constipation) 09/28/2012   Coughing 04/24/2017   Decreased potassium in the blood 03/01/2015   Diverticulosis    Dizziness 03/01/2015   Erythropoietin deficiency anemia 03/13/2021   GERD (gastroesophageal reflux disease)    Hiatal hernia    Hypercholesterolemia 03/01/2015   Hyperlipidemia    Hypertension    Ingrown toenail 08/10/2017   Inguinal hernia    Iron deficiency anemia 03/13/2021   Migraine headache    Onychomycosis 12/08/2017   Paresthesia of arm 03/01/2015   PE (pulmonary thromboembolism) ([place]) 03/13/2020   in setting of + COVID-19, s/p Eliquis x 3 month   Schatzki's ring    Tendonitis, Achilles, right 12/08/2017   Unilateral primary osteoarthritis, left knee 10/07/2017    Past Surgical History:  Procedure Laterality Date   ABDOMINAL HYSTERECTOMY     BACK SURGERY     x 3   CARDIAC CATHETERIZATION  12/29/2018   angiographically normal coronary arteries 12/29/18 ([facility])   CARPAL TUNNEL RELEASE     left wrist   CERVICAL SPINE SURGERY     HEMORRHOID SURGERY     TOTAL HIP ARTHROPLASTY Left 09/04/2021   Procedure: LEFT TOTAL HIP ARTHROPLASTY ANTERIOR APPROACH;  Surgeon: Mcarthur Rossetti, MD;  Location: [facility];   Service: Orthopedics;  Laterality: Left;    Social History   Tobacco Use   Smoking status: Former    Packs/day: 0.50    Years: 20.00    Total pack years: 10.00    Types: Cigarettes    Quit date: 02/19/1984    Years since quitting: 37.6   Smokeless tobacco: Never  Vaping Use   Vaping Use: Never used  Substance Use Topics   Alcohol use: No   Drug use: No    Family History  Problem Relation Age of Onset   Allergic rhinitis Sister    Diabetes Sister    Hypertension Sister    Heart attack Father 65   Heart disease Father    Stomach cancer Maternal Grandmother    Heart disease Mother    Colon cancer Neg Hx    Angioedema Neg Hx    Asthma Neg Hx    Eczema Neg Hx    Urticaria Neg Hx    Immunodeficiency Neg Hx    Breast cancer Neg Hx    Migraines Neg Hx    Headache Neg Hx     Allergies  Allergen Reactions  Shellfish Allergy Hives and Swelling   Ace Inhibitors Other (See Comments)    Pt cannot recall her reaction but tolerates arb    Gabapentin Other (See Comments)    headaches   Pitavastatin     Unknown reaction    Sulfa Antibiotics Other (See Comments) and Rash    Fine bumps   Topiramate Palpitations    Heart race,     Medication list has been reviewed and updated.  Current Outpatient Medications on File Prior to Visit  Medication Sig Dispense Refill   acetaminophen (TYLENOL) 325 MG tablet Take 650 mg by mouth every 6 (six) hours as needed for moderate pain or headache.     aspirin 81 MG chewable tablet Chew 1 tablet (81 mg total) by mouth 2 (two) times daily. 30 tablet 0   Calcium Carb-Cholecalciferol (CALCIUM 600 + D PO) Take 1 tablet by mouth daily.     Camphor-Menthol-Methyl Sal (SALONPAS) 3.02-24-08 % PTCH Place 1 patch onto the skin daily as needed (pain).     carboxymethylcellulose (REFRESH PLUS) 0.5 % SOLN Place 1 drop into both eyes 3 (three) times daily as needed (dry eyes).     cephALEXin (KEFLEX) 500 MG capsule Take 1 capsule (500 mg total) by mouth 2  (two) times daily. 14 capsule 0   ELDERBERRY PO Take 1 capsule by mouth daily.     EPINEPHrine (EPIPEN 2-PAK) 0.3 mg/0.3 mL IJ SOAJ injection Inject 0.3 mg into the muscle as needed for anaphylaxis. Per allergen immunotherapy protocol 1 each 2   fluticasone (FLONASE) 50 MCG/ACT nasal spray Place 2 sprays into both nostrils daily as needed for allergies. 48 g 3   losartan (COZAAR) 100 MG tablet TAKE 1 TABLET BY MOUTH EVERY DAY 90 tablet 3   MAGNESIUM PO Take 1 tablet by mouth daily.     meclizine (ANTIVERT) 25 MG tablet Take 1 tablet (25 mg total) by mouth 3 (three) times daily as needed for dizziness. 15 tablet 0   methocarbamol (ROBAXIN) 500 MG tablet Take 1 tablet (500 mg total) by mouth every 6 (six) hours as needed for muscle spasms. 30 tablet 0   Multiple Vitamins-Minerals (ALIVE MULTI-VITAMIN PO) Take 1 tablet by mouth daily.     NON FORMULARY Pt uses a cpap nightly     nortriptyline (PAMELOR) 25 MG capsule Take 2 capsules (50 mg total) by mouth at bedtime. 180 capsule 3   omeprazole (PRILOSEC) 40 MG capsule Take 40 mg by mouth daily.     oxyCODONE (OXY IR/ROXICODONE) 5 MG immediate release tablet Take 1-2 tablets (5-10 mg total) by mouth every 6 (six) hours as needed for moderate pain (pain score 4-6). 30 tablet 0   potassium chloride SA (KLOR-CON M) 20 MEQ tablet 20-40 mEq See admin instructions. Take 20 mEq by mouth every other day, alternating with 40 mEq 180 tablet 1   rosuvastatin (CRESTOR) 10 MG tablet TAKE 1 TABLET BY MOUTH EVERY DAY 90 tablet 3   spironolactone (ALDACTONE) 25 MG tablet Take 1 tablet (25 mg total) by mouth daily. (Patient taking differently: Take 12.5 mg by mouth daily.) 90 tablet 3   zinc gluconate 50 MG tablet Take 50 mg by mouth daily.     No current facility-administered medications on file prior to visit.    Review of Systems:  As per HPI- otherwise negative.  BP Readings from Last 3 Encounters:  10/03/21 (!) 108/53  09/29/21 (!) 143/76  09/06/21 (!)  131/50    Physical Examination: Vitals:  10/03/21 1305  BP: (!) 108/53  Pulse: 92  Resp: 16  Temp: 98.2 F (36.8 C)  SpO2: 100%   Vitals:   10/03/21 1305  Weight: 217 lb (98.4 kg)  Height: '5\' 5"'$  (1.651 m)   Body mass index is 36.11 kg/m. Ideal Body Weight: Weight in (lb) to have BMI = 25: 149.9  GEN: no acute distress.  Obese, looks well HEENT: Atraumatic, Normocephalic.  Bilateral TM wnl, oropharynx normal.  PEERL,EOMI. limited funduscopic exam normal bilaterally Ears and Nose: No external deformity. CV: RRR, No M/G/R. No JVD. No thrill. No extra heart sounds. PULM: CTA B, no wheezes, crackles, rhonchi. No retractions. No resp. distress. No accessory muscle use. EXTR: No c/c/e PSYCH: Normally interactive. Conversant.  Normal pulses and circulation in both feet. Normal strength, sensation of both lower extremities.  Negative straight leg raise  Assessment and Plan: Vertigo  Hospital discharge follow-up  Vitreous flashes of both eyes  Leg numbness - Plan: CBC, B12 and Folate Panel, Ferritin, CANCELED: Iron  Anemia, unspecified type - Plan: Ferritin  Essential hypertension  Here today with a few concerns.  Stephanie Parks was extensively following up for an ER visit for vertigo.  However today she notes a few other problems.  She has noted flashing lights in both eyes, intermittent for about a week.  I called her optometrist and was able to get her an appointment tomorrow at noon, advised patient it is very important that she attend this appointment  Leg numbness; difficult to ascertain if this is perhaps due to peripheral neuropathy or nerve impingement at the spine level She does have known history of significant lumbar degenerative disease and lumbar decompression Macrocytic anemia; certainly consider O27 or iron deficiency  We plan to start by checking lab work as above.  Can proceed to an MRI if not helpful Blood pressure is borderline low, may be overtreating her  hypertension.  We will have her decrease losartan to 50 mg; she is able to check her blood pressures at home  Will plan further follow- up pending labs.    Signed Lamar Blinks, MD  Received labs as below, message to patient Results for orders placed or performed in visit on 10/03/21  CBC  Result Value Ref Range   WBC 7.0 4.0 - 10.5 K/uL   RBC 3.46 (L) 3.87 - 5.11 Mil/uL   Platelets 333.0 150.0 - 400.0 K/uL   Hemoglobin 10.2 (L) 12.0 - 15.0 g/dL   HCT 31.3 (L) 36.0 - 46.0 %   MCV 90.6 78.0 - 100.0 fl   MCHC 32.7 30.0 - 36.0 g/dL   RDW 14.4 11.5 - 15.5 %  B12 and Folate Panel  Result Value Ref Range   Vitamin B-12 297 211 - 911 pg/mL   Folate >24.2 >5.9 ng/mL  Ferritin  Result Value Ref Range   Ferritin 146.4 10.0 - 291.0 ng/mL

## 2021-10-01 NOTE — Telephone Encounter (Signed)
Patient aware if no redness, swelling, or drainage it is probably scar tissue and to just massage it; if anything changes before her appt this month to call us back

## 2021-10-01 NOTE — Telephone Encounter (Signed)
Pt states she has a note on her incision where she had surgery, Is this okay?   Cb [phone] [id number]

## 2021-10-01 NOTE — Patient Instructions (Incomplete)
It was good to see you again today-I will be in touch with your labs I noticed you were more anemic where you were in the ER- need to make sure this is getting better I made an appt for you to see your eye doctor tomorrow 8/17 at 12:15 pm  [facility]   Address: [address] [place] [address], [place], [place] [address] Phone: [phone]  Please cut your losartan in half- take just 50 mg- and let me know how your BP responds

## 2021-10-02 DIAGNOSIS — G4733 Obstructive sleep apnea (adult) (pediatric): Secondary | ICD-10-CM | POA: Diagnosis not present

## 2021-10-03 ENCOUNTER — Telehealth: Payer: Self-pay | Admitting: Orthopaedic Surgery

## 2021-10-03 ENCOUNTER — Encounter: Payer: Self-pay | Admitting: Family Medicine

## 2021-10-03 ENCOUNTER — Ambulatory Visit (INDEPENDENT_AMBULATORY_CARE_PROVIDER_SITE_OTHER): Payer: Medicare Other | Admitting: Family Medicine

## 2021-10-03 VITALS — BP 108/53 | HR 92 | Temp 98.2°F | Resp 16 | Ht 65.0 in | Wt 217.0 lb

## 2021-10-03 DIAGNOSIS — I2510 Atherosclerotic heart disease of native coronary artery without angina pectoris: Secondary | ICD-10-CM | POA: Diagnosis not present

## 2021-10-03 DIAGNOSIS — D649 Anemia, unspecified: Secondary | ICD-10-CM | POA: Diagnosis not present

## 2021-10-03 DIAGNOSIS — R200 Anesthesia of skin: Secondary | ICD-10-CM

## 2021-10-03 DIAGNOSIS — Z471 Aftercare following joint replacement surgery: Secondary | ICD-10-CM | POA: Diagnosis not present

## 2021-10-03 DIAGNOSIS — Z7982 Long term (current) use of aspirin: Secondary | ICD-10-CM | POA: Diagnosis not present

## 2021-10-03 DIAGNOSIS — R42 Dizziness and giddiness: Secondary | ICD-10-CM | POA: Diagnosis not present

## 2021-10-03 DIAGNOSIS — H5319 Other subjective visual disturbances: Secondary | ICD-10-CM | POA: Diagnosis not present

## 2021-10-03 DIAGNOSIS — D509 Iron deficiency anemia, unspecified: Secondary | ICD-10-CM | POA: Diagnosis not present

## 2021-10-03 DIAGNOSIS — I509 Heart failure, unspecified: Secondary | ICD-10-CM | POA: Diagnosis not present

## 2021-10-03 DIAGNOSIS — K219 Gastro-esophageal reflux disease without esophagitis: Secondary | ICD-10-CM | POA: Diagnosis not present

## 2021-10-03 DIAGNOSIS — Z86711 Personal history of pulmonary embolism: Secondary | ICD-10-CM | POA: Diagnosis not present

## 2021-10-03 DIAGNOSIS — E7800 Pure hypercholesterolemia, unspecified: Secondary | ICD-10-CM | POA: Diagnosis not present

## 2021-10-03 DIAGNOSIS — I10 Essential (primary) hypertension: Secondary | ICD-10-CM | POA: Diagnosis not present

## 2021-10-03 DIAGNOSIS — I729 Aneurysm of unspecified site: Secondary | ICD-10-CM | POA: Diagnosis not present

## 2021-10-03 DIAGNOSIS — Z8616 Personal history of COVID-19: Secondary | ICD-10-CM | POA: Diagnosis not present

## 2021-10-03 DIAGNOSIS — H1010 Acute atopic conjunctivitis, unspecified eye: Secondary | ICD-10-CM | POA: Diagnosis not present

## 2021-10-03 DIAGNOSIS — R7303 Prediabetes: Secondary | ICD-10-CM | POA: Diagnosis not present

## 2021-10-03 DIAGNOSIS — K5790 Diverticulosis of intestine, part unspecified, without perforation or abscess without bleeding: Secondary | ICD-10-CM | POA: Diagnosis not present

## 2021-10-03 DIAGNOSIS — Z96642 Presence of left artificial hip joint: Secondary | ICD-10-CM | POA: Diagnosis not present

## 2021-10-03 DIAGNOSIS — Z09 Encounter for follow-up examination after completed treatment for conditions other than malignant neoplasm: Secondary | ICD-10-CM | POA: Diagnosis not present

## 2021-10-03 DIAGNOSIS — G43909 Migraine, unspecified, not intractable, without status migrainosus: Secondary | ICD-10-CM | POA: Diagnosis not present

## 2021-10-03 DIAGNOSIS — Z87891 Personal history of nicotine dependence: Secondary | ICD-10-CM | POA: Diagnosis not present

## 2021-10-03 LAB — CBC
HCT: 31.3 % — ABNORMAL LOW (ref 36.0–46.0)
Hemoglobin: 10.2 g/dL — ABNORMAL LOW (ref 12.0–15.0)
MCHC: 32.7 g/dL (ref 30.0–36.0)
MCV: 90.6 fl (ref 78.0–100.0)
Platelets: 333 10*3/uL (ref 150.0–400.0)
RBC: 3.46 Mil/uL — ABNORMAL LOW (ref 3.87–5.11)
RDW: 14.4 % (ref 11.5–15.5)
WBC: 7 10*3/uL (ref 4.0–10.5)

## 2021-10-03 LAB — FERRITIN: Ferritin: 146.4 ng/mL (ref 10.0–291.0)

## 2021-10-03 LAB — B12 AND FOLATE PANEL
Folate: >24.2 ng/mL (ref >5.9)
Vitamin B-12: 297 pg/mL (ref 211–911)

## 2021-10-03 NOTE — Telephone Encounter (Signed)
Anderson Malta (PT) from [facility] called with FYI of pt being discharge plan of care. Anderson Malta phone number is [phone].

## 2021-10-04 DIAGNOSIS — H43813 Vitreous degeneration, bilateral: Secondary | ICD-10-CM | POA: Diagnosis not present

## 2021-10-05 NOTE — Progress Notes (Signed)
EXP 10/09/22

## 2021-10-07 DIAGNOSIS — G4733 Obstructive sleep apnea (adult) (pediatric): Secondary | ICD-10-CM | POA: Diagnosis not present

## 2021-10-08 DIAGNOSIS — J3089 Other allergic rhinitis: Secondary | ICD-10-CM

## 2021-10-11 ENCOUNTER — Ambulatory Visit (INDEPENDENT_AMBULATORY_CARE_PROVIDER_SITE_OTHER): Payer: Medicare Other

## 2021-10-11 DIAGNOSIS — J309 Allergic rhinitis, unspecified: Secondary | ICD-10-CM

## 2021-10-12 ENCOUNTER — Telehealth: Payer: Self-pay

## 2021-10-12 NOTE — Telephone Encounter (Signed)
Pt called but is in recovery from surgery- Stephanie Parks

## 2021-10-15 ENCOUNTER — Encounter: Payer: Self-pay | Admitting: Hematology & Oncology

## 2021-10-15 ENCOUNTER — Telehealth: Payer: Self-pay | Admitting: *Deleted

## 2021-10-15 ENCOUNTER — Ambulatory Visit (INDEPENDENT_AMBULATORY_CARE_PROVIDER_SITE_OTHER): Payer: Medicare Other | Admitting: Orthopaedic Surgery

## 2021-10-15 DIAGNOSIS — Z96642 Presence of left artificial hip joint: Secondary | ICD-10-CM

## 2021-10-15 NOTE — Telephone Encounter (Signed)
Ortho bundle in office meeting completed. 

## 2021-10-15 NOTE — Progress Notes (Signed)
The patient is now 6 weeks status post a left total hip arthroplasty.  She is a young appearing 76 year old female.  She is ambulate with a cane.  She reports good range of motion and strength.  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  There is still some stiffness with her left operative hip but overall she is improving with mobility and range of motion.  I did look at her incision.  There is little bit of scar tissue at the superior aspect but no open wounds and it feels good overall.  We will see her back in a month to see how she is doing overall but no x-rays are needed.  At that point I can fill out any paperwork that allows her to go back to work October 10.

## 2021-10-26 ENCOUNTER — Ambulatory Visit (INDEPENDENT_AMBULATORY_CARE_PROVIDER_SITE_OTHER): Payer: Medicare Other | Admitting: *Deleted

## 2021-10-26 DIAGNOSIS — J309 Allergic rhinitis, unspecified: Secondary | ICD-10-CM

## 2021-11-07 DIAGNOSIS — G4733 Obstructive sleep apnea (adult) (pediatric): Secondary | ICD-10-CM | POA: Diagnosis not present

## 2021-11-12 ENCOUNTER — Encounter: Payer: Self-pay | Admitting: Orthopaedic Surgery

## 2021-11-12 ENCOUNTER — Ambulatory Visit (INDEPENDENT_AMBULATORY_CARE_PROVIDER_SITE_OTHER): Payer: Medicare Other | Admitting: Orthopaedic Surgery

## 2021-11-12 ENCOUNTER — Telehealth: Payer: Self-pay | Admitting: *Deleted

## 2021-11-12 DIAGNOSIS — Z96642 Presence of left artificial hip joint: Secondary | ICD-10-CM

## 2021-11-12 MED ORDER — PREGABALIN 75 MG PO CAPS
75.0000 mg | ORAL_CAPSULE | Freq: Two times a day (BID) | ORAL | 1 refills | Status: DC
Start: 2021-11-12 — End: 2021-12-25

## 2021-11-12 NOTE — Progress Notes (Signed)
The patient is now 10 weeks status post a left total hip arthroplasty.  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  I was able to fill out a sheet for her to allow her to return to work without restrictions.  She says her hip is doing great.  She has been having numbness and tingling in both her legs and this is new.  She has tried Neurontin in the past but it gave her headaches.  Left operative hip appears to be moving well.  There is some subjective numbness in her legs bilaterally but no gross deficits in terms of motor and muscle tone.  She is having some right ankle and foot pain but I believe this is related for her compensating as she has been recovering from her hip replacement.  I would like to try some Lyrica 75 mg twice a day to see if this will help with her neurologic symptoms.  I can then see her back in about 3 months to see how she is doing from her hip and obtain a standing AP pelvis and lateral of her left operative hip.  If her numbness and tingling is worsening she should come back and see [place] earlier.

## 2021-11-12 NOTE — Telephone Encounter (Signed)
60 Ortho bundle call.

## 2021-11-15 NOTE — Progress Notes (Signed)
FOLLOW UP Date of Service/Encounter:  11/16/21   Subjective:  Stephanie Parks (DOB: 08/29/1945) is a 76 y.o. female who returns to the [facility] on 11/16/2021 in re-evaluation of the following: for allergic rhinosinusitis, allergic conjunctivitis, and eustachian tube dysfunction. History obtained from: chart review and patient.  For Review, LV was on 04/04/2020 with Althea Charon, FNP seen for routine follow-up.  She had severe COVID-19 infection requiring hospitalization in January 2022 due to pneumonia and pulmonary embolism.  She was on oxygen at 4 L at that visit.  Has a prior smoking history of half pack per day x20 years.  Quit in 1986.  Her spirometry showed severe restriction without bronchodilator response.  She was referred to pulmonary.  She is on AIT receiving 1 vial containing molds, cockroach and weed.  She is currently in the red vial at maintenance dose coming every 2 to 4 weeks.  Per review of her epic chart, she has been in red vial since at least 2017.  Today presents for follow-up. She does complain of feeling like food is getting stuck on occasion, or hard to swallow.  She takes omeprazole 40 mg daily, and feels her reflux is controlled.  She does follow with gastroenterology, but hasn't been seen recently. Otherwise, she feels that her allergies are very well controlled and only uses Flonase on rare occasions.  She has been getting allergy shots for many years and would like to continue.  She is not having any adverse events with her allergy injections.  She does not need any refills at this time.She had no pulmonary complaints today.   Allergies as of 11/16/2021       Reactions   Shellfish Allergy Hives, Swelling   Ace Inhibitors Other (See Comments)   Pt cannot recall her reaction but tolerates arb    Gabapentin Other (See Comments)   headaches   Pitavastatin    Unknown reaction    Sulfa Antibiotics Other (See Comments), Rash   Fine bumps    Topiramate Palpitations   Heart race,         Medication List        Accurate as of November 16, 2021  9:41 AM. If you have any questions, ask your nurse or doctor.          acetaminophen 325 MG tablet Commonly known as: TYLENOL Take 650 mg by mouth every 6 (six) hours as needed for moderate pain or headache.   ALIVE MULTI-VITAMIN PO Take 1 tablet by mouth daily.   Aspirin Low Dose 81 MG chewable tablet Generic drug: aspirin Chew 1 tablet (81 mg total) by mouth 2 (two) times daily.   CALCIUM 600 + D PO Take 1 tablet by mouth daily.   carboxymethylcellulose 0.5 % Soln Commonly known as: REFRESH PLUS Place 1 drop into both eyes 3 (three) times daily as needed (dry eyes).   cephALEXin 500 MG capsule Commonly known as: KEFLEX Take 1 capsule (500 mg total) by mouth 2 (two) times daily.   ELDERBERRY PO Take 1 capsule by mouth daily.   EPINEPHrine 0.3 mg/0.3 mL Soaj injection Commonly known as: EpiPen 2-Pak Inject 0.3 mg into the muscle as needed for anaphylaxis. Per allergen immunotherapy protocol   fluticasone 50 MCG/ACT nasal spray Commonly known as: FLONASE Place 2 sprays into both nostrils daily as needed for allergies.   losartan 100 MG tablet Commonly known as: COZAAR TAKE 1 TABLET BY MOUTH EVERY DAY   MAGNESIUM PO Take  1 tablet by mouth daily.   meclizine 25 MG tablet Commonly known as: ANTIVERT Take 1 tablet (25 mg total) by mouth 3 (three) times daily as needed for dizziness.   methocarbamol 500 MG tablet Commonly known as: ROBAXIN Take 1 tablet (500 mg total) by mouth every 6 (six) hours as needed for muscle spasms.   NON FORMULARY Pt uses a cpap nightly   nortriptyline 25 MG capsule Commonly known as: PAMELOR Take 2 capsules (50 mg total) by mouth at bedtime.   omeprazole 40 MG capsule Commonly known as: PRILOSEC Take 40 mg by mouth daily.   oxyCODONE 5 MG immediate release tablet Commonly known as: Oxy IR/ROXICODONE Take 1-2 tablets  (5-10 mg total) by mouth every 6 (six) hours as needed for moderate pain (pain score 4-6).   potassium chloride SA 20 MEQ tablet Commonly known as: KLOR-CON M 20-40 mEq See admin instructions. Take 20 mEq by mouth every other day, alternating with 40 mEq   pregabalin 75 MG capsule Commonly known as: LYRICA Take 1 capsule (75 mg total) by mouth 2 (two) times daily.   rosuvastatin 10 MG tablet Commonly known as: CRESTOR TAKE 1 TABLET BY MOUTH EVERY DAY   Salonpas 3.02-24-08 % Ptch Generic drug: Camphor-Menthol-Methyl Sal Place 1 patch onto the skin daily as needed (pain).   spironolactone 25 MG tablet Commonly known as: ALDACTONE Take 1 tablet (25 mg total) by mouth daily. What changed: how much to take   zinc gluconate 50 MG tablet Take 50 mg by mouth daily.       Past Medical History:  Diagnosis Date   Achalasia 09/28/2012   Allergic rhinitis 03/01/2015   Anemia    Aneurysm ([place]) 03/01/2015   stable 5 mm periopthalmic right ICA aneurysm 03/25/21 CTA   BP (high blood pressure) 03/01/2015   Bronchitis 04/23/2018   Cephalalgia 08/24/2012   Overview:  ICD-10 cut over     Chest pain 04/04/2011   Coronary Ca Score 0, no significant CAD 08/20/18; CP with troponin 4>32 s/p LHC showing normal coronaries 12/29/18   CN (constipation) 09/28/2012   Coughing 04/24/2017   Decreased potassium in the blood 03/01/2015   Diverticulosis    Dizziness 03/01/2015   Erythropoietin deficiency anemia 03/13/2021   GERD (gastroesophageal reflux disease)    Hiatal hernia    Hypercholesterolemia 03/01/2015   Hyperlipidemia    Hypertension    Ingrown toenail 08/10/2017   Inguinal hernia    Iron deficiency anemia 03/13/2021   Migraine headache    Onychomycosis 12/08/2017   Paresthesia of arm 03/01/2015   PE (pulmonary thromboembolism) ([place]) 03/13/2020   in setting of + COVID-19, s/p Eliquis x 3 month   Schatzki's ring    Tendonitis, Achilles, right 12/08/2017   Unilateral primary  osteoarthritis, left knee 10/07/2017   Past Surgical History:  Procedure Laterality Date   ABDOMINAL HYSTERECTOMY     BACK SURGERY     x 3   CARDIAC CATHETERIZATION  12/29/2018   angiographically normal coronary arteries 12/29/18 ([facility])   CARPAL TUNNEL RELEASE     left wrist   CERVICAL SPINE SURGERY     HEMORRHOID SURGERY     TOTAL HIP ARTHROPLASTY Left 09/04/2021   Procedure: LEFT TOTAL HIP ARTHROPLASTY ANTERIOR APPROACH;  Surgeon: Mcarthur Rossetti, MD;  Location: [facility];  Service: Orthopedics;  Laterality: Left;   Otherwise, there have been no changes to her past medical history, surgical history, family history, or social history.  ROS: All others negative  except as noted per HPI.   Objective:  BP 120/74 (BP Location: Left Arm, Patient Position: Sitting, Cuff Size: Large)   Pulse 97   Temp 97.9 F (36.6 C) (Temporal)   Resp 20   Wt 221 lb (100.2 kg)   SpO2 98%   BMI 36.78 kg/m  Body mass index is 36.78 kg/m. Physical Exam: General Appearance:  Alert, cooperative, no distress, appears stated age  Head:  Normocephalic, without obvious abnormality, atraumatic  Eyes:  Conjunctiva clear, EOM's intact  Nose: Nares normal, hypertrophic turbinates, normal mucosa, no visible anterior polyps, and septum midline  Throat: Lips, tongue normal; teeth and gums normal, normal posterior oropharynx  Neck: Supple, symmetrical  Lungs:   clear to auscultation bilaterally, Respirations unlabored, no coughing  Heart:  regular rate and rhythm and no murmur, Appears well perfused  Extremities: No edema  Skin: Skin color, texture, turgor normal, no rashes or lesions on visualized portions of skin  Neurologic: No gross deficits     Assessment/Plan   Allergic rhinitis Continue to use flonase 1-2 sprays as needed, best when used consistently Can add non-sedating antihstamine if needed-Your options include: Zyrtec (cetirizine) 10 mg, Claritin (loratadine) 10 mg, Xyzal  (levocetirizine) 5 mg or Allegra (fexofenadine) 180 mg daily as needed Continue allergen avoidance measures directed toward dust mite, cockroach, and weed pollen as listed below Continue allergen immunotherapy and have access to an epinephrine autoinjector set. Injection given today.   Allergic conjunctivitis Continue Refresh eyedrops as per your eye doctor  Trouble swallowing Please schedule follow-up with your Gastroenterologist  She has shellfish allergy listed. This was not addressed today. She does carry an Epipen and this was refilled in August 2023.   Follow up in 12 months or sooner if needed  Sigurd Sos, MD  Allergy and [place] [facility] [place]

## 2021-11-16 ENCOUNTER — Encounter: Payer: Self-pay | Admitting: Internal Medicine

## 2021-11-16 ENCOUNTER — Ambulatory Visit: Payer: Medicare Other | Admitting: Internal Medicine

## 2021-11-16 VITALS — BP 120/74 | HR 97 | Temp 97.9°F | Resp 20 | Wt 221.0 lb

## 2021-11-16 DIAGNOSIS — H1010 Acute atopic conjunctivitis, unspecified eye: Secondary | ICD-10-CM

## 2021-11-16 DIAGNOSIS — J309 Allergic rhinitis, unspecified: Secondary | ICD-10-CM | POA: Diagnosis not present

## 2021-11-16 DIAGNOSIS — H1013 Acute atopic conjunctivitis, bilateral: Secondary | ICD-10-CM

## 2021-11-16 NOTE — Patient Instructions (Addendum)
Allergic rhinitis Continue to use flonase 1-2 sprays as needed, best when used consistently Can add non-sedating antihstamine if needed-Your options include: Zyrtec (cetirizine) 10 mg, Claritin (loratadine) 10 mg, Xyzal (levocetirizine) 5 mg or Allegra (fexofenadine) 180 mg daily as needed Continue allergen avoidance measures directed toward dust mite, cockroach, and weed pollen as listed below Continue allergen immunotherapy and have access to an epinephrine autoinjector set. Injection given today.   Allergic conjunctivitis Continue Refresh eyedrops as per your eye doctor  Trouble swallowing Please schedule follow-up with your Gastroenterologist  Follow up in 12 months or sooner if needed   It was wonderful meeting you today! Thank you for letting me participate in your care. Sigurd Sos, MD [facility]

## 2021-11-30 ENCOUNTER — Ambulatory Visit (INDEPENDENT_AMBULATORY_CARE_PROVIDER_SITE_OTHER): Payer: Medicare Other

## 2021-11-30 DIAGNOSIS — J309 Allergic rhinitis, unspecified: Secondary | ICD-10-CM

## 2021-12-06 ENCOUNTER — Telehealth: Payer: Self-pay | Admitting: *Deleted

## 2021-12-06 NOTE — Telephone Encounter (Signed)
Ortho bundle 90 day call completed. 

## 2021-12-07 DIAGNOSIS — G4733 Obstructive sleep apnea (adult) (pediatric): Secondary | ICD-10-CM | POA: Diagnosis not present

## 2021-12-14 ENCOUNTER — Ambulatory Visit (INDEPENDENT_AMBULATORY_CARE_PROVIDER_SITE_OTHER): Payer: Medicare Other

## 2021-12-14 DIAGNOSIS — J309 Allergic rhinitis, unspecified: Secondary | ICD-10-CM | POA: Diagnosis not present

## 2021-12-19 ENCOUNTER — Ambulatory Visit (INDEPENDENT_AMBULATORY_CARE_PROVIDER_SITE_OTHER): Payer: Medicare Other

## 2021-12-19 ENCOUNTER — Ambulatory Visit: Payer: Medicare Other | Admitting: Orthopaedic Surgery

## 2021-12-19 DIAGNOSIS — M79671 Pain in right foot: Secondary | ICD-10-CM

## 2021-12-19 DIAGNOSIS — M76821 Posterior tibial tendinitis, right leg: Secondary | ICD-10-CM | POA: Diagnosis not present

## 2021-12-19 MED ORDER — DICLOFENAC SODIUM 75 MG PO TBEC
75.0000 mg | DELAYED_RELEASE_TABLET | Freq: Two times a day (BID) | ORAL | 1 refills | Status: DC | PRN
Start: 2021-12-19 — End: 2021-12-25

## 2021-12-19 NOTE — Progress Notes (Signed)
The patient comes today with 3 weeks worth of pain involving her right foot and ankle.  She points to the heel and the medial aspect of her ankle as a source of her pain and some of the Achilles tendon.  She has a history of a hip replacement that were performed of her left hip in July of this year.  She says the hip is doing well.  Examination her left foot and ankle shows her dorsiflexion and plantarflexion are full.  Her Achilles is intact with a negative Thompson exam.  Her pain is somewhat over the plantar fascia but is more over the posterior tibial tendon and the Achilles tendon.  On exam she cannot perform a toe raise on the right side but she can on the left side.  This does show some slight dysfunction of the posterior tibial tendon.  X-rays of the right foot show some calcifications around the Achilles tendon near its insertion but otherwise no acute findings.  I would like to try diclofenac as an anti-inflammatory for her as well as a cam walking boot.  I did offer physical therapy as well but she defers PT preferring to wait to see how she does with the boot in the diclofenac.  All questions concerns were answered addressed.  We will see her back in 3 weeks for repeat clinical exam.

## 2021-12-24 NOTE — Progress Notes (Unsigned)
Cardiology Office Note   Date:  12/25/2021   ID:  Stephanie, Parks August 23, 1945, MRN [id number]  PCP:  [name], MD    No chief complaint on file.  HTN  Wt Readings from Last 3 Encounters:  12/25/21 224 lb 12.8 oz (102 kg)  11/16/21 221 lb (100.2 kg)  10/03/21 217 lb (98.4 kg)       History of Present Illness: Stephanie Parks is a 76 y.o. female  has had HTN and hyperlipidemia.  She has a h/o brain aneurysm.  She had a cath many years ago and no PCI was needed.     She has had palpitations, monitor in 2018 showed: Normal sinus rhythm with occasional PACs and PVCs. Occasional sinus tachycardia noted. Patient's symptoms of fluttering did not correspond to an arrhythmia.   SHe has had atypical chest discomfort in the past.  She declined stress test as symptoms were mild and quite atypical including improving with exercise and belching.   Lovastatin was switched for Crestor in 2020.    12/2018 echo essentially normal at [facility].   Cath in 2020 at [facility]: "Advance and coronary angiogram via R CFA (accessed R radial but severe  tortuosity of R SCA and unable to wire into Ao so transitioned to femoral)  for chest pain (suspect the positive troponin was lab error).  LM engaged,  normal.  LAD normal, LCx normal.  RCA engaged, normal.  Angiogram of  innominate performed, no complication from attempted wiring via radial,  severe tortuosity confirmed; " LVEDP 20 mm Hg.  11/2019 monitor showed: "Normal sinus rhythm with rare PACs and PVCs, none of which correlated to symptoms. No pathologic arrhythmias. No atrial fibrillation."   Main stress is working at [employer]. She is frustrated that everything has been ok on several ER trips.    She has made several trips to the urgent care with negative w/us both at [place].  She continues to have chest pain. She reports left arm pain and left leg pain.  She noted that the pain is reduced with belching. She has  an appt with pulmonary.     Admits to getting panicked with seeing high BP readings.    In 04/2020: "We spoke about deep breathing and relaxation techniques as well.  I think anxiety plays a large role in her symptoms.  Will defer to PCP to see if SSRI would be helpful."   She wanted to have left hip replacement in November 2022 with Dr. Ninfa Linden.  Her blood pressure needed better controlled to have surgery.  She was seen in our Pharm.D. hypertension clinic several times.  She ultimately had her left hip surgery in the summer 2023. Ultimately, blood pressure was controlled on losartan 100 mg daily and spironolactone 25 mg daily.  Although, her heart rate was somewhat elevated, she declined medication.  She has had increased HR for the past few months.  She was concerned about meds to slow HR since her mother passed away on such a medicine.   Mild anemia in 8/23.    Now with some right ankle pain and swelling.   Denies : Chest pain. Dizziness. Leg edema. Nitroglycerin use. Orthopnea. Paroxysmal nocturnal dyspnea. Shortness of breath. Syncope.    She has increased her walking.     Past Medical History:  Diagnosis Date   Achalasia 09/28/2012   Allergic rhinitis 03/01/2015   Anemia    Aneurysm ([place]) 03/01/2015   stable 5  mm periopthalmic right ICA aneurysm 03/25/21 CTA   BP (high blood pressure) 03/01/2015   Bronchitis 04/23/2018   Cephalalgia 08/24/2012   Overview:  ICD-10 cut over     Chest pain 04/04/2011   Coronary Ca Score 0, no significant CAD 08/20/18; CP with troponin 4>32 s/p LHC showing normal coronaries 12/29/18   CN (constipation) 09/28/2012   Coughing 04/24/2017   Decreased potassium in the blood 03/01/2015   Diverticulosis    Dizziness 03/01/2015   Erythropoietin deficiency anemia 03/13/2021   GERD (gastroesophageal reflux disease)    Hiatal hernia    Hypercholesterolemia 03/01/2015   Hyperlipidemia    Hypertension    Ingrown toenail 08/10/2017   Inguinal hernia     Iron deficiency anemia 03/13/2021   Migraine headache    Onychomycosis 12/08/2017   Paresthesia of arm 03/01/2015   PE (pulmonary thromboembolism) ([place]) 03/13/2020   in setting of + COVID-19, s/p Eliquis x 3 month   Schatzki's ring    Tendonitis, Achilles, right 12/08/2017   Unilateral primary osteoarthritis, left knee 10/07/2017    Past Surgical History:  Procedure Laterality Date   ABDOMINAL HYSTERECTOMY     BACK SURGERY     x 3   CARDIAC CATHETERIZATION  12/29/2018   angiographically normal coronary arteries 12/29/18 ([place])   CARPAL TUNNEL RELEASE     left wrist   CERVICAL SPINE SURGERY     HEMORRHOID SURGERY     TOTAL HIP ARTHROPLASTY Left 09/04/2021   Procedure: LEFT TOTAL HIP ARTHROPLASTY ANTERIOR APPROACH;  Surgeon: Mcarthur Rossetti, MD;  Location: [facility];  Service: Orthopedics;  Laterality: Left;     Current Outpatient Medications  Medication Sig Dispense Refill   acetaminophen (TYLENOL) 325 MG tablet Take 650 mg by mouth every 6 (six) hours as needed for moderate pain or headache.     aspirin 81 MG chewable tablet Chew 1 tablet (81 mg total) by mouth 2 (two) times daily. 30 tablet 0   Calcium Carb-Cholecalciferol (CALCIUM 600 + D PO) Take 1 tablet by mouth daily.     carboxymethylcellulose (REFRESH PLUS) 0.5 % SOLN Place 1 drop into both eyes 3 (three) times daily as needed (dry eyes).     ELDERBERRY PO Take 1 capsule by mouth daily.     EPINEPHrine (EPIPEN 2-PAK) 0.3 mg/0.3 mL IJ SOAJ injection Inject 0.3 mg into the muscle as needed for anaphylaxis. Per allergen immunotherapy protocol 1 each 2   fluticasone (FLONASE) 50 MCG/ACT nasal spray Place 2 sprays into both nostrils daily as needed for allergies. 48 g 3   losartan (COZAAR) 100 MG tablet Take 50 mg by mouth daily. Pt takes 50 mg 1/2 tablet once a day.     MAGNESIUM PO Take 1 tablet by mouth daily.     Multiple Vitamins-Minerals (ALIVE MULTI-VITAMIN PO) Take 1 tablet by mouth daily.     NON FORMULARY  Pt uses a cpap nightly     nortriptyline (PAMELOR) 25 MG capsule Take 2 capsules (50 mg total) by mouth at bedtime. 180 capsule 3   omeprazole (PRILOSEC) 40 MG capsule Take 40 mg by mouth daily.     potassium chloride SA (KLOR-CON M) 20 MEQ tablet 20-40 mEq See admin instructions. Take 20 mEq by mouth every other day, alternating with 40 mEq 180 tablet 1   rosuvastatin (CRESTOR) 10 MG tablet TAKE 1 TABLET BY MOUTH EVERY DAY 90 tablet 3   spironolactone (ALDACTONE) 25 MG tablet Take 12.5 mg by mouth daily. Pt takes  1/2 tablet 12.5 mg daily.     zinc gluconate 50 MG tablet Take 50 mg by mouth daily.     No current facility-administered medications for this visit.    Allergies:   Shellfish allergy, Ace inhibitors, Gabapentin, Pitavastatin, Sulfa antibiotics, and Topiramate    Social History:  The patient  reports that she quit smoking about 37 years ago. Her smoking use included cigarettes. She has a 10.00 pack-year smoking history. She has never used smokeless tobacco. She reports that she does not drink alcohol and does not use drugs.   Family History:  The patient's family history includes Allergic rhinitis in her sister; Diabetes in her sister; Heart attack (age of onset: 25) in her father; Heart disease in her father and mother; Hypertension in her sister; Stomach cancer in her maternal grandmother.    ROS:  Please see the history of present illness.   Otherwise, review of systems are positive for .   All other systems are reviewed and negative.    PHYSICAL EXAM: VS:  BP 106/60   Pulse 98   Ht '5\' 5"'$  (1.651 m)   Wt 224 lb 12.8 oz (102 kg)   SpO2 99%   BMI 37.41 kg/m  , BMI Body mass index is 37.41 kg/m. GEN: Well nourished, well developed, in no acute distress HEENT: normal Neck: no JVD, carotid bruits, or masses Cardiac: RRR; no murmurs, rubs, or gallops,no edema  Respiratory:  clear to auscultation bilaterally, normal work of breathing GI: soft, nontender, nondistended, +  BS MS: no deformity or atrophy Skin: warm and dry, no rash Neuro:  Strength and sensation are intact Psych: euthymic mood, full affect   EKG:   The ekg ordered today demonstrates    Recent Labs: 06/20/2021: Pro B Natriuretic peptide (BNP) 28.0; TSH 2.60 09/29/2021: ALT 10; BUN 12; Creatinine, Ser 0.94; Potassium 4.0; Sodium 138 10/03/2021: Hemoglobin 10.2; Platelets 333.0   Lipid Panel    Component Value Date/Time   CHOL 161 06/02/2019 1439   CHOL 176 07/28/2018 0756   TRIG 98 03/12/2020 1503   HDL 56.00 06/02/2019 1439   HDL 73 07/28/2018 0756   CHOLHDL 3 06/02/2019 1439   VLDL 17.2 06/02/2019 1439   LDLCALC 87 06/02/2019 1439   LDLCALC 93 07/28/2018 0756     Other studies Reviewed: Additional studies/ records that were reviewed today with results demonstrating: labs reviewed; Hbg 10.2l echo reviewed; LDL 87 in 4/21.  Normal LFT in 2023   ASSESSMENT AND PLAN:  HTN: The current medical regimen is effective;  continue present plan and medications.  Blood pressure is now much better controlled on losartan 50 mg daily and spironolactone 12.5 mg daily.  Will check electrolytes today. Palpitations: Noted in the past.  Negative monitor.  She has blamed metoprolol for palpitations in the past.  She did have some likely blood loss anemia after hip surgery.  Check CBC today.  We will also check TSH given the palpitations she has felt.  Also with some mild dyspnea on exertion.  Echo showed normal LV function in February 2023.  Hopefully, this will improve with increased activity.  Her activity has been quite limited for some time prior to her hip surgery. Chest pain she is chest pain for years.  Negative cath in 2020.  Of note, unable to have cath from right radial approach due to subclavian tortuosity. Hyperlipidemia: LDL 87.  Continue rosuvastatin.   Lower extremity edema:  Elevate legs. Known varicose veins in her legs.  Current medicines are reviewed at length with the patient  today.  The patient concerns regarding her medicines were addressed.  The following changes have been made:  No change  Labs/ tests ordered today include: as above No orders of the defined types were placed in this encounter.   Recommend 150 minutes/week of aerobic exercise Low fat, low carb, high fiber diet recommended  Disposition:   FU in 1 year   Signed, Larae Grooms, MD  12/25/2021 9:43 AM    [facility] [address], [place], [place]  [address] Phone: [phone]; Fax: [phone]

## 2021-12-25 ENCOUNTER — Ambulatory Visit: Payer: Medicare Other | Attending: Interventional Cardiology | Admitting: Interventional Cardiology

## 2021-12-25 ENCOUNTER — Encounter: Payer: Self-pay | Admitting: Interventional Cardiology

## 2021-12-25 VITALS — BP 112/62 | HR 98 | Ht 65.0 in | Wt 224.8 lb

## 2021-12-25 DIAGNOSIS — R002 Palpitations: Secondary | ICD-10-CM

## 2021-12-25 DIAGNOSIS — I10 Essential (primary) hypertension: Secondary | ICD-10-CM

## 2021-12-25 DIAGNOSIS — E782 Mixed hyperlipidemia: Secondary | ICD-10-CM

## 2021-12-25 DIAGNOSIS — R600 Localized edema: Secondary | ICD-10-CM | POA: Diagnosis not present

## 2021-12-25 NOTE — Patient Instructions (Signed)
Medication Instructions:  Your physician recommends that you continue on your current medications as directed. Please refer to the Current Medication list given to you today.  *If you need a refill on your cardiac medications before your next appointment, please call your pharmacy*   Lab Work: Lab work to be done today--BMP, CBC, TSH If you have labs (blood work) drawn today and your tests are completely normal, you will receive your results only by: [place] (if you have MyChart) OR A paper copy in the mail If you have any lab test that is abnormal or we need to change your treatment, we will call you to review the results.   Testing/Procedures: none   Follow-Up: At [facility], you and your health needs are our priority.  As part of our continuing mission to provide you with exceptional heart care, we have created designated Provider Care Teams.  These Care Teams include your primary Cardiologist (physician) and Advanced Practice Providers (APPs -  Physician Assistants and Nurse Practitioners) who all work together to provide you with the care you need, when you need it.  We recommend signing up for the patient portal called "MyChart".  Sign up information is provided on this After Visit Summary.  MyChart is used to connect with patients for Virtual Visits (Telemedicine).  Patients are able to view lab/test results, encounter notes, upcoming appointments, etc.  Non-urgent messages can be sent to your provider as well.   To learn more about what you can do with MyChart, go to [email or web address].    Your next appointment:   12 month(s)  The format for your next appointment:   In Person  Provider:   Larae Grooms, MD     Other Instructions    Important Information About Sugar

## 2021-12-26 LAB — CBC
Hematocrit: 34 % (ref 34.0–46.6)
Hemoglobin: 11.2 g/dL (ref 11.1–15.9)
MCH: 29.8 pg (ref 26.6–33.0)
MCHC: 32.9 g/dL (ref 31.5–35.7)
MCV: 90 fL (ref 79–97)
Platelets: 283 10*3/uL (ref 150–450)
RBC: 3.76 x10E6/uL — ABNORMAL LOW (ref 3.77–5.28)
RDW: 13.5 % (ref 11.7–15.4)
WBC: 6.2 10*3/uL (ref 3.4–10.8)

## 2021-12-26 LAB — BASIC METABOLIC PANEL
BUN/Creatinine Ratio: 18 (ref 12–28)
BUN: 16 mg/dL (ref 8–27)
CO2: 29 mmol/L (ref 20–29)
Calcium: 9.5 mg/dL (ref 8.7–10.3)
Chloride: 104 mmol/L (ref 96–106)
Creatinine, Ser: 0.88 mg/dL (ref 0.57–1.00)
Glucose: 111 mg/dL — ABNORMAL HIGH (ref 70–99)
Potassium: 4.1 mmol/L (ref 3.5–5.2)
Sodium: 142 mmol/L (ref 134–144)
eGFR: 68 mL/min/{1.73_m2} (ref >59)

## 2021-12-26 LAB — TSH: TSH: 2.52 u[IU]/mL (ref 0.450–4.500)

## 2021-12-27 ENCOUNTER — Telehealth: Payer: Self-pay | Admitting: Orthopaedic Surgery

## 2021-12-27 NOTE — Telephone Encounter (Signed)
Pt states she needs a CD x-ray of her foot..please call [phone]

## 2021-12-31 ENCOUNTER — Ambulatory Visit (INDEPENDENT_AMBULATORY_CARE_PROVIDER_SITE_OTHER): Payer: Medicare Other

## 2021-12-31 ENCOUNTER — Ambulatory Visit: Payer: Medicare Other | Admitting: Podiatry

## 2021-12-31 DIAGNOSIS — M76821 Posterior tibial tendinitis, right leg: Secondary | ICD-10-CM

## 2021-12-31 DIAGNOSIS — M722 Plantar fascial fibromatosis: Secondary | ICD-10-CM

## 2021-12-31 DIAGNOSIS — M79671 Pain in right foot: Secondary | ICD-10-CM | POA: Diagnosis not present

## 2021-12-31 MED ORDER — METHYLPREDNISOLONE 4 MG PO TBPK: ORAL_TABLET | ORAL | 0 refills | Status: DC

## 2021-12-31 NOTE — Patient Instructions (Signed)
For instructions on how to put on your Tri-Lock Ankle Brace, please visit [email or web address]    Plantar Fasciitis (Heel Spur Syndrome) with Rehab The plantar fascia is a fibrous, ligament-like, soft-tissue structure that spans the bottom of the foot. Plantar fasciitis is a condition that causes pain in the foot due to inflammation of the tissue. SYMPTOMS  Pain and tenderness on the underneath side of the foot. Pain that worsens with standing or walking. CAUSES  Plantar fasciitis is caused by irritation and injury to the plantar fascia on the underneath side of the foot. Common mechanisms of injury include: Direct trauma to bottom of the foot. Damage to a small nerve that runs under the foot where the main fascia attaches to the heel bone. Stress placed on the plantar fascia due to bone spurs. RISK INCREASES WITH:  Activities that place stress on the plantar fascia (running, jumping, pivoting, or cutting). Poor strength and flexibility. Improperly fitted shoes. Tight calf muscles. Flat feet. Failure to warm-up properly before activity. Obesity. PREVENTION Warm up and stretch properly before activity. Allow for adequate recovery between workouts. Maintain physical fitness: Strength, flexibility, and endurance. Cardiovascular fitness. Maintain a health body weight. Avoid stress on the plantar fascia. Wear properly fitted shoes, including arch supports for individuals who have flat feet.  PROGNOSIS  If treated properly, then the symptoms of plantar fasciitis usually resolve without surgery. However, occasionally surgery is necessary.  RELATED COMPLICATIONS  Recurrent symptoms that may result in a chronic condition. Problems of the lower back that are caused by compensating for the injury, such as limping. Pain or weakness of the foot during push-off following surgery. Chronic inflammation, scarring, and partial or complete fascia tear, occurring more often from repeated  injections.  TREATMENT  Treatment initially involves the use of ice and medication to help reduce pain and inflammation. The use of strengthening and stretching exercises may help reduce pain with activity, especially stretches of the Achilles tendon. These exercises may be performed at home or with a therapist. Your caregiver may recommend that you use heel cups of arch supports to help reduce stress on the plantar fascia. Occasionally, corticosteroid injections are given to reduce inflammation. If symptoms persist for greater than 6 months despite non-surgical (conservative), then surgery may be recommended.   MEDICATION  If pain medication is necessary, then nonsteroidal anti-inflammatory medications, such as aspirin and ibuprofen, or other minor pain relievers, such as acetaminophen, are often recommended. Do not take pain medication within 7 days before surgery. Prescription pain relievers may be given if deemed necessary by your caregiver. Use only as directed and only as much as you need. Corticosteroid injections may be given by your caregiver. These injections should be reserved for the most serious cases, because they may only be given a certain number of times.  HEAT AND COLD Cold treatment (icing) relieves pain and reduces inflammation. Cold treatment should be applied for 10 to 15 minutes every 2 to 3 hours for inflammation and pain and immediately after any activity that aggravates your symptoms. Use ice packs or massage the area with a piece of ice (ice massage). Heat treatment may be used prior to performing the stretching and strengthening activities prescribed by your caregiver, physical therapist, or athletic trainer. Use a heat pack or soak the injury in warm water.  SEEK IMMEDIATE MEDICAL CARE IF: Treatment seems to offer no benefit, or the condition worsens. Any medications produce adverse side effects.  EXERCISES- RANGE OF MOTION (ROM) AND STRETCHING EXERCISES - Plantar  IF:  Treatment seems to offer no benefit, or the condition worsens.  Any medications produce adverse side effects.  EXERCISES- RANGE OF  MOTION (ROM) AND STRETCHING EXERCISES - Plantar Fasciitis (Heel Spur Syndrome) These exercises may help you when beginning to rehabilitate your injury. Your symptoms may resolve with or without further involvement from your physician, physical therapist or athletic trainer. While completing these exercises, remember:   Restoring tissue flexibility helps normal motion to return to the joints. This allows healthier, less painful movement and activity.  An effective stretch should be held for at least 30 seconds.  A stretch should never be painful. You should only feel a gentle lengthening or release in the stretched tissue.  RANGE OF MOTION - Toe Extension, Flexion  Sit with your right / left leg crossed over your opposite knee.  Grasp your toes and gently pull them back toward the top of your foot. You should feel a stretch on the bottom of your toes and/or foot.  Hold this stretch for 10 seconds.  Now, gently pull your toes toward the bottom of your foot. You should feel a stretch on the top of your toes and or foot.  Hold this stretch for 10 seconds. Repeat  times. Complete this stretch 3 times per day.   RANGE OF MOTION - Ankle Dorsiflexion, Active Assisted  Remove shoes and sit on a chair that is preferably not on a carpeted surface.  Place right / left foot under knee. Extend your opposite leg for support.  Keeping your heel down, slide your right / left foot back toward the chair until you feel a stretch at your ankle or calf. If you do not feel a stretch, slide your bottom forward to the edge of the chair, while still keeping your heel down.  Hold this stretch for 10 seconds. Repeat 3 times. Complete this stretch 2 times per day.   STRETCH  Gastroc, Standing  Place hands on wall.  Extend right / left leg, keeping the front knee somewhat bent.  Slightly point your toes inward on your back foot.  Keeping your right / left heel on the floor and your knee straight, shift  your weight toward the wall, not allowing your back to arch.  You should feel a gentle stretch in the right / left calf. Hold this position for 10 seconds. Repeat 3 times. Complete this stretch 2 times per day.  STRETCH  Soleus, Standing  Place hands on wall.  Extend right / left leg, keeping the other knee somewhat bent.  Slightly point your toes inward on your back foot.  Keep your right / left heel on the floor, bend your back knee, and slightly shift your weight over the back leg so that you feel a gentle stretch deep in your back calf.  Hold this position for 10 seconds. Repeat 3 times. Complete this stretch 2 times per day.  STRETCH  Gastrocsoleus, Standing  Note: This exercise can place a lot of stress on your foot and ankle. Please complete this exercise only if specifically instructed by your caregiver.   Place the ball of your right / left foot on a step, keeping your other foot firmly on the same step.  Hold on to the wall or a rail for balance.  Slowly lift your other foot, allowing your body weight to press your heel down over the edge of the step.  You should feel a stretch in your right / left calf.  Hold this position   for 10 seconds.  Repeat this exercise with a slight bend in your right / left knee. Repeat 3 times. Complete this stretch 2 times per day.   STRENGTHENING EXERCISES - Plantar Fasciitis (Heel Spur Syndrome)  These exercises may help you when beginning to rehabilitate your injury. They may resolve your symptoms with or without further involvement from your physician, physical therapist or athletic trainer. While completing these exercises, remember:   Muscles can gain both the endurance and the strength needed for everyday activities through controlled exercises.  Complete these exercises as instructed by your physician, physical therapist or athletic trainer. Progress the resistance and repetitions only as guided.  STRENGTH - Towel Curls  Sit in  a chair positioned on a non-carpeted surface.  Place your foot on a towel, keeping your heel on the floor.  Pull the towel toward your heel by only curling your toes. Keep your heel on the floor. Repeat 3 times. Complete this exercise 2 times per day.  STRENGTH - Ankle Inversion  Secure one end of a rubber exercise band/tubing to a fixed object (table, pole). Loop the other end around your foot just before your toes.  Place your fists between your knees. This will focus your strengthening at your ankle.  Slowly, pull your big toe up and in, making sure the band/tubing is positioned to resist the entire motion.  Hold this position for 10 seconds.  Have your muscles resist the band/tubing as it slowly pulls your foot back to the starting position. Repeat 3 times. Complete this exercises 2 times per day.  Document Released: 02/04/2005 Document Revised: 04/29/2011 Document Reviewed: 05/19/2008 [employer] Patient Information 2014 [employer].  

## 2021-12-31 NOTE — Progress Notes (Unsigned)
Subjective:   Patient ID: Stephanie Parks, female   DOB: 76 y.o.   MRN: [id number]   HPI Chief Complaint  Patient presents with   Foot Pain    Right foot and ankle pain, see by ortho Nov 1, Also given a cam boot,pain started a month ago, medial side of the foot top of the ankle Rate of pain 8 out of 10    She states the foot started to hurt all of a sudden. She thinks it may have come from compensation before having her hip replacement. No injury. She saw Dr. Ninfa Linden and had x-rays were done and she was placed in a boot but she was not able to wear it. She said the boot was causing more pain due to the height of it.   Also has concerns of nail fungus.    ROS      Objective:  Physical Exam  General: AAO x3, NAD  Dermatological: Skin is warm, dry and supple bilateral. Nails x 10 are well manicured; remaining integument appears unremarkable at this time. There are no open sores, no preulcerative lesions, no rash or signs of infection present.  Vascular: Dorsalis Pedis artery and Posterior Tibial artery pedal pulses are 2/4 bilateral with immedate capillary fill time. Pedal hair growth present. No varicosities and no lower extremity edema present bilateral. There is no pain with calf compression, swelling, warmth, erythema.   Neruologic: Grossly intact via light touch bilateral. Vibratory intact via tuning fork bilateral. Protective threshold with Semmes Wienstein monofilament intact to all pedal sites bilateral. Patellar and Achilles deep tendon reflexes 2+ bilateral. No Babinski or clonus noted bilateral.   Musculoskeletal: unable to do single heel rise. Flatfoot present .  Gait: Unassisted, Nonantalgic.       Assessment:  ***     Plan:  ***

## 2022-01-04 ENCOUNTER — Ambulatory Visit (INDEPENDENT_AMBULATORY_CARE_PROVIDER_SITE_OTHER): Payer: Medicare Other

## 2022-01-04 DIAGNOSIS — J309 Allergic rhinitis, unspecified: Secondary | ICD-10-CM

## 2022-01-07 DIAGNOSIS — G4733 Obstructive sleep apnea (adult) (pediatric): Secondary | ICD-10-CM | POA: Diagnosis not present

## 2022-01-09 ENCOUNTER — Ambulatory Visit: Payer: Medicare Other | Admitting: Orthopaedic Surgery

## 2022-01-09 ENCOUNTER — Ambulatory Visit (INDEPENDENT_AMBULATORY_CARE_PROVIDER_SITE_OTHER): Payer: Medicare Other | Admitting: *Deleted

## 2022-01-09 DIAGNOSIS — J309 Allergic rhinitis, unspecified: Secondary | ICD-10-CM

## 2022-01-10 ENCOUNTER — Other Ambulatory Visit: Payer: Self-pay | Admitting: Family Medicine

## 2022-01-10 DIAGNOSIS — E785 Hyperlipidemia, unspecified: Secondary | ICD-10-CM

## 2022-01-11 ENCOUNTER — Other Ambulatory Visit: Payer: Self-pay | Admitting: Family Medicine

## 2022-01-11 DIAGNOSIS — E876 Hypokalemia: Secondary | ICD-10-CM

## 2022-01-14 ENCOUNTER — Other Ambulatory Visit: Payer: Self-pay | Admitting: Podiatry

## 2022-01-14 DIAGNOSIS — M722 Plantar fascial fibromatosis: Secondary | ICD-10-CM

## 2022-01-14 DIAGNOSIS — M76821 Posterior tibial tendinitis, right leg: Secondary | ICD-10-CM

## 2022-01-14 DIAGNOSIS — M79671 Pain in right foot: Secondary | ICD-10-CM

## 2022-01-17 ENCOUNTER — Ambulatory Visit (INDEPENDENT_AMBULATORY_CARE_PROVIDER_SITE_OTHER): Payer: Medicare Other

## 2022-01-17 DIAGNOSIS — J309 Allergic rhinitis, unspecified: Secondary | ICD-10-CM | POA: Diagnosis not present

## 2022-01-29 ENCOUNTER — Telehealth: Payer: Self-pay

## 2022-01-31 ENCOUNTER — Other Ambulatory Visit: Payer: Self-pay | Admitting: Podiatry

## 2022-01-31 MED ORDER — DICLOFENAC SODIUM 75 MG PO TBEC: 75.0000 mg | DELAYED_RELEASE_TABLET | Freq: Two times a day (BID) | ORAL | 0 refills | Status: DC

## 2022-02-01 NOTE — Telephone Encounter (Signed)
No further evaluation is needed. 

## 2022-02-03 NOTE — Progress Notes (Unsigned)
[place] at [facility] [address] [place], [place] [address] [phone] [id number]  Date:  02/06/2022   Name:  Stephanie Parks   DOB:  1945-11-13   MRN:  [id number]  PCP:  [name], MD    Chief Complaint: No chief complaint on file.   History of Present Illness:  Stephanie Parks is a 76 y.o. very pleasant female patient who presents with the following:  Patient seen today with concern of a stomach problem Most recent visit with myself was in August History of hypertension, hyperlipidemia, allergic rhinitis, OSA on CPAP. She has tended to have frequent ER visits for chest pain likely related to anxiety She also had [id number] complicated by pulmonary embolism -She has completed treatment with Eliquis and is no longer using oxygen.  Lumbar decompression 1998  Recommend tetanus Recommend Shingrix Colon cancer screening may be due most recently completed in 2020 Flu vaccine  Spironolactone 12.5 Crestor 10 Prilosec Nortriptyline 50 at bedtime Losartan 50 Potassium 40 mill equivalents daily Patient Active Problem List   Diagnosis Date Noted   Status post total replacement of left hip 09/04/2021   DJD (degenerative joint disease) 09/04/2021   Iron deficiency anemia 03/13/2021   Erythropoietin deficiency anemia 03/13/2021   Unilateral primary osteoarthritis, left hip 10/18/2020   Greater trochanteric bursitis of left hip 10/18/2020   Headache 06/07/2020   Acute respiratory failure with hypoxia ([place]) 05/02/2020   Post-COVID chronic dyspnea 05/02/2020   Pulsatile neck mass 04/26/2020   Pneumonia due to COVID-19 virus 03/12/2020   Headache disorder 01/04/2020   Seasonal allergic conjunctivitis 10/04/2019   Dysfunction of right eustachian tube 10/04/2019   Pre-diabetes 06/02/2019   Seasonal and perennial allergic rhinitis 12/22/2018   Heart palpitations 12/22/2018   History of chest pain 07/20/2018   Brain aneurysm  07/20/2018   Dyslipidemia 05/04/2018   Tendonitis, Achilles, right 12/08/2017   Unilateral primary osteoarthritis, left knee 10/07/2017   Ingrown toenail 08/10/2017   Coughing 04/24/2017   CAD (coronary artery disease) 03/01/2015   Aneurysm ([place]) 03/01/2015   Dizziness 03/01/2015   Paresthesia of arm 03/01/2015   Achalasia 09/28/2012   CN (constipation) 09/28/2012   Cephalalgia 08/24/2012   Essential hypertension 04/19/2011   Hyperlipidemia 04/19/2011   GERD (gastroesophageal reflux disease) 04/19/2011   Chest pain 04/04/2011    Past Medical History:  Diagnosis Date   Achalasia 09/28/2012   Allergic rhinitis 03/01/2015   Anemia    Aneurysm ([place]) 03/01/2015   stable 5 mm periopthalmic right ICA aneurysm 03/25/21 CTA   BP (high blood pressure) 03/01/2015   Bronchitis 04/23/2018   Cephalalgia 08/24/2012   Overview:  ICD-10 cut over     Chest pain 04/04/2011   Coronary Ca Score 0, no significant CAD 08/20/18; CP with troponin 4>32 s/p LHC showing normal coronaries 12/29/18   CN (constipation) 09/28/2012   Coughing 04/24/2017   Decreased potassium in the blood 03/01/2015   Diverticulosis    Dizziness 03/01/2015   Erythropoietin deficiency anemia 03/13/2021   GERD (gastroesophageal reflux disease)    Hiatal hernia    Hypercholesterolemia 03/01/2015   Hyperlipidemia    Hypertension    Ingrown toenail 08/10/2017   Inguinal hernia    Iron deficiency anemia 03/13/2021   Migraine headache    Onychomycosis 12/08/2017   Paresthesia of arm 03/01/2015   PE (pulmonary thromboembolism) ([place]) 03/13/2020   in setting of + COVID-19, s/p Eliquis x 3 month   Schatzki's ring  Tendonitis, Achilles, right 12/08/2017   Unilateral primary osteoarthritis, left knee 10/07/2017    Past Surgical History:  Procedure Laterality Date   ABDOMINAL HYSTERECTOMY     BACK SURGERY     x 3   CARDIAC CATHETERIZATION  12/29/2018   angiographically normal coronary arteries 12/29/18 ([place])    CARPAL TUNNEL RELEASE     left wrist   CERVICAL SPINE SURGERY     HEMORRHOID SURGERY     TOTAL HIP ARTHROPLASTY Left 09/04/2021   Procedure: LEFT TOTAL HIP ARTHROPLASTY ANTERIOR APPROACH;  Surgeon: Mcarthur Rossetti, MD;  Location: [facility];  Service: Orthopedics;  Laterality: Left;    Social History   Tobacco Use   Smoking status: Former    Packs/day: 0.50    Years: 20.00    Total pack years: 10.00    Types: Cigarettes    Quit date: 02/19/1984    Years since quitting: 37.9   Smokeless tobacco: Never  Vaping Use   Vaping Use: Never used  Substance Use Topics   Alcohol use: No   Drug use: No    Family History  Problem Relation Age of Onset   Allergic rhinitis Sister    Diabetes Sister    Hypertension Sister    Heart attack Father 68   Heart disease Father    Stomach cancer Maternal Grandmother    Heart disease Mother    Colon cancer Neg Hx    Angioedema Neg Hx    Asthma Neg Hx    Eczema Neg Hx    Urticaria Neg Hx    Immunodeficiency Neg Hx    Breast cancer Neg Hx    Migraines Neg Hx    Headache Neg Hx     Allergies  Allergen Reactions   Shellfish Allergy Hives and Swelling   Ace Inhibitors Other (See Comments)    Pt cannot recall her reaction but tolerates arb    Gabapentin Other (See Comments)    headaches   Pitavastatin     Unknown reaction    Sulfa Antibiotics Other (See Comments) and Rash    Fine bumps   Topiramate Palpitations    Heart race,     Medication list has been reviewed and updated.  Current Outpatient Medications on File Prior to Visit  Medication Sig Dispense Refill   acetaminophen (TYLENOL) 325 MG tablet Take 650 mg by mouth every 6 (six) hours as needed for moderate pain or headache.     aspirin 81 MG chewable tablet Chew 1 tablet (81 mg total) by mouth 2 (two) times daily. 30 tablet 0   Calcium Carb-Cholecalciferol (CALCIUM 600 + D PO) Take 1 tablet by mouth daily.     carboxymethylcellulose (REFRESH PLUS) 0.5 % SOLN Place 1 drop  into both eyes 3 (three) times daily as needed (dry eyes).     diclofenac (VOLTAREN) 75 MG EC tablet Take 1 tablet (75 mg total) by mouth 2 (two) times daily. 30 tablet 0   ELDERBERRY PO Take 1 capsule by mouth daily.     EPINEPHrine (EPIPEN 2-PAK) 0.3 mg/0.3 mL IJ SOAJ injection Inject 0.3 mg into the muscle as needed for anaphylaxis. Per allergen immunotherapy protocol 1 each 2   fluticasone (FLONASE) 50 MCG/ACT nasal spray Place 2 sprays into both nostrils daily as needed for allergies. 48 g 3   KLOR-CON M20 20 MEQ tablet TAKE 2 TABLETS BY MOUTH EVERY DAY 180 tablet 1   losartan (COZAAR) 100 MG tablet Take 50 mg by mouth daily.  Pt takes 50 mg 1/2 tablet once a day.     MAGNESIUM PO Take 1 tablet by mouth daily.     methylPREDNISolone (MEDROL DOSEPAK) 4 MG TBPK tablet Take as directed 21 tablet 0   Multiple Vitamins-Minerals (ALIVE MULTI-VITAMIN PO) Take 1 tablet by mouth daily.     NON FORMULARY Pt uses a cpap nightly     nortriptyline (PAMELOR) 25 MG capsule Take 2 capsules (50 mg total) by mouth at bedtime. 180 capsule 3   omeprazole (PRILOSEC) 40 MG capsule Take 40 mg by mouth daily.     rosuvastatin (CRESTOR) 10 MG tablet TAKE 1 TABLET BY MOUTH EVERY DAY 90 tablet 3   spironolactone (ALDACTONE) 25 MG tablet Take 12.5 mg by mouth daily. Pt takes 1/2 tablet 12.5 mg daily.     zinc gluconate 50 MG tablet Take 50 mg by mouth daily.     No current facility-administered medications on file prior to visit.    Review of Systems:  As per HPI- otherwise negative.   Physical Examination: There were no vitals filed for this visit. There were no vitals filed for this visit. There is no height or weight on file to calculate BMI. Ideal Body Weight:    GEN: no acute distress. HEENT: Atraumatic, Normocephalic.  Ears and Nose: No external deformity. CV: RRR, No M/G/R. No JVD. No thrill. No extra heart sounds. PULM: CTA B, no wheezes, crackles, rhonchi. No retractions. No resp. distress. No  accessory muscle use. ABD: S, NT, ND, +BS. No rebound. No HSM. EXTR: No c/c/e PSYCH: Normally interactive. Conversant.    Assessment and Plan: ***  Signed Lamar Blinks, MD

## 2022-02-04 ENCOUNTER — Ambulatory Visit: Payer: Medicare Other | Admitting: Podiatry

## 2022-02-04 DIAGNOSIS — M722 Plantar fascial fibromatosis: Secondary | ICD-10-CM

## 2022-02-04 DIAGNOSIS — M76821 Posterior tibial tendinitis, right leg: Secondary | ICD-10-CM

## 2022-02-04 MED ORDER — TRIAMCINOLONE ACETONIDE 10 MG/ML IJ SUSP
10.0000 mg | Freq: Once | INTRAMUSCULAR | Status: AC
  Administered 2022-02-04: 10 mg

## 2022-02-04 MED ORDER — MELOXICAM 7.5 MG PO TABS: 7.5000 mg | ORAL_TABLET | Freq: Every day | ORAL | 0 refills | Status: DC | PRN

## 2022-02-04 NOTE — Patient Instructions (Addendum)
I have ordered a MRI of the right ankle for the [facility]. If you do not hear for them about scheduling within the next 1 week, or you have any questions please give [place] a call at [phone].  ---  While at your visit today you received a steroid injection in your foot or ankle to help with your pain. Along with having the steroid medication there is some "numbing" medication in the shot that you received. Due to this you may notice some numbness to the area for the next couple of hours.   I would recommend limiting activity for the next few days to help the steroid injection take affect.    The actually benefit from the steroid injection may take up to 2-7 days to see a difference. You may actually experience a small (as in 10%) INCREASE in pain in the first 24 hours---that is common. It would be best if you can ice the area today and take anti-inflammatory medications (such as Ibuprofen, Motrin, or Aleve) if you are able to take these medications. If you were prescribed another medication to help with the pain go ahead and start that medication today    Things to watch out for that you should contact us or a health care provider urgently would include: 1. Unusual (as in more than 10%) increase in pain 2. New fever > 101.5 3. New swelling or redness of the injected area.  4. Streaking of red lines around the area injected.  If you have any questions or concerns about this, please give our office a call at [phone].   --- Meloxicam Tablets What is this medication? MELOXICAM (mel OX i cam) treats mild to moderate pain, inflammation, or arthritis. It works by decreasing inflammation. It belongs to a group of medications called NSAIDs. This medicine may be used for other purposes; ask your health care provider or pharmacist if you have questions. COMMON BRAND NAME(S): Mobic What should I tell my care team before I take this medication? They need to know if you have any of  these conditions: Asthma (lung or breathing disease) Bleeding disorder Coronary artery bypass graft (CABG) within the past 2 weeks Dehydration Frequently drink alcohol Heart attack Heart disease Heart failure High blood pressure Kidney disease Liver disease Stomach bleeding Stomach ulcers, other stomach or intestine problems Take medications that treat or prevent blood clots Taking other steroids, such as dexamethasone or prednisone Tobacco use An unusual or allergic reaction to meloxicam, other medications, foods, dyes, or preservatives Pregnant or trying to get pregnant Breast-feeding How should I use this medication? Take this medication by mouth. Take it as directed on the prescription label at the same time every day. You can take it with or without food. If it upsets your stomach, take it with food. Do not use it more often than directed. There may be unused or extra doses in the bottle after you finish your treatment. Talk to your care team if you have questions about your dose. A special MedGuide will be given to you by the pharmacist with each prescription and refill. Be sure to read this information carefully each time. Talk to your care team about the use of this medication in children. Special care may be needed. People over 57 years of age may have a stronger reaction and need a smaller dose. Overdosage: If you think you have taken too much of this medicine contact a poison control center or emergency room at once. NOTE: This medicine  is only for you. Do not share this medicine with others. What if I miss a dose? If you miss a dose, take it as soon as you can. If it is almost time for your next dose, take only that dose. Do not take double or extra doses. What may interact with this medication? Do not take this medication with any of the following: Cidofovir Ketorolac This medication may also interact with the following: Alcohol Aspirin and aspirin-like  medications Certain medications for blood pressure, heart disease, irregular heart beat Certain medications for mental health conditions Certain medications that treat or prevent blood clots, such as warfarin, enoxaparin, dalteparin, apixaban, dabigatran, rivaroxaban Cyclosporine Diuretics Fluconazole Lithium Methotrexate Other NSAIDs, medications for pain and inflammation, such as ibuprofen and naproxen Pemetrexed This list may not describe all possible interactions. Give your health care provider a list of all the medicines, herbs, non-prescription drugs, or dietary supplements you use. Also tell them if you smoke, drink alcohol, or use illegal drugs. Some items may interact with your medicine. What should I watch for while using this medication? Visit your care team for regular checks on your progress. Tell your care team if your symptoms do not start to get better or if they get worse. Do not take other medications that contain aspirin, ibuprofen, or naproxen with this medication. Side effects such as stomach upset, nausea, or ulcers may be more likely to occur. Many non-prescription medications contain aspirin, ibuprofen, or naproxen. Always read labels carefully. This medication can cause serious ulcers and bleeding in the stomach. It can happen with no warning. Tobacco, alcohol, older age, and poor health can also increase risks. Call your care team right away if you have stomach pain or blood in your vomit or stool. This medication does not prevent a heart attack or stroke. This medication may increase the chance of a heart attack or stroke. The chance may increase the longer you use this medication or if you have heart disease. If you take aspirin to prevent a heart attack or stroke, talk to your care team about using this medication. This medication may cause serious skin reactions. They can happen weeks to months after starting the medication. Contact your care team right away if you  notice fevers or flu-like symptoms with a rash. The rash may be red or purple and then turn into blisters or peeling of the skin. Or, you might notice a red rash with swelling of the face, lips or lymph nodes in your neck or under your arms. Talk to your care team if you wish to become pregnant or think you might be pregnant. This medication can cause serious birth defects. This medication may affect your coordination, reaction time, or judgment. Do not drive or operate machinery until you know how this medication affects you. Sit up or stand slowly to reduce the risk of dizzy or fainting spells. Drinking alcohol with this medication can increase the risk of these side effects. Be careful brushing or flossing your teeth or using a toothpick because you may get an infection or bleed more easily. If you have any dental work done, tell your dentist you are receiving this medication. This medication may make it more difficult to get pregnant. Talk to your care team if you are concerned about your fertility. What side effects may I notice from receiving this medication? Side effects that you should report to your care team as soon as possible: Allergic reactions--skin rash, itching, hives, swelling of the face, lips,  tongue, or throat Bleeding--bloody or black, tar-like stools, vomiting blood or brown material that looks like coffee grounds, red or dark brown urine, small red or purple spots on skin, unusual bruising or bleeding Heart attack--pain or tightness in the chest, shoulders, arms, or jaw, nausea, shortness of breath, cold or clammy skin, feeling faint or lightheaded Heart failure--shortness of breath, swelling of ankles, feet, or hands, sudden weight gain, unusual weakness or fatigue Increase in blood pressure Kidney injury--decrease in the amount of urine, swelling of the ankles, hands, or feet Liver injury--right upper belly pain, loss of appetite, nausea, light-colored stool, dark yellow or  brown urine, yellowing skin or eyes, unusual weakness or fatigue Rash, fever, and swollen lymph nodes Redness, blistering, peeling, or loosening of the skin, including inside the mouth Stroke--sudden numbness or weakness of the face, arm, or leg, trouble speaking, confusion, trouble walking, loss of balance or coordination, dizziness, severe headache, change in vision Side effects that usually do not require medical attention (report to your care team if they continue or are bothersome): Diarrhea Nausea Upset stomach This list may not describe all possible side effects. Call your doctor for medical advice about side effects. You may report side effects to FDA at 1-800-FDA-1088. Where should I keep my medication? Keep out of the reach of children and pets. Store at room temperature between 20 and 25 degrees C (68 and 77 degrees F). Protect from moisture. Keep the container tightly closed. Get rid of any unused medication after the expiration date. To get rid of medications that are no longer needed or have expired: Take the medication to a medication take-back program. Check with your pharmacy or law enforcement to find a location. If you cannot return the medication, check the label or package insert to see if the medication should be thrown out in the garbage or flushed down the toilet. If you are not sure, ask your care team. If it is safe to put it in the trash, empty the medication out of the container. Mix the medication with cat litter, dirt, coffee grounds, or other unwanted substance. Seal the mixture in a bag or container. Put it in the trash. NOTE: This sheet is a summary. It may not cover all possible information. If you have questions about this medicine, talk to your doctor, pharmacist, or health care provider.  2023 Elsevier/Gold Standard (2020-10-18 00:00:00)

## 2022-02-06 ENCOUNTER — Ambulatory Visit (INDEPENDENT_AMBULATORY_CARE_PROVIDER_SITE_OTHER): Payer: Medicare Other | Admitting: Family Medicine

## 2022-02-06 ENCOUNTER — Other Ambulatory Visit: Payer: Self-pay

## 2022-02-06 ENCOUNTER — Emergency Department (HOSPITAL_BASED_OUTPATIENT_CLINIC_OR_DEPARTMENT_OTHER): Payer: Medicare Other

## 2022-02-06 ENCOUNTER — Emergency Department (HOSPITAL_BASED_OUTPATIENT_CLINIC_OR_DEPARTMENT_OTHER)
Admission: EM | Admit: 2022-02-06 | Discharge: 2022-02-06 | Disposition: A | Discharge status: Home / Self Care | Payer: Medicare Other | Attending: Emergency Medicine | Admitting: Emergency Medicine

## 2022-02-06 ENCOUNTER — Telehealth: Payer: Self-pay

## 2022-02-06 VITALS — BP 122/68 | HR 84 | Temp 97.7°F | Resp 18 | Ht 65.0 in | Wt 230.2 lb

## 2022-02-06 DIAGNOSIS — K5900 Constipation, unspecified: Secondary | ICD-10-CM | POA: Diagnosis not present

## 2022-02-06 DIAGNOSIS — Z1152 Encounter for screening for COVID-19: Secondary | ICD-10-CM | POA: Diagnosis not present

## 2022-02-06 DIAGNOSIS — I10 Essential (primary) hypertension: Secondary | ICD-10-CM | POA: Insufficient documentation

## 2022-02-06 DIAGNOSIS — E785 Hyperlipidemia, unspecified: Secondary | ICD-10-CM | POA: Diagnosis not present

## 2022-02-06 DIAGNOSIS — I700 Atherosclerosis of aorta: Secondary | ICD-10-CM | POA: Diagnosis not present

## 2022-02-06 DIAGNOSIS — Z7982 Long term (current) use of aspirin: Secondary | ICD-10-CM | POA: Diagnosis not present

## 2022-02-06 DIAGNOSIS — M25511 Pain in right shoulder: Secondary | ICD-10-CM | POA: Insufficient documentation

## 2022-02-06 DIAGNOSIS — R0602 Shortness of breath: Secondary | ICD-10-CM

## 2022-02-06 DIAGNOSIS — R079 Chest pain, unspecified: Secondary | ICD-10-CM | POA: Diagnosis not present

## 2022-02-06 DIAGNOSIS — I25119 Atherosclerotic heart disease of native coronary artery with unspecified angina pectoris: Secondary | ICD-10-CM

## 2022-02-06 DIAGNOSIS — Z79899 Other long term (current) drug therapy: Secondary | ICD-10-CM | POA: Insufficient documentation

## 2022-02-06 DIAGNOSIS — G4733 Obstructive sleep apnea (adult) (pediatric): Secondary | ICD-10-CM | POA: Diagnosis not present

## 2022-02-06 DIAGNOSIS — R140 Abdominal distension (gaseous): Secondary | ICD-10-CM | POA: Diagnosis not present

## 2022-02-06 DIAGNOSIS — R109 Unspecified abdominal pain: Secondary | ICD-10-CM | POA: Diagnosis not present

## 2022-02-06 DIAGNOSIS — R7303 Prediabetes: Secondary | ICD-10-CM

## 2022-02-06 DIAGNOSIS — Z5181 Encounter for therapeutic drug level monitoring: Secondary | ICD-10-CM

## 2022-02-06 DIAGNOSIS — K449 Diaphragmatic hernia without obstruction or gangrene: Secondary | ICD-10-CM | POA: Diagnosis not present

## 2022-02-06 LAB — RESP PANEL BY RT-PCR (RSV, FLU A&B, COVID)  RVPGX2
Influenza A by PCR: NEGATIVE
Influenza B by PCR: NEGATIVE
Resp Syncytial Virus by PCR: NEGATIVE
SARS Coronavirus 2 by RT PCR: NEGATIVE

## 2022-02-06 LAB — CBC WITH DIFFERENTIAL/PLATELET
Abs Immature Granulocytes: 0.02 10*3/uL (ref 0.00–0.07)
Basophils Absolute: 0 10*3/uL (ref 0.0–0.1)
Basophils Relative: 0 %
Eosinophils Absolute: 0.3 10*3/uL (ref 0.0–0.5)
Eosinophils Relative: 4 %
HCT: 35.3 % — ABNORMAL LOW (ref 36.0–46.0)
Hemoglobin: 11.5 g/dL — ABNORMAL LOW (ref 12.0–15.0)
Immature Granulocytes: 0 %
Lymphocytes Relative: 37 %
Lymphs Abs: 3 10*3/uL (ref 0.7–4.0)
MCH: 29.3 pg (ref 26.0–34.0)
MCHC: 32.6 g/dL (ref 30.0–36.0)
MCV: 90.1 fL (ref 80.0–100.0)
Monocytes Absolute: 0.6 10*3/uL (ref 0.1–1.0)
Monocytes Relative: 7 %
Neutro Abs: 4.2 10*3/uL (ref 1.7–7.7)
Neutrophils Relative %: 52 %
Platelets: 309 10*3/uL (ref 150–400)
RBC: 3.92 MIL/uL (ref 3.87–5.11)
RDW: 14.8 % (ref 11.5–15.5)
WBC: 8 10*3/uL (ref 4.0–10.5)
nRBC: 0 % (ref 0.0–0.2)

## 2022-02-06 LAB — COMPREHENSIVE METABOLIC PANEL
ALT: 15 U/L (ref 0–44)
AST: 20 U/L (ref 15–41)
Albumin: 3.4 g/dL — ABNORMAL LOW (ref 3.5–5.0)
Alkaline Phosphatase: 87 U/L (ref 38–126)
Anion gap: 6 (ref 5–15)
BUN: 14 mg/dL (ref 8–23)
CO2: 29 mmol/L (ref 22–32)
Calcium: 9.2 mg/dL (ref 8.9–10.3)
Chloride: 103 mmol/L (ref 98–111)
Creatinine, Ser: 0.92 mg/dL (ref 0.44–1.00)
GFR, Estimated: >60 mL/min (ref >60)
Glucose, Bld: 98 mg/dL (ref 70–99)
Potassium: 3.8 mmol/L (ref 3.5–5.1)
Sodium: 138 mmol/L (ref 135–145)
Total Bilirubin: 0.3 mg/dL (ref 0.3–1.2)
Total Protein: 7.5 g/dL (ref 6.5–8.1)

## 2022-02-06 LAB — D-DIMER, QUANTITATIVE: D-Dimer, Quant: 0.81 ug/mL-FEU — ABNORMAL HIGH (ref 0.00–0.50)

## 2022-02-06 LAB — BRAIN NATRIURETIC PEPTIDE: B Natriuretic Peptide: 33.7 pg/mL (ref 0.0–100.0)

## 2022-02-06 LAB — TROPONIN I (HIGH SENSITIVITY): Troponin I (High Sensitivity): 3 ng/L (ref <18)

## 2022-02-06 MED ORDER — IOHEXOL 350 MG/ML SOLN
100.0000 mL | Freq: Once | INTRAVENOUS | Status: AC | PRN
  Administered 2022-02-06: 100 mL via INTRAVENOUS

## 2022-02-06 MED ORDER — SENNOSIDES-DOCUSATE SODIUM 8.6-50 MG PO TABS: 1.0000 | ORAL_TABLET | Freq: Every day | ORAL | 0 refills | Status: DC

## 2022-02-06 MED ORDER — ACETAMINOPHEN 325 MG PO TABS
650.0000 mg | ORAL_TABLET | Freq: Once | ORAL | Status: AC
  Administered 2022-02-06: 650 mg via ORAL
  Filled 2022-02-06: qty 2

## 2022-02-06 MED ORDER — POLYETHYLENE GLYCOL 3350 17 G PO PACK: 17.0000 g | PACK | Freq: Every day | ORAL | 0 refills | Status: DC

## 2022-02-06 MED ORDER — METAMUCIL 0.52 G PO CAPS: 0.5200 g | ORAL_CAPSULE | Freq: Every day | ORAL | 0 refills | Status: DC

## 2022-02-06 NOTE — Discharge Instructions (Signed)
You were seen in the emergency department for your abdominal distention and your shortness of breath.  Your workup showed no signs of pneumonia, blood clots, fluid in your lungs or belly or infection.  You do have constipation which may be contributing to your abdomen being swollen and making you feel short of breath.  You should start taking MiraLAX, Senokot and Metamucil daily to help with your constipation.  You should take the MiraLAX daily until you are having regular soft bowel movements every day, then you can cut it in half until you are again having regular soft bowel movements every day and then can return to as needed.  Once you cut back on the MiraLAX, you can start to cut back on the Senokot.  The Metamucil as a fiber supplement that you should continue to take daily and make sure that you are drinking plenty of water as well to stay hydrated.  You should follow-up with your primary doctor to have your symptoms rechecked.  You should return to the emergency department for fevers, significantly worsening abdominal pain, repetitive vomiting or if you have any other new or concerning symptoms.

## 2022-02-06 NOTE — ED Notes (Signed)
Patient transported to CT 

## 2022-02-06 NOTE — ED Notes (Signed)
Pt requested medicine for her HA on the L side.

## 2022-02-06 NOTE — Telephone Encounter (Signed)
On the way to the ER the patient asks if she can come back for a cortisone injection in the shoulder?

## 2022-02-06 NOTE — ED Triage Notes (Signed)
Pt sent from PCP for abnormal EKG, pt has increased swelling in abd with SOB x 2 weeks. Also complains of right shoulder pain for several months.

## 2022-02-06 NOTE — ED Provider Notes (Signed)
[facility] EMERGENCY DEPARTMENT Provider Note   CSN: [id number] Arrival date & time: 02/06/22  1149     History  Chief Complaint  Patient presents with   Shortness of Breath    Stephanie Parks is a 76 y.o. female.  Patient is a 76 year old female with a past medical history of hypertension and hyperlipidemia presenting to the emergency department with abdominal distention and shortness of breath.  The patient states over the last 2 weeks she has felt like her abdomen has become increasingly more distended and bloated.  She states that she does have a history of constipation and has been taking a stool softener and has been having bowel movements at least every other day but feels like she is not completely emptying.  She denies any black or bloody stools.  She states that she has had increasing amount of shortness of breath which she thinks may be related to her abdominal distention.  She states that she has had mild associated left-sided chest pain as well as right shoulder pain though she does have a history of a rotator cuff injury in her right shoulder.  She denies any nausea or vomiting, fevers or chills, cough or congestion.  She was seen at her primary doctor today who recommended that she come to the emergency department for further evaluation.   Shortness of Breath      Home Medications Prior to Admission medications   Medication Sig Start Date End Date Taking? Authorizing Provider  polyethylene glycol (MIRALAX) 17 g packet Take 17 g by mouth daily. 02/06/22  Yes Maylon Peppers, [place] K, DO  psyllium (METAMUCIL) 0.52 g capsule Take 1 capsule (0.52 g total) by mouth daily. 02/06/22  Yes Kingsley, [place], DO  senna-docusate (SENOKOT-S) 8.6-50 MG tablet Take 1 tablet by mouth daily. 02/06/22  Yes Leanord Asal K, DO  acetaminophen (TYLENOL) 325 MG tablet Take 650 mg by mouth every 6 (six) hours as needed for moderate pain or headache.    [provider]  aspirin 81 MG chewable tablet Chew 1 tablet (81 mg total) by mouth 2 (two) times daily. 09/06/21   Mcarthur Rossetti, MD  Calcium Carb-Cholecalciferol (CALCIUM 600 + D PO) Take 1 tablet by mouth daily.    [provider]  carboxymethylcellulose (REFRESH PLUS) 0.5 % SOLN Place 1 drop into both eyes 3 (three) times daily as needed (dry eyes).    [provider]  ELDERBERRY PO Take 1 capsule by mouth daily.    [provider]  EPINEPHrine (EPIPEN 2-PAK) 0.3 mg/0.3 mL IJ SOAJ injection Inject 0.3 mg into the muscle as needed for anaphylaxis. Per allergen immunotherapy protocol 09/27/21   Copland, Gay Filler, MD  fluticasone (FLONASE) 50 MCG/ACT nasal spray Place 2 sprays into both nostrils daily as needed for allergies. 04/11/21   Copland, Gay Filler, MD  KLOR-CON M20 20 MEQ tablet TAKE 2 TABLETS BY MOUTH EVERY DAY 01/12/22   Copland, Gay Filler, MD  losartan (COZAAR) 100 MG tablet Take 50 mg by mouth daily. Pt takes 50 mg 1/2 tablet once a day.    [provider]  MAGNESIUM PO Take 1 tablet by mouth daily.    [provider]  Multiple Vitamins-Minerals (ALIVE MULTI-VITAMIN PO) Take 1 tablet by mouth daily.    [provider]  NON FORMULARY Pt uses a cpap nightly    [provider]  nortriptyline (PAMELOR) 25 MG capsule Take 2 capsules (50 mg total) by mouth at bedtime.  03/21/21   Lomax, Amy, NP  omeprazole (PRILOSEC) 40 MG capsule Take 40 mg by mouth daily. 10/05/20   [provider]  rosuvastatin (CRESTOR) 10 MG tablet TAKE 1 TABLET BY MOUTH EVERY DAY 01/12/22   Copland, Gay Filler, MD  spironolactone (ALDACTONE) 25 MG tablet Take 12.5 mg by mouth daily. Pt takes 1/2 tablet 12.5 mg daily.    [provider]  zinc gluconate 50 MG tablet Take 50 mg by mouth daily.    [provider]      Allergies    Shellfish allergy, Ace inhibitors, Gabapentin, Pitavastatin, Sulfa antibiotics, and Topiramate    Review of  Systems   Review of Systems  Respiratory:  Positive for shortness of breath.     Physical Exam Updated Vital Signs BP (!) 142/64 (BP Location: Right Arm)   Pulse 90   Temp 98.1 F (36.7 C) (Oral)   Resp 15   Ht '5\' 5"'$  (1.651 m)   Wt 104.4 kg   SpO2 98%   BMI 38.30 kg/m  Physical Exam Vitals and nursing note reviewed.  Constitutional:      General: She is not in acute distress.    Appearance: She is well-developed. She is obese.  HENT:     Head: Normocephalic and atraumatic.     Mouth/Throat:     Mouth: Mucous membranes are moist.     Pharynx: Oropharynx is clear.  Eyes:     Extraocular Movements: Extraocular movements intact.  Cardiovascular:     Rate and Rhythm: Normal rate and regular rhythm.     Heart sounds: Normal heart sounds.  Pulmonary:     Effort: Pulmonary effort is normal.     Breath sounds: Normal breath sounds.  Abdominal:     Palpations: Abdomen is soft.     Tenderness: There is no abdominal tenderness. There is no guarding or rebound.     Comments: Mild abdominal distention, no palpable fluid wave  Musculoskeletal:        General: Normal range of motion.     Cervical back: Normal range of motion and neck supple.     Right lower leg: No edema.     Left lower leg: No edema.  Skin:    General: Skin is dry.  Neurological:     General: No focal deficit present.     Mental Status: She is alert and oriented to person, place, and time.  Psychiatric:        Mood and Affect: Mood normal.        Behavior: Behavior normal.     ED Results / Procedures / Treatments   Labs (all labs ordered are listed, but only abnormal results are displayed) Labs Reviewed  CBC WITH DIFFERENTIAL/PLATELET - Abnormal; Notable for the following components:      Result Value   Hemoglobin 11.5 (*)    HCT 35.3 (*)    All other components within normal limits  COMPREHENSIVE METABOLIC PANEL - Abnormal; Notable for the following components:   Albumin 3.4 (*)    All other  components within normal limits  D-DIMER, QUANTITATIVE - Abnormal; Notable for the following components:   D-Dimer, Quant 0.81 (*)    All other components within normal limits  RESP PANEL BY RT-PCR (RSV, FLU A&B, COVID)  [id number]  BRAIN NATRIURETIC PEPTIDE  TROPONIN I (HIGH SENSITIVITY)    EKG EKG Interpretation  Date/Time:  Wednesday February 06 2022 12:09:16 EST Ventricular Rate:  83 PR Interval:  190 QRS Duration:  94 QT Interval:  376 QTC Calculation: 441 R Axis:   7 Text Interpretation: Normal sinus rhythm Low voltage QRS Borderline ECG When compared with ECG of 29-Sep-2021 19:13, PREVIOUS ECG IS PRESENT No sig changes Confirmed by Octaviano Glow [phone]) on 02/06/2022 1:30:13 PM  Radiology CT Angio Chest PE W/Cm &/Or Wo Cm  Result Date: 02/06/2022 CLINICAL DATA:  Abdominal pain and right shoulder pain EXAM: CT ANGIOGRAPHY CHEST CT ABDOMEN AND PELVIS WITH CONTRAST TECHNIQUE: Multidetector CT imaging of the chest was performed using the standard protocol during bolus administration of intravenous contrast. Multiplanar CT image reconstructions and MIPs were obtained to evaluate the vascular anatomy. Multidetector CT imaging of the abdomen and pelvis was performed using the standard protocol during bolus administration of intravenous contrast. RADIATION DOSE REDUCTION: This exam was performed according to the departmental dose-optimization program which includes automated exposure control, adjustment of the mA and/or kV according to patient size and/or use of iterative reconstruction technique. CONTRAST:  175m OMNIPAQUE IOHEXOL 350 MG/ML SOLN COMPARISON:  CT abdomen and pelvis dated July 31, 2021 FINDINGS: CTA CHEST FINDINGS Cardiovascular: Normal heart size. No pericardial effusion. Normal caliber thoracic aorta with mild atherosclerotic disease. No evidence of pulmonary embolus, although poor contrast opacification somewhat limits evaluation of the subsegmental pulmonary arteries.  Mediastinum/Nodes: Small to moderate hiatal hernia. Thyroid is unremarkable. No pathologically enlarged lymph nodes seen in the chest. Calcified mediastinal and hilar lymph nodes, likely sequela of prior granulomatous infection Lungs/Pleura: Central airways are patent. Mild bilateral upper lobe predominant linear opacities, likely sequela of prior COVID-19 infection. No consolidation, pleural effusion or pneumothorax. Musculoskeletal: No chest wall abnormality. No acute or significant osseous findings. Review of the MIP images confirms the above findings. CT ABDOMEN and PELVIS FINDINGS Hepatobiliary: No focal liver abnormality is seen. No gallstones, gallbladder wall thickening, or biliary dilatation. Pancreas: Unremarkable. No pancreatic ductal dilatation or surrounding inflammatory changes. Spleen: Normal in size without focal abnormality. Adrenals/Urinary Tract: Bilateral adrenal glands are unremarkable. No hydronephrosis or nephrolithiasis. Bladder is unremarkable. Stomach/Bowel: Stomach is within normal limits. Appendix appears normal. No evidence of bowel wall thickening, distention, or inflammatory changes. Vascular/Lymphatic: Aortic atherosclerosis. No enlarged abdominal or pelvic lymph nodes. Reproductive: No adnexal masses. Other: Small fat containing left inguinal hernia. No abdominopelvic ascites Musculoskeletal: Prior left hip replacement. No aggressive appearing osseous lesions. Review of the MIP images confirms the above findings. IMPRESSION: 1. No evidence of pulmonary embolus, although poor contrast opacification somewhat limits evaluation of the subsegmental pulmonary arteries. 2. No acute findings in the abdomen or pelvis. 3. Aortic Atherosclerosis (ICD10-I70.0). Electronically Signed   By: LYetta GlassmanM.D.   On: 02/06/2022 17:34   CT Abdomen Pelvis W Contrast  Result Date: 02/06/2022 CLINICAL DATA:  Abdominal pain and right shoulder pain EXAM: CT ANGIOGRAPHY CHEST CT ABDOMEN AND PELVIS  WITH CONTRAST TECHNIQUE: Multidetector CT imaging of the chest was performed using the standard protocol during bolus administration of intravenous contrast. Multiplanar CT image reconstructions and MIPs were obtained to evaluate the vascular anatomy. Multidetector CT imaging of the abdomen and pelvis was performed using the standard protocol during bolus administration of intravenous contrast. RADIATION DOSE REDUCTION: This exam was performed according to the departmental dose-optimization program which includes automated exposure control, adjustment of the mA and/or kV according to patient size and/or use of iterative reconstruction technique. CONTRAST:  1077mOMNIPAQUE IOHEXOL 350 MG/ML SOLN COMPARISON:  CT abdomen and pelvis dated July 31, 2021 FINDINGS: CTA CHEST FINDINGS Cardiovascular: Normal heart size. No pericardial effusion. Normal  caliber thoracic aorta with mild atherosclerotic disease. No evidence of pulmonary embolus, although poor contrast opacification somewhat limits evaluation of the subsegmental pulmonary arteries. Mediastinum/Nodes: Small to moderate hiatal hernia. Thyroid is unremarkable. No pathologically enlarged lymph nodes seen in the chest. Calcified mediastinal and hilar lymph nodes, likely sequela of prior granulomatous infection Lungs/Pleura: Central airways are patent. Mild bilateral upper lobe predominant linear opacities, likely sequela of prior COVID-19 infection. No consolidation, pleural effusion or pneumothorax. Musculoskeletal: No chest wall abnormality. No acute or significant osseous findings. Review of the MIP images confirms the above findings. CT ABDOMEN and PELVIS FINDINGS Hepatobiliary: No focal liver abnormality is seen. No gallstones, gallbladder wall thickening, or biliary dilatation. Pancreas: Unremarkable. No pancreatic ductal dilatation or surrounding inflammatory changes. Spleen: Normal in size without focal abnormality. Adrenals/Urinary Tract: Bilateral adrenal  glands are unremarkable. No hydronephrosis or nephrolithiasis. Bladder is unremarkable. Stomach/Bowel: Stomach is within normal limits. Appendix appears normal. No evidence of bowel wall thickening, distention, or inflammatory changes. Vascular/Lymphatic: Aortic atherosclerosis. No enlarged abdominal or pelvic lymph nodes. Reproductive: No adnexal masses. Other: Small fat containing left inguinal hernia. No abdominopelvic ascites Musculoskeletal: Prior left hip replacement. No aggressive appearing osseous lesions. Review of the MIP images confirms the above findings. IMPRESSION: 1. No evidence of pulmonary embolus, although poor contrast opacification somewhat limits evaluation of the subsegmental pulmonary arteries. 2. No acute findings in the abdomen or pelvis. 3. Aortic Atherosclerosis (ICD10-I70.0). Electronically Signed   By: Yetta Glassman M.D.   On: 02/06/2022 17:34   DG Chest 2 View  Result Date: 02/06/2022 CLINICAL DATA:  Shortness of breath. EXAM: CHEST - 2 VIEW COMPARISON:  03/07/2021 FINDINGS: Low volume film. The lungs are clear without focal pneumonia, edema, pneumothorax or pleural effusion. Interstitial markings are diffusely coarsened with chronic features. Cardiopericardial silhouette is at upper limits of normal for size. The visualized bony structures of the thorax are unremarkable. IMPRESSION: Low volume film without acute cardiopulmonary findings. Electronically Signed   By: Misty Stanley M.D.   On: 02/06/2022 12:18    Procedures Procedures    Medications Ordered in ED Medications  acetaminophen (TYLENOL) tablet 650 mg (650 mg Oral Given 02/06/22 1531)  iohexol (OMNIPAQUE) 350 MG/ML injection 100 mL (100 mLs Intravenous Contrast Given 02/06/22 1535)    ED Course/ Medical Decision Making/ A&P Clinical Course as of 02/06/22 1810  Wed Feb 06, 2022  1613 Initial troponin negative, pain has been ongoing for several days so single troponin is sufficient.  CTs are pending at this  time. [VK]  1806 CTs are negative for acute disease.  She does have significant stool burden which is likely contributing to her abdominal distention.  She will be given a bowel regimen and was recommended primary care follow-up.  She was given strict return precautions. [VK]    Clinical Course User Index [VK] Kemper Durie, DO                           Medical Decision Making This patient presents to the ED with chief complaint(s) of abdominal distension, SOB with pertinent past medical history of HTN, HLD which further complicates the presenting complaint. The complaint involves an extensive differential diagnosis and also carries with it a high risk of complications and morbidity.    The differential diagnosis includes ACS, arrhythmia, pneumonia, pneumothorax, pulmonary edema, pleural effusion, ascites, organomegaly, constipation, SBO unlikely as patient still having bowel movements, viral syndrome, PE less likely as patient has no  known PE risk factors and is not hypoxic or tachycardic  Additional history obtained: Additional history obtained from N/A Records reviewed Primary Care Documents  ED Course and Reassessment: Patient was initially evaluated by triage and had labs including D-dimer and BNP performed.  Patient's D-dimer is elevated and should have a CT PE study performed.  Remainder of her labs are within normal range.  EKG is nonischemic which she will have troponin performed due to her associated left-sided chest pain and cardiac risk factors.  CT abdomen and pelvis will be performed to evaluate for potential ascites or organomegaly as causes of her distention.  Independent labs interpretation:  The following labs were independently interpreted: Elevated D-dimer, otherwise within normal range  Independent visualization of imaging: - I independently visualized the following imaging with scope of interpretation limited to determining acute life threatening conditions related  to emergency care: CT PE, CT AP, which revealed no acute disease, moderate stool burden  Consultation: - Consulted or discussed management/test interpretation w/ external professional: N/A  Consideration for admission or further workup: Patient has no emergent conditions requiring admission or further work-up at this time and is stable for discharge home with primary care follow-up  Social Determinants of health: N/A    Amount and/or Complexity of Data Reviewed Labs: ordered. Radiology: ordered.  Risk OTC drugs. Prescription drug management.          Final Clinical Impression(s) / ED Diagnoses Final diagnoses:  Constipation, unspecified constipation type  Shortness of breath    Rx / DC Orders ED Discharge Orders          Ordered    polyethylene glycol (MIRALAX) 17 g packet  Daily        02/06/22 1808    psyllium (METAMUCIL) 0.52 g capsule  Daily        02/06/22 1808    senna-docusate (SENOKOT-S) 8.6-50 MG tablet  Daily        02/06/22 1808              Kemper Durie, DO 02/06/22 1810

## 2022-02-07 ENCOUNTER — Ambulatory Visit (INDEPENDENT_AMBULATORY_CARE_PROVIDER_SITE_OTHER): Payer: Medicare Other

## 2022-02-07 ENCOUNTER — Encounter: Payer: Self-pay | Admitting: Family Medicine

## 2022-02-07 DIAGNOSIS — J309 Allergic rhinitis, unspecified: Secondary | ICD-10-CM | POA: Diagnosis not present

## 2022-02-07 NOTE — Progress Notes (Signed)
Subjective: Chief Complaint  Patient presents with   Foot Pain    Patient came in today for right foot tendon pain, patient rates the pain 9 out of 10, TX: trilock brace, pain meds    76 year old female presents the office today with above concerns.  She states she is continue to have pain on the right side.  Symptoms are unchanged.  Objective: AAO x3, NAD DP/PT pulses palpable bilaterally, CRT less than 3 seconds There is continuation tenderness palpation on the course the posterior tibial tendon posterior and inferior to the medial malleolus there is mild edema.  Is also tenderness palpation on plantar aspect calcaneus on the insertion of plantar fascia on the medial heel distal to the calcaneus.  There is no area pinpoint tenderness.  MMT 5/5. No pain with calf compression, swelling, warmth, erythema  Assessment: Right posterior tibial tendinitis, Planter fasciitis  Plan: -All treatment options discussed with the patient including all alternatives, risks, complications.  -Steroid injection performed the plantar fascia.  See procedure note below. -Given ongoing nature of symptoms despite treatment with myself as well as orthopedic she is continuing to have pain and did decide to order an MRI for possible surgical planning to rule out tearing of the posterior tibial or plantar fascia. -In the meantime continue the Tri-Lock ankle brace, ice, elevation.  Procedure: Injection Tendon/Ligament Discussed alternatives, risks, complications and verbal consent was obtained.  Location: Right plantar fascia at the glabrous junction; medial approach. Skin Prep: Alcohol. Injectate: 0.5cc 0.5% marcaine plain, 0.5 cc 2% lidocaine plain and, 1 cc kenalog 10. Disposition: Patient tolerated procedure well. Injection site dressed with a band-aid.  Post-injection care was discussed and return precautions discussed.    Trula Slade DPM  -Patient encouraged to call the office with any questions,  concerns, change in symptoms.

## 2022-02-09 ENCOUNTER — Ambulatory Visit (INDEPENDENT_AMBULATORY_CARE_PROVIDER_SITE_OTHER): Payer: Medicare Other

## 2022-02-09 DIAGNOSIS — M65871 Other synovitis and tenosynovitis, right ankle and foot: Secondary | ICD-10-CM | POA: Diagnosis not present

## 2022-02-09 DIAGNOSIS — M25571 Pain in right ankle and joints of right foot: Secondary | ICD-10-CM

## 2022-02-09 DIAGNOSIS — M722 Plantar fascial fibromatosis: Secondary | ICD-10-CM | POA: Diagnosis not present

## 2022-02-09 DIAGNOSIS — M25471 Effusion, right ankle: Secondary | ICD-10-CM | POA: Diagnosis not present

## 2022-02-09 DIAGNOSIS — S86011A Strain of right Achilles tendon, initial encounter: Secondary | ICD-10-CM | POA: Diagnosis not present

## 2022-02-09 DIAGNOSIS — M76821 Posterior tibial tendinitis, right leg: Secondary | ICD-10-CM

## 2022-02-10 NOTE — Progress Notes (Deleted)
[place] [facility] [address], [place], [place] [address] [phone]  Date:  02/13/2022   Name:  Stephanie Parks   DOB:  1946/02/12   MRN:  [id number]  PCP:  [name], MD    Chief Complaint: No chief complaint on file.   History of Present Illness:  Stephanie Parks is a 76 y.o. very pleasant female patient who presents with the following:  Stephanie Parks is seen today for follow-up from the emergency department History of hypertension, hyperlipidemia, allergic rhinitis, OSA on CPAP. She has tended to have frequent ER visits for chest pain likely related to anxiety She also had [id number]-[id number] complicated by pulmonary embolism January of 2022 -She has completed treatment with Eliquis and is no longer using oxygen.  Lumbar decompression 1998 She was seen here last week on 12/20 with concern of shortness of breath, abdominal pain She had evaluation in the ER which was reassuring, she was released to home She did have a positive D-dimer, CT angiogram was negative She also had a CT abdomen pelvis which was reassuring  Patient Active Problem List   Diagnosis Date Noted   Status post total replacement of left hip 09/04/2021   DJD (degenerative joint disease) 09/04/2021   Iron deficiency anemia 03/13/2021   Erythropoietin deficiency anemia 03/13/2021   Unilateral primary osteoarthritis, left hip 10/18/2020   Greater trochanteric bursitis of left hip 10/18/2020   Headache 06/07/2020   Acute respiratory failure with hypoxia ([place]) 05/02/2020   Post-COVID chronic dyspnea 05/02/2020   Pulsatile neck mass 04/26/2020   Pneumonia due to COVID-19 virus 03/12/2020   Headache disorder 01/04/2020   Seasonal allergic conjunctivitis 10/04/2019   Dysfunction of right eustachian tube 10/04/2019   Pre-diabetes 06/02/2019   Seasonal and perennial allergic rhinitis 12/22/2018   Heart palpitations 12/22/2018   History of chest pain 07/20/2018    Brain aneurysm 07/20/2018   Dyslipidemia 05/04/2018   Tendonitis, Achilles, right 12/08/2017   Unilateral primary osteoarthritis, left knee 10/07/2017   Ingrown toenail 08/10/2017   Coughing 04/24/2017   CAD (coronary artery disease) 03/01/2015   Aneurysm ([place]) 03/01/2015   Dizziness 03/01/2015   Paresthesia of arm 03/01/2015   Achalasia 09/28/2012   CN (constipation) 09/28/2012   Cephalalgia 08/24/2012   Essential hypertension 04/19/2011   Hyperlipidemia 04/19/2011   GERD (gastroesophageal reflux disease) 04/19/2011   Chest pain 04/04/2011    Past Medical History:  Diagnosis Date   Achalasia 09/28/2012   Allergic rhinitis 03/01/2015   Anemia    Aneurysm ([place]) 03/01/2015   stable 5 mm periopthalmic right ICA aneurysm 03/25/21 CTA   BP (high blood pressure) 03/01/2015   Bronchitis 04/23/2018   Cephalalgia 08/24/2012   Overview:  ICD-10 cut over     Chest pain 04/04/2011   Coronary Ca Score 0, no significant CAD 08/20/18; CP with troponin 4>32 s/p LHC showing normal coronaries 12/29/18   CN (constipation) 09/28/2012   Coughing 04/24/2017   Decreased potassium in the blood 03/01/2015   Diverticulosis    Dizziness 03/01/2015   Erythropoietin deficiency anemia 03/13/2021   GERD (gastroesophageal reflux disease)    Hiatal hernia    Hypercholesterolemia 03/01/2015   Hyperlipidemia    Hypertension    Ingrown toenail 08/10/2017   Inguinal hernia    Iron deficiency anemia 03/13/2021   Migraine headache    Onychomycosis 12/08/2017   Paresthesia of arm 03/01/2015   PE (pulmonary thromboembolism) ([place]) 03/13/2020   in setting  of + COVID-19, s/p Eliquis x 3 month   Schatzki's ring    Tendonitis, Achilles, right 12/08/2017   Unilateral primary osteoarthritis, left knee 10/07/2017    Past Surgical History:  Procedure Laterality Date   ABDOMINAL HYSTERECTOMY     BACK SURGERY     x 3   CARDIAC CATHETERIZATION  12/29/2018   angiographically normal coronary arteries 12/29/18  ([place])   CARPAL TUNNEL RELEASE     left wrist   CERVICAL SPINE SURGERY     HEMORRHOID SURGERY     TOTAL HIP ARTHROPLASTY Left 09/04/2021   Procedure: LEFT TOTAL HIP ARTHROPLASTY ANTERIOR APPROACH;  Surgeon: Mcarthur Rossetti, MD;  Location: [facility];  Service: Orthopedics;  Laterality: Left;    Social History   Tobacco Use   Smoking status: Former    Packs/day: 0.50    Years: 20.00    Total pack years: 10.00    Types: Cigarettes    Quit date: 02/19/1984    Years since quitting: 38.0   Smokeless tobacco: Never  Vaping Use   Vaping Use: Never used  Substance Use Topics   Alcohol use: No   Drug use: No    Family History  Problem Relation Age of Onset   Allergic rhinitis Sister    Diabetes Sister    Hypertension Sister    Heart attack Father 21   Heart disease Father    Stomach cancer Maternal Grandmother    Heart disease Mother    Colon cancer Neg Hx    Angioedema Neg Hx    Asthma Neg Hx    Eczema Neg Hx    Urticaria Neg Hx    Immunodeficiency Neg Hx    Breast cancer Neg Hx    Migraines Neg Hx    Headache Neg Hx     Allergies  Allergen Reactions   Shellfish Allergy Hives and Swelling   Ace Inhibitors Other (See Comments)    Pt cannot recall her reaction but tolerates arb    Gabapentin Other (See Comments)    headaches   Pitavastatin     Unknown reaction    Sulfa Antibiotics Other (See Comments) and Rash    Fine bumps   Topiramate Palpitations    Heart race,     Medication list has been reviewed and updated.  Current Outpatient Medications on File Prior to Visit  Medication Sig Dispense Refill   acetaminophen (TYLENOL) 325 MG tablet Take 650 mg by mouth every 6 (six) hours as needed for moderate pain or headache.     aspirin 81 MG chewable tablet Chew 1 tablet (81 mg total) by mouth 2 (two) times daily. 30 tablet 0   Calcium Carb-Cholecalciferol (CALCIUM 600 + D PO) Take 1 tablet by mouth daily.     carboxymethylcellulose (REFRESH PLUS) 0.5 %  SOLN Place 1 drop into both eyes 3 (three) times daily as needed (dry eyes).     ELDERBERRY PO Take 1 capsule by mouth daily.     EPINEPHrine (EPIPEN 2-PAK) 0.3 mg/0.3 mL IJ SOAJ injection Inject 0.3 mg into the muscle as needed for anaphylaxis. Per allergen immunotherapy protocol 1 each 2   fluticasone (FLONASE) 50 MCG/ACT nasal spray Place 2 sprays into both nostrils daily as needed for allergies. 48 g 3   KLOR-CON M20 20 MEQ tablet TAKE 2 TABLETS BY MOUTH EVERY DAY 180 tablet 1   losartan (COZAAR) 100 MG tablet Take 50 mg by mouth daily. Pt takes 50 mg 1/2 tablet once a  day.     MAGNESIUM PO Take 1 tablet by mouth daily.     Multiple Vitamins-Minerals (ALIVE MULTI-VITAMIN PO) Take 1 tablet by mouth daily.     NON FORMULARY Pt uses a cpap nightly     nortriptyline (PAMELOR) 25 MG capsule Take 2 capsules (50 mg total) by mouth at bedtime. 180 capsule 3   omeprazole (PRILOSEC) 40 MG capsule Take 40 mg by mouth daily.     polyethylene glycol (MIRALAX) 17 g packet Take 17 g by mouth daily. 14 each 0   psyllium (METAMUCIL) 0.52 g capsule Take 1 capsule (0.52 g total) by mouth daily. 30 capsule 0   rosuvastatin (CRESTOR) 10 MG tablet TAKE 1 TABLET BY MOUTH EVERY DAY 90 tablet 3   senna-docusate (SENOKOT-S) 8.6-50 MG tablet Take 1 tablet by mouth daily. 30 tablet 0   spironolactone (ALDACTONE) 25 MG tablet Take 12.5 mg by mouth daily. Pt takes 1/2 tablet 12.5 mg daily.     zinc gluconate 50 MG tablet Take 50 mg by mouth daily.     No current facility-administered medications on file prior to visit.    Review of Systems:  As per HPI- otherwise negative.   Physical Examination: There were no vitals filed for this visit. There were no vitals filed for this visit. There is no height or weight on file to calculate BMI. Ideal Body Weight:    GEN: no acute distress. HEENT: Atraumatic, Normocephalic.  Ears and Nose: No external deformity. CV: RRR, No M/G/R. No JVD. No thrill. No extra heart  sounds. PULM: CTA B, no wheezes, crackles, rhonchi. No retractions. No resp. distress. No accessory muscle use. ABD: S, NT, ND, +BS. No rebound. No HSM. EXTR: No c/c/e PSYCH: Normally interactive. Conversant.    Assessment and Plan: ***  Signed Lamar Blinks, MD

## 2022-02-13 ENCOUNTER — Ambulatory Visit: Payer: Medicare Other | Admitting: Family Medicine

## 2022-02-13 ENCOUNTER — Encounter: Payer: Self-pay | Admitting: Orthopaedic Surgery

## 2022-02-13 ENCOUNTER — Ambulatory Visit (INDEPENDENT_AMBULATORY_CARE_PROVIDER_SITE_OTHER): Payer: Medicare Other

## 2022-02-13 ENCOUNTER — Ambulatory Visit: Payer: Medicare Other | Admitting: Orthopaedic Surgery

## 2022-02-13 DIAGNOSIS — Z96642 Presence of left artificial hip joint: Secondary | ICD-10-CM

## 2022-02-13 DIAGNOSIS — M25511 Pain in right shoulder: Secondary | ICD-10-CM | POA: Diagnosis not present

## 2022-02-13 DIAGNOSIS — G8929 Other chronic pain: Secondary | ICD-10-CM | POA: Diagnosis not present

## 2022-02-13 MED ORDER — LIDOCAINE HCL 1 % IJ SOLN
3.0000 mL | INTRAMUSCULAR | Status: AC | PRN
  Administered 2022-02-13: 3 mL

## 2022-02-13 MED ORDER — METHYLPREDNISOLONE ACETATE 40 MG/ML IJ SUSP
40.0000 mg | INTRAMUSCULAR | Status: AC | PRN
  Administered 2022-02-13: 40 mg via INTRA_ARTICULAR

## 2022-02-13 NOTE — Progress Notes (Signed)
The patient comes in today now over 5 months status post a left total hip arthroplasty.  She says the left hip is doing great.  She is 76 years old and has been active.  She does see Dr. Tamera Punt with [facility] for her right shoulder.  She has had a steroid injection in that shoulder before back in the earlier part of the year.  She is requesting [place] to see her for that shoulder today since she is here.  Her right shoulder moves smoothly and fluidly but does have pain in the subacromial outlet.  There is only some slight weakness of the rotator cuff.  Her left operative hip moves smoothly and fluidly.  Her leg lengths are equal.  She walks with no assistive device and no significant limp.  3 views of the right shoulder show no acute findings.  The shoulder is well located.  An AP pelvis and lateral of the left hip shows a well-seated total hip arthroplasty with no complicating features.  From the standpoint of her left hip, follow-up can be as needed.  I did offer a steroid injection in her right shoulder per her request she agreed to this and tolerated it well.  She says she will follow-up with Dr. Tamera Punt for her shoulder if this worsens.  All question concerns were answered and addressed.     Procedure Note  Patient: Stephanie Parks             Date of Birth: Jan 10, 1946           MRN: [id number]             Visit Date: 02/13/2022  Procedures: Visit Diagnoses:  1. Chronic right shoulder pain   2. Status post total replacement of left hip     Large Joint Inj: R subacromial bursa on 02/13/2022 10:53 AM Indications: pain and diagnostic evaluation Details: 22 G 1.5 in needle  Arthrogram: No  Medications: 3 mL lidocaine 1 %; 40 mg methylPREDNISolone acetate 40 MG/ML Outcome: tolerated well, no immediate complications Procedure, treatment alternatives, risks and benefits explained, specific risks discussed. Consent was given by the patient. Immediately prior to procedure a time  out was called to verify the correct patient, procedure, equipment, support staff and site/side marked as required. Patient was prepped and draped in the usual sterile fashion.

## 2022-02-20 ENCOUNTER — Ambulatory Visit (INDEPENDENT_AMBULATORY_CARE_PROVIDER_SITE_OTHER): Payer: Medicare Other

## 2022-02-20 DIAGNOSIS — J309 Allergic rhinitis, unspecified: Secondary | ICD-10-CM | POA: Diagnosis not present

## 2022-03-09 DIAGNOSIS — G4733 Obstructive sleep apnea (adult) (pediatric): Secondary | ICD-10-CM | POA: Diagnosis not present

## 2022-03-20 ENCOUNTER — Ambulatory Visit (INDEPENDENT_AMBULATORY_CARE_PROVIDER_SITE_OTHER): Payer: Medicare Other | Admitting: *Deleted

## 2022-03-20 DIAGNOSIS — Z1231 Encounter for screening mammogram for malignant neoplasm of breast: Secondary | ICD-10-CM | POA: Diagnosis not present

## 2022-03-20 DIAGNOSIS — Z780 Asymptomatic menopausal state: Secondary | ICD-10-CM | POA: Diagnosis not present

## 2022-03-20 DIAGNOSIS — Z0000 Encounter for general adult medical examination without abnormal findings: Secondary | ICD-10-CM

## 2022-03-20 NOTE — Patient Instructions (Signed)
Stephanie Parks , Thank you for taking time to come for your Medicare Wellness Visit. I appreciate your ongoing commitment to your health goals. Please review the following plan we discussed and let me know if I can assist you in the future.   These are the goals we discussed:  Goals      Pharmcy Chronic Care Plan     Pharmacist Clinical Goal(s):  Over the next 90 days, patient will verbalize ability to afford treatment regimen achieve adherence to monitoring guidelines and medication adherence to achieve therapeutic efficacy achieve control of migraine headaches as evidenced by decreasing frequency of migraine headaches maintain control of HTN as evidenced by BP <140/90  through collaboration with PharmD and provider.   Interventions: 1:1 collaboration with Copland, Gay Filler, MD regarding development and update of comprehensive plan of care as evidenced by provider attestation and co-signature Inter-disciplinary care team collaboration (see longitudinal plan of care) Comprehensive medication review performed; medication list updated in electronic medical record  Migraine Headaches:  Goal: decrease frequency of headaches to less than 15 days per month Managed by neurology - Amy Lomax Current Therapy:  Nortriptyline '25mg'$  - take 2 capsules = '50mg'$  at bedtime Ubrelvy '100mg'$  - take 1 tablet as needed for headache. May take an additional tablet if needed in 2 hours. No more than 2 tablets in 24 hours Interventions:    Continue current therapy for migraine prevention and treatment Continue to follow up with neurology  Hypertension: Improving;   BP goal <130/80 (pre cardiology)  BP Readings from Last 3 Encounters:  01/10/21 128/60  01/03/21 (!) 155/80  12/31/20 (!) 154/78  Current treatment: Carvedilol 6.'25mg'$  twice a day Interventions:  Discussed blood pressure goals Continue to check blood pressure and heart rate twice per week, record and bring to future appointments Follow up with  cardiologist / clinical pharmacist next week if edema not improved.   Hyperlipidemia / CAD: Taking moderate to high intensity statin. Current treatment: rosuvastatin '10mg'$  daily Interventions:  Continue current regimen Consider checking lipid panel with next labs  Chronic constipation:  Current treatment:  Linzess 6mg daily Previous therapies: Amitiza 836m - patient could not remember why stopped Interventions:  ReCharlesboth Linzess and generic Amitiza are tier 3 or $47/month. Patient in Medicare coverage gap. Called [employer] patient assistance program. Application for Linzess received 01/05/2021 but is still currently in process. They recommended follow up next week.   Osteopenia / low bone density:  Current Therapy:  Calcium + D - '500mg'$  daily Interventions:  Continue to take calcium + D daily - goal is to get '1200mg'$  of calcium daily from supplement and diet Vitamin D goal is to get 1000 IU daily Fall prevention Consider rechecking DEXA  GERD / acid reflux:  Goal: Decrease symptoms of acid reflux Current therapy:  Omeprazole '40mg'$  daily Interventions:  Recommended if she has breakthrough symptoms she can try OTC Pepcid up to twice a day Monitor for symptoms of acid reflux and report to office if experience these symptoms:  A burning sensation in your chest (heartburn), usually after eating, which might be worse at night. Chest pain. Difficulty swallowing. Regurgitation of food or sour liquid. Sensation of a lump in your throat. Continue as planned to f/u with Dr HoOren Beckmann Gastroenterologist   Health Maintenance:  Reviewed vaccination history and discussed benefits of annual flu, COVID bivalent booster, Shingrix, pneumonia and Tdap vaccinations Patient declined annual flu and COVID bivalent booster Consider Tetanus, pneumonia and Shingrix vaccinations in  2023  Patient Goals/Self-Care Activities Over the next 90 days, patient will:  Take  medications as prescribed,  Check blood pressure twice per week, document, and provide at future appointments,  Collaborate with provider on medication access solutions, and  Work with neurologist regarding migraine headaches   Follow Up Plan: Telephone follow up appointment with care management team member scheduled for: 4 to 8 weeks          This is a list of the screening recommended for you and due dates:  Health Maintenance  Topic Date Due   DTaP/Tdap/Td vaccine (1 - Tdap) Never done   Colon Cancer Screening  02/19/2021   Flu Shot  05/19/2022*   Zoster (Shingles) Vaccine (1 of 2) 06/18/2022*   Pneumonia Vaccine (1 - PCV) 03/21/2023*   Medicare Annual Wellness Visit  03/21/2023   DEXA scan (bone density measurement)  Completed   Hepatitis C Screening: USPSTF Recommendation to screen - Ages 30-79 yo.  Completed   HPV Vaccine  Aged Out   COVID-19 Vaccine  Discontinued  *Topic was postponed. The date shown is not the original due date.     Next appointment: Follow up in one year for your annual wellness visit.   Preventive Care 70 Years and Older, Female Preventive care refers to lifestyle choices and visits with your health care provider that can promote health and wellness. What does preventive care include? A yearly physical exam. This is also called an annual well check. Dental exams once or twice a year. Routine eye exams. Ask your health care provider how often you should have your eyes checked. Personal lifestyle choices, including: Daily care of your teeth and gums. Regular physical activity. Eating a healthy diet. Avoiding tobacco and drug use. Limiting alcohol use. Practicing safe sex. Taking low-dose aspirin every day. Taking vitamin and mineral supplements as recommended by your health care provider. What happens during an annual well check? The services and screenings done by your health care provider during your annual well check will depend on your age,  overall health, lifestyle risk factors, and family history of disease. Counseling  Your health care provider may ask you questions about your: Alcohol use. Tobacco use. Drug use. Emotional well-being. Home and relationship well-being. Sexual activity. Eating habits. History of falls. Memory and ability to understand (cognition). Work and work Statistician. Reproductive health. Screening  You may have the following tests or measurements: Height, weight, and BMI. Blood pressure. Lipid and cholesterol levels. These may be checked every 5 years, or more frequently if you are over 3 years old. Skin check. Lung cancer screening. You may have this screening every year starting at age 32 if you have a 30-pack-year history of smoking and currently smoke or have quit within the past 15 years. Fecal occult blood test (FOBT) of the stool. You may have this test every year starting at age 62. Flexible sigmoidoscopy or colonoscopy. You may have a sigmoidoscopy every 5 years or a colonoscopy every 10 years starting at age 42. Hepatitis C blood test. Hepatitis B blood test. Sexually transmitted disease (STD) testing. Diabetes screening. This is done by checking your blood sugar (glucose) after you have not eaten for a while (fasting). You may have this done every 1-3 years. Bone density scan. This is done to screen for osteoporosis. You may have this done starting at age 17. Mammogram. This may be done every 1-2 years. Talk to your health care provider about how often you should have regular mammograms. Talk  with your health care provider about your test results, treatment options, and if necessary, the need for more tests. Vaccines  Your health care provider may recommend certain vaccines, such as: Influenza vaccine. This is recommended every year. Tetanus, diphtheria, and acellular pertussis (Tdap, Td) vaccine. You may need a Td booster every 10 years. Zoster vaccine. You may need this after age  57. Pneumococcal 13-valent conjugate (PCV13) vaccine. One dose is recommended after age 40. Pneumococcal polysaccharide (PPSV23) vaccine. One dose is recommended after age 70. Talk to your health care provider about which screenings and vaccines you need and how often you need them. This information is not intended to replace advice given to you by your health care provider. Make sure you discuss any questions you have with your health care provider. Document Released: 03/03/2015 Document Revised: 10/25/2015 Document Reviewed: 12/06/2014 Elsevier Interactive Patient Education  2017 [place] Prevention in the Home Falls can cause injuries. They can happen to people of all ages. There are many things you can do to make your home safe and to help prevent falls. What can I do on the outside of my home? Regularly fix the edges of walkways and driveways and fix any cracks. Remove anything that might make you trip as you walk through a door, such as a raised step or threshold. Trim any bushes or trees on the path to your home. Use bright outdoor lighting. Clear any walking paths of anything that might make someone trip, such as rocks or tools. Regularly check to see if handrails are loose or broken. Make sure that both sides of any steps have handrails. Any raised decks and porches should have guardrails on the edges. Have any leaves, snow, or ice cleared regularly. Use sand or salt on walking paths during winter. Clean up any spills in your garage right away. This includes oil or grease spills. What can I do in the bathroom? Use night lights. Install grab bars by the toilet and in the tub and shower. Do not use towel bars as grab bars. Use non-skid mats or decals in the tub or shower. If you need to sit down in the shower, use a plastic, non-slip stool. Keep the floor dry. Clean up any water that spills on the floor as soon as it happens. Remove soap buildup in the tub or shower  regularly. Attach bath mats securely with double-sided non-slip rug tape. Do not have throw rugs and other things on the floor that can make you trip. What can I do in the bedroom? Use night lights. Make sure that you have a light by your bed that is easy to reach. Do not use any sheets or blankets that are too big for your bed. They should not hang down onto the floor. Have a firm chair that has side arms. You can use this for support while you get dressed. Do not have throw rugs and other things on the floor that can make you trip. What can I do in the kitchen? Clean up any spills right away. Avoid walking on wet floors. Keep items that you use a lot in easy-to-reach places. If you need to reach something above you, use a strong step stool that has a grab bar. Keep electrical cords out of the way. Do not use floor polish or wax that makes floors slippery. If you must use wax, use non-skid floor wax. Do not have throw rugs and other things on the floor that can make you  trip. What can I do with my stairs? Do not leave any items on the stairs. Make sure that there are handrails on both sides of the stairs and use them. Fix handrails that are broken or loose. Make sure that handrails are as long as the stairways. Check any carpeting to make sure that it is firmly attached to the stairs. Fix any carpet that is loose or worn. Avoid having throw rugs at the top or bottom of the stairs. If you do have throw rugs, attach them to the floor with carpet tape. Make sure that you have a light switch at the top of the stairs and the bottom of the stairs. If you do not have them, ask someone to add them for you. What else can I do to help prevent falls? Wear shoes that: Do not have high heels. Have rubber bottoms. Are comfortable and fit you well. Are closed at the toe. Do not wear sandals. If you use a stepladder: Make sure that it is fully opened. Do not climb a closed stepladder. Make sure that  both sides of the stepladder are locked into place. Ask someone to hold it for you, if possible. Clearly mark and make sure that you can see: Any grab bars or handrails. First and last steps. Where the edge of each step is. Use tools that help you move around (mobility aids) if they are needed. These include: Canes. Walkers. Scooters. Crutches. Turn on the lights when you go into a dark area. Replace any light bulbs as soon as they burn out. Set up your furniture so you have a clear path. Avoid moving your furniture around. If any of your floors are uneven, fix them. If there are any pets around you, be aware of where they are. Review your medicines with your doctor. Some medicines can make you feel dizzy. This can increase your chance of falling. Ask your doctor what other things that you can do to help prevent falls. This information is not intended to replace advice given to you by your health care provider. Make sure you discuss any questions you have with your health care provider. Document Released: 12/01/2008 Document Revised: 07/13/2015 Document Reviewed: 03/11/2014 Elsevier Interactive Patient Education  2017 [employer].

## 2022-03-20 NOTE — Progress Notes (Signed)
Subjective:   Stephanie Parks is a 77 y.o. female who presents for Medicare Annual (Subsequent) preventive examination.  I connected with  [name] on 03/20/22 by a audio enabled telemedicine application and verified that I am speaking with the correct person using two identifiers.  Patient Location: Home  Provider Location: Office/Clinic  I discussed the limitations of evaluation and management by telemedicine. The patient expressed understanding and agreed to proceed.   Review of Systems    Defer to PCP Cardiac Risk Factors include: advanced age (>28mn, >>31women);dyslipidemia;hypertension     Objective:    There were no vitals filed for this visit. There is no height or weight on file to calculate BMI.     03/20/2022   10:22 AM 02/06/2022   12:03 PM 09/29/2021    7:19 PM 08/29/2021    1:13 PM 07/31/2021    2:17 PM 03/25/2021    3:04 PM 12/31/2020    4:19 PM  Advanced Directives  Does Patient Have a Medical Advance Directive? No No No No No No No  Would patient like information on creating a medical advance directive? No - Patient declined No - Patient declined  Yes (MAU/Ambulatory/Procedural Areas - Information given) No - Patient declined      Current Medications (verified) Outpatient Encounter Medications as of 03/20/2022  Medication Sig   acetaminophen (TYLENOL) 325 MG tablet Take 650 mg by mouth every 6 (six) hours as needed for moderate pain or headache.   aspirin 81 MG chewable tablet Chew 1 tablet (81 mg total) by mouth 2 (two) times daily.   Calcium Carb-Cholecalciferol (CALCIUM 600 + D PO) Take 1 tablet by mouth daily.   carboxymethylcellulose (REFRESH PLUS) 0.5 % SOLN Place 1 drop into both eyes 3 (three) times daily as needed (dry eyes).   ELDERBERRY PO Take 1 capsule by mouth daily.   EPINEPHrine (EPIPEN 2-PAK) 0.3 mg/0.3 mL IJ SOAJ injection Inject 0.3 mg into the muscle as needed for anaphylaxis. Per allergen immunotherapy protocol   fluticasone  (FLONASE) 50 MCG/ACT nasal spray Place 2 sprays into both nostrils daily as needed for allergies.   KLOR-CON M20 20 MEQ tablet TAKE 2 TABLETS BY MOUTH EVERY DAY   losartan (COZAAR) 100 MG tablet Take 50 mg by mouth daily. Pt takes 50 mg 1/2 tablet once a day.   MAGNESIUM PO Take 1 tablet by mouth daily.   Multiple Vitamins-Minerals (ALIVE MULTI-VITAMIN PO) Take 1 tablet by mouth daily.   NON FORMULARY Pt uses a cpap nightly   nortriptyline (PAMELOR) 25 MG capsule Take 2 capsules (50 mg total) by mouth at bedtime.   omeprazole (PRILOSEC) 40 MG capsule Take 40 mg by mouth daily.   polyethylene glycol (MIRALAX) 17 g packet Take 17 g by mouth daily.   psyllium (METAMUCIL) 0.52 g capsule Take 1 capsule (0.52 g total) by mouth daily.   rosuvastatin (CRESTOR) 10 MG tablet TAKE 1 TABLET BY MOUTH EVERY DAY   senna-docusate (SENOKOT-S) 8.6-50 MG tablet Take 1 tablet by mouth daily.   spironolactone (ALDACTONE) 25 MG tablet Take 12.5 mg by mouth daily. Pt takes 1/2 tablet 12.5 mg daily.   zinc gluconate 50 MG tablet Take 50 mg by mouth daily.   No facility-administered encounter medications on file as of 03/20/2022.    Allergies (verified) Shellfish allergy, Ace inhibitors, Gabapentin, Pitavastatin, Sulfa antibiotics, and Topiramate   History: Past Medical History:  Diagnosis Date   Achalasia 09/28/2012   Allergic rhinitis 03/01/2015  Anemia    Aneurysm ([place]) 03/01/2015   stable 5 mm periopthalmic right ICA aneurysm 03/25/21 CTA   BP (high blood pressure) 03/01/2015   Bronchitis 04/23/2018   Cephalalgia 08/24/2012   Overview:  ICD-10 cut over     Chest pain 04/04/2011   Coronary Ca Score 0, no significant CAD 08/20/18; CP with troponin 4>32 s/p LHC showing normal coronaries 12/29/18   CN (constipation) 09/28/2012   Coughing 04/24/2017   Decreased potassium in the blood 03/01/2015   Diverticulosis    Dizziness 03/01/2015   Erythropoietin deficiency anemia 03/13/2021   GERD (gastroesophageal  reflux disease)    Hiatal hernia    Hypercholesterolemia 03/01/2015   Hyperlipidemia    Hypertension    Ingrown toenail 08/10/2017   Inguinal hernia    Iron deficiency anemia 03/13/2021   Migraine headache    Onychomycosis 12/08/2017   Paresthesia of arm 03/01/2015   PE (pulmonary thromboembolism) ([place]) 03/13/2020   in setting of + COVID-19, s/p Eliquis x 3 month   Schatzki's ring    Tendonitis, Achilles, right 12/08/2017   Unilateral primary osteoarthritis, left knee 10/07/2017   Past Surgical History:  Procedure Laterality Date   ABDOMINAL HYSTERECTOMY     BACK SURGERY     x 3   CARDIAC CATHETERIZATION  12/29/2018   angiographically normal coronary arteries 12/29/18 ([place])   CARPAL TUNNEL RELEASE     left wrist   CERVICAL SPINE SURGERY     HEMORRHOID SURGERY     TOTAL HIP ARTHROPLASTY Left 09/04/2021   Procedure: LEFT TOTAL HIP ARTHROPLASTY ANTERIOR APPROACH;  Surgeon: Mcarthur Rossetti, MD;  Location: [place];  Service: Orthopedics;  Laterality: Left;   Family History  Problem Relation Age of Onset   Allergic rhinitis Sister    Diabetes Sister    Hypertension Sister    Heart attack Father 65   Heart disease Father    Stomach cancer Maternal Grandmother    Heart disease Mother    Colon cancer Neg Hx    Angioedema Neg Hx    Asthma Neg Hx    Eczema Neg Hx    Urticaria Neg Hx    Immunodeficiency Neg Hx    Breast cancer Neg Hx    Migraines Neg Hx    Headache Neg Hx    Social History   Socioeconomic History   Marital status: Divorced    Spouse name: Not on file   Number of children: 2   Years of education: Not on file   Highest education level: Some college, no degree  Occupational History   Occupation: Surveyor, quantity: [id number]  Tobacco Use   Smoking status: Former    Packs/day: 0.50    Years: 20.00    Total pack years: 10.00    Types: Cigarettes    Quit date: 02/19/1984    Years since quitting: 38.1   Smokeless tobacco: Never  Vaping Use    Vaping Use: Never used  Substance and Sexual Activity   Alcohol use: No   Drug use: No   Sexual activity: Yes    Partners: Male  Other Topics Concern   Not on file  Social History Narrative   Lives alone   Caffeine use: Coffee one cup daily   Right handed    Son is her next of kin   Working at [employer] Strain: Medium Risk (12/24/2020)   Overall Financial Resource Strain ([place])  Difficulty of Paying Living Expenses: Somewhat hard  Food Insecurity: No Food Insecurity (03/20/2022)   Hunger Vital Sign    Worried About Running Out of Food in the Last Year: Never true    Ran Out of Food in the Last Year: Never true  Transportation Needs: No Transportation Needs (03/20/2022)   PRAPARE - Hydrologist (Medical): No    Lack of Transportation (Non-Medical): No  Physical Activity: Inactive (01/21/2021)   Exercise Vital Sign    Days of Exercise per Week: 0 days    Minutes of Exercise per Session: 0 min  Stress: No Stress Concern Present (03/20/2022)   [place]    Feeling of Stress : Not at all  Social Connections: Moderately Integrated (03/20/2022)   Social Connection and Isolation Panel [NHANES]    Frequency of Communication with Friends and Family: More than three times a week    Frequency of Social Gatherings with Friends and Family: Once a week    Attends Religious Services: More than 4 times per year    Active Member of [employer] or Organizations: Yes    Attends Music therapist: More than 4 times per year    Marital Status: Divorced    Tobacco Counseling Counseling given: Not Answered   Clinical Intake:  Pre-visit preparation completed: Yes  Pain : No/denies pain  Diabetes: No  How often do you need to have someone help you when you read instructions, pamphlets, or other written materials from your doctor or  pharmacy?: 1 - Never  Activities of Daily Living    03/20/2022   10:28 AM 08/29/2021    1:16 PM  In your present state of health, do you have any difficulty performing the following activities:  Hearing? 0   Vision? 0   Difficulty concentrating or making decisions? 0   Walking or climbing stairs? 0   Dressing or bathing? 0   Doing errands, shopping? 0 0  Preparing Food and eating ? N   Using the Toilet? N   In the past six months, have you accidently leaked urine? N   Do you have problems with loss of bowel control? N   Managing your Medications? N   Managing your Finances? N   Housekeeping or managing your Housekeeping? N     Patient Care Team: Copland, Gay Filler, MD as PCP - General (Family Medicine) Jettie Booze, MD as PCP - Cardiology (Cardiology) Ashok Pall, MD as Consulting Physician (Neurosurgery) Cherre Robins, RPH-CPP (Pharmacist)  Indicate any recent Medical Services you may have received from other than [facility] providers in the past year (date may be approximate).     Assessment:   This is a routine wellness examination for [facility].  Hearing/Vision screen No results found.  Dietary issues and exercise activities discussed: Current Exercise Habits: The patient does not participate in regular exercise at present (hip replacement 08/2021), Exercise limited by: orthopedic condition(s)   Goals Addressed   None    Depression Screen    03/20/2022   10:27 AM 02/06/2021    2:04 PM 10/03/2020    5:16 PM 04/10/2020    3:49 PM 12/08/2018    4:56 PM 08/20/2017    8:56 AM 08/15/2016    1:17 PM  PHQ 2/9 Scores  PHQ - 2 Score 1 0 0 3 4 0 0  PHQ- 9 Score    6 9      Fall Risk  03/20/2022   10:24 AM 02/06/2022   11:06 AM 02/06/2021    2:04 PM 10/03/2020    5:16 PM 07/12/2020   10:15 AM  Fall Risk   Falls in the past year? 0 0 0 0 0  Number falls in past yr: 0 0 0 0 0  Injury with Fall? 0 0 0 0 0  Risk for fall due to : No Fall Risks No Fall Risks  No  Fall Risks   Follow up Falls evaluation completed Falls evaluation completed Falls evaluation completed Falls evaluation completed     [place]:  Any stairs in or around the home? Yes  If so, are there any without handrails? No  Home free of loose throw rugs in walkways, pet beds, electrical cords, etc? Yes  Adequate lighting in your home to reduce risk of falls? Yes   ASSISTIVE DEVICES UTILIZED TO PREVENT FALLS:  Life alert? No  Use of a cane, walker or w/c? No  Grab bars in the bathroom? No  Shower chair or bench in shower? Yes  Elevated toilet seat or a handicapped toilet? Yes   TIMED UP AND GO:  Was the test performed?  No, audio visit .    Cognitive Function:        03/20/2022   10:34 AM  6CIT Screen  What Year? 0 points  What month? 0 points  What time? 0 points  Count back from 20 2 points  Months in reverse 0 points  Repeat phrase 8 points  Total Score 10 points    Immunizations Immunization History  Administered Date(s) Administered   PFIZER(Purple Top)SARS-COV-2 Vaccination 06/07/2019, 06/30/2019    TDAP status: Due, Education has been provided regarding the importance of this vaccine. Advised may receive this vaccine at local pharmacy or Health Dept. Aware to provide a copy of the vaccination record if obtained from local pharmacy or Health Dept. Verbalized acceptance and understanding.  Flu Vaccine status: Due, Education has been provided regarding the importance of this vaccine. Advised may receive this vaccine at local pharmacy or Health Dept. Aware to provide a copy of the vaccination record if obtained from local pharmacy or Health Dept. Verbalized acceptance and understanding.  Pneumococcal vaccine status: Due, Education has been provided regarding the importance of this vaccine. Advised may receive this vaccine at local pharmacy or Health Dept. Aware to provide a copy of the vaccination record if obtained from local  pharmacy or Health Dept. Verbalized acceptance and understanding.  Covid-19 vaccine status: Declined, Education has been provided regarding the importance of this vaccine but patient still declined. Advised may receive this vaccine at local pharmacy or Health Dept.or vaccine clinic. Aware to provide a copy of the vaccination record if obtained from local pharmacy or Health Dept. Verbalized acceptance and understanding.  Qualifies for Shingles Vaccine? Yes   Zostavax completed No   Shingrix Completed?: No.    Education has been provided regarding the importance of this vaccine. Patient has been advised to call insurance company to determine out of pocket expense if they have not yet received this vaccine. Advised may also receive vaccine at local pharmacy or Health Dept. Verbalized acceptance and understanding.  Screening Tests Health Maintenance  Topic Date Due   DTaP/Tdap/Td (1 - Tdap) Never done   Medicare Annual Wellness (AWV)  08/15/2017   COLONOSCOPY (Pts 45-11yr Insurance coverage will need to be confirmed)  02/19/2021   INFLUENZA VACCINE  05/19/2022 (Originally 09/18/2021)   Zoster  Vaccines- Shingrix (1 of 2) 06/18/2022 (Originally 10/08/1964)   Pneumonia Vaccine 29+ Years old (1 - PCV) 03/21/2023 (Originally 10/09/2010)   DEXA SCAN  Completed   Hepatitis C Screening  Completed   HPV VACCINES  Aged Out   COVID-19 Vaccine  Discontinued    Health Maintenance  Health Maintenance Due  Topic Date Due   DTaP/Tdap/Td (1 - Tdap) Never done   Medicare Annual Wellness (AWV)  08/15/2017   COLONOSCOPY (Pts 45-16yr Insurance coverage will need to be confirmed)  02/19/2021    Colorectal cancer screening: Type of screening: Colonoscopy. Completed 02/19/18. Repeat every 3 years  Mammogram status: Completed 10/02/20. Repeat every year  Bone Density status: Ordered 03/20/22. Pt provided with contact info and advised to call to schedule appt.  Lung Cancer Screening: (Low Dose CT Chest recommended  if Age 77-80years, 30 pack-year currently smoking OR have quit w/in 15years.) does not qualify.     Additional Screening:  Hepatitis C Screening: does qualify; Completed 03/12/20  Vision Screening: Recommended annual ophthalmology exams for early detection of glaucoma and other disorders of the eye. Is the patient up to date with their annual eye exam?  Yes  Who is the provider or what is the name of the office in which the patient attends annual eye exams? Doesn't remember name If pt is not established with a provider, would they like to be referred to a provider to establish care? No .   Dental Screening: Recommended annual dental exams for proper oral hygiene  Community Resource Referral / Chronic Care Management: CRR required this visit?  No   CCM required this visit?  No      Plan:     I have personally reviewed and noted the following in the patient's chart:   Medical and social history Use of alcohol, tobacco or illicit drugs  Current medications and supplements including opioid prescriptions. Patient is not currently taking opioid prescriptions. Functional ability and status Nutritional status Physical activity Advanced directives List of other physicians Hospitalizations, surgeries, and ER visits in previous 12 months Vitals Screenings to include cognitive, depression, and falls Referrals and appointments  In addition, I have reviewed and discussed with patient certain preventive protocols, quality metrics, and best practice recommendations. A written personalized care plan for preventive services as well as general preventive health recommendations were provided to patient.   Due to this being a telephonic visit, the after visit summary with patients personalized plan was offered to patient via mail or my-chart. Patient would like to access on my-chart.  BBeatris Ship [place]  03/20/2022   Nurse Notes: None

## 2022-03-20 NOTE — Progress Notes (Unsigned)
PATIENT: Stephanie Parks DOB: 1945/03/21  REASON FOR VISIT: follow up HISTORY FROM: patient  No chief complaint on file.    HISTORY OF PRESENT ILLNESS:  03/20/22 ALL:  Stephanie Parks returns for follow up for OSA on CPAP and migraines. She was last seen 03/2021 and migraines were well controlled on nortriptyline '50mg'$  QHS.   She is using CPAP nightly for about   She was referred to [facility] for optic neuropathy in setting of carotid aneurysm. CTA showed stable over bast 8 years. 4-52m.   03/21/2021 ALL: MMARTESHA NIEDERMEIERis a 77y.o. female here today for follow up for OSA on CPAP.  Her compliance for the last 30 days demonstrates 100% compliance for days used and 100% compliance for >4 hours. Her AHI is 0.9 and she does have a leak of 59.6 L/min, however she doesn't have any concerns with it. She reports having issues with getting her mask detached from her headgear.   We started nortriptyline '50mg'$  at bedtime at last visit 10/2020. She is not taking the Emgality or URoselyn Meieras it was not covered.  She reports that she has been doing much better with this and has not been having any headaches. She doesn't have any issues with missing medications or concerns for side effects. She does report that she has an episode of pain in her eyes last week, sudden sharp pain in one eye that instantly resolved, then it happened again two days later in the other eye. She visited her eye doctor and PCP since. No obvious triggers. No recurrence.      HISTORY: (copied from previous note)  11/15/2020 ALL: MChaquettareturns for worsening headaches. We started her on Ajovy at last visit in 07/2020. She had first injection in the office. Insurance would not cover and she was switched to [employer]with first injection around 09/16/2020. She called 09/28/2020 reporting that Emgality was not helping and migraines were worsening. URoselyn Meierwas very expensive and not effective. Steroid taper was called in at that time. She called  9/26 reporting cost of Emgality and URoselyn Meierwas too expensive and wished to schedule follow up for change in therapy. She feels that [place] effective.    She was seen in the ER 11/07/2020 for dizziness, headache and hypertension. She had reported a sudden onset of intractable headache following a dilated eye exam that day. BP was > 2734systolic. She was treated with hydralazine. Follow up BP 159/82. Patient reportedly taking losartan '100mg'$  daily and metoprolol ER '25mg'$  (taking 1/2 tablet-12.'5mg'$ ). CT was negative. Blood work unremarkable. She was advised to keep BP log and follow up with PCP. She has a BP log with her today showing readings from 1193'[id number]-902'Isystolic. She has an appt with her PCP today to discuss.    I have reviewed notes from previous neurologist. She seemed to do the best on nortriptyline. She did try this medication for about 2-3 weeks earlier this year but did not feel it was effective. She admits that anxiety could be contributing to symptoms. She has been hesitant to try sumatriptan due to potential side effects.    Patient has tried and failed: Preventative: Ajovy (not covered by insurance), Emgality (ineffective and too expensive), Topiramate (palpitations), metoprolol (on now), gabapentin (caused headaches), nortriptyline (ineffective, history of constipation and dry mouth)   Abortive: Cambia, Tylenol   MM 05/19/19: Ms. JFonneris a 77year old female with a history of obstructive sleep apnea on CPAP.  Her download indicates  that she used her machine 25 out of 30 days for compliance of 83%.  She used her machine greater than 4 hours 24 days for compliance of 80%.  On average she uses her machine 6 hours and 34 minutes.  Residual AHI is 2.8 on 7 cm of water with EPR 3.  Leak in the 95th percentile is 26.1 L/min.She reports that she does see the benefit.  Reports that she is no longer having heart palpitations at night.  REVIEW OF SYSTEMS: Out of a complete 14 system  review of symptoms, the patient complains only of the following symptoms, eye pain and all other reviewed systems are negative.  ESS: 4  ALLERGIES: Allergies  Allergen Reactions   Shellfish Allergy Hives and Swelling   Ace Inhibitors Other (See Comments)    Pt cannot recall her reaction but tolerates arb    Gabapentin Other (See Comments)    headaches   Pitavastatin     Unknown reaction    Sulfa Antibiotics Other (See Comments) and Rash    Fine bumps   Topiramate Palpitations    Heart race,     HOME MEDICATIONS: Outpatient Medications Prior to Visit  Medication Sig Dispense Refill   acetaminophen (TYLENOL) 325 MG tablet Take 650 mg by mouth every 6 (six) hours as needed for moderate pain or headache.     aspirin 81 MG chewable tablet Chew 1 tablet (81 mg total) by mouth 2 (two) times daily. 30 tablet 0   Calcium Carb-Cholecalciferol (CALCIUM 600 + D PO) Take 1 tablet by mouth daily.     carboxymethylcellulose (REFRESH PLUS) 0.5 % SOLN Place 1 drop into both eyes 3 (three) times daily as needed (dry eyes).     ELDERBERRY PO Take 1 capsule by mouth daily.     EPINEPHrine (EPIPEN 2-PAK) 0.3 mg/0.3 mL IJ SOAJ injection Inject 0.3 mg into the muscle as needed for anaphylaxis. Per allergen immunotherapy protocol 1 each 2   fluticasone (FLONASE) 50 MCG/ACT nasal spray Place 2 sprays into both nostrils daily as needed for allergies. 48 g 3   KLOR-CON M20 20 MEQ tablet TAKE 2 TABLETS BY MOUTH EVERY DAY 180 tablet 1   losartan (COZAAR) 100 MG tablet Take 50 mg by mouth daily. Pt takes 50 mg 1/2 tablet once a day.     MAGNESIUM PO Take 1 tablet by mouth daily.     Multiple Vitamins-Minerals (ALIVE MULTI-VITAMIN PO) Take 1 tablet by mouth daily.     NON FORMULARY Pt uses a cpap nightly     nortriptyline (PAMELOR) 25 MG capsule Take 2 capsules (50 mg total) by mouth at bedtime. 180 capsule 3   omeprazole (PRILOSEC) 40 MG capsule Take 40 mg by mouth daily.     polyethylene glycol (MIRALAX) 17  g packet Take 17 g by mouth daily. 14 each 0   psyllium (METAMUCIL) 0.52 g capsule Take 1 capsule (0.52 g total) by mouth daily. 30 capsule 0   rosuvastatin (CRESTOR) 10 MG tablet TAKE 1 TABLET BY MOUTH EVERY DAY 90 tablet 3   senna-docusate (SENOKOT-S) 8.6-50 MG tablet Take 1 tablet by mouth daily. 30 tablet 0   spironolactone (ALDACTONE) 25 MG tablet Take 12.5 mg by mouth daily. Pt takes 1/2 tablet 12.5 mg daily.     zinc gluconate 50 MG tablet Take 50 mg by mouth daily.     No facility-administered medications prior to visit.    PAST MEDICAL HISTORY: Past Medical History:  Diagnosis Date  Achalasia 09/28/2012   Allergic rhinitis 03/01/2015   Anemia    Aneurysm ([place]) 03/01/2015   stable 5 mm periopthalmic right ICA aneurysm 03/25/21 CTA   BP (high blood pressure) 03/01/2015   Bronchitis 04/23/2018   Cephalalgia 08/24/2012   Overview:  ICD-10 cut over     Chest pain 04/04/2011   Coronary Ca Score 0, no significant CAD 08/20/18; CP with troponin 4>32 s/p LHC showing normal coronaries 12/29/18   CN (constipation) 09/28/2012   Coughing 04/24/2017   Decreased potassium in the blood 03/01/2015   Diverticulosis    Dizziness 03/01/2015   Erythropoietin deficiency anemia 03/13/2021   GERD (gastroesophageal reflux disease)    Hiatal hernia    Hypercholesterolemia 03/01/2015   Hyperlipidemia    Hypertension    Ingrown toenail 08/10/2017   Inguinal hernia    Iron deficiency anemia 03/13/2021   Migraine headache    Onychomycosis 12/08/2017   Paresthesia of arm 03/01/2015   PE (pulmonary thromboembolism) ([place]) 03/13/2020   in setting of + COVID-19, s/p Eliquis x 3 month   Schatzki's ring    Tendonitis, Achilles, right 12/08/2017   Unilateral primary osteoarthritis, left knee 10/07/2017    PAST SURGICAL HISTORY: Past Surgical History:  Procedure Laterality Date   ABDOMINAL HYSTERECTOMY     BACK SURGERY     x 3   CARDIAC CATHETERIZATION  12/29/2018   angiographically normal  coronary arteries 12/29/18 ([place])   CARPAL TUNNEL RELEASE     left wrist   CERVICAL SPINE SURGERY     HEMORRHOID SURGERY     TOTAL HIP ARTHROPLASTY Left 09/04/2021   Procedure: LEFT TOTAL HIP ARTHROPLASTY ANTERIOR APPROACH;  Surgeon: Mcarthur Rossetti, MD;  Location: [facility];  Service: Orthopedics;  Laterality: Left;    FAMILY HISTORY: Family History  Problem Relation Age of Onset   Allergic rhinitis Sister    Diabetes Sister    Hypertension Sister    Heart attack Father 87   Heart disease Father    Stomach cancer Maternal Grandmother    Heart disease Mother    Colon cancer Neg Hx    Angioedema Neg Hx    Asthma Neg Hx    Eczema Neg Hx    Urticaria Neg Hx    Immunodeficiency Neg Hx    Breast cancer Neg Hx    Migraines Neg Hx    Headache Neg Hx     SOCIAL HISTORY: Social History   Socioeconomic History   Marital status: Divorced    Spouse name: Not on file   Number of children: 2   Years of education: Not on file   Highest education level: Some college, no degree  Occupational History   Occupation: Surveyor, quantity: [id number]  Tobacco Use   Smoking status: Former    Packs/day: 0.50    Years: 20.00    Total pack years: 10.00    Types: Cigarettes    Quit date: 02/19/1984    Years since quitting: 38.1   Smokeless tobacco: Never  Vaping Use   Vaping Use: Never used  Substance and Sexual Activity   Alcohol use: No   Drug use: No   Sexual activity: Yes    Partners: Male  Other Topics Concern   Not on file  Social History Narrative   Lives alone   Caffeine use: Coffee one cup daily   Right handed    Son is her next of kin   Working at [employer]   Financial Resource Strain: Medium Risk (12/24/2020)   Overall Financial Resource Strain (CARDIA)    Difficulty of Paying Living Expenses: Somewhat hard  Food Insecurity: No Food Insecurity (03/20/2022)   Hunger Vital Sign    Worried About Running Out of Food in the Last  Year: Never true    Ran Out of Food in the Last Year: Never true  Transportation Needs: No Transportation Needs (03/20/2022)   PRAPARE - Hydrologist (Medical): No    Lack of Transportation (Non-Medical): No  Physical Activity: Inactive (01/21/2021)   Exercise Vital Sign    Days of Exercise per Week: 0 days    Minutes of Exercise per Session: 0 min  Stress: No Stress Concern Present (03/20/2022)   [place]    Feeling of Stress : Not at all  Social Connections: Moderately Integrated (03/20/2022)   Social Connection and Isolation Panel [NHANES]    Frequency of Communication with Friends and Family: More than three times a week    Frequency of Social Gatherings with Friends and Family: Once a week    Attends Religious Services: More than 4 times per year    Active Member of [employer] or Organizations: Yes    Attends Music therapist: More than 4 times per year    Marital Status: Divorced  Intimate Partner Violence: Not At Risk (03/20/2022)   Humiliation, Afraid, Rape, and Kick questionnaire    Fear of Current or Ex-Partner: No    Emotionally Abused: No    Physically Abused: No    Sexually Abused: No     PHYSICAL EXAM  There were no vitals filed for this visit.  There is no height or weight on file to calculate BMI.  Generalized: Well developed, in no acute distress  Cardiology: normal rate and rhythm, no murmur noted Respiratory: clear to auscultation bilaterally  Neurological examination  Mentation: Alert oriented to time, place, history taking. Follows all commands speech and language fluent Cranial nerve II-XII: Pupils were equal round reactive to light. Extraocular movements were full, visual field were full  Motor: The motor testing reveals 5 over 5 strength of all 4 extremities. Good symmetric motor tone is noted throughout.  Gait and station: Gait is normal.     DIAGNOSTIC DATA (LABS, IMAGING, TESTING) - I reviewed patient records, labs, notes, testing and imaging myself where available.      No data to display           Lab Results  Component Value Date   WBC 8.0 02/06/2022   HGB 11.5 (L) 02/06/2022   HCT 35.3 (L) 02/06/2022   MCV 90.1 02/06/2022   PLT 309 02/06/2022      Component Value Date/Time   NA 138 02/06/2022 1415   NA 142 12/25/2021 1001   K 3.8 02/06/2022 1415   CL 103 02/06/2022 1415   CO2 29 02/06/2022 1415   GLUCOSE 98 02/06/2022 1415   BUN 14 02/06/2022 1415   BUN 16 12/25/2021 1001   CREATININE 0.92 02/06/2022 1415   CREATININE 0.92 03/12/2021 1332   CREATININE 0.85 02/23/2021 1434   CALCIUM 9.2 02/06/2022 1415   PROT 7.5 02/06/2022 1415   PROT 6.2 07/28/2018 0756   ALBUMIN 3.4 (L) 02/06/2022 1415   ALBUMIN 4.0 07/28/2018 0756   AST 20 02/06/2022 1415   AST 20 03/12/2021 1332   ALT 15 02/06/2022 1415   ALT 13 03/12/2021 1332  ALKPHOS 87 02/06/2022 1415   BILITOT 0.3 02/06/2022 1415   BILITOT 0.3 03/12/2021 1332   GFRNONAA >60 02/06/2022 1415   GFRNONAA >60 03/12/2021 1332   GFRAA 82 05/18/2020 0000   Lab Results  Component Value Date   CHOL 161 06/02/2019   HDL 56.00 06/02/2019   LDLCALC 87 06/02/2019   TRIG 98 03/12/2020   CHOLHDL 3 06/02/2019   Lab Results  Component Value Date   HGBA1C 5.9 (H) 02/23/2021   Lab Results  Component Value Date   VITAMINB12 297 10/03/2021   Lab Results  Component Value Date   TSH 2.520 12/25/2021     ASSESSMENT AND PLAN 77 y.o. year old female  has a past medical history of Achalasia (09/28/2012), Allergic rhinitis (03/01/2015), Anemia, Aneurysm ([place]) (03/01/2015), BP (high blood pressure) (03/01/2015), Bronchitis (04/23/2018), Cephalalgia (08/24/2012), Chest pain (04/04/2011), CN (constipation) (09/28/2012), Coughing (04/24/2017), Decreased potassium in the blood (03/01/2015), Diverticulosis, Dizziness (03/01/2015), Erythropoietin deficiency anemia  (03/13/2021), GERD (gastroesophageal reflux disease), Hiatal hernia, Hypercholesterolemia (03/01/2015), Hyperlipidemia, Hypertension, Ingrown toenail (08/10/2017), Inguinal hernia, Iron deficiency anemia (03/13/2021), Migraine headache, Onychomycosis (12/08/2017), Paresthesia of arm (03/01/2015), PE (pulmonary thromboembolism) ([place]) (03/13/2020), Schatzki's ring, Tendonitis, Achilles, right (12/08/2017), and Unilateral primary osteoarthritis, left knee (10/07/2017). here with   No diagnosis found.    Stephanie Parks is doing well on CPAP therapy. Compliance report reveals 100% compliance. She was encouraged to continue using CPAP nightly and for greater than 4 hours each night. Patient will watch her leak at home and if she begins to have any concerns with it she will reach out to [place]. We encouraged the patient to reach out to her DME company about her issues with the headgear. We will update supply orders as indicated. Risks of untreated sleep apnea review and education materials provided. Migraines are doing much better with the Nortriptyline and we will continue this for her migraines. She was encouraged to monitor BP at home. Contact PCP if readings remain elevated. Healthy lifestyle habits encouraged. She will follow up in 1 year, sooner if needed. She verbalizes understanding and agreement with this plan.    No orders of the defined types were placed in this encounter.    No orders of the defined types were placed in this encounter.     Debbora Presto, FNP-C 03/20/2022, 1:55 PM [facility] [address], [place], [place] [address] [phone]

## 2022-03-20 NOTE — Patient Instructions (Incomplete)
Below is our plan:  We will continue nortriptyline '50mg'$  at bedtime.   Please continue using your CPAP regularly. While your insurance requires that you use CPAP at least 4 hours each night on 70% of the nights, I recommend, that you not skip any nights and use it throughout the night if you can. Getting used to CPAP and staying with the treatment long term does take time and patience and discipline. Untreated obstructive sleep apnea when it is moderate to severe can have an adverse impact on cardiovascular health and raise her risk for heart disease, arrhythmias, hypertension, congestive heart failure, stroke and diabetes. Untreated obstructive sleep apnea causes sleep disruption, nonrestorative sleep, and sleep deprivation. This can have an impact on your day to day functioning and cause daytime sleepiness and impairment of cognitive function, memory loss, mood disturbance, and problems focussing. Using CPAP regularly can improve these symptoms.  Please make sure you are staying well hydrated. I recommend 50-60 ounces daily. Well balanced diet and regular exercise encouraged. Consistent sleep schedule with 6-8 hours recommended.   Please continue follow up with care team as directed.   Follow up with *** in ***  You may receive a survey regarding today's visit. I encourage you to leave honest feed back as I do use this information to improve patient care. Thank you for seeing me today!

## 2022-03-21 ENCOUNTER — Encounter: Payer: Self-pay | Admitting: Family Medicine

## 2022-03-21 ENCOUNTER — Ambulatory Visit: Payer: Medicare Other | Admitting: Family Medicine

## 2022-03-21 VITALS — BP 134/76 | HR 87 | Ht 65.0 in | Wt 228.2 lb

## 2022-03-21 DIAGNOSIS — G43709 Chronic migraine without aura, not intractable, without status migrainosus: Secondary | ICD-10-CM

## 2022-03-21 DIAGNOSIS — G4733 Obstructive sleep apnea (adult) (pediatric): Secondary | ICD-10-CM

## 2022-03-21 DIAGNOSIS — R42 Dizziness and giddiness: Secondary | ICD-10-CM

## 2022-03-21 DIAGNOSIS — R000 Tachycardia, unspecified: Secondary | ICD-10-CM

## 2022-03-21 MED ORDER — NORTRIPTYLINE HCL 25 MG PO CAPS: 50.0000 mg | ORAL_CAPSULE | Freq: Every day | ORAL | 3 refills | Status: DC

## 2022-03-27 ENCOUNTER — Other Ambulatory Visit: Payer: Self-pay | Admitting: Interventional Cardiology

## 2022-03-27 DIAGNOSIS — I10 Essential (primary) hypertension: Secondary | ICD-10-CM

## 2022-03-28 ENCOUNTER — Ambulatory Visit (INDEPENDENT_AMBULATORY_CARE_PROVIDER_SITE_OTHER): Payer: Medicare Other

## 2022-03-28 DIAGNOSIS — J309 Allergic rhinitis, unspecified: Secondary | ICD-10-CM

## 2022-04-01 ENCOUNTER — Encounter: Payer: Self-pay | Admitting: Hematology & Oncology

## 2022-04-02 DIAGNOSIS — G4733 Obstructive sleep apnea (adult) (pediatric): Secondary | ICD-10-CM | POA: Diagnosis not present

## 2022-04-03 ENCOUNTER — Encounter (HOSPITAL_BASED_OUTPATIENT_CLINIC_OR_DEPARTMENT_OTHER): Payer: Self-pay | Admitting: Emergency Medicine

## 2022-04-03 ENCOUNTER — Other Ambulatory Visit: Payer: Self-pay

## 2022-04-03 ENCOUNTER — Emergency Department (HOSPITAL_BASED_OUTPATIENT_CLINIC_OR_DEPARTMENT_OTHER): Payer: Medicare Other

## 2022-04-03 ENCOUNTER — Telehealth: Payer: Self-pay

## 2022-04-03 ENCOUNTER — Emergency Department (HOSPITAL_BASED_OUTPATIENT_CLINIC_OR_DEPARTMENT_OTHER)
Admission: EM | Admit: 2022-04-03 | Discharge: 2022-04-03 | Disposition: A | Discharge status: Home / Self Care | Payer: Medicare Other | Attending: Emergency Medicine | Admitting: Emergency Medicine

## 2022-04-03 DIAGNOSIS — M5431 Sciatica, right side: Secondary | ICD-10-CM | POA: Insufficient documentation

## 2022-04-03 DIAGNOSIS — I10 Essential (primary) hypertension: Secondary | ICD-10-CM | POA: Insufficient documentation

## 2022-04-03 DIAGNOSIS — M25551 Pain in right hip: Secondary | ICD-10-CM | POA: Diagnosis not present

## 2022-04-03 DIAGNOSIS — M5441 Lumbago with sciatica, right side: Secondary | ICD-10-CM | POA: Diagnosis not present

## 2022-04-03 MED ORDER — DEXAMETHASONE SODIUM PHOSPHATE 10 MG/ML IJ SOLN
10.0000 mg | Freq: Once | INTRAMUSCULAR | Status: AC
  Administered 2022-04-03: 10 mg via INTRAMUSCULAR
  Filled 2022-04-03: qty 1

## 2022-04-03 MED ORDER — HYDROCODONE-ACETAMINOPHEN 5-325 MG PO TABS: 1.0000 | ORAL_TABLET | ORAL | 0 refills | Status: DC | PRN

## 2022-04-03 MED ORDER — MELOXICAM 15 MG PO TABS: 15.0000 mg | ORAL_TABLET | Freq: Every day | ORAL | 0 refills | Status: DC

## 2022-04-03 MED ORDER — KETOROLAC TROMETHAMINE 30 MG/ML IJ SOLN
30.0000 mg | Freq: Once | INTRAMUSCULAR | Status: AC
  Administered 2022-04-03: 30 mg via INTRAMUSCULAR
  Filled 2022-04-03: qty 1

## 2022-04-03 MED ORDER — PREDNISONE 50 MG PO TABS: 50.0000 mg | ORAL_TABLET | Freq: Every day | ORAL | 0 refills | Status: DC

## 2022-04-03 NOTE — Telephone Encounter (Signed)
Pt called to schedule appt after triage but triage message was routed to provider. Advised pt I will call her back once I find out whether she should be scheduled with another provider in our office or go to the ED.

## 2022-04-03 NOTE — ED Provider Notes (Signed)
[facility] Provider Note   CSN: [id number] Arrival date & time: 04/03/22  1433     History  Chief Complaint  Patient presents with   Hip Pain    Stephanie Parks is a 77 y.o. female.  Pt is a 77 yo female with pmhx significant for htn, gerd, migraines, hld, anemia, and PE (with Covid; off Eliquis).  Pt has had right hip pain for 2 weeks.  She denies any trauma.  She said her pain goes from her low back to her hip and down her right leg.  She is able to ambulate.  She did drive here.  She made an appt with her orthopedist (Dr. Ninfa Linden), but it is not until Monday and she can't take the pain any more.  She denies any bowel bladder dysfunction.       Home Medications Prior to Admission medications   Medication Sig Start Date End Date Taking? Authorizing Provider  HYDROcodone-acetaminophen (NORCO/VICODIN) 5-325 MG tablet Take 1 tablet by mouth every 4 (four) hours as needed. 04/03/22  Yes Isla Pence, MD  meloxicam (MOBIC) 15 MG tablet Take 1 tablet (15 mg total) by mouth daily. 04/03/22  Yes Isla Pence, MD  predniSONE (DELTASONE) 50 MG tablet Take 1 tablet (50 mg total) by mouth daily with breakfast. 04/03/22  Yes Isla Pence, MD  acetaminophen (TYLENOL) 325 MG tablet Take 650 mg by mouth every 6 (six) hours as needed for moderate pain or headache.    [provider]  Calcium Carb-Cholecalciferol (CALCIUM 600 + D PO) Take 1 tablet by mouth daily.    [provider]  carboxymethylcellulose (REFRESH PLUS) 0.5 % SOLN Place 1 drop into both eyes 3 (three) times daily as needed (dry eyes).    [provider]  ELDERBERRY PO Take 1 capsule by mouth daily.    [provider]  EPINEPHrine (EPIPEN 2-PAK) 0.3 mg/0.3 mL IJ SOAJ injection Inject 0.3 mg into the muscle as needed for anaphylaxis. Per allergen immunotherapy protocol 09/27/21   Copland, Gay Filler, MD  fluticasone (FLONASE) 50 MCG/ACT nasal  spray Place 2 sprays into both nostrils daily as needed for allergies. 04/11/21   Copland, Gay Filler, MD  KLOR-CON M20 20 MEQ tablet TAKE 2 TABLETS BY MOUTH EVERY DAY 01/12/22   Copland, Gay Filler, MD  losartan (COZAAR) 100 MG tablet Take 0.5 tablets (50 mg total) by mouth daily. 03/28/22   Jettie Booze, MD  MAGNESIUM PO Take 1 tablet by mouth daily.    [provider]  Multiple Vitamins-Minerals (ALIVE MULTI-VITAMIN PO) Take 1 tablet by mouth daily.    [provider]  NON FORMULARY Pt uses a cpap nightly    [provider]  nortriptyline (PAMELOR) 25 MG capsule Take 2 capsules (50 mg total) by mouth at bedtime. 03/21/22   Lomax, Amy, NP  omeprazole (PRILOSEC) 40 MG capsule Take 40 mg by mouth daily. 10/05/20   [provider]  polyethylene glycol (MIRALAX) 17 g packet Take 17 g by mouth daily. 02/06/22   Leanord Asal K, DO  rosuvastatin (CRESTOR) 10 MG tablet TAKE 1 TABLET BY MOUTH EVERY DAY 01/12/22   Copland, Gay Filler, MD  senna-docusate (SENOKOT-S) 8.6-50 MG tablet Take 1 tablet by mouth daily. 02/06/22   Kemper Durie, DO  spironolactone (ALDACTONE) 25 MG tablet Take 12.5 mg by mouth daily. Pt takes 1/2 tablet 12.5 mg daily.    [provider]  zinc gluconate 50 MG  tablet Take 50 mg by mouth daily.    [provider]      Allergies    Shellfish allergy, Ace inhibitors, Gabapentin, Pitavastatin, Sulfa antibiotics, and Topiramate    Review of Systems   Review of Systems  Musculoskeletal:  Positive for back pain.       Right hip pain  All other systems reviewed and are negative.   Physical Exam Updated Vital Signs BP (!) 154/65 (BP Location: Left Arm)   Pulse 96   Temp 98.4 F (36.9 C)   Resp 20   Ht 5' 5"$  (1.651 m)   Wt 103.4 kg   SpO2 97%   BMI 37.94 kg/m  Physical Exam Vitals and nursing note reviewed.  Constitutional:      Appearance: Normal appearance.  HENT:     Head: Normocephalic and atraumatic.      Right Ear: External ear normal.     Left Ear: External ear normal.     Nose: Nose normal.     Mouth/Throat:     Mouth: Mucous membranes are moist.     Pharynx: Oropharynx is clear.  Eyes:     Extraocular Movements: Extraocular movements intact.     Conjunctiva/sclera: Conjunctivae normal.     Pupils: Pupils are equal, round, and reactive to light.  Cardiovascular:     Rate and Rhythm: Normal rate and regular rhythm.     Pulses: Normal pulses.     Heart sounds: Normal heart sounds.  Pulmonary:     Effort: Pulmonary effort is normal.     Breath sounds: Normal breath sounds.  Abdominal:     General: Abdomen is flat. Bowel sounds are normal.     Palpations: Abdomen is soft.  Musculoskeletal:        General: Normal range of motion.     Cervical back: Normal range of motion and neck supple.  Skin:    General: Skin is warm.     Capillary Refill: Capillary refill takes less than 2 seconds.  Neurological:     General: No focal deficit present.     Mental Status: She is alert and oriented to person, place, and time.  Psychiatric:        Mood and Affect: Mood normal.        Behavior: Behavior normal.     ED Results / Procedures / Treatments   Labs (all labs ordered are listed, but only abnormal results are displayed) Labs Reviewed - No data to display  EKG None  Radiology DG HIP UNILAT WITH PELVIS 2-3 VIEWS RIGHT  Result Date: 04/03/2022 CLINICAL DATA:  Severe right hip pain EXAM: DG HIP (WITH OR WITHOUT PELVIS) 2-3V RIGHT COMPARISON:  CT abdomen and pelvis 02/06/2022 FINDINGS: There are mild degenerative changes of the right hip with osteophyte formation, unchanged from prior. There is no acute fracture or dislocation. Left hip arthroplasty appears in anatomic alignment without evidence for hardware loosening. There are mild degenerative changes of the sacroiliac joints. Soft tissues are within normal limits. IMPRESSION: 1. No acute fracture or dislocation. 2. Mild  degenerative changes of the right hip. Electronically Signed   By: Ronney Asters M.D.   On: 04/03/2022 15:13    Procedures Procedures    Medications Ordered in ED Medications  dexamethasone (DECADRON) injection 10 mg (10 mg Intramuscular Given 04/03/22 1700)  ketorolac (TORADOL) 30 MG/ML injection 30 mg (30 mg Intramuscular Given 04/03/22 1659)    ED Course/ Medical Decision Making/ A&P  Medical Decision Making Risk Prescription drug management.   This patient presents to the ED for concern of right hip pain, this involves an extensive number of treatment options, and is a complaint that carries with it a high risk of complications and morbidity.  The differential diagnosis includes hip fx, sciatica, arthritis   Co morbidities that complicate the patient evaluation  htn, gerd, migraines, hld, anemia, and PE (with Covid; off Eliquis)   Additional history obtained:  Additional history obtained from epic chart review  Imaging Studies ordered:  I ordered imaging studies including r hip  I independently visualized and interpreted imaging which showed  IMPRESSION:  1. No acute fracture or dislocation.  2. Mild degenerative changes of the right hip.   I agree with the radiologist interpretation   Cardiac Monitoring:  The patient was maintained on a cardiac monitor.  I personally viewed and interpreted the cardiac monitored which showed an underlying rhythm of: nsr   Medicines ordered and prescription drug management:  I ordered medication including toradol and decadron  for pain  Reevaluation of the patient after these medicines showed that the patient improved I have reviewed the patients home medicines and have made adjustments as needed  Problem List / ED Course:  Sciatica:  sx c/w sciatica.  She has no sx of cord compression or cauda equina syn.   Reevaluation:  After the interventions noted above, I reevaluated the patient and found  that they have :improved   Social Determinants of Health:  Lives at home   Dispostion:  After consideration of the diagnostic results and the patients response to treatment, I feel that the patent would benefit from discharge with outpatient f/u.          Final Clinical Impression(s) / ED Diagnoses Final diagnoses:  Sciatica of right side    Rx / DC Orders ED Discharge Orders          Ordered    HYDROcodone-acetaminophen (NORCO/VICODIN) 5-325 MG tablet  Every 4 hours PRN        04/03/22 1730    predniSONE (DELTASONE) 50 MG tablet  Daily with breakfast        04/03/22 1730    meloxicam (MOBIC) 15 MG tablet  Daily        04/03/22 1730              Isla Pence, MD 04/03/22 1732

## 2022-04-03 NOTE — Telephone Encounter (Signed)
Initial Comment Caller states she is having severe right pain in her abdomen and going down her leg. Its effecting her walking and she is also having trouble sleeping because of the pain. Its been going on for about 3 weeks now. Translation No Nurse Assessment Nurse: [place], RN, Estill Bamberg Date/Time (Eastern Time): 04/03/2022 12:53:23 PM Confirm and document reason for call. If symptomatic, describe symptoms. ---caller states she has pain in the right hip/groin down to the foot. has appointment for her hip monday. Does the patient have any new or worsening symptoms? ---Yes Will a triage be completed? ---Yes Related visit to physician within the last 2 weeks? ---No Does the PT have any chronic conditions? (i.e. diabetes, asthma, this includes High risk factors for pregnancy, etc.) ---Yes List chronic conditions. ---chronic back Is this a behavioral health or substance abuse call? ---No Guidelines Guideline Title Affirmed Question Affirmed Notes Nurse Date/Time (Eastern Time) Hip Pain [1] SEVERE pain (e.g., excruciating, unable to do any normal activities) AND [2] not improved after 2 hours of pain medicine Humfleet, RN, Estill Bamberg 04/03/2022 12:55:01 PM PLEASE NOTE: All timestamps contained within this report are represented as [place] Standard Time. CONFIDENTIALTY NOTICE: This fax transmission is intended only for the addressee. It contains information that is legally privileged, confidential or otherwise protected from use or disclosure. If you are not the intended recipient, you are strictly prohibited from reviewing, disclosing, copying using or disseminating any of this information or taking any action in reliance on or regarding this information. If you have received this fax in error, please notify us immediately by telephone so that we can arrange for its return to [place]. Phone: [phone], Toll-Free: [phone], Fax: [phone] Page: 2 of 2 Call Id: [id number] [place]. Time  Eilene Ghazi Time) Disposition Final User 04/03/2022 12:52:43 PM Send to Urgent Queue Jaynie Crumble 04/03/2022 12:59:22 PM See HCP within 4 Hours (or PCP triage) Yes Humfleet, RN, Estill Bamberg Final Disposition 04/03/2022 12:59:22 PM See HCP within 4 Hours (or PCP triage) Yes Humfleet, RN, Shelly Coss Disagree/Comply Comply Caller Understands Yes PreDisposition Did not know what to do Care Advice Given Per Guideline SEE HCP (OR PCP TRIAGE) WITHIN 4 HOURS: * IF OFFICE WILL BE OPEN: You need to be seen within the next 3 or 4 hours. Call your doctor (or NP/PA) now or as soon as the office opens. CARE ADVICE given per Hip Pain (Adult) guideline. * You become worse CALL BACK IF: Comments User: Rozelle Logan, RN Date/Time Eilene Ghazi Time): 04/03/2022 12:59:21 PM called back line 3x - does not connect. called main office line - reroutes to [employer]. Referrals REFERRED TO PCP OFFICE

## 2022-04-03 NOTE — Telephone Encounter (Signed)
Pt called back to advise she needs to go to ED if she is in distress. Pt said she would just to to ED.

## 2022-04-03 NOTE — ED Notes (Signed)
Discharge instructions reviewed with patient. Patient verbalizes understanding, no further questions at this time. Medications/prescriptions and follow up information provided. No acute distress noted at time of departure.  

## 2022-04-03 NOTE — ED Triage Notes (Signed)
Pt c/o RT hip pain x 2 wks; no injury;  has appt w/ ortho on Mon, but can't wait d/t pain

## 2022-04-08 ENCOUNTER — Ambulatory Visit (INDEPENDENT_AMBULATORY_CARE_PROVIDER_SITE_OTHER): Payer: Medicare Other

## 2022-04-08 ENCOUNTER — Encounter: Payer: Self-pay | Admitting: Physician Assistant

## 2022-04-08 ENCOUNTER — Ambulatory Visit: Payer: Medicare Other | Admitting: Physician Assistant

## 2022-04-08 DIAGNOSIS — M79604 Pain in right leg: Secondary | ICD-10-CM

## 2022-04-08 DIAGNOSIS — M5416 Radiculopathy, lumbar region: Secondary | ICD-10-CM | POA: Diagnosis not present

## 2022-04-08 MED ORDER — HYDROCODONE-ACETAMINOPHEN 5-325 MG PO TABS: 1.0000 | ORAL_TABLET | ORAL | 0 refills | Status: DC | PRN

## 2022-04-08 MED ORDER — CYCLOBENZAPRINE HCL 10 MG PO TABS: 10.0000 mg | ORAL_TABLET | Freq: Three times a day (TID) | ORAL | 0 refills | Status: DC | PRN

## 2022-04-08 NOTE — Progress Notes (Signed)
Office Visit Note   Patient: Stephanie Parks           Date of Birth: [birth year]           MRN: [id number] Visit Date: 04/08/2022              Requested by: [name], MD [address] [place],  [place] [address] PCP: [name], MD   Assessment & Plan: Visit Diagnoses:  1. Pain in right leg   2. Radiculopathy, lumbar region     Plan:  Given patient's radicular symptoms that is failed conservative treatment which is consisted of medications.  And the fact that she has had progressive degenerative changes in the span of less than 3 years recommend MRI to evaluate lumbar spine.  MRIs to rule out HNP as a source of her right radicular pain.  She would like to follow-up with Dr. Christella Noa after the MRI to go over results and hide hopefully undergo epidural steroid injections as she has undergone in the past with him.  Follow-Up Instructions: Return if symptoms worsen or fail to improve.   Orders:  Orders Placed This Encounter  Procedures   XR Lumbar Spine 2-3 Views   Meds ordered this encounter  Medications   cyclobenzaprine (FLEXERIL) 10 MG tablet    Sig: Take 1 tablet (10 mg total) by mouth 3 (three) times daily as needed for muscle spasms.    Dispense:  30 tablet    Refill:  0   HYDROcodone-acetaminophen (NORCO/VICODIN) 5-325 MG tablet    Sig: Take 1 tablet by mouth every 4 (four) hours as needed.    Dispense:  10 tablet    Refill:  0      Procedures: No procedures performed   Clinical Data: No additional findings.   Subjective: Chief Complaint  Patient presents with   Lower Back - Pain   Right Leg - Pain    HPI Stephanie Parks comes in today with right hip and back pain.  She states she is having pain that radiates from the back down the anterior aspect of her thigh all the way to the anterior ankle does not go into the foot.  She describes it as sharp pain.  She has had no injury.  Began with a pack pain about 3 weeks ago and was  progressed.  She was seen in the ER where she was given prednisone hydrocodone and meloxicam.  States that these not helped a whole lot.  She states she can hardly walk.  Denies any weight loss bowel or bladder dysfunction saddle anesthesia symptoms.  She is having no symptoms on the left.  History of left total hip arthroplasty by Dr. Delilah Shan is doing well.  Prior back pain years ago that was similar and underwent epidural steroid injections by Dr. Christella Noa. She notes that her pain is worse with standing for prolonged period of time and better when sitting.  Review of Systems  Constitutional:  Negative for chills and fever.  Musculoskeletal:  Positive for back pain.  See HPI   Objective: Vital Signs: There were no vitals taken for this visit.  Physical Exam Constitutional:      Appearance: She is not ill-appearing or diaphoretic.  Pulmonary:     Effort: Pulmonary effort is normal.  Neurological:     Mental Status: She is alert and oriented to person, place, and time.     Ortho Exam Lumbar spine she has limited extension lumbar  spine.  Tenderness lower lumbar right paraspinous region.  Full flexion able to touch her toes.  Positive straight leg raise on the right negative on the left.  She has weakness of the right hip flexors 4 out of 5.  Otherwise strength throughout lower extremities 5 out of 5 against resistance.  Dorsal pedal pulses are 2+ bilaterally.  Sensation grossly intact bilateral feet to light touch.  Full range of motion bilateral hips without pain. Specialty Comments:  No specialty comments available.  Imaging: XR Lumbar Spine 2-3 Views  Result Date: 04/08/2022 Lumbar spine 2 views: No acute fractures.  No spinal listhesis.  Normal lordotic curvature.  Slight scoliosis.  Progressive diffuse degenerative changes throughout lumbar spine most notably with generative disc disease at L4 4 5 which is moderate and complete loss of the L5-S1 disc space.  Films are compared to  radiographs obtained 2 years 9 months ago and lumbar spine.    PMFS History: Patient Active Problem List   Diagnosis Date Noted   Status post total replacement of left hip 09/04/2021   DJD (degenerative joint disease) 09/04/2021   Iron deficiency anemia 03/13/2021   Erythropoietin deficiency anemia 03/13/2021   Unilateral primary osteoarthritis, left hip 10/18/2020   Greater trochanteric bursitis of left hip 10/18/2020   Headache 06/07/2020   Acute respiratory failure with hypoxia ([place]) 05/02/2020   Post-COVID chronic dyspnea 05/02/2020   Pulsatile neck mass 04/26/2020   Pneumonia due to COVID-19 virus 03/12/2020   Headache disorder 01/04/2020   Seasonal allergic conjunctivitis 10/04/2019   Dysfunction of right eustachian tube 10/04/2019   Pre-diabetes 06/02/2019   Seasonal and perennial allergic rhinitis 12/22/2018   Heart palpitations 12/22/2018   History of chest pain 07/20/2018   Brain aneurysm 07/20/2018   Dyslipidemia 05/04/2018   Tendonitis, Achilles, right 12/08/2017   Unilateral primary osteoarthritis, left knee 10/07/2017   Ingrown toenail 08/10/2017   Coughing 04/24/2017   CAD (coronary artery disease) 03/01/2015   Aneurysm ([place]) 03/01/2015   Dizziness 03/01/2015   Paresthesia of arm 03/01/2015   Achalasia 09/28/2012   CN (constipation) 09/28/2012   Cephalalgia 08/24/2012   Essential hypertension 04/19/2011   Hyperlipidemia 04/19/2011   GERD (gastroesophageal reflux disease) 04/19/2011   Chest pain 04/04/2011   Past Medical History:  Diagnosis Date   Achalasia 09/28/2012   Allergic rhinitis 03/01/2015   Anemia    Aneurysm ([place]) 03/01/2015   stable 5 mm periopthalmic right ICA aneurysm 03/25/21 CTA   BP (high blood pressure) 03/01/2015   Bronchitis 04/23/2018   Cephalalgia 08/24/2012   Overview:  ICD-10 cut over     Chest pain 04/04/2011   Coronary Ca Score 0, no significant CAD 08/20/18; CP with troponin 4>32 s/p LHC showing normal coronaries 12/29/18    CN (constipation) 09/28/2012   Coughing 04/24/2017   Decreased potassium in the blood 03/01/2015   Diverticulosis    Dizziness 03/01/2015   Erythropoietin deficiency anemia 03/13/2021   GERD (gastroesophageal reflux disease)    Hiatal hernia    Hypercholesterolemia 03/01/2015   Hyperlipidemia    Hypertension    Ingrown toenail 08/10/2017   Inguinal hernia    Iron deficiency anemia 03/13/2021   Migraine headache    Onychomycosis 12/08/2017   Paresthesia of arm 03/01/2015   PE (pulmonary thromboembolism) ([place]) 03/13/2020   in setting of + COVID-19, s/p Eliquis x 3 month   Schatzki's ring    Tendonitis, Achilles, right 12/08/2017   Unilateral primary osteoarthritis, left knee 10/07/2017    Family History  Problem Relation Age of Onset   Allergic rhinitis Sister    Diabetes Sister    Hypertension Sister    Heart attack Father 87   Heart disease Father    Stomach cancer Maternal Grandmother    Heart disease Mother    Colon cancer Neg Hx    Angioedema Neg Hx    Asthma Neg Hx    Eczema Neg Hx    Urticaria Neg Hx    Immunodeficiency Neg Hx    Breast cancer Neg Hx    Migraines Neg Hx    Headache Neg Hx     Past Surgical History:  Procedure Laterality Date   ABDOMINAL HYSTERECTOMY     BACK SURGERY     x 3   CARDIAC CATHETERIZATION  12/29/2018   angiographically normal coronary arteries 12/29/18 ([place])   CARPAL TUNNEL RELEASE     left wrist   CERVICAL SPINE SURGERY     HEMORRHOID SURGERY     TOTAL HIP ARTHROPLASTY Left 09/04/2021   Procedure: LEFT TOTAL HIP ARTHROPLASTY ANTERIOR APPROACH;  Surgeon: Mcarthur Rossetti, MD;  Location: [facility];  Service: Orthopedics;  Laterality: Left;   Social History   Occupational History   Occupation: Surveyor, 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

## 2022-04-09 ENCOUNTER — Ambulatory Visit (HOSPITAL_BASED_OUTPATIENT_CLINIC_OR_DEPARTMENT_OTHER)
Admission: RE | Admit: 2022-04-09 | Discharge: 2022-04-09 | Disposition: A | Discharge status: Home / Self Care | Payer: Medicare Other | Source: Ambulatory Visit | Attending: Family Medicine | Admitting: Family Medicine

## 2022-04-09 ENCOUNTER — Encounter (HOSPITAL_BASED_OUTPATIENT_CLINIC_OR_DEPARTMENT_OTHER): Payer: Self-pay

## 2022-04-09 DIAGNOSIS — M85851 Other specified disorders of bone density and structure, right thigh: Secondary | ICD-10-CM | POA: Diagnosis not present

## 2022-04-09 DIAGNOSIS — Z1231 Encounter for screening mammogram for malignant neoplasm of breast: Secondary | ICD-10-CM | POA: Insufficient documentation

## 2022-04-09 DIAGNOSIS — Z780 Asymptomatic menopausal state: Secondary | ICD-10-CM | POA: Diagnosis not present

## 2022-04-09 DIAGNOSIS — G4733 Obstructive sleep apnea (adult) (pediatric): Secondary | ICD-10-CM | POA: Diagnosis not present

## 2022-04-09 NOTE — Addendum Note (Signed)
Addended by: Robyne Peers on: 04/09/2022 09:15 AM   Modules accepted: Orders

## 2022-04-10 ENCOUNTER — Encounter: Payer: Self-pay | Admitting: Family Medicine

## 2022-04-10 DIAGNOSIS — E876 Hypokalemia: Secondary | ICD-10-CM

## 2022-04-11 ENCOUNTER — Telehealth: Payer: Self-pay | Admitting: Physician Assistant

## 2022-04-11 ENCOUNTER — Other Ambulatory Visit: Payer: Self-pay | Admitting: Physician Assistant

## 2022-04-11 MED ORDER — METHOCARBAMOL 500 MG PO TABS: 500.0000 mg | ORAL_TABLET | Freq: Three times a day (TID) | ORAL | 0 refills | Status: DC

## 2022-04-11 NOTE — Telephone Encounter (Signed)
Patient states her muscle relaxer is causing dizziness. She is wanting some Prednisone states she is in a lot of pain.

## 2022-04-16 ENCOUNTER — Ambulatory Visit
Admission: RE | Admit: 2022-04-16 | Discharge: 2022-04-16 | Disposition: A | Discharge status: Home / Self Care | Payer: Medicare Other | Source: Ambulatory Visit | Attending: Physician Assistant | Admitting: Physician Assistant

## 2022-04-16 ENCOUNTER — Encounter: Payer: Self-pay | Admitting: Hematology & Oncology

## 2022-04-16 DIAGNOSIS — M5416 Radiculopathy, lumbar region: Secondary | ICD-10-CM

## 2022-04-16 DIAGNOSIS — M48061 Spinal stenosis, lumbar region without neurogenic claudication: Secondary | ICD-10-CM | POA: Diagnosis not present

## 2022-04-16 DIAGNOSIS — M79604 Pain in right leg: Secondary | ICD-10-CM

## 2022-04-16 DIAGNOSIS — M545 Low back pain, unspecified: Secondary | ICD-10-CM | POA: Diagnosis not present

## 2022-04-16 MED ORDER — GADOPICLENOL 0.5 MMOL/ML IV SOLN: 7.5000 mL | Freq: Once | INTRAVENOUS | Status: DC | PRN

## 2022-04-22 ENCOUNTER — Other Ambulatory Visit: Payer: Medicare Other

## 2022-04-23 DIAGNOSIS — M25551 Pain in right hip: Secondary | ICD-10-CM | POA: Diagnosis not present

## 2022-04-24 ENCOUNTER — Ambulatory Visit (INDEPENDENT_AMBULATORY_CARE_PROVIDER_SITE_OTHER): Payer: Medicare Other

## 2022-04-24 ENCOUNTER — Other Ambulatory Visit: Payer: Self-pay | Admitting: Neurosurgery

## 2022-04-24 ENCOUNTER — Encounter: Payer: Self-pay | Admitting: Hematology & Oncology

## 2022-04-24 DIAGNOSIS — J309 Allergic rhinitis, unspecified: Secondary | ICD-10-CM

## 2022-04-24 DIAGNOSIS — M25551 Pain in right hip: Secondary | ICD-10-CM

## 2022-04-24 NOTE — Addendum Note (Signed)
Addended by: Lamar Blinks C on: 04/24/2022 04:28 PM   Modules accepted: Orders

## 2022-04-25 ENCOUNTER — Encounter: Payer: Self-pay | Admitting: Radiology

## 2022-04-25 NOTE — Telephone Encounter (Signed)
Pt has been scheduled.  °

## 2022-04-29 ENCOUNTER — Telehealth: Payer: Self-pay | Admitting: Orthopaedic Surgery

## 2022-04-29 NOTE — Telephone Encounter (Signed)
Called. Per patient she stated Dr. Ninfa Linden recommended she see a shoulder specialist in our office. I advised this was Dr. Marlou Sa. Since Dr. Marlou Sa is booked out I scheduled pt to see luke.

## 2022-04-29 NOTE — Telephone Encounter (Signed)
Pt called requesting a call or recommend another Dr in our office for right shoulder. Please call pt about this matter at [phone].

## 2022-05-02 ENCOUNTER — Other Ambulatory Visit (INDEPENDENT_AMBULATORY_CARE_PROVIDER_SITE_OTHER): Payer: Medicare Other

## 2022-05-02 ENCOUNTER — Encounter: Payer: Self-pay | Admitting: Family Medicine

## 2022-05-02 DIAGNOSIS — E876 Hypokalemia: Secondary | ICD-10-CM | POA: Diagnosis not present

## 2022-05-02 LAB — BASIC METABOLIC PANEL
BUN: 16 mg/dL (ref 6–23)
CO2: 32 mEq/L (ref 19–32)
Calcium: 9.6 mg/dL (ref 8.4–10.5)
Chloride: 102 mEq/L (ref 96–112)
Creatinine, Ser: 1.08 mg/dL (ref 0.40–1.20)
GFR: 49.88 mL/min — ABNORMAL LOW (ref >60.00)
Glucose, Bld: 80 mg/dL (ref 70–99)
Potassium: 4.3 mEq/L (ref 3.5–5.1)
Sodium: 141 mEq/L (ref 135–145)

## 2022-05-05 ENCOUNTER — Ambulatory Visit
Admission: RE | Admit: 2022-05-05 | Discharge: 2022-05-05 | Disposition: A | Discharge status: Home / Self Care | Payer: Medicare Other | Source: Ambulatory Visit | Attending: Neurosurgery | Admitting: Neurosurgery

## 2022-05-05 DIAGNOSIS — M25551 Pain in right hip: Secondary | ICD-10-CM

## 2022-05-06 ENCOUNTER — Ambulatory Visit: Payer: Medicare Other | Admitting: Surgical

## 2022-05-06 ENCOUNTER — Encounter: Payer: Self-pay | Admitting: Surgical

## 2022-05-06 DIAGNOSIS — M7541 Impingement syndrome of right shoulder: Secondary | ICD-10-CM

## 2022-05-06 MED ORDER — METHYLPREDNISOLONE ACETATE 40 MG/ML IJ SUSP
40.0000 mg | INTRAMUSCULAR | Status: AC | PRN
  Administered 2022-05-06: 40 mg via INTRA_ARTICULAR

## 2022-05-06 MED ORDER — BUPIVACAINE HCL 0.5 % IJ SOLN
9.0000 mL | INTRAMUSCULAR | Status: AC | PRN
  Administered 2022-05-06: 9 mL via INTRA_ARTICULAR

## 2022-05-06 MED ORDER — LIDOCAINE HCL 1 % IJ SOLN
5.0000 mL | INTRAMUSCULAR | Status: AC | PRN
  Administered 2022-05-06: 5 mL

## 2022-05-06 NOTE — Progress Notes (Signed)
Office Visit Note   Patient: Stephanie Parks           Date of Birth: 1945-08-09           MRN: [id number] Visit Date: 05/06/2022 Requested by: [name], MD [address],  [place] [address] PCP: [name], MD  Subjective: Chief Complaint  Patient presents with   Right Shoulder - Pain    HPI: Stephanie Parks is a 77 y.o. female who presents to the office reporting right shoulder pain.  Patient states that she has primarily superior lateral shoulder pain that has been worsening.  She was recently seen by Dr. Ninfa Linden for this several months ago who gave her injection that provided about 2 months of relief.  She states that she has a history of prior rotator cuff tear of the that was diagnosed by another provider.  She denies any radicular pain, numbness/tingling, neck pain.  She occasionally will have some scapular pain but this is inconsistent.  She has difficulty sleeping on her right shoulder.  She states that pain is worse primarily with forward flexion.  She denies any popping or mechanical symptoms.  No prior surgery to the shoulder.  Does have history of prior ACDF by Dr. Christella Noa was done years ago and she has done well from this..                ROS: All systems reviewed are negative as they relate to the chief complaint within the history of present illness.  Patient denies fevers or chills.  Assessment & Plan: Visit Diagnoses: No diagnosis found.  Plan: Patient is a 77 year old female who presents for evaluation of right shoulder pain.  She has history of right shoulder pain has been ongoing for several months and she has seen Dr. Ninfa Linden with good relief for 2 months from prior injection that he did.  She would like to repeat this injection today.  She does have radiographs from late December demonstrating no significant degenerative changes or acute osseous abnormality.  She has some rotator cuff weakness on exam regarding infraspinatus  but she still has functional range of motion and she does not have lag sign or Hornblower sign.  Also has impingement signs that are present on exam.  After discussion of options, she does not want to proceed with any sort of surgical intervention and she would like to repeat injection.  Subacromial injection administered and patient tolerated procedure well.  Follow-up with the office as needed.  She does have prior MRI of the right shoulder from August 2022 demonstrating severe tendinosis of the supraspinatus tendon with full-thickness tear with 9 mm of retraction.  She also has tendinosis of the infraspinatus tendon as well as moderate tendinosis of the long head of the bicep tendon and mild to moderate glenohumeral arthritis.  Follow-Up Instructions: No follow-ups on file.   Orders:  No orders of the defined types were placed in this encounter.  No orders of the defined types were placed in this encounter.     Procedures: Large Joint Inj: R subacromial bursa on 05/06/2022 5:55 PM Indications: diagnostic evaluation and pain Details: 18 G 1.5 in needle, posterior approach  Arthrogram: No  Medications: 9 mL bupivacaine 0.5 %; 40 mg methylPREDNISolone acetate 40 MG/ML; 5 mL lidocaine 1 % Outcome: tolerated well, no immediate complications Procedure, treatment alternatives, risks and benefits explained, specific risks discussed. Consent was given by the patient. Immediately prior to procedure  a time out was called to verify the correct patient, procedure, equipment, support staff and site/side marked as required. Patient was prepped and draped in the usual sterile fashion.       Clinical Data: No additional findings.  Objective: Vital Signs: There were no vitals taken for this visit.  Physical Exam:  Constitutional: Patient appears well-developed HEENT:  Head: Normocephalic Eyes:EOM are normal Neck: Normal range of motion Cardiovascular: Normal rate Pulmonary/chest: Effort  normal Neurologic: Patient is alert Skin: Skin is warm Psychiatric: Patient has normal mood and affect  Ortho Exam: Ortho exam demonstrates right shoulder with 45 degrees X rotation, 100 degrees abduction, 150 degrees forward elevation passively.  This compared with the left shoulder with 40 degrees X rotation, 110 degrees abduction, 160 degrees forward elevation passively.  External rotation strength of the right shoulder rated 4/5.  She has 5/5 supra and subscapularis strength.  She has negative external rotation lag sign.  Negative Hornblower sign.  She is able to achieve active range of motion equivalent to passive range of motion.  Mild tenderness over the bicipital groove.  No tenderness over the [facility] joint.  No pain with crossarm adduction.  She has no tenderness throughout the axial cervical spine.  Negative Spurling sign.  Negative Lhermitte sign.  Positive Hawkins sign.  Positive Neer spine.  Specialty Comments:  No specialty comments available.  Imaging: No results found.   PMFS History: Patient Active Problem List   Diagnosis Date Noted   Status post total replacement of left hip 09/04/2021   DJD (degenerative joint disease) 09/04/2021   Iron deficiency anemia 03/13/2021   Erythropoietin deficiency anemia 03/13/2021   Unilateral primary osteoarthritis, left hip 10/18/2020   Greater trochanteric bursitis of left hip 10/18/2020   Headache 06/07/2020   Acute respiratory failure with hypoxia ([place]) 05/02/2020   Post-COVID chronic dyspnea 05/02/2020   Pulsatile neck mass 04/26/2020   Pneumonia due to COVID-19 virus 03/12/2020   Headache disorder 01/04/2020   Seasonal allergic conjunctivitis 10/04/2019   Dysfunction of right eustachian tube 10/04/2019   Pre-diabetes 06/02/2019   Seasonal and perennial allergic rhinitis 12/22/2018   Heart palpitations 12/22/2018   History of chest pain 07/20/2018   Brain aneurysm 07/20/2018   Dyslipidemia 05/04/2018   Tendonitis, Achilles,  right 12/08/2017   Unilateral primary osteoarthritis, left knee 10/07/2017   Ingrown toenail 08/10/2017   Coughing 04/24/2017   CAD (coronary artery disease) 03/01/2015   Aneurysm ([place]) 03/01/2015   Dizziness 03/01/2015   Paresthesia of arm 03/01/2015   Achalasia 09/28/2012   CN (constipation) 09/28/2012   Cephalalgia 08/24/2012   Essential hypertension 04/19/2011   Hyperlipidemia 04/19/2011   GERD (gastroesophageal reflux disease) 04/19/2011   Chest pain 04/04/2011   Past Medical History:  Diagnosis Date   Achalasia 09/28/2012   Allergic rhinitis 03/01/2015   Anemia    Aneurysm ([place]) 03/01/2015   stable 5 mm periopthalmic right ICA aneurysm 03/25/21 CTA   BP (high blood pressure) 03/01/2015   Bronchitis 04/23/2018   Cephalalgia 08/24/2012   Overview:  ICD-10 cut over     Chest pain 04/04/2011   Coronary Ca Score 0, no significant CAD 08/20/18; CP with troponin 4>32 s/p LHC showing normal coronaries 12/29/18   CN (constipation) 09/28/2012   Coughing 04/24/2017   Decreased potassium in the blood 03/01/2015   Diverticulosis    Dizziness 03/01/2015   Erythropoietin deficiency anemia 03/13/2021   GERD (gastroesophageal reflux disease)    Hiatal hernia    Hypercholesterolemia 03/01/2015   Hyperlipidemia  Hypertension    Ingrown toenail 08/10/2017   Inguinal hernia    Iron deficiency anemia 03/13/2021   Migraine headache    Onychomycosis 12/08/2017   Paresthesia of arm 03/01/2015   PE (pulmonary thromboembolism) ([place]) 03/13/2020   in setting of + COVID-19, s/p Eliquis x 3 month   Schatzki's ring    Tendonitis, Achilles, right 12/08/2017   Unilateral primary osteoarthritis, left knee 10/07/2017    Family History  Problem Relation Age of Onset   Allergic rhinitis Sister    Diabetes Sister    Hypertension Sister    Heart attack Father 67   Heart disease Father    Stomach cancer Maternal Grandmother    Heart disease Mother    Colon cancer Neg Hx    Angioedema Neg Hx     Asthma Neg Hx    Eczema Neg Hx    Urticaria Neg Hx    Immunodeficiency Neg Hx    Breast cancer Neg Hx    Migraines Neg Hx    Headache Neg Hx     Past Surgical History:  Procedure Laterality Date   ABDOMINAL HYSTERECTOMY     BACK SURGERY     x 3   CARDIAC CATHETERIZATION  12/29/2018   angiographically normal coronary arteries 12/29/18 ([place])   CARPAL TUNNEL RELEASE     left wrist   CERVICAL SPINE SURGERY     HEMORRHOID SURGERY     TOTAL HIP ARTHROPLASTY Left 09/04/2021   Procedure: LEFT TOTAL HIP ARTHROPLASTY ANTERIOR APPROACH;  Surgeon: Mcarthur Rossetti, MD;  Location: [facility];  Service: Orthopedics;  Laterality: Left;   Social History   Occupational History   Occupation: Surveyor, 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

## 2022-05-07 ENCOUNTER — Telehealth: Payer: Self-pay | Admitting: Orthopaedic Surgery

## 2022-05-07 DIAGNOSIS — M25551 Pain in right hip: Secondary | ICD-10-CM

## 2022-05-07 NOTE — Telephone Encounter (Signed)
Patient states she got her Mri results and Dr. Christella Noa, advised her that her hip needs injections

## 2022-05-08 DIAGNOSIS — G4733 Obstructive sleep apnea (adult) (pediatric): Secondary | ICD-10-CM | POA: Diagnosis not present

## 2022-05-08 NOTE — Telephone Encounter (Signed)
Called and advised. Pt wants to try this. Referral placed in chart

## 2022-05-08 NOTE — Addendum Note (Signed)
Addended by: Robyne Peers on: 05/08/2022 09:03 AM   Modules accepted: Orders

## 2022-05-10 ENCOUNTER — Telehealth: Payer: Self-pay | Admitting: Orthopaedic Surgery

## 2022-05-10 NOTE — Telephone Encounter (Signed)
Patient want some paperwork for her to be out of work for her job. Please refer to past messages she want to speak to Autumn Hornaday.Marland Kitchen

## 2022-05-10 NOTE — Telephone Encounter (Signed)
I called and talked to the pt. She will drop off the paperwork on Monday when she comes in for her injection and I put a work note in her chart.

## 2022-05-10 NOTE — Telephone Encounter (Signed)
[place] for this note? If so for how long?

## 2022-05-12 ENCOUNTER — Other Ambulatory Visit: Payer: Medicare Other

## 2022-05-13 ENCOUNTER — Ambulatory Visit (INDEPENDENT_AMBULATORY_CARE_PROVIDER_SITE_OTHER): Payer: Medicare Other | Admitting: Sports Medicine

## 2022-05-13 ENCOUNTER — Encounter: Payer: Self-pay | Admitting: Sports Medicine

## 2022-05-13 ENCOUNTER — Other Ambulatory Visit: Payer: Self-pay

## 2022-05-13 DIAGNOSIS — M1611 Unilateral primary osteoarthritis, right hip: Secondary | ICD-10-CM

## 2022-05-13 DIAGNOSIS — M25551 Pain in right hip: Secondary | ICD-10-CM | POA: Diagnosis not present

## 2022-05-13 MED ORDER — METHYLPREDNISOLONE ACETATE 40 MG/ML IJ SUSP
80.0000 mg | INTRAMUSCULAR | Status: AC | PRN
  Administered 2022-05-13: 80 mg via INTRA_ARTICULAR

## 2022-05-13 MED ORDER — LIDOCAINE HCL 1 % IJ SOLN
4.0000 mL | INTRAMUSCULAR | Status: AC | PRN
  Administered 2022-05-13: 4 mL

## 2022-05-13 NOTE — Progress Notes (Signed)
Office & Procedure Note  Patient: Stephanie Parks             Date of Birth: 12-Mar-1945           MRN: [id number]             Visit Date: 05/13/2022  HPI: Stephanie Parks is a pleasant 77 year-old female who presents for acute on chronic right hip pain.  She has been seing Dr. Christella Noa for back pain and was found to tease out where her pain is coming from.  We did order an MRI on 05/05/2022 which showed rather severe femoral acetabular osteoarthritic change.  Sent here for possible injection and further evaluation.  PE: - Right hip: No overlying skin changes, no redness or swelling.  There is pain with both internal and external rotation with mechanical block of about 10 degrees loss of ER and IR compared to the contralateral hip.  Positive FADIR.  Visit Diagnoses:  1. Unilateral primary osteoarthritis, right hip   2. Pain in right hip    Imaging:  MR HIP RIGHT WO CONTRAST CLINICAL DATA:  Right hip pain.  EXAM: MR OF THE RIGHT HIP WITHOUT CONTRAST  TECHNIQUE: Multiplanar, multisequence MR imaging was performed. No intravenous contrast was administered.  COMPARISON:  Pelvis and right hip radiographs 04/03/2022; MRI left hip 10/31/2020  FINDINGS: Bones: There is metallic susceptibility artifact from total left hip arthroplasty that is obscures the majority of the visualized proximal femur and portions of the left hemipelvis. There is again a mild to moderate pubic symphysis joint effusion with mild joint space narrowing and mild-to-moderate peripheral degenerative osteophytosis. Within the limitations of left-sided metallic artifact and inhomogeneous fat saturation, no acute fracture is seen.  Mild-to-moderate bilateral inferior sacroiliac joint space narrowing and subchondral marrow edema.  Articular cartilage and labrum  Right Hip:  Articular cartilage: There is worsened right femoroacetabular osteoarthritis, now with full-thickness cartilage loss throughout the majority  of the anterior superior quadrant of the femoral head. At the minimally anterior aspect of the superior femoral head there is a region measuring up to 10 mm in AP dimension of 1 mm subcortical depression with high-grade underlying marrow edema within this region extending into the anterior superior femoral head quadrant (sagittal series 7, image 12 and coronal series 8 images tendon 11). Findings suggest a focal subchondral right femoral head fracture.  Labrum: There is moderate attenuation and peripheral Marcie Bal regularity of the superior right acetabular labrum diffusely.  Joint or bursal effusion  Joint effusion:  Mild-to-moderate right hip joint effusion.  Bursae: No trochanteric bursitis on either side.  Muscles and tendons  Muscles and tendons: The origins of the bilateral rectus femoris tendons are intact. Within the limitations of left-sided metallic susceptibility artifact, there appears to be mild fluid bright signal within the bilateral common hamstring tendon origins, possibly slightly greater on the left and compatible with tendinosis with tiny interstitial tears. No tendon retraction. The right gluteus minimus and medius tendon insertions are intact. The left sided gluteus minimus and medius tendon insertions are partially obscured by artifact. The bilateral iliopsoas tendon insertions appear intact.  Other findings  Miscellaneous: There is mild edema within the vastus intermedius muscle just anterior to the proximal femoral diaphysis (axial series 5, image 24), possibly muscle strain and/or secondary edema from the right femoroacetabular osteoarthritis.  Mild-to-moderate distal sigmoid diverticulosis.  IMPRESSION: Compared to 10/31/2020 MRI:  1. Worsening severe right femoroacetabular osteoarthritis, now with full-thickness cartilage loss throughout the majority of the anterior  superior quadrant of the femoral head. At the minimally anterior aspect of the  superior femoral head there is 1 mm cortical depression along a 10 mm AP dimension with high-grade underlying marrow edema. Findings suggest a focal subchondral right femoral head fracture. 2. Mild-to-moderate right hip joint effusion. 3. Mild bilateral common hamstring origin tendinosis with tiny interstitial tears. No tendon retraction. 4. Interval total left hip arthroplasty.  Electronically Signed   By: Yvonne Kendall M.D.   On: 05/07/2022 11:28   Procedures:  Large Joint Inj: R hip joint on 05/13/2022 10:38 AM Indications: pain Details: 22 G 3.5 in needle, ultrasound-guided anterior approach Medications: 4 mL lidocaine 1 %; 80 mg methylPREDNISolone acetate 40 MG/ML Outcome: tolerated well, no immediate complications  Procedure: US-guided intra-articular hip injection, right After discussion on risks/benefits/indications and informed verbal consent was obtained, a timeout was performed. Patient was lying supine on exam table. The hip was cleaned with betadine and alcohol swabs. Then utilizing ultrasound guidance, the patient's femoral head and neck junction was identified and subsequently injected with 4:2 lidocaine:depomedrol via an in-plane approach with ultrasound visualization of the injectate administered into the hip joint. Patient tolerated procedure well without immediate complications.  Procedure, treatment alternatives, risks and benefits explained, specific risks discussed. Consent was given by the patient. Immediately prior to procedure a time out was called to verify the correct patient, procedure, equipment, support staff and site/side marked as required. Patient was prepped and draped in the usual sterile fashion.     Plan: -Through shared decision-making, elected to proceed with ultrasound-guided right hip injection, patient tolerated well and did have some relief only from anesthetic portion - she will f/u with Dr. Ninfa Linden in 2 weeks to see response and have further  discussion on if THA is needed or further management.  Given the degree of edema within the bone she may need a trial of relative partial weightbearing or offloading with a cane, but we will leave further management to Dr. Ninfa Linden - ice, tylenol for any post-injection pain - I am happy to see her as needed  Elba Barman, DO [place]  This note was dictated using Dragon naturally speaking software and may contain errors in syntax, spelling, or content which have not been identified prior to signing this note.

## 2022-05-21 ENCOUNTER — Ambulatory Visit (INDEPENDENT_AMBULATORY_CARE_PROVIDER_SITE_OTHER): Payer: Medicare Other

## 2022-05-21 DIAGNOSIS — J309 Allergic rhinitis, unspecified: Secondary | ICD-10-CM | POA: Diagnosis not present

## 2022-05-22 ENCOUNTER — Encounter (HOSPITAL_BASED_OUTPATIENT_CLINIC_OR_DEPARTMENT_OTHER): Payer: Self-pay

## 2022-05-22 ENCOUNTER — Emergency Department (HOSPITAL_BASED_OUTPATIENT_CLINIC_OR_DEPARTMENT_OTHER)
Admission: EM | Admit: 2022-05-22 | Discharge: 2022-05-22 | Disposition: A | Discharge status: Home / Self Care | Payer: Medicare Other | Attending: Emergency Medicine | Admitting: Emergency Medicine

## 2022-05-22 ENCOUNTER — Emergency Department (HOSPITAL_BASED_OUTPATIENT_CLINIC_OR_DEPARTMENT_OTHER): Payer: Medicare Other

## 2022-05-22 DIAGNOSIS — R42 Dizziness and giddiness: Secondary | ICD-10-CM | POA: Insufficient documentation

## 2022-05-22 DIAGNOSIS — R002 Palpitations: Secondary | ICD-10-CM | POA: Insufficient documentation

## 2022-05-22 DIAGNOSIS — I10 Essential (primary) hypertension: Secondary | ICD-10-CM | POA: Insufficient documentation

## 2022-05-22 DIAGNOSIS — R0602 Shortness of breath: Secondary | ICD-10-CM | POA: Diagnosis not present

## 2022-05-22 LAB — LIPASE, BLOOD: Lipase: 34 U/L (ref 11–51)

## 2022-05-22 LAB — CBC WITH DIFFERENTIAL/PLATELET
Abs Immature Granulocytes: 0.04 10*3/uL (ref 0.00–0.07)
Basophils Absolute: 0 10*3/uL (ref 0.0–0.1)
Basophils Relative: 0 %
Eosinophils Absolute: 0.2 10*3/uL (ref 0.0–0.5)
Eosinophils Relative: 2 %
HCT: 35.8 % — ABNORMAL LOW (ref 36.0–46.0)
Hemoglobin: 11.6 g/dL — ABNORMAL LOW (ref 12.0–15.0)
Immature Granulocytes: 0 %
Lymphocytes Relative: 25 %
Lymphs Abs: 2.3 10*3/uL (ref 0.7–4.0)
MCH: 29.2 pg (ref 26.0–34.0)
MCHC: 32.4 g/dL (ref 30.0–36.0)
MCV: 90.2 fL (ref 80.0–100.0)
Monocytes Absolute: 0.6 10*3/uL (ref 0.1–1.0)
Monocytes Relative: 6 %
Neutro Abs: 6.1 10*3/uL (ref 1.7–7.7)
Neutrophils Relative %: 67 %
Platelets: 277 10*3/uL (ref 150–400)
RBC: 3.97 MIL/uL (ref 3.87–5.11)
RDW: 14.6 % (ref 11.5–15.5)
WBC: 9.3 10*3/uL (ref 4.0–10.5)
nRBC: 0 % (ref 0.0–0.2)

## 2022-05-22 LAB — COMPREHENSIVE METABOLIC PANEL
ALT: 12 U/L (ref 0–44)
AST: 16 U/L (ref 15–41)
Albumin: 3.4 g/dL — ABNORMAL LOW (ref 3.5–5.0)
Alkaline Phosphatase: 88 U/L (ref 38–126)
Anion gap: 9 (ref 5–15)
BUN: 28 mg/dL — ABNORMAL HIGH (ref 8–23)
CO2: 26 mmol/L (ref 22–32)
Calcium: 9 mg/dL (ref 8.9–10.3)
Chloride: 103 mmol/L (ref 98–111)
Creatinine, Ser: 1.01 mg/dL — ABNORMAL HIGH (ref 0.44–1.00)
GFR, Estimated: 58 mL/min — ABNORMAL LOW (ref >60)
Glucose, Bld: 101 mg/dL — ABNORMAL HIGH (ref 70–99)
Potassium: 4.1 mmol/L (ref 3.5–5.1)
Sodium: 138 mmol/L (ref 135–145)
Total Bilirubin: 0.4 mg/dL (ref 0.3–1.2)
Total Protein: 7 g/dL (ref 6.5–8.1)

## 2022-05-22 LAB — TROPONIN I (HIGH SENSITIVITY): Troponin I (High Sensitivity): 3 ng/L (ref <18)

## 2022-05-22 LAB — CBG MONITORING, ED: Glucose-Capillary: 96 mg/dL (ref 70–99)

## 2022-05-22 LAB — MAGNESIUM: Magnesium: 1.9 mg/dL (ref 1.7–2.4)

## 2022-05-22 MED ORDER — SODIUM CHLORIDE 0.9 % IV BOLUS
500.0000 mL | Freq: Once | INTRAVENOUS | Status: AC
  Administered 2022-05-22: 500 mL via INTRAVENOUS

## 2022-05-22 NOTE — Discharge Instructions (Signed)
Your lab work here is reassuring.  Please follow closely with your primary care physician and cardiology teams moving forward.  Return with any new or suddenly worsening symptoms.

## 2022-05-22 NOTE — ED Triage Notes (Signed)
Pt arrived POV. Pt presents with complaints of elevated BP 158/83 and HR up to 110 yesterday  . Woke up dizzy this morning. Only pain is chronic hip pain

## 2022-05-22 NOTE — ED Provider Notes (Signed)
Emergency Department Provider Note   I have reviewed the triage vital signs and the nursing notes.   HISTORY  Chief Complaint Dizziness   HPI YESMEEN TATLOCK is a 77 y.o. female past history reviewed below presents emergency department with lightheadedness, palpitations last night around 3 AM.  She has had these problems intermittently over the past several months.  She is followed with cardiology in the past but tells me no clear diagnosis is been made.  Denies any chest pain/pressure/tightness.  No shortness of breath.  She noticed today with her lightheadedness that her blood pressure had gone very high and was feeling uncomfortable.  No vertigo.  No unilateral weakness or numbness.  No vision changes.  No severe headaches.   Past Medical History:  Diagnosis Date   Achalasia 09/28/2012   Allergic rhinitis 03/01/2015   Anemia    Aneurysm 03/01/2015   stable 5 mm periopthalmic right ICA aneurysm 03/25/21 CTA   BP (high blood pressure) 03/01/2015   Bronchitis 04/23/2018   Cephalalgia 08/24/2012   Overview:  ICD-10 cut over     Chest pain 04/04/2011   Coronary Ca Score 0, no significant CAD 08/20/18; CP with troponin 4>32 s/p LHC showing normal coronaries 12/29/18   CN (constipation) 09/28/2012   Coughing 04/24/2017   Decreased potassium in the blood 03/01/2015   Diverticulosis    Dizziness 03/01/2015   Erythropoietin deficiency anemia 03/13/2021   GERD (gastroesophageal reflux disease)    Hiatal hernia    Hypercholesterolemia 03/01/2015   Hyperlipidemia    Hypertension    Ingrown toenail 08/10/2017   Inguinal hernia    Iron deficiency anemia 03/13/2021   Migraine headache    Onychomycosis 12/08/2017   Paresthesia of arm 03/01/2015   PE (pulmonary thromboembolism) 03/13/2020   in setting of + COVID-19, s/p Eliquis x 3 month   Schatzki's ring    Tendonitis, Achilles, right 12/08/2017   Unilateral primary osteoarthritis, left knee 10/07/2017    Review of  Systems  Constitutional: No fever/chills. Positive lightheadedness.  Cardiovascular: Denies chest pain. Positive palpitations.  Respiratory: Denies shortness of breath. Gastrointestinal: No abdominal pain.  No nausea, no vomiting.  No diarrhea.  No constipation. Genitourinary: Negative for dysuria. Musculoskeletal: Negative for back pain. Skin: Negative for rash. Neurological: Negative for headaches, focal weakness or numbness.  ____________________________________________   PHYSICAL EXAM:  VITAL SIGNS: ED Triage Vitals [05/22/22 0727]  Enc Vitals Group     BP (!) 147/73     Pulse Rate 89     Resp 18     Temp 98.8 F (37.1 C)     Temp src      SpO2 100 %     Weight 222 lb (100.7 kg)     Height 5\' 4"  (1.626 m)    Constitutional: Alert and oriented. Well appearing and in no acute distress. Eyes: Conjunctivae are normal. PERRL.  Head: Atraumatic. Nose: No congestion/rhinnorhea. Mouth/Throat: Mucous membranes are moist.  Neck: No stridor.   Cardiovascular: Normal rate, regular rhythm. Good peripheral circulation. Grossly normal heart sounds.   Respiratory: Normal respiratory effort.  No retractions. Lungs CTAB. Gastrointestinal: Soft and nontender. No distention.  Musculoskeletal: No lower extremity tenderness nor edema. No gross deformities of extremities. Neurologic:  Normal speech and language. No gross focal neurologic deficits are appreciated.  Skin:  Skin is warm, dry and intact. No rash noted.  ____________________________________________   LABS (all labs ordered are listed, but only abnormal results are displayed)  Labs Reviewed  COMPREHENSIVE  METABOLIC PANEL - Abnormal; Notable for the following components:      Result Value   Glucose, Bld 101 (*)    BUN 28 (*)    Creatinine, Ser 1.01 (*)    Albumin 3.4 (*)    GFR, Estimated 58 (*)    All other components within normal limits  CBC WITH DIFFERENTIAL/PLATELET - Abnormal; Notable for the following  components:   Hemoglobin 11.6 (*)    HCT 35.8 (*)    All other components within normal limits  LIPASE, BLOOD  MAGNESIUM  CBG MONITORING, ED  TROPONIN I (HIGH SENSITIVITY)  TROPONIN I (HIGH SENSITIVITY)   ____________________________________________  EKG   EKG Interpretation  Date/Time:  Wednesday May 22 2022 07:27:20 EDT Ventricular Rate:  91 PR Interval:  186 QRS Duration: 100 QT Interval:  346 QTC Calculation: 424 R Axis:   -3 Text Interpretation: Sinus rhythm Low voltage, precordial leads Confirmed by Nanda Quinton [phone]) on 05/22/2022 7:34:55 AM        ____________________________________________   PROCEDURES  Procedure(s) performed:   Procedures  None  ____________________________________________   INITIAL IMPRESSION / ASSESSMENT AND PLAN / ED COURSE  Pertinent labs & imaging results that were available during my care of the patient were reviewed by me and considered in my medical decision making (see chart for details).   This patient is Presenting for Evaluation of palpitations, which does require a range of treatment options, and is a complaint that involves a high risk of morbidity and mortality.  The Differential Diagnoses includes but is not exclusive to acute coronary syndrome, aortic dissection, pulmonary embolism, cardiac tamponade, community-acquired pneumonia, pericarditis, musculoskeletal chest wall pain, etc.   Critical Interventions-    Medications  sodium chloride 0.9 % bolus 500 mL ( Intravenous Stopped 05/22/22 0929)    Reassessment after intervention:  symptoms improved.   I decided to review pertinent External Data, and in summary patient follows with [facility] with last visit in 12/2021.    Clinical Laboratory Tests Ordered, included troponin negative. Mg and K normal. No AKI. No severe anemia.   Radiologic Tests Ordered, included CXR. I independently interpreted the images and agree with radiology interpretation.   Cardiac  Monitor Tracing which shows NSR.    Social Determinants of Health Risk patient is not an active smoker.   Medical Decision Making: Summary:  Patient presents to the emergency department for evaluation of palpitations with lightheadedness.  No focal neurodeficits on exam.  EKG without acute ischemic change or arrhythmia.  Plan for screening blood work.  She has been seen by cardiology for this in the past and apparently worn a monitor undergone echo which were unremarkable.  She is compliant with her home medications.  Blood pressure is only mildly elevated here.  Lower suspicion overall for ACS or PE although considered.  Reevaluation with update and discussion with patient. Labs are reassuring. No focal deficits to suspect CVA. Patient with some lightheadedness upon standing but no severe hypotension or tachycardia. Advised continued PO intake and close PCP and Cardiology follow up. Unclear if additional ambulatory monitoring would be helpful vs investigation for alternate etiology.   Considered admission but symptoms and workup are reassuring.   Patient's presentation is most consistent with acute presentation with potential threat to life or bodily function.   Disposition: discharge  ____________________________________________  FINAL CLINICAL IMPRESSION(S) / ED DIAGNOSES  Final diagnoses:  Lightheadedness  Palpitations    Note:  This document was prepared using Dragon voice recognition software and  may include unintentional dictation errors.  Nanda Quinton, MD, [facility]    Kenard Morawski, [facility], MD 05/23/22 [phone]

## 2022-05-29 ENCOUNTER — Encounter: Payer: Self-pay | Admitting: Orthopaedic Surgery

## 2022-05-29 ENCOUNTER — Ambulatory Visit (INDEPENDENT_AMBULATORY_CARE_PROVIDER_SITE_OTHER): Payer: Medicare Other | Admitting: Orthopaedic Surgery

## 2022-05-29 DIAGNOSIS — M1611 Unilateral primary osteoarthritis, right hip: Secondary | ICD-10-CM | POA: Diagnosis not present

## 2022-05-29 DIAGNOSIS — M25551 Pain in right hip: Secondary | ICD-10-CM | POA: Diagnosis not present

## 2022-05-29 MED ORDER — TRAMADOL HCL 50 MG PO TABS: 100.0000 mg | ORAL_TABLET | Freq: Two times a day (BID) | ORAL | 0 refills | Status: DC | PRN

## 2022-05-29 NOTE — Progress Notes (Signed)
The patient is well-known to me.  She is 77 years old and we did replace her left hip in July of last year secondary to severe arthritis.  She is very active and still works.  She has been ambulating with a cane and having worsening right hip pain.  My partner Dr. Shon Baton did provide an intra-articular injection in March in that right hip and it helped for about a week.  She returns today after having an MRI of her right hip.  The MRI of the right hip does show now there is a full-thickness cartilage loss and a wide area over the weightbearing surface of the right hip and this is definitely worsened.  On exam her right hip shows significant pain with internal and external rotation.  The left total hip moves smoothly with no pain at all.  At this point we are recommending proceeding with a hip replacement on the right hip given the worsening pain combined with her clinical exam findings and MRI findings.  Also she has failed conservative treatment with even an intra-articular steroid injection as well as activity modification and hip strengthening exercises.  She will need to be out of work until further notice and then after surgery will likely need to be out of work for 6 to 12 weeks as she recovers from surgery.  We will work on getting this scheduled.

## 2022-05-30 ENCOUNTER — Telehealth: Payer: Self-pay | Admitting: Interventional Cardiology

## 2022-05-30 NOTE — Telephone Encounter (Signed)
Patient c/o Palpitations:  High priority if patient c/o lightheadedness, shortness of breath, or chest pain  How long have you had palpitations/irregular HR/ Afib? Are you having the symptoms now?  Palpitations past few weeks, no symptoms currently  Are you currently experiencing lightheadedness, SOB or CP?  Patient is currently lightheaded  Do you have a history of afib (atrial fibrillation) or irregular heart rhythm?  Patient states she has not had any issues until last year   Have you checked your BP or HR? (document readings if available):  HR ranging 99-117 BP 135/78  Are you experiencing any other symptoms?  Lightheadedness

## 2022-05-30 NOTE — Telephone Encounter (Signed)
Can plan for 2 week zio patch

## 2022-05-30 NOTE — Telephone Encounter (Signed)
Patient called reporting symptoms, lightheaded and heart racing for a while. Sometimes she has chest pain. As of today she is experiencing feeling lightheaded. She was seen in the ED on 4/3, was advised to follow up with PCP and cardiology. Pt. Monitors her bp every other day. Patient has scheduled OV with Dr. Eldridge Dace on 4/16. Explained ED precautions, patient voiced understanding. Will forward to MD and nurse.  Blood pressure  4/11 138/58 HR 108 4/9 139/76 HR 99

## 2022-06-03 ENCOUNTER — Ambulatory Visit: Payer: Medicare Other | Admitting: Orthopaedic Surgery

## 2022-06-04 ENCOUNTER — Ambulatory Visit: Payer: Medicare Other | Attending: Interventional Cardiology | Admitting: Interventional Cardiology

## 2022-06-04 ENCOUNTER — Ambulatory Visit (INDEPENDENT_AMBULATORY_CARE_PROVIDER_SITE_OTHER): Payer: Medicare Other

## 2022-06-04 ENCOUNTER — Encounter: Payer: Self-pay | Admitting: Interventional Cardiology

## 2022-06-04 VITALS — BP 116/62 | HR 100 | Ht 64.0 in | Wt 221.8 lb

## 2022-06-04 DIAGNOSIS — R002 Palpitations: Secondary | ICD-10-CM | POA: Diagnosis not present

## 2022-06-04 DIAGNOSIS — I10 Essential (primary) hypertension: Secondary | ICD-10-CM | POA: Diagnosis not present

## 2022-06-04 DIAGNOSIS — J309 Allergic rhinitis, unspecified: Secondary | ICD-10-CM

## 2022-06-04 DIAGNOSIS — R600 Localized edema: Secondary | ICD-10-CM

## 2022-06-04 NOTE — Patient Instructions (Signed)
Medication Instructions:  Your physician recommends that you continue on your current medications as directed. Please refer to the Current Medication list given to you today.  *If you need a refill on your cardiac medications before your next appointment, please call your pharmacy*   Lab Work: none If you have labs (blood work) drawn today and your tests are completely normal, you will receive your results only by: MyChart Message (if you have MyChart) OR A paper copy in the mail If you have any lab test that is abnormal or we need to change your treatment, we will call you to review the results.   Testing/Procedures: Dr Eldridge Dace recommends you wear a 2 week zio monitor   Follow-Up: At [facility], you and your health needs are our priority.  As part of our continuing mission to provide you with exceptional heart care, we have created designated Provider Care Teams.  These Care Teams include your primary Cardiologist (physician) and Advanced Practice Providers (APPs -  Physician Assistants and Nurse Practitioners) who all work together to provide you with the care you need, when you need it.  We recommend signing up for the patient portal called "MyChart".  Sign up information is provided on this After Visit Summary.  MyChart is used to connect with patients for Virtual Visits (Telemedicine).  Patients are able to view lab/test results, encounter notes, upcoming appointments, etc.  Non-urgent messages can be sent to your provider as well.   To learn more about what you can do with MyChart, go to [email or web address].    Your next appointment:   November 2024  Provider:   Lance Muss, MD     Other Instructions Stephanie Parks- Long Term Monitor Instructions  Your physician has requested you wear a ZIO patch monitor for 14 days.  This is a single patch monitor. [employer] supplies one patch monitor per enrollment. Additional stickers are not available. Please do not apply  patch if you will be having a Nuclear Stress Test,  Echocardiogram, Cardiac CT, MRI, or Chest Xray during the period you would be wearing the  monitor. The patch cannot be worn during these tests. You cannot remove and re-apply the  ZIO XT patch monitor.  Your ZIO patch monitor will be mailed 3 day USPS to your address on file. It may take 3-5 days  to receive your monitor after you have been enrolled.  Once you have received your monitor, please review the enclosed instructions. Your monitor  has already been registered assigning a specific monitor serial # to you.  Billing and Patient Assistance Program Information  We have supplied [employer] with any of your insurance information on file for billing purposes. [employer] offers a sliding scale Patient Assistance Program for patients that do not have  insurance, or whose insurance does not completely cover the cost of the ZIO monitor.  You must apply for the Patient Assistance Program to qualify for this discounted rate.  To apply, please call [employer] at [phone], select option 4, select option 2, ask to apply for  Patient Assistance Program. Stephanie Parks will ask your household income, and how many people  are in your household. They will quote your out-of-pocket cost based on that information.  [employer] will also be able to set up a 51-month, interest-free payment plan if needed.  Applying the monitor   Shave hair from upper left chest.  Hold abrader disc by orange tab. Rub abrader in 40 strokes over the upper left chest as  indicated  in your monitor instructions.  Clean area with 4 enclosed alcohol pads. Let dry.  Apply patch as indicated in monitor instructions. Patch will be placed under collarbone on left  side of chest with arrow pointing upward.  Rub patch adhesive wings for 2 minutes. Remove white label marked "1". Remove the white  label marked "2". Rub patch adhesive wings for 2 additional minutes.  While looking in a mirror, press  and release button in center of patch. A small green light will  flash 3-4 times. This will be your only indicator that the monitor has been turned on.  Do not shower for the first 24 hours. You may shower after the first 24 hours.  Press the button if you feel a symptom. You will hear a small click. Record Date, Time and  Symptom in the Patient Logbook.  When you are ready to remove the patch, follow instructions on the last 2 pages of Patient  Logbook. Stick patch monitor onto the last page of Patient Logbook.  Place Patient Logbook in the blue and white box. Use locking tab on box and tape box closed  securely. The blue and white box has prepaid postage on it. Please place it in the mailbox as  soon as possible. Your physician should have your test results approximately 7 days after the  monitor has been mailed back to [facility].  Call [facility] Customer Care at [phone] if you have questions regarding  your ZIO XT patch monitor. Call them immediately if you see an orange light blinking on your  monitor.  If your monitor falls off in less than 4 days, contact our Monitor department at [phone].  If your monitor becomes loose or falls off after 4 days call [employer] at [phone] for  suggestions on securing your monitor

## 2022-06-04 NOTE — Progress Notes (Unsigned)
Enrolled patient for a 14 day Zio XT  monitor to be mailed to patients home  °

## 2022-06-04 NOTE — Progress Notes (Signed)
Cardiology Office Note   Date:  06/04/2022   ID:  Stephanie Parks, Stephanie Parks 1945/12/23, MRN [id number]  PCP:  Pearline Cables, MD    No chief complaint on file.  palpitations  Wt Readings from Last 3 Encounters:  06/04/22 221 lb 12.8 oz (100.6 kg)  05/22/22 222 lb (100.7 kg)  04/03/22 228 lb (103.4 kg)       History of Present Illness: Stephanie Parks is a 77 y.o. female with extensive history of cardiac testing who has had HTN and hyperlipidemia.  She has a h/o brain aneurysm.  She had a cath many years ago and no PCI was needed.     She has had palpitations, monitor in 2018 showed: Normal sinus rhythm with occasional PACs and PVCs. Occasional sinus tachycardia noted. Patient's symptoms of fluttering did not correspond to an arrhythmia.   SHe has had atypical chest discomfort in the past.  She declined stress test as symptoms were mild and quite atypical including improving with exercise and belching.   Lovastatin was switched for Crestor in 2020.    12/2018 echo essentially normal at [facility].   Cath in 2020 at [facility]: "LHC and coronary angiogram via R CFA (accessed R radial but severe  tortuosity of R SCA and unable to wire into Ao so transitioned to femoral)  for chest pain (suspect the positive troponin was lab error).  LM engaged,  normal.  LAD normal, LCx normal.  RCA engaged, normal.  Angiogram of  innominate performed, no complication from attempted wiring via radial,  severe tortuosity confirmed; " LVEDP 20 mm Hg.  11/2019 monitor showed: "Normal sinus rhythm with rare PACs and PVCs, none of which correlated to symptoms. No pathologic arrhythmias. No atrial fibrillation."   Main stress is working at [employer]. She is frustrated that everything has been ok on several ER trips.    She has made several trips to the urgent care with negative w/us both at [facility] and [address].  She continues to have chest pain. She reports left arm pain and left leg pain.   She noted that the pain is reduced with belching. She has an appt with pulmonary.     Admits to getting panicked with seeing high BP readings.    In 04/2020: "We spoke about deep breathing and relaxation techniques as well.  I think anxiety plays a large role in her symptoms.  Will defer to PCP to see if SSRI would be helpful."   She wanted to have left hip replacement in November 2022 with Dr. Magnus Ivan.  Her blood pressure needed better controlled to have surgery.  She was seen in our Pharm.D. hypertension clinic several times.  She ultimately had her left hip surgery in the summer 2023. Ultimately, blood pressure was controlled on losartan 100 mg daily and spironolactone 25 mg daily.  Although, her heart rate was somewhat elevated, she declined medication.   She has had increased HR for the past few months.  She was concerned about meds to slow HR since her mother passed away on such a medicine.    In 2023: "Echo showed normal LV function in February 2023.  Mild anemia in 8/23.   Now with some right ankle pain and swelling. "  Seen for dizziness and palpitations in early April 2024.  Negative workup in the ER and she was sent home.  Has had negative monitor in the past.  She has blamed metoprolol for palpitations in the past as  well.  Still feels tired.  Feels some palpitations.  .    Past Medical History:  Diagnosis Date   Achalasia 09/28/2012   Allergic rhinitis 03/01/2015   Anemia    Aneurysm 03/01/2015   stable 5 mm periopthalmic right ICA aneurysm 03/25/21 CTA   BP (high blood pressure) 03/01/2015   Bronchitis 04/23/2018   Cephalalgia 08/24/2012   Overview:  ICD-10 cut over     Chest pain 04/04/2011   Coronary Ca Score 0, no significant CAD 08/20/18; CP with troponin 4>32 s/p LHC showing normal coronaries 12/29/18   CN (constipation) 09/28/2012   Coughing 04/24/2017   Decreased potassium in the blood 03/01/2015   Diverticulosis    Dizziness 03/01/2015   Erythropoietin  deficiency anemia 03/13/2021   GERD (gastroesophageal reflux disease)    Hiatal hernia    Hypercholesterolemia 03/01/2015   Hyperlipidemia    Hypertension    Ingrown toenail 08/10/2017   Inguinal hernia    Iron deficiency anemia 03/13/2021   Migraine headache    Onychomycosis 12/08/2017   Paresthesia of arm 03/01/2015   PE (pulmonary thromboembolism) 03/13/2020   in setting of + COVID-19, s/p Eliquis x 3 month   Schatzki's ring    Tendonitis, Achilles, right 12/08/2017   Unilateral primary osteoarthritis, left knee 10/07/2017    Past Surgical History:  Procedure Laterality Date   ABDOMINAL HYSTERECTOMY     BACK SURGERY     x 3   CARDIAC CATHETERIZATION  12/29/2018   angiographically normal coronary arteries 12/29/18 ([place])   CARPAL TUNNEL RELEASE     left wrist   CERVICAL SPINE SURGERY     HEMORRHOID SURGERY     TOTAL HIP ARTHROPLASTY Left 09/04/2021   Procedure: LEFT TOTAL HIP ARTHROPLASTY ANTERIOR APPROACH;  Surgeon: Kathryne Hitch, MD;  Location: [facility] OR;  Service: Orthopedics;  Laterality: Left;     Current Outpatient Medications  Medication Sig Dispense Refill   Azelastine HCl 0.15 % SOLN daily as needed.     Calcium Carb-Cholecalciferol (CALCIUM 600 + D PO) Take 1 tablet by mouth daily.     carboxymethylcellulose (REFRESH PLUS) 0.5 % SOLN Place 1 drop into both eyes 3 (three) times daily as needed (dry eyes).     ELDERBERRY PO Take 1 capsule by mouth daily.     EPINEPHrine (EPIPEN 2-PAK) 0.3 mg/0.3 mL IJ SOAJ injection Inject 0.3 mg into the muscle as needed for anaphylaxis. Per allergen immunotherapy protocol 1 each 2   fluticasone (FLONASE) 50 MCG/ACT nasal spray Place 2 sprays into both nostrils daily as needed for allergies. 48 g 3   KLOR-CON M20 20 MEQ tablet TAKE 2 TABLETS BY MOUTH EVERY DAY 180 tablet 1   losartan (COZAAR) 100 MG tablet Take 0.5 tablets (50 mg total) by mouth daily. 45 tablet 3   Multiple Vitamins-Minerals (ALIVE MULTI-VITAMIN PO)  Take 1 tablet by mouth daily.     NON FORMULARY Pt uses a cpap nightly     nortriptyline (PAMELOR) 25 MG capsule Take 2 capsules (50 mg total) by mouth at bedtime. 180 capsule 3   omeprazole (PRILOSEC) 40 MG capsule Take 40 mg by mouth daily.     polyethylene glycol (MIRALAX) 17 g packet Take 17 g by mouth daily. 14 each 0   rosuvastatin (CRESTOR) 10 MG tablet TAKE 1 TABLET BY MOUTH EVERY DAY 90 tablet 3   spironolactone (ALDACTONE) 25 MG tablet Take 12.5 mg by mouth daily. Pt takes 1/2 tablet 12.5 mg daily.  traMADol (ULTRAM) 50 MG tablet Take 2 tablets (100 mg total) by mouth every 12 (twelve) hours as needed. 30 tablet 0   HYDROcodone-acetaminophen (NORCO/VICODIN) 5-325 MG tablet Take 1 tablet by mouth every 4 (four) hours as needed. (Patient not taking: Reported on 06/04/2022) 10 tablet 0   MAGNESIUM PO Take 1 tablet by mouth daily. (Patient not taking: Reported on 06/04/2022)     meloxicam (MOBIC) 15 MG tablet Take 1 tablet (15 mg total) by mouth daily. (Patient not taking: Reported on 06/04/2022) 30 tablet 0   methocarbamol (ROBAXIN) 500 MG tablet Take 1 tablet (500 mg total) by mouth 3 (three) times daily. (Patient not taking: Reported on 06/04/2022) 40 tablet 0   senna-docusate (SENOKOT-S) 8.6-50 MG tablet Take 1 tablet by mouth daily. (Patient not taking: Reported on 06/04/2022) 30 tablet 0   zinc gluconate 50 MG tablet Take 50 mg by mouth daily. (Patient not taking: Reported on 06/04/2022)     No current facility-administered medications for this visit.    Allergies:   Shellfish allergy, Ace inhibitors, Gabapentin, Pitavastatin, Sulfa antibiotics, and Topiramate    Social History:  The patient  reports that she quit smoking about 38 years ago. Her smoking use included cigarettes. She has a 10.00 pack-year smoking history. She has never used smokeless tobacco. She reports that she does not drink alcohol and does not use drugs.   Family History:  The patient's family history includes  Allergic rhinitis in her sister; Diabetes in her sister; Heart attack (age of onset: 57) in her father; Heart disease in her father and mother; Hypertension in her sister; Stomach cancer in her maternal grandmother.    ROS:  Please see the history of present illness.   Otherwise, review of systems are positive for palpitations.   All other systems are reviewed and negative.    PHYSICAL EXAM: VS:  BP 116/62   Pulse 100   Ht 5\' 4"  (1.626 m)   Wt 221 lb 12.8 oz (100.6 kg)   SpO2 98%   BMI 38.07 kg/m  , BMI Body mass index is 38.07 kg/m. GEN: Well nourished, well developed, in no acute distress HEENT: normal Neck: no JVD, carotid bruits, or masses Cardiac: RRR; no murmurs, rubs, or gallops,no edema  Respiratory:  clear to auscultation bilaterally, normal work of breathing GI: soft, nontender, nondistended, + BS MS: no deformity or atrophy Skin: warm and dry, no rash Neuro:  Strength and sensation are intact Psych: euthymic mood, full affect   EKG:   The ekg ordered today demonstrates NSR, LAD, no ST changes   Recent Labs: 06/20/2021: Pro B Natriuretic peptide (BNP) 28.0 12/25/2021: TSH 2.520 02/06/2022: B Natriuretic Peptide 33.7 05/22/2022: ALT 12; BUN 28; Creatinine, Ser 1.01; Hemoglobin 11.6; Magnesium 1.9; Platelets 277; Potassium 4.1; Sodium 138   Lipid Panel    Component Value Date/Time   CHOL 161 06/02/2019 1439   CHOL 176 07/28/2018 0756   TRIG 98 03/12/2020 1503   HDL 56.00 06/02/2019 1439   HDL 73 07/28/2018 0756   CHOLHDL 3 06/02/2019 1439   VLDL 17.2 06/02/2019 1439   LDLCALC 87 06/02/2019 1439   LDLCALC 93 07/28/2018 0756     Other studies Reviewed: Additional studies/ records that were reviewed today with results demonstrating: LDL 87 in 4/21; A1C 5.9 1/23 .   ASSESSMENT AND PLAN:  HTN: Continue current blood pressure medicines.  Will try to clarify with Dr. Patsy Lager regarding dosages as she received a different dosages in the mail recently.  Home readings  well controlled. BP Should be ok for hip surgery. Palpitations: Worse when she has pain.  I suspect this is sinus tach.    2 week Zio patch as sx are worsening.  Hoping to have hip replacement.  Reports fatigue.  May be related to deconditioning from lack of activity due to hip pain.  No signs of congestive heart failure. LE edema: elevate legs. Somewhat frustrated that her tests come back normal and she still feels tired.    Current medicines are reviewed at length with the patient today.  The patient concerns regarding her medicines were addressed.  The following changes have been made:  No change  Labs/ tests ordered today include:  No orders of the defined types were placed in this encounter.   Recommend 150 minutes/week of aerobic exercise Low fat, low carb, high fiber diet recommended  Disposition:   FU in as scheduled in 11/24   Signed, Lance Muss, MD  06/04/2022 11:43 AM    [facility] [address] [place], [place], [place]  [address] Phone: [phone]; Fax: [phone]

## 2022-06-06 ENCOUNTER — Telehealth: Payer: Self-pay | Admitting: Orthopaedic Surgery

## 2022-06-06 DIAGNOSIS — R002 Palpitations: Secondary | ICD-10-CM

## 2022-06-06 NOTE — Telephone Encounter (Signed)
Patient called in stating the Tramadol she was given is causing her itching, headaches and dizziness , can we please send in a different medication for pain and add these side effects to her chart so we know for future reference. Please advise

## 2022-06-06 NOTE — Telephone Encounter (Signed)
I called and advised pt. She stated understanding  

## 2022-06-07 ENCOUNTER — Other Ambulatory Visit: Payer: Self-pay

## 2022-06-07 ENCOUNTER — Encounter (HOSPITAL_BASED_OUTPATIENT_CLINIC_OR_DEPARTMENT_OTHER): Payer: Self-pay | Admitting: Emergency Medicine

## 2022-06-07 ENCOUNTER — Observation Stay (HOSPITAL_BASED_OUTPATIENT_CLINIC_OR_DEPARTMENT_OTHER)
Admission: EM | Admit: 2022-06-07 | Discharge: 2022-06-09 | Disposition: A | Discharge status: Home / Self Care | Payer: Medicare Other | Attending: Internal Medicine | Admitting: Internal Medicine

## 2022-06-07 DIAGNOSIS — Z79899 Other long term (current) drug therapy: Secondary | ICD-10-CM | POA: Diagnosis not present

## 2022-06-07 DIAGNOSIS — Z87891 Personal history of nicotine dependence: Secondary | ICD-10-CM | POA: Diagnosis not present

## 2022-06-07 DIAGNOSIS — E876 Hypokalemia: Secondary | ICD-10-CM | POA: Diagnosis not present

## 2022-06-07 DIAGNOSIS — Z86711 Personal history of pulmonary embolism: Secondary | ICD-10-CM | POA: Insufficient documentation

## 2022-06-07 DIAGNOSIS — G459 Transient cerebral ischemic attack, unspecified: Secondary | ICD-10-CM | POA: Diagnosis not present

## 2022-06-07 DIAGNOSIS — Z96642 Presence of left artificial hip joint: Secondary | ICD-10-CM | POA: Diagnosis not present

## 2022-06-07 DIAGNOSIS — Z8616 Personal history of COVID-19: Secondary | ICD-10-CM | POA: Insufficient documentation

## 2022-06-07 DIAGNOSIS — I2510 Atherosclerotic heart disease of native coronary artery without angina pectoris: Secondary | ICD-10-CM | POA: Insufficient documentation

## 2022-06-07 DIAGNOSIS — I10 Essential (primary) hypertension: Secondary | ICD-10-CM | POA: Diagnosis not present

## 2022-06-07 DIAGNOSIS — R55 Syncope and collapse: Secondary | ICD-10-CM | POA: Diagnosis not present

## 2022-06-07 DIAGNOSIS — E669 Obesity, unspecified: Secondary | ICD-10-CM | POA: Diagnosis present

## 2022-06-07 DIAGNOSIS — R7303 Prediabetes: Secondary | ICD-10-CM | POA: Diagnosis present

## 2022-06-07 DIAGNOSIS — G4733 Obstructive sleep apnea (adult) (pediatric): Secondary | ICD-10-CM | POA: Diagnosis present

## 2022-06-07 DIAGNOSIS — R519 Headache, unspecified: Secondary | ICD-10-CM | POA: Insufficient documentation

## 2022-06-07 DIAGNOSIS — R002 Palpitations: Secondary | ICD-10-CM | POA: Diagnosis present

## 2022-06-07 DIAGNOSIS — E785 Hyperlipidemia, unspecified: Secondary | ICD-10-CM | POA: Diagnosis present

## 2022-06-07 DIAGNOSIS — I729 Aneurysm of unspecified site: Secondary | ICD-10-CM | POA: Diagnosis present

## 2022-06-07 DIAGNOSIS — R29818 Other symptoms and signs involving the nervous system: Secondary | ICD-10-CM | POA: Diagnosis present

## 2022-06-07 DIAGNOSIS — I5189 Other ill-defined heart diseases: Secondary | ICD-10-CM

## 2022-06-07 LAB — CBG MONITORING, ED: Glucose-Capillary: 99 mg/dL (ref 70–99)

## 2022-06-07 NOTE — ED Triage Notes (Signed)
Patient arrived via POV c/o sudden onset headache, dizziness, blurry vision and left arm weakness occurring at 1900. Patient states blurry vision and arm weakness have resolved, but still headache and dizzy. Patient is AO x 4, VS WDL, normal gait.

## 2022-06-08 ENCOUNTER — Encounter (HOSPITAL_BASED_OUTPATIENT_CLINIC_OR_DEPARTMENT_OTHER): Payer: Self-pay

## 2022-06-08 ENCOUNTER — Observation Stay (HOSPITAL_COMMUNITY): Payer: Medicare Other

## 2022-06-08 ENCOUNTER — Emergency Department (HOSPITAL_BASED_OUTPATIENT_CLINIC_OR_DEPARTMENT_OTHER): Payer: Medicare Other

## 2022-06-08 DIAGNOSIS — I729 Aneurysm of unspecified site: Secondary | ICD-10-CM

## 2022-06-08 DIAGNOSIS — Z8616 Personal history of COVID-19: Secondary | ICD-10-CM | POA: Diagnosis not present

## 2022-06-08 DIAGNOSIS — R519 Headache, unspecified: Secondary | ICD-10-CM

## 2022-06-08 DIAGNOSIS — I5189 Other ill-defined heart diseases: Secondary | ICD-10-CM | POA: Diagnosis not present

## 2022-06-08 DIAGNOSIS — G459 Transient cerebral ischemic attack, unspecified: Secondary | ICD-10-CM | POA: Diagnosis present

## 2022-06-08 DIAGNOSIS — I10 Essential (primary) hypertension: Secondary | ICD-10-CM | POA: Diagnosis not present

## 2022-06-08 DIAGNOSIS — I2510 Atherosclerotic heart disease of native coronary artery without angina pectoris: Secondary | ICD-10-CM | POA: Diagnosis not present

## 2022-06-08 DIAGNOSIS — E785 Hyperlipidemia, unspecified: Secondary | ICD-10-CM | POA: Diagnosis not present

## 2022-06-08 DIAGNOSIS — R29818 Other symptoms and signs involving the nervous system: Secondary | ICD-10-CM | POA: Diagnosis present

## 2022-06-08 DIAGNOSIS — Z96642 Presence of left artificial hip joint: Secondary | ICD-10-CM | POA: Diagnosis not present

## 2022-06-08 DIAGNOSIS — E669 Obesity, unspecified: Secondary | ICD-10-CM

## 2022-06-08 DIAGNOSIS — I639 Cerebral infarction, unspecified: Secondary | ICD-10-CM | POA: Diagnosis not present

## 2022-06-08 DIAGNOSIS — Z79899 Other long term (current) drug therapy: Secondary | ICD-10-CM | POA: Diagnosis not present

## 2022-06-08 DIAGNOSIS — Z87891 Personal history of nicotine dependence: Secondary | ICD-10-CM | POA: Diagnosis not present

## 2022-06-08 DIAGNOSIS — G4733 Obstructive sleep apnea (adult) (pediatric): Secondary | ICD-10-CM | POA: Diagnosis not present

## 2022-06-08 DIAGNOSIS — E876 Hypokalemia: Secondary | ICD-10-CM

## 2022-06-08 DIAGNOSIS — Z86711 Personal history of pulmonary embolism: Secondary | ICD-10-CM | POA: Diagnosis not present

## 2022-06-08 DIAGNOSIS — R7303 Prediabetes: Secondary | ICD-10-CM

## 2022-06-08 DIAGNOSIS — R002 Palpitations: Secondary | ICD-10-CM

## 2022-06-08 DIAGNOSIS — R55 Syncope and collapse: Secondary | ICD-10-CM | POA: Diagnosis not present

## 2022-06-08 LAB — CBC WITH DIFFERENTIAL/PLATELET
Abs Immature Granulocytes: 0.03 10*3/uL (ref 0.00–0.07)
Basophils Absolute: 0 10*3/uL (ref 0.0–0.1)
Basophils Relative: 0 %
Eosinophils Absolute: 0.3 10*3/uL (ref 0.0–0.5)
Eosinophils Relative: 3 %
HCT: 33.9 % — ABNORMAL LOW (ref 36.0–46.0)
Hemoglobin: 11 g/dL — ABNORMAL LOW (ref 12.0–15.0)
Immature Granulocytes: 0 %
Lymphocytes Relative: 35 %
Lymphs Abs: 2.9 10*3/uL (ref 0.7–4.0)
MCH: 29.3 pg (ref 26.0–34.0)
MCHC: 32.4 g/dL (ref 30.0–36.0)
MCV: 90.2 fL (ref 80.0–100.0)
Monocytes Absolute: 0.5 10*3/uL (ref 0.1–1.0)
Monocytes Relative: 6 %
Neutro Abs: 4.6 10*3/uL (ref 1.7–7.7)
Neutrophils Relative %: 56 %
Platelets: 308 10*3/uL (ref 150–400)
RBC: 3.76 MIL/uL — ABNORMAL LOW (ref 3.87–5.11)
RDW: 14.4 % (ref 11.5–15.5)
WBC: 8.2 10*3/uL (ref 4.0–10.5)
nRBC: 0 % (ref 0.0–0.2)

## 2022-06-08 LAB — BASIC METABOLIC PANEL
Anion gap: 9 (ref 5–15)
BUN: 11 mg/dL (ref 8–23)
CO2: 26 mmol/L (ref 22–32)
Calcium: 8.8 mg/dL — ABNORMAL LOW (ref 8.9–10.3)
Chloride: 103 mmol/L (ref 98–111)
Creatinine, Ser: 0.96 mg/dL (ref 0.44–1.00)
GFR, Estimated: >60 mL/min (ref >60)
Glucose, Bld: 108 mg/dL — ABNORMAL HIGH (ref 70–99)
Potassium: 3.4 mmol/L — ABNORMAL LOW (ref 3.5–5.1)
Sodium: 138 mmol/L (ref 135–145)

## 2022-06-08 LAB — LIPID PANEL
Cholesterol: 180 mg/dL (ref 0–200)
HDL: 67 mg/dL (ref >40)
LDL Cholesterol: 101 mg/dL — ABNORMAL HIGH (ref 0–99)
Total CHOL/HDL Ratio: 2.7 RATIO
Triglycerides: 62 mg/dL (ref <150)
VLDL: 12 mg/dL (ref 0–40)

## 2022-06-08 LAB — URINALYSIS, ROUTINE W REFLEX MICROSCOPIC
Bilirubin Urine: NEGATIVE
Glucose, UA: NEGATIVE mg/dL
Hgb urine dipstick: NEGATIVE
Ketones, ur: NEGATIVE mg/dL
Leukocytes,Ua: NEGATIVE
Nitrite: NEGATIVE
Protein, ur: NEGATIVE mg/dL
Specific Gravity, Urine: <=1.005 (ref 1.005–1.030)
pH: 5.5 (ref 5.0–8.0)

## 2022-06-08 LAB — HEMOGLOBIN A1C
Hgb A1c MFr Bld: 6.1 % — ABNORMAL HIGH (ref 4.8–5.6)
Mean Plasma Glucose: 128.37 mg/dL

## 2022-06-08 LAB — TROPONIN I (HIGH SENSITIVITY)
Troponin I (High Sensitivity): 3 ng/L (ref <18)
Troponin I (High Sensitivity): 3 ng/L (ref <18)

## 2022-06-08 MED ORDER — ENOXAPARIN SODIUM 60 MG/0.6ML IJ SOSY
50.0000 mg | PREFILLED_SYRINGE | INTRAMUSCULAR | Status: DC
  Administered 2022-06-08: 50 mg via SUBCUTANEOUS
  Filled 2022-06-08: qty 0.6

## 2022-06-08 MED ORDER — ASPIRIN 81 MG PO TBEC
81.0000 mg | DELAYED_RELEASE_TABLET | Freq: Every day | ORAL | Status: DC
  Administered 2022-06-08 – 2022-06-09 (×2): 81 mg via ORAL
  Filled 2022-06-08 (×2): qty 1

## 2022-06-08 MED ORDER — NORTRIPTYLINE HCL 25 MG PO CAPS
50.0000 mg | ORAL_CAPSULE | Freq: Every day | ORAL | Status: DC
  Administered 2022-06-08: 50 mg via ORAL
  Filled 2022-06-08: qty 2

## 2022-06-08 MED ORDER — POTASSIUM CHLORIDE CRYS ER 20 MEQ PO TBCR
20.0000 meq | EXTENDED_RELEASE_TABLET | Freq: Once | ORAL | Status: AC
  Administered 2022-06-08: 20 meq via ORAL
  Filled 2022-06-08: qty 1

## 2022-06-08 MED ORDER — FLUTICASONE PROPIONATE 50 MCG/ACT NA SUSP: 2.0000 | Freq: Every day | NASAL | Status: DC | PRN

## 2022-06-08 MED ORDER — IOHEXOL 350 MG/ML SOLN
75.0000 mL | Freq: Once | INTRAVENOUS | Status: AC | PRN
  Administered 2022-06-08: 75 mL via INTRAVENOUS

## 2022-06-08 MED ORDER — POLYETHYLENE GLYCOL 3350 17 G PO PACK: 17.0000 g | PACK | Freq: Every day | ORAL | Status: DC | PRN

## 2022-06-08 MED ORDER — METOCLOPRAMIDE HCL 5 MG/ML IJ SOLN
10.0000 mg | Freq: Once | INTRAMUSCULAR | Status: AC
  Administered 2022-06-08: 10 mg via INTRAVENOUS
  Filled 2022-06-08: qty 2

## 2022-06-08 MED ORDER — SODIUM CHLORIDE 0.9 % IV SOLN: INTRAVENOUS | Status: DC

## 2022-06-08 MED ORDER — SENNOSIDES-DOCUSATE SODIUM 8.6-50 MG PO TABS: 1.0000 | ORAL_TABLET | Freq: Every evening | ORAL | Status: DC | PRN

## 2022-06-08 MED ORDER — MAGNESIUM SULFATE 2 GM/50ML IV SOLN
2.0000 g | Freq: Once | INTRAVENOUS | Status: AC
  Administered 2022-06-08: 2 g via INTRAVENOUS
  Filled 2022-06-08: qty 50

## 2022-06-08 MED ORDER — ACETAMINOPHEN 325 MG PO TABS
650.0000 mg | ORAL_TABLET | ORAL | Status: DC | PRN
  Administered 2022-06-08 – 2022-06-09 (×3): 650 mg via ORAL
  Filled 2022-06-08 (×3): qty 2

## 2022-06-08 MED ORDER — STROKE: EARLY STAGES OF RECOVERY BOOK
Freq: Once | Status: AC
  Filled 2022-06-08: qty 1

## 2022-06-08 MED ORDER — ACETAMINOPHEN 650 MG RE SUPP: 650.0000 mg | RECTAL | Status: DC | PRN

## 2022-06-08 MED ORDER — ACETAMINOPHEN 160 MG/5ML PO SOLN: 650.0000 mg | ORAL | Status: DC | PRN

## 2022-06-08 MED ORDER — DIPHENHYDRAMINE HCL 50 MG/ML IJ SOLN
12.5000 mg | Freq: Once | INTRAMUSCULAR | Status: AC
  Administered 2022-06-08: 12.5 mg via INTRAVENOUS
  Filled 2022-06-08: qty 1

## 2022-06-08 MED ORDER — ROSUVASTATIN CALCIUM 5 MG PO TABS
10.0000 mg | ORAL_TABLET | Freq: Every day | ORAL | Status: DC
  Administered 2022-06-08: 10 mg via ORAL
  Filled 2022-06-08: qty 2

## 2022-06-08 MED ORDER — POLYVINYL ALCOHOL 1.4 % OP SOLN: 1.0000 [drp] | Freq: Three times a day (TID) | OPHTHALMIC | Status: DC | PRN

## 2022-06-08 NOTE — Progress Notes (Signed)
Plan of Care Note for accepted transfer   Patient: Stephanie Parks MRN: [id number]   DOA: 06/07/2022  Facility requesting transfer: [facility] ED Requesting Provider: Dr. Nicanor Alcon, EDP Reason for transfer: TIA workup  Facility course: The patient is a 77 year old female with past medical history significant for essential hypertension, hyperlipidemia, obesity, OSA on CPAP, brain aneurysm, migraines, GERD, heart palpitations recently on Zio patch by her cardiologist, with no recent changes in her medications, who presented to [facility] ED with complaints of headaches and visual changes.  Associated with left upper extremity weakness numbness and dizziness.  Symptoms onset around 7 PM, lasted 15 to 20 minutes.  In the ED, visual changes and left upper extremity weakness resolved.  The patient continued to have headache and dizziness.  CTA head and neck was negative for LVO.  ABCD2 score greater than 4.  EDP discussed the case with neurology who recommended admission for further TIA workup.  The patient received headache cocktail, Toradol was omitted due to history of brain aneurysm.  The patient was admitted at [facility] telemetry medical unit as observation status.  Please contact neurology once the patient arrives to the hospital.   Plan of care: The patient is accepted for admission to Telemetry unit, at [facility].  Author: Darlin Drop, DO 06/08/2022  Check [email or web address] for on-call coverage.  Nursing staff, Please call [facility] Admits & Consults System-Wide number on Amion as soon as patient's arrival, so appropriate admitting provider can evaluate the pt.

## 2022-06-08 NOTE — ED Notes (Signed)
ED MD informed of chest pain

## 2022-06-08 NOTE — Plan of Care (Signed)
Called by Dr. Daun Peacock at [facility]  77 year old woman with 15-30 minutes of severe headache, left arm weakness along with left-sided visual field deficits as well as left-sided sensory symptoms that have now nearly completely resolved.  Headache also is nearly resolved. ABCD2 score 5. We will be happy to see in consultation for a TIA workup at [facility]. Please call neurology when the patient arrives.   -- Milon Dikes, MD Neurologist [facility] Pager: [phone]

## 2022-06-08 NOTE — ED Notes (Signed)
Patient transported to CT 

## 2022-06-08 NOTE — H&P (Signed)
History and Physical    Patient: Stephanie Parks:[id number] DOB: 03-02-1945 DOA: 06/07/2022 DOS: the patient was seen and examined on 06/08/2022 PCP: Parks, Stephanie Found, MD  Patient coming from: Transfer from [facility]  Chief Complaint:  Chief Complaint  Patient presents with   stoke like symptoms   HPI: Stephanie Parks is a 77 y.o. female with medical history significant of hypertension, hyperlipidemia, prediabetes, history of PE in the setting of COVID-19, brain aneurysm, iron deficiency anemia, achalasia, GERD and OSA on CPAP who presented with complaints bitemporal headache, blurry vision, slurred speech, and left-sided sensations running down her arm that started yesterday afternoon around 7 PM.  Most of the symptoms resolved within her, but still reports having bitemporal headache that has persisted.  She reported associated symptoms of weakness and makes note she had been feeling like her heart has been racing.  She had been seen by her cardiologist Dr.  Eldridge Dace and placed on the heart monitor 2 days ago.   In the emergency department patient was noted to be afebrile with tachycardia, blood pressures initially elevated up to 155/81, and all other vital signs maintained.  Labs from 4/significant for hemoglobin 11, potassium 3.4, calcium 8.8, and high-sensitivity troponins negative x 2.  CT angiogram of the head and neck did not note any emergent large vessel occlusion.  Patient was not thought to be a tPA candidate due to symptoms.  Urinalysis showed no signs of infection.  Patient has been given 2 g of magnesium sulfate, calcium chloride p.o., Reglan, and Benadryl.  Neurology had been notified and recommended admission for completion of stroke workup.  Review of Systems: As mentioned in the history of present illness. All other systems reviewed and are negative. Past Medical History:  Diagnosis Date   Achalasia 09/28/2012   Allergic rhinitis 03/01/2015   Anemia    Aneurysm  03/01/2015   stable 5 mm periopthalmic right ICA aneurysm 03/25/21 CTA   BP (high blood pressure) 03/01/2015   Bronchitis 04/23/2018   Cephalalgia 08/24/2012   Overview:  ICD-10 cut over     Chest pain 04/04/2011   Coronary Ca Score 0, no significant CAD 08/20/18; CP with troponin 4>32 s/p LHC showing normal coronaries 12/29/18   CN (constipation) 09/28/2012   Coughing 04/24/2017   Decreased potassium in the blood 03/01/2015   Diverticulosis    Dizziness 03/01/2015   Erythropoietin deficiency anemia 03/13/2021   GERD (gastroesophageal reflux disease)    Hiatal hernia    Hypercholesterolemia 03/01/2015   Hyperlipidemia    Hypertension    Ingrown toenail 08/10/2017   Inguinal hernia    Iron deficiency anemia 03/13/2021   Migraine headache    Onychomycosis 12/08/2017   Paresthesia of arm 03/01/2015   PE (pulmonary thromboembolism) 03/13/2020   in setting of + COVID-19, s/p Eliquis x 3 month   Schatzki's ring    Tendonitis, Achilles, right 12/08/2017   Unilateral primary osteoarthritis, left knee 10/07/2017   Past Surgical History:  Procedure Laterality Date   ABDOMINAL HYSTERECTOMY     BACK SURGERY     x 3   CARDIAC CATHETERIZATION  12/29/2018   angiographically normal coronary arteries 12/29/18 ([place])   CARPAL TUNNEL RELEASE     left wrist   CERVICAL SPINE SURGERY     HEMORRHOID SURGERY     TOTAL HIP ARTHROPLASTY Left 09/04/2021   Procedure: LEFT TOTAL HIP ARTHROPLASTY ANTERIOR APPROACH;  Surgeon: Kathryne Hitch, MD;  Location: [facility] OR;  Service: Orthopedics;  Laterality:  Left;   Social History:  reports that she quit smoking about 38 years ago. Her smoking use included cigarettes. She has a 10.00 pack-year smoking history. She has never used smokeless tobacco. She reports that she does not drink alcohol and does not use drugs.  Allergies  Allergen Reactions   Shellfish Allergy Hives and Swelling   Ace Inhibitors Other (See Comments)    Pt cannot recall her  reaction but tolerates arb    Gabapentin Other (See Comments)    headaches   Pitavastatin     Unknown reaction  Other Reaction(s): Other (See Comments)  ADVERSE REACTION,  Pt unaware of this reaction; states she has been on crestor in the past with no issues   Sulfa Antibiotics Other (See Comments) and Rash    Fine bumps   Topiramate Palpitations    Heart race,     Family History  Problem Relation Age of Onset   Allergic rhinitis Sister    Diabetes Sister    Hypertension Sister    Heart attack Father 73   Heart disease Father    Stomach cancer Maternal Grandmother    Heart disease Mother    Colon cancer Neg Hx    Angioedema Neg Hx    Asthma Neg Hx    Eczema Neg Hx    Urticaria Neg Hx    Immunodeficiency Neg Hx    Breast cancer Neg Hx    Migraines Neg Hx    Headache Neg Hx     Prior to Admission medications   Medication Sig Start Date End Date Taking? Authorizing Provider  Azelastine HCl 0.15 % SOLN daily as needed. 12/31/18   [provider]  Calcium Carb-Cholecalciferol (CALCIUM 600 + D PO) Take 1 tablet by mouth daily.    [provider]  carboxymethylcellulose (REFRESH PLUS) 0.5 % SOLN Place 1 drop into both eyes 3 (three) times daily as needed (dry eyes).    [provider]  ELDERBERRY PO Take 1 capsule by mouth daily.    [provider]  EPINEPHrine (EPIPEN 2-PAK) 0.3 mg/0.3 mL IJ SOAJ injection Inject 0.3 mg into the muscle as needed for anaphylaxis. Per allergen immunotherapy protocol 09/27/21   Parks, Stephanie Found, MD  fluticasone (FLONASE) 50 MCG/ACT nasal spray Place 2 sprays into both nostrils daily as needed for allergies. 04/11/21   Parks, Stephanie Found, MD  HYDROcodone-acetaminophen (NORCO/VICODIN) 5-325 MG tablet Take 1 tablet by mouth every 4 (four) hours as needed. Patient not taking: Reported on 06/04/2022 04/08/22   Kirtland Bouchard, PA-C  KLOR-CON M20 20 MEQ tablet TAKE 2 TABLETS BY MOUTH EVERY DAY 01/12/22   Parks,  Stephanie Found, MD  losartan (COZAAR) 100 MG tablet Take 0.5 tablets (50 mg total) by mouth daily. 03/28/22   Corky Crafts, MD  MAGNESIUM PO Take 1 tablet by mouth daily. Patient not taking: Reported on 06/04/2022    [provider]  meloxicam (MOBIC) 15 MG tablet Take 1 tablet (15 mg total) by mouth daily. Patient not taking: Reported on 06/04/2022 04/03/22   Jacalyn Lefevre, MD  methocarbamol (ROBAXIN) 500 MG tablet Take 1 tablet (500 mg total) by mouth 3 (three) times daily. Patient not taking: Reported on 06/04/2022 04/11/22   Kirtland Bouchard, PA-C  Multiple Vitamins-Minerals (ALIVE MULTI-VITAMIN PO) Take 1 tablet by mouth daily.    [provider]  NON FORMULARY Pt uses a cpap nightly    [provider]  nortriptyline (PAMELOR) 25 MG capsule Take 2 capsules (  50 mg total) by mouth at bedtime. 03/21/22   Lomax, Amy, NP  omeprazole (PRILOSEC) 40 MG capsule Take 40 mg by mouth daily. 10/05/20   [provider]  polyethylene glycol (MIRALAX) 17 g packet Take 17 g by mouth daily. 02/06/22   Elayne Snare K, DO  rosuvastatin (CRESTOR) 10 MG tablet TAKE 1 TABLET BY MOUTH EVERY DAY 01/12/22   Parks, Stephanie Found, MD  senna-docusate (SENOKOT-S) 8.6-50 MG tablet Take 1 tablet by mouth daily. Patient not taking: Reported on 06/04/2022 02/06/22   Elayne Snare K, DO  spironolactone (ALDACTONE) 25 MG tablet Take 12.5 mg by mouth daily. Pt takes 1/2 tablet 12.5 mg daily.    [provider]  traMADol (ULTRAM) 50 MG tablet Take 2 tablets (100 mg total) by mouth every 12 (twelve) hours as needed. 05/29/22   Kathryne Hitch, MD  zinc gluconate 50 MG tablet Take 50 mg by mouth daily. Patient not taking: Reported on 06/04/2022    [provider]    Physical Exam: Vitals:   06/08/22 1000 06/08/22 1029 06/08/22 1100 06/08/22 1307  BP: 137/74  128/82 (!) 155/86  Pulse: 92  81 93  Resp: (!) 9 18 10 14   Temp:  97.8 F (36.6 C)  98 F (36.7 C)   TempSrc:  Oral  Oral  SpO2: 100%  100%   Weight:    100.5 kg  Height:    5\' 4"  (1.626 m)     Constitutional: Elderly female currently in NAD, calm, comfortable Eyes: PERRL, lids and conjunctivae normal ENMT: Mucous membranes are moist. edentulous on the top and bottom teeth still present. Neck: normal, supple  Respiratory: clear to auscultation bilaterally, no wheezing, no crackles. Normal respiratory effort. No accessory muscle use.  Cardiovascular: Regular rate and rhythm, no murmurs / rubs / gallops.  No significant edema.   Abdomen: no tenderness, no masses palpated. No hepatosplenomegaly. Bowel sounds positive.  Musculoskeletal: no clubbing / cyanosis. No joint deformity upper and lower extremities. Good ROM, no contractures. Normal muscle tone.  Skin: no rashes, lesions, ulcers. No induration Neurologic: CN 2-12 grossly intact. Strength 5/5 in both upper extremities.  Right leg strength 5/5.  Left leg with drift present, but reported to be chronic following left hip replacement Psychiatric: Normal judgment and insight. Alert and oriented x 3. Normal mood.   Data Reviewed:  Reviewed patient's labs, imaging, and pertinent records as noted above in HPI  Assessment and Plan: Transient ischemic attack Patient presents with acute onset of bitemporal headache with blurry vision, slurred speech, and paresthesias of the left arm reports of weakness that lasted 30 minutes.  Initial CT angiogram of the head and neck did not note any large vessel occlusion. -Admit to a telemetry bed -TIA/stroke order set utilized -Neurochecks -Follow-up MRI of the brain -Check echocardiogram of the heart -PT/OT/speech to eval and treat -Aspirin -Appreciate neurology consultative services we will follow-up for any further recommendations  Heart palpitations Patient appears to be in a normal sinus rhythm at this time.  She had just recently been set up with a Holter monitor due to reports of  palpitations. -Check TSH -Follow-up telemetry overnight -Will need to follow-up with cardiology for results of Holter monitor or study  Essential hypertension Home blood pressure regimen includes losartan 25 mg daily and Aldactone 12.5 mg daily. -Resume blood pressure regimen once medically appropriate  Prediabetes Patient not on any medications for treatment. -Follow-up hemoglobin A1c  Diastolic dysfunction Patient appears to be euvolemic on  physical exam.  Last EF was noted to be 60 to 65% with grade 1 diastolic dysfunction back in 03/2021.   -Follow-up echocardiogram  Hyperlipidemia -Follow-up lipid panel -Goal LDL less than 70 -Continue rosuvastatin.  Adjust dose if medically warranted  Hypokalemia Acute.  Initial potassium noted to be 3.4.  Patient has been given 20 meq of potassium chloride p.o. -Continue to monitor  History of brain aneurysm Patient noted to have a stable 5 mm periophthalmic right ICA aneurysm unchanged in size on prior CT angiogram of the head and neck from 03/2021.  OSA -Continue CPAP nightly  Obesity BMI 38.03 kg/m   Advance Care Planning:   Code Status: Full Code    Consults: Neurology  Family Communication: Family updated at bedside  Severity of Illness: The appropriate patient status for this patient is OBSERVATION. Observation status is judged to be reasonable and necessary in order to provide the required intensity of service to ensure the patient's safety. The patient's presenting symptoms, physical exam findings, and initial radiographic and laboratory data in the context of their medical condition is felt to place them at decreased risk for further clinical deterioration. Furthermore, it is anticipated that the patient will be medically stable for discharge from the hospital within 2 midnights of admission.   Author: Clydie Braun, MD 06/08/2022 1:33 PM  For on call review [email or web address].

## 2022-06-08 NOTE — ED Provider Notes (Signed)
[facility] Provider Note   CSN: [id number] Arrival date & time: 06/07/22  2309     History  Chief Complaint  Patient presents with   stoke like symptoms    Stephanie Parks is a 77 y.o. female.  The history is provided by the patient.  Weakness Severity:  Moderate Onset quality:  Sudden Duration: 15-30. Timing:  Constant Progression:  Resolved Chronicity:  New Context: not recent infection and not urinary tract infection   Relieved by:  Nothing Worsened by:  Nothing Ineffective treatments:  None tried Associated symptoms: headaches and vision change   Associated symptoms: no chest pain, no difficulty walking, no dizziness, no fever, no shortness of breath, no syncope and no vomiting   Risk factors: no neurologic disease and no new medications   Patient with a h/o cephalgia and aneurysm presents with occipital headache and arm weakness and blurry vision that started at 7 pm.  Weakness and vision resolved in 15-30 minutes.  Headache persists 6/10.  No changes in speech.      Past Medical History:  Diagnosis Date   Achalasia 09/28/2012   Allergic rhinitis 03/01/2015   Anemia    Aneurysm 03/01/2015   stable 5 mm periopthalmic right ICA aneurysm 03/25/21 CTA   BP (high blood pressure) 03/01/2015   Bronchitis 04/23/2018   Cephalalgia 08/24/2012   Overview:  ICD-10 cut over     Chest pain 04/04/2011   Coronary Ca Score 0, no significant CAD 08/20/18; CP with troponin 4>32 s/p LHC showing normal coronaries 12/29/18   CN (constipation) 09/28/2012   Coughing 04/24/2017   Decreased potassium in the blood 03/01/2015   Diverticulosis    Dizziness 03/01/2015   Erythropoietin deficiency anemia 03/13/2021   GERD (gastroesophageal reflux disease)    Hiatal hernia    Hypercholesterolemia 03/01/2015   Hyperlipidemia    Hypertension    Ingrown toenail 08/10/2017   Inguinal hernia    Iron deficiency anemia 03/13/2021   Migraine  headache    Onychomycosis 12/08/2017   Paresthesia of arm 03/01/2015   PE (pulmonary thromboembolism) 03/13/2020   in setting of + COVID-19, s/p Eliquis x 3 month   Schatzki's ring    Tendonitis, Achilles, right 12/08/2017   Unilateral primary osteoarthritis, left knee 10/07/2017    Home Medications Prior to Admission medications   Medication Sig Start Date End Date Taking? Authorizing Provider  Azelastine HCl 0.15 % SOLN daily as needed. 12/31/18   [provider]  Calcium Carb-Cholecalciferol (CALCIUM 600 + D PO) Take 1 tablet by mouth daily.    [provider]  carboxymethylcellulose (REFRESH PLUS) 0.5 % SOLN Place 1 drop into both eyes 3 (three) times daily as needed (dry eyes).    [provider]  ELDERBERRY PO Take 1 capsule by mouth daily.    [provider]  EPINEPHrine (EPIPEN 2-PAK) 0.3 mg/0.3 mL IJ SOAJ injection Inject 0.3 mg into the muscle as needed for anaphylaxis. Per allergen immunotherapy protocol 09/27/21   Copland, Gwenlyn Found, MD  fluticasone (FLONASE) 50 MCG/ACT nasal spray Place 2 sprays into both nostrils daily as needed for allergies. 04/11/21   Copland, Gwenlyn Found, MD  HYDROcodone-acetaminophen (NORCO/VICODIN) 5-325 MG tablet Take 1 tablet by mouth every 4 (four) hours as needed. Patient not taking: Reported on 06/04/2022 04/08/22   Kirtland Bouchard, PA-C  KLOR-CON M20 20 MEQ tablet TAKE 2 TABLETS BY MOUTH EVERY DAY 01/12/22   Copland, Gwenlyn Found, MD  losartan (COZAAR)  100 MG tablet Take 0.5 tablets (50 mg total) by mouth daily. 03/28/22   Corky Crafts, MD  MAGNESIUM PO Take 1 tablet by mouth daily. Patient not taking: Reported on 06/04/2022    [provider]  meloxicam (MOBIC) 15 MG tablet Take 1 tablet (15 mg total) by mouth daily. Patient not taking: Reported on 06/04/2022 04/03/22   Jacalyn Lefevre, MD  methocarbamol (ROBAXIN) 500 MG tablet Take 1 tablet (500 mg total) by mouth 3 (three) times daily. Patient not  taking: Reported on 06/04/2022 04/11/22   Kirtland Bouchard, PA-C  Multiple Vitamins-Minerals (ALIVE MULTI-VITAMIN PO) Take 1 tablet by mouth daily.    [provider]  NON FORMULARY Pt uses a cpap nightly    [provider]  nortriptyline (PAMELOR) 25 MG capsule Take 2 capsules (50 mg total) by mouth at bedtime. 03/21/22   Lomax, Amy, NP  omeprazole (PRILOSEC) 40 MG capsule Take 40 mg by mouth daily. 10/05/20   [provider]  polyethylene glycol (MIRALAX) 17 g packet Take 17 g by mouth daily. 02/06/22   Elayne Snare K, DO  rosuvastatin (CRESTOR) 10 MG tablet TAKE 1 TABLET BY MOUTH EVERY DAY 01/12/22   Copland, Gwenlyn Found, MD  senna-docusate (SENOKOT-S) 8.6-50 MG tablet Take 1 tablet by mouth daily. Patient not taking: Reported on 06/04/2022 02/06/22   Elayne Snare K, DO  spironolactone (ALDACTONE) 25 MG tablet Take 12.5 mg by mouth daily. Pt takes 1/2 tablet 12.5 mg daily.    [provider]  traMADol (ULTRAM) 50 MG tablet Take 2 tablets (100 mg total) by mouth every 12 (twelve) hours as needed. 05/29/22   Kathryne Hitch, MD  zinc gluconate 50 MG tablet Take 50 mg by mouth daily. Patient not taking: Reported on 06/04/2022    [provider]      Allergies    Shellfish allergy, Ace inhibitors, Gabapentin, Pitavastatin, Sulfa antibiotics, and Topiramate    Review of Systems   Review of Systems  Constitutional:  Negative for fever.  HENT:  Negative for facial swelling and trouble swallowing.   Eyes:  Positive for visual disturbance.  Respiratory:  Negative for shortness of breath.   Cardiovascular:  Negative for chest pain and syncope.  Gastrointestinal:  Negative for vomiting.  Neurological:  Positive for weakness and headaches. Negative for dizziness, syncope, facial asymmetry and speech difficulty.  All other systems reviewed and are negative.   Physical Exam Updated Vital Signs BP 116/83   Pulse 90   Temp 98.2 F (36.8 C)  (Oral)   Resp 14   Ht  (1.626 m)   Wt 100.6 kg   SpO2 100%   BMI 38.07 kg/m  Physical Exam Vitals and nursing note reviewed. Exam conducted with a chaperone present.  Constitutional:      General: She is not in acute distress.    Appearance: Normal appearance. She is well-developed. She is not diaphoretic.  HENT:     Head: Normocephalic and atraumatic.     Nose: Nose normal.     Mouth/Throat:     Mouth: Mucous membranes are dry.     Pharynx: Oropharynx is clear.  Eyes:     Extraocular Movements: Extraocular movements intact.     Pupils: Pupils are equal, round, and reactive to light.  Cardiovascular:     Rate and Rhythm: Normal rate and regular rhythm.     Pulses: Normal pulses.     Heart sounds: Normal heart sounds.  Pulmonary:  Effort: Pulmonary effort is normal. No respiratory distress.     Breath sounds: Normal breath sounds.  Abdominal:     General: Bowel sounds are normal. There is no distension.     Palpations: Abdomen is soft.     Tenderness: There is no abdominal tenderness. There is no guarding or rebound.  Genitourinary:    Vagina: No vaginal discharge.  Musculoskeletal:        General: Normal range of motion.     Cervical back: Normal range of motion and neck supple.  Skin:    General: Skin is warm and dry.     Capillary Refill: Capillary refill takes less than 2 seconds.     Findings: No erythema or rash.  Neurological:     General: No focal deficit present.     Mental Status: She is alert and oriented to person, place, and time.     Cranial Nerves: No cranial nerve deficit.     Sensory: No sensory deficit.     Motor: No weakness.     Coordination: Coordination normal.     Gait: Gait normal.     Deep Tendon Reflexes: Reflexes normal.  Psychiatric:        Mood and Affect: Mood normal.     ED Results / Procedures / Treatments   Labs (all labs ordered are listed, but only abnormal results are displayed) Results for orders placed or performed  during the hospital encounter of 06/07/22  CBC with Differential  Result Value Ref Range   WBC 8.2 4.0 - 10.5 K/uL   RBC 3.76 (L) 3.87 - 5.11 MIL/uL   Hemoglobin 11.0 (L) 12.0 - 15.0 g/dL   HCT 16.1 (L) 09.6 - 04.5 %   MCV 90.2 80.0 - 100.0 fL   MCH 29.3 26.0 - 34.0 pg   MCHC 32.4 30.0 - 36.0 g/dL   RDW 40.9 81.1 - 91.4 %   Platelets 308 150 - 400 K/uL   nRBC 0.0 0.0 - 0.2 %   Neutrophils Relative % 56 %   Neutro Abs 4.6 1.7 - 7.7 K/uL   Lymphocytes Relative 35 %   Lymphs Abs 2.9 0.7 - 4.0 K/uL   Monocytes Relative 6 %   Monocytes Absolute 0.5 0.1 - 1.0 K/uL   Eosinophils Relative 3 %   Eosinophils Absolute 0.3 0.0 - 0.5 K/uL   Basophils Relative 0 %   Basophils Absolute 0.0 0.0 - 0.1 K/uL   Immature Granulocytes 0 %   Abs Immature Granulocytes 0.03 0.00 - 0.07 K/uL  Basic metabolic panel  Result Value Ref Range   Sodium 138 135 - 145 mmol/L   Potassium 3.4 (L) 3.5 - 5.1 mmol/L   Chloride 103 98 - 111 mmol/L   CO2 26 22 - 32 mmol/L   Glucose, Bld 108 (H) 70 - 99 mg/dL   BUN 11 8 - 23 mg/dL   Creatinine, Ser 7.82 0.44 - 1.00 mg/dL   Calcium 8.8 (L) 8.9 - 10.3 mg/dL   GFR, Estimated >95 >62 mL/min   Anion gap 9 5 - 15  Urinalysis, Routine w reflex microscopic -Urine, Clean Catch  Result Value Ref Range   Color, Urine YELLOW YELLOW   APPearance CLEAR CLEAR   Specific Gravity, Urine <=1.005 1.005 - 1.030   pH 5.5 5.0 - 8.0   Glucose, UA NEGATIVE NEGATIVE mg/dL   Hgb urine dipstick NEGATIVE NEGATIVE   Bilirubin Urine NEGATIVE NEGATIVE   Ketones, ur NEGATIVE NEGATIVE mg/dL   Protein, ur  NEGATIVE NEGATIVE mg/dL   Nitrite NEGATIVE NEGATIVE   Leukocytes,Ua NEGATIVE NEGATIVE  CBG monitoring, ED  Result Value Ref Range   Glucose-Capillary 99 70 - 99 mg/dL   *Note: Due to a large number of results and/or encounters for the requested time period, some results have not been displayed. A complete set of results can be found in Results Review.   CT ANGIO HEAD NECK W WO  CM  Result Date: 06/08/2022 CLINICAL DATA:  Syncope EXAM: CT ANGIOGRAPHY HEAD AND NECK WITH AND WITHOUT CONTRAST TECHNIQUE: Multidetector CT imaging of the head and neck was performed using the standard protocol during bolus administration of intravenous contrast. Multiplanar CT image reconstructions and MIPs were obtained to evaluate the vascular anatomy. Carotid stenosis measurements (when applicable) are obtained utilizing NASCET criteria, using the distal internal carotid diameter as the denominator. RADIATION DOSE REDUCTION: This exam was performed according to the departmental dose-optimization program which includes automated exposure control, adjustment of the mA and/or kV according to patient size and/or use of iterative reconstruction technique. CONTRAST:  75mL OMNIPAQUE IOHEXOL 350 MG/ML SOLN COMPARISON:  None Available. FINDINGS: CT HEAD FINDINGS Brain: There is no mass, hemorrhage or extra-axial collection. The size and configuration of the ventricles and extra-axial CSF spaces are normal. There is no acute or chronic infarction. The brain parenchyma is normal. Skull: The visualized skull base, calvarium and extracranial soft tissues are normal. Sinuses/Orbits: No fluid levels or advanced mucosal thickening of the visualized paranasal sinuses. No mastoid or middle ear effusion. The orbits are normal. CTA NECK FINDINGS SKELETON: There is no bony spinal canal stenosis. No lytic or blastic lesion. C4-6 ACDF. OTHER NECK: Normal pharynx, larynx and major salivary glands. No cervical lymphadenopathy. Unremarkable thyroid gland. UPPER CHEST: No pneumothorax or pleural effusion. No nodules or masses. AORTIC ARCH: There is calcific atherosclerosis of the aortic arch. There is no aneurysm, dissection or hemodynamically significant stenosis of the visualized portion of the aorta. Conventional 3 vessel aortic branching pattern. The visualized proximal subclavian arteries are widely patent. Tortuous proximal right  subclavian artery laterally displaces the internal jugular vein. RIGHT CAROTID SYSTEM: Normal without aneurysm, dissection or stenosis. LEFT CAROTID SYSTEM: Normal without aneurysm, dissection or stenosis. VERTEBRAL ARTERIES: Left dominant configuration. Both origins are clearly patent. There is no dissection, occlusion or flow-limiting stenosis to the skull base (V1-V3 segments). CTA HEAD FINDINGS POSTERIOR CIRCULATION: --Vertebral arteries: Normal V4 segments. --Inferior cerebellar arteries: Normal. --Basilar artery: Normal. --Superior cerebellar arteries: Normal. --Posterior cerebral arteries (PCA): Normal. ANTERIOR CIRCULATION: --Intracranial internal carotid arteries: Normal. --Anterior cerebral arteries (ACA): Normal. Both A1 segments are present. Patent anterior communicating artery (a-comm). --Middle cerebral arteries (MCA): Normal. VENOUS SINUSES: As permitted by contrast timing, patent. ANATOMIC VARIANTS: None Review of the MIP images confirms the above findings. IMPRESSION: 1. No emergent large vessel occlusion or hemodynamically significant stenosis of the head or neck. 2.  Aortic atherosclerosis (ICD10-I70.0). Electronically Signed   By: Deatra Robinson M.D.   On: 06/08/2022 00:57   DG Chest 2 View  Result Date: 05/22/2022 CLINICAL DATA:  Shortness of breath EXAM: CHEST - 2 VIEW COMPARISON:  02/06/2022 FINDINGS: Normal heart size and mediastinal contours. No acute infiltrate or edema. No effusion or pneumothorax. No acute osseous findings. IMPRESSION: No evidence of active disease. Electronically Signed   By: Tiburcio Pea M.D.   On: 05/22/2022 07:56    Radiology CT ANGIO HEAD NECK W WO CM  Result Date: 06/08/2022 CLINICAL DATA:  Syncope EXAM: CT ANGIOGRAPHY HEAD AND NECK WITH  AND WITHOUT CONTRAST TECHNIQUE: Multidetector CT imaging of the head and neck was performed using the standard protocol during bolus administration of intravenous contrast. Multiplanar CT image reconstructions and MIPs  were obtained to evaluate the vascular anatomy. Carotid stenosis measurements (when applicable) are obtained utilizing NASCET criteria, using the distal internal carotid diameter as the denominator. RADIATION DOSE REDUCTION: This exam was performed according to the departmental dose-optimization program which includes automated exposure control, adjustment of the mA and/or kV according to patient size and/or use of iterative reconstruction technique. CONTRAST:  75mL OMNIPAQUE IOHEXOL 350 MG/ML SOLN COMPARISON:  None Available. FINDINGS: CT HEAD FINDINGS Brain: There is no mass, hemorrhage or extra-axial collection. The size and configuration of the ventricles and extra-axial CSF spaces are normal. There is no acute or chronic infarction. The brain parenchyma is normal. Skull: The visualized skull base, calvarium and extracranial soft tissues are normal. Sinuses/Orbits: No fluid levels or advanced mucosal thickening of the visualized paranasal sinuses. No mastoid or middle ear effusion. The orbits are normal. CTA NECK FINDINGS SKELETON: There is no bony spinal canal stenosis. No lytic or blastic lesion. C4-6 ACDF. OTHER NECK: Normal pharynx, larynx and major salivary glands. No cervical lymphadenopathy. Unremarkable thyroid gland. UPPER CHEST: No pneumothorax or pleural effusion. No nodules or masses. AORTIC ARCH: There is calcific atherosclerosis of the aortic arch. There is no aneurysm, dissection or hemodynamically significant stenosis of the visualized portion of the aorta. Conventional 3 vessel aortic branching pattern. The visualized proximal subclavian arteries are widely patent. Tortuous proximal right subclavian artery laterally displaces the internal jugular vein. RIGHT CAROTID SYSTEM: Normal without aneurysm, dissection or stenosis. LEFT CAROTID SYSTEM: Normal without aneurysm, dissection or stenosis. VERTEBRAL ARTERIES: Left dominant configuration. Both origins are clearly patent. There is no dissection,  occlusion or flow-limiting stenosis to the skull base (V1-V3 segments). CTA HEAD FINDINGS POSTERIOR CIRCULATION: --Vertebral arteries: Normal V4 segments. --Inferior cerebellar arteries: Normal. --Basilar artery: Normal. --Superior cerebellar arteries: Normal. --Posterior cerebral arteries (PCA): Normal. ANTERIOR CIRCULATION: --Intracranial internal carotid arteries: Normal. --Anterior cerebral arteries (ACA): Normal. Both A1 segments are present. Patent anterior communicating artery (a-comm). --Middle cerebral arteries (MCA): Normal. VENOUS SINUSES: As permitted by contrast timing, patent. ANATOMIC VARIANTS: None Review of the MIP images confirms the above findings. IMPRESSION: 1. No emergent large vessel occlusion or hemodynamically significant stenosis of the head or neck. 2.  Aortic atherosclerosis (ICD10-I70.0). Electronically Signed   By: Deatra Robinson M.D.   On: 06/08/2022 00:57    Procedures Procedures    Medications Ordered in ED Medications  magnesium sulfate IVPB 2 g 50 mL (has no administration in time range)  metoCLOPramide (REGLAN) injection 10 mg (has no administration in time range)  diphenhydrAMINE (BENADRYL) injection 12.5 mg (has no administration in time range)  potassium chloride SA (KLOR-CON M) CR tablet 20 mEq (has no administration in time range)  iohexol (OMNIPAQUE) 350 MG/ML injection 75 mL (75 mLs Intravenous Contrast Given 06/08/22 0045)    ED Course/ Medical Decision Making/ A&P                             Medical Decision Making Patient with aneurysm and HA and weakness and visual changes   Amount and/or Complexity of Data Reviewed External Data Reviewed: radiology and notes.    Details: Previous notes and imaging reviewed  Labs: ordered.    Details: All labs reviewed: normal white count 8.2, normal platelets 308 hemoglobin 11 slightly low.  Normal  sodium 138, potassium 3.4, normal creatinine .96.  Urine is negative for UTI Radiology: ordered and independent  interpretation performed.    Details: CTA head and neck negative by me  Discussion of management or test interpretation with external provider(s): Case d/w Dr. Wilford Corner, based on ABCD2 score > 4 can admit for TIA work up  Case d/w Dr. Margo Aye who will admit the patient   Risk Prescription drug management. Decision regarding hospitalization. Risk Details: ABCD2 score is 5 for me will admit for TIA work up     Final Clinical Impression(s) / ED Diagnoses Final diagnoses:  TIA (transient ischemic attack)  Headache disorder   The patient appears reasonably stabilized for admission considering the current resources, flow, and capabilities available in the ED at this time, and I doubt any other [facility] requiring further screening and/or treatment in the ED prior to admission.  Rx / DC Orders ED Discharge Orders     None         Yahira Timberman, MD 06/08/22 1610

## 2022-06-08 NOTE — Consult Note (Addendum)
NEURO HOSPITALIST CONSULT NOTE   Requestig physician: Dr. Katrinka Blazing  Reason for Consult: TIA  History obtained from:  Patient and Chart     HPI:                                                                                                                                          Stephanie Parks is an 77 y.o. female female with a PMHx of HTN, hyperlipidemia, obesity, OSA on CPAP, brain aneurysm, migraines, GERD, heart palpitations recently on Zio patch by her cardiologist, who presented to the [facility] ED late Friday night after experiencing sudden onset neurological symptoms consisting of 15 seconds of severe headache, left arm weakness, left-sided visual field deficits, dizziness and left-sided sensory symptoms that spontaneously resolved, except for lingering dizziness (described as a preysncopal/lightheaded sensation) and 8/10 throbbing headache, worse on the left, which is still present. Symptoms had started at about 7 PM. ABCD2 score 5. In other notes the time of the neurological deficits is listed as between 15-30 minutes, which patient clarifies was not the case, the vision and left arm weakness symptoms having lasted, again, for only 15 seconds.   CTA of head and neck was negative for LVO. She received a headache cocktail in the ED.   Past Medical History:  Diagnosis Date   Achalasia 09/28/2012   Allergic rhinitis 03/01/2015   Anemia    Aneurysm 03/01/2015   stable 5 mm periopthalmic right ICA aneurysm 03/25/21 CTA   BP (high blood pressure) 03/01/2015   Bronchitis 04/23/2018   Cephalalgia 08/24/2012   Overview:  ICD-10 cut over     Chest pain 04/04/2011   Coronary Ca Score 0, no significant CAD 08/20/18; CP with troponin 4>32 s/p LHC showing normal coronaries 12/29/18   CN (constipation) 09/28/2012   Coughing 04/24/2017   Decreased potassium in the blood 03/01/2015   Diverticulosis    Dizziness 03/01/2015   Erythropoietin deficiency anemia 03/13/2021   GERD  (gastroesophageal reflux disease)    Hiatal hernia    Hypercholesterolemia 03/01/2015   Hyperlipidemia    Hypertension    Ingrown toenail 08/10/2017   Inguinal hernia    Iron deficiency anemia 03/13/2021   Migraine headache    Onychomycosis 12/08/2017   Paresthesia of arm 03/01/2015   PE (pulmonary thromboembolism) 03/13/2020   in setting of + COVID-19, s/p Eliquis x 3 month   Schatzki's ring    Tendonitis, Achilles, right 12/08/2017   Unilateral primary osteoarthritis, left knee 10/07/2017    Past Surgical History:  Procedure Laterality Date   ABDOMINAL HYSTERECTOMY     BACK SURGERY     x 3   CARDIAC CATHETERIZATION  12/29/2018   angiographically normal coronary arteries 12/29/18 ([place])   CARPAL TUNNEL RELEASE     left  wrist   CERVICAL SPINE SURGERY     HEMORRHOID SURGERY     TOTAL HIP ARTHROPLASTY Left 09/04/2021   Procedure: LEFT TOTAL HIP ARTHROPLASTY ANTERIOR APPROACH;  Surgeon: Kathryne Hitch, MD;  Location: [facility] OR;  Service: Orthopedics;  Laterality: Left;    Family History  Problem Relation Age of Onset   Allergic rhinitis Sister    Diabetes Sister    Hypertension Sister    Heart attack Father 59   Heart disease Father    Stomach cancer Maternal Grandmother    Heart disease Mother    Colon cancer Neg Hx    Angioedema Neg Hx    Asthma Neg Hx    Eczema Neg Hx    Urticaria Neg Hx    Immunodeficiency Neg Hx    Breast cancer Neg Hx    Migraines Neg Hx    Headache Neg Hx             Social History:  reports that she quit smoking about 38 years ago. Her smoking use included cigarettes. She has a 10.00 pack-year smoking history. She has never used smokeless tobacco. She reports that she does not drink alcohol and does not use drugs.  Allergies  Allergen Reactions   Shellfish Allergy Hives and Swelling   Ace Inhibitors Other (See Comments)    Pt cannot recall her reaction but tolerates arb    Gabapentin Other (See Comments)    headaches    Pitavastatin     Unknown reaction  Other Reaction(s): Other (See Comments)  ADVERSE REACTION,  Pt unaware of this reaction; states she has been on crestor in the past with no issues   Sulfa Antibiotics Other (See Comments) and Rash    Fine bumps   Topiramate Palpitations    Heart race,     MEDICATIONS:                                                                                                                     Prior to Admission:  Medications Prior to Admission  Medication Sig Dispense Refill Last Dose   Calcium Carb-Cholecalciferol (CALCIUM 600 + D PO) Take 1 tablet by mouth daily.   Past Week   carboxymethylcellulose (REFRESH PLUS) 0.5 % SOLN Place 1 drop into both eyes 3 (three) times daily as needed (dry eyes).   Unk   Cyanocobalamin (B-12 PO) Take 1 tablet by mouth daily.   Past Week   ELDERBERRY PO Take 1 capsule by mouth daily.   Past Week   EPINEPHrine (EPIPEN 2-PAK) 0.3 mg/0.3 mL IJ SOAJ injection Inject 0.3 mg into the muscle as needed for anaphylaxis. Per allergen immunotherapy protocol 1 each 2 Unk   fluticasone (FLONASE) 50 MCG/ACT nasal spray Place 2 sprays into both nostrils daily as needed for allergies. 48 g 3    KLOR-CON M20 20 MEQ tablet TAKE 2 TABLETS BY MOUTH EVERY DAY 180 tablet 1 Past Week   losartan (COZAAR) 100 MG tablet Take 0.5 tablets (50  mg total) by mouth daily. 45 tablet 3 Past Week   Multiple Vitamins-Minerals (ALIVE MULTI-VITAMIN PO) Take 1 tablet by mouth daily.   Past Week   nortriptyline (PAMELOR) 25 MG capsule Take 2 capsules (50 mg total) by mouth at bedtime. 180 capsule 3 Past Week   polyethylene glycol (MIRALAX) 17 g packet Take 17 g by mouth daily. (Patient taking differently: Take 17 g by mouth daily as needed for mild constipation or moderate constipation.) 14 each 0    rosuvastatin (CRESTOR) 10 MG tablet TAKE 1 TABLET BY MOUTH EVERY DAY (Patient taking differently: Take 10 mg by mouth daily.) 90 tablet 3 Past Week   spironolactone  (ALDACTONE) 25 MG tablet Take 12.5 mg by mouth daily.   Past Week   NON FORMULARY Pt uses a cpap nightly      Scheduled:  [START ON 06/09/2022]  stroke: early stages of recovery book   Does not apply Once   aspirin EC  81 mg Oral Daily   enoxaparin (LOVENOX) injection  50 mg Subcutaneous Q24H   nortriptyline  50 mg Oral QHS   rosuvastatin  10 mg Oral Daily   Continuous:  sodium chloride 75 mL/hr at 06/08/22 1546     ROS:                                                                                                                                       As per HPI. She does not endorse any additional symptoms.    Blood pressure (!) 152/82, pulse 92, temperature 98.9 F (37.2 C), temperature source Oral, resp. rate 19, height  (1.626 m), weight 100.5 kg, SpO2 99 %.   General Examination:                                                                                                       Physical Exam  HEENT-  [place]/AT   Lungs- Respirations unlabored Extremities- No edema   Neurological Examination Mental Status: Awake, alert and oriented. Speech fluent with intact naming and comprehension.  Cranial Nerves: II: Visual fields intact bilaterally. No extinction to DSS.   III,IV, VI: No ptosis. EOMI. No nystagmus.  V: Temp sensation subjectively decreased on the left VII: Smile symmetric VIII: Hearing intact to conversation IX,X: No hoarseness or hypophonia XI: Symmetric XII: Midline tongue extension Motor: Right : Upper extremity   5/5    Left:     Upper extremity   5/5  Lower extremity  5/5     Lower extremity   5/5 No pronator drift Sensory: Temp and light touch subjectively decreased to LUE and LLE. No extinction to DSS.  Deep Tendon Reflexes: 2+ and symmetric throughout Cerebellar: No ataxia with FNF bilaterally Gait: Deferred    Lab Results: Basic Metabolic Panel: Recent Labs  Lab 06/07/22 2354  NA 138  K 3.4*  CL 103  CO2 26  GLUCOSE 108*  BUN 11   CREATININE 0.96  CALCIUM 8.8*    CBC: Recent Labs  Lab 06/07/22 2354  WBC 8.2  NEUTROABS 4.6  HGB 11.0*  HCT 33.9*  MCV 90.2  PLT 308    Cardiac Enzymes: No results for input(s): "CKTOTAL", "CKMB", "CKMBINDEX", "TROPONINI" in the last 168 hours.  Lipid Panel: Recent Labs  Lab 06/08/22 1525  CHOL 180  TRIG 62  HDL 67  CHOLHDL 2.7  VLDL 12  LDLCALC 161*    Imaging: MR BRAIN WO CONTRAST  Result Date: 06/08/2022 CLINICAL DATA:  Stroke, follow-up.  Syncope. EXAM: MRI HEAD WITHOUT CONTRAST TECHNIQUE: Multiplanar, multiecho pulse sequences of the brain and surrounding structures were obtained without intravenous contrast. COMPARISON:  CTA head and neck 06/08/2022. MR head without contrast 09/28/2018 FINDINGS: Brain: No acute infarct, hemorrhage, or mass lesion is present. Scattered subcortical T2 hyperintensities demonstrates some progression since the prior study. Deep brain nuclei are within normal limits. The ventricles are of normal size. No significant extraaxial fluid collection is present. The brainstem and cerebellum are within normal limits. The internal auditory canals are within normal limits. Midline structures are within normal limits. Vascular: Flow is present in the major intracranial arteries. Skull and upper cervical spine: Degenerative changes are present at C2-3 and C3-4. Craniocervical junction is normal. Marrow signal is normal. Sinuses/Orbits: The paranasal sinuses and mastoid air cells are clear. The globes and orbits are within normal limits. IMPRESSION: 1. No acute intracranial abnormality or significant interval change. 2. Progressive scattered subcortical T2 hyperintensities bilaterally. The finding is nonspecific but can be seen in the setting of chronic microvascular ischemia, a demyelinating process such as multiple sclerosis, vasculitis, complicated migraine headaches, or as the sequelae of a prior infectious or inflammatory process. Electronically Signed    By: Marin Roberts M.D.   On: 06/08/2022 18:32   CT ANGIO HEAD NECK W WO CM  Result Date: 06/08/2022 CLINICAL DATA:  Syncope EXAM: CT ANGIOGRAPHY HEAD AND NECK WITH AND WITHOUT CONTRAST TECHNIQUE: Multidetector CT imaging of the head and neck was performed using the standard protocol during bolus administration of intravenous contrast. Multiplanar CT image reconstructions and MIPs were obtained to evaluate the vascular anatomy. Carotid stenosis measurements (when applicable) are obtained utilizing NASCET criteria, using the distal internal carotid diameter as the denominator. RADIATION DOSE REDUCTION: This exam was performed according to the departmental dose-optimization program which includes automated exposure control, adjustment of the mA and/or kV according to patient size and/or use of iterative reconstruction technique. CONTRAST:  75mL OMNIPAQUE IOHEXOL 350 MG/ML SOLN COMPARISON:  None Available. FINDINGS: CT HEAD FINDINGS Brain: There is no mass, hemorrhage or extra-axial collection. The size and configuration of the ventricles and extra-axial CSF spaces are normal. There is no acute or chronic infarction. The brain parenchyma is normal. Skull: The visualized skull base, calvarium and extracranial soft tissues are normal. Sinuses/Orbits: No fluid levels or advanced mucosal thickening of the visualized paranasal sinuses. No mastoid or middle ear effusion. The orbits are normal. CTA NECK FINDINGS SKELETON: There is no bony spinal canal stenosis. No  lytic or blastic lesion. C4-6 ACDF. OTHER NECK: Normal pharynx, larynx and major salivary glands. No cervical lymphadenopathy. Unremarkable thyroid gland. UPPER CHEST: No pneumothorax or pleural effusion. No nodules or masses. AORTIC ARCH: There is calcific atherosclerosis of the aortic arch. There is no aneurysm, dissection or hemodynamically significant stenosis of the visualized portion of the aorta. Conventional 3 vessel aortic branching pattern. The  visualized proximal subclavian arteries are widely patent. Tortuous proximal right subclavian artery laterally displaces the internal jugular vein. RIGHT CAROTID SYSTEM: Normal without aneurysm, dissection or stenosis. LEFT CAROTID SYSTEM: Normal without aneurysm, dissection or stenosis. VERTEBRAL ARTERIES: Left dominant configuration. Both origins are clearly patent. There is no dissection, occlusion or flow-limiting stenosis to the skull base (V1-V3 segments). CTA HEAD FINDINGS POSTERIOR CIRCULATION: --Vertebral arteries: Normal V4 segments. --Inferior cerebellar arteries: Normal. --Basilar artery: Normal. --Superior cerebellar arteries: Normal. --Posterior cerebral arteries (PCA): Normal. ANTERIOR CIRCULATION: --Intracranial internal carotid arteries: Normal. --Anterior cerebral arteries (ACA): Normal. Both A1 segments are present. Patent anterior communicating artery (a-comm). --Middle cerebral arteries (MCA): Normal. VENOUS SINUSES: As permitted by contrast timing, patent. ANATOMIC VARIANTS: None Review of the MIP images confirms the above findings. IMPRESSION: 1. No emergent large vessel occlusion or hemodynamically significant stenosis of the head or neck. 2.  Aortic atherosclerosis (ICD10-I70.0). Electronically Signed   By: Deatra Robinson M.D.   On: 06/08/2022 00:57     Assessment: 77 year old female presenting with sudden onset neurological symptoms consisting of 15 seconds of severe headache, left arm weakness, left-sided visual field deficits, dizziness and left-sided sensory symptoms that spontaneously resolved, except for lingering dizziness (described as a preysncopal/lightheaded sensation) and 8/10 throbbing headache, worse on the left, which is still present.  - Exam reveals mild left sided sensory loss - CT head: Normal.  - CTA of head and neck: No emergent large vessel occlusion or hemodynamically significant stenosis of the head or neck. Aortic atherosclerosis. - MRI brain: No acute  intracranial abnormality or significant interval change. Progressive scattered subcortical T2 hyperintensities bilaterally. The finding is nonspecific but can be seen in the setting of chronic microvascular ischemia, a demyelinating process such as multiple sclerosis, vasculitis, complicated migraine headaches, or as the sequelae of a prior infectious or inflammatory process. - DDx: The short duration of the symptoms, lasting for only seconds, would be atypical for TIA. She had prodrome of sweating and headache. Suspect possible atypical migraine. Capsular warning syndrome is also possible.     Recommendations: 1. HgbA1c, fasting lipid panel 2. TTE 3. PT consult, OT consult, Speech consult 4. Cardiac telemetry 5. Continue rosuvastatin 6. Agree with starting ASA  7. Risk factor modification 8. Frequent neuro checks 10. NPO until passes stroke swallow screen 11. Modified permissive HTN protocol for 24 hours after onset of symptoms. Treat SBP if > 180.   12. Tylenol for headache.   Electronically signed: Dr. Caryl Pina  06/08/2022, 7:54 PM

## 2022-06-09 ENCOUNTER — Observation Stay (HOSPITAL_BASED_OUTPATIENT_CLINIC_OR_DEPARTMENT_OTHER): Payer: Medicare Other

## 2022-06-09 DIAGNOSIS — I10 Essential (primary) hypertension: Secondary | ICD-10-CM | POA: Diagnosis not present

## 2022-06-09 DIAGNOSIS — G459 Transient cerebral ischemic attack, unspecified: Secondary | ICD-10-CM | POA: Diagnosis not present

## 2022-06-09 LAB — BASIC METABOLIC PANEL
Anion gap: 9 (ref 5–15)
BUN: 13 mg/dL (ref 8–23)
CO2: 27 mmol/L (ref 22–32)
Calcium: 8.8 mg/dL — ABNORMAL LOW (ref 8.9–10.3)
Chloride: 103 mmol/L (ref 98–111)
Creatinine, Ser: 1.11 mg/dL — ABNORMAL HIGH (ref 0.44–1.00)
GFR, Estimated: 52 mL/min — ABNORMAL LOW (ref >60)
Glucose, Bld: 102 mg/dL — ABNORMAL HIGH (ref 70–99)
Potassium: 3.7 mmol/L (ref 3.5–5.1)
Sodium: 139 mmol/L (ref 135–145)

## 2022-06-09 LAB — CBC
HCT: 34.6 % — ABNORMAL LOW (ref 36.0–46.0)
Hemoglobin: 11 g/dL — ABNORMAL LOW (ref 12.0–15.0)
MCH: 29.2 pg (ref 26.0–34.0)
MCHC: 31.8 g/dL (ref 30.0–36.0)
MCV: 91.8 fL (ref 80.0–100.0)
Platelets: 269 10*3/uL (ref 150–400)
RBC: 3.77 MIL/uL — ABNORMAL LOW (ref 3.87–5.11)
RDW: 14.7 % (ref 11.5–15.5)
WBC: 6.9 10*3/uL (ref 4.0–10.5)
nRBC: 0 % (ref 0.0–0.2)

## 2022-06-09 LAB — TSH: TSH: 3.771 u[IU]/mL (ref 0.350–4.500)

## 2022-06-09 LAB — ECHOCARDIOGRAM COMPLETE
Height: 64 in
S' Lateral: 2.8 cm
Weight: 3545 oz

## 2022-06-09 LAB — MAGNESIUM: Magnesium: 1.9 mg/dL (ref 1.7–2.4)

## 2022-06-09 MED ORDER — ASPIRIN 81 MG PO TBEC: 81.0000 mg | DELAYED_RELEASE_TABLET | Freq: Every day | ORAL | 0 refills | Status: DC

## 2022-06-09 MED ORDER — ROSUVASTATIN CALCIUM 20 MG PO TABS
20.0000 mg | ORAL_TABLET | Freq: Every day | ORAL | Status: DC
  Administered 2022-06-09: 20 mg via ORAL
  Filled 2022-06-09: qty 1

## 2022-06-09 MED ORDER — LOSARTAN POTASSIUM 50 MG PO TABS
50.0000 mg | ORAL_TABLET | Freq: Every day | ORAL | Status: DC
  Administered 2022-06-09: 50 mg via ORAL
  Filled 2022-06-09: qty 1

## 2022-06-09 MED ORDER — ROSUVASTATIN CALCIUM 20 MG PO TABS: 20.0000 mg | ORAL_TABLET | Freq: Every day | ORAL | 0 refills | Status: DC

## 2022-06-09 NOTE — Plan of Care (Signed)
  Problem: Education: Goal: Knowledge of General Education information will improve Description: Including pain rating scale, medication(s)/side effects and non-pharmacologic comfort measures Outcome: Completed/Met   Problem: Health Behavior/Discharge Planning: Goal: Ability to manage health-related needs will improve Outcome: Completed/Met   Problem: Clinical Measurements: Goal: Ability to maintain clinical measurements within normal limits will improve Outcome: Completed/Met Goal: Will remain free from infection Outcome: Completed/Met Goal: Diagnostic test results will improve Outcome: Completed/Met Goal: Respiratory complications will improve Outcome: Completed/Met Goal: Cardiovascular complication will be avoided Outcome: Completed/Met   Problem: Activity: Goal: Risk for activity intolerance will decrease Outcome: Completed/Met   Problem: Nutrition: Goal: Adequate nutrition will be maintained Outcome: Completed/Met   Problem: Coping: Goal: Level of anxiety will decrease Outcome: Completed/Met   Problem: Elimination: Goal: Will not experience complications related to bowel motility Outcome: Completed/Met Goal: Will not experience complications related to urinary retention Outcome: Completed/Met   Problem: Pain Managment: Goal: General experience of comfort will improve Outcome: Completed/Met   Problem: Safety: Goal: Ability to remain free from injury will improve Outcome: Completed/Met   Problem: Skin Integrity: Goal: Risk for impaired skin integrity will decrease Outcome: Completed/Met   Problem: Education: Goal: Knowledge of disease or condition will improve Outcome: Completed/Met Goal: Knowledge of secondary prevention will improve (MUST DOCUMENT ALL) Outcome: Completed/Met Goal: Knowledge of patient specific risk factors will improve (Mark N/A or DELETE if not current risk factor) Outcome: Completed/Met   Problem: Ischemic Stroke/TIA Tissue  Perfusion: Goal: Complications of ischemic stroke/TIA will be minimized Outcome: Completed/Met   Problem: Coping: Goal: Will verbalize positive feelings about self Outcome: Completed/Met Goal: Will identify appropriate support needs Outcome: Completed/Met   Problem: Health Behavior/Discharge Planning: Goal: Ability to manage health-related needs will improve Outcome: Completed/Met Goal: Goals will be collaboratively established with patient/family Outcome: Completed/Met   Problem: Self-Care: Goal: Ability to participate in self-care as condition permits will improve Outcome: Completed/Met Goal: Verbalization of feelings and concerns over difficulty with self-care will improve Outcome: Completed/Met Goal: Ability to communicate needs accurately will improve Outcome: Completed/Met   Problem: Nutrition: Goal: Risk of aspiration will decrease Outcome: Completed/Met Goal: Dietary intake will improve Outcome: Completed/Met   

## 2022-06-09 NOTE — Progress Notes (Addendum)
STROKE TEAM PROGRESS NOTE   INTERVAL HISTORY No family at the bedside patient is sitting in the chair in no apparent distress. States yesterday she was sitting at the table and she suddenly became sweaty and felt like she had pressure in her head then developed some blurry vision in both eyes and was unable to get her words out she also felt lightheaded and weak.  She also states she felt rushing of blood down her arm, which lasted about 15 to 30 seconds and resolved.  She endorses still feeling lightheaded with some head pressure this morning. MRI brain was negative for acute process. Her above symptoms sound more like an atypical migraine rather than a TIA.  Recommend increasing her Crestor starting her on aspirin 81 mg daily  Vitals:   06/08/22 2045 06/09/22 0035 06/09/22 0620 06/09/22 1002  BP: 125/64 (!) 149/82 (!) 154/87 138/66  Pulse: 88 88 82 92  Resp: Temp: 98.7 F (37.1 C) 98.7 F (37.1 C) 98 F (36.7 C) 97.9 F (36.6 C)  TempSrc: Oral Oral Oral Oral  SpO2:    96%  Weight:      Height:       CBC:  Recent Labs  Lab 06/07/22 2354 06/09/22 0347  WBC 8.2 6.9  NEUTROABS 4.6  --   HGB 11.0* 11.0*  HCT 33.9* 34.6*  MCV 90.2 91.8  PLT 308 269   Basic Metabolic Panel:  Recent Labs  Lab 06/07/22 2354 06/09/22 0347  NA 138 139  K 3.4* 3.7  CL 103 103  CO2 26 27  GLUCOSE 108* 102*  BUN 11 13  CREATININE 0.96 1.11*  CALCIUM 8.8* 8.8*  MG  --  1.9   Lipid Panel:  Recent Labs  Lab 06/08/22 1525  CHOL 180  TRIG 62  HDL 67  CHOLHDL 2.7  VLDL 12  LDLCALC 981*   HgbA1c:  Recent Labs  Lab 06/08/22 1525  HGBA1C 6.1*   Urine Drug Screen: No results for input(s): "LABOPIA", "COCAINSCRNUR", "LABBENZ", "AMPHETMU", "THCU", "LABBARB" in the last 168 hours.  Alcohol Level No results for input(s): "ETH" in the last 168 hours.  IMAGING past 24 hours MR BRAIN WO CONTRAST  Result Date: 06/08/2022 CLINICAL DATA:  Stroke, follow-up.  Syncope. EXAM: MRI  HEAD WITHOUT CONTRAST TECHNIQUE: Multiplanar, multiecho pulse sequences of the brain and surrounding structures were obtained without intravenous contrast. COMPARISON:  CTA head and neck 06/08/2022. MR head without contrast 09/28/2018 FINDINGS: Brain: No acute infarct, hemorrhage, or mass lesion is present. Scattered subcortical T2 hyperintensities demonstrates some progression since the prior study. Deep brain nuclei are within normal limits. The ventricles are of normal size. No significant extraaxial fluid collection is present. The brainstem and cerebellum are within normal limits. The internal auditory canals are within normal limits. Midline structures are within normal limits. Vascular: Flow is present in the major intracranial arteries. Skull and upper cervical spine: Degenerative changes are present at C2-3 and C3-4. Craniocervical junction is normal. Marrow signal is normal. Sinuses/Orbits: The paranasal sinuses and mastoid air cells are clear. The globes and orbits are within normal limits. IMPRESSION: 1. No acute intracranial abnormality or significant interval change. 2. Progressive scattered subcortical T2 hyperintensities bilaterally. The finding is nonspecific but can be seen in the setting of chronic microvascular ischemia, a demyelinating process such as multiple sclerosis, vasculitis, complicated migraine headaches, or as the sequelae of a prior infectious or inflammatory process. Electronically Signed   By: Virl Son.D.  On: 06/08/2022 18:32    PHYSICAL EXAM  Temp:  [97.9 F (36.6 C)-99.5 F (37.5 C)] 99.5 F (37.5 C) (04/21 1220) Pulse Rate:  [82-95] 95 (04/21 1220) Resp:  [12-19] 19 (04/21 1220) BP: (125-155)/(64-87) 125/77 (04/21 1220) SpO2:  [96 %-99 %] 96 % (04/21 1220) Weight:  [100.5 kg] 100.5 kg (04/20 1307)  General -pleasant elderly African-American lady, in no apparent distress. Cardiovascular - Regular rhythm and rate.  Mental Status -  Level of arousal  and orientation to time, place, and person were intact. Language including expression, naming, repetition, comprehension was assessed and found intact. Attention span and concentration were normal. Recent and remote memory were intact. Fund of Knowledge was assessed and was intact.  Cranial Nerves II - XII - II - Visual field intact OU. III, IV, VI - Extraocular movements intact. V - Facial sensation intact bilaterally. VII - Facial movement intact bilaterally. VIII - Hearing & vestibular intact bilaterally. X - Palate elevates symmetrically. XI - Chin turning & shoulder shrug intact bilaterally. XII - Tongue protrusion intact.  Motor Strength - The patient's strength was normal in all extremities and pronator drift was absent.  Bulk was normal and fasciculations were absent.   Motor Tone - Muscle tone was assessed at the neck and appendages and was normal.  Sensory - Light touch, temperature/pinprick were assessed and were symmetrical.    Coordination - The patient had normal movements in the hands and feet with no ataxia or dysmetria.  Tremor was absent.  Gait and Station - deferred.  ASSESSMENT/PLAN Ms. KAIJAH ABTS is a 77 y.o. female with history of hypertension, hyperlipidemia, prediabetes, history of PE in the setting of COVID-19, brain aneurysm, iron deficiency anemia, achalasia, GERD and OSA on CPAP who presented with complaints bitemporal headache, blurry vision, slurred speech, and left-sided sensations running down her arm that started yesterday afternoon around 7 PM.  Most of the symptoms resolved within her, but still reports having bitemporal headache that has persisted.   Atypical migraine CT head No acute abnormality.  CTA head & neck no LVO MRI no acute process 2D Echo pending LDL 101 HgbA1c 6.1 VTE prophylaxis -Lovenox    Diet   Diet Heart Room service appropriate? Yes; Fluid consistency: Thin   No antithrombotics prior to admission, now on aspirin 81  mg.  Therapy recommendations: Pending Disposition: Pending  Hypertension Home meds: Losartan 100 mg, Stable Permissive hypertension (OK if < 220/120) but gradually normalize in 5-7 days Long-term BP goal normotensive  Hyperlipidemia Home meds: Crestor 10 mg, resumed in hospital-increased to 20 mg LDL 101, goal < 70 Continue statin at discharge  Other Stroke Risk Factors Advanced Age >/= 66  Obesity, Body mass index is 38.03 kg/m., BMI >/= 30 associated with increased stroke risk, recommend weight loss, diet and exercise as appropriate  Migraines  Neurology will sign off.  Please call with questions or concerns  Hospital day # 0  Gevena Mart DNP, ACNPC-AG  [facility]  STROKE MD NOTE :  I have personally obtained history,examined this patient, reviewed notes, independently viewed imaging studies, participated in medical decision making and plan of care.ROS completed by me personally and pertinent positives fully documented  I have made any additions or clarifications directly to the above note. Agree with note above.  She presented with transient lightheadedness and heaviness of the head and some paresthesias in the hand in the setting of a headache and brain MRI is negative for acute stroke.  Symptoms likely  represent atypical migraine rather than TIA.  Recommend continue ongoing stroke workup.  Aspirin alone for stroke prevention and aggressive risk factor modification.  Mobilize out of bed.  Check echo results.  Therapy consults.  Greater than 50% time during this 50-minute visit was spent in counseling and coordination of care about TIA-like presentation and discussion about evaluation and test results and answering questions.  Discussed with Dr. Thedore Mins.  Stroke team will sign off.  Kindly call for questions  Delia Heady, MD Medical Director [facility] Pager: [phone] 06/09/2022 3:39 PM   To contact Stroke Continuity provider, please refer to  [email or web address]. After hours, contact General Neurology

## 2022-06-09 NOTE — Evaluation (Signed)
Occupational Therapy Evaluation Patient Details Name: Stephanie Parks MRN: [id number] DOB: 03-15-1945 Today's Date: 06/09/2022   History of Present Illness Pt is a 77 y/o F presenting ot [facility] 4/19 with headache and visual changes and LUE weakness.M RI brain with no acute intracranial abnormality or significant interval change. Admitted for TIA. PMH includes HTN, HLD, obesity, OSA on CPAP, brain aneurysm, migraines, GERD, heart palpatations, recent zio patch.   Clinical Impression   Pt reports independence at baseline with ADLs and functional mobility, lives with family who can assist PRN. Pt reports LUE and visual symptoms have resolved, reports continued headache. Pt needing min guard-min A for ADLs, mod I for bed mobility,and supervision for transfers without AD. Pt presenting with impairments listed below, however has no acute OT needs at this time, will s/o. Please reconsult if there is a change in pt status.     Recommendations for follow up therapy are one component of a multi-disciplinary discharge planning process, led by the attending physician.  Recommendations may be updated based on patient status, additional functional criteria and insurance authorization.   Assistance Recommended at Discharge Set up Supervision/Assistance  Patient can return home with the following A little help with bathing/dressing/bathroom;A little help with walking and/or transfers;Direct supervision/assist for medications management;Direct supervision/assist for financial management;Assist for transportation;Help with stairs or ramp for entrance;Assistance with cooking/housework    Functional Status Assessment  Patient has had a recent decline in their functional status and demonstrates the ability to make significant improvements in function in a reasonable and predictable amount of time.  Equipment Recommendations  None recommended by OT    Recommendations for Other Services PT consult     Precautions  / Restrictions Precautions Precautions: None Restrictions Weight Bearing Restrictions: No      Mobility Bed Mobility Overal bed mobility: Modified Independent                  Transfers Overall transfer level: Needs assistance Equipment used: None Transfers: Sit to/from Stand Sit to Stand: Supervision                  Balance Overall balance assessment: Mild deficits observed, not formally tested                                         ADL either performed or assessed with clinical judgement   ADL Overall ADL's : Needs assistance/impaired Eating/Feeding: Set up;Sitting   Grooming: Set up;Standing;Oral care;Wash/dry face   Upper Body Bathing: Sitting;Min guard   Lower Body Bathing: Sitting/lateral leans;Min guard   Upper Body Dressing : Sitting;Minimal assistance Upper Body Dressing Details (indicate cue type and reason): for lines Lower Body Dressing: Minimal assistance;Sitting/lateral leans   Toilet Transfer: Ambulation;Supervision/safety   Toileting- Clothing Manipulation and Hygiene: Supervision/safety       Functional mobility during ADLs: Supervision/safety       Vision Baseline Vision/History: 1 Wears glasses Ability to See in Adequate Light: 0 Adequate Patient Visual Report: No change from baseline Vision Assessment?: Yes Eye Alignment: Within Functional Limits Ocular Range of Motion: Within Functional Limits Alignment/Gaze Preference: Within Defined Limits Tracking/Visual Pursuits: Able to track stimulus in all quads without difficulty Saccades: Other (comment) (difficulty following instructions to assess saccadic movement) Convergence: Within functional limits Visual Fields: No apparent deficits Additional Comments: pt reports visual symptoms have resolved     Perception Perception Perception Tested?: No  Praxis Praxis Praxis tested?: Within functional limits    Pertinent Vitals/Pain Pain Assessment Pain  Assessment: Faces Pain Score: 4  Faces Pain Scale: Hurts little more Pain Location: head (temples) Pain Descriptors / Indicators: Pressure Pain Intervention(s): Limited activity within patient's tolerance, Monitored during session     Hand Dominance Right   Extremity/Trunk Assessment Upper Extremity Assessment Upper Extremity Assessment: Generalized weakness (slowed coordination/fine motor skills but symmetric on both sides)   Lower Extremity Assessment Lower Extremity Assessment: Defer to PT evaluation   Cervical / Trunk Assessment Cervical / Trunk Assessment: Normal   Communication Communication Communication: No difficulties   Cognition Arousal/Alertness: Awake/alert Behavior During Therapy: Flat affect Overall Cognitive Status: Within Functional Limits for tasks assessed                                 General Comments: aware of why she is in the hospital, provides PLOF appropriately, demonstrates good problem solving and awareness throughout session     General Comments  VSS on RA    Exercises     Shoulder Instructions      Home Living Family/patient expects to be discharged to:: Private residence Living Arrangements: Children Available Help at Discharge: Available PRN/intermittently Type of Home: House Home Access: Stairs to enter Secretary/administrator of Steps: 5 Entrance Stairs-Rails: Right;Left Home Layout: One level         Firefighter: Standard     Home Equipment: Cane - single point;BSC/3in1          Prior Functioning/Environment Prior Level of Function : Independent/Modified Independent;Driving             Mobility Comments: uses cane for community mobility ADLs Comments: ind with ADLs        OT Problem List: Decreased strength;Decreased coordination      OT Treatment/Interventions:      OT Goals(Current goals can be found in the care plan section) Acute Rehab OT Goals Patient Stated Goal: none stated OT  Goal Formulation: With patient Time For Goal Achievement: 06/23/22 Potential to Achieve Goals: Good  OT Frequency:      Co-evaluation              AM-PAC OT "6 Clicks" Daily Activity     Outcome Measure Help from another person eating meals?: None Help from another person taking care of personal grooming?: None Help from another person toileting, which includes using toliet, bedpan, or urinal?: A Little Help from another person bathing (including washing, rinsing, drying)?: A Little Help from another person to put on and taking off regular upper body clothing?: A Little Help from another person to put on and taking off regular lower body clothing?: A Little 6 Click Score: 20   End of Session Nurse Communication: Mobility status  Activity Tolerance: Patient tolerated treatment well Patient left: in chair;with call bell/phone within reach  OT Visit Diagnosis: Muscle weakness (generalized) (M62.81)                Time: 1610-9604 OT Time Calculation (min): 41 min Charges:  OT General Charges $OT Visit: 1 Visit OT Evaluation $OT Eval Low Complexity: 1 Low OT Treatments $Self Care/Home Management : 23-37 mins  Carver Fila, OTD, OTR/L SecureChat Preferred Acute Rehab [phone]   Carver Fila Koonce 06/09/2022, 9:39 AM

## 2022-06-09 NOTE — Discharge Instructions (Signed)
Follow with Primary MD Copland, Gwenlyn Found, MD in 7 days   Get CBC, CMP, A1c  -  checked next visit with your primary MD    Activity: As tolerated with Full fall precautions use walker/cane & assistance as needed  Disposition Home    Diet: Heart Healthy - Low Carb  Special Instructions: If you have smoked or chewed Tobacco  in the last 2 yrs please stop smoking, stop any regular Alcohol  and or any Recreational drug use.  On your next visit with your primary care physician please Get Medicines reviewed and adjusted.  Please request your Prim.MD to go over all Hospital Tests and Procedure/Radiological results at the follow up, please get all Hospital records sent to your Prim MD by signing hospital release before you go home.  If you experience worsening of your admission symptoms, develop shortness of breath, life threatening emergency, suicidal or homicidal thoughts you must seek medical attention immediately by calling 911 or calling your MD immediately  if symptoms less severe.  You Must read complete instructions/literature along with all the possible adverse reactions/side effects for all the Medicines you take and that have been prescribed to you. Take any new Medicines after you have completely understood and accpet all the possible adverse reactions/side effects.

## 2022-06-09 NOTE — Discharge Summary (Signed)
[name]:[id number] DOB: 03-Mar-1945 DOA: 06/07/2022  PCP: Stephanie Cables, Parks  Admit date: 06/07/2022  Discharge date: 06/09/2022  Admitted From: Home   Disposition:  Home   Recommendations for Outpatient Follow-up:   Follow up with PCP in 1-2 weeks  PCP Please obtain BMP/CBC, 2 view CXR in 1week,  (see Discharge instructions)   PCP Please follow up on the following pending results:    Home Health: None   Equipment/Devices: None  Consultations: Neuro Discharge Condition: Stable    CODE STATUS: Full    Diet Recommendation: Heart Healthy - Low Carb    Chief Complaint  Patient presents with   stoke like symptoms     Brief history of present illness from the day of admission and additional interim summary     77 y.o. female with medical history significant of hypertension, hyperlipidemia, prediabetes, history of PE in the setting of COVID-19, brain aneurysm, iron deficiency anemia, achalasia, GERD and OSA on CPAP who presented with complaints bitemporal headache, blurry vision, slurred speech, and left-sided sensations running down her arm that started evening of hospital visit and lasting few minutes, she came to the ER where MRI ruled out a stroke she was seen by neurology and admitted for further workup.                                                                 Hospital Course   Complicated Migraine -    Patient presents with acute onset of bitemporal headache with blurry vision, slurred speech, and paresthesias of the left arm which lasted for few seconds, now only has mild headache, MRI rules out acute stroke, by neurology Case discussed with stroke team attending Stephanie Parks in detail on 06/09/2022, aspirin 81 mg, increased dose statin as LDL was above goal.  Thereafter follow-up with PCP  and neurology outpatient for complex migraine and secondary risk factors modification for TIA/CVA.  This episode most likely was atypical migraine per neurology.  Heart palpitations Patient appears to be in a normal sinus rhythm at this time.  She had just recently been set up with a Holter monitor due to reports of palpitations.  Stable TSH, on beta-blocker continue.  Stable on telemetry here.  Up with cardiology postdischarge for Holter monitor study.   Essential hypertension Home blood pressure regimen includes losartan 25 mg daily and Aldactone 12.5 mg daily, continue   Prediabetes C6.1 placed on low-carb diet, PCP to monitor.   Diastolic dysfunction Patient appears to be euvolemic on physical exam.  Last EF was noted to be 60 to 65% with grade 1 diastolic dysfunction back in 03/2021.  Stable TTE here.    Hyperlipidemia Well above goal statin dose adjusted this admission.    History of brain aneurysm Patient noted to have a  stable 5 mm periophthalmic right ICA aneurysm unchanged in size on prior CT angiogram of the head and neck from 03/2021.  Follow-up with [facility] neurology outpatient Home with PCP.   OSA -Continue CPAP nightly   Obesity -BMI of 38 follow-up with PCP for weight loss   Discharge diagnosis     Principal Problem:   TIA (transient ischemic attack) Active Problems:   Heart palpitations   Essential hypertension   Pre-diabetes   Diastolic dysfunction   Hyperlipidemia   Hypokalemia   Aneurysm   OSA (obstructive sleep apnea)   Obesity (BMI 30-39.9)    Discharge instructions    Discharge Instructions     Discharge instructions   Complete by: As directed    Follow with Primary Parks Stephanie Parks in 7 days   Get CBC, CMP, A1c  -  checked next visit with your primary Parks    Activity: As tolerated with Full fall precautions use walker/cane & assistance as needed  Disposition Home    Diet: Heart Healthy - Low Carb  Special Instructions: If you  have smoked or chewed Tobacco  in the last 2 yrs please stop smoking, stop any regular Alcohol  and or any Recreational drug use.  On your next visit with your primary care physician please Get Medicines reviewed and adjusted.  Please request your Prim.Parks to go over all Hospital Tests and Procedure/Radiological results at the follow up, please get all Hospital records sent to your Prim Parks by signing hospital release before you go home.  If you experience worsening of your admission symptoms, develop shortness of breath, life threatening emergency, suicidal or homicidal thoughts you must seek medical attention immediately by calling 911 or calling your Parks immediately  if symptoms less severe.  You Must read complete instructions/literature along with all the possible adverse reactions/side effects for all the Medicines you take and that have been prescribed to you. Take any new Medicines after you have completely understood and accpet all the possible adverse reactions/side effects.   Increase activity slowly   Complete by: As directed        Discharge Medications   Allergies as of 06/09/2022       Reactions   Shellfish Allergy Hives, Swelling   Ace Inhibitors Other (See Comments)   Pt cannot recall her reaction but tolerates arb    Gabapentin Other (See Comments)   headaches   Pitavastatin    Unknown reaction Other Reaction(s): Other (See Comments) ADVERSE REACTION,  Pt unaware of this reaction; states she has been on crestor in the past with no issues   Sulfa Antibiotics Other (See Comments), Rash   Fine bumps   Topiramate Palpitations   Heart race,         Medication List     TAKE these medications    ALIVE MULTI-VITAMIN PO Take 1 tablet by mouth daily.   aspirin EC 81 MG tablet Take 1 tablet (81 mg total) by mouth daily. Swallow whole.   B-12 PO Take 1 tablet by mouth daily.   CALCIUM 600 + D PO Take 1 tablet by mouth daily.   carboxymethylcellulose 0.5 %  Soln Commonly known as: REFRESH PLUS Place 1 drop into both eyes 3 (three) times daily as needed (dry eyes).   ELDERBERRY PO Take 1 capsule by mouth daily.   EPINEPHrine 0.3 mg/0.3 mL Soaj injection Commonly known as: EpiPen 2-Pak Inject 0.3 mg into the muscle as needed for anaphylaxis. Per allergen immunotherapy protocol  fluticasone 50 MCG/ACT nasal spray Commonly known as: FLONASE Place 2 sprays into both nostrils daily as needed for allergies.   Klor-Con M20 20 MEQ tablet Generic drug: potassium chloride SA TAKE 2 TABLETS BY MOUTH EVERY DAY   losartan 100 MG tablet Commonly known as: COZAAR Take 0.5 tablets (50 mg total) by mouth daily.   NON FORMULARY Pt uses a cpap nightly   nortriptyline 25 MG capsule Commonly known as: PAMELOR Take 2 capsules (50 mg total) by mouth at bedtime.   polyethylene glycol 17 g packet Commonly known as: MiraLax Take 17 g by mouth daily. What changed:  when to take this reasons to take this   rosuvastatin 20 MG tablet Commonly known as: CRESTOR Take 1 tablet (20 mg total) by mouth daily. What changed:  medication strength how much to take   spironolactone 25 MG tablet Commonly known as: ALDACTONE Take 12.5 mg by mouth daily.         Follow-up Information     Stephanie Parks. Schedule an appointment as soon as possible for a visit in 1 week(s).   Specialty: Family Medicine Contact information: [address] [place] [address] [phone]         [facility]. Schedule an appointment as soon as possible for a visit in 2 week(s).   Contact information: [address]     [address] [place] [address] [phone]                Major procedures and Radiology Reports - PLEASE review detailed and final reports thoroughly  -        ECHOCARDIOGRAM COMPLETE  Result Date: 06/09/2022    ECHOCARDIOGRAM REPORT   Patient Name:   Camie Patience  Date of Exam: 06/09/2022 Medical Rec #:  [id number]        Height:       64.0 in Accession #:    [id number]       Weight:       221.6 lb Date of Birth:  [birth year]        BSA:          2.043 m Patient Age:    76 years         BP:           125/77 mmHg Patient Gender: F                HR:           91 bpm. Exam Location:  Inpatient Procedure: 2D Echo, Color Doppler and Cardiac Doppler Indications:    TIA  History:        Patient has prior history of Echocardiogram examinations, most                 recent 04/03/2021. CAD; Risk Factors:Hypertension, Dyslipidemia                 and Sleep Apnea.  Sonographer:    Delcie Roch RDCS Referring Phys: [phone] RONDELL A SMITH  Sonographer Comments: Image acquisition challenging due to patient body habitus. IMPRESSIONS  1. Left ventricular ejection fraction, by estimation, is 60 to 65%. The left ventricle has normal function. The left ventricle has no regional wall motion abnormalities. There is mild left ventricular hypertrophy. Indeterminate diastolic filling due to E-A fusion.  2. Right ventricular systolic function is normal. The right ventricular size is normal. There is normal pulmonary artery systolic pressure. The  estimated right ventricular systolic pressure is 33.0 mmHg.  3. The mitral valve is normal in structure. No evidence of mitral valve regurgitation. No evidence of mitral stenosis.  4. The aortic valve is tricuspid. There is mild thickening of the aortic valve. Aortic valve regurgitation is not visualized. No aortic stenosis is present.  5. The inferior vena cava is normal in size with greater than 50% respiratory variability, suggesting right atrial pressure of 3 mmHg. Conclusion(s)/Recommendation(s): No intracardiac source of embolism detected on this transthoracic study. Consider a transesophageal echocardiogram to exclude cardiac source of embolism if clinically indicated. FINDINGS  Left Ventricle: Left ventricular ejection fraction, by estimation, is 60  to 65%. The left ventricle has normal function. The left ventricle has no regional wall motion abnormalities. The left ventricular internal cavity size was normal in size. There is  mild left ventricular hypertrophy. Indeterminate diastolic filling due to E-A fusion. Right Ventricle: The right ventricular size is normal. No increase in right ventricular wall thickness. Right ventricular systolic function is normal. There is normal pulmonary artery systolic pressure. The tricuspid regurgitant velocity is 2.74 m/s, and  with an assumed right atrial pressure of 3 mmHg, the estimated right ventricular systolic pressure is 33.0 mmHg. Left Atrium: Left atrial size was normal in size. Right Atrium: Right atrial size was normal in size. Pericardium: There is no evidence of pericardial effusion. Presence of epicardial fat layer. Mitral Valve: The mitral valve is normal in structure. No evidence of mitral valve regurgitation. No evidence of mitral valve stenosis. Tricuspid Valve: The tricuspid valve is normal in structure. Tricuspid valve regurgitation is trivial. No evidence of tricuspid stenosis. Aortic Valve: The aortic valve is tricuspid. There is mild thickening of the aortic valve. Aortic valve regurgitation is not visualized. No aortic stenosis is present. Pulmonic Valve: The pulmonic valve was normal in structure. Pulmonic valve regurgitation is trivial. No evidence of pulmonic stenosis. Aorta: The aortic root is normal in size and structure. Venous: The inferior vena cava is normal in size with greater than 50% respiratory variability, suggesting right atrial pressure of 3 mmHg. IAS/Shunts: No atrial level shunt detected by color flow Doppler.  LEFT VENTRICLE PLAX 2D LVIDd:         4.30 cm Diastology LVIDs:         2.80 cm LV e' medial:  9.03 cm/s LV PW:         0.90 cm LV e' lateral: 9.90 cm/s LV IVS:        1.00 cm  RIGHT VENTRICLE             IVC RV Basal diam:  2.40 cm     IVC diam: 2.05 cm RV S prime:     18.20  cm/s TAPSE (M-mode): 3.2 cm LEFT ATRIUM             Index        RIGHT ATRIUM           Index LA diam:        3.60 cm 1.76 cm/m   RA Area:     13.40 cm LA Vol (A2C):   41.7 ml 20.41 ml/m  RA Volume:   31.00 ml  15.17 ml/m LA Vol (A4C):   38.4 ml 18.79 ml/m LA Biplane Vol: 42.4 ml 20.75 ml/m  AORTIC VALVE LVOT Vmax:   91.40 cm/s LVOT Vmean:  60.300 cm/s LVOT VTI:    0.165 m  AORTA Ao Root diam: 2.90 cm Ao Asc diam:  3.40  cm TRICUSPID VALVE TR Peak grad:   30.0 mmHg TR Vmax:        274.00 cm/s  SHUNTS Systemic VTI: 0.16 m Weston Brass Parks Electronically signed by Weston Brass Parks Signature Date/Time: 06/09/2022/3:46:20 PM    Final    MR BRAIN WO CONTRAST  Result Date: 06/08/2022 CLINICAL DATA:  Stroke, follow-up.  Syncope. EXAM: MRI HEAD WITHOUT CONTRAST TECHNIQUE: Multiplanar, multiecho pulse sequences of the brain and surrounding structures were obtained without intravenous contrast. COMPARISON:  CTA head and neck 06/08/2022. MR head without contrast 09/28/2018 FINDINGS: Brain: No acute infarct, hemorrhage, or mass lesion is present. Scattered subcortical T2 hyperintensities demonstrates some progression since the prior study. Deep brain nuclei are within normal limits. The ventricles are of normal size. No significant extraaxial fluid collection is present. The brainstem and cerebellum are within normal limits. The internal auditory canals are within normal limits. Midline structures are within normal limits. Vascular: Flow is present in the major intracranial arteries. Skull and upper cervical spine: Degenerative changes are present at C2-3 and C3-4. Craniocervical junction is normal. Marrow signal is normal. Sinuses/Orbits: The paranasal sinuses and mastoid air cells are clear. The globes and orbits are within normal limits. IMPRESSION: 1. No acute intracranial abnormality or significant interval change. 2. Progressive scattered subcortical T2 hyperintensities bilaterally. The finding is nonspecific  but can be seen in the setting of chronic microvascular ischemia, a demyelinating process such as multiple sclerosis, vasculitis, complicated migraine headaches, or as the sequelae of a prior infectious or inflammatory process. Electronically Signed   By: Marin Roberts M.D.   On: 06/08/2022 18:32   CT ANGIO HEAD NECK W WO CM  Result Date: 06/08/2022 CLINICAL DATA:  Syncope EXAM: CT ANGIOGRAPHY HEAD AND NECK WITH AND WITHOUT CONTRAST TECHNIQUE: Multidetector CT imaging of the head and neck was performed using the standard protocol during bolus administration of intravenous contrast. Multiplanar CT image reconstructions and MIPs were obtained to evaluate the vascular anatomy. Carotid stenosis measurements (when applicable) are obtained utilizing NASCET criteria, using the distal internal carotid diameter as the denominator. RADIATION DOSE REDUCTION: This exam was performed according to the departmental dose-optimization program which includes automated exposure control, adjustment of the mA and/or kV according to patient size and/or use of iterative reconstruction technique. CONTRAST:  75mL OMNIPAQUE IOHEXOL 350 MG/ML SOLN COMPARISON:  None Available. FINDINGS: CT HEAD FINDINGS Brain: There is no mass, hemorrhage or extra-axial collection. The size and configuration of the ventricles and extra-axial CSF spaces are normal. There is no acute or chronic infarction. The brain parenchyma is normal. Skull: The visualized skull base, calvarium and extracranial soft tissues are normal. Sinuses/Orbits: No fluid levels or advanced mucosal thickening of the visualized paranasal sinuses. No mastoid or middle ear effusion. The orbits are normal. CTA NECK FINDINGS SKELETON: There is no bony spinal canal stenosis. No lytic or blastic lesion. C4-6 ACDF. OTHER NECK: Normal pharynx, larynx and major salivary glands. No cervical lymphadenopathy. Unremarkable thyroid gland. UPPER CHEST: No pneumothorax or pleural effusion. No  nodules or masses. AORTIC ARCH: There is calcific atherosclerosis of the aortic arch. There is no aneurysm, dissection or hemodynamically significant stenosis of the visualized portion of the aorta. Conventional 3 vessel aortic branching pattern. The visualized proximal subclavian arteries are widely patent. Tortuous proximal right subclavian artery laterally displaces the internal jugular vein. RIGHT CAROTID SYSTEM: Normal without aneurysm, dissection or stenosis. LEFT CAROTID SYSTEM: Normal without aneurysm, dissection or stenosis. VERTEBRAL ARTERIES: Left dominant configuration. Both origins are clearly patent. There  is no dissection, occlusion or flow-limiting stenosis to the skull base (V1-V3 segments). CTA HEAD FINDINGS POSTERIOR CIRCULATION: --Vertebral arteries: Normal V4 segments. --Inferior cerebellar arteries: Normal. --Basilar artery: Normal. --Superior cerebellar arteries: Normal. --Posterior cerebral arteries (PCA): Normal. ANTERIOR CIRCULATION: --Intracranial internal carotid arteries: Normal. --Anterior cerebral arteries (ACA): Normal. Both A1 segments are present. Patent anterior communicating artery (a-comm). --Middle cerebral arteries (MCA): Normal. VENOUS SINUSES: As permitted by contrast timing, patent. ANATOMIC VARIANTS: None Review of the MIP images confirms the above findings. IMPRESSION: 1. No emergent large vessel occlusion or hemodynamically significant stenosis of the head or neck. 2.  Aortic atherosclerosis (ICD10-I70.0). Electronically Signed   By: Deatra Robinson M.D.   On: 06/08/2022 00:57   DG Chest 2 View  Result Date: 05/22/2022 CLINICAL DATA:  Shortness of breath EXAM: CHEST - 2 VIEW COMPARISON:  02/06/2022 FINDINGS: Normal heart size and mediastinal contours. No acute infiltrate or edema. No effusion or pneumothorax. No acute osseous findings. IMPRESSION: No evidence of active disease. Electronically Signed   By: Tiburcio Pea M.D.   On: 05/22/2022 07:56    Micro Results      No results Parks for this or any previous visit (from the past 240 hour(s)).  Today   Subjective    Stephanie Parks today has no headache,no chest abdominal pain,no new weakness tingling or numbness, feels much better wants to go home today.     Objective   Blood pressure 125/77, pulse 95, temperature 99.5 F (37.5 C), temperature source Oral, resp. rate 19, height 5\' 4"  (1.626 m), weight 100.5 kg, SpO2 96 %.   Intake/Output Summary (Last 24 hours) at 06/09/2022 1550 Last data filed at 06/09/2022 0327 Gross per 24 hour  Intake 1211.67 ml  Output --  Net 1211.67 ml    Exam  Awake Alert, No new F.N deficits,    [place].AT,PERRAL Supple Neck,   Symmetrical Chest wall movement, Good air movement bilaterally, CTAB RRR,No Gallops,   +ve B.Sounds, Abd Soft, Non tender,  No Cyanosis, Clubbing or edema    Data Review   Recent Labs  Lab 06/07/22 2354 06/09/22 0347  WBC 8.2 6.9  HGB 11.0* 11.0*  HCT 33.9* 34.6*  PLT 308 269  MCV 90.2 91.8  MCH 29.3 29.2  MCHC 32.4 31.8  RDW 14.4 14.7  LYMPHSABS 2.9  --   MONOABS 0.5  --   EOSABS 0.3  --   BASOSABS 0.0  --     Recent Labs  Lab 06/07/22 2354 06/08/22 1525 06/09/22 0347  NA 138  --  139  K 3.4*  --  3.7  CL 103  --  103  CO2 26  --  27  ANIONGAP 9  --  9  GLUCOSE 108*  --  102*  BUN 11  --  13  CREATININE 0.96  --  1.11*  TSH  --   --  3.771  HGBA1C  --  6.1*  --   MG  --   --  1.9  CALCIUM 8.8*  --  8.8*   Lab Results  Component Value Date   CHOL 180 06/08/2022   HDL 67 06/08/2022   LDLCALC 101 (H) 06/08/2022   TRIG 62 06/08/2022   CHOLHDL 2.7 06/08/2022    Total Time in preparing paper work, data evaluation and todays exam - 35 minutes  Signature  -    Susa Raring M.D on 06/09/2022 at 3:50 PM   -  To page go to [email or web address]

## 2022-06-09 NOTE — Progress Notes (Signed)
  Echocardiogram 2D Echocardiogram has been performed.  Delcie Roch 06/09/2022, 2:31 PM

## 2022-06-09 NOTE — Evaluation (Signed)
Physical Therapy Evaluation Patient Details Name: Stephanie Parks MRN: [id number] DOB: Jan 18, 1946 Today's Date: 06/09/2022  History of Present Illness  77 y/o F admitted to [facility] on 4/19 with headache and visual changes and LUE weakness. MRI brain with no acute intracranial abnormality or significant interval change. CTA of head and neck no LVO. Admitted for TIA vs atypical migraine. PMH includes HTN, HLD, obesity, OSA on CPAP, brain aneurysm, migraines, GERD, heart palpatations, recent zio patch.  Clinical Impression  Pt presents with concerns for TIA, reports numbness/tingling and other symptoms have resolved except for pressure in her head. Pt able to ambulate without increase in symptoms, RW utilized throughout session and pt tolerating well, completing all mobility with supervision. Pt declining stair trial today but reports no concerns with mobility upon discharge, recommended initial use of RW upon return home, pt agreeable. Pt with no further benefit from skilled acute PT at this time, no current need for PT upon discharge, will sign off.      Recommendations for follow up therapy are one component of a multi-disciplinary discharge planning process, led by the attending physician.  Recommendations may be updated based on patient status, additional functional criteria and insurance authorization.  Follow Up Recommendations       Assistance Recommended at Discharge PRN  Patient can return home with the following  Help with stairs or ramp for entrance    Equipment Recommendations None recommended by PT  Recommendations for Other Services       Functional Status Assessment Patient has not had a recent decline in their functional status     Precautions / Restrictions Precautions Precautions: None Restrictions Weight Bearing Restrictions: No      Mobility  Bed Mobility               General bed mobility comments: pt in chair upon arrival, ended session in chair     Transfers Overall transfer level: Needs assistance Equipment used: None Transfers: Sit to/from Stand Sit to Stand: Supervision           General transfer comment: standing without difficulty from chair and toilet    Ambulation/Gait Ambulation/Gait assistance: Supervision Gait Distance (Feet): 250 Feet Assistive device: Rolling walker (2 wheels) Gait Pattern/deviations: Step-through pattern, Decreased stride length, Trunk flexed Gait velocity: decreased     General Gait Details: ambulated ~3 feet without AD, but reaching out for UE support, RW provided and cued for proximity, balance good with UE support. Mildly antalgic gait without AD on RLE  Stairs            Wheelchair Mobility    Modified Rankin (Stroke Patients Only)       Balance Overall balance assessment: Needs assistance Sitting-balance support: No upper extremity supported, Feet supported Sitting balance-Leahy Scale: Good     Standing balance support: Bilateral upper extremity supported, During functional activity, Reliant on assistive device for balance Standing balance-Leahy Scale: Poor Standing balance comment: reaching for UE support without AD during ambulation, improving with RW. Standing statically without UE support                             Pertinent Vitals/Pain Pain Assessment Pain Assessment: Faces Faces Pain Scale: Hurts little more Pain Location: head Pain Descriptors / Indicators: Pressure Pain Intervention(s): Limited activity within patient's tolerance, Monitored during session    Home Living Family/patient expects to be discharged to:: Private residence Living Arrangements: Children Available Help at Discharge: Family;Available  PRN/intermittently Type of Home: House Home Access: Stairs to enter Entrance Stairs-Rails: Left Entrance Stairs-Number of Steps: 5   Home Layout: One level Home Equipment: Cane - single Information systems manager (2 wheels)       Prior Function Prior Level of Function : Independent/Modified Independent;Driving             Mobility Comments: utilizes SPC in community and outdoors, pt reports pending R THA in June, denies any falls       Hand Dominance   Dominant Hand: Right    Extremity/Trunk Assessment   Upper Extremity Assessment Upper Extremity Assessment: Defer to OT evaluation    Lower Extremity Assessment Lower Extremity Assessment: Overall WFL for tasks assessed (sensation, strength, and coordination equal B)    Cervical / Trunk Assessment Cervical / Trunk Assessment: Normal  Communication   Communication: No difficulties  Cognition Arousal/Alertness: Awake/alert Behavior During Therapy: Flat affect Overall Cognitive Status: Within Functional Limits for tasks assessed                                 General Comments: A&Ox4        General Comments General comments (skin integrity, edema, etc.): VSS on room air    Exercises     Assessment/Plan    PT Assessment Patient does not need any further PT services  PT Problem List         PT Treatment Interventions      PT Goals (Current goals can be found in the Care Plan section)  Acute Rehab PT Goals Patient Stated Goal: go home PT Goal Formulation: All assessment and education complete, DC therapy    Frequency       Co-evaluation               AM-PAC PT "6 Clicks" Mobility  Outcome Measure Help needed turning from your back to your side while in a flat bed without using bedrails?: None Help needed moving from lying on your back to sitting on the side of a flat bed without using bedrails?: None Help needed moving to and from a bed to a chair (including a wheelchair)?: A Little Help needed standing up from a chair using your arms (e.g., wheelchair or bedside chair)?: A Little Help needed to walk in hospital room?: A Little Help needed climbing 3-5 steps with a railing? : A Little 6 Click Score: 20     End of Session Equipment Utilized During Treatment: Gait belt Activity Tolerance: Patient tolerated treatment well Patient left: in chair;with call bell/phone within reach Nurse Communication: Mobility status PT Visit Diagnosis: Other abnormalities of gait and mobility (R26.89)    Time: 0981-1914 PT Time Calculation (min) (ACUTE ONLY): 23 min   Charges:   PT Evaluation $PT Eval Low Complexity: 1 Low          Lindalou Hose, PT DPT Acute Rehabilitation Services Office [phone]   Leonie Man 06/09/2022, 3:25 PM

## 2022-06-14 ENCOUNTER — Other Ambulatory Visit: Payer: Self-pay | Admitting: Interventional Cardiology

## 2022-06-14 DIAGNOSIS — G4452 New daily persistent headache (NDPH): Secondary | ICD-10-CM | POA: Diagnosis not present

## 2022-06-14 DIAGNOSIS — Z87891 Personal history of nicotine dependence: Secondary | ICD-10-CM | POA: Diagnosis not present

## 2022-06-14 DIAGNOSIS — I10 Essential (primary) hypertension: Secondary | ICD-10-CM | POA: Diagnosis not present

## 2022-06-14 DIAGNOSIS — I720 Aneurysm of carotid artery: Secondary | ICD-10-CM | POA: Diagnosis not present

## 2022-06-14 DIAGNOSIS — I671 Cerebral aneurysm, nonruptured: Secondary | ICD-10-CM | POA: Diagnosis not present

## 2022-06-14 DIAGNOSIS — R519 Headache, unspecified: Secondary | ICD-10-CM | POA: Diagnosis not present

## 2022-06-14 DIAGNOSIS — H468 Other optic neuritis: Secondary | ICD-10-CM | POA: Diagnosis not present

## 2022-06-14 DIAGNOSIS — H2513 Age-related nuclear cataract, bilateral: Secondary | ICD-10-CM | POA: Diagnosis not present

## 2022-06-14 DIAGNOSIS — Z8679 Personal history of other diseases of the circulatory system: Secondary | ICD-10-CM | POA: Diagnosis not present

## 2022-06-14 DIAGNOSIS — Z8669 Personal history of other diseases of the nervous system and sense organs: Secondary | ICD-10-CM | POA: Diagnosis not present

## 2022-06-17 DIAGNOSIS — D649 Anemia, unspecified: Secondary | ICD-10-CM | POA: Diagnosis not present

## 2022-06-17 DIAGNOSIS — Z09 Encounter for follow-up examination after completed treatment for conditions other than malignant neoplasm: Secondary | ICD-10-CM | POA: Diagnosis not present

## 2022-06-17 DIAGNOSIS — I10 Essential (primary) hypertension: Secondary | ICD-10-CM | POA: Diagnosis not present

## 2022-06-17 DIAGNOSIS — I671 Cerebral aneurysm, nonruptured: Secondary | ICD-10-CM | POA: Diagnosis not present

## 2022-06-17 DIAGNOSIS — R7303 Prediabetes: Secondary | ICD-10-CM | POA: Diagnosis not present

## 2022-06-17 DIAGNOSIS — E876 Hypokalemia: Secondary | ICD-10-CM | POA: Diagnosis not present

## 2022-06-17 DIAGNOSIS — E7800 Pure hypercholesterolemia, unspecified: Secondary | ICD-10-CM | POA: Diagnosis not present

## 2022-06-17 DIAGNOSIS — R002 Palpitations: Secondary | ICD-10-CM | POA: Diagnosis not present

## 2022-06-20 DIAGNOSIS — R002 Palpitations: Secondary | ICD-10-CM | POA: Diagnosis not present

## 2022-06-25 DIAGNOSIS — I7789 Other specified disorders of arteries and arterioles: Secondary | ICD-10-CM | POA: Diagnosis not present

## 2022-06-25 DIAGNOSIS — M316 Other giant cell arteritis: Secondary | ICD-10-CM | POA: Diagnosis not present

## 2022-06-27 ENCOUNTER — Encounter: Payer: Self-pay | Admitting: Family Medicine

## 2022-06-27 NOTE — Telephone Encounter (Signed)
Error

## 2022-06-29 NOTE — Progress Notes (Signed)
[facility] [address] [place], [place] [address] [phone]  Date:  07/01/2022   Name:  Stephanie Parks   DOB:  1945/11/22   MRN:  [id number]  PCP:  Pearline Cables, MD    Chief Complaint: No chief complaint on file.   History of Present Illness:  Stephanie Parks is a 77 y.o. very pleasant female patient who presents with the following:  Pt seen today for medication follow-up Last seen by myself in December  History of hypertension, hyperlipidemia, allergic rhinitis, OSA on CPAP. She has tended to have frequent ER visits for chest pain likely related to anxiety She also had COVID-19 complicated by pulmonary embolism January of 2022 -She has completed treatment with Eliquis and is no longer using oxygen.  Lumbar decompression 1998   She was seen in the ER on 4/3 with lightheadedness Admitted last month as well: Admit date: 06/07/2022  Discharge date: 06/09/2022  Complicated Migraine -   Patient presents with acute onset of bitemporal headache with blurry vision, slurred speech, and paresthesias of the left arm which lasted for few seconds, now only has mild headache, MRI rules out acute stroke, by neurology Case discussed with stroke team attending Dr. Pearlean Brownie in detail on 06/09/2022, aspirin 81 mg, increased dose statin as LDL was above goal.  Thereafter follow-up with PCP and neurology outpatient for complex migraine and secondary risk factors modification for TIA/CVA.  This episode most likely was atypical migraine per neurology. Heart palpitations Patient appears to be in a normal sinus rhythm at this time.  She had just recently been set up with a Holter monitor due to reports of palpitations.  Stable TSH, on beta-blocker continue.  Stable on telemetry here.  Up with cardiology postdischarge for Holter monitor study. Essential hypertension Home blood pressure regimen includes losartan 25 mg daily and Aldactone 12.5  mg daily, continue Prediabetes C6.1 placed on low-carb diet, PCP to monitor. Diastolic dysfunction Patient appears to be euvolemic on physical exam.  Last EF was noted to be 60 to 65% with grade 1 diastolic dysfunction back in 03/2021.  Stable TTE here. Hyperlipidemia Well above goal statin dose adjusted this admission. History of brain aneurysm Patient noted to have a stable 5 mm periophthalmic right ICA aneurysm unchanged in size on prior CT angiogram of the head and neck from 03/2021.  Follow-up with [facility] neurology outpatient Home with PCP. OSA -Continue CPAP nightly Obesity -BMI of 38 follow-up with PCP for weight loss  Seen in the ER again on 4/26 with headache- was eventually dx with temporal arteritis? Path She was seen by an internal med doc with [facility] 4/29 to establish care?   Most recent cardiology visit 06/04/22: HTN: Continue current blood pressure medicines.  Will try to clarify with Dr. Patsy Lager regarding dosages as she received a different dosages in the mail recently.  Home readings well controlled. BP Should be ok for hip surgery. Palpitations: Worse when she has pain.  I suspect this is sinus tach.    2 week Zio patch as sx are worsening.  Hoping to have hip replacement.  Reports fatigue.  May be related to deconditioning from lack of activity due to hip pain.  No signs of congestive heart failure. LE edema: elevate legs. Somewhat frustrated that her tests come back normal and she still feels tired.   Patient Active Problem List   Diagnosis Date Noted   TIA (transient ischemic  attack) 06/08/2022   Diastolic dysfunction 06/08/2022   Obesity (BMI 30-39.9) 06/08/2022   OSA (obstructive sleep apnea) 06/08/2022   Unilateral primary osteoarthritis, right hip 05/29/2022   Status post total replacement of left hip 09/04/2021   DJD (degenerative joint disease) 09/04/2021   Iron deficiency anemia 03/13/2021   Erythropoietin deficiency anemia 03/13/2021   Unilateral primary  osteoarthritis, left hip 10/18/2020   Greater trochanteric bursitis of left hip 10/18/2020   Headache 06/07/2020   Acute respiratory failure with hypoxia (HCC) 05/02/2020   Post-COVID chronic dyspnea 05/02/2020   Pulsatile neck mass 04/26/2020   Pneumonia due to COVID-19 virus 03/12/2020   Headache disorder 01/04/2020   Seasonal allergic conjunctivitis 10/04/2019   Dysfunction of right eustachian tube 10/04/2019   Pre-diabetes 06/02/2019   Seasonal and perennial allergic rhinitis 12/22/2018   Heart palpitations 12/22/2018   History of chest pain 07/20/2018   Brain aneurysm 07/20/2018   Dyslipidemia 05/04/2018   Tendonitis, Achilles, right 12/08/2017   Unilateral primary osteoarthritis, left knee 10/07/2017   Ingrown toenail 08/10/2017   Coughing 04/24/2017   CAD (coronary artery disease) 03/01/2015   Aneurysm (HCC) 03/01/2015   Dizziness 03/01/2015   Hypokalemia 03/01/2015   Paresthesia of arm 03/01/2015   Achalasia 09/28/2012   CN (constipation) 09/28/2012   Cephalalgia 08/24/2012   Essential hypertension 04/19/2011   Hyperlipidemia 04/19/2011   GERD (gastroesophageal reflux disease) 04/19/2011   Chest pain 04/04/2011    Past Medical History:  Diagnosis Date   Achalasia 09/28/2012   Allergic rhinitis 03/01/2015   Anemia    Aneurysm (HCC) 03/01/2015   stable 5 mm periopthalmic right ICA aneurysm 03/25/21 CTA   BP (high blood pressure) 03/01/2015   Bronchitis 04/23/2018   Cephalalgia 08/24/2012   Overview:  ICD-10 cut over     Chest pain 04/04/2011   Coronary Ca Score 0, no significant CAD 08/20/18; CP with troponin 4>32 s/p LHC showing normal coronaries 12/29/18   CN (constipation) 09/28/2012   Coughing 04/24/2017   Decreased potassium in the blood 03/01/2015   Diverticulosis    Dizziness 03/01/2015   Erythropoietin deficiency anemia 03/13/2021   GERD (gastroesophageal reflux disease)    Hiatal hernia    Hypercholesterolemia 03/01/2015   Hyperlipidemia     Hypertension    Ingrown toenail 08/10/2017   Inguinal hernia    Iron deficiency anemia 03/13/2021   Migraine headache    Onychomycosis 12/08/2017   Paresthesia of arm 03/01/2015   PE (pulmonary thromboembolism) (HCC) 03/13/2020   in setting of + COVID-19, s/p Eliquis x 3 month   Schatzki's ring    Tendonitis, Achilles, right 12/08/2017   Unilateral primary osteoarthritis, left knee 10/07/2017    Past Surgical History:  Procedure Laterality Date   ABDOMINAL HYSTERECTOMY     BACK SURGERY     x 3   CARDIAC CATHETERIZATION  12/29/2018   angiographically normal coronary arteries 12/29/18 ([place])   CARPAL TUNNEL RELEASE     left wrist   CERVICAL SPINE SURGERY     HEMORRHOID SURGERY     TOTAL HIP ARTHROPLASTY Left 09/04/2021   Procedure: LEFT TOTAL HIP ARTHROPLASTY ANTERIOR APPROACH;  Surgeon: Kathryne Hitch, MD;  Location: [facility] OR;  Service: Orthopedics;  Laterality: Left;    Social History   Tobacco Use   Smoking status: Former    Packs/day: 0.50    Years: 20.00    Additional pack years: 0.00    Total pack years: 10.00    Types: Cigarettes    Quit date:  02/19/1984    Years since quitting: 38.3   Smokeless tobacco: Never  Vaping Use   Vaping Use: Never used  Substance Use Topics   Alcohol use: No   Drug use: No    Family History  Problem Relation Age of Onset   Allergic rhinitis Sister    Diabetes Sister    Hypertension Sister    Heart attack Father 92   Heart disease Father    Stomach cancer Maternal Grandmother    Heart disease Mother    Colon cancer Neg Hx    Angioedema Neg Hx    Asthma Neg Hx    Eczema Neg Hx    Urticaria Neg Hx    Immunodeficiency Neg Hx    Breast cancer Neg Hx    Migraines Neg Hx    Headache Neg Hx     Allergies  Allergen Reactions   Shellfish Allergy Hives and Swelling   Ace Inhibitors Other (See Comments)    Pt cannot recall her reaction but tolerates arb    Gabapentin Other (See Comments)    headaches    Pitavastatin     Unknown reaction  Other Reaction(s): Other (See Comments)  ADVERSE REACTION,  Pt unaware of this reaction; states she has been on crestor in the past with no issues   Sulfa Antibiotics Other (See Comments) and Rash    Fine bumps   Topiramate Palpitations    Heart race,     Medication list has been reviewed and updated.  Current Outpatient Medications on File Prior to Visit  Medication Sig Dispense Refill   aspirin EC 81 MG tablet Take 1 tablet (81 mg total) by mouth daily. Swallow whole. 30 tablet 0   Calcium Carb-Cholecalciferol (CALCIUM 600 + D PO) Take 1 tablet by mouth daily.     carboxymethylcellulose (REFRESH PLUS) 0.5 % SOLN Place 1 drop into both eyes 3 (three) times daily as needed (dry eyes).     Cyanocobalamin (B-12 PO) Take 1 tablet by mouth daily.     ELDERBERRY PO Take 1 capsule by mouth daily.     EPINEPHrine (EPIPEN 2-PAK) 0.3 mg/0.3 mL IJ SOAJ injection Inject 0.3 mg into the muscle as needed for anaphylaxis. Per allergen immunotherapy protocol 1 each 2   fluticasone (FLONASE) 50 MCG/ACT nasal spray Place 2 sprays into both nostrils daily as needed for allergies. 48 g 3   KLOR-CON M20 20 MEQ tablet TAKE 2 TABLETS BY MOUTH EVERY DAY 180 tablet 1   losartan (COZAAR) 100 MG tablet Take 0.5 tablets (50 mg total) by mouth daily. 45 tablet 3   Multiple Vitamins-Minerals (ALIVE MULTI-VITAMIN PO) Take 1 tablet by mouth daily.     NON FORMULARY Pt uses a cpap nightly     nortriptyline (PAMELOR) 25 MG capsule Take 2 capsules (50 mg total) by mouth at bedtime. 180 capsule 3   polyethylene glycol (MIRALAX) 17 g packet Take 17 g by mouth daily. (Patient taking differently: Take 17 g by mouth daily as needed for mild constipation or moderate constipation.) 14 each 0   rosuvastatin (CRESTOR) 20 MG tablet Take 1 tablet (20 mg total) by mouth daily. 30 tablet 0   spironolactone (ALDACTONE) 25 MG tablet Take 0.5 tablets (12.5 mg total) by mouth daily. 45 tablet 3   No  current facility-administered medications on file prior to visit.    Review of Systems:  As per HPI- otherwise negative.   Physical Examination: There were no vitals filed for this visit. There  were no vitals filed for this visit. There is no height or weight on file to calculate BMI. Ideal Body Weight:    GEN: no acute distress. HEENT: Atraumatic, Normocephalic.  Ears and Nose: No external deformity. CV: RRR, No M/G/R. No JVD. No thrill. No extra heart sounds. PULM: CTA B, no wheezes, crackles, rhonchi. No retractions. No resp. distress. No accessory muscle use. ABD: S, NT, ND, +BS. No rebound. No HSM. EXTR: No c/c/e PSYCH: Normally interactive. Conversant.    Assessment and Plan: ***  Signed Lamar Blinks, MD

## 2022-07-01 ENCOUNTER — Telehealth (INDEPENDENT_AMBULATORY_CARE_PROVIDER_SITE_OTHER): Payer: Medicare Other | Admitting: Family Medicine

## 2022-07-01 DIAGNOSIS — K5909 Other constipation: Secondary | ICD-10-CM

## 2022-07-01 MED ORDER — POLYETHYLENE GLYCOL 3350 17 G PO PACK
17.0000 g | PACK | Freq: Every day | ORAL | 2 refills | Status: AC | PRN
Start: 2022-07-01 — End: ?

## 2022-07-09 DIAGNOSIS — G4489 Other headache syndrome: Secondary | ICD-10-CM | POA: Diagnosis not present

## 2022-07-11 ENCOUNTER — Encounter (HOSPITAL_BASED_OUTPATIENT_CLINIC_OR_DEPARTMENT_OTHER): Payer: Self-pay | Admitting: Pediatrics

## 2022-07-11 ENCOUNTER — Emergency Department (HOSPITAL_BASED_OUTPATIENT_CLINIC_OR_DEPARTMENT_OTHER)
Admission: EM | Admit: 2022-07-11 | Discharge: 2022-07-11 | Disposition: A | Discharge status: Home / Self Care | Payer: Medicare Other | Attending: Emergency Medicine | Admitting: Emergency Medicine

## 2022-07-11 ENCOUNTER — Emergency Department (HOSPITAL_BASED_OUTPATIENT_CLINIC_OR_DEPARTMENT_OTHER): Payer: Medicare Other

## 2022-07-11 ENCOUNTER — Telehealth: Payer: Self-pay | Admitting: Family Medicine

## 2022-07-11 ENCOUNTER — Other Ambulatory Visit: Payer: Self-pay

## 2022-07-11 DIAGNOSIS — Z7982 Long term (current) use of aspirin: Secondary | ICD-10-CM | POA: Insufficient documentation

## 2022-07-11 DIAGNOSIS — Z20822 Contact with and (suspected) exposure to covid-19: Secondary | ICD-10-CM | POA: Diagnosis not present

## 2022-07-11 DIAGNOSIS — R0602 Shortness of breath: Secondary | ICD-10-CM | POA: Diagnosis not present

## 2022-07-11 DIAGNOSIS — J189 Pneumonia, unspecified organism: Secondary | ICD-10-CM | POA: Diagnosis not present

## 2022-07-11 DIAGNOSIS — R0609 Other forms of dyspnea: Secondary | ICD-10-CM

## 2022-07-11 DIAGNOSIS — Z79899 Other long term (current) drug therapy: Secondary | ICD-10-CM | POA: Diagnosis not present

## 2022-07-11 DIAGNOSIS — R197 Diarrhea, unspecified: Secondary | ICD-10-CM | POA: Insufficient documentation

## 2022-07-11 DIAGNOSIS — I10 Essential (primary) hypertension: Secondary | ICD-10-CM | POA: Diagnosis not present

## 2022-07-11 DIAGNOSIS — I2510 Atherosclerotic heart disease of native coronary artery without angina pectoris: Secondary | ICD-10-CM | POA: Diagnosis not present

## 2022-07-11 DIAGNOSIS — H9203 Otalgia, bilateral: Secondary | ICD-10-CM | POA: Diagnosis not present

## 2022-07-11 LAB — URINALYSIS, W/ REFLEX TO CULTURE (INFECTION SUSPECTED)
Bilirubin Urine: NEGATIVE
Glucose, UA: NEGATIVE mg/dL
Hgb urine dipstick: NEGATIVE
Ketones, ur: NEGATIVE mg/dL
Leukocytes,Ua: NEGATIVE
Nitrite: NEGATIVE
Protein, ur: NEGATIVE mg/dL
RBC / HPF: NONE SEEN RBC/hpf (ref 0–5)
Specific Gravity, Urine: 1.015 (ref 1.005–1.030)
WBC, UA: NONE SEEN WBC/hpf (ref 0–5)
pH: 7.5 (ref 5.0–8.0)

## 2022-07-11 LAB — COMPREHENSIVE METABOLIC PANEL
ALT: 19 U/L (ref 0–44)
AST: 20 U/L (ref 15–41)
Albumin: 3.7 g/dL (ref 3.5–5.0)
Alkaline Phosphatase: 63 U/L (ref 38–126)
Anion gap: 11 (ref 5–15)
BUN: 21 mg/dL (ref 8–23)
CO2: 27 mmol/L (ref 22–32)
Calcium: 9.4 mg/dL (ref 8.9–10.3)
Chloride: 100 mmol/L (ref 98–111)
Creatinine, Ser: 1.05 mg/dL — ABNORMAL HIGH (ref 0.44–1.00)
GFR, Estimated: 55 mL/min — ABNORMAL LOW (ref >60)
Glucose, Bld: 111 mg/dL — ABNORMAL HIGH (ref 70–99)
Potassium: 3.6 mmol/L (ref 3.5–5.1)
Sodium: 138 mmol/L (ref 135–145)
Total Bilirubin: 0.5 mg/dL (ref 0.3–1.2)
Total Protein: 7.1 g/dL (ref 6.5–8.1)

## 2022-07-11 LAB — CBC WITH DIFFERENTIAL/PLATELET
Abs Immature Granulocytes: 0.14 10*3/uL — ABNORMAL HIGH (ref 0.00–0.07)
Basophils Absolute: 0 10*3/uL (ref 0.0–0.1)
Basophils Relative: 0 %
Eosinophils Absolute: 0 10*3/uL (ref 0.0–0.5)
Eosinophils Relative: 0 %
HCT: 38.3 % (ref 36.0–46.0)
Hemoglobin: 12.4 g/dL (ref 12.0–15.0)
Immature Granulocytes: 1 %
Lymphocytes Relative: 7 %
Lymphs Abs: 1 10*3/uL (ref 0.7–4.0)
MCH: 30 pg (ref 26.0–34.0)
MCHC: 32.4 g/dL (ref 30.0–36.0)
MCV: 92.5 fL (ref 80.0–100.0)
Monocytes Absolute: 0.3 10*3/uL (ref 0.1–1.0)
Monocytes Relative: 2 %
Neutro Abs: 13.6 10*3/uL — ABNORMAL HIGH (ref 1.7–7.7)
Neutrophils Relative %: 90 %
Platelets: 255 10*3/uL (ref 150–400)
RBC: 4.14 MIL/uL (ref 3.87–5.11)
RDW: 17 % — ABNORMAL HIGH (ref 11.5–15.5)
WBC: 15.1 10*3/uL — ABNORMAL HIGH (ref 4.0–10.5)
nRBC: 0 % (ref 0.0–0.2)

## 2022-07-11 LAB — TROPONIN I (HIGH SENSITIVITY)
Troponin I (High Sensitivity): 6 ng/L (ref <18)
Troponin I (High Sensitivity): 6 ng/L (ref <18)

## 2022-07-11 LAB — I-STAT VENOUS BLOOD GAS, ED
Acid-base deficit: 3 mmol/L — ABNORMAL HIGH (ref 0.0–2.0)
Bicarbonate: 19.4 mmol/L — ABNORMAL LOW (ref 20.0–28.0)
Calcium, Ion: 0.87 mmol/L — CL (ref 1.15–1.40)
HCT: 24 % — ABNORMAL LOW (ref 36.0–46.0)
Hemoglobin: 8.2 g/dL — ABNORMAL LOW (ref 12.0–15.0)
O2 Saturation: 98 %
Patient temperature: 98.6
Potassium: 3.5 mmol/L (ref 3.5–5.1)
Sodium: 144 mmol/L (ref 135–145)
TCO2: 20 mmol/L — ABNORMAL LOW (ref 22–32)
pCO2, Ven: 24.1 mmHg — ABNORMAL LOW (ref 44–60)
pH, Ven: 7.514 — ABNORMAL HIGH (ref 7.25–7.43)
pO2, Ven: 98 mmHg — ABNORMAL HIGH (ref 32–45)

## 2022-07-11 LAB — CULTURE, BLOOD (ROUTINE X 2)

## 2022-07-11 LAB — BRAIN NATRIURETIC PEPTIDE: B Natriuretic Peptide: 23.9 pg/mL (ref 0.0–100.0)

## 2022-07-11 LAB — LACTIC ACID, PLASMA
Lactic Acid, Venous: 2 mmol/L (ref 0.5–1.9)
Lactic Acid, Venous: 1.9 mmol/L (ref 0.5–1.9)

## 2022-07-11 LAB — SARS CORONAVIRUS 2 BY RT PCR: SARS Coronavirus 2 by RT PCR: NEGATIVE

## 2022-07-11 MED ORDER — KETOROLAC TROMETHAMINE 15 MG/ML IJ SOLN: 15.0000 mg | Freq: Once | INTRAMUSCULAR | Status: DC

## 2022-07-11 MED ORDER — SODIUM CHLORIDE 0.9 % IV SOLN
500.0000 mg | Freq: Once | INTRAVENOUS | Status: AC
  Administered 2022-07-11: 500 mg via INTRAVENOUS
  Filled 2022-07-11: qty 5

## 2022-07-11 MED ORDER — SODIUM CHLORIDE 0.9 % IV SOLN
2.0000 g | Freq: Once | INTRAVENOUS | Status: AC
  Administered 2022-07-11: 2 g via INTRAVENOUS
  Filled 2022-07-11: qty 20

## 2022-07-11 MED ORDER — DOXYCYCLINE HYCLATE 100 MG PO CAPS: 100.0000 mg | ORAL_CAPSULE | Freq: Two times a day (BID) | ORAL | 0 refills | Status: DC

## 2022-07-11 MED ORDER — SODIUM CHLORIDE 0.9 % IV BOLUS (SEPSIS)
1000.0000 mL | Freq: Once | INTRAVENOUS | Status: AC
  Administered 2022-07-11: 1000 mL via INTRAVENOUS

## 2022-07-11 NOTE — ED Provider Notes (Signed)
Care of patient received from prior provider at 5:48 PM, please see their note for complete H/P and care plan.  Received handoff per ED course.  Clinical Course as of 07/11/22 1748  Thu Jul 11, 2022  1512 Handoff: Stable 77 year old female presenting with a chief complaint of shortness of breath.  Recent admission for TIA observation today with cough fever malaise fatigue. Being treated for community-acquired pneumonia per prior provider.  White count elevated, recommended for admission per prior provider but patient has deferred at this time.  She is wanting to see if her symptoms improve enough to go home with antibiotics given her recent admission. Will reassess. [CC]    Clinical Course User Index [CC] Glyn Ade, MD    Reassessment: Reassessed at bedside.  She states that her symptoms are all resolved she is ambulatory tolerating p.o. intake grossly asymptomatic.  Denies fevers chills nausea vomiting syncope shortness of breath.  Recommended admission for pneumonia.  Patient has declined admission feels more comfortable discharge and follow-up with primary care provider in the outpatient setting. Has been treated with Rocephin azithromycin initially.  Will narrow to monotherapy on doxycycline for community-acquired pneumonia and plan for close follow-up  Disposition:  Patient is requesting discharge at this time.  Given patient's understanding of risk of severe missed diagnosis based on limitations of today's evaluation and risk of interval worsening of disease including life or limb threatening pathology, will participate in shared medical decision making and patient directed discharge at this time.  Patient is welcome to return for further diagnostic evaluation/therapeutic management at any time.      Glyn Ade, MD 07/11/22 [phone]

## 2022-07-11 NOTE — Telephone Encounter (Signed)
FYI:  Triage called and advised that the pt be seen within 4 hours. I let her know that we do not have anything until tomorrow. She will see if the pt will go to ER since she has SOB with exertion.

## 2022-07-11 NOTE — Telephone Encounter (Signed)
Initial Comment Caller states she is having SOB and diarrhea. She has urinated in the last 8 hours. She has a BP of 129/59. Translation No Nurse Assessment Nurse: Nunzio Cory, RN, Sherrie Date/Time (Eastern Time): 07/11/2022 11:00:40 AM Confirm and document reason for call. If symptomatic, describe symptoms. ---Caller states having +SOB, COVID home test Neg. States has been on Prednisone x 3 weeks. +Swelling in both ankles. Diarrhea, every times eats has loose stool. No abd pain. No fever, +fluids, +UOP in last 8 hrs. BP 20 min ago 129/59 HR 122. Does the patient have any new or worsening symptoms? ---Yes Will a triage be completed? ---Yes Related visit to physician within the last 2 weeks? ---Yes Does the PT have any chronic conditions? (i.e. diabetes, asthma, this includes High risk factors for pregnancy, etc.) ---Yes List chronic conditions. ---HTN, Fast HR, Is this a behavioral health or substance abuse call? ---No Guidelines Guideline Title Affirmed Question Affirmed Notes Nurse Date/Time (Eastern Time) Breathing Difficulty [1] MILD difficulty breathing (e.g., minimal/no SOB at rest, SOB with walking, pulse <100) AND [2] NEW-onset Nunzio Cory, RN, Sherrie 07/11/2022 11:06:39 AM PLEASE NOTE: All timestamps contained within this report are represented as [place] Standard Time. CONFIDENTIALTY NOTICE: This fax transmission is intended only for the addressee. It contains information that is legally privileged, confidential or otherwise protected from use or disclosure. If you are not the intended recipient, you are strictly prohibited from reviewing, disclosing, copying using or disseminating any of this information or taking any action in reliance on or regarding this information. If you have received this fax in error, please notify us immediately by telephone so that we can arrange for its return to [place]. Phone: [phone], Toll-Free: [phone], Fax: [phone] Page: 2 of 2 Call Id:  [id number] Guidelines Guideline Title Affirmed Question Affirmed Notes Nurse Date/Time Lamount Cohen Time) or WORSE than normal Disp. Time Lamount Cohen Time) Disposition Final User 07/11/2022 10:55:04 AM Send to Urgent Queue Enid Cutter 07/11/2022 11:10:42 AM See HCP within 4 Hours (or PCP triage) Yes Nunzio Cory, RN, Sherrie Final Disposition 07/11/2022 11:10:42 AM See HCP within 4 Hours (or PCP triage) Yes Nunzio Cory, RN, Sherrie Caller Disagree/Comply Comply Caller Understands Yes PreDisposition InappropriateToAsk Care Advice Given Per Guideline SEE HCP (OR PCP TRIAGE) WITHIN 4 HOURS: * IF OFFICE WILL BE OPEN: You need to be seen within the next 3 or 4 hours. Call your doctor (or NP/PA) now or as soon as the office opens. CALL BACK IF: * You become worse Comments User: Holland Commons, RN Date/Time (Eastern Time): 07/11/2022 11:06:34 AM States SOB started yesterday. Referrals Warm transfer to backline [facility] - ED

## 2022-07-11 NOTE — Telephone Encounter (Signed)
Per Melton Alar- was informed to go to ER.

## 2022-07-11 NOTE — Telephone Encounter (Signed)
FYI: pt is at the ED 

## 2022-07-11 NOTE — ED Triage Notes (Signed)
C/O shortness of breath started yesterday, c/o diarrhea x 1 occurrence today. C/O right sided ear pain started yesterday. Added pain in mid sternal area radiating to the back, endorsed hx of hernia,

## 2022-07-11 NOTE — Telephone Encounter (Signed)
FYI: This call has been transferred to Access Nurse. Once the result note has been entered staff can address the message at that time.  Patient called in with the following symptoms:  Red Word: SOB   Please advise at Mobile [phone] (mobile)  Message is routed to Provider Pool and [facility] Triage

## 2022-07-11 NOTE — ED Notes (Signed)
Patient given discharge instructions. Questions were answered. Patient verbalized understanding of discharge instructions and care at home.  

## 2022-07-11 NOTE — ED Provider Notes (Signed)
EMERGENCY DEPARTMENT AT [facility] Provider Note   CSN: [id number] Arrival date & time: 07/11/22  1244     History  Chief Complaint  Patient presents with   Shortness of Breath   Otalgia   Diarrhea    Stephanie Parks is a 77 y.o. female.  With PMH of CAD, HTN, HLD, GERD, recent admission in April for TIA who presents with increasing dyspnea on exertion as well as generally feeling unwell.  She has had no fevers or chills at home or cough.  However she is complaining of dyspnea on exertion which is no as well as chills, nonbloody diarrhea and epigastrium discomfort and GERD like sensation which feels similar to previous hernia type pain.  She is also complaining of some pain in her ears.  She has had no orthopnea or PND however at baseline she has slept sitting up for many years.  She has been on steroids for the past few weeks for temporal arteritis rule out however the biopsy came back negative.  She feels like she has developed some moderate swelling in both legs since being on steroids.  She denies any history of asthma or smoking history.   Shortness of Breath Associated symptoms: ear pain   Otalgia Associated symptoms: diarrhea   Diarrhea      Home Medications Prior to Admission medications   Medication Sig Start Date End Date Taking? Authorizing Provider  aspirin EC 81 MG tablet Take 1 tablet (81 mg total) by mouth daily. Swallow whole. 06/09/22   Leroy Sea, MD  Calcium Carb-Cholecalciferol (CALCIUM 600 + D PO) Take 1 tablet by mouth daily.    [provider]  carboxymethylcellulose (REFRESH PLUS) 0.5 % SOLN Place 1 drop into both eyes 3 (three) times daily as needed (dry eyes).    [provider]  Cyanocobalamin (B-12 PO) Take 1 tablet by mouth daily.    [provider]  ELDERBERRY PO Take 1 capsule by mouth daily.    [provider]  EPINEPHrine (EPIPEN 2-PAK) 0.3 mg/0.3 mL IJ SOAJ injection Inject 0.3 mg  into the muscle as needed for anaphylaxis. Per allergen immunotherapy protocol 09/27/21   Copland, Gwenlyn Found, MD  fluticasone (FLONASE) 50 MCG/ACT nasal spray Place 2 sprays into both nostrils daily as needed for allergies. 04/11/21   Copland, Gwenlyn Found, MD  KLOR-CON M20 20 MEQ tablet TAKE 2 TABLETS BY MOUTH EVERY DAY 01/12/22   Copland, Gwenlyn Found, MD  losartan (COZAAR) 100 MG tablet Take 0.5 tablets (50 mg total) by mouth daily. 03/28/22   Corky Crafts, MD  Multiple Vitamins-Minerals (ALIVE MULTI-VITAMIN PO) Take 1 tablet by mouth daily.    [provider]  NON FORMULARY Pt uses a cpap nightly    [provider]  nortriptyline (PAMELOR) 25 MG capsule Take 2 capsules (50 mg total) by mouth at bedtime. 03/21/22   Lomax, Amy, NP  polyethylene glycol (MIRALAX) 17 g packet Take 17 g by mouth daily as needed for mild constipation or moderate constipation. 07/01/22   Copland, Gwenlyn Found, MD  rosuvastatin (CRESTOR) 20 MG tablet Take 1 tablet (20 mg total) by mouth daily. 06/09/22   Leroy Sea, MD  spironolactone (ALDACTONE) 25 MG tablet Take 0.5 tablets (12.5 mg total) by mouth daily. 06/14/22   Corky Crafts, MD      Allergies    Shellfish allergy, Ace inhibitors, Gabapentin, Pitavastatin, Sulfa antibiotics, and Topiramate    Review of Systems  Review of Systems  HENT:  Positive for ear pain.   Respiratory:  Positive for shortness of breath.   Gastrointestinal:  Positive for diarrhea.    Physical Exam Updated Vital Signs BP 137/70   Pulse (!) 104   Temp 98.7 F (37.1 C)   Resp 15   Ht 5\' 4"  (1.626 m)   Wt 105.7 kg   SpO2 97%   BMI 39.99 kg/m  Physical Exam Constitutional: Alert and oriented.  Speaking in full sentences no acute distress nontoxic Eyes: Conjunctivae are normal. ENT      Head: Normocephalic and atraumatic.      Neck: No stridor. Cardiovascular: S1, S2, tachycardic, regular rhythm Respiratory: Normal respiratory effort. Breath sounds are  normal.  O2 sat 97 on RA Gastrointestinal: Soft and nontender.  Musculoskeletal: Normal range of motion in all extremities. Trace equal nontender pretibial nonpitting edema Neurologic: Normal speech and language.  Moving all 4 extremities equally.  Sensation grossly intact.  No gross focal neurologic deficits are appreciated. Skin: Skin is warm, dry and intact. No rash noted. Psychiatric: Mood and affect are normal. Speech and behavior are normal.  ED Results / Procedures / Treatments   Labs (all labs ordered are listed, but only abnormal results are displayed) Labs Reviewed  COMPREHENSIVE METABOLIC PANEL - Abnormal; Notable for the following components:      Result Value   Glucose, Bld 111 (*)    Creatinine, Ser 1.05 (*)    GFR, Estimated 55 (*)    All other components within normal limits  CBC WITH DIFFERENTIAL/PLATELET - Abnormal; Notable for the following components:   WBC 15.1 (*)    RDW 17.0 (*)    Neutro Abs 13.6 (*)    Abs Immature Granulocytes 0.14 (*)    All other components within normal limits  LACTIC ACID, PLASMA - Abnormal; Notable for the following components:   Lactic Acid, Venous 2.0 (*)    All other components within normal limits  URINALYSIS, W/ REFLEX TO CULTURE (INFECTION SUSPECTED) - Abnormal; Notable for the following components:   Bacteria, UA RARE (*)    All other components within normal limits  I-STAT VENOUS BLOOD GAS, ED - Abnormal; Notable for the following components:   pH, Ven 7.514 (*)    pCO2, Ven 24.1 (*)    pO2, Ven 98 (*)    Bicarbonate 19.4 (*)    TCO2 20 (*)    Acid-base deficit 3.0 (*)    Calcium, Ion 0.87 (*)    HCT 24.0 (*)    Hemoglobin 8.2 (*)    All other components within normal limits  SARS CORONAVIRUS 2 BY RT PCR  CULTURE, BLOOD (ROUTINE X 2)  CULTURE, BLOOD (ROUTINE X 2)  LACTIC ACID, PLASMA  BRAIN NATRIURETIC PEPTIDE  TROPONIN I (HIGH SENSITIVITY)  TROPONIN I (HIGH SENSITIVITY)    EKG EKG  Interpretation  Date/Time:  Thursday Jul 11 2022 12:55:50 EDT Ventricular Rate:  110 PR Interval:  175 QRS Duration: 94 QT Interval:  303 QTC Calculation: 410 R Axis:   4 Text Interpretation: Sinus tachycardia Probable left atrial enlargement Low voltage, precordial leads Minimal ST depression, lateral leads , no reciprocal change Confirmed by Vivien Rossetti ([id number]) on 07/11/2022 1:04:09 PM  Radiology DG Chest 2 View  Result Date: 07/11/2022 CLINICAL DATA:  shortness of breath EXAM: CHEST - 2 VIEW COMPARISON:  CXR 05/22/22 FINDINGS: The heart size and mediastinal contours are within normal limits. No pleural effusion. No pneumothorax. No focal airspace opacity. There  are prominent bilateral interstitial opacities that can be seen in the setting of pulmonary congestion or atypical infection. The visualized skeletal structures are unremarkable. IMPRESSION: Prominent bilateral interstitial opacities that can be seen in the setting of pulmonary congestion or atypical infection. Electronically Signed   By: Lorenza Cambridge M.D.   On: 07/11/2022 13:30    Procedures .Critical Care  Performed by: Mardene Sayer, MD Authorized by: Mardene Sayer, MD   Critical care provider statement:    Critical care was necessary to treat or prevent imminent or life-threatening deterioration of the following conditions:  Sepsis   Critical care was time spent personally by me on the following activities:  Blood draw for specimens, development of treatment plan with patient or surrogate, evaluation of patient's response to treatment, examination of patient, obtaining history from patient or surrogate, ordering and performing treatments and interventions, ordering and review of laboratory studies and ordering and review of radiographic studies     Medications Ordered in ED Medications  azithromycin (ZITHROMAX) 500 mg in sodium chloride 0.9 % 250 mL IVPB (500 mg Intravenous New Bag/Given 07/11/22 1520)   sodium chloride 0.9 % bolus 1,000 mL (1,000 mLs Intravenous New Bag/Given 07/11/22 1438)  cefTRIAXone (ROCEPHIN) 2 g in sodium chloride 0.9 % 100 mL IVPB (0 g Intravenous Stopped 07/11/22 1516)    ED Course/ Medical Decision Making/ A&P Clinical Course as of 07/11/22 1529  Thu Jul 11, 2022  1512 Handoff: Stable 77 year old female presenting with a chief complaint of shortness of breath.  Recent admission for TIA observation today with cough fever malaise fatigue. Being treated for community-acquired pneumonia per prior provider.  White count elevated, recommended for admission per prior provider but patient has deferred at this time.  She is wanting to see if her symptoms improve enough to go home with antibiotics given her recent admission. Will reassess. [CC]    Clinical Course User Index [CC] Glyn Ade, MD                             Medical Decision Making ZOELLE VIELMA is a 77 y.o. female.  With PMH of CAD, HTN, HLD, GERD, recent admission in April for TIA who presents with increasing dyspnea on exertion as well as generally feeling unwell.    Based on patient's history and presentation, she does present tachycardic and mildly tachypneic meeting SIRS criteria with infectious symptoms that sound most consistent with viral URI or atypical pneumonia.  Her abdominal exam is benign with no vomiting.  Doubt intra-abdominal source.  Started sepsis workup which I personally reviewed patient's chest x-ray with bilateral interstitial opacities suggestive of pulmonary congestion or atypical infection.  I am favoring atypical infection based off patient's complaints as well as her leukocytosis 15.1 with left shift.  I have started Rocephin and azithromycin.  Her creatinine is slightly elevated 1.05.  No acute electrolyte abnormalities.  UA not consistent with UTI.  VBG with respiratory alkalosis VBG pH 7.51 pCO2 0.4.  Signed out patient to oncoming provider Dr. Doran Durand pending BNP,  lactate and troponin and reassessment.  Likely admission for continued antibiotics however dependent on reassessment and labs.q  Amount and/or Complexity of Data Reviewed Labs: ordered. Radiology: ordered.      Final Clinical Impression(s) / ED Diagnoses Final diagnoses:  Dyspnea on exertion  Multifocal pneumonia    Rx / DC Orders ED Discharge Orders     None  Mardene Sayer, MD 07/11/22 [phone]

## 2022-07-13 DIAGNOSIS — R1013 Epigastric pain: Secondary | ICD-10-CM | POA: Diagnosis not present

## 2022-07-13 DIAGNOSIS — G4733 Obstructive sleep apnea (adult) (pediatric): Secondary | ICD-10-CM | POA: Diagnosis not present

## 2022-07-13 DIAGNOSIS — Z833 Family history of diabetes mellitus: Secondary | ICD-10-CM | POA: Diagnosis not present

## 2022-07-13 DIAGNOSIS — I700 Atherosclerosis of aorta: Secondary | ICD-10-CM | POA: Diagnosis not present

## 2022-07-13 DIAGNOSIS — Z7952 Long term (current) use of systemic steroids: Secondary | ICD-10-CM | POA: Diagnosis not present

## 2022-07-13 DIAGNOSIS — R000 Tachycardia, unspecified: Secondary | ICD-10-CM | POA: Diagnosis not present

## 2022-07-13 DIAGNOSIS — Z7951 Long term (current) use of inhaled steroids: Secondary | ICD-10-CM | POA: Diagnosis not present

## 2022-07-13 DIAGNOSIS — R079 Chest pain, unspecified: Secondary | ICD-10-CM | POA: Diagnosis not present

## 2022-07-13 DIAGNOSIS — J9601 Acute respiratory failure with hypoxia: Secondary | ICD-10-CM | POA: Diagnosis not present

## 2022-07-13 DIAGNOSIS — Z86711 Personal history of pulmonary embolism: Secondary | ICD-10-CM | POA: Diagnosis not present

## 2022-07-13 DIAGNOSIS — R918 Other nonspecific abnormal finding of lung field: Secondary | ICD-10-CM | POA: Diagnosis not present

## 2022-07-13 DIAGNOSIS — I2699 Other pulmonary embolism without acute cor pulmonale: Secondary | ICD-10-CM | POA: Diagnosis not present

## 2022-07-13 DIAGNOSIS — I517 Cardiomegaly: Secondary | ICD-10-CM | POA: Diagnosis not present

## 2022-07-13 DIAGNOSIS — R231 Pallor: Secondary | ICD-10-CM | POA: Diagnosis not present

## 2022-07-13 DIAGNOSIS — R600 Localized edema: Secondary | ICD-10-CM | POA: Diagnosis not present

## 2022-07-13 DIAGNOSIS — I10 Essential (primary) hypertension: Secondary | ICD-10-CM | POA: Diagnosis not present

## 2022-07-13 DIAGNOSIS — Z8679 Personal history of other diseases of the circulatory system: Secondary | ICD-10-CM | POA: Diagnosis not present

## 2022-07-13 DIAGNOSIS — Z87891 Personal history of nicotine dependence: Secondary | ICD-10-CM | POA: Diagnosis not present

## 2022-07-13 DIAGNOSIS — Z882 Allergy status to sulfonamides status: Secondary | ICD-10-CM | POA: Diagnosis not present

## 2022-07-13 DIAGNOSIS — Z8249 Family history of ischemic heart disease and other diseases of the circulatory system: Secondary | ICD-10-CM | POA: Diagnosis not present

## 2022-07-13 DIAGNOSIS — I771 Stricture of artery: Secondary | ICD-10-CM | POA: Diagnosis not present

## 2022-07-13 DIAGNOSIS — E785 Hyperlipidemia, unspecified: Secondary | ICD-10-CM | POA: Diagnosis not present

## 2022-07-13 DIAGNOSIS — K219 Gastro-esophageal reflux disease without esophagitis: Secondary | ICD-10-CM | POA: Diagnosis not present

## 2022-07-13 DIAGNOSIS — Z79899 Other long term (current) drug therapy: Secondary | ICD-10-CM | POA: Diagnosis not present

## 2022-07-13 DIAGNOSIS — I361 Nonrheumatic tricuspid (valve) insufficiency: Secondary | ICD-10-CM | POA: Diagnosis not present

## 2022-07-13 DIAGNOSIS — Z7982 Long term (current) use of aspirin: Secondary | ICD-10-CM | POA: Diagnosis not present

## 2022-07-13 DIAGNOSIS — R55 Syncope and collapse: Secondary | ICD-10-CM | POA: Diagnosis not present

## 2022-07-13 DIAGNOSIS — Z809 Family history of malignant neoplasm, unspecified: Secondary | ICD-10-CM | POA: Diagnosis not present

## 2022-07-13 DIAGNOSIS — Z8616 Personal history of COVID-19: Secondary | ICD-10-CM | POA: Diagnosis not present

## 2022-07-13 LAB — CULTURE, BLOOD (ROUTINE X 2): Special Requests: ADEQUATE

## 2022-07-14 DIAGNOSIS — I10 Essential (primary) hypertension: Secondary | ICD-10-CM | POA: Diagnosis not present

## 2022-07-14 DIAGNOSIS — G4733 Obstructive sleep apnea (adult) (pediatric): Secondary | ICD-10-CM | POA: Diagnosis not present

## 2022-07-14 DIAGNOSIS — I2699 Other pulmonary embolism without acute cor pulmonale: Secondary | ICD-10-CM | POA: Diagnosis not present

## 2022-07-14 DIAGNOSIS — I517 Cardiomegaly: Secondary | ICD-10-CM | POA: Diagnosis not present

## 2022-07-14 DIAGNOSIS — Z87891 Personal history of nicotine dependence: Secondary | ICD-10-CM | POA: Diagnosis not present

## 2022-07-14 DIAGNOSIS — I361 Nonrheumatic tricuspid (valve) insufficiency: Secondary | ICD-10-CM | POA: Diagnosis not present

## 2022-07-14 DIAGNOSIS — Z7982 Long term (current) use of aspirin: Secondary | ICD-10-CM | POA: Diagnosis not present

## 2022-07-14 DIAGNOSIS — K219 Gastro-esophageal reflux disease without esophagitis: Secondary | ICD-10-CM | POA: Diagnosis not present

## 2022-07-14 DIAGNOSIS — T8089XA Other complications following infusion, transfusion and therapeutic injection, initial encounter: Secondary | ICD-10-CM | POA: Diagnosis not present

## 2022-07-14 DIAGNOSIS — J9601 Acute respiratory failure with hypoxia: Secondary | ICD-10-CM | POA: Diagnosis not present

## 2022-07-14 DIAGNOSIS — Z8616 Personal history of COVID-19: Secondary | ICD-10-CM | POA: Diagnosis not present

## 2022-07-14 DIAGNOSIS — I110 Hypertensive heart disease with heart failure: Secondary | ICD-10-CM | POA: Diagnosis not present

## 2022-07-14 DIAGNOSIS — G43909 Migraine, unspecified, not intractable, without status migrainosus: Secondary | ICD-10-CM | POA: Diagnosis not present

## 2022-07-14 DIAGNOSIS — Z86010 Personal history of colonic polyps: Secondary | ICD-10-CM | POA: Diagnosis not present

## 2022-07-14 DIAGNOSIS — Z91013 Allergy to seafood: Secondary | ICD-10-CM | POA: Diagnosis not present

## 2022-07-14 DIAGNOSIS — R600 Localized edema: Secondary | ICD-10-CM | POA: Diagnosis not present

## 2022-07-14 DIAGNOSIS — R7989 Other specified abnormal findings of blood chemistry: Secondary | ICD-10-CM | POA: Diagnosis not present

## 2022-07-14 DIAGNOSIS — D688 Other specified coagulation defects: Secondary | ICD-10-CM | POA: Diagnosis not present

## 2022-07-14 DIAGNOSIS — I50810 Right heart failure, unspecified: Secondary | ICD-10-CM | POA: Diagnosis not present

## 2022-07-14 DIAGNOSIS — R55 Syncope and collapse: Secondary | ICD-10-CM | POA: Diagnosis not present

## 2022-07-14 DIAGNOSIS — Z9981 Dependence on supplemental oxygen: Secondary | ICD-10-CM | POA: Diagnosis not present

## 2022-07-14 DIAGNOSIS — M316 Other giant cell arteritis: Secondary | ICD-10-CM | POA: Diagnosis not present

## 2022-07-14 DIAGNOSIS — S5012XA Contusion of left forearm, initial encounter: Secondary | ICD-10-CM | POA: Diagnosis not present

## 2022-07-14 DIAGNOSIS — Z79899 Other long term (current) drug therapy: Secondary | ICD-10-CM | POA: Diagnosis not present

## 2022-07-14 DIAGNOSIS — E785 Hyperlipidemia, unspecified: Secondary | ICD-10-CM | POA: Diagnosis not present

## 2022-07-14 DIAGNOSIS — Z882 Allergy status to sulfonamides status: Secondary | ICD-10-CM | POA: Diagnosis not present

## 2022-07-14 LAB — CULTURE, BLOOD (ROUTINE X 2)
Culture: NO GROWTH
Culture: NO GROWTH

## 2022-07-15 DIAGNOSIS — J9601 Acute respiratory failure with hypoxia: Secondary | ICD-10-CM | POA: Diagnosis not present

## 2022-07-15 LAB — CULTURE, BLOOD (ROUTINE X 2)

## 2022-07-16 DIAGNOSIS — J9601 Acute respiratory failure with hypoxia: Secondary | ICD-10-CM | POA: Diagnosis not present

## 2022-07-16 LAB — CULTURE, BLOOD (ROUTINE X 2): Special Requests: ADEQUATE

## 2022-07-17 DIAGNOSIS — J9601 Acute respiratory failure with hypoxia: Secondary | ICD-10-CM | POA: Diagnosis not present

## 2022-07-21 ENCOUNTER — Other Ambulatory Visit: Payer: Self-pay | Admitting: Family Medicine

## 2022-07-21 DIAGNOSIS — E876 Hypokalemia: Secondary | ICD-10-CM

## 2022-07-23 ENCOUNTER — Encounter: Payer: Self-pay | Admitting: Family Medicine

## 2022-07-23 ENCOUNTER — Ambulatory Visit (INDEPENDENT_AMBULATORY_CARE_PROVIDER_SITE_OTHER): Payer: Medicare Other | Admitting: Family Medicine

## 2022-07-23 VITALS — BP 130/70 | HR 114 | Temp 98.8°F | Resp 12 | Ht 64.0 in | Wt 228.6 lb

## 2022-07-23 DIAGNOSIS — I10 Essential (primary) hypertension: Secondary | ICD-10-CM | POA: Diagnosis not present

## 2022-07-23 DIAGNOSIS — I2694 Multiple subsegmental pulmonary emboli without acute cor pulmonale: Secondary | ICD-10-CM

## 2022-07-23 MED ORDER — CARVEDILOL 6.25 MG PO TABS
6.2500 mg | ORAL_TABLET | Freq: Two times a day (BID) | ORAL | 3 refills | Status: DC
Start: 2022-07-23 — End: 2022-10-14

## 2022-07-23 NOTE — Assessment & Plan Note (Signed)
Assessment and Plan    Tachycardia: Persistent high heart rate despite antihypertensive therapy. Patient reports feeling tired and weak. Cardiology consultation scheduled. -Start Carvedilol (beta-blocker) at second to lowest dose to lower blood pressure and slow heart rate. -Re-evaluate in two weeks with Dr. Dallas Schimke for potential dose adjustment.  Anticoagulation: History of blood clots, currently on Eliquis. Patient reports bruising easily. -Continue Eliquis as directed. -Advise patient to use warm compresses on bruises to help reabsorb.  Follow-up: Patient to schedule appointment with Dr. Dallas Schimke in two weeks.

## 2022-07-23 NOTE — Assessment & Plan Note (Signed)
On lifelong eliquis 

## 2022-07-23 NOTE — Progress Notes (Signed)
Established Patient Office Visit  Subjective   Patient ID: Stephanie Parks, female    DOB: 1946-01-23  Age: 77 y.o. MRN: [id number]  Chief Complaint  Patient presents with   Hypertension    146/81- was taken off spironolactone    HPI Discussed the use of AI scribe software for clinical note transcription with the patient, who gave verbal consent to proceed.  History of Present Illness   The patient, with a history of hypertension, presents with a persistently high heart rate and blood pressure. She reports feeling 'tired' and 'weak,' and has been experiencing headaches. She was previously on losartan and spironolactone, but was advised to stop these medications during a recent hospital stay. The reason for discontinuation was not explained to her. She has an upcoming appointment with a cardiologist.  The patient also has a history of blood clots, which she experienced during a bout of COVID-19 in 2022. She is currently on Eliquis for this condition. She reports bruising easily, which is a known side effect of Eliquis. She was also recently on prednisone for three weeks following a negative biopsy for giant cell arteritis.      Patient Active Problem List   Diagnosis Date Noted   Multiple subsegmental pulmonary emboli without acute cor pulmonale (HCC) 07/23/2022   TIA (transient ischemic attack) 06/08/2022   Diastolic dysfunction 06/08/2022   Obesity (BMI 30-39.9) 06/08/2022   OSA (obstructive sleep apnea) 06/08/2022   Unilateral primary osteoarthritis, right hip 05/29/2022   Status post total replacement of left hip 09/04/2021   DJD (degenerative joint disease) 09/04/2021   Iron deficiency anemia 03/13/2021   Erythropoietin deficiency anemia 03/13/2021   Unilateral primary osteoarthritis, left hip 10/18/2020   Greater trochanteric bursitis of left hip 10/18/2020   Headache 06/07/2020   Acute respiratory failure with hypoxia (HCC) 05/02/2020   Post-COVID chronic dyspnea  05/02/2020   Pulsatile neck mass 04/26/2020   Pneumonia due to COVID-19 virus 03/12/2020   Headache disorder 01/04/2020   Seasonal allergic conjunctivitis 10/04/2019   Dysfunction of right eustachian tube 10/04/2019   Pre-diabetes 06/02/2019   Seasonal and perennial allergic rhinitis 12/22/2018   Heart palpitations 12/22/2018   History of chest pain 07/20/2018   Brain aneurysm 07/20/2018   Dyslipidemia 05/04/2018   Tendonitis, Achilles, right 12/08/2017   Unilateral primary osteoarthritis, left knee 10/07/2017   Ingrown toenail 08/10/2017   Coughing 04/24/2017   CAD (coronary artery disease) 03/01/2015   Aneurysm (HCC) 03/01/2015   Dizziness 03/01/2015   Hypokalemia 03/01/2015   Paresthesia of arm 03/01/2015   Achalasia 09/28/2012   CN (constipation) 09/28/2012   Cephalalgia 08/24/2012   Essential hypertension 04/19/2011   Hyperlipidemia 04/19/2011   GERD (gastroesophageal reflux disease) 04/19/2011   Chest pain 04/04/2011   Past Medical History:  Diagnosis Date   Achalasia 09/28/2012   Allergic rhinitis 03/01/2015   Anemia    Aneurysm (HCC) 03/01/2015   stable 5 mm periopthalmic right ICA aneurysm 03/25/21 CTA   BP (high blood pressure) 03/01/2015   Bronchitis 04/23/2018   Cephalalgia 08/24/2012   Overview:  ICD-10 cut over     Chest pain 04/04/2011   Coronary Ca Score 0, no significant CAD 08/20/18; CP with troponin 4>32 s/p LHC showing normal coronaries 12/29/18   CN (constipation) 09/28/2012   Coughing 04/24/2017   Decreased potassium in the blood 03/01/2015   Diverticulosis    Dizziness 03/01/2015   Erythropoietin deficiency anemia 03/13/2021   GERD (gastroesophageal reflux disease)    Hiatal hernia  Hypercholesterolemia 03/01/2015   Hyperlipidemia    Hypertension    Ingrown toenail 08/10/2017   Inguinal hernia    Iron deficiency anemia 03/13/2021   Migraine headache    Onychomycosis 12/08/2017   Paresthesia of arm 03/01/2015   PE (pulmonary  thromboembolism) (HCC) 03/13/2020   in setting of + COVID-19, s/p Eliquis x 3 month   Schatzki's ring    Tendonitis, Achilles, right 12/08/2017   Unilateral primary osteoarthritis, left knee 10/07/2017   Past Surgical History:  Procedure Laterality Date   ABDOMINAL HYSTERECTOMY     BACK SURGERY     x 3   CARDIAC CATHETERIZATION  12/29/2018   angiographically normal coronary arteries 12/29/18 ([place])   CARPAL TUNNEL RELEASE     left wrist   CERVICAL SPINE SURGERY     HEMORRHOID SURGERY     TOTAL HIP ARTHROPLASTY Left 09/04/2021   Procedure: LEFT TOTAL HIP ARTHROPLASTY ANTERIOR APPROACH;  Surgeon: Kathryne Hitch, MD;  Location: [facility] OR;  Service: Orthopedics;  Laterality: Left;   Social History   Tobacco Use   Smoking status: Former    Packs/day: 0.50    Years: 20.00    Additional pack years: 0.00    Total pack years: 10.00    Types: Cigarettes    Quit date: 02/19/1984    Years since quitting: 38.4   Smokeless tobacco: Never  Vaping Use   Vaping Use: Never used  Substance Use Topics   Alcohol use: No   Drug use: No   Social History   Socioeconomic History   Marital status: Divorced    Spouse name: Not on file   Number of children: 2   Years of education: Not on file   Highest education level: Some college, no degree  Occupational History   Occupation: Lobbyist: [id number]  Tobacco Use   Smoking status: Former    Packs/day: 0.50    Years: 20.00    Additional pack years: 0.00    Total pack years: 10.00    Types: Cigarettes    Quit date: 02/19/1984    Years since quitting: 38.4   Smokeless tobacco: Never  Vaping Use   Vaping Use: Never used  Substance and Sexual Activity   Alcohol use: No   Drug use: No   Sexual activity: Yes    Partners: Male  Other Topics Concern   Not on file  Social History Narrative   Lives alone   Caffeine use: Coffee one cup daily   Right handed    Son is her next of kin   Working at [employer]   Financial Resource Strain: Medium Risk (06/27/2022)   Overall Financial Resource Strain (CARDIA)    Difficulty of Paying Living Expenses: Somewhat hard  Food Insecurity: Food Insecurity Present (06/27/2022)   Hunger Vital Sign    Worried About Running Out of Food in the Last Year: Sometimes true    Ran Out of Food in the Last Year: Sometimes true  Transportation Needs: No Transportation Needs (06/27/2022)   PRAPARE - Administrator, Civil Service (Medical): No    Lack of Transportation (Non-Medical): No  Physical Activity: Inactive (01/21/2021)   Exercise Vital Sign    Days of Exercise per Week: 0 days    Minutes of Exercise per Session: 0 min  Stress: No Stress Concern Present (06/27/2022)   [employer] of Occupational Health - Occupational Stress Questionnaire    Feeling of  Stress : Not at all  Social Connections: Moderately Integrated (06/27/2022)   Social Connection and Isolation Panel [NHANES]    Frequency of Communication with Friends and Family: Three times a week    Frequency of Social Gatherings with Friends and Family: Once a week    Attends Religious Services: 1 to 4 times per year    Active Member of [employer] or Organizations: Yes    Attends Banker Meetings: 1 to 4 times per year    Marital Status: Divorced  Catering manager Violence: Not At Risk (03/20/2022)   Humiliation, Afraid, Rape, and Kick questionnaire    Fear of Current or Ex-Partner: No    Emotionally Abused: No    Physically Abused: No    Sexually Abused: No   Family Status  Relation Name Status   Sister  Alive   Father  Deceased   MGM  (Not Specified)   Mother  Deceased   Neg Hx  (Not Specified)   Family History  Problem Relation Age of Onset   Allergic rhinitis Sister    Diabetes Sister    Hypertension Sister    Heart attack Father 65   Heart disease Father    Stomach cancer Maternal Grandmother    Heart disease Mother    Colon cancer Neg Hx     Angioedema Neg Hx    Asthma Neg Hx    Eczema Neg Hx    Urticaria Neg Hx    Immunodeficiency Neg Hx    Breast cancer Neg Hx    Migraines Neg Hx    Headache Neg Hx    Allergies  Allergen Reactions   Shellfish Allergy Hives and Swelling   Ace Inhibitors Other (See Comments)    Pt cannot recall her reaction but tolerates arb    Gabapentin Other (See Comments)    headaches   Pitavastatin     Unknown reaction  Other Reaction(s): Other (See Comments)  ADVERSE REACTION,  Pt unaware of this reaction; states she has been on crestor in the past with no issues   Sulfa Antibiotics Other (See Comments) and Rash    Fine bumps   Topiramate Palpitations    Heart race,       ROS    Objective:     BP 130/70 (BP Location: Left Arm, Cuff Size: Large)   Pulse (!) 114   Temp 98.8 F (37.1 C) (Oral)   Resp 12   Ht 5\' 4"  (1.626 m)   Wt 228 lb 9.6 oz (103.7 kg)   SpO2 98%   BMI 39.24 kg/m  BP Readings from Last 3 Encounters:  07/23/22 130/70  07/11/22 (!) 146/78  06/09/22 125/77   Wt Readings from Last 3 Encounters:  07/23/22 228 lb 9.6 oz (103.7 kg)  07/11/22 233 lb (105.7 kg)  06/08/22 221 lb 9 oz (100.5 kg)   SpO2 Readings from Last 3 Encounters:  07/23/22 98%  07/11/22 98%  06/09/22 96%      Physical Exam   No results found for any visits on 07/23/22.  Last CBC Lab Results  Component Value Date   WBC 15.1 (H) 07/11/2022   HGB 8.2 (L) 07/11/2022   HCT 24.0 (L) 07/11/2022   MCV 92.5 07/11/2022   MCH 30.0 07/11/2022   RDW 17.0 (H) 07/11/2022   PLT 255 07/11/2022   Last metabolic panel Lab Results  Component Value Date   GLUCOSE 111 (H) 07/11/2022   NA 144 07/11/2022   K 3.5 07/11/2022  CL 100 07/11/2022   CO2 27 07/11/2022   BUN 21 07/11/2022   CREATININE 1.05 (H) 07/11/2022   GFRNONAA 55 (L) 07/11/2022   CALCIUM 9.4 07/11/2022   PHOS 3.3 03/21/2020   PROT 7.1 07/11/2022   ALBUMIN 3.7 07/11/2022   BILITOT 0.5 07/11/2022   ALKPHOS 63 07/11/2022    AST 20 07/11/2022   ALT 19 07/11/2022   ANIONGAP 11 07/11/2022   Last lipids Lab Results  Component Value Date   CHOL 180 06/08/2022   HDL 67 06/08/2022   LDLCALC 101 (H) 06/08/2022   TRIG 62 06/08/2022   CHOLHDL 2.7 06/08/2022   Last hemoglobin A1c Lab Results  Component Value Date   HGBA1C 6.1 (H) 06/08/2022   Last thyroid functions Lab Results  Component Value Date   TSH 3.771 06/09/2022   Last vitamin D Lab Results  Component Value Date   VD25OH 36.70 09/26/2016   Last vitamin B12 and Folate Lab Results  Component Value Date   VITAMINB12 297 10/03/2021   FOLATE >24.2 10/03/2021      The 10-year ASCVD risk score (Arnett DK, et al., 2019) is: 19.7%    Assessment & Plan:   Problem List Items Addressed This Visit       Unprioritized   Multiple subsegmental pulmonary emboli without acute cor pulmonale (HCC)    On lifelong eliquis       Relevant Medications   carvedilol (COREG) 6.25 MG tablet   Essential hypertension - Primary    Assessment and Plan    Tachycardia: Persistent high heart rate despite antihypertensive therapy. Patient reports feeling tired and weak. Cardiology consultation scheduled. -Start Carvedilol (beta-blocker) at second to lowest dose to lower blood pressure and slow heart rate. -Re-evaluate in two weeks with Dr. Dallas Schimke for potential dose adjustment.  Anticoagulation: History of blood clots, currently on Eliquis. Patient reports bruising easily. -Continue Eliquis as directed. -Advise patient to use warm compresses on bruises to help reabsorb.  Follow-up: Patient to schedule appointment with Dr. Dallas Schimke in two weeks.            Relevant Medications   carvedilol (COREG) 6.25 MG tablet    Return in about 2 weeks (around 08/06/2022), or if symptoms worsen or fail to improve, for hypertension.    Donato Schultz, DO

## 2022-07-23 NOTE — Patient Instructions (Signed)
Sinus Tachycardia  Sinus tachycardia is a fast heartbeat. In sinus tachycardia, the heart beats more than 100 times a minute. Sinus tachycardia starts in the part of the heart called the sinoatrial (SA) node. Sinus tachycardia may be harmless, or it may be a sign of a serious condition. What are the causes? This condition may be caused by: Exercise or exertion. A fever. Pain. Loss of body fluids (dehydration). Severe bleeding (hemorrhage). Anxiety and stress. Certain substances, including: Alcohol. Caffeine. Tobacco and nicotine products. Cold medicines. Illegal drugs. Medical conditions including: Heart disease. An infection. An overactive thyroid (hyperthyroidism). A lack of red blood cells (anemia). What are the signs or symptoms? Symptoms of this condition include: A feeling that the heart is beating fast or unevenly (palpitations). Suddenly noticing your heartbeat (cardiac awareness). Lightheadedness. Tiredness (fatigue). Shortness of breath. Chest pain. Nausea. Fainting. How is this diagnosed? This condition is diagnosed with: A physical exam. Tests or monitoring, such as: Blood tests. An electrocardiogram (ECG). This test measures the electrical activity of the heart. Ambulatory cardiac monitor. This records your heartbeats for 24 hours or more. You may be referred to a heart specialist (cardiologist). How is this treated? Treatment for this condition depends on the cause. Treatment may involve: Treating the underlying condition. Taking new medicines or changing your current medicines as told by your health care provider. Making changes to your diet or lifestyle. Follow these instructions at home: Lifestyle  Do not use any products that contain nicotine or tobacco. These products include cigarettes, chewing tobacco, and vaping devices, such as e-cigarettes. If you need help quitting, ask your health care provider. Do not use illegal drugs, such as  cocaine. Learn relaxation methods to help you when you get stressed or anxious. These include deep breathing. Avoid caffeine or other stimulants, including herbal stimulants that are found in energy drinks. Alcohol use  Do not drink alcohol if: Your health care provider tells you not to drink. You are pregnant, may be pregnant, or are planning to become pregnant. If you drink alcohol: Limit how much you have to: 0-1 drink a day for women. 0-2 drinks a day for men. Know how much alcohol is in your drink. In the [place] one drink equals one 12 oz bottle of beer (355 mL), one 5 oz glass of wine (148 mL), or one 1 oz glass of hard liquor (44 mL). General instructions Drink enough fluids to keep your urine pale yellow. Take over-the-counter and prescription medicines only as told by your health care provider. Ask your health care provider about taking vitamins, herbs, and supplements. Contact a health care provider if: You have vomiting or diarrhea that does not go away. You have a fever. You have weakness or dizziness. You feel faint. Get help right away if: You have pain in your chest, upper arms, jaw, or neck. You have palpitations that do not go away. Summary In sinus tachycardia, the heart beats more than 100 times a minute. Sinus tachycardia may be harmless, or it may be a sign of a serious condition. Treatment for this condition depends on the cause or the underlying condition. Get help right away if you have pain in your chest, upper arms, jaw, or neck. This information is not intended to replace advice given to you by your health care provider. Make sure you discuss any questions you have with your health care provider. Document Revised: 06/05/2021 Document Reviewed: 06/05/2021 Elsevier Patient Education  2024 [employer]  

## 2022-07-23 NOTE — Telephone Encounter (Signed)
Pts HFU is scheduled for 6/10. Should she come in sooner?

## 2022-07-23 NOTE — Telephone Encounter (Signed)
Pt was scheduled with you at 6 pm today for "Elevated BP readings" She will confirm via MyChart with me once she confirms her ride.

## 2022-07-25 DIAGNOSIS — R0600 Dyspnea, unspecified: Secondary | ICD-10-CM | POA: Diagnosis not present

## 2022-07-25 DIAGNOSIS — I824Y9 Acute embolism and thrombosis of unspecified deep veins of unspecified proximal lower extremity: Secondary | ICD-10-CM | POA: Diagnosis not present

## 2022-07-25 DIAGNOSIS — I10 Essential (primary) hypertension: Secondary | ICD-10-CM | POA: Diagnosis not present

## 2022-07-25 DIAGNOSIS — I2699 Other pulmonary embolism without acute cor pulmonale: Secondary | ICD-10-CM | POA: Diagnosis not present

## 2022-07-25 DIAGNOSIS — R002 Palpitations: Secondary | ICD-10-CM | POA: Diagnosis not present

## 2022-07-29 ENCOUNTER — Inpatient Hospital Stay: Payer: Medicare Other | Admitting: Family Medicine

## 2022-07-30 ENCOUNTER — Encounter: Payer: Self-pay | Admitting: Hematology & Oncology

## 2022-07-31 ENCOUNTER — Ambulatory Visit: Payer: Medicare Other | Admitting: Internal Medicine

## 2022-07-31 ENCOUNTER — Encounter: Payer: Self-pay | Admitting: Internal Medicine

## 2022-07-31 VITALS — BP 128/68 | HR 90 | Temp 97.7°F | Resp 16 | Wt 234.5 lb

## 2022-07-31 DIAGNOSIS — H1013 Acute atopic conjunctivitis, bilateral: Secondary | ICD-10-CM

## 2022-07-31 DIAGNOSIS — H1010 Acute atopic conjunctivitis, unspecified eye: Secondary | ICD-10-CM

## 2022-07-31 DIAGNOSIS — J309 Allergic rhinitis, unspecified: Secondary | ICD-10-CM | POA: Diagnosis not present

## 2022-07-31 DIAGNOSIS — I2609 Other pulmonary embolism with acute cor pulmonale: Secondary | ICD-10-CM | POA: Diagnosis not present

## 2022-07-31 DIAGNOSIS — J302 Other seasonal allergic rhinitis: Secondary | ICD-10-CM

## 2022-07-31 NOTE — Progress Notes (Signed)
FOLLOW UP Date of Service/Encounter:  07/31/22   Subjective:  Stephanie Parks (DOB: Nov 09, 1945) is a 77 y.o. female who [facility] in re-evaluation of the following: for allergic rhinosinusitis, allergic conjunctivitis, and eustachian tube dysfunction. History obtained from: chart review and patient.  For Review, LV was on 11/16/21 with Dr.Dennis seen for routine follow-up.  Since last visit she was hospitalized from 5/26-5/30/24 for pulmonary embolism with cor pulmonale and respiratory failure. This is her second PE (first in JAN 22 after COVID infection and was on anticoagulation for 6 months).  F/U with cardiology on 07/25/22.  To continue Eliquis and repeat echocardiogram in 3 months.  As well repeat 2-week Holter monitor given palpitations.  "Hospital Course:  Patient was admitted, IV heparin and oxygen started, cardiology consulted. Echo read as dilated right ventricle, RV function mildly reduced. Cardiology felt that EKOS would be appropriate but apparently the patient had a history of internal carotid artery aneurysm in 2023 which was a contraindication to EKOS. Patient was weaned off oxygen fairly quickly. She did not have any further chest pain or shortness of breath. She did have a left arm hematoma that developed on the night of 5/26 due to repeated IV sticks for PTT and supratherapeutic heparin. This hematoma improved spontaneously. Patient was transitioned from heparin to PO Eliquis and was discharged to home in stable condition on 07/17/22 with close follow up by PCP in 3-5 days and cardiology as outpatient. She did have soft blood pressures so spironolactone and losartan held on discharge. Her Cr went up to 1.22. Patient will need repeat BMP and CBC on follow up with her PCP. "  She is on AIT receiving 1 vial containing molds, cockroach and weed.  She is currently in the red vial at maintenance dose coming every 2 to 4 weeks.  Per review of  her epic chart, she has been in red vial since at least 2017.  Today presents for follow-up. Reports dyspnea from PE is slowly improving.  Still has mild cough  Palpitations have not returned.  She last got Ait 0.25ml of Red vial on 06/04/22. She would like to resume injections but was told she needed physician clearance given delay and hospitalization.  Denies and LR/SR Feels like congestion has worsened since holding AIT.  She is not on any medications for rhinitis and has been able to stop all of then with AIT.   Previously complained of dysphagia-like symptoms, but states those have resolved and not returned.  She takes omeprazole 40 mg daily, and feels her reflux is controlled.  She does follow with gastroenterology, but hasn't been seen recently. Plans to follow up once she finishes cardiac work up.       Allergies as of 07/31/2022       Reactions   Shellfish Allergy Hives, Swelling   Ace Inhibitors Other (See Comments)   Pt cannot recall her reaction but tolerates arb    Gabapentin Other (See Comments)   headaches   Pitavastatin    Unknown reaction Other Reaction(s): Other (See Comments) ADVERSE REACTION,  Pt unaware of this reaction; states she has been on crestor in the past with no issues   Sulfa Antibiotics Other (See Comments), Rash   Fine bumps   Topiramate Palpitations   Heart race,         Medication List        Accurate as of July 31, 2022  1:21 PM. If you  have any questions, ask your nurse or doctor.          ALIVE MULTI-VITAMIN PO Take 1 tablet by mouth daily.   aspirin EC 81 MG tablet Take 1 tablet (81 mg total) by mouth daily. Swallow whole.   B-12 PO Take 1 tablet by mouth daily.   CALCIUM 600 + D PO Take 1 tablet by mouth daily.   carboxymethylcellulose 0.5 % Soln Commonly known as: REFRESH PLUS Place 1 drop into both eyes 3 (three) times daily as needed (dry eyes).   carvedilol 6.25 MG tablet Commonly known as: COREG Take 1 tablet  (6.25 mg total) by mouth 2 (two) times daily with a meal.   ELDERBERRY PO Take 1 capsule by mouth daily.   EPINEPHrine 0.3 mg/0.3 mL Soaj injection Commonly known as: EpiPen 2-Pak Inject 0.3 mg into the muscle as needed for anaphylaxis. Per allergen immunotherapy protocol   fluticasone 50 MCG/ACT nasal spray Commonly known as: FLONASE Place 2 sprays into both nostrils daily as needed for allergies.   Klor-Con M20 20 MEQ tablet Generic drug: potassium chloride SA TAKE 2 TABLETS BY MOUTH EVERY DAY   NON FORMULARY Pt uses a cpap nightly   nortriptyline 25 MG capsule Commonly known as: PAMELOR Take 2 capsules (50 mg total) by mouth at bedtime.   polyethylene glycol 17 g packet Commonly known as: MiraLax Take 17 g by mouth daily as needed for mild constipation or moderate constipation.   rosuvastatin 20 MG tablet Commonly known as: CRESTOR Take 1 tablet (20 mg total) by mouth daily.       Past Medical History:  Diagnosis Date   Achalasia 09/28/2012   Allergic rhinitis 03/01/2015   Anemia    Aneurysm (HCC) 03/01/2015   stable 5 mm periopthalmic right ICA aneurysm 03/25/21 CTA   BP (high blood pressure) 03/01/2015   Bronchitis 04/23/2018   Cephalalgia 08/24/2012   Overview:  ICD-10 cut over     Chest pain 04/04/2011   Coronary Ca Score 0, no significant CAD 08/20/18; CP with troponin 4>32 s/p LHC showing normal coronaries 12/29/18   CN (constipation) 09/28/2012   Coughing 04/24/2017   Decreased potassium in the blood 03/01/2015   Diverticulosis    Dizziness 03/01/2015   Erythropoietin deficiency anemia 03/13/2021   GERD (gastroesophageal reflux disease)    Hiatal hernia    Hypercholesterolemia 03/01/2015   Hyperlipidemia    Hypertension    Ingrown toenail 08/10/2017   Inguinal hernia    Iron deficiency anemia 03/13/2021   Migraine headache    Onychomycosis 12/08/2017   Paresthesia of arm 03/01/2015   PE (pulmonary thromboembolism) (HCC) 03/13/2020   in setting  of + COVID-19, s/p Eliquis x 3 month   Schatzki's ring    Tendonitis, Achilles, right 12/08/2017   Unilateral primary osteoarthritis, left knee 10/07/2017   Past Surgical History:  Procedure Laterality Date   ABDOMINAL HYSTERECTOMY     BACK SURGERY     x 3   CARDIAC CATHETERIZATION  12/29/2018   angiographically normal coronary arteries 12/29/18 ([place])   CARPAL TUNNEL RELEASE     left wrist   CERVICAL SPINE SURGERY     HEMORRHOID SURGERY     TOTAL HIP ARTHROPLASTY Left 09/04/2021   Procedure: LEFT TOTAL HIP ARTHROPLASTY ANTERIOR APPROACH;  Surgeon: Kathryne Hitch, MD;  Location: [facility] OR;  Service: Orthopedics;  Laterality: Left;   Otherwise, there have been no changes to her past medical history, surgical history, family history, or social history.  ROS: All others negative except as noted per HPI.   Objective:  BP 128/68   Pulse 90   Temp 97.7 F (36.5 C) (Temporal)   Resp 16   Wt 234 lb 8 oz (106.4 kg)   SpO2 98%   BMI 40.25 kg/m  Body mass index is 40.25 kg/m. Physical Exam: General Appearance:  Alert, cooperative, no distress, appears stated age  Head:  Normocephalic, without obvious abnormality, atraumatic  Eyes:  Conjunctiva clear, EOM's intact  Nose: Nares normal, hypertrophic turbinates, normal mucosa, no visible anterior polyps, and septum midline, no rhinnorhea   Throat: Lips, tongue normal; teeth and gums normal, normal posterior oropharynx  Neck: Supple, symmetrical  Lungs:   clear to auscultation bilaterally, Respirations unlabored, no coughing  Heart:  regular rate and rhythm and no murmur, Appears well perfused  Extremities: No edema  Skin: Skin color, texture, turgor normal, no rashes or lesions on visualized portions of skin  Neurologic: No gross deficits     Assessment/Plan    Allergic rhinitis Continue to use flonase 1-2 sprays as needed, best when used consistently Can add non-sedating antihstamine if needed-Your options include:  Zyrtec (cetirizine) 10 mg, Claritin (loratadine) 10 mg, Xyzal (levocetirizine) 5 mg or Allegra (fexofenadine) 180 mg daily as needed Continue allergen avoidance measures directed toward dust mite, cockroach, and weed pollen as listed below Continue allergen immunotherapy and have access to an epinephrine autoinjector set. Injection given today.  Given delayed status patient given 0.93mL of red vial today, she will build up weekly: 0.4, 0.5 and then move back to every 4 weeks interval for maintenance of 0.44mL daily   Allergic conjunctivitis Continue Refresh eyedrops as per your eye doctor  Trouble swallowing Resolved  Continue Omeprazole 40mg  daily    PE  - Continue all medications as prescribed by cardiology and follow up with cardiology and pulmonary as planned.   Follow up: 6 months   Thank you so much for letting me partake in your care today.  Don't hesitate to reach out if you have any additional concerns!  Ferol Luz, MD  [facility]- [place], [place]

## 2022-07-31 NOTE — Patient Instructions (Addendum)
Allergic rhinitis Continue to use flonase 1-2 sprays as needed, best when used consistently Can add non-sedating antihstamine if needed-Your options include: Zyrtec (cetirizine) 10 mg, Claritin (loratadine) 10 mg, Xyzal (levocetirizine) 5 mg or Allegra (fexofenadine) 180 mg daily as needed Continue allergen avoidance measures directed toward dust mite, cockroach, and weed pollen as listed below Continue allergen immunotherapy and have access to an epinephrine autoinjector set. Injection given today.  Given delayed status patient given 0.33mL of red vial today, she will build up weekly: 0.4, 0.5 and then move back to every 4 weeks interval for maintenance of 0.60mL daily   Allergic conjunctivitis Continue Refresh eyedrops as per your eye doctor  Trouble swallowing Resolved  Continue Omeprazole 40mg  daily    PE  - Continue all medications as prescribed by cardiology and follow up with cardiology and pulmonary as planned.   Follow up: 6 months   Thank you so much for letting me partake in your care today.  Don't hesitate to reach out if you have any additional concerns!  Ferol Luz, MD  [facility]- [employer], [place]

## 2022-08-01 ENCOUNTER — Encounter: Payer: Self-pay | Admitting: Hematology & Oncology

## 2022-08-06 ENCOUNTER — Encounter: Payer: Self-pay | Admitting: Family Medicine

## 2022-08-06 ENCOUNTER — Ambulatory Visit: Admit: 2022-08-06 | Payer: Medicare Other | Admitting: Orthopaedic Surgery

## 2022-08-06 SURGERY — ARTHROPLASTY, HIP, TOTAL, ANTERIOR APPROACH
Anesthesia: Spinal | Site: Hip | Laterality: Right

## 2022-08-07 ENCOUNTER — Telehealth (INDEPENDENT_AMBULATORY_CARE_PROVIDER_SITE_OTHER): Payer: Medicare Other | Admitting: Family Medicine

## 2022-08-07 ENCOUNTER — Ambulatory Visit (INDEPENDENT_AMBULATORY_CARE_PROVIDER_SITE_OTHER): Payer: Medicare Other

## 2022-08-07 ENCOUNTER — Other Ambulatory Visit: Payer: Self-pay

## 2022-08-07 ENCOUNTER — Other Ambulatory Visit (INDEPENDENT_AMBULATORY_CARE_PROVIDER_SITE_OTHER): Payer: Medicare Other

## 2022-08-07 ENCOUNTER — Encounter (HOSPITAL_BASED_OUTPATIENT_CLINIC_OR_DEPARTMENT_OTHER): Payer: Self-pay

## 2022-08-07 ENCOUNTER — Emergency Department (HOSPITAL_BASED_OUTPATIENT_CLINIC_OR_DEPARTMENT_OTHER)
Admission: EM | Admit: 2022-08-07 | Discharge: 2022-08-07 | Disposition: A | Discharge status: Home / Self Care | Payer: Medicare Other | Attending: Emergency Medicine | Admitting: Emergency Medicine

## 2022-08-07 ENCOUNTER — Emergency Department (HOSPITAL_BASED_OUTPATIENT_CLINIC_OR_DEPARTMENT_OTHER): Payer: Medicare Other

## 2022-08-07 ENCOUNTER — Encounter: Payer: Self-pay | Admitting: Family Medicine

## 2022-08-07 VITALS — BP 149/86 | HR 86

## 2022-08-07 DIAGNOSIS — R600 Localized edema: Secondary | ICD-10-CM

## 2022-08-07 DIAGNOSIS — R0602 Shortness of breath: Secondary | ICD-10-CM | POA: Insufficient documentation

## 2022-08-07 DIAGNOSIS — D649 Anemia, unspecified: Secondary | ICD-10-CM | POA: Diagnosis not present

## 2022-08-07 DIAGNOSIS — I2699 Other pulmonary embolism without acute cor pulmonale: Secondary | ICD-10-CM | POA: Diagnosis not present

## 2022-08-07 DIAGNOSIS — J309 Allergic rhinitis, unspecified: Secondary | ICD-10-CM | POA: Diagnosis not present

## 2022-08-07 DIAGNOSIS — R079 Chest pain, unspecified: Secondary | ICD-10-CM | POA: Diagnosis not present

## 2022-08-07 DIAGNOSIS — E876 Hypokalemia: Secondary | ICD-10-CM | POA: Insufficient documentation

## 2022-08-07 DIAGNOSIS — Z7982 Long term (current) use of aspirin: Secondary | ICD-10-CM | POA: Diagnosis not present

## 2022-08-07 DIAGNOSIS — G4733 Obstructive sleep apnea (adult) (pediatric): Secondary | ICD-10-CM | POA: Diagnosis not present

## 2022-08-07 LAB — OCCULT BLOOD X 1 CARD TO LAB, STOOL: Fecal Occult Bld: NEGATIVE

## 2022-08-07 LAB — CBC
HCT: 30.9 % — ABNORMAL LOW (ref 35.0–45.0)
HCT: 32 % — ABNORMAL LOW (ref 36.0–46.0)
Hemoglobin: 9.9 g/dL — ABNORMAL LOW (ref 11.7–15.5)
Hemoglobin: 10.1 g/dL — ABNORMAL LOW (ref 12.0–15.0)
MCH: 29 pg (ref 27.0–33.0)
MCH: 29.6 pg (ref 26.0–34.0)
MCHC: 32 g/dL (ref 32.0–36.0)
MCHC: 31.6 g/dL (ref 30.0–36.0)
MCV: 90.6 fL (ref 80.0–100.0)
MCV: 93.8 fL (ref 80.0–100.0)
MPV: 9 fL (ref 7.5–12.5)
Platelets: 343 10*3/uL (ref 140–400)
Platelets: 315 10*3/uL (ref 150–400)
RBC: 3.41 10*6/uL — ABNORMAL LOW (ref 3.80–5.10)
RBC: 3.41 MIL/uL — ABNORMAL LOW (ref 3.87–5.11)
RDW: 14.6 % (ref 11.0–15.0)
RDW: 15.8 % — ABNORMAL HIGH (ref 11.5–15.5)
WBC: 7.2 10*3/uL (ref 3.8–10.8)
WBC: 7 10*3/uL (ref 4.0–10.5)
nRBC: 0 % (ref 0.0–0.2)

## 2022-08-07 LAB — COMPREHENSIVE METABOLIC PANEL
AG Ratio: 1.6 (calc) (ref 1.0–2.5)
ALT: 11 U/L (ref 6–29)
AST: 16 U/L (ref 10–35)
Albumin: 3.7 g/dL (ref 3.6–5.1)
Alkaline phosphatase (APISO): 81 U/L (ref 37–153)
BUN: 12 mg/dL (ref 7–25)
CO2: 30 mmol/L (ref 20–32)
Calcium: 9 mg/dL (ref 8.6–10.4)
Chloride: 105 mmol/L (ref 98–110)
Creat: 0.87 mg/dL (ref 0.60–1.00)
Globulin: 2.3 g/dL (calc) (ref 1.9–3.7)
Glucose, Bld: 114 mg/dL — ABNORMAL HIGH (ref 65–99)
Potassium: 3.8 mmol/L (ref 3.5–5.3)
Sodium: 142 mmol/L (ref 135–146)
Total Bilirubin: 0.3 mg/dL (ref 0.2–1.2)
Total Protein: 6 g/dL — ABNORMAL LOW (ref 6.1–8.1)

## 2022-08-07 LAB — TROPONIN I (HIGH SENSITIVITY)
Troponin I (High Sensitivity): 3 ng/L (ref <18)
Troponin I (High Sensitivity): 3 ng/L (ref <18)

## 2022-08-07 LAB — BASIC METABOLIC PANEL
Anion gap: 10 (ref 5–15)
BUN: 14 mg/dL (ref 8–23)
CO2: 27 mmol/L (ref 22–32)
Calcium: 8.7 mg/dL — ABNORMAL LOW (ref 8.9–10.3)
Chloride: 105 mmol/L (ref 98–111)
Creatinine, Ser: 0.93 mg/dL (ref 0.44–1.00)
GFR, Estimated: >60 mL/min (ref >60)
Glucose, Bld: 120 mg/dL — ABNORMAL HIGH (ref 70–99)
Potassium: 3.2 mmol/L — ABNORMAL LOW (ref 3.5–5.1)
Sodium: 142 mmol/L (ref 135–145)

## 2022-08-07 LAB — BRAIN NATRIURETIC PEPTIDE
B Natriuretic Peptide: 39.4 pg/mL (ref 0.0–100.0)
Brain Natriuretic Peptide: 39 pg/mL (ref <100)

## 2022-08-07 NOTE — Discharge Instructions (Signed)
You were seen in the emergency ferment for shortness of breath and swelling in your ankles.  You had lab work done along with an x-ray and EKG.  There is no evidence of heart injury or heart failure.  There is no evidence of active bleeding.  Please continue your medications and follow-up with your primary care doctor.  Elevate your legs and watch your salt intake.  Return to the emergency department if any worsening or concerning symptoms

## 2022-08-07 NOTE — ED Triage Notes (Addendum)
Pt arrives after PCP sent pt over for a low hemoglobin. Last hemoglobin in the system is a 9.9. Pt denies SOB or dizziness. Pt endorses CP. Pt does take eliquis.

## 2022-08-07 NOTE — ED Notes (Signed)
Pt states she was seen by PCP and told to come to the ED for an abnormally low hemoglobin level. Pt states she has felt fatigued and has a swollen right foot but reports no other symptoms

## 2022-08-07 NOTE — Addendum Note (Signed)
Addended by: Mervin Kung A on: 08/07/2022 02:17 PM   Modules accepted: Orders

## 2022-08-07 NOTE — Progress Notes (Addendum)
[facility] [address], [address] [place], [place] [address] [phone]  Date:  08/07/2022   Name:  Stephanie Parks   DOB:  08/09/1945   MRN:  [id number]  PCP:  Pearline Cables, MD    Chief Complaint: No chief complaint on file.   History of Present Illness:  ARDELLA Parks is a 77 y.o. very pleasant female patient who presents with the following:  Patient seen today for virtual visit, concern of  Patient location is home, location is home.  Patient identity confirmed with 2 factors, she gives consent for virtual visit today The patient and myself are present on the call today  Our most recent visit was a virtual visit in May History of hypertension, hyperlipidemia, allergic rhinitis, OSA on CPAP. She has tended to have frequent ER visits for chest pain likely related to anxiety She also had COVID-19 complicated by pulmonary embolism January of 2022 -She has completed treatment with Eliquis and is no longer using oxygen.  Lumbar decompression 1998  Pt notes she was recently admitted with a PE at a [facility] facility from 5/26 through 529 They started her back on Eliquis which she is taking currently.  Per hospital notes, she had been seen at the [facility] ER 3 days prior with shortness of breath, diagnosed with pneumonia and discharged home with Doxy.  She got worse and had a syncopal event, was taken to [facility] where she was noted to be hypoxemic with an elevated D-dimer.  CTA showed a bilateral PE with mild right heart strain. She was on oxygen briefly but this was weaned quickly.  She was transition from Lovenox to Eliquis and sent home with Eliquis prescription We plan to have her on blood thinners long term now due to her 2nd blood clot She was supposed to have a right hip replacement but this is now delayed  She saw her new cardiologist, Dr.Zhao with [facility] on 6/6: Recurrent history of pulm emboli. First episode in  2022 after COVID. Second episode May 2024 without clear trigger. Currently on Eliquis. Echo showed dilated RV with reduced function. LV function preserved. Will continue Eliquis and repeat echo in 3 months. Intermittent palpitations since February 2024. May 2024 1 week Holter monitor at [facility] was unremarkable. Continue to have intermittent palpitations since discharge. Will repeat 2-week Holter monitor to assess arrhythmia. Plan: 2-week Holter Echo in 70-month Follow-up in 4 months   2 days prior to her visit with cardiology, patient saw my partner Dr. Zola Button on 6/4-she had complaint of hypertension and they also noted tachycardia.  Dr. Zola Button started carvedilol  Her spiro and losartan were stopped when she was in [facility] hospital -Per notes, her blood pressures were soft so these were held. She reports her weight is up by 10 lbs from her baseline and she has complaint of swelling of her "hernia" (by which she means her middle abdomen) and in her feet and ankles She may feel SOB if she walks quickly- however this is actually getting better than when she was admitted   This am 149/86, pulse 86 Sat 97% She wonders if we have eliquis samples we can give her- directed her towards the eliquis website for savings card   Echocardiogram dated 07/14/2022 Left Ventricle: Left ventricle size is normal.    Left Ventricle: There is sigmoid interventricular septum.    Left Ventricle: Systolic function is normal. EF: 55-60%.  Right Ventricle: Borderline dilated right ventricle.    Right Ventricle: Despite normal TAPSE, RV function appears mildly  reduced.   Right Ventricle: Positive McConnell's Sign, consider pulmonary  embolism.   Tricuspid Valve: There is mild regurgitation.    Tricuspid Valve: The right ventricular systolic pressure is mildly  elevated (37-49 mmHg).    Pericardium: There is no pericardial effusion.      Pulse Readings from Last 3 Encounters:  07/31/22 90   07/23/22 (!) 114  07/11/22 (!) 101   BP Readings from Last 3 Encounters:  07/31/22 128/68  07/23/22 130/70  07/11/22 (!) 146/78     Patient Active Problem List   Diagnosis Date Noted   Multiple subsegmental pulmonary emboli without acute cor pulmonale (HCC) 07/23/2022   TIA (transient ischemic attack) 06/08/2022   Diastolic dysfunction 06/08/2022   Obesity (BMI 30-39.9) 06/08/2022   OSA (obstructive sleep apnea) 06/08/2022   Unilateral primary osteoarthritis, right hip 05/29/2022   Status post total replacement of left hip 09/04/2021   DJD (degenerative joint disease) 09/04/2021   Iron deficiency anemia 03/13/2021   Erythropoietin deficiency anemia 03/13/2021   Unilateral primary osteoarthritis, left hip 10/18/2020   Greater trochanteric bursitis of left hip 10/18/2020   Headache 06/07/2020   Acute respiratory failure with hypoxia (HCC) 05/02/2020   Post-COVID chronic dyspnea 05/02/2020   Pulsatile neck mass 04/26/2020   Pneumonia due to COVID-19 virus 03/12/2020   Headache disorder 01/04/2020   Seasonal allergic conjunctivitis 10/04/2019   Dysfunction of right eustachian tube 10/04/2019   Pre-diabetes 06/02/2019   Seasonal and perennial allergic rhinitis 12/22/2018   Heart palpitations 12/22/2018   History of chest pain 07/20/2018   Brain aneurysm 07/20/2018   Dyslipidemia 05/04/2018   Tendonitis, Achilles, right 12/08/2017   Unilateral primary osteoarthritis, left knee 10/07/2017   Ingrown toenail 08/10/2017   Coughing 04/24/2017   CAD (coronary artery disease) 03/01/2015   Aneurysm (HCC) 03/01/2015   Dizziness 03/01/2015   Hypokalemia 03/01/2015   Paresthesia of arm 03/01/2015   Achalasia 09/28/2012   CN (constipation) 09/28/2012   Cephalalgia 08/24/2012   Essential hypertension 04/19/2011   Hyperlipidemia 04/19/2011   GERD (gastroesophageal reflux disease) 04/19/2011   Chest pain 04/04/2011    Past Medical History:  Diagnosis Date   Achalasia 09/28/2012    Allergic rhinitis 03/01/2015   Anemia    Aneurysm (HCC) 03/01/2015   stable 5 mm periopthalmic right ICA aneurysm 03/25/21 CTA   BP (high blood pressure) 03/01/2015   Bronchitis 04/23/2018   Cephalalgia 08/24/2012   Overview:  ICD-10 cut over     Chest pain 04/04/2011   Coronary Ca Score 0, no significant CAD 08/20/18; CP with troponin 4>32 s/p LHC showing normal coronaries 12/29/18   CN (constipation) 09/28/2012   Coughing 04/24/2017   Decreased potassium in the blood 03/01/2015   Diverticulosis    Dizziness 03/01/2015   Erythropoietin deficiency anemia 03/13/2021   GERD (gastroesophageal reflux disease)    Hiatal hernia    Hypercholesterolemia 03/01/2015   Hyperlipidemia    Hypertension    Ingrown toenail 08/10/2017   Inguinal hernia    Iron deficiency anemia 03/13/2021   Migraine headache    Onychomycosis 12/08/2017   Paresthesia of arm 03/01/2015   PE (pulmonary thromboembolism) (HCC) 03/13/2020   in setting of + COVID-19, s/p Eliquis x 3 month   Schatzki's ring    Tendonitis, Achilles, right 12/08/2017   Unilateral primary osteoarthritis, left knee 10/07/2017    Past Surgical History:  Procedure Laterality Date  ABDOMINAL HYSTERECTOMY     BACK SURGERY     x 3   CARDIAC CATHETERIZATION  12/29/2018   angiographically normal coronary arteries 12/29/18 ([place])   CARPAL TUNNEL RELEASE     left wrist   CERVICAL SPINE SURGERY     HEMORRHOID SURGERY     TOTAL HIP ARTHROPLASTY Left 09/04/2021   Procedure: LEFT TOTAL HIP ARTHROPLASTY ANTERIOR APPROACH;  Surgeon: Kathryne Hitch, MD;  Location: [facility] OR;  Service: Orthopedics;  Laterality: Left;    Social History   Tobacco Use   Smoking status: Former    Packs/day: 0.50    Years: 20.00    Additional pack years: 0.00    Total pack years: 10.00    Types: Cigarettes    Quit date: 02/19/1984    Years since quitting: 38.4   Smokeless tobacco: Never  Vaping Use   Vaping Use: Never used  Substance Use Topics    Alcohol use: No   Drug use: No    Family History  Problem Relation Age of Onset   Allergic rhinitis Sister    Diabetes Sister    Hypertension Sister    Heart attack Father 60   Heart disease Father    Stomach cancer Maternal Grandmother    Heart disease Mother    Colon cancer Neg Hx    Angioedema Neg Hx    Asthma Neg Hx    Eczema Neg Hx    Urticaria Neg Hx    Immunodeficiency Neg Hx    Breast cancer Neg Hx    Migraines Neg Hx    Headache Neg Hx     Allergies  Allergen Reactions   Shellfish Allergy Hives and Swelling   Ace Inhibitors Other (See Comments)    Pt cannot recall her reaction but tolerates arb    Gabapentin Other (See Comments)    headaches   Pitavastatin     Unknown reaction  Other Reaction(s): Other (See Comments)  ADVERSE REACTION,  Pt unaware of this reaction; states she has been on crestor in the past with no issues   Sulfa Antibiotics Other (See Comments) and Rash    Fine bumps   Topiramate Palpitations    Heart race,     Medication list has been reviewed and updated.  Current Outpatient Medications on File Prior to Visit  Medication Sig Dispense Refill   aspirin EC 81 MG tablet Take 1 tablet (81 mg total) by mouth daily. Swallow whole. 30 tablet 0   Calcium Carb-Cholecalciferol (CALCIUM 600 + D PO) Take 1 tablet by mouth daily.     carboxymethylcellulose (REFRESH PLUS) 0.5 % SOLN Place 1 drop into both eyes 3 (three) times daily as needed (dry eyes).     carvedilol (COREG) 6.25 MG tablet Take 1 tablet (6.25 mg total) by mouth 2 (two) times daily with a meal. 60 tablet 3   Cyanocobalamin (B-12 PO) Take 1 tablet by mouth daily. (Patient not taking: Reported on 07/31/2022)     ELDERBERRY PO Take 1 capsule by mouth daily.     EPINEPHrine (EPIPEN 2-PAK) 0.3 mg/0.3 mL IJ SOAJ injection Inject 0.3 mg into the muscle as needed for anaphylaxis. Per allergen immunotherapy protocol 1 each 2   fluticasone (FLONASE) 50 MCG/ACT nasal spray Place 2 sprays  into both nostrils daily as needed for allergies. 48 g 3   KLOR-CON M20 20 MEQ tablet TAKE 2 TABLETS BY MOUTH EVERY DAY 180 tablet 1   Multiple Vitamins-Minerals (ALIVE MULTI-VITAMIN PO) Take 1  tablet by mouth daily.     NON FORMULARY Pt uses a cpap nightly     nortriptyline (PAMELOR) 25 MG capsule Take 2 capsules (50 mg total) by mouth at bedtime. 180 capsule 3   polyethylene glycol (MIRALAX) 17 g packet Take 17 g by mouth daily as needed for mild constipation or moderate constipation. 90 packet 2   rosuvastatin (CRESTOR) 20 MG tablet Take 1 tablet (20 mg total) by mouth daily. 30 tablet 0   No current facility-administered medications on file prior to visit.    Review of Systems:  As per HPI- otherwise negative.   Physical Examination: There were no vitals filed for this visit. There were no vitals filed for this visit. There is no height or weight on file to calculate BMI. Ideal Body Weight:    Patient observed over video monitor.  She looks well, her normal self.  No distress is noted  Assessment and Plan: Lower extremity edema - Plan: B Nat Peptide, Comprehensive metabolic panel, CBC, DG Chest 2 View  Anemia, unspecified type - Plan: CBC  Recurrent pulmonary embolism (HCC)  Patient seen today for a virtual visit.  Video monitor used for duration of assessment.  As above, patient with recent recurrent pulmonary embolism now on Eliquis.  Her previous PE occurred in the setting of COVID-19 in 2022  Patient expressed to me that she has shortness of breath and her weight has gone up.  I advised her this will be best assessed at the emergency room today.  However, patient states this has been going on for at least 10 days, she is not willing to go to the ER right now.  Her spironolactone was stopped last month, this could be why her weight is up.  She did agree to come in later today for lab work and a chest x-ray- I will be in touch with these results asap We may want to restart her  spiro to help with edema and fluid retention  Signed Abbe Amsterdam, MD  Received her labs  Note labs from [facility] 5/23 on chart- Hg of 8.2 on 5/23- HOWEVER This was with IV start and I think is spurious, her Hg was 12.4 earlier the same day   CBC dated 5/29 at [facility] shows Hg as below-  WBC 4.0 - 10.5 thou/mcL 11.3 High   RBC 3.93 - 5.22 million/mcL 3.84 Low   HGB 11.2 - 15.7 gm/dL 65.7  HCT 84.6 - 96.2 % 35.8  MCV 79.4 - 94.8 fL 93.2  MCH 25.6 - 32.2 pg 29.7  MCHC 32.2 - 35.5 gm/dL 95.2 Low   Plt Ct 841 - 400 thou/mcL 210   Labs from today as below Called pt I am concerned that her Hg as dropped about 2.5 g over the course of 3 weeks, and she is now on Eliquis, she notes some black discoloration of her stools but no BRBPR or other bleeding. Also mid- abdomen discomfort. We discussed- I am concerned she may have a GI bleed. She is willing to come to the ER here at the [facility] for further evaluation right away  Results for orders placed or performed in visit on 08/07/22  B Nat Peptide  Result Value Ref Range   Brain Natriuretic Peptide 39 <100 pg/mL  CBC  Result Value Ref Range   WBC 7.2 3.8 - 10.8 Thousand/uL   RBC 3.41 (L) 3.80 - 5.10 Million/uL   Hemoglobin 9.9 (L) 11.7 - 15.5 g/dL   HCT 32.4 (L)  35.0 - 45.0 %   MCV 90.6 80.0 - 100.0 fL   MCH 29.0 27.0 - 33.0 pg   MCHC 32.0 32.0 - 36.0 g/dL   RDW 16.1 09.6 - 04.5 %   Platelets 343 140 - 400 Thousand/uL   MPV 9.0 7.5 - 12.5 fL  Comprehensive metabolic panel  Result Value Ref Range   Glucose, Bld 114 (H) 65 - 99 mg/dL   BUN 12 7 - 25 mg/dL   Creat 4.09 8.11 - 9.14 mg/dL   BUN/Creatinine Ratio SEE NOTE: 6 - 22 (calc)   Sodium 142 135 - 146 mmol/L   Potassium 3.8 3.5 - 5.3 mmol/L   Chloride 105 98 - 110 mmol/L   CO2 30 20 - 32 mmol/L   Calcium 9.0 8.6 - 10.4 mg/dL   Total Protein 6.0 (L) 6.1 - 8.1 g/dL   Albumin 3.7 3.6 - 5.1 g/dL   Globulin 2.3 1.9 - 3.7 g/dL (calc)   AG Ratio 1.6 1.0 - 2.5 (calc)   Total  Bilirubin 0.3 0.2 - 1.2 mg/dL   Alkaline phosphatase (APISO) 81 37 - 153 U/L   AST 16 10 - 35 U/L   ALT 11 6 - 29 U/L   *Note: Due to a large number of results and/or encounters for the requested time period, some results have not been displayed. A complete set of results can be found in Results Review.

## 2022-08-07 NOTE — ED Provider Notes (Signed)
[place] EMERGENCY DEPARTMENT AT [facility] Provider Note   CSN: [id number] Arrival date & time: 08/07/22  1838     History {Add pertinent medical, surgical, social history, OB history to HPI:1} Chief Complaint  Patient presents with   Abnormal Labs    Stephanie Parks is a 77 y.o. female.  He was just discharged from [facility] after being admitted for PE.  She is now on Eliquis.  She had a routine video visit with her primary care doctor today and told him about her shortness of breath and leg swelling that is been going on since her hospitalization.  They sent her in to get some blood work and found her hemoglobin had dropped and she did endorse possibly some dark stools.  She denies any abdominal pain.  She feels her shortness of breath is better than when she was hospitalized but just not back to baseline.  She said she is always anemic but has never really gotten worked up for it.  She has had colonoscopies in the past and they found polyps but no significant findings.  No hemoptysis or hematemesis.  The history is provided by the patient.  Shortness of Breath Severity:  Moderate Duration:  1 month Timing:  Intermittent Progression:  Partially resolved Worsened by:  Activity Ineffective treatments:  Rest Associated symptoms: no abdominal pain, no chest pain, no cough, no fever, no hemoptysis and no sputum production   Risk factors: hx of PE/DVT        Home Medications Prior to Admission medications   Medication Sig Start Date End Date Taking? Authorizing Provider  aspirin EC 81 MG tablet Take 1 tablet (81 mg total) by mouth daily. Swallow whole. 06/09/22   Leroy Sea, MD  Calcium Carb-Cholecalciferol (CALCIUM 600 + D PO) Take 1 tablet by mouth daily.    [provider]  carboxymethylcellulose (REFRESH PLUS) 0.5 % SOLN Place 1 drop into both eyes 3 (three) times daily as needed (dry eyes).    [provider]  carvedilol (COREG) 6.25 MG  tablet Take 1 tablet (6.25 mg total) by mouth 2 (two) times daily with a meal. 07/23/22   Zola Button, Grayling Congress, DO  Cyanocobalamin (B-12 PO) Take 1 tablet by mouth daily. Patient not taking: Reported on 07/31/2022    [provider]  ELDERBERRY PO Take 1 capsule by mouth daily.    [provider]  EPINEPHrine (EPIPEN 2-PAK) 0.3 mg/0.3 mL IJ SOAJ injection Inject 0.3 mg into the muscle as needed for anaphylaxis. Per allergen immunotherapy protocol 09/27/21   Copland, Gwenlyn Found, MD  fluticasone (FLONASE) 50 MCG/ACT nasal spray Place 2 sprays into both nostrils daily as needed for allergies. 04/11/21   Copland, Gwenlyn Found, MD  KLOR-CON M20 20 MEQ tablet TAKE 2 TABLETS BY MOUTH EVERY DAY 07/22/22   Copland, Gwenlyn Found, MD  Multiple Vitamins-Minerals (ALIVE MULTI-VITAMIN PO) Take 1 tablet by mouth daily.    [provider]  NON FORMULARY Pt uses a cpap nightly    [provider]  nortriptyline (PAMELOR) 25 MG capsule Take 2 capsules (50 mg total) by mouth at bedtime. 03/21/22   Lomax, Amy, NP  polyethylene glycol (MIRALAX) 17 g packet Take 17 g by mouth daily as needed for mild constipation or moderate constipation. 07/01/22   Copland, Gwenlyn Found, MD  rosuvastatin (CRESTOR) 20 MG tablet Take 1 tablet (20 mg total) by mouth daily. 06/09/22   Leroy Sea, MD  Allergies    Shellfish allergy, Ace inhibitors, Gabapentin, Pitavastatin, Sulfa antibiotics, and Topiramate    Review of Systems   Review of Systems  Constitutional:  Negative for fever.  Respiratory:  Positive for shortness of breath. Negative for cough, hemoptysis and sputum production.   Cardiovascular:  Positive for leg swelling. Negative for chest pain.  Gastrointestinal:  Negative for abdominal pain.    Physical Exam Updated Vital Signs BP (!) 142/69 (BP Location: Left Arm)   Pulse 98   Temp 98.6 F (37 C)   Resp 20   Ht 5\' 4"  (1.626 m)   Wt 107 kg   SpO2 95%   BMI 40.51 kg/m  Physical  Exam Vitals and nursing note reviewed.  Constitutional:      General: She is not in acute distress.    Appearance: Normal appearance. She is well-developed.  HENT:     Head: Normocephalic and atraumatic.  Eyes:     Conjunctiva/sclera: Conjunctivae normal.  Cardiovascular:     Rate and Rhythm: Normal rate and regular rhythm.     Heart sounds: No murmur heard. Pulmonary:     Effort: Pulmonary effort is normal. No respiratory distress.     Breath sounds: Normal breath sounds.  Abdominal:     Palpations: Abdomen is soft.     Tenderness: There is no abdominal tenderness. There is no guarding or rebound.  Musculoskeletal:        General: No tenderness. Normal range of motion.     Cervical back: Neck supple.     Right lower leg: Edema present.     Left lower leg: Edema present.  Skin:    General: Skin is warm and dry.     Capillary Refill: Capillary refill takes less than 2 seconds.  Neurological:     General: No focal deficit present.     Mental Status: She is alert.     Sensory: No sensory deficit.     Motor: No weakness.     ED Results / Procedures / Treatments   Labs (all labs ordered are listed, but only abnormal results are displayed) Labs Reviewed  BASIC METABOLIC PANEL  CBC  TROPONIN I (HIGH SENSITIVITY)    EKG None  Radiology DG Chest 2 View  Result Date: 08/07/2022 CLINICAL DATA:  Chest pain and shortness of breath on exertion with low hemoglobin. EXAM: CHEST - 2 VIEW COMPARISON:  Jul 11, 2022 and May 22, 2022 FINDINGS: The heart size and mediastinal contours are within normal limits. Mild, chronic appearing, diffusely increased interstitial lung markings are seen. No focal consolidation, pleural effusion or pneumothorax is identified. A radiopaque fusion plate and screws are seen overlying the cervical spine. Multilevel degenerative changes seen throughout the thoracic spine. IMPRESSION: Chronic appearing interstitial lung disease without acute or active  cardiopulmonary disease. Electronically Signed   By: Aram Candela M.D.   On: 08/07/2022 19:22    Procedures Procedures  {Document cardiac monitor, telemetry assessment procedure when appropriate:1}  Medications Ordered in ED Medications - No data to display  ED Course/ Medical Decision Making/ A&P   {   Click here for ABCD2, HEART and other calculatorsREFRESH Note before signing :1}                          Medical Decision Making Amount and/or Complexity of Data Reviewed Labs: ordered. Radiology: ordered.   This patient complains of ***; this involves an extensive number of treatment Options and is  a complaint that carries with it a high risk of complications and morbidity. The differential includes ***  I ordered, reviewed and interpreted labs, which included *** I ordered medication *** and reviewed PMP when indicated. I ordered imaging studies which included *** and I independently    visualized and interpreted imaging which showed *** Additional history obtained from *** Previous records obtained and reviewed *** I consulted *** and discussed lab and imaging findings and discussed disposition.  Cardiac monitoring reviewed, *** Social determinants considered, *** Critical Interventions: ***  After the interventions stated above, I reevaluated the patient and found *** Admission and further testing considered, ***   {Document critical care time when appropriate:1} {Document review of labs and clinical decision tools ie heart score, Chads2Vasc2 etc:1}  {Document your independent review of radiology images, and any outside records:1} {Document your discussion with family members, caretakers, and with consultants:1} {Document social determinants of health affecting pt's care:1} {Document your decision making why or why not admission, treatments were needed:1} Final Clinical Impression(s) / ED Diagnoses Final diagnoses:  None    Rx / DC Orders ED Discharge  Orders     None

## 2022-08-14 ENCOUNTER — Ambulatory Visit (INDEPENDENT_AMBULATORY_CARE_PROVIDER_SITE_OTHER): Payer: Medicare Other

## 2022-08-14 DIAGNOSIS — I10 Essential (primary) hypertension: Secondary | ICD-10-CM | POA: Diagnosis not present

## 2022-08-14 DIAGNOSIS — I2609 Other pulmonary embolism with acute cor pulmonale: Secondary | ICD-10-CM | POA: Diagnosis not present

## 2022-08-14 DIAGNOSIS — J309 Allergic rhinitis, unspecified: Secondary | ICD-10-CM | POA: Diagnosis not present

## 2022-08-14 DIAGNOSIS — Z86711 Personal history of pulmonary embolism: Secondary | ICD-10-CM | POA: Diagnosis not present

## 2022-08-14 DIAGNOSIS — Z7901 Long term (current) use of anticoagulants: Secondary | ICD-10-CM | POA: Diagnosis not present

## 2022-08-18 NOTE — Progress Notes (Unsigned)
[facility] at [facility] [address] [place], [place] [address] [phone]  Date:  08/19/2022   Name:  Stephanie Parks   DOB:  [birth year]   MRN:  [id number]  PCP:  Pearline Cables, MD    Chief Complaint: swelling, not feeling well (1. Sob started about 1 month hx of PE. 2. Pt says she needs refills on Omeprazole. Has No refills on Notriptiline. And the Hospital d/cd the Losartan and spironolactone. 3.Edema in the ankles)   History of Present Illness:  Stephanie Parks is a 77 y.o. very pleasant female patient who presents with the following:  Patient seen today for follow-up.  She contacted me last week with concern of leg swelling Most recent visit with myself was last month for virtual visit also for concern of lower extremity edema From that visit:  History of hypertension, hyperlipidemia, allergic rhinitis, OSA on CPAP. She has tended to have frequent ER visits for chest pain likely related to anxiety She also had COVID-19 complicated by pulmonary embolism January of 2022 -She has completed treatment with Eliquis and is no longer using oxygen.  Lumbar decompression 1998   Pt notes she was recently admitted with a PE at a [facility] facility from 5/26 through 07/17/22 They started her back on Eliquis which she is taking currently.  Per hospital notes, she had been seen at the [facility] ER 3 days prior with shortness of breath, diagnosed with pneumonia and discharged home with Doxy.  She got worse and had a syncopal event, was taken to [facility] where she was noted to be hypoxemic with an elevated D-dimer.  CTA showed a bilateral PE with mild right heart strain. She was on oxygen briefly but this was weaned quickly.  She was transition from Lovenox to Eliquis and sent home with Eliquis prescription We plan to have her on blood thinners long term now due to her 2nd blood clot She was supposed to have a right hip replacement but this is  now delayed   She saw her new cardiologist, Dr.Zhao with [facility] on 6/6: Recurrent history of pulm emboli. First episode in 2022 after COVID. Second episode May 2024 without clear trigger. Currently on Eliquis. Echo showed dilated RV with reduced function. LV function preserved. Will continue Eliquis and repeat echo in 3 months. Intermittent palpitations since February 2024. May 2024 1 week Holter monitor at [facility] health was unremarkable. Continue to have intermittent palpitations since discharge. Will repeat 2-week Holter monitor to assess arrhythmia. Plan: 2-week Holter Echo in 38-month Follow-up in 4 months    2 days prior to her visit with cardiology, patient saw my partner Dr. Zola Button on 6/4-she had complaint of hypertension and they also noted tachycardia.  Dr. Zola Button started carvedilol Her spiro and losartan were stopped when she was in [facility] hospital -Per notes, her blood pressures were soft so these were held. She reports her weight is up by 10 lbs from her baseline and she has complaint of swelling of her "hernia" (by which she means her middle abdomen) and in her feet and ankles She may feel SOB if she walks quickly- however this is actually getting better than when she was admitted    I got some labs for that day, we noted acute concerning drop in her hemoglobin.  I had her come into the ER where she was assessed and released to home  Today she notes she is "hurting  all over, ugh just so many things" They had to postpone her hip replacement due to her recent blood clot  She notes her foot swelling is about stable but not any better She also complains of shortness of breath.  At first she said this was new, but then restated this has been present since before her PE diagnosis and is getting better.  She is still taking potassium 40 meq daily She was previously taking spironolactone 12.5, this was stopped due to soft blood pressure.  Her blood pressure has now come up, we will  get her started back on spinal lactone in hopes of also helping reduce swelling Wt Readings from Last 3 Encounters:  08/19/22 235 lb 9.6 oz (106.9 kg)  08/07/22 236 lb (107 kg)  07/31/22 234 lb 8 oz (106.4 kg)   Weight 230 in December   Reviewed hematology note from [facility] 6/26:  Dorian Furnace is seen in consultation for Pulmonary embolism. Review of EMR reveals her first episode of right upper lobe pulmonary emboli was in January 2022 after COVID infection. Was on blood thinner for 6 months. Recurrent episode of PE was May 2024. After inpatient management with IV heparin she was transition to p.o. Eliquis before discharge home. We discussed today the importance of taking Eliquis every 12 hours scheduled. She currently denies any abnormal bleeding known to her. We discussed that generally with the second event, we continue anticoagulation without a compelling reason to discontinue use. Concern is that there is associated risk of developing further clot with stopping the anticoagulation. We reviewed that the recent ultrasound of the lower extremities was negative for DVT. We also discussed the option to move forward with some hypercoagulable workup which would not be impacted by use of anticoagulation to include factor V, factor II, beta-2 glycoprotein, anticardiolipin. Additional testing is not being completed currently as the results may not be reliable. She is in agreement with this plan. Discussed that we would not expect further clots to develop while on anticoagulation but any concerning symptoms further evaluation is warranted. Currently we will plan to have her follow-up in 1 month, or sooner if needed. She will continue Eliquis 5 mg twice daily. She denies need for any refills today.  Patient Active Problem List   Diagnosis Date Noted   Multiple subsegmental pulmonary emboli without acute cor pulmonale (HCC) 07/23/2022   TIA (transient ischemic attack) 06/08/2022   Diastolic dysfunction  06/08/2022   Obesity (BMI 30-39.9) 06/08/2022   OSA (obstructive sleep apnea) 06/08/2022   Unilateral primary osteoarthritis, right hip 05/29/2022   Status post total replacement of left hip 09/04/2021   DJD (degenerative joint disease) 09/04/2021   Iron deficiency anemia 03/13/2021   Erythropoietin deficiency anemia 03/13/2021   Unilateral primary osteoarthritis, left hip 10/18/2020   Greater trochanteric bursitis of left hip 10/18/2020   Headache 06/07/2020   Acute respiratory failure with hypoxia (HCC) 05/02/2020   Post-COVID chronic dyspnea 05/02/2020   Pulsatile neck mass 04/26/2020   Pneumonia due to COVID-19 virus 03/12/2020   Headache disorder 01/04/2020   Seasonal allergic conjunctivitis 10/04/2019   Dysfunction of right eustachian tube 10/04/2019   Pre-diabetes 06/02/2019   Seasonal and perennial allergic rhinitis 12/22/2018   Heart palpitations 12/22/2018   History of chest pain 07/20/2018   Brain aneurysm 07/20/2018   Dyslipidemia 05/04/2018   Tendonitis, Achilles, right 12/08/2017   Unilateral primary osteoarthritis, left knee 10/07/2017   Ingrown toenail 08/10/2017   Coughing 04/24/2017   CAD (coronary artery disease)  03/01/2015   Aneurysm (HCC) 03/01/2015   Dizziness 03/01/2015   Hypokalemia 03/01/2015   Paresthesia of arm 03/01/2015   Achalasia 09/28/2012   CN (constipation) 09/28/2012   Cephalalgia 08/24/2012   Essential hypertension 04/19/2011   Hyperlipidemia 04/19/2011   GERD (gastroesophageal reflux disease) 04/19/2011   Chest pain 04/04/2011    Past Medical History:  Diagnosis Date   Achalasia 09/28/2012   Allergic rhinitis 03/01/2015   Anemia    Aneurysm (HCC) 03/01/2015   stable 5 mm periopthalmic right ICA aneurysm 03/25/21 CTA   BP (high blood pressure) 03/01/2015   Bronchitis 04/23/2018   Cephalalgia 08/24/2012   Overview:  ICD-10 cut over     Chest pain 04/04/2011   Coronary Ca Score 0, no significant CAD 08/20/18; CP with troponin 4>32  s/p LHC showing normal coronaries 12/29/18   CN (constipation) 09/28/2012   Coughing 04/24/2017   Decreased potassium in the blood 03/01/2015   Diverticulosis    Dizziness 03/01/2015   Erythropoietin deficiency anemia 03/13/2021   GERD (gastroesophageal reflux disease)    Hiatal hernia    Hypercholesterolemia 03/01/2015   Hyperlipidemia    Hypertension    Ingrown toenail 08/10/2017   Inguinal hernia    Iron deficiency anemia 03/13/2021   Migraine headache    Onychomycosis 12/08/2017   Paresthesia of arm 03/01/2015   PE (pulmonary thromboembolism) (HCC) 03/13/2020   in setting of + COVID-19, s/p Eliquis x 3 month   Schatzki's ring    Tendonitis, Achilles, right 12/08/2017   Unilateral primary osteoarthritis, left knee 10/07/2017    Past Surgical History:  Procedure Laterality Date   ABDOMINAL HYSTERECTOMY     BACK SURGERY     x 3   CARDIAC CATHETERIZATION  12/29/2018   angiographically normal coronary arteries 12/29/18 ([place])   CARPAL TUNNEL RELEASE     left wrist   CERVICAL SPINE SURGERY     HEMORRHOID SURGERY     TOTAL HIP ARTHROPLASTY Left 09/04/2021   Procedure: LEFT TOTAL HIP ARTHROPLASTY ANTERIOR APPROACH;  Surgeon: Kathryne Hitch, MD;  Location: [facility] OR;  Service: Orthopedics;  Laterality: Left;    Social History   Tobacco Use   Smoking status: Former    Packs/day: 0.50    Years: 20.00    Additional pack years: 0.00    Total pack years: 10.00    Types: Cigarettes    Quit date: 02/19/1984    Years since quitting: 38.5   Smokeless tobacco: Never  Vaping Use   Vaping Use: Never used  Substance Use Topics   Alcohol use: No   Drug use: No    Family History  Problem Relation Age of Onset   Allergic rhinitis Sister    Diabetes Sister    Hypertension Sister    Heart attack Father 65   Heart disease Father    Stomach cancer Maternal Grandmother    Heart disease Mother    Colon cancer Neg Hx    Angioedema Neg Hx    Asthma Neg Hx    Eczema  Neg Hx    Urticaria Neg Hx    Immunodeficiency Neg Hx    Breast cancer Neg Hx    Migraines Neg Hx    Headache Neg Hx     Allergies  Allergen Reactions   Shellfish Allergy Hives and Swelling   Ace Inhibitors Other (See Comments)    Pt cannot recall her reaction but tolerates arb    Gabapentin Other (See Comments)    headaches  Pitavastatin     Unknown reaction  Other Reaction(s): Other (See Comments)  ADVERSE REACTION,  Pt unaware of this reaction; states she has been on crestor in the past with no issues   Sulfa Antibiotics Other (See Comments) and Rash    Fine bumps   Topiramate Palpitations    Heart race,     Medication list has been reviewed and updated.  Current Outpatient Medications on File Prior to Visit  Medication Sig Dispense Refill   aspirin EC 81 MG tablet Take 1 tablet (81 mg total) by mouth daily. Swallow whole. 30 tablet 0   Calcium Carb-Cholecalciferol (CALCIUM 600 + D PO) Take 1 tablet by mouth daily.     carboxymethylcellulose (REFRESH PLUS) 0.5 % SOLN Place 1 drop into both eyes 3 (three) times daily as needed (dry eyes).     carvedilol (COREG) 6.25 MG tablet Take 1 tablet (6.25 mg total) by mouth 2 (two) times daily with a meal. 60 tablet 3   Cyanocobalamin (B-12 PO) Take 1 tablet by mouth daily. (Patient not taking: Reported on 07/31/2022)     ELDERBERRY PO Take 1 capsule by mouth daily.     EPINEPHrine (EPIPEN 2-PAK) 0.3 mg/0.3 mL IJ SOAJ injection Inject 0.3 mg into the muscle as needed for anaphylaxis. Per allergen immunotherapy protocol 1 each 2   fluticasone (FLONASE) 50 MCG/ACT nasal spray Place 2 sprays into both nostrils daily as needed for allergies. 48 g 3   KLOR-CON M20 20 MEQ tablet TAKE 2 TABLETS BY MOUTH EVERY DAY 180 tablet 1   Multiple Vitamins-Minerals (ALIVE MULTI-VITAMIN PO) Take 1 tablet by mouth daily.     NON FORMULARY Pt uses a cpap nightly     nortriptyline (PAMELOR) 25 MG capsule Take 2 capsules (50 mg total) by mouth at bedtime.  180 capsule 3   polyethylene glycol (MIRALAX) 17 g packet Take 17 g by mouth daily as needed for mild constipation or moderate constipation. 90 packet 2   rosuvastatin (CRESTOR) 20 MG tablet Take 1 tablet (20 mg total) by mouth daily. 30 tablet 0   No current facility-administered medications on file prior to visit.    Review of Systems:  As per HPI- otherwise negative.   Physical Examination: Vitals:   08/19/22 1404  BP: (!) 154/80  Pulse: 90  Resp: 18  Temp: 97.8 F (36.6 C)  SpO2: 97%   Vitals:   08/19/22 1404  Weight: 235 lb 9.6 oz (106.9 kg)  Height: 5\' 4"  (1.626 m)   Body mass index is 40.44 kg/m. Ideal Body Weight: Weight in (lb) to have BMI = 25: 145.3  GEN: no acute distress.  Obese, appears her normal self HEENT: Atraumatic, Normocephalic.  Ears and Nose: No external deformity. CV: RRR, No M/G/R. No JVD. No thrill. No extra heart sounds. PULM: CTA B, no wheezes, crackles, rhonchi. No retractions. No resp. distress. No accessory muscle use. ABD: S, NT, ND, +BS. No rebound. No HSM. EXTR: No c/c/stable 1+ edema in both lower extremities to the knees is present-no skin breakdown or weeping PSYCH: Normally interactive. Conversant.    Assessment and Plan: Lower extremity edema - Plan: spironolactone (ALDACTONE) 25 MG tablet  Recurrent pulmonary embolism (HCC) - Plan: apixaban (ELIQUIS) 5 MG TABS tablet  Gastroesophageal reflux disease without esophagitis - Plan: omeprazole (PRILOSEC) 40 MG capsule  Anemia, unspecified type - Plan: CBC  Medication monitoring encounter - Plan: Comprehensive metabolic panel  Wheezing - Plan: albuterol (VENTOLIN HFA) 108 (90 Base) MCG/ACT inhaler  Patient seen today for follow-up-her main concern is lower extremity edema.  This is not really a new issue, but worse since her spironolactone was stopped due to soft blood pressure.  Blood pressure is now moderately elevated, will have her start back on spironolactone 12.5.  She is  also still taking potassium, will check a CMP today and follow-up closely Most recent potassium about 12 days ago was still low at 3.2  She did not have a prescription for her Eliquis, I provided this today and gave her a month of samples Refilled omeprazole that she is using for GERD Will plan further follow- up pending labs. Discussed her shortness of breath.  I explained that her pulmonary embolism is presumably improving with Eliquis, but the only way to know this for sure would be to repeat a CT angiogram.  I offered to order this, she prefers to delay for now as she feels her shortness of breath is actually getting better   Signed Abbe Amsterdam, MD  Addendum 7/2, received labs as below.  Message to patient Hemoglobin is trending in the right direction Will have her decrease potassium to 20 mill equivalents daily since she has added back spironolactone  Results for orders placed or performed in visit on 08/19/22  CBC  Result Value Ref Range   WBC 7.1 4.0 - 10.5 K/uL   RBC 3.52 (L) 3.87 - 5.11 Mil/uL   Platelets 303.0 150.0 - 400.0 K/uL   Hemoglobin 10.5 (L) 12.0 - 15.0 g/dL   HCT 16.1 (L) 09.6 - 04.5 %   MCV 91.6 78.0 - 100.0 fl   MCHC 32.7 30.0 - 36.0 g/dL   RDW 40.9 (H) 81.1 - 91.4 %  Comprehensive metabolic panel  Result Value Ref Range   Sodium 141 135 - 145 mEq/L   Potassium 3.9 3.5 - 5.1 mEq/L   Chloride 102 96 - 112 mEq/L   CO2 31 19 - 32 mEq/L   Glucose, Bld 94 70 - 99 mg/dL   BUN 12 6 - 23 mg/dL   Creatinine, Ser 7.82 0.40 - 1.20 mg/dL   Total Bilirubin 0.3 0.2 - 1.2 mg/dL   Alkaline Phosphatase 79 39 - 117 U/L   AST 18 0 - 37 U/L   ALT 10 0 - 35 U/L   Total Protein 6.2 6.0 - 8.3 g/dL   Albumin 3.7 3.5 - 5.2 g/dL   GFR 95.62 >13.08 mL/min   Calcium 9.8 8.4 - 10.5 mg/dL   *Note: Due to a large number of results and/or encounters for the requested time period, some results have not been displayed. A complete set of results can be found in Results Review.

## 2022-08-19 ENCOUNTER — Ambulatory Visit (INDEPENDENT_AMBULATORY_CARE_PROVIDER_SITE_OTHER): Payer: Medicare Other | Admitting: Family Medicine

## 2022-08-19 VITALS — BP 154/80 | HR 90 | Temp 97.8°F | Resp 18 | Ht 64.0 in | Wt 235.6 lb

## 2022-08-19 DIAGNOSIS — K219 Gastro-esophageal reflux disease without esophagitis: Secondary | ICD-10-CM

## 2022-08-19 DIAGNOSIS — R600 Localized edema: Secondary | ICD-10-CM | POA: Diagnosis not present

## 2022-08-19 DIAGNOSIS — Z5181 Encounter for therapeutic drug level monitoring: Secondary | ICD-10-CM

## 2022-08-19 DIAGNOSIS — I2699 Other pulmonary embolism without acute cor pulmonale: Secondary | ICD-10-CM | POA: Diagnosis not present

## 2022-08-19 DIAGNOSIS — R062 Wheezing: Secondary | ICD-10-CM

## 2022-08-19 DIAGNOSIS — D649 Anemia, unspecified: Secondary | ICD-10-CM | POA: Diagnosis not present

## 2022-08-19 MED ORDER — APIXABAN 5 MG PO TABS
5.0000 mg | ORAL_TABLET | Freq: Two times a day (BID) | ORAL | 5 refills | Status: DC
Start: 2022-08-19 — End: 2023-01-08

## 2022-08-19 MED ORDER — ALBUTEROL SULFATE HFA 108 (90 BASE) MCG/ACT IN AERS
2.0000 | INHALATION_SPRAY | Freq: Four times a day (QID) | RESPIRATORY_TRACT | 2 refills | Status: DC | PRN
Start: 2022-08-19 — End: 2023-01-08

## 2022-08-19 MED ORDER — OMEPRAZOLE 40 MG PO CPDR
40.0000 mg | DELAYED_RELEASE_CAPSULE | Freq: Every day | ORAL | 3 refills | Status: AC
Start: 2022-08-19 — End: ?

## 2022-08-19 MED ORDER — SPIRONOLACTONE 25 MG PO TABS
12.5000 mg | ORAL_TABLET | Freq: Every day | ORAL | 3 refills | Status: DC
Start: 2022-08-19 — End: 2023-09-24

## 2022-08-19 NOTE — Patient Instructions (Addendum)
It was good to see you again today I will be in touch with your labs asap We gave you some eliquis samples today to hold you over for a while!   Let me know if your shortness of breath is getting worse or not improving

## 2022-08-20 ENCOUNTER — Encounter: Payer: Medicare Other | Admitting: Physician Assistant

## 2022-08-20 ENCOUNTER — Encounter: Payer: Self-pay | Admitting: Family Medicine

## 2022-08-20 LAB — CBC
HCT: 32.2 % — ABNORMAL LOW (ref 36.0–46.0)
Hemoglobin: 10.5 g/dL — ABNORMAL LOW (ref 12.0–15.0)
MCHC: 32.7 g/dL (ref 30.0–36.0)
MCV: 91.6 fl (ref 78.0–100.0)
Platelets: 303 10*3/uL (ref 150.0–400.0)
RBC: 3.52 Mil/uL — ABNORMAL LOW (ref 3.87–5.11)
RDW: 15.8 % — ABNORMAL HIGH (ref 11.5–15.5)
WBC: 7.1 10*3/uL (ref 4.0–10.5)

## 2022-08-20 LAB — COMPREHENSIVE METABOLIC PANEL
ALT: 10 U/L (ref 0–35)
AST: 18 U/L (ref 0–37)
Albumin: 3.7 g/dL (ref 3.5–5.2)
Alkaline Phosphatase: 79 U/L (ref 39–117)
BUN: 12 mg/dL (ref 6–23)
CO2: 31 mEq/L (ref 19–32)
Calcium: 9.8 mg/dL (ref 8.4–10.5)
Chloride: 102 mEq/L (ref 96–112)
Creatinine, Ser: 0.76 mg/dL (ref 0.40–1.20)
GFR: 75.88 mL/min (ref >60.00)
Glucose, Bld: 94 mg/dL (ref 70–99)
Potassium: 3.9 mEq/L (ref 3.5–5.1)
Sodium: 141 mEq/L (ref 135–145)
Total Bilirubin: 0.3 mg/dL (ref 0.2–1.2)
Total Protein: 6.2 g/dL (ref 6.0–8.3)

## 2022-08-26 ENCOUNTER — Encounter: Payer: Medicare Other | Admitting: Physician Assistant

## 2022-08-27 DIAGNOSIS — M6281 Muscle weakness (generalized): Secondary | ICD-10-CM | POA: Diagnosis not present

## 2022-08-27 DIAGNOSIS — R262 Difficulty in walking, not elsewhere classified: Secondary | ICD-10-CM | POA: Diagnosis not present

## 2022-08-27 DIAGNOSIS — I2609 Other pulmonary embolism with acute cor pulmonale: Secondary | ICD-10-CM | POA: Diagnosis not present

## 2022-08-30 NOTE — Progress Notes (Signed)
EXP 09/02/23

## 2022-09-02 DIAGNOSIS — J3089 Other allergic rhinitis: Secondary | ICD-10-CM | POA: Diagnosis not present

## 2022-09-11 ENCOUNTER — Telehealth: Payer: Self-pay | Admitting: Orthopaedic Surgery

## 2022-09-11 NOTE — Telephone Encounter (Signed)
Patient called wanting him to see if she able to be release to have her surgery. She had to reschedule it due to blood clots in [phone]

## 2022-09-11 NOTE — Telephone Encounter (Signed)
Tried calling pt to advise, no answer and voice mail is full

## 2022-09-12 DIAGNOSIS — R002 Palpitations: Secondary | ICD-10-CM | POA: Diagnosis not present

## 2022-09-12 DIAGNOSIS — R0600 Dyspnea, unspecified: Secondary | ICD-10-CM | POA: Diagnosis not present

## 2022-09-12 DIAGNOSIS — I2699 Other pulmonary embolism without acute cor pulmonale: Secondary | ICD-10-CM | POA: Diagnosis not present

## 2022-09-12 NOTE — Telephone Encounter (Signed)
Called and advised, she stated understanding

## 2022-09-16 NOTE — Telephone Encounter (Signed)
Is this something that [name] or Social Work could help with?

## 2022-09-18 ENCOUNTER — Ambulatory Visit: Payer: Medicare Other | Admitting: Surgical

## 2022-09-18 ENCOUNTER — Telehealth: Payer: Self-pay | Admitting: *Deleted

## 2022-09-18 DIAGNOSIS — M7541 Impingement syndrome of right shoulder: Secondary | ICD-10-CM | POA: Diagnosis not present

## 2022-09-18 NOTE — Telephone Encounter (Signed)
Ortho bundle 1 year call completed. ?

## 2022-09-20 ENCOUNTER — Encounter: Payer: Self-pay | Admitting: Surgical

## 2022-09-20 MED ORDER — METHYLPREDNISOLONE ACETATE 40 MG/ML IJ SUSP
40.0000 mg | INTRAMUSCULAR | Status: AC | PRN
Start: 2022-09-18 — End: 2022-09-18
  Administered 2022-09-18: 40 mg via INTRA_ARTICULAR

## 2022-09-20 MED ORDER — LIDOCAINE HCL 1 % IJ SOLN
5.0000 mL | INTRAMUSCULAR | Status: AC | PRN
Start: 2022-09-18 — End: 2022-09-18
  Administered 2022-09-18: 5 mL

## 2022-09-20 MED ORDER — BUPIVACAINE HCL 0.5 % IJ SOLN
9.0000 mL | INTRAMUSCULAR | Status: AC | PRN
Start: 2022-09-18 — End: 2022-09-18
  Administered 2022-09-18: 9 mL via INTRA_ARTICULAR

## 2022-09-20 NOTE — Progress Notes (Signed)
Follow-up Office Visit Note   Patient: Stephanie Parks           Date of Birth: 1945-03-23           MRN: [id number] Visit Date: 09/18/2022 Requested by: Pearline Cables, MD [address] [place],  [place] [address] PCP: Pearline Cables, MD  Subjective: Chief Complaint  Patient presents with   Right Shoulder - Pain    HPI: Stephanie Parks is a 77 y.o. female who returns to the office for follow-up visit.    Plan at last visit was: Patient is a 77 year old female who presents for evaluation of right shoulder pain.  She has history of right shoulder pain has been ongoing for several months and she has seen Dr. Magnus Ivan with good relief for 2 months from prior injection that he did.  She would like to repeat this injection today.  She does have radiographs from late December demonstrating no significant degenerative changes or acute osseous abnormality.  She has some rotator cuff weakness on exam regarding infraspinatus but she still has functional range of motion and she does not have lag sign or Hornblower sign.  Also has impingement signs that are present on exam.  After discussion of options, she does not want to proceed with any sort of surgical intervention and she would like to repeat injection.  Subacromial injection administered and patient tolerated procedure well.  Follow-up with the office as needed.   She does have prior MRI of the right shoulder from August 2022 demonstrating severe tendinosis of the supraspinatus tendon with full-thickness tear with 9 mm of retraction.  She also has tendinosis of the infraspinatus tendon as well as moderate tendinosis of the long head of the bicep tendon and mild to moderate glenohumeral arthritis.  Since then, patient notes she had good relief from injection that lasted up until about a month ago.  No recent fall or injury.  Localizes pain to the lateral and superior aspect of the right shoulder.  Has some difficulty laying  on her right shoulder at night.  Also has some occasional sensation of weakness when she lifts objects away from her body.  Not bother her enough to consider surgical intervention at this time.  No radicular pain down the arm.  No scapular pain.              ROS: All systems reviewed are negative as they relate to the chief complaint within the history of present illness.  Patient denies fevers or chills.  Assessment & Plan: Visit Diagnoses:  1. Impingement syndrome of right shoulder     Plan: Stephanie Parks is a 77 y.o. female who returns to the office for follow-up visit for right shoulder.  Plan from last visit was noted above in HPI.  They now return with significant relief from subacromial injection on 05/06/2022 that lasted up until about a month ago.  She has some continued rotator cuff weakness that is consistent with her history of full-thickness tear of the supraspinatus tendon that was noted from MRI in August 2022.  Still not bothering her enough for any surgical intervention while these injections are lasting for about 4 to 5 months at a time.  She would like to repeat subacromial injection today and this was administered without complication.  She also has some new tenderness over the [facility] joint that was not present on the last exam in March which may be giving her some  increased pain as well.  If the subacromial injection has incomplete resolution of her pain this time, could consider her back into try ultrasound-guided [facility] joint injection.  Patient agreed with plan.  Follow-up as needed.  Follow-Up Instructions: No follow-ups on file.   Orders:  No orders of the defined types were placed in this encounter.  No orders of the defined types were placed in this encounter.     Procedures: Large Joint Inj: R subacromial bursa on 09/18/2022 9:09 AM Indications: diagnostic evaluation and pain Details: 18 G 1.5 in needle, posterior approach  Arthrogram: No  Medications: 9 mL  bupivacaine 0.5 %; 40 mg methylPREDNISolone acetate 40 MG/ML; 5 mL lidocaine 1 % Outcome: tolerated well, no immediate complications Procedure, treatment alternatives, risks and benefits explained, specific risks discussed. Consent was given by the patient. Immediately prior to procedure a time out was called to verify the correct patient, procedure, equipment, support staff and site/side marked as required. Patient was prepped and draped in the usual sterile fashion.       Clinical Data: No additional findings.  Objective: Vital Signs: There were no vitals taken for this visit.  Physical Exam:  Constitutional: Patient appears well-developed HEENT:  Head: Normocephalic Eyes:EOM are normal Neck: Normal range of motion Cardiovascular: Normal rate Pulmonary/chest: Effort normal Neurologic: Patient is alert Skin: Skin is warm Psychiatric: Patient has normal mood and affect  Ortho Exam: Ortho exam demonstrates right shoulder with about 50 degrees X rotation, 100 degrees abduction, 150 degrees forward elevation passively and actively.  She has external rotation strength rated 4/5 relative to 5/5 in the contralateral shoulder.  Negative external Tatian lag sign.  Negative Hornblower sign.  She has noted significant tenderness over the bicipital groove but she does have mild to moderate tenderness over the [facility] joint of the right shoulder that is asymmetric compared to contralateral side.  Axillary nerve intact with deltoid firing.  No cellulitis or skin changes noted.  Palpable radial pulse of the right upper extremity.  Specialty Comments:  No specialty comments available.  Imaging: No results found.   PMFS History: Patient Active Problem List   Diagnosis Date Noted   Multiple subsegmental pulmonary emboli without acute cor pulmonale (HCC) 07/23/2022   TIA (transient ischemic attack) 06/08/2022   Diastolic dysfunction 06/08/2022   Obesity (BMI 30-39.9) 06/08/2022   OSA (obstructive  sleep apnea) 06/08/2022   Unilateral primary osteoarthritis, right hip 05/29/2022   Status post total replacement of left hip 09/04/2021   DJD (degenerative joint disease) 09/04/2021   Iron deficiency anemia 03/13/2021   Erythropoietin deficiency anemia 03/13/2021   Unilateral primary osteoarthritis, left hip 10/18/2020   Greater trochanteric bursitis of left hip 10/18/2020   Headache 06/07/2020   Acute respiratory failure with hypoxia (HCC) 05/02/2020   Post-COVID chronic dyspnea 05/02/2020   Pulsatile neck mass 04/26/2020   Pneumonia due to COVID-19 virus 03/12/2020   Headache disorder 01/04/2020   Seasonal allergic conjunctivitis 10/04/2019   Dysfunction of right eustachian tube 10/04/2019   Pre-diabetes 06/02/2019   Seasonal and perennial allergic rhinitis 12/22/2018   Heart palpitations 12/22/2018   History of chest pain 07/20/2018   Brain aneurysm 07/20/2018   Dyslipidemia 05/04/2018   Tendonitis, Achilles, right 12/08/2017   Unilateral primary osteoarthritis, left knee 10/07/2017   Ingrown toenail 08/10/2017   Coughing 04/24/2017   CAD (coronary artery disease) 03/01/2015   Aneurysm (HCC) 03/01/2015   Dizziness 03/01/2015   Hypokalemia 03/01/2015   Paresthesia of arm 03/01/2015   Achalasia 09/28/2012  CN (constipation) 09/28/2012   Cephalalgia 08/24/2012   Essential hypertension 04/19/2011   Hyperlipidemia 04/19/2011   GERD (gastroesophageal reflux disease) 04/19/2011   Chest pain 04/04/2011   Past Medical History:  Diagnosis Date   Achalasia 09/28/2012   Allergic rhinitis 03/01/2015   Anemia    Aneurysm (HCC) 03/01/2015   stable 5 mm periopthalmic right ICA aneurysm 03/25/21 CTA   BP (high blood pressure) 03/01/2015   Bronchitis 04/23/2018   Cephalalgia 08/24/2012   Overview:  ICD-10 cut over     Chest pain 04/04/2011   Coronary Ca Score 0, no significant CAD 08/20/18; CP with troponin 4>32 s/p LHC showing normal coronaries 12/29/18   CN (constipation)  09/28/2012   Coughing 04/24/2017   Decreased potassium in the blood 03/01/2015   Diverticulosis    Dizziness 03/01/2015   Erythropoietin deficiency anemia 03/13/2021   GERD (gastroesophageal reflux disease)    Hiatal hernia    Hypercholesterolemia 03/01/2015   Hyperlipidemia    Hypertension    Ingrown toenail 08/10/2017   Inguinal hernia    Iron deficiency anemia 03/13/2021   Migraine headache    Onychomycosis 12/08/2017   Paresthesia of arm 03/01/2015   PE (pulmonary thromboembolism) (HCC) 03/13/2020   in setting of + COVID-19, s/p Eliquis x 3 month   Schatzki's ring    Tendonitis, Achilles, right 12/08/2017   Unilateral primary osteoarthritis, left knee 10/07/2017    Family History  Problem Relation Age of Onset   Allergic rhinitis Sister    Diabetes Sister    Hypertension Sister    Heart attack Father 63   Heart disease Father    Stomach cancer Maternal Grandmother    Heart disease Mother    Colon cancer Neg Hx    Angioedema Neg Hx    Asthma Neg Hx    Eczema Neg Hx    Urticaria Neg Hx    Immunodeficiency Neg Hx    Breast cancer Neg Hx    Migraines Neg Hx    Headache Neg Hx     Past Surgical History:  Procedure Laterality Date   ABDOMINAL HYSTERECTOMY     BACK SURGERY     x 3   CARDIAC CATHETERIZATION  12/29/2018   angiographically normal coronary arteries 12/29/18 ([place])   CARPAL TUNNEL RELEASE     left wrist   CERVICAL SPINE SURGERY     HEMORRHOID SURGERY     TOTAL HIP ARTHROPLASTY Left 09/04/2021   Procedure: LEFT TOTAL HIP ARTHROPLASTY ANTERIOR APPROACH;  Surgeon: Kathryne Hitch, MD;  Location: [facility] OR;  Service: Orthopedics;  Laterality: Left;   Social History   Occupational History   Occupation: Lobbyist: [employer]  Tobacco Use   Smoking status: Former    Current packs/day: 0.00    Average packs/day: 0.5 packs/day for 20.0 years (10.0 ttl pk-yrs)    Types: Cigarettes    Start date: 02/19/1964    Quit date: 02/19/1984     Years since quitting: 38.6   Smokeless tobacco: Never  Vaping Use   Vaping status: Never Used  Substance and Sexual Activity   Alcohol use: No   Drug use: No   Sexual activity: Yes    Partners: Male

## 2022-09-23 NOTE — Telephone Encounter (Signed)
I reviewed her chart and saw where the pt called ortho and asked about the procedure:   Kathryne Hitch, MD  to Hans Eden, RMA     09/11/22  1:17 PM Apparently she had blood clots with pulmonary embolus and is on blood thinning medication.  We will not be able to perform any type of surgery in the short-term due to her not being likely able to come off of blood thinning medications anytime soon.  We would need clearance from a pulmonologist or cardiologist before proceeding with surgery and she would have to be able to stop blood thinning medications for at least 3 days.

## 2022-09-26 ENCOUNTER — Telehealth (INDEPENDENT_AMBULATORY_CARE_PROVIDER_SITE_OTHER): Payer: Medicare Other | Admitting: Family Medicine

## 2022-09-26 DIAGNOSIS — I2694 Multiple subsegmental pulmonary emboli without acute cor pulmonale: Secondary | ICD-10-CM | POA: Diagnosis not present

## 2022-09-26 DIAGNOSIS — M1611 Unilateral primary osteoarthritis, right hip: Secondary | ICD-10-CM | POA: Diagnosis not present

## 2022-09-26 MED ORDER — TRAMADOL HCL 50 MG PO TABS
50.0000 mg | ORAL_TABLET | Freq: Three times a day (TID) | ORAL | 0 refills | Status: DC | PRN
Start: 2022-09-26 — End: 2023-08-29

## 2022-09-26 NOTE — Progress Notes (Signed)
[facility] [address], [address] [place], [place] [address] [phone]  Date:  09/26/2022   Name:  Stephanie Parks   DOB:  1945/09/26   MRN:  [id number]  PCP:  Pearline Cables, MD    Chief Complaint: Hip Pain   History of Present Illness:  Stephanie Parks is a 77 y.o. very pleasant female patient who presents with the following:  Patient seen today to discuss hip pain Visit changed to virtual today due to bad weather.  Patient location is her home, my location is office.  Patient identity confirmed with 2 factors, she gives consent for surgery today.  The patient and myself are present on the call today  She sees Dr. Magnus Ivan with orthopedics regarding right hip pain.  He saw her last in April; she has had a left hip replacement already with excellent result.  Per his note dated April 10:  On exam her right hip shows significant pain with internal and external rotation.  The left total hip moves smoothly with no pain at all. At this point we are recommending proceeding with a hip replacement on the right hip given the worsening pain combined with her clinical exam findings and MRI findings.  Also she has failed conservative treatment with even an intra-articular steroid injection as well as activity modification and hip strengthening exercises.  She will need to be out of work until further notice and then after surgery will likely need to be out of work for 6 to 12 weeks as she recovers from surgery.  We will work on getting this scheduled.  Unfortunately, in the interim Lekesha suffered a pulmonary embolism She had a PE in January 2022 after having COVID, treated for 6 months.  She then had a recurrent PE in May of this year, being treated with Eliquis. She is being managed by hematology through [facility] For the time being they do not recommend that she come off Eliquis even temporarily, so we are not able to proceed with  her hip surgery right now  She is using tylenol right now for her hip pain- it helps her some but not a lot -she is still in a lot of pain She is not able to use NSAIDs due to blood thinner  She works at [employer]- about 22 hours a week She has to stand to do her job-she would like to try going back since her hip surgery will be delayed.  She thinks she can manage her job  She is having a lot of trouble affording her Eliquis Patient Active Problem List   Diagnosis Date Noted   Multiple subsegmental pulmonary emboli without acute cor pulmonale (HCC) 07/23/2022   TIA (transient ischemic attack) 06/08/2022   Diastolic dysfunction 06/08/2022   Obesity (BMI 30-39.9) 06/08/2022   OSA (obstructive sleep apnea) 06/08/2022   Unilateral primary osteoarthritis, right hip 05/29/2022   Status post total replacement of left hip 09/04/2021   DJD (degenerative joint disease) 09/04/2021   Iron deficiency anemia 03/13/2021   Erythropoietin deficiency anemia 03/13/2021   Unilateral primary osteoarthritis, left hip 10/18/2020   Greater trochanteric bursitis of left hip 10/18/2020   Headache 06/07/2020   Acute respiratory failure with hypoxia (HCC) 05/02/2020   Post-COVID chronic dyspnea 05/02/2020   Pulsatile neck mass 04/26/2020   Pneumonia due to COVID-19 virus 03/12/2020   Headache disorder 01/04/2020   Seasonal allergic conjunctivitis 10/04/2019   Dysfunction of right  eustachian tube 10/04/2019   Pre-diabetes 06/02/2019   Seasonal and perennial allergic rhinitis 12/22/2018   Heart palpitations 12/22/2018   History of chest pain 07/20/2018   Brain aneurysm 07/20/2018   Dyslipidemia 05/04/2018   Tendonitis, Achilles, right 12/08/2017   Unilateral primary osteoarthritis, left knee 10/07/2017   Ingrown toenail 08/10/2017   Coughing 04/24/2017   CAD (coronary artery disease) 03/01/2015   Aneurysm (HCC) 03/01/2015   Dizziness 03/01/2015   Hypokalemia 03/01/2015   Paresthesia of arm 03/01/2015    Achalasia 09/28/2012   CN (constipation) 09/28/2012   Cephalalgia 08/24/2012   Essential hypertension 04/19/2011   Hyperlipidemia 04/19/2011   GERD (gastroesophageal reflux disease) 04/19/2011   Chest pain 04/04/2011    Past Medical History:  Diagnosis Date   Achalasia 09/28/2012   Allergic rhinitis 03/01/2015   Anemia    Aneurysm (HCC) 03/01/2015   stable 5 mm periopthalmic right ICA aneurysm 03/25/21 CTA   BP (high blood pressure) 03/01/2015   Bronchitis 04/23/2018   Cephalalgia 08/24/2012   Overview:  ICD-10 cut over     Chest pain 04/04/2011   Coronary Ca Score 0, no significant CAD 08/20/18; CP with troponin 4>32 s/p LHC showing normal coronaries 12/29/18   CN (constipation) 09/28/2012   Coughing 04/24/2017   Decreased potassium in the blood 03/01/2015   Diverticulosis    Dizziness 03/01/2015   Erythropoietin deficiency anemia 03/13/2021   GERD (gastroesophageal reflux disease)    Hiatal hernia    Hypercholesterolemia 03/01/2015   Hyperlipidemia    Hypertension    Ingrown toenail 08/10/2017   Inguinal hernia    Iron deficiency anemia 03/13/2021   Migraine headache    Onychomycosis 12/08/2017   Paresthesia of arm 03/01/2015   PE (pulmonary thromboembolism) (HCC) 03/13/2020   in setting of + COVID-19, s/p Eliquis x 3 month   Schatzki's ring    Tendonitis, Achilles, right 12/08/2017   Unilateral primary osteoarthritis, left knee 10/07/2017    Past Surgical History:  Procedure Laterality Date   ABDOMINAL HYSTERECTOMY     BACK SURGERY     x 3   CARDIAC CATHETERIZATION  12/29/2018   angiographically normal coronary arteries 12/29/18 ([place])   CARPAL TUNNEL RELEASE     left wrist   CERVICAL SPINE SURGERY     HEMORRHOID SURGERY     TOTAL HIP ARTHROPLASTY Left 09/04/2021   Procedure: LEFT TOTAL HIP ARTHROPLASTY ANTERIOR APPROACH;  Surgeon: Kathryne Hitch, MD;  Location: [facility] OR;  Service: Orthopedics;  Laterality: Left;    Social History   Tobacco Use    Smoking status: Former    Current packs/day: 0.00    Average packs/day: 0.5 packs/day for 20.0 years (10.0 ttl pk-yrs)    Types: Cigarettes    Start date: 02/19/1964    Quit date: 02/19/1984    Years since quitting: 38.6   Smokeless tobacco: Never  Vaping Use   Vaping status: Never Used  Substance Use Topics   Alcohol use: No   Drug use: No    Family History  Problem Relation Age of Onset   Allergic rhinitis Sister    Diabetes Sister    Hypertension Sister    Heart attack Father 61   Heart disease Father    Stomach cancer Maternal Grandmother    Heart disease Mother    Colon cancer Neg Hx    Angioedema Neg Hx    Asthma Neg Hx    Eczema Neg Hx    Urticaria Neg Hx    Immunodeficiency  Neg Hx    Breast cancer Neg Hx    Migraines Neg Hx    Headache Neg Hx     Allergies  Allergen Reactions   Shellfish Allergy Hives and Swelling   Ace Inhibitors Other (See Comments)    Pt cannot recall her reaction but tolerates arb    Gabapentin Other (See Comments)    headaches   Pitavastatin     Unknown reaction  Other Reaction(s): Other (See Comments)  ADVERSE REACTION,  Pt unaware of this reaction; states she has been on crestor in the past with no issues   Sulfa Antibiotics Other (See Comments) and Rash    Fine bumps   Topiramate Palpitations    Heart race,     Medication list has been reviewed and updated.  Current Outpatient Medications on File Prior to Visit  Medication Sig Dispense Refill   albuterol (VENTOLIN HFA) 108 (90 Base) MCG/ACT inhaler Inhale 2 puffs into the lungs every 6 (six) hours as needed for wheezing or shortness of breath. 1 each 2   apixaban (ELIQUIS) 5 MG TABS tablet Take 1 tablet (5 mg total) by mouth 2 (two) times daily. 60 tablet 5   aspirin EC 81 MG tablet Take 1 tablet (81 mg total) by mouth daily. Swallow whole. 30 tablet 0   Calcium Carb-Cholecalciferol (CALCIUM 600 + D PO) Take 1 tablet by mouth daily.     carboxymethylcellulose (REFRESH PLUS)  0.5 % SOLN Place 1 drop into both eyes 3 (three) times daily as needed (dry eyes).     carvedilol (COREG) 6.25 MG tablet Take 1 tablet (6.25 mg total) by mouth 2 (two) times daily with a meal. 60 tablet 3   Cyanocobalamin (B-12 PO) Take 1 tablet by mouth daily. (Patient not taking: Reported on 07/31/2022)     ELDERBERRY PO Take 1 capsule by mouth daily.     EPINEPHrine (EPIPEN 2-PAK) 0.3 mg/0.3 mL IJ SOAJ injection Inject 0.3 mg into the muscle as needed for anaphylaxis. Per allergen immunotherapy protocol 1 each 2   fluticasone (FLONASE) 50 MCG/ACT nasal spray Place 2 sprays into both nostrils daily as needed for allergies. 48 g 3   KLOR-CON M20 20 MEQ tablet TAKE 2 TABLETS BY MOUTH EVERY DAY 180 tablet 1   Multiple Vitamins-Minerals (ALIVE MULTI-VITAMIN PO) Take 1 tablet by mouth daily.     NON FORMULARY Pt uses a cpap nightly     nortriptyline (PAMELOR) 25 MG capsule Take 2 capsules (50 mg total) by mouth at bedtime. 180 capsule 3   omeprazole (PRILOSEC) 40 MG capsule Take 1 capsule (40 mg total) by mouth daily. 90 capsule 3   polyethylene glycol (MIRALAX) 17 g packet Take 17 g by mouth daily as needed for mild constipation or moderate constipation. 90 packet 2   rosuvastatin (CRESTOR) 20 MG tablet Take 1 tablet (20 mg total) by mouth daily. 30 tablet 0   spironolactone (ALDACTONE) 25 MG tablet Take 0.5 tablets (12.5 mg total) by mouth daily. 45 tablet 3   No current facility-administered medications on file prior to visit.    Review of Systems:  As per HPI- otherwise negative.   Physical Examination: There were no vitals filed for this visit. There were no vitals filed for this visit. There is no height or weight on file to calculate BMI. Ideal Body Weight:     Patient observed via video monitor, she looks well and her normal self  Assessment and Plan: Arthritis of right hip - Plan:  traMADol (ULTRAM) 50 MG tablet  Multiple subsegmental pulmonary emboli without acute cor  pulmonale (HCC)  Virtual visit today to discuss severe hip arthritis.  Orthopedics would like to do a hip replacement, but this is delayed right now while she undergoes acute treatment for pulmonary embolism.  She is not able to pause her blood thinner to have orthopedic surgery As such, we would like to control her pain to make her more comfortable-Tylenol is not adequate and she is unable to take NSAIDs  She has used tramadol and also hydrocodone in the past-she does not recall a problem with either of these medications  We decided to try tramadol, I encouraged her to take as little as she can to keep her pain under control.  Caution regarding sedation  We do not currently have any Eliquis 5 samples.  We will keep an eye out for this, I have reached out to our Pharm.D. to see if she has any ideas to help patient afford her medication She will bring in her RTW paperwork for me to complete next week- She would like to RTW on 8/26 Signed Abbe Amsterdam, MD

## 2022-09-27 ENCOUNTER — Other Ambulatory Visit: Payer: Medicare Other | Admitting: Pharmacist

## 2022-09-27 NOTE — Progress Notes (Signed)
09/27/2022 Name: Stephanie Parks MRN: [id number] DOB: [birth year]  Chief Complaint  Patient presents with   Medication Management    Stephanie Parks is a 77 y.o. year old female who presented for a telephone visit.   They were referred to the pharmacist by their PCP for assistance in managing medication access.    Subjective:  Medication Access/Adherence  Patient reports affordability concerns with their medications: Yes  -  patient states most of her meds are $0 or $10 copays except Eliquis is $47 / month which she states is too much.  Patient reports access/transportation concerns to their pharmacy: No  Patient reports adherence concerns with their medications:  No     Patient has [facility] plan and [place] Medicaid only family planning on file.   She is taking Eliquis for VTE prevention - she has had 2 PEs and is on long term anticoagulation.   Objective:  Lab Results  Component Value Date   HGBA1C 6.1 (H) 06/08/2022    Lab Results  Component Value Date   CREATININE 0.76 08/19/2022   BUN 12 08/19/2022   NA 141 08/19/2022   K 3.9 08/19/2022   CL 102 08/19/2022   CO2 31 08/19/2022    Lab Results  Component Value Date   CHOL 180 06/08/2022   HDL 67 06/08/2022   LDLCALC 101 (H) 06/08/2022   TRIG 62 06/08/2022   CHOLHDL 2.7 06/08/2022    Medications Reviewed Today     Reviewed by Henrene Pastor, RPH-CPP (Pharmacist) on 09/27/22 at 1209  Med List Status: <None>   Medication Order Taking? Sig Documenting Provider Last Dose Status Informant  albuterol (VENTOLIN HFA) 108 (90 Base) MCG/ACT inhaler [id number]  Inhale 2 puffs into the lungs every 6 (six) hours as needed for wheezing or shortness of breath. Copland, Gwenlyn Found, MD  Active   apixaban (ELIQUIS) 5 MG TABS tablet [id number] Yes Take 1 tablet (5 mg total) by mouth 2 (two) times daily. Copland, Gwenlyn Found, MD Taking Active   Calcium Carb-Cholecalciferol (CALCIUM 600 + D PO) [id number]  Take 1 tablet  by mouth daily. [provider]  Active Self, Pharmacy Records  carboxymethylcellulose (REFRESH PLUS) 0.5 % SOLN [id number]  Place 1 drop into both eyes 3 (three) times daily as needed (dry eyes). [provider]  Active Self, Pharmacy Records  carvedilol (COREG) 6.25 MG tablet [id number] Yes Take 1 tablet (6.25 mg total) by mouth 2 (two) times daily with a meal. Zola Button, Grayling Congress, DO Taking Active   Cyanocobalamin (B-12 PO) [id number] Yes Take 1 tablet by mouth daily. [provider] Taking Active Self, Pharmacy Records  ELDERBERRY PO [id number] Yes Take 1 capsule by mouth daily. [provider] Taking Active Self, Pharmacy Records  EPINEPHrine (EPIPEN 2-PAK) 0.3 mg/0.3 mL IJ SOAJ injection [id number]  Inject 0.3 mg into the muscle as needed for anaphylaxis. Per allergen immunotherapy protocol Copland, Gwenlyn Found, MD  Active Self, Pharmacy Records  fluticasone [facility]) 50 MCG/ACT nasal spray [id number]  Place 2 sprays into both nostrils daily as needed for allergies. Copland, Gwenlyn Found, MD  Active Self, Pharmacy Records  KLOR-CON M20 20 MEQ tablet [id number] Yes TAKE 2 TABLETS BY MOUTH EVERY DAY  Patient taking differently: Take 20 mEq by mouth daily.   Copland, Gwenlyn Found, MD Taking Active   Multiple Vitamins-Minerals (ALIVE MULTI-VITAMIN PO) [id number] Yes Take 1 tablet by mouth daily. [provider] Taking Active Self, Pharmacy Records  NON FORMULARY [id number] Yes  Pt uses a cpap nightly [provider] Taking Active Self, Pharmacy Records  nortriptyline (PAMELOR) 25 MG capsule [id number] Yes Take 2 capsules (50 mg total) by mouth at bedtime. Lomax, Amy, NP Taking Active Self, Pharmacy Records  omeprazole (PRILOSEC) 40 MG capsule [id number] Yes Take 1 capsule (40 mg total) by mouth daily. Copland, Gwenlyn Found, MD Taking Active   polyethylene glycol (MIRALAX) 17 g packet [id number] Yes Take 17 g by mouth daily as needed for mild constipation or moderate  constipation. Copland, Gwenlyn Found, MD Taking Active   rosuvastatin (CRESTOR) 20 MG tablet [id number] Yes Take 1 tablet (20 mg total) by mouth daily. Leroy Sea, MD Taking Active   spironolactone (ALDACTONE) 25 MG tablet [id number] Yes Take 0.5 tablets (12.5 mg total) by mouth daily. Copland, Gwenlyn Found, MD Taking Active   traMADol (ULTRAM) 50 MG tablet [id number] Yes Take 1 tablet (50 mg total) by mouth every 8 (eight) hours as needed. Copland, Gwenlyn Found, MD Taking Active               Assessment/Plan:   Medication Management: - Screened for LIS / Medicare Extra Help. Her yearly income is really close to the cut off but she might qualify. If she qualifies cost of Eliquis would decreased to $11.20 / month. Assisted patient with LIS application on line today. Usually get decision in about 3 weeks. Letter will be mailed to her home from Social security office.  - Patient has 2 weeks of Eliquis samples. Will check to see if we have 1 or 2 more weeks to get her thru until LIS decision is made.  - Since she has not reached the Medicare coverage gap yet, she is not eligible to apply for medication assistance program for Eliquis.  - Alternative to Eliquis would be: Xarelto (which also is $47 / month), Pradaxa or generic dabigatran (not on formulary and is $80 on GoodRx) or warfarin which would require monthly INR checks but cost is $0 copay.    Follow Up Plan: 3 weeks  Henrene Pastor, PharmD Clinical Pharmacist [facility]

## 2022-09-30 ENCOUNTER — Ambulatory Visit (INDEPENDENT_AMBULATORY_CARE_PROVIDER_SITE_OTHER): Payer: Medicare Other

## 2022-09-30 ENCOUNTER — Telehealth: Payer: Self-pay | Admitting: Family Medicine

## 2022-09-30 DIAGNOSIS — J309 Allergic rhinitis, unspecified: Secondary | ICD-10-CM | POA: Diagnosis not present

## 2022-09-30 NOTE — Telephone Encounter (Signed)
Pt dropped off paperwork for PCP to fill out. Pt states PCP will fill out on her lunch break. Papers placed in PCP's tray in front office.

## 2022-10-08 DIAGNOSIS — I671 Cerebral aneurysm, nonruptured: Secondary | ICD-10-CM | POA: Diagnosis not present

## 2022-10-12 ENCOUNTER — Other Ambulatory Visit: Payer: Self-pay | Admitting: Family Medicine

## 2022-10-12 DIAGNOSIS — I10 Essential (primary) hypertension: Secondary | ICD-10-CM

## 2022-10-17 ENCOUNTER — Encounter (HOSPITAL_COMMUNITY): Payer: Self-pay

## 2022-10-17 ENCOUNTER — Other Ambulatory Visit: Payer: Self-pay

## 2022-10-17 ENCOUNTER — Emergency Department (HOSPITAL_COMMUNITY)
Admission: EM | Admit: 2022-10-17 | Discharge: 2022-10-18 | Discharge status: Against Medical Advice | Payer: Medicare Other | Source: Home / Self Care

## 2022-10-17 ENCOUNTER — Ambulatory Visit (HOSPITAL_COMMUNITY)
Admission: EM | Admit: 2022-10-17 | Discharge: 2022-10-17 | Disposition: A | Discharge status: Home / Self Care | Payer: Medicare Other

## 2022-10-17 ENCOUNTER — Encounter (HOSPITAL_COMMUNITY): Payer: Self-pay | Admitting: Emergency Medicine

## 2022-10-17 DIAGNOSIS — R42 Dizziness and giddiness: Secondary | ICD-10-CM

## 2022-10-17 DIAGNOSIS — Z5321 Procedure and treatment not carried out due to patient leaving prior to being seen by health care provider: Secondary | ICD-10-CM | POA: Diagnosis not present

## 2022-10-17 DIAGNOSIS — I10 Essential (primary) hypertension: Secondary | ICD-10-CM | POA: Diagnosis not present

## 2022-10-17 DIAGNOSIS — R079 Chest pain, unspecified: Secondary | ICD-10-CM | POA: Diagnosis not present

## 2022-10-17 DIAGNOSIS — R110 Nausea: Secondary | ICD-10-CM | POA: Diagnosis not present

## 2022-10-17 LAB — CBC
HCT: 39.3 % (ref 36.0–46.0)
Hemoglobin: 12.1 g/dL (ref 12.0–15.0)
MCH: 28.5 pg (ref 26.0–34.0)
MCHC: 30.8 g/dL (ref 30.0–36.0)
MCV: 92.7 fL (ref 80.0–100.0)
Platelets: 267 10*3/uL (ref 150–400)
RBC: 4.24 MIL/uL (ref 3.87–5.11)
RDW: 14.3 % (ref 11.5–15.5)
WBC: 8.2 10*3/uL (ref 4.0–10.5)
nRBC: 0 % (ref 0.0–0.2)

## 2022-10-17 LAB — URINALYSIS, ROUTINE W REFLEX MICROSCOPIC
Bilirubin Urine: NEGATIVE
Glucose, UA: NEGATIVE mg/dL
Hgb urine dipstick: NEGATIVE
Ketones, ur: NEGATIVE mg/dL
Leukocytes,Ua: NEGATIVE
Nitrite: NEGATIVE
Protein, ur: NEGATIVE mg/dL
Specific Gravity, Urine: 1.016 (ref 1.005–1.030)
pH: 5 (ref 5.0–8.0)

## 2022-10-17 LAB — BASIC METABOLIC PANEL
Anion gap: 15 (ref 5–15)
BUN: 11 mg/dL (ref 8–23)
CO2: 26 mmol/L (ref 22–32)
Calcium: 9.6 mg/dL (ref 8.9–10.3)
Chloride: 99 mmol/L (ref 98–111)
Creatinine, Ser: 0.82 mg/dL (ref 0.44–1.00)
GFR, Estimated: >60 mL/min (ref >60)
Glucose, Bld: 94 mg/dL (ref 70–99)
Potassium: 3.6 mmol/L (ref 3.5–5.1)
Sodium: 140 mmol/L (ref 135–145)

## 2022-10-17 LAB — CBG MONITORING, ED: Glucose-Capillary: 75 mg/dL (ref 70–99)

## 2022-10-17 NOTE — ED Provider Notes (Signed)
[facility]    CSN: [id number] Arrival date & time: 10/17/22  1650      History   Chief Complaint Chief Complaint  Patient presents with   Chest Pain   Blood In Stools    HPI Stephanie Parks is a 77 y.o. female.  Sudden onset of feeling lightheaded.  She denies any dizziness or vertigo and cannot further describe the symptom.  She denies vision changes or loss of vision.  Not having headache She had a short episode of chest pain that resolved on its own.  History of hypertension, CAD, hyperlipidemia, migraine.  She had a TIA in April  Reports compliance with her medications and did take her BP meds today  Past Medical History:  Diagnosis Date   Achalasia 09/28/2012   Allergic rhinitis 03/01/2015   Anemia    Aneurysm (HCC) 03/01/2015   stable 5 mm periopthalmic right ICA aneurysm 03/25/21 CTA   BP (high blood pressure) 03/01/2015   Bronchitis 04/23/2018   Cephalalgia 08/24/2012   Overview:  ICD-10 cut over     Chest pain 04/04/2011   Coronary Ca Score 0, no significant CAD 08/20/18; CP with troponin 4>32 s/p LHC showing normal coronaries 12/29/18   CN (constipation) 09/28/2012   Coughing 04/24/2017   Decreased potassium in the blood 03/01/2015   Diverticulosis    Dizziness 03/01/2015   Erythropoietin deficiency anemia 03/13/2021   GERD (gastroesophageal reflux disease)    Hiatal hernia    Hypercholesterolemia 03/01/2015   Hyperlipidemia    Hypertension    Ingrown toenail 08/10/2017   Inguinal hernia    Iron deficiency anemia 03/13/2021   Migraine headache    Onychomycosis 12/08/2017   Paresthesia of arm 03/01/2015   PE (pulmonary thromboembolism) (HCC) 03/13/2020   in setting of + COVID-19, s/p Eliquis x 3 month   Schatzki's ring    Tendonitis, Achilles, right 12/08/2017   Unilateral primary osteoarthritis, left knee 10/07/2017    Patient Active Problem List   Diagnosis Date Noted   Multiple subsegmental pulmonary emboli without acute cor  pulmonale (HCC) 07/23/2022   TIA (transient ischemic attack) 06/08/2022   Diastolic dysfunction 06/08/2022   Obesity (BMI 30-39.9) 06/08/2022   OSA (obstructive sleep apnea) 06/08/2022   Unilateral primary osteoarthritis, right hip 05/29/2022   Status post total replacement of left hip 09/04/2021   DJD (degenerative joint disease) 09/04/2021   Iron deficiency anemia 03/13/2021   Erythropoietin deficiency anemia 03/13/2021   Unilateral primary osteoarthritis, left hip 10/18/2020   Greater trochanteric bursitis of left hip 10/18/2020   Headache 06/07/2020   Acute respiratory failure with hypoxia (HCC) 05/02/2020   Post-COVID chronic dyspnea 05/02/2020   Pulsatile neck mass 04/26/2020   Pneumonia due to COVID-19 virus 03/12/2020   Headache disorder 01/04/2020   Seasonal allergic conjunctivitis 10/04/2019   Dysfunction of right eustachian tube 10/04/2019   Pre-diabetes 06/02/2019   Seasonal and perennial allergic rhinitis 12/22/2018   Heart palpitations 12/22/2018   History of chest pain 07/20/2018   Brain aneurysm 07/20/2018   Dyslipidemia 05/04/2018   Tendonitis, Achilles, right 12/08/2017   Unilateral primary osteoarthritis, left knee 10/07/2017   Ingrown toenail 08/10/2017   Coughing 04/24/2017   CAD (coronary artery disease) 03/01/2015   Aneurysm (HCC) 03/01/2015   Dizziness 03/01/2015   Hypokalemia 03/01/2015   Paresthesia of arm 03/01/2015   Achalasia 09/28/2012   CN (constipation) 09/28/2012   Cephalalgia 08/24/2012   Essential hypertension 04/19/2011   Hyperlipidemia 04/19/2011   GERD (gastroesophageal reflux disease) 04/19/2011  Chest pain 04/04/2011    Past Surgical History:  Procedure Laterality Date   ABDOMINAL HYSTERECTOMY     BACK SURGERY     x 3   CARDIAC CATHETERIZATION  12/29/2018   angiographically normal coronary arteries 12/29/18 ([place])   CARPAL TUNNEL RELEASE     left wrist   CERVICAL SPINE SURGERY     HEMORRHOID SURGERY     TOTAL HIP  ARTHROPLASTY Left 09/04/2021   Procedure: LEFT TOTAL HIP ARTHROPLASTY ANTERIOR APPROACH;  Surgeon: Kathryne Hitch, MD;  Location: [facility];  Service: Orthopedics;  Laterality: Left;    OB History   No obstetric history on file.      Home Medications    Prior to Admission medications   Medication Sig Start Date End Date Taking? Authorizing Provider  albuterol (VENTOLIN HFA) 108 (90 Base) MCG/ACT inhaler Inhale 2 puffs into the lungs every 6 (six) hours as needed for wheezing or shortness of breath. 08/19/22   Copland, Gwenlyn Found, MD  apixaban (ELIQUIS) 5 MG TABS tablet Take 1 tablet (5 mg total) by mouth 2 (two) times daily. 08/19/22   Copland, Gwenlyn Found, MD  Calcium Carb-Cholecalciferol (CALCIUM 600 + D PO) Take 1 tablet by mouth daily.    [provider]  carboxymethylcellulose (REFRESH PLUS) 0.5 % SOLN Place 1 drop into both eyes 3 (three) times daily as needed (dry eyes).    [provider]  carvedilol (COREG) 6.25 MG tablet Take 1 tablet (6.25 mg total) by mouth 2 (two) times daily with a meal. 10/14/22   Copland, Gwenlyn Found, MD  Cyanocobalamin (B-12 PO) Take 1 tablet by mouth daily.    [provider]  ELDERBERRY PO Take 1 capsule by mouth daily.    [provider]  EPINEPHrine (EPIPEN 2-PAK) 0.3 mg/0.3 mL IJ SOAJ injection Inject 0.3 mg into the muscle as needed for anaphylaxis. Per allergen immunotherapy protocol 09/27/21   Copland, Gwenlyn Found, MD  fluticasone (FLONASE) 50 MCG/ACT nasal spray Place 2 sprays into both nostrils daily as needed for allergies. 04/11/21   Copland, Gwenlyn Found, MD  KLOR-CON M20 20 MEQ tablet TAKE 2 TABLETS BY MOUTH EVERY DAY Patient taking differently: Take 20 mEq by mouth daily. 07/22/22   Copland, Gwenlyn Found, MD  Multiple Vitamins-Minerals (ALIVE MULTI-VITAMIN PO) Take 1 tablet by mouth daily.    [provider]  NON FORMULARY Pt uses a cpap nightly    [provider]  nortriptyline (PAMELOR) 25 MG capsule  Take 2 capsules (50 mg total) by mouth at bedtime. 03/21/22   Lomax, Amy, NP  omeprazole (PRILOSEC) 40 MG capsule Take 1 capsule (40 mg total) by mouth daily. 08/19/22   Copland, Gwenlyn Found, MD  polyethylene glycol (MIRALAX) 17 g packet Take 17 g by mouth daily as needed for mild constipation or moderate constipation. 07/01/22   Copland, Gwenlyn Found, MD  rosuvastatin (CRESTOR) 20 MG tablet Take 1 tablet (20 mg total) by mouth daily. 06/09/22   Leroy Sea, MD  spironolactone (ALDACTONE) 25 MG tablet Take 0.5 tablets (12.5 mg total) by mouth daily. 08/19/22   Copland, Gwenlyn Found, MD  traMADol (ULTRAM) 50 MG tablet Take 1 tablet (50 mg total) by mouth every 8 (eight) hours as needed. 09/26/22   Copland, Gwenlyn Found, MD    Family History Family History  Problem Relation Age of Onset   Allergic rhinitis Sister    Diabetes Sister    Hypertension Sister    Heart attack Father 55  Heart disease Father    Stomach cancer Maternal Grandmother    Heart disease Mother    Colon cancer Neg Hx    Angioedema Neg Hx    Asthma Neg Hx    Eczema Neg Hx    Urticaria Neg Hx    Immunodeficiency Neg Hx    Breast cancer Neg Hx    Migraines Neg Hx    Headache Neg Hx     Social History Social History   Tobacco Use   Smoking status: Former    Current packs/day: 0.00    Average packs/day: 0.5 packs/day for 20.0 years (10.0 ttl pk-yrs)    Types: Cigarettes    Start date: 02/19/1964    Quit date: 02/19/1984    Years since quitting: 38.6   Smokeless tobacco: Never  Vaping Use   Vaping status: Never Used  Substance Use Topics   Alcohol use: No   Drug use: No     Allergies   Shellfish allergy, Ace inhibitors, Gabapentin, Pitavastatin, Sulfa antibiotics, and Topiramate   Review of Systems Review of Systems Per HPI  Physical Exam Triage Vital Signs ED Triage Vitals  Encounter Vitals Group     BP 10/17/22 1823 (!) 179/94     Systolic BP Percentile --      Diastolic BP Percentile --      Pulse Rate  10/17/22 1823 86     Resp 10/17/22 1823 16     Temp 10/17/22 1823 98.5 F (36.9 C)     Temp Source 10/17/22 1823 Oral     SpO2 10/17/22 1823 93 %     Weight --      Height --      Head Circumference --      Peak Flow --      Pain Score 10/17/22 1826 0     Pain Loc --      Pain Education --      Exclude from Growth Chart --    No data found.  Updated Vital Signs BP (!) 179/94 (BP Location: Right Arm)   Pulse 86   Temp 98.5 F (36.9 C) (Oral)   Resp 16   SpO2 93%    Physical Exam Vitals and nursing note reviewed.  Constitutional:      General: She is not in acute distress.    Appearance: Normal appearance. She is not ill-appearing or diaphoretic.  HENT:     Mouth/Throat:     Mouth: Mucous membranes are moist.     Pharynx: Oropharynx is clear.  Eyes:     Conjunctiva/sclera: Conjunctivae normal.  Cardiovascular:     Rate and Rhythm: Normal rate and regular rhythm.     Heart sounds: Normal heart sounds.  Pulmonary:     Effort: Pulmonary effort is normal.     Breath sounds: Normal breath sounds.  Neurological:     General: No focal deficit present.     Mental Status: She is alert and oriented to person, place, and time.      UC Treatments / Results  Labs (all labs ordered are listed, but only abnormal results are displayed) Labs Reviewed - No data to display  EKG   Radiology No results found.  Procedures Procedures (including critical care time)  Medications Ordered in UC Medications - No data to display  Initial Impression / Assessment and Plan / UC Course  I have reviewed the triage vital signs and the nursing notes.  Pertinent labs & imaging results that were available  during my care of the patient were reviewed by me and considered in my medical decision making (see chart for details).  EKG normal sinus with ventricular rate of 81 bpm.  No concerning ST or T wave changes.  She is not currently having chest pain; this was a brief symptom that  self-resolved  However she is experiencing lightheadedness.  She cannot further describe the symptom but feels something is wrong.  Her blood pressure is elevated 179/94. With her history, age, and sudden onset of symptoms I have advised her to be evaluated in the emergency department.  Sent to the ED for higher level of care  Final Clinical Impressions(s) / UC Diagnoses   Final diagnoses:  Light headed  Hypertension, unspecified type     Discharge Instructions      Sent to ED for higher level of care    ED Prescriptions   None    PDMP not reviewed this encounter.   Kathrine Haddock 10/17/22 0272

## 2022-10-17 NOTE — Discharge Instructions (Addendum)
Sent to ED for higher level of care 

## 2022-10-17 NOTE — ED Triage Notes (Signed)
Pt presents with intermittent chest pain and light headedness.  Pt was seen at [facility] and sent here for eval. Pt states she has "blood clots in her chest" earlier in the year and has been on eliquis but hasn't been rechecked.  Pt reports left sided CP that does not radiate. No shob but does state she mild nausea.

## 2022-10-17 NOTE — ED Notes (Signed)
Patient is being discharged from the Urgent Care and sent to the Emergency Department via POV . Per Autumn Patty., PA, patient is in need of higher level of care due to chest pain and history. Patient is aware and verbalizes understanding of plan of care.  Vitals:   10/17/22 1823  BP: (!) 179/94  Pulse: 86  Resp: 16  Temp: 98.5 F (36.9 C)  SpO2: 93%

## 2022-10-17 NOTE — ED Triage Notes (Addendum)
Patient c/o intermittent left chest pain approx 3 hours ago. Patient also c/o lightheadedness.  Patient states she is not currently having chest pain.  Patient also reports that she saw bright red blood on the toilet paper after having a BM this AM.

## 2022-10-18 ENCOUNTER — Other Ambulatory Visit: Payer: Medicare Other | Admitting: Pharmacist

## 2022-10-18 NOTE — ED Notes (Signed)
Pt states that she has work at 6 and she is leaving

## 2022-10-18 NOTE — Progress Notes (Signed)
10/18/2022 Name: Stephanie Parks MRN: [id number] DOB: [birth year]  No chief complaint on file.   Stephanie Parks is a 77 y.o. year old female who presented for a telephone visit.   They were referred to the pharmacist by their PCP for assistance in managing medication access.    Subjective:  Medication Access/Adherence  Patient reports affordability concerns with their medications: Yes  -  patient states most of her meds are $0 or $10 copays except Eliquis is $47 / month which she states is too much.  Applied for LIS 09/27/2022. Patient states she has not received letter from [employer] with decision yet.   Patient reports access/transportation concerns to their pharmacy: No  Patient reports adherence concerns with their medications:  No     Patient has [facility] plan and [place] Medicaid only family planning on file.   She is taking Eliquis for VTE prevention - she has had 2 PEs and is on long term anticoagulation. She reports she has 1 week of Eliquis left.   Patient presented to Urgent Care yesterday 10/17/2022 for lightheadedness. Blood pressure on arrival was 179/94 and pulse 86. EKG normal. Urgent care recommended she be evaluated at ED 10/17/2022 - ED Visit. Arrived at 7:28pm. Left at 3:12am on 8/30 - stated that she had work at [employer]. All labs were WNL - maybe a little low blood glucose at 75 - had not eaten since 7am 08/29.   Patient reports during our visit she is feeling better. Has not had recurrence of lightheadedness. Blood pressure at home this morning was 135/74. HR was 102   Objective:  Lab Results  Component Value Date   HGBA1C 6.1 (H) 06/08/2022    Lab Results  Component Value Date   CREATININE 0.82 10/17/2022   BUN 11 10/17/2022   NA 140 10/17/2022   K 3.6 10/17/2022   CL 99 10/17/2022   CO2 26 10/17/2022    Lab Results  Component Value Date   CHOL 180 06/08/2022   HDL 67 06/08/2022   LDLCALC 101 (H) 06/08/2022   TRIG 62  06/08/2022   CHOLHDL 2.7 06/08/2022    Medications Reviewed Today   Medications were not reviewed in this encounter       Assessment/Plan:   Medication Management: - checked with [facility] - patient's Eliquis is still $47 which means either her LIS application has not been reviewed yet or she was denied LIS. Patient to notify me when she receives letter from [employer]. - Patient has 1 week of Eliquis. Will check to see if we have 1 or 2 more weeks to get her thru until LIS decision is received. - Since she has not reached the Medicare coverage gap yet, she is not eligible to apply for medication assistance program for Eliquis.  - Alternatives to Eliquis would be: Xarelto (which also is $47 / month), Pradaxa or generic dabigatran (not on formulary and is $80 on GoodRx) or warfarin which would require monthly INR checks but cost is $0 copay.  Hypertension: blood pressure elevated during Urgent care visit but better at home today.  - Patient to come into office 10/28/2022 for blood pressure check. - continue to check blood pressure at home daily and bring blood pressure log and blood pressure monitor with her to appointment.  - continue carvedilol 6.25mg  twice a day for now. May increase in future if blood pressure and HR still elevated.   Follow Up Plan: in office visit 10/28/2022  Henrene Pastor, PharmD Clinical Pharmacist [facility]

## 2022-10-23 NOTE — Telephone Encounter (Signed)
Called patient to check on her after earlier messages of blood in stool.  She states this has resolved, she feels fine and will see me tomorrow.  I advised her to go to the ER should she have any further symptoms or any significant bleeding tonight.  She states agreement

## 2022-10-24 ENCOUNTER — Ambulatory Visit (INDEPENDENT_AMBULATORY_CARE_PROVIDER_SITE_OTHER): Payer: Medicare Other

## 2022-10-24 ENCOUNTER — Ambulatory Visit (HOSPITAL_BASED_OUTPATIENT_CLINIC_OR_DEPARTMENT_OTHER)
Admission: RE | Admit: 2022-10-24 | Discharge: 2022-10-24 | Disposition: A | Discharge status: Home / Self Care | Payer: Medicare Other | Source: Ambulatory Visit | Attending: Family Medicine | Admitting: Family Medicine

## 2022-10-24 ENCOUNTER — Ambulatory Visit (INDEPENDENT_AMBULATORY_CARE_PROVIDER_SITE_OTHER): Payer: Medicare Other | Admitting: Family Medicine

## 2022-10-24 VITALS — BP 124/80 | HR 62 | Temp 97.6°F | Resp 18 | Ht 64.0 in | Wt 228.0 lb

## 2022-10-24 DIAGNOSIS — K625 Hemorrhage of anus and rectum: Secondary | ICD-10-CM | POA: Diagnosis not present

## 2022-10-24 DIAGNOSIS — I2694 Multiple subsegmental pulmonary emboli without acute cor pulmonale: Secondary | ICD-10-CM | POA: Diagnosis not present

## 2022-10-24 DIAGNOSIS — J309 Allergic rhinitis, unspecified: Secondary | ICD-10-CM | POA: Diagnosis not present

## 2022-10-24 DIAGNOSIS — R0602 Shortness of breath: Secondary | ICD-10-CM | POA: Diagnosis not present

## 2022-10-24 DIAGNOSIS — K449 Diaphragmatic hernia without obstruction or gangrene: Secondary | ICD-10-CM | POA: Diagnosis not present

## 2022-10-24 LAB — COMPREHENSIVE METABOLIC PANEL
ALT: 11 U/L (ref 0–35)
AST: 16 U/L (ref 0–37)
Albumin: 3.5 g/dL (ref 3.5–5.2)
Alkaline Phosphatase: 78 U/L (ref 39–117)
BUN: 11 mg/dL (ref 6–23)
CO2: 33 meq/L — ABNORMAL HIGH (ref 19–32)
Calcium: 9.3 mg/dL (ref 8.4–10.5)
Chloride: 103 meq/L (ref 96–112)
Creatinine, Ser: 0.87 mg/dL (ref 0.40–1.20)
GFR: 64.44 mL/min (ref >60.00)
Glucose, Bld: 88 mg/dL (ref 70–99)
Potassium: 4.1 meq/L (ref 3.5–5.1)
Sodium: 140 meq/L (ref 135–145)
Total Bilirubin: 0.3 mg/dL (ref 0.2–1.2)
Total Protein: 6.7 g/dL (ref 6.0–8.3)

## 2022-10-24 LAB — CBC
HCT: 34.9 % — ABNORMAL LOW (ref 36.0–46.0)
Hemoglobin: 11.4 g/dL — ABNORMAL LOW (ref 12.0–15.0)
MCHC: 32.7 g/dL (ref 30.0–36.0)
MCV: 88.2 fl (ref 78.0–100.0)
Platelets: 303 10*3/uL (ref 150.0–400.0)
RBC: 3.95 Mil/uL (ref 3.87–5.11)
RDW: 15.1 % (ref 11.5–15.5)
WBC: 6.5 10*3/uL (ref 4.0–10.5)

## 2022-10-24 LAB — FECAL OCCULT BLOOD, IMMUNOCHEMICAL: NORMAL RANGE:: NEGATIVE

## 2022-10-24 LAB — TROPONIN I (HIGH SENSITIVITY): High Sens Troponin I: 4 ng/L (ref 2–17)

## 2022-10-24 MED ORDER — NORTRIPTYLINE HCL 25 MG PO CAPS: 50.0000 mg | ORAL_CAPSULE | Freq: Every day | ORAL | 1 refills | Status: DC

## 2022-10-24 MED ORDER — IOHEXOL 350 MG/ML SOLN
100.0000 mL | Freq: Once | INTRAVENOUS | Status: AC | PRN
  Administered 2022-10-24: 80 mL via INTRAVENOUS

## 2022-10-24 NOTE — Progress Notes (Signed)
[facility] [address], [address] [place], [place] [address] [phone]  Date:  10/24/2022   Name:  Stephanie Parks   DOB:  1945/03/03   MRN:  [id number]  PCP:  Pearline Cables, MD    Chief Complaint: blood in the stool (1. Started yesterday 10/23/22. Pt has some constipation, but was not straining. She noticed the blood in the tissue and in the toilette. 2. SOB x 1 week. With exertion. )   History of Present Illness:  ITHZEL Parks is a 77 y.o. very pleasant female patient who presents with the following:  Pt seen today with concern of blood in the stool-she reached out to me via MyChart yesterday and we scheduled this appointment  History of hypertension, hyperlipidemia, allergic rhinitis, OSA on CPAP. She has tended to have frequent ER visits for chest pain likely related to anxiety. She also had COVID-19 complicated by pulmonary embolism January of 2022 -She has completed treatment with Eliquis and is no longer using oxygen.  Lumbar decompression 1998  She had a PE in January 2022 after having COVID, treated for 6 months.  She then had a recurrent PE in May of this year, being treated with Eliquis again. She is being managed by hematology through [facility] The plan is for indefinite anticoagulation at this point  Yesterday am pt had a BM and noted BRBPR on the TP and in the bowl. She thought to go to the ER but then changed her mind. She had another BM yesterday afternoon and again this am and no blood seen No abdominal pain, no other bleeding No straining or pain with bowel movement She did have surgery for hemorrhoids in 1980- has not bothered her since then   Most recent colon 2020 Negative FOBT on several occasions over the last 3 years  Also, she notes SOB that seemed to come back a week ago; No chest pain She is complaint with her eliquis; she is seeing [name] our PharmD next week  Most recent CTA was done  07/13/22 at [facility]  HISTORY: Dyspnea. Syncope. Pneumonia  FINDINGS: Study mildly degraded due to technical factors.  Cardiac size within normal limits. A few scattered vascular calcifications. There are bilateral pulmonary emboli. This involves bilateral lobar, segmental and distal branches. There are changes suggestive of mild right heart strain. No acute infiltrate or effusion. Upper abdomen unremarkable. No acute bony abnormality identified.  IMPRESSION:  1. Bilateral pulmonary emboli, moderate burden. There are changes suggestive of mild right heart strain.  2. No acute infiltrate or effusion.   She was in the ER with chest ain on 8/30- looks like she left after labs and triage as she needed to get to her job   Patient Active Problem List   Diagnosis Date Noted   Multiple subsegmental pulmonary emboli without acute cor pulmonale (HCC) 07/23/2022   TIA (transient ischemic attack) 06/08/2022   Diastolic dysfunction 06/08/2022   Obesity (BMI 30-39.9) 06/08/2022   OSA (obstructive sleep apnea) 06/08/2022   Unilateral primary osteoarthritis, right hip 05/29/2022   Status post total replacement of left hip 09/04/2021   DJD (degenerative joint disease) 09/04/2021   Iron deficiency anemia 03/13/2021   Erythropoietin deficiency anemia 03/13/2021   Unilateral primary osteoarthritis, left hip 10/18/2020   Greater trochanteric bursitis of left hip 10/18/2020   Headache 06/07/2020   Acute respiratory failure with hypoxia (HCC) 05/02/2020   Post-COVID chronic dyspnea 05/02/2020  Pulsatile neck mass 04/26/2020   Pneumonia due to COVID-19 virus 03/12/2020   Headache disorder 01/04/2020   Seasonal allergic conjunctivitis 10/04/2019   Dysfunction of right eustachian tube 10/04/2019   Pre-diabetes 06/02/2019   Seasonal and perennial allergic rhinitis 12/22/2018   Heart palpitations 12/22/2018   History of chest pain 07/20/2018   Brain aneurysm 07/20/2018   Dyslipidemia 05/04/2018    Tendonitis, Achilles, right 12/08/2017   Unilateral primary osteoarthritis, left knee 10/07/2017   Ingrown toenail 08/10/2017   Coughing 04/24/2017   CAD (coronary artery disease) 03/01/2015   Aneurysm (HCC) 03/01/2015   Dizziness 03/01/2015   Hypokalemia 03/01/2015   Paresthesia of arm 03/01/2015   Achalasia 09/28/2012   CN (constipation) 09/28/2012   Cephalalgia 08/24/2012   Essential hypertension 04/19/2011   Hyperlipidemia 04/19/2011   GERD (gastroesophageal reflux disease) 04/19/2011   Chest pain 04/04/2011    Past Medical History:  Diagnosis Date   Achalasia 09/28/2012   Allergic rhinitis 03/01/2015   Anemia    Aneurysm (HCC) 03/01/2015   stable 5 mm periopthalmic right ICA aneurysm 03/25/21 CTA   BP (high blood pressure) 03/01/2015   Bronchitis 04/23/2018   Cephalalgia 08/24/2012   Overview:  ICD-10 cut over     Chest pain 04/04/2011   Coronary Ca Score 0, no significant CAD 08/20/18; CP with troponin 4>32 s/p LHC showing normal coronaries 12/29/18   CN (constipation) 09/28/2012   Coughing 04/24/2017   Decreased potassium in the blood 03/01/2015   Diverticulosis    Dizziness 03/01/2015   Erythropoietin deficiency anemia 03/13/2021   GERD (gastroesophageal reflux disease)    Hiatal hernia    Hypercholesterolemia 03/01/2015   Hyperlipidemia    Hypertension    Ingrown toenail 08/10/2017   Inguinal hernia    Iron deficiency anemia 03/13/2021   Migraine headache    Onychomycosis 12/08/2017   Paresthesia of arm 03/01/2015   PE (pulmonary thromboembolism) (HCC) 03/13/2020   in setting of + COVID-19, s/p Eliquis x 3 month   Schatzki's ring    Tendonitis, Achilles, right 12/08/2017   Unilateral primary osteoarthritis, left knee 10/07/2017    Past Surgical History:  Procedure Laterality Date   ABDOMINAL HYSTERECTOMY     BACK SURGERY     x 3   CARDIAC CATHETERIZATION  12/29/2018   angiographically normal coronary arteries 12/29/18 ([place])   CARPAL TUNNEL  RELEASE     left wrist   CERVICAL SPINE SURGERY     HEMORRHOID SURGERY     TOTAL HIP ARTHROPLASTY Left 09/04/2021   Procedure: LEFT TOTAL HIP ARTHROPLASTY ANTERIOR APPROACH;  Surgeon: Kathryne Hitch, MD;  Location: [facility] OR;  Service: Orthopedics;  Laterality: Left;    Social History   Tobacco Use   Smoking status: Former    Current packs/day: 0.00    Average packs/day: 0.5 packs/day for 20.0 years (10.0 ttl pk-yrs)    Types: Cigarettes    Start date: 02/19/1964    Quit date: 02/19/1984    Years since quitting: 38.7   Smokeless tobacco: Never  Vaping Use   Vaping status: Never Used  Substance Use Topics   Alcohol use: No   Drug use: No    Family History  Problem Relation Age of Onset   Allergic rhinitis Sister    Diabetes Sister    Hypertension Sister    Heart attack Father 37   Heart disease Father    Stomach cancer Maternal Grandmother    Heart disease Mother    Colon cancer Neg Hx  Angioedema Neg Hx    Asthma Neg Hx    Eczema Neg Hx    Urticaria Neg Hx    Immunodeficiency Neg Hx    Breast cancer Neg Hx    Migraines Neg Hx    Headache Neg Hx     Allergies  Allergen Reactions   Shellfish Allergy Hives and Swelling   Ace Inhibitors Other (See Comments)    Pt cannot recall her reaction but tolerates arb    Gabapentin Other (See Comments)    headaches   Pitavastatin     Unknown reaction  Other Reaction(s): Other (See Comments)  ADVERSE REACTION,  Pt unaware of this reaction; states she has been on crestor in the past with no issues   Sulfa Antibiotics Other (See Comments) and Rash    Fine bumps   Topiramate Palpitations    Heart race,     Medication list has been reviewed and updated.  Current Outpatient Medications on File Prior to Visit  Medication Sig Dispense Refill   Acetaminophen Extra Strength 500 MG TABS Take 1-2 tablets by mouth every 8 (eight) hours as needed (pain, headache, fever).     albuterol (VENTOLIN HFA) 108 (90 Base) MCG/ACT  inhaler Inhale 2 puffs into the lungs every 6 (six) hours as needed for wheezing or shortness of breath. 1 each 2   apixaban (ELIQUIS) 5 MG TABS tablet Take 1 tablet (5 mg total) by mouth 2 (two) times daily. 60 tablet 5   Calcium Carb-Cholecalciferol (CALCIUM 600 + D PO) Take 1 tablet by mouth daily.     carboxymethylcellulose (REFRESH PLUS) 0.5 % SOLN Place 1 drop into both eyes 3 (three) times daily as needed (dry eyes).     carvedilol (COREG) 6.25 MG tablet Take 1 tablet (6.25 mg total) by mouth 2 (two) times daily with a meal. 180 tablet 0   Cyanocobalamin (B-12 PO) Take 1 tablet by mouth daily.     ELDERBERRY PO Take 1 capsule by mouth daily.     EPINEPHrine (EPIPEN 2-PAK) 0.3 mg/0.3 mL IJ SOAJ injection Inject 0.3 mg into the muscle as needed for anaphylaxis. Per allergen immunotherapy protocol 1 each 2   fluticasone (FLONASE) 50 MCG/ACT nasal spray Place 2 sprays into both nostrils daily as needed for allergies. 48 g 3   KLOR-CON M20 20 MEQ tablet TAKE 2 TABLETS BY MOUTH EVERY DAY (Patient taking differently: Take 20 mEq by mouth daily.) 180 tablet 1   Multiple Vitamins-Minerals (ALIVE MULTI-VITAMIN PO) Take 1 tablet by mouth daily.     NON FORMULARY Pt uses a cpap nightly     nortriptyline (PAMELOR) 25 MG capsule Take 2 capsules (50 mg total) by mouth at bedtime. 180 capsule 3   omeprazole (PRILOSEC) 40 MG capsule Take 1 capsule (40 mg total) by mouth daily. 90 capsule 3   polyethylene glycol (MIRALAX) 17 g packet Take 17 g by mouth daily as needed for mild constipation or moderate constipation. 90 packet 2   rosuvastatin (CRESTOR) 20 MG tablet Take 1 tablet (20 mg total) by mouth daily. 30 tablet 0   spironolactone (ALDACTONE) 25 MG tablet Take 0.5 tablets (12.5 mg total) by mouth daily. 45 tablet 3   traMADol (ULTRAM) 50 MG tablet Take 1 tablet (50 mg total) by mouth every 8 (eight) hours as needed. 60 tablet 0   No current facility-administered medications on file prior to visit.     Review of Systems:  As per HPI- otherwise negative.   Physical Examination:  Vitals:   10/24/22 1011  BP: 124/80  Pulse: 62  Resp: 18  Temp: 97.6 F (36.4 C)  SpO2: 99%   Vitals:   10/24/22 1011  Weight: 228 lb (103.4 kg)  Height: 5\' 4"  (1.626 m)   Body mass index is 39.14 kg/m. Ideal Body Weight: Weight in (lb) to have BMI = 25: 145.3  GEN: no acute distress. Obese, looks well  HEENT: Atraumatic, Normocephalic.  Ears and Nose: No external deformity. CV: RRR, No M/G/R. No JVD. No thrill. No extra heart sounds. PULM: CTA B, no wheezes, crackles, rhonchi. No retractions. No resp. distress. No accessory muscle use. ABD: S, NT, ND, +BS. No rebound. No HSM.  Belly is benign EXTR: No c/c/e PSYCH: Normally interactive. Conversant.  Brown stool at anus, no sign of gross blood.  DRE normal-no pain or visible hemorrhoid  Negative FOBT today  EKG: SR with low voltage, compared with tracing from 10/18/22 no other significant change noted   Assessment and Plan: BRBPR (bright red blood per rectum) - Plan: Fecal occult blood, imunochemical, CBC  SOB (shortness of breath) - Plan: EKG 12-Lead, Comprehensive metabolic panel, Troponin I (High Sensitivity)  Multiple subsegmental pulmonary emboli without acute cor pulmonale (HCC) - Plan: CT Angio Chest W/Cm &/Or Wo Cm, CANCELED: CT Angio Chest W/Cm &/Or Wo Cm  Patient seen today for concern of bright red blood per rectum that she noted yesterday on 1 occasion.  It now seems to have resolved.  Fecal occult blood testing is also negative.  Will obtain a CBC to look for any drop in her hemoglobin Her colonoscopy is up to date-completed in 2020, Dr. Rhea Belton removed a few polyps  In addition, she notes shortness of breath for about 1 week. She was actually seen in the ER on 8/29; that note mentions she was feeling lightheaded and had chest pain  She does have history of pulmonary embolism x 2, she is compliant with her blood  thinner  Decided to obtain a CT angiogram today to make sure pulmonary embolism is not still an issue Signed Abbe Amsterdam, MD  Received labs and CT report, called pt CT angiogram is negative for PE Troponin is normal, no evidence of acute cardiac ischemia Her hemoglobin is basically stable.  It has dropped slightly since blood was done a week ago, but looking back further it is stable to improved.  I have asked her to let me know if she should have any more episodes of rectal bleeding  I have asked her let me know if her shortness of breath symptoms should change or worsen at all.  She has cardiology follow-up next month  Results for orders placed or performed in visit on 10/24/22  Fecal occult blood, imunochemical  Result Value Ref Range   NORMAL RANGE: negative   CBC  Result Value Ref Range   WBC 6.5 4.0 - 10.5 K/uL   RBC 3.95 3.87 - 5.11 Mil/uL   Platelets 303.0 150.0 - 400.0 K/uL   Hemoglobin 11.4 (L) 12.0 - 15.0 g/dL   HCT 65.7 (L) 84.6 - 96.2 %   MCV 88.2 78.0 - 100.0 fl   MCHC 32.7 30.0 - 36.0 g/dL   RDW 95.2 84.1 - 32.4 %  Comprehensive metabolic panel  Result Value Ref Range   Sodium 140 135 - 145 mEq/L   Potassium 4.1 3.5 - 5.1 mEq/L   Chloride 103 96 - 112 mEq/L   CO2 33 (H) 19 - 32 mEq/L   Glucose,  Bld 88 70 - 99 mg/dL   BUN 11 6 - 23 mg/dL   Creatinine, Ser 3.47 0.40 - 1.20 mg/dL   Total Bilirubin 0.3 0.2 - 1.2 mg/dL   Alkaline Phosphatase 78 39 - 117 U/L   AST 16 0 - 37 U/L   ALT 11 0 - 35 U/L   Total Protein 6.7 6.0 - 8.3 g/dL   Albumin 3.5 3.5 - 5.2 g/dL   GFR 42.59 >56.38 mL/min   Calcium 9.3 8.4 - 10.5 mg/dL  Troponin I (High Sensitivity)  Result Value Ref Range   High Sens Troponin I 4 2 - 17 ng/L   *Note: Due to a large number of results and/or encounters for the requested time period, some results have not been displayed. A complete set of results can be found in Results Review.    CT Angio Chest W/Cm &/Or Wo Cm  Result Date:  10/24/2022 CLINICAL DATA:  Worsening shortness of breath. Previous pulmonary embolism. EXAM: CT ANGIOGRAPHY CHEST WITH CONTRAST TECHNIQUE: Multidetector CT imaging of the chest was performed using the standard protocol during bolus administration of intravenous contrast. Multiplanar CT image reconstructions and MIPs were obtained to evaluate the vascular anatomy. RADIATION DOSE REDUCTION: This exam was performed according to the departmental dose-optimization program which includes automated exposure control, adjustment of the mA and/or kV according to patient size and/or use of iterative reconstruction technique. CONTRAST:  80mL OMNIPAQUE IOHEXOL 350 MG/ML SOLN COMPARISON:  02/06/2022 FINDINGS: Cardiovascular: Satisfactory opacification of pulmonary arteries noted, and no pulmonary emboli identified. No evidence of thoracic aortic dissection or aneurysm. Mediastinum/Nodes: No masses or pathologically enlarged lymph nodes identified. Lungs/Pleura: No pulmonary mass, infiltrate, or effusion. Upper abdomen: Small hiatal hernia remains stable. Musculoskeletal: No suspicious bone lesions identified. Review of the MIP images confirms the above findings. IMPRESSION: No evidence of pulmonary embolism or other acute findings. Stable small hiatal hernia. Electronically Signed   By: Danae Orleans M.D.   On: 10/24/2022 13:22

## 2022-10-25 ENCOUNTER — Encounter: Payer: Self-pay | Admitting: Family Medicine

## 2022-10-28 ENCOUNTER — Ambulatory Visit: Payer: Medicare Other

## 2022-10-30 ENCOUNTER — Ambulatory Visit: Payer: Medicare Other

## 2022-10-30 ENCOUNTER — Other Ambulatory Visit (HOSPITAL_BASED_OUTPATIENT_CLINIC_OR_DEPARTMENT_OTHER): Payer: Self-pay

## 2022-10-30 VITALS — BP 138/72 | HR 84

## 2022-10-30 DIAGNOSIS — I10 Essential (primary) hypertension: Secondary | ICD-10-CM

## 2022-10-30 DIAGNOSIS — Z7901 Long term (current) use of anticoagulants: Secondary | ICD-10-CM

## 2022-10-30 MED ORDER — INFLUENZA VAC A&B SURF ANT ADJ 0.5 ML IM SUSY
0.5000 mL | PREFILLED_SYRINGE | Freq: Once | INTRAMUSCULAR | 0 refills | Status: AC
  Filled 2022-10-30: qty 0.5, 1d supply, fill #0

## 2022-10-30 NOTE — Progress Notes (Signed)
10/30/2022 Name: Stephanie Parks MRN: [id number] DOB: 09/02/1945  No chief complaint on file.   Stephanie Parks is a 77 y.o. year old female who presented for a telephone visit.   They were referred to the pharmacist by their PCP for assistance in managing medication access.    Subjective:  Medication Access/Adherence  Patient reports affordability concerns with their medications: Yes  -  patient states most of her meds are $0 or $10 copays except Eliquis is $47 / month which she states is too much.  Applied for LIS 09/27/2022. Patient still has not received letter from [employer] with decision.   Patient reports access/transportation concerns to their pharmacy: No  Patient reports adherence concerns with their medications:  No     Patient has [facility] plan and [place] Medicaid only family planning on file.   She is taking Eliquis for VTE prevention - she has had 2 PEs and is on long term anticoagulation. She reports she has 1 week of Eliquis left.   Hypertension:  Current medications: carvedilol, losartan hydrochlorothiazide (but was not on med list) and spironolactone.   Reviewed chart notes and looks like patient was instructed by Dr Patsy Lager to restart losartan hydrochlorothiazide 100/25mg  - take 0.5 tablet daily in July 2024.   Patient presented to Urgent Care 10/17/2022 for lightheadedness. Blood pressure on arrival was 179/94 and pulse 86. EKG normal. Urgent care recommended she be evaluated at ED 10/17/2022 - ED Visit. Arrived at 7:28pm. Left at 3:12am on 8/30 - stated that she had work at [employer]. All labs were WNL - maybe a little low blood glucose at 75 - had not eaten since 7am 08/29.  BP at home later the same day was 135/74. HR was 102.   Patient has blood pressure cuff at home and has been checking 1 or 2 times per week.  09/01 - 123/67 and HR 86 09/04 - 148/88 and HR 82 09/09 - 128/69 and HR 94  Patient denies any recent dizziness or  lightheadedness.  BP Readings from Last 3 Encounters:  10/30/22 138/72  10/24/22 124/80  10/18/22 (!) 144/80     Objective:  Lab Results  Component Value Date   HGBA1C 6.1 (H) 06/08/2022    Lab Results  Component Value Date   CREATININE 0.87 10/24/2022   BUN 11 10/24/2022   NA 140 10/24/2022   K 4.1 10/24/2022   CL 103 10/24/2022   CO2 33 (H) 10/24/2022    Lab Results  Component Value Date   CHOL 180 06/08/2022   HDL 67 06/08/2022   LDLCALC 101 (H) 06/08/2022   TRIG 62 06/08/2022   CHOLHDL 2.7 06/08/2022    Medications Reviewed Today   Medications were not reviewed in this encounter       Assessment/Plan:  Hypertension:  - Continue current medication regimen - carvedilol 6.25mg  twice a day, losrtan hydrochlorothiazide 100/25mg  0.5 tablet daily and spironolactone 25mg  0.5 tablet daily.  - Reviewed blood pressure and HR goals.  - Reviewed proper use of blood pressure cuff at home. Recommended checking 3 times with 5 minutes between each check and average the 3 readings. Continue to check blood pressure 2 to 3 times per week   Medication Management: - checked with [facility] - patient's Eliquis is still $47 which means either her LIS application has not been reviewed yet or she was denied LIS. Patient to notify me when she receives letter from [employer]. - Patient was provided 1  week of Eliquis 2.5mg  samples- she will take 2 tabs = 5mg  twice a day. Also provided with a 30 day free coupon but I think she has already used one when she was discharged form hospital in the past but she was not sure.  - Since she has not reached the Medicare coverage gap yet, she is not eligible to apply for medication assistance program for Eliquis.  - Alternatives to Eliquis would be: Xarelto (which also is $47 / month), Pradaxa or generic dabigatran (not on formulary and is $80 on GoodRx) or warfarin which would require monthly INR checks but cost is $0 copay.  Health Maintenance:   Reviewed vaccination history and discussed benefits of influenza and COVID  vaccinations Patient plans to get influenza vaccine today at [facility] and COVID vaccine in 2 weeks. (She preferred not to get both at the same time)   Follow Up Plan: 1 to 2 months.   Henrene Pastor, PharmD Clinical Pharmacist [facility]

## 2022-11-05 ENCOUNTER — Telehealth: Payer: Self-pay | Admitting: Orthopedic Surgery

## 2022-11-05 NOTE — Telephone Encounter (Signed)
Patient called and wanted to know when she can come get another shot in shoulder. [phone]

## 2022-11-06 NOTE — Telephone Encounter (Signed)
Called and scheduled appt

## 2022-11-06 NOTE — Telephone Encounter (Signed)
Tried calling pt. No answer and mailbox was full

## 2022-11-06 NOTE — Telephone Encounter (Signed)
October for subacromial injection but she was having some AC joint symptoms at her last visit so we could potentially try an [facility] joint injection sooner in the next couple weeks if she would like to try that and see how much effect that has

## 2022-11-13 ENCOUNTER — Encounter: Payer: Self-pay | Admitting: Surgical

## 2022-11-13 ENCOUNTER — Ambulatory Visit (INDEPENDENT_AMBULATORY_CARE_PROVIDER_SITE_OTHER): Payer: Medicare Other | Admitting: Surgical

## 2022-11-13 ENCOUNTER — Other Ambulatory Visit: Payer: Self-pay

## 2022-11-13 DIAGNOSIS — M19011 Primary osteoarthritis, right shoulder: Secondary | ICD-10-CM

## 2022-11-15 ENCOUNTER — Encounter: Payer: Self-pay | Admitting: Surgical

## 2022-11-15 MED ORDER — METHYLPREDNISOLONE ACETATE 40 MG/ML IJ SUSP
13.3300 mg | INTRAMUSCULAR | Status: AC | PRN
Start: 2022-11-13 — End: 2022-11-13
  Administered 2022-11-13: 13.33 mg via INTRA_ARTICULAR

## 2022-11-15 MED ORDER — LIDOCAINE HCL 1 % IJ SOLN
3.0000 mL | INTRAMUSCULAR | Status: AC | PRN
Start: 2022-11-13 — End: 2022-11-13
  Administered 2022-11-13: 3 mL

## 2022-11-15 MED ORDER — BUPIVACAINE HCL 0.25 % IJ SOLN
0.6600 mL | INTRAMUSCULAR | Status: AC | PRN
Start: 2022-11-13 — End: 2022-11-13
  Administered 2022-11-13: .66 mL via INTRA_ARTICULAR

## 2022-11-15 NOTE — Progress Notes (Signed)
Follow-up Office Visit Note   Patient: Stephanie Parks           Date of Birth: Dec 22, 1945           MRN: [id number] Visit Date: 11/13/2022 Requested by: Pearline Cables, MD [address] [place],  [place] [address] PCP: Pearline Cables, MD  Subjective: Chief Complaint  Patient presents with  . Right Shoulder - Pain    HPI: Stephanie Parks is a 77 y.o. female who returns to the office for follow-up visit.    Plan at last visit was: Stephanie Parks is a 77 y.o. female who returns to the office for follow-up visit for right shoulder.  Plan from last visit was noted above in HPI.  They now return with significant relief from subacromial injection on 05/06/2022 that lasted up until about a month ago.  She has some continued rotator cuff weakness that is consistent with her history of full-thickness tear of the supraspinatus tendon that was noted from MRI in August 2022.  Still not bothering her enough for any surgical intervention while these injections are lasting for about 4 to 5 months at a time.  She would like to repeat subacromial injection today and this was administered without complication.  She also has some new tenderness over the [facility] joint that was not present on the last exam in March which may be giving her some increased pain as well.  If the subacromial injection has incomplete resolution of her pain this time, could consider her back into try ultrasound-guided [facility] joint injection.  Patient agreed with plan.  Follow-up as needed.   Since then, patient notes subacromial injection provided about 40 to 50% relief for 3 to 4 weeks before wearing off.  This is not as helpful as her prior subacromial injection which gave her great relief for several months at a time.  No new injury.  She feels that some of the pain was never really improved with the injection and contrast with prior injections.  She has difficulty laying on that shoulder at night.  She would like to try  a different type of injection and is not interested in surgical intervention.              ROS: All systems reviewed are negative as they relate to the chief complaint within the history of present illness.  Patient denies fevers or chills.  Assessment & Plan: Visit Diagnoses:  1. Arthritis of right acromioclavicular joint     Plan: Stephanie Parks is a 77 y.o. female who returns to the office for follow-up visit.  Plan from last visit was noted above in HPI.  They now return with incomplete relief from prior subacromial injection.  She does have a lot of tenderness on the [facility] joint of the right shoulder today which is similar to her prior exam on 7/31.  She would like to try right shoulder AC joint injection to see how much relief this will give her.  There is excellent flow of the injection during administration of the injection with needle confirmation under ultrasound.  Plan is to have patient reach out in about 2 weeks on MyChart to let us know how this injection did.  If this helps significantly, we could potentially repeat this in the future.  Follow-Up Instructions: No follow-ups on file.   Orders:  Orders Placed This Encounter  Procedures  . US Guided Needle Placement - No Linked Charges  No orders of the defined types were placed in this encounter.     Procedures: Medium Joint Inj: R acromioclavicular on 11/13/2022 9:50 AM Indications: diagnostic evaluation and pain Details: 25 G 1.5 in needle, ultrasound-guided superior approach Medications: 3 mL lidocaine 1 %; 0.66 mL bupivacaine 0.25 %; 13.33 mg methylPREDNISolone acetate 40 MG/ML Outcome: tolerated well, no immediate complications Procedure, treatment alternatives, risks and benefits explained, specific risks discussed. Consent was given by the patient. Immediately prior to procedure a time out was called to verify the correct patient, procedure, equipment, support staff and site/side marked as required. Patient was prepped  and draped in the usual sterile fashion.     Clinical Data: No additional findings.  Objective: Vital Signs: There were no vitals taken for this visit.  Physical Exam:  Constitutional: Patient appears well-developed HEENT:  Head: Normocephalic Eyes:EOM are normal Neck: Normal range of motion Cardiovascular: Normal rate Pulmonary/chest: Effort normal Neurologic: Patient is alert Skin: Skin is warm Psychiatric: Patient has normal mood and affect  Ortho Exam: Ortho exam demonstrates right shoulder with 50 degrees X rotation, 95 degrees abduction, 160 degrees forward elevation passively and actively.  External rotation strength of the right shoulder relative to the left is 4/5 compared with 5/5 respectively.  5 -/5 supraspinatus strength of the right shoulder with 5/5 subscapularis strength.  Negative external rotation lag sign and Hornblower sign.  Mild tenderness over the bicipital groove.  Moderate tenderness over the [facility] joint of the right shoulder.  Does have pain reproduced with crossarm adduction.  Specialty Comments:  No specialty comments available.  Imaging: No results found.   PMFS History: Patient Active Problem List   Diagnosis Date Noted  . Multiple subsegmental pulmonary emboli without acute cor pulmonale (HCC) 07/23/2022  . TIA (transient ischemic attack) 06/08/2022  . Diastolic dysfunction 06/08/2022  . Obesity (BMI 30-39.9) 06/08/2022  . OSA (obstructive sleep apnea) 06/08/2022  . Unilateral primary osteoarthritis, right hip 05/29/2022  . Status post total replacement of left hip 09/04/2021  . DJD (degenerative joint disease) 09/04/2021  . Iron deficiency anemia 03/13/2021  . Erythropoietin deficiency anemia 03/13/2021  . Unilateral primary osteoarthritis, left hip 10/18/2020  . Greater trochanteric bursitis of left hip 10/18/2020  . Headache 06/07/2020  . Acute respiratory failure with hypoxia (HCC) 05/02/2020  . Post-COVID chronic dyspnea 05/02/2020  .  Pulsatile neck mass 04/26/2020  . Pneumonia due to COVID-19 virus 03/12/2020  . Headache disorder 01/04/2020  . Seasonal allergic conjunctivitis 10/04/2019  . Dysfunction of right eustachian tube 10/04/2019  . Pre-diabetes 06/02/2019  . Seasonal and perennial allergic rhinitis 12/22/2018  . Heart palpitations 12/22/2018  . History of chest pain 07/20/2018  . Brain aneurysm 07/20/2018  . Dyslipidemia 05/04/2018  . Tendonitis, Achilles, right 12/08/2017  . Unilateral primary osteoarthritis, left knee 10/07/2017  . Ingrown toenail 08/10/2017  . Coughing 04/24/2017  . CAD (coronary artery disease) 03/01/2015  . Aneurysm (HCC) 03/01/2015  . Dizziness 03/01/2015  . Hypokalemia 03/01/2015  . Paresthesia of arm 03/01/2015  . Achalasia 09/28/2012  . CN (constipation) 09/28/2012  . Cephalalgia 08/24/2012  . Essential hypertension 04/19/2011  . Hyperlipidemia 04/19/2011  . GERD (gastroesophageal reflux disease) 04/19/2011  . Chest pain 04/04/2011   Past Medical History:  Diagnosis Date  . Achalasia 09/28/2012  . Allergic rhinitis 03/01/2015  . Anemia   . Aneurysm (HCC) 03/01/2015   stable 5 mm periopthalmic right ICA aneurysm 03/25/21 CTA  . BP (high blood pressure) 03/01/2015  . Bronchitis 04/23/2018  . Cephalalgia  08/24/2012   Overview:  ICD-10 cut over    . Chest pain 04/04/2011   Coronary Ca Score 0, no significant CAD 08/20/18; CP with troponin 4>32 s/p LHC showing normal coronaries 12/29/18  . CN (constipation) 09/28/2012  . Coughing 04/24/2017  . Decreased potassium in the blood 03/01/2015  . Diverticulosis   . Dizziness 03/01/2015  . Erythropoietin deficiency anemia 03/13/2021  . GERD (gastroesophageal reflux disease)   . Hiatal hernia   . Hypercholesterolemia 03/01/2015  . Hyperlipidemia   . Hypertension   . Ingrown toenail 08/10/2017  . Inguinal hernia   . Iron deficiency anemia 03/13/2021  . Migraine headache   . Onychomycosis 12/08/2017  . Paresthesia of arm  03/01/2015  . PE (pulmonary thromboembolism) (HCC) 03/13/2020   in setting of + COVID-19, s/p Eliquis x 3 month  . Schatzki's ring   . Tendonitis, Achilles, right 12/08/2017  . Unilateral primary osteoarthritis, left knee 10/07/2017    Family History  Problem Relation Age of Onset  . Allergic rhinitis Sister   . Diabetes Sister   . Hypertension Sister   . Heart attack Father 47  . Heart disease Father   . Stomach cancer Maternal Grandmother   . Heart disease Mother   . Colon cancer Neg Hx   . Angioedema Neg Hx   . Asthma Neg Hx   . Eczema Neg Hx   . Urticaria Neg Hx   . Immunodeficiency Neg Hx   . Breast cancer Neg Hx   . Migraines Neg Hx   . Headache Neg Hx     Past Surgical History:  Procedure Laterality Date  . ABDOMINAL HYSTERECTOMY    . BACK SURGERY     x 3  . CARDIAC CATHETERIZATION  12/29/2018   angiographically normal coronary arteries 12/29/18 ([place])  . CARPAL TUNNEL RELEASE     left wrist  . CERVICAL SPINE SURGERY    . HEMORRHOID SURGERY    . TOTAL HIP ARTHROPLASTY Left 09/04/2021   Procedure: LEFT TOTAL HIP ARTHROPLASTY ANTERIOR APPROACH;  Surgeon: Kathryne Hitch, MD;  Location: [facility] OR;  Service: Orthopedics;  Laterality: Left;   Social History   Occupational History  . Occupation: Lobbyist: [id number]  Tobacco Use  . Smoking status: Former    Current packs/day: 0.00    Average packs/day: 0.5 packs/day for 20.0 years (10.0 ttl pk-yrs)    Types: Cigarettes    Start date: 02/19/1964    Quit date: 02/19/1984    Years since quitting: 38.7  . Smokeless tobacco: Never  Vaping Use  . Vaping status: Never Used  Substance and Sexual Activity  . Alcohol use: No  . Drug use: No  . Sexual activity: Yes    Partners: Male

## 2022-11-20 ENCOUNTER — Encounter: Payer: Self-pay | Admitting: Family Medicine

## 2022-11-20 DIAGNOSIS — K5909 Other constipation: Secondary | ICD-10-CM

## 2022-11-20 DIAGNOSIS — M19011 Primary osteoarthritis, right shoulder: Secondary | ICD-10-CM

## 2022-11-22 ENCOUNTER — Ambulatory Visit (INDEPENDENT_AMBULATORY_CARE_PROVIDER_SITE_OTHER): Payer: Medicare Other

## 2022-11-22 DIAGNOSIS — J309 Allergic rhinitis, unspecified: Secondary | ICD-10-CM

## 2022-11-26 DIAGNOSIS — I2699 Other pulmonary embolism without acute cor pulmonale: Secondary | ICD-10-CM | POA: Diagnosis not present

## 2022-11-26 DIAGNOSIS — R002 Palpitations: Secondary | ICD-10-CM | POA: Diagnosis not present

## 2022-11-26 DIAGNOSIS — I10 Essential (primary) hypertension: Secondary | ICD-10-CM | POA: Diagnosis not present

## 2022-11-26 DIAGNOSIS — I824Y9 Acute embolism and thrombosis of unspecified deep veins of unspecified proximal lower extremity: Secondary | ICD-10-CM | POA: Diagnosis not present

## 2022-11-26 DIAGNOSIS — R0600 Dyspnea, unspecified: Secondary | ICD-10-CM | POA: Diagnosis not present

## 2022-11-26 NOTE — Telephone Encounter (Signed)
Okay for mri arthrogram of affected shoulder

## 2022-11-27 ENCOUNTER — Telehealth: Payer: Self-pay | Admitting: Orthopaedic Surgery

## 2022-11-27 ENCOUNTER — Telehealth: Payer: Medicare Other

## 2022-11-27 NOTE — Telephone Encounter (Signed)
Patient called and wants to ask if she can get an injection in her hip? [phone]

## 2022-12-03 ENCOUNTER — Telehealth: Payer: Self-pay | Admitting: Family Medicine

## 2022-12-03 NOTE — Telephone Encounter (Signed)
Pt called stating that she was returning [name]'s call. After reviewing chart, noted that no tele note was visible for this encounter. Advised a message would be sent back to reach out to her, when possible.

## 2022-12-04 ENCOUNTER — Other Ambulatory Visit (HOSPITAL_BASED_OUTPATIENT_CLINIC_OR_DEPARTMENT_OTHER): Payer: Self-pay

## 2022-12-05 ENCOUNTER — Ambulatory Visit: Payer: Medicare Other | Admitting: Sports Medicine

## 2022-12-05 ENCOUNTER — Encounter: Payer: Self-pay | Admitting: Sports Medicine

## 2022-12-05 ENCOUNTER — Other Ambulatory Visit: Payer: Self-pay

## 2022-12-05 DIAGNOSIS — M1611 Unilateral primary osteoarthritis, right hip: Secondary | ICD-10-CM | POA: Diagnosis not present

## 2022-12-05 DIAGNOSIS — M25551 Pain in right hip: Secondary | ICD-10-CM | POA: Diagnosis not present

## 2022-12-05 DIAGNOSIS — Z96642 Presence of left artificial hip joint: Secondary | ICD-10-CM

## 2022-12-05 MED ORDER — LIDOCAINE HCL 1 % IJ SOLN
4.0000 mL | INTRAMUSCULAR | Status: AC | PRN
Start: 2022-12-05 — End: 2022-12-05
  Administered 2022-12-05: 4 mL

## 2022-12-05 MED ORDER — METHYLPREDNISOLONE ACETATE 40 MG/ML IJ SUSP
40.0000 mg | INTRAMUSCULAR | Status: AC | PRN
Start: 2022-12-05 — End: 2022-12-05
  Administered 2022-12-05: 40 mg via INTRA_ARTICULAR

## 2022-12-05 NOTE — Progress Notes (Signed)
Stephanie Parks - 77 y.o. female MRN [id number]  Date of birth: 12-Oct-1945  Office Visit Note: Visit Date: 12/05/2022 PCP: Pearline Cables, MD Referred by: Pearline Cables, MD  Subjective: Chief Complaint  Patient presents with   Right Hip - Pain    Injection   HPI: Stephanie Parks is a pleasant 77 y.o. female who presents today for acute on chronic right hip pain with known osteoarthritis based on imaging.  She has a history of left hip arthroplasty performed by my partner, Dr. Magnus Ivan. Her right hip pain has been bothering her for months although recently has been exacerbated and given her quite a bit of pain and difficulty with daily function.  Pain is over the anterior hip joint and will radiate posterior laterally as well.  She does ambulate with a cane.  She does use Tylenol consistently and uses tramadol 50 mg once to twice weekly for breakthrough pain only.  Pertinent ROS were reviewed with the patient and found to be negative unless otherwise specified above in HPI.   Assessment & Plan: Visit Diagnoses:  1. Unilateral primary osteoarthritis, right hip   2. Pain in right hip   3. Status post total replacement of left hip    Plan: Impression is acute exacerbation of advanced right hip osteoarthritis.  I did review her MRI which shows essentially full-thickness cartilage loss with reactive subchondral marrow edema within the femoral head.  She has advanced osteoarthritis, but also possible she has a degree of insufficiency fracture given her cartilage loss.  Through shared decision-making, we did proceed with ultrasound-guided intra-articular hip injection, patient tolerated well.  Injection protocol and activity modification.  She may continue Tylenol, ice for any postinjection pain.  May use her chronic tramadol 50 mg daily as needed for breakthrough pain.  Would like to see what sort of relief she gets in about 1 month.  I do believe the injection will be helpful, but if  for some reason she does not get good relief from the intra-articular injection, I believe her pain is more so then likely emanating from the insufficiency fracture/subchondral marrow edema.  Depending on her relief, could consider hip replacement with Dr. Magnus Ivan. She will f/u with Dr. Magnus Ivan or myself in one month.  Follow-up: Return in about 1 month (around 01/05/2023) for with Dr. Magnus Ivan or myself for R-hip .   Meds & Orders: No orders of the defined types were placed in this encounter.   Orders Placed This Encounter  Procedures   Large Joint Inj   US Guided Needle Placement - No Linked Charges     Procedures: Large Joint Inj: R hip joint on 12/05/2022 9:46 AM Indications: pain Details: 22 G 3.5 in needle, ultrasound-guided anterior approach Medications: 4 mL lidocaine 1 %; 40 mg methylPREDNISolone acetate 40 MG/ML Outcome: tolerated well, no immediate complications  Procedure: US-guided intra-articular hip injection, Right After discussion on risks/benefits/indications and informed verbal consent was obtained, a timeout was performed. Patient was lying supine on exam table. The hip was cleaned with betadine and alcohol swabs. Then utilizing ultrasound guidance, the patient's femoral head and neck junction was identified and subsequently injected with 4:1 lidocaine:depomedrol via an in-plane approach with ultrasound visualization of the injectate administered into the hip joint. Patient tolerated procedure well without immediate complications.  Procedure, treatment alternatives, risks and benefits explained, specific risks discussed. Consent was given by the patient. Immediately prior to procedure a time out was called to verify the correct patient,  procedure, equipment, support staff and site/side marked as required. Patient was prepped and draped in the usual sterile fashion.          Clinical History: No specialty comments available.  She reports that she quit smoking about  38 years ago. Her smoking use included cigarettes. She started smoking about 58 years ago. She has a 10 pack-year smoking history. She has never used smokeless tobacco.  Recent Labs    06/08/22 1525  HGBA1C 6.1*    Objective:    Physical Exam  Gen: Well-appearing, in no acute distress; non-toxic CV: Well-perfused. Warm.  Resp: Breathing unlabored on room air; no wheezing. Psych: Fluid speech in conversation; appropriate affect; normal thought process Neuro: Sensation intact throughout. No gross coordination deficits.   Ortho Exam - Right hip: No significant pain with passive logroll although there is about 10 degrees less of terminal range of motion, 8 degrees of external range of motion.  Significantly positive FADIR test.  No redness or soft tissue abnormality.  Imaging:  *Independent review and interpretation of right hip MRI from 05/05/2022 was performed by myself today.  X-rays show high-grade to near full cartilage loss within the right femoral acetabular joint.  There is advanced arthritic change as well as a likely reactive small effusion.  There is bone marrow edema within the femoral head, indicative of advancing arthritis versus possible subchondral insufficiency fracture without breakdown of the femoral head and neck junction.  Narrative & Impression  CLINICAL DATA:  Right hip pain.   EXAM: MR OF THE RIGHT HIP WITHOUT CONTRAST   TECHNIQUE: Multiplanar, multisequence MR imaging was performed. No intravenous contrast was administered.   COMPARISON:  Pelvis and right hip radiographs 04/03/2022; MRI left hip 10/31/2020   FINDINGS: Bones: There is metallic susceptibility artifact from total left hip arthroplasty that is obscures the majority of the visualized proximal femur and portions of the left hemipelvis. There is again a mild to moderate pubic symphysis joint effusion with mild joint space narrowing and mild-to-moderate peripheral degenerative osteophytosis.  Within the limitations of left-sided metallic artifact and inhomogeneous fat saturation, no acute fracture is seen.   Mild-to-moderate bilateral inferior sacroiliac joint space narrowing and subchondral marrow edema.   Articular cartilage and labrum   Right Hip:   Articular cartilage: There is worsened right femoroacetabular osteoarthritis, now with full-thickness cartilage loss throughout the majority of the anterior superior quadrant of the femoral head. At the minimally anterior aspect of the superior femoral head there is a region measuring up to 10 mm in AP dimension of 1 mm subcortical depression with high-grade underlying marrow edema within this region extending into the anterior superior femoral head quadrant (sagittal series 7, image 12 and coronal series 8 images tendon 11). Findings suggest a focal subchondral right femoral head fracture.   Labrum: There is moderate attenuation and peripheral Marylu Lund regularity of the superior right acetabular labrum diffusely.   Joint or bursal effusion   Joint effusion:  Mild-to-moderate right hip joint effusion.   Bursae: No trochanteric bursitis on either side.   Muscles and tendons   Muscles and tendons: The origins of the bilateral rectus femoris tendons are intact. Within the limitations of left-sided metallic susceptibility artifact, there appears to be mild fluid bright signal within the bilateral common hamstring tendon origins, possibly slightly greater on the left and compatible with tendinosis with tiny interstitial tears. No tendon retraction. The right gluteus minimus and medius tendon insertions are intact. The left sided gluteus minimus and medius tendon  insertions are partially obscured by artifact. The bilateral iliopsoas tendon insertions appear intact.   Other findings   Miscellaneous: There is mild edema within the vastus intermedius muscle just anterior to the proximal femoral diaphysis (axial series 5,  image 24), possibly muscle strain and/or secondary edema from the right femoroacetabular osteoarthritis.   Mild-to-moderate distal sigmoid diverticulosis.   IMPRESSION: Compared to 10/31/2020 MRI:   1. Worsening severe right femoroacetabular osteoarthritis, now with full-thickness cartilage loss throughout the majority of the anterior superior quadrant of the femoral head. At the minimally anterior aspect of the superior femoral head there is 1 mm cortical depression along a 10 mm AP dimension with high-grade underlying marrow edema. Findings suggest a focal subchondral right femoral head fracture. 2. Mild-to-moderate right hip joint effusion. 3. Mild bilateral common hamstring origin tendinosis with tiny interstitial tears. No tendon retraction. 4. Interval total left hip arthroplasty.     Electronically Signed   By: Neita Garnet M.D.   On: 05/07/2022 11:28   Narrative & Impression  CLINICAL DATA:  Severe right hip pain   EXAM: DG HIP (WITH OR WITHOUT PELVIS) 2-3V RIGHT   COMPARISON:  CT abdomen and pelvis 02/06/2022   FINDINGS: There are mild degenerative changes of the right hip with osteophyte formation, unchanged from prior. There is no acute fracture or dislocation.   Left hip arthroplasty appears in anatomic alignment without evidence for hardware loosening. There are mild degenerative changes of the sacroiliac joints. Soft tissues are within normal limits.   IMPRESSION: 1. No acute fracture or dislocation. 2. Mild degenerative changes of the right hip.     Electronically Signed   By: Darliss Cheney M.D.   On: 04/03/2022 15:13   Past Medical/Family/Surgical/Social History: Medications & Allergies reviewed per EMR, new medications updated. Patient Active Problem List   Diagnosis Date Noted   Multiple subsegmental pulmonary emboli without acute cor pulmonale (HCC) 07/23/2022   TIA (transient ischemic attack) 06/08/2022   Diastolic dysfunction 06/08/2022    Obesity (BMI 30-39.9) 06/08/2022   OSA (obstructive sleep apnea) 06/08/2022   Unilateral primary osteoarthritis, right hip 05/29/2022   Status post total replacement of left hip 09/04/2021   DJD (degenerative joint disease) 09/04/2021   Iron deficiency anemia 03/13/2021   Erythropoietin deficiency anemia 03/13/2021   Unilateral primary osteoarthritis, left hip 10/18/2020   Greater trochanteric bursitis of left hip 10/18/2020   Headache 06/07/2020   Acute respiratory failure with hypoxia (HCC) 05/02/2020   Post-COVID chronic dyspnea 05/02/2020   Pulsatile neck mass 04/26/2020   Pneumonia due to COVID-19 virus 03/12/2020   Headache disorder 01/04/2020   Seasonal allergic conjunctivitis 10/04/2019   Dysfunction of right eustachian tube 10/04/2019   Pre-diabetes 06/02/2019   Seasonal and perennial allergic rhinitis 12/22/2018   Heart palpitations 12/22/2018   History of chest pain 07/20/2018   Brain aneurysm 07/20/2018   Dyslipidemia 05/04/2018   Tendonitis, Achilles, right 12/08/2017   Unilateral primary osteoarthritis, left knee 10/07/2017   Ingrown toenail 08/10/2017   Coughing 04/24/2017   CAD (coronary artery disease) 03/01/2015   Aneurysm (HCC) 03/01/2015   Dizziness 03/01/2015   Hypokalemia 03/01/2015   Paresthesia of arm 03/01/2015   Achalasia 09/28/2012   Constipation 09/28/2012   Cephalalgia 08/24/2012   Essential hypertension 04/19/2011   Hyperlipidemia 04/19/2011   GERD (gastroesophageal reflux disease) 04/19/2011   Chest pain 04/04/2011   Past Medical History:  Diagnosis Date   Achalasia 09/28/2012   Allergic rhinitis 03/01/2015   Anemia    Aneurysm (HCC)  03/01/2015   stable 5 mm periopthalmic right ICA aneurysm 03/25/21 CTA   BP (high blood pressure) 03/01/2015   Bronchitis 04/23/2018   Cephalalgia 08/24/2012   Overview:  ICD-10 cut over     Chest pain 04/04/2011   Coronary Ca Score 0, no significant CAD 08/20/18; CP with troponin 4>32 s/p LHC showing  normal coronaries 12/29/18   CN (constipation) 09/28/2012   Coughing 04/24/2017   Decreased potassium in the blood 03/01/2015   Diverticulosis    Dizziness 03/01/2015   Erythropoietin deficiency anemia 03/13/2021   GERD (gastroesophageal reflux disease)    Hiatal hernia    Hypercholesterolemia 03/01/2015   Hyperlipidemia    Hypertension    Ingrown toenail 08/10/2017   Inguinal hernia    Iron deficiency anemia 03/13/2021   Migraine headache    Onychomycosis 12/08/2017   Paresthesia of arm 03/01/2015   PE (pulmonary thromboembolism) (HCC) 03/13/2020   in setting of + COVID-19, s/p Eliquis x 3 month   Schatzki's ring    Tendonitis, Achilles, right 12/08/2017   Unilateral primary osteoarthritis, left knee 10/07/2017   Family History  Problem Relation Age of Onset   Allergic rhinitis Sister    Diabetes Sister    Hypertension Sister    Heart attack Father 50   Heart disease Father    Stomach cancer Maternal Grandmother    Heart disease Mother    Colon cancer Neg Hx    Angioedema Neg Hx    Asthma Neg Hx    Eczema Neg Hx    Urticaria Neg Hx    Immunodeficiency Neg Hx    Breast cancer Neg Hx    Migraines Neg Hx    Headache Neg Hx    Past Surgical History:  Procedure Laterality Date   ABDOMINAL HYSTERECTOMY     BACK SURGERY     x 3   CARDIAC CATHETERIZATION  12/29/2018   angiographically normal coronary arteries 12/29/18 ([place])   CARPAL TUNNEL RELEASE     left wrist   CERVICAL SPINE SURGERY     HEMORRHOID SURGERY     TOTAL HIP ARTHROPLASTY Left 09/04/2021   Procedure: LEFT TOTAL HIP ARTHROPLASTY ANTERIOR APPROACH;  Surgeon: Kathryne Hitch, MD;  Location: [facility] OR;  Service: Orthopedics;  Laterality: Left;   Social History   Occupational History   Occupation: Lobbyist: [id number]  Tobacco Use   Smoking status: Former    Current packs/day: 0.00    Average packs/day: 0.5 packs/day for 20.0 years (10.0 ttl pk-yrs)    Types: Cigarettes    Start  date: 02/19/1964    Quit date: 02/19/1984    Years since quitting: 38.8   Smokeless tobacco: Never  Vaping Use   Vaping status: Never Used  Substance and Sexual Activity   Alcohol use: No   Drug use: No   Sexual activity: Yes    Partners: Male

## 2022-12-05 NOTE — Telephone Encounter (Signed)
Patient has a phone appointment with Clinical Pharmacist Practitioner 11/27/2022. There was no answer at time of appoitment and a message was left on patient's phone.

## 2022-12-11 ENCOUNTER — Ambulatory Visit: Payer: Medicare Other

## 2022-12-11 DIAGNOSIS — I10 Essential (primary) hypertension: Secondary | ICD-10-CM

## 2022-12-11 DIAGNOSIS — Z7901 Long term (current) use of anticoagulants: Secondary | ICD-10-CM

## 2022-12-11 MED ORDER — LOSARTAN POTASSIUM-HCTZ 100-25 MG PO TABS: 0.5000 | ORAL_TABLET | Freq: Every day | ORAL | 1 refills | Status: DC

## 2022-12-11 NOTE — Telephone Encounter (Signed)
Rescheduled appt for 12/11/2022

## 2022-12-11 NOTE — Progress Notes (Unsigned)
12/11/2022 Name: Stephanie Parks MRN: [id number] DOB: [birth year]  Chief Complaint  Patient presents with   Medication Management    Stephanie Parks is a 77 y.o. year old female who presented for a telephone visit.   They were referred to the pharmacist by their PCP for assistance in managing medication access.    Subjective:  Medication Access/Adherence  Patient reports affordability concerns with their medications: Yes  -  patient states most of her meds are $0 or $10 copays except Eliquis is $47 / month which she states is too much but she has not missed any doses Applied for LIS 09/27/2022. Patient still has not received letter from [employer] with decision.   Patient reports access/transportation concerns to their pharmacy: No  Patient reports adherence concerns with their medications:  No     Patient has [facility] plan and [place] [employer] only family planning on file.   She is taking Eliquis for VTE prevention - she has had 2 PEs and is on long term anticoagulation. She reports she has 1 week of Eliquis left.   Hypertension:  Current medications: carvedilol, losartan hydrochlorothiazide - take 0.5 tablet daily and spironolactone.   Past medications tried: metoprolol ER - stopped due to patient feeling like it was causing palpitations  Today patient reports that she feels that her recent weight gain is related to carvedilol. She has been taking carvedilol since June 2024. Patient feels that she has gained about 10 lbs and her clothes are tighter. Per last 3 office weights though her weight seems to be lower compare to June 2024.  Patient also reports having an episode yesterday of palpitations that lasted about an hour. She had a similar episode a few days ago and HR was 112.   Cardiologist is Dr Stephanie Parks with [facility]. Last visit was 11/26/2022. In her notes she mentions patient has been experiencing constipation. Mentioned that abdominal distension might be  leading to her shortness of breath. Patient will see GI next week.   Wt Readings from Last 3 Encounters:  10/24/22 228 lb (103.4 kg)  08/19/22 235 lb 9.6 oz (106.9 kg)  08/07/22 236 lb (107 kg)    Patient has blood pressure cuff at home and has been checking 1 or 2 times per week but did not have any readings written down to report today.   Patient denies any recent dizziness or lightheadedness.  BP Readings from Last 3 Encounters:  10/30/22 138/72  10/24/22 124/80  10/18/22 (!) 144/80     Objective:  Lab Results  Component Value Date   HGBA1C 6.1 (H) 06/08/2022    Lab Results  Component Value Date   CREATININE 0.87 10/24/2022   BUN 11 10/24/2022   NA 140 10/24/2022   K 4.1 10/24/2022   CL 103 10/24/2022   CO2 33 (H) 10/24/2022    Lab Results  Component Value Date   CHOL 180 06/08/2022   HDL 67 06/08/2022   LDLCALC 101 (H) 06/08/2022   TRIG 62 06/08/2022   CHOLHDL 2.7 06/08/2022    Medications Reviewed Today     Reviewed by Stephanie Parks, RPH-CPP (Pharmacist) on 12/11/22 at 1643  Med List Status: <None>   Medication Order Taking? Sig Documenting Provider Last Dose Status Informant  Acetaminophen Extra Strength 500 MG TABS [id number]  Take 1-2 tablets by mouth every 8 (eight) hours as needed (pain, headache, fever). [provider]  Active   albuterol (VENTOLIN HFA) 108 (90 Base) MCG/ACT inhaler  [id number] No Inhale 2 puffs into the lungs every 6 (six) hours as needed for wheezing or shortness of breath.  Patient not taking: Reported on 12/11/2022   Parks, Stephanie Found, MD Not Taking Active   apixaban (ELIQUIS) 5 MG TABS tablet [id number] Yes Take 1 tablet (5 mg total) by mouth 2 (two) times daily. Parks, Stephanie Found, MD Taking Active   Calcium Carb-Cholecalciferol (CALCIUM 600 + D PO) [id number]  Take 1 tablet by mouth daily. [provider]  Active Self, Pharmacy Records  carboxymethylcellulose (REFRESH PLUS) 0.5 % SOLN [id number]  Place 1 drop  into both eyes 3 (three) times daily as needed (dry eyes). [provider]  Active Self, Pharmacy Records  carvedilol (COREG) 6.25 MG tablet [id number] Yes Take 1 tablet (6.25 mg total) by mouth 2 (two) times daily with a meal. Parks, Stephanie Found, MD Taking Active   Cyanocobalamin (B-12 PO) [id number]  Take 2 tablets by mouth daily. [provider]  Active Self, Pharmacy Records  ELDERBERRY PO [id number]  Take 1 capsule by mouth daily. [provider]  Active Self, Pharmacy Records  EPINEPHrine (EPIPEN 2-PAK) 0.3 mg/0.3 mL IJ SOAJ injection [id number]  Inject 0.3 mg into the muscle as needed for anaphylaxis. Per allergen immunotherapy protocol Parks, Stephanie Found, MD  Active Self, Pharmacy Records  fluticasone [facility]) 50 MCG/ACT nasal spray [id number]  Place 2 sprays into both nostrils daily as needed for allergies. Parks, Stephanie Found, MD  Active Self, Pharmacy Records  KLOR-CON M20 20 MEQ tablet [id number] Yes TAKE 2 TABLETS BY MOUTH EVERY DAY  Patient taking differently: Take 20 mEq by mouth daily.   Parks, Stephanie Found, MD Taking Active   losartan-hydrochlorothiazide [facility]) 100-25 MG tablet [id number] Yes Take 0.5 tablets by mouth daily. [provider] Taking Active   Multiple Vitamins-Minerals (ALIVE MULTI-VITAMIN PO) [id number]  Take 1 tablet by mouth daily. [provider]  Active Self, Pharmacy Records  NON FORMULARY [id number]  Pt uses a cpap nightly [provider]  Active Self, Pharmacy Records  nortriptyline (PAMELOR) 25 MG capsule [id number] Yes Take 2 capsules (50 mg total) by mouth at bedtime. Parks, Stephanie Found, MD Taking Active            Med Note Stephanie Parks B   Wed Oct 30, 2022  9:15 AM) For headache prevention  omeprazole (PRILOSEC) 40 MG capsule [id number] Yes Take 1 capsule (40 mg total) by mouth daily. Parks, Stephanie Found, MD Taking Active   polyethylene glycol (MIRALAX) 17 g packet [id number]  Take 17 g by mouth daily as needed for  mild constipation or moderate constipation. Parks, Stephanie Found, MD  Active   rosuvastatin (CRESTOR) 20 MG tablet [id number] Yes Take 1 tablet (20 mg total) by mouth daily. Leroy Sea, MD Taking Active   spironolactone (ALDACTONE) 25 MG tablet [id number] Yes Take 0.5 tablets (12.5 mg total) by mouth daily. Parks, Stephanie Found, MD Taking Active   traMADol (ULTRAM) 50 MG tablet [id number] Yes Take 1 tablet (50 mg total) by mouth every 8 (eight) hours as needed. Parks, Stephanie Found, MD Taking Active               Assessment/Plan:  Hypertension:  - Continue current medication regimen - carvedilol 6.25mg  twice a day, losrtan hydrochlorothiazide 100/25mg  0.5 tablet daily and spironolactone 25mg  0.5 tablet daily.  - I will check with Dr Patsy Lager about her recommendations regarding palpitations and weight gain with carvedilol (carvedilol can cause weight gain but not sure that this  is really related to the carvedilol) - I also recommended she contact Dr Stephanie Parks since she is her cardiologist. Bisoprolol might be a possible alternative beta blocker.  - Reviewed blood pressure and HR goals.   Meds ordered this encounter  Medications   losartan-hydrochlorothiazide (HYZAAR) 100-25 MG tablet    Sig: Take 0.5 tablets by mouth daily.    Dispense:  45 tablet    Refill:  1     Medication Management: - Not sure why she has not received letter about LIS / Extra Help decision yet. Offered to reapply with patient today but she decided she would prefer to wait a little longer.   - Since she has not reached the Medicare coverage gap yet, she is not eligible to apply for medication assistance program for Eliquis.  - Alternatives to Eliquis would be: Xarelto (which also is $47 / month), Pradaxa or generic dabigatran (not on formulary and is $80 on GoodRx) or warfarin which would require monthly INR checks but cost is $0 copay. - Discussed 2025 changes to Medicare Part D.   Follow Up Plan: 1 to 2 months.    Stephanie Parks, PharmD Clinical Pharmacist [facility]

## 2022-12-18 ENCOUNTER — Ambulatory Visit (INDEPENDENT_AMBULATORY_CARE_PROVIDER_SITE_OTHER): Payer: Medicare Other

## 2022-12-18 DIAGNOSIS — J309 Allergic rhinitis, unspecified: Secondary | ICD-10-CM

## 2022-12-19 DIAGNOSIS — K5904 Chronic idiopathic constipation: Secondary | ICD-10-CM | POA: Diagnosis not present

## 2022-12-19 DIAGNOSIS — R6881 Early satiety: Secondary | ICD-10-CM | POA: Diagnosis not present

## 2022-12-19 DIAGNOSIS — I10 Essential (primary) hypertension: Secondary | ICD-10-CM | POA: Diagnosis not present

## 2022-12-31 ENCOUNTER — Ambulatory Visit (HOSPITAL_BASED_OUTPATIENT_CLINIC_OR_DEPARTMENT_OTHER)
Admission: RE | Admit: 2022-12-31 | Discharge: 2022-12-31 | Disposition: A | Discharge status: Home / Self Care | Payer: Medicare Other | Source: Ambulatory Visit | Attending: Medical | Admitting: Medical

## 2022-12-31 ENCOUNTER — Encounter: Payer: Self-pay | Admitting: Hematology & Oncology

## 2022-12-31 ENCOUNTER — Other Ambulatory Visit (HOSPITAL_BASED_OUTPATIENT_CLINIC_OR_DEPARTMENT_OTHER): Payer: Self-pay

## 2022-12-31 ENCOUNTER — Ambulatory Visit: Payer: Medicare Other | Admitting: Medical

## 2022-12-31 VITALS — BP 127/64 | HR 93 | Temp 98.4°F | Ht 64.0 in | Wt 226.0 lb

## 2022-12-31 DIAGNOSIS — R6883 Chills (without fever): Secondary | ICD-10-CM

## 2022-12-31 DIAGNOSIS — E876 Hypokalemia: Secondary | ICD-10-CM

## 2022-12-31 DIAGNOSIS — R051 Acute cough: Secondary | ICD-10-CM | POA: Diagnosis not present

## 2022-12-31 DIAGNOSIS — R5383 Other fatigue: Secondary | ICD-10-CM

## 2022-12-31 LAB — POCT INFLUENZA A/B
Influenza A, POC: NEGATIVE
Influenza B, POC: NEGATIVE

## 2022-12-31 LAB — POC COVID19 BINAXNOW: SARS Coronavirus 2 Ag: NEGATIVE

## 2022-12-31 MED ORDER — AZITHROMYCIN 250 MG PO TABS
ORAL_TABLET | ORAL | 0 refills | Status: AC
Start: 2022-12-31 — End: 2023-01-05

## 2022-12-31 MED ORDER — ALBUTEROL SULFATE HFA 108 (90 BASE) MCG/ACT IN AERS
2.0000 | INHALATION_SPRAY | Freq: Four times a day (QID) | RESPIRATORY_TRACT | 0 refills | Status: DC | PRN
  Filled 2022-12-31: qty 18, 25d supply, fill #0

## 2022-12-31 MED ORDER — BENZONATATE 100 MG PO CAPS: 100.0000 mg | ORAL_CAPSULE | Freq: Three times a day (TID) | ORAL | 0 refills | Status: DC | PRN

## 2022-12-31 MED ORDER — APIXABAN 5 MG PO TABS
5.0000 mg | ORAL_TABLET | Freq: Two times a day (BID) | ORAL | 0 refills | Status: DC
  Filled 2022-12-31: qty 60, 30d supply, fill #0

## 2022-12-31 NOTE — Progress Notes (Signed)
   Subjective:    Patient ID: Stephanie Parks, female    DOB: Jun 25, 1945, 77 y.o.   MRN: [id number]  HPI Discussed the use of AI scribe software for clinical note transcription with the patient, who gave verbal consent to proceed.  History of Present Illness   The patient, with a history of pulmonary embolism managed with Eliquis, presented with symptoms that began on a Sunday. The onset was sudden, characterized by nasal congestion and frequent bathroom visits. The following day, the patient experienced severe nasal discharge, which was clear in color. She denied sinus pain but reported a dry cough and shortness of breath, with a suspicion of wheezing. The patient denied a history of asthma or the need for an inhaler during previous illnesses.  In addition to respiratory symptoms, the patient reported body aches and chills, which began on Sunday night and persisted. She also experienced moderate to severe fatigue for a couple of days. The patient denied fever but was unable to confirm due to lack of temperature monitoring at home.  The patient has been compliant with her Eliquis medication, taking it twice daily without any missed doses. She also reported receiving allergy shots, with the next one due in December. The patient denied any known allergies to antibiotics.       On clarification does report sinus pressure  Flu and covid test negative  Review of Systems  Constitutional:  Positive for chills and fatigue.  HENT:  Positive for congestion.   Respiratory:  Positive for cough and wheezing. Negative for shortness of breath.        Mild intermittent not constant.  Cardiovascular:  Negative for chest pain and palpitations.  Gastrointestinal:  Negative for abdominal pain.  Genitourinary:  Negative for dysuria and frequency.  Musculoskeletal:  Positive for myalgias.  Skin:  Negative for rash.  Neurological:  Negative for dizziness and numbness.  Psychiatric/Behavioral:  Negative for  behavioral problems and decreased concentration.        Objective:   Physical Exam  General- No acute distress. Pleasant patient. Neck- Full range of motion, no jvd Lungs- Clear, even and unlabored. Heart- regular rate and rhythm. Neurologic- CNII- XII grossly intact.  Heent- boggy turbinates/severe swelling. Modeate sinus pressure. +pnd.  Legs- no assmetry. Negative homans sign. No edema     Assessment & Plan:  Assessment and Plan    Early sinus infection. Do want to rule out walking pneumonia. Acute onset of symptoms since Sunday, including severe clear nasal discharge, dry cough, body aches, chills, and fatigue. No sinus pain. No productive cough or vomiting. Possible wheezing reported. No known close contacts with similar symptoms. -Continue use of allergy medication. -Prescribe azithromycin due to concern for potential sinus infection. -Prescribe cough tablet for symptom relief. -Order CBC to assess for infection. -Order chest x-ray to rule out early walking pneumonia. -albuterol inhaler. if too expensive and wheezing consider low dose medrol    History of Pulmonary Embolism Patient is compliant with Eliquis twice daily. No recent missed doses. Oxygen saturation is 99%. -Continue Eliquis as prescribed. -Aware of importance of medication adherence.   Follow up in 7 days or sooner if needed

## 2022-12-31 NOTE — Patient Instructions (Addendum)
Early sinus infection. Do want to rule out walking pneumonia. Acute onset of symptoms since Sunday, including severe clear nasal discharge, dry cough, body aches, chills, and fatigue. sinus pain. No productive cough or vomiting. Possible wheezing reported. No known close contacts with similar symptoms. -Continue use of allergy medication. -Prescribe azithromycin due to concern for potential sinus infection. -Prescribe cough tablet for symptom relief. -Order CBC to assess for infection. -Order chest x-ray to rule out early walking pneumonia. -albuterol inhaler. if too expensive and wheezing consider low dose medrol    History of Pulmonary Embolism Patient is compliant with Eliquis twice daily. No recent missed doses. Oxygen saturation is 99%. -Continue Eliquis as prescribed. -Aware of importance of medication adherence.   Follow up in 7 days or sooner if needed

## 2023-01-01 ENCOUNTER — Other Ambulatory Visit (HOSPITAL_BASED_OUTPATIENT_CLINIC_OR_DEPARTMENT_OTHER): Payer: Self-pay

## 2023-01-01 ENCOUNTER — Other Ambulatory Visit: Payer: Medicare Other

## 2023-01-01 ENCOUNTER — Other Ambulatory Visit: Payer: Self-pay

## 2023-01-01 ENCOUNTER — Inpatient Hospital Stay: Admission: RE | Admit: 2023-01-01 | Payer: Medicare Other | Source: Ambulatory Visit

## 2023-01-01 LAB — CBC WITH DIFFERENTIAL/PLATELET
Basophils Absolute: 0.1 10*3/uL (ref 0.0–0.1)
Basophils Relative: 1 % (ref 0.0–3.0)
Eosinophils Absolute: 0.5 10*3/uL (ref 0.0–0.7)
Eosinophils Relative: 5 % (ref 0.0–5.0)
HCT: 34 % — ABNORMAL LOW (ref 36.0–46.0)
Hemoglobin: 11.3 g/dL — ABNORMAL LOW (ref 12.0–15.0)
Lymphocytes Relative: 22.4 % (ref 12.0–46.0)
Lymphs Abs: 2.1 10*3/uL (ref 0.7–4.0)
MCHC: 33.1 g/dL (ref 30.0–36.0)
MCV: 88.1 fL (ref 78.0–100.0)
Monocytes Absolute: 0.8 10*3/uL (ref 0.1–1.0)
Monocytes Relative: 8.7 % (ref 3.0–12.0)
Neutro Abs: 6 10*3/uL (ref 1.4–7.7)
Neutrophils Relative %: 62.9 % (ref 43.0–77.0)
Platelets: 284 10*3/uL (ref 150.0–400.0)
RBC: 3.86 Mil/uL — ABNORMAL LOW (ref 3.87–5.11)
RDW: 16.4 % — ABNORMAL HIGH (ref 11.5–15.5)
WBC: 9.5 10*3/uL (ref 4.0–10.5)

## 2023-01-01 LAB — COMPREHENSIVE METABOLIC PANEL
ALT: 14 U/L (ref 0–35)
AST: 22 U/L (ref 0–37)
Albumin: 3.8 g/dL (ref 3.5–5.2)
Alkaline Phosphatase: 78 U/L (ref 39–117)
BUN: 12 mg/dL (ref 6–23)
CO2: 34 meq/L — ABNORMAL HIGH (ref 19–32)
Calcium: 8.9 mg/dL (ref 8.4–10.5)
Chloride: 99 meq/L (ref 96–112)
Creatinine, Ser: 1.17 mg/dL (ref 0.40–1.20)
GFR: 45.1 mL/min — ABNORMAL LOW (ref >60.00)
Glucose, Bld: 91 mg/dL (ref 70–99)
Potassium: 3.2 meq/L — ABNORMAL LOW (ref 3.5–5.1)
Sodium: 141 meq/L (ref 135–145)
Total Bilirubin: 0.4 mg/dL (ref 0.2–1.2)
Total Protein: 6.5 g/dL (ref 6.0–8.3)

## 2023-01-01 NOTE — Addendum Note (Signed)
Addended by: Gwenevere Abbot on: 01/01/2023 11:56 AM   Modules accepted: Orders

## 2023-01-08 ENCOUNTER — Encounter: Payer: Self-pay | Admitting: Family Medicine

## 2023-01-08 ENCOUNTER — Ambulatory Visit (INDEPENDENT_AMBULATORY_CARE_PROVIDER_SITE_OTHER): Payer: Medicare Other | Admitting: Family Medicine

## 2023-01-08 ENCOUNTER — Ambulatory Visit (HOSPITAL_BASED_OUTPATIENT_CLINIC_OR_DEPARTMENT_OTHER)
Admission: RE | Admit: 2023-01-08 | Discharge: 2023-01-08 | Disposition: A | Discharge status: Home / Self Care | Payer: Medicare Other | Source: Ambulatory Visit | Attending: Family Medicine | Admitting: Family Medicine

## 2023-01-08 VITALS — BP 122/70 | HR 79 | Temp 97.6°F | Resp 18 | Ht 64.0 in | Wt 226.6 lb

## 2023-01-08 DIAGNOSIS — M25551 Pain in right hip: Secondary | ICD-10-CM | POA: Insufficient documentation

## 2023-01-08 DIAGNOSIS — E876 Hypokalemia: Secondary | ICD-10-CM | POA: Diagnosis not present

## 2023-01-08 DIAGNOSIS — M8580 Other specified disorders of bone density and structure, unspecified site: Secondary | ICD-10-CM | POA: Diagnosis not present

## 2023-01-08 DIAGNOSIS — G5711 Meralgia paresthetica, right lower limb: Secondary | ICD-10-CM

## 2023-01-08 DIAGNOSIS — Z96642 Presence of left artificial hip joint: Secondary | ICD-10-CM | POA: Diagnosis not present

## 2023-01-08 DIAGNOSIS — M1611 Unilateral primary osteoarthritis, right hip: Secondary | ICD-10-CM | POA: Diagnosis not present

## 2023-01-08 LAB — BASIC METABOLIC PANEL
BUN: 14 mg/dL (ref 6–23)
CO2: 34 meq/L — ABNORMAL HIGH (ref 19–32)
Calcium: 9 mg/dL (ref 8.4–10.5)
Chloride: 101 meq/L (ref 96–112)
Creatinine, Ser: 0.83 mg/dL (ref 0.40–1.20)
GFR: 68.08 mL/min (ref >60.00)
Glucose, Bld: 94 mg/dL (ref 70–99)
Potassium: 3.5 meq/L (ref 3.5–5.1)
Sodium: 141 meq/L (ref 135–145)

## 2023-01-08 LAB — VITAMIN B12: Vitamin B-12: >1537 pg/mL — ABNORMAL HIGH (ref 211–911)

## 2023-01-08 NOTE — Progress Notes (Signed)
[facility] [address] [place], [place] [address] [phone]  Date:  01/08/2023   Name:  Stephanie Parks   DOB:  [birth year]   MRN:  [id number]  PCP:  Pearline Cables, MD    Chief Complaint: Numbness (Burning sensation from the waist down in the R leg. This is come and go. )   History of Present Illness:  Stephanie Parks is a 77 y.o. very pleasant female patient who presents with the following:  Pt seen today with burning in her right leg Most recent visit with myself was in September   History of hypertension, hyperlipidemia, allergic rhinitis, OSA on CPAP. She has tended to have frequent ER visits for chest pain likely related to anxiety. She also had COVID-19 complicated by pulmonary embolism January of 2022 -She is no longer using oxygen. Due to recurrent PE in May of 2024 she is now on lifelong eliqus   Lumbar decompression 1998   Today she notes a burning sensation in her right lower quadrant and into the right anterior thigh She noted this about one month ago and it has been going on intermittently for the past month It can be painful Does not run down the leg, no numbness of the posterior leg or weakness of the leg Episodes of pain may last one minute, approx 4x a day Can happen in any position, any activity She has chronic lower back pain, no change here  NKI that she can recall  She has tried some biofreeze but this is not really helping  She is s/p left total hip-patient states she has known history of right hip arthritis and had planned to have this joint replaced as well-however this was delayed due to her pulmonary embolism  She is a pt of Dr Magnus Ivan- she has not mentioned her current symptoms to him  Patient Active Problem List   Diagnosis Date Noted   Multiple subsegmental pulmonary emboli without acute cor pulmonale (HCC) 07/23/2022   TIA (transient ischemic attack) 06/08/2022   Diastolic  dysfunction 06/08/2022   Obesity (BMI 30-39.9) 06/08/2022   OSA (obstructive sleep apnea) 06/08/2022   Unilateral primary osteoarthritis, right hip 05/29/2022   Status post total replacement of left hip 09/04/2021   DJD (degenerative joint disease) 09/04/2021   Iron deficiency anemia 03/13/2021   Erythropoietin deficiency anemia 03/13/2021   Unilateral primary osteoarthritis, left hip 10/18/2020   Greater trochanteric bursitis of left hip 10/18/2020   Headache 06/07/2020   Acute respiratory failure with hypoxia (HCC) 05/02/2020   Post-COVID chronic dyspnea 05/02/2020   Pulsatile neck mass 04/26/2020   Pneumonia due to COVID-19 virus 03/12/2020   Headache disorder 01/04/2020   Seasonal allergic conjunctivitis 10/04/2019   Dysfunction of right eustachian tube 10/04/2019   Pre-diabetes 06/02/2019   Seasonal and perennial allergic rhinitis 12/22/2018   Heart palpitations 12/22/2018   History of chest pain 07/20/2018   Brain aneurysm 07/20/2018   Dyslipidemia 05/04/2018   Tendonitis, Achilles, right 12/08/2017   Unilateral primary osteoarthritis, left knee 10/07/2017   Ingrown toenail 08/10/2017   Coughing 04/24/2017   CAD (coronary artery disease) 03/01/2015   Aneurysm (HCC) 03/01/2015   Dizziness 03/01/2015   Hypokalemia 03/01/2015   Paresthesia of arm 03/01/2015   Achalasia 09/28/2012   Constipation 09/28/2012   Cephalalgia 08/24/2012   Essential hypertension 04/19/2011   Hyperlipidemia 04/19/2011   GERD (gastroesophageal reflux disease) 04/19/2011   Chest pain 04/04/2011  Past Medical History:  Diagnosis Date   Achalasia 09/28/2012   Allergic rhinitis 03/01/2015   Anemia    Aneurysm (HCC) 03/01/2015   stable 5 mm periopthalmic right ICA aneurysm 03/25/21 CTA   BP (high blood pressure) 03/01/2015   Bronchitis 04/23/2018   Cephalalgia 08/24/2012   Overview:  ICD-10 cut over     Chest pain 04/04/2011   Coronary Ca Score 0, no significant CAD 08/20/18; CP with troponin  4>32 s/p LHC showing normal coronaries 12/29/18   CN (constipation) 09/28/2012   Coughing 04/24/2017   Decreased potassium in the blood 03/01/2015   Diverticulosis    Dizziness 03/01/2015   Erythropoietin deficiency anemia 03/13/2021   GERD (gastroesophageal reflux disease)    Hiatal hernia    Hypercholesterolemia 03/01/2015   Hyperlipidemia    Hypertension    Ingrown toenail 08/10/2017   Inguinal hernia    Iron deficiency anemia 03/13/2021   Migraine headache    Onychomycosis 12/08/2017   Paresthesia of arm 03/01/2015   PE (pulmonary thromboembolism) (HCC) 03/13/2020   in setting of + COVID-19, s/p Eliquis x 3 month   Schatzki's ring    Tendonitis, Achilles, right 12/08/2017   Unilateral primary osteoarthritis, left knee 10/07/2017    Past Surgical History:  Procedure Laterality Date   ABDOMINAL HYSTERECTOMY     BACK SURGERY     x 3   CARDIAC CATHETERIZATION  12/29/2018   angiographically normal coronary arteries 12/29/18 ([place])   CARPAL TUNNEL RELEASE     left wrist   CERVICAL SPINE SURGERY     HEMORRHOID SURGERY     TOTAL HIP ARTHROPLASTY Left 09/04/2021   Procedure: LEFT TOTAL HIP ARTHROPLASTY ANTERIOR APPROACH;  Surgeon: Kathryne Hitch, MD;  Location: [facility] OR;  Service: Orthopedics;  Laterality: Left;    Social History   Tobacco Use   Smoking status: Former    Current packs/day: 0.00    Average packs/day: 0.5 packs/day for 20.0 years (10.0 ttl pk-yrs)    Types: Cigarettes    Start date: 02/19/1964    Quit date: 02/19/1984    Years since quitting: 38.9   Smokeless tobacco: Never  Vaping Use   Vaping status: Never Used  Substance Use Topics   Alcohol use: No   Drug use: No    Family History  Problem Relation Age of Onset   Allergic rhinitis Sister    Diabetes Sister    Hypertension Sister    Heart attack Father 42   Heart disease Father    Stomach cancer Maternal Grandmother    Heart disease Mother    Colon cancer Neg Hx    Angioedema Neg  Hx    Asthma Neg Hx    Eczema Neg Hx    Urticaria Neg Hx    Immunodeficiency Neg Hx    Breast cancer Neg Hx    Migraines Neg Hx    Headache Neg Hx     Allergies  Allergen Reactions   Shellfish Allergy Hives and Swelling   Ace Inhibitors Other (See Comments)    Pt cannot recall her reaction but tolerates arb    Gabapentin Other (See Comments)    headaches   Pitavastatin     Unknown reaction  Other Reaction(s): Other (See Comments)  ADVERSE REACTION,  Pt unaware of this reaction; states she has been on crestor in the past with no issues   Sulfa Antibiotics Other (See Comments) and Rash    Fine bumps   Topiramate Palpitations  Heart race,     Medication list has been reviewed and updated.  Current Outpatient Medications on File Prior to Visit  Medication Sig Dispense Refill   Acetaminophen Extra Strength 500 MG TABS Take 1-2 tablets by mouth every 8 (eight) hours as needed (pain, headache, fever).     albuterol (VENTOLIN HFA) 108 (90 Base) MCG/ACT inhaler Inhale 2 puffs into the lungs every 6 (six) hours as needed. 18 g 0   apixaban (ELIQUIS) 5 MG TABS tablet Take 1 tablet (5 mg total) by mouth 2 (two) times daily. 60 tablet 0   benzonatate (TESSALON) 100 MG capsule Take 1 capsule (100 mg total) by mouth 3 (three) times daily as needed for cough. 30 capsule 0   Calcium Carb-Cholecalciferol (CALCIUM 600 + D PO) Take 1 tablet by mouth daily.     carboxymethylcellulose (REFRESH PLUS) 0.5 % SOLN Place 1 drop into both eyes 3 (three) times daily as needed (dry eyes).     carvedilol (COREG) 6.25 MG tablet Take 1 tablet (6.25 mg total) by mouth 2 (two) times daily with a meal. 180 tablet 0   Cyanocobalamin (B-12 PO) Take 2 tablets by mouth daily.     ELDERBERRY PO Take 1 capsule by mouth daily.     EPINEPHrine (EPIPEN 2-PAK) 0.3 mg/0.3 mL IJ SOAJ injection Inject 0.3 mg into the muscle as needed for anaphylaxis. Per allergen immunotherapy protocol 1 each 2   fluticasone (FLONASE)  50 MCG/ACT nasal spray Place 2 sprays into both nostrils daily as needed for allergies. 48 g 3   KLOR-CON M20 20 MEQ tablet TAKE 2 TABLETS BY MOUTH EVERY DAY (Patient taking differently: Take 20 mEq by mouth daily.) 180 tablet 1   losartan-hydrochlorothiazide (HYZAAR) 100-25 MG tablet Take 0.5 tablets by mouth daily. 45 tablet 1   Multiple Vitamins-Minerals (ALIVE MULTI-VITAMIN PO) Take 1 tablet by mouth daily.     NON FORMULARY Pt uses a cpap nightly     nortriptyline (PAMELOR) 25 MG capsule Take 2 capsules (50 mg total) by mouth at bedtime. 180 capsule 1   omeprazole (PRILOSEC) 40 MG capsule Take 1 capsule (40 mg total) by mouth daily. 90 capsule 3   polyethylene glycol (MIRALAX) 17 g packet Take 17 g by mouth daily as needed for mild constipation or moderate constipation. 90 packet 2   rosuvastatin (CRESTOR) 20 MG tablet Take 1 tablet (20 mg total) by mouth daily. 30 tablet 0   spironolactone (ALDACTONE) 25 MG tablet Take 0.5 tablets (12.5 mg total) by mouth daily. 45 tablet 3   traMADol (ULTRAM) 50 MG tablet Take 1 tablet (50 mg total) by mouth every 8 (eight) hours as needed. 60 tablet 0   No current facility-administered medications on file prior to visit.    Review of Systems:  As per HPI- otherwise negative.   Physical Examination: Vitals:   01/08/23 0922  BP: 122/70  Pulse: 79  Resp: 18  Temp: 97.6 F (36.4 C)  SpO2: 98%   Vitals:   01/08/23 0922  Weight: 226 lb 9.6 oz (102.8 kg)  Height: 5\' 4"  (1.626 m)   Body mass index is 38.9 kg/m. Ideal Body Weight: Weight in (lb) to have BMI = 25: 145.3  GEN: no acute distress.  Obese, looks well HEENT: Atraumatic, Normocephalic. Bilateral TM wnl, oropharynx normal.  PEERL,EOMI.   Ears and Nose: No external deformity. CV: RRR, No M/G/R. No JVD. No thrill. No extra heart sounds. PULM: CTA B, no wheezes, crackles, rhonchi. No retractions.  No resp. distress. No accessory muscle use. ABD: S, NT, ND, +BS. No rebound. No HSM.   Belly is benign, cannot reproduce any symptoms with palpation, no skin lesions EXTR: No c/c/e PSYCH: Normally interactive. Conversant.  Normal gait for age Normal strength of both lower extremities Patient indicates an area of tingling in her anterior right thigh most likely consistent with meralgia paresthetica Assessment and Plan: Meralgia paresthetica of right side - Plan: B12  Right hip pain - Plan: DG Hip Unilat W OR W/O Pelvis 2-3 Views Right  Hypokalemia - Plan: Basic metabolic panel  Patient seen today with likely meralgia paresthetica of her right anterior leg, but she also has some burning and tingling in the skin of her right lower abdomen.  Will obtain x-rays of her hip, check B12, BMP pending.  Further follow-up pending these results  Signed Abbe Amsterdam, MD  Received labs and x-ray report as below, message to patient  Results for orders placed or performed in visit on 01/08/23  B12  Result Value Ref Range   Vitamin B-12 >1537 (H) 211 - 911 pg/mL  Basic metabolic panel  Result Value Ref Range   Sodium 141 135 - 145 mEq/L   Potassium 3.5 3.5 - 5.1 mEq/L   Chloride 101 96 - 112 mEq/L   CO2 34 (H) 19 - 32 mEq/L   Glucose, Bld 94 70 - 99 mg/dL   BUN 14 6 - 23 mg/dL   Creatinine, Ser 6.96 0.40 - 1.20 mg/dL   GFR 29.52 >84.13 mL/min   Calcium 9.0 8.4 - 10.5 mg/dL   *Note: Due to a large number of results and/or encounters for the requested time period, some results have not been displayed. A complete set of results can be found in Results Review.   DG Hip Unilat W OR W/O Pelvis 2-3 Views Right  Result Date: 01/08/2023 CLINICAL DATA:  Worsening right hip pain. No provided history of trauma EXAM: DG HIP (WITH OR WITHOUT PELVIS) 3V RIGHT COMPARISON:  04/03/2022 x-ray FINDINGS: Left hip arthroplasty identified with Press-Fit components. The femoral component is not completely included in the imaging field at this time. Hyperostosis. Osteopenia. Sclerosis seen of both  sacroiliac joints, right-greater-than-left. There is moderate to severe concentric joint space loss of the right hip which appears to be increased from the prior x-ray. No acute fracture or dislocation. Scattered vascular calcifications. IMPRESSION: Osteopenia with multifocal degenerative changes. Increasing degenerative changes of the right hip with worsening joint space loss. Left hip arthroplasty Electronically Signed   By: Karen Kays M.D.   On: 01/08/2023 14:19    Compared with x-rays from February of this year your hip arthritis is getting worse-radiology is calling it moderate to severe

## 2023-01-08 NOTE — Patient Instructions (Addendum)
I suspect you have a condition called meralgia paresthetica  [email or web address]  I will look for any other cause on BW and an x-ray (and recheck your potassium)- will let you know what we find out asap

## 2023-01-13 ENCOUNTER — Other Ambulatory Visit (HOSPITAL_BASED_OUTPATIENT_CLINIC_OR_DEPARTMENT_OTHER): Payer: Self-pay

## 2023-01-15 ENCOUNTER — Other Ambulatory Visit: Payer: Self-pay | Admitting: Family Medicine

## 2023-01-15 DIAGNOSIS — E876 Hypokalemia: Secondary | ICD-10-CM

## 2023-01-15 DIAGNOSIS — I10 Essential (primary) hypertension: Secondary | ICD-10-CM

## 2023-01-20 ENCOUNTER — Ambulatory Visit (INDEPENDENT_AMBULATORY_CARE_PROVIDER_SITE_OTHER): Payer: Medicare Other

## 2023-01-20 DIAGNOSIS — J309 Allergic rhinitis, unspecified: Secondary | ICD-10-CM

## 2023-01-29 ENCOUNTER — Ambulatory Visit (INDEPENDENT_AMBULATORY_CARE_PROVIDER_SITE_OTHER): Payer: Medicare Other | Admitting: Pharmacist

## 2023-01-29 DIAGNOSIS — Z7901 Long term (current) use of anticoagulants: Secondary | ICD-10-CM

## 2023-01-29 DIAGNOSIS — K5909 Other constipation: Secondary | ICD-10-CM

## 2023-01-29 NOTE — Progress Notes (Signed)
01/29/2023 Name: Stephanie Parks MRN: [id number] DOB: 21-Jun-1945  Chief Complaint  Patient presents with   Medication Management   Hypertension    Stephanie Parks is a 77 y.o. year old female who presented for a telephone visit.   They were referred to the pharmacist by their PCP for assistance in managing medication access.    Subjective:  Medication Access/Adherence  Patient reports affordability concerns with their medications: Yes  -  patient states most of her meds are $0 or $10 copays except Eliquis is $47 / month which she states is too much but she has not missed any doses Applied for LIS 09/27/2022 but per patient she never received letter from [employer] with decision.   Patient reports access/transportation concerns to their pharmacy: No  Patient reports adherence concerns with their medications:  No     Patient has [facility] plan and [place] Medicaid only family planning on file.   She is taking Eliquis for VTE prevention - she has had 2 PEs and is on long term anticoagulation. She reports she has 1 week of Eliquis left.   Hypertension:  Current medications: carvedilol, losartan hydrochlorothiazide - take 0.5 tablet daily and spironolactone.   Past medications tried: metoprolol ER - stopped due to patient feeling like it was causing palpitations  At out last appointment Stephanie Parks reported felt that her recent weight gain was related to carvedilol. She has been taking carvedilol since June 2024. Patient feels that she has gained about 10 lbs and her clothes are tighter. Per last 3 office weights though her weight seems to be lower compare to June 2024. At my recommendation she did reach out to her cardiologist Dr Dion Body who recommended she continue current dose of carvedilol.   Wt Readings from Last 3 Encounters:  01/08/23 226 lb 9.6 oz (102.8 kg)  12/31/22 226 lb (102.5 kg)  10/24/22 228 lb (103.4 kg)    Patient has blood pressure cuff at home  and has been checking 1 or 2 times per week but did not have any readings written down to report today.   Patient denies any recent dizziness or lightheadedness.  BP Readings from Last 3 Encounters:  01/08/23 122/70  12/31/22 127/64  10/30/22 138/72    Patient did see GI 12/19/2022 at [facility] . Notes have that Dr Almetta Lovely started Trulance 3mg  daily for constipation. But per patient it was changed to Amitiza 24 mcg due to insurance formulary. Stephanie Parks does not feel that it has helped much with constipation. Dr Almetta Lovely Also recommended Benefiber or Metamucil but patient has not started either of these over-the-counter meds yet.    Objective:  Lab Results  Component Value Date   HGBA1C 6.1 (H) 06/08/2022    Lab Results  Component Value Date   CREATININE 0.83 01/08/2023   BUN 14 01/08/2023   NA 141 01/08/2023   K 3.5 01/08/2023   CL 101 01/08/2023   CO2 34 (H) 01/08/2023    Lab Results  Component Value Date   CHOL 180 06/08/2022   HDL 67 06/08/2022   LDLCALC 101 (H) 06/08/2022   TRIG 62 06/08/2022   CHOLHDL 2.7 06/08/2022    Medications Reviewed Today     Reviewed by Henrene Pastor, RPH-CPP (Pharmacist) on 01/29/23 at 1554  Med List Status: <None>   Medication Order Taking? Sig Documenting Provider Last Dose Status Informant  Acetaminophen Extra Strength 500 MG TABS [id number] Yes Take 1-2 tablets by mouth  every 8 (eight) hours as needed (pain, headache, fever). [provider] Taking Active   albuterol (VENTOLIN HFA) 108 (90 Base) MCG/ACT inhaler [id number] Yes Inhale 2 puffs into the lungs every 6 (six) hours as needed. Saguier, Ramon Dredge, PA-C Taking Active   apixaban (ELIQUIS) 5 MG TABS tablet [id number] Yes Take 1 tablet (5 mg total) by mouth 2 (two) times daily. Saguier, Ramon Dredge, PA-C Taking Active   benzonatate (TESSALON) 100 MG capsule [id number] Yes Take 1 capsule (100 mg total) by mouth 3 (three) times daily as needed for cough. Saguier, Ramon Dredge, PA-C Taking Active    Calcium Carb-Cholecalciferol (CALCIUM 600 + D PO) [id number] Yes Take 1 tablet by mouth daily. [provider] Taking Active Self, Pharmacy Records  carboxymethylcellulose (REFRESH PLUS) 0.5 % SOLN [id number] Yes Place 1 drop into both eyes 3 (three) times daily as needed (dry eyes). [provider] Taking Active Self, Pharmacy Records  carvedilol (COREG) 6.25 MG tablet [id number] Yes Take 1 tablet (6.25 mg total) by mouth 2 (two) times daily with a meal. Copland, Gwenlyn Found, MD Taking Active   Cyanocobalamin (B-12 PO) [id number] Yes Take 2 tablets by mouth daily. [provider] Taking Active Self, Pharmacy Records  ELDERBERRY PO [id number] Yes Take 1 capsule by mouth daily. [provider] Taking Active Self, Pharmacy Records  EPINEPHrine (EPIPEN 2-PAK) 0.3 mg/0.3 mL IJ SOAJ injection [id number] Yes Inject 0.3 mg into the muscle as needed for anaphylaxis. Per allergen immunotherapy protocol Copland, Gwenlyn Found, MD Taking Active Self, Pharmacy Records  fluticasone [facility]) 50 MCG/ACT nasal spray [id number] Yes Place 2 sprays into both nostrils daily as needed for allergies. Copland, Gwenlyn Found, MD Taking Active Self, Pharmacy Records  losartan-hydrochlorothiazide [facility]) 100-25 MG tablet [id number] Yes Take 0.5 tablets by mouth daily. Copland, Gwenlyn Found, MD Taking Active   lubiprostone (AMITIZA) 24 MCG capsule [id number] No Take 8 mcg by mouth daily with breakfast.  Patient not taking: Reported on 01/29/2023   [provider] Not Taking Active   Multiple Vitamins-Minerals (ALIVE MULTI-VITAMIN PO) [id number] Yes Take 1 tablet by mouth daily. [provider] Taking Active Self, Pharmacy Records  NON FORMULARY [id number] Yes Pt uses a cpap nightly [provider] Taking Active Self, Pharmacy Records  nortriptyline (PAMELOR) 25 MG capsule [id number] Yes Take 2 capsules (50 mg total) by mouth at bedtime. Copland, Gwenlyn Found, MD Taking Active             Med Note Henrene Pastor B   Wed Oct 30, 2022  9:15 AM) For headache prevention  omeprazole (PRILOSEC) 40 MG capsule [id number] Yes Take 1 capsule (40 mg total) by mouth daily. Copland, Gwenlyn Found, MD Taking Active   polyethylene glycol (MIRALAX) 17 g packet [id number] Yes Take 17 g by mouth daily as needed for mild constipation or moderate constipation. Copland, Gwenlyn Found, MD Taking Active   potassium chloride SA (KLOR-CON M20) 20 MEQ tablet [id number] Yes Take 2 tablets (40 mEq total) by mouth daily. Copland, Gwenlyn Found, MD Taking Active   rosuvastatin (CRESTOR) 20 MG tablet [id number] Yes Take 1 tablet (20 mg total) by mouth daily. Leroy Sea, MD Taking Active   spironolactone (ALDACTONE) 25 MG tablet [id number] Yes Take 0.5 tablets (12.5 mg total) by mouth daily. Copland, Gwenlyn Found, MD Taking Active   traMADol (ULTRAM) 50 MG tablet [id number] Yes Take 1 tablet (50 mg total) by mouth every 8 (eight) hours as needed. Copland, Gwenlyn Found, MD Taking Active  Assessment/Plan:  Hypertension:  - Continue current medication regimen - carvedilol 6.25mg  twice a day, losrtan hydrochlorothiazide 100/25mg  0.5 tablet daily and spironolactone 25mg  0.5 tablet daily.  - Reviewed blood pressure and HR goals.    Medication Management:   - Since she has not reached the Medicare coverage gap yet, she is not eligible to apply for medication assistance program for Eliquis.  Since she has not received a letter about previous LIS application - reapplied on line with patient today.  - Discussed 2025 changes to Medicare Part D.   Recommended patient start either Benefiber or Metamucil daily per Dr Gweneth Dimitri recommendation.  Henrene Pastor, PharmD Clinical Pharmacist [facility]

## 2023-02-05 ENCOUNTER — Encounter: Payer: Self-pay | Admitting: Orthopaedic Surgery

## 2023-02-05 ENCOUNTER — Other Ambulatory Visit (INDEPENDENT_AMBULATORY_CARE_PROVIDER_SITE_OTHER): Payer: Medicare Other

## 2023-02-05 ENCOUNTER — Ambulatory Visit (INDEPENDENT_AMBULATORY_CARE_PROVIDER_SITE_OTHER): Payer: Medicare Other | Admitting: Orthopaedic Surgery

## 2023-02-05 DIAGNOSIS — M79651 Pain in right thigh: Secondary | ICD-10-CM

## 2023-02-05 MED ORDER — METHYLPREDNISOLONE 4 MG PO TABS: ORAL_TABLET | ORAL | 0 refills | Status: DC

## 2023-02-05 MED ORDER — PREGABALIN 75 MG PO CAPS: 75.0000 mg | ORAL_CAPSULE | Freq: Two times a day (BID) | ORAL | 0 refills | Status: DC

## 2023-02-05 NOTE — Progress Notes (Signed)
The patient is well-known to [place].  She is 77 years old and we have replaced her left hip.  We had scheduled her for a right hip replacement but then she unfortunately developed blood clots and pulmonary embolus.  She is on chronic Eliquis now.  She comes in today with a right thigh numbness and back pain as well as sometimes pain in her left thigh.  We did obtain a MRI of her lumbar spine back in February of this year.  That MRI showed severe foraminal stenosis to the right side at L3-L4 and moderate foraminal stenosis to the left at L4-L5.  Both of these were multifactorial.  She does still report some right hip pain.  She does see her cardiologist regularly.  On exam her left hip move smoothly and fluidly.  Her right hip has pain in the groin with internal and external rotation but no blocks to rotation.  She has a positive straight leg raise bilaterally.  Note she does have pain that wakes her up at night.  It hurts throughout the day.  She is not a diabetic.  X-rays of her lumbar spine today show no acute findings.  There is significant arthritic changes at multiple levels.  I would like to try a 6-day steroid taper and have her on Lyrica 75 mg twice a day.  She has tried gabapentin in the past and had a bad reaction to gabapentin.  We will try this course of medications and then see her back in a month to see how she is doing overall.  If she can come off of blood thinning medication for short amount of time, we can send her to Dr. Alvester Morin for epidural steroid injections.

## 2023-02-17 DIAGNOSIS — J3089 Other allergic rhinitis: Secondary | ICD-10-CM | POA: Diagnosis not present

## 2023-02-17 NOTE — Progress Notes (Signed)
VIALS EXP 02-17-24

## 2023-03-03 ENCOUNTER — Ambulatory Visit (INDEPENDENT_AMBULATORY_CARE_PROVIDER_SITE_OTHER): Payer: Medicare Other

## 2023-03-03 DIAGNOSIS — J309 Allergic rhinitis, unspecified: Secondary | ICD-10-CM | POA: Diagnosis not present

## 2023-03-05 ENCOUNTER — Ambulatory Visit: Payer: Medicare Other | Admitting: Orthopaedic Surgery

## 2023-03-05 ENCOUNTER — Encounter: Payer: Self-pay | Admitting: Orthopaedic Surgery

## 2023-03-05 DIAGNOSIS — M79651 Pain in right thigh: Secondary | ICD-10-CM

## 2023-03-05 DIAGNOSIS — M5416 Radiculopathy, lumbar region: Secondary | ICD-10-CM | POA: Diagnosis not present

## 2023-03-05 DIAGNOSIS — M25551 Pain in right hip: Secondary | ICD-10-CM | POA: Diagnosis not present

## 2023-03-05 NOTE — Progress Notes (Signed)
 The patient comes in with continued chronic pain issues that are all gentleman issues.  She is 77.  She is on Eliquis  secondary to pulmonary embolus.  We have replaced her left hip and she is in need of a right hip replacement but we had to cancel that secondary to her medical status.  She also has chronic sciatica and low back pain.  I MRI of her lumbar spine in February of last year showed severe stenosis of the right side at L3-L4 there was foraminal stenosis.  We have recommended an epidural steroid injection but this can only be safe if she is off blood thinners for at least 3 days.  I have instructed her to follow-up with her cardiologist to see what is reasonable in terms of when can she come off blood thinning medications for short amount of time to consider a left-sided L3-L4 epidural steroid injection by Dr. Daisey Dryer.  We know that she has significant arthritis in her right hip.  She is still likely a surgical risk for complication given her comorbidities.  I have talked to her about reaching out to her primary care physician for referral to potentially chronic pain management as well.  She will let us  know in terms of when she can potentially come off of any type of medication so we can really set her up for some type of injections.

## 2023-03-06 ENCOUNTER — Telehealth: Payer: Self-pay | Admitting: Emergency Medicine

## 2023-03-06 NOTE — Telephone Encounter (Signed)
Copied from CRM [id number]. Topic: Clinical - Medication Question >> Mar 06, 2023  3:05 PM Lovey Newcomer R wrote: Reason for CRM: Patient calling to speak with pharmacist regarding ELIQUIS 5MG  and its cost. It went from $46 to $302 and patient would like to speak about why that increased and if there is an alternative possibly. CB# [phone] I did suggest she contact her insurance to see if there were any formulary changes.

## 2023-03-07 ENCOUNTER — Other Ambulatory Visit: Payer: Self-pay | Admitting: Family Medicine

## 2023-03-07 DIAGNOSIS — E785 Hyperlipidemia, unspecified: Secondary | ICD-10-CM

## 2023-03-07 NOTE — Telephone Encounter (Signed)
Copied from CRM [id number]. Topic: Clinical - Prescription Issue >> Mar 06, 2023  3:47 PM Irine Seal wrote: Reason for CRM: Pt is requesting if the clinic has any samples of Eliquis (apixaban) 5mg , pt has 1 tablet left. She attempted to get it refilled but stated something is going on with her insurance company because the refill was going to cost $300+ and she usually pays $47, pt is requesting call back at [phone]

## 2023-03-12 NOTE — Progress Notes (Unsigned)
[facility] [address] [place], [place] [address] [phone]  Date:  03/13/2023   Name:  Stephanie Parks   DOB:  1945/11/30   MRN:  [id number]  PCP:  Pearline Cables, MD    Chief Complaint: left side pain (Starts near pelvic region radiates upward to breast. X 1 week. Concerned that she might need a CXR because she has been more short of breath as well. Also been more lightheaded, fatigued and having low BP readings/ )   History of Present Illness:  Stephanie Parks is a 78 y.o. very pleasant female patient who presents with the following:  Patient seen today with concern of pain in her left side moving from her hip up to her breast area History of hypertension, hyperlipidemia, allergic rhinitis, OSA on CPAP. She has tended to have frequent ER visits for chest pain likely related to anxiety. She also had COVID-19 complicated by pulmonary embolism January of 2022 -She is no longer using oxygen. Due to recurrent PE in May of 2024 she is now on lifelong eliqus   Lumbar decompression 1998   Most recent visit with myself was in November when she had concern of what seemed to be meralgia paresthetica of the right leg.  She was noted also to have degenerative changes of the right hip at that time-I referred her back to her orthopedist, Dr. Allie Bossier.  He felt patient is appropriate for a right hip replacement but this is challenging given her comorbidities and use of Eliquis for pulmonary embolism.  Pt notes her BP has been running low and she is feeling lightheaded BP Readings from Last 3 Encounters:  03/13/23 (!) 124/56  01/08/23 122/70  12/31/22 127/64   She has noted pain in her left side for about 7 days.  It seemed to start in her hip and then come up her side, will come and go. The pain may last 5-6 seconds; may occur once daily most often in the evening She cannot determine anything that will cause the pain-not  associated with physical activity  She has chronic RIGHT side pain- needs a hip replacement as above and also notes a rotator cuff tear Her cardiologist is with [facility] - pt reports Dr Dion Body told her ok to hold eliquis for 3 days for a shot in her hip which she plans to have done soon  Pt also notes she has felt SOB for about one week- this is not the first time she has mentioned this but patient states it is a new problem and has been relatively persistent for about a week Worse with walking quickly  She is not having CP this time   She is on her eliquis consistently- does not miss doses  We did a CT angio for her in September which was negative   She was seen by Dr Dion Body, her cardiologist in October: Assessment: Recurrent history of pulm emboli. First episode in 2022 after COVID. Second episode May 2024 without clear trigger. On Eliquis. Repeat echo in July 2024 showed normalized RV size and function. ------------------- Patient continued to have dyspnea on exertion. Severe constipation. Tried over-the-counter stool softener and laxative without significant improvement. Will see GI in the near future. Shortness of breath could be caused by distended abdomen. Given recent echo showed improved RV size and systolic function, will continue monitor her symptoms. Will see her back in 14-month. Consider ischemia evaluation if continue  to have SOB. Thyroid function was normal in April 2024.--------------------------------------- Recent Holter monitor showed 16 beats of atrial run concerning for early stage of atrial fibrillation. On Eliquis already. Will continue to monitor symptoms.---------------------------- Plan: Follow-up in 3 months  Pt notes she was supposed to see Dr Dion Body yesterday but rescheduled to Feb due to snow  Patient Active Problem List   Diagnosis Date Noted   Multiple subsegmental pulmonary emboli without acute cor pulmonale (HCC) 07/23/2022   TIA (transient ischemic attack)  06/08/2022   Diastolic dysfunction 06/08/2022   Obesity (BMI 30-39.9) 06/08/2022   OSA (obstructive sleep apnea) 06/08/2022   Unilateral primary osteoarthritis, right hip 05/29/2022   Status post total replacement of left hip 09/04/2021   DJD (degenerative joint disease) 09/04/2021   Iron deficiency anemia 03/13/2021   Erythropoietin deficiency anemia 03/13/2021   Unilateral primary osteoarthritis, left hip 10/18/2020   Greater trochanteric bursitis of left hip 10/18/2020   Headache 06/07/2020   Acute respiratory failure with hypoxia (HCC) 05/02/2020   Post-COVID chronic dyspnea 05/02/2020   Pulsatile neck mass 04/26/2020   Pneumonia due to COVID-19 virus 03/12/2020   Headache disorder 01/04/2020   Seasonal allergic conjunctivitis 10/04/2019   Dysfunction of right eustachian tube 10/04/2019   Pre-diabetes 06/02/2019   Seasonal and perennial allergic rhinitis 12/22/2018   Heart palpitations 12/22/2018   History of chest pain 07/20/2018   Brain aneurysm 07/20/2018   Dyslipidemia 05/04/2018   Tendonitis, Achilles, right 12/08/2017   Unilateral primary osteoarthritis, left knee 10/07/2017   Ingrown toenail 08/10/2017   Coughing 04/24/2017   CAD (coronary artery disease) 03/01/2015   Aneurysm (HCC) 03/01/2015   Dizziness 03/01/2015   Hypokalemia 03/01/2015   Paresthesia of arm 03/01/2015   Achalasia 09/28/2012   Constipation 09/28/2012   Cephalalgia 08/24/2012   Essential hypertension 04/19/2011   Hyperlipidemia 04/19/2011   GERD (gastroesophageal reflux disease) 04/19/2011   Chest pain 04/04/2011    Past Medical History:  Diagnosis Date   Achalasia 09/28/2012   Allergic rhinitis 03/01/2015   Anemia    Aneurysm (HCC) 03/01/2015   stable 5 mm periopthalmic right ICA aneurysm 03/25/21 CTA   BP (high blood pressure) 03/01/2015   Bronchitis 04/23/2018   Cephalalgia 08/24/2012   Overview:  ICD-10 cut over     Chest pain 04/04/2011   Coronary Ca Score 0, no significant CAD  08/20/18; CP with troponin 4>32 s/p LHC showing normal coronaries 12/29/18   CN (constipation) 09/28/2012   Coughing 04/24/2017   Decreased potassium in the blood 03/01/2015   Diverticulosis    Dizziness 03/01/2015   Erythropoietin deficiency anemia 03/13/2021   GERD (gastroesophageal reflux disease)    Hiatal hernia    Hypercholesterolemia 03/01/2015   Hyperlipidemia    Hypertension    Ingrown toenail 08/10/2017   Inguinal hernia    Iron deficiency anemia 03/13/2021   Migraine headache    Onychomycosis 12/08/2017   Paresthesia of arm 03/01/2015   PE (pulmonary thromboembolism) (HCC) 03/13/2020   in setting of + COVID-19, s/p Eliquis x 3 month   Schatzki's ring    Tendonitis, Achilles, right 12/08/2017   Unilateral primary osteoarthritis, left knee 10/07/2017    Past Surgical History:  Procedure Laterality Date   ABDOMINAL HYSTERECTOMY     BACK SURGERY     x 3   CARDIAC CATHETERIZATION  12/29/2018   angiographically normal coronary arteries 12/29/18 ([place])   CARPAL TUNNEL RELEASE     left wrist   CERVICAL SPINE SURGERY     HEMORRHOID  SURGERY     TOTAL HIP ARTHROPLASTY Left 09/04/2021   Procedure: LEFT TOTAL HIP ARTHROPLASTY ANTERIOR APPROACH;  Surgeon: Kathryne Hitch, MD;  Location: [facility] OR;  Service: Orthopedics;  Laterality: Left;    Social History   Tobacco Use   Smoking status: Former    Current packs/day: 0.00    Average packs/day: 0.5 packs/day for 20.0 years (10.0 ttl pk-yrs)    Types: Cigarettes    Start date: 02/19/1964    Quit date: 02/19/1984    Years since quitting: 39.0   Smokeless tobacco: Never  Vaping Use   Vaping status: Never Used  Substance Use Topics   Alcohol use: No   Drug use: No    Family History  Problem Relation Age of Onset   Allergic rhinitis Sister    Diabetes Sister    Hypertension Sister    Heart attack Father 48   Heart disease Father    Stomach cancer Maternal Grandmother    Heart disease Mother    Colon cancer  Neg Hx    Angioedema Neg Hx    Asthma Neg Hx    Eczema Neg Hx    Urticaria Neg Hx    Immunodeficiency Neg Hx    Breast cancer Neg Hx    Migraines Neg Hx    Headache Neg Hx     Allergies  Allergen Reactions   Methylprednisolone Other (See Comments)    Lightheaded and "Hallucinations"   Shellfish Allergy Hives and Swelling   Ace Inhibitors Other (See Comments)    Pt cannot recall her reaction but tolerates arb    Gabapentin Other (See Comments)    headaches   Pitavastatin     Unknown reaction  Other Reaction(s): Other (See Comments)  ADVERSE REACTION,  Pt unaware of this reaction; states she has been on crestor in the past with no issues   Sulfa Antibiotics Other (See Comments) and Rash    Fine bumps   Topiramate Palpitations    Heart race,     Medication list has been reviewed and updated.  Current Outpatient Medications on File Prior to Visit  Medication Sig Dispense Refill   albuterol (VENTOLIN HFA) 108 (90 Base) MCG/ACT inhaler Inhale 2 puffs into the lungs every 6 (six) hours as needed. 18 g 0   apixaban (ELIQUIS) 5 MG TABS tablet Take 1 tablet (5 mg total) by mouth 2 (two) times daily. 60 tablet 0   Calcium Carb-Cholecalciferol (CALCIUM 600 + D PO) Take 1 tablet by mouth daily.     carboxymethylcellulose (REFRESH PLUS) 0.5 % SOLN Place 1 drop into both eyes 3 (three) times daily as needed (dry eyes).     carvedilol (COREG) 6.25 MG tablet Take 1 tablet (6.25 mg total) by mouth 2 (two) times daily with a meal. 180 tablet 1   Cyanocobalamin (B-12 PO) Take 2 tablets by mouth daily.     ELDERBERRY PO Take 1 capsule by mouth daily.     EPINEPHrine (EPIPEN 2-PAK) 0.3 mg/0.3 mL IJ SOAJ injection Inject 0.3 mg into the muscle as needed for anaphylaxis. Per allergen immunotherapy protocol 1 each 2   fluticasone (FLONASE) 50 MCG/ACT nasal spray Place 2 sprays into both nostrils daily as needed for allergies. 48 g 3   Multiple Vitamins-Minerals (ALIVE MULTI-VITAMIN PO) Take 1  tablet by mouth daily.     NON FORMULARY Pt uses a cpap nightly     nortriptyline (PAMELOR) 25 MG capsule Take 2 capsules (50 mg total) by mouth at  bedtime. 180 capsule 1   omeprazole (PRILOSEC) 40 MG capsule Take 1 capsule (40 mg total) by mouth daily. 90 capsule 3   polyethylene glycol (MIRALAX) 17 g packet Take 17 g by mouth daily as needed for mild constipation or moderate constipation. 90 packet 2   potassium chloride SA (KLOR-CON M20) 20 MEQ tablet Take 2 tablets (40 mEq total) by mouth daily. 180 tablet 1   rosuvastatin (CRESTOR) 20 MG tablet Take 1 tablet (20 mg total) by mouth daily. 30 tablet 0   spironolactone (ALDACTONE) 25 MG tablet Take 0.5 tablets (12.5 mg total) by mouth daily. 45 tablet 3   traMADol (ULTRAM) 50 MG tablet Take 1 tablet (50 mg total) by mouth every 8 (eight) hours as needed. 60 tablet 0   No current facility-administered medications on file prior to visit.    Review of Systems:  As per HPI- otherwise negative.   Physical Examination: Vitals:   03/13/23 1328  BP: (!) 124/56  Pulse: 95  Resp: 18  Temp: 97.8 F (36.6 C)  SpO2: 100%   Vitals:   03/13/23 1328  Weight: 225 lb 11.2 oz (102.4 kg)  Height: 5\' 4"  (1.626 m)   Body mass index is 38.74 kg/m. Ideal Body Weight: Weight in (lb) to have BMI = 25: 145.3  GEN: no acute distress.  Obese, looks well and her normal self HEENT: Atraumatic, Normocephalic.  Ears and Nose: No external deformity. CV: RRR, No M/G/R. No JVD. No thrill. No extra heart sounds. PULM: CTA B, no wheezes, crackles, rhonchi. No retractions. No resp. distress. No accessory muscle use. ABD: S, NT, ND, +BS. No rebound. No HSM.  I am unable to reproduce any pain in her left side at this time.  No to palpation of the abdomen or chest wall, no pain with movement of the left hip or left shoulder.  No rashes to suggest shingles. EXTR: No c/c/e PSYCH: Normally interactive. Conversant.   EKG: SR with rate 87, compared with tracing  from 10/2022 very similar, no ST elevation or depression Assessment and Plan: Essential hypertension - Plan: losartan-hydrochlorothiazide (HYZAAR) 50-12.5 MG tablet, EKG 12-Lead  SOB (shortness of breath) - Plan: D-Dimer, Quantitative, EKG 12-Lead, DG Chest 2 View, Troponin I (High Sensitivity)  Chronic anticoagulation - Plan: CBC  Left sided abdominal pain - Plan: Comprehensive metabolic panel, Lipase  Patient today with a couple of concerns.  She notes her blood pressure is running slightly low, she has felt lightheaded at times.  Will decrease her dose of losartan/HCTZ from 50/12.5 to 25/6.25, maintain her other medications at current levels  Patient mention shortness of breath today.  She reveals her main concern is another pulmonary embolism.  I advised her with her being on Eliquis another pulmonary embolism is quite unlikely though not impossible Her most recent CT angiogram was in September 2024-negative for pulmonary embolism  EKG today is stable.  Discussed options, offered to have her seen in the ER.  She declines at this time.  As such, we will obtain a chest x-ray, D-dimer and troponin.  Further follow-up pending these results  Will complete working up her "side pain" with CMP, CBC, lipase.  Patient describes episodes of pain that occur once a day and last less than 10 seconds, suspect a benign etiology  Signed Abbe Amsterdam, MD  Received results as below, message to patient  Results for orders placed or performed in visit on 03/13/23  D-Dimer, Quantitative   Collection Time: 03/13/23  2:26 PM  Result Value Ref Range   D-Dimer, Quant 0.63 (H) <0.50 mcg/mL FEU  CBC   Collection Time: 03/13/23  2:26 PM  Result Value Ref Range   WBC 7.9 4.0 - 10.5 K/uL   RBC 3.93 3.87 - 5.11 Mil/uL   Platelets 362.0 150.0 - 400.0 K/uL   Hemoglobin 11.4 (L) 12.0 - 15.0 g/dL   HCT 16.1 (L) 09.6 - 04.5 %   MCV 89.8 78.0 - 100.0 fl   MCHC 32.4 30.0 - 36.0 g/dL   RDW 40.9 81.1 - 91.4 %   Comprehensive metabolic panel   Collection Time: 03/13/23  2:26 PM  Result Value Ref Range   Sodium 142 135 - 145 mEq/L   Potassium 3.4 (L) 3.5 - 5.1 mEq/L   Chloride 102 96 - 112 mEq/L   CO2 32 19 - 32 mEq/L   Glucose, Bld 104 (H) 70 - 99 mg/dL   BUN 17 6 - 23 mg/dL   Creatinine, Ser 7.82 0.40 - 1.20 mg/dL   Total Bilirubin 0.2 0.2 - 1.2 mg/dL   Alkaline Phosphatase 68 39 - 117 U/L   AST 15 0 - 37 U/L   ALT 10 0 - 35 U/L   Total Protein 7.2 6.0 - 8.3 g/dL   Albumin 4.0 3.5 - 5.2 g/dL   GFR 95.62 (L) >13.08 mL/min   Calcium 9.2 8.4 - 10.5 mg/dL  Lipase   Collection Time: 03/13/23  2:26 PM  Result Value Ref Range   Lipase 37.0 11.0 - 59.0 U/L  Troponin I (High Sensitivity)   Collection Time: 03/13/23  2:26 PM  Result Value Ref Range   High Sens Troponin I 4 2 - 17 ng/L   *Note: Due to a large number of results and/or encounters for the requested time period, some results have not been displayed. A complete set of results can be found in Results Review.    DG Chest 2 View Result Date: 03/13/2023 CLINICAL DATA:  Short of breath EXAM: CHEST - 2 VIEW COMPARISON:  12/31/2022 FINDINGS: Frontal and lateral views of the chest demonstrate a stable cardiac silhouette. No acute airspace disease, effusion, or pneumothorax. No acute bony abnormalities. IMPRESSION: 1. No acute intrathoracic process. Electronically Signed   By: Sharlet Salina M.D.   On: 03/13/2023 15:26

## 2023-03-13 ENCOUNTER — Ambulatory Visit (HOSPITAL_BASED_OUTPATIENT_CLINIC_OR_DEPARTMENT_OTHER)
Admission: RE | Admit: 2023-03-13 | Discharge: 2023-03-13 | Disposition: A | Discharge status: Home / Self Care | Payer: Medicare Other | Source: Ambulatory Visit | Attending: Family Medicine | Admitting: Family Medicine

## 2023-03-13 ENCOUNTER — Other Ambulatory Visit (HOSPITAL_BASED_OUTPATIENT_CLINIC_OR_DEPARTMENT_OTHER): Payer: Self-pay

## 2023-03-13 ENCOUNTER — Ambulatory Visit (INDEPENDENT_AMBULATORY_CARE_PROVIDER_SITE_OTHER): Payer: Medicare Other | Admitting: Family Medicine

## 2023-03-13 ENCOUNTER — Encounter: Payer: Self-pay | Admitting: Family Medicine

## 2023-03-13 VITALS — BP 124/56 | HR 95 | Temp 97.8°F | Resp 18 | Ht 64.0 in | Wt 225.7 lb

## 2023-03-13 DIAGNOSIS — Z7901 Long term (current) use of anticoagulants: Secondary | ICD-10-CM

## 2023-03-13 DIAGNOSIS — R109 Unspecified abdominal pain: Secondary | ICD-10-CM | POA: Diagnosis not present

## 2023-03-13 DIAGNOSIS — R0602 Shortness of breath: Secondary | ICD-10-CM

## 2023-03-13 DIAGNOSIS — I10 Essential (primary) hypertension: Secondary | ICD-10-CM | POA: Diagnosis not present

## 2023-03-13 LAB — CBC
HCT: 35.3 % — ABNORMAL LOW (ref 36.0–46.0)
Hemoglobin: 11.4 g/dL — ABNORMAL LOW (ref 12.0–15.0)
MCHC: 32.4 g/dL (ref 30.0–36.0)
MCV: 89.8 fL (ref 78.0–100.0)
Platelets: 362 10*3/uL (ref 150.0–400.0)
RBC: 3.93 Mil/uL (ref 3.87–5.11)
RDW: 15.1 % (ref 11.5–15.5)
WBC: 7.9 10*3/uL (ref 4.0–10.5)

## 2023-03-13 LAB — COMPREHENSIVE METABOLIC PANEL
ALT: 10 U/L (ref 0–35)
AST: 15 U/L (ref 0–37)
Albumin: 4 g/dL (ref 3.5–5.2)
Alkaline Phosphatase: 68 U/L (ref 39–117)
BUN: 17 mg/dL (ref 6–23)
CO2: 32 meq/L (ref 19–32)
Calcium: 9.2 mg/dL (ref 8.4–10.5)
Chloride: 102 meq/L (ref 96–112)
Creatinine, Ser: 0.99 mg/dL (ref 0.40–1.20)
GFR: 55.03 mL/min — ABNORMAL LOW (ref >60.00)
Glucose, Bld: 104 mg/dL — ABNORMAL HIGH (ref 70–99)
Potassium: 3.4 meq/L — ABNORMAL LOW (ref 3.5–5.1)
Sodium: 142 meq/L (ref 135–145)
Total Bilirubin: 0.2 mg/dL (ref 0.2–1.2)
Total Protein: 7.2 g/dL (ref 6.0–8.3)

## 2023-03-13 LAB — TROPONIN I (HIGH SENSITIVITY): High Sens Troponin I: 4 ng/L (ref 2–17)

## 2023-03-13 LAB — LIPASE: Lipase: 37 U/L (ref 11.0–59.0)

## 2023-03-13 LAB — D-DIMER, QUANTITATIVE: D-Dimer, Quant: 0.63 ug{FEU}/mL — ABNORMAL HIGH (ref <0.50)

## 2023-03-13 MED ORDER — LOSARTAN POTASSIUM-HCTZ 50-12.5 MG PO TABS
0.5000 | ORAL_TABLET | Freq: Every day | ORAL | 3 refills | Status: DC
  Filled 2023-03-13: qty 45, 90d supply, fill #0
  Filled 2023-06-05: qty 45, 90d supply, fill #1

## 2023-03-13 NOTE — Patient Instructions (Addendum)
Let's reduce your dose of losartan/hydrochlorothiazide to a 1/2 of the 50/12.5 mg strength  Please go to the lab and then to the ground floor for a chest film I will be in touch with your labs- stat troponin and D dimer today  If either of these are positive we will take further action!    Be sure to tell your cardiologist about your shortness of breath- she may want to do a more definitive test for your heart such as a nuclear stress to get more evidence that your heart is ok   If anything is changing or getting worse please let me know

## 2023-03-14 NOTE — Telephone Encounter (Signed)
Pt was sen on 1/23

## 2023-03-17 ENCOUNTER — Ambulatory Visit (INDEPENDENT_AMBULATORY_CARE_PROVIDER_SITE_OTHER): Payer: Medicare Other

## 2023-03-17 ENCOUNTER — Telehealth: Payer: Self-pay | Admitting: Orthopaedic Surgery

## 2023-03-17 ENCOUNTER — Other Ambulatory Visit: Payer: Self-pay

## 2023-03-17 DIAGNOSIS — J309 Allergic rhinitis, unspecified: Secondary | ICD-10-CM | POA: Diagnosis not present

## 2023-03-17 MED ORDER — EPINEPHRINE 0.3 MG/0.3ML IJ SOAJ: 0.3000 mg | INTRAMUSCULAR | 0 refills | Status: AC | PRN

## 2023-03-17 NOTE — Telephone Encounter (Signed)
Pt called stating her cardiologist Dr approved her to come off of Eliquis for 3 days to do her injection. Please call pt ASAP to set appt. Pt phone number is [phone].

## 2023-03-18 ENCOUNTER — Other Ambulatory Visit: Payer: Self-pay

## 2023-03-18 DIAGNOSIS — M5416 Radiculopathy, lumbar region: Secondary | ICD-10-CM

## 2023-03-18 NOTE — Telephone Encounter (Signed)
Patient aware referral has been made to [facility] and to NOT stop Elliquis until [facility] advises her to

## 2023-03-22 ENCOUNTER — Other Ambulatory Visit: Payer: Self-pay | Admitting: Family Medicine

## 2023-03-22 DIAGNOSIS — E785 Hyperlipidemia, unspecified: Secondary | ICD-10-CM

## 2023-03-24 ENCOUNTER — Telehealth: Payer: Self-pay | Admitting: Family Medicine

## 2023-03-24 NOTE — Telephone Encounter (Signed)
Work schedule conflict, need to reschedule. Reschedule 10/01/23 at 2:30 pm

## 2023-03-25 ENCOUNTER — Ambulatory Visit (INDEPENDENT_AMBULATORY_CARE_PROVIDER_SITE_OTHER): Payer: Medicare Other

## 2023-03-25 VITALS — Ht 64.0 in | Wt 225.0 lb

## 2023-03-25 DIAGNOSIS — Z0000 Encounter for general adult medical examination without abnormal findings: Secondary | ICD-10-CM

## 2023-03-25 DIAGNOSIS — Z1211 Encounter for screening for malignant neoplasm of colon: Secondary | ICD-10-CM

## 2023-03-25 NOTE — Progress Notes (Signed)
 Subjective:   Stephanie Parks is a 78 y.o. female who presents for Medicare Annual (Subsequent) preventive examination.  Visit Complete: Virtual I connected with  Stephanie Parks on 03/25/23 by a audio enabled telemedicine application and verified that I am speaking with the correct person using two identifiers.  Patient Location: Home  Provider Location: Home Office  I discussed the limitations of evaluation and management by telemedicine. The patient expressed understanding and agreed to proceed.  Vital Signs: Because this visit was a virtual/telehealth visit, some criteria may be missing or patient reported. Any vitals not documented were not able to be obtained and vitals that have been documented are patient reported.  Patient Medicare AWV questionnaire was completed by the patient on 03/24/23; I have confirmed that all information answered by patient is correct and no changes since this date.  Cardiac Risk Factors include: advanced age (>57men, >5 women);hypertension     Objective:    Today's Vitals   03/25/23 1607  Weight: 225 lb (102.1 kg)  Height: 5' 4 (1.626 m)   Body mass index is 38.62 kg/m.     03/25/2023    4:16 PM 10/17/2022    9:38 PM 08/07/2022    6:47 PM 07/11/2022   12:53 PM 06/07/2022   11:41 PM 05/22/2022    7:28 AM 04/03/2022    2:40 PM  Advanced Directives  Does Patient Have a Medical Advance Directive? No No No No No No No  Would patient like information on creating a medical advance directive? No - Patient declined  No - Guardian declined No - Patient declined No - Patient declined      Current Medications (verified) Outpatient Encounter Medications as of 03/25/2023  Medication Sig   albuterol  (VENTOLIN  HFA) 108 (90 Base) MCG/ACT inhaler Inhale 2 puffs into the lungs every 6 (six) hours as needed.   apixaban  (ELIQUIS ) 5 MG TABS tablet Take 1 tablet (5 mg total) by mouth 2 (two) times daily.   Calcium  Carb-Cholecalciferol (CALCIUM  600 + D PO) Take 1  tablet by mouth daily.   carboxymethylcellulose (REFRESH PLUS) 0.5 % SOLN Place 1 drop into both eyes 3 (three) times daily as needed (dry eyes).   carvedilol  (COREG ) 6.25 MG tablet Take 1 tablet (6.25 mg total) by mouth 2 (two) times daily with a meal.   Cyanocobalamin  (B-12 PO) Take 2 tablets by mouth daily.   ELDERBERRY PO Take 1 capsule by mouth daily.   EPINEPHrine  (EPIPEN  2-PAK) 0.3 mg/0.3 mL IJ SOAJ injection Inject 0.3 mg into the muscle as needed for anaphylaxis. Per allergen immunotherapy protocol   fluticasone  (FLONASE ) 50 MCG/ACT nasal spray Place 2 sprays into both nostrils daily as needed for allergies.   losartan -hydrochlorothiazide  (HYZAAR ) 50-12.5 MG tablet Take 0.5 tablets by mouth daily.   Multiple Vitamins-Minerals (ALIVE MULTI-VITAMIN PO) Take 1 tablet by mouth daily.   NON FORMULARY Pt uses a cpap nightly   nortriptyline  (PAMELOR ) 25 MG capsule Take 2 capsules (50 mg total) by mouth at bedtime.   omeprazole  (PRILOSEC) 40 MG capsule Take 1 capsule (40 mg total) by mouth daily.   polyethylene glycol (MIRALAX ) 17 g packet Take 17 g by mouth daily as needed for mild constipation or moderate constipation.   potassium chloride  SA (KLOR-CON  M20) 20 MEQ tablet Take 2 tablets (40 mEq total) by mouth daily.   rosuvastatin  (CRESTOR ) 20 MG tablet Take 1 tablet (20 mg total) by mouth daily.   spironolactone  (ALDACTONE ) 25 MG tablet Take 0.5 tablets (12.5  mg total) by mouth daily.   traMADol  (ULTRAM ) 50 MG tablet Take 1 tablet (50 mg total) by mouth every 8 (eight) hours as needed.   No facility-administered encounter medications on file as of 03/25/2023.    Allergies (verified) Methylprednisolone , Shellfish allergy, Ace inhibitors, Gabapentin , Pitavastatin, Sulfa antibiotics, and Topiramate    History: Past Medical History:  Diagnosis Date   Achalasia 09/28/2012   Allergic rhinitis 03/01/2015   Anemia    Aneurysm (HCC) 03/01/2015   stable 5 mm periopthalmic right ICA aneurysm  03/25/21 CTA   BP (high blood pressure) 03/01/2015   Bronchitis 04/23/2018   Cephalalgia 08/24/2012   Overview:  ICD-10 cut over     Chest pain 04/04/2011   Coronary Ca Score 0, no significant CAD 08/20/18; CP with troponin 4>32 s/p LHC showing normal coronaries 12/29/18   CN (constipation) 09/28/2012   Coughing 04/24/2017   Decreased potassium in the blood 03/01/2015   Diverticulosis    Dizziness 03/01/2015   Erythropoietin  deficiency anemia 03/13/2021   GERD (gastroesophageal reflux disease)    Hiatal hernia    Hypercholesterolemia 03/01/2015   Hyperlipidemia    Hypertension    Ingrown toenail 08/10/2017   Inguinal hernia    Iron deficiency anemia 03/13/2021   Migraine headache    Onychomycosis 12/08/2017   Paresthesia of arm 03/01/2015   PE (pulmonary thromboembolism) (HCC) 03/13/2020   in setting of + COVID-19, s/p Eliquis  x 3 month   Schatzki's ring    Tendonitis, Achilles, right 12/08/2017   Unilateral primary osteoarthritis, left knee 10/07/2017   Past Surgical History:  Procedure Laterality Date   ABDOMINAL HYSTERECTOMY     BACK SURGERY     x 3   CARDIAC CATHETERIZATION  12/29/2018   angiographically normal coronary arteries 12/29/18 ([place])   CARPAL TUNNEL RELEASE     left wrist   CERVICAL SPINE SURGERY     HEMORRHOID SURGERY     TOTAL HIP ARTHROPLASTY Left 09/04/2021   Procedure: LEFT TOTAL HIP ARTHROPLASTY ANTERIOR APPROACH;  Surgeon: Vernetta Lonni GRADE, MD;  Location: [facility] OR;  Service: Orthopedics;  Laterality: Left;   Family History  Problem Relation Age of Onset   Allergic rhinitis Sister    Diabetes Sister    Hypertension Sister    Heart attack Father 28   Heart disease Father    Stomach cancer Maternal Grandmother    Heart disease Mother    Colon cancer Neg Hx    Angioedema Neg Hx    Asthma Neg Hx    Eczema Neg Hx    Urticaria Neg Hx    Immunodeficiency Neg Hx    Breast cancer Neg Hx    Migraines Neg Hx    Headache Neg Hx    Social  History   Socioeconomic History   Marital status: Divorced    Spouse name: Not on file   Number of children: 2   Years of education: Not on file   Highest education level: Some college, no degree  Occupational History   Occupation: Lobbyist: [id number]  Tobacco Use   Smoking status: Former    Current packs/day: 0.00    Average packs/day: 0.5 packs/day for 20.0 years (10.0 ttl pk-yrs)    Types: Cigarettes    Start date: 02/19/1964    Quit date: 02/19/1984    Years since quitting: 39.1   Smokeless tobacco: Never  Vaping Use   Vaping status: Never Used  Substance and Sexual Activity   Alcohol  use: No  Drug use: No   Sexual activity: Yes    Partners: Male  Other Topics Concern   Not on file  Social History Narrative   Lives alone   Caffeine use: Coffee one cup daily   Right handed    Son is her next of kin   Working at [employer] Strain: Low Risk  (03/25/2023)   Overall Financial Resource Strain (CARDIA)    Difficulty of Paying Living Expenses: Not hard at all  Food Insecurity: No Food Insecurity (03/25/2023)   Hunger Vital Sign    Worried About Running Out of Food in the Last Year: Never true    Ran Out of Food in the Last Year: Never true  Transportation Needs: No Transportation Needs (03/25/2023)   PRAPARE - Administrator, Civil Service (Medical): No    Lack of Transportation (Non-Medical): No  Physical Activity: Inactive (03/25/2023)   Exercise Vital Sign    Days of Exercise per Week: 0 days    Minutes of Exercise per Session: 0 min  Stress: No Stress Concern Present (03/25/2023)   [employer] of Occupational Health - Occupational Stress Questionnaire    Feeling of Stress : Not at all  Recent Concern: Stress - Stress Concern Present (03/12/2023)   [employer] of Occupational Health - Occupational Stress Questionnaire    Feeling of Stress : To some extent  Social Connections: Moderately Integrated  (03/25/2023)   Social Connection and Isolation Panel [NHANES]    Frequency of Communication with Friends and Family: More than three times a week    Frequency of Social Gatherings with Friends and Family: More than three times a week    Attends Religious Services: More than 4 times per year    Active Member of [employer] or Organizations: Yes    Attends Engineer, Structural: More than 4 times per year    Marital Status: Divorced    Tobacco Counseling Counseling given: Not Answered   Clinical Intake:  Pre-visit preparation completed: Yes  Pain : No/denies pain     BMI - recorded: 38.62 Nutritional Status: BMI > 30  Obese Nutritional Risks: None Diabetes: No  How often do you need to have someone help you when you read instructions, pamphlets, or other written materials from your doctor or pharmacy?: 1 - Never  Interpreter Needed?: No  Information entered by :: Rojelio Blush LPN   Activities of Daily Living    03/25/2023    4:14 PM 03/24/2023   11:10 AM  In your present state of health, do you have any difficulty performing the following activities:  Hearing? 0 0  Vision? 0 0  Difficulty concentrating or making decisions? 0 0  Walking or climbing stairs? 0 0  Dressing or bathing? 0 0  Doing errands, shopping? 0 0  Preparing Food and eating ? N N  Using the Toilet? N N  In the past six months, have you accidently leaked urine? Y Y  Do you have problems with loss of bowel control? N N  Managing your Medications? N N  Managing your Finances? N N  Housekeeping or managing your Housekeeping? N N    Patient Care Team: Copland, Harlene BROCKS, MD as PCP - General (Family Medicine) Zhao, Jin, MD as PCP - Cardiology (Cardiology) Gillie Duncans, MD as Consulting Physician (Neurosurgery) Carla Milling, RPH-CPP (Pharmacist)  Indicate any recent Medical Services you may have received from other than [facility] providers in  the past year (date may be approximate).     Assessment:    This is a routine wellness examination for [facility].  Hearing/Vision screen Hearing Screening - Comments:: Denies hearing difficulties   Vision Screening - Comments:: Wears rx glasses - up to date with routine eye exams with  [facility]   Goals Addressed               This Visit's Progress     Increase physical activity (pt-stated)        Stay Active.       Depression Screen    03/25/2023    4:14 PM 03/20/2022   10:27 AM 02/06/2021    2:04 PM 10/03/2020    5:16 PM 04/10/2020    3:49 PM 12/08/2018    4:56 PM 08/20/2017    8:56 AM  PHQ 2/9 Scores  PHQ - 2 Score 0 1 0 0 3 4 0  PHQ- 9 Score     6 9     Fall Risk    03/25/2023    4:15 PM 03/24/2023   11:10 AM 07/23/2022    5:49 PM 03/20/2022   10:24 AM 02/06/2022   11:06 AM  Fall Risk   Falls in the past year? 0 0 1 0 0  Number falls in past yr: 0  0 0 0  Injury with Fall? 0  0 0 0  Risk for fall due to : No Fall Risks  Other (Comment) No Fall Risks No Fall Risks  Follow up Falls prevention discussed   Falls evaluation completed Falls evaluation completed    MEDICARE RISK AT HOME: Medicare Risk at Home Any stairs in or around the home?: Yes If so, are there any without handrails?: No Home free of loose throw rugs in walkways, pet beds, electrical cords, etc?: Yes Adequate lighting in your home to reduce risk of falls?: Yes Life alert?: No Use of a cane, walker or w/c?: Yes Grab bars in the bathroom?: No Shower chair or bench in shower?: Yes Elevated toilet seat or a handicapped toilet?: Yes  TIMED UP AND GO:  Was the test performed?  No    Cognitive Function:        03/25/2023    4:15 PM 03/20/2022   10:34 AM  6CIT Screen  What Year? 0 points 0 points  What month? 0 points 0 points  What time? 0 points 0 points  Count back from 20 0 points 2 points  Months in reverse 0 points 0 points  Repeat phrase 0 points 8 points  Total Score 0 points 10 points    Immunizations Immunization History   Administered Date(s) Administered   Fluad  Trivalent(High Dose 65+) 10/30/2022   PFIZER(Purple Top)SARS-COV-2 Vaccination 06/07/2019, 06/30/2019    TDAP status: Due, Education has been provided regarding the importance of this vaccine. Advised may receive this vaccine at local pharmacy or Health Dept. Aware to provide a copy of the vaccination record if obtained from local pharmacy or Health Dept. Verbalized acceptance and understanding.  Flu Vaccine status: Up to date  Pneumococcal vaccine status: Due, Education has been provided regarding the importance of this vaccine. Advised may receive this vaccine at local pharmacy or Health Dept. Aware to provide a copy of the vaccination record if obtained from local pharmacy or Health Dept. Verbalized acceptance and understanding.    Qualifies for Shingles Vaccine? Yes   Zostavax completed No   Shingrix Completed?: No.    Education has been  provided regarding the importance of this vaccine. Patient has been advised to call insurance company to determine out of pocket expense if they have not yet received this vaccine. Advised may also receive vaccine at local pharmacy or Health Dept. Verbalized acceptance and understanding.  Screening Tests Health Maintenance  Topic Date Due   Pneumonia Vaccine 56+ Years old (1 of 2 - PCV) Never done   DTaP/Tdap/Td (1 - Tdap) Never done   Zoster Vaccines- Shingrix (1 of 2) Never done   Colonoscopy  02/19/2021   Medicare Annual Wellness (AWV)  03/24/2024   INFLUENZA VACCINE  Completed   DEXA SCAN  Completed   Hepatitis C Screening  Completed   HPV VACCINES  Aged Out   COVID-19 Vaccine  Discontinued    Health Maintenance  Health Maintenance Due  Topic Date Due   Pneumonia Vaccine 81+ Years old (1 of 2 - PCV) Never done   DTaP/Tdap/Td (1 - Tdap) Never done   Zoster Vaccines- Shingrix (1 of 2) Never done   Colonoscopy  02/19/2021    Colorectal cancer screening: Referral to GI placed 03/25/23. Pt aware  the office will call re: appt.    Bone Density status: Completed 04/09/22. Results reflect: Bone density results: OSTEOPENIA. Repeat every   years.     Additional Screening:  Hepatitis C Screening: does qualify; Completed 03/12/20  Vision Screening: Recommended annual ophthalmology exams for early detection of glaucoma and other disorders of the eye. Is the patient up to date with their annual eye exam?  Yes  Who is the provider or what is the name of the office in which the patient attends annual eye exams? [facility] If pt is not established with a provider, would they like to be referred to a provider to establish care? No .   Dental Screening: Recommended annual dental exams for proper oral hygiene   Community Resource Referral / Chronic Care Management:  CRR required this visit?  No   CCM required this visit?  No     Plan:     I have personally reviewed and noted the following in the patient's chart:   Medical and social history Use of alcohol , tobacco or illicit drugs  Current medications and supplements including opioid prescriptions. Patient is currently taking opioid prescriptions. Information provided to patient regarding non-opioid alternatives. Patient advised to discuss non-opioid treatment plan with their provider. Functional ability and status Nutritional status Physical activity Advanced directives List of other physicians Hospitalizations, surgeries, and ER visits in previous 12 months Vitals Screenings to include cognitive, depression, and falls Referrals and appointments  In addition, I have reviewed and discussed with patient certain preventive protocols, quality metrics, and best practice recommendations. A written personalized care plan for preventive services as well as general preventive health recommendations were provided to patient.     Rojelio LELON Blush, LPN   08/22/7972   After Visit Summary: (MyChart) Due to this being a telephonic  visit, the after visit summary with patients personalized plan was offered to patient via MyChart   Nurse Notes: None

## 2023-03-25 NOTE — Patient Instructions (Addendum)
Stephanie Parks , Thank you for taking time to come for your Medicare Wellness Visit. I appreciate your ongoing commitment to your health goals. Please review the following plan we discussed and let me know if I can assist you in the future.   Referrals/Orders/Follow-Ups/Clinician Recommendations: [facility] [address] [place] [address] [place],  [place]  [address] Main: [phone]   This is a list of the screening recommended for you and due dates:  Health Maintenance  Topic Date Due   Pneumonia Vaccine (1 of 2 - PCV) Never done   DTaP/Tdap/Td vaccine (1 - Tdap) Never done   Zoster (Shingles) Vaccine (1 of 2) Never done   Colon Cancer Screening  02/19/2021   Medicare Annual Wellness Visit  03/24/2024   Flu Shot  Completed   DEXA scan (bone density measurement)  Completed   Hepatitis C Screening  Completed   HPV Vaccine  Aged Out   COVID-19 Vaccine  Discontinued   Opioid Pain Medicine Management Opioids are powerful medicines that are used to treat moderate to severe pain. When used for short periods of time, they can help you to: Sleep better. Do better in physical or occupational therapy. Feel better in the first few days after an injury. Recover from surgery. Opioids should be taken with the supervision of a trained health care provider. They should be taken for the shortest period of time possible. This is because opioids can be addictive, and the longer you take opioids, the greater your risk of addiction. This addiction can also be called opioid use disorder. What are the risks? Using opioid pain medicines for longer than 3 days increases your risk of side effects. Side effects include: Constipation. Nausea and vomiting. Breathing difficulties (respiratory depression). Drowsiness. Confusion. Opioid use disorder. Itching. Taking opioid pain medicine for a long period of time can affect your ability to do daily tasks. It also puts you at risk for: Motor vehicle  crashes. Depression. Suicide. Heart attack. Overdose, which can be life-threatening. What is a pain treatment plan? A pain treatment plan is an agreement between you and your health care provider. Pain is unique to each person, and treatments vary depending on your condition. To manage your pain, you and your health care provider need to work together. To help you do this: Discuss the goals of your treatment, including how much pain you might expect to have and how you will manage the pain. Review the risks and benefits of taking opioid medicines. Remember that a good treatment plan uses more than one approach and minimizes the chance of side effects. Be honest about the amount of medicines you take and about any drug or alcohol use. Get pain medicine prescriptions from only one health care provider. Pain can be managed with many types of alternative treatments. Ask your health care provider to refer you to one or more specialists who can help you manage pain through: Physical or occupational therapy. Counseling (cognitive behavioral therapy). Good nutrition. Biofeedback. Massage. Meditation. Non-opioid medicine. Following a gentle exercise program. How to use opioid pain medicine Taking medicine Take your pain medicine exactly as told by your health care provider. Take it only when you need it. If your pain gets less severe, you may take less than your prescribed dose if your health care provider approves. If you are not having pain, do nottake pain medicine unless your health care provider tells you to take it. If your pain is severe, do nottry to treat it yourself  by taking more pills than instructed on your prescription. Contact your health care provider for help. Write down the times when you take your pain medicine. It is easy to become confused while on pain medicine. Writing the time can help you avoid overdose. Take other over-the-counter or prescription medicines only as told by  your health care provider. Keeping yourself and others safe  While you are taking opioid pain medicine: Do not drive, use machinery, or power tools. Do not sign legal documents. Do not drink alcohol. Do not take sleeping pills. Do not supervise children by yourself. Do not do activities that require climbing or being in high places. Do not go to a lake, river, ocean, spa, or swimming pool. Do not share your pain medicine with anyone. Keep pain medicine in a locked cabinet or in a secure area where pets and children cannot reach it. Stopping your use of opioids If you have been taking opioid medicine for more than a few weeks, you may need to slowly decrease (taper) how much you take until you stop completely. Tapering your use of opioids can decrease your risk of symptoms of withdrawal, such as: Pain and cramping in the abdomen. Nausea. Sweating. Sleepiness. Restlessness. Uncontrollable shaking (tremors). Cravings for the medicine. Do not attempt to taper your use of opioids on your own. Talk with your health care provider about how to do this. Your health care provider may prescribe a step-down schedule based on how much medicine you are taking and how long you have been taking it. Getting rid of leftover pills Do not save any leftover pills. Get rid of leftover pills safely by: Taking the medicine to a prescription take-back program. This is usually offered by the county or law enforcement. Bringing them to a pharmacy that has a drug disposal container. Flushing them down the toilet. Check the label or package insert of your medicine to see whether this is safe to do. Throwing them out in the trash. Check the label or package insert of your medicine to see whether this is safe to do. If it is safe to throw it out, remove the medicine from the original container, put it into a sealable bag or container, and mix it with used coffee grounds, food scraps, dirt, or cat litter before putting  it in the trash. Follow these instructions at home: Activity Do exercises as told by your health care provider. Avoid activities that make your pain worse. Return to your normal activities as told by your health care provider. Ask your health care provider what activities are safe for you. General instructions You may need to take these actions to prevent or treat constipation: Drink enough fluid to keep your urine pale yellow. Take over-the-counter or prescription medicines. Eat foods that are high in fiber, such as beans, whole grains, and fresh fruits and vegetables. Limit foods that are high in fat and processed sugars, such as fried or sweet foods. Keep all follow-up visits. This is important. Where to find support If you have been taking opioids for a long time, you may benefit from receiving support for quitting from a local support group or counselor. Ask your health care provider for a referral to these resources in your area. Where to find more information Centers for Disease Control and Prevention (CDC): FootballExhibition.com.br [place] Food and Drug Administration (FDA): PumpkinSearch.com.ee Get help right away if: You may have taken too much of an opioid (overdosed). Common symptoms of an overdose: Your breathing is slower or  more shallow than normal. You have a very slow heartbeat (pulse). You have slurred speech. You have nausea and vomiting. Your pupils become very small. You have other potential symptoms: You are very confused. You faint or feel like you will faint. You have cold, clammy skin. You have blue lips or fingernails. You have thoughts of harming yourself or harming others. These symptoms may represent a serious problem that is an emergency. Do not wait to see if the symptoms will go away. Get medical help right away. Call your local emergency services (911 in the [place] Do not drive yourself to the hospital.  If you ever feel like you may hurt yourself or others, or have thoughts  about taking your own life, get help right away. Go to your nearest emergency department or: Call your local emergency services (911 in the [place] Call the [facility] (416-476-8474 in the [place] Call a suicide crisis helpline, such as the National Suicide Prevention Lifeline at 650 552 0967 or 988 in the [place] This is open 24 hours a day in the [place] If you're a Veteran: Call 988 and press 1. This is open 24 hours a day. Text the [employer] at 406-401-2649. Summary Opioid medicines can help you manage moderate to severe pain for a short period of time. A pain treatment plan is an agreement between you and your health care provider. Discuss the goals of your treatment, including how much pain you might expect to have and how you will manage the pain. If you think that you or someone else may have taken too much of an opioid, get medical help right away. This information is not intended to replace advice given to you by your health care provider. Make sure you discuss any questions you have with your health care provider. Document Revised: 11/11/2022 Document Reviewed: 05/17/2020 Elsevier Patient Education  2024 Elsevier Inc. Advanced directives: (Provided) Advance directive discussed with you today. I have provided a copy for you to complete at home and have notarized. Once this is complete, please bring a copy in to our office so we can scan it into your chart.   Next Medicare Annual Wellness Visit scheduled for next year: Yes

## 2023-03-26 ENCOUNTER — Other Ambulatory Visit (HOSPITAL_BASED_OUTPATIENT_CLINIC_OR_DEPARTMENT_OTHER): Payer: Self-pay

## 2023-03-26 ENCOUNTER — Telehealth: Payer: Self-pay | Admitting: Emergency Medicine

## 2023-03-26 MED ORDER — PREVNAR 20 0.5 ML IM SUSY
PREFILLED_SYRINGE | INTRAMUSCULAR | 0 refills | Status: DC
  Filled 2023-03-26: qty 0.5, 1d supply, fill #0

## 2023-03-26 NOTE — Telephone Encounter (Signed)
Copied from CRM [id number]. Topic: General - Other >> Mar 26, 2023 11:34 AM Elizebeth Brooking wrote: Reason for CRM: Patient called in regarding wanting a callback from Dr.Copland or Nurses

## 2023-03-27 ENCOUNTER — Ambulatory Visit: Payer: Medicare Other | Admitting: Family Medicine

## 2023-03-31 ENCOUNTER — Encounter: Payer: Medicare Other | Admitting: Physical Medicine and Rehabilitation

## 2023-04-01 ENCOUNTER — Other Ambulatory Visit: Payer: Self-pay | Admitting: Orthopaedic Surgery

## 2023-04-02 ENCOUNTER — Ambulatory Visit: Payer: Medicare Other | Admitting: Physical Medicine and Rehabilitation

## 2023-04-02 ENCOUNTER — Other Ambulatory Visit: Payer: Self-pay

## 2023-04-02 VITALS — BP 155/80 | HR 90

## 2023-04-02 DIAGNOSIS — M5416 Radiculopathy, lumbar region: Secondary | ICD-10-CM

## 2023-04-02 MED ORDER — METHYLPREDNISOLONE ACETATE 40 MG/ML IJ SUSP
40.0000 mg | Freq: Once | INTRAMUSCULAR | Status: AC
  Administered 2023-04-02: 40 mg

## 2023-04-02 NOTE — Progress Notes (Signed)
Pain Scale---10 No allergies to Contrast dye Patient informed she takes Eliquis

## 2023-04-02 NOTE — Progress Notes (Signed)
 Stephanie Parks - 78 y.o. female MRN [id number]  Date of birth: Jul 08, 1945  Office Visit Note: Visit Date: 04/02/2023 PCP: Pearline Cables, MD Referred by: Pearline Cables, MD  Subjective: Chief Complaint  Patient presents with   Lower Back - Pain   HPI:  Stephanie Parks is a 78 y.o. female who comes in today at the request of Dr. Doneen Poisson for planned Left L3-4 Lumbar Transforaminal epidural steroid injection with fluoroscopic guidance.  The patient has failed conservative care including home exercise, medications, time and activity modification.  This injection will be diagnostic and hopefully therapeutic.  Please see requesting physician notes for further details and justification.  ** She was adamant that she wanted the injection on the right as she was only have right side pain. I did complete a right L3 transforaminal injection.   ROS Otherwise per HPI.  Assessment & Plan: Visit Diagnoses:    ICD-10-CM   1. Radiculopathy, lumbar region  M54.16 XR C-ARM NO REPORT    Epidural Steroid injection    methylPREDNISolone acetate (DEPO-MEDROL) injection 40 mg      Plan: No additional findings.   Meds & Orders:  Meds ordered this encounter  Medications   methylPREDNISolone acetate (DEPO-MEDROL) injection 40 mg    Orders Placed This Encounter  Procedures   XR C-ARM NO REPORT   Epidural Steroid injection    Follow-up: Return for visit to requesting provider as needed.   Procedures: No procedures performed  Lumbosacral Transforaminal Epidural Steroid Injection - Sub-Pedicular Approach with Fluoroscopic Guidance  Patient: Stephanie Parks      Date of Birth: [birth year] MRN: [id number] PCP: Pearline Cables, MD      Visit Date: 04/02/2023   Universal Protocol:    Date/Time: 04/02/2023  Consent Given By: the patient  Position: PRONE  Additional Comments: Vital signs were monitored before and after the procedure. Patient was prepped and draped in  the usual sterile fashion. The correct patient, procedure, and site was verified.   Injection Procedure Details:   Procedure diagnoses: Radiculopathy, lumbar region [M54.16]    Meds Administered:  Meds ordered this encounter  Medications   methylPREDNISolone acetate (DEPO-MEDROL) injection 40 mg    Laterality: Right  Location/Site: L3  Needle:5.0 in., 22 ga.  Short bevel or Quincke spinal needle  Needle Placement: Transforaminal  Findings:    -Comments: Excellent flow of contrast along the nerve, nerve root and into the epidural space.  Procedure Details: After squaring off the end-plates to get a true AP view, the C-arm was positioned so that an oblique view of the foramen as noted above was visualized. The target area is just inferior to the "nose of the scotty dog" or sub pedicular. The soft tissues overlying this structure were infiltrated with 2-3 ml. of 1% Lidocaine without Epinephrine.  The spinal needle was inserted toward the target using a "trajectory" view along the fluoroscope beam.  Under AP and lateral visualization, the needle was advanced so it did not puncture dura and was located close the 6 O'Clock position of the pedical in AP tracterory. Biplanar projections were used to confirm position. Aspiration was confirmed to be negative for CSF and/or blood. A 1-2 ml. volume of Isovue-250 was injected and flow of contrast was noted at each level. Radiographs were obtained for documentation purposes.   After attaining the desired flow of contrast documented above, a 0.5 to 1.0 ml test dose of 0.25% Marcaine was injected into each respective transforaminal  space.  The patient was observed for 90 seconds post injection.  After no sensory deficits were reported, and normal lower extremity motor function was noted,   the above injectate was administered so that equal amounts of the injectate were placed at each foramen (level) into the transforaminal epidural  space.   Additional Comments:  The patient tolerated the procedure well Dressing: 2 x 2 sterile gauze and Band-Aid    Post-procedure details: Patient was observed during the procedure. Post-procedure instructions were reviewed.  Patient left the clinic in stable condition.    Clinical History: MRI LUMBAR SPINE WITHOUT CONTRAST   TECHNIQUE:    FINDINGS: Segmentation: Counting down from T1 on prior CT chest, there only segmented transverse processes versus tiny ribs at T12. Distal to this, the next 5 vertebral bodies are considered L1 through L5. There is partial sacralization of L5. This is in agreement with the numbering nomenclature on the prior 01/20/2020 CT myelogram of the lumbar spine.   Alignment: 3 mm retrolisthesis of T12 on L1 appears similar to prior. 2 mm retrolisthesis of T11 on T12 is minimally increased from 1 mm previously. Mild levocurvature centered at L1 is similar to prior.   Vertebrae: Vertebral body heights are maintained. No acute fracture or destructive bone lesion.   Moderate anterior T11-12 and mild anterior T10-11 disc space narrowing. Mild-to-moderate posterior T12-L1, moderate to severe right L3-4, and mild-to-moderate left L4-5 disc space narrowing.   Conus medullaris and cauda equina: Conus extends to the superior L2 level, unchanged. Conus and cauda equina appear normal.   Paraspinal and other soft tissues: Limited images of the retroperitoneum are unremarkable.   Disc levels:   T9-10: Seen on sagittal images only. Facet joint spurring and hypertrophy contributes to mild-to-moderate left neuroforaminal stenosis. This level was not previously imaged.   T10-11: Seen on sagittal images only. Mild broad-based posterior disc bulge and mild bilateral facet joint hypertrophy with very mild bilateral neuroforaminal narrowing but no true stenosis. No significant change.   T11-12: Mild bilateral facet joint hypertrophy. Mild left  greater than right posterior disc bulge. Mild left lateral recess stenosis. Mild right neuroforaminal stenosis, unchanged. No central canal stenosis.   T12-L1: Mild-to-moderate bilateral facet joint hypertrophy. Mild retrolisthesis. Mild broad-based posterior disc osteophyte complex. Severe right and moderate left neuroforaminal stenosis, slightly worsened from 08/05/2019 MRI. No central canal stenosis.   L1-2: Moderate bilateral facet joint hypertrophy. Mild broad-based posterior disc osteophyte complex. Mild left neuroforaminal stenosis is minimally worsened from prior. No central canal stenosis.   L2-3: Moderate right and mild-to-moderate left facet joint hypertrophy. Mild left-greater-than-right posterior disc bulge with bilateral intraforaminal extension. Mild right neuroforaminal stenosis, unchanged. No central canal stenosis.   L3-4: Mild-to-moderate bilateral facet joint hypertrophy. Mild broad-based posterior disc bulge. No central canal stenosis. Moderate to high-grade right intraforaminal disc osteophyte extension with severe right neuroforaminal stenosis, similar to prior. Mild left intraforaminal disc extension with mild left neuroforaminal stenosis, unchanged.   L4-5: Redemonstration of right hemilaminotomy. Moderate to severe left and mild-to-moderate right facet joint hypertrophy. Mild-to-moderate broad-based posterior disc osteophyte complex mildly contacts the left-greater-than-right descending L5 nerves. No central canal stenosis. Moderate left intraforaminal disc osteophyte extension with moderate left neuroforaminal stenosis, not significantly changed from prior.   L5-S1: Rudimentary disc. No posterior disc bulge, central canal narrowing, or neuroforaminal stenosis.   IMPRESSION: Compared to 08/05/2019:   1. Transitional lumbosacral anatomy. Recommend correlation with the numbering nomenclature in this report prior to any image guided invasive procedure.  Multilevel  degenerative disc and joint changes as above. 2. Multilevel neuroforaminal stenosis, including mildly worsened severe right and moderate left T12-L1, unchanged severe right L3-4, and unchanged moderate left L4-5 neuroforaminal stenosis.     Electronically Signed   By: Neita Garnet M.D.   On: 04/17/2022 16:47     Objective:  VS:  HT:    WT:   BMI:     BP:(!) 155/80  HR:90bpm  TEMP: ( )  RESP:  Physical Exam Vitals and nursing note reviewed.  Constitutional:      General: She is not in acute distress.    Appearance: Normal appearance. She is obese. She is not ill-appearing.  HENT:     Head: Normocephalic and atraumatic.     Right Ear: External ear normal.     Left Ear: External ear normal.  Eyes:     Extraocular Movements: Extraocular movements intact.  Cardiovascular:     Rate and Rhythm: Normal rate.     Pulses: Normal pulses.  Pulmonary:     Effort: Pulmonary effort is normal. No respiratory distress.  Abdominal:     General: There is no distension.     Palpations: Abdomen is soft.  Musculoskeletal:        General: Tenderness present.     Cervical back: Neck supple.     Right lower leg: No edema.     Left lower leg: No edema.     Comments: Patient has good distal strength with no pain over the greater trochanters.  No clonus or focal weakness.  Skin:    Findings: No erythema, lesion or rash.  Neurological:     General: No focal deficit present.     Mental Status: She is alert and oriented to person, place, and time.     Sensory: No sensory deficit.     Motor: No weakness or abnormal muscle tone.     Coordination: Coordination normal.  Psychiatric:        Mood and Affect: Mood normal.        Behavior: Behavior normal.      Imaging: No results found.

## 2023-04-02 NOTE — Patient Instructions (Signed)

## 2023-04-03 MED ORDER — ROSUVASTATIN CALCIUM 20 MG PO TABS: 20.0000 mg | ORAL_TABLET | Freq: Every day | ORAL | 3 refills | Status: DC

## 2023-04-03 NOTE — Addendum Note (Signed)
Addended by: Abbe Amsterdam C on: 04/03/2023 01:00 PM   Modules accepted: Orders

## 2023-04-07 ENCOUNTER — Ambulatory Visit (INDEPENDENT_AMBULATORY_CARE_PROVIDER_SITE_OTHER): Payer: Medicare Other

## 2023-04-07 DIAGNOSIS — J309 Allergic rhinitis, unspecified: Secondary | ICD-10-CM

## 2023-04-08 DIAGNOSIS — R002 Palpitations: Secondary | ICD-10-CM | POA: Diagnosis not present

## 2023-04-08 DIAGNOSIS — R0609 Other forms of dyspnea: Secondary | ICD-10-CM | POA: Diagnosis not present

## 2023-04-08 DIAGNOSIS — I824Y9 Acute embolism and thrombosis of unspecified deep veins of unspecified proximal lower extremity: Secondary | ICD-10-CM | POA: Diagnosis not present

## 2023-04-08 DIAGNOSIS — I2699 Other pulmonary embolism without acute cor pulmonale: Secondary | ICD-10-CM | POA: Diagnosis not present

## 2023-04-08 DIAGNOSIS — R079 Chest pain, unspecified: Secondary | ICD-10-CM | POA: Diagnosis not present

## 2023-04-14 NOTE — Procedures (Signed)
 Lumbosacral Transforaminal Epidural Steroid Injection - Sub-Pedicular Approach with Fluoroscopic Guidance  Patient: Stephanie Parks      Date of Birth: [birth year] MRN: [id number] PCP: Pearline Cables, MD      Visit Date: 04/02/2023   Universal Protocol:    Date/Time: 04/02/2023  Consent Given By: the patient  Position: PRONE  Additional Comments: Vital signs were monitored before and after the procedure. Patient was prepped and draped in the usual sterile fashion. The correct patient, procedure, and site was verified.   Injection Procedure Details:   Procedure diagnoses: Radiculopathy, lumbar region [M54.16]    Meds Administered:  Meds ordered this encounter  Medications   methylPREDNISolone acetate (DEPO-MEDROL) injection 40 mg    Laterality: Right  Location/Site: L3  Needle:5.0 in., 22 ga.  Short bevel or Quincke spinal needle  Needle Placement: Transforaminal  Findings:    -Comments: Excellent flow of contrast along the nerve, nerve root and into the epidural space.  Procedure Details: After squaring off the end-plates to get a true AP view, the C-arm was positioned so that an oblique view of the foramen as noted above was visualized. The target area is just inferior to the "nose of the scotty dog" or sub pedicular. The soft tissues overlying this structure were infiltrated with 2-3 ml. of 1% Lidocaine without Epinephrine.  The spinal needle was inserted toward the target using a "trajectory" view along the fluoroscope beam.  Under AP and lateral visualization, the needle was advanced so it did not puncture dura and was located close the 6 O'Clock position of the pedical in AP tracterory. Biplanar projections were used to confirm position. Aspiration was confirmed to be negative for CSF and/or blood. A 1-2 ml. volume of Isovue-250 was injected and flow of contrast was noted at each level. Radiographs were obtained for documentation purposes.   After attaining the  desired flow of contrast documented above, a 0.5 to 1.0 ml test dose of 0.25% Marcaine was injected into each respective transforaminal space.  The patient was observed for 90 seconds post injection.  After no sensory deficits were reported, and normal lower extremity motor function was noted,   the above injectate was administered so that equal amounts of the injectate were placed at each foramen (level) into the transforaminal epidural space.   Additional Comments:  The patient tolerated the procedure well Dressing: 2 x 2 sterile gauze and Band-Aid    Post-procedure details: Patient was observed during the procedure. Post-procedure instructions were reviewed.  Patient left the clinic in stable condition.

## 2023-04-16 DIAGNOSIS — R0609 Other forms of dyspnea: Secondary | ICD-10-CM | POA: Diagnosis not present

## 2023-04-16 DIAGNOSIS — R079 Chest pain, unspecified: Secondary | ICD-10-CM | POA: Diagnosis not present

## 2023-04-28 ENCOUNTER — Ambulatory Visit
Admission: EM | Admit: 2023-04-28 | Discharge: 2023-04-28 | Disposition: A | Discharge status: Home / Self Care | Attending: Family Medicine | Admitting: Family Medicine

## 2023-04-28 ENCOUNTER — Other Ambulatory Visit (HOSPITAL_BASED_OUTPATIENT_CLINIC_OR_DEPARTMENT_OTHER): Payer: Self-pay

## 2023-04-28 DIAGNOSIS — Q386 Other congenital malformations of mouth: Secondary | ICD-10-CM

## 2023-04-28 NOTE — Discharge Instructions (Signed)
 Contact your ENT specialist today for a consultation regarding an elongated uvula. If you start having significant difficulty breathing then go to the emergency room.

## 2023-04-28 NOTE — ED Provider Notes (Signed)
 [facility]  Note:  This document was prepared using Conservation officer, historic buildings and may include unintentional dictation errors.  MRN: [id number] DOB: 1945-09-24  Subjective:   Stephanie Parks is a 78 y.o. female presenting for 1 day history of globus sensation in her throat. This started after she tried to take her pills. No shob, stridor, wheezing. She ended up forcing herself to vomit which help her symptoms. Today, she cut her K+ pill in half and tried to swallow that and ended up with the same sensation.   No current facility-administered medications for this encounter.  Current Outpatient Medications:    albuterol (VENTOLIN HFA) 108 (90 Base) MCG/ACT inhaler, Inhale 2 puffs into the lungs every 6 (six) hours as needed., Disp: 18 g, Rfl: 0   apixaban (ELIQUIS) 5 MG TABS tablet, Take 1 tablet (5 mg total) by mouth 2 (two) times daily., Disp: 60 tablet, Rfl: 0   Calcium Carb-Cholecalciferol (CALCIUM 600 + D PO), Take 1 tablet by mouth daily., Disp: , Rfl:    carboxymethylcellulose (REFRESH PLUS) 0.5 % SOLN, Place 1 drop into both eyes 3 (three) times daily as needed (dry eyes)., Disp: , Rfl:    carvedilol (COREG) 6.25 MG tablet, Take 1 tablet (6.25 mg total) by mouth 2 (two) times daily with a meal., Disp: 180 tablet, Rfl: 1   Cyanocobalamin (B-12 PO), Take 2 tablets by mouth daily., Disp: , Rfl:    ELDERBERRY PO, Take 1 capsule by mouth daily., Disp: , Rfl:    EPINEPHrine (EPIPEN 2-PAK) 0.3 mg/0.3 mL IJ SOAJ injection, Inject 0.3 mg into the muscle as needed for anaphylaxis. Per allergen immunotherapy protocol, Disp: 1 each, Rfl: 0   fluticasone (FLONASE) 50 MCG/ACT nasal spray, Place 2 sprays into both nostrils daily as needed for allergies., Disp: 48 g, Rfl: 3   losartan-hydrochlorothiazide (HYZAAR) 50-12.5 MG tablet, Take 0.5 tablets by mouth daily., Disp: 45 tablet, Rfl: 3   Multiple Vitamins-Minerals (ALIVE MULTI-VITAMIN PO), Take 1 tablet by mouth  daily., Disp: , Rfl:    NON FORMULARY, Pt uses a cpap nightly, Disp: , Rfl:    nortriptyline (PAMELOR) 25 MG capsule, Take 2 capsules (50 mg total) by mouth at bedtime., Disp: 180 capsule, Rfl: 1   omeprazole (PRILOSEC) 40 MG capsule, Take 1 capsule (40 mg total) by mouth daily., Disp: 90 capsule, Rfl: 3   pneumococcal 20-valent conjugate vaccine (PREVNAR 20) 0.5 ML injection, Inject into the muscle., Disp: 0.5 mL, Rfl: 0   polyethylene glycol (MIRALAX) 17 g packet, Take 17 g by mouth daily as needed for mild constipation or moderate constipation., Disp: 90 packet, Rfl: 2   potassium chloride SA (KLOR-CON M20) 20 MEQ tablet, Take 2 tablets (40 mEq total) by mouth daily., Disp: 180 tablet, Rfl: 1   pregabalin (LYRICA) 75 MG capsule, TAKE 1 CAPSULE BY MOUTH TWICE A DAY, Disp: 60 capsule, Rfl: 0   rosuvastatin (CRESTOR) 20 MG tablet, Take 1 tablet (20 mg total) by mouth daily., Disp: 90 tablet, Rfl: 3   spironolactone (ALDACTONE) 25 MG tablet, Take 0.5 tablets (12.5 mg total) by mouth daily., Disp: 45 tablet, Rfl: 3   traMADol (ULTRAM) 50 MG tablet, Take 1 tablet (50 mg total) by mouth every 8 (eight) hours as needed., Disp: 60 tablet, Rfl: 0   Allergies  Allergen Reactions   Methylprednisolone Other (See Comments)    Lightheaded and "Hallucinations"   Shellfish Allergy Hives and Swelling   Ace Inhibitors Other (See Comments)  Pt cannot recall her reaction but tolerates arb    Gabapentin Other (See Comments)    headaches   Pitavastatin     Unknown reaction  Other Reaction(s): Other (See Comments)  ADVERSE REACTION,  Pt unaware of this reaction; states she has been on crestor in the past with no issues   Sulfa Antibiotics Other (See Comments) and Rash    Fine bumps   Topiramate Palpitations    Heart race,     Past Medical History:  Diagnosis Date   Achalasia 09/28/2012   Allergic rhinitis 03/01/2015   Anemia    Aneurysm (HCC) 03/01/2015   stable 5 mm periopthalmic right ICA  aneurysm 03/25/21 CTA   BP (high blood pressure) 03/01/2015   Bronchitis 04/23/2018   Cephalalgia 08/24/2012   Overview:  ICD-10 cut over     Chest pain 04/04/2011   Coronary Ca Score 0, no significant CAD 08/20/18; CP with troponin 4>32 s/p LHC showing normal coronaries 12/29/18   CN (constipation) 09/28/2012   Coughing 04/24/2017   Decreased potassium in the blood 03/01/2015   Diverticulosis    Dizziness 03/01/2015   Erythropoietin deficiency anemia 03/13/2021   GERD (gastroesophageal reflux disease)    Hiatal hernia    Hypercholesterolemia 03/01/2015   Hyperlipidemia    Hypertension    Ingrown toenail 08/10/2017   Inguinal hernia    Iron deficiency anemia 03/13/2021   Migraine headache    Onychomycosis 12/08/2017   Paresthesia of arm 03/01/2015   PE (pulmonary thromboembolism) (HCC) 03/13/2020   in setting of + COVID-19, s/p Eliquis x 3 month   Schatzki's ring    Tendonitis, Achilles, right 12/08/2017   Unilateral primary osteoarthritis, left knee 10/07/2017     Past Surgical History:  Procedure Laterality Date   ABDOMINAL HYSTERECTOMY     BACK SURGERY     x 3   CARDIAC CATHETERIZATION  12/29/2018   angiographically normal coronary arteries 12/29/18 ([place])   CARPAL TUNNEL RELEASE     left wrist   CERVICAL SPINE SURGERY     HEMORRHOID SURGERY     TOTAL HIP ARTHROPLASTY Left 09/04/2021   Procedure: LEFT TOTAL HIP ARTHROPLASTY ANTERIOR APPROACH;  Surgeon: Kathryne Hitch, MD;  Location: [facility] OR;  Service: Orthopedics;  Laterality: Left;    Family History  Problem Relation Age of Onset   Allergic rhinitis Sister    Diabetes Sister    Hypertension Sister    Heart attack Father 51   Heart disease Father    Stomach cancer Maternal Grandmother    Heart disease Mother    Colon cancer Neg Hx    Angioedema Neg Hx    Asthma Neg Hx    Eczema Neg Hx    Urticaria Neg Hx    Immunodeficiency Neg Hx    Breast cancer Neg Hx    Migraines Neg Hx    Headache Neg Hx      Social History   Tobacco Use   Smoking status: Former    Current packs/day: 0.00    Average packs/day: 0.5 packs/day for 20.0 years (10.0 ttl pk-yrs)    Types: Cigarettes    Start date: 02/19/1964    Quit date: 02/19/1984    Years since quitting: 39.2   Smokeless tobacco: Never  Vaping Use   Vaping status: Never Used  Substance Use Topics   Alcohol use: No   Drug use: No    ROS   Objective:   Vitals: BP (!) 175/90 (BP Location: Right Arm)  Pulse 90   Temp 98 F (36.7 C) (Oral)   Resp 20   SpO2 97%   Physical Exam Constitutional:      General: She is not in acute distress.    Appearance: Normal appearance. She is well-developed. She is not ill-appearing, toxic-appearing or diaphoretic.  HENT:     Head: Normocephalic and atraumatic.     Nose: Nose normal.     Mouth/Throat:     Mouth: Mucous membranes are moist.   Eyes:     General: No scleral icterus.       Right eye: No discharge.        Left eye: No discharge.     Extraocular Movements: Extraocular movements intact.  Neck:     Trachea: No tracheal tenderness, abnormal tracheal secretions or tracheal deviation.  Cardiovascular:     Rate and Rhythm: Normal rate.  Pulmonary:     Effort: Pulmonary effort is normal. No respiratory distress.     Breath sounds: Normal breath sounds. No stridor. No wheezing, rhonchi or rales.  Skin:    General: Skin is warm and dry.  Neurological:     General: No focal deficit present.     Mental Status: She is alert and oriented to person, place, and time.  Psychiatric:        Mood and Affect: Mood normal.        Behavior: Behavior normal.     Assessment and Plan :   PDMP not reviewed this encounter.  1. Long uvula    Patient is speaking in full sentences, controlling secretions.  There is no stridor.  She has an obvious elongated uvula.  Vitals are hemodynamically stable.  Advised that she follow-up with her ENT urgently.  Maintain strict ER precautions.  Counseled  patient on potential for adverse effects with medications prescribed/recommended today, ER and return-to-clinic precautions discussed, patient verbalized understanding.    Wallis Bamberg, [place] 04/28/23 1601

## 2023-04-28 NOTE — ED Triage Notes (Signed)
 Pt states she has 1/2 potassium pill stuck in throat since ~7am-states she had a whole pill stuck in throat yesterday am that she vomited up-NAD-steady gait

## 2023-05-09 ENCOUNTER — Ambulatory Visit (INDEPENDENT_AMBULATORY_CARE_PROVIDER_SITE_OTHER): Payer: Self-pay

## 2023-05-09 DIAGNOSIS — J309 Allergic rhinitis, unspecified: Secondary | ICD-10-CM

## 2023-05-12 DIAGNOSIS — K1379 Other lesions of oral mucosa: Secondary | ICD-10-CM | POA: Diagnosis not present

## 2023-05-15 ENCOUNTER — Other Ambulatory Visit: Payer: Self-pay | Admitting: Family Medicine

## 2023-05-15 MED ORDER — FLUTICASONE PROPIONATE 50 MCG/ACT NA SUSP: 2.0000 | Freq: Every day | NASAL | 1 refills | Status: AC | PRN

## 2023-05-15 NOTE — Telephone Encounter (Signed)
 Copied from CRM [id number]. Topic: Clinical - Medication Refill >> May 15, 2023  2:40 PM Truddie Crumble wrote: Most Recent Primary Care Visit:  Provider: Tillie Rung  Department: [facility]  Visit Type: MEDICARE AWV, SEQUENTIAL  Date: 03/25/2023  Medication: fluticasone (FLONASE) 50 MCG/ACT nasal spray  Has the patient contacted their pharmacy? No (Agent: If no, request that the patient contact the pharmacy for the refill. If patient does not wish to contact the pharmacy document the reason why and proceed with request.) (Agent: If yes, when and what did the pharmacy advise?)  Is this the correct pharmacy for this prescription? Yes If no, delete pharmacy and type the correct one.  This is the patient's preferred pharmacy:  [id number] - [place], [place] - [address] [place] [address] Phone: [phone] Fax: [phone]   Has the prescription been filled recently? No  Is the patient out of the medication? Yes  Has the patient been seen for an appointment in the last year OR does the patient have an upcoming appointment? Yes  Can we respond through MyChart? Yes  Agent: Please be advised that Rx refills may take up to 3 business days. We ask that you follow-up with your pharmacy.

## 2023-05-22 DIAGNOSIS — K5904 Chronic idiopathic constipation: Secondary | ICD-10-CM | POA: Diagnosis not present

## 2023-06-04 NOTE — Patient Instructions (Incomplete)
 It was good to see you again today  Recommended tetanus booster, dose of RSV, shingles vaccine series  We can also consider colon cancer screening if you would like to pursue this

## 2023-06-04 NOTE — Progress Notes (Unsigned)
 [facility] [address] [place], [place] [address] [phone]  Date:  06/05/2023   Name:  Stephanie Parks   DOB:  03-Mar-1945   MRN:  [id number]  PCP:  Pearline Cables, MD    Chief Complaint: No chief complaint on file.   History of Present Illness:  Stephanie FRIESZ is a 78 y.o. very pleasant female patient who presents with the following:  Patient seen today for follow-up, I saw her most recently in January.  Today she is concerned about weight gain and joint pain History of hypertension, hyperlipidemia, allergic rhinitis, OSA on CPAP. She has tended to have frequent ER visits for chest pain likely related to anxiety. She also had COVID-19 complicated by pulmonary embolism January of 2022 -She is no longer using oxygen. Due to recurrent PE in May of 2024 she is now on lifelong eliqus   Lumbar decompression 1998   At her last visit she had some pain in the left side of her trunk-she was worried about a pulmonary embolism.  As above, she is on chronic anticoagulation.  We obtained a D-dimer which was normal for age, chest x-ray benign She is followed by cardiology, Dr. Dion Body  Colon cancer screening Recommend Shingrix, Tdap, RSV  Patient Active Problem List   Diagnosis Date Noted   Multiple subsegmental pulmonary emboli without acute cor pulmonale (HCC) 07/23/2022   TIA (transient ischemic attack) 06/08/2022   Diastolic dysfunction 06/08/2022   Obesity (BMI 30-39.9) 06/08/2022   OSA (obstructive sleep apnea) 06/08/2022   Unilateral primary osteoarthritis, right hip 05/29/2022   Status post total replacement of left hip 09/04/2021   DJD (degenerative joint disease) 09/04/2021   Iron deficiency anemia 03/13/2021   Erythropoietin deficiency anemia 03/13/2021   Unilateral primary osteoarthritis, left hip 10/18/2020   Greater trochanteric bursitis of left hip 10/18/2020   Headache 06/07/2020   Acute respiratory  failure with hypoxia (HCC) 05/02/2020   Post-COVID chronic dyspnea 05/02/2020   Pulsatile neck mass 04/26/2020   Pneumonia due to COVID-19 virus 03/12/2020   Headache disorder 01/04/2020   Seasonal allergic conjunctivitis 10/04/2019   Dysfunction of right eustachian tube 10/04/2019   Pre-diabetes 06/02/2019   Seasonal and perennial allergic rhinitis 12/22/2018   Heart palpitations 12/22/2018   History of chest pain 07/20/2018   Brain aneurysm 07/20/2018   Dyslipidemia 05/04/2018   Tendonitis, Achilles, right 12/08/2017   Unilateral primary osteoarthritis, left knee 10/07/2017   Ingrown toenail 08/10/2017   Coughing 04/24/2017   CAD (coronary artery disease) 03/01/2015   Aneurysm (HCC) 03/01/2015   Dizziness 03/01/2015   Hypokalemia 03/01/2015   Paresthesia of arm 03/01/2015   Achalasia 09/28/2012   Constipation 09/28/2012   Cephalalgia 08/24/2012   Essential hypertension 04/19/2011   Hyperlipidemia 04/19/2011   GERD (gastroesophageal reflux disease) 04/19/2011   Chest pain 04/04/2011    Past Medical History:  Diagnosis Date   Achalasia 09/28/2012   Allergic rhinitis 03/01/2015   Anemia    Aneurysm (HCC) 03/01/2015   stable 5 mm periopthalmic right ICA aneurysm 03/25/21 CTA   BP (high blood pressure) 03/01/2015   Bronchitis 04/23/2018   Cephalalgia 08/24/2012   Overview:  ICD-10 cut over     Chest pain 04/04/2011   Coronary Ca Score 0, no significant CAD 08/20/18; CP with troponin 4>32 s/p LHC showing normal coronaries 12/29/18   CN (constipation) 09/28/2012   Coughing 04/24/2017   Decreased potassium in the blood 03/01/2015  Diverticulosis    Dizziness 03/01/2015   Erythropoietin deficiency anemia 03/13/2021   GERD (gastroesophageal reflux disease)    Hiatal hernia    Hypercholesterolemia 03/01/2015   Hyperlipidemia    Hypertension    Ingrown toenail 08/10/2017   Inguinal hernia    Iron deficiency anemia 03/13/2021   Migraine headache    Onychomycosis  12/08/2017   Paresthesia of arm 03/01/2015   PE (pulmonary thromboembolism) (HCC) 03/13/2020   in setting of + COVID-19, s/p Eliquis x 3 month   Schatzki's ring    Tendonitis, Achilles, right 12/08/2017   Unilateral primary osteoarthritis, left knee 10/07/2017    Past Surgical History:  Procedure Laterality Date   ABDOMINAL HYSTERECTOMY     BACK SURGERY     x 3   CARDIAC CATHETERIZATION  12/29/2018   angiographically normal coronary arteries 12/29/18 ([place])   CARPAL TUNNEL RELEASE     left wrist   CERVICAL SPINE SURGERY     HEMORRHOID SURGERY     TOTAL HIP ARTHROPLASTY Left 09/04/2021   Procedure: LEFT TOTAL HIP ARTHROPLASTY ANTERIOR APPROACH;  Surgeon: Kathryne Hitch, MD;  Location: [facility] OR;  Service: Orthopedics;  Laterality: Left;    Social History   Tobacco Use   Smoking status: Former    Current packs/day: 0.00    Average packs/day: 0.5 packs/day for 20.0 years (10.0 ttl pk-yrs)    Types: Cigarettes    Start date: 02/19/1964    Quit date: 02/19/1984    Years since quitting: 39.3   Smokeless tobacco: Never  Vaping Use   Vaping status: Never Used  Substance Use Topics   Alcohol use: No   Drug use: No    Family History  Problem Relation Age of Onset   Allergic rhinitis Sister    Diabetes Sister    Hypertension Sister    Heart attack Father 18   Heart disease Father    Stomach cancer Maternal Grandmother    Heart disease Mother    Colon cancer Neg Hx    Angioedema Neg Hx    Asthma Neg Hx    Eczema Neg Hx    Urticaria Neg Hx    Immunodeficiency Neg Hx    Breast cancer Neg Hx    Migraines Neg Hx    Headache Neg Hx     Allergies  Allergen Reactions   Methylprednisolone Other (See Comments)    Lightheaded and "Hallucinations"   Shellfish Allergy Hives and Swelling   Ace Inhibitors Other (See Comments)    Pt cannot recall her reaction but tolerates arb    Gabapentin Other (See Comments)    headaches   Pitavastatin     Unknown  reaction  Other Reaction(s): Other (See Comments)  ADVERSE REACTION,  Pt unaware of this reaction; states she has been on crestor in the past with no issues   Sulfa Antibiotics Other (See Comments) and Rash    Fine bumps   Topiramate Palpitations    Heart race,     Medication list has been reviewed and updated.  Current Outpatient Medications on File Prior to Visit  Medication Sig Dispense Refill   albuterol (VENTOLIN HFA) 108 (90 Base) MCG/ACT inhaler Inhale 2 puffs into the lungs every 6 (six) hours as needed. 18 g 0   apixaban (ELIQUIS) 5 MG TABS tablet Take 1 tablet (5 mg total) by mouth 2 (two) times daily. 60 tablet 0   Calcium Carb-Cholecalciferol (CALCIUM 600 + D PO) Take 1 tablet by mouth daily.  carboxymethylcellulose (REFRESH PLUS) 0.5 % SOLN Place 1 drop into both eyes 3 (three) times daily as needed (dry eyes).     carvedilol (COREG) 6.25 MG tablet Take 1 tablet (6.25 mg total) by mouth 2 (two) times daily with a meal. 180 tablet 1   Cyanocobalamin (B-12 PO) Take 2 tablets by mouth daily.     ELDERBERRY PO Take 1 capsule by mouth daily.     EPINEPHrine (EPIPEN 2-PAK) 0.3 mg/0.3 mL IJ SOAJ injection Inject 0.3 mg into the muscle as needed for anaphylaxis. Per allergen immunotherapy protocol 1 each 0   fluticasone (FLONASE) 50 MCG/ACT nasal spray Place 2 sprays into both nostrils daily as needed for allergies or rhinitis. 48 g 1   losartan-hydrochlorothiazide (HYZAAR) 50-12.5 MG tablet Take 0.5 tablets by mouth daily. 45 tablet 3   Multiple Vitamins-Minerals (ALIVE MULTI-VITAMIN PO) Take 1 tablet by mouth daily.     NON FORMULARY Pt uses a cpap nightly     nortriptyline (PAMELOR) 25 MG capsule Take 2 capsules (50 mg total) by mouth at bedtime. 180 capsule 1   omeprazole (PRILOSEC) 40 MG capsule Take 1 capsule (40 mg total) by mouth daily. 90 capsule 3   pneumococcal 20-valent conjugate vaccine (PREVNAR 20) 0.5 ML injection Inject into the muscle. 0.5 mL 0   polyethylene  glycol (MIRALAX) 17 g packet Take 17 g by mouth daily as needed for mild constipation or moderate constipation. 90 packet 2   potassium chloride SA (KLOR-CON M20) 20 MEQ tablet Take 2 tablets (40 mEq total) by mouth daily. 180 tablet 1   pregabalin (LYRICA) 75 MG capsule TAKE 1 CAPSULE BY MOUTH TWICE A DAY 60 capsule 0   rosuvastatin (CRESTOR) 20 MG tablet Take 1 tablet (20 mg total) by mouth daily. 90 tablet 3   spironolactone (ALDACTONE) 25 MG tablet Take 0.5 tablets (12.5 mg total) by mouth daily. 45 tablet 3   traMADol (ULTRAM) 50 MG tablet Take 1 tablet (50 mg total) by mouth every 8 (eight) hours as needed. 60 tablet 0   No current facility-administered medications on file prior to visit.    Review of Systems:  As per HPI- otherwise negative.   Physical Examination: There were no vitals filed for this visit. There were no vitals filed for this visit. There is no height or weight on file to calculate BMI. Ideal Body Weight:    GEN: no acute distress. HEENT: Atraumatic, Normocephalic.  Ears and Nose: No external deformity. CV: RRR, No M/G/R. No JVD. No thrill. No extra heart sounds. PULM: CTA B, no wheezes, crackles, rhonchi. No retractions. No resp. distress. No accessory muscle use. ABD: S, NT, ND, +BS. No rebound. No HSM. EXTR: No c/c/e PSYCH: Normally interactive. Conversant.    Assessment and Plan: ***  Signed Gates Kasal, MD

## 2023-06-05 ENCOUNTER — Other Ambulatory Visit (HOSPITAL_BASED_OUTPATIENT_CLINIC_OR_DEPARTMENT_OTHER): Payer: Self-pay

## 2023-06-05 ENCOUNTER — Ambulatory Visit (INDEPENDENT_AMBULATORY_CARE_PROVIDER_SITE_OTHER): Admitting: Family Medicine

## 2023-06-05 VITALS — BP 132/74 | HR 88 | Temp 97.8°F | Resp 18 | Ht 64.0 in | Wt 229.4 lb

## 2023-06-05 DIAGNOSIS — M170 Bilateral primary osteoarthritis of knee: Secondary | ICD-10-CM | POA: Diagnosis not present

## 2023-06-05 DIAGNOSIS — K1379 Other lesions of oral mucosa: Secondary | ICD-10-CM | POA: Diagnosis not present

## 2023-06-09 ENCOUNTER — Ambulatory Visit (INDEPENDENT_AMBULATORY_CARE_PROVIDER_SITE_OTHER)

## 2023-06-09 DIAGNOSIS — J309 Allergic rhinitis, unspecified: Secondary | ICD-10-CM | POA: Diagnosis not present

## 2023-06-16 ENCOUNTER — Encounter: Payer: Self-pay | Admitting: Hematology & Oncology

## 2023-06-16 ENCOUNTER — Other Ambulatory Visit: Payer: Self-pay

## 2023-06-16 ENCOUNTER — Ambulatory Visit (INDEPENDENT_AMBULATORY_CARE_PROVIDER_SITE_OTHER): Admitting: Family

## 2023-06-16 ENCOUNTER — Ambulatory Visit (INDEPENDENT_AMBULATORY_CARE_PROVIDER_SITE_OTHER)

## 2023-06-16 ENCOUNTER — Ambulatory Visit: Admission: EM | Admit: 2023-06-16 | Discharge: 2023-06-16 | Disposition: A | Discharge status: Home / Self Care

## 2023-06-16 ENCOUNTER — Encounter: Payer: Self-pay | Admitting: Family

## 2023-06-16 VITALS — BP 118/78 | HR 96 | Temp 97.5°F | Resp 18 | Wt 228.9 lb

## 2023-06-16 DIAGNOSIS — J3089 Other allergic rhinitis: Secondary | ICD-10-CM | POA: Diagnosis not present

## 2023-06-16 DIAGNOSIS — R051 Acute cough: Secondary | ICD-10-CM

## 2023-06-16 DIAGNOSIS — H1013 Acute atopic conjunctivitis, bilateral: Secondary | ICD-10-CM | POA: Diagnosis not present

## 2023-06-16 DIAGNOSIS — J189 Pneumonia, unspecified organism: Secondary | ICD-10-CM | POA: Diagnosis not present

## 2023-06-16 DIAGNOSIS — R531 Weakness: Secondary | ICD-10-CM

## 2023-06-16 DIAGNOSIS — R0602 Shortness of breath: Secondary | ICD-10-CM | POA: Diagnosis not present

## 2023-06-16 DIAGNOSIS — H1010 Acute atopic conjunctivitis, unspecified eye: Secondary | ICD-10-CM

## 2023-06-16 DIAGNOSIS — J302 Other seasonal allergic rhinitis: Secondary | ICD-10-CM | POA: Diagnosis not present

## 2023-06-16 DIAGNOSIS — R918 Other nonspecific abnormal finding of lung field: Secondary | ICD-10-CM | POA: Diagnosis not present

## 2023-06-16 DIAGNOSIS — R062 Wheezing: Secondary | ICD-10-CM

## 2023-06-16 LAB — POC SARS CORONAVIRUS 2 AG -  ED: SARS Coronavirus 2 Ag: NEGATIVE

## 2023-06-16 MED ORDER — DOXYCYCLINE HYCLATE 100 MG PO CAPS: 100.0000 mg | ORAL_CAPSULE | Freq: Two times a day (BID) | ORAL | 0 refills | Status: AC

## 2023-06-16 MED ORDER — LEVALBUTEROL TARTRATE 45 MCG/ACT IN AERO
4.0000 | INHALATION_SPRAY | Freq: Once | RESPIRATORY_TRACT | Status: AC
  Administered 2023-06-16: 4 via RESPIRATORY_TRACT

## 2023-06-16 NOTE — Progress Notes (Signed)
 [address] [place] [address] Dept: [phone]  FOLLOW UP NOTE  Patient ID: Stephanie Parks, female    DOB: [birth year]  Age: 78 y.o. MRN: [id number] Date of Office Visit: 06/16/2023  Assessment  Chief Complaint: Follow-up (Same day add in, had allergy shots last Monday states a few days later started coughing and felt throat irritation,. She has not taken any antihistamines only cough syrup.)  HPI Stephanie Parks is a 78 year old female who presents today for an acute visit.  She was last seen on July 31, 2022 by [facility]. She reports last Monday she received her allergy injection and then on Wednesday her throat started irritating her.  Her throat then got worse on Thursday.  Friday she went and got some over-the-counter Delsym and that put her to sleep.  She reports weakness, coughing bad, shortness of breath, and wheezing. She denies a history asthma. She denies fevers, chest pain, dizziness, lightheaded, and lower extremity swelling. She reports that her ankles are always swollen like today.  She takes Eliquis  twice a day.She has a history of blood clots. She reports the other day she had chills, but she is not sure if she had a fever.  She denies rhinorrhea and nasal congestion.  She says it is possible she could have postnasal drip.  Her throat feels irritated and that she feels something there.  She did see ENT on May 12, 2023 for the sensation.  She mentions that she has been on allergy injections for over 10 years.  A month ago she reports that she had an allergy injection and 2 days later felt weak.  Prior to that she reports that she never had problems with her allergy injections.  She uses Flonase  nasal spray daily and does not take any over-the-counter histamine.  Allergic conjunctivitis: She reports itchy eyes for which she uses refresh eyedrops as per her eye doctor.  She continues to take omeprazole  daily   Drug Allergies:  Allergies  Allergen Reactions    Methylprednisolone  Other (See Comments)    Lightheaded and "Hallucinations"   Shellfish Allergy Hives and Swelling   Ace Inhibitors Other (See Comments)    Pt cannot recall her reaction but tolerates arb    Gabapentin  Other (See Comments)    headaches   Pitavastatin     Unknown reaction  Other Reaction(s): Other (See Comments)  ADVERSE REACTION,  Pt unaware of this reaction; states she has been on crestor  in the past with no issues   Sulfa Antibiotics Other (See Comments) and Rash    Fine bumps   Topiramate  Palpitations    Heart race,     Review of Systems: Review of Systems  Constitutional:  Positive for chills. Negative for fever.       Reports weakness  HENT:         Reports possible post nasal drip and denies rhinorrhea and nasal congestion  Eyes:        Reports itchy eyes  Respiratory:  Positive for cough, shortness of breath and wheezing.   Cardiovascular:  Negative for chest pain and leg swelling.  Skin:  Negative for rash.  Neurological:  Negative for dizziness.  Endo/Heme/Allergies:  Positive for environmental allergies.      Physical Exam: BP 118/78   Pulse 96   Temp (!) 97.5 F (36.4 C) (Temporal)   Resp 18   Wt 228 lb 14.4 oz (103.8 kg)   SpO2 98%   BMI 39.29 kg/m  Physical Exam Constitutional:      Appearance: Normal appearance.  HENT:     Head: Normocephalic and atraumatic.     Comments: Pharynx normal. Eyes normal. Ears normal. Nose: bilateral lower turbinates moderately edematous and slightly erythematous with no drainage noted.    Right Ear: Tympanic membrane, ear canal and external ear normal.     Left Ear: Tympanic membrane, ear canal and external ear normal.     Mouth/Throat:     Mouth: Mucous membranes are moist.     Pharynx: Oropharynx is clear.  Eyes:     Conjunctiva/sclera: Conjunctivae normal.  Cardiovascular:     Rate and Rhythm: Regular rhythm.     Heart sounds: Normal heart sounds.  Pulmonary:     Effort: Pulmonary effort  is normal.     Breath sounds: Normal breath sounds.     Comments: Lungs clear to auscultation Musculoskeletal:     Cervical back: Neck supple.  Skin:    General: Skin is warm.  Neurological:     Mental Status: She is alert and oriented to person, place, and time.  Psychiatric:        Mood and Affect: Mood normal.        Behavior: Behavior normal.        Thought Content: Thought content normal.        Judgment: Judgment normal.     Diagnostics: FVC 1.54 L (69%), FEV1 1.15 L (66%), FEV1/FVC 0.75.  Spirometry indicates normal spirometry-reduced FEV1. 4 puffs of Xopenex given. Post bronchodilator response shows FVC 1.39 L (63%), FEV1 1.20 L (69%, FEV1/FVC 0.86.  There is only a 4.35% change in FEV1.  Spirometry indicates no significant response.  Normal spirometry-reduced FVC.  Assessment and Plan: 1. Shortness of breath   2. Acute cough   3. Wheeze   4. Weakness   5. Seasonal and perennial allergic rhinitis   6. Seasonal allergic conjunctivitis     Meds ordered this encounter  Medications   levalbuterol (XOPENEX HFA) inhaler 4 puff    Patient Instructions  Allergic rhinitis Discussed how I did not feel like her symptoms were an allergic reaction due to her allergy injection on Monday and her symptoms starting Wednesday Continue to use flonase  1-2 sprays as needed, best when used consistently Can add non-sedating antihstamine if needed-Your options include: Zyrtec (cetirizine) 10 mg, Claritin (loratadine) 10 mg, Xyzal  (levocetirizine) 5 mg or Allegra (fexofenadine) 180 mg daily as needed Continue allergen avoidance measures directed toward dust mite, cockroach, and weed pollen as listed below Consider stopping allergen immunotherapy and have access to an epinephrine  autoinjector set.  If interested we could schedule an appointment for skin testing to environmental allergens  Allergic conjunctivitis Continue Refresh eyedrops as per your eye doctor  Trouble swallowing Resolved   Continue Omeprazole  40mg  daily    PE  - Continue all medications as prescribed by cardiology and follow up with cardiology and pulmonary as planned.   Due to your symptoms I recommend that you go to urgent care where you can get lab work, EKG and possible imaging Follow up: 2 months     Return in about 2 months (around 08/16/2023).    Thank you for the opportunity to care for this patient.  Please do not hesitate to contact me with questions.  Tinnie Forehand, FNP [facility] 

## 2023-06-16 NOTE — ED Provider Notes (Signed)
 [facility]    CSN: [id number] Arrival date & time: 06/16/23  1305      History   Chief Complaint No chief complaint on file.   HPI Stephanie Parks is a 78 y.o. female.   Patient with history of HTN, HLD, hx of PE (on eliquis ), here concerned with SOB x several days.  Started with scratchy throat and a cough.  She had some wheezing last night.  She reports she had some central chest pressure, but that resolved.  She went to her allergists office today thinking it was a reaction to an allergy injection she received last Monday.  They attempted a nebulizer treatment but she says she did not complete it due to persistent and excessive coughing.  She denies fever, chills, weight gain, n/v/d, LE swelling, current chest pain, palpitations.      Past Medical History:  Diagnosis Date   Achalasia 09/28/2012   Allergic rhinitis 03/01/2015   Anemia    Aneurysm (HCC) 03/01/2015   stable 5 mm periopthalmic right ICA aneurysm 03/25/21 CTA   BP (high blood pressure) 03/01/2015   Bronchitis 04/23/2018   Cephalalgia 08/24/2012   Overview:  ICD-10 cut over     Chest pain 04/04/2011   Coronary Ca Score 0, no significant CAD 08/20/18; CP with troponin 4>32 s/p LHC showing normal coronaries 12/29/18   CN (constipation) 09/28/2012   Coughing 04/24/2017   Decreased potassium in the blood 03/01/2015   Diverticulosis    Dizziness 03/01/2015   Erythropoietin  deficiency anemia 03/13/2021   GERD (gastroesophageal reflux disease)    Hiatal hernia    Hypercholesterolemia 03/01/2015   Hyperlipidemia    Hypertension    Ingrown toenail 08/10/2017   Inguinal hernia    Iron deficiency anemia 03/13/2021   Migraine headache    Onychomycosis 12/08/2017   Paresthesia of arm 03/01/2015   PE (pulmonary thromboembolism) (HCC) 03/13/2020   in setting of + COVID-19, s/p Eliquis  x 3 month   Schatzki's ring    Tendonitis, Achilles, right 12/08/2017   Unilateral primary osteoarthritis, left knee  10/07/2017    Patient Active Problem List   Diagnosis Date Noted   Multiple subsegmental pulmonary emboli without acute cor pulmonale (HCC) 07/23/2022   TIA (transient ischemic attack) 06/08/2022   Diastolic dysfunction 06/08/2022   Obesity (BMI 30-39.9) 06/08/2022   OSA (obstructive sleep apnea) 06/08/2022   Unilateral primary osteoarthritis, right hip 05/29/2022   Status post total replacement of left hip 09/04/2021   DJD (degenerative joint disease) 09/04/2021   Iron deficiency anemia 03/13/2021   Erythropoietin  deficiency anemia 03/13/2021   Unilateral primary osteoarthritis, left hip 10/18/2020   Greater trochanteric bursitis of left hip 10/18/2020   Headache 06/07/2020   Acute respiratory failure with hypoxia (HCC) 05/02/2020   Post-COVID chronic dyspnea 05/02/2020   Pulsatile neck mass 04/26/2020   Pneumonia due to COVID-19 virus 03/12/2020   Headache disorder 01/04/2020   Seasonal allergic conjunctivitis 10/04/2019   Dysfunction of right eustachian tube 10/04/2019   Pre-diabetes 06/02/2019   Seasonal and perennial allergic rhinitis 12/22/2018   Heart palpitations 12/22/2018   History of chest pain 07/20/2018   Brain aneurysm 07/20/2018   Dyslipidemia 05/04/2018   Tendonitis, Achilles, right 12/08/2017   Unilateral primary osteoarthritis, left knee 10/07/2017   Ingrown toenail 08/10/2017   Coughing 04/24/2017   CAD (coronary artery disease) 03/01/2015   Aneurysm (HCC) 03/01/2015   Dizziness 03/01/2015   Hypokalemia 03/01/2015   Paresthesia of arm 03/01/2015   Achalasia 09/28/2012  Constipation 09/28/2012   Cephalalgia 08/24/2012   Essential hypertension 04/19/2011   Hyperlipidemia 04/19/2011   GERD (gastroesophageal reflux disease) 04/19/2011   Chest pain 04/04/2011    Past Surgical History:  Procedure Laterality Date   ABDOMINAL HYSTERECTOMY     BACK SURGERY     x 3   CARDIAC CATHETERIZATION  12/29/2018   angiographically normal coronary arteries  12/29/18 ([place])   CARPAL TUNNEL RELEASE     left wrist   CERVICAL SPINE SURGERY     HEMORRHOID SURGERY     TOTAL HIP ARTHROPLASTY Left 09/04/2021   Procedure: LEFT TOTAL HIP ARTHROPLASTY ANTERIOR APPROACH;  Surgeon: Arnie Lao, MD;  Location: [facility] OR;  Service: Orthopedics;  Laterality: Left;    OB History   No obstetric history on file.      Home Medications    Prior to Admission medications   Medication Sig Start Date End Date Taking? Authorizing Provider  linaclotide  (LINZESS ) 145 MCG CAPS capsule Take 145 mcg by mouth. 05/22/23  Yes [provider]  apixaban  (ELIQUIS ) 5 MG TABS tablet Take 1 tablet (5 mg total) by mouth 2 (two) times daily. 12/31/22   Saguier, Gaylin Ke, PA-C  Calcium  Carb-Cholecalciferol (CALCIUM  600 + D PO) Take 1 tablet by mouth daily.    [provider]  carboxymethylcellulose (REFRESH PLUS) 0.5 % SOLN Place 1 drop into both eyes 3 (three) times daily as needed (dry eyes).    [provider]  carvedilol  (COREG ) 6.25 MG tablet Take 1 tablet (6.25 mg total) by mouth 2 (two) times daily with a meal. 01/15/23   Copland, Skipper Dumas, MD  Cyanocobalamin  (B-12 PO) Take 2 tablets by mouth daily.    [provider]  ELDERBERRY PO Take 1 capsule by mouth daily.    [provider]  EPINEPHrine  (EPIPEN  2-PAK) 0.3 mg/0.3 mL IJ SOAJ injection Inject 0.3 mg into the muscle as needed for anaphylaxis. Per allergen immunotherapy protocol 03/17/23   Orelia Binet, MD  fluticasone  (FLONASE ) 50 MCG/ACT nasal spray Place 2 sprays into both nostrils daily as needed for allergies or rhinitis. 05/15/23   Copland, Skipper Dumas, MD  losartan -hydrochlorothiazide  (HYZAAR ) 50-12.5 MG tablet Take 0.5 tablets by mouth daily. 03/13/23   Copland, Jessica C, MD  Multiple Vitamins-Minerals (ALIVE MULTI-VITAMIN PO) Take 1 tablet by mouth daily.    [provider]  NON FORMULARY Pt uses a cpap nightly    [provider]   nortriptyline  (PAMELOR ) 25 MG capsule Take 2 capsules (50 mg total) by mouth at bedtime. 10/24/22   Copland, Skipper Dumas, MD  omeprazole  (PRILOSEC) 40 MG capsule Take 1 capsule (40 mg total) by mouth daily. 08/19/22   Copland, Jessica C, MD  polyethylene glycol (MIRALAX ) 17 g packet Take 17 g by mouth daily as needed for mild constipation or moderate constipation. 07/01/22   Copland, Skipper Dumas, MD  potassium chloride  SA (KLOR-CON  M20) 20 MEQ tablet Take 2 tablets (40 mEq total) by mouth daily. 01/15/23   Copland, Skipper Dumas, MD  pregabalin  (LYRICA ) 75 MG capsule TAKE 1 CAPSULE BY MOUTH TWICE A DAY 04/01/23   Arnie Lao, MD  rosuvastatin  (CRESTOR ) 20 MG tablet Take 1 tablet (20 mg total) by mouth daily. 04/03/23   Copland, Skipper Dumas, MD  spironolactone  (ALDACTONE ) 25 MG tablet Take 0.5 tablets (12.5 mg total) by mouth daily. 08/19/22   Copland, Skipper Dumas, MD  traMADol  (ULTRAM ) 50 MG tablet Take 1 tablet (50 mg total) by mouth every 8 (eight)  hours as needed. 09/26/22   Copland, Skipper Dumas, MD    Family History Family History  Problem Relation Age of Onset   Allergic rhinitis Sister    Diabetes Sister    Hypertension Sister    Heart attack Father 41   Heart disease Father    Stomach cancer Maternal Grandmother    Heart disease Mother    Colon cancer Neg Hx    Angioedema Neg Hx    Asthma Neg Hx    Eczema Neg Hx    Urticaria Neg Hx    Immunodeficiency Neg Hx    Breast cancer Neg Hx    Migraines Neg Hx    Headache Neg Hx     Social History Social History   Tobacco Use   Smoking status: Former    Current packs/day: 0.00    Average packs/day: 0.5 packs/day for 20.0 years (10.0 ttl pk-yrs)    Types: Cigarettes    Start date: 02/19/1964    Quit date: 02/19/1984    Years since quitting: 39.3   Smokeless tobacco: Never  Vaping Use   Vaping status: Never Used  Substance Use Topics   Alcohol  use: No   Drug use: No     Allergies   Methylprednisolone , Shellfish allergy, Ace  inhibitors, Gabapentin , Pitavastatin, Sulfa antibiotics, and Topiramate    Review of Systems Review of Systems  Constitutional:  Negative for chills, fatigue and fever.  HENT:  Positive for congestion and sore throat. Negative for ear pain, nosebleeds, postnasal drip, rhinorrhea, sinus pressure and sinus pain.   Eyes:  Negative for pain and redness.  Respiratory:  Positive for cough, shortness of breath and wheezing.   Cardiovascular:  Negative for chest pain, palpitations and leg swelling.  Gastrointestinal:  Negative for abdominal pain, diarrhea, nausea and vomiting.  Musculoskeletal:  Negative for arthralgias, gait problem and myalgias.  Skin:  Negative for rash.  Neurological:  Negative for light-headedness and headaches.  Hematological:  Negative for adenopathy. Does not bruise/bleed easily.  Psychiatric/Behavioral:  Negative for confusion and sleep disturbance.      Physical Exam Triage Vital Signs ED Triage Vitals  Encounter Vitals Group     BP 06/16/23 1317 126/76     Systolic BP Percentile --      Diastolic BP Percentile --      Pulse Rate 06/16/23 1317 87     Resp 06/16/23 1317 18     Temp 06/16/23 1317 98.9 F (37.2 C)     Temp Source 06/16/23 1317 Oral     SpO2 06/16/23 1317 96 %     Weight --      Height --      Head Circumference --      Peak Flow --      Pain Score 06/16/23 1314 0     Pain Loc --      Pain Education --      Exclude from Growth Chart --    No data found.  Updated Vital Signs BP 126/76   Pulse 87   Temp 98.9 F (37.2 C) (Oral)   Resp 18   SpO2 96%   Visual Acuity Right Eye Distance:   Left Eye Distance:   Bilateral Distance:    Right Eye Near:   Left Eye Near:    Bilateral Near:     Physical Exam Vitals and nursing note reviewed.  Constitutional:      General: She is not in acute distress.    Appearance: Normal appearance. She  is not ill-appearing.  HENT:     Head: Normocephalic and atraumatic.     Right Ear: Tympanic  membrane and ear canal normal.     Left Ear: Tympanic membrane and ear canal normal.     Nose: Rhinorrhea present. No congestion.     Mouth/Throat:     Pharynx: No oropharyngeal exudate or posterior oropharyngeal erythema.  Eyes:     General: No scleral icterus.    Extraocular Movements: Extraocular movements intact.     Conjunctiva/sclera: Conjunctivae normal.  Cardiovascular:     Rate and Rhythm: Normal rate and regular rhythm.     Heart sounds: No murmur heard. Pulmonary:     Effort: Pulmonary effort is normal. No respiratory distress.     Breath sounds: Normal breath sounds. No stridor. No wheezing, rhonchi or rales.  Musculoskeletal:     Cervical back: Normal range of motion. No rigidity.  Lymphadenopathy:     Cervical: No cervical adenopathy.  Skin:    Coloration: Skin is not jaundiced.     Findings: No rash.  Neurological:     General: No focal deficit present.     Mental Status: She is alert and oriented to person, place, and time.     Motor: No weakness.     Gait: Gait normal.  Psychiatric:        Mood and Affect: Mood normal.        Behavior: Behavior normal.      UC Treatments / Results  Labs (all labs ordered are listed, but only abnormal results are displayed) Labs Reviewed  POC SARS CORONAVIRUS 2 AG -  ED    EKG Orders placed or performed during the hospital encounter of 06/16/23   ED EKG   ED EKG   *Note: Due to a large number of results and/or encounters for the requested time period, some results have not been displayed. A complete set of results can be found in Results Review.  EKG: normal EKG, normal sinus rhythm, unchanged from previous tracings.  Radiology DG Chest 2 View Result Date: 06/16/2023 CLINICAL DATA:  Dyspnea.  Shortness of breath EXAM: CHEST - 2 VIEW COMPARISON:  X-ray 03/13/2023 and older FINDINGS: Normal cardiopericardial silhouette. No pneumothorax or effusion. Interstitial changes are again seen. There is slightly more ill-defined  opacity in the right mid upper lung. Acute infiltrate is possible. Recommend follow-up. Degenerative changes of the spine. Fixation hardware along the lower cervical spine. IMPRESSION: Slight increase in ill-defined opacity right upper lung. An acute infiltrate is possible. Recommend follow-up. Please correlate with symptoms Electronically Signed   By: Adrianna Horde M.D.   On: 06/16/2023 13:52    Procedures Procedures (including critical care time)  Medications Ordered in UC Medications - No data to display  Initial Impression / Assessment and Plan / UC Course  I have reviewed the triage vital signs and the nursing notes.  Pertinent labs & imaging results that were available during my care of the patient were reviewed by me and considered in my medical decision making (see chart for details).     CXR with possible RUL infiltrate ECG with any acute abnormalities Patient in NAD I will treat for pneumonia Strict ED precautions provided Patient does have history of PE, however, she is taking her eliquis   She does not have any signs of volume overload to suggest CHF Discussed differential with patient, she is in agreement with plan  Final Clinical Impressions(s) / UC Diagnoses   Final diagnoses:  Acute cough  SOB (shortness of breath)   Discharge Instructions   None    ED Prescriptions   None    PDMP not reviewed this encounter.   Lavonia Powers, PA-C 06/16/23 1411

## 2023-06-16 NOTE — ED Triage Notes (Signed)
 Pt c/o SOB and wheezingx3d. Pt went to allergy clinic today for the SOB and wheezing thinking it was caused by her allergy injections she got a week ago there and they gave her a nebulized tx and told her they didn't think it was coming from that. Pt c/o non radiating midsternal chest pressure. Pt seemed a little SOB when walking to exam room.

## 2023-06-16 NOTE — Patient Instructions (Addendum)
 Allergic rhinitis Discussed how I did not feel like her symptoms were an allergic reaction due to her allergy injection on Monday and her symptoms starting Wednesday Continue to use flonase  1-2 sprays as needed, best when used consistently Can add non-sedating antihstamine if needed-Your options include: Zyrtec (cetirizine) 10 mg, Claritin (loratadine) 10 mg, Xyzal  (levocetirizine) 5 mg or Allegra (fexofenadine) 180 mg daily as needed Continue allergen avoidance measures directed toward dust mite, cockroach, and weed pollen as listed below Consider stopping allergen immunotherapy and have access to an epinephrine  autoinjector set.  If interested we could schedule an appointment for skin testing to environmental allergens  Allergic conjunctivitis Continue Refresh eyedrops as per your eye doctor  Trouble swallowing Resolved  Continue Omeprazole  40mg  daily    PE  - Continue all medications as prescribed by cardiology and follow up with cardiology and pulmonary as planned.   Due to your symptoms I recommend that you go to urgent care where you can get lab work, EKG and possible imaging Follow up: 2 months

## 2023-06-16 NOTE — Discharge Instructions (Addendum)
 Go to ED if symptoms worsen or fail to improve in the next 1 - 2 days Follow up with PCP

## 2023-06-22 ENCOUNTER — Encounter (HOSPITAL_BASED_OUTPATIENT_CLINIC_OR_DEPARTMENT_OTHER): Payer: Self-pay | Admitting: *Deleted

## 2023-06-22 ENCOUNTER — Emergency Department (HOSPITAL_BASED_OUTPATIENT_CLINIC_OR_DEPARTMENT_OTHER)

## 2023-06-22 ENCOUNTER — Ambulatory Visit
Admission: EM | Admit: 2023-06-22 | Discharge: 2023-06-22 | Disposition: A | Discharge status: Home / Self Care | Attending: Family Medicine | Admitting: Family Medicine

## 2023-06-22 ENCOUNTER — Other Ambulatory Visit: Payer: Self-pay

## 2023-06-22 ENCOUNTER — Emergency Department (HOSPITAL_BASED_OUTPATIENT_CLINIC_OR_DEPARTMENT_OTHER)
Admission: EM | Admit: 2023-06-22 | Discharge: 2023-06-22 | Disposition: A | Discharge status: Home / Self Care | Attending: Emergency Medicine | Admitting: Emergency Medicine

## 2023-06-22 DIAGNOSIS — I10 Essential (primary) hypertension: Secondary | ICD-10-CM | POA: Diagnosis not present

## 2023-06-22 DIAGNOSIS — Z8673 Personal history of transient ischemic attack (TIA), and cerebral infarction without residual deficits: Secondary | ICD-10-CM | POA: Diagnosis not present

## 2023-06-22 DIAGNOSIS — E876 Hypokalemia: Secondary | ICD-10-CM

## 2023-06-22 DIAGNOSIS — J168 Pneumonia due to other specified infectious organisms: Secondary | ICD-10-CM | POA: Diagnosis not present

## 2023-06-22 DIAGNOSIS — Z7901 Long term (current) use of anticoagulants: Secondary | ICD-10-CM | POA: Insufficient documentation

## 2023-06-22 DIAGNOSIS — J189 Pneumonia, unspecified organism: Secondary | ICD-10-CM | POA: Diagnosis not present

## 2023-06-22 DIAGNOSIS — I2510 Atherosclerotic heart disease of native coronary artery without angina pectoris: Secondary | ICD-10-CM | POA: Insufficient documentation

## 2023-06-22 DIAGNOSIS — R0602 Shortness of breath: Secondary | ICD-10-CM

## 2023-06-22 DIAGNOSIS — R0600 Dyspnea, unspecified: Secondary | ICD-10-CM

## 2023-06-22 DIAGNOSIS — R059 Cough, unspecified: Secondary | ICD-10-CM

## 2023-06-22 DIAGNOSIS — Z79899 Other long term (current) drug therapy: Secondary | ICD-10-CM | POA: Insufficient documentation

## 2023-06-22 DIAGNOSIS — R079 Chest pain, unspecified: Secondary | ICD-10-CM | POA: Diagnosis not present

## 2023-06-22 DIAGNOSIS — Z86711 Personal history of pulmonary embolism: Secondary | ICD-10-CM | POA: Diagnosis not present

## 2023-06-22 LAB — BASIC METABOLIC PANEL WITH GFR
Anion gap: 16 — ABNORMAL HIGH (ref 5–15)
BUN: 16 mg/dL (ref 8–23)
CO2: 24 mmol/L (ref 22–32)
Calcium: 9.5 mg/dL (ref 8.9–10.3)
Chloride: 103 mmol/L (ref 98–111)
Creatinine, Ser: 0.89 mg/dL (ref 0.44–1.00)
GFR, Estimated: >60 mL/min (ref >60)
Glucose, Bld: 92 mg/dL (ref 70–99)
Potassium: 2.9 mmol/L — ABNORMAL LOW (ref 3.5–5.1)
Sodium: 143 mmol/L (ref 135–145)

## 2023-06-22 LAB — TROPONIN T, HIGH SENSITIVITY
Troponin T High Sensitivity: <15 ng/L (ref <19)
Troponin T High Sensitivity: <15 ng/L (ref <19)

## 2023-06-22 LAB — CBC
HCT: 33.4 % — ABNORMAL LOW (ref 36.0–46.0)
Hemoglobin: 10.8 g/dL — ABNORMAL LOW (ref 12.0–15.0)
MCH: 28.3 pg (ref 26.0–34.0)
MCHC: 32.3 g/dL (ref 30.0–36.0)
MCV: 87.7 fL (ref 80.0–100.0)
Platelets: 336 10*3/uL (ref 150–400)
RBC: 3.81 MIL/uL — ABNORMAL LOW (ref 3.87–5.11)
RDW: 14.6 % (ref 11.5–15.5)
WBC: 7.4 10*3/uL (ref 4.0–10.5)
nRBC: 0 % (ref 0.0–0.2)

## 2023-06-22 LAB — MAGNESIUM: Magnesium: 1.6 mg/dL — ABNORMAL LOW (ref 1.7–2.4)

## 2023-06-22 LAB — PRO BRAIN NATRIURETIC PEPTIDE: Pro Brain Natriuretic Peptide: 104 pg/mL (ref <300.0)

## 2023-06-22 MED ORDER — ALBUTEROL SULFATE HFA 108 (90 BASE) MCG/ACT IN AERS: 1.0000 | INHALATION_SPRAY | Freq: Four times a day (QID) | RESPIRATORY_TRACT | 0 refills | Status: AC | PRN

## 2023-06-22 MED ORDER — IOHEXOL 350 MG/ML SOLN
100.0000 mL | Freq: Once | INTRAVENOUS | Status: AC | PRN
  Administered 2023-06-22: 100 mL via INTRAVENOUS

## 2023-06-22 MED ORDER — PREDNISONE 10 MG (21) PO TBPK: ORAL_TABLET | Freq: Every day | ORAL | 0 refills | Status: DC

## 2023-06-22 MED ORDER — POTASSIUM CHLORIDE CRYS ER 20 MEQ PO TBCR
40.0000 meq | EXTENDED_RELEASE_TABLET | Freq: Once | ORAL | Status: AC
  Administered 2023-06-22: 40 meq via ORAL
  Filled 2023-06-22: qty 2

## 2023-06-22 MED ORDER — ALBUTEROL SULFATE (2.5 MG/3ML) 0.083% IN NEBU
2.5000 mg | INHALATION_SOLUTION | Freq: Once | RESPIRATORY_TRACT | Status: AC
  Administered 2023-06-22: 2.5 mg via RESPIRATORY_TRACT
  Filled 2023-06-22: qty 3

## 2023-06-22 NOTE — ED Notes (Signed)
 330pm trop

## 2023-06-22 NOTE — ED Triage Notes (Signed)
 Pt states she is still having the same sx as last Monday and that she is not getting any better. Pt is a little tachypneic

## 2023-06-22 NOTE — ED Provider Notes (Signed)
 [facility]    CSN: [id number] Arrival date & time: 06/22/23  1111      History   Chief Complaint No chief complaint on file.   HPI Stephanie Parks is a 78 y.o. female presents for shortness of breath.  Patient has a past medical history of pulmonary embolus x 2 currently on Eliquis , anemia, high blood pressure, high cholesterol, migraines who presents for worsening shortness of breath.  Patient was seen in urgent care on 4/28 for cough and shortness of breath.  Chest x-ray showed possible right upper lobe opacity.  She was started on doxycycline  twice daily for 7 days for which she is almost completed.  She reports no improvement in symptoms and states her cough and her shortness of breath have worsened.  She states she normally sleeps with her head propped up but does states she has had to increase that ankle more.  Reports chronic lower extremity swelling but nothing outside of her normal.  No chest pain, fevers, vomiting.  She is concerned she may have another blood clot.  She states she did not have any symptoms and did not know she had a pulmonary embolus.  She is concerned she may have more clots.  She is taking Eliquis  as prescribed.  HPI  Past Medical History:  Diagnosis Date   Achalasia 09/28/2012   Allergic rhinitis 03/01/2015   Anemia    Aneurysm (HCC) 03/01/2015   stable 5 mm periopthalmic right ICA aneurysm 03/25/21 CTA   BP (high blood pressure) 03/01/2015   Bronchitis 04/23/2018   Cephalalgia 08/24/2012   Overview:  ICD-10 cut over     Chest pain 04/04/2011   Coronary Ca Score 0, no significant CAD 08/20/18; CP with troponin 4>32 s/p LHC showing normal coronaries 12/29/18   CN (constipation) 09/28/2012   Coughing 04/24/2017   Decreased potassium in the blood 03/01/2015   Diverticulosis    Dizziness 03/01/2015   Erythropoietin  deficiency anemia 03/13/2021   GERD (gastroesophageal reflux disease)    Hiatal hernia    Hypercholesterolemia 03/01/2015    Hyperlipidemia    Hypertension    Ingrown toenail 08/10/2017   Inguinal hernia    Iron deficiency anemia 03/13/2021   Migraine headache    Onychomycosis 12/08/2017   Paresthesia of arm 03/01/2015   PE (pulmonary thromboembolism) (HCC) 03/13/2020   in setting of + COVID-19, s/p Eliquis  x 3 month   Schatzki's ring    Tendonitis, Achilles, right 12/08/2017   Unilateral primary osteoarthritis, left knee 10/07/2017    Patient Active Problem List   Diagnosis Date Noted   Multiple subsegmental pulmonary emboli without acute cor pulmonale (HCC) 07/23/2022   TIA (transient ischemic attack) 06/08/2022   Diastolic dysfunction 06/08/2022   Obesity (BMI 30-39.9) 06/08/2022   OSA (obstructive sleep apnea) 06/08/2022   Unilateral primary osteoarthritis, right hip 05/29/2022   Status post total replacement of left hip 09/04/2021   DJD (degenerative joint disease) 09/04/2021   Iron deficiency anemia 03/13/2021   Erythropoietin  deficiency anemia 03/13/2021   Unilateral primary osteoarthritis, left hip 10/18/2020   Greater trochanteric bursitis of left hip 10/18/2020   Headache 06/07/2020   Acute respiratory failure with hypoxia (HCC) 05/02/2020   Post-COVID chronic dyspnea 05/02/2020   Pulsatile neck mass 04/26/2020   Pneumonia due to COVID-19 virus 03/12/2020   Headache disorder 01/04/2020   Seasonal allergic conjunctivitis 10/04/2019   Dysfunction of right eustachian tube 10/04/2019   Pre-diabetes 06/02/2019   Seasonal and perennial allergic rhinitis 12/22/2018   Heart  palpitations 12/22/2018   History of chest pain 07/20/2018   Brain aneurysm 07/20/2018   Dyslipidemia 05/04/2018   Tendonitis, Achilles, right 12/08/2017   Unilateral primary osteoarthritis, left knee 10/07/2017   Ingrown toenail 08/10/2017   Coughing 04/24/2017   CAD (coronary artery disease) 03/01/2015   Aneurysm (HCC) 03/01/2015   Dizziness 03/01/2015   Hypokalemia 03/01/2015   Paresthesia of arm 03/01/2015    Achalasia 09/28/2012   Constipation 09/28/2012   Cephalalgia 08/24/2012   Essential hypertension 04/19/2011   Hyperlipidemia 04/19/2011   GERD (gastroesophageal reflux disease) 04/19/2011   Chest pain 04/04/2011    Past Surgical History:  Procedure Laterality Date   ABDOMINAL HYSTERECTOMY     BACK SURGERY     x 3   CARDIAC CATHETERIZATION  12/29/2018   angiographically normal coronary arteries 12/29/18 ([place])   CARPAL TUNNEL RELEASE     left wrist   CERVICAL SPINE SURGERY     HEMORRHOID SURGERY     TOTAL HIP ARTHROPLASTY Left 09/04/2021   Procedure: LEFT TOTAL HIP ARTHROPLASTY ANTERIOR APPROACH;  Surgeon: Arnie Lao, MD;  Location: [facility] OR;  Service: Orthopedics;  Laterality: Left;    OB History   No obstetric history on file.      Home Medications    Prior to Admission medications   Medication Sig Start Date End Date Taking? Authorizing Provider  apixaban  (ELIQUIS ) 5 MG TABS tablet Take 1 tablet (5 mg total) by mouth 2 (two) times daily. 12/31/22   Saguier, Gaylin Ke, PA-C  Calcium  Carb-Cholecalciferol (CALCIUM  600 + D PO) Take 1 tablet by mouth daily.    [provider]  carboxymethylcellulose (REFRESH PLUS) 0.5 % SOLN Place 1 drop into both eyes 3 (three) times daily as needed (dry eyes).    [provider]  carvedilol  (COREG ) 6.25 MG tablet Take 1 tablet (6.25 mg total) by mouth 2 (two) times daily with a meal. 01/15/23   Copland, Skipper Dumas, MD  Cyanocobalamin  (B-12 PO) Take 2 tablets by mouth daily.    [provider]  doxycycline  (VIBRAMYCIN ) 100 MG capsule Take 1 capsule (100 mg total) by mouth 2 (two) times daily for 7 days. 06/16/23 06/23/23  Lavonia Powers, PA-C  ELDERBERRY PO Take 1 capsule by mouth daily.    [provider]  EPINEPHrine  (EPIPEN  2-PAK) 0.3 mg/0.3 mL IJ SOAJ injection Inject 0.3 mg into the muscle as needed for anaphylaxis. Per allergen immunotherapy protocol 03/17/23   Orelia Binet, MD  fluticasone   (FLONASE ) 50 MCG/ACT nasal spray Place 2 sprays into both nostrils daily as needed for allergies or rhinitis. 05/15/23   Copland, Skipper Dumas, MD  linaclotide  (LINZESS ) 145 MCG CAPS capsule Take 145 mcg by mouth. 05/22/23   [provider]  losartan -hydrochlorothiazide  (HYZAAR ) 50-12.5 MG tablet Take 0.5 tablets by mouth daily. 03/13/23   Copland, Jessica C, MD  Multiple Vitamins-Minerals (ALIVE MULTI-VITAMIN PO) Take 1 tablet by mouth daily.    [provider]  NON FORMULARY Pt uses a cpap nightly    [provider]  nortriptyline  (PAMELOR ) 25 MG capsule Take 2 capsules (50 mg total) by mouth at bedtime. 10/24/22   Copland, Skipper Dumas, MD  omeprazole  (PRILOSEC) 40 MG capsule Take 1 capsule (40 mg total) by mouth daily. 08/19/22   Copland, Jessica C, MD  polyethylene glycol (MIRALAX ) 17 g packet Take 17 g by mouth daily as needed for mild constipation or moderate constipation. 07/01/22   Copland, Skipper Dumas, MD  potassium chloride  SA (KLOR-CON  M20) 20  MEQ tablet Take 2 tablets (40 mEq total) by mouth daily. 01/15/23   Copland, Skipper Dumas, MD  pregabalin  (LYRICA ) 75 MG capsule TAKE 1 CAPSULE BY MOUTH TWICE A DAY 04/01/23   Arnie Lao, MD  rosuvastatin  (CRESTOR ) 20 MG tablet Take 1 tablet (20 mg total) by mouth daily. 04/03/23   Copland, Skipper Dumas, MD  spironolactone  (ALDACTONE ) 25 MG tablet Take 0.5 tablets (12.5 mg total) by mouth daily. 08/19/22   Copland, Skipper Dumas, MD  traMADol  (ULTRAM ) 50 MG tablet Take 1 tablet (50 mg total) by mouth every 8 (eight) hours as needed. 09/26/22   Copland, Skipper Dumas, MD    Family History Family History  Problem Relation Age of Onset   Allergic rhinitis Sister    Diabetes Sister    Hypertension Sister    Heart attack Father 56   Heart disease Father    Stomach cancer Maternal Grandmother    Heart disease Mother    Colon cancer Neg Hx    Angioedema Neg Hx    Asthma Neg Hx    Eczema Neg Hx    Urticaria Neg Hx    Immunodeficiency Neg Hx     Breast cancer Neg Hx    Migraines Neg Hx    Headache Neg Hx     Social History Social History   Tobacco Use   Smoking status: Former    Current packs/day: 0.00    Average packs/day: 0.5 packs/day for 20.0 years (10.0 ttl pk-yrs)    Types: Cigarettes    Start date: 02/19/1964    Quit date: 02/19/1984    Years since quitting: 39.3   Smokeless tobacco: Never  Vaping Use   Vaping status: Never Used  Substance Use Topics   Alcohol  use: No   Drug use: No     Allergies   Methylprednisolone , Shellfish allergy, Ace inhibitors, Gabapentin , Pitavastatin, Sulfa antibiotics, and Topiramate    Review of Systems Review of Systems  HENT:  Positive for congestion.   Respiratory:  Positive for cough and shortness of breath.      Physical Exam Triage Vital Signs ED Triage Vitals  Encounter Vitals Group     BP 06/22/23 1143 124/78     Systolic BP Percentile --      Diastolic BP Percentile --      Pulse Rate 06/22/23 1143 82     Resp 06/22/23 1143 (!) 22     Temp 06/22/23 1143 98.6 F (37 C)     Temp Source 06/22/23 1143 Oral     SpO2 06/22/23 1143 94 %     Weight --      Height --      Head Circumference --      Peak Flow --      Pain Score 06/22/23 1142 0     Pain Loc --      Pain Education --      Exclude from Growth Chart --    No data found.  Updated Vital Signs BP 124/78   Pulse 82   Temp 98.6 F (37 C) (Oral)   Resp (!) 22   SpO2 94%   Visual Acuity Right Eye Distance:   Left Eye Distance:   Bilateral Distance:    Right Eye Near:   Left Eye Near:    Bilateral Near:     Physical Exam Vitals and nursing note reviewed.  Constitutional:      General: She is not in acute distress.    Appearance: Normal  appearance. She is not ill-appearing.  HENT:     Head: Normocephalic and atraumatic.  Eyes:     Pupils: Pupils are equal, round, and reactive to light.  Cardiovascular:     Rate and Rhythm: Normal rate and regular rhythm.     Heart sounds: Normal  heart sounds.  Pulmonary:     Breath sounds: Rhonchi present.     Comments: Tachypnea at 22 Skin:    General: Skin is warm and dry.  Neurological:     General: No focal deficit present.     Mental Status: She is alert and oriented to person, place, and time.  Psychiatric:        Mood and Affect: Mood normal.        Behavior: Behavior normal.      UC Treatments / Results  Labs (all labs ordered are listed, but only abnormal results are displayed) Labs Reviewed - No data to display  EKG   Radiology No results found.  Procedures Procedures (including critical care time)  Medications Ordered in UC Medications - No data to display  Initial Impression / Assessment and Plan / UC Course  I have reviewed the triage vital signs and the nursing notes.  Pertinent labs & imaging results that were available during my care of the patient were reviewed by me and considered in my medical decision making (see chart for details).     I reviewed exam and symptoms with patient.  Discussed limitations and abilities of urgent care.  Patient presenting with worsening cough/shortness of breath after near completion of doxycycline .  Patient primary concern is for PE.  Discussed as she is on Eliquis  this is unlikely however, advised I cannot rule this out in this setting.  If this is her primary concern advise she go to the emergency room for further evaluation.  She is in agreement with plan and will go POV to the ER. Final Clinical Impressions(s) / UC Diagnoses   Final diagnoses:  Pneumonia of right upper lobe due to infectious organism  Shortness of breath  History of pulmonary embolus (PE)     Discharge Instructions      Please go to the emergency for further evaluation of your symptoms    ED Prescriptions   None    PDMP not reviewed this encounter.   Alleen Arbour, NP 06/22/23 1230

## 2023-06-22 NOTE — Progress Notes (Unsigned)
 [facility] [address] [place], [place] [address] [phone]  Date:  06/25/2023   Name:  RAHNIYA GUAN   DOB:  [birth year]   MRN:  [id number]  PCP:  Kaylee Partridge, MD    Chief Complaint: No chief complaint on file.   History of Present Illness:  Stephanie Parks is a 78 y.o. very pleasant female patient who presents with the following:  Patient seen today for follow-up from urgent care I most recently saw her in mid April History of hypertension, hyperlipidemia, allergic rhinitis, OSA on CPAP. She has tended to have frequent ER visits for chest pain likely related to anxiety. She also had COVID-19 complicated by pulmonary embolism January of 2022 -She is no longer using oxygen . Due to recurrent PE in May of 2024 she is now on lifelong eliqus   Lumbar decompression 1998    Since our last visit she was seen in urgent care on 4/28 and diagnosed with pneumonia-sent home with doxycycline  She then ended up in the ER on May 4 Patient Active Problem List   Diagnosis Date Noted   Multiple subsegmental pulmonary emboli without acute cor pulmonale (HCC) 07/23/2022   TIA (transient ischemic attack) 06/08/2022   Diastolic dysfunction 06/08/2022   Obesity (BMI 30-39.9) 06/08/2022   OSA (obstructive sleep apnea) 06/08/2022   Unilateral primary osteoarthritis, right hip 05/29/2022   Status post total replacement of left hip 09/04/2021   DJD (degenerative joint disease) 09/04/2021   Iron deficiency anemia 03/13/2021   Erythropoietin  deficiency anemia 03/13/2021   Unilateral primary osteoarthritis, left hip 10/18/2020   Greater trochanteric bursitis of left hip 10/18/2020   Headache 06/07/2020   Acute respiratory failure with hypoxia (HCC) 05/02/2020   Post-COVID chronic dyspnea 05/02/2020   Pulsatile neck mass 04/26/2020   Pneumonia due to COVID-19 virus 03/12/2020   Headache disorder 01/04/2020   Seasonal allergic  conjunctivitis 10/04/2019   Dysfunction of right eustachian tube 10/04/2019   Pre-diabetes 06/02/2019   Seasonal and perennial allergic rhinitis 12/22/2018   Heart palpitations 12/22/2018   History of chest pain 07/20/2018   Brain aneurysm 07/20/2018   Dyslipidemia 05/04/2018   Tendonitis, Achilles, right 12/08/2017   Unilateral primary osteoarthritis, left knee 10/07/2017   Ingrown toenail 08/10/2017   Coughing 04/24/2017   CAD (coronary artery disease) 03/01/2015   Aneurysm (HCC) 03/01/2015   Dizziness 03/01/2015   Hypokalemia 03/01/2015   Paresthesia of arm 03/01/2015   Achalasia 09/28/2012   Constipation 09/28/2012   Cephalalgia 08/24/2012   Essential hypertension 04/19/2011   Hyperlipidemia 04/19/2011   GERD (gastroesophageal reflux disease) 04/19/2011   Chest pain 04/04/2011    Past Medical History:  Diagnosis Date   Achalasia 09/28/2012   Allergic rhinitis 03/01/2015   Anemia    Aneurysm (HCC) 03/01/2015   stable 5 mm periopthalmic right ICA aneurysm 03/25/21 CTA   BP (high blood pressure) 03/01/2015   Bronchitis 04/23/2018   Cephalalgia 08/24/2012   Overview:  ICD-10 cut over     Chest pain 04/04/2011   Coronary Ca Score 0, no significant CAD 08/20/18; CP with troponin 4>32 s/p LHC showing normal coronaries 12/29/18   CN (constipation) 09/28/2012   Coughing 04/24/2017   Decreased potassium in the blood 03/01/2015   Diverticulosis    Dizziness 03/01/2015   Erythropoietin  deficiency anemia 03/13/2021   GERD (gastroesophageal reflux disease)    Hiatal hernia    Hypercholesterolemia 03/01/2015   Hyperlipidemia  Hypertension    Ingrown toenail 08/10/2017   Inguinal hernia    Iron deficiency anemia 03/13/2021   Migraine headache    Onychomycosis 12/08/2017   Paresthesia of arm 03/01/2015   PE (pulmonary thromboembolism) (HCC) 03/13/2020   in setting of + COVID-19, s/p Eliquis  x 3 month   Schatzki's ring    Tendonitis, Achilles, right 12/08/2017   Unilateral  primary osteoarthritis, left knee 10/07/2017    Past Surgical History:  Procedure Laterality Date   ABDOMINAL HYSTERECTOMY     BACK SURGERY     x 3   CARDIAC CATHETERIZATION  12/29/2018   angiographically normal coronary arteries 12/29/18 ([place])   CARPAL TUNNEL RELEASE     left wrist   CERVICAL SPINE SURGERY     HEMORRHOID SURGERY     TOTAL HIP ARTHROPLASTY Left 09/04/2021   Procedure: LEFT TOTAL HIP ARTHROPLASTY ANTERIOR APPROACH;  Surgeon: Arnie Lao, MD;  Location: [facility] OR;  Service: Orthopedics;  Laterality: Left;    Social History   Tobacco Use   Smoking status: Former    Current packs/day: 0.00    Average packs/day: 0.5 packs/day for 20.0 years (10.0 ttl pk-yrs)    Types: Cigarettes    Start date: 02/19/1964    Quit date: 02/19/1984    Years since quitting: 39.3   Smokeless tobacco: Never  Vaping Use   Vaping status: Never Used  Substance Use Topics   Alcohol  use: No   Drug use: No    Family History  Problem Relation Age of Onset   Allergic rhinitis Sister    Diabetes Sister    Hypertension Sister    Heart attack Father 28   Heart disease Father    Stomach cancer Maternal Grandmother    Heart disease Mother    Colon cancer Neg Hx    Angioedema Neg Hx    Asthma Neg Hx    Eczema Neg Hx    Urticaria Neg Hx    Immunodeficiency Neg Hx    Breast cancer Neg Hx    Migraines Neg Hx    Headache Neg Hx     Allergies  Allergen Reactions   Methylprednisolone  Other (See Comments)    Lightheaded and "Hallucinations"   Shellfish Allergy Hives and Swelling   Ace Inhibitors Other (See Comments)    Pt cannot recall her reaction but tolerates arb    Gabapentin  Other (See Comments)    headaches   Pitavastatin     Unknown reaction  Other Reaction(s): Other (See Comments)  ADVERSE REACTION,  Pt unaware of this reaction; states she has been on crestor  in the past with no issues   Sulfa Antibiotics Other (See Comments) and Rash    Fine bumps    Topiramate  Palpitations    Heart race,     Medication list has been reviewed and updated.  Current Outpatient Medications on File Prior to Visit  Medication Sig Dispense Refill   apixaban  (ELIQUIS ) 5 MG TABS tablet Take 1 tablet (5 mg total) by mouth 2 (two) times daily. 60 tablet 0   Calcium  Carb-Cholecalciferol (CALCIUM  600 + D PO) Take 1 tablet by mouth daily.     carboxymethylcellulose (REFRESH PLUS) 0.5 % SOLN Place 1 drop into both eyes 3 (three) times daily as needed (dry eyes).     carvedilol  (COREG ) 6.25 MG tablet Take 1 tablet (6.25 mg total) by mouth 2 (two) times daily with a meal. 180 tablet 1   Cyanocobalamin  (B-12 PO) Take 2 tablets by  mouth daily.     doxycycline  (VIBRAMYCIN ) 100 MG capsule Take 1 capsule (100 mg total) by mouth 2 (two) times daily for 7 days. 14 capsule 0   ELDERBERRY PO Take 1 capsule by mouth daily.     EPINEPHrine  (EPIPEN  2-PAK) 0.3 mg/0.3 mL IJ SOAJ injection Inject 0.3 mg into the muscle as needed for anaphylaxis. Per allergen immunotherapy protocol 1 each 0   fluticasone  (FLONASE ) 50 MCG/ACT nasal spray Place 2 sprays into both nostrils daily as needed for allergies or rhinitis. 48 g 1   linaclotide  (LINZESS ) 145 MCG CAPS capsule Take 145 mcg by mouth.     losartan -hydrochlorothiazide  (HYZAAR ) 50-12.5 MG tablet Take 0.5 tablets by mouth daily. 45 tablet 3   Multiple Vitamins-Minerals (ALIVE MULTI-VITAMIN PO) Take 1 tablet by mouth daily.     NON FORMULARY Pt uses a cpap nightly     nortriptyline  (PAMELOR ) 25 MG capsule Take 2 capsules (50 mg total) by mouth at bedtime. 180 capsule 1   omeprazole  (PRILOSEC) 40 MG capsule Take 1 capsule (40 mg total) by mouth daily. 90 capsule 3   polyethylene glycol (MIRALAX ) 17 g packet Take 17 g by mouth daily as needed for mild constipation or moderate constipation. 90 packet 2   potassium chloride  SA (KLOR-CON  M20) 20 MEQ tablet Take 2 tablets (40 mEq total) by mouth daily. 180 tablet 1   pregabalin  (LYRICA ) 75 MG  capsule TAKE 1 CAPSULE BY MOUTH TWICE A DAY 60 capsule 0   rosuvastatin  (CRESTOR ) 20 MG tablet Take 1 tablet (20 mg total) by mouth daily. 90 tablet 3   spironolactone  (ALDACTONE ) 25 MG tablet Take 0.5 tablets (12.5 mg total) by mouth daily. 45 tablet 3   traMADol  (ULTRAM ) 50 MG tablet Take 1 tablet (50 mg total) by mouth every 8 (eight) hours as needed. 60 tablet 0   No current facility-administered medications on file prior to visit.    Review of Systems:  As per HPI- otherwise negative.   Physical Examination: There were no vitals filed for this visit. There were no vitals filed for this visit. There is no height or weight on file to calculate BMI. Ideal Body Weight:    GEN: no acute distress. HEENT: Atraumatic, Normocephalic.  Ears and Nose: No external deformity. CV: RRR, No M/G/R. No JVD. No thrill. No extra heart sounds. PULM: CTA B, no wheezes, crackles, rhonchi. No retractions. No resp. distress. No accessory muscle use. ABD: S, NT, ND, +BS. No rebound. No HSM. EXTR: No c/c/e PSYCH: Normally interactive. Conversant.    Assessment and Plan: ***  Signed Gates Kasal, MD

## 2023-06-22 NOTE — ED Provider Notes (Signed)
 [place] [facility] AT [facility] Provider Note   CSN: [id number] Arrival date & time: 06/22/23  1257     History {Add pertinent medical, surgical, social history, OB history to HPI:1} No chief complaint on file.   Stephanie Parks is a 78 y.o. female with past medical history of HTN, HLD, CAD, GERD, IDA, TIA, OSA, PE (2024 and currently on Eliquis ) presents emergency department for evaluation of continued minimally productive cough and shortness of breath for the past 3 weeks.  She was diagnosed with right upper lobe pneumonia on 4/28 and has been taking doxycycline .  She is on day 6/7 and has been compliant.  Shortness of breath worsens with exertion.  She endorses 1 episode of chest pressure last week that lasted a couple seconds while she was lying down but has not had any since and is not currently complaining of chest pain.  Although she is committed and compliant with Eliquis , she is concerned about PE as she had one last year.  Was initially evaluated by urgent care  HPI     Home Medications Prior to Admission medications   Medication Sig Start Date End Date Taking? Authorizing Provider  apixaban  (ELIQUIS ) 5 MG TABS tablet Take 1 tablet (5 mg total) by mouth 2 (two) times daily. 12/31/22   Saguier, Gaylin Ke, PA-C  Calcium  Carb-Cholecalciferol (CALCIUM  600 + D PO) Take 1 tablet by mouth daily.    [provider]  carboxymethylcellulose (REFRESH PLUS) 0.5 % SOLN Place 1 drop into both eyes 3 (three) times daily as needed (dry eyes).    [provider]  carvedilol  (COREG ) 6.25 MG tablet Take 1 tablet (6.25 mg total) by mouth 2 (two) times daily with a meal. 01/15/23   Copland, Skipper Dumas, MD  Cyanocobalamin  (B-12 PO) Take 2 tablets by mouth daily.    [provider]  doxycycline  (VIBRAMYCIN ) 100 MG capsule Take 1 capsule (100 mg total) by mouth 2 (two) times daily for 7 days. 06/16/23 06/23/23  Lavonia Powers, PA-C  ELDERBERRY PO Take 1  capsule by mouth daily.    [provider]  EPINEPHrine  (EPIPEN  2-PAK) 0.3 mg/0.3 mL IJ SOAJ injection Inject 0.3 mg into the muscle as needed for anaphylaxis. Per allergen immunotherapy protocol 03/17/23   Orelia Binet, MD  fluticasone  (FLONASE ) 50 MCG/ACT nasal spray Place 2 sprays into both nostrils daily as needed for allergies or rhinitis. 05/15/23   Copland, Skipper Dumas, MD  linaclotide  (LINZESS ) 145 MCG CAPS capsule Take 145 mcg by mouth. 05/22/23   [provider]  losartan -hydrochlorothiazide  (HYZAAR ) 50-12.5 MG tablet Take 0.5 tablets by mouth daily. 03/13/23   Copland, Jessica C, MD  Multiple Vitamins-Minerals (ALIVE MULTI-VITAMIN PO) Take 1 tablet by mouth daily.    [provider]  NON FORMULARY Pt uses a cpap nightly    [provider]  nortriptyline  (PAMELOR ) 25 MG capsule Take 2 capsules (50 mg total) by mouth at bedtime. 10/24/22   Copland, Skipper Dumas, MD  omeprazole  (PRILOSEC) 40 MG capsule Take 1 capsule (40 mg total) by mouth daily. 08/19/22   Copland, Jessica C, MD  polyethylene glycol (MIRALAX ) 17 g packet Take 17 g by mouth daily as needed for mild constipation or moderate constipation. 07/01/22   Copland, Jessica C, MD  potassium chloride  SA (KLOR-CON  M20) 20 MEQ tablet Take 2 tablets (40 mEq total) by mouth daily. 01/15/23   Copland, Skipper Dumas, MD  pregabalin  (LYRICA ) 75 MG capsule TAKE 1 CAPSULE BY MOUTH TWICE  A DAY 04/01/23   Arnie Lao, MD  rosuvastatin  (CRESTOR ) 20 MG tablet Take 1 tablet (20 mg total) by mouth daily. 04/03/23   Copland, Skipper Dumas, MD  spironolactone  (ALDACTONE ) 25 MG tablet Take 0.5 tablets (12.5 mg total) by mouth daily. 08/19/22   Copland, Skipper Dumas, MD  traMADol  (ULTRAM ) 50 MG tablet Take 1 tablet (50 mg total) by mouth every 8 (eight) hours as needed. 09/26/22   Copland, Skipper Dumas, MD      Allergies    Methylprednisolone , Shellfish allergy, Ace inhibitors, Gabapentin , Pitavastatin, Sulfa antibiotics, and Topiramate      Review of Systems   Review of Systems  Constitutional:  Negative for chills, fatigue and fever.  Respiratory:  Positive for cough and shortness of breath. Negative for chest tightness and wheezing.   Cardiovascular:  Negative for chest pain and palpitations.  Gastrointestinal:  Negative for abdominal pain, constipation, diarrhea, nausea and vomiting.  Neurological:  Negative for dizziness, seizures, weakness, light-headedness, numbness and headaches.    Physical Exam Updated Vital Signs BP (!) 142/72   Pulse 87   Temp 98.1 F (36.7 C) (Oral)   Resp 16   SpO2 99%  Physical Exam Vitals and nursing note reviewed.  Constitutional:      General: She is not in acute distress.    Appearance: Normal appearance. She is not ill-appearing.  HENT:     Head: Normocephalic and atraumatic.  Eyes:     Conjunctiva/sclera: Conjunctivae normal.  Cardiovascular:     Rate and Rhythm: Normal rate.  Pulmonary:     Effort: Pulmonary effort is normal. No respiratory distress.     Breath sounds: Examination of the right-upper field reveals rhonchi. Examination of the left-upper field reveals rhonchi. Examination of the right-lower field reveals rhonchi. Examination of the left-lower field reveals rhonchi. Rhonchi present. No rales.  Musculoskeletal:     Right lower leg: No edema.     Left lower leg: No edema.  Skin:    General: Skin is warm.     Capillary Refill: Capillary refill takes less than 2 seconds.     Coloration: Skin is not jaundiced or pale.  Neurological:     Mental Status: She is alert and oriented to person, place, and time. Mental status is at baseline.     ED Results / Procedures / Treatments   Labs (all labs ordered are listed, but only abnormal results are displayed) Labs Reviewed  CBC - Abnormal; Notable for the following components:      Result Value   RBC 3.81 (*)    Hemoglobin 10.8 (*)    HCT 33.4 (*)    All other components within normal limits  BASIC METABOLIC  PANEL WITH GFR - Abnormal; Notable for the following components:   Potassium 2.9 (*)    Anion gap 16 (*)    All other components within normal limits  PRO BRAIN NATRIURETIC PEPTIDE  TROPONIN T, HIGH SENSITIVITY    EKG EKG Interpretation Date/Time:  Sunday Jun 22 2023 13:23:18 EDT Ventricular Rate:  78 PR Interval:  202 QRS Duration:  88 QT Interval:  394 QTC Calculation: 449 R Axis:   -12  Text Interpretation: Normal sinus rhythm Cannot rule out Anterior infarct , age undetermined Abnormal ECG When compared with ECG of 16-Jun-2023 13:51, PREVIOUS ECG IS PRESENT Confirmed by Lowery Rue [phone]) on 06/22/2023 2:03:39 PM  Radiology DG Chest 2 View Result Date: 06/22/2023 CLINICAL DATA:  cp EXAM: CHEST - 2 VIEW COMPARISON:  06/16/2023.  FINDINGS: Cardiac silhouette is unremarkable. No pneumothorax or pleural effusion. The lungs are clear. The visualized skeletal structures are unremarkable. IMPRESSION: No acute cardiopulmonary process. Electronically Signed   By: Sydell Eva M.D.   On: 06/22/2023 13:50    Procedures Procedures  {Document cardiac monitor, telemetry assessment procedure when appropriate:1}  Medications Ordered in ED Medications  potassium chloride  SA (KLOR-CON  M) CR tablet 40 mEq (has no administration in time range)    ED Course/ Medical Decision Making/ A&P   {   Click here for ABCD2, HEART and other calculatorsREFRESH Note before signing :1}                              Medical Decision Making Amount and/or Complexity of Data Reviewed Labs: ordered. Radiology: ordered.  Risk Prescription drug management.     Patient presents to the ED for concern of ***, this involves an extensive number of treatment options, and is a complaint that carries with it a high risk of complications and morbidity.  The differential diagnosis includes ***   Co morbidities that complicate the patient evaluation  ***   Additional history obtained:  Additional  history obtained from *** {Blank multiple:19196::"EMS","Family","Nursing","Outside Medical Records","Past Admission"}   External records from outside source obtained and reviewed including ***   Lab Tests:  I Ordered, and personally interpreted labs.  The pertinent results include:  ***   Imaging Studies ordered:  I ordered imaging studies including ***  I independently visualized and interpreted imaging which showed *** I agree with the radiologist interpretation   Cardiac Monitoring:  The patient was maintained on a cardiac monitor.  I personally viewed and interpreted the cardiac monitored which showed an underlying rhythm of: ***   Medicines ordered and prescription drug management:  I ordered medication including ***  for ***  Reevaluation of the patient after these medicines showed that the patient {resolved/improved/worsened:23923::"improved"} I have reviewed the patients home medicines and have made adjustments as needed   Test Considered:  ***   Critical Interventions:  ***   Consultations Obtained:  I requested consultation with the ***,  and discussed lab and imaging findings as well as pertinent plan - they recommend: ***   Problem List / ED Course:  SHOB S/p PNA   Reevaluation:  After the interventions noted above, I reevaluated the patient and found that they have :{resolved/improved/worsened:23923::"improved"}   Social Determinants of Health:  ***   Dispostion:  After consideration of the diagnostic results and the patients response to treatment, I feel that the patent would benefit from ***.    {Document critical care time when appropriate:1} {Document review of labs and clinical decision tools ie heart score, Chads2Vasc2 etc:1}  {Document your independent review of radiology images, and any outside records:1} {Document your discussion with family members, caretakers, and with consultants:1} {Document social determinants of health  affecting pt's care:1} {Document your decision making why or why not admission, treatments were needed:1} Final Clinical Impression(s) / ED Diagnoses Final diagnoses:  None    Rx / DC Orders ED Discharge Orders     None

## 2023-06-22 NOTE — ED Notes (Signed)
 ED Provider at bedside.

## 2023-06-22 NOTE — Discharge Instructions (Addendum)
 Thank for letting us  evaluate you today.  Your CT of your chest did not show any pneumonia nor blood clot.  Symptoms are likely secondary to previous pneumonia/bronchitis.  We have given you a nebulizer treatment in emergency department as well as potassium for mildly low potassium.  I have also sent a steroid burst to your pharmacy to help with coughing, shortness of breath  Please follow-up with PCP this week for reevaluation, low potassium

## 2023-06-22 NOTE — ED Triage Notes (Signed)
 Pt was dx with with pneumonia on Monday and began antibiotics and has been taking them as prescribed.  Pt states that she is not feeling any better. She reports continued cough (minimally productive) and sob.  SPo2 in triage 100% on RA.

## 2023-06-22 NOTE — Discharge Instructions (Signed)
Please go to the emergency for further evaluation of your symptoms

## 2023-06-24 ENCOUNTER — Telehealth: Payer: Self-pay

## 2023-06-24 NOTE — Transitions of Care (Post Inpatient/ED Visit) (Signed)
 06/24/2023  Name: Stephanie Parks MRN: [id number] DOB: 06-24-1945  Today's TOC FU Call Status: Today's TOC FU Call Status:: Successful TOC FU Call Completed TOC FU Call Complete Date: 06/24/23 Patient's Name and Date of Birth confirmed.  Transition Care Management Follow-up Telephone Call Date of Discharge: 06/22/23 Discharge Facility: [facility] Type of Discharge: Emergency Department Reason for ED Visit: Other: (pneumonia) How have you been since you were released from the hospital?: Better Any questions or concerns?: No  Items Reviewed: Did you receive and understand the discharge instructions provided?: Yes Medications obtained,verified, and reconciled?: Yes (Medications Reviewed) Any new allergies since your discharge?: No Dietary orders reviewed?: Yes Do you have support at home?: No  Medications Reviewed Today: Medications Reviewed Today     Reviewed by Darrall Ellison, LPN (Licensed Practical Nurse) on 06/24/23 at 1706  Med List Status: <None>   Medication Order Taking? Sig Documenting Provider Last Dose Status Informant  albuterol  (VENTOLIN  HFA) 108 (90 Base) MCG/ACT inhaler [id number]  Inhale 1-2 puffs into the lungs every 6 (six) hours as needed for wheezing or shortness of breath. Royann Cords, PA  Active   apixaban  (ELIQUIS ) 5 MG TABS tablet [id number] No Take 1 tablet (5 mg total) by mouth 2 (two) times daily. Saguier, Gaylin Ke, PA-C Taking Active   Calcium  Carb-Cholecalciferol (CALCIUM  600 + D PO) [id number] No Take 1 tablet by mouth daily. [provider] Taking Active Self, Pharmacy Records  carboxymethylcellulose (REFRESH PLUS) 0.5 % SOLN [id number] No Place 1 drop into both eyes 3 (three) times daily as needed (dry eyes). [provider] Taking Active Self, Pharmacy Records  carvedilol  (COREG ) 6.25 MG tablet [id number] No Take 1 tablet (6.25 mg total) by mouth 2 (two) times daily with a meal. Copland, Skipper Dumas, MD Taking Active    Cyanocobalamin  (B-12 PO) [id number] No Take 2 tablets by mouth daily. [provider] Taking Active Self, Pharmacy Records  ELDERBERRY PO [id number] No Take 1 capsule by mouth daily. [provider] Taking Active Self, Pharmacy Records  EPINEPHrine  (EPIPEN  2-PAK) 0.3 mg/0.3 mL IJ SOAJ injection [id number] No Inject 0.3 mg into the muscle as needed for anaphylaxis. Per allergen immunotherapy protocol Orelia Binet, MD Taking Active   fluticasone  (FLONASE ) 50 MCG/ACT nasal spray [id number] No Place 2 sprays into both nostrils daily as needed for allergies or rhinitis. Copland, Skipper Dumas, MD Taking Active   linaclotide  (LINZESS ) 145 MCG CAPS capsule [id number]  Take 145 mcg by mouth. [provider]  Active   losartan -hydrochlorothiazide  (HYZAAR ) 50-12.5 MG tablet [id number] No Take 0.5 tablets by mouth daily. Copland, Skipper Dumas, MD Taking Active   Multiple Vitamins-Minerals (ALIVE MULTI-VITAMIN PO) [id number] No Take 1 tablet by mouth daily. [provider] Taking Active Self, Pharmacy Records  NON FORMULARY [id number] No Pt uses a cpap nightly [provider] Taking Active Self, Pharmacy Records  nortriptyline  (PAMELOR ) 25 MG capsule [id number] No Take 2 capsules (50 mg total) by mouth at bedtime. Copland, Skipper Dumas, MD Taking Active            Med Note Alida Ion, [place] B   Wed Oct 30, 2022  9:15 AM) For headache prevention  omeprazole  (PRILOSEC) 40 MG capsule [id number] No Take 1 capsule (40 mg total) by mouth daily. Copland, Skipper Dumas, MD Taking Active   polyethylene glycol (MIRALAX ) 17 g packet [id number] No Take 17 g by mouth daily as needed for mild constipation or moderate constipation. Copland, Skipper Dumas, MD Taking Active   potassium  chloride SA (KLOR-CON  M20) 20 MEQ tablet [id number] No Take 2 tablets (40 mEq total) by mouth daily. Copland, Skipper Dumas, MD Taking Active   predniSONE  (STERAPRED UNI-PAK 21 TAB) 10 MG (21) TBPK tablet [id number]  Take by mouth  daily. Take 6 tabs by mouth daily  for 2 days, then 5 tabs for 2 days, then 4 tabs for 2 days, then 3 tabs for 2 days, 2 tabs for 2 days, then 1 tab by mouth daily for 2 days Royann Cords, PA  Active   pregabalin  (LYRICA ) 75 MG capsule [id number] No TAKE 1 CAPSULE BY MOUTH TWICE A DAY Arnie Lao, MD Taking Active   rosuvastatin  (CRESTOR ) 20 MG tablet [id number] No Take 1 tablet (20 mg total) by mouth daily. Copland, Skipper Dumas, MD Taking Active   spironolactone  (ALDACTONE ) 25 MG tablet [id number] No Take 0.5 tablets (12.5 mg total) by mouth daily. Copland, Skipper Dumas, MD Taking Active   traMADol  (ULTRAM ) 50 MG tablet [id number] No Take 1 tablet (50 mg total) by mouth every 8 (eight) hours as needed. Copland, Skipper Dumas, MD Taking Active             Home Care and Equipment/Supplies: Were Home Health Services Ordered?: NA Any new equipment or medical supplies ordered?: NA  Functional Questionnaire: Do you need assistance with bathing/showering or dressing?: No Do you need assistance with meal preparation?: No Do you need assistance with eating?: No Do you have difficulty maintaining continence: No Do you need assistance with getting out of bed/getting out of a chair/moving?: No Do you have difficulty managing or taking your medications?: No  Follow up appointments reviewed: PCP Follow-up appointment confirmed?: Yes Date of PCP follow-up appointment?: 06/25/23 Follow-up Provider: [facility] Follow-up appointment confirmed?: NA Do you need transportation to your follow-up appointment?: No Do you understand care options if your condition(s) worsen?: Yes-patient verbalized understanding    SIGNATURE Darrall Ellison, LPN [facility] Nurse Health Advisor Direct Dial [phone]

## 2023-06-25 ENCOUNTER — Ambulatory Visit: Admitting: Orthopaedic Surgery

## 2023-06-25 ENCOUNTER — Ambulatory Visit (INDEPENDENT_AMBULATORY_CARE_PROVIDER_SITE_OTHER): Admitting: Family Medicine

## 2023-06-25 ENCOUNTER — Ambulatory Visit: Admitting: Family Medicine

## 2023-06-25 ENCOUNTER — Encounter: Payer: Self-pay | Admitting: Family Medicine

## 2023-06-25 VITALS — BP 130/60 | HR 88 | Ht 64.0 in | Wt 227.8 lb

## 2023-06-25 DIAGNOSIS — Z6839 Body mass index (BMI) 39.0-39.9, adult: Secondary | ICD-10-CM

## 2023-06-25 DIAGNOSIS — E669 Obesity, unspecified: Secondary | ICD-10-CM | POA: Diagnosis not present

## 2023-06-25 DIAGNOSIS — E876 Hypokalemia: Secondary | ICD-10-CM | POA: Diagnosis not present

## 2023-06-25 DIAGNOSIS — J189 Pneumonia, unspecified organism: Secondary | ICD-10-CM

## 2023-06-25 DIAGNOSIS — D649 Anemia, unspecified: Secondary | ICD-10-CM | POA: Diagnosis not present

## 2023-06-25 MED ORDER — WEGOVY 0.25 MG/0.5ML ~~LOC~~ SOAJ: 0.2500 mg | SUBCUTANEOUS | 1 refills | Status: DC

## 2023-06-25 NOTE — Patient Instructions (Signed)
 Good to see you today- please let me know if you don't continue to improve!   I will be in touch with your labs Start on the Wegovy 0.25 mg weekly for one month, we can then go up to 0.5 mg assuming you tolerate it ok- keep me posted

## 2023-06-26 ENCOUNTER — Encounter: Payer: Self-pay | Admitting: Family Medicine

## 2023-06-26 LAB — BASIC METABOLIC PANEL WITH GFR
BUN: 20 mg/dL (ref 6–23)
CO2: 30 meq/L (ref 19–32)
Calcium: 8.9 mg/dL (ref 8.4–10.5)
Chloride: 103 meq/L (ref 96–112)
Creatinine, Ser: 0.95 mg/dL (ref 0.40–1.20)
GFR: 57.71 mL/min — ABNORMAL LOW (ref >60.00)
Glucose, Bld: 81 mg/dL (ref 70–99)
Potassium: 3 meq/L — ABNORMAL LOW (ref 3.5–5.1)
Sodium: 142 meq/L (ref 135–145)

## 2023-06-26 LAB — CBC
HCT: 31.7 % — ABNORMAL LOW (ref 36.0–46.0)
Hemoglobin: 10.6 g/dL — ABNORMAL LOW (ref 12.0–15.0)
MCHC: 33.6 g/dL (ref 30.0–36.0)
MCV: 87.2 fl (ref 78.0–100.0)
Platelets: 385 10*3/uL (ref 150.0–400.0)
RBC: 3.63 Mil/uL — ABNORMAL LOW (ref 3.87–5.11)
RDW: 14.8 % (ref 11.5–15.5)
WBC: 10.5 10*3/uL (ref 4.0–10.5)

## 2023-06-26 LAB — MAGNESIUM: Magnesium: 1.6 mg/dL (ref 1.5–2.5)

## 2023-06-26 NOTE — Addendum Note (Signed)
 Addended by: Gates Kasal C on: 06/26/2023 01:25 PM   Modules accepted: Orders

## 2023-07-02 ENCOUNTER — Ambulatory Visit: Admitting: Orthopaedic Surgery

## 2023-07-02 ENCOUNTER — Other Ambulatory Visit: Payer: Self-pay | Admitting: Family Medicine

## 2023-07-02 ENCOUNTER — Other Ambulatory Visit (INDEPENDENT_AMBULATORY_CARE_PROVIDER_SITE_OTHER): Payer: Self-pay

## 2023-07-02 VITALS — Ht 64.0 in | Wt 228.0 lb

## 2023-07-02 DIAGNOSIS — G8929 Other chronic pain: Secondary | ICD-10-CM | POA: Diagnosis not present

## 2023-07-02 DIAGNOSIS — M25562 Pain in left knee: Secondary | ICD-10-CM | POA: Diagnosis not present

## 2023-07-02 DIAGNOSIS — M1611 Unilateral primary osteoarthritis, right hip: Secondary | ICD-10-CM | POA: Diagnosis not present

## 2023-07-02 DIAGNOSIS — I10 Essential (primary) hypertension: Secondary | ICD-10-CM

## 2023-07-02 DIAGNOSIS — M1712 Unilateral primary osteoarthritis, left knee: Secondary | ICD-10-CM | POA: Diagnosis not present

## 2023-07-02 DIAGNOSIS — M1711 Unilateral primary osteoarthritis, right knee: Secondary | ICD-10-CM

## 2023-07-02 MED ORDER — METHYLPREDNISOLONE ACETATE 40 MG/ML IJ SUSP
40.0000 mg | INTRAMUSCULAR | Status: AC | PRN
  Administered 2023-07-02: 40 mg via INTRA_ARTICULAR

## 2023-07-02 MED ORDER — LIDOCAINE HCL 1 % IJ SOLN
3.0000 mL | INTRAMUSCULAR | Status: AC | PRN
  Administered 2023-07-02: 3 mL

## 2023-07-02 NOTE — Progress Notes (Signed)
 The patient is well-known to us .  We replaced her left hip back in July 2023 secondary to severe arthritis in the left hip.  She was originally scheduled for a right hip replacement in June of last year but unfortunately developed significant blood clots and had to be put on Eliquis .  She is on Eliquis  chronically now.  She can come off of this now for 3 days prior to any type of joint replacement surgery.  She is ready to get back scheduled for a right total hip arthroplasty.  Her BMI is 39.14.  She is a prediabetic.  I did review all of her past medical history and medications within epic.  She has been having a lot of left knee pain has become more of a constant type of pain as well.  Examination of her left operative hip shows that moves smoothly and fluidly.  The right hip has severe pain in the groin with internal and external rotation with a lot of stiffness with rotation.  Both knees have varus malalignment with medial joint line tenderness and patellofemoral crepitation with good range of motion.  X-rays of the left knee today on the AP view of both knees shows bone-on-bone wear of the medial compartment the knee withleft.  The lateral side of the left knee also shows large osteophytes in all 3 compartments and complete severe end-stage arthritis of the left knee.  We did place a steroid injection in her left knee today to help temporize the pain of her left knee.  She is ready to be scheduled for a right total hip arthroplasty.  Having had this before she is fully aware of the risks and benefits of this type of surgery we will work on getting her scheduled for a right total hip arthroplasty in the near future.    Procedure Note  Patient: Stephanie Parks             Date of Birth: [birth year]           MRN: [id number]             Visit Date: 07/02/2023  Procedures: Visit Diagnoses:  1. Chronic pain of left knee   2. Unilateral primary osteoarthritis, left knee   3. Unilateral primary  osteoarthritis, right knee   4. Unilateral primary osteoarthritis, right hip     Large Joint Inj: L knee on 07/02/2023 10:06 AM Indications: diagnostic evaluation and pain Details: 22 G 1.5 in needle, superolateral approach  Arthrogram: No  Medications: 3 mL lidocaine  1 %; 40 mg methylPREDNISolone  acetate 40 MG/ML Outcome: tolerated well, no immediate complications Procedure, treatment alternatives, risks and benefits explained, specific risks discussed. Consent was given by the patient. Immediately prior to procedure a time out was called to verify the correct patient, procedure, equipment, support staff and site/side marked as required. Patient was prepped and draped in the usual sterile fashion.

## 2023-07-16 ENCOUNTER — Ambulatory Visit (INDEPENDENT_AMBULATORY_CARE_PROVIDER_SITE_OTHER)

## 2023-07-16 ENCOUNTER — Other Ambulatory Visit (INDEPENDENT_AMBULATORY_CARE_PROVIDER_SITE_OTHER)

## 2023-07-16 DIAGNOSIS — D649 Anemia, unspecified: Secondary | ICD-10-CM | POA: Diagnosis not present

## 2023-07-16 DIAGNOSIS — J309 Allergic rhinitis, unspecified: Secondary | ICD-10-CM

## 2023-07-17 ENCOUNTER — Encounter: Payer: Self-pay | Admitting: Family Medicine

## 2023-07-17 ENCOUNTER — Other Ambulatory Visit: Payer: Self-pay | Admitting: Family Medicine

## 2023-07-17 LAB — FECAL OCCULT BLOOD, IMMUNOCHEMICAL: Fecal Occult Bld: NEGATIVE

## 2023-07-22 ENCOUNTER — Encounter: Payer: Self-pay | Admitting: Pharmacist

## 2023-07-22 NOTE — Progress Notes (Signed)
   07/22/2023 Name: Stephanie Parks MRN: [id number] DOB: [birth year]  Chief Complaint  Patient presents with   Medication Management    Stephanie Parks is a 78 y.o. year old female who presented for a telephone visit.   They were referred to the pharmacist by their PCP for assistance in managing medication access.    Subjective:  Patient was referred to Clinical Pharmacist Practitioner to assist with initiation of Wegovy .  Patient is noted to have history of TIA 05/2022 and also CAD.  Also noted to have BMI of 39.1 Height = 5\' 4"   Wt Readings from Last 3 Encounters:  07/02/23 228 lb (103.4 kg)  06/25/23 227 lb 12.8 oz (103.3 kg)  06/16/23 228 lb 14.4 oz (103.8 kg)    BP Readings from Last 3 Encounters:  06/25/23 130/60  06/22/23 (!) 154/76  06/22/23 124/78   Exercise - has not been able to exercise due to other conditions - chronic back and knee pain; still working part time and gets some exercise / stands and walks on her job.  Diet -  Finds it hard to not eat at night - appetite is harder to control at night.  She craves sweets - she feels this is her major problem Eats 2 meals per day and a snack at night  Meals - fried fish, baked chicken and [place]. Little pork or beef.  Vegetables - lima beans, green beans, greens, cabbage, squash Snack - bowl of cereal with milk, ice cream (about 2 times per month)  Unable to eat nuts due to difficulty digesting nuts.   Objective:  Lab Results  Component Value Date   HGBA1C 6.1 (H) 06/08/2022    Lab Results  Component Value Date   CREATININE 0.95 06/25/2023   BUN 20 06/25/2023   NA 142 06/25/2023   K 3.0 (L) 06/25/2023   CL 103 06/25/2023   CO2 30 06/25/2023    Lab Results  Component Value Date   CHOL 180 06/08/2022   HDL 67 06/08/2022   LDLCALC 101 (H) 06/08/2022   TRIG 62 06/08/2022   CHOLHDL 2.7 06/08/2022      Assessment/Plan:  History of TIA / CAD - secondary prevention of ASCVD + obesity class 2:  -  would like to try Wegovy  or Zepbound. Since Wegovy  is approved for secondary prevention of CAD / ASCVD will try for prior authorization - Discussed limiting intake of sugar, fried foods. Recommended she try to choose foods with lower glycemic index. Provided a list.  - Increase physical activity - goal is 150 minutes of physical (Key: [id number])  Follow up on 1 to 2 weeks.    Cecilie Coffee, PharmD Clinical Pharmacist [facility]

## 2023-07-23 ENCOUNTER — Ambulatory Visit (INDEPENDENT_AMBULATORY_CARE_PROVIDER_SITE_OTHER)

## 2023-07-23 DIAGNOSIS — J309 Allergic rhinitis, unspecified: Secondary | ICD-10-CM | POA: Diagnosis not present

## 2023-07-29 ENCOUNTER — Other Ambulatory Visit: Payer: Self-pay | Admitting: Pharmacist

## 2023-07-29 MED ORDER — WEGOVY 0.5 MG/0.5ML ~~LOC~~ SOAJ: 0.5000 mg | SUBCUTANEOUS | 1 refills | Status: DC

## 2023-07-29 NOTE — Progress Notes (Signed)
   07/29/2023 Name: Stephanie Parks MRN: [id number] DOB: August 07, 1945  Chief Complaint  Patient presents with   Medication Management    Stephanie Parks is a 78 y.o. year old female who presented for a telephone visit.   They were referred to the pharmacist by their PCP for assistance in managing medication access.    Subjective:  Patient was referred to Clinical Pharmacist Practitioner to assist with initiation of Wegovy .  Patient is noted to have history of TIA 05/2022 and also CAD.  Submitted prior authorization last week. Wegovy  was approved thru 02/18/2024.   Noted to have BMI of 39.1 Height = 5\' 4"   Wt Readings from Last 3 Encounters:  07/02/23 228 lb (103.4 kg)  06/25/23 227 lb 12.8 oz (103.3 kg)  06/16/23 228 lb 14.4 oz (103.8 kg)    BP Readings from Last 3 Encounters:  06/25/23 130/60  06/22/23 (!) 154/76  06/22/23 124/78   Exercise - Patient has not been able to exercise due to other conditions - chronic back and knee pain; still working part time and gets some exercise / stands and walks on her job.  Diet - Reviewed at last visit Finds it hard to not eat at night - appetite is harder to control at night.  She craves sweets - she feels this is her major problem Eats 2 meals per day and a snack at night  Meals - fried fish, baked chicken and [place]. Little pork or beef.  Vegetables - lima beans, green beans, greens, cabbage, squash Snack - bowl of cereal with milk, ice cream (about 2 times per month)  Unable to eat nuts due to difficulty digesting nuts.   Objective:  Lab Results  Component Value Date   HGBA1C 6.1 (H) 06/08/2022    Lab Results  Component Value Date   CREATININE 0.95 06/25/2023   BUN 20 06/25/2023   NA 142 06/25/2023   K 3.0 (L) 06/25/2023   CL 103 06/25/2023   CO2 30 06/25/2023    Lab Results  Component Value Date   CHOL 180 06/08/2022   HDL 67 06/08/2022   LDLCALC 101 (H) 06/08/2022   TRIG 62 06/08/2022   CHOLHDL 2.7  06/08/2022      Assessment/Plan:  History of TIA / CAD - secondary prevention of ASCVD + obesity class 2:  - Checked Cover My Meds - (Key: [id number]). Wegovy  0.5mg  weekly was approved but not sure what copay will be. Patient notified and she reports she is on the monthly Medicare plan for Rx cost. I suspect she will potential pay between $80 and $120 per month. Recommended she contact [employer] [facility] to get cost estimate. If patient is agreeable to monthly medication cost, I will give her samples for 1 month of Wegovy  0.25mg , then she will increase 0.5mg  weekly.  - continue to limit intake of sugar, fried foods.  - Increase physical activity - goal is 150 minutes of physical   Follow up on 2 to 4 weeks    Cecilie Coffee, PharmD Clinical Pharmacist [facility]    Addendum:  Patient called back and states cost of Wegovy  will be $47 per month. She will come to office tomorrow for sample and to get instruction on her first injection.   Cecilie Coffee, PharmD Clinical Pharmacist [facility]  Population Health [phone]

## 2023-07-30 ENCOUNTER — Telehealth: Payer: Self-pay | Admitting: Orthopaedic Surgery

## 2023-07-30 NOTE — Telephone Encounter (Signed)
 Patient called, says she needs a letter stating that she will be having surgery, the type of surgery and how long she will be out of work. She would like to come and pick the letter up. Her cb# [phone]

## 2023-07-31 NOTE — Telephone Encounter (Signed)
 Note at front desk, patient aware

## 2023-08-06 ENCOUNTER — Telehealth: Payer: Self-pay | Admitting: Orthopaedic Surgery

## 2023-08-06 NOTE — Telephone Encounter (Signed)
 Pt is requesting a phone call. Best call back [phone]

## 2023-08-07 ENCOUNTER — Other Ambulatory Visit: Payer: Self-pay | Admitting: Physician Assistant

## 2023-08-07 DIAGNOSIS — Z01818 Encounter for other preprocedural examination: Secondary | ICD-10-CM

## 2023-08-08 ENCOUNTER — Telehealth: Payer: Self-pay | Admitting: Orthopaedic Surgery

## 2023-08-08 NOTE — Telephone Encounter (Signed)
 IC, spoke with patient advised form received. She will come by today to pay form fee and sign auth.

## 2023-08-08 NOTE — Telephone Encounter (Signed)
 Pt submitted medical release for, and 20.00 payment. FLMA forms faxed to [facility]

## 2023-08-08 NOTE — Telephone Encounter (Signed)
 Received

## 2023-08-11 ENCOUNTER — Telehealth: Payer: Self-pay | Admitting: Pharmacist

## 2023-08-11 NOTE — Telephone Encounter (Signed)
 Patient called clinical pharmacist to ask about Wegovy  causing eye problems. She states she has been experiencing discharge from her right eye and it also appears to be swollen.  She has not experienced any changes or loss of  vision and denies blurry vision.   There have been reports linking Ozempic / Wegovy  / semaglutide  to a rare but potentially serious eye condition called non-arteritic anterior ischemic optic neuropathy (NAION), which can lead to vision loss. This condition occurs when blood flow to the optic nerve is reduced, causing damage and potential blindness. While the link is still being investigated, studies suggest an increased risk of NAION in individuals taking semaglutide , particularly those with diabetes or obesity. Her symptoms do not sound like they are NAION.   She called her optometrist but they did not have any openings until August.   Currently first available appt with Dr Watt is Monday 6/30. Appointment made to see Dallas Maxwell, [facility] 08/12/2023 at 10:20am

## 2023-08-12 ENCOUNTER — Ambulatory Visit (INDEPENDENT_AMBULATORY_CARE_PROVIDER_SITE_OTHER): Admitting: Medical

## 2023-08-12 VITALS — BP 134/66 | HR 90 | Temp 98.0°F | Resp 18 | Ht 64.0 in | Wt 227.0 lb

## 2023-08-12 DIAGNOSIS — H109 Unspecified conjunctivitis: Secondary | ICD-10-CM

## 2023-08-12 MED ORDER — TOBRAMYCIN 0.3 % OP SOLN: 2.0000 [drp] | Freq: Four times a day (QID) | OPHTHALMIC | 0 refills | Status: AC

## 2023-08-12 NOTE — Patient Instructions (Signed)
 Conjunctivitis Symptoms suggest bacterial conjunctivitis due to sticky discharge and duration. Treatment initiated to address potential bacterial cause. - Prescribed Tobrex (tobramycin) eye drops. - Instructed to monitor for increased lid swelling, puffiness, or tenderness and report if these occur. - Advised follow-up in 5-7 days if symptoms persist or worsen. - Scheduled follow-up with Dr. Ubaldo or provider in 5-7 days, cancel if symptoms resolve. - Instructed to report new symptoms such as puffy eyelids, vision changes, or eye pain before follow-up.

## 2023-08-12 NOTE — Progress Notes (Signed)
 Subjective:    Patient ID: Stephanie Parks, female    DOB: [birth year], 78 y.o.   MRN: [id number]  HPI Stephanie Parks is a 78 year old female who presents with right eye discharge and swelling.  The right eye discharge began five days ago, accompanied by swelling. Initial application of eye drops helped reduce the swelling, but clear, sticky discharge persists, causing the eyelids to adhere, particularly in the morning. She uses a washcloth and hot water to clean the area.  Pt drops recenlty used refresh drops.   There is no involvement of the left eye, and she has not been in contact with anyone with similar symptoms. No family or friends have a history of eye discharge or conjunctivitis.  The swelling has decreased since the initial presentation, but she typically experiences some swelling under her right eye. She has a history of problems with her right eye.  Pt clarifies rt eye lower lid chronically mild little puffy. Even before recent events.  She experiences transient vision changes that improve after clearing the discharge, but otherwise denies significant vision changes. No pain in the right eye or surrounding areas. The discharge remains clear without any yellow or green coloration.   Review of Systems  Constitutional:  Negative for chills and fever.  HENT:  Negative for congestion, ear pain, mouth sores, postnasal drip and sinus pressure.   Eyes:  Positive for discharge and visual disturbance. Negative for photophobia, pain, redness and itching.       Only when matting presently. When cleans eye in morning mild blurred vision clear. No orbit pain.  Respiratory:  Negative for cough, chest tightness, shortness of breath and wheezing.   Cardiovascular:  Negative for chest pain and palpitations.  Gastrointestinal:  Negative for abdominal pain.  Musculoskeletal:  Negative for back pain.  Neurological:  Negative for dizziness, weakness and numbness.  Hematological:  Negative  for adenopathy.    Past Medical History:  Diagnosis Date   Achalasia 09/28/2012   Allergic rhinitis 03/01/2015   Anemia    Aneurysm (HCC) 03/01/2015   stable 5 mm periopthalmic right ICA aneurysm 03/25/21 CTA   BP (high blood pressure) 03/01/2015   Bronchitis 04/23/2018   Cephalalgia 08/24/2012   Overview:  ICD-10 cut over     Chest pain 04/04/2011   Coronary Ca Score 0, no significant CAD 08/20/18; CP with troponin 4>32 s/p LHC showing normal coronaries 12/29/18   CN (constipation) 09/28/2012   Coughing 04/24/2017   Decreased potassium in the blood 03/01/2015   Diverticulosis    Dizziness 03/01/2015   Erythropoietin  deficiency anemia 03/13/2021   GERD (gastroesophageal reflux disease)    Hiatal hernia    Hypercholesterolemia 03/01/2015   Hyperlipidemia    Hypertension    Ingrown toenail 08/10/2017   Inguinal hernia    Iron deficiency anemia 03/13/2021   Migraine headache    Onychomycosis 12/08/2017   Paresthesia of arm 03/01/2015   PE (pulmonary thromboembolism) (HCC) 03/13/2020   in setting of + COVID-19, s/p Eliquis  x 3 month   Schatzki's ring    Tendonitis, Achilles, right 12/08/2017   Unilateral primary osteoarthritis, left knee 10/07/2017     Social History   Socioeconomic History   Marital status: Divorced    Spouse name: Not on file   Number of children: 2   Years of education: Not on file   Highest education level: Some college, no degree  Occupational History   Occupation: Lobbyist: [id number]  Tobacco  Use   Smoking status: Former    Current packs/day: 0.00    Average packs/day: 0.5 packs/day for 20.0 years (10.0 ttl pk-yrs)    Types: Cigarettes    Start date: 02/19/1964    Quit date: 02/19/1984    Years since quitting: 39.5   Smokeless tobacco: Never  Vaping Use   Vaping status: Never Used  Substance and Sexual Activity   Alcohol  use: No   Drug use: No   Sexual activity: Yes    Partners: Male  Other Topics Concern   Not on file  Social  History Narrative   Lives alone   Caffeine use: Coffee one cup daily   Right handed    Son is her next of kin   Working at [employer]   Social Drivers of [employer] Strain: Low Risk  (08/11/2023)   Overall Financial Resource Strain (CARDIA)    Difficulty of Paying Living Expenses: Not hard at all  Food Insecurity: Food Insecurity Present (08/11/2023)   Hunger Vital Sign    Worried About Running Out of Food in the Last Year: Not on file    Ran Out of Food in the Last Year: Sometimes true  Transportation Needs: No Transportation Needs (08/11/2023)   PRAPARE - Administrator, Civil Service (Medical): No    Lack of Transportation (Non-Medical): No  Physical Activity: Inactive (08/11/2023)   Exercise Vital Sign    Days of Exercise per Week: 0 days    Minutes of Exercise per Session: Not on file  Stress: No Stress Concern Present (08/11/2023)   [employer] of Occupational Health - Occupational Stress Questionnaire    Feeling of Stress: Not at all  Social Connections: Moderately Integrated (08/11/2023)   Social Connection and Isolation Panel    Frequency of Communication with Friends and Family: More than three times a week    Frequency of Social Gatherings with Friends and Family: Once a week    Attends Religious Services: More than 4 times per year    Active Member of Clubs or Organizations: Yes    Attends Banker Meetings: 1 to 4 times per year    Marital Status: Divorced  Intimate Partner Violence: Not At Risk (03/25/2023)   Humiliation, Afraid, Rape, and Kick questionnaire    Fear of Current or Ex-Partner: No    Emotionally Abused: No    Physically Abused: No    Sexually Abused: No    Past Surgical History:  Procedure Laterality Date   ABDOMINAL HYSTERECTOMY     BACK SURGERY     x 3   CARDIAC CATHETERIZATION  12/29/2018   angiographically normal coronary arteries 12/29/18 ([place])   CARPAL TUNNEL RELEASE     left wrist    CERVICAL SPINE SURGERY     HEMORRHOID SURGERY     TOTAL HIP ARTHROPLASTY Left 09/04/2021   Procedure: LEFT TOTAL HIP ARTHROPLASTY ANTERIOR APPROACH;  Surgeon: Vernetta Lonni GRADE, MD;  Location: [facility] OR;  Service: Orthopedics;  Laterality: Left;    Family History  Problem Relation Age of Onset   Allergic rhinitis Sister    Diabetes Sister    Hypertension Sister    Heart attack Father 58   Heart disease Father    Stomach cancer Maternal Grandmother    Heart disease Mother    Colon cancer Neg Hx    Angioedema Neg Hx    Asthma Neg Hx    Eczema Neg Hx    Urticaria Neg  Hx    Immunodeficiency Neg Hx    Breast cancer Neg Hx    Migraines Neg Hx    Headache Neg Hx     Allergies  Allergen Reactions   Methylprednisolone  Other (See Comments)    Lightheaded and Hallucinations   Shellfish Allergy Hives and Swelling   Ace Inhibitors Other (See Comments)    Pt cannot recall her reaction but tolerates arb    Gabapentin  Other (See Comments)    headaches   Pitavastatin     Unknown reaction  Other Reaction(s): Other (See Comments)  ADVERSE REACTION,  Pt unaware of this reaction; states she has been on crestor  in the past with no issues   Sulfa Antibiotics Other (See Comments) and Rash    Fine bumps   Topiramate  Palpitations    Heart race,     Current Outpatient Medications on File Prior to Visit  Medication Sig Dispense Refill   albuterol  (VENTOLIN  HFA) 108 (90 Base) MCG/ACT inhaler Inhale 1-2 puffs into the lungs every 6 (six) hours as needed for wheezing or shortness of breath. 8 g 0   apixaban  (ELIQUIS ) 5 MG TABS tablet Take 1 tablet (5 mg total) by mouth 2 (two) times daily. 60 tablet 0   Calcium  Carb-Cholecalciferol (CALCIUM  600 + D PO) Take 1 tablet by mouth daily.     carboxymethylcellulose (REFRESH PLUS) 0.5 % SOLN Place 1 drop into both eyes 3 (three) times daily as needed (dry eyes).     carvedilol  (COREG ) 6.25 MG tablet Take 1 tablet (6.25 mg total) by mouth 2 (two)  times daily with a meal. 180 tablet 1   Cyanocobalamin  (B-12 PO) Take 2 tablets by mouth daily.     ELDERBERRY PO Take 1 capsule by mouth daily.     EPINEPHrine  (EPIPEN  2-PAK) 0.3 mg/0.3 mL IJ SOAJ injection Inject 0.3 mg into the muscle as needed for anaphylaxis. Per allergen immunotherapy protocol 1 each 0   fluticasone  (FLONASE ) 50 MCG/ACT nasal spray Place 2 sprays into both nostrils daily as needed for allergies or rhinitis. 48 g 1   linaclotide  (LINZESS ) 145 MCG CAPS capsule Take 145 mcg by mouth.     losartan -hydrochlorothiazide  (HYZAAR ) 50-12.5 MG tablet Take 0.5 tablets by mouth daily. 45 tablet 3   Multiple Vitamins-Minerals (ALIVE MULTI-VITAMIN PO) Take 1 tablet by mouth daily.     NON FORMULARY Pt uses a cpap nightly     nortriptyline  (PAMELOR ) 25 MG capsule Take 2 capsules (50 mg total) by mouth at bedtime. 180 capsule 1   omeprazole  (PRILOSEC) 40 MG capsule Take 1 capsule (40 mg total) by mouth daily. 90 capsule 3   polyethylene glycol (MIRALAX ) 17 g packet Take 17 g by mouth daily as needed for mild constipation or moderate constipation. 90 packet 2   potassium chloride  SA (KLOR-CON  M20) 20 MEQ tablet Take 2 tablets (40 mEq total) by mouth daily. 180 tablet 1   pregabalin  (LYRICA ) 75 MG capsule TAKE 1 CAPSULE BY MOUTH TWICE A DAY 60 capsule 0   rosuvastatin  (CRESTOR ) 20 MG tablet Take 1 tablet (20 mg total) by mouth daily. 90 tablet 3   Semaglutide -Weight Management (WEGOVY ) 0.5 MG/0.5ML SOAJ Inject 0.5 mg into the skin once a week. 2 mL 1   spironolactone  (ALDACTONE ) 25 MG tablet Take 0.5 tablets (12.5 mg total) by mouth daily. 45 tablet 3   traMADol  (ULTRAM ) 50 MG tablet Take 1 tablet (50 mg total) by mouth every 8 (eight) hours as needed. 60 tablet 0  No current facility-administered medications on file prior to visit.    BP 134/66   Pulse 90   Temp 98 F (36.7 C)   Resp 18   Ht 5' 4 (1.626 m)   Wt 227 lb (103 kg)   SpO2 94%   BMI 38.96 kg/m        Objective:    Physical Exam  General- No acute distress. Pleasant patient. Neck- Full range of motion, no jvd Lungs- Clear, even and unlabored. Heart- regular rate and rhythm. Neurologic- CNII- XII grossly intact.  Eyes- peerl bilaterally. Conjunctiva clear both sides. No obvious swelling of lids on either side presently. No tenderns of upper or lower eye ids. Heent- no sinus presssure to palpation.       Assessment & Plan:   Patient Instructions  Conjunctivitis Symptoms suggest bacterial conjunctivitis due to sticky discharge and duration. Treatment initiated to address potential bacterial cause. - Prescribed Tobrex (tobramycin) eye drops. - Instructed to monitor for increased lid swelling, puffiness, or tenderness and report if these occur. - Advised follow-up in 5-7 days if symptoms persist or worsen. - Scheduled follow-up with Dr. Ubaldo or provider in 5-7 days, cancel if symptoms resolve. - Instructed to report new symptoms such as puffy eyelids, vision changes, or eye pain before follow-up.

## 2023-08-14 ENCOUNTER — Other Ambulatory Visit: Payer: Self-pay | Admitting: Pharmacist

## 2023-08-14 NOTE — Progress Notes (Signed)
   08/14/2023 Name: Stephanie Parks MRN: [id number] DOB: 05-01-1945  Chief Complaint  Patient presents with   Medication Management    Stephanie Parks is a 78 y.o. year old female who presented for a telephone visit.   They were referred to the pharmacist by their PCP for assistance in managing medication access.    Subjective:  Patient was referred to Clinical Pharmacist Practitioner to assist with initiation of Wegovy . Started Wergovy 2 weeks ago at 0.25mg  weekly. She reports she is tolerating well. Has noticed a decrease in appetite.  Prior to starting Wegovy  she occasionally experienced gas and bloating - reports frequency and severity have not changed since starting Wegovy .  Prior authorization for Wegovy  was approved thru 02/18/2024. Cost of Wegovy  will be $47 per month.   BMI of 39 Height = 5' 4  Wt Readings from Last 3 Encounters:  08/12/23 227 lb (103 kg)  07/02/23 228 lb (103.4 kg)  06/25/23 227 lb 12.8 oz (103.3 kg)    BP Readings from Last 3 Encounters:  08/12/23 134/66  06/25/23 130/60  06/22/23 (!) 154/76   Exercise - Patient has not been able to exercise due to other conditions - chronic back and knee pain; still working part time and gets some exercise / stands and walks on her job.  Diet - Reviewed at last visit Finds it hard to not eat at night but has been able to resist eating late a little better since starting Wegovy  She craves sweets - she feels this is her major problem Eats 2 meals per day and a snack at night  Meals - fried fish, baked chicken and [place]. Little pork or beef.  Vegetables - lima beans, green beans, greens, cabbage, squash Snack - bowl of cereal with milk, ice cream (about 2 times per month)  Unable to eat nuts due to difficulty digesting nuts.   Objective:  Lab Results  Component Value Date   HGBA1C 6.1 (H) 06/08/2022    Lab Results  Component Value Date   CREATININE 0.95 06/25/2023   BUN 20 06/25/2023   NA 142  06/25/2023   K 3.0 (L) 06/25/2023   CL 103 06/25/2023   CO2 30 06/25/2023    Lab Results  Component Value Date   CHOL 180 06/08/2022   HDL 67 06/08/2022   LDLCALC 101 (H) 06/08/2022   TRIG 62 06/08/2022   CHOLHDL 2.7 06/08/2022      Assessment/Plan:  History of TIA / CAD - secondary prevention of ASCVD + obesity class 2:  - Continue Wegovy  0.25mg  for 2 more doses, then increase to 0.5mg  weekly   - continue to limit intake of sugar, fried foods.  - Increase physical activity - goal is 150 minutes of physical   Follow up 4 weeks    Madelin Ray, PharmD Clinical Pharmacist [facility]

## 2023-08-18 NOTE — Pre-Procedure Instructions (Signed)
 Surgical Instructions   Your procedure is scheduled on Tuesday, July 8th. Report to [facility] Main Entrance A at 09:50 A.M., then check in with the Admitting office. Any questions or running late day of surgery: call [phone]  Questions prior to your surgery date: call [phone], Monday-Friday, 8am-4pm. If you experience any cold or flu symptoms such as cough, fever, chills, shortness of breath, etc. between now and your scheduled surgery, please notify us  at the above number.     Remember:  Do not eat after midnight the night before your surgery  You may drink clear liquids until 08:50 AM the morning of your surgery.   Clear liquids allowed are: Water, Non-Citrus Juices (without pulp), Carbonated Beverages, Clear Tea (no milk, honey, etc.), Black Coffee Only (NO MILK, CREAM OR POWDERED CREAMER of any kind), and Gatorade.  Patient Instructions  The night before surgery:  No food after midnight. ONLY clear liquids after midnight  The day of surgery (if you do NOT have diabetes):  Drink ONE (1) Pre-Surgery Clear Ensure by 08:50 AM the morning of surgery. Drink in one sitting. Do not sip.  This drink was given to you during your hospital  pre-op appointment visit.  Nothing else to drink after completing the  Pre-Surgery Clear Ensure.         If you have questions, please contact your surgeon's office.    Take these medicines the morning of surgery with A SIP OF WATER  carvedilol  (COREG )  omeprazole  (PRILOSEC)  rosuvastatin  (CRESTOR )  tobramycin  (TOBREX ) eye drops   May take these medicines IF NEEDED: albuterol  (VENTOLIN  HFA)- bring inhaler with you on day of surgery carboxymethylcellulose (REFRESH PLUS)  EPINEPHrine  (EPIPEN  2-PAK)  fluticasone  (FLONASE )    STOP taking Semaglutide -Weight Management (WEGOVY ) 7 days prior to surgery. Last dose 6/30.  STOP taking apixaban  (ELIQUIS ) 3 days before surgery. Last dose 7/4.  One week prior to surgery, STOP taking any  Aspirin  (unless otherwise instructed by your surgeon) Aleve, Naproxen, Ibuprofen , Motrin , Advil , Goody's, BC's, all herbal medications, fish oil, and non-prescription vitamins.                     Do NOT Smoke (Tobacco/Vaping) for 24 hours prior to your procedure.  If you use a CPAP at night, you may bring your mask/headgear for your overnight stay.   You will be asked to remove any contacts, glasses, piercing's, hearing aid's, dentures/partials prior to surgery. Please bring cases for these items if needed.    Patients discharged the day of surgery will not be allowed to drive home, and someone needs to stay with them for 24 hours.  SURGICAL WAITING ROOM VISITATION Patients may have no more than 2 support people in the waiting area - these visitors may rotate.   Pre-op nurse will coordinate an appropriate time for 1 ADULT support person, who may not rotate, to accompany patient in pre-op.  Children under the age of 94 must have an adult with them who is not the patient and must remain in the main waiting area with an adult.  If the patient needs to stay at the hospital during part of their recovery, the visitor guidelines for inpatient rooms apply.  Please refer to the [facility] website for the visitor guidelines for any additional information.   If you received a COVID test during your pre-op visit  it is requested that you wear a mask when out in public, stay away from anyone that may not be  feeling well and notify your surgeon if you develop symptoms. If you have been in contact with anyone that has tested positive in the last 10 days please notify you surgeon.      Pre-operative 5 CHG Bathing Instructions   You can play a key role in reducing the risk of infection after surgery. Your skin needs to be as free of germs as possible. You can reduce the number of germs on your skin by washing with CHG (chlorhexidine  gluconate) soap before surgery. CHG is an antiseptic soap that kills  germs and continues to kill germs even after washing.   DO NOT use if you have an allergy to chlorhexidine /CHG or antibacterial soaps. If your skin becomes reddened or irritated, stop using the CHG and notify one of our RNs at [phone].   Please shower with the CHG soap starting 4 days before surgery using the following schedule:     Please keep in mind the following:  DO NOT shave, including legs and underarms, starting the day of your first shower.   You may shave your face at any point before/day of surgery.  Place clean sheets on your bed the day you start using CHG soap. Use a clean washcloth (not used since being washed) for each shower. DO NOT sleep with pets once you start using the CHG.   CHG Shower Instructions:  Wash your face and private area with normal soap. If you choose to wash your hair, wash first with your normal shampoo.  After you use shampoo/soap, rinse your hair and body thoroughly to remove shampoo/soap residue.  Turn the water OFF and apply about 3 tablespoons (45 ml) of CHG soap to a CLEAN washcloth.  Apply CHG soap ONLY FROM YOUR NECK DOWN TO YOUR TOES (washing for 3-5 minutes)  DO NOT use CHG soap on face, private areas, open wounds, or sores.  Pay special attention to the area where your surgery is being performed.  If you are having back surgery, having someone wash your back for you may be helpful. Wait 2 minutes after CHG soap is applied, then you may rinse off the CHG soap.  Pat dry with a clean towel  Put on clean clothes/pajamas   If you choose to wear lotion, please use ONLY the CHG-compatible lotions that are listed below.  Additional instructions for the day of surgery: DO NOT APPLY any lotions, deodorants, cologne, or perfumes.   Do not bring valuables to the hospital. [facility] is not responsible for any belongings/valuables. Do not wear nail polish, gel polish, artificial nails, or any other type of covering on natural nails (fingers and  toes) Do not wear jewelry or makeup Put on clean/comfortable clothes.  Please brush your teeth.  Ask your nurse before applying any prescription medications to the skin.     CHG Compatible Lotions   Aveeno Moisturizing lotion  Cetaphil Moisturizing Cream  Cetaphil Moisturizing Lotion  Clairol Herbal Essence Moisturizing Lotion, Dry Skin  Clairol Herbal Essence Moisturizing Lotion, Extra Dry Skin  Clairol Herbal Essence Moisturizing Lotion, Normal Skin  Curel Age Defying Therapeutic Moisturizing Lotion with Alpha Hydroxy  Curel Extreme Care Body Lotion  Curel Soothing Hands Moisturizing Hand Lotion  Curel Therapeutic Moisturizing Cream, Fragrance-Free  Curel Therapeutic Moisturizing Lotion, Fragrance-Free  Curel Therapeutic Moisturizing Lotion, Original Formula  Eucerin Daily Replenishing Lotion  Eucerin Dry Skin Therapy Plus Alpha Hydroxy Crme  Eucerin Dry Skin Therapy Plus Alpha Hydroxy Lotion  Eucerin Original Crme  Eucerin Original Lotion  Eucerin Plus Crme Eucerin Plus Lotion  Eucerin TriLipid Replenishing Lotion  Keri Anti-Bacterial Hand Lotion  Keri Deep Conditioning Original Lotion Dry Skin Formula Softly Scented  Keri Deep Conditioning Original Lotion, Fragrance Free Sensitive Skin Formula  Keri Lotion Fast Absorbing Fragrance Free Sensitive Skin Formula  Keri Lotion Fast Absorbing Softly Scented Dry Skin Formula  Keri Original Lotion  Keri Skin Renewal Lotion Keri Silky Smooth Lotion  Keri Silky Smooth Sensitive Skin Lotion  Nivea Body Creamy Conditioning Oil  Nivea Body Extra Enriched Lotion  Nivea Body Original Lotion  Nivea Body Sheer Moisturizing Lotion Nivea Crme  Nivea Skin Firming Lotion  NutraDerm 30 Skin Lotion  NutraDerm Skin Lotion  NutraDerm Therapeutic Skin Cream  NutraDerm Therapeutic Skin Lotion  ProShield Protective Hand Cream  Provon moisturizing lotion  Please read over the following fact sheets that you were given.

## 2023-08-19 ENCOUNTER — Encounter (HOSPITAL_COMMUNITY): Payer: Self-pay

## 2023-08-19 ENCOUNTER — Other Ambulatory Visit: Payer: Self-pay

## 2023-08-19 ENCOUNTER — Encounter (HOSPITAL_COMMUNITY)
Admission: RE | Admit: 2023-08-19 | Discharge: 2023-08-19 | Disposition: A | Discharge status: Home / Self Care | Source: Ambulatory Visit | Attending: Orthopaedic Surgery | Admitting: Orthopaedic Surgery

## 2023-08-19 VITALS — BP 130/65 | HR 101 | Temp 97.7°F | Resp 17 | Ht 65.0 in | Wt 226.9 lb

## 2023-08-19 DIAGNOSIS — K219 Gastro-esophageal reflux disease without esophagitis: Secondary | ICD-10-CM | POA: Insufficient documentation

## 2023-08-19 DIAGNOSIS — M1611 Unilateral primary osteoarthritis, right hip: Secondary | ICD-10-CM | POA: Diagnosis not present

## 2023-08-19 DIAGNOSIS — Z01812 Encounter for preprocedural laboratory examination: Secondary | ICD-10-CM | POA: Insufficient documentation

## 2023-08-19 DIAGNOSIS — I10 Essential (primary) hypertension: Secondary | ICD-10-CM | POA: Insufficient documentation

## 2023-08-19 DIAGNOSIS — Z8616 Personal history of COVID-19: Secondary | ICD-10-CM | POA: Insufficient documentation

## 2023-08-19 DIAGNOSIS — D649 Anemia, unspecified: Secondary | ICD-10-CM | POA: Insufficient documentation

## 2023-08-19 DIAGNOSIS — E669 Obesity, unspecified: Secondary | ICD-10-CM | POA: Insufficient documentation

## 2023-08-19 DIAGNOSIS — Z86711 Personal history of pulmonary embolism: Secondary | ICD-10-CM | POA: Diagnosis not present

## 2023-08-19 DIAGNOSIS — I671 Cerebral aneurysm, nonruptured: Secondary | ICD-10-CM | POA: Diagnosis not present

## 2023-08-19 DIAGNOSIS — K449 Diaphragmatic hernia without obstruction or gangrene: Secondary | ICD-10-CM | POA: Diagnosis not present

## 2023-08-19 DIAGNOSIS — Z79899 Other long term (current) drug therapy: Secondary | ICD-10-CM | POA: Insufficient documentation

## 2023-08-19 DIAGNOSIS — E7800 Pure hypercholesterolemia, unspecified: Secondary | ICD-10-CM | POA: Insufficient documentation

## 2023-08-19 DIAGNOSIS — G4733 Obstructive sleep apnea (adult) (pediatric): Secondary | ICD-10-CM | POA: Insufficient documentation

## 2023-08-19 DIAGNOSIS — K220 Achalasia of cardia: Secondary | ICD-10-CM | POA: Diagnosis not present

## 2023-08-19 DIAGNOSIS — Z01818 Encounter for other preprocedural examination: Secondary | ICD-10-CM

## 2023-08-19 DIAGNOSIS — Z7901 Long term (current) use of anticoagulants: Secondary | ICD-10-CM | POA: Diagnosis not present

## 2023-08-19 DIAGNOSIS — R0789 Other chest pain: Secondary | ICD-10-CM | POA: Diagnosis not present

## 2023-08-19 DIAGNOSIS — Z6837 Body mass index (BMI) 37.0-37.9, adult: Secondary | ICD-10-CM | POA: Diagnosis not present

## 2023-08-19 HISTORY — DX: Sleep apnea, unspecified: G47.30

## 2023-08-19 HISTORY — DX: Atherosclerotic heart disease of native coronary artery without angina pectoris: I25.10

## 2023-08-19 HISTORY — DX: Transient cerebral ischemic attack, unspecified: G45.9

## 2023-08-19 LAB — CBC WITH DIFFERENTIAL/PLATELET
Abs Immature Granulocytes: 0.02 10*3/uL (ref 0.00–0.07)
Basophils Absolute: 0 10*3/uL (ref 0.0–0.1)
Basophils Relative: 0 %
Eosinophils Absolute: 0.4 10*3/uL (ref 0.0–0.5)
Eosinophils Relative: 6 %
HCT: 35 % — ABNORMAL LOW (ref 36.0–46.0)
Hemoglobin: 11.1 g/dL — ABNORMAL LOW (ref 12.0–15.0)
Immature Granulocytes: 0 %
Lymphocytes Relative: 35 %
Lymphs Abs: 2.4 10*3/uL (ref 0.7–4.0)
MCH: 29.1 pg (ref 26.0–34.0)
MCHC: 31.7 g/dL (ref 30.0–36.0)
MCV: 91.9 fL (ref 80.0–100.0)
Monocytes Absolute: 0.5 10*3/uL (ref 0.1–1.0)
Monocytes Relative: 8 %
Neutro Abs: 3.4 10*3/uL (ref 1.7–7.7)
Neutrophils Relative %: 51 %
Platelets: 288 10*3/uL (ref 150–400)
RBC: 3.81 MIL/uL — ABNORMAL LOW (ref 3.87–5.11)
RDW: 15.3 % (ref 11.5–15.5)
WBC: 6.8 10*3/uL (ref 4.0–10.5)
nRBC: 0 % (ref 0.0–0.2)

## 2023-08-19 LAB — TYPE AND SCREEN
ABO/RH(D): A POS
Antibody Screen: NEGATIVE

## 2023-08-19 LAB — SURGICAL PCR SCREEN
MRSA, PCR: NEGATIVE
Staphylococcus aureus: NEGATIVE

## 2023-08-19 LAB — BASIC METABOLIC PANEL WITH GFR
Anion gap: 8 (ref 5–15)
BUN: 15 mg/dL (ref 8–23)
CO2: 30 mmol/L (ref 22–32)
Calcium: 9.2 mg/dL (ref 8.9–10.3)
Chloride: 103 mmol/L (ref 98–111)
Creatinine, Ser: 0.92 mg/dL (ref 0.44–1.00)
GFR, Estimated: >60 mL/min (ref >60)
Glucose, Bld: 101 mg/dL — ABNORMAL HIGH (ref 70–99)
Potassium: 3.8 mmol/L (ref 3.5–5.1)
Sodium: 141 mmol/L (ref 135–145)

## 2023-08-19 NOTE — Progress Notes (Signed)
 PCP - Dr. Harlene Copland Cardiologist - Dr. Zelphia Fruits  PPM/ICD - denies   Chest x-ray - 06/22/23 EKG - 06/22/23 Stress Test - 04/16/23- [facility] ECHO - 06/09/22 Cardiac Cath - 12/29/18- [facility]  Sleep Study - OSA+ CPAP - nightly, pt unsure of pressure settings  DM- denies, takes Wegovy  for weight loss  Last dose of GLP1 agonist-  08/13/23 GLP1 instructions: Hold 7 days prior to surgery  Blood Thinner Instructions: Hold Eliquis  3 days. Last dose 7/4 Aspirin  Instructions: n/a  ERAS Protcol - clears until 0850 PRE-SURGERY Ensure given  COVID TEST- n/a   Anesthesia review: yes, cardiac hx  Patient denies shortness of breath, fever, cough and chest pain at PAT appointment   All instructions explained to the patient, with a verbal understanding of the material. Patient agrees to go over the instructions while at home for a better understanding.  The opportunity to ask questions was provided.

## 2023-08-20 ENCOUNTER — Ambulatory Visit (INDEPENDENT_AMBULATORY_CARE_PROVIDER_SITE_OTHER): Admitting: Podiatry

## 2023-08-20 ENCOUNTER — Ambulatory Visit: Admitting: Podiatry

## 2023-08-20 ENCOUNTER — Encounter: Payer: Self-pay | Admitting: Podiatry

## 2023-08-20 VITALS — Ht 65.0 in | Wt 226.9 lb

## 2023-08-20 DIAGNOSIS — K5904 Chronic idiopathic constipation: Secondary | ICD-10-CM | POA: Diagnosis not present

## 2023-08-20 DIAGNOSIS — B351 Tinea unguium: Secondary | ICD-10-CM | POA: Diagnosis not present

## 2023-08-20 NOTE — Progress Notes (Signed)
 [address] [place] [place] [id number] [phone]  Follow-up   Subjective   Patient ID:  Stephanie Parks is a 78 y.o. (DOB 1945/11/06) female.   CC:     Patient presents with  . Constipation    About the same  . Bloated     HPI:  Stephanie Parks is a 78 y.o. female with PMHx of PE on AC, CAD, HLD, HTN, obesity, migraines, constipation who presents today for follow-up of constipation.   Patient was initially seen by Dr. Fransisca October 2024 as a new patient for evaluation of constipation for many years.  She was previously established with digestive health services for similar symptoms.  She has tried over-the-counter laxatives with minimal relief including MiraLAX  and stool softeners.  She was given Linzess  at 1 point but this caused diarrhea.  She continued to complain of constipation, early satiety and bloating secondary to her symptoms but denied any nausea or vomiting or overt bleeding.  Reported having a colonoscopy 3 years prior but was told she did not need any further screenings.  She was given Trulance 3 mg daily but this was too expensive.  She was sent in Amitiza  24 mcg twice daily [facility] which was more affordable but stopped the medication due to nausea.  Patient reports she would like to retry Linzess .  I recommended a full MiraLAX  bowel purge followed by Linzess  145 mcg every day thereafter.  This was subsequently increased to 290 mcg daily back in April 2025.  Today, patient reports that she had minimal relief of her constipation with Linzess  at 145 mcg daily.  When she increased to 290 mcg daily, she had fecal incontinence while at work and had to leave work early.  She has not taken Linzess  since.  She still continues to have bloating.  She takes milk of magnesia daily for constipation with only some relief.  Reports she drinks about 4-5 bottles of water a day.  Review of Systems:  Except as stated in the HPI, all other systems reviewed and  are negative.   Past Medical History:  Diagnosis Date  . Aneurysm   . GERD (gastroesophageal reflux disease)   . Headache(784.0)   . Hypertension     Past Surgical History:  Procedure Laterality Date  . Colonoscopy     unknown  . Hysterectomy    . L5-s1 disc herniation    . Spine surgery    . Upper gastrointestinal endoscopy     unknown    Family History  Problem Relation Age of Onset  . Heart disease Mother   . Heart disease Father   . Diabetes Sister   . Cancer Maternal Grandmother   . Colon polyps Son   . Colon cancer Neg Hx     Social History   Socioeconomic History  . Marital status: Divorced  Tobacco Use  . Smoking status: Former    Current packs/day: 0.25    Average packs/day: 0.3 packs/day for 15.0 years (3.8 ttl pk-yrs)    Types: Cigarettes  . Smokeless tobacco: Never  Vaping Use  . Vaping status: Never Used  Substance and Sexual Activity  . Alcohol  use: No  . Drug use: No    Medications: Outpatient Medications Marked as Taking for the 08/20/23 encounter (Office Visit) with Charmaine MARLA Fern, PA-C  Medication Sig Dispense Refill  . acetaminophen  (TYLENOL ) 325 mg tablet Take two tablets (650 mg dose) by mouth every 6 (six) hours as needed  for Pain.    . calcium  carbonate (OS-CAL) 600 MG TABS Take one tablet (600 mg dose) by mouth 2 (two) times daily with meals.    . carboxymethylcellulose (REFRESH PLUS) 0.5% SOLN Apply one drop to eye.    . carvedilol  (COREG ) 6.25 mg tablet Take one tablet (6.25 mg dose) by mouth 2 (two) times a day with meals.    . Cyanocobalamin  (VITAMIN B-12 PO) Take 1,000 mcg by mouth daily. GUMMIES    . fluticasone  propionate (FLONASE ) 50 mcg/actuation nasal spray two sprays by Nasal route daily as needed.    . [DISCONTINUED] linaclotide  (LINZESS ) 145 mcg capsule Take one capsule (145 mcg dose) by mouth daily. 30 capsule 11  . losartan -hydrochlorothiazide  (HYZAAR ) 50-12.5 mg per tablet Take one half tablet by mouth daily.    .  [DISCONTINUED] lovastatin  (MEVACOR ) 20 mg tablet Take by mouth.    . Multiple Vitamin (MULTIVITAMIN) tablet Take one tablet by mouth daily.    . Multiple Vitamins-Minerals (ALIVE ONCE DAILY WOMENS 50+ PO)     . NON FORMULARY Pt uses a cpap nightly    . nortriptyline  HCl (PAMELOR ) 25 mg capsule Take two capsules (50 mg dose) by mouth at bedtime.    . omeprazole  (PRILOSEC) 40 mg capsule Take one capsule (40 mg dose) by mouth daily. Once daily    . polyethylene glycol (MIRALAX ) 17 g packet Take seventeen g by mouth daily as needed.    . potassium chloride  (K-DUR,KLOR-CON ) 20 mEq CR tablet Take one tablet (20 mEq dose) by mouth 2 (two) times daily. One tab twice daily    . [DISCONTINUED] rosuvastatin  calcium  (CRESTOR ) 10 mg tablet Take one tablet (10 mg dose) by mouth daily.    . rosuvastatin  calcium  (CRESTOR ) 20 mg tablet Take one tablet (20 mg dose) by mouth daily.    . semaglutide -weight management (WEGOVY ) 0.5 mg/0.5 mL SOAJ injection Inject 0.5 mLs (0.5 mg dose) into the skin once a week at 0900.    . spironolactone  (ALDACTONE ) 25 mg tablet Take one half tablet (12.5 mg dose) by mouth daily.    . traMADol  (ULTRAM ) 50 mg tablet Take one tablet (50 mg dose) by mouth every 8 (eight) hours as needed.       Allergies  Allergen Reactions  . Gabapentin  Itching and Other    headaches  headaches    Other Reaction(s): Other (See Comments)  . Topiramate  Palpitations    Heart race,   Heart race,  . Ace Inhibitors Other    Pt cannot recall her reaction but tolerates arb   Other Reaction(s): Other (See Comments)    Pt cannot recall her reaction but tolerates arb  . Iodine  Hives  . Methylprednisolone  Other    Lightheaded and Hallucinations  . Other Hives  . Pitavastatin Other    Other reaction(s): Other (See Comments)  ADVERSE REACTION  ADVERSE REACTION   Pt unaware of this reaction; states she has been on crestor  in the past with no issues  Unknown reaction    Other Reaction(s):  Other (See Comments)    ADVERSE REACTION,  Pt unaware of this reaction; states she has been on crestor  in the past with no issues  ADVERSE REACTION,  Pt unaware of this reaction; states she has been on crestor  in the past with no issues    Unknown reaction  Other Reaction(s): Other (See Comments)  ADVERSE REACTION,  Pt unaware of this reaction; states she has been on crestor  in the past with no issues  ADVERSE REACTION  Pt unaware of this reaction; states she has been on crestor  in the past with no issues    Other reaction(s): Other (See Comments) ADVERSE REACTION  . Shellfish Allergy Hives, Other and Swelling    Hives  . Sulfa Drugs Cross Reactors Other and Rash    Fine bumps     Objective   BP 111/67 (BP Location: Left Upper Arm, Patient Position: Sitting)   Pulse 94   Temp 97.4 F (36.3 C) (Temporal)   Ht 5' 6 (1.676 m)   Wt 227 lb (103 kg)   BMI 36.64 kg/m     Physical Exam Vitals and nursing note reviewed.  Constitutional:      Appearance: Normal appearance. She is obese.  HENT:     Head: Normocephalic and atraumatic.  Eyes:     Conjunctiva/sclera: Conjunctivae normal.  Cardiovascular:     Rate and Rhythm: Normal rate and regular rhythm.     Pulses: Normal pulses.     Heart sounds: Normal heart sounds.  Pulmonary:     Effort: Pulmonary effort is normal.     Breath sounds: Normal breath sounds.  Abdominal:     General: Bowel sounds are normal.     Palpations: Abdomen is soft.     Tenderness: There is no abdominal tenderness.  Skin:    General: Skin is warm and dry.  Neurological:     General: No focal deficit present.     Mental Status: She is alert and oriented to person, place, and time.  Psychiatric:        Mood and Affect: Mood normal.        Behavior: Behavior normal.        Thought Content: Thought content normal.       Assessment   1. Chronic idiopathic constipation      Plan  Patient is a 78 yo female with PMHx of PE on AC, CAD, HLD,  HTN, obesity, migraines, constipation who presents today for follow-up of constipation.   Chronic idiopathic constipation She has tried over-the-counter laxatives with minimal relief including MiraLAX  and stool softeners.  She was given Linzess  at 1 point but this caused diarrhea.  She was given Trulance 3 mg daily but this was too expensive.  She was sent in Amitiza  24 mcg twice daily [facility] which was more affordable.  Patient reports that she would like to retrial Linzess  since this was effective and affordable back then.  She was told to perform a full MiraLAX  bowel purge then begin Linzess  145 mcg every day thereafter but had minimal relief.  She increased to 290 mcg daily which caused fecal seepage and incontinence.  She stopped Linzess .  We discussed trialing Motegrity next then considering Ibsrela.   Patient voiced understanding and agreed to the plan. Patient was encouraged to reach out with any new or worsening symptoms or if symptoms do not improve after initiating therapy.   No orders of the defined types were placed in this encounter.  Discontinued Medications   LINACLOTIDE  (LINZESS ) 145 MCG CAPSULE    Take one capsule (145 mcg dose) by mouth daily.   LOVASTATIN  (MEVACOR ) 20 MG TABLET    Take by mouth.   ROSUVASTATIN  CALCIUM  (CRESTOR ) 10 MG TABLET    Take one tablet (10 mg dose) by mouth daily.    Modified Medications   No medications on file   New Prescriptions   PRUCALOPRIDE SUCCINATE (MOTEGRITY) 1 MG TABS TABLET  Take one tablet (1 mg dose) by mouth daily.   Follow up in about 8 weeks (around 10/15/2023) for follow-up. Patient Instructions  Start Motegrity (prucalopride) 1 mg every morning for constipation.  Please give this medication at least 4 weeks to see how your bowels respond.  Please keep me updated with how you are doing.       Electronically Signed:  Charmaine MARLA Fern, PA-C 08/20/2023 1:06 PM

## 2023-08-20 NOTE — Anesthesia Preprocedure Evaluation (Addendum)
 Anesthesia Evaluation  Patient identified by MRN, date of birth, ID band Patient awake    Reviewed: Allergy & Precautions, NPO status , Patient's Chart, lab work & pertinent test results, reviewed documented beta blocker date and time   Airway Mallampati: I  TM Distance: >3 FB     Dental  (+) Upper Dentures, Missing, Dental Advisory Given,    Pulmonary sleep apnea and Continuous Positive Airway Pressure Ventilation , pneumonia, resolved, former smoker, PE Hx/o multiple subsegmental PE's   Pulmonary exam normal breath sounds clear to auscultation       Cardiovascular hypertension, Pt. on medications and Pt. on home beta blockers + CAD  Normal cardiovascular exam Rhythm:Regular Rate:Normal  Echo 06/09/22 1. Left ventricular ejection fraction, by estimation, is 60 to 65%. The  left ventricle has normal function. The left ventricle has no regional  wall motion abnormalities. There is mild left ventricular hypertrophy.  Indeterminate diastolic filling due to  E-A fusion.   2. Right ventricular systolic function is normal. The right ventricular  size is normal. There is normal pulmonary artery systolic pressure. The  estimated right ventricular systolic pressure is 33.0 mmHg.   3. The mitral valve is normal in structure. No evidence of mitral valve  regurgitation. No evidence of mitral stenosis.   4. The aortic valve is tricuspid. There is mild thickening of the aortic  valve. Aortic valve regurgitation is not visualized. No aortic stenosis is  present.   5. The inferior vena cava is normal in size with greater than 50%    EKG 06/24/23 NSR, cannot r/o anterior MI, poor R wave progression V leads   Neuro/Psych  Headaches TIA negative psych ROS   GI/Hepatic Neg liver ROS, hiatal hernia,GERD  Medicated,,  Endo/Other  Obesity HLD GLP-1 RA therapy- last dose 6/25  Renal/GU Renal disease  negative genitourinary    Musculoskeletal  (+) Arthritis , Osteoarthritis,  OA right hip   Abdominal  (+) + obese  Peds  Hematology  (+) Blood dyscrasia, anemia Eliquis  therapy- last dose 7/4   Anesthesia Other Findings   Reproductive/Obstetrics                              Anesthesia Physical Anesthesia Plan  ASA: 3  Anesthesia Plan: Spinal   Post-op Pain Management: Dilaudid  IV, Precedex and Tylenol  PO (pre-op)*   Induction: Intravenous  PONV Risk Score and Plan: 4 or greater and Treatment may vary due to age or medical condition, Propofol  infusion and Ondansetron   Airway Management Planned: Natural Airway and Simple Face Mask  Additional Equipment: None  Intra-op Plan:   Post-operative Plan:   Informed Consent: I have reviewed the patients History and Physical, chart, labs and discussed the procedure including the risks, benefits and alternatives for the proposed anesthesia with the patient or authorized representative who has indicated his/her understanding and acceptance.     Dental advisory given  Plan Discussed with: CRNA and Anesthesiologist  Anesthesia Plan Comments: (PAT note written 08/20/2023 by Allison Zelenak, PA-C.  Nuclear Stress Test 04/16/23 ([facility]): Impression:  1. No chest pain with stress.  2. No ST changes to suggest ischemia.  3. Normal LV systolic function and wall motion.  4. No ischemia by perfusion imaging.  5. Low risk study.      14 Day Cardiac Monitor 10/01/22 ([facility]): Rhythm was sinus with an average heart rate of 83 bpm, rate range 57-124 bpm  Mobitz I  2nd degree AV block was noted (0.54%)  One atrial run, lasted for 16 beats. There was baseline motion artifact but rhythm appears to be irregular.  There were rare PVCs (<0.01%) and rare PACs (<0.01%).  6 patient events during sinus rhythm.  Conclusion:  - One episode of short atrial run (16 beats), rhythm appears irregular, which may represent early stage of atrial  fibrillation      Echo 09/12/22 ([facility]): Technically difficult study  Normal left ventricle systolic function  LVEF 60-65%  Right ventricle is not well-visualized but appears normal in size with  preserved systolic function  Unable to assess RVSP due to incomplete TR Doppler signal  - Compared to prior study, right ventricle systolic function has normalized.  )         Anesthesia Quick Evaluation

## 2023-08-20 NOTE — Progress Notes (Signed)
 Chief Complaint  Patient presents with   Nail Problem    Pt is here due to discoloration to bilateral toenails, recently went to [facility] to get polish off toenails and discovered it states it was not like that before she got a pedicure 2-3 months ago.    Subjective: 78 y.o. female presenting today for complaint of discoloration to the bilateral great toenails.  She says that she had a pedicure a few months prior and recently remove the nail polish and noticed discoloration to the toenails.  Past Medical History:  Diagnosis Date   Achalasia 09/28/2012   Allergic rhinitis 03/01/2015   Anemia    Aneurysm (HCC) 03/01/2015   stable 5 mm periopthalmic right ICA aneurysm 03/25/21 CTA   BP (high blood pressure) 03/01/2015   Bronchitis 04/23/2018   Cephalalgia 08/24/2012   Overview:  ICD-10 cut over     Chest pain 04/04/2011   Coronary Ca Score 0, no significant CAD 08/20/18; CP with troponin 4>32 s/p LHC showing normal coronaries 12/29/18   CN (constipation) 09/28/2012   Coronary artery disease    Coughing 04/24/2017   Decreased potassium in the blood 03/01/2015   Diverticulosis    Dizziness 03/01/2015   Erythropoietin  deficiency anemia 03/13/2021   GERD (gastroesophageal reflux disease)    Hiatal hernia    Hypercholesterolemia 03/01/2015   Hyperlipidemia    Hypertension    Ingrown toenail 08/10/2017   Inguinal hernia    Iron deficiency anemia 03/13/2021   Migraine headache    Onychomycosis 12/08/2017   Paresthesia of arm 03/01/2015   PE (pulmonary thromboembolism) (HCC) 03/13/2020   in setting of + COVID-19, s/p Eliquis  x 3 month (recurrent)   Schatzki's ring    Sleep apnea    uses CPAP   Tendonitis, Achilles, right 12/08/2017   TIA (transient ischemic attack)    Unilateral primary osteoarthritis, left knee 10/07/2017    Past Surgical History:  Procedure Laterality Date   ABDOMINAL HYSTERECTOMY     BACK SURGERY     x 3   CARDIAC CATHETERIZATION  12/29/2018    angiographically normal coronary arteries 12/29/18 ([facility])   CARPAL TUNNEL RELEASE     left wrist   CERVICAL SPINE SURGERY     HEMORRHOID SURGERY     TOTAL HIP ARTHROPLASTY Left 09/04/2021   Procedure: LEFT TOTAL HIP ARTHROPLASTY ANTERIOR APPROACH;  Surgeon: Vernetta Lonni GRADE, MD;  Location: [facility] OR;  Service: Orthopedics;  Laterality: Left;    Allergies  Allergen Reactions   Methylprednisolone  Other (See Comments)    Lightheaded and Hallucinations   Shellfish Allergy Hives and Swelling   Ace Inhibitors Other (See Comments)    Pt cannot recall her reaction but tolerates arb    Gabapentin  Other (See Comments)    headaches   Pitavastatin     Unknown reaction  Other Reaction(s): Other (See Comments)  ADVERSE REACTION,  Pt unaware of this reaction; states she has been on crestor  in the past with no issues   Sulfa Antibiotics Other (See Comments) and Rash    Fine bumps   Topiramate  Palpitations    Heart race,     Objective: Physical Exam General: The patient is alert and oriented x3 in no acute distress.  Dermatology: Hyperkeratotic, discolored, thickened, onychodystrophy noted. Skin is warm, dry and supple bilateral lower extremities. Negative for open lesions or macerations.  Vascular: Palpable pedal pulses bilaterally. No edema or erythema noted. Capillary refill within normal limits.  Neurological: Grossly intact via light touch  Musculoskeletal Exam: No pedal deformity noted  Assessment: #1 Onychomycosis of toenails bilateral great toes  Plan of Care:  -Patient evaluated -Recommend topical OTC antifungal available at any pharmacy.  Apply daily as the nail grows out -Advised against pedicures for the moment -Return to clinic PRN   Thresa EMERSON Sar, DPM [facility]  Dr. Thresa EMERSON Sar, DPM    [address] [place], [place] [address]                Office [phone]  Fax [phone]

## 2023-08-20 NOTE — Progress Notes (Signed)
 Anesthesia Chart Review:  Case: [id number] Date/Time: 08/26/23 1135   Procedure: ARTHROPLASTY, HIP, TOTAL, ANTERIOR APPROACH (Right: Hip)   Anesthesia type: Spinal   Pre-op diagnosis: Osteoarthritis Right Hip   [facility] ROOM 05 / [facility]   Surgeons: Vernetta Lonni GRADE, MD       DISCUSSION: Patient is a 78 year old female scheduled for the above procedure.  History includes former smoker, HTN, HLD, chest pain (recurrent chest pain dating back > 2013; had CP/elevated troponin 4>32 with normal coronaries 12/29/18 in [facility]; Unable to have cath from right radial approach due to subclavian tortuosity, non-ischemic stress test 04/16/23), PE (03/13/20 in setting of COVID-19, s/p Eliquis  x 3 months; PE with mild right heart strain 07/13/22), GERD, hiatal hernia, migraines (admission for TIA vs complicated migraine 05/2022), achalasia, OSA (uses CPAP), anemia, cerebral aneurysm (stable 5 mm periophthalmic right ICA aneurysm 03/25/21 CTA), osteoarthritis (left THA 09/05/21), spinal surgery (C4-6 ACDF 04/22/01; left L5-S1 hemilaminectomy 12/27/09), elongated uvula (s/p in office trimming by ENT 05/12/23)   BMI is consistent with obesity.   Last cardiology visit was on 04/08/23 by Dr. Zhao for HTN, HLD< recurrent PE, possible TIA follow-up. Holter monitor in 2024 showed atrial runs, concerning for early stages afib. There was also Mobitz 2nd degree type 1 0.54% of the time. Known chronic DOE since COVID in 2022. Echo in July 2024 showed LVEF 60-65%, preserved RV systolic function, trace TR/PR.  She was already on Eliquis  for history of PE. She reported intermittent, random, non-exertional chest pains. A stress test was ordered and done on 04/16/23 showing no chest pain, no ST changes, normal wall motion, no ischemia.    Last PE over one year ago. No DVT noted at that time. CTA chest in May 2025 was negative for PE (done for worsening SOB in setting of out-patient treatment for suspected RUL pneumonia in late  April). No acute process on 06/22/23 CXR. She reported last Eliquis  dose planned for 08/22/23. She denied cough, fever, chest pain, SOB at PAT RN visit.   Wegovy  prescribed for weight loss, not DM. Advised to hold for 7 days prior to surgery.   Anesthesia team to evaluate on the day of surgery.    VS: BP 130/65   Pulse (!) 101   Temp 36.5 C   Resp 17   Ht 5' 5 (1.651 m)   Wt 102.9 kg   SpO2 98%   BMI 37.76 kg/m    PROVIDERS: Copland, Harlene BROCKS, MD  is PCP  - Zhao, Jin, MD is cardiologist [facility]) - Dutta, Simanta, MD is pulmonologist ([facility]). Last visit 07/26/20. Acute hypoxic respiratory failure in setting of COVID PNA and PE resolved. Complete pulmonary rehab. Can follow-up as needed.  [place]ENWOOD Fransisca Bimler, MD is GI [phone] [facility]) - Lorin Norris, MD is immunologist - Fargen, Kyle, MD is neurologist. Per 10/08/22 office note for cerebral aneurysm, I have recommended conservative management and no further surveillance imaging or follow-up given her age, as there would be no indication for treatment unless it were to bleed or cause cranial neuropathy. - She had HEM evaluation by Timmy Coy, MD on 02/09/22 for normochromic normocytic anemia and Sink, April FNP ([facility]) on 08/14/22 for recurrent PE.SABRAShe noted generally plan would be to continue anticoagulation indefinitely unless a compelling reason to discontinue.  A limited hypercoagulable workup was initiated due to the fact she was on current anticoagulation therapy.  It appears she had normal results for Factor V, beta-2 glycoprotein antibody, anticardiolipin,  Factor II/prothrombin.    LABS: Labs reviewed: Acceptable for surgery. (all labs ordered are listed, but only abnormal results are displayed)  Labs Reviewed  CBC WITH DIFFERENTIAL/PLATELET - Abnormal; Notable for the following components:      Result Value   RBC 3.81 (*)    Hemoglobin 11.1 (*)    HCT 35.0 (*)    All other components within normal  limits  BASIC METABOLIC PANEL WITH GFR - Abnormal; Notable for the following components:   Glucose, Bld 101 (*)    All other components within normal limits  SURGICAL PCR SCREEN  TYPE AND SCREEN    Spirometry 06/16/23:  FVC 1.54 L (69%), FEV1 1.15 L (66%), FEV1/FVC 0.75.  Spirometry indicates normal spirometry-reduced FEV1. 4 puffs of Xopenex  given. Post bronchodilator response shows FVC 1.39 L (63%), FEV1 1.20 L (69%, FEV1/FVC 0.86.  There is only a 4.35% change in FEV1.  Spirometry indicates no significant response.  Normal spirometry-reduced FVC.    IMAGES: CTA Chest 06/22/23: IMPRESSION: No evidence of PE, aneurysm or dissection.  CXR 06/22/23: IMPRESSION: No acute cardiopulmonary process.  CTA Head/Neck 08/08/21 ([facility]): Impression: 1.  No acute intracranial abnormality.  2.  No acute arterial abnormality in the head or neck.  3.  Similar appearance of a 4 to 5 mm right paraclinoid internal carotid artery aneurysm since 05/28/2014.  4.  Increasing burden of sialoliths within accessory right submandibular gland tissue. This accessory tissue has atrophied since 2016 suggesting chronic sialoadenitis.     EKG:  EKG 06/22/23:  Normal sinus rhythm Cannot rule out Anterior infarct , age undetermined Abnormal ECG When compared with ECG of 16-Jun-2023 13:51, PREVIOUS ECG IS PRESENT Confirmed by Ruthe Cornet [phone]) on 06/22/2023 2:03:39 PM    CV: Nuclear Stress Test 04/16/23 ([facility]): Impression:  1. No chest pain with stress.  2. No ST changes to suggest ischemia.  3. Normal LV systolic function and wall motion.  4. No ischemia by perfusion imaging.  5. Low risk study.    14 Day Cardiac Monitor 10/01/22 ([facility]): Rhythm was sinus with an average heart rate of 83 bpm, rate range 57-124 bpm  Mobitz I 2nd degree AV block was noted (0.54%)  One atrial run, lasted for 16 beats. There was baseline motion artifact but rhythm appears to be irregular.  There were rare PVCs  (<0.01%) and rare PACs (<0.01%).  6 patient events during sinus rhythm.  Conclusion:  - One episode of short atrial run (16 beats), rhythm appears irregular, which may represent early stage of atrial fibrillation    Echo 09/12/22 ([facility]): Technically difficult study  Normal left ventricle systolic function  LVEF 60-65%  Right ventricle is not well-visualized but appears normal in size with  preserved systolic function  Unable to assess RVSP due to incomplete TR Doppler signal  - Compared to prior study, right ventricle systolic function has normalized.    BLE Venous US  07/14/22 ([facility]): IMPRESSION: Negative study. No deep vein thrombosis of the bilateral lower extremity.       US  Carotid 06/09/20: IMPRESSION: - Minor bilateral carotid atherosclerosis. No hemodynamically significant ICA stenosis. Degree of narrowing less than 50% bilaterally by ultrasound criteria. - Patent antegrade vertebral flow bilaterally      Cardiac cath 12/29/18 ([facility]):  Angiographically normal coronary arteries.   Maximize medical therapy.       CT Coronary 08/20/18: FINDINGS: - Non-cardiac: See separate report from [facility]. - Pulmonary veins drain normally to the  left [facility]. Cannot rule out a small PFO. - Calcium  Score: 0 Agatston units. - Coronary Arteries: Right dominant with no anomalies - LM: No plaque or stenosis. - LAD system:  No plaque or stenosis. - Circumflex system: No plaque or stenosis. - RCA system:  No plaque or stenosis. IMPRESSION: 1. Coronary artery calcium  score 0 Agatston units. This suggests low risk for future cardiac events. 2.  No significant coronary disease noted.   Past Medical History:  Diagnosis Date   Achalasia 09/28/2012   Allergic rhinitis 03/01/2015   Anemia    Aneurysm (HCC) 03/01/2015   stable 5 mm periopthalmic right ICA aneurysm 03/25/21 CTA   BP (high blood pressure) 03/01/2015   Bronchitis 04/23/2018   Cephalalgia  08/24/2012   Overview:  ICD-10 cut over     Chest pain 04/04/2011   Coronary Ca Score 0, no significant CAD 08/20/18; CP with troponin 4>32 s/p LHC showing normal coronaries 12/29/18   CN (constipation) 09/28/2012   Coronary artery disease    Coughing 04/24/2017   Decreased potassium in the blood 03/01/2015   Diverticulosis    Dizziness 03/01/2015   Erythropoietin  deficiency anemia 03/13/2021   GERD (gastroesophageal reflux disease)    Hiatal hernia    Hypercholesterolemia 03/01/2015   Hyperlipidemia    Hypertension    Ingrown toenail 08/10/2017   Inguinal hernia    Iron deficiency anemia 03/13/2021   Migraine headache    Onychomycosis 12/08/2017   Paresthesia of arm 03/01/2015   PE (pulmonary thromboembolism) (HCC) 03/13/2020   in setting of + COVID-19, s/p Eliquis  x 3 month (recurrent)   Schatzki's ring    Sleep apnea    uses CPAP   Tendonitis, Achilles, right 12/08/2017   TIA (transient ischemic attack)    Unilateral primary osteoarthritis, left knee 10/07/2017    Past Surgical History:  Procedure Laterality Date   ABDOMINAL HYSTERECTOMY     BACK SURGERY     x 3   CARDIAC CATHETERIZATION  12/29/2018   angiographically normal coronary arteries 12/29/18 ([facility])   CARPAL TUNNEL RELEASE     left wrist   CERVICAL SPINE SURGERY     HEMORRHOID SURGERY     TOTAL HIP ARTHROPLASTY Left 09/04/2021   Procedure: LEFT TOTAL HIP ARTHROPLASTY ANTERIOR APPROACH;  Surgeon: Vernetta Lonni GRADE, MD;  Location: [facility];  Service: Orthopedics;  Laterality: Left;    MEDICATIONS:  albuterol  (VENTOLIN  HFA) 108 (90 Base) MCG/ACT inhaler   apixaban  (ELIQUIS ) 5 MG TABS tablet   Calcium  Carb-Cholecalciferol (CALCIUM  600 + D PO)   carboxymethylcellulose (REFRESH PLUS) 0.5 % SOLN   carvedilol  (COREG ) 6.25 MG tablet   Cyanocobalamin  (B-12 PO)   ELDERBERRY PO   EPINEPHrine  (EPIPEN  2-PAK) 0.3 mg/0.3 mL IJ SOAJ injection   fluticasone  (FLONASE ) 50 MCG/ACT nasal spray    losartan -hydrochlorothiazide  (HYZAAR ) 50-12.5 MG tablet   Menthol , Topical Analgesic, (BIOFREEZE EX)   Multiple Vitamins-Minerals (ALIVE MULTI-VITAMIN PO)   NON FORMULARY   nortriptyline  (PAMELOR ) 25 MG capsule   omeprazole  (PRILOSEC) 40 MG capsule   polyethylene glycol (MIRALAX ) 17 g packet   potassium chloride  SA (KLOR-CON  M20) 20 MEQ tablet   pregabalin  (LYRICA ) 75 MG capsule   rosuvastatin  (CRESTOR ) 20 MG tablet   Semaglutide -Weight Management (WEGOVY ) 0.5 MG/0.5ML SOAJ   spironolactone  (ALDACTONE ) 25 MG tablet   tobramycin  (TOBREX ) 0.3 % ophthalmic solution   traMADol  (ULTRAM ) 50 MG tablet   No current facility-administered medications for this encounter.    Isaiah Ruder, PA-C Surgical Short Stay/Anesthesiology [facility]  Phone [phone] [facility] Phone [phone] 08/20/2023 3:51 PM

## 2023-08-20 NOTE — Progress Notes (Signed)
AVS printed and given to patient

## 2023-08-21 ENCOUNTER — Telehealth: Payer: Self-pay | Admitting: Orthopaedic Surgery

## 2023-08-21 NOTE — Telephone Encounter (Signed)
 Patient called and went to get the polish off her toenails and and she is upset that her toes is very dark and wants to know is it safe to have the procedure? [phone]

## 2023-08-25 ENCOUNTER — Telehealth: Payer: Self-pay | Admitting: *Deleted

## 2023-08-25 ENCOUNTER — Other Ambulatory Visit (HOSPITAL_BASED_OUTPATIENT_CLINIC_OR_DEPARTMENT_OTHER): Payer: Self-pay

## 2023-08-25 NOTE — Telephone Encounter (Signed)
 [facility] RNCM pre-op call to patient to discuss her upcoming Right total hip replacement.

## 2023-08-25 NOTE — H&P (Signed)
 TOTAL HIP ADMISSION H&P  Patient is admitted for right total hip arthroplasty.  Subjective:  Chief Complaint: right hip pain  HPI: Stephanie Parks, 78 y.o. female, has a history of pain and functional disability in the right hip(s) due to arthritis and patient has failed non-surgical conservative treatments for greater than 12 weeks to include NSAID's and/or analgesics, corticosteriod injections, use of assistive devices, weight reduction as appropriate, and activity modification.  Onset of symptoms was gradual starting several years ago with gradually worsening course since that time.The patient noted no past surgery on the right hip(s).  Patient currently rates pain in the right hip at 10 out of 10 with activity. Patient has night pain, worsening of pain with activity and weight bearing, trendelenberg gait, pain that interfers with activities of daily living, and pain with passive range of motion. Patient has evidence of subchondral sclerosis, periarticular osteophytes, and joint space narrowing by imaging studies. This condition presents safety issues increasing the risk of falls.  There is no current active infection.  Patient Active Problem List   Diagnosis Date Noted   Unilateral primary osteoarthritis, right knee 07/02/2023   Multiple subsegmental pulmonary emboli without acute cor pulmonale (HCC) 07/23/2022   TIA (transient ischemic attack) 06/08/2022   Diastolic dysfunction 06/08/2022   Obesity (BMI 30-39.9) 06/08/2022   OSA (obstructive sleep apnea) 06/08/2022   Unilateral primary osteoarthritis, right hip 05/29/2022   Status post total replacement of left hip 09/04/2021   DJD (degenerative joint disease) 09/04/2021   Iron deficiency anemia 03/13/2021   Erythropoietin  deficiency anemia 03/13/2021   Greater trochanteric bursitis of left hip 10/18/2020   Headache 06/07/2020   Acute respiratory failure with hypoxia (HCC) 05/02/2020   Post-COVID chronic dyspnea 05/02/2020    Pulsatile neck mass 04/26/2020   Pneumonia due to COVID-19 virus 03/12/2020   Headache disorder 01/04/2020   Seasonal allergic conjunctivitis 10/04/2019   Dysfunction of right eustachian tube 10/04/2019   Pre-diabetes 06/02/2019   Seasonal and perennial allergic rhinitis 12/22/2018   Heart palpitations 12/22/2018   History of chest pain 07/20/2018   Brain aneurysm 07/20/2018   Dyslipidemia 05/04/2018   Tendonitis, Achilles, right 12/08/2017   Unilateral primary osteoarthritis, left knee 10/07/2017   Ingrown toenail 08/10/2017   Coughing 04/24/2017   CAD (coronary artery disease) 03/01/2015   Aneurysm (HCC) 03/01/2015   Dizziness 03/01/2015   Hypokalemia 03/01/2015   Paresthesia of arm 03/01/2015   Achalasia 09/28/2012   Constipation 09/28/2012   Cephalalgia 08/24/2012   Essential hypertension 04/19/2011   Hyperlipidemia 04/19/2011   GERD (gastroesophageal reflux disease) 04/19/2011   Chest pain 04/04/2011   Past Medical History:  Diagnosis Date   Achalasia 09/28/2012   Allergic rhinitis 03/01/2015   Anemia    Aneurysm (HCC) 03/01/2015   stable 5 mm periopthalmic right ICA aneurysm 03/25/21 CTA   BP (high blood pressure) 03/01/2015   Bronchitis 04/23/2018   Cephalalgia 08/24/2012   Overview:  ICD-10 cut over     Chest pain 04/04/2011   Coronary Ca Score 0, no significant CAD 08/20/18; CP with troponin 4>32 s/p LHC showing normal coronaries 12/29/18   CN (constipation) 09/28/2012   Coronary artery disease    Coughing 04/24/2017   Decreased potassium in the blood 03/01/2015   Diverticulosis    Dizziness 03/01/2015   Erythropoietin  deficiency anemia 03/13/2021   GERD (gastroesophageal reflux disease)    Hiatal hernia    Hypercholesterolemia 03/01/2015   Hyperlipidemia    Hypertension    Ingrown toenail 08/10/2017  Inguinal hernia    Iron deficiency anemia 03/13/2021   Migraine headache    Onychomycosis 12/08/2017   Paresthesia of arm 03/01/2015   PE (pulmonary  thromboembolism) (HCC) 03/13/2020   in setting of + COVID-19, s/p Eliquis  x 3 month (recurrent)   Schatzki's ring    Sleep apnea    uses CPAP   Tendonitis, Achilles, right 12/08/2017   TIA (transient ischemic attack)    Unilateral primary osteoarthritis, left knee 10/07/2017    Past Surgical History:  Procedure Laterality Date   ABDOMINAL HYSTERECTOMY     BACK SURGERY     x 3   CARDIAC CATHETERIZATION  12/29/2018   angiographically normal coronary arteries 12/29/18 ([place])   CARPAL TUNNEL RELEASE     left wrist   CERVICAL SPINE SURGERY     HEMORRHOID SURGERY     TOTAL HIP ARTHROPLASTY Left 09/04/2021   Procedure: LEFT TOTAL HIP ARTHROPLASTY ANTERIOR APPROACH;  Surgeon: Vernetta Lonni GRADE, MD;  Location: [facility] OR;  Service: Orthopedics;  Laterality: Left;    No current facility-administered medications for this encounter.   Current Outpatient Medications  Medication Sig Dispense Refill Last Dose/Taking   albuterol  (VENTOLIN  HFA) 108 (90 Base) MCG/ACT inhaler Inhale 1-2 puffs into the lungs every 6 (six) hours as needed for wheezing or shortness of breath. 8 g 0 Taking As Needed   apixaban  (ELIQUIS ) 5 MG TABS tablet Take 1 tablet (5 mg total) by mouth 2 (two) times daily. 60 tablet 0 Taking   Calcium  Carb-Cholecalciferol (CALCIUM  600 + D PO) Take 1 tablet by mouth daily.   Taking   carboxymethylcellulose (REFRESH PLUS) 0.5 % SOLN Place 1 drop into both eyes 3 (three) times daily as needed (dry eyes).   Taking As Needed   carvedilol  (COREG ) 6.25 MG tablet Take 1 tablet (6.25 mg total) by mouth 2 (two) times daily with a meal. 180 tablet 1 Taking   Cyanocobalamin  (B-12 PO) Take 2 tablets by mouth daily.   Taking   ELDERBERRY PO Take 1 capsule by mouth daily.   Taking   EPINEPHrine  (EPIPEN  2-PAK) 0.3 mg/0.3 mL IJ SOAJ injection Inject 0.3 mg into the muscle as needed for anaphylaxis. Per allergen immunotherapy protocol 1 each 0 Taking As Needed   fluticasone  (FLONASE ) 50 MCG/ACT  nasal spray Place 2 sprays into both nostrils daily as needed for allergies or rhinitis. 48 g 1 Taking As Needed   losartan -hydrochlorothiazide  (HYZAAR ) 50-12.5 MG tablet Take 0.5 tablets by mouth daily. 45 tablet 3 Taking   Menthol , Topical Analgesic, (BIOFREEZE EX) Apply 1 Application topically daily as needed (pain).   Taking As Needed   Multiple Vitamins-Minerals (ALIVE MULTI-VITAMIN PO) Take 1 tablet by mouth daily.   Taking   nortriptyline  (PAMELOR ) 25 MG capsule Take 2 capsules (50 mg total) by mouth at bedtime. 180 capsule 1 Taking   omeprazole  (PRILOSEC) 40 MG capsule Take 1 capsule (40 mg total) by mouth daily. 90 capsule 3 Taking   potassium chloride  SA (KLOR-CON  M20) 20 MEQ tablet Take 2 tablets (40 mEq total) by mouth daily. 180 tablet 1 Taking   rosuvastatin  (CRESTOR ) 20 MG tablet Take 1 tablet (20 mg total) by mouth daily. 90 tablet 3 Taking   Semaglutide -Weight Management (WEGOVY ) 0.5 MG/0.5ML SOAJ Inject 0.5 mg into the skin once a week. 2 mL 1 Taking   spironolactone  (ALDACTONE ) 25 MG tablet Take 0.5 tablets (12.5 mg total) by mouth daily. 45 tablet 3 Taking   tobramycin  (TOBREX ) 0.3 % ophthalmic solution  Place 2 drops into the right eye every 6 (six) hours. 5 mL 0 Taking   NON FORMULARY Pt uses a cpap nightly      polyethylene glycol (MIRALAX ) 17 g packet Take 17 g by mouth daily as needed for mild constipation or moderate constipation. (Patient not taking: Reported on 08/20/2023) 90 packet 2 Not Taking   pregabalin  (LYRICA ) 75 MG capsule TAKE 1 CAPSULE BY MOUTH TWICE A DAY (Patient not taking: Reported on 08/20/2023) 60 capsule 0 Not Taking   traMADol  (ULTRAM ) 50 MG tablet Take 1 tablet (50 mg total) by mouth every 8 (eight) hours as needed. (Patient not taking: Reported on 08/20/2023) 60 tablet 0 Not Taking   Allergies  Allergen Reactions   Methylprednisolone  Other (See Comments)    Lightheaded and Hallucinations   Shellfish Allergy Hives and Swelling   Ace Inhibitors Other (See  Comments)    Pt cannot recall her reaction but tolerates arb    Gabapentin  Other (See Comments)    headaches   Pitavastatin     Unknown reaction  Other Reaction(s): Other (See Comments)  ADVERSE REACTION,  Pt unaware of this reaction; states she has been on crestor  in the past with no issues   Sulfa Antibiotics Other (See Comments) and Rash    Fine bumps   Topiramate  Palpitations    Heart race,     Social History   Tobacco Use   Smoking status: Former    Current packs/day: 0.00    Average packs/day: 0.5 packs/day for 20.0 years (10.0 ttl pk-yrs)    Types: Cigarettes    Start date: 02/19/1964    Quit date: 02/19/1984    Years since quitting: 39.5   Smokeless tobacco: Never  Substance Use Topics   Alcohol  use: No    Family History  Problem Relation Age of Onset   Allergic rhinitis Sister    Diabetes Sister    Hypertension Sister    Heart attack Father 50   Heart disease Father    Stomach cancer Maternal Grandmother    Heart disease Mother    Colon cancer Neg Hx    Angioedema Neg Hx    Asthma Neg Hx    Eczema Neg Hx    Urticaria Neg Hx    Immunodeficiency Neg Hx    Breast cancer Neg Hx    Migraines Neg Hx    Headache Neg Hx      Review of Systems  Objective:  Physical Exam Vitals reviewed.  Constitutional:      Appearance: Normal appearance. She is obese.  HENT:     Head: Normocephalic and atraumatic.  Eyes:     Extraocular Movements: Extraocular movements intact.     Pupils: Pupils are equal, round, and reactive to light.  Cardiovascular:     Rate and Rhythm: Normal rate.  Pulmonary:     Effort: Pulmonary effort is normal.     Breath sounds: Normal breath sounds.  Abdominal:     Palpations: Abdomen is soft.  Musculoskeletal:     Cervical back: Normal range of motion and neck supple.     Right hip: Tenderness and bony tenderness present. Decreased range of motion. Decreased strength.  Neurological:     Mental Status: She is alert and oriented to  person, place, and time.  Psychiatric:        Behavior: Behavior normal.     Vital signs in last 24 hours:    Labs:   Estimated body mass index is 37.76 kg/m  as calculated from the following:   Height as of 08/20/23: 5' 5 (1.651 m).   Weight as of 08/20/23: 102.9 kg.   Imaging Review Plain radiographs demonstrate severe degenerative joint disease of the right hip(s). The bone quality appears to be good for age and reported activity level.      Assessment/Plan:  End stage arthritis, right hip(s)  The patient history, physical examination, clinical judgement of the provider and imaging studies are consistent with end stage degenerative joint disease of the right hip(s) and total hip arthroplasty is deemed medically necessary. The treatment options including medical management, injection therapy, arthroscopy and arthroplasty were discussed at length. The risks and benefits of total hip arthroplasty were presented and reviewed. The risks due to aseptic loosening, infection, stiffness, dislocation/subluxation,  thromboembolic complications and other imponderables were discussed.  The patient acknowledged the explanation, agreed to proceed with the plan and consent was signed. Patient is being admitted for inpatient treatment for surgery, pain control, PT, OT, prophylactic antibiotics, VTE prophylaxis, progressive ambulation and ADL's and discharge planning.The patient is planning to be discharged home with home health services

## 2023-08-25 NOTE — Care Plan (Signed)
 [facility] RNCM call to patient to discuss her upcoming Right total hip arthroplasty with Dr. Vernetta on 08/26/23 at [facility]. She is agreeable to case management. She has a son that will be assisting her at her home after discharge. She has a RW. Anticipate HHPT will be needed after a short hospital stay. Referral made to [facility] after choice provided. Reviewed post op care instructions. Will continue to follow for needs.

## 2023-08-26 ENCOUNTER — Encounter (HOSPITAL_COMMUNITY): Payer: Self-pay | Admitting: Orthopaedic Surgery

## 2023-08-26 ENCOUNTER — Encounter (HOSPITAL_COMMUNITY)
Admission: RE | Disposition: A | Discharge status: Home / Self Care | Payer: Self-pay | Source: Home / Self Care | Attending: Orthopaedic Surgery

## 2023-08-26 ENCOUNTER — Ambulatory Visit (HOSPITAL_COMMUNITY)

## 2023-08-26 ENCOUNTER — Ambulatory Visit (HOSPITAL_COMMUNITY): Payer: Self-pay | Admitting: Vascular Surgery

## 2023-08-26 ENCOUNTER — Other Ambulatory Visit: Payer: Self-pay

## 2023-08-26 ENCOUNTER — Ambulatory Visit (HOSPITAL_BASED_OUTPATIENT_CLINIC_OR_DEPARTMENT_OTHER): Admitting: Anesthesiology

## 2023-08-26 ENCOUNTER — Observation Stay (HOSPITAL_COMMUNITY)

## 2023-08-26 ENCOUNTER — Observation Stay (HOSPITAL_COMMUNITY)
Admission: RE | Admit: 2023-08-26 | Discharge: 2023-08-29 | Disposition: A | Discharge status: Home / Self Care | Attending: Orthopaedic Surgery | Admitting: Orthopaedic Surgery

## 2023-08-26 DIAGNOSIS — Z471 Aftercare following joint replacement surgery: Secondary | ICD-10-CM | POA: Diagnosis not present

## 2023-08-26 DIAGNOSIS — Z96642 Presence of left artificial hip joint: Secondary | ICD-10-CM | POA: Insufficient documentation

## 2023-08-26 DIAGNOSIS — Z86711 Personal history of pulmonary embolism: Secondary | ICD-10-CM | POA: Insufficient documentation

## 2023-08-26 DIAGNOSIS — Z7901 Long term (current) use of anticoagulants: Secondary | ICD-10-CM | POA: Insufficient documentation

## 2023-08-26 DIAGNOSIS — Z96641 Presence of right artificial hip joint: Secondary | ICD-10-CM

## 2023-08-26 DIAGNOSIS — M1611 Unilateral primary osteoarthritis, right hip: Principal | ICD-10-CM | POA: Diagnosis present

## 2023-08-26 DIAGNOSIS — I2510 Atherosclerotic heart disease of native coronary artery without angina pectoris: Secondary | ICD-10-CM | POA: Insufficient documentation

## 2023-08-26 DIAGNOSIS — Z87891 Personal history of nicotine dependence: Secondary | ICD-10-CM | POA: Insufficient documentation

## 2023-08-26 DIAGNOSIS — I10 Essential (primary) hypertension: Secondary | ICD-10-CM

## 2023-08-26 DIAGNOSIS — Z8616 Personal history of COVID-19: Secondary | ICD-10-CM | POA: Diagnosis not present

## 2023-08-26 DIAGNOSIS — Z8673 Personal history of transient ischemic attack (TIA), and cerebral infarction without residual deficits: Secondary | ICD-10-CM | POA: Diagnosis not present

## 2023-08-26 DIAGNOSIS — Z96643 Presence of artificial hip joint, bilateral: Secondary | ICD-10-CM | POA: Diagnosis not present

## 2023-08-26 DIAGNOSIS — Z79899 Other long term (current) drug therapy: Secondary | ICD-10-CM | POA: Insufficient documentation

## 2023-08-26 HISTORY — PX: TOTAL HIP ARTHROPLASTY: SHX124

## 2023-08-26 SURGERY — ARTHROPLASTY, HIP, TOTAL, ANTERIOR APPROACH
Anesthesia: Monitor Anesthesia Care | Site: Hip | Laterality: Right

## 2023-08-26 MED ORDER — OXYCODONE HCL 5 MG PO TABS: 5.0000 mg | ORAL_TABLET | Freq: Once | ORAL | Status: AC | PRN

## 2023-08-26 MED ORDER — PANTOPRAZOLE SODIUM 40 MG PO TBEC
40.0000 mg | DELAYED_RELEASE_TABLET | Freq: Every day | ORAL | Status: DC
  Administered 2023-08-26 – 2023-08-29 (×4): 40 mg via ORAL
  Filled 2023-08-26 (×4): qty 1

## 2023-08-26 MED ORDER — PHENOL 1.4 % MT LIQD: 1.0000 | OROMUCOSAL | Status: DC | PRN

## 2023-08-26 MED ORDER — PHENYLEPHRINE HCL-NACL 20-0.9 MG/250ML-% IV SOLN
INTRAVENOUS | Status: DC | PRN
  Administered 2023-08-26: 20 ug/min via INTRAVENOUS

## 2023-08-26 MED ORDER — 0.9 % SODIUM CHLORIDE (POUR BTL) OPTIME
TOPICAL | Status: DC | PRN
  Administered 2023-08-26: 1000 mL

## 2023-08-26 MED ORDER — LACTATED RINGERS IV SOLN: INTRAVENOUS | Status: DC | PRN

## 2023-08-26 MED ORDER — OXYCODONE HCL 5 MG PO TABS
10.0000 mg | ORAL_TABLET | ORAL | Status: DC | PRN
  Administered 2023-08-26 – 2023-08-27 (×6): 10 mg via ORAL
  Filled 2023-08-26: qty 2

## 2023-08-26 MED ORDER — LACTATED RINGERS IV SOLN: INTRAVENOUS | Status: DC

## 2023-08-26 MED ORDER — METOCLOPRAMIDE HCL 5 MG/ML IJ SOLN: 5.0000 mg | Freq: Three times a day (TID) | INTRAMUSCULAR | Status: DC | PRN

## 2023-08-26 MED ORDER — CEFAZOLIN SODIUM-DEXTROSE 2-4 GM/100ML-% IV SOLN
2.0000 g | Freq: Four times a day (QID) | INTRAVENOUS | Status: AC
  Administered 2023-08-26 – 2023-08-27 (×2): 2 g via INTRAVENOUS
  Filled 2023-08-26 (×2): qty 100

## 2023-08-26 MED ORDER — PHENYLEPHRINE 80 MCG/ML (10ML) SYRINGE FOR IV PUSH (FOR BLOOD PRESSURE SUPPORT)
PREFILLED_SYRINGE | INTRAVENOUS | Status: DC | PRN
  Administered 2023-08-26: 160 ug via INTRAVENOUS

## 2023-08-26 MED ORDER — HYDROCHLOROTHIAZIDE 12.5 MG PO TABS
12.5000 mg | ORAL_TABLET | Freq: Every day | ORAL | Status: DC
  Administered 2023-08-26 – 2023-08-28 (×3): 12.5 mg via ORAL
  Filled 2023-08-26 (×3): qty 1

## 2023-08-26 MED ORDER — POVIDONE-IODINE 10 % EX SWAB: 2.0000 | Freq: Once | CUTANEOUS | Status: DC

## 2023-08-26 MED ORDER — ONDANSETRON HCL 4 MG PO TABS: 4.0000 mg | ORAL_TABLET | Freq: Four times a day (QID) | ORAL | Status: DC | PRN

## 2023-08-26 MED ORDER — ALBUTEROL SULFATE (2.5 MG/3ML) 0.083% IN NEBU: 3.0000 mL | INHALATION_SOLUTION | Freq: Four times a day (QID) | RESPIRATORY_TRACT | Status: DC | PRN

## 2023-08-26 MED ORDER — METOCLOPRAMIDE HCL 5 MG PO TABS: 5.0000 mg | ORAL_TABLET | Freq: Three times a day (TID) | ORAL | Status: DC | PRN

## 2023-08-26 MED ORDER — SODIUM CHLORIDE 0.9 % IR SOLN
Status: DC | PRN
  Administered 2023-08-26: 1000 mL

## 2023-08-26 MED ORDER — HYDROMORPHONE HCL 1 MG/ML IJ SOLN
INTRAMUSCULAR | Status: AC
  Filled 2023-08-26: qty 1

## 2023-08-26 MED ORDER — DOCUSATE SODIUM 100 MG PO CAPS
100.0000 mg | ORAL_CAPSULE | Freq: Two times a day (BID) | ORAL | Status: DC
  Administered 2023-08-26 – 2023-08-29 (×6): 100 mg via ORAL
  Filled 2023-08-26 (×5): qty 1

## 2023-08-26 MED ORDER — LOSARTAN POTASSIUM 50 MG PO TABS
50.0000 mg | ORAL_TABLET | Freq: Every day | ORAL | Status: DC
  Administered 2023-08-26 – 2023-08-28 (×3): 50 mg via ORAL
  Filled 2023-08-26 (×3): qty 1

## 2023-08-26 MED ORDER — OXYCODONE HCL 5 MG/5ML PO SOLN
ORAL | Status: AC
Start: 2023-08-26 — End: 2023-08-26
  Filled 2023-08-26: qty 5

## 2023-08-26 MED ORDER — CARBOXYMETHYLCELLULOSE SODIUM 0.5 % OP SOLN: 1.0000 [drp] | Freq: Three times a day (TID) | OPHTHALMIC | Status: DC | PRN

## 2023-08-26 MED ORDER — HYDROMORPHONE HCL 1 MG/ML IJ SOLN
0.2500 mg | INTRAMUSCULAR | Status: DC | PRN
  Administered 2023-08-26: 0.5 mg via INTRAVENOUS

## 2023-08-26 MED ORDER — LOSARTAN POTASSIUM-HCTZ 50-12.5 MG PO TABS: 0.5000 | ORAL_TABLET | Freq: Every day | ORAL | Status: DC

## 2023-08-26 MED ORDER — DIPHENHYDRAMINE HCL 12.5 MG/5ML PO ELIX: 12.5000 mg | ORAL_SOLUTION | ORAL | Status: DC | PRN

## 2023-08-26 MED ORDER — SPIRONOLACTONE 12.5 MG HALF TABLET
12.5000 mg | ORAL_TABLET | Freq: Every day | ORAL | Status: DC
  Administered 2023-08-26 – 2023-08-29 (×4): 12.5 mg via ORAL
  Filled 2023-08-26 (×4): qty 1

## 2023-08-26 MED ORDER — SODIUM CHLORIDE 0.9 % IV SOLN: INTRAVENOUS | Status: DC

## 2023-08-26 MED ORDER — TRANEXAMIC ACID-NACL 1000-0.7 MG/100ML-% IV SOLN
1000.0000 mg | INTRAVENOUS | Status: DC
  Filled 2023-08-26: qty 100

## 2023-08-26 MED ORDER — CHLORHEXIDINE GLUCONATE 0.12 % MT SOLN
15.0000 mL | Freq: Once | OROMUCOSAL | Status: AC
  Administered 2023-08-26: 15 mL via OROMUCOSAL
  Filled 2023-08-26: qty 15

## 2023-08-26 MED ORDER — MENTHOL 3 MG MT LOZG: 1.0000 | LOZENGE | OROMUCOSAL | Status: DC | PRN

## 2023-08-26 MED ORDER — ACETAMINOPHEN 325 MG PO TABS: 325.0000 mg | ORAL_TABLET | Freq: Four times a day (QID) | ORAL | Status: DC | PRN

## 2023-08-26 MED ORDER — ROSUVASTATIN CALCIUM 20 MG PO TABS
20.0000 mg | ORAL_TABLET | Freq: Every day | ORAL | Status: DC
  Administered 2023-08-26 – 2023-08-29 (×4): 20 mg via ORAL
  Filled 2023-08-26 (×4): qty 1

## 2023-08-26 MED ORDER — POLYVINYL ALCOHOL 1.4 % OP SOLN: 1.0000 [drp] | Freq: Three times a day (TID) | OPHTHALMIC | Status: DC | PRN

## 2023-08-26 MED ORDER — ONDANSETRON HCL 4 MG/2ML IJ SOLN
INTRAMUSCULAR | Status: DC | PRN
  Administered 2023-08-26: 4 mg via INTRAVENOUS

## 2023-08-26 MED ORDER — NORTRIPTYLINE HCL 25 MG PO CAPS
50.0000 mg | ORAL_CAPSULE | Freq: Every day | ORAL | Status: DC
  Administered 2023-08-26 – 2023-08-28 (×3): 50 mg via ORAL
  Filled 2023-08-26 (×4): qty 2

## 2023-08-26 MED ORDER — MIDAZOLAM HCL 2 MG/2ML IJ SOLN
INTRAMUSCULAR | Status: AC
  Filled 2023-08-26: qty 2

## 2023-08-26 MED ORDER — METHOCARBAMOL 500 MG PO TABS
500.0000 mg | ORAL_TABLET | Freq: Four times a day (QID) | ORAL | Status: DC | PRN
  Administered 2023-08-26 – 2023-08-27 (×4): 500 mg via ORAL
  Filled 2023-08-26 (×4): qty 1

## 2023-08-26 MED ORDER — OXYCODONE HCL 5 MG PO TABS
5.0000 mg | ORAL_TABLET | ORAL | Status: DC | PRN
  Administered 2023-08-27 – 2023-08-28 (×3): 5 mg via ORAL
  Filled 2023-08-26: qty 2
  Filled 2023-08-26 (×2): qty 1
  Filled 2023-08-26 (×2): qty 2
  Filled 2023-08-26: qty 1
  Filled 2023-08-26 (×3): qty 2

## 2023-08-26 MED ORDER — POTASSIUM CHLORIDE CRYS ER 20 MEQ PO TBCR
40.0000 meq | EXTENDED_RELEASE_TABLET | Freq: Every day | ORAL | Status: DC
  Administered 2023-08-26 – 2023-08-29 (×4): 40 meq via ORAL
  Filled 2023-08-26 (×4): qty 2

## 2023-08-26 MED ORDER — BUPIVACAINE IN DEXTROSE 0.75-8.25 % IT SOLN
INTRATHECAL | Status: DC | PRN
  Administered 2023-08-26: 1.8 mL via INTRATHECAL

## 2023-08-26 MED ORDER — ONDANSETRON HCL 4 MG/2ML IJ SOLN: 4.0000 mg | Freq: Four times a day (QID) | INTRAMUSCULAR | Status: DC | PRN

## 2023-08-26 MED ORDER — CEFAZOLIN SODIUM-DEXTROSE 2-4 GM/100ML-% IV SOLN
2.0000 g | INTRAVENOUS | Status: AC
  Administered 2023-08-26: 2 g via INTRAVENOUS
  Filled 2023-08-26: qty 100

## 2023-08-26 MED ORDER — APIXABAN 5 MG PO TABS
5.0000 mg | ORAL_TABLET | Freq: Two times a day (BID) | ORAL | Status: DC
  Administered 2023-08-27 – 2023-08-29 (×5): 5 mg via ORAL
  Filled 2023-08-26 (×6): qty 1

## 2023-08-26 MED ORDER — ALUM & MAG HYDROXIDE-SIMETH 200-200-20 MG/5ML PO SUSP
30.0000 mL | ORAL | Status: DC | PRN
  Administered 2023-08-28 – 2023-08-29 (×3): 30 mL via ORAL
  Filled 2023-08-26 (×3): qty 30

## 2023-08-26 MED ORDER — PROPOFOL 10 MG/ML IV BOLUS
INTRAVENOUS | Status: DC | PRN
  Administered 2023-08-26: 20 mg via INTRAVENOUS

## 2023-08-26 MED ORDER — PROPOFOL 500 MG/50ML IV EMUL
INTRAVENOUS | Status: DC | PRN
  Administered 2023-08-26: 50 ug/kg/min via INTRAVENOUS

## 2023-08-26 MED ORDER — ONDANSETRON HCL 4 MG/2ML IJ SOLN: 4.0000 mg | Freq: Once | INTRAMUSCULAR | Status: DC | PRN

## 2023-08-26 MED ORDER — CARVEDILOL 6.25 MG PO TABS
6.2500 mg | ORAL_TABLET | Freq: Two times a day (BID) | ORAL | Status: DC
  Administered 2023-08-26 – 2023-08-29 (×7): 6.25 mg via ORAL
  Filled 2023-08-26 (×7): qty 1

## 2023-08-26 MED ORDER — TOBRAMYCIN 0.3 % OP SOLN
2.0000 [drp] | Freq: Four times a day (QID) | OPHTHALMIC | Status: DC
  Administered 2023-08-26 – 2023-08-29 (×12): 2 [drp] via OPHTHALMIC
  Filled 2023-08-26: qty 5

## 2023-08-26 MED ORDER — HYDROMORPHONE HCL 1 MG/ML IJ SOLN
0.5000 mg | INTRAMUSCULAR | Status: DC | PRN
  Administered 2023-08-26: 1 mg via INTRAVENOUS
  Filled 2023-08-26: qty 1

## 2023-08-26 MED ORDER — METHOCARBAMOL 1000 MG/10ML IJ SOLN: 500.0000 mg | Freq: Four times a day (QID) | INTRAMUSCULAR | Status: DC | PRN

## 2023-08-26 MED ORDER — MIDAZOLAM HCL 2 MG/2ML IJ SOLN
INTRAMUSCULAR | Status: DC | PRN
Start: 2023-08-26 — End: 2023-08-26
  Administered 2023-08-26: 2 mg via INTRAVENOUS

## 2023-08-26 MED ORDER — OXYCODONE HCL 5 MG/5ML PO SOLN
5.0000 mg | Freq: Once | ORAL | Status: AC | PRN
  Administered 2023-08-26: 5 mg via ORAL

## 2023-08-26 MED ORDER — ORAL CARE MOUTH RINSE
15.0000 mL | Freq: Once | OROMUCOSAL | Status: AC
Start: 2023-08-26 — End: 2023-08-26

## 2023-08-26 SURGICAL SUPPLY — 43 items
BAG COUNTER SPONGE SURGICOUNT (BAG) ×1 IMPLANT
BENZOIN TINCTURE PRP APPL 2/3 (GAUZE/BANDAGES/DRESSINGS) ×1 IMPLANT
BLADE CLIPPER SURG (BLADE) IMPLANT
BLADE SAW SGTL 18X1.27X75 (BLADE) ×1 IMPLANT
COVER SURGICAL LIGHT HANDLE (MISCELLANEOUS) ×1 IMPLANT
CUP SECTOR GRIPTON 50MM (Cup) IMPLANT
DRAPE C-ARM 42X72 X-RAY (DRAPES) ×1 IMPLANT
DRAPE STERI IOBAN 125X83 (DRAPES) ×1 IMPLANT
DRAPE U-SHAPE 47X51 STRL (DRAPES) ×3 IMPLANT
DRSG AQUACEL AG ADV 3.5X10 (GAUZE/BANDAGES/DRESSINGS) ×1 IMPLANT
DURAPREP 26ML APPLICATOR (WOUND CARE) ×1 IMPLANT
ELECT BLADE 6.5 EXT (BLADE) IMPLANT
ELECTRODE BLDE 4.0 EZ CLN MEGD (MISCELLANEOUS) ×1 IMPLANT
ELECTRODE REM PT RTRN 9FT ADLT (ELECTROSURGICAL) ×1 IMPLANT
FACESHIELD WRAPAROUND (MASK) ×2 IMPLANT
FACESHIELD WRAPAROUND OR TEAM (MASK) ×2 IMPLANT
GLOVE BIOGEL PI IND STRL 8 (GLOVE) ×2 IMPLANT
GLOVE ECLIPSE 8.0 STRL XLNG CF (GLOVE) ×1 IMPLANT
GLOVE ORTHO TXT STRL SZ7.5 (GLOVE) ×2 IMPLANT
GOWN STRL REUS W/ TWL LRG LVL3 (GOWN DISPOSABLE) ×2 IMPLANT
GOWN STRL REUS W/ TWL XL LVL3 (GOWN DISPOSABLE) ×2 IMPLANT
HEAD FEM STD 32X+5 STRL (Hips) IMPLANT
KIT BASIN OR (CUSTOM PROCEDURE TRAY) ×1 IMPLANT
KIT TURNOVER KIT B (KITS) ×1 IMPLANT
LINER ACET PNNCL PLUS4 NEUTRAL (Hips) IMPLANT
MANIFOLD NEPTUNE II (INSTRUMENTS) ×1 IMPLANT
NS IRRIG 1000ML POUR BTL (IV SOLUTION) ×1 IMPLANT
PACK TOTAL JOINT (CUSTOM PROCEDURE TRAY) ×1 IMPLANT
PAD ARMBOARD POSITIONER FOAM (MISCELLANEOUS) ×1 IMPLANT
SET HNDPC FAN SPRY TIP SCT (DISPOSABLE) ×1 IMPLANT
STAPLER SKIN PROX 35W (STAPLE) IMPLANT
STEM FEMORAL SZ 5MM STD ACTIS (Stem) IMPLANT
STRIP CLOSURE SKIN 1/2X4 (GAUZE/BANDAGES/DRESSINGS) ×2 IMPLANT
SUT ETHIBOND NAB CT1 #1 30IN (SUTURE) ×1 IMPLANT
SUT MNCRL AB 4-0 PS2 18 (SUTURE) IMPLANT
SUT VIC AB 0 CT1 27XBRD ANBCTR (SUTURE) ×1 IMPLANT
SUT VIC AB 1 CT1 27XBRD ANBCTR (SUTURE) ×1 IMPLANT
SUT VIC AB 2-0 CT1 TAPERPNT 27 (SUTURE) ×1 IMPLANT
TOWEL GREEN STERILE (TOWEL DISPOSABLE) ×1 IMPLANT
TOWEL GREEN STERILE FF (TOWEL DISPOSABLE) ×1 IMPLANT
TRAY CATH INTERMITTENT SS 16FR (CATHETERS) IMPLANT
TRAY FOLEY W/BAG SLVR 16FR ST (SET/KITS/TRAYS/PACK) IMPLANT
WATER STERILE IRR 1000ML POUR (IV SOLUTION) ×2 IMPLANT

## 2023-08-26 NOTE — Discharge Instructions (Signed)
 Per [facility] clinic policy, our goal is ensure optimal postoperative pain control with a multimodal pain management strategy. For all [facility] patients, our goal is to wean post-operative narcotic medications by 6 weeks post-operatively. If this is not possible due to utilization of pain medication prior to surgery, your [facility] doctor will support your acute post-operative pain control for the first 6 weeks postoperatively, with a plan to transition you back to your primary pain team following that. Maralee will work to ensure a Therapist, occupational.  INSTRUCTIONS AFTER JOINT REPLACEMENT   Remove items at home which could result in a fall. This includes throw rugs or furniture in walking pathways ICE to the affected joint every three hours while awake for 30 minutes at a time, for at least the first 3-5 days, and then as needed for pain and swelling.  Continue to use ice for pain and swelling. You may notice swelling that will progress down to the foot and ankle.  This is normal after surgery.  Elevate your leg when you are not up walking on it.   Continue to use the breathing machine you got in the hospital (incentive spirometer) which will help keep your temperature down.  It is common for your temperature to cycle up and down following surgery, especially at night when you are not up moving around and exerting yourself.  The breathing machine keeps your lungs expanded and your temperature down.   DIET:  As you were doing prior to hospitalization, we recommend a well-balanced diet.  DRESSING / WOUND CARE / SHOWERING  Keep the surgical dressing until follow up.  The dressing is water proof, so you can shower without any extra covering.  IF THE DRESSING FALLS OFF or the wound gets wet inside, change the dressing with sterile gauze.  Please use good hand washing techniques before changing the dressing.  Do not use any lotions or creams on the incision until instructed by your surgeon.     ACTIVITY  Increase activity slowly as tolerated, but follow the weight bearing instructions below.   No driving for 6 weeks or until further direction given by your physician.  You cannot drive while taking narcotics.  No lifting or carrying greater than 10 lbs. until further directed by your surgeon. Avoid periods of inactivity such as sitting longer than an hour when not asleep. This helps prevent blood clots.  You may return to work once you are authorized by your doctor.     WEIGHT BEARING   Weight bearing as tolerated with assist device (walker, cane, etc) as directed, use it as long as suggested by your surgeon or therapist, typically at least 4-6 weeks.   EXERCISES  Results after joint replacement surgery are often greatly improved when you follow the exercise, range of motion and muscle strengthening exercises prescribed by your doctor. Safety measures are also important to protect the joint from further injury. Any time any of these exercises cause you to have increased pain or swelling, decrease what you are doing until you are comfortable again and then slowly increase them. If you have problems or questions, call your caregiver or physical therapist for advice.   Rehabilitation is important following a joint replacement. After just a few days of immobilization, the muscles of the leg can become weakened and shrink (atrophy).  These exercises are designed to build up the tone and strength of the thigh and leg muscles and to improve motion. Often times heat used for twenty to thirty minutes before  working out will loosen up your tissues and help with improving the range of motion but do not use heat for the first two weeks following surgery (sometimes heat can increase post-operative swelling).   These exercises can be done on a training (exercise) mat, on the floor, on a table or on a bed. Use whatever works the best and is most comfortable for you.    Use music or television  while you are exercising so that the exercises are a pleasant break in your day. This will make your life better with the exercises acting as a break in your routine that you can look forward to.   Perform all exercises about fifteen times, three times per day or as directed.  You should exercise both the operative leg and the other leg as well.  Exercises include:   Quad Sets - Tighten up the muscle on the front of the thigh (Quad) and hold for 5-10 seconds.   Straight Leg Raises - With your knee straight (if you were given a brace, keep it on), lift the leg to 60 degrees, hold for 3 seconds, and slowly lower the leg.  Perform this exercise against resistance later as your leg gets stronger.  Leg Slides: Lying on your back, slowly slide your foot toward your buttocks, bending your knee up off the floor (only go as far as is comfortable). Then slowly slide your foot back down until your leg is flat on the floor again.  Angel Wings: Lying on your back spread your legs to the side as far apart as you can without causing discomfort.  Hamstring Strength:  Lying on your back, push your heel against the floor with your leg straight by tightening up the muscles of your buttocks.  Repeat, but this time bend your knee to a comfortable angle, and push your heel against the floor.  You may put a pillow under the heel to make it more comfortable if necessary.   A rehabilitation program following joint replacement surgery can speed recovery and prevent re-injury in the future due to weakened muscles. Contact your doctor or a physical therapist for more information on knee rehabilitation.    CONSTIPATION  Constipation is defined medically as fewer than three stools per week and severe constipation as less than one stool per week.  Even if you have a regular bowel pattern at home, your normal regimen is likely to be disrupted due to multiple reasons following surgery.  Combination of anesthesia, postoperative  narcotics, change in appetite and fluid intake all can affect your bowels.   YOU MUST use at least one of the following options; they are listed in order of increasing strength to get the job done.  They are all available over the counter, and you may need to use some, POSSIBLY even all of these options:    Drink plenty of fluids (prune juice may be helpful) and high fiber foods Colace 100 mg by mouth twice a day  Senokot for constipation as directed and as needed Dulcolax (bisacodyl), take with full glass of water  Miralax  (polyethylene glycol) once or twice a day as needed.  If you have tried all these things and are unable to have a bowel movement in the first 3-4 days after surgery call either your surgeon or your primary doctor.    If you experience loose stools or diarrhea, hold the medications until you stool forms back up.  If your symptoms do not get better within 1 week  or if they get worse, check with your doctor.  If you experience the worst abdominal pain ever or develop nausea or vomiting, please contact the office immediately for further recommendations for treatment.   ITCHING:  If you experience itching with your medications, try taking only a single pain pill, or even half a pain pill at a time.  You can also use Benadryl  over the counter for itching or also to help with sleep.   TED HOSE STOCKINGS:  Use stockings on both legs until for at least 2 weeks or as directed by physician office. They may be removed at night for sleeping.  MEDICATIONS:  See your medication summary on the "After Visit Summary" that nursing will review with you.  You may have some home medications which will be placed on hold until you complete the course of blood thinner medication.  It is important for you to complete the blood thinner medication as prescribed.  PRECAUTIONS:  If you experience chest pain or shortness of breath - call 911 immediately for transfer to the hospital emergency department.    If you develop a fever greater that 101 F, purulent drainage from wound, increased redness or drainage from wound, foul odor from the wound/dressing, or calf pain - CONTACT YOUR SURGEON.                                                   FOLLOW-UP APPOINTMENTS:  If you do not already have a post-op appointment, please call the office for an appointment to be seen by your surgeon.  Guidelines for how soon to be seen are listed in your "After Visit Summary", but are typically between 1-4 weeks after surgery.  OTHER INSTRUCTIONS:   Knee Replacement:  Do not place pillow under knee, focus on keeping the knee straight while resting. CPM instructions: 0-90 degrees, 2 hours in the morning, 2 hours in the afternoon, and 2 hours in the evening. Place foam block, curve side up under heel at all times except when in CPM or when walking.  DO NOT modify, tear, cut, or change the foam block in any way.  POST-OPERATIVE OPIOID TAPER INSTRUCTIONS: It is important to wean off of your opioid medication as soon as possible. If you do not need pain medication after your surgery it is ok to stop day one. Opioids include: Codeine , Hydrocodone (Norco, Vicodin), Oxycodone (Percocet, oxycontin ) and hydromorphone  amongst others.  Long term and even short term use of opiods can cause: Increased pain response Dependence Constipation Depression Respiratory depression And more.  Withdrawal symptoms can include Flu like symptoms Nausea, vomiting And more Techniques to manage these symptoms Hydrate well Eat regular healthy meals Stay active Use relaxation techniques(deep breathing, meditating, yoga) Do Not substitute Alcohol  to help with tapering If you have been on opioids for less than two weeks and do not have pain than it is ok to stop all together.  Plan to wean off of opioids This plan should start within one week post op of your joint replacement. Maintain the same interval or time between taking each dose  and first decrease the dose.  Cut the total daily intake of opioids by one tablet each day Next start to increase the time between doses. The last dose that should be eliminated is the evening dose.   MAKE SURE YOU:  Understand these instructions.  Get help right away if you are not doing well or get worse.    Thank you for letting us  be a part of your medical care team.  It is a privilege we respect greatly.  We hope these instructions will help you stay on track for a fast and full recovery!     Dental Antibiotics:  In most cases prophylactic antibiotics for Dental procdeures after total joint surgery are not necessary.  Exceptions are as follows:  1. History of prior total joint infection  2. Severely immunocompromised (Organ Transplant, cancer chemotherapy, Rheumatoid biologic meds such as Humera)  3. Poorly controlled diabetes (A1C &gt; 8.0, blood glucose over 200)  If you have one of these conditions, contact your surgeon for an antibiotic prescription, prior to your dental procedure.  Information on my medicine - ELIQUIS  (apixaban )  This medication education was reviewed with me or my healthcare representative as part of my discharge preparation.    Why was Eliquis  prescribed for you? Eliquis  was prescribed to treat blood clots that may have been found in the veins of your legs (deep vein thrombosis) or in your lungs (pulmonary embolism) and to reduce the risk of them occurring again.  What do You need to know about Eliquis  ? Conintue Eliquis  5 mg tablet taken TWICE daily.  Eliquis  may be taken with or without food.   Try to take the dose about the same time in the morning and in the evening. If you have difficulty swallowing the tablet whole please discuss with your pharmacist how to take the medication safely.  Take Eliquis  exactly as prescribed and DO NOT stop taking Eliquis  without talking to the doctor who prescribed the medication.  Stopping may increase  your risk of developing a new blood clot.  Refill your prescription before you run out.  After discharge, you should have regular check-up appointments with your healthcare provider that is prescribing your Eliquis .    What do you do if you miss a dose? If a dose of ELIQUIS  is not taken at the scheduled time, take it as soon as possible on the same day and twice-daily administration should be resumed. The dose should not be doubled to make up for a missed dose.  Important Safety Information A possible side effect of Eliquis  is bleeding. You should call your healthcare provider right away if you experience any of the following: Bleeding from an injury or your nose that does not stop. Unusual colored urine (red or dark brown) or unusual colored stools (red or black). Unusual bruising for unknown reasons. A serious fall or if you hit your head (even if there is no bleeding).  Some medicines may interact with Eliquis  and might increase your risk of bleeding or clotting while on Eliquis . To help avoid this, consult your healthcare provider or pharmacist prior to using any new prescription or non-prescription medications, including herbals, vitamins, non-steroidal anti-inflammatory drugs (NSAIDs) and supplements.  This website has more information on Eliquis  (apixaban ): [email or web address] dena

## 2023-08-26 NOTE — Anesthesia Postprocedure Evaluation (Signed)
 Anesthesia Post Note  Patient: Stephanie Parks  Procedure(s) Performed: ARTHROPLASTY, HIP, TOTAL, ANTERIOR APPROACH (Right: Hip)     Patient location during evaluation: PACU Anesthesia Type: Spinal Level of consciousness: oriented and awake and alert Pain management: pain level controlled Vital Signs Assessment: post-procedure vital signs reviewed and stable Respiratory status: spontaneous breathing, respiratory function stable and nonlabored ventilation Cardiovascular status: blood pressure returned to baseline and stable Postop Assessment: no headache, no backache, no apparent nausea or vomiting, spinal receding and patient able to bend at knees Anesthetic complications: no   There were no known notable events for this encounter.  Last Vitals:  Vitals:   08/26/23 1345 08/26/23 1400  BP: (!) 145/77 136/76  Pulse: 71 65  Resp: 17 11  Temp:    SpO2: 94% 100%    Last Pain:  Vitals:   08/26/23 1345  TempSrc:   PainSc: 10-Worst pain ever    LLE Motor Response: Purposeful movement;Responds to commands (08/26/23 1415) LLE Sensation: Decreased (08/26/23 1415) RLE Motor Response: Purposeful movement;Responds to commands (08/26/23 1415) RLE Sensation: Decreased;Pain (08/26/23 1415) L Sensory Level: S2-Posterior medial thigh and lower leg (08/26/23 1415) R Sensory Level: S2-Posterior medial thigh and lower leg (08/26/23 1415)  Daymien Goth A.

## 2023-08-26 NOTE — Anesthesia Procedure Notes (Signed)
 Spinal  Patient location during procedure: OR Start time: 08/26/2023 11:35 AM End time: 08/26/2023 11:38 AM Reason for block: surgical anesthesia Staffing Performed: anesthesiologist  Anesthesiologist: Jerrye Sharper, MD Performed by: Jerrye Sharper, MD Authorized by: Jerrye Sharper, MD   Preanesthetic Checklist Completed: patient identified, IV checked, site marked, risks and benefits discussed, surgical consent, monitors and equipment checked, pre-op evaluation and timeout performed Spinal Block Patient position: sitting Prep: DuraPrep and site prepped and draped Patient monitoring: heart rate, cardiac monitor, continuous pulse ox and blood pressure Approach: midline Location: L3-4 Injection technique: single-shot Needle Needle type: Pencan  Needle gauge: 24 G Needle length: 9 cm Needle insertion depth: 7 cm Assessment Sensory level: T4 Events: CSF return Additional Notes Patient tolerated procedure well. Adequate sensory level.

## 2023-08-26 NOTE — Progress Notes (Signed)
 PT Cancellation Note  Patient Details Name: Stephanie Parks MRN: [id number] DOB: [birth year]   Cancelled Treatment:    Reason Eval/Treat Not Completed: Patient declined, no reason specified  Reports she is in too much pain to be evaluated by PT at this time. RN provided dilaudid  prior to attempt. Will request any later PT staff still available to check back this evening, otherwise will plan to evaluate in AM.  Stephanie Parks, PT, DPT [facility]  Rehabilitation Services Physical Therapist Office: [phone] Website: [email or web address]   Stephanie Parks 08/26/2023, 4:20 PM

## 2023-08-26 NOTE — Transfer of Care (Signed)
 Immediate Anesthesia Transfer of Care Note  Patient: Stephanie Parks  Procedure(s) Performed: ARTHROPLASTY, HIP, TOTAL, ANTERIOR APPROACH (Right: Hip)  Patient Location: PACU  Anesthesia Type:MAC and Spinal  Level of Consciousness: awake, alert , and oriented  Airway & Oxygen  Therapy: Patient Spontanous Breathing and Patient connected to nasal cannula oxygen   Post-op Assessment: Report given to RN and Post -op Vital signs reviewed and stable  Post vital signs: Reviewed and stable  Last Vitals:  Vitals Value Taken Time  BP 123/66 08/26/23 13:15  Temp 97 1315  Pulse 72 08/26/23 13:16  Resp 21 08/26/23 13:16  SpO2 100 % 08/26/23 13:16  Vitals shown include unfiled device data.  Last Pain:  Vitals:   08/26/23 1022  TempSrc:   PainSc: 0-No pain         Complications: No notable events documented.

## 2023-08-26 NOTE — Op Note (Signed)
 Operative Note  Date of operation: 08/26/2023 Preoperative diagnosis: Right hip primary osteoarthritis Postoperative diagnosis: Same  Procedure: Right direct anterior total hip arthroplasty  Implants: Implant Name Type Inv. Item Serial No. Manufacturer Lot No. LRB No. Used Action  LINER ACET PNNCL PLUS4 NEUTRAL - [id number] Hips LINER ACET PNNCL PLUS4 NEUTRAL  [employer] [id number] Right 1 Implanted  CUP SECTOR GRIPTON - [id number] Cup CUP SECTOR GRIPTON  [employer] [id number] Right 1 Implanted  STEM FEMORAL SZ STD ACTIS - [id number] Stem STEM FEMORAL SZ STD ACTIS  [employer]PU[employer]TH[employer] [id number] Right 1 Implanted  HEAD FEM STD 32X+5 STRL - [id number] Hips HEAD FEM STD 32X+5 STRL  [employer] [id number] Right 1 Implanted   Surgeon: Lonni GRADE. Vernetta, MD Assistant: Tory Gaskins, PA-C  Anesthesia: Spinal Antibiotics: IV Ancef  EBL: 250 cc Complications: None  Indications: The patient is a 78 year old female well-known to us .  She has debilitating arthritis involving her right hip.  We actually replaced her left hip secondary to arthritis in 2023 and that hip is done well.  She has daily right hip pain.  X-rays show bone-on-bone arthritis of the right hip.  At this point it is detrimentally affecting her mobility, her quality of life and her activities of daily living to the point she does wish to proceed with a total hip arthroplasty on the right side.  We did discuss in length in detail the risks of acute blood loss anemia, nerve and vessel injury, fracture, infection, DVT, dislocation, implant failure, leg length differences, wound healing issues.  She understands that our goals are hopefully decreased pain, improved mobility and improved quality of life.  Procedure description: After informed consent was obtained and the appropriate right hip was marked, the patient was brought to the operating room and set up on the stretcher where spinal anesthesia was  obtained.  She was laid in supine position on stretcher and a Foley catheter is placed.  Traction boots were placed on both her feet and next she was placed supine on the Hana fracture table with a perineal post in place and both legs in inline skeletal traction devices but no traction applied.  Her right operative hip and pelvis were assessed.  The right hip was then prepped and draped with DuraPrep and sterile drapes.  A timeout was called and she was identified as the correct patient and the correct right hip.  An incision was then made just inferior and posterior to the ASIS and carried slightly obliquely down the leg.  Dissection was carried down to the tensor fascia lata muscle and the tensor fascia was divided longitudinally to proceed with a direct anterior approach to the hip.  Circumflex vessels were identified and cauterized.  The hip capsule identified and opened up in L-type format finding a moderate joint effusion.  Cobra retractors were placed around the medial and lateral femoral neck and a femoral neck cut was made with an oscillating saw just proximal to the lesser trochanter.  This cut was completed with an osteotome.  A corkscrew guide was placed in the femoral head and the femoral head was removed in its entirety and there was a wide area of devoid of cartilage.  A bent Hohmann was then placed over the medial acetabular rim and remnants of the acetabular labrum and other debris removed.  Reaming was then initiated from a size 43 reamer and stepwise increments going up to a size 49 reamer with all reamers placed under direct visualization  and the last reamer also placed under direct fluoroscopy in order to obtain the depth of reaming, the inclination and the anteversion.  The real DePuy sector GRIPTION acetabular opponent size 50 was then placed without difficulty followed by a 32+4 polythene liner.  Attention was then turned to the femur.  With the right leg externally rotated to 120 degrees,  extended and adducted, a Mueller retractor was placed medially and a Hohmann retractor behind the greater trochanter.  The lateral joint capsule was released and a box cutting osteotome was used into the femoral canal.  Broaching was initiated using the Actis broaching system from a size 0 going to a size 5.  We trialed a standard offset femoral neck and a 32+1 trial hip ball.  The right leg was brought over and up and with traction and internal rotation reduced in the pelvis.  Based on clinical and radiographic assessment we felt like we needed just a little more leg length.  We dislocated the hip and removed the trial components.  We then placed the real Actis femoral component size 5 with standard offset and went with the real 32+5 metal hip ball.  Again this was reduced and acetabulum we are pleased with leg length, offset, range of motion and stability assessed radiographically and clinically.  The soft tissue was then irrigated with normal saline solution.  Remnants of the joint capsule were closed with interrupted #1 Ethibond suture followed by normal Vicryl to close the tensor fascia.  0 Vicryl was used to close the deep tissue and 2-0 Vicryl was used to close subcutaneous tissue.  The skin was closed with staples.  An Aquacel dressing was applied.  The patient was taken recovery room in stable condition.  Tory Gaskins, PA-C did assist during the entire case and beginning in his assistance was crucial and medically necessary for soft tissue management and retraction, helping guide implant placement and a layered closure of the wound.

## 2023-08-26 NOTE — Interval H&P Note (Signed)
 History and Physical Interval Note: The patient understands that she is here today for a right total hip replacement to treat her significant right hip pain and arthritis.  There has been no acute or interval change in her medical status.  The risks and benefits of surgery have been discussed in detail and informed consent has been obtained.  The right operative hip has been marked.  08/26/2023 10:08 AM  Stephanie Parks  has presented today for surgery, with the diagnosis of Osteoarthritis Right Hip.  The various methods of treatment have been discussed with the patient and family. After consideration of risks, benefits and other options for treatment, the patient has consented to  Procedure(s): ARTHROPLASTY, HIP, TOTAL, ANTERIOR APPROACH (Right) as a surgical intervention.  The patient's history has been reviewed, patient examined, no change in status, stable for surgery.  I have reviewed the patient's chart and labs.  Questions were answered to the patient's satisfaction.     [name]

## 2023-08-26 NOTE — Evaluation (Signed)
 Physical Therapy Evaluation Patient Details Name: Stephanie Parks MRN: [id number] DOB: [birth year] Today's Date: 08/26/2023  History of Present Illness  78 yo female admitted 7/8 for elective right direct anterior total hip arthroplasty. PMHx:  Covid PNA, PE, Schatzki's ring, migraines, chest pain, diverticulosis, achalasia, anemia, R ICA aneurysm  Clinical Impression  Pt presents to PT with deficits in functional mobility, gait, balance, strength, ROM. Pt is able to stand with support of RW, with ambulation deferred due to reports of significant hip pain. PT will follow up tomorrow for a progression of gait and to initiate stair training.         If plan is discharge home, recommend the following: A little help with walking and/or transfers;A lot of help with bathing/dressing/bathroom;Assistance with cooking/housework;Assist for transportation;Help with stairs or ramp for entrance   Can travel by private vehicle        Equipment Recommendations None recommended by PT  Recommendations for Other Services       Functional Status Assessment Patient has had a recent decline in their functional status and demonstrates the ability to make significant improvements in function in a reasonable and predictable amount of time.     Precautions / Restrictions Precautions Precautions: Fall Precaution/Restrictions Comments: Direct anterior - no prec. Restrictions Weight Bearing Restrictions Per Provider Order: Yes RLE Weight Bearing Per Provider Order: Weight bearing as tolerated      Mobility  Bed Mobility Overal bed mobility: Needs Assistance Bed Mobility: Supine to Sit, Sit to Supine     Supine to sit: Mod assist, HOB elevated, Used rails Sit to supine: Mod assist, HOB elevated        Transfers Overall transfer level: Needs assistance Equipment used: Rolling walker (2 wheels) Transfers: Sit to/from Stand Sit to Stand: Min assist                Ambulation/Gait              Pre-gait activities: pt takes 2 steps in place with support of RW and CGA, deferred further ambulation due to pain    Stairs            Wheelchair Mobility     Tilt Bed    Modified Rankin (Stroke Patients Only)       Balance Overall balance assessment: Needs assistance Sitting-balance support: No upper extremity supported, Feet supported Sitting balance-Leahy Scale: Poor   Postural control: Left lateral lean Standing balance support: Bilateral upper extremity supported, Reliant on assistive device for balance Standing balance-Leahy Scale: Poor                               Pertinent Vitals/Pain Pain Assessment Pain Assessment: 0-10 Pain Score: 10-Worst pain ever Pain Location: R hip Pain Descriptors / Indicators: Sore Pain Intervention(s): RN gave pain meds during session    Home Living Family/patient expects to be discharged to:: Private residence Living Arrangements: Alone Available Help at Discharge: Family;Available PRN/intermittently Type of Home: House Home Access: Stairs to enter Entrance Stairs-Rails: Left Entrance Stairs-Number of Steps: 6   Home Layout: One level Home Equipment: Cane - single Information systems manager (2 wheels) Additional Comments: son to stay at night, works during the day. friends available PRN during the day    Prior Function Prior Level of Function : Independent/Modified Independent             Mobility Comments: Uses RW for mobility  Extremity/Trunk Assessment   Upper Extremity Assessment Upper Extremity Assessment: Overall WFL for tasks assessed    Lower Extremity Assessment Lower Extremity Assessment: RLE deficits/detail RLE Deficits / Details: generalized post-op weakness as anticipated on POD 0    Cervical / Trunk Assessment Cervical / Trunk Assessment: Normal  Communication   Communication Communication: No apparent difficulties    Cognition Arousal: Alert Behavior  During Therapy: WFL for tasks assessed/performed   PT - Cognitive impairments: No apparent impairments                         Following commands: Intact       Cueing Cueing Techniques: Verbal cues     General Comments General comments (skin integrity, edema, etc.): pt in NAD    Exercises     Assessment/Plan    PT Assessment Patient needs continued PT services  PT Problem List Decreased strength;Decreased range of motion;Decreased activity tolerance;Decreased balance;Decreased mobility;Decreased knowledge of use of DME;Pain       PT Treatment Interventions DME instruction;Gait training;Stair training;Functional mobility training;Therapeutic activities;Therapeutic exercise;Balance training;Neuromuscular re-education;Patient/family education    PT Goals (Current goals can be found in the Care Plan section)  Acute Rehab PT Goals Patient Stated Goal: to return to independence PT Goal Formulation: With patient Time For Goal Achievement: 08/30/23 Potential to Achieve Goals: Good    Frequency 7X/week     Co-evaluation               AM-PAC PT 6 Clicks Mobility  Outcome Measure Help needed turning from your back to your side while in a flat bed without using bedrails?: A Little Help needed moving from lying on your back to sitting on the side of a flat bed without using bedrails?: A Lot Help needed moving to and from a bed to a chair (including a wheelchair)?: A Little Help needed standing up from a chair using your arms (e.g., wheelchair or bedside chair)?: A Little Help needed to walk in hospital room?: Total Help needed climbing 3-5 steps with a railing? : Total 6 Click Score: 13    End of Session Equipment Utilized During Treatment: Gait belt Activity Tolerance: Patient limited by pain Patient left: in bed;with call bell/phone within reach;with bed alarm set Nurse Communication: Mobility status PT Visit Diagnosis: Other abnormalities of gait and  mobility (R26.89);Muscle weakness (generalized) (M62.81);Pain Pain - Right/Left: Right Pain - part of body: Hip    Time: 1734-1751 PT Time Calculation (min) (ACUTE ONLY): 17 min   Charges:   PT Evaluation $PT Eval Low Complexity: 1 Low   PT General Charges $$ ACUTE PT VISIT: 1 Visit         Bernardino JINNY Ruth, PT, DPT Acute Rehabilitation Office [phone]   Bernardino JINNY Ruth 08/26/2023, 5:58 PM

## 2023-08-27 ENCOUNTER — Encounter (HOSPITAL_COMMUNITY): Payer: Self-pay | Admitting: Orthopaedic Surgery

## 2023-08-27 DIAGNOSIS — I2510 Atherosclerotic heart disease of native coronary artery without angina pectoris: Secondary | ICD-10-CM | POA: Diagnosis not present

## 2023-08-27 DIAGNOSIS — M1611 Unilateral primary osteoarthritis, right hip: Secondary | ICD-10-CM | POA: Diagnosis not present

## 2023-08-27 DIAGNOSIS — Z7901 Long term (current) use of anticoagulants: Secondary | ICD-10-CM | POA: Diagnosis not present

## 2023-08-27 DIAGNOSIS — Z8673 Personal history of transient ischemic attack (TIA), and cerebral infarction without residual deficits: Secondary | ICD-10-CM | POA: Diagnosis not present

## 2023-08-27 DIAGNOSIS — Z96642 Presence of left artificial hip joint: Secondary | ICD-10-CM | POA: Diagnosis not present

## 2023-08-27 DIAGNOSIS — Z79899 Other long term (current) drug therapy: Secondary | ICD-10-CM | POA: Diagnosis not present

## 2023-08-27 DIAGNOSIS — Z86711 Personal history of pulmonary embolism: Secondary | ICD-10-CM | POA: Diagnosis not present

## 2023-08-27 DIAGNOSIS — Z87891 Personal history of nicotine dependence: Secondary | ICD-10-CM | POA: Diagnosis not present

## 2023-08-27 LAB — CBC
HCT: 35.3 % — ABNORMAL LOW (ref 36.0–46.0)
Hemoglobin: 11.3 g/dL — ABNORMAL LOW (ref 12.0–15.0)
MCH: 29.2 pg (ref 26.0–34.0)
MCHC: 32 g/dL (ref 30.0–36.0)
MCV: 91.2 fL (ref 80.0–100.0)
Platelets: 257 K/uL (ref 150–400)
RBC: 3.87 MIL/uL (ref 3.87–5.11)
RDW: 15.2 % (ref 11.5–15.5)
WBC: 13.8 K/uL — ABNORMAL HIGH (ref 4.0–10.5)
nRBC: 0 % (ref 0.0–0.2)

## 2023-08-27 LAB — BASIC METABOLIC PANEL WITH GFR
Anion gap: 15 (ref 5–15)
BUN: 7 mg/dL — ABNORMAL LOW (ref 8–23)
CO2: 24 mmol/L (ref 22–32)
Calcium: 9.2 mg/dL (ref 8.9–10.3)
Chloride: 95 mmol/L — ABNORMAL LOW (ref 98–111)
Creatinine, Ser: 0.9 mg/dL (ref 0.44–1.00)
GFR, Estimated: >60 mL/min (ref >60)
Glucose, Bld: 133 mg/dL — ABNORMAL HIGH (ref 70–99)
Potassium: 3.9 mmol/L (ref 3.5–5.1)
Sodium: 134 mmol/L — ABNORMAL LOW (ref 135–145)

## 2023-08-27 NOTE — NC FL2 (Signed)
 [place]  MEDICAID FL2 LEVEL OF CARE FORM     IDENTIFICATION  Patient Name: Stephanie Parks Birthdate: 28-Jan-1946 Sex: female Admission Date (Current Location): 08/26/2023  [facility] and [place] Number:   Nita)   Facility and Address:  [facility], [address] [place], [place] [address]      Provider Number: [id number]  Attending Physician Name and Address:  Vernetta Lonni GRADE,*  Relative Name and Phone Number:  Lynwood Lamar Blades [phone]  [phone]    Current Level of Care: Hospital Recommended Level of Care: Skilled Nursing Facility Prior Approval Number:    Date Approved/Denied:   PASRR Number: [id number]  Discharge Plan: SNF    Current Diagnoses: Patient Active Problem List   Diagnosis Date Noted   Status post total replacement of right hip 08/26/2023   Unilateral primary osteoarthritis, right knee 07/02/2023   Multiple subsegmental pulmonary emboli without acute cor pulmonale (HCC) 07/23/2022   TIA (transient ischemic attack) 06/08/2022   Diastolic dysfunction 06/08/2022   Obesity (BMI 30-39.9) 06/08/2022   OSA (obstructive sleep apnea) 06/08/2022   Unilateral primary osteoarthritis, right hip 05/29/2022   Status post total replacement of left hip 09/04/2021   DJD (degenerative joint disease) 09/04/2021   Iron deficiency anemia 03/13/2021   Erythropoietin  deficiency anemia 03/13/2021   Greater trochanteric bursitis of left hip 10/18/2020   Headache 06/07/2020   Acute respiratory failure with hypoxia (HCC) 05/02/2020   Post-COVID chronic dyspnea 05/02/2020   Pulsatile neck mass 04/26/2020   Pneumonia due to COVID-19 virus 03/12/2020   Headache disorder 01/04/2020   Seasonal allergic conjunctivitis 10/04/2019   Dysfunction of right eustachian tube 10/04/2019   Pre-diabetes 06/02/2019   Seasonal and perennial allergic rhinitis 12/22/2018   Heart palpitations 12/22/2018   History of chest pain 07/20/2018   Brain  aneurysm 07/20/2018   Dyslipidemia 05/04/2018   Tendonitis, Achilles, right 12/08/2017   Unilateral primary osteoarthritis, left knee 10/07/2017   Ingrown toenail 08/10/2017   Coughing 04/24/2017   CAD (coronary artery disease) 03/01/2015   Aneurysm (HCC) 03/01/2015   Dizziness 03/01/2015   Hypokalemia 03/01/2015   Paresthesia of arm 03/01/2015   Achalasia 09/28/2012   Constipation 09/28/2012   Cephalalgia 08/24/2012   Essential hypertension 04/19/2011   Hyperlipidemia 04/19/2011   GERD (gastroesophageal reflux disease) 04/19/2011   Chest pain 04/04/2011    Orientation RESPIRATION BLADDER Height & Weight     Self, Time, Situation, Place  Normal Continent Weight: 102.9 kg Height:  5' 5 (165.1 cm)  BEHAVIORAL SYMPTOMS/MOOD NEUROLOGICAL BOWEL NUTRITION STATUS      Continent Diet (Regular with thin liquids)  AMBULATORY STATUS COMMUNICATION OF NEEDS Skin   Extensive Assist Verbally Surgical wounds (Rt hip)                       Personal Care Assistance Level of Assistance  Bathing, Feeding, Dressing Bathing Assistance: Maximum assistance Feeding assistance: Independent Dressing Assistance: Maximum assistance     Functional Limitations Info  Sight, Hearing, Speech Sight Info: Adequate Hearing Info: Adequate Speech Info: Adequate    SPECIAL CARE FACTORS FREQUENCY  PT (By licensed PT), OT (By licensed OT)     PT Frequency: 5x/wk OT Frequency: 5x/wk            Contractures Contractures Info: Not present    Additional Factors Info  Code Status, Allergies Code Status Info: Full Allergies Info: Methylprednisolone , Shellfish Allergy, Ace Inhibitors, Gabapentin , Pitavastatin, Sulfa Antibiotics, Topiramate   Current Medications (08/27/2023):  This is the current hospital active medication list Current Facility-Administered Medications  Medication Dose Route Frequency Provider Last Rate Last Admin   0.9 %  sodium chloride  infusion   Intravenous  Continuous Vernetta Lonni GRADE, MD       acetaminophen  (TYLENOL ) tablet 325-650 mg  325-650 mg Oral Q6H PRN Vernetta Lonni GRADE, MD       albuterol  (PROVENTIL ) (2.5 MG/3ML) 0.083% nebulizer solution 3 mL  3 mL Inhalation Q6H PRN Vernetta Lonni GRADE, MD       alum & mag hydroxide-simeth (MAALOX/MYLANTA) 200-200-20 MG/5ML suspension 30 mL  30 mL Oral Q4H PRN Vernetta Lonni GRADE, MD       apixaban  (ELIQUIS ) tablet 5 mg  5 mg Oral BID Vernetta Lonni GRADE, MD   5 mg at 08/27/23 1043   artificial tears ophthalmic solution 1 drop  1 drop Both Eyes TID PRN Vernetta Lonni GRADE, MD       carvedilol  (COREG ) tablet 6.25 mg  6.25 mg Oral BID WC Vernetta Lonni GRADE, MD   6.25 mg at 08/27/23 0747   diphenhydrAMINE  (BENADRYL ) 12.5 MG/5ML elixir 12.5-25 mg  12.5-25 mg Oral Q4H PRN Vernetta Lonni GRADE, MD       docusate sodium  (COLACE) capsule 100 mg  100 mg Oral BID Vernetta Lonni GRADE, MD   100 mg at 08/27/23 1043   losartan  (COZAAR ) tablet 50 mg  50 mg Oral Daily Blackman, Christopher Y, MD   50 mg at 08/27/23 1043   And   hydrochlorothiazide  (HYDRODIURIL ) tablet 12.5 mg  12.5 mg Oral Daily Vernetta Lonni GRADE, MD   12.5 mg at 08/27/23 1043   HYDROmorphone  (DILAUDID ) injection 0.5-1 mg  0.5-1 mg Intravenous Q4H PRN Vernetta Lonni GRADE, MD   1 mg at 08/26/23 1543   menthol -cetylpyridinium (CEPACOL) lozenge 3 mg  1 lozenge Oral PRN Vernetta Lonni GRADE, MD       Or   phenol (CHLORASEPTIC) mouth spray 1 spray  1 spray Mouth/Throat PRN Vernetta Lonni GRADE, MD       methocarbamol  (ROBAXIN ) tablet 500 mg  500 mg Oral Q6H PRN Vernetta Lonni GRADE, MD   500 mg at 08/27/23 1043   Or   methocarbamol  (ROBAXIN ) injection 500 mg  500 mg Intravenous Q6H PRN Vernetta Lonni GRADE, MD       metoCLOPramide  (REGLAN ) tablet 5-10 mg  5-10 mg Oral Q8H PRN Vernetta Lonni GRADE, MD       Or   metoCLOPramide  (REGLAN ) injection 5-10 mg  5-10 mg Intravenous Q8H PRN Vernetta Lonni GRADE, MD       nortriptyline  (PAMELOR ) capsule 50 mg  50 mg Oral QHS Vernetta Lonni GRADE, MD   50 mg at 08/26/23 2031   ondansetron  (ZOFRAN ) tablet 4 mg  4 mg Oral Q6H PRN Vernetta Lonni GRADE, MD       Or   ondansetron  (ZOFRAN ) injection 4 mg  4 mg Intravenous Q6H PRN Vernetta Lonni GRADE, MD       oxyCODONE  (Oxy IR/ROXICODONE ) immediate release tablet 10-15 mg  10-15 mg Oral Q4H PRN Vernetta Lonni GRADE, MD   10 mg at 08/27/23 1529   oxyCODONE  (Oxy IR/ROXICODONE ) immediate release tablet 5-10 mg  5-10 mg Oral Q4H PRN Vernetta Lonni GRADE, MD       pantoprazole  (PROTONIX ) EC tablet 40 mg  40 mg Oral Daily Blackman, Christopher Y, MD   40 mg at 08/27/23 1043   potassium chloride  SA (KLOR-CON  M) CR tablet 40 mEq  40 mEq Oral Daily Vernetta Lonni GRADE, MD   40 mEq at 08/27/23 1043   rosuvastatin  (CRESTOR ) tablet 20 mg  20 mg Oral Daily Vernetta Lonni GRADE, MD   20 mg at 08/27/23 1043   spironolactone  (ALDACTONE ) tablet 12.5 mg  12.5 mg Oral Daily Vernetta Lonni GRADE, MD   12.5 mg at 08/27/23 1043   tobramycin  (TOBREX ) 0.3 % ophthalmic solution 2 drop  2 drop Right Eye Q6H Vernetta Lonni GRADE, MD   2 drop at 08/27/23 1533     Discharge Medications: Please see discharge summary for a list of discharge medications.  Relevant Imaging Results:  Relevant Lab Results:   Additional Information SS#: [id number]  Andrez JULIANNA George, RN

## 2023-08-27 NOTE — Progress Notes (Addendum)
 Physical Therapy Treatment Patient Details Name: Stephanie Parks MRN: [id number] DOB: [birth year] Today's Date: 08/27/2023   History of Present Illness 78 yo female admitted 7/8 for elective right direct anterior total hip arthroplasty. PMHx:  Covid PNA, PE, Schatzki's ring, migraines, chest pain, diverticulosis, achalasia, anemia, R ICA aneurysm    PT Comments  Pt not progressing towards her physical therapy goals. Received sitting up in chair; took a period of almost 30 minutes for her to only ambulate to the bathroom. Pt requiring moderate assist for transfers and demonstrates decreased R foot clearance and step length, due to gross right lower extremity weakness. Pt not carrying over many cues given from therapist. She became diaphoretic while sitting on the commode and reported lightheadedness. She has not eaten much of anything today, but I got her to eat a couple bites of apple sauce. BP upon return to bed was 103/83 (91), HR 109. Pt cannot verbalize to PT a plan for 24/7 assist. She cannot get herself into and out of bed, even with a leg lifter strap to get onto a bedside commode. Patient will benefit from continued inpatient follow up therapy, <3 hours/day to address deficits and maximize functional mobility.    If plan is discharge home, recommend the following: A lot of help with bathing/dressing/bathroom;Assistance with cooking/housework;Assist for transportation;Help with stairs or ramp for entrance;A lot of help with walking and/or transfers   Can travel by private vehicle        Equipment Recommendations  Wheelchair (measurements PT)    Recommendations for Other Services       Precautions / Restrictions Precautions Precautions: Fall Precaution/Restrictions Comments: Direct anterior - no prec. Restrictions Weight Bearing Restrictions Per Provider Order: Yes RLE Weight Bearing Per Provider Order: Weight bearing as tolerated     Mobility  Bed Mobility Overal bed mobility:  Needs Assistance Bed Mobility: Sit to Supine     Supine to sit: Mod assist, HOB elevated, Used rails Sit to supine: Mod assist   General bed mobility comments: BLE assist back into bed    Transfers Overall transfer level: Needs assistance Equipment used: Rolling walker (2 wheels) Transfers: Sit to/from Stand Sit to Stand: Mod assist           General transfer comment: Significantly increased time to initiate movement, cues for nose over toes, modA to boost up to standing position    Ambulation/Gait Ambulation/Gait assistance: Contact guard assist Gait Distance (Feet): 10 Feet (10, 3) Assistive device: Rolling walker (2 wheels) Gait Pattern/deviations: Step-to pattern, Decreased dorsiflexion - right, Antalgic, Decreased weight shift to right Gait velocity: decreased Gait velocity interpretation: <1.31 ft/sec, indicative of household ambulator   General Gait Details: Pt with increased R foot external rotation and decreased R pelvic rotation. Max multimodal cues for stepping initiation, sequencing, walker use, neutral R foot positioning, proximity to RW.   Stairs             Wheelchair Mobility     Tilt Bed    Modified Rankin (Stroke Patients Only)       Balance Overall balance assessment: Needs assistance Sitting-balance support: No upper extremity supported, Feet supported Sitting balance-Leahy Scale: Fair     Standing balance support: Bilateral upper extremity supported, Reliant on assistive device for balance Standing balance-Leahy Scale: Poor                              Communication Communication Communication: No apparent difficulties  Cognition Arousal: Alert Behavior During Therapy: WFL for tasks assessed/performed   PT - Cognitive impairments: Initiation, Sequencing, Problem solving, Memory, Awareness                         Following commands: Intact      Cueing Cueing Techniques: Verbal cues  Exercises  Total Joint Exercises Ankle Circles/Pumps: AROM, Right, 10 reps, Supine Quad Sets: AROM, Right, 5 reps, Supine Hip ABduction/ADduction: AAROM, Right, 5 reps, Supine Long Arc Quad: Right, 5 reps, Seated    General Comments        Pertinent Vitals/Pain Pain Assessment Pain Assessment: Faces Faces Pain Scale: Hurts whole lot Pain Location: R hip Pain Descriptors / Indicators: Sore, Operative site guarding, Grimacing Pain Intervention(s): Limited activity within patient's tolerance, Monitored during session    Home Living                          Prior Function            PT Goals (current goals can now be found in the care plan section) Acute Rehab PT Goals Patient Stated Goal: to return to independence PT Goal Formulation: With patient Time For Goal Achievement: 08/30/23 Potential to Achieve Goals: Good Progress towards PT goals: Not progressing toward goals - comment    Frequency    7X/week      PT Plan      Co-evaluation              AM-PAC PT 6 Clicks Mobility   Outcome Measure  Help needed turning from your back to your side while in a flat bed without using bedrails?: A Little Help needed moving from lying on your back to sitting on the side of a flat bed without using bedrails?: A Lot Help needed moving to and from a bed to a chair (including a wheelchair)?: A Little Help needed standing up from a chair using your arms (e.g., wheelchair or bedside chair)?: A Little Help needed to walk in hospital room?: A Little Help needed climbing 3-5 steps with a railing? : Total 6 Click Score: 15    End of Session Equipment Utilized During Treatment: Gait belt Activity Tolerance: Patient limited by pain Patient left: with call bell/phone within reach;in chair Nurse Communication: Mobility status PT Visit Diagnosis: Other abnormalities of gait and mobility (R26.89);Muscle weakness (generalized) (M62.81);Pain Pain - Right/Left: Right Pain - part  of body: Hip     Time: 8677-8591 PT Time Calculation (min) (ACUTE ONLY): 46 min  Charges:    $Therapeutic Activity: 38-52 mins PT General Charges $$ ACUTE PT VISIT: 1 Visit                     Aleck Daring, PT, DPT Acute Rehabilitation Services Office [phone]    Alayne ONEIDA Daring 08/27/2023, 4:17 PM

## 2023-08-27 NOTE — Progress Notes (Signed)
 Subjective: 1 Day Post-Op Procedure(s) (LRB): ARTHROPLASTY, HIP, TOTAL, ANTERIOR APPROACH (Right) Patient reports pain as moderate.    Objective: Vital signs in last 24 hours: Temp:  [97 F (36.1 C)-99.8 F (37.7 C)] 99.8 F (37.7 C) (07/09 0501) Pulse Rate:  [64-103] 103 (07/09 0501) Resp:  [11-20] 18 (07/09 0501) BP: (123-155)/(66-97) 127/71 (07/09 0501) SpO2:  [94 %-100 %] 100 % (07/09 0501) Weight:  [102.9 kg] 102.9 kg (07/08 0948)  Intake/Output from previous day: 07/08 0701 - 07/09 0700 In: 200 [IV Piggyback:200] Out: 600 [Urine:400; Blood:200] Intake/Output this shift: No intake/output data recorded.  No results for input(s): HGB in the last 72 hours. No results for input(s): WBC, RBC, HCT, PLT in the last 72 hours. No results for input(s): NA, K, CL, CO2, BUN, CREATININE, GLUCOSE, CALCIUM  in the last 72 hours. No results for input(s): LABPT, INR in the last 72 hours.  Sensation intact distally Intact pulses distally Dorsiflexion/Plantar flexion intact Incision: scant drainage   Assessment/Plan: 1 Day Post-Op Procedure(s) (LRB): ARTHROPLASTY, HIP, TOTAL, ANTERIOR APPROACH (Right) Up with therapy Discharge home with home health this afternoon if clears therapy.      [name] 08/27/2023, 7:12 AM

## 2023-08-27 NOTE — Progress Notes (Signed)
    Durable Medical Equipment  (From admission, onward)           Start     Ordered   08/27/23 1451  For home use only DME standard manual wheelchair with seat cushion  Once       Comments: Patient suffers from weakness which impairs their ability to perform daily activities like bathing, dressing, and grooming in the home.  A walker will not resolve issue with performing activities of daily living. A wheelchair will allow patient to safely perform daily activities. Patient can safely propel the wheelchair in the home or has a caregiver who can provide assistance. Length of need 6 months . Accessories: elevating leg rests (ELRs), wheel locks, extensions and anti-tippers.   08/27/23 1451   08/26/23 1424  DME 3 n 1  Once        08/26/23 1423   08/26/23 1424  DME Walker rolling  Once       Question Answer Comment  Walker: With 5 Inch Wheels   Patient needs a walker to treat with the following condition Status post total replacement of right hip      08/26/23 1423

## 2023-08-27 NOTE — Care Management Obs Status (Signed)
 MEDICARE OBSERVATION STATUS NOTIFICATION   Patient Details  Name: Stephanie Parks MRN: [id number] Date of Birth: 02-23-1945   Medicare Observation Status Notification Given:  Yes    Jon Cruel 08/27/2023, 9:34 AM

## 2023-08-27 NOTE — Progress Notes (Signed)
 Physical Therapy Treatment Patient Details Name: KEIONNA KINNAIRD MRN: [id number] DOB: 09-25-1945 Today's Date: 08/27/2023   History of Present Illness 78 yo female admitted 7/8 for elective right direct anterior total hip arthroplasty. PMHx:  Covid PNA, PE, Schatzki's ring, migraines, chest pain, diverticulosis, achalasia, anemia, R ICA aneurysm    PT Comments  Pt making limited progress towards her physical therapy goals. Remains limited by R hip pain, reports as burning, and RLE weakness. Pt with weak R quad activation with quad set. Requiring heavy assist for bed mobility, even when provided a leg lifter strap. Pt traversing distance to bathroom ~10 ft in 6 minutes with RW and increased time and effort. Decreased carryover of cueing and instruction for gait training. Reports her son will assist her with mobility. Will progress as tolerated.    If plan is discharge home, recommend the following: A lot of help with bathing/dressing/bathroom;Assistance with cooking/housework;Assist for transportation;Help with stairs or ramp for entrance;A lot of help with walking and/or transfers   Can travel by private vehicle        Equipment Recommendations  None recommended by PT    Recommendations for Other Services       Precautions / Restrictions Precautions Precautions: Fall Precaution/Restrictions Comments: Direct anterior - no prec. Restrictions Weight Bearing Restrictions Per Provider Order: Yes RLE Weight Bearing Per Provider Order: Weight bearing as tolerated     Mobility  Bed Mobility Overal bed mobility: Needs Assistance Bed Mobility: Supine to Sit     Supine to sit: Mod assist, HOB elevated, Used rails     General bed mobility comments: Provided gait belt strap to manuever RLE, however, pt still requiring significant assist to progress off edge of bed and elevate trunk to upright    Transfers Overall transfer level: Needs assistance Equipment used: Rolling walker (2  wheels) Transfers: Sit to/from Stand Sit to Stand: Min assist           General transfer comment: MinA from edge of bed and toilet    Ambulation/Gait Ambulation/Gait assistance: Contact guard assist Gait Distance (Feet): 20 Feet (10, 10) Assistive device: Rolling walker (2 wheels) Gait Pattern/deviations: Step-to pattern, Decreased dorsiflexion - right, Antalgic, Decreased weight shift to right Gait velocity: decreased Gait velocity interpretation: <1.31 ft/sec, indicative of household ambulator   General Gait Details: Pt with increased R foot external rotation and decreased R pelvic rotation. Max multimodal cues for stepping initiation, sequencing, walker use, neutral R foot positioning, proximity to RW.   Stairs             Wheelchair Mobility     Tilt Bed    Modified Rankin (Stroke Patients Only)       Balance Overall balance assessment: Needs assistance Sitting-balance support: No upper extremity supported, Feet supported Sitting balance-Leahy Scale: Fair     Standing balance support: Bilateral upper extremity supported, Reliant on assistive device for balance Standing balance-Leahy Scale: Poor                              Communication Communication Communication: No apparent difficulties  Cognition Arousal: Alert Behavior During Therapy: WFL for tasks assessed/performed   PT - Cognitive impairments: Initiation, Sequencing, Problem solving, Memory, Awareness                         Following commands: Intact      Cueing Cueing Techniques: Verbal cues  Exercises Total  Joint Exercises Ankle Circles/Pumps: AROM, Right, 10 reps, Supine Quad Sets: AROM, Right, 5 reps, Supine Hip ABduction/ADduction: AAROM, Right, 5 reps, Supine    General Comments        Pertinent Vitals/Pain Pain Assessment Pain Assessment: Faces Faces Pain Scale: Hurts whole lot Pain Location: R hip Pain Descriptors / Indicators: Sore, Operative  site guarding, Grimacing Pain Intervention(s): Limited activity within patient's tolerance, Monitored during session, Premedicated before session    Home Living                          Prior Function            PT Goals (current goals can now be found in the care plan section) Acute Rehab PT Goals Patient Stated Goal: to return to independence PT Goal Formulation: With patient Time For Goal Achievement: 08/30/23 Potential to Achieve Goals: Good    Frequency    7X/week      PT Plan      Co-evaluation              AM-PAC PT 6 Clicks Mobility   Outcome Measure  Help needed turning from your back to your side while in a flat bed without using bedrails?: A Little Help needed moving from lying on your back to sitting on the side of a flat bed without using bedrails?: A Lot Help needed moving to and from a bed to a chair (including a wheelchair)?: A Little Help needed standing up from a chair using your arms (e.g., wheelchair or bedside chair)?: A Little Help needed to walk in hospital room?: A Little Help needed climbing 3-5 steps with a railing? : Total 6 Click Score: 15    End of Session Equipment Utilized During Treatment: Gait belt Activity Tolerance: Patient limited by pain Patient left: with call bell/phone within reach;in chair Nurse Communication: Mobility status PT Visit Diagnosis: Other abnormalities of gait and mobility (R26.89);Muscle weakness (generalized) (M62.81);Pain Pain - Right/Left: Right Pain - part of body: Hip     Time: 0830-0912 PT Time Calculation (min) (ACUTE ONLY): 42 min  Charges:    $Therapeutic Activity: 38-52 mins PT General Charges $$ ACUTE PT VISIT: 1 Visit                     Aleck Daring, PT, DPT Acute Rehabilitation Services Office [phone]    Alayne ONEIDA Daring 08/27/2023, 10:15 AM

## 2023-08-28 DIAGNOSIS — M1611 Unilateral primary osteoarthritis, right hip: Secondary | ICD-10-CM | POA: Diagnosis not present

## 2023-08-28 DIAGNOSIS — Z79899 Other long term (current) drug therapy: Secondary | ICD-10-CM | POA: Diagnosis not present

## 2023-08-28 DIAGNOSIS — Z7901 Long term (current) use of anticoagulants: Secondary | ICD-10-CM | POA: Diagnosis not present

## 2023-08-28 DIAGNOSIS — Z86711 Personal history of pulmonary embolism: Secondary | ICD-10-CM | POA: Diagnosis not present

## 2023-08-28 DIAGNOSIS — Z8673 Personal history of transient ischemic attack (TIA), and cerebral infarction without residual deficits: Secondary | ICD-10-CM | POA: Diagnosis not present

## 2023-08-28 DIAGNOSIS — I2510 Atherosclerotic heart disease of native coronary artery without angina pectoris: Secondary | ICD-10-CM | POA: Diagnosis not present

## 2023-08-28 DIAGNOSIS — Z96642 Presence of left artificial hip joint: Secondary | ICD-10-CM | POA: Diagnosis not present

## 2023-08-28 DIAGNOSIS — Z87891 Personal history of nicotine dependence: Secondary | ICD-10-CM | POA: Diagnosis not present

## 2023-08-28 MED ORDER — OXYCODONE HCL 5 MG PO TABS: 5.0000 mg | ORAL_TABLET | Freq: Four times a day (QID) | ORAL | 0 refills | Status: DC | PRN

## 2023-08-28 MED ORDER — METHOCARBAMOL 500 MG PO TABS: 500.0000 mg | ORAL_TABLET | Freq: Four times a day (QID) | ORAL | 0 refills | Status: DC | PRN

## 2023-08-28 NOTE — Progress Notes (Signed)
 Physical Therapy Treatment Patient Details Name: Stephanie Parks MRN: [id number] DOB: [birth year] Today's Date: 08/28/2023   History of Present Illness 78 yo female admitted 7/8 for elective right direct anterior total hip arthroplasty. 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 Pt requiring min assist for transfers to standing and ambulating 50 ft with a walker with antalgic step to gait pattern. Pt negotiated 3 steps with a railing with significant time and effort. She is still requiring moderate assist for bed mobility, even after trialing a variety of techniques to increase independence. Pt states she will not have anyone to help her get her legs into and out of bed in the AM or PM as her son is a Naval architect and works long hours. Therefore, continue to recommend continued inpatient follow up therapy, <3 hours/day.     If plan is discharge home, recommend the following: A lot of help with bathing/dressing/bathroom;Assistance with cooking/housework;Assist for transportation;Help with stairs or ramp for entrance;A lot of help with walking and/or transfers   Can travel by private vehicle        Equipment Recommendations  None recommended by PT    Recommendations for Other Services       Precautions / Restrictions Precautions Precautions: Fall Precaution/Restrictions Comments: Direct anterior - no prec. Restrictions Weight Bearing Restrictions Per Provider Order: Yes RLE Weight Bearing Per Provider Order: Weight bearing as tolerated     Mobility  Bed Mobility Overal bed mobility: Needs Assistance Bed Mobility: Sit to Supine       Sit to  supine: Mod assist   General bed mobility comments: BLE assist back into bed despite trialing multiple methods i.e. hooking stronger foot under weaker, leg lifter strap    Transfers Overall transfer level: Needs assistance Equipment used: Rolling walker (2 wheels) Transfers: Sit to/from Stand Sit to Stand: Min assist           General transfer comment: Significantly increased time to initiate movement, cues for nose over toes, MinA to boost up from chair and toilet    Ambulation/Gait Ambulation/Gait assistance: Contact guard assist Gait Distance (Feet): 50 Feet Assistive device: Rolling walker (2 wheels) Gait Pattern/deviations: Step-to pattern, Decreased dorsiflexion - right, Antalgic, Decreased weight shift to right Gait velocity: decreased Gait velocity interpretation: <1.31 ft/sec, indicative of household ambulator   General Gait Details: Verbal cues for R foot neutral positioning, walker proximity, upright posture/upward gaze, increased R step length   Stairs Stairs: Yes Stairs assistance: Min assist Stair Management: One rail Left, Sideways Number of Stairs: 3 General stair comments: cues for sequencing/technique, minA for boost   Wheelchair Mobility     Tilt Bed    Modified Rankin (Stroke Patients Only)       Balance Overall balance assessment: Needs assistance Sitting-balance support: No upper extremity supported, Feet supported Sitting balance-Leahy Scale: Fair     Standing balance support: Bilateral upper extremity supported, Reliant on assistive device for balance Standing balance-Leahy Scale: Poor                              Communication Communication Communication: No apparent difficulties  Cognition Arousal: Alert Behavior During Therapy: WFL for tasks assessed/performed   PT - Cognitive impairments: Initiation, Sequencing, Problem solving, Memory, Awareness                         Following commands: Intact       Cueing Cueing Techniques: Verbal cues  Exercises      General Comments        Pertinent Vitals/Pain Pain Assessment Pain Assessment: Faces Faces Pain Scale: Hurts even more Pain Location: R hip Pain Descriptors / Indicators: Sore, Operative site guarding, Grimacing Pain Intervention(s): Monitored during session    Home Living                          Prior Function            PT Goals (current goals can now be found in the care plan section) Acute Rehab PT Goals Patient Stated Goal: to return to independence PT Goal Formulation: With patient Time For Goal Achievement: 08/30/23 Potential to Achieve Goals: Good Progress towards PT goals: Progressing toward goals    Frequency    7X/week      PT Plan      Co-evaluation              AM-PAC PT 6 Clicks Mobility   Outcome Measure  Help needed turning from your back to your side while in a flat bed without using bedrails?: A Little Help needed moving from lying on your back to sitting on the side of a flat bed without using bedrails?: A Lot Help needed moving to and from a bed to a chair (including a wheelchair)?: A Little Help needed standing up from a chair using your arms (e.g., wheelchair or bedside chair)?: A Little Help needed to walk in hospital room?: A Little Help needed climbing 3-5 steps with a railing? : A Little 6 Click Score: 17    End of Session Equipment Utilized During Treatment: Gait belt Activity Tolerance: Patient limited by pain Patient left: with call bell/phone within reach;in chair Nurse Communication: Mobility status PT Visit Diagnosis: Other abnormalities of gait and mobility (R26.89);Muscle weakness (generalized) (M62.81);Pain Pain - Right/Left: Right Pain - part of body: Hip     Time: 9156-9076 PT Time Calculation (min) (ACUTE ONLY): 40 min  Charges:    $Gait Training: 8-22 mins $Therapeutic Activity: 23-37 mins PT General Charges $$ ACUTE PT VISIT: 1  Visit                     Stephanie Parks, PT, DPT Acute Rehabilitation Services Office [phone]    Stephanie Parks 08/28/2023, 10:16 AM

## 2023-08-28 NOTE — TOC Initial Note (Signed)
 Transition of Care [facility]) - Initial/Assessment Note    Patient Details  Name: Stephanie Parks MRN: [id number] Date of Birth: [birth year]  Transition of Care [facility]) CM/SW Contact:    Andrez JULIANNA George, RN Phone Number: 08/28/2023, 3:15 PM  Clinical Narrative:                  Yesterday son and pt decided she will need some rehab  before home as son works out of the home. Pt agreeable to being faxed out. CM provided her bed offers this am and she asked that I also reach out to her son. CM has called the patients son twice without a call back. CM has left HIPAA appropriate voicemail.  TOC will follow. She will need insurance auth once facility selected.   Expected Discharge Plan: Skilled Nursing Facility Barriers to Discharge: Continued Medical Work up   Patient Goals and CMS Choice   CMS Medicare.gov Compare Post Acute Care list provided to:: Patient Choice offered to / list presented to : Patient, Adult Children      Expected Discharge Plan and Services In-house Referral: Clinical Social Work Discharge Planning Services: CM Consult   Living arrangements for the past 2 months: Single Family Home                                      Prior Living Arrangements/Services Living arrangements for the past 2 months: Single Family Home   Patient language and need for interpreter reviewed:: Yes Do you feel safe going back to the place where you live?: Yes        Care giver support system in place?: No (comment)   Criminal Activity/Legal Involvement Pertinent to Current Situation/Hospitalization: No - Comment as needed  Activities of Daily Living      Permission Sought/Granted                  Emotional Assessment Appearance:: Appears stated age Attitude/Demeanor/Rapport: Engaged Affect (typically observed): Accepting Orientation: : Oriented to Self, Oriented to Place, Oriented to  Time, Oriented to Situation   Psych Involvement: No (comment)  Admission  diagnosis:  Status post total replacement of right hip [Z96.641] Patient Active Problem List   Diagnosis Date Noted   Status post total replacement of right hip 08/26/2023   Unilateral primary osteoarthritis, right knee 07/02/2023   Multiple subsegmental pulmonary emboli without acute cor pulmonale (HCC) 07/23/2022   TIA (transient ischemic attack) 06/08/2022   Diastolic dysfunction 06/08/2022   Obesity (BMI 30-39.9) 06/08/2022   OSA (obstructive sleep apnea) 06/08/2022   Unilateral primary osteoarthritis, right hip 05/29/2022   Status post total replacement of left hip 09/04/2021   DJD (degenerative joint disease) 09/04/2021   Iron deficiency anemia 03/13/2021   Erythropoietin  deficiency anemia 03/13/2021   Greater trochanteric bursitis of left hip 10/18/2020   Headache 06/07/2020   Acute respiratory failure with hypoxia (HCC) 05/02/2020   Post-COVID chronic dyspnea 05/02/2020   Pulsatile neck mass 04/26/2020   Pneumonia due to COVID-19 virus 03/12/2020   Headache disorder 01/04/2020   Seasonal allergic conjunctivitis 10/04/2019   Dysfunction of right eustachian tube 10/04/2019   Pre-diabetes 06/02/2019   Seasonal and perennial allergic rhinitis 12/22/2018   Heart palpitations 12/22/2018   History of chest pain 07/20/2018   Brain aneurysm 07/20/2018   Dyslipidemia 05/04/2018   Tendonitis, Achilles, right 12/08/2017   Unilateral primary osteoarthritis, left knee 10/07/2017  Ingrown toenail 08/10/2017   Coughing 04/24/2017   CAD (coronary artery disease) 03/01/2015   Aneurysm (HCC) 03/01/2015   Dizziness 03/01/2015   Hypokalemia 03/01/2015   Paresthesia of arm 03/01/2015   Achalasia 09/28/2012   Constipation 09/28/2012   Cephalalgia 08/24/2012   Essential hypertension 04/19/2011   Hyperlipidemia 04/19/2011   GERD (gastroesophageal reflux disease) 04/19/2011   Chest pain 04/04/2011   PCP:  Watt Harlene BROCKS, MD Pharmacy:   [facility] [address], [place] - [address] [place] [address] Phone: [phone] Fax: [phone]  [facility] [address] [place] [address] Phone: [phone] Fax: [phone]  [facility] [place] [facility] [address] [place] [address] Phone: [phone] Fax: [phone]  [id number] - [place], [place] - [address] AT [facility] OF [address] [place] [address] Phone: [phone] Fax: [phone]  [id number] - [place], [place] - [address] AT [facility] [address] [place] [address] Phone: [phone] Fax: [phone]     Social Drivers of Health (SDOH) Social History: SDOH Screenings   Food Insecurity: Food Insecurity Present (08/11/2023)  Housing: Unknown (08/11/2023)  Transportation Needs: No Transportation Needs (08/11/2023)  Utilities: Not At Risk (03/25/2023)  Alcohol  Screen: Low Risk  (03/25/2023)  Depression (PHQ2-9): Low Risk  (03/25/2023)  Financial Resource Strain: Low Risk  (08/11/2023)  Physical Activity: Inactive (08/11/2023)  Social Connections: Moderately Integrated (08/11/2023)  Stress: No Stress Concern Present (08/11/2023)  Tobacco Use: Medium Risk (08/26/2023)  Health Literacy: Adequate Health Literacy (03/25/2023)   SDOH Interventions:     Readmission Risk Interventions     No data to display

## 2023-08-28 NOTE — Progress Notes (Signed)
 Physical Therapy Treatment Patient Details Name: Stephanie Parks MRN: [id number] DOB: [birth year] Today's Date: 08/28/2023   History of Present Illness 78 yo female admitted 7/8 for elective right direct anterior total hip arthroplasty. PMHx:  Covid PNA, PE, Schatzki's ring, migraines, chest pain, diverticulosis, achalasia, anemia, R ICA aneurysm    PT Comments  Pt progressing towards her physical therapy goals this session. Received sitting up in chair, requiring minA to power up to standing and ambulating 80 ft with a RW with slowed speed. Consistent cueing provided for safe walker use. As pt continues to require assist for bed mobility and transfers and does not have available daytime caregiver support - recommend continued inpatient follow up therapy, <3 hours/day.     If plan is discharge home, recommend the following: A lot of help with bathing/dressing/bathroom;Assistance with cooking/housework;Assist for transportation;Help with stairs or ramp for entrance;A lot of help with walking and/or transfers   Can travel by private vehicle        Equipment Recommendations  None recommended by PT    Recommendations for Other Services       Precautions / Restrictions Precautions Precautions: Fall Precaution/Restrictions Comments: Direct anterior - no prec. Restrictions Weight Bearing Restrictions Per Provider Order: Yes RLE Weight Bearing Per Provider Order: Weight bearing as tolerated     Mobility  Bed Mobility Overal bed mobility: Needs Assistance Bed Mobility: Sit to Supine       Sit to supine: Mod assist   General bed mobility comments: OOB in chair    Transfers Overall transfer level: Needs assistance Equipment used: Rolling walker (2 wheels) Transfers: Sit to/from Stand Sit to Stand: Min assist           General transfer comment: Light minA to boost to standing from chair, increased time/effort    Ambulation/Gait Ambulation/Gait assistance: Contact guard  assist Gait Distance (Feet): 80 Feet Assistive device: Rolling walker (2 wheels) Gait Pattern/deviations: Step-to pattern, Decreased dorsiflexion - right, Antalgic, Decreased weight shift to right Gait velocity: decreased Gait velocity interpretation: <1.31 ft/sec, indicative of household ambulator   General Gait Details: Verbal cues for R foot neutral positioning, walker proximity, upright posture/upward gaze, increased R step length, increased pace   Stairs Stairs: Yes Stairs assistance: Min assist Stair Management: One rail Left, Sideways Number of Stairs: 3 General stair comments: cues for sequencing/technique, minA for boost   Wheelchair Mobility     Tilt Bed    Modified Rankin (Stroke Patients Only)       Balance Overall balance assessment: Needs assistance Sitting-balance support: No upper extremity supported, Feet supported Sitting balance-Leahy Scale: Fair     Standing balance support: Bilateral upper extremity supported, Reliant on assistive device for balance Standing balance-Leahy Scale: Poor                              Communication Communication Communication: No apparent difficulties  Cognition Arousal: Alert Behavior During Therapy: WFL for tasks assessed/performed   PT - Cognitive impairments: Initiation, Sequencing, Problem solving, Memory, Awareness                         Following commands: Intact      Cueing Cueing Techniques: Verbal cues  Exercises      General Comments General comments (skin integrity, edema, etc.): VSS      Pertinent Vitals/Pain Pain Assessment Pain Assessment: Faces Faces Pain Scale: Hurts even more Pain Location:  R hip Pain Descriptors / Indicators: Sore, Operative site guarding, Grimacing Pain Intervention(s): Monitored during session    Home Living Family/patient expects to be discharged to:: Private residence Living Arrangements: Children Available Help at Discharge:  Family;Available PRN/intermittently Type of Home: House Home Access: Stairs to enter Entrance Stairs-Rails: Left Entrance Stairs-Number of Steps: 6   Home Layout: One level Home Equipment: Cane - single Information systems manager (2 wheels) Additional Comments: lives with son, he works full time as a Nurse, adult            PT Goals (current goals can now be found in the care plan section) Acute Rehab PT Goals Patient Stated Goal: to return to independence PT Goal Formulation: With patient Time For Goal Achievement: 08/30/23 Potential to Achieve Goals: Good Progress towards PT goals: Progressing toward goals    Frequency    7X/week      PT Plan      Co-evaluation              AM-PAC PT 6 Clicks Mobility   Outcome Measure  Help needed turning from your back to your side while in a flat bed without using bedrails?: A Little Help needed moving from lying on your back to sitting on the side of a flat bed without using bedrails?: A Lot Help needed moving to and from a bed to a chair (including a wheelchair)?: A Little Help needed standing up from a chair using your arms (e.g., wheelchair or bedside chair)?: A Little Help needed to walk in hospital room?: A Little Help needed climbing 3-5 steps with a railing? : A Little 6 Click Score: 17    End of Session Equipment Utilized During Treatment: Gait belt Activity Tolerance: Patient limited by pain Patient left: with call bell/phone within reach;in chair Nurse Communication: Mobility status PT Visit Diagnosis: Other abnormalities of gait and mobility (R26.89);Muscle weakness (generalized) (M62.81);Pain Pain - Right/Left: Right Pain - part of body: Hip     Time: 8575-8553 PT Time Calculation (min) (ACUTE ONLY): 22 min  Charges:    $Gait Training: 8-22 mins PT General Charges $$ ACUTE PT VISIT: 1 Visit                     [name], PT, DPT Acute Rehabilitation Services Office  [phone]    Stephanie Parks 08/28/2023, 4:17 PM

## 2023-08-28 NOTE — Progress Notes (Signed)
 Patient ID: Stephanie Parks, female   DOB: 06/29/1945, 78 y.o.   MRN: [id number] The patient is awake and alert.  She does feel better today than she did the day before.  Her vital signs are stable and her labs are stable.  However, she continues to make minimal progress with her mobility and will unfortunately likely end up needing short-term skilled nursing placement.  No one is with her during the daytime and she does have a son but he does work.  Her right hip is otherwise stable and the dressing is clean and dry.  An FL 2 has been signed.

## 2023-08-28 NOTE — Evaluation (Signed)
 Occupational Therapy Evaluation Patient Details Name: Stephanie Parks MRN: [id number] DOB: 02/18/1946 Today's Date: 08/28/2023   History of Present Illness   78 yo female admitted 7/8 for elective right direct anterior total hip arthroplasty. PMHx:  Covid PNA, PE, Schatzki's ring, migraines, chest pain, diverticulosis, achalasia, anemia, R ICA aneurysm     Clinical Impressions Stephanie Parks was evaluated s/p the above admission list. She is indep at baseline. Upon evaluation the pt was limited by R hip pain, limited RLE ROM, unsteady gait and limited activity tolerance. Overall she required close CGA for transfers with RW and functional mobility within the room and bathroom. Pt benefited from cues for safe RW management. Due to the deficits listed below the pt also needs set up A for UB ADLs in sitting and up to max A for LB ADLs. Pt will likely benefit from AE education to increase safety and indep with LB ADLs. Pt will benefit from continued acute OT services and skilled inpatient follow up therapy, <3 hours/day.       If plan is discharge home, recommend the following:   A little help with walking and/or transfers;A lot of help with bathing/dressing/bathroom;Assistance with cooking/housework;Assist for transportation;Help with stairs or ramp for entrance     Functional Status Assessment   Patient has had a recent decline in their functional status and demonstrates the ability to make significant improvements in function in a reasonable and predictable amount of time.     Equipment Recommendations   None recommended by OT      Precautions/Restrictions   Precautions Precautions: Fall Precaution/Restrictions Comments: Direct anterior - no prec. Restrictions Weight Bearing Restrictions Per Provider Order: Yes RLE Weight Bearing Per Provider Order: Weight bearing as tolerated     Mobility Bed Mobility Overal bed mobility: Needs Assistance             General bed  mobility comments: OOB on arrival    Transfers Overall transfer level: Needs assistance Equipment used: Rolling walker (2 wheels) Transfers: Sit to/from Stand Sit to Stand: Contact guard assist           General transfer comment: increased time required, cues for hand placement. Close CGA for power up from chair, no physical assist given.      Balance Overall balance assessment: Needs assistance Sitting-balance support: No upper extremity supported, Feet supported Sitting balance-Leahy Scale: Fair     Standing balance support: Single extremity supported, During functional activity Standing balance-Leahy Scale: Poor Standing balance comment: stood at sink for grooming, required L forearm support on sink during task                           ADL either performed or assessed with clinical judgement   ADL Overall ADL's : Needs assistance/impaired Eating/Feeding: Independent;Sitting   Grooming: Contact guard assist;Standing Grooming Details (indicate cue type and reason): at the sink Upper Body Bathing: Set up;Sitting   Lower Body Bathing: Moderate assistance;Sit to/from stand   Upper Body Dressing : Set up;Sitting   Lower Body Dressing: Moderate assistance;Sit to/from stand   Toilet Transfer: Contact guard assist;Ambulation;Rolling walker (2 wheels)   Toileting- Clothing Manipulation and Hygiene: Contact guard assist;Sitting/lateral lean       Functional mobility during ADLs: Contact guard assist;Rolling walker (2 wheels) General ADL Comments: increased time required for all tasks     Vision Baseline Vision/History: 1 Wears glasses Vision Assessment?: No apparent visual deficits     Perception Perception: Not  tested       Praxis Praxis: Not tested       Pertinent Vitals/Pain Pain Assessment Pain Assessment: Faces Faces Pain Scale: Hurts even more Pain Location: R hip Pain Descriptors / Indicators: Sore, Operative site guarding,  Grimacing Pain Intervention(s): Limited activity within patient's tolerance, Monitored during session     Extremity/Trunk Assessment Upper Extremity Assessment Upper Extremity Assessment: Overall WFL for tasks assessed   Lower Extremity Assessment Lower Extremity Assessment: Defer to PT evaluation   Cervical / Trunk Assessment Cervical / Trunk Assessment: Normal   Communication Communication Communication: No apparent difficulties   Cognition Arousal: Alert Behavior During Therapy: Flat affect Cognition: No apparent impairments             OT - Cognition Comments: slowed processing, flat                 Following commands: Intact       Cueing  General Comments   Cueing Techniques: Verbal cues  VSS           Home Living Family/patient expects to be discharged to:: Private residence Living Arrangements: Children Available Help at Discharge: Family;Available PRN/intermittently Type of Home: House Home Access: Stairs to enter [employer] of Steps: 6 Entrance Stairs-Rails: Left Home Layout: One level     Bathroom Shower/Tub: Chief Strategy Officer: Standard     Home Equipment: Cane - single Information systems manager (2 wheels)   Additional Comments: lives with son, he works full time as a Gaffer Prior Level of Function : Independent/Modified Independent             Mobility Comments: Uses RW for mobility      OT Problem List: Decreased strength;Decreased range of motion;Decreased activity tolerance;Impaired balance (sitting and/or standing);Decreased safety awareness;Decreased knowledge of use of DME or AE;Decreased knowledge of precautions;Pain   OT Treatment/Interventions: Self-care/ADL training;Therapeutic exercise;DME and/or AE instruction;Therapeutic activities;Patient/family education;Balance training      OT Goals(Current goals can be found in the care plan  section)   Acute Rehab OT Goals Patient Stated Goal: to get better OT Goal Formulation: With patient Time For Goal Achievement: 09/11/23 Potential to Achieve Goals: Good ADL Goals Pt Will Perform Grooming: standing;with modified independence Pt Will Perform Lower Body Bathing: with contact guard assist;sit to/from stand;with adaptive equipment Pt Will Perform Lower Body Dressing: with contact guard assist;sit to/from stand;with adaptive equipment Pt Will Transfer to Toilet: with modified independence;ambulating   OT Frequency:  Min 2X/week       AM-PAC OT 6 Clicks Daily Activity     Outcome Measure Help from another person eating meals?: None Help from another person taking care of personal grooming?: A Little Help from another person toileting, which includes using toliet, bedpan, or urinal?: A Little Help from another person bathing (including washing, rinsing, drying)?: A Lot Help from another person to put on and taking off regular upper body clothing?: A Little Help from another person to put on and taking off regular lower body clothing?: A Lot 6 Click Score: 17   End of Session Equipment Utilized During Treatment: Rolling walker (2 wheels) Nurse Communication: Mobility status  Activity Tolerance: Patient tolerated treatment well Patient left: in chair;with call bell/phone within reach  OT Visit Diagnosis: Unsteadiness on feet (R26.81);Other abnormalities of gait and mobility (R26.89);Muscle weakness (generalized) (M62.81);Pain                Time: 8653-8594 OT Time  Calculation (min): 19 min Charges:  OT General Charges $OT Visit: 1 Visit OT Evaluation $OT Eval Moderate Complexity: 1 Mod  Stephanie Parks, OTR/L Acute Rehabilitation Services Office [phone] Secure Chat Communication Preferred   Stephanie Parks 08/28/2023, 2:53 PM

## 2023-08-29 ENCOUNTER — Other Ambulatory Visit: Payer: Self-pay | Admitting: Family Medicine

## 2023-08-29 DIAGNOSIS — Z79899 Other long term (current) drug therapy: Secondary | ICD-10-CM | POA: Diagnosis not present

## 2023-08-29 DIAGNOSIS — Z86711 Personal history of pulmonary embolism: Secondary | ICD-10-CM | POA: Diagnosis not present

## 2023-08-29 DIAGNOSIS — Z96642 Presence of left artificial hip joint: Secondary | ICD-10-CM | POA: Diagnosis not present

## 2023-08-29 DIAGNOSIS — M1990 Unspecified osteoarthritis, unspecified site: Secondary | ICD-10-CM | POA: Diagnosis not present

## 2023-08-29 DIAGNOSIS — Z8673 Personal history of transient ischemic attack (TIA), and cerebral infarction without residual deficits: Secondary | ICD-10-CM | POA: Diagnosis not present

## 2023-08-29 DIAGNOSIS — Z743 Need for continuous supervision: Secondary | ICD-10-CM | POA: Diagnosis not present

## 2023-08-29 DIAGNOSIS — M1611 Unilateral primary osteoarthritis, right hip: Secondary | ICD-10-CM | POA: Diagnosis not present

## 2023-08-29 DIAGNOSIS — Z7901 Long term (current) use of anticoagulants: Secondary | ICD-10-CM | POA: Diagnosis not present

## 2023-08-29 DIAGNOSIS — Z471 Aftercare following joint replacement surgery: Secondary | ICD-10-CM | POA: Diagnosis not present

## 2023-08-29 DIAGNOSIS — I2510 Atherosclerotic heart disease of native coronary artery without angina pectoris: Secondary | ICD-10-CM | POA: Diagnosis not present

## 2023-08-29 DIAGNOSIS — Z7401 Bed confinement status: Secondary | ICD-10-CM | POA: Diagnosis not present

## 2023-08-29 DIAGNOSIS — R531 Weakness: Secondary | ICD-10-CM | POA: Diagnosis not present

## 2023-08-29 DIAGNOSIS — M6281 Muscle weakness (generalized): Secondary | ICD-10-CM | POA: Diagnosis not present

## 2023-08-29 DIAGNOSIS — Z96641 Presence of right artificial hip joint: Secondary | ICD-10-CM | POA: Diagnosis not present

## 2023-08-29 DIAGNOSIS — I2699 Other pulmonary embolism without acute cor pulmonale: Secondary | ICD-10-CM

## 2023-08-29 DIAGNOSIS — M6259 Muscle wasting and atrophy, not elsewhere classified, multiple sites: Secondary | ICD-10-CM | POA: Diagnosis not present

## 2023-08-29 DIAGNOSIS — T8484XD Pain due to internal orthopedic prosthetic devices, implants and grafts, subsequent encounter: Secondary | ICD-10-CM | POA: Diagnosis not present

## 2023-08-29 DIAGNOSIS — Z87891 Personal history of nicotine dependence: Secondary | ICD-10-CM | POA: Diagnosis not present

## 2023-08-29 DIAGNOSIS — R262 Difficulty in walking, not elsewhere classified: Secondary | ICD-10-CM | POA: Diagnosis not present

## 2023-08-29 NOTE — Progress Notes (Signed)
 Patient alert and oriented, mae's well, voiding adequate amount of urine, swallowing without difficulty, no c/o pain at time of discharge. Script and discharged instructions given to patient.  Room was checked and accounted for all patient's belongings; discharge instructions concerning her medications, incision care, follow up appointment and when to call the doctor as needed were all discussed with patient by RN and he expressed understanding on the instructions given

## 2023-08-29 NOTE — Progress Notes (Signed)
 Patient being discharged to [facility] [place]  Patient to be transported by [facility].  IV removed with the catheter intact.  Discharge instructions and prescription information placed in the packet for discharge.  Report called to receiving Nurse at [facility].Stephanie Parks

## 2023-08-29 NOTE — Progress Notes (Signed)
 Subjective: 3 Days Post-Op Procedure(s) (LRB): ARTHROPLASTY, HIP, TOTAL, ANTERIOR APPROACH (Right) Patient reports pain as moderate.  Slow mobility with PT and short-term SNF has been recommended  Objective: Vital signs in last 24 hours: Temp:  [98.4 F (36.9 C)-99 F (37.2 C)] 98.8 F (37.1 C) (07/11 0342) Pulse Rate:  [66-102] 66 (07/11 0342) Resp:  [16-20] 20 (07/11 0342) BP: (99-113)/(45-57) 110/48 (07/11 0342) SpO2:  [93 %-100 %] 100 % (07/11 0342)  Intake/Output from previous day: 07/10 0701 - 07/11 0700 In: 720 [P.O.:720] Out: -  Intake/Output this shift: Total I/O In: 480 [P.O.:480] Out: -   Recent Labs    08/27/23 0956  HGB 11.3*   Recent Labs    08/27/23 0956  WBC 13.8*  RBC 3.87  HCT 35.3*  PLT 257   Recent Labs    08/27/23 0956  NA 134*  K 3.9  CL 95*  CO2 24  BUN 7*  CREATININE 0.90  GLUCOSE 133*  CALCIUM  9.2   No results for input(s): LABPT, INR in the last 72 hours.  Sensation intact distally Intact pulses distally Dorsiflexion/Plantar flexion intact Incision: dressing C/D/I   Assessment/Plan: 3 Days Post-Op Procedure(s) (LRB): ARTHROPLASTY, HIP, TOTAL, ANTERIOR APPROACH (Right) Up with therapy Discharge to SNF      [name] 08/29/2023, 6:41 AM

## 2023-08-29 NOTE — TOC Progression Note (Addendum)
 Transition of Care [facility]) - Progression Note    Patient Details  Name: Stephanie Parks MRN: [id number] Date of Birth: 1945-10-16  Transition of Care [facility]) CM/SW Contact  Andrez JULIANNA George, RN Phone Number: 08/29/2023, 12:08 PM  Clinical Narrative:     Pt and son selected [facility]. CM has asked TOC MOA to begin insurance authorization for a rehab stay.   [name] states pt has a pending Workers Education officer, environmental. CM met with the patient and she says its from 2019 [employer]. CM has called [employer] cooperate and they have sent CM's information to who handles this for them, to call with the company that handles their Worker's comp. They have 2-3 days to respond. Pt states her hip surgery has nothing to do with the [facility] claim as that was from a fall and her back. She says nothing came of the claim. [name] has been updated and aware that [employer] will call CM next week. [name] needs number confirming the workers comp case is terminated.   TOC following.  Expected Discharge Plan: Skilled Nursing Facility Barriers to Discharge: Continued Medical Work up  Expected Discharge Plan and Services In-house Referral: Clinical Social Work Discharge Planning Services: CM Consult   Living arrangements for the past 2 months: Single Family Home Expected Discharge Date: 08/29/23                                     Social Determinants of Health (SDOH) Interventions SDOH Screenings   Food Insecurity: Food Insecurity Present (08/11/2023)  Housing: Unknown (08/11/2023)  Transportation Needs: No Transportation Needs (08/11/2023)  Utilities: Not At Risk (03/25/2023)  Alcohol  Screen: Low Risk  (03/25/2023)  Depression (PHQ2-9): Low Risk  (03/25/2023)  Financial Resource Strain: Low Risk  (08/11/2023)  Physical Activity: Inactive (08/11/2023)  Social Connections: Moderately Integrated (08/11/2023)  Stress: No Stress Concern Present (08/11/2023)  Tobacco Use: Medium Risk (08/26/2023)  Health Literacy: Adequate  Health Literacy (03/25/2023)    Readmission Risk Interventions     No data to display

## 2023-08-29 NOTE — Discharge Summary (Signed)
 Patient ID: Stephanie Parks MRN: [id number] DOB/AGE: Jul 10, 1945 78 y.o.  Admit date: 08/26/2023 Discharge date: 08/29/2023  Admission Diagnoses:  Principal Problem:   Unilateral primary osteoarthritis, right hip Active Problems:   Status post total replacement of right hip   Discharge Diagnoses:  Same  Past Medical History:  Diagnosis Date   Achalasia 09/28/2012   Allergic rhinitis 03/01/2015   Anemia    Aneurysm (HCC) 03/01/2015   stable 5 mm periopthalmic right ICA aneurysm 03/25/21 CTA   BP (high blood pressure) 03/01/2015   Bronchitis 04/23/2018   Cephalalgia 08/24/2012   Overview:  ICD-10 cut over     Chest pain 04/04/2011   Coronary Ca Score 0, no significant CAD 08/20/18; CP with troponin 4>32 s/p LHC showing normal coronaries 12/29/18   CN (constipation) 09/28/2012   Coronary artery disease    Coughing 04/24/2017   Decreased potassium in the blood 03/01/2015   Diverticulosis    Dizziness 03/01/2015   Erythropoietin  deficiency anemia 03/13/2021   GERD (gastroesophageal reflux disease)    Hiatal hernia    Hypercholesterolemia 03/01/2015   Hyperlipidemia    Hypertension    Ingrown toenail 08/10/2017   Inguinal hernia    Iron deficiency anemia 03/13/2021   Migraine headache    Onychomycosis 12/08/2017   Paresthesia of arm 03/01/2015   PE (pulmonary thromboembolism) (HCC) 03/13/2020   in setting of + COVID-19, s/p Eliquis  x 3 month (recurrent)   Schatzki's ring    Sleep apnea    uses CPAP   Tendonitis, Achilles, right 12/08/2017   TIA (transient ischemic attack)    Unilateral primary osteoarthritis, left knee 10/07/2017    Surgeries: Procedure(s): ARTHROPLASTY, HIP, TOTAL, ANTERIOR APPROACH on 08/26/2023   Consultants:   Discharged Condition: Improved  Hospital Course: DELARA SHEPHEARD is an 78 y.o. female who was admitted 08/26/2023 for operative treatment ofUnilateral primary osteoarthritis, right hip. Patient has severe unremitting pain that affects  sleep, daily activities, and work/hobbies. After pre-op clearance the patient was taken to the operating room on 08/26/2023 and underwent  Procedure(s): ARTHROPLASTY, HIP, TOTAL, ANTERIOR APPROACH.    Patient was given perioperative antibiotics:  Anti-infectives (From admission, onward)    Start     Dose/Rate Route Frequency Ordered Stop   08/26/23 1800  ceFAZolin  (ANCEF ) IVPB 2g/100 mL premix        2 g 200 mL/hr over 30 Minutes Intravenous Every 6 hours 08/26/23 1423 08/27/23 0040   08/26/23 0945  ceFAZolin  (ANCEF ) IVPB 2g/100 mL premix        2 g 200 mL/hr over 30 Minutes Intravenous On call to O.R. 08/26/23 0932 08/26/23 1203        Patient was given sequential compression devices, early ambulation, and chemoprophylaxis to prevent DVT.  Patient benefited maximally from hospital stay and there were no complications.    Recent vital signs: Patient Vitals for the past 24 hrs:  BP Temp Temp src Pulse Resp SpO2  08/29/23 0342 (!) 110/48 98.8 F (37.1 C) Oral 66 20 100 %  08/28/23 2334 (!) 105/49 99 F (37.2 C) Oral 97 18 100 %  08/28/23 2052 (!) 101/57 99 F (37.2 C) Oral (!) 102 20 93 %  08/28/23 1012 (!) 113/50 -- -- 79 16 100 %  08/28/23 0846 (!) 99/45 98.4 F (36.9 C) Oral 96 17 100 %     Recent laboratory studies:  Recent Labs    08/27/23 0956  WBC 13.8*  HGB 11.3*  HCT 35.3*  PLT 257  NA 134*  K 3.9  CL 95*  CO2 24  BUN 7*  CREATININE 0.90  GLUCOSE 133*  CALCIUM  9.2     Discharge Medications:   Allergies as of 08/29/2023       Reactions   Methylprednisolone  Other (See Comments)   Lightheaded and Hallucinations   Shellfish Allergy Hives, Swelling   Ace Inhibitors Other (See Comments)   Pt cannot recall her reaction but tolerates arb    Gabapentin  Other (See Comments)   headaches   Pitavastatin    Unknown reaction Other Reaction(s): Other (See Comments) ADVERSE REACTION,  Pt unaware of this reaction; states she has been on crestor  in the past with  no issues   Sulfa Antibiotics Other (See Comments), Rash   Fine bumps   Topiramate  Palpitations   Heart race,         Medication List     STOP taking these medications    traMADol  50 MG tablet Commonly known as: ULTRAM        TAKE these medications    albuterol  108 (90 Base) MCG/ACT inhaler Commonly known as: VENTOLIN  HFA Inhale 1-2 puffs into the lungs every 6 (six) hours as needed for wheezing or shortness of breath.   ALIVE MULTI-VITAMIN PO Take 1 tablet by mouth daily.   apixaban  5 MG Tabs tablet Commonly known as: Eliquis  Take 1 tablet (5 mg total) by mouth 2 (two) times daily.   B-12 PO Take 2 tablets by mouth daily.   BIOFREEZE EX Apply 1 Application topically daily as needed (pain).   CALCIUM  600 + D PO Take 1 tablet by mouth daily.   carboxymethylcellulose 0.5 % Soln Commonly known as: REFRESH PLUS Place 1 drop into both eyes 3 (three) times daily as needed (dry eyes).   carvedilol  6.25 MG tablet Commonly known as: COREG  Take 1 tablet (6.25 mg total) by mouth 2 (two) times daily with a meal.   ELDERBERRY PO Take 1 capsule by mouth daily.   EPINEPHrine  0.3 mg/0.3 mL Soaj injection Commonly known as: EpiPen  2-Pak Inject 0.3 mg into the muscle as needed for anaphylaxis. Per allergen immunotherapy protocol   fluticasone  50 MCG/ACT nasal spray Commonly known as: FLONASE  Place 2 sprays into both nostrils daily as needed for allergies or rhinitis.   losartan -hydrochlorothiazide  50-12.5 MG tablet Commonly known as: HYZAAR  Take 0.5 tablets by mouth daily.   methocarbamol  500 MG tablet Commonly known as: ROBAXIN  Take 1 tablet (500 mg total) by mouth every 6 (six) hours as needed for muscle spasms.   NON FORMULARY Pt uses a cpap nightly   nortriptyline  25 MG capsule Commonly known as: PAMELOR  Take 2 capsules (50 mg total) by mouth at bedtime.   omeprazole  40 MG capsule Commonly known as: PRILOSEC Take 1 capsule (40 mg total) by mouth daily.    oxyCODONE  5 MG immediate release tablet Commonly known as: Oxy IR/ROXICODONE  Take 1-2 tablets (5-10 mg total) by mouth every 6 (six) hours as needed for moderate pain (pain score 4-6) (pain score 4-6).   polyethylene glycol 17 g packet Commonly known as: MiraLax  Take 17 g by mouth daily as needed for mild constipation or moderate constipation.   potassium chloride  SA 20 MEQ tablet Commonly known as: Klor-Con  M20 Take 2 tablets (40 mEq total) by mouth daily.   pregabalin  75 MG capsule Commonly known as: LYRICA  TAKE 1 CAPSULE BY MOUTH TWICE A DAY   rosuvastatin  20 MG tablet Commonly known as: CRESTOR  Take 1 tablet (20 mg total) by  mouth daily.   spironolactone  25 MG tablet Commonly known as: ALDACTONE  Take 0.5 tablets (12.5 mg total) by mouth daily.   tobramycin  0.3 % ophthalmic solution Commonly known as: Tobrex  Place 2 drops into the right eye every 6 (six) hours.   Wegovy  0.5 MG/0.5ML Soaj Generic drug: Semaglutide -Weight Management Inject 0.5 mg into the skin once a week.               Durable Medical Equipment  (From admission, onward)           Start     Ordered   08/27/23 1451  For home use only DME standard manual wheelchair with seat cushion  Once       Comments: Patient suffers from weakness which impairs their ability to perform daily activities like bathing, dressing, and grooming in the home.  A walker will not resolve issue with performing activities of daily living. A wheelchair will allow patient to safely perform daily activities. Patient can safely propel the wheelchair in the home or has a caregiver who can provide assistance. Length of need 6 months . Accessories: elevating leg rests (ELRs), wheel locks, extensions and anti-tippers.   08/27/23 1451   08/26/23 1424  DME 3 n 1  Once        08/26/23 1423   08/26/23 1424  DME Walker rolling  Once       Question Answer Comment  Walker: With 5 Inch Wheels   Patient needs a walker to treat with  the following condition Status post total replacement of right hip      08/26/23 1423            Diagnostic Studies: DG Pelvis Portable Result Date: 08/26/2023 CLINICAL DATA:  Status post right hip replacement. EXAM: PORTABLE PELVIS 1-2 VIEWS COMPARISON:  None Available. FINDINGS: Right hip arthroplasty in expected alignment. No periprosthetic lucency or fracture. Recent postsurgical change includes air and edema in the soft tissues. Overlying skin staples in place. Previous left hip arthroplasty. IMPRESSION: Right hip arthroplasty without immediate postoperative complication. Electronically Signed   By: Andrea Gasman M.D.   On: 08/26/2023 15:12   DG HIP UNILAT WITH PELVIS 1V RIGHT Result Date: 08/26/2023 CLINICAL DATA:  461500 Elective surgery 461500; surgery EXAM: DG HIP (WITH OR WITHOUT PELVIS) 1V RIGHT; DG C-ARM 1-60 MIN-NO REPORT COMPARISON:  X-ray right hip 01/08/2023 FINDINGS: Intraoperative right hip total arthroplasty. Three low resolution intraoperative spot views of the right hip were obtained. No fracture visible on the limited views. Prior left total hip arthroplasty partially visualized. Total fluoroscopy time: 23 seconds Total radiation dose: 2.87 mGy IMPRESSION: Intraoperative right hip total arthroplasty. Electronically Signed   By: Morgane  Naveau M.D.   On: 08/26/2023 13:20   DG C-Arm 1-60 Min-No Report Result Date: 08/26/2023 Fluoroscopy was utilized by the requesting physician.  No radiographic interpretation.    Disposition: Discharge disposition: 03-Skilled Nursing Facility          Follow-up Information     [facility] Follow up.   Specialty: Home Health Services Why: [facility] will contact you for the first home visit Contact information: [address] [place] [address] [phone]         Vernetta Lonni GRADE, MD Follow up in 2 week(s).   Specialty: Orthopedic Surgery Contact information: [address] [place]  [place]  [address] [phone]         Independent living with disabilities Follow up.   Why: for ramp  [facility]--Phone: [phone] [facility]--[phone] Contact information: [phone]                 Signed: [name] 08/29/2023, 6:42 AM

## 2023-08-29 NOTE — Plan of Care (Signed)
   Problem: Education: Goal: Knowledge of General Education information will improve Description: Including pain rating scale, medication(s)/side effects and non-pharmacologic comfort measures Outcome: Completed/Met   Problem: Health Behavior/Discharge Planning: Goal: Ability to manage health-related needs will improve Outcome: Completed/Met   Problem: Clinical Measurements: Goal: Ability to maintain clinical measurements within normal limits will improve Outcome: Completed/Met Goal: Will remain free from infection Outcome: Completed/Met Goal: Diagnostic test results will improve Outcome: Completed/Met Goal: Respiratory complications will improve Outcome: Completed/Met Goal: Cardiovascular complication will be avoided Outcome: Completed/Met   Problem: Activity: Goal: Risk for activity intolerance will decrease Outcome: Completed/Met   Problem: Nutrition: Goal: Adequate nutrition will be maintained Outcome: Completed/Met   Problem: Coping: Goal: Level of anxiety will decrease Outcome: Completed/Met   Problem: Elimination: Goal: Will not experience complications related to bowel motility Outcome: Completed/Met Goal: Will not experience complications related to urinary retention Outcome: Completed/Met   Problem: Pain Managment: Goal: General experience of comfort will improve and/or be controlled Outcome: Completed/Met   Problem: Safety: Goal: Ability to remain free from injury will improve Outcome: Completed/Met   Problem: Skin Integrity: Goal: Risk for impaired skin integrity will decrease Outcome: Completed/Met   Problem: Education: Goal: Knowledge of the prescribed therapeutic regimen will improve Outcome: Completed/Met Goal: Understanding of discharge needs will improve Outcome: Completed/Met Goal: Individualized Educational Video(s) Outcome: Completed/Met   Problem: Activity: Goal: Ability to avoid complications of mobility impairment will  improve Outcome: Completed/Met Goal: Ability to tolerate increased activity will improve Outcome: Completed/Met   Problem: Clinical Measurements: Goal: Postoperative complications will be avoided or minimized Outcome: Completed/Met   Problem: Pain Management: Goal: Pain level will decrease with appropriate interventions Outcome: Completed/Met   Problem: Skin Integrity: Goal: Will show signs of wound healing Outcome: Completed/Met

## 2023-08-29 NOTE — TOC Transition Note (Addendum)
 Transition of Care [facility]) - Discharge Note   Patient Details  Name: Stephanie Parks MRN: [id number] Date of Birth: [birth year]  Transition of Care [facility]) CM/SW Contact:  Andrez JULIANNA George, RN Phone Number: 08/29/2023, 3:02 PM   Clinical Narrative:     approved 7/11 - 7/15 NRD 7/15 Plan Auth PI#[id number]   Pt is discharging to [facility] today. She has received Therapist, occupational through [facility]. She will transport via [facility].   Room: 601 Number for report: [phone]  Final next level of care: Skilled Nursing Facility Barriers to Discharge: No Barriers Identified   Patient Goals and CMS Choice   CMS Medicare.gov Compare Post Acute Care list provided to:: Patient Choice offered to / list presented to : Patient, Adult Children      Discharge Placement                       Discharge Plan and Services Additional resources added to the After Visit Summary for   In-house Referral: Clinical Social Work Discharge Planning Services: CM Consult                                 Social Drivers of Health (SDOH) Interventions SDOH Screenings   Food Insecurity: Food Insecurity Present (08/11/2023)  Housing: Unknown (08/11/2023)  Transportation Needs: No Transportation Needs (08/11/2023)  Utilities: Not At Risk (03/25/2023)  Alcohol  Screen: Low Risk  (03/25/2023)  Depression (PHQ2-9): Low Risk  (03/25/2023)  Financial Resource Strain: Low Risk  (08/11/2023)  Physical Activity: Inactive (08/11/2023)  Social Connections: Moderately Integrated (08/11/2023)  Stress: No Stress Concern Present (08/11/2023)  Tobacco Use: Medium Risk (08/26/2023)  Health Literacy: Adequate Health Literacy (03/25/2023)     Readmission Risk Interventions     No data to display

## 2023-08-29 NOTE — Progress Notes (Signed)
 Physical Therapy Treatment Patient Details Name: Stephanie Parks MRN: [id number] DOB: 01-Jul-1945 Today's Date: 08/29/2023   History of Present Illness 78 yo female admitted 7/8 for elective right direct anterior total hip arthroplasty. PMHx:  Covid PNA, PE, Schatzki's ring, migraines, chest pain, diverticulosis, achalasia, anemia, R ICA aneurysm    PT Comments  Pt progressing towards her physical therapy goals, with improving RLE strength and advancement during gait. Pt received sitting up in chair. TotalA for LB dressing (donning shoes). Ambulating limited hallway distances with a RW, with slowed step to gait pattern. Mod-heavy reliance through arms on walker and required several short standing rest breaks due to upper extremity fatigue and soreness. As she has limited caregiver support at home, will benefit from continued inpatient follow up therapy, <3 hours/day to address deficits and maximize functional mobility.   If plan is discharge home, recommend the following: A lot of help with bathing/dressing/bathroom;Assistance with cooking/housework;Assist for transportation;Help with stairs or ramp for entrance;A lot of help with walking and/or transfers   Can travel by private vehicle        Equipment Recommendations  None recommended by PT    Recommendations for Other Services       Precautions / Restrictions Precautions Precautions: Fall Precaution/Restrictions Comments: Direct anterior - no prec. Restrictions Weight Bearing Restrictions Per Provider Order: Yes RLE Weight Bearing Per Provider Order: Weight bearing as tolerated     Mobility  Bed Mobility               General bed mobility comments: OOB in chair    Transfers Overall transfer level: Needs assistance Equipment used: Rolling walker (2 wheels) Transfers: Sit to/from Stand Sit to Stand: Contact guard assist           General transfer comment: Increased time/effort     Ambulation/Gait Ambulation/Gait assistance: Contact guard assist Gait Distance (Feet): 100 Feet Assistive device: Rolling walker (2 wheels) Gait Pattern/deviations: Step-to pattern, Decreased dorsiflexion - right, Antalgic, Decreased weight shift to right Gait velocity: decreased Gait velocity interpretation: <1.31 ft/sec, indicative of household ambulator   General Gait Details: Improved R heel strike and foot clearance this session, verbal cueing provided for upward gaze and posture. Moderate reliance through arms on walker, required several short standing rest breaks. Slightly improved pace, but still slow overall.   Stairs             Wheelchair Mobility     Tilt Bed    Modified Rankin (Stroke Patients Only)       Balance Overall balance assessment: Needs assistance Sitting-balance support: No upper extremity supported, Feet supported Sitting balance-Leahy Scale: Fair     Standing balance support: Bilateral upper extremity supported, Reliant on assistive device for balance Standing balance-Leahy Scale: Poor                              Communication Communication Communication: No apparent difficulties  Cognition Arousal: Alert Behavior During Therapy: WFL for tasks assessed/performed   PT - Cognitive impairments: No apparent impairments                         Following commands: Intact      Cueing Cueing Techniques: Verbal cues  Exercises Total Joint Exercises Hip ABduction/ADduction: AROM, Both, 10 reps, Seated Long Arc Quad: AROM, Both, 10 reps, Seated    General Comments        Pertinent Vitals/Pain Pain  Assessment Pain Assessment: Faces Faces Pain Scale: Hurts little more Pain Location: R hip Pain Descriptors / Indicators: Sore, Operative site guarding, Grimacing Pain Intervention(s): Monitored during session    Home Living                          Prior Function            PT Goals (current  goals can now be found in the care plan section) Acute Rehab PT Goals Patient Stated Goal: to return to independence PT Goal Formulation: With patient Time For Goal Achievement: 08/30/23 Potential to Achieve Goals: Good Progress towards PT goals: Progressing toward goals    Frequency    7X/week      PT Plan      Co-evaluation              AM-PAC PT 6 Clicks Mobility   Outcome Measure  Help needed turning from your back to your side while in a flat bed without using bedrails?: A Little Help needed moving from lying on your back to sitting on the side of a flat bed without using bedrails?: A Lot Help needed moving to and from a bed to a chair (including a wheelchair)?: A Little Help needed standing up from a chair using your arms (e.g., wheelchair or bedside chair)?: A Little Help needed to walk in hospital room?: A Little Help needed climbing 3-5 steps with a railing? : A Little 6 Click Score: 17    End of Session Equipment Utilized During Treatment: Gait belt Activity Tolerance: Patient tolerated treatment well Patient left: with call bell/phone within reach;in chair Nurse Communication: Mobility status PT Visit Diagnosis: Other abnormalities of gait and mobility (R26.89);Muscle weakness (generalized) (M62.81);Pain Pain - Right/Left: Right Pain - part of body: Hip     Time: 9057-8991 PT Time Calculation (min) (ACUTE ONLY): 26 min  Charges:    $Gait Training: 8-22 mins $Therapeutic Activity: 8-22 mins PT General Charges $$ ACUTE PT VISIT: 1 Visit                     [name], PT, DPT Acute Rehabilitation Services Office [phone]    Stephanie Parks 08/29/2023, 11:16 AM

## 2023-09-01 ENCOUNTER — Encounter: Admitting: Orthopaedic Surgery

## 2023-09-01 DIAGNOSIS — M1611 Unilateral primary osteoarthritis, right hip: Secondary | ICD-10-CM | POA: Diagnosis not present

## 2023-09-01 DIAGNOSIS — D509 Iron deficiency anemia, unspecified: Secondary | ICD-10-CM | POA: Diagnosis not present

## 2023-09-01 DIAGNOSIS — Z96641 Presence of right artificial hip joint: Secondary | ICD-10-CM | POA: Diagnosis not present

## 2023-09-01 DIAGNOSIS — Z86711 Personal history of pulmonary embolism: Secondary | ICD-10-CM | POA: Diagnosis not present

## 2023-09-01 DIAGNOSIS — I10 Essential (primary) hypertension: Secondary | ICD-10-CM | POA: Diagnosis not present

## 2023-09-02 DIAGNOSIS — Z96641 Presence of right artificial hip joint: Secondary | ICD-10-CM | POA: Diagnosis not present

## 2023-09-02 DIAGNOSIS — M6281 Muscle weakness (generalized): Secondary | ICD-10-CM | POA: Diagnosis not present

## 2023-09-02 DIAGNOSIS — R2689 Other abnormalities of gait and mobility: Secondary | ICD-10-CM | POA: Diagnosis not present

## 2023-09-02 DIAGNOSIS — Z471 Aftercare following joint replacement surgery: Secondary | ICD-10-CM | POA: Diagnosis not present

## 2023-09-03 DIAGNOSIS — I2510 Atherosclerotic heart disease of native coronary artery without angina pectoris: Secondary | ICD-10-CM | POA: Diagnosis not present

## 2023-09-03 DIAGNOSIS — M1611 Unilateral primary osteoarthritis, right hip: Secondary | ICD-10-CM | POA: Diagnosis not present

## 2023-09-03 DIAGNOSIS — Z794 Long term (current) use of insulin: Secondary | ICD-10-CM | POA: Diagnosis not present

## 2023-09-03 DIAGNOSIS — Z8673 Personal history of transient ischemic attack (TIA), and cerebral infarction without residual deficits: Secondary | ICD-10-CM | POA: Diagnosis not present

## 2023-09-03 DIAGNOSIS — I480 Paroxysmal atrial fibrillation: Secondary | ICD-10-CM | POA: Diagnosis not present

## 2023-09-03 DIAGNOSIS — E1140 Type 2 diabetes mellitus with diabetic neuropathy, unspecified: Secondary | ICD-10-CM | POA: Diagnosis not present

## 2023-09-03 DIAGNOSIS — Z96641 Presence of right artificial hip joint: Secondary | ICD-10-CM | POA: Diagnosis not present

## 2023-09-03 DIAGNOSIS — D509 Iron deficiency anemia, unspecified: Secondary | ICD-10-CM | POA: Diagnosis not present

## 2023-09-03 DIAGNOSIS — Z86711 Personal history of pulmonary embolism: Secondary | ICD-10-CM | POA: Diagnosis not present

## 2023-09-03 DIAGNOSIS — G4733 Obstructive sleep apnea (adult) (pediatric): Secondary | ICD-10-CM | POA: Diagnosis not present

## 2023-09-03 DIAGNOSIS — K219 Gastro-esophageal reflux disease without esophagitis: Secondary | ICD-10-CM | POA: Diagnosis not present

## 2023-09-03 DIAGNOSIS — I119 Hypertensive heart disease without heart failure: Secondary | ICD-10-CM | POA: Diagnosis not present

## 2023-09-04 DIAGNOSIS — I10 Essential (primary) hypertension: Secondary | ICD-10-CM | POA: Diagnosis not present

## 2023-09-04 DIAGNOSIS — Z96641 Presence of right artificial hip joint: Secondary | ICD-10-CM | POA: Diagnosis not present

## 2023-09-04 DIAGNOSIS — D509 Iron deficiency anemia, unspecified: Secondary | ICD-10-CM | POA: Diagnosis not present

## 2023-09-04 DIAGNOSIS — Z7901 Long term (current) use of anticoagulants: Secondary | ICD-10-CM | POA: Diagnosis not present

## 2023-09-04 DIAGNOSIS — M1611 Unilateral primary osteoarthritis, right hip: Secondary | ICD-10-CM | POA: Diagnosis not present

## 2023-09-08 ENCOUNTER — Encounter: Payer: Self-pay | Admitting: Orthopaedic Surgery

## 2023-09-08 ENCOUNTER — Ambulatory Visit (INDEPENDENT_AMBULATORY_CARE_PROVIDER_SITE_OTHER): Admitting: Orthopaedic Surgery

## 2023-09-08 DIAGNOSIS — Z96641 Presence of right artificial hip joint: Secondary | ICD-10-CM

## 2023-09-08 MED ORDER — OXYCODONE HCL 5 MG PO TABS: 5.0000 mg | ORAL_TABLET | Freq: Four times a day (QID) | ORAL | 0 refills | Status: AC | PRN

## 2023-09-08 MED ORDER — METHOCARBAMOL 500 MG PO TABS: 500.0000 mg | ORAL_TABLET | Freq: Four times a day (QID) | ORAL | 0 refills | Status: AC | PRN

## 2023-09-08 NOTE — Progress Notes (Signed)
 The patient is here today for first postoperative visit status post a right total hip arthroplasty.  She is 78 years old.  She did spend a few days in skilled nursing after surgery but nothing has been set up since she has been home in terms of any home health PT or outpatient PT.  She was sent home without any type of pain medication or muscle relaxant as well.  She does wish to have that called into her pharmacy today.  Family is with her today as well.  Her right hip incision looks good.  Staples are removed and Steri-Strips applied.  The hip is moving well.  She will continue to mobilize as much she can.  Will see if we can set up home health therapy for her to work on her balance and coordination versus outpatient PT.  I will send in some oxycodone  and Robaxin  for her and then we will see her back in a month to see how she is doing overall.

## 2023-09-11 ENCOUNTER — Other Ambulatory Visit: Payer: Self-pay | Admitting: Pharmacist

## 2023-09-11 NOTE — Progress Notes (Signed)
   09/11/2023 Name: Stephanie Parks MRN: [id number] DOB: 04-Aug-1945  Chief Complaint  Patient presents with   Medication Management    Stephanie Parks is a 78 y.o. year old female who presented for a telephone visit.   They were referred to the pharmacist by their PCP for assistance in managing medication access.    Subjective: Patient was referred to Clinical Pharmacist Practitioner to assist with initiation of Wegovy .  Patient is noted to have history of TIA 05/2022 and also CAD.  She also had sleep apnea with use of CPAP. Her baseline sleep study from 12/17/18 showed mild obstructive sleep  apnea, moderate during supine sleep, with a total AHI of 10.1/hour, supine AHI of 21/hour, REM AHI of 8.6/hour, and O2 nadir of 78%. The patient endorsed the Epworth Sleepiness Scale at 7 points.   Started [employer] in June 2025. She took for about 4 weeks but has stopped. She reports that she was concerned that it would affect her visions she also had to stop Wegovy  prior to her recent hip replacement surgery.   Prior authorization for Wegovy  was approved thru 02/18/2024. Cost of Wegovy  will be $47 per month.   Starting Weight = 228  Total weight loss with Wegovy  was only 2lbs - was only on Wegovy  fro about 1 month.  BMI of 39 Height = 5' 4  Wt Readings from Last 3 Encounters:  08/26/23 226 lb 14.4 oz (102.9 kg)  08/20/23 226 lb 14.4 oz (102.9 kg)  08/19/23 226 lb 14.4 oz (102.9 kg)    BP Readings from Last 3 Encounters:  08/29/23 (!) 107/56  08/19/23 130/65  08/12/23 134/66   Exercise - none in the last several weeks due to hip replacment   Objective:  Lab Results  Component Value Date   HGBA1C 6.1 (H) 06/08/2022    Lab Results  Component Value Date   CREATININE 0.90 08/27/2023   BUN 7 (L) 08/27/2023   NA 134 (L) 08/27/2023   K 3.9 08/27/2023   CL 95 (L) 08/27/2023   CO2 24 08/27/2023    Lab Results  Component Value Date   CHOL 180 06/08/2022   HDL 67 06/08/2022    LDLCALC 101 (H) 06/08/2022   TRIG 62 06/08/2022   CHOLHDL 2.7 06/08/2022      Assessment/Plan:  History of TIA / CAD - secondary prevention of ASCVD + obesity class 2:  - Removed Wegovy  from her medication list - patient preference.  - Discussed alternative of Zepbound since patient also has diagnosis of sleep apnea - patient will consider and call office if she would like to try Zepbound.     Follow up is needed in the future / patient decides to try Zepbound.     Madelin Ray, PharmD Clinical Pharmacist [facility]

## 2023-09-12 DIAGNOSIS — I671 Cerebral aneurysm, nonruptured: Secondary | ICD-10-CM | POA: Diagnosis not present

## 2023-09-12 DIAGNOSIS — K449 Diaphragmatic hernia without obstruction or gangrene: Secondary | ICD-10-CM | POA: Diagnosis not present

## 2023-09-12 DIAGNOSIS — Z8673 Personal history of transient ischemic attack (TIA), and cerebral infarction without residual deficits: Secondary | ICD-10-CM | POA: Diagnosis not present

## 2023-09-12 DIAGNOSIS — Z96643 Presence of artificial hip joint, bilateral: Secondary | ICD-10-CM | POA: Diagnosis not present

## 2023-09-12 DIAGNOSIS — E7800 Pure hypercholesterolemia, unspecified: Secondary | ICD-10-CM | POA: Diagnosis not present

## 2023-09-12 DIAGNOSIS — I10 Essential (primary) hypertension: Secondary | ICD-10-CM | POA: Diagnosis not present

## 2023-09-12 DIAGNOSIS — I2694 Multiple subsegmental pulmonary emboli without acute cor pulmonale: Secondary | ICD-10-CM | POA: Diagnosis not present

## 2023-09-12 DIAGNOSIS — Z471 Aftercare following joint replacement surgery: Secondary | ICD-10-CM | POA: Diagnosis not present

## 2023-09-12 DIAGNOSIS — K5900 Constipation, unspecified: Secondary | ICD-10-CM | POA: Diagnosis not present

## 2023-09-12 DIAGNOSIS — J302 Other seasonal allergic rhinitis: Secondary | ICD-10-CM | POA: Diagnosis not present

## 2023-09-12 DIAGNOSIS — K219 Gastro-esophageal reflux disease without esophagitis: Secondary | ICD-10-CM | POA: Diagnosis not present

## 2023-09-12 DIAGNOSIS — R7303 Prediabetes: Secondary | ICD-10-CM | POA: Diagnosis not present

## 2023-09-12 DIAGNOSIS — B351 Tinea unguium: Secondary | ICD-10-CM | POA: Diagnosis not present

## 2023-09-12 DIAGNOSIS — G4733 Obstructive sleep apnea (adult) (pediatric): Secondary | ICD-10-CM | POA: Diagnosis not present

## 2023-09-12 DIAGNOSIS — G43909 Migraine, unspecified, not intractable, without status migrainosus: Secondary | ICD-10-CM | POA: Diagnosis not present

## 2023-09-12 DIAGNOSIS — I2510 Atherosclerotic heart disease of native coronary artery without angina pectoris: Secondary | ICD-10-CM | POA: Diagnosis not present

## 2023-09-12 DIAGNOSIS — K5790 Diverticulosis of intestine, part unspecified, without perforation or abscess without bleeding: Secondary | ICD-10-CM | POA: Diagnosis not present

## 2023-09-12 DIAGNOSIS — Z7901 Long term (current) use of anticoagulants: Secondary | ICD-10-CM | POA: Diagnosis not present

## 2023-09-12 DIAGNOSIS — M170 Bilateral primary osteoarthritis of knee: Secondary | ICD-10-CM | POA: Diagnosis not present

## 2023-09-12 DIAGNOSIS — D509 Iron deficiency anemia, unspecified: Secondary | ICD-10-CM | POA: Diagnosis not present

## 2023-09-12 DIAGNOSIS — Z79899 Other long term (current) drug therapy: Secondary | ICD-10-CM | POA: Diagnosis not present

## 2023-09-17 DIAGNOSIS — K5900 Constipation, unspecified: Secondary | ICD-10-CM | POA: Diagnosis not present

## 2023-09-17 DIAGNOSIS — K5790 Diverticulosis of intestine, part unspecified, without perforation or abscess without bleeding: Secondary | ICD-10-CM | POA: Diagnosis not present

## 2023-09-17 DIAGNOSIS — M170 Bilateral primary osteoarthritis of knee: Secondary | ICD-10-CM | POA: Diagnosis not present

## 2023-09-17 DIAGNOSIS — Z471 Aftercare following joint replacement surgery: Secondary | ICD-10-CM | POA: Diagnosis not present

## 2023-09-17 DIAGNOSIS — Z96643 Presence of artificial hip joint, bilateral: Secondary | ICD-10-CM | POA: Diagnosis not present

## 2023-09-17 DIAGNOSIS — G4733 Obstructive sleep apnea (adult) (pediatric): Secondary | ICD-10-CM | POA: Diagnosis not present

## 2023-09-17 DIAGNOSIS — D509 Iron deficiency anemia, unspecified: Secondary | ICD-10-CM | POA: Diagnosis not present

## 2023-09-17 DIAGNOSIS — I10 Essential (primary) hypertension: Secondary | ICD-10-CM | POA: Diagnosis not present

## 2023-09-17 DIAGNOSIS — G43909 Migraine, unspecified, not intractable, without status migrainosus: Secondary | ICD-10-CM | POA: Diagnosis not present

## 2023-09-17 DIAGNOSIS — I2694 Multiple subsegmental pulmonary emboli without acute cor pulmonale: Secondary | ICD-10-CM | POA: Diagnosis not present

## 2023-09-17 DIAGNOSIS — E7800 Pure hypercholesterolemia, unspecified: Secondary | ICD-10-CM | POA: Diagnosis not present

## 2023-09-17 DIAGNOSIS — I671 Cerebral aneurysm, nonruptured: Secondary | ICD-10-CM | POA: Diagnosis not present

## 2023-09-17 DIAGNOSIS — R7303 Prediabetes: Secondary | ICD-10-CM | POA: Diagnosis not present

## 2023-09-17 DIAGNOSIS — K219 Gastro-esophageal reflux disease without esophagitis: Secondary | ICD-10-CM | POA: Diagnosis not present

## 2023-09-17 DIAGNOSIS — K449 Diaphragmatic hernia without obstruction or gangrene: Secondary | ICD-10-CM | POA: Diagnosis not present

## 2023-09-17 DIAGNOSIS — Z7901 Long term (current) use of anticoagulants: Secondary | ICD-10-CM | POA: Diagnosis not present

## 2023-09-17 DIAGNOSIS — Z8673 Personal history of transient ischemic attack (TIA), and cerebral infarction without residual deficits: Secondary | ICD-10-CM | POA: Diagnosis not present

## 2023-09-17 DIAGNOSIS — J302 Other seasonal allergic rhinitis: Secondary | ICD-10-CM | POA: Diagnosis not present

## 2023-09-17 DIAGNOSIS — I2510 Atherosclerotic heart disease of native coronary artery without angina pectoris: Secondary | ICD-10-CM | POA: Diagnosis not present

## 2023-09-17 DIAGNOSIS — Z79899 Other long term (current) drug therapy: Secondary | ICD-10-CM | POA: Diagnosis not present

## 2023-09-17 DIAGNOSIS — B351 Tinea unguium: Secondary | ICD-10-CM | POA: Diagnosis not present

## 2023-09-19 ENCOUNTER — Ambulatory Visit (INDEPENDENT_AMBULATORY_CARE_PROVIDER_SITE_OTHER): Payer: Self-pay

## 2023-09-19 DIAGNOSIS — J309 Allergic rhinitis, unspecified: Secondary | ICD-10-CM | POA: Diagnosis not present

## 2023-09-22 ENCOUNTER — Telehealth: Payer: Self-pay | Admitting: Pharmacist

## 2023-09-22 ENCOUNTER — Ambulatory Visit: Payer: Self-pay

## 2023-09-22 DIAGNOSIS — I2510 Atherosclerotic heart disease of native coronary artery without angina pectoris: Secondary | ICD-10-CM | POA: Diagnosis not present

## 2023-09-22 DIAGNOSIS — B351 Tinea unguium: Secondary | ICD-10-CM | POA: Diagnosis not present

## 2023-09-22 DIAGNOSIS — R7303 Prediabetes: Secondary | ICD-10-CM | POA: Diagnosis not present

## 2023-09-22 DIAGNOSIS — K5790 Diverticulosis of intestine, part unspecified, without perforation or abscess without bleeding: Secondary | ICD-10-CM | POA: Diagnosis not present

## 2023-09-22 DIAGNOSIS — Z79899 Other long term (current) drug therapy: Secondary | ICD-10-CM | POA: Diagnosis not present

## 2023-09-22 DIAGNOSIS — Z7901 Long term (current) use of anticoagulants: Secondary | ICD-10-CM | POA: Diagnosis not present

## 2023-09-22 DIAGNOSIS — Z8673 Personal history of transient ischemic attack (TIA), and cerebral infarction without residual deficits: Secondary | ICD-10-CM | POA: Diagnosis not present

## 2023-09-22 DIAGNOSIS — J302 Other seasonal allergic rhinitis: Secondary | ICD-10-CM | POA: Diagnosis not present

## 2023-09-22 DIAGNOSIS — I671 Cerebral aneurysm, nonruptured: Secondary | ICD-10-CM | POA: Diagnosis not present

## 2023-09-22 DIAGNOSIS — G4733 Obstructive sleep apnea (adult) (pediatric): Secondary | ICD-10-CM | POA: Diagnosis not present

## 2023-09-22 DIAGNOSIS — K219 Gastro-esophageal reflux disease without esophagitis: Secondary | ICD-10-CM | POA: Diagnosis not present

## 2023-09-22 DIAGNOSIS — K5900 Constipation, unspecified: Secondary | ICD-10-CM | POA: Diagnosis not present

## 2023-09-22 DIAGNOSIS — D509 Iron deficiency anemia, unspecified: Secondary | ICD-10-CM | POA: Diagnosis not present

## 2023-09-22 DIAGNOSIS — Z96643 Presence of artificial hip joint, bilateral: Secondary | ICD-10-CM | POA: Diagnosis not present

## 2023-09-22 DIAGNOSIS — I10 Essential (primary) hypertension: Secondary | ICD-10-CM | POA: Diagnosis not present

## 2023-09-22 DIAGNOSIS — M170 Bilateral primary osteoarthritis of knee: Secondary | ICD-10-CM | POA: Diagnosis not present

## 2023-09-22 DIAGNOSIS — K449 Diaphragmatic hernia without obstruction or gangrene: Secondary | ICD-10-CM | POA: Diagnosis not present

## 2023-09-22 DIAGNOSIS — I2694 Multiple subsegmental pulmonary emboli without acute cor pulmonale: Secondary | ICD-10-CM | POA: Diagnosis not present

## 2023-09-22 DIAGNOSIS — G43909 Migraine, unspecified, not intractable, without status migrainosus: Secondary | ICD-10-CM | POA: Diagnosis not present

## 2023-09-22 NOTE — Telephone Encounter (Signed)
 FYI Only or Action Required?: FYI only for provider.  Patient was last seen in primary care on 08/12/2023 by Dorina Loving, PA-C.  Called Nurse Triage reporting Hypotension.  Symptoms began several weeks ago.  Interventions attempted: Nothing.  Symptoms are: stable.  Triage Disposition: See Within 2 Weeks in [employer] understands and will follow disposition?: Yes, will follow disposition  Copied from CRM [id number]. Topic: Clinical - Red Word Triage >> Sep 22, 2023  1:25 PM Deleta RAMAN wrote: Red Word that prompted transfer to Nurse Triage: Patient blood pressure has been low for the past few weeks. No other symptoms Answer Assessment - Initial Assessment Questions 1. BLOOD PRESSURE: What is your blood pressure? Did you take at least two measurements 5 minutes apart?     Pt states that her systolic BP has been 111-122, states normally runs in the 130s 2. ONSET: When did you take your blood pressure?     This morning 3. HOW: How did you take your blood pressure? (e.g., visiting nurse, automatic home BP monitor)     auto 4. HISTORY: Do you have a history of low blood pressure? What is your blood pressure normally?     denies 5. MEDICINES: Are you taking any medicines for blood pressure? If Yes, ask: Have they been changed recently?     denies 6. PULSE RATE: Do you know what your pulse rate is?      96 HR 7. OTHER SYMPTOMS: Have you been sick recently? Have you had a recent injury?     States that she was lightheaded about a week ago, states she is feeling fine at this time  Protocols used: Blood Pressure - Low-A-AH

## 2023-09-22 NOTE — Progress Notes (Unsigned)
 09/22/2023 Name: Stephanie Parks MRN: [id number] DOB: 1946/01/04  Chief Complaint  Patient presents with   Obstructive Sleep Apnea   Obesity    Stephanie Parks is a 78 y.o. year old female. She called into Clinical Pharmacist Practitioner to discuss alternative to Wegovy .    They were referred to the pharmacist by their PCP for assistance in managing medication access.    Subjective: Patient was referred to Clinical Pharmacist Practitioner to assist with initiation of Wegovy . She tried Wegovy  with some success but stopped when she started to have problems with her right eye. She was concerned that it was related to Wegovy .   Patient is noted to have history of TIA 05/2022 and also CAD.  She also has been diagnoses with mild to moderate sleep apnea. She uses CPAP nightly. She sees neurology for OSA. Her net appointment is scheduled for next week 10/01/2023.   Last sleep study was from 12/17/18. apnea, moderate during supine sleep, with a total AHI of 10.1/hour, supine AHI of 21/hour, REM AHI of 8.6/hour, and O2 nadir of 78%. The patient endorsed the Epworth Sleepiness Scale at 7 points.    IMPRESSION:  1. Obstructive Sleep Apnea (OSA) 2. Dysfunctions associated with sleep stages or arousal from sleep  RECOMMENDATIONS:  1. This study demonstrates overall mild obstructive sleep apnea, moderate during supine sleep with a total AHI of 10.1/hour, supine AHI of 21/hour, REM AHI of 8.6/hour, and O2 nadir of 78%. Given the patient's medical history and sleep related complaints, treatment with positive airway pressure is recommended; a full-night CPAP titration study is advised to optimize therapy. Other treatment options may include avoidance of supine sleep position along with weight loss, upper airway or jaw surgery in selected patients or the use of an oral appliance in certain patients. ENT evaluation and/or consultation with a maxillofacial surgeon or dentist may be feasible  in some instances.   2. This study shows sleep fragmentation and abnormal sleep stage percentages; these are nonspecific findings and per se do not signify an intrinsic sleep disorder or a cause for the patient's sleep-related symptoms. Causes include (but are not limited to) the first night effect of the sleep study, circadian rhythm disturbances, medication effect or an underlying mood disorder or medical problem. 3. The patient should be cautioned not to drive, work at heights, or operate dangerous or heavy equipment when tired or sleepy. Review and reiteration of good sleep hygiene measures should be pursued with any patient. 4. The patient will be seen in follow-up in the sleep clinic at [facility] for discussion of the test results, symptom and treatment compliance review, further management strategies, etc. The referring provider will be notified of the test results.  I certify that I have reviewed the entire raw data recording prior to the issuance of this report in accordance with the Standards of Accreditation of the American Academy of Sleep Medicine (AASM)   True Mar, MD, PhD Diplomat, American Board of Neurology and Sleep Medicine (Neurology and Sleep Medicine)    Result Care Coordination  Result Notes   Josette Lulas, RN 12/30/2018  8:58 AM EST Back to Top    See telephone note from 12/30/18.   True Mar, MD 12/30/2018  8:04 AM EST     Patient referred by Greig CROME, NP, seen by me on 11/24/18, diagnostic PSG on 12/17/18.   Please call and notify the patient that the recent sleep study showed mild to moderate obstructive sleep apnea. I recommend treatment for  this in the form of CPAP. This will require a repeat sleep study for proper titration and mask fitting and correct monitoring of the oxygen  saturations. Please explain to patient. I have placed an order in the chart. Thanks.  True Mar, MD, PhD [facility] ([facility])     Starting Weight = 228   Total weight loss with Wegovy  was only 2lbs - was only on Wegovy  for about 1 month.  BMI of 39 Height = 5' 4  Wt Readings from Last 3 Encounters:  08/26/23 226 lb 14.4 oz (102.9 kg)  08/20/23 226 lb 14.4 oz (102.9 kg)  08/19/23 226 lb 14.4 oz (102.9 kg)    BP Readings from Last 3 Encounters:  08/29/23 (!) 107/56  08/19/23 130/65  08/12/23 134/66   Exercise - none in the last several weeks due to hip replacment   Objective:  Lab Results  Component Value Date   HGBA1C 6.1 (H) 06/08/2022    Lab Results  Component Value Date   CREATININE 0.90 08/27/2023   BUN 7 (L) 08/27/2023   NA 134 (L) 08/27/2023   K 3.9 08/27/2023   CL 95 (L) 08/27/2023   CO2 24 08/27/2023    Lab Results  Component Value Date   CHOL 180 06/08/2022   HDL 67 06/08/2022   LDLCALC 101 (H) 06/08/2022   TRIG 62 06/08/2022   CHOLHDL 2.7 06/08/2022      Assessment/Plan:  Obstructive Sleep Apnea with History of TIA / CAD - secondary prevention of ASCVD + obesity class 2:  - Start Zepbound  if OK with PCP. Suspect will need prior authorization. Startig dose 2.5mg  weekly for 4 weeks, then 5mg  weekly for 4 weeks, then 7.5mg  weekly.      Follow up in 3 to 4 week.  Madelin Ray, PharmD Clinical Pharmacist [facility]

## 2023-09-23 MED ORDER — ZEPBOUND 2.5 MG/0.5ML ~~LOC~~ SOAJ: 2.5000 mg | SUBCUTANEOUS | 0 refills | Status: DC

## 2023-09-23 NOTE — Telephone Encounter (Signed)
 09/23/2023 - Addendum Rx sent to pharmacy fro Zepbound  2.5mg  weekly  Meds ordered this encounter  Medications   tirzepatide  (ZEPBOUND ) 2.5 MG/0.5ML Pen    Sig: Inject 2.5 mg into the skin once a week.    Dispense:  2 mL    Refill:  0

## 2023-09-24 ENCOUNTER — Other Ambulatory Visit (HOSPITAL_BASED_OUTPATIENT_CLINIC_OR_DEPARTMENT_OTHER): Payer: Self-pay | Admitting: Family Medicine

## 2023-09-24 ENCOUNTER — Other Ambulatory Visit: Payer: Self-pay | Admitting: Family Medicine

## 2023-09-24 DIAGNOSIS — R600 Localized edema: Secondary | ICD-10-CM

## 2023-09-24 DIAGNOSIS — Z1231 Encounter for screening mammogram for malignant neoplasm of breast: Secondary | ICD-10-CM

## 2023-09-25 ENCOUNTER — Ambulatory Visit (HOSPITAL_BASED_OUTPATIENT_CLINIC_OR_DEPARTMENT_OTHER)
Admission: RE | Admit: 2023-09-25 | Discharge: 2023-09-25 | Disposition: A | Discharge status: Home / Self Care | Source: Ambulatory Visit | Attending: Family Medicine | Admitting: Family Medicine

## 2023-09-25 ENCOUNTER — Encounter (HOSPITAL_BASED_OUTPATIENT_CLINIC_OR_DEPARTMENT_OTHER): Payer: Self-pay

## 2023-09-25 ENCOUNTER — Ambulatory Visit (INDEPENDENT_AMBULATORY_CARE_PROVIDER_SITE_OTHER)

## 2023-09-25 DIAGNOSIS — J309 Allergic rhinitis, unspecified: Secondary | ICD-10-CM

## 2023-09-25 DIAGNOSIS — Z1231 Encounter for screening mammogram for malignant neoplasm of breast: Secondary | ICD-10-CM | POA: Insufficient documentation

## 2023-09-25 NOTE — Progress Notes (Signed)
 VIALS MADE 09-25-23

## 2023-09-28 NOTE — Progress Notes (Signed)
 [facility] at [facility] [address] [place], [place] [address] [phone]  Date:  10/01/2023   Name:  Stephanie Parks   DOB:  1945-10-25   MRN:  [id number]  PCP:  Watt Harlene BROCKS, MD    Chief Complaint: Follow-up   History of Present Illness:  Stephanie Parks is a 78 y.o. very pleasant female patient who presents with the following:  Patient seen today for follow-up, concerns about blood pressure I last saw Stephanie Parks in May History of hypertension, hyperlipidemia, allergic rhinitis, OSA on CPAP. She has tended to have frequent ER visits for chest pain likely related to anxiety. She also had COVID-19 complicated by pulmonary embolism January of 2022 -She is no longer using oxygen . Due to recurrent PE in May of 2024 she is now on lifelong eliqus   Lumbar decompression 1998   She had a right total hip in July per Dr. Medford Poli.  She spent a few days at skilled rehab and then went home. She is doing PT still. Overall she feels like her pain is improved and her hip is much better than prior to her surgery  She has been using Zepbound  which was approved due to her history of sleep apnea- however pt states she did not receive this and did not start prescription as of yet.  I resent the prescription for her today  Recommend flu shot this fall Recommend tetanus booster, shingles vaccine She had a colonoscopy in 2020 per Dr. Joaquim she is due for follow-up-I will circle back with Dr. Stoney about this  She notes her heart may race with rate between 80- 90 BPM She is on carvedilol  She may note tachycardia any time-typically at rest, she does not notice it with exercise usually She is not having any chest pain or pressure, no SOB This is similar to what was bothering her last year when she saw cardiology and did a zio patch per Dr. Larose Normal sinus rhythm and sinus tachycardia with rare PACs and PVCs.    Symptoms  correlated to sinus tachycardia.    No symptoms with ectopic beats.    No sustained arrhythmias.  Rare, brief episodes of Mobitz I AV block.    Relatively high average HR, 94 bpm.   She is concerned that her BP was low once when checked by her physical therapist; she BP was 111/?  Otherwise she has not been aware of any low blood pressure readings  Patient Active Problem List   Diagnosis Date Noted   Status post total replacement of right hip 08/26/2023   Unilateral primary osteoarthritis, right knee 07/02/2023   Multiple subsegmental pulmonary emboli without acute cor pulmonale (HCC) 07/23/2022   TIA (transient ischemic attack) 06/08/2022   Diastolic dysfunction 06/08/2022   Obesity (BMI 30-39.9) 06/08/2022   OSA (obstructive sleep apnea) 06/08/2022   Status post total replacement of left hip 09/04/2021   DJD (degenerative joint disease) 09/04/2021   Iron deficiency anemia 03/13/2021   Erythropoietin  deficiency anemia 03/13/2021   Greater trochanteric bursitis of left hip 10/18/2020   Headache 06/07/2020   Acute respiratory failure with hypoxia (HCC) 05/02/2020   Post-COVID chronic dyspnea 05/02/2020   Pulsatile neck mass 04/26/2020   Pneumonia due to COVID-19 virus 03/12/2020   Headache disorder 01/04/2020   Seasonal allergic conjunctivitis 10/04/2019   Dysfunction of right eustachian tube 10/04/2019   Pre-diabetes 06/02/2019   Seasonal and perennial allergic rhinitis 12/22/2018  Heart palpitations 12/22/2018   History of chest pain 07/20/2018   Brain aneurysm 07/20/2018   Dyslipidemia 05/04/2018   Tendonitis, Achilles, right 12/08/2017   Unilateral primary osteoarthritis, left knee 10/07/2017   Ingrown toenail 08/10/2017   Coughing 04/24/2017   CAD (coronary artery disease) 03/01/2015   Aneurysm (HCC) 03/01/2015   Dizziness 03/01/2015   Hypokalemia 03/01/2015   Paresthesia of arm 03/01/2015   Achalasia 09/28/2012   Constipation 09/28/2012   Cephalalgia 08/24/2012    Essential hypertension 04/19/2011   Hyperlipidemia 04/19/2011   GERD (gastroesophageal reflux disease) 04/19/2011   Chest pain 04/04/2011    Past Medical History:  Diagnosis Date   Achalasia 09/28/2012   Allergic rhinitis 03/01/2015   Anemia    Aneurysm (HCC) 03/01/2015   stable 5 mm periopthalmic right ICA aneurysm 03/25/21 CTA   BP (high blood pressure) 03/01/2015   Bronchitis 04/23/2018   Cephalalgia 08/24/2012   Overview:  ICD-10 cut over     Chest pain 04/04/2011   Coronary Ca Score 0, no significant CAD 08/20/18; CP with troponin 4>32 s/p LHC showing normal coronaries 12/29/18   CN (constipation) 09/28/2012   Coronary artery disease    Coughing 04/24/2017   Decreased potassium in the blood 03/01/2015   Diverticulosis    Dizziness 03/01/2015   Erythropoietin  deficiency anemia 03/13/2021   GERD (gastroesophageal reflux disease)    Hiatal hernia    Hypercholesterolemia 03/01/2015   Hyperlipidemia    Hypertension    Ingrown toenail 08/10/2017   Inguinal hernia    Iron deficiency anemia 03/13/2021   Migraine headache    Onychomycosis 12/08/2017   Paresthesia of arm 03/01/2015   PE (pulmonary thromboembolism) (HCC) 03/13/2020   in setting of + COVID-19, s/p Eliquis  x 3 month (recurrent)   Schatzki's ring    Sleep apnea    uses CPAP   Tendonitis, Achilles, right 12/08/2017   TIA (transient ischemic attack)    Unilateral primary osteoarthritis, left knee 10/07/2017    Past Surgical History:  Procedure Laterality Date   ABDOMINAL HYSTERECTOMY     BACK SURGERY     x 3   CARDIAC CATHETERIZATION  12/29/2018   angiographically normal coronary arteries 12/29/18 ([place])   CARPAL TUNNEL RELEASE     left wrist   CERVICAL SPINE SURGERY     HEMORRHOID SURGERY     TOTAL HIP ARTHROPLASTY Left 09/04/2021   Procedure: LEFT TOTAL HIP ARTHROPLASTY ANTERIOR APPROACH;  Surgeon: Vernetta Lonni GRADE, MD;  Location: [facility] OR;  Service: Orthopedics;  Laterality: Left;   TOTAL  HIP ARTHROPLASTY Right 08/26/2023   Procedure: ARTHROPLASTY, HIP, TOTAL, ANTERIOR APPROACH;  Surgeon: Vernetta Lonni GRADE, MD;  Location: [facility] OR;  Service: Orthopedics;  Laterality: Right;    Social History   Tobacco Use   Smoking status: Former    Current packs/day: 0.00    Average packs/day: 0.5 packs/day for 20.0 years (10.0 ttl pk-yrs)    Types: Cigarettes    Start date: 02/19/1964    Quit date: 02/19/1984    Years since quitting: 39.6   Smokeless tobacco: Never  Vaping Use   Vaping status: Never Used  Substance Use Topics   Alcohol  use: No   Drug use: No    Family History  Problem Relation Age of Onset   Allergic rhinitis Sister    Diabetes Sister    Hypertension Sister    Heart attack Father 76   Heart disease Father    Stomach cancer Maternal Grandmother    Heart disease  Mother    Colon cancer Neg Hx    Angioedema Neg Hx    Asthma Neg Hx    Eczema Neg Hx    Urticaria Neg Hx    Immunodeficiency Neg Hx    Breast cancer Neg Hx    Migraines Neg Hx    Headache Neg Hx     Allergies  Allergen Reactions   Methylprednisolone  Other (See Comments)    Lightheaded and Hallucinations   Shellfish Allergy Hives and Swelling   Ace Inhibitors Other (See Comments)    Pt cannot recall her reaction but tolerates arb    Gabapentin  Other (See Comments)    headaches   Pitavastatin     Unknown reaction  Other Reaction(s): Other (See Comments)  ADVERSE REACTION,  Pt unaware of this reaction; states she has been on crestor  in the past with no issues   Sulfa Antibiotics Other (See Comments) and Rash    Fine bumps   Topiramate  Palpitations    Heart race,     Medication list has been reviewed and updated.  Current Outpatient Medications on File Prior to Visit  Medication Sig Dispense Refill   albuterol  (VENTOLIN  HFA) 108 (90 Base) MCG/ACT inhaler Inhale 1-2 puffs into the lungs every 6 (six) hours as needed for wheezing or shortness of breath. 8 g 0   APIXABAN  PO Take 5 mg  by mouth 2 (two) times daily.     Calcium  Carb-Cholecalciferol (CALCIUM  600 + D PO) Take 1 tablet by mouth daily.     carboxymethylcellulose (REFRESH PLUS) 0.5 % SOLN Place 1 drop into both eyes 3 (three) times daily as needed (dry eyes).     carvedilol  (COREG ) 6.25 MG tablet Take 1 tablet (6.25 mg total) by mouth 2 (two) times daily with a meal. 180 tablet 1   Cyanocobalamin  (B-12 PO) Take 2 tablets by mouth daily.     ELDERBERRY PO Take 1 capsule by mouth daily.     EPINEPHrine  (EPIPEN  2-PAK) 0.3 mg/0.3 mL IJ SOAJ injection Inject 0.3 mg into the muscle as needed for anaphylaxis. Per allergen immunotherapy protocol 1 each 0   fluticasone  (FLONASE ) 50 MCG/ACT nasal spray Place 2 sprays into both nostrils daily as needed for allergies or rhinitis. 48 g 1   losartan -hydrochlorothiazide  (HYZAAR ) 50-12.5 MG tablet Take 0.5 tablets by mouth daily. 45 tablet 3   Menthol , Topical Analgesic, (BIOFREEZE EX) Apply 1 Application topically daily as needed (pain).     methocarbamol  (ROBAXIN ) 500 MG tablet Take 1 tablet (500 mg total) by mouth every 6 (six) hours as needed for muscle spasms. 30 tablet 0   Multiple Vitamins-Minerals (ALIVE MULTI-VITAMIN PO) Take 1 tablet by mouth daily.     NON FORMULARY Pt uses a cpap nightly     nortriptyline  (PAMELOR ) 25 MG capsule Take 2 capsules (50 mg total) by mouth at bedtime. 180 capsule 1   omeprazole  (PRILOSEC) 40 MG capsule Take 1 capsule (40 mg total) by mouth daily. 90 capsule 3   oxyCODONE  (OXY IR/ROXICODONE ) 5 MG immediate release tablet Take 1-2 tablets (5-10 mg total) by mouth every 6 (six) hours as needed for moderate pain (pain score 4-6) (pain score 4-6). 30 tablet 0   polyethylene glycol (MIRALAX ) 17 g packet Take 17 g by mouth daily as needed for mild constipation or moderate constipation. (Patient not taking: Reported on 09/11/2023) 90 packet 2   potassium chloride  SA (KLOR-CON  M20) 20 MEQ tablet Take 2 tablets (40 mEq total) by mouth daily. (Patient not  taking: Reported on 09/11/2023) 180 tablet 1   rosuvastatin  (CRESTOR ) 20 MG tablet Take 1 tablet (20 mg total) by mouth daily. 90 tablet 3   spironolactone  (ALDACTONE ) 25 MG tablet TAKE 1/2 TABLET BY MOUTH EVERY DAY 45 tablet 3   tobramycin  (TOBREX ) 0.3 % ophthalmic solution Place 2 drops into the right eye every 6 (six) hours. 5 mL 0   No current facility-administered medications on file prior to visit.    Review of Systems:  As per HPI- otherwise negative.   Physical Examination: Vitals:   10/01/23 0915  BP: 120/70  Pulse: 90  Resp: 18  Temp: 98 F (36.7 C)  SpO2: 97%   Vitals:   10/01/23 0915  Weight: 223 lb (101.2 kg)  Height: 5' 5 (1.651 m)   Body mass index is 37.11 kg/m. Ideal Body Weight: Weight in (lb) to have BMI = 25: 149.9  GEN: no acute distress.  Mildly obese, looks well HEENT: Atraumatic, Normocephalic.  Ears and Nose: No external deformity. CV: RRR, No M/G/R. No JVD. No thrill. No extra heart sounds. PULM: CTA B, no wheezes, crackles, rhonchi. No retractions. No resp. distress. No accessory muscle use. ABD: S, NT, ND, +BS. No rebound. No HSM. EXTR: No c/c/e PSYCH: Normally interactive. Conversant.    Assessment and Plan:  Essential hypertension  Obstructive sleep apnea syndrome - Plan: tirzepatide  (ZEPBOUND ) 2.5 MG/0.5ML Pen  Tachycardia  Patient seen today with concern of tachycardia/palpitations and 1 low blood pressure reading.  I reassured her I do not think she needs to be overly concerned about the 1 low blood pressure reading.   If she continues to have lows please do let us  know  She is on Eliquis  for history of recurrent DVT, making a pulmonary embolism unlikely.  I also do not believe she has atrial fibrillation-she had a negative Zio patch 1 year ago.  Her cardiovascular exam today is normal.  I did ask her to please contact her cardiologist and arrange periodic follow-up to discuss symptoms of tachycardia and she agrees.  However given  her recent Zio patch workup I do not think anything else is necessary right now  I have represcribed her Zepbound , asked her to let me know if she cannot get this filled  Signed Harlene Schroeder, MD

## 2023-09-29 DIAGNOSIS — J3089 Other allergic rhinitis: Secondary | ICD-10-CM | POA: Diagnosis not present

## 2023-09-30 DIAGNOSIS — Z79899 Other long term (current) drug therapy: Secondary | ICD-10-CM | POA: Diagnosis not present

## 2023-09-30 DIAGNOSIS — Z8673 Personal history of transient ischemic attack (TIA), and cerebral infarction without residual deficits: Secondary | ICD-10-CM | POA: Diagnosis not present

## 2023-09-30 DIAGNOSIS — I2694 Multiple subsegmental pulmonary emboli without acute cor pulmonale: Secondary | ICD-10-CM | POA: Diagnosis not present

## 2023-09-30 DIAGNOSIS — I10 Essential (primary) hypertension: Secondary | ICD-10-CM | POA: Diagnosis not present

## 2023-09-30 DIAGNOSIS — B351 Tinea unguium: Secondary | ICD-10-CM | POA: Diagnosis not present

## 2023-09-30 DIAGNOSIS — R7303 Prediabetes: Secondary | ICD-10-CM | POA: Diagnosis not present

## 2023-09-30 DIAGNOSIS — I671 Cerebral aneurysm, nonruptured: Secondary | ICD-10-CM | POA: Diagnosis not present

## 2023-09-30 DIAGNOSIS — G43909 Migraine, unspecified, not intractable, without status migrainosus: Secondary | ICD-10-CM | POA: Diagnosis not present

## 2023-09-30 DIAGNOSIS — I2510 Atherosclerotic heart disease of native coronary artery without angina pectoris: Secondary | ICD-10-CM | POA: Diagnosis not present

## 2023-09-30 DIAGNOSIS — J302 Other seasonal allergic rhinitis: Secondary | ICD-10-CM | POA: Diagnosis not present

## 2023-09-30 DIAGNOSIS — Z96643 Presence of artificial hip joint, bilateral: Secondary | ICD-10-CM | POA: Diagnosis not present

## 2023-09-30 DIAGNOSIS — Z7901 Long term (current) use of anticoagulants: Secondary | ICD-10-CM | POA: Diagnosis not present

## 2023-09-30 DIAGNOSIS — K5790 Diverticulosis of intestine, part unspecified, without perforation or abscess without bleeding: Secondary | ICD-10-CM | POA: Diagnosis not present

## 2023-09-30 DIAGNOSIS — E7800 Pure hypercholesterolemia, unspecified: Secondary | ICD-10-CM | POA: Diagnosis not present

## 2023-09-30 DIAGNOSIS — K5900 Constipation, unspecified: Secondary | ICD-10-CM | POA: Diagnosis not present

## 2023-09-30 DIAGNOSIS — K449 Diaphragmatic hernia without obstruction or gangrene: Secondary | ICD-10-CM | POA: Diagnosis not present

## 2023-09-30 DIAGNOSIS — M170 Bilateral primary osteoarthritis of knee: Secondary | ICD-10-CM | POA: Diagnosis not present

## 2023-09-30 DIAGNOSIS — K219 Gastro-esophageal reflux disease without esophagitis: Secondary | ICD-10-CM | POA: Diagnosis not present

## 2023-09-30 DIAGNOSIS — Z471 Aftercare following joint replacement surgery: Secondary | ICD-10-CM | POA: Diagnosis not present

## 2023-09-30 DIAGNOSIS — D509 Iron deficiency anemia, unspecified: Secondary | ICD-10-CM | POA: Diagnosis not present

## 2023-09-30 NOTE — Progress Notes (Signed)
 SABRA

## 2023-10-01 ENCOUNTER — Encounter: Payer: Self-pay | Admitting: Family Medicine

## 2023-10-01 ENCOUNTER — Ambulatory Visit: Payer: Medicare Other | Admitting: Family Medicine

## 2023-10-01 ENCOUNTER — Ambulatory Visit: Admitting: Family Medicine

## 2023-10-01 VITALS — BP 115/69 | HR 84 | Ht 65.0 in | Wt 224.0 lb

## 2023-10-01 VITALS — BP 120/70 | HR 90 | Temp 98.0°F | Resp 18 | Ht 65.0 in | Wt 223.0 lb

## 2023-10-01 DIAGNOSIS — R000 Tachycardia, unspecified: Secondary | ICD-10-CM | POA: Diagnosis not present

## 2023-10-01 DIAGNOSIS — G4733 Obstructive sleep apnea (adult) (pediatric): Secondary | ICD-10-CM | POA: Diagnosis not present

## 2023-10-01 DIAGNOSIS — G43709 Chronic migraine without aura, not intractable, without status migrainosus: Secondary | ICD-10-CM | POA: Diagnosis not present

## 2023-10-01 DIAGNOSIS — I729 Aneurysm of unspecified site: Secondary | ICD-10-CM

## 2023-10-01 DIAGNOSIS — I10 Essential (primary) hypertension: Secondary | ICD-10-CM

## 2023-10-01 MED ORDER — ZEPBOUND 2.5 MG/0.5ML ~~LOC~~ SOAJ: 2.5000 mg | SUBCUTANEOUS | 0 refills | Status: DC

## 2023-10-01 NOTE — Patient Instructions (Addendum)
 Good to see you today I don't think we need to worry too much about that one low BP reading Please give your cardiologist a call and set up a recheck visit  Let me know if anything changes as far as your racing heart  I sent in an rx for zepbound  for you; please start on this asap

## 2023-10-01 NOTE — Progress Notes (Signed)
 PATIENT: Stephanie Parks DOB: 1945-04-14  REASON FOR VISIT: follow up HISTORY FROM: patient  Chief Complaint  Patient presents with   Follow-up    Pt in room 2. Alone. Here for cpap/migraine follow up. Pt wears nightly and naps with cpap sometimes. Feels well rested, pt said hose doesn't want to stay attached to mask. Patient reports no recent migraines.     HISTORY OF PRESENT ILLNESS:  10/01/23 ALL:  [name] returns for follow up for migraines and OSA on CPAP. She reports doing well. She reports headaches are nearly resolved. Rarely has migraine. She continues nortriptyline  50mg  daily. She is interested in reducing dose if she can. She continues to do well on CPAP. She does have occional difficulty with tubing but feels significantly better when using therapy. She has not heard from DME to replace supplies in several months. Last visit with me 03/2022.   She was seen by Dr Opal, [facility] 09/2022. Per note, I have recommended conservative management and no further surveillance imaging or follow-up given her age, as there would be no indication for treatment unless it were to bleed or cause cranial neuropathy. She continues close follow up with PCP and ophthalmology.   03/21/2022 ALL:  Stephanie Parks returns for follow up for OSA on CPAP and migraines. She was last seen 03/2021 and migraines were well controlled on nortriptyline  50mg  QHS. She reports headaches are well managed. She has had a few headaches over the past week or two. She has also felt a little lightheaded and off balance at times. She reports pulse was 115 when this happened last week. She has seen cardiology for this but declined beta blockers. She is followed by Dr Dann, cardiology. She admits that she is not drinking enough water. She was diagnosed with vertigo in 09/2021. Hgb was 9.9 09/2021, recently 11.5 in 01/2022. She has not seen Dr Ubaldo recently. BP has been normal.   She is using CPAP nightly for about 8 hours. She  denies concerns with machine but is having a difficult time tolerating head gear. She feels that it is too tight against her forehead. She has noticed a large air leak but does not feel she can tolerate mask any tighter.   She was referred to [facility] for optic neuropathy in setting of carotid aneurysm. CTA showed stable over bast 8 years. 4-73mm. She has not heard back to schedule. She is followed by Dr Gillie, NS.     03/21/2021 ALL: Stephanie Parks is a 78 y.o. female here today for follow up for OSA on CPAP.  Her compliance for the last 30 days demonstrates 100% compliance for days used and 100% compliance for >4 hours. Her AHI is 0.9 and she does have a leak of 59.6 L/min, however she doesn't have any concerns with it. She reports having issues with getting her mask detached from her headgear.   We started nortriptyline  50mg  at bedtime at last visit 10/2020. She is not taking the Emgality  or Ubrelvy  as it was not covered.  She reports that she has been doing much better with this and has not been having any headaches. She doesn't have any issues with missing medications or concerns for side effects. She does report that she has an episode of pain in her eyes last week, sudden sharp pain in one eye that instantly resolved, then it happened again two days later in the other eye. She visited her eye doctor and PCP since. No obvious triggers.  No recurrence.      HISTORY: (copied from previous note)  11/15/2020 ALL: [name] returns for worsening headaches. We started her on Ajovy  at last visit in 07/2020. She had first injection in the office. Insurance would not cover and she was switched to Emgality  with first injection around 09/16/2020. She called 09/28/2020 reporting that Emgality  was not helping and migraines were worsening. Ubrelvy  was very expensive and not effective. Steroid taper was called in at that time. She called 9/26 reporting cost of Emgality  and Ubrelvy  was too expensive and wished to  schedule follow up for change in therapy. She feels that Ajovy  and Ubrelvy  were effective.    She was seen in the ER 11/07/2020 for dizziness, headache and hypertension. She had reported a sudden onset of intractable headache following a dilated eye exam that day. BP was > 200 systolic. She was treated with hydralazine . Follow up BP 159/82. Patient reportedly taking losartan  100mg  daily and metoprolol  ER 25mg  (taking 1/2 tablet-12.5mg ). CT was negative. Blood work unremarkable. She was advised to keep BP log and follow up with PCP. She has a BP log with her today showing readings from 130's-180's systolic. She has an appt with her PCP today to discuss.    I have reviewed notes from previous neurologist. She seemed to do the best on nortriptyline . She did try this medication for about 2-3 weeks earlier this year but did not feel it was effective. She admits that anxiety could be contributing to symptoms. She has been hesitant to try sumatriptan  due to potential side effects.    Patient has tried and failed: Preventative: Ajovy  (not covered by insurance), Emgality  (ineffective and too expensive), Topiramate  (palpitations), metoprolol  (on now), gabapentin  (caused headaches), nortriptyline  (ineffective, history of constipation and dry mouth)   Abortive: Cambia, Tylenol    MM 05/19/19: Stephanie Parks is a 78 year old female with a history of obstructive sleep apnea on CPAP.  Her download indicates that she used her machine 25 out of 30 days for compliance of 83%.  She used her machine greater than 4 hours 24 days for compliance of 80%.  On average she uses her machine 6 hours and 34 minutes.  Residual AHI is 2.8 on 7 cm of water with EPR 3.  Leak in the 95th percentile is 26.1 L/min.She reports that she does see the benefit.  Reports that she is no longer having heart palpitations at night.  REVIEW OF SYSTEMS: Out of a complete 14 system review of symptoms, the patient complains only of the following symptoms, low  back pain, headaches, lightheaded, and all other reviewed systems are negative.  ESS: 11/24, previously 10/24  ALLERGIES: Allergies  Allergen Reactions   Methylprednisolone  Other (See Comments)    Lightheaded and Hallucinations   Shellfish Allergy Hives and Swelling   Ace Inhibitors Other (See Comments)    Pt cannot recall her reaction but tolerates arb    Gabapentin  Other (See Comments)    headaches   Pitavastatin     Unknown reaction  Other Reaction(s): Other (See Comments)  ADVERSE REACTION,  Pt unaware of this reaction; states she has been on crestor  in the past with no issues   Sulfa Antibiotics Other (See Comments) and Rash    Fine bumps   Topiramate  Palpitations    Heart race,     HOME MEDICATIONS: Outpatient Medications Prior to Visit  Medication Sig Dispense Refill   albuterol  (VENTOLIN  HFA) 108 (90 Base) MCG/ACT inhaler Inhale 1-2 puffs into the lungs every 6 (six)  hours as needed for wheezing or shortness of breath. 8 g 0   APIXABAN  PO Take 5 mg by mouth 2 (two) times daily.     Calcium  Carb-Cholecalciferol (CALCIUM  600 + D PO) Take 1 tablet by mouth daily.     carboxymethylcellulose (REFRESH PLUS) 0.5 % SOLN Place 1 drop into both eyes 3 (three) times daily as needed (dry eyes).     carvedilol  (COREG ) 6.25 MG tablet Take 1 tablet (6.25 mg total) by mouth 2 (two) times daily with a meal. 180 tablet 1   Cyanocobalamin  (B-12 PO) Take 2 tablets by mouth daily.     ELDERBERRY PO Take 1 capsule by mouth daily.     EPINEPHrine  (EPIPEN  2-PAK) 0.3 mg/0.3 mL IJ SOAJ injection Inject 0.3 mg into the muscle as needed for anaphylaxis. Per allergen immunotherapy protocol 1 each 0   fluticasone  (FLONASE ) 50 MCG/ACT nasal spray Place 2 sprays into both nostrils daily as needed for allergies or rhinitis. 48 g 1   losartan -hydrochlorothiazide  (HYZAAR ) 50-12.5 MG tablet Take 0.5 tablets by mouth daily. 45 tablet 3   Menthol , Topical Analgesic, (BIOFREEZE EX) Apply 1 Application  topically daily as needed (pain).     methocarbamol  (ROBAXIN ) 500 MG tablet Take 1 tablet (500 mg total) by mouth every 6 (six) hours as needed for muscle spasms. 30 tablet 0   Multiple Vitamins-Minerals (ALIVE MULTI-VITAMIN PO) Take 1 tablet by mouth daily.     NON FORMULARY Pt uses a cpap nightly     nortriptyline  (PAMELOR ) 25 MG capsule Take 2 capsules (50 mg total) by mouth at bedtime. 180 capsule 1   omeprazole  (PRILOSEC) 40 MG capsule Take 1 capsule (40 mg total) by mouth daily. 90 capsule 3   oxyCODONE  (OXY IR/ROXICODONE ) 5 MG immediate release tablet Take 1-2 tablets (5-10 mg total) by mouth every 6 (six) hours as needed for moderate pain (pain score 4-6) (pain score 4-6). 30 tablet 0   polyethylene glycol (MIRALAX ) 17 g packet Take 17 g by mouth daily as needed for mild constipation or moderate constipation. 90 packet 2   potassium chloride  SA (KLOR-CON  M20) 20 MEQ tablet Take 2 tablets (40 mEq total) by mouth daily. 180 tablet 1   rosuvastatin  (CRESTOR ) 20 MG tablet Take 1 tablet (20 mg total) by mouth daily. 90 tablet 3   spironolactone  (ALDACTONE ) 25 MG tablet TAKE 1/2 TABLET BY MOUTH EVERY DAY 45 tablet 3   tirzepatide  (ZEPBOUND ) 2.5 MG/0.5ML Pen Inject 2.5 mg into the skin once a week. 2 mL 0   tobramycin  (TOBREX ) 0.3 % ophthalmic solution Place 2 drops into the right eye every 6 (six) hours. 5 mL 0   No facility-administered medications prior to visit.    PAST MEDICAL HISTORY: Past Medical History:  Diagnosis Date   Achalasia 09/28/2012   Allergic rhinitis 03/01/2015   Anemia    Aneurysm (HCC) 03/01/2015   stable 5 mm periopthalmic right ICA aneurysm 03/25/21 CTA   BP (high blood pressure) 03/01/2015   Bronchitis 04/23/2018   Cephalalgia 08/24/2012   Overview:  ICD-10 cut over     Chest pain 04/04/2011   Coronary Ca Score 0, no significant CAD 08/20/18; CP with troponin 4>32 s/p LHC showing normal coronaries 12/29/18   CN (constipation) 09/28/2012   Coronary artery disease     Coughing 04/24/2017   Decreased potassium in the blood 03/01/2015   Diverticulosis    Dizziness 03/01/2015   Erythropoietin  deficiency anemia 03/13/2021   GERD (gastroesophageal reflux disease)  Hiatal hernia    Hypercholesterolemia 03/01/2015   Hyperlipidemia    Hypertension    Ingrown toenail 08/10/2017   Inguinal hernia    Iron deficiency anemia 03/13/2021   Migraine headache    Onychomycosis 12/08/2017   Paresthesia of arm 03/01/2015   PE (pulmonary thromboembolism) (HCC) 03/13/2020   in setting of + COVID-19, s/p Eliquis  x 3 month (recurrent)   Schatzki's ring    Sleep apnea    uses CPAP   Tendonitis, Achilles, right 12/08/2017   TIA (transient ischemic attack)    Unilateral primary osteoarthritis, left knee 10/07/2017    PAST SURGICAL HISTORY: Past Surgical History:  Procedure Laterality Date   ABDOMINAL HYSTERECTOMY     BACK SURGERY     x 3   CARDIAC CATHETERIZATION  12/29/2018   angiographically normal coronary arteries 12/29/18 ([place])   CARPAL TUNNEL RELEASE     left wrist   CERVICAL SPINE SURGERY     HEMORRHOID SURGERY     TOTAL HIP ARTHROPLASTY Left 09/04/2021   Procedure: LEFT TOTAL HIP ARTHROPLASTY ANTERIOR APPROACH;  Surgeon: Vernetta Lonni GRADE, MD;  Location: [facility] OR;  Service: Orthopedics;  Laterality: Left;   TOTAL HIP ARTHROPLASTY Right 08/26/2023   Procedure: ARTHROPLASTY, HIP, TOTAL, ANTERIOR APPROACH;  Surgeon: Vernetta Lonni GRADE, MD;  Location: [facility] OR;  Service: Orthopedics;  Laterality: Right;    FAMILY HISTORY: Family History  Problem Relation Age of Onset   Allergic rhinitis Sister    Diabetes Sister    Hypertension Sister    Heart attack Father 39   Heart disease Father    Stomach cancer Maternal Grandmother    Heart disease Mother    Colon cancer Neg Hx    Angioedema Neg Hx    Asthma Neg Hx    Eczema Neg Hx    Urticaria Neg Hx    Immunodeficiency Neg Hx    Breast cancer Neg Hx    Migraines Neg Hx    Headache Neg  Hx     SOCIAL HISTORY: Social History   Socioeconomic History   Marital status: Divorced    Spouse name: Not on file   Number of children: 2   Years of education: Not on file   Highest education level: Some college, no degree  Occupational History   Occupation: Lobbyist: [id number]  Tobacco Use   Smoking status: Former    Current packs/day: 0.00    Average packs/day: 0.5 packs/day for 20.0 years (10.0 ttl pk-yrs)    Types: Cigarettes    Start date: 02/19/1964    Quit date: 02/19/1984    Years since quitting: 39.6   Smokeless tobacco: Never  Vaping Use   Vaping status: Never Used  Substance and Sexual Activity   Alcohol  use: No   Drug use: No   Sexual activity: Yes    Partners: Male  Other Topics Concern   Not on file  Social History Narrative   Lives alone   Caffeine use: Coffee one cup daily   Right handed    Son is her next of kin   Working at [employer]   Social Drivers of [employer] Strain: Low Risk  (08/11/2023)   Overall Financial Resource Strain (CARDIA)    Difficulty of Paying Living Expenses: Not hard at all  Food Insecurity: Food Insecurity Present (08/11/2023)   Hunger Vital Sign    Worried About Running Out of Food in the Last Year: Not on file  Ran Out of Food in the Last Year: Sometimes true  Transportation Needs: No Transportation Needs (08/11/2023)   PRAPARE - Administrator, Civil Service (Medical): No    Lack of Transportation (Non-Medical): No  Physical Activity: Inactive (08/11/2023)   Exercise Vital Sign    Days of Exercise per Week: 0 days    Minutes of Exercise per Session: Not on file  Stress: No Stress Concern Present (08/11/2023)   [employer] of Occupational Health - Occupational Stress Questionnaire    Feeling of Stress: Not at all  Social Connections: Moderately Integrated (08/11/2023)   Social Connection and Isolation Panel    Frequency of Communication with Friends and Family: More than three  times a week    Frequency of Social Gatherings with Friends and Family: Once a week    Attends Religious Services: More than 4 times per year    Active Member of [employer] or Organizations: Yes    Attends Banker Meetings: 1 to 4 times per year    Marital Status: Divorced  Catering manager Violence: Not At Risk (03/25/2023)   Humiliation, Afraid, Rape, and Kick questionnaire    Fear of Current or Ex-Partner: No    Emotionally Abused: No    Physically Abused: No    Sexually Abused: No     PHYSICAL EXAM  Vitals:   10/01/23 1402  BP: 115/69  Pulse: 84  SpO2: 98%  Weight: 224 lb (101.6 kg)  Height: 5' 5 (1.651 m)    Body mass index is 37.28 kg/m.  Generalized: Well developed, in no acute distress  Cardiology: normal rate and rhythm, no murmur noted Respiratory: clear to auscultation bilaterally  Neurological examination  Mentation: Alert oriented to time, place, history taking. Follows all commands speech and language fluent Cranial nerve II-XII: Pupils were equal round reactive to light. Extraocular movements were full, visual field were full  Motor: The motor testing reveals 5 over 5 strength of all 4 extremities. Good symmetric motor tone is noted throughout.  Gait and station: Gait is normal.    DIAGNOSTIC DATA (LABS, IMAGING, TESTING) - I reviewed patient records, labs, notes, testing and imaging myself where available.      No data to display           Lab Results  Component Value Date   WBC 13.8 (H) 08/27/2023   HGB 11.3 (L) 08/27/2023   HCT 35.3 (L) 08/27/2023   MCV 91.2 08/27/2023   PLT 257 08/27/2023      Component Value Date/Time   NA 134 (L) 08/27/2023 0956   NA 142 12/25/2021 1001   K 3.9 08/27/2023 0956   CL 95 (L) 08/27/2023 0956   CO2 24 08/27/2023 0956   GLUCOSE 133 (H) 08/27/2023 0956   BUN 7 (L) 08/27/2023 0956   BUN 16 12/25/2021 1001   CREATININE 0.90 08/27/2023 0956   CREATININE 0.87 08/07/2022 1457   CALCIUM  9.2  08/27/2023 0956   PROT 7.2 03/13/2023 1426   PROT 6.2 07/28/2018 0756   ALBUMIN 4.0 03/13/2023 1426   ALBUMIN 4.0 07/28/2018 0756   AST 15 03/13/2023 1426   AST 20 03/12/2021 1332   ALT 10 03/13/2023 1426   ALT 13 03/12/2021 1332   ALKPHOS 68 03/13/2023 1426   BILITOT 0.2 03/13/2023 1426   BILITOT 0.3 03/12/2021 1332   GFRNONAA >60 08/27/2023 0956   GFRNONAA >60 03/12/2021 1332   GFRAA 82 05/18/2020 0000   Lab Results  Component Value Date  CHOL 180 06/08/2022   HDL 67 06/08/2022   LDLCALC 101 (H) 06/08/2022   TRIG 62 06/08/2022   CHOLHDL 2.7 06/08/2022   Lab Results  Component Value Date   HGBA1C 6.1 (H) 06/08/2022   Lab Results  Component Value Date   VITAMINB12 >1537 (H) 01/08/2023   Lab Results  Component Value Date   TSH 3.771 06/09/2022     ASSESSMENT AND PLAN 78 y.o. year old female  has a past medical history of Achalasia (09/28/2012), Allergic rhinitis (03/01/2015), Anemia, Aneurysm (HCC) (03/01/2015), BP (high blood pressure) (03/01/2015), Bronchitis (04/23/2018), Cephalalgia (08/24/2012), Chest pain (04/04/2011), CN (constipation) (09/28/2012), Coronary artery disease, Coughing (04/24/2017), Decreased potassium in the blood (03/01/2015), Diverticulosis, Dizziness (03/01/2015), Erythropoietin  deficiency anemia (03/13/2021), GERD (gastroesophageal reflux disease), Hiatal hernia, Hypercholesterolemia (03/01/2015), Hyperlipidemia, Hypertension, Ingrown toenail (08/10/2017), Inguinal hernia, Iron deficiency anemia (03/13/2021), Migraine headache, Onychomycosis (12/08/2017), Paresthesia of arm (03/01/2015), PE (pulmonary thromboembolism) (HCC) (03/13/2020), Schatzki's ring, Sleep apnea, Tendonitis, Achilles, right (12/08/2017), TIA (transient ischemic attack), and Unilateral primary osteoarthritis, left knee (10/07/2017). here with     ICD-10-CM   1. OSA on CPAP  G47.33 For home use only DME continuous positive airway pressure (CPAP)    2. Chronic migraine without  aura without status migrainosus, not intractable  G43.709     3. Aneurysm (HCC)  I72.9        Stephanie Parks is doing well on CPAP therapy. Compliance report reveals excellent compliance. She was encouraged to continue using CPAP nightly and for greater than 4 hours each night. Patient will monitor for air leak. We encouraged the patient to reach out to her DME company about her issues with the headgear. We will update supply orders as indicated. Risks of untreated sleep apnea review and education materials provided. Migraines are doing much better with the nortriptyline  and we will continue this for her migraines. We discussed possible weaning to 25mg  daily for 4-6 weeks then we could reassess if she wishes. PCP currently refilling nortriptyline  but I am happy to send if needed. She was encouraged to monitor BP and pulse at home. Contact PCP if readings remain elevated or if lightheaded feeling does not resolve. Increase water intake. Healthy lifestyle habits encouraged. She will follow up in 1 year, sooner if needed. She verbalizes understanding and agreement with this plan.    Orders Placed This Encounter  Procedures   For home use only DME continuous positive airway pressure (CPAP)    Heated Humidity with all supplies as needed    Length of Need:   Lifetime    Patient has OSA or probable OSA:   Yes    Is the patient currently using CPAP in the home:   Yes    Settings:   Other see comments    CPAP supplies needed:   Mask, headgear, cushions, filters, heated tubing and water chamber     No orders of the defined types were placed in this encounter.   I personally spent a total of 30 minutes in the care of the patient today including preparing to see the patient, getting/reviewing separately obtained history, performing a medically appropriate exam/evaluation, counseling and educating, placing orders, documenting clinical information in the EHR, independently interpreting results, and  communicating results.   Greig Forbes, FNP-C 10/01/2023, 3:17 PM [facility] [address], [address] [place], [place] [address] [phone]

## 2023-10-01 NOTE — Patient Instructions (Signed)
 Please continue using your CPAP regularly. While your insurance requires that you use CPAP at least 4 hours each night on 70% of the nights, I recommend, that you not skip any nights and use it throughout the night if you can. Getting used to CPAP and staying with the treatment long term does take time and patience and discipline. Untreated obstructive sleep apnea when it is moderate to severe can have an adverse impact on cardiovascular health and raise her risk for heart disease, arrhythmias, hypertension, congestive heart failure, stroke and diabetes. Untreated obstructive sleep apnea causes sleep disruption, nonrestorative sleep, and sleep deprivation. This can have an impact on your day to day functioning and cause daytime sleepiness and impairment of cognitive function, memory loss, mood disturbance, and problems focussing. Using CPAP regularly can improve these symptoms.  We will update supply orders, today. Consider reducing dose of nortriptyline  to 25mg  daily (1 tablet). Wait about 4-6 weeks and if headaches remain well managed we can discuss reducing dose again. Call me if you want to reduce dose and I will help you. If you feel headaches worsen, resume 50mg  daily (2 tablets).   Follow up in 1 year   GENERAL HEADACHE INFORMATION:   Natural supplements: Magnesium  Oxide or Magnesium  Glycinate 500 mg at bed (up to 800 mg daily) Coenzyme Q10 300 mg in AM Vitamin B2- 200 mg twice a day   Add 1 supplement at a time since even natural supplements can have undesirable side effects. You can sometimes buy supplements cheaper (especially Coenzyme Q10) at www.WebmailGuide.co.za or at [facility].  Migraine with aura: There is increased risk for stroke in women with migraine with aura and a contraindication for the combined contraceptive pill for use by women who have migraine with aura. The risk for women with migraine without aura is lower. However other risk factors like smoking are far more likely to increase  stroke risk than migraine. There is a recommendation for no smoking and for the use of OCPs without estrogen such as progestogen only pills particularly for women with migraine with aura.SABRA People who have migraine headaches with auras may be 3 times more likely to have a stroke caused by a blood clot, compared to migraine patients who don't see auras. Women who take hormone-replacement therapy may be 30 percent more likely to suffer a clot-based stroke than women not taking medication containing estrogen. Other risk factors like smoking and high blood pressure may be  much more important.    Vitamins and herbs that show potential:   Magnesium : Magnesium  (250 mg twice a day or 500 mg at bed) has a relaxant effect on smooth muscles such as blood vessels. Individuals suffering from frequent or daily headache usually have low magnesium  levels which can be increase with daily supplementation of 400-750 mg. Three trials found 40-90% average headache reduction  when used as a preventative. Magnesium  may help with headaches are aura, the best evidence for magnesium  is for migraine with aura is its thought to stop the cortical spreading depression we believe is the pathophysiology of migraine aura.Magnesium  also demonstrated the benefit in menstrually related migraine.  Magnesium  is part of the messenger system in the serotonin cascade and it is a good muscle relaxant.  It is also useful for constipation which can be a side effect of other medications used to treat migraine. Good sources include nuts, whole grains, and tomatoes. Side Effects: loose stool/diarrhea  Riboflavin (vitamin B 2) 200 mg twice a day. This vitamin assists nerve  cells in the production of ATP a principal energy storing molecule.  It is necessary for many chemical reactions in the body.  There have been at least 3 clinical trials of riboflavin using 400 mg per day all of which suggested that migraine frequency can be decreased.  All 3 trials  showed significant improvement in over half of migraine sufferers.  The supplement is found in bread, cereal, milk, meat, and poultry.  Most Americans get more riboflavin than the recommended daily allowance, however riboflavin deficiency is not necessary for the supplements to help prevent headache. Side effects: energizing, green urine   Coenzyme Q10: This is present in almost all cells in the body and is critical component for the conversion of energy.  Recent studies have shown that a nutritional supplement of CoQ10 can reduce the frequency of migraine attacks by improving the energy production of cells as with riboflavin.  Doses of 150 mg twice a day have been shown to be effective.   Melatonin: Increasing evidence shows correlation between melatonin secretion and headache conditions.  Melatonin supplementation has decreased headache intensity and duration.  It is widely used as a sleep aid.  Sleep is natures way of dealing with migraine.  A dose of 3 mg is recommended to start for headaches including cluster headache. Higher doses up to 15 mg has been reviewed for use in Cluster headache and have been used. The rationale behind using melatonin for cluster is that many theories regarding the cause of Cluster headache center around the disruption of the normal circadian rhythm in the brain.  This helps restore the normal circadian rhythm.   HEADACHE DIET: Foods and beverages which may trigger migraine Note that only 20% of headache patients are food sensitive. You will know if you are food sensitive if you get a headache consistently 20 minutes to 2 hours after eating a certain food. Only cut out a food if it causes headaches, otherwise you might remove foods you enjoy! What matters most for diet is to eat a well balanced healthy diet full of vegetables and low fat protein, and to not miss meals.   Chocolate, other sweets ALL cheeses except cottage and cream cheese Dairy products, yogurt, sour cream,  ice cream Liver Meat extracts (Bovril, Marmite, meat tenderizers) Meats or fish which have undergone aging, fermenting, pickling or smoking. These include: Hotdogs,salami,Lox,sausage, mortadellas,smoked salmon, pepperoni, Pickled herring Pods of broad bean (English beans, Chinese pea pods, [place] (fava) beans, lima and navy beans Ripe avocado, ripe banana Yeast extracts or active yeast preparations such as Brewer's or Fleishman's (commercial bakes goods are permitted) Tomato based foods, pizza (lasagna, etc.)   MSG (monosodium glutamate) is disguised as many things; look for these common aliases: Monopotassium glutamate Autolysed yeast Hydrolysed protein Sodium caseinate "flavorings" "all natural preservatives Nutrasweet   Avoid all other foods that convincingly provoke headaches.   Resources: The Dizzy Bluford Aid Your Headache Diet, migrainestrong.com  https://zamora-andrews.com/   Caffeine and Migraine For patients that have migraine, caffeine intake more than 3 days per week can lead to dependency and increased migraine frequency. I would recommend cutting back on your caffeine intake as best you can. The recommended amount of caffeine is 200-300 mg daily, although migraine patients may experience dependency at even lower doses. While you may notice an increase in headache temporarily, cutting back will be helpful for headaches in the long run. For more information on caffeine and migraine, visit: https://americanmigrainefoundation.org/resource-library/caffeine-and-migraine/   Headache Prevention Strategies:   1. Maintain a  headache diary; learn to identify and avoid triggers.  - This can be a simple note where you log when you had a headache, associated symptoms, and medications used - There are several smartphone apps developed to help track migraines: Migraine Buddy, Migraine Monitor, Curelator N1-Headache App   Common triggers  include: Emotional triggers: Emotional/Upset family or friends Emotional/Upset occupation Business reversal/success Anticipation anxiety Crisis-serious Post-crisis periodNew job/position   Physical triggers: Vacation Day Weekend Strenuous Exercise High Altitude Location New Move Menstrual Day Physical Illness Oversleep/Not enough sleep Weather changes Light: Photophobia or light sesnitivity treatment involves a balance between desensitization and reduction in overly strong input. Use dark polarized glasses outside, but not inside. Avoid bright or fluorescent light, but do not dim environment to the point that going into a normally lit room hurts. Consider FL-41 tint lenses, which reduce the most irritating wavelengths without blocking too much light.  These can be obtained at axonoptics.com or theraspecs.com Foods: see list above.   2. Limit use of acute treatments (over-the-counter medications, triptans, etc.) to no more than 2 days per week or 10 days per month to prevent medication overuse headache (rebound headache).     3. Follow a regular schedule (including weekends and holidays): Don't skip meals. Eat a balanced diet. 8 hours of sleep nightly. Minimize stress. Exercise 30 minutes per day. Being overweight is associated with a 5 times increased risk of chronic migraine. Keep well hydrated and drink 6-8 glasses of water per day.   4. Initiate non-pharmacologic measures at the earliest onset of your headache. Rest and quiet environment. Relax and reduce stress. Breathe2Relax is a free app that can instruct you on    some simple relaxtion and breathing techniques. Http://Dawnbuse.com is a    free website that provides teaching videos on relaxation.  Also, there are  many apps that   can be downloaded for "mindful" relaxation.  An app called YOGA NIDRA will help walk you through mindfulness. Another app called Calm can be downloaded to give you a structured mindfulness guide with  daily reminders and skill development. Headspace for guided meditation Mindfulness Based Stress Reduction Online Course: www.palousemindfulness.com Cold compresses.   5. Don't wait!! Take the maximum allowable dosage of prescribed medication at the first sign of migraine.   6. Compliance:  Take prescribed medication regularly as directed and at the first sign of a migraine.   7. Communicate:  Call your physician when problems arise, especially if your headaches change, increase in frequency/severity, or become associated with neurological symptoms (weakness, numbness, slurred speech, etc.). Proceed to emergency room if you experience new or worsening symptoms or symptoms do not resolve, if you have new neurologic symptoms or if headache is severe, or for any concerning symptom.   8. Headache/pain management therapies: Consider various complementary methods, including medication, behavioral therapy, psychological counselling, biofeedback, massage therapy, acupuncture, dry needling, and other modalities.  Such measures may reduce the need for medications. Counseling for pain management, where patients learn to function and ignore/minimize their pain, seems to work very well.   9. Recommend changing family's attention and focus away from patient's headaches. Instead, emphasize daily activities. If first question of day is 'How are your headaches/Do you have a headache today?', then patient will constantly think about headaches, thus making them worse. Goal is to re-direct attention away from headaches, toward daily activities and other distractions.   10. Helpful Websites: www.AmericanHeadacheSociety.org PatentHood.ch www.headaches.org TightMarket.nl www.achenet.org

## 2023-10-06 ENCOUNTER — Telehealth: Payer: Self-pay | Admitting: Family Medicine

## 2023-10-06 ENCOUNTER — Telehealth: Payer: Self-pay

## 2023-10-06 ENCOUNTER — Other Ambulatory Visit (HOSPITAL_COMMUNITY): Payer: Self-pay

## 2023-10-06 NOTE — Telephone Encounter (Signed)
 Copied from CRM #[id number]. Topic: Clinical - Medication Prior Auth >> Oct 06, 2023 12:21 PM Charolett L wrote: Reason for CRM: patient stated that the pharmacy is stating that a prior auth is needed for tirzepatide  (ZEPBOUND ) 2.5 MG/0.5ML Pen

## 2023-10-06 NOTE — Telephone Encounter (Signed)
 Pharmacy Patient Advocate Encounter   Received notification from Pt Calls Messages that prior authorization for Zepbound  2.5MG /0.5ML pen-injectors is required/requested.   Insurance verification completed.   The patient is insured through [facility] .   Per test claim: PA required; PA submitted to above mentioned insurance via Latent Key/confirmation #/EOC [id number] Status is pending

## 2023-10-06 NOTE — Telephone Encounter (Signed)
 PA request has been Submitted. New Encounter has been or will be created for follow up. For additional info see Pharmacy Prior Auth telephone encounter from 10/06/23.

## 2023-10-06 NOTE — Telephone Encounter (Signed)
 Pharmacy Patient Advocate Encounter  Received notification from [employer] that Prior Authorization for Zepbound  2.5MG /0.5ML pen-injectors  has been APPROVED from 10/06/23 to 02/18/24. Ran test claim, Copay is $0. This test claim was processed through [facility]- copay amounts may vary at other pharmacies due to pharmacy/plan contracts, or as the patient moves through the different stages of their insurance plan.   PA #/Case ID/Reference #: [id number]

## 2023-10-07 DIAGNOSIS — E7800 Pure hypercholesterolemia, unspecified: Secondary | ICD-10-CM | POA: Diagnosis not present

## 2023-10-07 DIAGNOSIS — I2694 Multiple subsegmental pulmonary emboli without acute cor pulmonale: Secondary | ICD-10-CM | POA: Diagnosis not present

## 2023-10-07 DIAGNOSIS — Z8673 Personal history of transient ischemic attack (TIA), and cerebral infarction without residual deficits: Secondary | ICD-10-CM | POA: Diagnosis not present

## 2023-10-07 DIAGNOSIS — Z471 Aftercare following joint replacement surgery: Secondary | ICD-10-CM | POA: Diagnosis not present

## 2023-10-07 DIAGNOSIS — M170 Bilateral primary osteoarthritis of knee: Secondary | ICD-10-CM | POA: Diagnosis not present

## 2023-10-07 DIAGNOSIS — G43909 Migraine, unspecified, not intractable, without status migrainosus: Secondary | ICD-10-CM | POA: Diagnosis not present

## 2023-10-07 DIAGNOSIS — G4733 Obstructive sleep apnea (adult) (pediatric): Secondary | ICD-10-CM | POA: Diagnosis not present

## 2023-10-07 DIAGNOSIS — J302 Other seasonal allergic rhinitis: Secondary | ICD-10-CM | POA: Diagnosis not present

## 2023-10-07 DIAGNOSIS — I10 Essential (primary) hypertension: Secondary | ICD-10-CM | POA: Diagnosis not present

## 2023-10-07 DIAGNOSIS — I671 Cerebral aneurysm, nonruptured: Secondary | ICD-10-CM | POA: Diagnosis not present

## 2023-10-07 DIAGNOSIS — D509 Iron deficiency anemia, unspecified: Secondary | ICD-10-CM | POA: Diagnosis not present

## 2023-10-07 DIAGNOSIS — Z79899 Other long term (current) drug therapy: Secondary | ICD-10-CM | POA: Diagnosis not present

## 2023-10-07 DIAGNOSIS — Z7901 Long term (current) use of anticoagulants: Secondary | ICD-10-CM | POA: Diagnosis not present

## 2023-10-07 DIAGNOSIS — I2510 Atherosclerotic heart disease of native coronary artery without angina pectoris: Secondary | ICD-10-CM | POA: Diagnosis not present

## 2023-10-07 DIAGNOSIS — K449 Diaphragmatic hernia without obstruction or gangrene: Secondary | ICD-10-CM | POA: Diagnosis not present

## 2023-10-07 DIAGNOSIS — K5790 Diverticulosis of intestine, part unspecified, without perforation or abscess without bleeding: Secondary | ICD-10-CM | POA: Diagnosis not present

## 2023-10-07 DIAGNOSIS — K5900 Constipation, unspecified: Secondary | ICD-10-CM | POA: Diagnosis not present

## 2023-10-07 DIAGNOSIS — B351 Tinea unguium: Secondary | ICD-10-CM | POA: Diagnosis not present

## 2023-10-07 DIAGNOSIS — Z96643 Presence of artificial hip joint, bilateral: Secondary | ICD-10-CM | POA: Diagnosis not present

## 2023-10-07 DIAGNOSIS — R7303 Prediabetes: Secondary | ICD-10-CM | POA: Diagnosis not present

## 2023-10-08 ENCOUNTER — Ambulatory Visit (INDEPENDENT_AMBULATORY_CARE_PROVIDER_SITE_OTHER): Admitting: Orthopaedic Surgery

## 2023-10-08 ENCOUNTER — Encounter: Payer: Self-pay | Admitting: Orthopaedic Surgery

## 2023-10-08 ENCOUNTER — Telehealth: Payer: Self-pay | Admitting: Pharmacist

## 2023-10-08 DIAGNOSIS — Z96641 Presence of right artificial hip joint: Secondary | ICD-10-CM

## 2023-10-08 DIAGNOSIS — M25562 Pain in left knee: Secondary | ICD-10-CM

## 2023-10-08 DIAGNOSIS — G8929 Other chronic pain: Secondary | ICD-10-CM

## 2023-10-08 NOTE — Progress Notes (Signed)
 The patient is a 78 year old who is now 6 weeks status post a right total hip arthroplasty.  We have replaced her left hip in the past.  She does have known arthritis in her left knee and is requesting steroid injection of the left knee.  She is scheduled to go back to work I believe by September 30.  She is ambulating still with a cane.  She reports increased range of motion and strength.  On examination her left hip moves smoothly and fluidly.  Her more recent right operative hip moves a little less in terms of the smoothness but the incision looks good overall.  She had been having some itching in the groin crease but I see no worrisome features.  Of note her BMI is 37.26 and she does have slight flexion contracture with her knees.  Per her request I did place a steroid injection in her left knee today.  Will see her back in 5 weeks but no x-rays are needed.  We can then give her a return to work note and fill out what ever information she needs for returning to work then

## 2023-10-08 NOTE — Telephone Encounter (Signed)
 Patient LM on VM of Clinical Pharmacist Practitioner asking about prior authorization for Zepbound . Looks like prior authorization was approved 10/06/2023.  Patient was notified. She requesting it be filled at [facility].  [facility]epbound  and cost was $0. They can have ready by this afternoon. Patient notified and provided education on how to inject Zepbound . She is to call if she has any questions.  Will follow up by phone next week to see how she is doing with Zepbound  and discuss dose escalation.

## 2023-10-09 ENCOUNTER — Other Ambulatory Visit: Payer: Self-pay | Admitting: Pharmacist

## 2023-10-17 ENCOUNTER — Telehealth: Payer: Self-pay | Admitting: Pharmacist

## 2023-10-17 ENCOUNTER — Other Ambulatory Visit: Payer: Self-pay | Admitting: Pharmacist

## 2023-10-17 NOTE — Telephone Encounter (Signed)
 Attempt was made to contact patient by phone today for follow up by Clinical Pharmacist regarding diabetes / Zepbound .  Unable to reach patient. Unable to leave message on VM.

## 2023-10-24 ENCOUNTER — Other Ambulatory Visit: Payer: Self-pay | Admitting: Pharmacist

## 2023-10-24 NOTE — Telephone Encounter (Signed)
 Second attempt was made to contact patient by phone today for follow up by Clinical Pharmacist regarding Zepbound  / dose escilation.  Unable to reach patient. Unable to LM on either phone number in chart.

## 2023-11-03 NOTE — Telephone Encounter (Signed)
 Third attempt was made to contact patient by phone today for follow up by Clinical Pharmacist regarding Zepbound  / weight managemetn Unable to reach patient. LM on VM with my contact number [phone].

## 2023-11-12 ENCOUNTER — Other Ambulatory Visit: Payer: Self-pay | Admitting: Family Medicine

## 2023-11-12 DIAGNOSIS — G4733 Obstructive sleep apnea (adult) (pediatric): Secondary | ICD-10-CM

## 2023-11-12 MED ORDER — ZEPBOUND 2.5 MG/0.5ML ~~LOC~~ SOAJ: 2.5000 mg | SUBCUTANEOUS | 0 refills | Status: DC

## 2023-11-12 NOTE — Telephone Encounter (Signed)
 Copied from CRM [id number]. Topic: Clinical - Medication Refill >> Nov 12, 2023  1:53 PM Dedra B wrote: Medication: tirzepatide  (ZEPBOUND ) 2.5 MG/0.5ML Pen  Has the patient contacted their pharmacy? No, no refills   This is the patient's preferred pharmacy:  [id number] [place] [place] - [address] [place] [address] Phone: [phone] Fax: [phone]  Is this the correct pharmacy for this prescription? Yes   Has the prescription been filled recently? No  Is the patient out of the medication? Yes  Has the patient been seen for an appointment in the last year OR does the patient have an upcoming appointment? Yes  Can we respond through MyChart? Yes  Agent: Please be advised that Rx refills may take up to 3 business days. We ask that you follow-up with your pharmacy.

## 2023-11-17 ENCOUNTER — Telehealth: Payer: Self-pay | Admitting: Family Medicine

## 2023-11-17 ENCOUNTER — Ambulatory Visit (INDEPENDENT_AMBULATORY_CARE_PROVIDER_SITE_OTHER): Admitting: Orthopaedic Surgery

## 2023-11-17 DIAGNOSIS — Z96641 Presence of right artificial hip joint: Secondary | ICD-10-CM

## 2023-11-17 DIAGNOSIS — M2170 Unequal limb length (acquired), unspecified site: Secondary | ICD-10-CM

## 2023-11-17 MED ORDER — ZEPBOUND 5 MG/0.5ML ~~LOC~~ SOAJ: 5.0000 mg | SUBCUTANEOUS | 3 refills | Status: DC

## 2023-11-17 NOTE — Telephone Encounter (Signed)
 Called pt and let her know that I placed her order on her appointment date 10/01/2023. Gave pt DME Number to call her DME company [phone] to inquire about her order.

## 2023-11-17 NOTE — Telephone Encounter (Signed)
 Pt called stating that when she was last here, an order for her Cpap supplies was to be sent to her DME. Pt has yet to hear or receive anything from her DME. She would like to know can someone can an update for her on when the Rx is to be sent for her. Please advise.

## 2023-11-17 NOTE — Progress Notes (Signed)
 The patient is a 78 year old who is now right at 3 months status post a right total hip replacement.  We replaced her left hip in the past as well.  She is very active and has paperwork today to allow her to go back to work without restrictions.  She does notice a leg length difference.  When she does lay down flat her right leg is longer than the left side.  Both knees have slight varus malalignment as well.  I did give her a prescription for a custom insert for her left side they will help her stand more even.  We talked about leg length differences as well.  Will see her back in 6 months which is a standing AP pelvis at that visit.

## 2023-11-17 NOTE — Addendum Note (Signed)
 Addended by: WATT RAISIN C on: 11/17/2023 10:59 AM   Modules accepted: Orders

## 2023-11-18 NOTE — Telephone Encounter (Signed)
 Phone room: please call pt and let her know order was already sent 10/02/23 to [facility] and they replied confirming they received. She needs to call [facility] to follow up on getting supplies

## 2023-11-18 NOTE — Telephone Encounter (Signed)
 Pt called stating that the DME informed her that they are needing the office to send an Rx for her supplies. Please advise.

## 2023-11-20 ENCOUNTER — Other Ambulatory Visit (HOSPITAL_BASED_OUTPATIENT_CLINIC_OR_DEPARTMENT_OTHER): Payer: Self-pay

## 2023-11-20 ENCOUNTER — Telehealth: Payer: Self-pay

## 2023-11-20 MED ORDER — FLUZONE HIGH-DOSE 0.5 ML IM SUSY
0.5000 mL | PREFILLED_SYRINGE | Freq: Once | INTRAMUSCULAR | 0 refills | Status: AC
  Filled 2023-11-20: qty 0.5, 1d supply, fill #0

## 2023-11-20 NOTE — Telephone Encounter (Signed)
**Note De-identified  Woolbright Obfuscation** Please advise 

## 2023-11-20 NOTE — Telephone Encounter (Signed)
 Noted

## 2023-11-20 NOTE — Telephone Encounter (Signed)
 LMOM informing per PCP no need to start over on dosing, I did let her know we could send prescription to our pharmacy downstairs if she would like, they can typically get Zepbound  doses easier than other pharmacies.

## 2023-11-20 NOTE — Telephone Encounter (Signed)
 Copied from CRM [id number]. Topic: Clinical - Medication Question >> Nov 20, 2023 11:49 AM Tinnie BROCKS wrote: Reason for CRM: Pt calling to let us  know that she has been unable to take her Zepbound  for 2 weeks because no pharmacy seems to have it in stock. She is wondering if she will have to start all over in dosage. Please give pt a call back at [phone]

## 2023-11-20 NOTE — Telephone Encounter (Signed)
 No I do not think she will need to start over as she is still on a lower dose (5mg )

## 2023-11-26 NOTE — Telephone Encounter (Addendum)
 Phone room: please call pt back. 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 They manage orders first before going to [facility]. Her insurance requires this. Please let her know this and that that we sent order to [facility] as requested. She will still be getting supplies from [facility] still.  Received confirmation from [facility] that they added order to her profile

## 2023-11-26 NOTE — Telephone Encounter (Signed)
 Sent order via send secure email to [facility] at: [email or web address]

## 2023-11-26 NOTE — Telephone Encounter (Signed)
 Pt called stating that [facility] will not be able to give Pt Cpap Supplies due to [facility] stating they do not take PT Insurance . Pt stated that [facility] referred Pt to get supplies through [facility] . Pt is calling to request new order be sent to [facility] for pt supplies  Pt did not have  fax number at time of call

## 2023-12-01 ENCOUNTER — Other Ambulatory Visit: Payer: Self-pay | Admitting: Family Medicine

## 2023-12-01 DIAGNOSIS — I2699 Other pulmonary embolism without acute cor pulmonale: Secondary | ICD-10-CM

## 2023-12-02 ENCOUNTER — Encounter (HOSPITAL_BASED_OUTPATIENT_CLINIC_OR_DEPARTMENT_OTHER): Payer: Self-pay | Admitting: Emergency Medicine

## 2023-12-02 ENCOUNTER — Emergency Department (HOSPITAL_BASED_OUTPATIENT_CLINIC_OR_DEPARTMENT_OTHER)

## 2023-12-02 ENCOUNTER — Other Ambulatory Visit: Payer: Self-pay

## 2023-12-02 ENCOUNTER — Emergency Department (HOSPITAL_BASED_OUTPATIENT_CLINIC_OR_DEPARTMENT_OTHER): Admission: EM | Admit: 2023-12-02 | Discharge: 2023-12-02 | Disposition: A | Discharge status: Home / Self Care

## 2023-12-02 ENCOUNTER — Telehealth: Payer: Self-pay | Admitting: Family Medicine

## 2023-12-02 DIAGNOSIS — R519 Headache, unspecified: Secondary | ICD-10-CM | POA: Insufficient documentation

## 2023-12-02 DIAGNOSIS — Z7901 Long term (current) use of anticoagulants: Secondary | ICD-10-CM | POA: Diagnosis not present

## 2023-12-02 DIAGNOSIS — I2699 Other pulmonary embolism without acute cor pulmonale: Secondary | ICD-10-CM

## 2023-12-02 DIAGNOSIS — I10 Essential (primary) hypertension: Secondary | ICD-10-CM | POA: Diagnosis not present

## 2023-12-02 LAB — CBC WITH DIFFERENTIAL/PLATELET
Abs Immature Granulocytes: 0.02 K/uL (ref 0.00–0.07)
Basophils Absolute: 0 K/uL (ref 0.0–0.1)
Basophils Relative: 0 %
Eosinophils Absolute: 0.3 K/uL (ref 0.0–0.5)
Eosinophils Relative: 4 %
HCT: 34.3 % — ABNORMAL LOW (ref 36.0–46.0)
Hemoglobin: 11.1 g/dL — ABNORMAL LOW (ref 12.0–15.0)
Immature Granulocytes: 0 %
Lymphocytes Relative: 32 %
Lymphs Abs: 2.6 K/uL (ref 0.7–4.0)
MCH: 28 pg (ref 26.0–34.0)
MCHC: 32.4 g/dL (ref 30.0–36.0)
MCV: 86.6 fL (ref 80.0–100.0)
Monocytes Absolute: 0.5 K/uL (ref 0.1–1.0)
Monocytes Relative: 6 %
Neutro Abs: 4.6 K/uL (ref 1.7–7.7)
Neutrophils Relative %: 58 %
Platelets: 340 K/uL (ref 150–400)
RBC: 3.96 MIL/uL (ref 3.87–5.11)
RDW: 15.5 % (ref 11.5–15.5)
WBC: 8.1 K/uL (ref 4.0–10.5)
nRBC: 0 % (ref 0.0–0.2)

## 2023-12-02 LAB — BASIC METABOLIC PANEL WITH GFR
Anion gap: 10 (ref 5–15)
BUN: 14 mg/dL (ref 8–23)
CO2: 30 mmol/L (ref 22–32)
Calcium: 9.7 mg/dL (ref 8.9–10.3)
Chloride: 102 mmol/L (ref 98–111)
Creatinine, Ser: 0.83 mg/dL (ref 0.44–1.00)
GFR, Estimated: >60 mL/min (ref >60)
Glucose, Bld: 89 mg/dL (ref 70–99)
Potassium: 3.5 mmol/L (ref 3.5–5.1)
Sodium: 141 mmol/L (ref 135–145)

## 2023-12-02 MED ORDER — APIXABAN 5 MG PO TABS
5.0000 mg | ORAL_TABLET | Freq: Two times a day (BID) | ORAL | 3 refills | Status: AC
Start: 2023-12-02 — End: ?

## 2023-12-02 MED ORDER — DIPHENHYDRAMINE HCL 50 MG/ML IJ SOLN
12.5000 mg | Freq: Once | INTRAMUSCULAR | Status: AC
  Administered 2023-12-02: 12.5 mg via INTRAVENOUS
  Filled 2023-12-02: qty 1

## 2023-12-02 MED ORDER — METOCLOPRAMIDE HCL 5 MG/ML IJ SOLN
5.0000 mg | Freq: Once | INTRAMUSCULAR | Status: AC
  Administered 2023-12-02: 5 mg via INTRAVENOUS
  Filled 2023-12-02: qty 2

## 2023-12-02 NOTE — Progress Notes (Signed)
 Stephanie Parks                                          MRN: [id number]   12/02/2023   The [facility] Quality Team Specialist reviewed this patient medical record for the purposes of chart review for care gap closure. The following were reviewed: chart review for care gap closure-kidney health evaluation for diabetes:eGFR  and uACR.    VBCI Quality Team

## 2023-12-02 NOTE — ED Provider Notes (Signed)
 [place] [facility] Provider Note   CSN: [id number] Arrival date & time: 12/02/23  1219     Patient presents with: Headache   Stephanie Parks is a 78 y.o. female.   Patient to ED with headache that started around 10:00 this morning. She states she does not have pain but has pressure. It is constant and non-throbbing. No aggravating or alleviating factors. It affects bilateral frontal head without occipital or neck symptoms. No nausea, vomiting, visual change. She does not have difficulty speaking or walking. She reports she started Zepbound  about 4 weeks ago but no other new or changed medications. She took Tylenol  without any relief. She has had similar pressure type headache in the past she associates with having hot flashes. She did have a hot flash at onset that resolved after a brief time, but the headache persisted which is not usual.   The history is provided by the patient and a relative. No language interpreter was used.  Headache      Prior to Admission medications   Medication Sig Start Date End Date Taking? Authorizing Provider  albuterol  (VENTOLIN  HFA) 108 (90 Base) MCG/ACT inhaler Inhale 1-2 puffs into the lungs every 6 (six) hours as needed for wheezing or shortness of breath. 06/22/23   Minnie Tinnie BRAVO, PA  apixaban  (ELIQUIS ) 5 MG TABS tablet Take 1 tablet (5 mg total) by mouth 2 (two) times daily. 12/02/23   Copland, Harlene BROCKS, MD  Calcium  Carb-Cholecalciferol (CALCIUM  600 + D PO) Take 1 tablet by mouth daily.    [provider]  carboxymethylcellulose (REFRESH PLUS) 0.5 % SOLN Place 1 drop into both eyes 3 (three) times daily as needed (dry eyes).    [provider]  carvedilol  (COREG ) 6.25 MG tablet Take 1 tablet (6.25 mg total) by mouth 2 (two) times daily with a meal. 07/02/23   Copland, Harlene BROCKS, MD  Cyanocobalamin  (B-12 PO) Take 2 tablets by mouth daily.    [provider]  ELDERBERRY PO Take 1  capsule by mouth daily.    [provider]  EPINEPHrine  (EPIPEN  2-PAK) 0.3 mg/0.3 mL IJ SOAJ injection Inject 0.3 mg into the muscle as needed for anaphylaxis. Per allergen immunotherapy protocol 03/17/23   Lorin Norris, MD  fluticasone  (FLONASE ) 50 MCG/ACT nasal spray Place 2 sprays into both nostrils daily as needed for allergies or rhinitis. 05/15/23   Copland, Harlene BROCKS, MD  losartan -hydrochlorothiazide  (HYZAAR ) 50-12.5 MG tablet Take 0.5 tablets by mouth daily. 03/13/23   Copland, Jessica C, MD  Menthol , Topical Analgesic, (BIOFREEZE EX) Apply 1 Application topically daily as needed (pain).    [provider]  methocarbamol  (ROBAXIN ) 500 MG tablet Take 1 tablet (500 mg total) by mouth every 6 (six) hours as needed for muscle spasms. 09/08/23   Vernetta Lonni GRADE, MD  Multiple Vitamins-Minerals (ALIVE MULTI-VITAMIN PO) Take 1 tablet by mouth daily.    [provider]  NON FORMULARY Pt uses a cpap nightly    [provider]  nortriptyline  (PAMELOR ) 25 MG capsule Take 2 capsules (50 mg total) by mouth at bedtime. 07/17/23   Copland, Harlene BROCKS, MD  omeprazole  (PRILOSEC) 40 MG capsule Take 1 capsule (40 mg total) by mouth daily. 08/19/22   Copland, Harlene BROCKS, MD  oxyCODONE  (OXY IR/ROXICODONE ) 5 MG immediate release tablet Take 1-2 tablets (5-10 mg total) by mouth every 6 (six) hours as needed for moderate pain (pain score 4-6) (pain score 4-6). 09/08/23   Vernetta,  Lonni GRADE, MD  polyethylene glycol (MIRALAX ) 17 g packet Take 17 g by mouth daily as needed for mild constipation or moderate constipation. 07/01/22   Copland, Harlene BROCKS, MD  potassium chloride  SA (KLOR-CON  M20) 20 MEQ tablet Take 2 tablets (40 mEq total) by mouth daily. 01/15/23   Copland, Harlene BROCKS, MD  rosuvastatin  (CRESTOR ) 20 MG tablet Take 1 tablet (20 mg total) by mouth daily. 04/03/23   Copland, Harlene BROCKS, MD  spironolactone  (ALDACTONE ) 25 MG tablet TAKE 1/2 TABLET BY MOUTH EVERY DAY 09/24/23    Copland, Jessica C, MD  tirzepatide  (ZEPBOUND ) 5 MG/0.5ML Pen Inject 5 mg into the skin once a week. 11/17/23   Copland, Harlene BROCKS, MD  tobramycin  (TOBREX ) 0.3 % ophthalmic solution Place 2 drops into the right eye every 6 (six) hours. 08/12/23   Saguier, [place], PA-C    Allergies: Methylprednisolone , Shellfish allergy, Ace inhibitors, Gabapentin , Pitavastatin, Sulfa antibiotics, and Topiramate     Review of Systems  Neurological:  Positive for headaches.    Updated Vital Signs BP (!) 142/76 (BP Location: Left Arm)   Pulse 84   Temp 98 F (36.7 C) (Oral)   Resp 17   Wt 102.1 kg   SpO2 97%   BMI 37.44 kg/m   Physical Exam Vitals and nursing note reviewed.  Constitutional:      Appearance: She is well-developed.  HENT:     Head: Normocephalic.  Cardiovascular:     Rate and Rhythm: Normal rate and regular rhythm.     Heart sounds: No murmur heard. Pulmonary:     Effort: Pulmonary effort is normal.     Breath sounds: Normal breath sounds. No wheezing, rhonchi or rales.  Abdominal:     General: Bowel sounds are normal.     Palpations: Abdomen is soft.     Tenderness: There is no abdominal tenderness. There is no guarding or rebound.  Musculoskeletal:        General: Normal range of motion.     Cervical back: Normal range of motion and neck supple.  Skin:    General: Skin is warm and dry.  Neurological:     General: No focal deficit present.     Mental Status: She is alert and oriented to person, place, and time.     GCS: GCS eye subscore is 4. GCS verbal subscore is 5. GCS motor subscore is 6.     Cranial Nerves: No cranial nerve deficit, dysarthria or facial asymmetry.     Sensory: No sensory deficit.     Gait: Gait normal.     (all labs ordered are listed, but only abnormal results are displayed) Labs Reviewed  CBC WITH DIFFERENTIAL/PLATELET - Abnormal; Notable for the following components:      Result Value   Hemoglobin 11.1 (*)    HCT 34.3 (*)    All other  components within normal limits  BASIC METABOLIC PANEL WITH GFR    EKG: None  Radiology: CT Head Wo Contrast Result Date: 12/02/2023 EXAM: CT HEAD WITHOUT CONTRAST 12/02/2023 02:56:40 PM TECHNIQUE: CT of the head was performed without the administration of intravenous contrast. Automated exposure control, iterative reconstruction, and/or weight based adjustment of the mA/kV was utilized to reduce the radiation dose to as low as reasonably achievable. COMPARISON: MRI head 06/08/2022 CLINICAL HISTORY: Headache, new onset (Age >= 51y). Pressure headache, no relief despite Tylenol . Hx HTN. Denies congestion, no vision changes. Alert and oriented x 4, denies dizziness, feeling lethargic. FINDINGS: BRAIN AND VENTRICLES: No  acute hemorrhage. No evidence of acute infarct. No hydrocephalus. No extra-axial collection. No mass effect or midline shift. ORBITS: No acute abnormality. SINUSES: No acute abnormality. SOFT TISSUES AND SKULL: No acute soft tissue abnormality. No skull fracture. IMPRESSION: 1. No acute intracranial abnormality. Electronically signed by: Donnice Mania MD 12/02/2023 03:06 PM EDT RP Workstation: [id number]     Procedures   Medications Ordered in the ED  metoCLOPramide  (REGLAN ) injection 5 mg (5 mg Intravenous Given 12/02/23 1345)  diphenhydrAMINE  (BENADRYL ) injection 12.5 mg (12.5 mg Intravenous Given 12/02/23 1344)    Clinical Course as of 12/02/23 1518  Tue Dec 02, 2023  1437 Patient with new pressure type headache, described as bilateral/frontal, gradual onset this morning. No other symptoms. Labs ordered, CT head. Reglan  and benadryl  provided. HA improved on recheck. CT head pending.  [SU]  1516 CT negative. Labs reassuring. Pain is resolved. She has been ambulatory to and from the bathroom without unsteadiness. She is stable for discharge home.  [SU]    Clinical Course User Index [SU] Odell Balls, PA-C                                 Medical Decision Making Amount  and/or Complexity of Data Reviewed Labs: ordered. Radiology: ordered.  Risk Prescription drug management.        Final diagnoses:  Acute nonintractable headache, unspecified headache type    ED Discharge Orders     None          Odell Balls, PA-C 12/02/23 1518    Simon Lavonia SAILOR, MD 12/03/23 [phone]

## 2023-12-02 NOTE — ED Notes (Signed)
 ED Provider at bedside.

## 2023-12-02 NOTE — Telephone Encounter (Signed)
 Copied from CRM [id number]. Topic: Clinical - Medication Refill >> Dec 02, 2023  8:53 AM Vena HERO wrote: Medication: eliquis  5 mg  Has the patient contacted their pharmacy? No (Agent: If no, request that the patient contact the pharmacy for the refill. If patient does not wish to contact the pharmacy document the reason why and proceed with request.) (Agent: If yes, when and what did the pharmacy advise?) No refills left  This is the patient's preferred pharmacy:  [id number] - [place], [place] - [address] [place] [address] Phone: [phone] Fax: [phone]    Is this the correct pharmacy for this prescription? Yes If no, delete pharmacy and type the correct one.   Has the prescription been filled recently? No  Is the patient out of the medication? No  Has the patient been seen for an appointment in the last year OR does the patient have an upcoming appointment? Yes 10/01/2023  Can we respond through MyChart? Yes  Agent: Please be advised that Rx refills may take up to 3 business days. We ask that you follow-up with your pharmacy.

## 2023-12-02 NOTE — ED Triage Notes (Signed)
 Pressure headache , no relief despite tylenol  . Hx HTN . Denies congestion , no vision changes . Alert and oriented x 4 , denies dizziness , feeling lethargic .

## 2023-12-02 NOTE — ED Notes (Signed)
 Patient transported to CT

## 2023-12-02 NOTE — Telephone Encounter (Signed)
 Copied from CRM [id number]. Topic: Clinical - Medication Question >> Dec 02, 2023  8:59 AM Vena HERO wrote: Reason for CRM: pt called in for med refill for eliquis  5 mg because her pharmacy submitted one for her but it was denied. She states she has been taking this medication since last year but I do not see it on her current medication list. She was just seen on 10/01/2023 by dr copland so she's not sure why it isnt on her current meds or why it got denied. Please reach out to pt via mychart if there is more info needed or needs to be given.

## 2023-12-02 NOTE — Discharge Instructions (Signed)
 Follow up with your doctor as needed if your pressure-type headache recurs. Return to the ED as needed.

## 2023-12-04 NOTE — Telephone Encounter (Signed)
 Pt has called to report that she has not heard from [facility] .  Pt was reminded that her DME is [facility] .  Pt states she has not heard from anyone, she is not getting a red message on her machine as a result of being in need of her supplies. Please call pt to discuss.

## 2023-12-08 NOTE — Telephone Encounter (Signed)
 Called and LVM for pt with information. Ok per [employer]. Left office number in message for pt to call back if she had any questions.

## 2023-12-08 NOTE — Telephone Encounter (Signed)
 Noted

## 2023-12-17 ENCOUNTER — Telehealth: Payer: Self-pay | Admitting: Family Medicine

## 2023-12-17 MED ORDER — ZEPBOUND 7.5 MG/0.5ML ~~LOC~~ SOAJ: 7.5000 mg | SUBCUTANEOUS | 2 refills | Status: AC

## 2023-12-17 NOTE — Addendum Note (Signed)
 Addended by: WATT RAISIN C on: 12/17/2023 12:13 PM   Modules accepted: Orders

## 2023-12-17 NOTE — Telephone Encounter (Signed)
 Copied from CRM [id number]. Topic: Clinical - Medication Question >> Dec 17, 2023 11:54 AM Stephanie Parks wrote: Reason for CRM: PT would like to know If the mg can be increased on the zepbound   from 5mg  to 7mg 

## 2023-12-21 ENCOUNTER — Other Ambulatory Visit: Payer: Self-pay | Admitting: Family Medicine

## 2023-12-21 DIAGNOSIS — I10 Essential (primary) hypertension: Secondary | ICD-10-CM

## 2023-12-22 ENCOUNTER — Encounter: Payer: Self-pay | Admitting: Radiology

## 2024-01-02 ENCOUNTER — Other Ambulatory Visit: Payer: Self-pay | Admitting: Family Medicine

## 2024-01-19 ENCOUNTER — Telehealth: Payer: Self-pay

## 2024-01-19 NOTE — Telephone Encounter (Signed)
 Patient called to find out what she needed to do to restart injections. She made it to Schedule C on red. Last dose was 0.5 on 09/25/2023. Patient does have another red vial available but it will expire 02/17/24. Please advise appropriate build up.

## 2024-01-20 NOTE — Telephone Encounter (Signed)
 She will need to mix down to beginning of green and start 0.63mL schedule B for green, schedule A for red if she can come this week

## 2024-01-28 NOTE — Telephone Encounter (Signed)
 VIAL ALREADY MIX DOWN

## 2024-02-04 ENCOUNTER — Ambulatory Visit: Admitting: Internal Medicine

## 2024-02-04 ENCOUNTER — Encounter: Payer: Self-pay | Admitting: Internal Medicine

## 2024-02-04 ENCOUNTER — Other Ambulatory Visit: Payer: Self-pay

## 2024-02-04 VITALS — BP 122/70 | HR 74 | Temp 98.1°F | Resp 20 | Wt 217.5 lb

## 2024-02-04 DIAGNOSIS — J302 Other seasonal allergic rhinitis: Secondary | ICD-10-CM | POA: Diagnosis not present

## 2024-02-04 DIAGNOSIS — K219 Gastro-esophageal reflux disease without esophagitis: Secondary | ICD-10-CM | POA: Diagnosis not present

## 2024-02-04 DIAGNOSIS — J3089 Other allergic rhinitis: Secondary | ICD-10-CM | POA: Diagnosis not present

## 2024-02-04 DIAGNOSIS — G4733 Obstructive sleep apnea (adult) (pediatric): Secondary | ICD-10-CM | POA: Diagnosis not present

## 2024-02-04 DIAGNOSIS — H1010 Acute atopic conjunctivitis, unspecified eye: Secondary | ICD-10-CM

## 2024-02-04 DIAGNOSIS — R051 Acute cough: Secondary | ICD-10-CM

## 2024-02-04 MED ORDER — AZELASTINE HCL 0.1 % NA SOLN: 2.0000 | Freq: Two times a day (BID) | NASAL | 12 refills | Status: AC

## 2024-02-04 MED ORDER — IPRATROPIUM BROMIDE 0.06 % NA SOLN: 2.0000 | Freq: Four times a day (QID) | NASAL | 12 refills | Status: AC

## 2024-02-04 NOTE — Patient Instructions (Addendum)
 Allergic rhinitis, previously on AIT, on CPAP which is contributing to rhinitis symptoms Continue to use flonase  1-2 sprays twice daily. Aim upward and outward  Start Astelin  (azelastine ) use 1-2 sprays in each nostril twice daily. Can use regulary with flonase   Start Atrovent  (ipatopium) nasal spray 1-2 sprays in each nostril up to three times daily AS NEEDED for POST NASAL DRIP/RUNNY NOSE/DRAINAGE.  If you become too dry, use less often.  Can add non-sedating antihstamine if needed-Your options include: Zyrtec (cetirizine) 10 mg, Claritin (loratadine) 10 mg, Xyzal  (levocetirizine) 5 mg or Allegra (fexofenadine) 180 mg daily as needed Continue allergen avoidance measures directed toward dust mite, cockroach, and weed pollen as listed below We will get labs to see if restarting allergy injections is appropriate  - If negative we will call you to schedule updated skin testing  - If positive we can discuss how to restart allergy injections  Allergic conjunctivitis Continue Refresh eyedrops as per your eye doctor  GERD Continue Omeprazole  40mg  daily    PE  - Continue all medications as prescribed by cardiology and follow up with cardiology and pulmonary as planned.   Follow up: We will call you with lab results and next steps

## 2024-02-04 NOTE — Progress Notes (Unsigned)
 FOLLOW UP Date of Service/Encounter:  02/04/2024   Subjective:  Stephanie Parks (DOB: Aug 10, 1945) is a 78 y.o. female who [facility] in re-evaluation of the following: for allergic rhinosinusitis, allergic conjunctivitis, GERD and eustachian tube dysfunction. History obtained from: chart review and patient.  For Review, LV was on 06/16/23 with Wanda Craze, FNP seen for routine follow-up.   Discussed the use of AI scribe software for clinical note transcription with the patient, who gave verbal consent to proceed.  History of Present Illness Stephanie Parks is a 78 year old female who presents with respiratory symptoms and to discuss restarting allergy shots.  Respiratory symptoms - Cough, rhinorrhea, and nasal congestion present for the past couple of weeks - No significant worsening of symptoms since August - Cough occurs in the evening, often after CPAP use, with significant nasal drainage and rhinorrhea - Cough is not triggered during CPAP use but occurs after discontinuation in the morning - No significant drainage down the back of the throat - No orthopnea or dysphagia - Symptom relief with nasal spray and Tylenol   Allergy immunotherapy interruption - Regular allergy shots delayed due to hip replacement surgery in July and history of pulmonary embolism - Last allergy injection administered on August 7th - Presenting to discuss restarting allergy shots  Cpap use - Uses CPAP machine for sleep - Cough occurs after discontinuing CPAP in the morning, not during use  Gastroesophageal reflux disease - History of acid reflux managed with omeprazole   Peripheral edema - Persistent ankle swelling attributed to Zephyr medication - Slight weight loss of approximately ten pounds - History of bounding pulse   Allergies as of 02/04/2024       Reactions   Methylprednisolone  Other (See Comments)   Lightheaded and Hallucinations    Shellfish Allergy Hives, Swelling   Ace Inhibitors Other (See Comments)   Pt cannot recall her reaction but tolerates arb    Gabapentin  Other (See Comments)   headaches   Pitavastatin    Unknown reaction Other Reaction(s): Other (See Comments) ADVERSE REACTION,  Pt unaware of this reaction; states she has been on crestor  in the past with no issues   Sulfa Antibiotics Other (See Comments), Rash   Fine bumps   Topiramate  Palpitations   Heart race,         Medication List        Accurate as of February 04, 2024  3:24 PM. If you have any questions, ask your nurse or doctor.          albuterol  108 (90 Base) MCG/ACT inhaler Commonly known as: VENTOLIN  HFA Inhale 1-2 puffs into the lungs every 6 (six) hours as needed for wheezing or shortness of breath.   ALIVE MULTI-VITAMIN PO Take 1 tablet by mouth daily.   apixaban  5 MG Tabs tablet Commonly known as: Eliquis  Take 1 tablet (5 mg total) by mouth 2 (two) times daily.   B-12 PO Take 2 tablets by mouth daily.   BIOFREEZE EX Apply 1 Application topically daily as needed (pain).   CALCIUM  600 + D PO Take 1 tablet by mouth daily.   carboxymethylcellulose 0.5 % Soln Commonly known as: REFRESH PLUS Place 1 drop into both eyes 3 (three) times daily as needed (dry eyes).   carvedilol  6.25 MG tablet Commonly known as: COREG  Take 1 tablet (6.25 mg total) by mouth 2 (two) times daily with a meal.   ELDERBERRY PO Take 1 capsule by mouth  daily.   EPINEPHrine  0.3 mg/0.3 mL Soaj injection Commonly known as: EpiPen  2-Pak Inject 0.3 mg into the muscle as needed for anaphylaxis. Per allergen immunotherapy protocol   fluticasone  50 MCG/ACT nasal spray Commonly known as: FLONASE  Place 2 sprays into both nostrils daily as needed for allergies or rhinitis.   losartan -hydrochlorothiazide  50-12.5 MG tablet Commonly known as: HYZAAR  Take 0.5 tablets by mouth daily.   methocarbamol  500 MG tablet Commonly known as: ROBAXIN  Take 1  tablet (500 mg total) by mouth every 6 (six) hours as needed for muscle spasms.   NON FORMULARY Pt uses a cpap nightly   nortriptyline  25 MG capsule Commonly known as: PAMELOR  TAKE 2 CAPSULES (50 MG TOTAL) BY MOUTH EVERY DAY AT BEDTIME   omeprazole  40 MG capsule Commonly known as: PRILOSEC Take 1 capsule (40 mg total) by mouth daily.   oxyCODONE  5 MG immediate release tablet Commonly known as: Oxy IR/ROXICODONE  Take 1-2 tablets (5-10 mg total) by mouth every 6 (six) hours as needed for moderate pain (pain score 4-6) (pain score 4-6).   polyethylene glycol 17 g packet Commonly known as: MiraLax  Take 17 g by mouth daily as needed for mild constipation or moderate constipation.   potassium chloride  SA 20 MEQ tablet Commonly known as: Klor-Con  M20 Take 2 tablets (40 mEq total) by mouth daily.   rosuvastatin  20 MG tablet Commonly known as: CRESTOR  Take 1 tablet (20 mg total) by mouth daily.   spironolactone  25 MG tablet Commonly known as: ALDACTONE  TAKE 1/2 TABLET BY MOUTH EVERY DAY   tobramycin  0.3 % ophthalmic solution Commonly known as: Tobrex  Place 2 drops into the right eye every 6 (six) hours.   Zepbound  7.5 MG/0.5ML Pen Generic drug: tirzepatide  Inject 7.5 mg into the skin once a week.       Past Medical History:  Diagnosis Date   Achalasia 09/28/2012   Allergic rhinitis 03/01/2015   Anemia    Aneurysm 03/01/2015   stable 5 mm periopthalmic right ICA aneurysm 03/25/21 CTA   BP (high blood pressure) 03/01/2015   Bronchitis 04/23/2018   Cephalalgia 08/24/2012   Overview:  ICD-10 cut over     Chest pain 04/04/2011   Coronary Ca Score 0, no significant CAD 08/20/18; CP with troponin 4>32 s/p LHC showing normal coronaries 12/29/18   CN (constipation) 09/28/2012   Coronary artery disease    Coughing 04/24/2017   Decreased potassium in the blood 03/01/2015   Diverticulosis    Dizziness 03/01/2015   Erythropoietin  deficiency anemia 03/13/2021   GERD  (gastroesophageal reflux disease)    Hiatal hernia    Hypercholesterolemia 03/01/2015   Hyperlipidemia    Hypertension    Ingrown toenail 08/10/2017   Inguinal hernia    Iron deficiency anemia 03/13/2021   Migraine headache    Onychomycosis 12/08/2017   Paresthesia of arm 03/01/2015   PE (pulmonary thromboembolism) (HCC) 03/13/2020   in setting of + COVID-19, s/p Eliquis  x 3 month (recurrent)   Schatzki's ring    Sleep apnea    uses CPAP   Tendonitis, Achilles, right 12/08/2017   TIA (transient ischemic attack)    Unilateral primary osteoarthritis, left knee 10/07/2017   Past Surgical History:  Procedure Laterality Date   ABDOMINAL HYSTERECTOMY     BACK SURGERY     x 3   CARDIAC CATHETERIZATION  12/29/2018   angiographically normal coronary arteries 12/29/18 ([facility])   CARPAL TUNNEL RELEASE     left wrist   CERVICAL SPINE SURGERY  HEMORRHOID SURGERY     TOTAL HIP ARTHROPLASTY Left 09/04/2021   Procedure: LEFT TOTAL HIP ARTHROPLASTY ANTERIOR APPROACH;  Surgeon: Vernetta Lonni GRADE, MD;  Location: [facility] OR;  Service: Orthopedics;  Laterality: Left;   TOTAL HIP ARTHROPLASTY Right 08/26/2023   Procedure: ARTHROPLASTY, HIP, TOTAL, ANTERIOR APPROACH;  Surgeon: Vernetta Lonni GRADE, MD;  Location: [facility] OR;  Service: Orthopedics;  Laterality: Right;   Otherwise, there have been no changes to her past medical history, surgical history, family history, or social history.  ROS: All others negative except as noted per HPI.   Objective:  BP 122/70   Pulse 74   Temp 98.1 F (36.7 C) (Temporal)   Resp 20   Wt 217 lb 8 oz (98.7 kg)   BMI 36.19 kg/m  Body mass index is 36.19 kg/m. Physical Exam: General Appearance:  Alert, cooperative, no distress, appears stated age  Head:  Normocephalic, without obvious abnormality, atraumatic  Eyes:  Conjunctiva clear, EOM's intact  Nose: Nares normal, hypertrophic turbinates, normal mucosa, no visible anterior polyps, and septum  midline, no rhinnorhea   Throat: Lips, tongue normal; teeth and gums normal, normal posterior oropharynx  Neck: Supple, symmetrical  Lungs:   clear to auscultation bilaterally, Respirations unlabored, no coughing  Heart:  regular rate and rhythm and no murmur, Appears well perfused  Extremities: No edema  Skin: Skin color, texture, turgor normal, no rashes or lesions on visualized portions of skin  Neurologic: No gross deficits     Assessment/Plan    Patient Instructions  Allergic rhinitis, previously on AIT, on CPAP which is contributing to rhinitis symptoms Continue to use flonase  1-2 sprays twice daily. Aim upward and outward  Start Astelin  (azelastine ) use 1-2 sprays in each nostril twice daily. Can use regulary with flonase   Start Atrovent  (ipatopium) nasal spray 1-2 sprays in each nostril up to three times daily AS NEEDED for POST NASAL DRIP/RUNNY NOSE/DRAINAGE.  If you become too dry, use less often.  Can add non-sedating antihstamine if needed-Your options include: Zyrtec (cetirizine) 10 mg, Claritin (loratadine) 10 mg, Xyzal  (levocetirizine) 5 mg or Allegra (fexofenadine) 180 mg daily as needed Continue allergen avoidance measures directed toward dust mite, cockroach, and weed pollen as listed below We will get labs to see if restarting allergy injections is appropriate  - If negative we will call you to schedule updated skin testing  - If positive we can discuss how to restart allergy injections  Allergic conjunctivitis Continue Refresh eyedrops as per your eye doctor  GERD Continue Omeprazole  40mg  daily    PE  - Continue all medications as prescribed by cardiology and follow up with cardiology and pulmonary as planned.   Follow up: We will call you with lab results and next steps   Thank you so much for letting me partake in your care today.  Don't hesitate to reach out if you have any additional concerns!  Hargis Springer, MD  [facility] [place] [place]

## 2024-02-07 LAB — IGE+ALLERGENS ZONE 2(30)
Alternaria Alternata IgE: <0.1 kU/L
Amer Sycamore IgE Qn: <0.1 kU/L
Aspergillus Fumigatus IgE: <0.1 kU/L
Bahia Grass IgE: <0.1 kU/L
Bermuda Grass IgE: <0.1 kU/L
Cat Dander IgE: <0.1 kU/L
Cedar, Mountain IgE: <0.1 kU/L
Cladosporium Herbarum IgE: <0.1 kU/L
Cockroach, American IgE: 0.31 kU/L — AB
Common Silver Birch IgE: <0.1 kU/L
D Farinae IgE: 0.43 kU/L — AB
D Pteronyssinus IgE: 0.34 kU/L — AB
Dog Dander IgE: <0.1 kU/L
Elm, American IgE: <0.1 kU/L
Hickory, White IgE: <0.1 kU/L
IgE (Immunoglobulin E), Serum: 98 [IU]/mL (ref 6–495)
Johnson Grass IgE: <0.1 kU/L
Maple/Box Elder IgE: <0.1 kU/L
Mucor Racemosus IgE: <0.1 kU/L
Mugwort IgE Qn: <0.1 kU/L
Nettle IgE: <0.1 kU/L
Oak, White IgE: <0.1 kU/L
Penicillium Chrysogen IgE: <0.1 kU/L
Pigweed, Rough IgE: <0.1 kU/L
Plantain, English IgE: <0.1 kU/L
Ragweed, Short IgE: <0.1 kU/L
Sheep Sorrel IgE Qn: <0.1 kU/L
Stemphylium Herbarum IgE: <0.1 kU/L
Sweet gum IgE RAST Ql: <0.1 kU/L
Timothy Grass IgE: <0.1 kU/L
White Mulberry IgE: <0.1 kU/L

## 2024-02-09 ENCOUNTER — Ambulatory Visit: Payer: Self-pay | Admitting: Internal Medicine

## 2024-02-09 NOTE — Progress Notes (Signed)
 Blood work was borderline to dust mite and roach.  I recommend skin testing as next step. If skin test is negative I would recommend stopping allergy injections

## 2024-02-26 ENCOUNTER — Telehealth: Payer: Self-pay

## 2024-02-26 ENCOUNTER — Other Ambulatory Visit: Payer: Self-pay | Admitting: Family Medicine

## 2024-02-26 ENCOUNTER — Ambulatory Visit (INDEPENDENT_AMBULATORY_CARE_PROVIDER_SITE_OTHER): Admitting: Family Medicine

## 2024-02-26 VITALS — BP 124/76 | HR 93 | Ht 65.0 in | Wt 213.0 lb

## 2024-02-26 DIAGNOSIS — M7989 Other specified soft tissue disorders: Secondary | ICD-10-CM

## 2024-02-26 DIAGNOSIS — K921 Melena: Secondary | ICD-10-CM | POA: Diagnosis not present

## 2024-02-26 DIAGNOSIS — E785 Hyperlipidemia, unspecified: Secondary | ICD-10-CM

## 2024-02-26 DIAGNOSIS — G4733 Obstructive sleep apnea (adult) (pediatric): Secondary | ICD-10-CM

## 2024-02-26 DIAGNOSIS — Z1329 Encounter for screening for other suspected endocrine disorder: Secondary | ICD-10-CM | POA: Diagnosis not present

## 2024-02-26 DIAGNOSIS — E669 Obesity, unspecified: Secondary | ICD-10-CM | POA: Diagnosis not present

## 2024-02-26 DIAGNOSIS — R202 Paresthesia of skin: Secondary | ICD-10-CM | POA: Diagnosis not present

## 2024-02-26 DIAGNOSIS — I10 Essential (primary) hypertension: Secondary | ICD-10-CM | POA: Diagnosis not present

## 2024-02-26 MED ORDER — ZEPBOUND 10 MG/0.5ML ~~LOC~~ SOAJ: 10.0000 mg | SUBCUTANEOUS | 1 refills | Status: AC

## 2024-02-26 NOTE — Telephone Encounter (Signed)
 Tried to leave message for patient but unable to due to full voicemail.  Please let patient know that I talked with Dr. Lorin and she does not need to stop her nortriptyline  before the skin test appointment.

## 2024-02-26 NOTE — Progress Notes (Addendum)
 [facility] [employer] [facility] [employer] [address], [address] [place], [place] [address] [phone]  Date:  02/26/2024   Name:  Stephanie Parks   DOB:  March 26, 1945   MRN:  [id number]  PCP:  Watt Harlene BROCKS, MD    Chief Complaint: Back Pain (Onset one week/My ankles are swollen onset 'a couple of weeks ago /Tingling and numbness in my hands, and they are very cold. Also my toes. Onset 3 weeks )   History of Present Illness:  Stephanie Parks is a 79 y.o. very pleasant female patient who presents with the following:  Patient seen today with concern of back pain for about a week.  I saw her most recently in August She had a hip replacement in July History of hypertension, hyperlipidemia, allergic rhinitis, OSA on CPAP. She has tended to have frequent ER visits for chest pain likely related to anxiety. She also had COVID-19 complicated by pulmonary embolism January of 2022 -She is no longer using oxygen . Due to recurrent PE in May of 2024 she is now on lifelong eliqus   Lumbar decompression 1998   Lab Results  Component Value Date   HGBA1C 6.1 (H) 06/08/2022   Discussed the use of AI scribe software for clinical note transcription with the patient, who gave verbal consent to proceed.  History of Present Illness Stephanie Parks is a 79 year old female who presents with left-sided back pain, swelling of the ankles, and tingling and cold sensation in hands and feet.  She has been experiencing left-sided back pain radiating to the left breast area for approximately two weeks. The pain occurs intermittently, about three to four times a week, and lasts about fifteen seconds. It is not affected by activities such as bending or lifting and can occur even when sitting still. No radiation down the leg is noted.  Swelling in the ankles has been present for the past couple of weeks, worsening as the day progresses but improving with elevation.  Her ankles look  better in the morning.  She takes spironolactone  and losartan  HCTZ. She has difficulty using compression socks due to tightness.  Tingling and cold sensations in her hands and feet have been present for about a month. Her hands can become so cold that she uses a heat pad for relief. No burning sensations are reported.  She has history of frequent complaints of chest pain and did a nuclear stress in February, which was normal. She is currently taking Eliquis . She has not visited the ER for chest pain in the last several months  She reports using albuterol  a couple of days last week due to shortness of breath, which provided relief. Her blood pressure was unusually low at 109 this morning, which is lower than her usual readings.  She has been on Zepbound  since August or September and has lost weight from 227 to 213 pounds, but would like to lose more weight. She is currently on a 7.5 mg dose.  Wt Readings from Last 3 Encounters:  02/26/24 213 lb (96.6 kg)  02/04/24 217 lb 8 oz (98.7 kg)  12/02/23 225 lb (102.1 kg)    Patient Active Problem List   Diagnosis Date Noted   Status post total replacement of right hip 08/26/2023   Unilateral primary osteoarthritis, right knee 07/02/2023   Multiple subsegmental pulmonary emboli without acute cor pulmonale (HCC) 07/23/2022   TIA (transient ischemic attack) 06/08/2022   Diastolic dysfunction  06/08/2022   Obesity (BMI 30-39.9) 06/08/2022   OSA (obstructive sleep apnea) 06/08/2022   Status post total replacement of left hip 09/04/2021   DJD (degenerative joint disease) 09/04/2021   Iron deficiency anemia 03/13/2021   Erythropoietin  deficiency anemia 03/13/2021   Greater trochanteric bursitis of left hip 10/18/2020   Headache 06/07/2020   Acute respiratory failure with hypoxia (HCC) 05/02/2020   Post-COVID chronic dyspnea 05/02/2020   Pulsatile neck mass 04/26/2020   Pneumonia due to COVID-19 virus 03/12/2020   Headache disorder 01/04/2020    Seasonal allergic conjunctivitis 10/04/2019   Dysfunction of right eustachian tube 10/04/2019   Pre-diabetes 06/02/2019   Seasonal and perennial allergic rhinitis 12/22/2018   Heart palpitations 12/22/2018   History of chest pain 07/20/2018   Brain aneurysm 07/20/2018   Dyslipidemia 05/04/2018   Tendonitis, Achilles, right 12/08/2017   Unilateral primary osteoarthritis, left knee 10/07/2017   Ingrown toenail 08/10/2017   Coughing 04/24/2017   CAD (coronary artery disease) 03/01/2015   Aneurysm 03/01/2015   Dizziness 03/01/2015   Hypokalemia 03/01/2015   Paresthesia of arm 03/01/2015   Achalasia 09/28/2012   Constipation 09/28/2012   Cephalalgia 08/24/2012   Essential hypertension 04/19/2011   Hyperlipidemia 04/19/2011   GERD (gastroesophageal reflux disease) 04/19/2011   Chest pain 04/04/2011    Past Medical History:  Diagnosis Date   Achalasia 09/28/2012   Allergic rhinitis 03/01/2015   Anemia    Aneurysm 03/01/2015   stable 5 mm periopthalmic right ICA aneurysm 03/25/21 CTA   BP (high blood pressure) 03/01/2015   Bronchitis 04/23/2018   Cephalalgia 08/24/2012   Overview:  ICD-10 cut over     Chest pain 04/04/2011   Coronary Ca Score 0, no significant CAD 08/20/18; CP with troponin 4>32 s/p LHC showing normal coronaries 12/29/18   CN (constipation) 09/28/2012   Coronary artery disease    Coughing 04/24/2017   Decreased potassium in the blood 03/01/2015   Diverticulosis    Dizziness 03/01/2015   Erythropoietin  deficiency anemia 03/13/2021   GERD (gastroesophageal reflux disease)    Hiatal hernia    Hypercholesterolemia 03/01/2015   Hyperlipidemia    Hypertension    Ingrown toenail 08/10/2017   Inguinal hernia    Iron deficiency anemia 03/13/2021   Migraine headache    Onychomycosis 12/08/2017   Paresthesia of arm 03/01/2015   PE (pulmonary thromboembolism) (HCC) 03/13/2020   in setting of + COVID-19, s/p Eliquis  x 3 month (recurrent)   Schatzki's ring    Sleep  apnea    uses CPAP   Tendonitis, Achilles, right 12/08/2017   TIA (transient ischemic attack)    Unilateral primary osteoarthritis, left knee 10/07/2017    Past Surgical History:  Procedure Laterality Date   ABDOMINAL HYSTERECTOMY     BACK SURGERY     x 3   CARDIAC CATHETERIZATION  12/29/2018   angiographically normal coronary arteries 12/29/18 ([facility])   CARPAL TUNNEL RELEASE     left wrist   CERVICAL SPINE SURGERY     HEMORRHOID SURGERY     TOTAL HIP ARTHROPLASTY Left 09/04/2021   Procedure: LEFT TOTAL HIP ARTHROPLASTY ANTERIOR APPROACH;  Surgeon: Vernetta Lonni GRADE, MD;  Location: [facility] OR;  Service: Orthopedics;  Laterality: Left;   TOTAL HIP ARTHROPLASTY Right 08/26/2023   Procedure: ARTHROPLASTY, HIP, TOTAL, ANTERIOR APPROACH;  Surgeon: Vernetta Lonni GRADE, MD;  Location: [facility] OR;  Service: Orthopedics;  Laterality: Right;    Social History[1]  Family History  Problem Relation Age of Onset   Allergic rhinitis Sister  Diabetes Sister    Hypertension Sister    Heart attack Father 46   Heart disease Father    Stomach cancer Maternal Grandmother    Heart disease Mother    Colon cancer Neg Hx    Angioedema Neg Hx    Asthma Neg Hx    Eczema Neg Hx    Urticaria Neg Hx    Immunodeficiency Neg Hx    Breast cancer Neg Hx    Migraines Neg Hx    Headache Neg Hx     Allergies[2]  Medication list has been reviewed and updated.  Medications Ordered Prior to Encounter[3]  Review of Systems:  As per HPI- otherwise negative.   Physical Examination: Vitals:   02/26/24 1509  BP: 124/76  Pulse: 93  SpO2: 95%   Vitals:   02/26/24 1509  Weight: 213 lb (96.6 kg)  Height: 5' 5 (1.651 m)   Body mass index is 35.45 kg/m. Ideal Body Weight: Weight in (lb) to have BMI = 25: 149.9  GEN: no acute distress.  Obese, looks well HEENT: Atraumatic, Normocephalic.  Ears and Nose: No external deformity. CV: RRR, No M/G/R. No JVD. No thrill. No extra heart  sounds. PULM: CTA B, no wheezes, crackles, rhonchi. No retractions. No resp. distress. No accessory muscle use. ABD: S, NT, ND, +BS. No rebound. No HSM. EXTR: No c/c/e PSYCH: Normally interactive. Conversant.  I am able to reproduce her pain by pressing on the muscles of her left lower back  Assessment and Plan: Obstructive sleep apnea syndrome - Plan: tirzepatide  (ZEPBOUND ) 10 MG/0.5ML Pen  Swelling of lower extremity - Plan: B Nat Peptide  Essential hypertension - Plan: Comprehensive metabolic panel with GFR  Hyperlipidemia, unspecified hyperlipidemia type - Plan: Lipid panel  Thyroid  disorder screening - Plan: TSH  Obesity (BMI 30-39.9) - Plan: Hemoglobin A1c  Tingling of both feet - Plan: Vitamin B12, Ferritin  Blood in stool - Plan: CBC  Assessment & Plan Chronic back pain Intermittent left lower back pain radiating to the left breast, likely musculoskeletal. Recent cardiac evaluation negative for significant issues. - Continue to monitor symptoms and report if pain worsens or becomes more frequent.  Right now her episodes of pain last about 15 seconds every few days so I do not think there is medication that would be helpful for management of symptoms  Lower extremity edema Bilateral swelling, worse during the day, improving with elevation. Current diuretic regimen includes spironolactone  and losartan -hydrochlorothiazide . - Try different types of compression stockings to find a comfortable fit.  Peripheral neuropathy (suspected) Tingling and cold sensation in hands and feet suggestive of neuropathy. Numbness in right thigh likely post-surgical. - Contact neurologist for evaluation and possible nerve conduction testing.  Obesity Weight loss from 227 lbs to 213 lbs since starting Zepbound . Current dose is 7.5 mg, not at maximum dose. Appetite suppressed. - Increase Zepbound  dose to 10 mg after current supply is finished. - Continue to monitor weight loss  progress.  Essential hypertension Blood pressure well-controlled with current medication regimen. Recent reading of 109/70 mmHg. - Continue current antihypertensive regimen.  Obstructive sleep apnea syndrome Recent increase in CPAP events noted. CPAP management by neurologist. - Contact neurologist for evaluation of CPAP events.  General Health Maintenance Recent blood work in October. A1c, cholesterol, and thyroid  levels not checked recently. - Ordered A1c, cholesterol, and thyroid  tests.  Signed Harlene Schroeder, MD  Received labs 1/9- message to pt  Results for orders placed or performed in visit on 02/26/24  Hemoglobin A1c   Collection Time: 02/26/24  3:43 PM  Result Value Ref Range   Hgb A1c MFr Bld 5.9 4.6 - 6.5 %  Lipid panel   Collection Time: 02/26/24  3:43 PM  Result Value Ref Range   Cholesterol 146 28 - 200 mg/dL   Triglycerides 11.9 89.9 - 149.0 mg/dL   HDL 46.59 >60.99 mg/dL   VLDL 82.3 0.0 - 59.9 mg/dL   LDL Cholesterol 75 10 - 99 mg/dL   Total CHOL/HDL Ratio 3    NonHDL 92.41   TSH   Collection Time: 02/26/24  3:43 PM  Result Value Ref Range   TSH 3.14 0.35 - 5.50 uIU/mL  Comprehensive metabolic panel with GFR   Collection Time: 02/26/24  3:43 PM  Result Value Ref Range   Sodium 140 135 - 145 mEq/L   Potassium 3.7 3.5 - 5.1 mEq/L   Chloride 101 96 - 112 mEq/L   CO2 31 19 - 32 mEq/L   Glucose, Bld 85 70 - 99 mg/dL   BUN 18 6 - 23 mg/dL   Creatinine, Ser 8.74 (H) 0.40 - 1.20 mg/dL   Total Bilirubin 0.4 0.2 - 1.2 mg/dL   Alkaline Phosphatase 64 39 - 117 U/L   AST 14 5 - 37 U/L   ALT 8 3 - 35 U/L   Total Protein 6.8 6.0 - 8.3 g/dL   Albumin 4.1 3.5 - 5.2 g/dL   GFR 58.67 (L) >39.99 mL/min   Calcium  9.4 8.4 - 10.5 mg/dL  B Nat Peptide   Collection Time: 02/26/24  3:43 PM  Result Value Ref Range   Pro B Natriuretic peptide (BNP) 14.0 1.0 - 100.0 pg/mL  Vitamin B12   Collection Time: 02/26/24  3:43 PM  Result Value Ref Range   Vitamin B-12 418  211 - 911 pg/mL  Ferritin   Collection Time: 02/26/24  3:43 PM  Result Value Ref Range   Ferritin 17.8 10.0 - 291.0 ng/mL  CBC   Collection Time: 02/26/24  3:43 PM  Result Value Ref Range   WBC 8.2 4.0 - 10.5 K/uL   RBC 3.84 (L) 3.87 - 5.11 Mil/uL   Platelets 365.0 150.0 - 400.0 K/uL   Hemoglobin 11.2 (L) 12.0 - 15.0 g/dL   HCT 66.9 (L) 63.9 - 53.9 %   MCV 86.1 78.0 - 100.0 fl   MCHC 33.8 30.0 - 36.0 g/dL   RDW 84.0 (H) 88.4 - 84.4 %   *Note: Due to a large number of results and/or encounters for the requested time period, some results have not been displayed. A complete set of results can be found in Results Review.        [1]  Social History Tobacco Use   Smoking status: Former    Current packs/day: 0.00    Average packs/day: 0.5 packs/day for 20.0 years (10.0 ttl pk-yrs)    Types: Cigarettes    Start date: 02/19/1964    Quit date: 02/19/1984    Years since quitting: 40.0   Smokeless tobacco: Never  Vaping Use   Vaping status: Never Used  Substance Use Topics   Alcohol  use: No   Drug use: No  [2]  Allergies Allergen Reactions   Methylprednisolone  Other (See Comments)    Lightheaded and Hallucinations   Shellfish Allergy  Hives and Swelling   Ace Inhibitors Other (See Comments)    Pt cannot recall her reaction but tolerates arb    Gabapentin  Other (See Comments)    headaches  Pitavastatin     Unknown reaction  Other Reaction(s): Other (See Comments)  ADVERSE REACTION,  Pt unaware of this reaction; states she has been on crestor  in the past with no issues   Sulfa Antibiotics Other (See Comments) and Rash    Fine bumps   Topiramate  Palpitations    Heart race,   [3]  Current Outpatient Medications on File Prior to Visit  Medication Sig Dispense Refill   albuterol  (VENTOLIN  HFA) 108 (90 Base) MCG/ACT inhaler Inhale 1-2 puffs into the lungs every 6 (six) hours as needed for wheezing or shortness of breath. 8 g 0   apixaban  (ELIQUIS ) 5 MG TABS tablet Take 1  tablet (5 mg total) by mouth 2 (two) times daily. 180 tablet 3   azelastine  (ASTELIN ) 0.1 % nasal spray Place 2 sprays into both nostrils 2 (two) times daily. Use in each nostril as directed 30 mL 12   Calcium  Carb-Cholecalciferol (CALCIUM  600 + D PO) Take 1 tablet by mouth daily.     carboxymethylcellulose (REFRESH PLUS) 0.5 % SOLN Place 1 drop into both eyes 3 (three) times daily as needed (dry eyes).     carvedilol  (COREG ) 6.25 MG tablet Take 1 tablet (6.25 mg total) by mouth 2 (two) times daily with a meal. 180 tablet 0   Cyanocobalamin  (B-12 PO) Take 2 tablets by mouth daily.     ELDERBERRY PO Take 1 capsule by mouth daily.     EPINEPHrine  (EPIPEN  2-PAK) 0.3 mg/0.3 mL IJ SOAJ injection Inject 0.3 mg into the muscle as needed for anaphylaxis. Per allergen immunotherapy protocol 1 each 0   fluticasone  (FLONASE ) 50 MCG/ACT nasal spray Place 2 sprays into both nostrils daily as needed for allergies or rhinitis. 48 g 1   ipratropium (ATROVENT ) 0.06 % nasal spray Place 2 sprays into both nostrils 4 (four) times daily. 15 mL 12   losartan -hydrochlorothiazide  (HYZAAR ) 50-12.5 MG tablet Take 0.5 tablets by mouth daily. 45 tablet 3   Menthol , Topical Analgesic, (BIOFREEZE EX) Apply 1 Application topically daily as needed (pain).     methocarbamol  (ROBAXIN ) 500 MG tablet Take 1 tablet (500 mg total) by mouth every 6 (six) hours as needed for muscle spasms. 30 tablet 0   Multiple Vitamins-Minerals (ALIVE MULTI-VITAMIN PO) Take 1 tablet by mouth daily.     NON FORMULARY Pt uses a cpap nightly     nortriptyline  (PAMELOR ) 25 MG capsule TAKE 2 CAPSULES (50 MG TOTAL) BY MOUTH EVERY DAY AT BEDTIME 180 capsule 1   omeprazole  (PRILOSEC) 40 MG capsule Take 1 capsule (40 mg total) by mouth daily. 90 capsule 3   oxyCODONE  (OXY IR/ROXICODONE ) 5 MG immediate release tablet Take 1-2 tablets (5-10 mg total) by mouth every 6 (six) hours as needed for moderate pain (pain score 4-6) (pain score 4-6). 30 tablet 0    polyethylene glycol (MIRALAX ) 17 g packet Take 17 g by mouth daily as needed for mild constipation or moderate constipation. 90 packet 2   potassium chloride  SA (KLOR-CON  M20) 20 MEQ tablet Take 2 tablets (40 mEq total) by mouth daily. 180 tablet 1   rosuvastatin  (CRESTOR ) 20 MG tablet Take 1 tablet (20 mg total) by mouth daily. 90 tablet 3   spironolactone  (ALDACTONE ) 25 MG tablet TAKE 1/2 TABLET BY MOUTH EVERY DAY 45 tablet 3   tirzepatide  (ZEPBOUND ) 7.5 MG/0.5ML Pen Inject 7.5 mg into the skin once a week. 2 mL 2   tobramycin  (TOBREX ) 0.3 % ophthalmic solution Place 2 drops into the right  eye every 6 (six) hours. 5 mL 0   No current facility-administered medications on file prior to visit.   "

## 2024-02-26 NOTE — Patient Instructions (Addendum)
 Good to see you today Please schedule a visit with your neurologist to discuss your CPAP and also the cold feeling/ tingling in your hands and feet.  I will check your B12 and ferritin/ iron levels I do recommend that you get a tetanus booster and shingles vaccine series at your pharmacy at your convenience I will be in touch with your labs You might experiment with some other brands/ types of compression socks to see if you find some you can wear to reduce swelling

## 2024-02-27 ENCOUNTER — Encounter: Payer: Self-pay | Admitting: Family Medicine

## 2024-02-27 LAB — CBC
HCT: 33 % — ABNORMAL LOW (ref 36.0–46.0)
Hemoglobin: 11.2 g/dL — ABNORMAL LOW (ref 12.0–15.0)
MCHC: 33.8 g/dL (ref 30.0–36.0)
MCV: 86.1 fl (ref 78.0–100.0)
Platelets: 365 K/uL (ref 150.0–400.0)
RBC: 3.84 Mil/uL — ABNORMAL LOW (ref 3.87–5.11)
RDW: 15.9 % — ABNORMAL HIGH (ref 11.5–15.5)
WBC: 8.2 K/uL (ref 4.0–10.5)

## 2024-02-27 LAB — COMPREHENSIVE METABOLIC PANEL WITH GFR
ALT: 8 U/L (ref 3–35)
AST: 14 U/L (ref 5–37)
Albumin: 4.1 g/dL (ref 3.5–5.2)
Alkaline Phosphatase: 64 U/L (ref 39–117)
BUN: 18 mg/dL (ref 6–23)
CO2: 31 meq/L (ref 19–32)
Calcium: 9.4 mg/dL (ref 8.4–10.5)
Chloride: 101 meq/L (ref 96–112)
Creatinine, Ser: 1.25 mg/dL — ABNORMAL HIGH (ref 0.40–1.20)
GFR: 41.32 mL/min — ABNORMAL LOW
Glucose, Bld: 85 mg/dL (ref 70–99)
Potassium: 3.7 meq/L (ref 3.5–5.1)
Sodium: 140 meq/L (ref 135–145)
Total Bilirubin: 0.4 mg/dL (ref 0.2–1.2)
Total Protein: 6.8 g/dL (ref 6.0–8.3)

## 2024-02-27 LAB — TSH: TSH: 3.14 u[IU]/mL (ref 0.35–5.50)

## 2024-02-27 LAB — LIPID PANEL
Cholesterol: 146 mg/dL (ref 28–200)
HDL: 53.4 mg/dL
LDL Cholesterol: 75 mg/dL (ref 10–99)
NonHDL: 92.41
Total CHOL/HDL Ratio: 3
Triglycerides: 88 mg/dL (ref 10.0–149.0)
VLDL: 17.6 mg/dL (ref 0.0–40.0)

## 2024-02-27 LAB — FERRITIN: Ferritin: 17.8 ng/mL (ref 10.0–291.0)

## 2024-02-27 LAB — HEMOGLOBIN A1C: Hgb A1c MFr Bld: 5.9 % (ref 4.6–6.5)

## 2024-02-27 LAB — BRAIN NATRIURETIC PEPTIDE: Pro B Natriuretic peptide (BNP): 14 pg/mL (ref 1.0–100.0)

## 2024-02-27 LAB — VITAMIN B12: Vitamin B-12: 418 pg/mL (ref 211–911)

## 2024-03-01 ENCOUNTER — Ambulatory Visit: Admitting: Family Medicine

## 2024-03-01 NOTE — Telephone Encounter (Signed)
 Left message on voicemail letting patient know that she does not have to stop the medication we talked about prior to her skin test.  Asked her to give us  a call if she has any questions.

## 2024-03-03 ENCOUNTER — Ambulatory Visit: Admitting: Internal Medicine

## 2024-03-03 DIAGNOSIS — J3089 Other allergic rhinitis: Secondary | ICD-10-CM | POA: Diagnosis not present

## 2024-03-03 DIAGNOSIS — J302 Other seasonal allergic rhinitis: Secondary | ICD-10-CM | POA: Diagnosis not present

## 2024-03-03 NOTE — Progress Notes (Signed)
" °  Date of Service/Encounter:  03/03/2024  Allergy  testing appointment   Initial visit on 02/04/24, seen for allergic rhinitis on AIT, retesting due to question to stopping injections. .  Please see that note for additional details.  Today reports for allergy  diagnostic testing:    DIAGNOSTICS:  Skin Testing: Environmental allergy  panel. Adequate positive and negative controls Results discussed with patient/family.  Airborne Adult Perc - 03/03/24 0920     Time Antigen Placed 0920    Allergen Manufacturer Jestine    Location Back    Number of Test 55    1. Control-Buffer 50% Glycerol Negative    2. Control-Histamine 3+    3. Bahia Negative    4. Bermuda Negative    5. Johnson Negative    6. [place]  Blue Negative    7. Meadow Fescue Negative    8. Perennial Rye Negative    9. Timothy Negative    10. Ragweed Mix Negative    11. Cocklebur Negative    12. Plantain,  English Negative    13. Baccharis Negative    14. Dog Fennel Negative    15. Russian Thistle Negative    16. Lamb's Quarters Negative    17. Sheep Sorrell Negative    18. Rough Pigweed Negative    19. Marsh Elder, Rough Negative    20. Mugwort, Common Negative    21. Box, Elder Negative    22. Cedar, red Negative    23. Sweet Gum Negative    24. Pecan Pollen Negative    25. Pine Mix Negative    26. Walnut, Black Pollen Negative    27. Red Mulberry Negative    28. Ash Mix Negative    29. Birch Mix Negative    30. Beech American Negative    31. Cottonwood, Eastern Negative    32. Hickory, White Negative    33. Maple Mix Negative    34. Oak, Eastern Mix Negative    35. Sycamore Eastern Negative    36. Alternaria Alternata Negative    37. Cladosporium Herbarum Negative    38. Aspergillus Mix Negative    39. Penicillium Mix Negative    40. Bipolaris Sorokiniana (Helminthosporium) Negative    41. Drechslera Spicifera (Curvularia) Negative    42. Mucor Plumbeus Negative    43. Fusarium Moniliforme Negative     44. Aureobasidium Pullulans (pullulara) Negative    45. Rhizopus Oryzae Negative    46. Botrytis Cinera Negative    47. Epicoccum Nigrum Negative    48. Phoma Betae Negative    49. Dust Mite Mix Negative    50. Cat Hair 10,000 BAU/ml Negative    51.  Dog Epithelia Negative    52. Mixed Feathers Negative    53. Horse Epithelia Negative    54. Cockroach, German Negative    55. Tobacco Leaf Negative          Allergy  testing results were read and interpreted by myself, documented by clinical staff.  Patient provided with copy of allergy  testing along with avoidance measures when indicated.   Hargis Springer, MD  [facility]        "

## 2024-03-07 ENCOUNTER — Other Ambulatory Visit: Payer: Self-pay | Admitting: Family Medicine

## 2024-03-07 DIAGNOSIS — I10 Essential (primary) hypertension: Secondary | ICD-10-CM

## 2024-03-10 ENCOUNTER — Other Ambulatory Visit: Payer: Self-pay | Admitting: Family Medicine

## 2024-03-10 DIAGNOSIS — G4733 Obstructive sleep apnea (adult) (pediatric): Secondary | ICD-10-CM

## 2024-03-18 ENCOUNTER — Telehealth: Payer: Self-pay | Admitting: Family Medicine

## 2024-03-18 NOTE — Telephone Encounter (Signed)
 Copied from CRM #[id number]. Topic: General - Other >> Mar 18, 2024  4:18 PM China J wrote: Reason for CRM: Shariff calling from Dr. Norleen Rasmussen office, wanting to verify if their fax came through to the clinic. He would like if we could keep a lookout for the paperwork.

## 2024-03-21 ENCOUNTER — Other Ambulatory Visit: Payer: Self-pay | Admitting: Family Medicine

## 2024-03-21 DIAGNOSIS — I10 Essential (primary) hypertension: Secondary | ICD-10-CM

## 2024-03-22 ENCOUNTER — Telehealth: Payer: Self-pay

## 2024-03-22 ENCOUNTER — Other Ambulatory Visit (HOSPITAL_COMMUNITY): Payer: Self-pay

## 2024-03-23 ENCOUNTER — Other Ambulatory Visit (HOSPITAL_COMMUNITY): Payer: Self-pay

## 2024-03-23 NOTE — Telephone Encounter (Signed)
 Have not seen in [address].

## 2024-03-25 NOTE — Telephone Encounter (Signed)
 Sent error for crm- no number given to call office back

## 2024-03-26 NOTE — Telephone Encounter (Signed)
 Contacts  Contact Date/Time Type Contact Phone/Fax  03/18/2024 04:14 PM EST Phone Copywriter, [employer]vertising) [employer] (Other) [phone]

## 2024-03-26 NOTE — Telephone Encounter (Signed)
 Called and spoke with [facility] and advised that we did not receive fax and line disconnected. Called back again and left message to resend it.

## 2024-03-30 ENCOUNTER — Ambulatory Visit: Payer: Medicare Other

## 2024-03-31 ENCOUNTER — Ambulatory Visit

## 2024-05-19 ENCOUNTER — Ambulatory Visit: Admitting: Orthopaedic Surgery

## 2024-10-04 ENCOUNTER — Ambulatory Visit: Admitting: Family Medicine
